# Patient Record
Sex: Male | Born: 1945
Health system: Southern US, Community
[De-identification: ages and names within clinical notes are randomized; demographics above are authoritative.]

## PROBLEM LIST (undated history)

## (undated) DIAGNOSIS — Z9889 Other specified postprocedural states: Secondary | ICD-10-CM

## (undated) DIAGNOSIS — I639 Cerebral infarction, unspecified: Secondary | ICD-10-CM

## (undated) DIAGNOSIS — I350 Nonrheumatic aortic (valve) stenosis: Secondary | ICD-10-CM

## (undated) DIAGNOSIS — E785 Hyperlipidemia, unspecified: Secondary | ICD-10-CM

## (undated) DIAGNOSIS — Z952 Presence of prosthetic heart valve: Secondary | ICD-10-CM

## (undated) DIAGNOSIS — I2510 Atherosclerotic heart disease of native coronary artery without angina pectoris: Secondary | ICD-10-CM

## (undated) DIAGNOSIS — R011 Cardiac murmur, unspecified: Secondary | ICD-10-CM

## (undated) DIAGNOSIS — Z8619 Personal history of other infectious and parasitic diseases: Secondary | ICD-10-CM

## (undated) DIAGNOSIS — T8859XA Other complications of anesthesia, initial encounter: Secondary | ICD-10-CM

## (undated) DIAGNOSIS — R112 Nausea with vomiting, unspecified: Secondary | ICD-10-CM

## (undated) DIAGNOSIS — T7840XA Allergy, unspecified, initial encounter: Secondary | ICD-10-CM

## (undated) DIAGNOSIS — M1990 Unspecified osteoarthritis, unspecified site: Secondary | ICD-10-CM

## (undated) DIAGNOSIS — E1165 Type 2 diabetes mellitus with hyperglycemia: Secondary | ICD-10-CM

## (undated) DIAGNOSIS — N529 Male erectile dysfunction, unspecified: Secondary | ICD-10-CM

## (undated) DIAGNOSIS — C801 Malignant (primary) neoplasm, unspecified: Secondary | ICD-10-CM

## (undated) DIAGNOSIS — N189 Chronic kidney disease, unspecified: Secondary | ICD-10-CM

## (undated) DIAGNOSIS — E119 Type 2 diabetes mellitus without complications: Secondary | ICD-10-CM

## (undated) DIAGNOSIS — I10 Essential (primary) hypertension: Secondary | ICD-10-CM

## (undated) DIAGNOSIS — G4730 Sleep apnea, unspecified: Secondary | ICD-10-CM

## (undated) DIAGNOSIS — E669 Obesity, unspecified: Secondary | ICD-10-CM

## (undated) DIAGNOSIS — Z8739 Personal history of other diseases of the musculoskeletal system and connective tissue: Secondary | ICD-10-CM

## (undated) HISTORY — DX: Personal history of other infectious and parasitic diseases: Z86.19

## (undated) HISTORY — DX: Malignant (primary) neoplasm, unspecified: C80.1

## (undated) HISTORY — DX: Cardiac murmur, unspecified: R01.1

## (undated) HISTORY — DX: Atherosclerotic heart disease of native coronary artery without angina pectoris: I25.10

## (undated) HISTORY — DX: Hyperlipidemia, unspecified: E78.5

## (undated) HISTORY — DX: Allergy, unspecified, initial encounter: T78.40XA

## (undated) HISTORY — DX: Type 2 diabetes mellitus with hyperglycemia: E11.65

## (undated) HISTORY — DX: Type 2 diabetes mellitus without complications: E11.9

## (undated) HISTORY — PX: TONSILLECTOMY: SHX5217

## (undated) HISTORY — DX: Obesity, unspecified: E66.9

## (undated) HISTORY — DX: Nonrheumatic aortic (valve) stenosis: I35.0

## (undated) HISTORY — DX: Hemochromatosis, unspecified: E83.119

## (undated) HISTORY — DX: Presence of prosthetic heart valve: Z95.2

## (undated) HISTORY — DX: Essential (primary) hypertension: I10

## (undated) HISTORY — DX: Personal history of other diseases of the musculoskeletal system and connective tissue: Z87.39

## (undated) HISTORY — DX: Male erectile dysfunction, unspecified: N52.9

---
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
## 1972-01-31 DIAGNOSIS — Z8619 Personal history of other infectious and parasitic diseases: Secondary | ICD-10-CM

## 1972-01-31 HISTORY — DX: Personal history of other infectious and parasitic diseases: Z86.19

## 1988-01-31 HISTORY — PX: CHOLECYSTECTOMY: SHX55

## 1988-01-31 HISTORY — PX: APPENDECTOMY: SHX54

## 1997-01-30 HISTORY — PX: HERNIA REPAIR: SHX51

## 2000-01-31 HISTORY — PX: HERNIA REPAIR: SHX51

## 2003-10-14 ENCOUNTER — Encounter: Payer: Self-pay | Admitting: Cardiology

## 2003-10-22 ENCOUNTER — Ambulatory Visit: Payer: Self-pay | Admitting: Cardiology

## 2003-10-31 HISTORY — PX: CORONARY ARTERY BYPASS GRAFT: SHX141

## 2003-11-02 ENCOUNTER — Encounter: Admission: RE | Admit: 2003-11-02 | Discharge: 2003-11-02 | Payer: Self-pay | Admitting: Dentistry

## 2003-11-02 ENCOUNTER — Ambulatory Visit: Payer: Self-pay | Admitting: Dentistry

## 2003-11-05 ENCOUNTER — Ambulatory Visit (HOSPITAL_COMMUNITY): Admission: RE | Admit: 2003-11-05 | Discharge: 2003-11-05 | Payer: Self-pay | Admitting: Dentistry

## 2003-11-10 ENCOUNTER — Ambulatory Visit (HOSPITAL_COMMUNITY): Admission: RE | Admit: 2003-11-10 | Discharge: 2003-11-10 | Payer: Self-pay | Admitting: Cardiology

## 2003-11-10 ENCOUNTER — Encounter: Payer: Self-pay | Admitting: Cardiology

## 2003-11-11 ENCOUNTER — Encounter (INDEPENDENT_AMBULATORY_CARE_PROVIDER_SITE_OTHER): Payer: Self-pay | Admitting: Specialist

## 2003-11-11 ENCOUNTER — Inpatient Hospital Stay (HOSPITAL_COMMUNITY): Admission: RE | Admit: 2003-11-11 | Discharge: 2003-11-18 | Payer: Self-pay | Admitting: Cardiothoracic Surgery

## 2003-11-14 HISTORY — PX: AORTIC VALVE REPLACEMENT: SHX41

## 2003-11-27 ENCOUNTER — Inpatient Hospital Stay (HOSPITAL_COMMUNITY): Admission: EM | Admit: 2003-11-27 | Discharge: 2003-11-28 | Payer: Self-pay | Admitting: *Deleted

## 2003-11-29 ENCOUNTER — Emergency Department (HOSPITAL_COMMUNITY): Admission: EM | Admit: 2003-11-29 | Discharge: 2003-11-29 | Payer: Self-pay | Admitting: Emergency Medicine

## 2003-12-01 ENCOUNTER — Ambulatory Visit: Payer: Self-pay

## 2003-12-02 ENCOUNTER — Ambulatory Visit (HOSPITAL_COMMUNITY): Admission: RE | Admit: 2003-12-02 | Discharge: 2003-12-02 | Payer: Self-pay | Admitting: Cardiology

## 2003-12-02 ENCOUNTER — Ambulatory Visit: Payer: Self-pay | Admitting: *Deleted

## 2003-12-10 ENCOUNTER — Ambulatory Visit: Payer: Self-pay | Admitting: Cardiology

## 2003-12-21 ENCOUNTER — Ambulatory Visit: Payer: Self-pay

## 2003-12-21 ENCOUNTER — Ambulatory Visit: Payer: Self-pay | Admitting: Internal Medicine

## 2003-12-22 ENCOUNTER — Ambulatory Visit: Payer: Self-pay | Admitting: Internal Medicine

## 2003-12-28 ENCOUNTER — Ambulatory Visit: Payer: Self-pay | Admitting: Internal Medicine

## 2003-12-31 ENCOUNTER — Encounter: Admission: RE | Admit: 2003-12-31 | Discharge: 2003-12-31 | Payer: Self-pay | Admitting: Cardiothoracic Surgery

## 2004-01-04 ENCOUNTER — Ambulatory Visit: Payer: Self-pay | Admitting: Cardiology

## 2004-01-05 ENCOUNTER — Ambulatory Visit: Payer: Self-pay | Admitting: Internal Medicine

## 2004-01-05 ENCOUNTER — Encounter: Admission: RE | Admit: 2004-01-05 | Discharge: 2004-01-05 | Payer: Self-pay | Admitting: Internal Medicine

## 2004-01-07 ENCOUNTER — Ambulatory Visit: Payer: Self-pay | Admitting: Cardiology

## 2004-01-11 ENCOUNTER — Ambulatory Visit: Payer: Self-pay | Admitting: Cardiology

## 2004-01-20 ENCOUNTER — Ambulatory Visit: Payer: Self-pay | Admitting: *Deleted

## 2004-01-31 HISTORY — PX: HIP SURGERY: SHX245

## 2004-02-04 ENCOUNTER — Ambulatory Visit: Payer: Self-pay | Admitting: Internal Medicine

## 2004-02-11 ENCOUNTER — Ambulatory Visit: Payer: Self-pay | Admitting: Internal Medicine

## 2004-02-24 ENCOUNTER — Ambulatory Visit: Payer: Self-pay | Admitting: Dentistry

## 2004-02-25 ENCOUNTER — Ambulatory Visit: Payer: Self-pay | Admitting: Cardiology

## 2004-02-29 ENCOUNTER — Ambulatory Visit: Payer: Self-pay | Admitting: Internal Medicine

## 2004-03-14 ENCOUNTER — Ambulatory Visit: Payer: Self-pay | Admitting: Cardiology

## 2004-03-17 ENCOUNTER — Ambulatory Visit: Payer: Self-pay | Admitting: Internal Medicine

## 2004-03-24 ENCOUNTER — Ambulatory Visit (HOSPITAL_COMMUNITY): Admission: RE | Admit: 2004-03-24 | Discharge: 2004-03-24 | Payer: Self-pay | Admitting: Orthopedic Surgery

## 2004-05-30 ENCOUNTER — Ambulatory Visit: Payer: Self-pay | Admitting: Cardiology

## 2004-06-01 ENCOUNTER — Ambulatory Visit (HOSPITAL_COMMUNITY): Admission: RE | Admit: 2004-06-01 | Discharge: 2004-06-01 | Payer: Self-pay | Admitting: Orthopedic Surgery

## 2004-06-09 ENCOUNTER — Ambulatory Visit: Payer: Self-pay | Admitting: Internal Medicine

## 2004-06-20 ENCOUNTER — Ambulatory Visit: Payer: Self-pay | Admitting: Cardiology

## 2004-06-29 ENCOUNTER — Ambulatory Visit: Payer: Self-pay | Admitting: Cardiology

## 2004-07-13 ENCOUNTER — Ambulatory Visit: Payer: Self-pay | Admitting: *Deleted

## 2004-07-22 ENCOUNTER — Ambulatory Visit: Payer: Self-pay | Admitting: Cardiology

## 2004-07-25 ENCOUNTER — Ambulatory Visit: Payer: Self-pay | Admitting: Internal Medicine

## 2004-08-10 ENCOUNTER — Ambulatory Visit: Payer: Self-pay | Admitting: Cardiology

## 2004-08-31 ENCOUNTER — Ambulatory Visit: Payer: Self-pay | Admitting: *Deleted

## 2004-09-07 ENCOUNTER — Ambulatory Visit: Payer: Self-pay | Admitting: Cardiology

## 2004-09-15 ENCOUNTER — Ambulatory Visit: Payer: Self-pay | Admitting: Internal Medicine

## 2004-09-15 ENCOUNTER — Ambulatory Visit: Payer: Self-pay | Admitting: Cardiology

## 2004-10-06 ENCOUNTER — Ambulatory Visit: Payer: Self-pay | Admitting: Internal Medicine

## 2004-11-03 ENCOUNTER — Ambulatory Visit: Payer: Self-pay | Admitting: Cardiology

## 2004-11-04 ENCOUNTER — Ambulatory Visit: Payer: Self-pay | Admitting: Cardiology

## 2004-12-07 ENCOUNTER — Ambulatory Visit: Payer: Self-pay | Admitting: Cardiology

## 2004-12-28 ENCOUNTER — Ambulatory Visit: Payer: Self-pay | Admitting: Cardiology

## 2005-01-05 ENCOUNTER — Ambulatory Visit: Payer: Self-pay | Admitting: Cardiology

## 2005-01-18 ENCOUNTER — Ambulatory Visit: Payer: Self-pay | Admitting: Internal Medicine

## 2005-02-01 ENCOUNTER — Ambulatory Visit: Payer: Self-pay | Admitting: Cardiology

## 2005-02-15 ENCOUNTER — Ambulatory Visit: Payer: Self-pay | Admitting: Cardiology

## 2005-03-02 ENCOUNTER — Ambulatory Visit: Payer: Self-pay | Admitting: Cardiology

## 2005-03-22 ENCOUNTER — Ambulatory Visit: Payer: Self-pay | Admitting: Cardiology

## 2005-03-29 ENCOUNTER — Ambulatory Visit: Payer: Self-pay | Admitting: Cardiology

## 2005-04-05 ENCOUNTER — Ambulatory Visit: Payer: Self-pay | Admitting: Cardiology

## 2005-04-26 ENCOUNTER — Ambulatory Visit: Payer: Self-pay | Admitting: *Deleted

## 2005-05-24 ENCOUNTER — Ambulatory Visit: Payer: Self-pay | Admitting: *Deleted

## 2005-06-21 ENCOUNTER — Ambulatory Visit: Payer: Self-pay | Admitting: Cardiology

## 2005-07-18 ENCOUNTER — Ambulatory Visit: Payer: Self-pay | Admitting: Cardiology

## 2005-08-15 ENCOUNTER — Ambulatory Visit: Payer: Self-pay | Admitting: Cardiology

## 2005-09-05 ENCOUNTER — Ambulatory Visit: Payer: Self-pay | Admitting: Cardiology

## 2005-10-03 ENCOUNTER — Ambulatory Visit: Payer: Self-pay | Admitting: Cardiovascular Disease

## 2005-10-30 ENCOUNTER — Ambulatory Visit: Payer: Self-pay | Admitting: Cardiology

## 2005-11-24 ENCOUNTER — Ambulatory Visit: Payer: Self-pay | Admitting: Internal Medicine

## 2005-12-25 ENCOUNTER — Ambulatory Visit: Payer: Self-pay | Admitting: Cardiology

## 2006-01-24 ENCOUNTER — Ambulatory Visit: Payer: Self-pay | Admitting: Cardiology

## 2006-03-02 ENCOUNTER — Ambulatory Visit: Payer: Self-pay | Admitting: Internal Medicine

## 2006-04-04 ENCOUNTER — Ambulatory Visit: Payer: Self-pay | Admitting: *Deleted

## 2006-04-04 ENCOUNTER — Ambulatory Visit: Payer: Self-pay | Admitting: Cardiology

## 2006-05-01 ENCOUNTER — Ambulatory Visit: Payer: Self-pay | Admitting: Internal Medicine

## 2006-05-01 ENCOUNTER — Ambulatory Visit: Payer: Self-pay | Admitting: Cardiology

## 2006-05-01 LAB — CONVERTED CEMR LAB
ALT: 34 units/L (ref 0–40)
AST: 32 units/L (ref 0–37)
Albumin: 4.1 g/dL (ref 3.5–5.2)
Alkaline Phosphatase: 55 units/L (ref 39–117)
Bilirubin, Direct: 0.1 mg/dL (ref 0.0–0.3)
Cholesterol: 127 mg/dL (ref 0–200)
Direct LDL: 70 mg/dL
HDL: 34.9 mg/dL — ABNORMAL LOW (ref >39.0)
Total Bilirubin: 0.8 mg/dL (ref 0.3–1.2)
Total CHOL/HDL Ratio: 3.6
Total Protein: 7.4 g/dL (ref 6.0–8.3)
Triglycerides: 263 mg/dL (ref 0–149)
VLDL: 53 mg/dL — ABNORMAL HIGH (ref 0–40)

## 2006-05-28 ENCOUNTER — Ambulatory Visit: Payer: Self-pay | Admitting: Cardiology

## 2006-06-26 ENCOUNTER — Ambulatory Visit: Payer: Self-pay | Admitting: Cardiology

## 2006-07-24 ENCOUNTER — Ambulatory Visit: Payer: Self-pay | Admitting: Cardiology

## 2006-08-14 ENCOUNTER — Ambulatory Visit: Payer: Self-pay | Admitting: Cardiovascular Disease

## 2006-09-06 ENCOUNTER — Ambulatory Visit: Payer: Self-pay | Admitting: Cardiovascular Disease

## 2006-10-03 ENCOUNTER — Ambulatory Visit: Payer: Self-pay | Admitting: Cardiology

## 2006-10-03 ENCOUNTER — Ambulatory Visit: Payer: Self-pay | Admitting: Internal Medicine

## 2006-10-15 ENCOUNTER — Ambulatory Visit: Payer: Self-pay | Admitting: Internal Medicine

## 2006-11-13 ENCOUNTER — Ambulatory Visit: Payer: Self-pay | Admitting: Cardiology

## 2006-12-10 ENCOUNTER — Ambulatory Visit: Payer: Self-pay | Admitting: Internal Medicine

## 2007-01-08 ENCOUNTER — Ambulatory Visit: Payer: Self-pay | Admitting: Cardiology

## 2007-02-05 ENCOUNTER — Ambulatory Visit: Payer: Self-pay | Admitting: Cardiology

## 2007-03-05 ENCOUNTER — Ambulatory Visit: Payer: Self-pay | Admitting: Cardiovascular Disease

## 2007-03-22 ENCOUNTER — Ambulatory Visit: Payer: Self-pay | Admitting: Cardiology

## 2007-04-19 ENCOUNTER — Ambulatory Visit: Payer: Self-pay | Admitting: Cardiovascular Disease

## 2007-05-15 ENCOUNTER — Ambulatory Visit: Payer: Self-pay | Admitting: Cardiology

## 2007-05-21 ENCOUNTER — Ambulatory Visit: Payer: Self-pay | Admitting: Cardiology

## 2007-05-21 LAB — CONVERTED CEMR LAB
Albumin: 4 g/dL (ref 3.5–5.2)
Bilirubin, Direct: 0.1 mg/dL (ref 0.0–0.3)
Calcium: 9.2 mg/dL (ref 8.4–10.5)
Creatinine, Ser: 1.2 mg/dL (ref 0.4–1.5)
GFR calc Af Amer: 79 mL/min
Glucose, Bld: 127 mg/dL — ABNORMAL HIGH (ref 70–99)
HDL: 28.7 mg/dL — ABNORMAL LOW (ref >39.0)
Sodium: 140 meq/L (ref 135–145)
Total Protein: 7.4 g/dL (ref 6.0–8.3)
VLDL: 39 mg/dL (ref 0–40)

## 2007-05-31 ENCOUNTER — Ambulatory Visit: Payer: Self-pay | Admitting: Internal Medicine

## 2007-06-03 ENCOUNTER — Ambulatory Visit: Payer: Self-pay | Admitting: Internal Medicine

## 2007-06-11 ENCOUNTER — Encounter: Admission: RE | Admit: 2007-06-11 | Discharge: 2007-06-11 | Payer: Self-pay | Admitting: Otolaryngology

## 2007-06-11 ENCOUNTER — Ambulatory Visit: Payer: Self-pay | Admitting: Cardiology

## 2007-06-17 ENCOUNTER — Ambulatory Visit: Payer: Self-pay | Admitting: Cardiology

## 2007-07-01 ENCOUNTER — Ambulatory Visit: Payer: Self-pay | Admitting: Cardiovascular Disease

## 2007-07-07 ENCOUNTER — Encounter: Payer: Self-pay | Admitting: Internal Medicine

## 2007-07-14 ENCOUNTER — Encounter: Payer: Self-pay | Admitting: Internal Medicine

## 2007-07-15 ENCOUNTER — Ambulatory Visit: Payer: Self-pay | Admitting: Internal Medicine

## 2007-07-22 ENCOUNTER — Ambulatory Visit: Payer: Self-pay | Admitting: Internal Medicine

## 2007-08-06 ENCOUNTER — Ambulatory Visit: Payer: Self-pay | Admitting: Cardiovascular Disease

## 2007-08-08 ENCOUNTER — Encounter: Payer: Self-pay | Admitting: Internal Medicine

## 2007-08-12 ENCOUNTER — Encounter: Payer: Self-pay | Admitting: Internal Medicine

## 2007-08-20 ENCOUNTER — Ambulatory Visit: Payer: Self-pay | Admitting: Cardiology

## 2007-09-06 ENCOUNTER — Ambulatory Visit: Payer: Self-pay | Admitting: Cardiology

## 2007-09-24 ENCOUNTER — Ambulatory Visit: Payer: Self-pay | Admitting: Cardiology

## 2007-10-02 ENCOUNTER — Encounter: Payer: Self-pay | Admitting: Internal Medicine

## 2007-10-08 ENCOUNTER — Ambulatory Visit: Payer: Self-pay | Admitting: Cardiology

## 2007-10-18 ENCOUNTER — Ambulatory Visit: Payer: Self-pay | Admitting: Internal Medicine

## 2007-10-29 ENCOUNTER — Ambulatory Visit: Payer: Self-pay | Admitting: Cardiology

## 2007-11-19 ENCOUNTER — Ambulatory Visit: Payer: Self-pay | Admitting: Internal Medicine

## 2007-12-17 ENCOUNTER — Ambulatory Visit: Payer: Self-pay | Admitting: Cardiovascular Disease

## 2008-01-14 ENCOUNTER — Ambulatory Visit: Payer: Self-pay | Admitting: Cardiology

## 2008-02-04 ENCOUNTER — Ambulatory Visit: Payer: Self-pay | Admitting: Internal Medicine

## 2008-02-28 ENCOUNTER — Ambulatory Visit: Payer: Self-pay | Admitting: Internal Medicine

## 2008-03-20 ENCOUNTER — Ambulatory Visit: Payer: Self-pay | Admitting: Internal Medicine

## 2008-04-17 ENCOUNTER — Ambulatory Visit: Payer: Self-pay | Admitting: Cardiology

## 2008-04-29 DIAGNOSIS — I25810 Atherosclerosis of coronary artery bypass graft(s) without angina pectoris: Secondary | ICD-10-CM

## 2008-04-29 DIAGNOSIS — I359 Nonrheumatic aortic valve disorder, unspecified: Secondary | ICD-10-CM | POA: Insufficient documentation

## 2008-04-29 DIAGNOSIS — E785 Hyperlipidemia, unspecified: Secondary | ICD-10-CM

## 2008-04-29 DIAGNOSIS — E669 Obesity, unspecified: Secondary | ICD-10-CM

## 2008-04-29 DIAGNOSIS — M109 Gout, unspecified: Secondary | ICD-10-CM | POA: Insufficient documentation

## 2008-04-29 HISTORY — DX: Obesity, unspecified: E66.9

## 2008-05-06 ENCOUNTER — Encounter: Payer: Self-pay | Admitting: Cardiology

## 2008-05-06 ENCOUNTER — Ambulatory Visit: Payer: Self-pay | Admitting: Cardiology

## 2008-05-06 ENCOUNTER — Ambulatory Visit: Payer: Self-pay | Admitting: Internal Medicine

## 2008-05-06 DIAGNOSIS — I421 Obstructive hypertrophic cardiomyopathy: Secondary | ICD-10-CM

## 2008-05-06 HISTORY — DX: Obstructive hypertrophic cardiomyopathy: I42.1

## 2008-05-27 ENCOUNTER — Ambulatory Visit: Payer: Self-pay | Admitting: Internal Medicine

## 2008-05-27 ENCOUNTER — Ambulatory Visit: Payer: Self-pay

## 2008-05-27 ENCOUNTER — Encounter: Payer: Self-pay | Admitting: Cardiology

## 2008-05-27 ENCOUNTER — Ambulatory Visit: Payer: Self-pay | Admitting: Cardiology

## 2008-06-02 LAB — CONVERTED CEMR LAB
Alkaline Phosphatase: 56 units/L (ref 39–117)
Bilirubin, Direct: 0.1 mg/dL (ref 0.0–0.3)
Direct LDL: 58 mg/dL
HDL: 30.3 mg/dL — ABNORMAL LOW (ref >39.00)
Total Bilirubin: 1 mg/dL (ref 0.3–1.2)
Total CHOL/HDL Ratio: 4
Total Protein: 6.9 g/dL (ref 6.0–8.3)
VLDL: 46.2 mg/dL — ABNORMAL HIGH (ref 0.0–40.0)

## 2008-06-24 ENCOUNTER — Ambulatory Visit: Payer: Self-pay | Admitting: Cardiology

## 2008-06-30 ENCOUNTER — Encounter: Payer: Self-pay | Admitting: *Deleted

## 2008-07-28 ENCOUNTER — Ambulatory Visit: Payer: Self-pay | Admitting: Internal Medicine

## 2008-07-28 LAB — CONVERTED CEMR LAB: POC INR: 2.6

## 2008-08-05 ENCOUNTER — Encounter: Payer: Self-pay | Admitting: *Deleted

## 2008-08-25 ENCOUNTER — Ambulatory Visit: Payer: Self-pay | Admitting: Internal Medicine

## 2008-09-22 ENCOUNTER — Ambulatory Visit: Payer: Self-pay | Admitting: Internal Medicine

## 2008-10-28 ENCOUNTER — Ambulatory Visit: Payer: Self-pay | Admitting: Cardiovascular Disease

## 2008-11-26 ENCOUNTER — Ambulatory Visit: Payer: Self-pay | Admitting: Cardiology

## 2008-11-26 LAB — CONVERTED CEMR LAB: POC INR: 2.7

## 2008-12-22 ENCOUNTER — Ambulatory Visit: Payer: Self-pay | Admitting: Cardiovascular Disease

## 2008-12-22 LAB — CONVERTED CEMR LAB: POC INR: 2.2

## 2009-01-19 ENCOUNTER — Ambulatory Visit: Payer: Self-pay | Admitting: Internal Medicine

## 2009-01-19 LAB — CONVERTED CEMR LAB: POC INR: 2.2

## 2009-01-30 DIAGNOSIS — C801 Malignant (primary) neoplasm, unspecified: Secondary | ICD-10-CM

## 2009-01-30 HISTORY — DX: Malignant (primary) neoplasm, unspecified: C80.1

## 2009-02-17 ENCOUNTER — Ambulatory Visit: Payer: Self-pay | Admitting: Cardiovascular Disease

## 2009-02-17 LAB — CONVERTED CEMR LAB: POC INR: 3

## 2009-03-09 ENCOUNTER — Telehealth (INDEPENDENT_AMBULATORY_CARE_PROVIDER_SITE_OTHER): Payer: Self-pay | Admitting: *Deleted

## 2009-03-17 ENCOUNTER — Ambulatory Visit: Payer: Self-pay | Admitting: Internal Medicine

## 2009-03-17 LAB — CONVERTED CEMR LAB: POC INR: 2.5

## 2009-04-14 ENCOUNTER — Ambulatory Visit: Payer: Self-pay

## 2009-04-14 LAB — CONVERTED CEMR LAB: POC INR: 2.7

## 2009-05-18 ENCOUNTER — Ambulatory Visit: Payer: Self-pay | Admitting: Cardiology

## 2009-05-18 ENCOUNTER — Ambulatory Visit: Payer: Self-pay | Admitting: Cardiovascular Disease

## 2009-05-18 DIAGNOSIS — N529 Male erectile dysfunction, unspecified: Secondary | ICD-10-CM

## 2009-05-18 HISTORY — DX: Male erectile dysfunction, unspecified: N52.9

## 2009-06-15 ENCOUNTER — Ambulatory Visit: Payer: Self-pay | Admitting: Cardiology

## 2009-06-15 DIAGNOSIS — I2510 Atherosclerotic heart disease of native coronary artery without angina pectoris: Secondary | ICD-10-CM

## 2009-06-15 HISTORY — DX: Atherosclerotic heart disease of native coronary artery without angina pectoris: I25.10

## 2009-06-15 LAB — CONVERTED CEMR LAB: POC INR: 2.2

## 2009-06-16 ENCOUNTER — Ambulatory Visit: Payer: Self-pay | Admitting: Internal Medicine

## 2009-06-16 ENCOUNTER — Encounter: Payer: Self-pay | Admitting: Family

## 2009-06-16 DIAGNOSIS — H903 Sensorineural hearing loss, bilateral: Secondary | ICD-10-CM

## 2009-06-16 DIAGNOSIS — I10 Essential (primary) hypertension: Secondary | ICD-10-CM

## 2009-06-16 DIAGNOSIS — F17200 Nicotine dependence, unspecified, uncomplicated: Secondary | ICD-10-CM

## 2009-06-16 DIAGNOSIS — H905 Unspecified sensorineural hearing loss: Secondary | ICD-10-CM | POA: Insufficient documentation

## 2009-06-16 HISTORY — DX: Nicotine dependence, unspecified, uncomplicated: F17.200

## 2009-06-16 HISTORY — DX: Sensorineural hearing loss, bilateral: H90.3

## 2009-06-16 HISTORY — DX: Essential (primary) hypertension: I10

## 2009-06-16 LAB — CONVERTED CEMR LAB
ALT: 46 units/L (ref 0–53)
AST: 56 units/L — ABNORMAL HIGH (ref 0–37)
Alkaline Phosphatase: 66 units/L (ref 39–117)
CO2: 26 meq/L (ref 19–32)
Creatinine, Ser: 1.19 mg/dL (ref 0.40–1.50)
Sodium: 135 meq/L (ref 135–145)
TSH: 2.251 microintl units/mL (ref 0.350–4.500)
Total Bilirubin: 0.6 mg/dL (ref 0.3–1.2)
Total Protein: 7.6 g/dL (ref 6.0–8.3)

## 2009-06-17 ENCOUNTER — Telehealth: Payer: Self-pay | Admitting: Family

## 2009-06-17 ENCOUNTER — Encounter (INDEPENDENT_AMBULATORY_CARE_PROVIDER_SITE_OTHER): Payer: Self-pay | Admitting: *Deleted

## 2009-06-17 DIAGNOSIS — C61 Malignant neoplasm of prostate: Secondary | ICD-10-CM

## 2009-06-17 HISTORY — DX: Malignant neoplasm of prostate: C61

## 2009-06-18 LAB — CONVERTED CEMR LAB
Basophils Absolute: 0.1 10*3/uL (ref 0.0–0.1)
Eosinophils Absolute: 0.2 10*3/uL (ref 0.0–0.7)
HCT: 45.9 % (ref 39.0–52.0)
Hemoglobin: 15.9 g/dL (ref 13.0–17.0)
Lymphs Abs: 1.4 10*3/uL (ref 0.7–4.0)
MCHC: 34.7 g/dL (ref 30.0–36.0)
Monocytes Absolute: 0.8 10*3/uL (ref 0.1–1.0)
Monocytes Relative: 11.4 % (ref 3.0–12.0)
Neutro Abs: 4.9 10*3/uL (ref 1.4–7.7)
Platelets: 160 10*3/uL (ref 150.0–400.0)
RDW: 13.6 % (ref 11.5–14.6)

## 2009-07-01 ENCOUNTER — Ambulatory Visit: Payer: Self-pay | Admitting: Family

## 2009-07-02 ENCOUNTER — Telehealth: Payer: Self-pay | Admitting: Family

## 2009-07-06 ENCOUNTER — Ambulatory Visit: Payer: Self-pay | Admitting: Family

## 2009-07-13 ENCOUNTER — Ambulatory Visit: Payer: Self-pay

## 2009-07-13 ENCOUNTER — Ambulatory Visit: Payer: Self-pay | Admitting: Cardiovascular Disease

## 2009-07-13 ENCOUNTER — Ambulatory Visit: Payer: Self-pay | Admitting: Cardiology

## 2009-07-13 ENCOUNTER — Ambulatory Visit: Payer: Self-pay | Admitting: Internal Medicine

## 2009-07-13 ENCOUNTER — Encounter: Payer: Self-pay | Admitting: Gastroenterology

## 2009-07-13 ENCOUNTER — Ambulatory Visit (HOSPITAL_COMMUNITY): Admission: RE | Admit: 2009-07-13 | Discharge: 2009-07-13 | Payer: Self-pay | Admitting: Cardiology

## 2009-07-13 ENCOUNTER — Encounter: Payer: Self-pay | Admitting: Cardiology

## 2009-07-13 LAB — CONVERTED CEMR LAB: POC INR: 2.4

## 2009-07-27 ENCOUNTER — Encounter: Payer: Self-pay | Admitting: Gastroenterology

## 2009-07-27 ENCOUNTER — Ambulatory Visit: Payer: Self-pay | Admitting: Gastroenterology

## 2009-07-28 ENCOUNTER — Encounter: Payer: Self-pay | Admitting: Gastroenterology

## 2009-07-28 ENCOUNTER — Encounter (INDEPENDENT_AMBULATORY_CARE_PROVIDER_SITE_OTHER): Payer: Self-pay | Admitting: *Deleted

## 2009-07-28 ENCOUNTER — Telehealth (INDEPENDENT_AMBULATORY_CARE_PROVIDER_SITE_OTHER): Payer: Self-pay | Admitting: *Deleted

## 2009-08-04 ENCOUNTER — Ambulatory Visit: Payer: Self-pay | Admitting: Family

## 2009-08-10 ENCOUNTER — Ambulatory Visit: Payer: Self-pay | Admitting: Cardiovascular Disease

## 2009-08-10 LAB — CONVERTED CEMR LAB: POC INR: 2.4

## 2009-09-07 ENCOUNTER — Ambulatory Visit: Payer: Self-pay | Admitting: Cardiology

## 2009-09-07 LAB — CONVERTED CEMR LAB: POC INR: 2.5

## 2009-09-24 ENCOUNTER — Encounter (INDEPENDENT_AMBULATORY_CARE_PROVIDER_SITE_OTHER): Payer: Self-pay

## 2009-09-28 ENCOUNTER — Ambulatory Visit: Payer: Self-pay | Admitting: Gastroenterology

## 2009-10-05 ENCOUNTER — Ambulatory Visit: Payer: Self-pay | Admitting: Gastroenterology

## 2009-10-05 ENCOUNTER — Encounter: Payer: Self-pay | Admitting: Cardiology

## 2009-10-05 ENCOUNTER — Encounter: Payer: Self-pay | Admitting: Family

## 2009-10-05 LAB — HM COLONOSCOPY

## 2009-10-06 ENCOUNTER — Telehealth: Payer: Self-pay | Admitting: Cardiovascular Disease

## 2009-10-07 ENCOUNTER — Ambulatory Visit: Payer: Self-pay | Admitting: Internal Medicine

## 2009-10-07 ENCOUNTER — Telehealth: Payer: Self-pay | Admitting: Gastroenterology

## 2009-10-07 LAB — CONVERTED CEMR LAB: POC INR: 1.1

## 2009-10-11 ENCOUNTER — Telehealth (INDEPENDENT_AMBULATORY_CARE_PROVIDER_SITE_OTHER): Payer: Self-pay | Admitting: *Deleted

## 2009-10-11 ENCOUNTER — Ambulatory Visit: Payer: Self-pay | Admitting: Internal Medicine

## 2009-10-11 ENCOUNTER — Encounter: Payer: Self-pay | Admitting: Family

## 2009-10-11 LAB — CONVERTED CEMR LAB: POC INR: 1.7

## 2009-10-12 ENCOUNTER — Ambulatory Visit: Payer: Self-pay | Admitting: Gastroenterology

## 2009-10-12 LAB — CONVERTED CEMR LAB
Basophils Absolute: 0 10*3/uL (ref 0.0–0.1)
Eosinophils Relative: 1.6 % (ref 0.0–5.0)
HCT: 36.8 % — ABNORMAL LOW (ref 39.0–52.0)
Lymphs Abs: 2.5 10*3/uL (ref 0.7–4.0)
Monocytes Relative: 6.6 % (ref 3.0–12.0)
Neutrophils Relative %: 67.7 % (ref 43.0–77.0)
Platelets: 188 10*3/uL (ref 150.0–400.0)
RDW: 13.4 % (ref 11.5–14.6)
WBC: 10.4 10*3/uL (ref 4.5–10.5)

## 2009-10-13 ENCOUNTER — Ambulatory Visit: Payer: Self-pay | Admitting: Cardiology

## 2009-10-13 ENCOUNTER — Ambulatory Visit: Payer: Self-pay | Admitting: Family

## 2009-10-13 DIAGNOSIS — Z952 Presence of prosthetic heart valve: Secondary | ICD-10-CM

## 2009-10-13 DIAGNOSIS — Z954 Presence of other heart-valve replacement: Secondary | ICD-10-CM

## 2009-10-13 HISTORY — DX: Presence of prosthetic heart valve: Z95.2

## 2009-10-13 LAB — CONVERTED CEMR LAB
Creatinine, Urine: 142.5 mg/dL
Hgb A1c MFr Bld: 6.9 % — ABNORMAL HIGH (ref <5.7)
POC INR: 1.9

## 2009-10-14 ENCOUNTER — Encounter: Payer: Self-pay | Admitting: Family

## 2009-10-18 ENCOUNTER — Telehealth: Payer: Self-pay | Admitting: Family

## 2009-10-26 ENCOUNTER — Telehealth: Payer: Self-pay | Admitting: Family

## 2009-11-01 ENCOUNTER — Ambulatory Visit (HOSPITAL_COMMUNITY): Admission: RE | Admit: 2009-11-01 | Discharge: 2009-11-01 | Payer: Self-pay | Admitting: Urology

## 2009-11-03 ENCOUNTER — Encounter: Payer: Self-pay | Admitting: Family

## 2009-11-03 ENCOUNTER — Encounter: Payer: Self-pay | Admitting: Cardiology

## 2009-11-04 ENCOUNTER — Ambulatory Visit: Payer: Self-pay | Admitting: Internal Medicine

## 2009-11-23 ENCOUNTER — Ambulatory Visit: Admission: RE | Admit: 2009-11-23 | Discharge: 2009-12-27 | Payer: Self-pay | Admitting: Radiation Oncology

## 2009-11-23 ENCOUNTER — Encounter: Payer: Self-pay | Admitting: Family

## 2009-11-25 ENCOUNTER — Encounter: Payer: Self-pay | Admitting: Cardiology

## 2009-11-25 ENCOUNTER — Encounter: Payer: Self-pay | Admitting: Family

## 2009-11-26 ENCOUNTER — Ambulatory Visit: Payer: Self-pay | Admitting: Family

## 2009-11-29 ENCOUNTER — Ambulatory Visit: Payer: Self-pay | Admitting: Cardiology

## 2009-12-02 ENCOUNTER — Telehealth: Payer: Self-pay | Admitting: Cardiology

## 2009-12-06 LAB — CONVERTED CEMR LAB
ALT: 33 units/L (ref 0–53)
AST: 30 units/L (ref 0–37)
Bilirubin, Direct: 0.1 mg/dL (ref 0.0–0.3)
Total Bilirubin: 0.9 mg/dL (ref 0.3–1.2)
Total Protein: 7.4 g/dL (ref 6.0–8.3)

## 2009-12-14 ENCOUNTER — Telehealth: Payer: Self-pay | Admitting: Cardiology

## 2009-12-17 ENCOUNTER — Ambulatory Visit: Payer: Self-pay | Admitting: Cardiology

## 2009-12-17 LAB — CONVERTED CEMR LAB: POC INR: 1.5

## 2009-12-22 ENCOUNTER — Ambulatory Visit: Payer: Self-pay | Admitting: Internal Medicine

## 2010-01-19 ENCOUNTER — Ambulatory Visit: Payer: Self-pay | Admitting: Cardiology

## 2010-02-03 ENCOUNTER — Ambulatory Visit
Admission: RE | Admit: 2010-02-03 | Discharge: 2010-03-01 | Payer: Self-pay | Source: Home / Self Care | Attending: Radiation Oncology | Admitting: Radiation Oncology

## 2010-02-04 ENCOUNTER — Encounter: Payer: Self-pay | Admitting: Cardiology

## 2010-02-14 ENCOUNTER — Telehealth: Payer: Self-pay | Admitting: Family

## 2010-02-16 ENCOUNTER — Ambulatory Visit: Admission: RE | Admit: 2010-02-16 | Discharge: 2010-02-16 | Payer: Self-pay | Source: Home / Self Care

## 2010-02-20 ENCOUNTER — Encounter: Payer: Self-pay | Admitting: Orthopedic Surgery

## 2010-02-20 ENCOUNTER — Encounter: Payer: Self-pay | Admitting: Otolaryngology

## 2010-02-27 LAB — CONVERTED CEMR LAB
ALT: 43 units/L (ref 0–53)
AST: 42 units/L — ABNORMAL HIGH (ref 0–37)
Basophils Absolute: 0 10*3/uL (ref 0.0–0.1)
Bilirubin, Direct: 0.1 mg/dL (ref 0.0–0.3)
Direct LDL: 55.9 mg/dL
Eosinophils Absolute: 0.1 10*3/uL (ref 0.0–0.7)
HDL: 38.7 mg/dL — ABNORMAL LOW (ref >39.00)
Lymphocytes Relative: 19.7 % (ref 12.0–46.0)
MCHC: 35.1 g/dL (ref 30.0–36.0)
MCV: 93.3 fL (ref 78.0–100.0)
Monocytes Absolute: 0.9 10*3/uL (ref 0.1–1.0)
Neutrophils Relative %: 71.1 % (ref 43.0–77.0)
RDW: 13.1 % (ref 11.5–14.6)
Total Bilirubin: 1 mg/dL (ref 0.3–1.2)
Total Protein: 7.8 g/dL (ref 6.0–8.3)
Triglycerides: 288 mg/dL — ABNORMAL HIGH (ref 0.0–149.0)

## 2010-03-02 ENCOUNTER — Ambulatory Visit: Admission: RE | Admit: 2010-03-02 | Payer: Medicare Other | Source: Ambulatory Visit | Admitting: Radiation Oncology

## 2010-03-02 DIAGNOSIS — C61 Malignant neoplasm of prostate: Secondary | ICD-10-CM | POA: Insufficient documentation

## 2010-03-02 DIAGNOSIS — R197 Diarrhea, unspecified: Secondary | ICD-10-CM | POA: Insufficient documentation

## 2010-03-02 DIAGNOSIS — Z510 Encounter for antineoplastic radiation therapy: Secondary | ICD-10-CM | POA: Insufficient documentation

## 2010-03-02 NOTE — Procedures (Signed)
Summary: Colonoscopy  Patient: Johanna Stafford Note: All result statuses are Final unless otherwise noted.  Tests: (1) Colonoscopy (COL)   COL Colonoscopy           DONE     [facility]     [address]     [place], [place]  [address]           COLONOSCOPY PROCEDURE REPORT     PATIENT:  Valor, Quaintance  MR#:  [id number]     BIRTHDATE:  01-08-1946, 64 yrs. old  GENDER:  male     ENDOSCOPIST:  Rachael Fee, MD     REF. BY:  Alma DownsLendell Caprice, N.P.     PROCEDURE DATE:  10/05/2009     PROCEDURE:  Diagnostic Colonoscopy     ASA CLASS:  Class II     INDICATIONS:  Routine Risk Screening     MEDICATIONS:   Fentanyl 75 mcg IV, Versed 8 mg IV     DESCRIPTION OF PROCEDURE:   After the risks benefits and     alternatives of the procedure were thoroughly explained, informed     consent was obtained.  Digital rectal exam was performed and     revealed no rectal masses.   The LB [id number] endoscope     was introduced through the anus and advanced to the cecum, which     was identified by both the appendix and ileocecal valve, without     limitations.  The quality of the prep was good, using MoviPrep.     The instrument was then slowly withdrawn as the colon was fully     examined.     <<PROCEDUREIMAGES>>     FINDINGS: There was a small amount of red blood in rectum (from     recent prostate biopsy) but no active oozing (see image4).     Internal and external hemorrhoids were found.  This was otherwise a     normal examination of the colon (see image1, image3, and image5).     Retroflexed views in the rectum revealed no abnormalities.    The     scope was then withdrawn from the patient and the procedure     completed.     COMPLICATIONS:  None           ENDOSCOPIC IMPRESSION:     1) Blood in rectum from recent prostate biopsy, but no active     oozing     2) Internal and external hemorrhoids     3) Otherwise normal examination; no polyps or cancer        RECOMMENDATIONS:     1) Continue current colorectal screening recommendations for     "routine risk" patients with a repeat colonoscopy in 10 years.     2) OK to resume full anticoagulation today from a GI perspective.     Dr. Christella Hartigan will communicate these findings with Dr. Riley Kill who has     managed his blood thinners for aortic valve.           REPEAT EXAM:  10 years           ______________________________     Rachael Fee, MD           cc: Dr. Bonnee Quin; Dr. Hillis Range           n.     eSIGNED:   Rachael Fee at 10/05/2009 01:49 PM  Brodrick, Curran, [id number]  Note: An exclamation mark (!) indicates a result that was not dispersed into the flowsheet. Document Creation Date: 10/05/2009 1:50 PM _______________________________________________________________________  (1) Order result status: Final Collection or observation date-time: 10/05/2009 13:43 Requested date-time:  Receipt date-time:  Reported date-time:  Referring Physician:   Ordering Physician: Rob Bunting [phone]) Specimen Source:  Source: Launa Grill Order Number: [id number] Lab site:   Appended Document: Colonoscopy    Clinical Lists Changes  Observations: Added new observation of COLONNXTDUE: 10/2019 (10/05/2009 15:28)

## 2010-03-02 NOTE — Assessment & Plan Note (Signed)
Summary: GLUCOMETER TEACHING / TF,CMA  Nurse Visit   Impression & Recommendations:  Problem # 1:  DM (ICD-250.00) Glucometer teaching provided by CMA, Pt instructions provided as below. His updated medication list for this problem includes:    Benicar Hct 20-12.5 Mg Tabs (Olmesartan medoxomil-hctz) .Marland Kitchen... Take 1 tablet by mouth once a day    Metformin Hcl 500 Mg Tabs (Metformin hcl) ..... One tablet by mouth daily x 1 week, then increase to one tablet by mouth twice daily  Complete Medication List: 1)  Toprol Xl 25 Mg Xr24h-tab (Metoprolol succinate) .... Take 1 tablet by mouth once a day 2)  Benicar Hct 20-12.5 Mg Tabs (Olmesartan medoxomil-hctz) .... Take 1 tablet by mouth once a day 3)  Niaspan 500 Mg Cr-tabs (Niacin (antihyperlipidemic)) .... Take 1 tablet by mouth once a day at bedtime 4)  Crestor 10 Mg Tabs (Rosuvastatin calcium) .... Take one tablet by mouth daily. 5)  Jantoven 5 Mg Tabs (Warfarin sodium) .... Take as directecd 6)  Viagra 50 Mg Tabs (Sildenafil citrate) .... Take one tablet as needed 7)  Colcrys 0.6 Mg Tabs (Colchicine) .... 2 tabs by mouth x 1 at start of gout flare, may take one tablet 1 hour later as needed 8)  Metformin Hcl 500 Mg Tabs (Metformin hcl) .... One tablet by mouth daily x 1 week, then increase to one tablet by mouth twice daily 9)  Freestyle Lite Test Strp (Glucose blood) .... Test blood sugar once a day. 10)  Freestyle Lancets Misc (Lancets) .... Check blood sugar once a day.   Patient Instructions: 1)  Check sugars at least once a day. 2)  Keep a log of your sugars in a notebook along with the date and time. Bring this log to your next appointment. 3)  Check your sugar if you develops symptoms of low blood sugar (weak, sweaty, dizzy)  4)  If you develop sugar under 80 drink a glass of OJ or have a snack and recheck your sugar in 30 minutes.   5)  Goal sugars fasting are between 80 and 110, goal sugars after meals are under 150. 6)  Call us if  you have sugars less than 80 or over 300. 7)  Follow up in July as scheduled.   Allergies: No Known Drug Allergies  Orders Added: 1)  Est. Patient Level II [98119] Prescriptions: FREESTYLE LANCETS  MISC (LANCETS) Check blood sugar once a day.  #100 x 0   Entered by:   Mervin Kung CMA   Authorized by:   [name] FNP   Signed by:   [name] FNP on 07/06/2009   Method used:   Electronically to        [facility] (retail)       [address]       [place], [place]  [address]       Ph: [phone]       Fax: [phone]   RxID:   [id number] FREESTYLE LITE TEST  STRP (GLUCOSE BLOOD) Test blood sugar once a day.  #50 x 1   Entered by:   Mervin Kung CMA   Authorized by:   [name] FNP   Signed by:   [name] FNP on 07/06/2009   Method used:   Electronically to        [facility] (retail)       [address]       [place], [place]  [address]       Ph: [phone]       Fax: [phone]   RxID:   [id number]   CC: Pt here for glucometer teaching. Instructed pt on use of Freestyle Lite glucometer. Pt demonstrates understanding.  I discussed BS goals and guidelines per Song Garris's instructions and copy was given to pt. Will send rxs to pharmacy for strips and lancets. Pt. will keep follow up in 1 month.

## 2010-03-02 NOTE — Letter (Signed)
Summary: [facility] / [facility]  [facility] on [facility]s / [facility]   Imported By: Lennie Odor 08/16/2009 14:33:29  _____________________________________________________________________  External Attachment:    Type:   Image     Comment:   External Document

## 2010-03-02 NOTE — Letter (Signed)
Summary: [facility]  [facility]   Imported By: Lanelle Bal 12/17/2009 10:37:38  _____________________________________________________________________  External Attachment:    Type:   Image     Comment:   External Document

## 2010-03-02 NOTE — Progress Notes (Signed)
Summary: lab results  Phone Note Outgoing Call   Summary of Call: Pls let patient know that his A1C is 7.5- this is high.  A diagnosis of DM is 6.5 or higher- so he does have diabetes.  I would like to have him start metformin, and come in for a nurse visit to learn how to use a meter.  He should come in for an appointment in 1 month.   Initial call taken by: [name] FNP,  July 02, 2009 9:47 PM  Follow-up for Phone Call        Left message with pt's son to have pt return my call. Mervin Kung CMA  July 05, 2009 10:15 AM   Pt informed of results per [facility] instructions.  Nurse visit set for 07/06/09 @ 11:30am.  33mf/u scheduled for 08/04/09/ @ 8:15 am.  Mervin Kung CMA  July 05, 2009 11:10 AM     New/Updated Medications: METFORMIN HCL 500 MG TABS (METFORMIN HCL) one tablet by mouth daily x 1 week, then increase to one tablet by mouth twice daily Prescriptions: METFORMIN HCL 500 MG TABS (METFORMIN HCL) one tablet by mouth daily x 1 week, then increase to one tablet by mouth twice daily  #60 x 1   Entered and Authorized by:   [name] FNP   Signed by:   [name] FNP on 07/02/2009   Method used:   Electronically to        [facility] (retail)       [address]       [place], [place]  [address]       Ph: [phone]       Fax: [phone]   RxID:   [id number]

## 2010-03-02 NOTE — Progress Notes (Signed)
Summary: Coumadin instructions after procedure  Phone Note Outgoing Call   Call placed by: Weston Brass PharmD,  December 14, 2009 4:58 PM Call placed to: Patient Summary of Call: Called and spoke with pt.  He has had very little bleeding since seed implants this AM.  Okay with MD to restart anticoagulation today.  Plan:  Coumadin 10mg  x 2 days then resume normal dose.  Restart Lovenox 150mg  once daily on 11/16.  F/u INR on 11/18.  Initial call taken by: Weston Brass PharmD,  December 14, 2009 5:02 PM    Prescriptions: ENOXAPARIN SODIUM 150 MG/ML SOLN (ENOXAPARIN SODIUM) Inject 1 syringe subcutaneously once daily as directed  #6 x 0   Entered by:   Weston Brass PharmD   Authorized by:   Ronaldo Miyamoto, MD, [facility]   Signed by:   Weston Brass PharmD on 12/14/2009   Method used:   Electronically to        [facility] (retail)       [address]       [place], [place]  [address]       Ph: [phone]       Fax: [phone]   RxID:   [id number]

## 2010-03-02 NOTE — Progress Notes (Signed)
Summary: Blood Pressure readings  Phone Note Call from Patient Call back at [phone]   Caller: Patient Call For: [name] FNP Summary of Call: patient calling to report his blood pressure readings,  9/25- 162/88 p 80  157/88 p 75 9/26 175/91 p 78 155/88 p 76 9/27 152/81 p78.  He would like to know if he should go to a whole pill. He states that it is okay to leave a detailed mesage if there is no  answer. Initial call taken by: Glendell Docker CMA,  October 26, 2009 8:40 AM  Follow-up for Phone Call        Yes, he should start full tablet of Benicar HCT.  Follow up in the office in 1 month. Follow-up by: [name] FNP,  October 26, 2009 10:51 AM  Additional Follow-up for Phone Call Additional follow up Details #1::        Left detailed message on pt's cell re: [name]'s instructions. Appt has been scheduled for 11/24/09 @ 8:30. Advised pt to call if he needs to r/s appt.  Appt reminder was mailed to pt. Nicki Guadalajara Fergerson CMA [name]  October 26, 2009 11:50 AM     New/Updated Medications: BENICAR HCT 20-12.5 MG TABS (OLMESARTAN MEDOXOMIL-HCTZ) Take 1 tablet by mouth once a day

## 2010-03-02 NOTE — Progress Notes (Signed)
Summary: rectal bleeding  Phone Note Call from Patient   Caller: Patient Summary of Call: Pt is calling because he is having BRB per rectum since colon, was clear drainage now blood independant of stool.  Feels weak.  No extender appt available.  Pt scheduled with Dr Christella Hartigan on 10/12/09  CBC to be done just before Initial call taken by: Chales Abrahams CMA [name],  October 11, 2009 12:41 PM

## 2010-03-02 NOTE — Letter (Signed)
Summary: [facility] Instructions  [facility]  [address] [place], [place] [address]   Phone: [phone]  Fax: [phone]       [name]    07/11/1958    MRN: [id number]        Procedure Day /Date:  Tuesday 10/05/2009     Arrival Time:  12:30 pm      Procedure Time: 1:30 pm     Location of Procedure:                    _ x_  [facility] (4th Floor)                        PREPARATION FOR COLONOSCOPY WITH MOVIPREP   Starting 5 days prior to your procedure Thursday 9/1 do not eat nuts, seeds, popcorn, corn, beans, peas,  salads, or any raw vegetables.  Do not take any fiber supplements (e.g. Metamucil, Citrucel, and Benefiber).  THE DAY BEFORE YOUR PROCEDURE         DATE: Monday 9/5  1.  Drink clear liquids the entire day-NO SOLID FOOD  2.  Do not drink anything colored red or purple.  Avoid juices with pulp.  No orange juice.  3.  Drink at least 64 oz. (8 glasses) of fluid/clear liquids during the day to prevent dehydration and help the prep work efficiently.  CLEAR LIQUIDS INCLUDE: Water Jello Ice Popsicles Tea (sugar ok, no milk/cream) Powdered fruit flavored drinks Coffee (sugar ok, no milk/cream) Gatorade Juice: apple, white grape, white cranberry  Lemonade Clear bullion, consomm, broth Carbonated beverages (any kind) Strained chicken noodle soup Hard Candy                             4.  In the morning, mix first dose of MoviPrep solution:    Empty 1 Pouch A and 1 Pouch B into the disposable container    Add lukewarm drinking water to the top line of the container. Mix to dissolve    Refrigerate (mixed solution should be used within 24 hrs)  5.  Begin drinking the prep at 5:00 p.m. The MoviPrep container is divided by 4 marks.   Every 15 minutes drink the solution down to the next mark (approximately 8 oz) until the full liter is complete.   6.  Follow completed prep with 16 oz of clear liquid of your choice (Nothing red  or purple).  Continue to drink clear liquids until bedtime.  7.  Before going to bed, mix second dose of MoviPrep solution:    Empty 1 Pouch A and 1 Pouch B into the disposable container    Add lukewarm drinking water to the top line of the container. Mix to dissolve    Refrigerate  THE DAY OF YOUR PROCEDURE      DATE: Tuesday 9/6  Beginning at 5:00 a.m. (5 hours before procedure):         1. Every 15 minutes, drink the solution down to the next mark (approx 8 oz) until the full liter is complete.  2. Follow completed prep with 16 oz. of clear liquid of your choice.    3. You may drink clear liquids until 11:30 am (2 HOURS BEFORE PROCEDURE).   MEDICATION INSTRUCTIONS  Unless otherwise instructed, you should take regular prescription medications with a small sip of water   as early as possible the morning  of your procedure. Diabetic patients - see separate instructions.      Stop taking Coumadin on  _ _  (5 days before procedure).Pt to take last dose 09/29/09  Additional medication instructions: Pt to take  Lovenox 150mg   on 10/03/09 and 10/04/09 per Dr Riley Kill.         OTHER INSTRUCTIONS  You will need a responsible adult at least 65 years of age to accompany you and drive you home.   This person must remain in the waiting room during your procedure.  Wear loose fitting clothing that is easily removed.  Leave jewelry and other valuables at home.  However, you may wish to bring a book to read or  an iPod/MP3 player to listen to music as you wait for your procedure to start.  Remove all body piercing jewelry and leave at home.  Total time from sign-in until discharge is approximately 2-3 hours.  You should go home directly after your procedure and rest.  You can resume normal activities the  day after your procedure.  The day of your procedure you should not:   Drive   Make legal decisions   Operate machinery   Drink alcohol   Return to work  You will  receive specific instructions about eating, activities and medications before you leave.    The above instructions have been reviewed and explained to me by   Ulis Rias RN  September 28, 2009 10:08 AM     I fully understand and can verbalize these instructions _____________________________ Date _________

## 2010-03-02 NOTE — Medication Information (Signed)
Summary: rov/sp  Anticoagulant Therapy  Managed by: Weston Brass, PharmD Referring MD: Shawnie Pons MD Supervising MD: Clifton James MD,Christopher Indication 1: Aortic Valve Replacement (ICD-V43.3) Indication 2: Aortic Valve Disorder (ICD-424.1) Lab Used: [facility] Site: [facility] INR POC 2.4 INR RANGE 2 - 3  Dietary changes: no    Health status changes: no    Bleeding/hemorrhagic complications: no    Recent/future hospitalizations: yes       Details: colonoscopy and prostate biopsy on 9/6  Any changes in medication regimen? no    Recent/future dental: no  Any missed doses?: no       Is patient compliant with meds? yes       Allergies: No Known Drug Allergies  Anticoagulation Management History:      The patient is taking warfarin and comes in today for a routine follow up visit.  Positive risk factors for bleeding include presence of serious comorbidities.  Negative risk factors for bleeding include an age less than 10 years old.  The bleeding index is 'intermediate risk'.  Positive CHADS2 values include History of HTN and History of Diabetes.  Negative CHADS2 values include Age > 44 years old.  The start date was 11/11/2003.  Anticoagulation responsible provider: Clifton James MD,Christopher.  INR POC: 2.4.  Cuvette Lot#: [id number].  Exp: 10/2010.    Anticoagulation Management Assessment/Plan:      The patient's current anticoagulation dose is Jantoven 5 mg tabs: Take as directecd.  The target INR is 2 - 3.  The next INR is due 09/07/2009.  Anticoagulation instructions were given to patient.  Results were reviewed/authorized by Weston Brass, PharmD.  He was notified by Dillard Cannon.         Prior Anticoagulation Instructions: INR 2.4  Continue same dose of Coumadin.  Will f/u with colonoscopy instructions. Recheck INR in 4 weeks.  Pt instructed to call once colonoscopy and biopsy schedule.d   Current Anticoagulation Instructions: INR 2.4  Continue same regimen of 1.5 tabs  on Monday and 1 tab all other days.  Re-check INR in 4 weeks.

## 2010-03-02 NOTE — Medication Information (Signed)
Summary: rov/ewj  Anticoagulant Therapy  Managed by: Cloyde Reams, RN, BSN Referring MD: Shawnie Pons MD Supervising MD: Eden Emms MD, Theron Arista Indication 1: Aortic Valve Replacement (ICD-V43.3) Indication 2: Aortic Valve Disorder (ICD-424.1) Lab Used: [facility] Site: [facility] INR POC 2.9 INR RANGE 2 - 3  Dietary changes: yes       Details: Eating less vitamin K  Health status changes: no    Bleeding/hemorrhagic complications: no    Recent/future hospitalizations: no    Any changes in medication regimen? no    Recent/future dental: no  Any missed doses?: no       Is patient compliant with meds? yes       Allergies (verified): No Known Drug Allergies  Anticoagulation Management History:      The patient is taking warfarin and comes in today for a routine follow up visit.  Negative risk factors for bleeding include an age less than 50 years old.  The bleeding index is 'low risk'.  Negative CHADS2 values include Age > 44 years old.  The start date was 11/11/2003.  Anticoagulation responsible provider: Eden Emms MD, Theron Arista.  INR POC: 2.9.  Cuvette Lot#: [id number].  Exp: 07/2010.    Anticoagulation Management Assessment/Plan:      The patient's current anticoagulation dose is Warfarin sodium 5 mg tabs: Use as directed by Anticoagulation Clinic.  The target INR is 2 - 3.  The next INR is due 06/15/2009.  Anticoagulation instructions were given to patient.  Results were reviewed/authorized by Cloyde Reams, RN, BSN.  He was notified by Cloyde Reams RN.         Prior Anticoagulation Instructions: INR 2.7  Continue on same dosage 1 tablet daily except 1.5 tablets on Mondays.  Recheck in 4 weeks.    Current Anticoagulation Instructions: INR 2.9  Continue on same dosage 1 tablet daily except 1.5 tablets on Mondays.  Recheck in 4 weeks.

## 2010-03-02 NOTE — Medication Information (Signed)
Summary: rov/nb  Anticoagulant Therapy  Managed by: Weston Brass, PharmD Referring MD: Shawnie Pons MD PCP: [name] FNP Supervising MD: Gala Romney MD, Reuel Boom Indication 1: Aortic Valve Replacement (ICD-V43.3) Indication 2: Aortic Valve Disorder (ICD-424.1) Lab Used: [facility] Site: [facility] INR POC 2.1 INR RANGE 2 - 3  Dietary changes: no    Health status changes: no    Bleeding/hemorrhagic complications: yes       Details: large bruise from lovenox injection  Recent/future hospitalizations: no    Any changes in medication regimen? no    Recent/future dental: no  Any missed doses?: no       Is patient compliant with meds? yes       Allergies: No Known Drug Allergies  Anticoagulation Management History:      The patient is taking warfarin and comes in today for a routine follow up visit.  Positive risk factors for bleeding include presence of serious comorbidities.  Negative risk factors for bleeding include an age less than 24 years old.  The bleeding index is 'intermediate risk'.  Positive CHADS2 values include History of HTN and History of Diabetes.  Negative CHADS2 values include Age > 3 years old.  The start date was 11/11/2003.  Anticoagulation responsible provider: Bensimhon MD, Reuel Boom.  INR POC: 2.1.  Cuvette Lot#: [id number].  Exp: 12/2010.    Anticoagulation Management Assessment/Plan:      The patient's current anticoagulation dose is Jantoven 5 mg tabs: Take as directecd.  The target INR is 2 - 3.  The next INR is due 01/19/2010.  Anticoagulation instructions were given to patient.  Results were reviewed/authorized by Weston Brass, PharmD.  He was notified by Weston Brass PharmD.         Prior Anticoagulation Instructions: INR 1.5 Continue Lovenox shots daily until Monday, Take 2 tablets today then resume  previous dose of 1 tablet everyday except 1.5 tablets on Monday Recheck INR in 1 week  Current Anticoagulation Instructions: INR  2.1  Continue same dose of 1 tablet every day except 1 1/2 tablets on Monday.  Recheck INR in 4 weeks.

## 2010-03-02 NOTE — Assessment & Plan Note (Signed)
History of Present Illness Primary GI MD: Rob Bunting MD Requesting Provider: Filbert Berthold, MD History of Present Illness:     very pleasant 65 year old man who had a colonoscoy many years ago.  Nothing was noted, this was done in [place].    He has an artificial  aortic valve placed in 2005 and for this he has been on Coumadin.  He held it for a hip surgery in 2006.   he was recently seen by Dr. Normajean Baxter, urology, who is planning to do prostate ultrasound and biopsy of prostate nodule in the setting of an elevated PSA level.  no GI symptoms (specifically no bleeding, no constipation, no diarrhea).  OVerall he has lost some weight slowly, intentionally.           Current Medications (verified): 1)  Toprol Xl 25 Mg Xr24h-Tab (Metoprolol Succinate) .... Take 1 Tablet By Mouth Once A Day 2)  Benicar Hct 20-12.5 Mg Tabs (Olmesartan Medoxomil-Hctz) .... Take 1 Tablet By Mouth Once A Day 3)  Niaspan 500 Mg Cr-Tabs (Niacin (Antihyperlipidemic)) .... Take 1 Tablet By Mouth Once A Day At Bedtime 4)  Crestor 10 Mg Tabs (Rosuvastatin Calcium) .... Take One Tablet By Mouth Daily. 5)  Jantoven 5 Mg Tabs (Warfarin Sodium) .... Take As Directecd 6)  Viagra 50 Mg Tabs (Sildenafil Citrate) .... Take One Tablet As Needed 7)  Colcrys 0.6 Mg Tabs (Colchicine) .... 2 Tabs By Mouth X 1 At Start of Gout Flare, May Take One Tablet 1 Hour Later As Needed 8)  Metformin Hcl 500 Mg Tabs (Metformin Hcl) .... One Tablet By Mouth Daily X 1 Week, Then Increase To One Tablet By Mouth Twice Daily 9)  Freestyle Lite Test  Strp (Glucose Blood) .... Test Blood Sugar Once A Day. 10)  Freestyle Lancets  Misc (Lancets) .... Check Blood Sugar Once A Day.  Allergies (verified): No Known Drug Allergies  Past History:  Past Medical History: oBESITY, MODERATE (ICD-278.00) HYPERCHOLESTEROLEMIA (ICD-272.0) AORTIC STENOSIS (ICD-424.1) CAD, ARTERY BYPASS GRAFT (ICD-414.04) GOUT, HX OF  (ICD-V12.2) Allergies heart murmur Hx of Hepatitis--1974 HTN  Past Surgical History: Aortic valve replacement [facility]. Coronary Artery bypass 10-2003 Rt. Hip surgery- 2006 Cholecystectomy-1990 Inguinal hernia (right) - 1999 Inguinal hernia (left) - 2002 pilonidal cyst- 1964 Tonsilectomy-child Appendectomy--1990s   Family History:  Notable for father died in his 78s from myocardial  infarction.  Mother in her 69s also from myocardial infarction, had TIAs.  A  brother is 10 years old, also has known coronary artery disease. 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 Married with two children Tobacco Use - Yes.  He smokes about 6 cigars a month Alcohol Use - yes-  Occasionally,  glass wine 4x a month    Review of Systems       Pertinent positive and negative review of systems were noted in the above HPI and GI specific review of systems.  All other review of systems was otherwise negative.   Vital Signs:  Patient profile:   65 year old male Height:      70 inches Weight:      229.25 pounds BMI:     33.01 Pulse rate:   64 / minute Pulse rhythm:   regular BP sitting:   124 / 76  (left arm) Cuff size:   regular  Vitals  Entered By: June McMurray CMA [name] (July 27, 2009 9:18 AM)  Physical Exam  Additional Exam:  Constitutional: generally well appearing Psychiatric: alert and oriented times 3 Eyes: extraocular movements intact Mouth: oropharynx moist, no lesions Neck: supple, no lymphadenopathy Cardiovascular: heart regular rate and rythm Lungs: CTA bilaterally Abdomen: soft, non-tender, non-distended, no obvious ascites, no peritoneal signs, normal bowel sounds Extremities: no lower extremity edema bilaterally Skin: no lesions on visible extremities    Impression &  Recommendations:  Problem # 1:  Routine risk for colon cancer, elevated risk for bleeding due to coumadin we will work with the urology office so that he can have a colonoscopy, prostate biopsy on the same day. This will allow him to have a short window off of Coumadin. He has already been told by the Coumadin clinic that he will need to be bridged with Lovenox and we will contact the Coumadin clinic as soon as the date of his procedures is set. I think it is best that he had the urology biopsy done first since sedation is not needed and it will therefore be easier for his wife and he to come over to our office outpatient facility and have a colonoscopy.  Patient Instructions: 1)  We will work with the coumadin clinic about getting you on lovenox shots prior to colonoscopy/urology prostate biopsy (date to be determined by office of Dr. Christella Hartigan' and Dahlstedt's office). 2)  You will be scheduled to have a colonoscopy. 3)  The medication list was reviewed and reconciled.  All changed / newly prescribed medications were explained.  A complete medication list was provided to the patient / caregiver.  Appended Document: problem update    Clinical Lists Changes  Problems: Added new problem of SPECIAL SCREENING FOR MALIGNANT NEOPLASMS COLON (ICD-V76.51) Orders: Added new Test order of Colonoscopy (Colon) - Signed

## 2010-03-02 NOTE — Medication Information (Signed)
Summary: rov/tm  Anticoagulant Therapy  Managed by: Weston Brass, PharmD Referring MD: Shawnie Pons MD Supervising MD: Myrtis Ser MD, Tinnie Gens Indication 1: Aortic Valve Replacement (ICD-V43.3) Indication 2: Aortic Valve Disorder (ICD-424.1) Lab Used: [facility] Site: [facility] INR POC 1.9 INR RANGE 2 - 3  Dietary changes: no    Health status changes: no    Bleeding/hemorrhagic complications: no    Recent/future hospitalizations: no    Any changes in medication regimen? no    Recent/future dental: no  Any missed doses?: no       Is patient compliant with meds? yes       Allergies: No Known Drug Allergies  Anticoagulation Management History:      The patient is taking warfarin and comes in today for a routine follow up visit.  Positive risk factors for bleeding include presence of serious comorbidities.  Negative risk factors for bleeding include an age less than 39 years old.  The bleeding index is 'intermediate risk'.  Positive CHADS2 values include History of HTN and History of Diabetes.  Negative CHADS2 values include Age > 90 years old.  The start date was 11/11/2003.  Anticoagulation responsible provider: Myrtis Ser MD, Tinnie Gens.  INR POC: 1.9.  Cuvette Lot#: [id number].  Exp: 12/2010.    Anticoagulation Management Assessment/Plan:      The patient's current anticoagulation dose is Jantoven 5 mg tabs: Take as directecd.  The target INR is 2 - 3.  The next INR is due 11/01/2009.  Anticoagulation instructions were given to patient.  Results were reviewed/authorized by Weston Brass, PharmD.  He was notified by Kennieth Francois.         Prior Anticoagulation Instructions: INR 1.7  Today resume regular dose,  5mg s everyday except 7.5mg s on Mondays. Restart Lovenox 100mg s every 12 hours take next injection tonight at 10pm. Injection given in office. Instructed pt to call Dr Christella Hartigan office and make them aware as well as I have already informed CMA.    Current Anticoagulation  Instructions: INR 1.9  Take one and one-half tablets today, then resume regular dose of one tablet every day except one and one-half tablets on Monday. Return to clinic on 11/01/09.

## 2010-03-02 NOTE — Assessment & Plan Note (Signed)
Review of gastrointestinal problems: 1. Routine risk for colon cancer: colonoscopy August, 2011 by Dr. Christella Hartigan found internal, external hemorrhoids. Blood in rectum from recent prostate biopsy ( earlier the same day), no polyps or cancers.  Recall colonoscopy at 10 year interval.    History of Present Illness Visit Type: Follow-up Visit Primary GI MD: Rob Bunting MD Requesting Provider: Filbert Berthold, MD Chief Complaint: follow-up colonoscopy BRB days after has stopped  past 2 days History of Present Illness:     very pleasant 65 year old man who is here with his son today. He underwent a colonoscopy last week.  he was fine for 4-5 days after the colonoscopy, then had bleeding on Sunday night.  He restarted coumadin on Thursday (was getting lovenox at same time) at higher doses than usual.    He had bright red blood several times on Sunday.  Had no bleeding yesterday and none today with BMs.  [place] INR was 1.7.  a CBC earlier today showed his hemoglobin is 12.9.           Current Medications (verified): 1)  Toprol Xl 25 Mg Xr24h-Tab (Metoprolol Succinate) .... Take 1 Tablet By Mouth Once A Day 2)  Benicar Hct 20-12.5 Mg Tabs (Olmesartan Medoxomil-Hctz) .... Take 1 Tablet By Mouth Once A Day 3)  Niaspan 500 Mg Cr-Tabs (Niacin (Antihyperlipidemic)) .... Take 1 Tablet By Mouth Once A Day At Bedtime 4)  Crestor 10 Mg Tabs (Rosuvastatin Calcium) .... Take One Tablet By Mouth Daily. 5)  Jantoven 5 Mg Tabs (Warfarin Sodium) .... Take As Directecd 6)  Colcrys 0.6 Mg Tabs (Colchicine) .... 2 Tabs By Mouth X 1 At Start of Gout Flare, May Take One Tablet 1 Hour Later As Needed 7)  Metformin Hcl 500 Mg Tabs (Metformin Hcl) .... One Tablet By Mouth Daily X 1 Week, Then Increase To One Tablet By Mouth Twice Daily 8)  Freestyle Lite Test  Strp (Glucose Blood) .... Test Blood Sugar Once A Day. 9)  Freestyle Lancets  Misc (Lancets) .... Check Blood Sugar Once A Day. 10)  Lovenox 100  Mg/ml Soln (Enoxaparin Sodium) .... Inject 100mgs Subcutaneously Every 12 Hours  Allergies (verified): No Known Drug Allergies  Vital Signs:  Patient profile:   64 year old male Height:      70 inches Weight:      224 pounds BMI:     32 .26 Pulse rate:   84 / minute Pulse rhythm:   regular BP sitting:   98 / 56  (left arm)  Vitals Entered By: Milford Cage NCMA (October 12, 2009 10:20 AM)  Physical Exam  Additional Exam:  Constitutional: generally well appearing Psychiatric: alert and oriented times 3 Abdomen: soft, non-tender, non-distended, normal bowel sounds    Impression & Recommendations:  Problem # 1:  Resolved rectal bleeding I suspect that he had a scab probably on one of the prostate needle biopsy sites that fell off over the weekend and since his blood is thin from Coumadin, Lovenox he had bleeding. I did not do any biopsies or removing polyps during the colonoscopy, however some minor scope, could potentially cause bleeding such as this as well. Either way his bleeding has completely stopped and he feels fine. His hemoglobin did not drop appreciably he is only barely anemic now. I told to call if he has a return of his rectal bleeding.  Patient Instructions: 1)  Call Dr. Christella Hartigan if bleeding returns.   2)  The medication list was reviewed and reconciled.  All changed / newly prescribed medications were explained.  A complete medication list was provided to the patient / caregiver.

## 2010-03-02 NOTE — Medication Information (Signed)
Summary: rov/tm  Anticoagulant Therapy  Managed by: Shelby Dubin, PharmD, BCPS, CPP Referring MD: Shawnie Pons MD Supervising MD: Myrtis Ser MD, Tinnie Gens Indication 1: Aortic Valve Replacement (ICD-V43.3) Indication 2: Aortic Valve Disorder (ICD-424.1) Lab Used: [facility] Site: [facility] INR POC 3.0 INR RANGE 2 - 3  Dietary changes: no    Health status changes: no    Bleeding/hemorrhagic complications: no    Recent/future hospitalizations: no    Any changes in medication regimen? no    Recent/future dental: no  Any missed doses?: no       Is patient compliant with meds? yes       Allergies (verified): No Known Drug Allergies  Anticoagulation Management History:      The patient is taking warfarin and comes in today for a routine follow up visit.  Negative risk factors for bleeding include an age less than 60 years old.  The bleeding index is 'low risk'.  Negative CHADS2 values include Age > 29 years old.  The start date was 11/11/2003.  Anticoagulation responsible provider: Myrtis Ser MD, Tinnie Gens.  INR POC: 3.0.  Cuvette Lot#: [id number].  Exp: 05/2010.    Anticoagulation Management Assessment/Plan:      The patient's current anticoagulation dose is Warfarin sodium 5 mg tabs: Use as directed by Anticoagulation Clinic.  The target INR is 2 - 3.  The next INR is due 03/17/2009.  Anticoagulation instructions were given to patient.  Results were reviewed/authorized by Shelby Dubin, PharmD, BCPS, CPP.         Prior Anticoagulation Instructions: INR 2.2 Continue 5mg s daily except 7.5mg s on Mondays. Recheck in 4 week.  Current Anticoagulation Instructions: INR: 3.0 Take 1/2 tablet today then resume to same dosage of 5mg  tablet daily except 7.5mg  on Mondays Recheck in 4 weeks

## 2010-03-02 NOTE — Letter (Signed)
Summary: [facility] Cath Report   [facility] Cath Report   Imported By: Roderic Ovens 10/05/2009 16:25:04  _____________________________________________________________________  External Attachment:    Type:   Image     Comment:   External Document

## 2010-03-02 NOTE — Letter (Signed)
Summary: [facility]  [facility]   Imported By: Lanelle Bal 11/22/2009 11:05:21  _____________________________________________________________________  External Attachment:    Type:   Image     Comment:   External Document

## 2010-03-02 NOTE — Letter (Signed)
Summary: Anticoagulation Modification Letter  [facility]  [address] [place], [place] [address]   Phone: [phone]  Fax: [phone]    July 28, 2009  Re:    [name] DOB:    22-Sep-1945 MRN:    [id number]    Dear Dr Riley Kill,  We have scheduled the above patient for an endoscopic procedure. Our records show that  he/she is on anticoagulation therapy. Please advise as to how long the patient may come off their therapy of coumadin prior to the scheduled procedure(s) on 10/05/09.  The patient will need Lovenox bridge.  Dr Christella Hartigan would like the patient off Lovenox 24 hours prior to the Colonoscopy.  The patient is also having a US Biopsy the same day  with Dr Retta Diones just before the Colon.   Please fax back/or route the completed form to Patty at [phone].  Thank you for your help with this matter.  Sincerely,  Chales Abrahams CMA [name]   Physician Recommendation:  Hold Plavix 7 days prior ________________  Hold Coumadin 5 days prior ____________  Other ______________________________     Appended Document: Anticoagulation Modification Letter response recieved from Dr Riley Kill pt is aware and letter scanned to EMR

## 2010-03-02 NOTE — Medication Information (Signed)
Summary: rov/sp  Anticoagulant Therapy  Managed by: Weston Brass, PharmD Referring MD: Shawnie Pons MD PCP: [name] FNP Supervising MD: Nahser Indication 1: Aortic Valve Replacement (ICD-V43.3) Indication 2: Aortic Valve Disorder (ICD-424.1) Lab Used: [facility] Site: [facility] INR POC 1.5 INR RANGE 2 - 3  Dietary changes: no    Health status changes: yes       Details: Had gold markers placed in prostate 11/15  Bleeding/hemorrhagic complications: no    Recent/future hospitalizations: no    Any changes in medication regimen? no    Recent/future dental: no  Any missed doses?: yes     Details: Held for procedure, see notes in comments  Is patient compliant with meds? yes      Comments: Had procedure on 15, restarted 15 take 2 on 11/15 & 11/16 started lovenox on 11/12. Has enough Lovenox for 11/21  Allergies: No Known Drug Allergies  Anticoagulation Management History:      The patient is taking warfarin and comes in today for a routine follow up visit.  Positive risk factors for bleeding include presence of serious comorbidities.  Negative risk factors for bleeding include an age less than 82 years old.  The bleeding index is 'intermediate risk'.  Positive CHADS2 values include History of HTN and History of Diabetes.  Negative CHADS2 values include Age > 38 years old.  The start date was 11/11/2003.  Anticoagulation responsible Shavaughn Seidl: Nahser.  INR POC: 1.5.  Cuvette Lot#: [id number].  Exp: 12/2010.    Anticoagulation Management Assessment/Plan:      The patient's current anticoagulation dose is Jantoven 5 mg tabs: Take as directecd.  The target INR is 2 - 3.  The next INR is due 12/22/2009.  Anticoagulation instructions were given to patient.  Results were reviewed/authorized by Weston Brass, PharmD.  He was notified by Hoy Register, PharmD Candidate.         Prior Anticoagulation Instructions: INR 2.1  Continue same dose of 1 tablet every day except 1 1/2  tablets on Monday.  Recheck INR in 4 weeks.   Current Anticoagulation Instructions: INR 1.5 Continue Lovenox shots daily until Monday, Take 2 tablets today then resume  previous dose of 1 tablet everyday except 1.5 tablets on Monday Recheck INR in 1 week

## 2010-03-02 NOTE — Letter (Signed)
Summary: [facility] Office Visit Note   [facility] Office Visit Note   Imported By: Roderic Ovens 12/07/2009 09:23:37  _____________________________________________________________________  External Attachment:    Type:   Image     Comment:   External Document

## 2010-03-02 NOTE — Medication Information (Signed)
Summary: rov/ez  Anticoagulant Therapy  Managed by: Cloyde Reams, RN, BSN Referring MD: Shawnie Pons MD Supervising MD: Johney Frame MD, Fayrene Fearing Indication 1: Aortic Valve Replacement (ICD-V43.3) Indication 2: Aortic Valve Disorder (ICD-424.1) Lab Used: [facility] Site: [facility] INR POC 2.5 INR RANGE 2 - 3  Dietary changes: no    Health status changes: no    Bleeding/hemorrhagic complications: no    Recent/future hospitalizations: no    Any changes in medication regimen? no    Recent/future dental: no  Any missed doses?: no       Is patient compliant with meds? yes       Allergies (verified): No Known Drug Allergies  Anticoagulation Management History:      The patient is taking warfarin and comes in today for a routine follow up visit.  Negative risk factors for bleeding include an age less than 61 years old.  The bleeding index is 'low risk'.  Negative CHADS2 values include Age > 30 years old.  The start date was 11/11/2003.  Anticoagulation responsible provider: Denney Shein MD, Fayrene Fearing.  INR POC: 2.5.  Cuvette Lot#: [id number].  Exp: 05/2010.    Anticoagulation Management Assessment/Plan:      The patient's current anticoagulation dose is Warfarin sodium 5 mg tabs: Use as directed by Anticoagulation Clinic.  The target INR is 2 - 3.  The next INR is due 04/14/2009.  Anticoagulation instructions were given to patient.  Results were reviewed/authorized by Cloyde Reams, RN, BSN.  He was notified by Cloyde Reams RN.         Prior Anticoagulation Instructions: INR: 3.0 Take 1/2 tablet today then resume to same dosage of 5mg  tablet daily except 7.5mg  on Mondays Recheck in 4 weeks  Current Anticoagulation Instructions: INR 2.5  Continue on same dosage 1 tablet daily except 1.5 tablets on Mondays.   Recheck in 4 weeks.

## 2010-03-02 NOTE — Medication Information (Signed)
Summary: rov/ewj  Anticoagulant Therapy  Managed by: Cloyde Reams, RN, BSN Referring MD: Shawnie Pons MD Supervising MD: Jens Som MD, Arlys John Indication 1: Aortic Valve Replacement (ICD-V43.3) Indication 2: Aortic Valve Disorder (ICD-424.1) Lab Used: [facility] Site: [facility] INR POC 2.7 INR RANGE 2 - 3  Dietary changes: yes       Details: Decr amt of broccoli intake.    Health status changes: no    Bleeding/hemorrhagic complications: no    Recent/future hospitalizations: no    Any changes in medication regimen? no    Recent/future dental: no  Any missed doses?: no       Is patient compliant with meds? yes       Allergies (verified): No Known Drug Allergies  Anticoagulation Management History:      The patient is taking warfarin and comes in today for a routine follow up visit.  Negative risk factors for bleeding include an age less than 73 years old.  The bleeding index is 'low risk'.  Negative CHADS2 values include Age > 80 years old.  The start date was 11/11/2003.  Anticoagulation responsible provider: Jens Som MD, Arlys John.  INR POC: 2.7.  Cuvette Lot#: [id number].  Exp: 05/2010.    Anticoagulation Management Assessment/Plan:      The patient's current anticoagulation dose is Warfarin sodium 5 mg tabs: Use as directed by Anticoagulation Clinic.  The target INR is 2 - 3.  The next INR is due 05/18/2009.  Anticoagulation instructions were given to patient.  Results were reviewed/authorized by Cloyde Reams, RN, BSN.  He was notified by Cloyde Reams RN.         Prior Anticoagulation Instructions: INR 2.5  Continue on same dosage 1 tablet daily except 1.5 tablets on Mondays.   Recheck in 4 weeks.    Current Anticoagulation Instructions: INR 2.7  Continue on same dosage 1 tablet daily except 1.5 tablets on Mondays.  Recheck in 4 weeks.

## 2010-03-02 NOTE — Assessment & Plan Note (Signed)
Summary: NEW PATIENT/LD   Vital Signs:  Patient profile:   65 year old male Height:      70 inches Weight:      235.13 pounds BMI:     33.86 Temp:     98.1 degrees F oral Pulse rate:   90 / minute Pulse rhythm:   regular Resp:     20 per minute BP sitting:   140 / 80  (right arm) Cuff size:   large  Vitals Entered By: Mervin Kung CMA (July 06, 2009 10:06 AM) CC: room 4  Pt is here to establish primary care. Has history of gout and feels he is getting over a recent flare up. Is Patient Diabetic? No   CC:  room 4  Pt is here to establish primary care. Has history of gout and feels he is getting over a recent flare up.Marland Kitchen  History of Present Illness: [name] is a 65 year old male who presents today to establish care.  He is well known the the [facility] system.  Previously followed by Dr. Willow Ora and currently being followed by Dr. Riley Kill [facility].    Prevenative- Patient does not exercise regularly.  Quit gym.  Walks playing golf.  Diet is healthy immunizations reviewed.  Eats low fat diet.    Gout- has pain in left heel started 11 days ago.  Has not taken any meds for this.  He has previously been on Allopurinol, however,  this was stopped for unclear reasons.  Notes that the pain in his foot is slowly starting to improve on its own.   Preventive Screening-Counseling & Management  Alcohol-Tobacco     Alcohol drinks/day: 0     Smoking Status: never  Caffeine-Diet-Exercise     Caffeine use/day: 50 oz daily     Does Patient Exercise: no  Allergies (verified): No Known Drug Allergies  Past History:  Family History: Last updated: 07/06/2009  Notable for father died in his 67s from myocardial  infarction.  Mother in her 41s also from myocardial infarction, had TIAs.  A  brother is 19 years old, also has known coronary artery disease.  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 Married with two children Tobacco Use - Yes.  He smokes about 6 cigars a month Alcohol Use - yes-  Occasionally,  glass wine 4x a month  Risk Factors: Alcohol Use: 0 (07-06-2009) Caffeine Use: 50 oz daily (07-06-09) Exercise: no (07-06-2009)  Risk Factors: Smoking Status: never (2009-07-06)  Past Medical History: COUMADIN THERAPY (ICD-V58.61) OBESITY, MODERATE (ICD-278.00) HYPERCHOLESTEROLEMIA (ICD-272.0) AORTIC STENOSIS (ICD-424.1) CAD, ARTERY BYPASS GRAFT (ICD-414.04) GOUT, HX OF (ICD-V12.2) Allergies heart murmur Hx of Hepatitis--1974 HTN  Past Surgical History: Aortic valve replacement [facility] [facility]. Coronary Artery bypass 10-2003 Rt. Hip surgery- 2006 Cholecystectomy-1990 Inguinal hernia (right) - 1999 Inguinal hernia (left) - 2002 pilonidal cyst- 1964 Tonsilectomy-child Appendectomy--1990s  Family History:  Notable for father died in his 74s from myocardial  infarction.  Mother in her 13s also from myocardial infarction, had TIAs.  A  brother is 46 years old, also has known coronary artery disease.  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 Married with two children Tobacco  Use - Yes.  He smokes about 6 cigars a month Alcohol Use - yes-  Occasionally,  glass wine 4x a month Smoking Status:  never Caffeine use/day:  50 oz daily Does Patient Exercise:  no  Review of Systems       Constitutional: Denies Fever ENT:  Denies nasal congestion or sore throat. Resp: Denies cough, notes occasional phlegm CV:  Denies Chest Pain or shortness of breath GI:  Denies nausea or vomitting GU: Denies dysuria Lymphatic: Denies lymphadenopathy Musculoskeletal:  L heel pain Skin:  Denies Rashes or concerning  lesions Psychiatric: Denies depression or anxiety Neuro: Denies numbness      Physical Exam  General:  Well-developed,well-nourished,in no acute distress; alert,appropriate and cooperative throughout examination Head:  Normocephalic and atraumatic without obvious abnormalities. No apparent alopecia or balding. Eyes:  PERRLA Ears:  External ear exam shows no significant lesions or deformities.  Otoscopic examination reveals clear canals, tympanic membranes are intact bilaterally without bulging, retraction, inflammation or discharge. Hearing is grossly normal bilaterally. Mouth:  Oral mucosa and oropharynx without lesions or exudates.  Teeth in good repair. Neck:  No deformities, masses, or tenderness noted. Lungs:  Normal respiratory effort, chest expands symmetrically. Lungs are clear to auscultation, no crackles or wheezes. Heart:  Normal rate and regular rhythm. S1 and S2 normal with note of mechanical valve click Abdomen:  Bowel sounds positive,abdomen soft and non-tender without masses, organomegaly or hernias noted. Rectal:  No external abnormalities noted. Normal sphincter tone. No rectal masses or tenderness. Stool is heme negative Genitalia:  Testes bilaterally descended without nodularity, tenderness or masses. No scrotal masses or lesions. No penis lesions or urethral discharge. Prostate:  Prostate gland firm and smooth, no enlargement, nodularity, tenderness, mass, asymmetry or induration. Msk:  No deformity or scoliosis noted of thoracic or lumbar spine.   Pulses:  2+ bilateral DP pulses bilaterally Extremities:  1-2+ RLE edema, 2+ edema LLE (side affected by gout) Neurologic:  No cranial nerve deficits noted. Station and gait are normal. Plantar reflexes are down-going bilaterally. DTRs are symmetrical throughout. Sensory, motor and coordinative functions appear intact with exception of patient's being hard of hearing. Patient is able to understand loud conversation. Skin:   Intact without suspicious lesions or rashes Cervical Nodes:  No lymphadenopathy noted Psych:  Cognition and judgment appear intact. Alert and cooperative with normal attention span and concentration. No apparent delusions, illusions, hallucinations   Impression & Recommendations:  Problem # 1:  Preventive Health Care (ICD-V70.0) Assessment Comment Only Patient counseled on diet and exercise.  Also counseled on need to quit  smoking cigars.  Immunizations reviewed and up to date. Patient had EKG performed last month at cardiology, no cardiac complaints today.   Orders: T-Comprehensive Metabolic Panel [id number]) T-TSH [id number]) T-PSA [id number]) Misc. Referral (Misc. Ref)  Problem # 2:  GOUT, HX OF (ICD-V12.2) Assessment: Deteriorated now with resolving acute exacerbation-Pt given rx for as needed colchicine.  Will check uric acid level.   Orders: T-Uric Acid (Blood) ([id number])  Problem # 3:  HYPERTENSION (ICD-401.9) Assessment: Comment Only BP is up a bit today, took meds,  will monitor for now as BP looked good last visit.  If it remains high, will consider increasing dose of Benicar Hct. His updated medication list for this problem includes:    Toprol Xl 25 Mg Xr24h-tab (Metoprolol succinate) .Marland Kitchen... Take 1 tablet by mouth once a day    Benicar Hct 20-12.5 Mg Tabs (Olmesartan medoxomil-hctz) .Marland Kitchen... Take 1 tablet by mouth once a  day  BP today: 140/80 Prior BP: 116/70 (05/18/2009)  Labs Reviewed: K+: 4.1 (05/21/2007) Creat: : 1.2 (05/21/2007)   Chol: 126 (05/18/2009)   HDL: 38.70 (05/18/2009)   LDL: 66 (05/21/2007)   TG: 288.0 (05/18/2009)  Problem # 4:  CORONARY ATHEROSCLEROSIS NATIVE CORONARY ARTERY (ICD-414.01) Assessment: Comment Only Denies cardiac complaints, on B blocker, s/p CABG.  Followed by cardiology- defer management to cardiology. Continue coumadin for mechanical AV- following at [facility].  His updated medication list for this problem  includes:    Toprol Xl 25 Mg Xr24h-tab (Metoprolol succinate) .Marland Kitchen... Take 1 tablet by mouth once a day    Benicar Hct 20-12.5 Mg Tabs (Olmesartan medoxomil-hctz) .Marland Kitchen... Take 1 tablet by mouth once a day  Problem # 5:  HEARING LOSS, SENSORINEURAL, BILATERAL (ICD-389.10) Assessment: Unchanged Unchanged, patient is able to understand loud conversation.  Has been evaluated by Dr[facility] Lazarus Salines at [facility].  Has had no improvement with use of hearing aids.  Complete Medication List: 1)  Toprol Xl 25 Mg Xr24h-tab (Metoprolol succinate) .... Take 1 tablet by mouth once a day 2)  Benicar Hct 20-12.5 Mg Tabs (Olmesartan medoxomil-hctz) .... Take 1 tablet by mouth once a day 3)  Niaspan 500 Mg Cr-tabs (Niacin (antihyperlipidemic)) .... Take 1 tablet by mouth once a day at bedtime 4)  Crestor 10 Mg Tabs (Rosuvastatin calcium) .... Take one tablet by mouth daily. 5)  Jantoven 5 Mg Tabs (Warfarin sodium) .... Take as directecd 6)  Viagra 50 Mg Tabs (Sildenafil citrate) .... Take one tablet as needed 7)  Colcrys 0.6 Mg Tabs (Colchicine) .... 2 tabs by mouth x 1 at start of gout flare, may take one tablet 1 hour later  Other Orders: Tobacco use cessation intermediate 3-10 minutes ([id number])  Patient Instructions: 1)  Work hard on diet, exercise and weight loss. 2)  You will be contacted about your referral to GI for colonoscopy. 3)  Stop smoking cigars- they are bad for your health! 4)  It was a pleasure to meet you. 5)  Please follow up in 3 months, sooner if problems or concerns. Prescriptions: COLCRYS 0.6 MG TABS (COLCHICINE) 2 tabs by mouth x 1 at start of gout flare, may take one tablet 1 hour later  #15 x 0   Entered and Authorized by:   [name] FNP   Signed by:   [name] FNP on 06/16/2009   Method used:   Electronically to        [facility]* (retail)       [address]       [place], [place]  [address]       Ph: [phone]       Fax:  [phone]   RxID:   [id number]   Current Allergies (reviewed today): No known allergies    Preventive Care Screening  PPD:    Date:  03/30/2009    Results:  negative   Last Flu Shot:    Date:  11/30/2008    Results:  historical   Last Tetanus Booster:    Date:  06/01/2000    Results:  Historical   Colonoscopy:    Date:  01/31/1993    Results:  normal

## 2010-03-02 NOTE — Medication Information (Signed)
Summary: ccr/ gd  Anticoagulant Therapy  Managed by: Cloyde Reams, RN, BSN Referring MD: Shawnie Pons MD Supervising MD: Johney Frame MD, Fayrene Fearing Indication 1: Aortic Valve Replacement (ICD-V43.3) Indication 2: Aortic Valve Disorder (ICD-424.1) Lab Used: [facility] Site: [facility] INR POC 2.5 INR RANGE 2 - 3  Dietary changes: no    Health status changes: no    Bleeding/hemorrhagic complications: no    Recent/future hospitalizations: no    Any changes in medication regimen? no    Recent/future dental: no  Any missed doses?: no       Is patient compliant with meds? yes       Allergies: No Known Drug Allergies  Anticoagulation Management History:      The patient is taking warfarin and comes in today for a routine follow up visit.  Positive risk factors for bleeding include presence of serious comorbidities.  Negative risk factors for bleeding include an age less than 14 years old.  The bleeding index is 'intermediate risk'.  Positive CHADS2 values include History of HTN and History of Diabetes.  Negative CHADS2 values include Age > 57 years old.  The start date was 11/11/2003.  Anticoagulation responsible provider: Allred MD, Fayrene Fearing.  INR POC: 2.5.  Cuvette Lot#: [id number].  Exp: 12/2010.    Anticoagulation Management Assessment/Plan:      The patient's current anticoagulation dose is Jantoven 5 mg tabs: Take as directecd.  The target INR is 2 - 3.  The next INR is due 11/30/2009.  Anticoagulation instructions were given to patient.  Results were reviewed/authorized by Cloyde Reams, RN, BSN.  He was notified by Cloyde Reams RN.         Prior Anticoagulation Instructions: INR 1.9  Take one and one-half tablets today, then resume regular dose of one tablet every day except one and one-half tablets on Monday. Return to clinic on 11/01/09.    Current Anticoagulation Instructions: INR 2.5  Continue on same dosage 1 tablet daily except 1.5 tablets on Mondays.  Recheck in 4  weeks.

## 2010-03-02 NOTE — Letter (Signed)
Summary: [facility]  [facility]   Imported By: Debby Freiberg 07/28/2009 14:45:56  _____________________________________________________________________  External Attachment:    Type:   Image     Comment:   External Document

## 2010-03-02 NOTE — Assessment & Plan Note (Signed)
Summary: roa/mhf--rm 5   Vital Signs:  Patient profile:   65 year old male Height:      70 inches Weight:      231.75 pounds BMI:     33.37 Temp:     98.3 degrees F oral Pulse rate:   84 / minute Pulse rhythm:   regular Resp:     16 per minute BP sitting:   112 / 82  (right arm) Cuff size:   large  Vitals Entered By: [name] CMA (July 01, 2009 11:14 AM) CC: room 5  2 week follow up. Pt states that foot/gout is better. Is Patient Diabetic? No   CC:  room 5  2 week follow up. Pt states that foot/gout is better.Marland Kitchen  History of Present Illness: [name] is a 65 year old male who presents today for follow up.    1) Gout- patient took colcrys and noted that his symptoms resolved shortly after taking.  2) Hyperglycemia- Last visit he completed non-fasting lab work which noted glucose of 198.  He denies polyuria or nocturia, but does report that both of his parents were diabetic.  He has never been told that he his sugar has been high.  3) Elevated PSA-  Patient was found to have PSA of 13 on routine screening.    4)HTN- he continues benicar HCT and Toprol XL.  Allergies (verified): No Known Drug Allergies  Physical Exam  General:  Well-developed,well-nourished,in no acute distress; alert,appropriate and cooperative throughout examination Head:  Normocephalic and atraumatic without obvious abnormalities. No apparent alopecia or balding. Lungs:  Normal respiratory effort, chest expands symmetrically. Lungs are clear to auscultation, no crackles or wheezes. Heart:  Normal rate and regular rhythm. S1 and S2 normal without gallop, murmur, click, rub or other extra sounds.   Impression & Recommendations:  Problem # 1:  HYPERGLYCEMIA (ICD-790.29) Assessment New Will check A1C- suspect this will be elevated.  Patient was educated on diabetic diet and exercise.    Problem # 2:  PSA, INCREASED (ICD-790.93) Assessment: New Patient has upcoming appointment with  Urology.  Problem # 3:  HYPERTENSION (ICD-401.9) Assessment: Improved BP is better today.  Continue current meds. If recurrent gout flare, consider discontinuation of HCTZ. His updated medication list for this problem includes:    Toprol Xl 25 Mg Xr24h-tab (Metoprolol succinate) .Marland Kitchen... Take 1 tablet by mouth once a day    Benicar Hct 20-12.5 Mg Tabs (Olmesartan medoxomil-hctz) .Marland Kitchen... Take 1 tablet by mouth once a day  BP today: 112/82 Prior BP: 140/80 (06/16/2009)  Labs Reviewed: K+: 4.0 (06/16/2009) Creat: : 1.19 (06/16/2009)   Chol: 126 (05/18/2009)   HDL: 38.70 (05/18/2009)   LDL: 66 (05/21/2007)   TG: 288.0 (05/18/2009)  Problem # 4:  GOUT, HX OF (ICD-V12.2) Assessment: Improved S/p colchicine, resolved.   Complete Medication List: 1)  Toprol Xl 25 Mg Xr24h-tab (Metoprolol succinate) .... Take 1 tablet by mouth once a day 2)  Benicar Hct 20-12.5 Mg Tabs (Olmesartan medoxomil-hctz) .... Take 1 tablet by mouth once a day 3)  Niaspan 500 Mg Cr-tabs (Niacin (antihyperlipidemic)) .... Take 1 tablet by mouth once a day at bedtime 4)  Crestor 10 Mg Tabs (Rosuvastatin calcium) .... Take one tablet by mouth daily. 5)  Jantoven 5 Mg Tabs (Warfarin sodium) .... Take as directecd 6)  Viagra 50 Mg Tabs (Sildenafil citrate) .... Take one tablet as needed 7)  Colcrys 0.6 Mg Tabs (Colchicine) .... 2 tabs by mouth x 1 at start of  gout flare, may take one tablet 1 hour later as needed  Other Orders: T-Hgb A1C ([id number])  Patient Instructions: 1)  Complete your blood work prior to leaving today. 2)  We will contact you with the results of your lab test. 3)  Please follow up in 1 month.  Current Allergies (reviewed today): No known allergies

## 2010-03-02 NOTE — Medication Information (Signed)
Summary: rov/ewj  Anticoagulant Therapy  Managed by: Weston Brass, PharmD Referring MD: Shawnie Pons MD PCP: [name] FNP Supervising MD: Shirlee Latch MD, Freida Busman Indication 1: Aortic Valve Replacement (ICD-V43.3) Indication 2: Aortic Valve Disorder (ICD-424.1) Lab Used: [facility] Site: [facility] INR POC 2.1 INR RANGE 2 - 3  Dietary changes: no    Health status changes: yes       Details: planning on medical treatment for prostate cancer rather than surgery at this time  Bleeding/hemorrhagic complications: no    Recent/future hospitalizations: no    Any changes in medication regimen? no    Recent/future dental: no  Any missed doses?: no       Is patient compliant with meds? yes       Allergies: No Known Drug Allergies  Anticoagulation Management History:      The patient is taking warfarin and comes in today for a routine follow up visit.  Positive risk factors for bleeding include presence of serious comorbidities.  Negative risk factors for bleeding include an age less than 5 years old.  The bleeding index is 'intermediate risk'.  Positive CHADS2 values include History of HTN and History of Diabetes.  Negative CHADS2 values include Age > 47 years old.  The start date was 11/11/2003.  Anticoagulation responsible [place]aui Britten: Shirlee Latch MD, [place].  INR POC: 2.1.  Cuvette Lot#: [id number].  Exp: 12/2010.    Anticoagulation Management Assessment/Plan:      The patient's current anticoagulation dose is Jantoven 5 mg tabs: Take as directecd.  The target INR is 2 - 3.  The next INR is due 12/27/2009.  Anticoagulation instructions were given to patient.  Results were reviewed/authorized by Weston Brass, PharmD.  He was notified by Weston Brass PharmD.         Prior Anticoagulation Instructions: INR 2.5  Continue on same dosage 1 tablet daily except 1.5 tablets on Mondays.  Recheck in 4 weeks.    Current Anticoagulation Instructions: INR 2.1  Continue same dose of 1 tablet  every day except 1 1/2 tablets on Monday.  Recheck INR in 4 weeks.

## 2010-03-02 NOTE — Medication Information (Signed)
Summary: rov/sp  Anticoagulant Therapy  Managed by: Weston Brass, PharmD Referring MD: Shawnie Pons MD Supervising MD: Excell Seltzer MD, Casimiro Needle Indication 1: Aortic Valve Replacement (ICD-V43.3) Indication 2: Aortic Valve Disorder (ICD-424.1) Lab Used: [facility] Site: [facility] INR POC 2.4 INR RANGE 2 - 3  Dietary changes: no    Health status changes: yes       Details: pending colonoscopy on 6/28.  Seeing GI today for further instructions.  Will send to Dr. Riley Kill for okay to hold Coumadin  Bleeding/hemorrhagic complications: no    Recent/future hospitalizations: no    Any changes in medication regimen? no    Recent/future dental: no  Any missed doses?: no       Is patient compliant with meds? yes      Comments: Spoke with pt.  Having consultation for colonoscopy on 6/28.  Also had note from [facility].  Pt needs a prostate biopsy.  Will try to schedule both at the same time to prevent having to stop Coumadin 2 times.  Spoke with Dr. Riley Kill yesterday.  Will bridge patient with Lovenox pre-op but will not post-op due to potential bleeding associated with prostate.   Allergies: No Known Drug Allergies  Anticoagulation Management History:      The patient is taking warfarin and comes in today for a routine follow up visit.  Positive risk factors for bleeding include presence of serious comorbidities.  Negative risk factors for bleeding include an age less than 25 years old.  The bleeding index is 'intermediate risk'.  Positive CHADS2 values include History of HTN and History of Diabetes.  Negative CHADS2 values include Age > 32 years old.  The start date was 11/11/2003.  Anticoagulation responsible provider: Excell Seltzer MD, Casimiro Needle.  INR POC: 2.4.  Cuvette Lot#: [id number].  Exp: 08/2010.    Anticoagulation Management Assessment/Plan:      The patient's current anticoagulation dose is Jantoven 5 mg tabs: Take as directecd.  The target INR is 2 - 3.  The next INR is due  08/10/2009.  Anticoagulation instructions were given to patient.  Results were reviewed/authorized by Weston Brass, PharmD.  He was notified by Weston Brass PharmD.         Prior Anticoagulation Instructions: INR 2.2  Continue taking 1.5 tablets on Monday and 1 tablet all other days.  Return to clinic in 4 weeks.    Current Anticoagulation Instructions: INR 2.4  Continue same dose of Coumadin.  Will f/u with colonoscopy instructions. Recheck INR in 4 weeks.  Pt instructed to call once colonoscopy and biopsy schedule.d

## 2010-03-02 NOTE — Letter (Signed)
Summary: New Patient letter  [facility]  [address] [place], [place] [address]   Phone: [phone]  Fax: [phone]       06/17/2009 MRN: [id number]  [name] [address] [place], [place]  [address]  Dear [name],  Welcome to the Gastroenterology Division at [facility].    You are scheduled to see Dr.  Rob Bunting on July 27, 2009 at 9:00am on the 3rd floor at [facility], [address]  We ask that you try to arrive at our office 15 minutes prior to your appointment time to allow for check-in.  We would like you to complete the enclosed self-administered evaluation form prior to your visit and bring it with you on the day of your appointment.  We will review it with you.  Also, please bring a complete list of all your medications or, if you prefer, bring the medication bottles and we will list them.  Please bring your insurance card so that we may make a copy of it.  If your insurance requires a referral to see a specialist, please bring your referral form from your primary care physician.  Co-payments are due at the time of your visit and may be paid by cash, check or credit card.     Your office visit will consist of a consult with your physician (includes a physical exam), any laboratory testing he/she may order, scheduling of any necessary diagnostic testing (e.g. x-ray, ultrasound, CT-scan), and scheduling of a procedure (e.g. Endoscopy, Colonoscopy) if required.  Please allow enough time on your schedule to allow for any/all of these possibilities.    If you cannot keep your appointment, please call [phone] to cancel or reschedule prior to your appointment date.  This allows [name] the opportunity to schedule an appointment for another patient in need of care.  If you do not cancel or reschedule by 5 p.m. the business day prior to your appointment date, you will be charged a $50.00 late cancellation/no-show fee.    Thank you for  choosing [facility] for your medical needs.  We appreciate the opportunity to care for you.  Please visit [name] at our website  to learn more about our practice.                     Sincerely,                                                             The Gastroenterology Division

## 2010-03-02 NOTE — Assessment & Plan Note (Signed)
Summary: f1y  Medications Added JANTOVEN 5 MG TABS (WARFARIN SODIUM) Take as directecd VIAGRA 50 MG TABS (SILDENAFIL CITRATE) take one tablet as needed      Allergies Added: NKDA  Visit Type:  1 year follow up  CC:  No cardiac complains.  History of Present Illness: [name] is doing well.  No chest pain.  Feels good.  Walks nine holes regularly without difficulty.  Has a rule that if there is a 9 before the temperature, he does not play.  I reinforced that rule.  He has no issues.  Would like to try Viagra again at the present time.  No SOB with exertion.  Class I.  Current Medications (verified): 1)  Toprol Xl 25 Mg Xr24h-Tab (Metoprolol Succinate) .... Take 1 Tablet By Mouth Once A Day 2)  Benicar Hct 20-12.5 Mg Tabs (Olmesartan Medoxomil-Hctz) .... Take 1 Tablet By Mouth Once A Day 3)  Niaspan 500 Mg Cr-Tabs (Niacin (Antihyperlipidemic)) .... Take 1 Tablet By Mouth Once A Day At Bedtime 4)  Crestor 10 Mg Tabs (Rosuvastatin Calcium) .... Take One Tablet By Mouth Daily. 5)  Jantoven 5 Mg Tabs (Warfarin Sodium) .... Take As Directecd  Allergies (verified): No Known Drug Allergies  Past History:  Past Medical History: Last updated: 27-May-2008 COUMADIN THERAPY (ICD-V58.61) OBESITY, MODERATE (ICD-278.00) HYPERCHOLESTEROLEMIA (ICD-272.0) AORTIC STENOSIS (ICD-424.1) CAD, ARTERY BYPASS GRAFT (ICD-414.04) GOUT, HX OF (ICD-V12.2)  Past Surgical History: Last updated: 05/27/2008 Aortic valve replacement [facility]. Coronary Artery bypass 10-2003 Rt. Hip surgery- 2006 Cholecystectomy-1990 Inguinal hernia (right) - 1999 Inguinal hernia (left) - 2002 pilonidal cyst- 1964 Tonsilectomy-child  Family History: Last updated: 27-May-2008  Notable for father died in his 2s from myocardial  infarction.  Mother in her 29s also from myocardial infarction, had TIAs.  A  brother is 60 years old, also has known coronary artery disease.  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 Married with two children Tobacco Use - Yes.  He smokes about 6 cigars a month Alcohol Use - yes-  Occasionally  Vital Signs:  Patient profile:   65 year old male Height:      70 inches Weight:      232.50 pounds BMI:     33.48 Pulse rate:   73 / minute Pulse rhythm:   regular Resp:     18 per minute BP sitting:   116 / 70  (left arm) Cuff size:   large  Vitals Entered By: Vikki Ports (May 18, 2009 9:04 AM) CC: No cardiac complains Comments INR today = 2/9   Physical Exam  General:  Well developed, well nourished, in no acute distress. Head:  normocephalic and atraumatic Eyes:  PERRLA/EOM intact; conjunctiva and lids normal. Neck:  No caroitd bruits.  No masses.   Chest Wall:  no deformities or breast masses noted Lungs:  Clear bilaterally to auscultation and percussion. Heart:  PMI non displaced.  Normal S1.  Crisp VS.  No murmurs.  No rub.  PMI not displaced. Pulses:  pulses normal in all 4 extremities Extremities:  No clubbing or cyanosis. Neurologic:  Alert and oriented x 3. Skin:  Has sunburn.  Discussed need to cover himself.  No definite lesions seen in exposed areas.   Cervical Nodes:  no significant adenopathy   EKG  Procedure date:  05/18/2009  Findings:      NSR.  Prob WNL.  Impression & Recommendations:  Problem # 1:  AORTIC STENOSIS (ICD-424.1) Prior AVR.  Crisp VS.  No obvious problems with warfarin.  Monitor CBC.  No history of bleeding to date.  Problem # 2:  HYPERCHOLESTEROLEMIA (ICD-272.0) Prior CABG. Need to check lipid and liver. His updated medication list for this problem includes:    Niaspan 500 Mg Cr-tabs (Niacin (antihyperlipidemic)) .Marland Kitchen... Take 1 tablet by mouth once a day at bedtime    Crestor 10 Mg Tabs (Rosuvastatin calcium) .Marland Kitchen... Take one tablet by mouth daily.  Orders: TLB-CBC Platelet - w/Differential (85025-CBCD) TLB-Lipid Panel (80061-LIPID) TLB-Hepatic/Liver  Function Pnl (80076-HEPATIC)  Problem # 3:  CAD, ARTERY BYPASS GRAFT (ICD-414.04) No symptoms at present.  Continue medical therapy.  Plays nine holes without symptoms. His updated medication list for this problem includes:    Toprol Xl 25 Mg Xr24h-tab (Metoprolol succinate) .Marland Kitchen... Take 1 tablet by mouth once a day    Jantoven 5 Mg Tabs (Warfarin sodium) .Marland Kitchen... Take as directecd  Orders: EKG w/ Interpretation (93000) TLB-CBC Platelet - w/Differential (85025-CBCD) TLB-Lipid Panel (80061-LIPID) TLB-Hepatic/Liver Function Pnl (80076-HEPATIC)  Problem # 4:  HYPERTROPHIC OBSTRUCTIVE CARDIOMYOPATHY (ICD-425.1) No murmur at present.  Not sure of diagnosis.  Should likely repeat echocardiogram to assess LV wall thickness. His updated medication list for this problem includes:    Toprol Xl 25 Mg Xr24h-tab (Metoprolol succinate) .Marland Kitchen... Take 1 tablet by mouth once a day    Benicar Hct 20-12.5 Mg Tabs (Olmesartan medoxomil-hctz) .Marland Kitchen... Take 1 tablet by mouth once a day    Jantoven 5 Mg Tabs (Warfarin sodium) .Marland Kitchen... Take as directecd  Problem # 5:  ERECTILE DYSFUNCTION, ORGANIC (ICD-607.84) Suspect that this is organic.   Long history of CAD.  He has used viagra in past.    Patient Instructions: 1)  Your physician recommends that you schedule a follow-up appointment in: 12 months Prescriptions: VIAGRA 50 MG TABS (SILDENAFIL CITRATE) take one tablet as needed  #6 x 0   Entered by:   Ollen Gross, RN, BSN   Authorized by:   Ronaldo Miyamoto, MD, [facility]   Signed by:   Ollen Gross, RN, BSN on 05/18/2009   Method used:   Electronically to        [facility]* (retail)       [address]       [place], [place]  [address]       Ph: [phone]       Fax: [phone]   RxID:   [id number]

## 2010-03-02 NOTE — Progress Notes (Signed)
Summary: Phone-switch doctors  Phone Note Call from Patient   Caller: Patient Summary of Call: Patient would like to switch from Dr, Drue Novel to Dr. Alwyn Ren is this ok. Initial call taken by: Barb Merino,  March 09, 2009 4:39 PM  Follow-up for Phone Call        ok with me [facility] E. Paz MD  March 10, 2009 8:14 AM   Additional Follow-up for Phone Call Additional follow up Details #1::        Nikki , I don't accept patients from  within the practice for 2 reasons : #1) potential impact on my relationship with my peer , whom I trust & #2) I'm having difficulty meeting needs of my present patients because I've  practiced  > 35 years. I'm always happy to see one of the othe MDs patients when their out of office. Hopp Additional Follow-up by: Marga Melnick MD,  March 10, 2009 3:22 PM    Additional Follow-up for Phone Call Additional follow up Details #2::    PATIENT IS REQUESTING TO SEE DR HOPPER IS THIS OK DR HOPPER   called patient and informed patient he stated he will look for another Dr. in the [place]...Marland KitchenMarland KitchenBarb Merino  March 11, 2009 1:12 PM  Follow-up by: Barb Merino,  March 10, 2009 11:01 AM  Additional Follow-up for Phone Call Additional follow up Details #3:: Details for Additional Follow-up Action Taken: see Hopps note, he could see another [facility]  PCP  in another location.  Jose E. Paz MD  March 10, 2009 5:15 PM

## 2010-03-02 NOTE — Miscellaneous (Signed)
  Clinical Lists Changes  Problems: Changed problem from PSA, INCREASED (ICD-790.93) to ADENOCARCINOMA, PROSTATE (ICD-185)

## 2010-03-02 NOTE — Progress Notes (Signed)
Summary: needs to go off coumadin for 5 days  Phone Note Call from Patient   Caller: Patient [phone] Reason for Call: Talk to Nurse Summary of Call: pt having gold markers placed in his prostate on nov 15th, needs to stop plavix 5 days prior, needs rx for lovanox at [facility], also wants to know when to go back on plavix and when to stop the lovanox-pls advise Initial call taken by: Glynda Jaeger,  December 02, 2009 12:38 PM  Follow-up for Phone Call        This pt does not take Plavix.  He is calling about stopping his Warfarin.  This was stopped in the past for prostate biopsy and the pt was bridged with Lovenox.  Julieta Gutting, RN, BSN  December 02, 2009 3:36 PM  Additional Follow-up for Phone Call Additional follow up Details #1::        Pt has been bridged with Lovenox in the past.  Please clarify if pt should be bridged before and after procedure or just after.   Weston Brass PharmD  December 02, 2009 5:07 PM  Patient should receive Lovenox for day or two before surgery to minimize time off of warfarin.  I assume he will start back day after.  TS Additional Follow-up by: Ronaldo Miyamoto, MD, [facility],  December 04, 2009 9:32 AM     Appended Document: needs to go off coumadin for 5 days**TS** Spoke with pt.  Gave following instructions for Lovenox bridge:  Weight- 103kg, SCr- 1.19  11/10- Take last dose of Coumadin 11/11- No Coumadin or Lovenox 11/12- 11/14- Lovenox 150mg  injection once daily 11/15- Procedure  Will f/u after procedure with instructions for restarting Coumadin and Lovenox.     Clinical Lists Changes  Medications: Added new medication of ENOXAPARIN SODIUM 150 MG/ML SOLN (ENOXAPARIN SODIUM) Inject 1 syringe subcutaneously once daily as directed - Signed Rx of ENOXAPARIN SODIUM 150 MG/ML SOLN (ENOXAPARIN SODIUM) Inject 1 syringe subcutaneously once daily as directed;  #3 x 0;  Signed;  Entered by: Weston Brass PharmD;  Authorized by: Ronaldo Miyamoto, MD, [facility];  Method used: Electronically to [facility]*, [address], [place], [place], [place]  [address], Ph: [phone], Fax: [phone]    Prescriptions: ENOXAPARIN SODIUM 150 MG/ML SOLN (ENOXAPARIN SODIUM) Inject 1 syringe subcutaneously once daily as directed  #3 x 0   Entered by:   Weston Brass PharmD   Authorized by:   Ronaldo Miyamoto, MD, [facility]   Signed by:   Weston Brass PharmD on 12/07/2009   Method used:   Electronically to        [facility]* (retail)       [address]       [place], [place]  [address]       Ph: [phone]       Fax: [phone]   RxID:   [id number]

## 2010-03-02 NOTE — Progress Notes (Signed)
Summary: Pt reporting bp readings  Phone Note Call from Patient Call back at [phone]   Caller: Patient Summary of Call: Pt called to report: 10/14/09 140/76, 10/15/09 146/80, 10/17/09 148/82, 10/18/09 151/84, same heart rate of 75 Initial call taken by: Lannette Donath,  October 18, 2009 9:46 AM  Follow-up for Phone Call        Called patient's home, per wife he will be home later this afternoon.  When patient calls back I would like to ask him to resume 1/2 tablet of benicar HCT once daily, continue to monitor his blood pressures and call us in 1 week with the readings. Follow-up by: [name] FNP,  October 18, 2009 1:23 PM  Additional Follow-up for Phone Call Additional follow up Details #1::        left message for patient to return our call.  Additional Follow-up by: [name] FNP,  October 18, 2009 4:23 PM    Additional Follow-up for Phone Call Additional follow up Details #2::    Pt notified. Nicki Guadalajara Fergerson CMA (AAMA)  October 19, 2009 8:21 AM   New/Updated Medications: BENICAR HCT 20-12.5 MG TABS (OLMESARTAN MEDOXOMIL-HCTZ) Take 1/2  tablet by mouth once a day

## 2010-03-02 NOTE — Assessment & Plan Note (Signed)
Summary: 2 month follow up/mhf-- Rm 5   Vital Signs:  Patient profile:   65 year old male Height:      70 inches Weight:      225.50 pounds BMI:     32.47 Temp:     97.7 degrees F oral Pulse rate:   90 / minute Pulse rhythm:   regular Resp:     16 per minute BP sitting:   98 / 70  (right arm) Cuff size:   large  Vitals Entered By: Mervin Kung CMA [name] (October 13, 2009 9:51 AM) CC: Rm 5  2 month f/u.  Is Patient Diabetic? Yes Comments Pt has completed Lovenox. Nicki Guadalajara Fergerson CMA (AAMA)  October 13, 2009 9:56 AM    CC:  Rm 5  2 month f/u. Marland Kitchen  History of Present Illness: [name] is a 65 year old male who presents today for follow up.    1) Prostate cancer-  Patient recently underwent prostate biopsy which unfortunately was positive for prostate cancer.  He has an upcoming bone scan scheduled by Urology after which he is to follow up with them to determine plan of treatment.   2) DM- has been taking pills.  Not recently checking sugar.    3) Rectal bleeding-  patient recently also had a colonoscopy performed by Dr. Christella Hartigan.  This was negative except for some old blood from prostate biopsy.  Following this procedure he experienced  significant rectal bleeding.  His Hgb yesterday was 12.9 (baseline about 16).  Todal he took his blood pressure medications, but is noted to be hypotensive.  He is asymptomatic from his Hypotension.    Allergies (verified): No Known Drug Allergies  Past History:  Past Medical History: Last updated: 07/27/2009 oBESITY, MODERATE (ICD-278.00) HYPERCHOLESTEROLEMIA (ICD-272.0) AORTIC STENOSIS (ICD-424.1) CAD, ARTERY BYPASS GRAFT (ICD-414.04) GOUT, HX OF (ICD-V12.2) Allergies heart murmur Hx of Hepatitis--1974 HTN  Past Surgical History: Last updated: 07/27/2009 Aortic valve replacement [facility]. Coronary Artery bypass 10-2003 Rt. Hip surgery- 2006 Cholecystectomy-1990 Inguinal hernia (right) - 1999 Inguinal hernia (left)  - 2002 pilonidal cyst- 1964 Tonsilectomy-child Appendectomy--1990s   Physical Exam  General:  Well-developed,well-nourished,in no acute distress; alert,appropriate and cooperative throughout examination Head:  Normocephalic and atraumatic without obvious abnormalities. No apparent alopecia or balding. Lungs:  Normal respiratory effort, chest expands symmetrically. Lungs are clear to auscultation, no crackles or wheezes. Heart:  Normal rate and regular rhythm. S1 and S2 normal without gallop, murmur, click, rub or other extra sounds. Extremities:  1+ left pedal edema and 1+ right pedal edema.  1+ left pedal edema and 1+ right pedal edema.    Diabetes Management Exam:    Foot Exam (with socks and/or shoes not present):       Sensory-Monofilament:          Left foot: normal          Right foot: normal       Inspection:          Left foot: normal          Right foot: normal       Nails:          Left foot: normal          Right foot: normal   Impression & Recommendations:  Problem # 1:  DM (ICD-250.00) Assessment Comment Only Will check A1C, on ace, not on aspirin due to coumadin therapy, foot exam WNL today.  Flu shot was given today.  Benicar Hct 20-12.5 Mg Tabs (Olmesartan medoxomil-hctz) .Marland Kitchen... Take 1 tablet by mouth once a day    Metformin Hcl 500 Mg Tabs (Metformin hcl) ..... One tablet by mouth daily x 1 week, then increase to one tablet by mouth twice daily  Labs Reviewed: Creat: 1.19 (06/16/2009)    Reviewed HgBA1c results: 7.2 (07/01/2009)  Orders: T-Hgb A1C ([address]) Urine Microalbumin ([address])  Problem # 2:  ADENOCARCINOMA, PROSTATE (ICD-185) Assessment: New Defer treatment to urology.  Support provided to patient.   Problem # 3:  HYPERTENSION (ICD-401.9) Assessment: Deteriorated BP is low today, perhaps due to recent acute blood loss.  No further bleeding reported since his CBC which was drawn yesterday.  Will plan to continue the Beta blocker due to  history of CAD.  Will hold Benicare HCT- pt has BP monitor at home, instructions for monitoring and call back were provided as noted in pt sign out sheet.  His updated medication list for this problem includes:    Toprol Xl 25 Mg Xr24h-tab (Metoprolol succinate) .Marland Kitchen... Take 1 tablet by mouth once a day    Benicar Hct 20-12.5 Mg Tabs (Olmesartan medoxomil-hctz) .Marland Kitchen... Take 1 tablet by mouth once a day  Complete Medication List: 1)  Toprol Xl 25 Mg Xr24h-tab (Metoprolol succinate) .... Take 1 tablet by mouth once a day 2)  Benicar Hct 20-12.5 Mg Tabs (Olmesartan medoxomil-hctz) .... Take 1 tablet by mouth once a day 3)  Niaspan 500 Mg Cr-tabs (Niacin (antihyperlipidemic)) .... Take 1 tablet by mouth once a day at bedtime 4)  Crestor 10 Mg Tabs (Rosuvastatin calcium) .... Take one tablet by mouth daily. 5)  Jantoven 5 Mg Tabs (Warfarin sodium) .... Take as directecd 6)  Colcrys 0.6 Mg Tabs (Colchicine) .... 2 tabs by mouth x 1 at start of gout flare, may take one tablet 1 hour later as needed 7)  Metformin Hcl 500 Mg Tabs (Metformin hcl) .... One tablet by mouth daily x 1 week, then increase to one tablet by mouth twice daily 8)  Freestyle Lite Test Strp (Glucose blood) .... Test blood sugar once a day. 9)  Freestyle Lancets Misc (Lancets) .... Check blood sugar once a day.  Other Orders: Admin 1st Vaccine (91478) Flu Vaccine 3yrs + [phone])  Patient Instructions: 1)  Continue Toprol, hold Benicar HCT. 2)  Check BP daily. 3)  Call if BP greater than 140/90 4)  Call if BP less than 100/80. 5)  Complete your blood work downstairs. 6)  Follow up in 3 weeks so that we can check on your blood pressure.   Current Allergies (reviewed today): No known allergies  Flu Vaccine Consent Questions     Do you have a history of severe allergic reactions to this vaccine? no    Any prior history of allergic reactions to egg and/or gelatin? no    Do you have a sensitivity to the preservative Thimersol? no     Do you have a past history of Guillan-Barre Syndrome? no    Do you currently have an acute febrile illness? no    Have you ever had a severe reaction to latex? no    Vaccine information given and explained to patient? yes    Are you currently pregnant? no    Lot Number:[id number]   Exp Date:07/30/2010   Site Given  right Deltoid IM  Mervin Kung CMA [name]  October 13, 2009 10:17 AM

## 2010-03-02 NOTE — Assessment & Plan Note (Signed)
Summary: 1 month follow up/mhf   Vital Signs:  Patient profile:   65 year old male Weight:      230 pounds BMI:     33.12 O2 Sat:      97 % on Room air Temp:     97.9 degrees F oral Pulse rate:   72 / minute Pulse rhythm:   regular Resp:     16 per minute BP sitting:   108 / 68  (right arm) Cuff size:   large  Vitals Entered By: Glendell Docker CMA (August 04, 2009 9:10 AM)  O2 Flow:  Room air  CC: Rm 4- 1 Month Follow up  Comments would like to know what type of diabetes he has, left heel pian unresolved, low blood sugar 94 high 194 avg 130's   CC:  Rm 4- 1 Month Follow up .  History of Present Illness: [name] is a 65 year old male who presents today for follow up.    1) DM2- newly diagnosed- reports that he is tolerating metformin.  Has been checking sugars after breakfast- low 90 high 190.  He denies symptomatic hypoglycemia.   Reports + eye exam in April-normal per patient.    2) Elevated PSA- pt is scheduled for biopsy in september.  This will be coordinated with his colo off of coumadin.       Allergies (verified): No Known Drug Allergies  Physical Exam  General:  Well-developed,well-nourished,in no acute distress; alert,appropriate and cooperative throughout examination Head:  Normocephalic and atraumatic without obvious abnormalities. No apparent alopecia or balding. Lungs:  Normal respiratory effort, chest expands symmetrically. Lungs are clear to auscultation, no crackles or wheezes. Heart:  Normal rate and regular rhythm. S1 and S2 normal without gallop, murmur, click, rub or other extra sounds.  Diabetes Management Exam:    Foot Exam (with socks and/or shoes not present):       Sensory-Monofilament:          Left foot: normal          Right foot: diminished       Inspection:          Left foot: normal          Right foot: normal    Eye Exam:       Eye Exam done elsewhere          Date: 05/12/2009          Results: normal   Impression &  Recommendations:  Problem # 1:  DM (ICD-250.00) Assessment Improved Clinically improving, continue metformin.  F/u in 2 months- will repeat A1C at that time. His updated medication list for this problem includes:    Benicar Hct 20-12.5 Mg Tabs (Olmesartan medoxomil-hctz) .Marland Kitchen... Take 1 tablet by mouth once a day    Metformin Hcl 500 Mg Tabs (Metformin hcl) ..... One tablet by mouth daily x 1 week, then increase to one tablet by mouth twice daily    Aspirin 81 Mg Tbec (Aspirin) ..... One tablet by mouth daily  Problem # 2:  PSA, INCREASED (ICD-790.93) Assessment: New Scheduled for prostate biopsy with Urology (colo to be done the same day so as to minimize time off of coumadin)  Complete Medication List: 1)  Toprol Xl 25 Mg Xr24h-tab (Metoprolol succinate) .... Take 1 tablet by mouth once a day 2)  Benicar Hct 20-12.5 Mg Tabs (Olmesartan medoxomil-hctz) .... Take 1 tablet by mouth once a day 3)  Niaspan 500 Mg Cr-tabs (Niacin (  antihyperlipidemic)) .... Take 1 tablet by mouth once a day at bedtime 4)  Crestor 10 Mg Tabs (Rosuvastatin calcium) .... Take one tablet by mouth daily. 5)  Jantoven 5 Mg Tabs (Warfarin sodium) .... Take as directecd 6)  Colcrys 0.6 Mg Tabs (Colchicine) .... 2 tabs by mouth x 1 at start of gout flare, may take one tablet 1 hour later as needed 7)  Metformin Hcl 500 Mg Tabs (Metformin hcl) .... One tablet by mouth daily x 1 week, then increase to one tablet by mouth twice daily 8)  Freestyle Lite Test Strp (Glucose blood) .... Test blood sugar once a day. 9)  Freestyle Lancets Misc (Lancets) .... Check blood sugar once a day. 10)  Aspirin 81 Mg Tbec (Aspirin) .... One tablet by mouth daily  Patient Instructions: 1)  Follow up in September. 2)  Good luck with your upcoming procedures. 3)  Have a nice summer!  Current Allergies (reviewed today): No known allergies

## 2010-03-02 NOTE — Medication Information (Signed)
Summary: restart coumadin and lovenox/lwb  Anticoagulant Therapy  Managed by: Bethena Midget, RN, BSN Referring MD: Shawnie Pons MD Supervising MD: Ladona Ridgel MD, Sharlot Gowda Indication 1: Aortic Valve Replacement (ICD-V43.3) Indication 2: Aortic Valve Disorder (ICD-424.1) Lab Used: [facility] Site: [facility] INR POC 1.1 INR RANGE 2 - 3  Dietary changes: no    Health status changes: no    Bleeding/hemorrhagic complications: no    Recent/future hospitalizations: yes       Details: biopsy/colonoscopy last week  Any changes in medication regimen? yes       Details: Lovenox received 9/4 and 9/5  Recent/future dental: yes     Details: two extractions yesterday  Any missed doses?: yes     Details: stopped coumadin due to biopsy, last dose 9/1 and didn't restart coumadin post procedure states instructions were not clear.   Is patient compliant with meds? yes       Allergies: No Known Drug Allergies  Anticoagulation Management History:      The patient is taking warfarin and comes in today for a routine follow up visit.  Positive risk factors for bleeding include presence of serious comorbidities.  Negative risk factors for bleeding include an age less than 11 years old.  The bleeding index is 'intermediate risk'.  Positive CHADS2 values include History of HTN and History of Diabetes.  Negative CHADS2 values include Age > 82 years old.  The start date was 11/11/2003.  Anticoagulation responsible Kody Vigil: Ladona Ridgel MD, Sharlot Gowda.  INR POC: 1.1.  Cuvette Lot#: [id number].  Exp: 10/2010.    Anticoagulation Management Assessment/Plan:      The patient's current anticoagulation dose is Jantoven 5 mg tabs: Take as directecd.  The target INR is 2 - 3.  The next INR is due 10/11/2009.  Anticoagulation instructions were given to patient.  Results were reviewed/authorized by Bethena Midget, RN, BSN.  He was notified by Bethena Midget, RN, BSN.         Prior Anticoagulation Instructions: INR 2.5  Continue same  dose of 1 tablet every day except 1 1/2 tablets on Monday.  Take last dose of Coumadin on 8/31.  Do not take any Coumadin or Lovenox on 9/1, 9/2 or 9/3.   Take Lovenox 150mg  once daily in the morning on 9/4 and 9/5.  Procedure on 9/6.  Restart Coumadin at previous dose when okay with MDs.    Current Anticoagulation Instructions: INR 1.1 Today take 10mg s (2 pills) Friday take 7.5mg s ( 1.5 pills) Saturday take 7.5mg s ( 1.5 pills) Sunday take 5mgs (1 pill)  Monday- appt Restart Lovenox 100mgs subcutaneously in Abdomen one injection every 12 hours, rotate sites.  First injection given in office. Start tonight at 10 pm with 12 hr interval.   Prescriptions: LOVENOX 100 MG/ML SOLN (ENOXAPARIN SODIUM) Inject 100mgs subcutaneously every 12 hours  #10 x 1   Entered by:   Monica Wilson PharmD   Authorized by:   Thomas David Stuckey, MD, FACC   Signed by:   Monica Wilson PharmD on 10/07/2009   Method used:   Electronically to        [facility]* (retail)       [address]       [place], [place]  [address]       Ph: [phone]       Fax: [phone]   RxID:   [id number]

## 2010-03-02 NOTE — Progress Notes (Signed)
Summary: DOD Call  Phone Note From Other Clinic   Summary of Call: DOD Call from Dr Ragnu--The pt has a mechanical aortic valve and stopped his coumadin for dental extraction.  Dr Ballard Russell called because the pt is not covered for his valve.  The pt will have dental extraction today and come into the office 10/07/09 at 10:00 to be restarted on Coumadin and Lovenox.  Dr Clifton James took call. Julieta Gutting, RN, BSN  October 06, 2009 11:16 AM  Follow-up for Phone Call        I discussed this with the oral surgeon. The patient has been off of coumadin for 6 days and off of Lovenox for 4 days. Plans are for dental extraction today. Pt will be scheduled for coumadin clinic in am and we will restart Lovenox then.  Follow-up by: Verne Carrow, MD,  October 06, 2009 5:30 PM

## 2010-03-02 NOTE — Assessment & Plan Note (Signed)
Summary: 1 month f/u / tf,cma rsch per pt/dt--Rm 4   Vital Signs:  Patient profile:   65 year old male Height:      70 inches Weight:      228 pounds BMI:     32.83 Temp:     97.6 degrees F oral Pulse rate:   78 / minute Pulse rhythm:   regular Resp:     16 per minute BP sitting:   124 / 72  (left arm) Cuff size:   large  Vitals Entered By: Mervin Kung CMA [name] (November 26, 2009 9:55 AM) CC: Rm 4   1 month f/u. Is Patient Diabetic? Yes Pain Assessment Patient in pain? no      Comments Pt states he is only taking Metformin 1 tablet daily. Pt agrees all other med doses and directions are correct. Nicki Guadalajara Fergerson CMA [name]  November 26, 2009 9:59 AM    Primary Care Provider:  [name] FNP  CC:  Rm 4   1 month f/u.Marland Kitchen  History of Present Illness: [name] is a 65 year old male who presents today for follow up.   1. Prostate Cancer- Saw Dr. Laverle Patter (surgeon)  Not felt to be good surgical candidate due to coumadin therapy/ AVR.  Saw Dr.Manning yesterday at the direction of Dr. Retta Diones- set up a hormone therapy/radiation plan to start on Monday.  Plan for radioactive seed implant in March. Denies urinary issues.  2. DM- has not been checking sugars recently. Tries to take metformin twice daily- sometimes forgets the PM dose.  Allergies (verified): No Known Drug Allergies  Past History:  Past Medical History: Prostate Cancer diagnosed 2011  oBESITY, MODERATE (ICD-278.00) HYPERCHOLESTEROLEMIA (ICD-272.0) AORTIC STENOSIS (ICD-424.1) CAD, ARTERY BYPASS GRAFT (ICD-414.04) GOUT, HX OF (ICD-V12.2) Allergies heart murmur Hx of Hepatitis--1974 HTN  Review of Systems       see HPI  Physical Exam  General:  Well-developed,well-nourished,in no acute distress; alert,appropriate and cooperative throughout examination Head:  Normocephalic and atraumatic without obvious abnormalities. No apparent alopecia or balding. Lungs:  Normal respiratory effort,  chest expands symmetrically. Lungs are clear to auscultation, no crackles or wheezes. Heart:  Normal rate and regular rhythm. S1 and S2 normal without gallop, murmur, click, rub or other extra sounds. Extremities:  No clubbing, cyanosis, edema, or deformity noted with normal full range of motion of all joints.    Diabetes Management Exam:    Foot Exam (with socks and/or shoes not present):       Sensory-Monofilament:          Left foot: normal          Right foot: normal       Inspection:          Left foot: normal          Right foot: normal       Nails:          Left foot: normal          Right foot: normal    Eye Exam:       Eye Exam done elsewhere          Date: 05/12/2009          Results: normal          Done by: Hazle Quant   Impression & Recommendations:  Problem # 1:  ADENOCARCINOMA, PROSTATE (ICD-185) Assessment New Plan to follow up with urology for treatment as scheduled.   Problem # 2:  HYPERTENSION (ICD-401.9)  Assessment: Improved BP is stable on current meds, continue same. His updated medication list for this problem includes:    Toprol Xl 25 Mg Xr24h-tab (Metoprolol succinate) .Marland Kitchen... Take 1 tablet by mouth once a day    Benicar Hct 20-12.5 Mg Tabs (Olmesartan medoxomil-hctz) .Marland Kitchen... Take 1 tablet by mouth once a day  BP today: 124/72 Prior BP: 98/70 (10/13/2009)  Labs Reviewed: K+: 4.0 (06/16/2009) Creat: : 1.19 (06/16/2009)   Chol: 126 (05/18/2009)   HDL: 38.70 (05/18/2009)   LDL: 66 (05/21/2007)   TG: 288.0 (05/18/2009)  Complete Medication List: 1)  Toprol Xl 25 Mg Xr24h-tab (Metoprolol succinate) .... Take 1 tablet by mouth once a day 2)  Benicar Hct 20-12.5 Mg Tabs (Olmesartan medoxomil-hctz) .... Take 1 tablet by mouth once a day 3)  Niaspan 500 Mg Cr-tabs (Niacin (antihyperlipidemic)) .... Take 1 tablet by mouth once a day at bedtime 4)  Crestor 10 Mg Tabs (Rosuvastatin calcium) .... Take one tablet by mouth daily. 5)  Jantoven 5 Mg Tabs (Warfarin sodium)  .... Take as directecd 6)  Colcrys 0.6 Mg Tabs (Colchicine) .... 2 tabs by mouth x 1 at start of gout flare, may take one tablet 1 hour later as needed 7)  Metformin Hcl 500 Mg Tabs (Metformin hcl) .... One tablet by mouth twice daily 8)  Freestyle Lite Test Strp (Glucose blood) .... Test blood sugar once a day. 9)  Freestyle Lancets Misc (Lancets) .... Check blood sugar once a day.  Other Orders: TLB-Hepatic/Liver Function Pnl (80076-HEPATIC)  Patient Instructions: 1)  Please schedule a follow-up appointment in 3 months.   Orders Added: 1)  TLB-Hepatic/Liver Function Pnl [80076-HEPATIC] 2)  Est. Patient Level III [[address]]    Current Allergies (reviewed today): No known allergies

## 2010-03-02 NOTE — Miscellaneous (Signed)
Summary: Lec previsit  Clinical Lists Changes  Medications: Added new medication of MOVIPREP 100 GM  SOLR (PEG-KCL-NACL-NASULF-NA ASC-C) As per prep instructions. - Signed Rx of MOVIPREP 100 GM  SOLR (PEG-KCL-NACL-NASULF-NA ASC-C) As per prep instructions.;  #1 x 0;  Signed;  Entered by: Ulis Rias RN;  Authorized by: Rachael Fee MD;  Method used: Electronically to [facility]*, [address], [place], [place], [place]  [address], Ph: [phone], Fax: [phone] Observations: Added new observation of NKA: T (09/28/2009 9:36)    Prescriptions: MOVIPREP 100 GM  SOLR (PEG-KCL-NACL-NASULF-NA ASC-C) As per prep instructions.  #1 x 0   Entered by:   Ulis Rias RN   Authorized by:   Rachael Fee MD   Signed by:   Ulis Rias RN on 09/28/2009   Method used:   Electronically to        [facility] (retail)       [address]       [place], [place]  [address]       Ph: [phone]       Fax: [phone]   RxID:   [id number]

## 2010-03-02 NOTE — Progress Notes (Signed)
Summary: lab results  Phone Note Outgoing Call   Call placed by: [name] FNP,  Jun 17, 2009 8:56 AM Call placed to: Patient Summary of Call: Called patient, reviewed findings of elevated PSA and plans for referral to Urology.  Also discussed hyperglycemia noted on random glucose.  Discussed diabetic diet/ exercise. Could we please add on A1C if possible to yesterday's labs  (790.29) and have patient follow up in 1 month.  Initial call taken by: [name] FNP,  Jun 17, 2009 8:59 AM  Follow-up for Phone Call        Lab is unable to add A1c. Advised pt. per Arth Nicastro's instructions. Also notified him that one liver test slightly elevated and we will repeat it and get A1c in 2 weeks per Nathasha Fiorillo. Pt. voices understanding.  F/u scheduled for 07/01/09 @ 11am.  Mervin Kung CMA  Jun 17, 2009 9:34 AM   New Problems: PSA, INCREASED (ICD-790.93) HYPERGLYCEMIA (ICD-790.29)   New Problems: PSA, INCREASED (ICD-790.93) HYPERGLYCEMIA (ICD-790.29)

## 2010-03-02 NOTE — Letter (Signed)
   [place] [facility] [address] [place], [place]  [address]  [place] Phone: [phone]      October 14, 2009   [facility] [address] [place], [place] [address]  RE:  LAB RESULTS  Dear  Mr. Lartigue,  The following is an interpretation of your most recent lab tests.  Please take note of any instructions provided or changes to medications that have resulted from your lab work.    DIABETIC STUDIES:  Improved - continue management Blood Glucose: 198   HgbA1C: 7.2    Your diabetes number is at goal!  Keep up the good work.   Sincerely Yours,    [name] FNP  Appended Document:  Mailed.

## 2010-03-02 NOTE — Medication Information (Signed)
Summary: rov/ewj  Anticoagulant Therapy  Managed by: Eda Keys, PharmD Referring MD: Shawnie Pons MD Supervising MD: Shirlee Latch MD, Freida Busman Indication 1: Aortic Valve Replacement (ICD-V43.3) Indication 2: Aortic Valve Disorder (ICD-424.1) Lab Used: [facility] Site: [facility] INR POC 2.2 INR RANGE 2 - 3  Dietary changes: no    Health status changes: no    Bleeding/hemorrhagic complications: no    Recent/future hospitalizations: no    Any changes in medication regimen? no    Recent/future dental: no  Any missed doses?: no       Is patient compliant with meds? yes       Allergies: No Known Drug Allergies  Anticoagulation Management History:      The patient is taking warfarin and comes in today for a routine follow up visit.  Negative risk factors for bleeding include an age less than 74 years old.  The bleeding index is 'low risk'.  Negative CHADS2 values include Age > 50 years old.  The start date was 11/11/2003.  Anticoagulation responsible provider: Shirlee Latch MD, [place].  INR POC: 2.2.  Cuvette Lot#: [id number].  Exp: 08/2010.    Anticoagulation Management Assessment/Plan:      The patient's current anticoagulation dose is Jantoven 5 mg tabs: Take as directecd.  The target INR is 2 - 3.  The next INR is due 07/13/2009.  Anticoagulation instructions were given to patient.  Results were reviewed/authorized by Eda Keys, PharmD.  He was notified by Eda Keys.         Prior Anticoagulation Instructions: INR 2.9  Continue on same dosage 1 tablet daily except 1.5 tablets on Mondays.  Recheck in 4 weeks.    Current Anticoagulation Instructions: INR 2.2  Continue taking 1.5 tablets on Monday and 1 tablet all other days.  Return to clinic in 4 weeks.

## 2010-03-02 NOTE — Miscellaneous (Signed)
Summary: urine microalbumin  Clinical Lists Changes  Orders: Added new Service order of Specimen Handling (04540) - Signed Added new Test order of T-Urine Microalbumin w/creat. ratio [phone]) - Signed

## 2010-03-02 NOTE — Medication Information (Signed)
Summary: rov/ln  Anticoagulant Therapy  Managed by: Weston Brass, PharmD Referring MD: Shawnie Pons MD Supervising MD: Daleen Squibb MD, Maisie Fus Indication 1: Aortic Valve Replacement (ICD-V43.3) Indication 2: Aortic Valve Disorder (ICD-424.1) Lab Used: [facility]  Site: [facility] INR POC 2.5 INR RANGE 2 - 3  Dietary changes: no    Health status changes: yes       Details: having procedures done in September  Bleeding/hemorrhagic complications: no    Recent/future hospitalizations: no    Any changes in medication regimen? no    Recent/future dental: no  Any missed doses?: no       Is patient compliant with meds? yes       Allergies: No Known Drug Allergies  Anticoagulation Management History:      The patient is taking warfarin and comes in today for a routine follow up visit.  Positive risk factors for bleeding include presence of serious comorbidities.  Negative risk factors for bleeding include an age less than 66 years old.  The bleeding index is 'intermediate risk'.  Positive CHADS2 values include History of HTN and History of Diabetes.  Negative CHADS2 values include Age > 32 years old.  The start date was 11/11/2003.  Anticoagulation responsible provider: Daleen Squibb MD, Maisie Fus.  INR POC: 2.5.  Cuvette Lot#: [id number].  Exp: 10/2010.    Anticoagulation Management Assessment/Plan:      The patient's current anticoagulation dose is Jantoven 5 mg tabs: Take as directecd.  The target INR is 2 - 3.  The next INR is due 10/15/2009.  Anticoagulation instructions were given to patient.  Results were reviewed/authorized by Weston Brass, PharmD.  He was notified by Weston Brass PharmD.         Prior Anticoagulation Instructions: INR 2.4  Continue same regimen of 1.5 tabs on Monday and 1 tab all other days.  Re-check INR in 4 weeks.    Current Anticoagulation Instructions: INR 2.5  Continue same dose of 1 tablet every day except 1 1/2 tablets on Monday.  Take last dose of Coumadin on 8/31.   Do not take any Coumadin or Lovenox on 9/1, 9/2 or 9/3.   Take Lovenox 150mg  once daily in the morning on 9/4 and 9/5.  Procedure on 9/6.  Restart Coumadin at previous dose when okay with MDs.   Prescriptions: LOVENOX 150 MG/ML SOLN (ENOXAPARIN SODIUM) Inject one syringe subcutaneously into abdomen daily  #2 syringes x 0   Entered by:   Weston Brass PharmD   Authorized by:   Ronaldo Miyamoto, MD, [facility]   Signed by:   Weston Brass PharmD on 09/07/2009   Method used:   Electronically to        [facility] (retail)       [address]       [place], [place]  [address]       Ph: [phone]       Fax: [phone]   RxID:   [id number]

## 2010-03-02 NOTE — Op Note (Signed)
[facility]   Imported By: Sherian Rein 10/14/2009 10:58:02  _____________________________________________________________________  External Attachment:    Type:   Image     Comment:   External Document

## 2010-03-02 NOTE — Letter (Signed)
Summary: [facility]   Imported By: Lanelle Bal 12/11/2009 09:16:59  _____________________________________________________________________  External Attachment:    Type:   Image     Comment:   External Document

## 2010-03-02 NOTE — Progress Notes (Signed)
Summary: Colon  Phone Note Outgoing Call Call back at Home Phone [phone]   Call placed by: Chales Abrahams CMA [name],  July 28, 2009 9:34 AM Summary of Call: pt scheduled for Colon and previsit. Anticoag letter sent to Dr Riley Kill.  Pt also scheduled for the US biopsy with Dr Retta Diones.  They will contact the pt.  Dr Riley Kill will contact the pt regarding coumadin and the lovenox bridge.  pt aware previsit letter mailed Initial call taken by: Chales Abrahams CMA [name],  July 28, 2009 9:36 AM

## 2010-03-02 NOTE — Medication Information (Signed)
Summary: rov/tm  Anticoagulant Therapy  Managed by: Bethena Midget, RN, BSN Referring MD: Shawnie Pons MD Supervising MD: Tenny Craw MD, Gunnar Fusi Indication 1: Aortic Valve Replacement (ICD-V43.3) Indication 2: Aortic Valve Disorder (ICD-424.1) Lab Used: [facility] Site: [facility] INR POC 1.7 INR RANGE 2 - 3  Dietary changes: no    Health status changes: no    Bleeding/hemorrhagic complications: yes       Details: S/p colonscopy, has had severe bleeding PR over the past 2 days. Pt feels fatigued.   Recent/future hospitalizations: no    Any changes in medication regimen? no    Recent/future dental: no  Any missed doses?: yes     Details: Pt did not take Lovenox last night but has taken Coumadin dose because of bleeding  Is patient compliant with meds? yes      Comments: Telephoned Patty, Dr Christella Hartigan CMA and informed her of the above: approx 10 bloody stools yesterday, some were not even stools some were only blood per pt and he also had one this AM  before coming to this appt. She states that anticoagulation would be for Cardiologist to dose and to have pt call them about bleeding. Spoke with Leotis Shames, Dr Rosalyn Charters nurse and she states we have to keep him anticoagulated due to his valve and have pt f/u with GI.   Allergies: No Known Drug Allergies  Anticoagulation Management History:      The patient is taking warfarin and comes in today for a routine follow up visit.  Positive risk factors for bleeding include presence of serious comorbidities.  Negative risk factors for bleeding include an age less than 60 years old.  The bleeding index is 'intermediate risk'.  Positive CHADS2 values include History of HTN and History of Diabetes.  Negative CHADS2 values include Age > 73 years old.  The start date was 11/11/2003.  Anticoagulation responsible Lanard Arguijo: Tenny Craw MD, Gunnar Fusi.  INR POC: 1.7.  Cuvette Lot#: [id number].  Exp: 12/2010.    Anticoagulation Management Assessment/Plan:      The patient's  current anticoagulation dose is Jantoven 5 mg tabs: Take as directecd.  The target INR is 2 - 3.  The next INR is due 10/13/2009.  Anticoagulation instructions were given to patient.  Results were reviewed/authorized by Bethena Midget, RN, BSN.  He was notified by Bethena Midget, RN, BSN.         Prior Anticoagulation Instructions: INR 1.1 Today take 10mg s (2 pills) Friday take 7.5mg s ( 1.5 pills) Saturday take 7.5mg s ( 1.5 pills) Sunday take 5mg s (1 pill)  Monday- appt Restart Lovenox 100mg s subcutaneously in Abdomen one injection every 12 hours, rotate sites.  First injection given in office. Start tonight at 10 pm with 12 hr interval.    Current Anticoagulation Instructions: INR 1.7  Today resume regular dose,  5mg s everyday except 7.5mg s on Mondays. Restart Lovenox 100mg s every 12 hours take next injection tonight at 10pm. Injection given in office. Instructed pt to call Dr Christella Hartigan office and make them aware as well as I have already informed CMA.

## 2010-03-02 NOTE — Letter (Signed)
Summary: Previsit letter  [facility]  [address] [place], [place] [address]   Phone: [phone]  Fax: [phone]       07/28/2009 MRN: [id number]  [name] [address] [place], [place]  [address]  Dear [name],  Welcome to the Gastroenterology Division at [facility].    You are scheduled to see a nurse for your pre-procedure visit on 09/28/09 at 9:30 am on the 3rd floor at [facility], [address]  We ask that you try to arrive at our office 15 minutes prior to your appointment time to allow for check-in.  Your nurse visit will consist of discussing your medical and surgical history, your immediate family medical history, and your medications.    Please bring a complete list of all your medications or, if you prefer, bring the medication bottles and we will list them.  We will need to be aware of both prescribed and over the counter drugs.  We will need to know exact dosage information as well.  If you are on blood thinners (Coumadin, Plavix, Aggrenox, Ticlid, etc.) please call our office today/prior to your appointment, as we need to consult with your physician about holding your medication.   Please be prepared to read and sign documents such as consent forms, a financial agreement, and acknowledgement forms.  If necessary, and with your consent, a friend or relative is welcome to sit-in on the nurse visit with you.  Please bring your insurance card so that we may make a copy of it.  If your insurance requires a referral to see a specialist, please bring your referral form from your primary care physician.  No co-pay is required for this nurse visit.     If you cannot keep your appointment, please call [phone] to cancel or reschedule prior to your appointment date.  This allows [name] the opportunity to schedule an appointment for another patient in need of care.    Thank you for choosing [facility] for your medical needs.   We appreciate the opportunity to care for you.  Please visit [name] at our website  to learn more about our practice.                     Sincerely.                                                                                                                   The Gastroenterology Division  Appended Document: Previsit letter letter mailed

## 2010-03-02 NOTE — Letter (Signed)
Summary: Diabetic Instructions  [facility]  [address] [place], [place] [address]   Phone: [phone]  Fax: [phone]    LEN Dant 11-08-1945 MRN: [id number]   _  _   ORAL DIABETIC MEDICATION INSTRUCTIONS  The day before your procedure:   Take your diabetic pill as you do normally  The day of your procedure:   Do not take your diabetic pill    We will check your blood sugar levels during the admission process and again in Recovery before discharging you home  ________________________________________________________________________  _  _   INSULIN (LONG ACTING) MEDICATION INSTRUCTIONS (Lantus, NPH, 70/30, Humulin, Novolin-N)   The day before your procedure:   Take  your regular evening dose    The day of your procedure:   Do not take your morning dose    _  _   INSULIN (SHORT ACTING) MEDICATION INSTRUCTIONS (Regular, Humulog, Novolog)   The day before your procedure:   Do not take your evening dose   The day of your procedure:   Do not take your morning dose   _  _   INSULIN PUMP MEDICATION INSTRUCTIONS  We will contact the physician managing your diabetic care for written dosage instructions for the day before your procedure and the day of your procedure.  Once we have received the instructions, we will contact you.

## 2010-03-02 NOTE — Progress Notes (Signed)
Summary: procedure ?'s  Phone Note Call from Patient Call back at Home Phone [phone]   Caller: Patient Call For: Dr. Christella Hartigan Reason for Call: Talk to Nurse Summary of Call: procedure ?'s Initial call taken by: Vallarie Mare,  October 07, 2009 2:02 PM  Follow-up for Phone Call        I left message on machine to call back Chales Abrahams CMA [name]  October 07, 2009 2:18 PM   pt returned call with a question about some clear drainiage he has had since his Colon 2 days ago.  No blood, fever, or abd pain.  No odor or any other symptoms.  I advised pt to watch and make sure he doesnt develop any fever or rectal bleeding and I will consult with Dr Christella Hartigan about further recommendations. Follow-up by: Chales Abrahams CMA [name],  October 07, 2009 4:50 PM  Additional Follow-up for Phone Call Additional follow up Details #1::        I agree, just observe and see if this gets better Additional Follow-up by: Rachael Fee MD,  October 07, 2009 8:08 PM    Additional Follow-up for Phone Call Additional follow up Details #2::    pt aware Follow-up by: Chales Abrahams CMA [name],  October 08, 2009 8:24 AM

## 2010-03-02 NOTE — Consult Note (Signed)
Summary: [facility]  [facility]   Imported By: Lester  07/20/2009 09:56:32  _____________________________________________________________________  External Attachment:    Type:   Image     Comment:   External Document  Appended Document: [facility] He should be bridged while holding medications for the procedures.  Do you know when these are being scheduled?  TS  Appended Document: [facility] I spoke with the pt and both prostate biopsy and colonoscopy are scheduled on 10/05/09 at this time. The pt is aware that he will need lovenox managed by the coumadin clinic during this time. The coumadin clinic is aware that the pt will need to be bridged.

## 2010-03-03 ENCOUNTER — Telehealth: Payer: Self-pay | Admitting: Family

## 2010-03-03 NOTE — Progress Notes (Signed)
Summary: REFILL--niaspan  Phone Note Refill Request Message from:  Fax from Pharmacy on February 14, 2010 1:45 PM  Refills Requested: Medication #1:  NIASPAN 500 MG CR-TABS Take 1 tablet by mouth once a day at bedtime   Dosage confirmed as above?Dosage Confirmed   Brand Name Necessary? No   Supply Requested: 1 month   Last Refilled: 01/18/2010 [facility] [address] [place] FAX [phone]   Method Requested: Electronic Next Appointment Scheduled: 1=18-12 COUMADIN Initial call taken by: Roselle Locus,  February 14, 2010 1:46 PM    Prescriptions: NIASPAN 500 MG CR-TABS (NIACIN (ANTIHYPERLIPIDEMIC)) Take 1 tablet by mouth once a day at bedtime  #30 x 0   Entered by:   Mervin Kung CMA (AAMA)   Authorized by:   [name] FNP   Signed by:   Mervin Kung CMA (AAMA) on 02/14/2010   Method used:   Electronically to        [facility]* (retail)       [address]       [place], [place]  [address]       Ph: [phone]       Fax: [phone]   RxID:   [id number]

## 2010-03-03 NOTE — Medication Information (Signed)
Summary: rov/tp  Anticoagulant Therapy  Managed by: Geoffry Paradise, PharmD Referring MD: Shawnie Pons MD PCP: [name] FNP Supervising MD: Shirlee Latch MD, Freida Busman Indication 1: Aortic Valve Replacement (ICD-V43.3) Indication 2: Aortic Valve Disorder (ICD-424.1) Lab Used: [facility] Site: [facility] INR POC 2.2 INR RANGE 2 - 3  Dietary changes: no    Health status changes: no    Bleeding/hemorrhagic complications: no    Recent/future hospitalizations: no    Any changes in medication regimen? no    Recent/future dental: no  Any missed doses?: no       Is patient compliant with meds? yes       Allergies: No Known Drug Allergies  Anticoagulation Management History:      Positive risk factors for bleeding include presence of serious comorbidities.  Negative risk factors for bleeding include an age less than 41 years old.  The bleeding index is 'intermediate risk'.  Positive CHADS2 values include History of HTN and History of Diabetes.  Negative CHADS2 values include Age > 42 years old.  The start date was 11/11/2003.  Anticoagulation responsible Keyandra Swenson: Shirlee Latch MD, [place].  INR POC: 2.2.  Cuvette Lot#: [id number].  Exp: 03/2011.    Anticoagulation Management Assessment/Plan:      The patient's current anticoagulation dose is Jantoven 5 mg tabs: Take as directecd.  The target INR is 2 - 3.  The next INR is due 02/16/2010.  Anticoagulation instructions were given to patient.  Results were reviewed/authorized by Geoffry Paradise, PharmD.         Prior Anticoagulation Instructions: INR 2.1  Continue same dose of 1 tablet every day except 1 1/2 tablets on Monday.  Recheck INR in 4 weeks.   Current Anticoagulation Instructions: INR:  2.2  Your INR is at goal today.  Please continue taking your coumadin at 1 tablet everyday except 1.5 tablets on Mondays.  Return to clinic in 4 weeks for another INR check.

## 2010-03-03 NOTE — Medication Information (Signed)
Summary: ROV  Anticoagulant Therapy  Managed by: Weston Brass, PharmD Referring MD: Shawnie Pons MD PCP: [name] FNP Supervising MD: Graciela Husbands MD, Viviann Spare Indication 1: Aortic Valve Replacement (ICD-V43.3) Indication 2: Aortic Valve Disorder (ICD-424.1) Lab Used: [facility] Site: [facility] INR POC 1.9 INR RANGE 2 - 3  Dietary changes: no    Health status changes: no    Bleeding/hemorrhagic complications: no    Recent/future hospitalizations: no    Any changes in medication regimen? no    Recent/future dental: no  Any missed doses?: no       Is patient compliant with meds? yes       Allergies: No Known Drug Allergies  Anticoagulation Management History:      The patient is taking warfarin and comes in today for a routine follow up visit.  Positive risk factors for bleeding include presence of serious comorbidities.  Negative risk factors for bleeding include an age less than 64 years old.  The bleeding index is 'intermediate risk'.  Positive CHADS2 values include History of HTN and History of Diabetes.  Negative CHADS2 values include Age > 80 years old.  The start date was 11/11/2003.  Anticoagulation responsible provider: Graciela Husbands MD, Viviann Spare.  INR POC: 1.9.  Cuvette Lot#: [id number].  Exp: 03/2011.    Anticoagulation Management Assessment/Plan:      The patient's current anticoagulation dose is Jantoven 5 mg tabs: Take as directecd.  The target INR is 2 - 3.  The next INR is due 03/16/2010.  Anticoagulation instructions were given to patient.  Results were reviewed/authorized by Weston Brass, PharmD.  He was notified by Weston Brass PharmD.         Prior Anticoagulation Instructions: INR:  2.2  Your INR is at goal today.  Please continue taking your coumadin at 1 tablet everyday except 1.5 tablets on Mondays.  Return to clinic in 4 weeks for another INR check.    Current Anticoagulation Instructions: INR 1.9  Take 1 1/2 tablets today then resume same dose of 1  tablet every day except 1 1/2 tablets on Monday.  Recheck INR in 4 weeks.

## 2010-03-09 NOTE — Progress Notes (Signed)
Summary: refill--toprol xl  Phone Note Refill Request Message from:  Fax from Pharmacy on March 03, 2010 1:15 PM  Refills Requested: Medication #1:  TOPROL XL 25 MG XR24H-TAB Take 1 tablet by mouth once a day   Dosage confirmed as above?Dosage Confirmed   Brand Name Necessary? No   Supply Requested: 1 month   Last Refilled: 02/02/2010 [facility] [address] [place] [address] fax [phone]   Method Requested: Electronic Next Appointment Scheduled: none Initial call taken by: Elba Barman,  March 03, 2010 1:16 PM    Prescriptions: TOPROL XL 25 MG XR24H-TAB (METOPROLOL SUCCINATE) Take 1 tablet by mouth once a day  #30 x 0   Entered by:   Mervin Kung CMA (AAMA)   Authorized by:   [name] FNP   Signed by:   Mervin Kung CMA (AAMA) on 03/03/2010   Method used:   Electronically to        [facility]* (retail)       [address]       [place], [place]  [address]       Ph: [phone]       Fax: [phone]   RxID:   [id number]

## 2010-03-15 DIAGNOSIS — Z954 Presence of other heart-valve replacement: Secondary | ICD-10-CM

## 2010-03-15 DIAGNOSIS — I359 Nonrheumatic aortic valve disorder, unspecified: Secondary | ICD-10-CM

## 2010-03-16 ENCOUNTER — Encounter (INDEPENDENT_AMBULATORY_CARE_PROVIDER_SITE_OTHER): Payer: Federal, State, Local not specified - PPO

## 2010-03-16 ENCOUNTER — Encounter: Payer: Self-pay | Admitting: Internal Medicine

## 2010-03-16 DIAGNOSIS — Z7901 Long term (current) use of anticoagulants: Secondary | ICD-10-CM

## 2010-03-16 DIAGNOSIS — I359 Nonrheumatic aortic valve disorder, unspecified: Secondary | ICD-10-CM

## 2010-03-16 LAB — CONVERTED CEMR LAB: POC INR: 1.9

## 2010-03-21 ENCOUNTER — Telehealth: Payer: Self-pay | Admitting: Family

## 2010-03-21 ENCOUNTER — Encounter: Payer: Self-pay | Admitting: Family

## 2010-03-21 ENCOUNTER — Inpatient Hospital Stay (HOSPITAL_BASED_OUTPATIENT_CLINIC_OR_DEPARTMENT_OTHER)
Admission: RE | Admit: 2010-03-21 | Discharge: 2010-03-21 | Disposition: A | Discharge status: Home / Self Care | Payer: Medicare Other | Source: Ambulatory Visit

## 2010-03-21 ENCOUNTER — Ambulatory Visit
Admission: RE | Admit: 2010-03-21 | Discharge: 2010-03-21 | Disposition: A | Discharge status: Home / Self Care | Payer: Federal, State, Local not specified - PPO | Source: Ambulatory Visit | Attending: Urology | Admitting: Urology

## 2010-03-21 ENCOUNTER — Ambulatory Visit (INDEPENDENT_AMBULATORY_CARE_PROVIDER_SITE_OTHER): Payer: Federal, State, Local not specified - PPO | Admitting: Family

## 2010-03-21 ENCOUNTER — Other Ambulatory Visit: Payer: Self-pay | Admitting: Urology

## 2010-03-21 DIAGNOSIS — C61 Malignant neoplasm of prostate: Secondary | ICD-10-CM

## 2010-03-21 DIAGNOSIS — E119 Type 2 diabetes mellitus without complications: Secondary | ICD-10-CM

## 2010-03-21 DIAGNOSIS — R197 Diarrhea, unspecified: Secondary | ICD-10-CM

## 2010-03-21 DIAGNOSIS — K644 Residual hemorrhoidal skin tags: Secondary | ICD-10-CM

## 2010-03-21 DIAGNOSIS — I10 Essential (primary) hypertension: Secondary | ICD-10-CM

## 2010-03-21 DIAGNOSIS — Z8719 Personal history of other diseases of the digestive system: Secondary | ICD-10-CM | POA: Insufficient documentation

## 2010-03-21 HISTORY — DX: Residual hemorrhoidal skin tags: K64.4

## 2010-03-21 LAB — CONVERTED CEMR LAB
BUN: 23 mg/dL (ref 6–23)
CO2: 26 meq/L (ref 19–32)
Calcium: 9.6 mg/dL (ref 8.4–10.5)
Eosinophils Absolute: 0.2 10*3/uL (ref 0.0–0.7)
Eosinophils Relative: 3 % (ref 0–5)
Glucose, Bld: 115 mg/dL — ABNORMAL HIGH (ref 70–99)
HCT: 36.4 % — ABNORMAL LOW (ref 39.0–52.0)
Hemoglobin: 13.3 g/dL (ref 13.0–17.0)
Hgb A1c MFr Bld: 7.7 % — ABNORMAL HIGH (ref <5.7)
Lymphocytes Relative: 13 % (ref 12–46)
Lymphs Abs: 1.1 10*3/uL (ref 0.7–4.0)
MCV: 87.7 fL (ref 78.0–100.0)
Monocytes Absolute: 0.6 10*3/uL (ref 0.1–1.0)
Monocytes Relative: 7 % (ref 3–12)
Platelets: 203 10*3/uL (ref 150–400)
RBC: 4.15 M/uL — ABNORMAL LOW (ref 4.22–5.81)
WBC: 8.1 10*3/uL (ref 4.0–10.5)

## 2010-03-21 LAB — HM DIABETES FOOT EXAM

## 2010-03-22 ENCOUNTER — Telehealth: Payer: Self-pay | Admitting: Family

## 2010-03-23 NOTE — Letter (Signed)
Summary: [facility] Office Visit   [facility] Office Visit   Imported By: Roderic Ovens 03/17/2010 10:09:55  _____________________________________________________________________  External Attachment:    Type:   Image     Comment:   External Document

## 2010-03-23 NOTE — Medication Information (Signed)
Summary: Coumadin Clinic  Anticoagulant Therapy  Managed by: Weston Brass, PharmD Referring MD: Shawnie Pons MD PCP: [name] FNP Supervising MD: Gala Romney MD, Reuel Boom Indication 1: Aortic Valve Replacement (ICD-V43.3) Indication 2: Aortic Valve Disorder (ICD-424.1) Lab Used: [facility] Site: [facility] INR POC 1.9 INR RANGE 2 - 3  Dietary changes: yes       Details: less greens   Health status changes: no    Bleeding/hemorrhagic complications: no    Recent/future hospitalizations: no    Any changes in medication regimen? yes       Details: 2 more rounds of radiation then seed implants for prostate cancer  Recent/future dental: no  Any missed doses?: no       Is patient compliant with meds? yes       Allergies: No Known Drug Allergies  Anticoagulation Management History:      The patient is taking warfarin and comes in today for a routine follow up visit.  Positive risk factors for bleeding include presence of serious comorbidities.  Negative risk factors for bleeding include an age less than 5 years old.  The bleeding index is 'intermediate risk'.  Positive CHADS2 values include History of HTN and History of Diabetes.  Negative CHADS2 values include Age > 106 years old.  The start date was 11/11/2003.  Anticoagulation responsible provider: Bensimhon MD, Reuel Boom.  INR POC: 1.9.  Cuvette Lot#: [id number].  Exp: 01/2011.    Anticoagulation Management Assessment/Plan:      The patient's current anticoagulation dose is Jantoven 5 mg tabs: Take as directecd.  The target INR is 2 - 3.  The next INR is due 03/28/2010.  Anticoagulation instructions were given to patient.  Results were reviewed/authorized by Weston Brass, PharmD.  He was notified by Weston Brass PharmD.         Prior Anticoagulation Instructions: INR 1.9  Take 1 1/2 tablets today then resume same dose of 1 tablet every day except 1 1/2 tablets on Monday.  Recheck INR in 4 weeks.   Current Anticoagulation  Instructions: INR 1.9  Take 1 1/2 tablets today then resume same dose of 1 tablet every day except 1 1/2 tablets on Monday.  Recheck INR on 2/27

## 2010-03-24 ENCOUNTER — Encounter: Payer: Self-pay | Admitting: Family

## 2010-03-24 LAB — HM DIABETES EYE EXAM: HM Diabetic Eye Exam: NORMAL

## 2010-03-28 ENCOUNTER — Encounter: Payer: Self-pay | Admitting: Cardiology

## 2010-03-28 ENCOUNTER — Encounter (INDEPENDENT_AMBULATORY_CARE_PROVIDER_SITE_OTHER): Payer: Federal, State, Local not specified - PPO

## 2010-03-28 ENCOUNTER — Ambulatory Visit (INDEPENDENT_AMBULATORY_CARE_PROVIDER_SITE_OTHER): Payer: Federal, State, Local not specified - PPO | Admitting: Cardiology

## 2010-03-28 DIAGNOSIS — Z7901 Long term (current) use of anticoagulants: Secondary | ICD-10-CM

## 2010-03-28 DIAGNOSIS — I359 Nonrheumatic aortic valve disorder, unspecified: Secondary | ICD-10-CM

## 2010-03-28 DIAGNOSIS — E7800 Pure hypercholesterolemia, unspecified: Secondary | ICD-10-CM

## 2010-03-28 DIAGNOSIS — I2510 Atherosclerotic heart disease of native coronary artery without angina pectoris: Secondary | ICD-10-CM

## 2010-03-28 LAB — CONVERTED CEMR LAB: POC INR: 1.8

## 2010-03-29 NOTE — Assessment & Plan Note (Signed)
Summary: follow up on diabetes and blood pressure/ss--rm 5   Vital Signs:  Patient profile:   65 year old male Height:      70 inches Weight:      231.25 pounds BMI:     33.30 Temp:     98.1 degrees F oral Pulse rate:   84 / minute Pulse rhythm:   regular Resp:     16 per minute BP sitting:   118 / 70  (right arm) Cuff size:   large  Vitals Entered By: Mervin Kung CMA [name] (March 21, 2010 2:30 PM) CC: Pt here for follow up of diabetes and blood pressure. Is Patient Diabetic? Yes Comments Pt currently undergoing radiation, has diarrhea as a side effect. Nicki Guadalajara Fergerson CMA [name]  March 21, 2010 2:40 PM    Primary Care Provider:  [name] FNP  CC:  Pt here for follow up of diabetes and blood pressure.Marland Kitchen  History of Present Illness: [name] is a 65 year old male who presents today for routine follow up.  1.  Prostate Cancer- has been following with Dr.  Retta Diones (Urology) and Dr. Kathrynn Running radiation oncology. Due to hx of artificial valve, was felt that he was not a candidate for resection of prostate.  He has started instead a  "hormone shot"  on 11/15, and a  second injection is scheduled on  3/6.  Jan 16th- he started 5 weeks of radiation.  Finished radiation last friday.  Since he started injection, reports that he has gained weight.  Also reports that he has had diarrhea since he started the radiation.  Has had some associated  + rectal pain which he associated to hemorrhoids.  Has seen some bright red blood, "when my hemorrhoids go." Using OTC preparation H and otc immodium with some improvement.    2. Diabetes-  not taking sugars.  + med compliance.  "not first on my list these days."  Allergies (verified): No Known Drug Allergies  Past History:  Past Medical History: Last updated: 11/26/2009 Prostate Cancer diagnosed 2011  oBESITY, MODERATE (ICD-278.00) HYPERCHOLESTEROLEMIA (ICD-272.0) AORTIC STENOSIS (ICD-424.1) CAD, ARTERY BYPASS GRAFT  (ICD-414.04) GOUT, HX OF (ICD-V12.2) Allergies heart murmur Hx of Hepatitis--1974 HTN  Past Surgical History: Last updated: 07/27/2009 Aortic valve replacement [facility]. Coronary Artery bypass 10-2003 Rt. Hip surgery- 2006 Cholecystectomy-1990 Inguinal hernia (right) - 1999 Inguinal hernia (left) - 2002 pilonidal cyst- 1964 Tonsilectomy-child Appendectomy--1990s   Review of Systems       see HPI mild bilateral LE edema- improved per patient.  Physical Exam  General:  Tired appearing white male, awake, alert and in NAD Head:  Normocephalic and atraumatic without obvious abnormalities. No apparent alopecia or balding. Neck:  No deformities, masses, or tenderness noted. Lungs:  Normal respiratory effort, chest expands symmetrically. Lungs are clear to auscultation, no crackles or wheezes. Heart:  Normal rate and regular rhythm. + click consistent with artificial valve Rectal:  external hemorrhoid(s).    Diabetes Management Exam:    Foot Exam (with socks and/or shoes not present):       Sensory-Monofilament:          Left foot: normal          Right foot: normal       Inspection:          Left foot: normal          Right foot: normal       Nails:  Left foot: normal          Right foot: normal   Impression & Recommendations:  Problem # 1:  ADENOCARCINOMA, PROSTATE (ICD-185) Assessment Comment Only Pt is following with urology and radiation oncology.  Pt will have radioactive seed implant in early March. Cardiology to manage anticoagulation perioperatively. Support provided.   Problem # 2:  DM (ICD-250.00) Will check A1C His updated medication list for this problem includes:    Benicar Hct 20-12.5 Mg Tabs (Olmesartan medoxomil-hctz) .Marland Kitchen... Take 1 tablet by mouth once a day    Metformin Hcl 500 Mg Tabs (Metformin hcl) ..... One tablet by mouth twice daily  Orders: T-Hgb A1C ([address]-98119)  Problem # 3:  DIARRHEA (ICD-787.91) Assessment: New Likely  due to recent radiation.  Will check BMET.    Problem # 4:  HEMORRHOIDS, EXTERNAL (ICD-455.3) Assessment: New Will give trial of analpram.  Also recommended sitz baths.  Suspect episodes of bleeding are hemorrhoid related.  Will check CBC.  Problem # 5:  HYPERTENSION (ICD-401.9) Assessment: Unchanged BP remains stable on current meds.  His updated medication list for this problem includes:    Toprol Xl 25 Mg Xr24h-tab (Metoprolol succinate) .Marland Kitchen... Take 1 tablet by mouth once a day    Benicar Hct 20-12.5 Mg Tabs (Olmesartan medoxomil-hctz) .Marland Kitchen... Take 1 tablet by mouth once a day  Orders: TLB-BMP (Basic Metabolic Panel-BMET) (80048-METABOL)  BP today: 118/70 Prior BP: 124/72 (11/26/2009)  Labs Reviewed: K+: 4.0 (06/16/2009) Creat: : 1.19 (06/16/2009)   Chol: 126 (05/18/2009)   HDL: 38.70 (05/18/2009)   LDL: 66 (05/21/2007)   TG: 288.0 (05/18/2009)  Complete Medication List: 1)  Toprol Xl 25 Mg Xr24h-tab (Metoprolol succinate) .... Take 1 tablet by mouth once a day 2)  Benicar Hct 20-12.5 Mg Tabs (Olmesartan medoxomil-hctz) .... Take 1 tablet by mouth once a day 3)  Niaspan 500 Mg Cr-tabs (Niacin (antihyperlipidemic)) .... Take 1 tablet by mouth once a day at bedtime 4)  Crestor 10 Mg Tabs (Rosuvastatin calcium) .... Take one tablet by mouth daily. 5)  Jantoven 5 Mg Tabs (Warfarin sodium) .... Take as directecd 6)  Colcrys 0.6 Mg Tabs (Colchicine) .... 2 tabs by mouth x 1 at start of gout flare, may take one tablet 1 hour later as needed 7)  Metformin Hcl 500 Mg Tabs (Metformin hcl) .... One tablet by mouth twice daily 8)  Freestyle Lite Test Strp (Glucose blood) .... Test blood sugar once a day. 9)  Freestyle Lancets Misc (Lancets) .... Check blood sugar once a day. 10)  Enoxaparin Sodium 150 Mg/ml Soln (Enoxaparin sodium) .... Inject 1 syringe subcutaneously once daily as directed 11)  Analpram-hc 1-2.5 % Crea (Hydrocortisone ace-pramoxine) .... Apply to hemorrhoids twice daily  for 2  weeks  Other Orders: TLB-CBC Platelet - w/Differential (85025-CBCD)  Patient Instructions: 1)  Follow up in 3 months. 2)  Complete lab work on the first floor. 3)  Good luck with your treatment! Prescriptions: ANALPRAM-HC 1-2.5 % CREA (HYDROCORTISONE ACE-PRAMOXINE) apply to hemorrhoids twice daily  for 2 weeks  #1 x 1   Entered and Authorized by:   [name] FNP   Signed by:   [name] FNP on 03/21/2010   Method used:   Electronically to        [facility] (retail)       [address]       [address]       [place], [place]  [address]  Ph: [phone]       Fax: [phone]   RxID:   [id number]    Orders Added: 1)  TLB-BMP (Basic Metabolic Panel-BMET) [80048-METABOL] 2)  TLB-CBC Platelet - w/Differential [85025-CBCD] 3)  T-Hgb A1C [[address]-23375] 4)  Est. Patient Level IV [[address]]    Current Allergies (reviewed today): No known allergies

## 2010-03-29 NOTE — Progress Notes (Signed)
Summary: lab result  Phone Note Outgoing Call   Summary of Call: Please call patient and let him know that his A1C is 7.7.  Goal is less than 7 for his diabetes.  I would like for him to add Januvia.  Check sugars at least once a day.   Call if less than 80 or greater than 250.  Initial call taken by: [name] FNP,  March 22, 2010 8:15 AM  Follow-up for Phone Call        Pt notified. Nicki Guadalajara Fergerson CMA (AAMA)  March 22, 2010 10:58 AM     New/Updated Medications: JANUVIA 100 MG TABS (SITAGLIPTIN PHOSPHATE) one tablet by mouth daily Prescriptions: JANUVIA 100 MG TABS (SITAGLIPTIN PHOSPHATE) one tablet by mouth daily  #30 x 3   Entered and Authorized by:   [name] FNP   Signed by:   [name] FNP on 03/22/2010   Method used:   Electronically to        [facility] (retail)       [address]       [place], [place]  [address]       Ph: [phone]       Fax: [phone]   RxID:   [id number]

## 2010-03-29 NOTE — Miscellaneous (Signed)
Summary: eye exam  Clinical Lists Changes  Observations: Added new observation of DMEYEEXAMNXT: 03/2011 (03/24/2010 11:10) Added new observation of DMEYEEXMRES: normal (03/24/2010 11:10) Added new observation of EYE EXAM BY: [facility] (03/24/2010 11:10) Added new observation of DIAB EYE EX: normal (03/24/2010 11:10) Added new observation of PRIMARY MD: [name] FNP (03/24/2010 11:10)        Diabetes Management Exam:    Eye Exam:       Eye Exam done elsewhere          Date: 03/24/2010          Results: normal          Done by: [facility]

## 2010-03-29 NOTE — Progress Notes (Signed)
Summary: needs appt.  Phone Note Outgoing Call   Summary of Call: Please call patient and arrange a follow up visit.  He is due for follow up of his diabetes and blood pressure. Initial call taken by: [name] FNP,  March 21, 2010 11:01 AM  Follow-up for Phone Call        Left message with pt's son to have pt return my call. Nicki Guadalajara Fergerson CMA [name]  March 21, 2010 11:35 AM   Additional Follow-up for Phone Call Additional follow up Details #1::        Pt called back and scheduled appt for today at 2:30. Additional Follow-up by: Mervin Kung CMA (AAMA),  March 21, 2010 2:30 PM

## 2010-03-31 HISTORY — PX: OTHER SURGICAL HISTORY: SHX169

## 2010-04-01 LAB — COMPREHENSIVE METABOLIC PANEL
ALT: 40 U/L (ref 0–53)
AST: 36 U/L (ref 0–37)
CO2: 28 mEq/L (ref 19–32)
Calcium: 9.6 mg/dL (ref 8.4–10.5)
Creatinine, Ser: 1.15 mg/dL (ref 0.4–1.5)
GFR calc Af Amer: >60 mL/min (ref >60)
GFR calc non Af Amer: >60 mL/min (ref >60)
Glucose, Bld: 126 mg/dL — ABNORMAL HIGH (ref 70–99)
Sodium: 138 mEq/L (ref 135–145)
Total Protein: 7.5 g/dL (ref 6.0–8.3)

## 2010-04-01 LAB — CBC
Hemoglobin: 13.3 g/dL (ref 13.0–17.0)
MCH: 31.9 pg (ref 26.0–34.0)
MCHC: 35 g/dL (ref 30.0–36.0)
RDW: 16 % — ABNORMAL HIGH (ref 11.5–15.5)

## 2010-04-01 LAB — PROTIME-INR: Prothrombin Time: 19 seconds — ABNORMAL HIGH (ref 11.6–15.2)

## 2010-04-04 ENCOUNTER — Encounter: Payer: Self-pay | Admitting: Cardiology

## 2010-04-07 NOTE — Consult Note (Signed)
Summary: [facility]: Consultation Report  [facility]: Consultation Report   Imported By: Earl Many 03/23/2010 08:37:30  _____________________________________________________________________  External Attachment:    Type:   Image     Comment:   External Document

## 2010-04-07 NOTE — Medication Information (Signed)
Summary: rov/sp  Anticoagulant Therapy  Managed by: Louann Sjogren, PharmD Referring MD: Shawnie Pons MD PCP: [name] FNP Supervising MD: Gala Romney MD, Reuel Boom Indication 1: Aortic Valve Replacement (ICD-V43.3) Indication 2: Aortic Valve Disorder (ICD-424.1) Lab Used: [facility] Site: [facility] INR POC 1.8 INR RANGE 2 - 3  Dietary changes: no           Comments: 3/9:  Minor surgery scheduled  Allergies: No Known Drug Allergies  Anticoagulation Management History:      Positive risk factors for bleeding include an age of 65 years or older and presence of serious comorbidities.  The bleeding index is 'intermediate risk'.  Positive CHADS2 values include History of HTN and History of Diabetes.  Negative CHADS2 values include Age > 65 years old.  The start date was 11/11/2003.  Anticoagulation responsible provider: Bensimhon MD, Reuel Boom.  INR POC: 1.8.  Exp: 01/2011.    Anticoagulation Management Assessment/Plan:      The patient's current anticoagulation dose is Jantoven 5 mg tabs: Take as directecd.  The target INR is 2 - 3.  The next INR is due 04/13/2010.  Anticoagulation instructions were given to patient.  Results were reviewed/authorized by Louann Sjogren, PharmD.  He was notified by Weston Brass, PharmD.         Prior Anticoagulation Instructions: INR 1.9  Take 1 1/2 tablets today then resume same dose of 1 tablet every day except 1 1/2 tablets on Monday.  Recheck INR on 2/27  Current Anticoagulation Instructions: INR 1.8 (goal 2-3)  Sunday March 4th- Take last dose of Coumadin Monday March 5th- No Coumadin or Lovenox Tuesday March 6th- Lovenox 150mg once daily AM Wednesday March 7th- Lovenox 150mg once daily AM Thursday March 8th- Lovenox 150mg once daily AM Friday March 9th- Procedure in AM.  Resume Coumadin when okay with MD.  Restart with normal dose of 1 tablet every day except 1 1/2 tablets on Monday.    Next INR check on Wednesday, March  14th. Prescriptions: ENOXAPARIN SODIUM 150 MG/ML SOLN (ENOXAPARIN SODIUM) Inject 1 syringe subcutaneously once daily as directed  #6 x 0   Entered by:   Erika Johnson RN   Authorized by:   Thomas David Stuckey, MD, FACC   Signed by:   Erika Johnson RN on 03/28/2010   Method used:   Electronically to        [facility] (retail)       [address]       [place], [place]  [address]       Ph: [phone]       Fax: [phone]   RxID:   [id number]

## 2010-04-07 NOTE — Letter (Signed)
Summary:  [facility]: Office Simulation and Treatment Plan   Imported By: Earl Many 03/23/2010 08:36:16  _____________________________________________________________________  External Attachment:    Type:   Image     Comment:   External Document

## 2010-04-08 ENCOUNTER — Ambulatory Visit (HOSPITAL_BASED_OUTPATIENT_CLINIC_OR_DEPARTMENT_OTHER)
Admission: RE | Admit: 2010-04-08 | Discharge: 2010-04-08 | Disposition: A | Discharge status: Home / Self Care | Payer: Medicare Other | Source: Ambulatory Visit | Attending: Urology | Admitting: Urology

## 2010-04-08 ENCOUNTER — Ambulatory Visit (HOSPITAL_COMMUNITY): Payer: Medicare Other

## 2010-04-08 DIAGNOSIS — E119 Type 2 diabetes mellitus without complications: Secondary | ICD-10-CM | POA: Insufficient documentation

## 2010-04-08 DIAGNOSIS — Z79899 Other long term (current) drug therapy: Secondary | ICD-10-CM | POA: Insufficient documentation

## 2010-04-08 DIAGNOSIS — Z7901 Long term (current) use of anticoagulants: Secondary | ICD-10-CM | POA: Insufficient documentation

## 2010-04-08 DIAGNOSIS — I10 Essential (primary) hypertension: Secondary | ICD-10-CM | POA: Insufficient documentation

## 2010-04-08 DIAGNOSIS — C61 Malignant neoplasm of prostate: Secondary | ICD-10-CM | POA: Insufficient documentation

## 2010-04-08 DIAGNOSIS — I2510 Atherosclerotic heart disease of native coronary artery without angina pectoris: Secondary | ICD-10-CM | POA: Insufficient documentation

## 2010-04-08 DIAGNOSIS — Z01818 Encounter for other preprocedural examination: Secondary | ICD-10-CM | POA: Insufficient documentation

## 2010-04-08 DIAGNOSIS — Z951 Presence of aortocoronary bypass graft: Secondary | ICD-10-CM | POA: Insufficient documentation

## 2010-04-08 DIAGNOSIS — Z01812 Encounter for preprocedural laboratory examination: Secondary | ICD-10-CM | POA: Insufficient documentation

## 2010-04-08 DIAGNOSIS — Z01810 Encounter for preprocedural cardiovascular examination: Secondary | ICD-10-CM | POA: Insufficient documentation

## 2010-04-12 NOTE — Letter (Signed)
Summary: Eye Exam/[facility]  Eye Exam/[facility]   Imported By: Maryln Gottron 04/04/2010 09:27:16  _____________________________________________________________________  External Attachment:    Type:   Image     Comment:   External Document

## 2010-04-13 ENCOUNTER — Encounter: Payer: Self-pay | Admitting: Cardiology

## 2010-04-13 ENCOUNTER — Encounter (INDEPENDENT_AMBULATORY_CARE_PROVIDER_SITE_OTHER): Payer: Federal, State, Local not specified - PPO

## 2010-04-13 DIAGNOSIS — Z954 Presence of other heart-valve replacement: Secondary | ICD-10-CM

## 2010-04-13 DIAGNOSIS — I359 Nonrheumatic aortic valve disorder, unspecified: Secondary | ICD-10-CM

## 2010-04-13 DIAGNOSIS — Z7901 Long term (current) use of anticoagulants: Secondary | ICD-10-CM

## 2010-04-13 LAB — CONVERTED CEMR LAB: POC INR: 1.1

## 2010-04-14 LAB — GLUCOSE, CAPILLARY: Glucose-Capillary: 132 mg/dL — ABNORMAL HIGH (ref 70–99)

## 2010-04-18 ENCOUNTER — Encounter (INDEPENDENT_AMBULATORY_CARE_PROVIDER_SITE_OTHER): Payer: Federal, State, Local not specified - PPO

## 2010-04-18 ENCOUNTER — Encounter: Payer: Self-pay | Admitting: Internal Medicine

## 2010-04-18 DIAGNOSIS — I359 Nonrheumatic aortic valve disorder, unspecified: Secondary | ICD-10-CM

## 2010-04-18 DIAGNOSIS — Z7901 Long term (current) use of anticoagulants: Secondary | ICD-10-CM

## 2010-04-18 DIAGNOSIS — Z954 Presence of other heart-valve replacement: Secondary | ICD-10-CM

## 2010-04-18 LAB — CONVERTED CEMR LAB: POC INR: 1.6

## 2010-04-19 NOTE — Medication Information (Signed)
Summary: rov/tm  Anticoagulant Therapy  Managed by: Bethena Midget, RN, BSN Referring MD: Shawnie Pons MD PCP: [name] FNP Supervising MD: Gala Romney MD, Reuel Boom Indication 1: Aortic Valve Replacement (ICD-V43.3) Indication 2: Aortic Valve Disorder (ICD-424.1) Lab Used: [facility] Site: [address] INR POC 1.1 INR RANGE 2 - 3      Any changes in medication regimen? yes       Details: Took Cipro BID after implantation last dose on Monday,.      Comments: Restarted Friday after being off for 5  days for seed implantation on Friday. Took Lovenox post procedure Sat, Sun and Mon.   Allergies: No Known Drug Allergies  Anticoagulation Management History:      The patient is taking warfarin and comes in today for a routine follow up visit.  Positive risk factors for bleeding include an age of 65 years or older and presence of serious comorbidities.  The bleeding index is 'intermediate risk'.  Positive CHADS2 values include History of HTN and History of Diabetes.  Negative CHADS2 values include Age > 20 years old.  The start date was 11/11/2003.  Anticoagulation responsible provider: Bensimhon MD, Reuel Boom.  INR POC: 1.1.  Cuvette Lot#: [id number].  Exp: 01/2011.    Anticoagulation Management Assessment/Plan:      The patient's current anticoagulation dose is Jantoven 5 mg tabs: Take as directecd.  The target INR is 2 - 3.  The next INR is due 04/18/2010.  Anticoagulation instructions were given to patient.  Results were reviewed/authorized by Bethena Midget, RN, BSN.  He was notified by Bethena Midget, RN, BSN.         Prior Anticoagulation Instructions: INR 1.8 (goal 2-3)  Sunday March 4th- Take last dose of Coumadin Monday March 5th- No Coumadin or Lovenox Tuesday March 6th- Lovenox 150mg once daily AM Wednesday March 7th- Lovenox 150mg once daily AM Thursday March 8th- Lovenox 150mg once daily AM Friday March 9th- Procedure in AM.  Resume Coumadin when okay with MD.   Restart with normal dose of 1 tablet every day except 1 1/2 tablets on Monday.    Next INR check on Wednesday, March 14th.  Current Anticoagulation Instructions: INR 1.1  Coumadin 5mg tabs  Take 2 tabs on WED and THUR  then 1.5 tab on MON and FRI 1 tab on all other days and continue Lovenox for 5 more days Prescriptions: ENOXAPARIN SODIUM 150 MG/ML SOLN (ENOXAPARIN SODIUM) Inject 1 syringe subcutaneously once daily as directed  #5 x 0   Entered by:   Lisa Curran, PharmD, BCPS, CPP   Authorized by:   Nazir Hacker C Wall, MD, FACC   Signed by:   Lisa Curran, PharmD, BCPS, CPP on 04/13/2010   Method used:   Electronically to        [facility]* (retail)       [address]       [place], [place]  [address]       Ph: [phone]       Fax: [phone]   RxID:   [id number]

## 2010-04-25 ENCOUNTER — Ambulatory Visit (INDEPENDENT_AMBULATORY_CARE_PROVIDER_SITE_OTHER): Payer: Medicare Other | Admitting: *Deleted

## 2010-04-25 DIAGNOSIS — Z954 Presence of other heart-valve replacement: Secondary | ICD-10-CM

## 2010-04-25 DIAGNOSIS — I359 Nonrheumatic aortic valve disorder, unspecified: Secondary | ICD-10-CM

## 2010-04-25 LAB — POCT INR: INR: 1.8

## 2010-04-25 NOTE — Patient Instructions (Signed)
INR 1.8 Today take 2 pills then change dose to 1 pill everyday except 1.5 pills on Mondays, Wednesdays, and Fridays. Recheck in 10 days.

## 2010-04-28 ENCOUNTER — Ambulatory Visit: Payer: Medicare Other | Attending: Radiation Oncology | Admitting: Radiation Oncology

## 2010-04-28 ENCOUNTER — Telehealth: Payer: Self-pay | Admitting: Family

## 2010-04-28 DIAGNOSIS — R351 Nocturia: Secondary | ICD-10-CM | POA: Insufficient documentation

## 2010-04-28 DIAGNOSIS — C61 Malignant neoplasm of prostate: Secondary | ICD-10-CM | POA: Insufficient documentation

## 2010-04-28 DIAGNOSIS — K6289 Other specified diseases of anus and rectum: Secondary | ICD-10-CM | POA: Insufficient documentation

## 2010-04-28 MED ORDER — METOPROLOL SUCCINATE ER 25 MG PO TB24: 25.0000 mg | ORAL_TABLET | Freq: Every day | ORAL | Status: DC

## 2010-04-28 NOTE — Telephone Encounter (Signed)
Refill- toprol xl 25mg  xr24h-tab. Take one tablet each day. Qty 30. Last fill 3.1.12

## 2010-04-28 NOTE — Medication Information (Signed)
Summary: rov [facility]  Anticoagulant Therapy  Managed by: Bethena Midget, RN, BSN Referring MD: Shawnie Pons MD PCP: [name] FNP Supervising MD: Tenny Craw MD, Gunnar Fusi Indication 1: Aortic Valve Replacement (ICD-V43.3) Indication 2: Aortic Valve Disorder (ICD-424.1) Lab Used: [facility] Site: [facility] INR POC 1.6 INR RANGE 2 - 3  Dietary changes: no    Health status changes: no    Bleeding/hemorrhagic complications: no    Recent/future hospitalizations: no    Any changes in medication regimen? no    Recent/future dental: no  Any missed doses?: no       Is patient compliant with meds? yes       Allergies: No Known Drug Allergies  Anticoagulation Management History:      The patient is taking warfarin and comes in today for a routine follow up visit.  Positive risk factors for bleeding include an age of 65 years or older and presence of serious comorbidities.  The bleeding index is 'intermediate risk'.  Positive CHADS2 values include History of HTN and History of Diabetes.  Negative CHADS2 values include Age > 70 years old.  The start date was 11/11/2003.  Anticoagulation responsible provider: Tenny Craw MD, Gunnar Fusi.  INR POC: 1.6.  Cuvette Lot#: [id number].  Exp: 03/2011.    Anticoagulation Management Assessment/Plan:      The patient's current anticoagulation dose is Jantoven 5 mg tabs: Take as directecd.  The target INR is 2 - 3.  The next INR is due 04/25/2010.  Anticoagulation instructions were given to patient.  Results were reviewed/authorized by Bethena Midget, RN, BSN.  He was notified by Bethena Midget, RN, BSN.         Prior Anticoagulation Instructions: INR 1.1  Coumadin 5mg  tabs  Take 2 tabs on WED and THUR  then 1.5 tab on MON and FRI 1 tab on all other days and continue Lovenox for 5 more days  Current Anticoagulation Instructions: INR 1.6 Today take 2 pills and tomorrow take 2 pills then resume 1 pill everyday except 1.5 pills on Mondays and Fridays. Recheck  in one week.

## 2010-04-28 NOTE — Assessment & Plan Note (Signed)
Summary: ROV   Visit Type:  Follow-up Primary Provider:  [name] FNP   History of Present Illness: Mr Funke is getting along ok.  He has no specific complaints.  Tolerating his medications without difficulty. He has T2 adenocarcinoma of the prostate with Gleasons score of 4 plus 5, and is undergoing curative radiation therapy for prostate Cancer.  Denies any chest pain at present.  No shortness of breath per se.   Currently receiving bridging lovenox for boosting seed implant scheduled for March 9.  Problems Prior to Update: 1)  Hemorrhoids, External  (ICD-455.3) 2)  Hematochezia, Hx of  (ICD-V12.79) 3)  Pure Hypercholesterolemia  (ICD-272.0) 4)  Aortic Valve Replacement, Hx of  (ICD-V43.3) 5)  Adenocarcinoma, Prostate  (ICD-185) 6)  Dm  (ICD-250.00) 7)  Tobacco User  (ICD-305.1) 8)  Hypertension  (ICD-401.9) 9)  Preventive Health Care  (ICD-V70.0) 10)  Hearing Loss, Sensorineural, Bilateral  (ICD-389.10) 11)  Preventive Health Care  (ICD-V70.0) 12)  Coronary Atherosclerosis Native Coronary Artery  (ICD-414.01) 13)  Encounter For Long-term Use of Other Medications  (ICD-V58.69) 14)  Erectile Dysfunction, Organic  (ICD-607.84) 15)  Hypertrophic Obstructive Cardiomyopathy  (ICD-425.1) 16)  Coumadin Therapy  (ICD-V58.61) 17)  Obesity, Moderate  (ICD-278.00) 18)  Hypercholesterolemia  (ICD-272.0) 19)  Aortic Stenosis  (ICD-424.1) 20)  Cad, Artery Bypass Graft  (ICD-414.04) 21)  Gout, Hx of  (ICD-V12.2)  Current Medications (verified): 1)  Toprol Xl 25 Mg Xr24h-Tab (Metoprolol Succinate) .... Take 1 Tablet By Mouth Once A Day 2)  Benicar Hct 20-12.5 Mg Tabs (Olmesartan Medoxomil-Hctz) .... Take 1 Tablet By Mouth Once A Day 3)  Niaspan 500 Mg Cr-Tabs (Niacin (Antihyperlipidemic)) .... Take 1 Tablet By Mouth Once A Day At Bedtime 4)  Crestor 10 Mg Tabs (Rosuvastatin Calcium) .... Take One Tablet By Mouth Daily. 5)  Jantoven 5 Mg Tabs (Warfarin Sodium) .... Take As  Directecd 6)  Colcrys 0.6 Mg Tabs (Colchicine) .... 2 Tabs By Mouth X 1 At Start of Gout Flare, May Take One Tablet 1 Hour Later As Needed 7)  Metformin Hcl 500 Mg Tabs (Metformin Hcl) .... One Tablet By Mouth Twice Daily 8)  Freestyle Lite Test  Strp (Glucose Blood) .... Test Blood Sugar Once A Day. 9)  Freestyle Lancets  Misc (Lancets) .... Check Blood Sugar Once A Day. 10)  Enoxaparin Sodium 150 Mg/ml Soln (Enoxaparin Sodium) .... Inject 1 Syringe Subcutaneously Once Daily As Directed 11)  Analpram-Hc 1-2.5 % Crea (Hydrocortisone Ace-Pramoxine) .... Apply To Hemorrhoids Twice Daily  For 2 Weeks 12)  Januvia 100 Mg Tabs (Sitagliptin Phosphate) .... One Tablet By Mouth Daily  Allergies (verified): No Known Drug Allergies  Vital Signs:  Patient profile:   65 year old male Height:      70 inches Weight:      233.50 pounds BMI:     33.62 Pulse rate:   69 / minute Pulse rhythm:   regular Resp:     18 per minute BP sitting:   130 / 74  (left arm) Cuff size:   large  Vitals Entered By: Vikki Ports (March 28, 2010 4:55 PM)  Physical Exam  General:  Well developed, well nourished, in no acute distress. Head:  normocephalic and atraumatic Eyes:  PERRLA/EOM intact; conjunctiva and lids normal. Lungs:  Clear bilaterally to auscultation and percussion. Heart:  PMI non displaced. Normal S1 and excellent valve sounds.  No significant murmur at this point.  No diastolic murmur. Abdomen:  Bowel sounds positive; abdomen  soft and non-tender without masses, organomegaly, or hernias noted. No hepatosplenomegaly. Pulses:  pulses normal in all 4 extremities Extremities:  No clubbing or cyanosis. Neurologic:  Alert and oriented x 3.   EKG  Procedure date:  03/28/2010  Findings:      NSR.  WNL.   Impression & Recommendations:  Problem # 1:  CORONARY ATHEROSCLEROSIS NATIVE CORONARY ARTERY (ICD-414.01) no  new symptoms.  Getting Lovenox mainly for crossover for seed implant for prostate  cancer being undertaken soon. His updated medication list for this problem includes:    Toprol Xl 25 Mg Xr24h-tab (Metoprolol succinate) .Marland Kitchen... Take 1 tablet by mouth once a day    Jantoven 5 Mg Tabs (Warfarin sodium) .Marland Kitchen... Take as directecd    Enoxaparin Sodium 150 Mg/ml Soln (Enoxaparin sodium) ..... Inject 1 syringe subcutaneously once daily as directed  Orders: EKG w/ Interpretation (93000)  Problem # 2:  AORTIC VALVE REPLACEMENT, HX OF (ICD-V43.3) crisp VS.  On lovenox bridge during seed implants.  Orders: EKG w/ Interpretation (93000)  Problem # 3:  PURE HYPERCHOLESTEROLEMIA (ICD-272.0) on current treatment.   The following medications were removed from the medication list:    Niaspan 500 Mg Cr-tabs (Niacin (antihyperlipidemic)) .Marland Kitchen... Take 1 tablet by mouth once a day at bedtime His updated medication list for this problem includes:    Atorvastatin Calcium 20 Mg Tabs (Atorvastatin calcium) .Marland Kitchen... Take one by mouth at bedtime  Problem # 4:  ADENOCARCINOMA, PROSTATE (ICD-185) See scheduled plans.  Has surgery planned on March 9.  Patient Instructions: 1)  Your physician has recommended you make the following change in your medication: STOP Niaspan and Crestor, START Atorvastatin 20mg  one by mouth at bedtime  2)  Your physician recommends that you return for a FASTING LIPID and LIVER Profile in 6 WEEKS --Nothing to eat or drink after midnight  3)  Your physician wants you to follow-up in:  6 MONTHS.  You will receive a reminder letter in the mail two months in advance. If you don't receive a letter, please call our office to schedule the follow-up appointment. Prescriptions: ATORVASTATIN CALCIUM 20 MG TABS (ATORVASTATIN CALCIUM) take one by mouth at bedtime  #30 x 11   Entered by:   Julieta Gutting, RN, BSN   Authorized by:   Ronaldo Miyamoto, MD, [facility]   Signed by:   Julieta Gutting, RN, BSN on 03/28/2010   Method used:   Electronically to        [facility]* (retail)       [address]       [place], [place]  [address]       Ph: [phone]       Fax: [phone]   RxID:   [id number]

## 2010-05-04 ENCOUNTER — Ambulatory Visit (INDEPENDENT_AMBULATORY_CARE_PROVIDER_SITE_OTHER): Payer: Medicare Other | Admitting: *Deleted

## 2010-05-04 DIAGNOSIS — Z954 Presence of other heart-valve replacement: Secondary | ICD-10-CM

## 2010-05-04 DIAGNOSIS — I359 Nonrheumatic aortic valve disorder, unspecified: Secondary | ICD-10-CM

## 2010-05-04 DIAGNOSIS — Z7901 Long term (current) use of anticoagulants: Secondary | ICD-10-CM

## 2010-05-04 LAB — POCT INR: INR: 2.3

## 2010-05-04 NOTE — Patient Instructions (Signed)
INR 2.3  Change dose to 1 tablet every day except 1 1/2 tablets on Monday and Friday. Recheck INR in 3 weeks.

## 2010-05-09 ENCOUNTER — Other Ambulatory Visit: Payer: Federal, State, Local not specified - PPO | Admitting: *Deleted

## 2010-05-10 ENCOUNTER — Encounter: Payer: Self-pay | Admitting: Family

## 2010-05-17 ENCOUNTER — Other Ambulatory Visit (INDEPENDENT_AMBULATORY_CARE_PROVIDER_SITE_OTHER): Payer: Federal, State, Local not specified - PPO | Admitting: *Deleted

## 2010-05-17 DIAGNOSIS — I10 Essential (primary) hypertension: Secondary | ICD-10-CM

## 2010-05-17 DIAGNOSIS — E7800 Pure hypercholesterolemia, unspecified: Secondary | ICD-10-CM

## 2010-05-17 DIAGNOSIS — I2510 Atherosclerotic heart disease of native coronary artery without angina pectoris: Secondary | ICD-10-CM

## 2010-05-17 LAB — HEPATIC FUNCTION PANEL
ALT: 44 U/L (ref 0–53)
AST: 40 U/L — ABNORMAL HIGH (ref 0–37)
Alkaline Phosphatase: 62 U/L (ref 39–117)
Bilirubin, Direct: 0.1 mg/dL (ref 0.0–0.3)
Total Protein: 7.5 g/dL (ref 6.0–8.3)

## 2010-05-23 ENCOUNTER — Telehealth: Payer: Self-pay | Admitting: Family

## 2010-05-23 MED ORDER — METFORMIN HCL 500 MG PO TABS: 500.0000 mg | ORAL_TABLET | Freq: Two times a day (BID) | ORAL | Status: DC

## 2010-05-23 NOTE — Telephone Encounter (Signed)
Refill- metformin hcl 500mg  tabs. Take one tablet each day for one week then take one tablet twice daily. Qty 60. Last fill 3.13.12

## 2010-05-24 ENCOUNTER — Other Ambulatory Visit: Payer: Self-pay | Admitting: *Deleted

## 2010-05-24 ENCOUNTER — Telehealth: Payer: Self-pay | Admitting: Family

## 2010-05-24 MED ORDER — FREESTYLE LANCETS MISC: Status: DC

## 2010-05-24 MED ORDER — WARFARIN SODIUM 5 MG PO TABS: 5.0000 mg | ORAL_TABLET | ORAL | Status: DC

## 2010-05-24 NOTE — Telephone Encounter (Signed)
Refill-freestyle lancets misc. Use as directed. Original qty ordered 100. Last fill 6.7.11

## 2010-05-24 NOTE — Telephone Encounter (Signed)
Refill sent to pharmacy.   

## 2010-05-25 ENCOUNTER — Encounter: Payer: Medicare Other | Admitting: *Deleted

## 2010-05-25 ENCOUNTER — Telehealth: Payer: Self-pay | Admitting: Family

## 2010-05-25 DIAGNOSIS — E119 Type 2 diabetes mellitus without complications: Secondary | ICD-10-CM

## 2010-05-25 NOTE — Telephone Encounter (Signed)
Pls call patient and make sure that he is scheduled for a 3 month follow up.  He should complete A1C prior to this visit please.

## 2010-05-26 ENCOUNTER — Ambulatory Visit (INDEPENDENT_AMBULATORY_CARE_PROVIDER_SITE_OTHER): Payer: Federal, State, Local not specified - PPO | Admitting: *Deleted

## 2010-05-26 DIAGNOSIS — Z954 Presence of other heart-valve replacement: Secondary | ICD-10-CM

## 2010-05-26 DIAGNOSIS — I359 Nonrheumatic aortic valve disorder, unspecified: Secondary | ICD-10-CM

## 2010-05-26 NOTE — Telephone Encounter (Signed)
Left message with pt's son to have pt return my call. Pt needs f/u with [name] in May.

## 2010-05-26 NOTE — Telephone Encounter (Signed)
Pt notified and will have labwork drawn today. Order printed and given to pt to take to the lab. F/u is being scheduled.

## 2010-05-27 ENCOUNTER — Encounter: Payer: Self-pay | Admitting: Family

## 2010-05-30 ENCOUNTER — Telehealth: Payer: Self-pay | Admitting: *Deleted

## 2010-05-30 NOTE — Telephone Encounter (Signed)
Pt left voice message stating he would like to get the shingles vaccine at his next appt. In may. Advised pt to call his insurance and find out how it is covered and let us know. Pt voices understanding.

## 2010-06-03 ENCOUNTER — Encounter: Payer: Self-pay | Admitting: Family

## 2010-06-13 ENCOUNTER — Encounter: Payer: Self-pay | Admitting: Family

## 2010-06-13 ENCOUNTER — Ambulatory Visit (INDEPENDENT_AMBULATORY_CARE_PROVIDER_SITE_OTHER): Payer: Federal, State, Local not specified - PPO | Admitting: Family

## 2010-06-13 VITALS — BP 100/70 | HR 66 | Temp 98.7°F | Resp 16 | Wt 234.0 lb

## 2010-06-13 DIAGNOSIS — C61 Malignant neoplasm of prostate: Secondary | ICD-10-CM

## 2010-06-13 DIAGNOSIS — E781 Pure hyperglyceridemia: Secondary | ICD-10-CM

## 2010-06-13 DIAGNOSIS — E119 Type 2 diabetes mellitus without complications: Secondary | ICD-10-CM

## 2010-06-13 DIAGNOSIS — Z23 Encounter for immunization: Secondary | ICD-10-CM

## 2010-06-13 HISTORY — DX: Pure hyperglyceridemia: E78.1

## 2010-06-13 NOTE — Assessment & Plan Note (Signed)
Diabetes is well controlled.  Will add Fish oil

## 2010-06-13 NOTE — Assessment & Plan Note (Signed)
This is being managed by Urology.  He appears to be tolerating his treatment well.

## 2010-06-13 NOTE — Assessment & Plan Note (Addendum)
Lab Results  Component Value Date   HGBA1C 6.4* 05/26/2010   Will continue metformin.

## 2010-06-13 NOTE — Progress Notes (Signed)
Subjective:    Patient ID: [name], male    DOB: 1945/03/23, 65 y.o.   MRN: [id number]  HPI  [name] is a 65 yr old male who presents today for follow up.   1) Prostate CA- had radioactive seed implant on 3/9.  He will have a f/u PSA in July.  He is having a hormone shots.  He has had some associated diarrhea- using PRN imodium.  2) DM2-  Fasting sugars -  Generally have been running about 120.  Post prandial has been in the upper 100's.    Review of Systems See HPI  Past Medical History  Diagnosis Date  . Allergy   . Hyperlipidemia   . Hypertension   . Cancer 2011    prostate  . Obesity     moderate  . Aortal stenosis   . CAD (coronary artery disease)     cabg  . History of gout   . Heart murmur   . History of hepatitis 1974    History   Social History  . Marital Status: Married    Spouse Name: N/A    Number of Children: N/A  . Years of Education: N/A   Occupational History  . Not on file.   Social History Main Topics  . Smoking status: Current Some Day Smoker    Types: Cigars  . Smokeless tobacco: Not on file   Comment: 6 cigars a month  . Alcohol Use: Yes     4 glasses wine a month  . Drug Use: Not on file  . Sexually Active: Not on file   Other Topics Concern  . Not on file   Social History Narrative  . No narrative on file    Past Surgical History  Procedure Date  . Prostate seed implant 3/12  . Coronary artery bypass graft 10/2003  . Aortic valve replacement     [facility]  . Hip surgery 2006    right hip  . Cholecystectomy 1990  . Hernia repair 1999    right, inguinal  . Hernia repair 2002    left, inguinal  . Pilonidal cyst excision 1964  . Tonsillectomy childhood  . Appendectomy 1990    Family History  Problem Relation Age of Onset  . Heart disease Mother   . Stroke Mother   . Heart disease Father   . Coronary artery disease Brother   . Asperger's syndrome Son   . Hyperlipidemia Son   . Cancer Neg Hx    negative for colon cancer    No Known Allergies  Current Outpatient Prescriptions on File Prior to Visit  Medication Sig Dispense Refill  . colchicine 0.6 MG tablet Take 2 tablets by mouth at start of gout flare, may take 1 tablet 1 hour later as needed.       Marland Kitchen glucose blood (FREESTYLE TEST STRIPS) test strip Use to check blood sugar once a day.       . Lancets (FREESTYLE) lancets Use to check blood sugar once a day as directed.  100 each  1  . metFORMIN (GLUCOPHAGE) 500 MG tablet Take 1 tablet (500 mg total) by mouth 2 (two) times daily with a meal.  60 tablet  1  . metoprolol succinate (TOPROL XL) 25 MG 24 hr tablet Take 1 tablet (25 mg total) by mouth daily.  30 tablet  2  . sitaGLIPtan (JANUVIA) 100 MG tablet Take 100 mg by mouth daily.        Marland Kitchen  DISCONTD: warfarin (COUMADIN) 5 MG tablet Take 1 tablet (5 mg total) by mouth as directed.  40 tablet  3  . DISCONTD: atorvastatin (LIPITOR) 20 MG tablet Take 20 mg by mouth at bedtime.        Marland Kitchen DISCONTD: benazepril-hydrochlorthiazide (LOTENSIN HCT) 20-12.5 MG per tablet Take 1 tablet by mouth daily.        Marland Kitchen DISCONTD: enoxaparin (LOVENOX) 150 MG/ML injection Inject 1 syringe subcutaneously once a day.       Marland Kitchen DISCONTD: hydrocortisone-pramoxine (ANALPRAM-HC) 2.5-1 % rectal cream Place 1 application rectally 2 (two) times daily. For 2 weeks.         BP 100/70  Pulse 66  Temp(Src) 98.7 F (37.1 C) (Oral)  Resp 16  Wt 234 lb (106.142 kg)       Objective:   Physical Exam  Gen: pleasant white male, hard of hearing. CV:  S1/s2, RRR, loud mechanical valve click Resp: BS CTA bilaterally, no wheezes rales/rhonchi Psych: A and O x 3.       Assessment & Plan:

## 2010-06-13 NOTE — Patient Instructions (Signed)
Please follow up in 3 months. Sooner if problems or concerns.

## 2010-06-14 NOTE — Assessment & Plan Note (Signed)
[facility]                            CARDIOLOGY OFFICE NOTE   EBUBECHUKWU, JEDLICKA                         MRN:          [id number]  DATE:10/03/2006                            DOB:          20-Dec-1945    Mr. Segreto is in for followup.  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  In general, he feels really quite well.  He has no  limitations or shortness of breath.   MEDICATIONS:  1. Coumadin.  2. Toprol XL 25 mg daily.  3. Benicar 20/12.5 daily.  4. Niaspan 500 mg nightly.  5. Crestor 10 mg daily.   His most recent lipid profile in April 2008, revealed normal liver  functions with cholesterol of 127 and LDL at target at 70, but a low HDL  at 34.9 and triglycerides of 263.   PHYSICAL EXAMINATION:  VITAL SIGNS:  Weight 233 pounds, blood  pressure142/82, pulse 80.  CARDIAC:  Valve sounds are crisp and cardiac rhythm is regular.  SKIN:  He actually has sunburn and I have encouraged him to wear a hat.  EXTREMITIES:  No edema.   EKG reveals normal sinus rhythm within normal limits.   IMPRESSION:  1. Status post aortic valve replacement and multivessel coronary      artery bypass graft surgery.  2. Mixed hyperlipidemia on lipid lowering therapy with LDL at target      with elevated triglycerides.  3. Chronic Coumadin anticoagulation.  4. History of gout off Allopurinol.     Arturo Morton. Riley Kill, MD, [facility]  Electronically Signed    TDS/MedQ  DD: 10/03/2006  DT: 10/04/2006  Job #: [id number]

## 2010-06-14 NOTE — Assessment & Plan Note (Signed)
[facility]                            CARDIOLOGY OFFICE NOTE   [name], [name]                           MRN:          [id number]  DATE:05/21/2007                            DOB:          1945-03-29    This patient is in for a follow-up visit.  In general he has been  stable.  He has not been having any chest pain or shortness of breath.  He denies anything, other than a little bit of tiredness.  He smokes  about 6 cigars a month, and he remains on Coumadin without difficulty.   His weight is 228 pounds.  Blood pressure 128/82, pulse 68.  Lung fields  were clear.  Bowel sounds were crisp.  There was no extremity edema.   Electrocardiogram demonstrates normal sinus rhythm and minor nonspecific  ST abnormality.   IMPRESSION:  1. Coronary artery disease and aortic stenosis, status post aortic      valve replacement and coronary artery bypass grafting.  2. Hypercholesterolemia.  3. Moderate obesity.   RECOMMENDATIONS:  1. A 15-minute discussion today about adult dysmetabolic syndrome.  2. Lipid and liver profile.  3. Basic metabolic profile.  4. I encouraged him to follow up with Dr. Drue Novel.     Arturo Morton. Riley Kill, MD, [facility]  Electronically Signed    TDS/MedQ  DD: 05/21/2007  DT: 05/21/2007  Job #: [id number]   cc:   Willow Ora, MD

## 2010-06-15 ENCOUNTER — Ambulatory Visit (INDEPENDENT_AMBULATORY_CARE_PROVIDER_SITE_OTHER): Payer: Medicare Other | Admitting: *Deleted

## 2010-06-15 DIAGNOSIS — I359 Nonrheumatic aortic valve disorder, unspecified: Secondary | ICD-10-CM

## 2010-06-15 DIAGNOSIS — Z954 Presence of other heart-valve replacement: Secondary | ICD-10-CM

## 2010-06-15 LAB — POCT INR: INR: 1.7

## 2010-06-16 ENCOUNTER — Telehealth: Payer: Self-pay | Admitting: *Deleted

## 2010-06-16 ENCOUNTER — Encounter: Payer: Medicare Other | Admitting: *Deleted

## 2010-06-16 NOTE — Telephone Encounter (Signed)
Message copied by Mervin Kung on Thu Jun 16, 2010  4:21 PM ------      Message from: O'SULLIVAN, [name]      Created: Mon Jun 13, 2010 12:54 PM       Could you pls call [facility] and request last office note?

## 2010-06-16 NOTE — Telephone Encounter (Signed)
Spoke to Medical Records at [phone] and requested most recent office note. They will fax note.

## 2010-06-17 NOTE — Op Note (Signed)
NAMEJANSEL, VONSTEIN                  ACCOUNT NO.:  [id number]   MEDICAL RECORD NO.:  [id number]          PATIENT TYPE:  AMB   LOCATION:  DAY                          FACILITY:  [facility]   PHYSICIAN:  Charlynne Pander, D.D.S.DATE OF BIRTH:  1945-12-26   DATE OF PROCEDURE:  11/05/2003  DATE OF DISCHARGE:                                 OPERATIVE REPORT   PREOPERATIVE DIAGNOSES:  1.  Coronary artery disease.  2.  Critical aortic stenosis.  3.  Pre-heart valve surgery dental protocol.  4.  Chronic apical periodontitis.  5.  Chronic periodontitis.  6.  Accretions.   POSTOPERATIVE DIAGNOSES:  1.  Coronary artery disease.  2.  Critical aortic stenosis.  3.  Pre-heart valve surgery dental protocol.  4.  Chronic apical periodontitis.  5.  Chronic periodontitis.  6.  Accretions.   OPERATIONS:  1.  Dental examination.  2.  Extraction of tooth #16 and 18 with alveoloplasty.  3.  Gross debridement of the remaining dentition.   SURGEON:  Charlynne Pander, D.D.S.   ASSISTANT:  Elliot Dally (Sales executive).   ANESTHESIA:  1.  Monitored anesthesia care per the anesthesia team.  2.  Local anesthesia with a total utilization of two carpules each      containing 36 mg of Xylocaine with 0.018 mg of epinephrine; two carpules      containing 54 mg of mepivacaine with no epinephrine; and one carpule      containing 9 mg of Marcaine with 0.009 mg of epinephrine.   MEDICATIONS:  Ampicillin 2.0 g IV prior to invasive dental procedures.   SPECIMENS:  There were two teeth, which were discarded.   DRAINS/CULTURES:  None.   COMPLICATIONS:  None.   FLUIDS REPLACED:  600 mL of lactated Ringer's solution.   ESTIMATED BLOOD LOSS:  Less than 50 mL.   INDICATIONS:  The patient was recently diagnosed with coronary artery  disease and critical aortic stenosis.  The patient with anticipated heart  valve surgery with Dr. Tyrone Sage in the future.  The patient was examined and  treatment planned for  extraction of tooth #16 and 18 with alveoloplasty,  along with gross debridement of the remaining dentition. This treatment plan  is formulated to decrease the risks and complications associated with dental  infection from affecting the patient's systemic health and anticipated heart  surgery.   OPERATIVE FINDINGS:  The patient was examined in operating room #6.  The  teeth were identified for extraction.  The patient was noted to be affected  by chronic apical periodontitis, chronic periodontitis, and accretions.   DESCRIPTION OF PROCEDURE:  The patient was brought to the main operating  room #6.  The patient was placed in the supine position on the operating  room table.  Monitored anesthesia care was induced per the anesthesia team.  The patient was then prepped and draped in the usual manner for a dental  medicine procedure.  The oral cavity was thoroughly examined with the  findings as noted above.  The patient was then ready for the dental medicine  procedure as  follows:   Local anesthesia was administered sequentially throughout the one hour-long  procedure.   The maxillary left quadrant was first approached.  Anesthesia was delivered  as previously described.  Tooth #16 was subluxated with a series of straight  elevators and then removed with a 53L forceps without complications.  A  minor alveoloplasty was performed utilizing a rongeur and bone file.  The  surgical site was then irrigated with copious amounts of sterile saline.  The surgical site was then closed utilizing 3-0 chromic gut suture in a  figure-of-eight suture technique x1.   At this point in time the mandibular left quadrant was approach.  Anesthesia  was delivered as previously described.  A series of straight elevators was  utilized to subluxate tooth #18.  This tooth was then removed without  complications with a 23 forceps.  The surgical site was then irrigated with  copious amounts of sterile saline.  Minor  alveoloplasty was then performed  utilizing a rongeur and bone file.  The surgical site was then again  irrigated with copious amounts of sterile saline.  The surgical site was  then closed utilizing 3-0 chromic suture material in a figure-of-eight  suture technique x1.   The remaining dentition was then approached.  A Cavitron was then utilized  to remove significant accretions associated with the remaining dentition.  A  series of hand curettes were then further utilized to remove the accretions.  The Cavitron was then again utilized to remove further accretions.  At this  point in time the entire mouth was irrigated with copious amounts of sterile  saline.  The patient was examined for complications.  Seeing none, the  dental medicine procedure was deemed to be complete.  A 4 x 4 gauze was  placed in the extraction sites to aid hemostasis.  The patient was then  handed over to the anesthesia team for final disposition.  After appropriate  amount of time, the patient was taken to the postanesthesia care unit with  stable vital signs and good oxygenation level.  All  counts were correct for the dental medicine procedure.  The patient will be  given appropriate pain medication and will be followed for evaluation for  suture removal in approximately one week.  The patient is currently cleared  for heart surgery as indicated with Dr. Tyrone Sage in the future.      RFK/MEDQ  D:  11/05/2003  T:  11/05/2003  Job:  [id number]   cc:   Arturo Morton. Riley Kill, M.D. [facility]   Sheliah Plane, MD  [address]  [place]  [place] [address]

## 2010-06-17 NOTE — Op Note (Signed)
NAMELEDARRIUS, BEAUCHAINE                  ACCOUNT NO.:  [id number]   MEDICAL RECORD NO.:  [id number]          PATIENT TYPE:  OIB   LOCATION:  2899                         FACILITY:  [facility]   PHYSICIAN:  Harvie Junior, M.D.   DATE OF BIRTH:  [birth year]   DATE OF PROCEDURE:  06/01/2004  DATE OF DISCHARGE:                                 OPERATIVE REPORT   PREOPERATIVE DIAGNOSIS:  Anterior superior labral tear.   POSTOPERATIVE DIAGNOSES:  Anterior superior labral tear.   PRINCIPAL PROCEDURE:  Right hip arthroscopy, with debridement of anterior  superior labral tear.   SURGEON:  Harvie Junior, M.D.   ASSISTANT:  Marshia Ly, P.A.   ANESTHESIA:  General.   BRIEF HISTORY:  He is a 65 year old male with a long history of having  intermittent locking and catching in the right hip.  He was ultimately  evaluated and felt to have labral tearing of the hip clinically.  He was  sent for MRI, which confirmed the diagnosis.  He was followed clinically.  Had an injection of cortisone which had helped some but not dramatically.  We talked about treatment options.  It was ultimately felt that debridement  of labral tear was the most appropriate course of action.  He was brought to  the operating room for this procedure.  The patient was on anticoagulants  preoperatively and had to come off his Coumadin and was started on Lovenox,  and this was accomplished, and he was held off of his Lovenox on the morning  of procedure.   PROCEDURE:  The patient was taken to the operating room.  After adequate  anesthesia was obtained  with general anesthesia, the patient was placed in  the left lateral decubitus position.  All bony prominences were well padded.  Attention was then placed into the traction position, which abducted the hip  and allowed for traction to be placed.  About 50 pounds of traction was  placed, and the fluoro was used to make sure the hip was distracting.  Following this, the hip was  prepped and draped in the usual sterile fashion.  An air arthrogram was performed, and additional traction was obtained with  fluid into the hip.  The camera was then put into the hip easily, and an  anterior portal was established, and the anterior superior labral tear was  easily identifiable.  This was debrided with the combination of a suction  shaver as well as an angled blader.  Once this was finished, the anterior  superior area was probed, and the remaining labrum was stable.  At this  point, the remainder of the examination of the hip showed that the fovea was  within normal limits.  There were no loose bodies.  There was no additional  superior labral tearing, although there was a little bit of superior labral  fray.  At this point, the wound was copiously irrigated and suctioned dry.  8 cc of  Marcaine with 2 cc of 80 mg/cc of Depo-Medrol was instilled into the hip for  postoperative anesthesia and pain control.  The case was done under  fluoroscopic guidance as well as arthroscopic guidance.  Marshia Ly  assisted throughout the case.      JLG/MEDQ  D:  06/01/2004  T:  06/01/2004  Job:  [id number]   cc:   Harvie Junior, M.D.  [address]  [place]  [place] [address]  Fax: [phone]

## 2010-06-17 NOTE — H&P (Signed)
[name], [name]                  ACCOUNT NO.:  [id number]   MEDICAL RECORD NO.:  [id number]          PATIENT TYPE:  INP   LOCATION:  1823                         FACILITY:  [facility]   PHYSICIAN:  Learta Codding, M.D. LHCDATE OF BIRTH:  [birth year]   DATE OF ADMISSION:  11/27/2003  DATE OF DISCHARGE:                                HISTORY & PHYSICAL   CARDIOLOGIST:  Arturo Morton. Riley Kill, M.D.   REASON FOR ADMISSION:  Syncope.   HISTORY OF PRESENT ILLNESS:  The patient is a 65 year old male with a  history of bicuspid valve and severe septal hypertrophy, status post recent  myomectomy and aortic valve replacement with the St. Jude Regent.  The  patient also underwent coronary artery bypass grafting x4 performed by Dr.  Tyrone Sage.  The patient was recently discharged on November 17, 2003.  Since  his discharge, the patient has started complaining of dizziness and weakness  when standing during micturition.  Today, while urinating, the patient  suddenly collapsed and lost consciousness.  The patient was out for a brief  period and was lying on the floor.  Regained his consciousness. There was no  antedating chest pain or shortness of breath.  His only complaint of minor  incisional chest pain and reports no dyspnea or chest pain on exertion.   On admission, his electrocardiogram demonstrates normal sinus rhythm, left  atrial enlargement, left ventricular hypertrophy with secondary  repolarization changes but no evidence of significant heart block.  A  bedside echocardiographic study was performed to make sure that the patient  did not have acute valve thrombosis or a hemodynamically significant  pericardial effusion.  The bedside echo was essentially unremarkable showing  that the patient had a very dynamic left ventricular contraction with near  cavity obliteration.   ALLERGIES:  No known drug allergies.   MEDICATIONS:  1.  Coumadin.  2.  Lisinopril 5 mg p.o. b.i.d.  3.  Lovastatin 20  mg p.o. at night.  4.  Metoprolol 12.5 mg b.i.d.  5.  Allopurinol 300 mg a day.  6.  Just give IV fluids 100 mL/hour x12 hours or something like that.  7.  Folic acid 1 mg p.o. daily.  8.  Tylox p.r.n.   PAST MEDICAL HISTORY:  1.  Cardiac catheterization October 14, 2003; 90% LAD, 90% circumflex      coronary artery.  Occluded right coronary artery.  Severe aortic      stenosis with a gradient of 72 mmHg.  Echo on September 10, 2003 showed      ejection fraction 65% with septal hypertrophy and severe aortic stenosis      secondary to bicuspid aortic valve.  2.  Coronary artery bypass grafting x4 performed by Dr. Tyrone Sage.  LIMA to      the LAD, saphenous vein graft to the OM, saphenous vein graft to the RCA      and circumflex.  3.  Status post aortic valve replacement, #23 St. Jude Regent mechanical      prosthesis including septal myomectomy.  4.  History of hypertension.  5.  History of dyslipidemia.  6.  Status post cholecystectomy and hernia surgery.   SOCIAL HISTORY:  The patient moved from [place] in mid September.  He [place]  to travel [place].  He is married with two children.  He is an Insurance claims handler.  He quit tobacco 30 years ago.  Occasionally drinks some alcohol.   FAMILY HISTORY:  Notable for father died in his 77s from myocardial  infarction.  Mother in her 2s also from myocardial infarction, had TIAs.  A  brother is 31 years old, also has known coronary artery disease.   REVIEW OF SYSTEMS:  Largely noncontributory as the patient reports no  shortness of breath, orthopnea, PND.  There is mild incisional chest pain.  No weakness or numbness apart from what occurs during micturition as  outlined above.  No nausea or vomiting.   PHYSICAL EXAMINATION:   REVIEW OF SYSTEMS:  No fever or chills.  No headaches or sore throat.  No  chest pain or shortness of breath, dyspnea on exertion recently.  No  dysuria.  No weakness or numbness.  No arthralgias or myalgias.  No  nausea  or vomiting.   PHYSICAL EXAMINATION:  VITAL SIGNS:  Blood pressure 197/69,  heart rate 90  beats per minute, respirations 18, unlabored.  GENERAL:  Well-nourished white male in no apparent distress.  NECK: .  Normal carotid upstroke.  No carotid bruits.  LUNGS:  Clear breath sounds bilaterally.  HEART:  Regular rate and rhythm. Normal S1 and a normal closing click of S2  secondary to the mechanical prosthesis.  No pathological murmurs.  There is  a soft ejection murmur.  There are no diastolic murmurs.  ABDOMEN:  Soft, nontender.  No rebound or guarding.  Good bowel sounds.  EXTREMITIES:  There is no edema and bilateral well-healed saphenous vein  graft harvest sites.   LABORATORY DATA:  A 12-lead EKG showed normal sinus rhythm, LVH and  secondary repolarization changes as outlined above.  Chest x-ray:  No  cardiomegaly.  Well placed sternal wires.  No infiltrates.  INR is 2.4 on  admission.  Other labs are currently pending.   ASSESSMENT:  1.  Syncope, status post micturition.  2.  Relative hypotension.  3.  Coronary artery disease, status post coronary artery bypass grafting x4.  4.  History of bicuspid aortic valve with aortic valve replacement, #23 St.      Jude Regent prosthesis.  5.  History of asymmetrical septal hypertrophy, status post myomectomy      during previous surgery.  6.  Coumadin  anticoagulation, slightly subtherapeutic.  7.  History of gout secondary to hydrochlorothiazide.  8.  Remote tobacco use.  9.  Normal left ventricular systolic function.   PLAN:  A bedside echo confirmed the absence of catastrophic complications  related to his recent surgery, i.e., valve thrombosis (although a formal  echocardiogram will be obtained ), pericardial effusion or significant  postoperative LV dysfunction.  It also does not appear that the patient is  bleeding and there appears to be no evidence of conduction system disease related to the recent myomectomy.  By  history, it does appear indeed that  the patient has micturition syncope, which is likely exacerbated by a  relative hypovolemia and relative hypotension as well as decreased vascular  tone( 2nd bedrest and VD therapy) after his recent hospitalization.  I have  recommended to the patient that for the next couple of weeks, he make sure  that he sits  down when going to the bathroom.  Will also rehydrate him with  some IV fluids.  Will hold off any type of diuretics for the future and will  stop his lisinopril which might be contributing to the loss of vascular  tone.  I will obtain cardiac enzymes overnight and a repeat hemoglobin in  the morning.  If all is stable, the patient can be discharged in the morning  with an elective followup to the urologist to make sure that he does not  have any significant problems as it relates to his prostate; i.e., BPH, etc.      Michelle Piper   GED/MEDQ  D:  11/27/2003  T:  11/27/2003  Job:  [id number]   cc:   Arturo Morton. Riley Kill, M.D. [facility]

## 2010-06-17 NOTE — Op Note (Signed)
[name], [name]                  ACCOUNT NO.:  [id number]   MEDICAL RECORD NO.:  [id number]          PATIENT TYPE:  INP   LOCATION:  2306                         FACILITY:  [facility]   PHYSICIAN:  Sheliah Plane, MD    DATE OF BIRTH:  1945-10-28   DATE OF PROCEDURE:  11/12/2003  DATE OF DISCHARGE:                                 OPERATIVE REPORT   PREOPERATIVE DIAGNOSES:  1.  Coronary occlusive disease with angina.  2.  Bicuspid valve with aortic stenosis.  3.  Question of septal hypertrophy.   PROCEDURE PERFORMED:  1.  Coronary artery bypass grafting x4 with left internal mammary to the      left anterior descending coronary artery, reversed saphenous vein graft      to the first obtuse marginal coronary artery, sequential reversed      saphenous vein graft to the acute marginal and distal circumflex.  2.  Aortic valve replacement with a #23 Regent aortic mechanical valve.  3.  Septal myomectomy.  4.  Endovein harvesting.   SURGEON:  Sheliah Plane, MD   FIRST ASSISTANT:  Rowe Clack, P.A.-C.   BRIEF HISTORY:  The patient is a 65 year old male who presents primarily  with anginal symptoms and mild congestive heart failure.  He was evaluated  extensively in [place], including cardiac catheterization, TEE, and echo.  This evaluation led to the firm diagnosis of coronary occlusive disease with  80% stenosis of the LAD, total occlusion of the right coronary artery, and  80% stenosis of the circumflex involving the moderate-sized first obtuse  marginal and a smaller distal circumflex supplying the PD and PL.  In  addition, there was a question of, the patient also had a calculated valve  area of 0.7 at the time of cardiac catheterization.  This diagnosis was  confused by echocardiographic findings of a bicuspid aortic valve, valve  area between 1.1 and 1.4 depending on technique, and question of prominent  septal hypertrophy.  A follow-up echocardiogram was performed at [facility].  Because of the patient's symptoms with at least moderate aortic  stenosis and significant three-vessel coronary artery disease, coronary  artery bypass grafting, aortic valve replacement, question of myomectomy was  recommended to the patient.  After discussion of valve replacement types, he  preferred a mechanical valve and was willing to take long-term Coumadin  without any obvious contradicting reasons to be on Coumadin.   DESCRIPTION OF PROCEDURE:  The patient underwent general endotracheal  anesthesia without incident.  The skin of the chest and legs was prepped  with Betadine and draped in the usual sterile manner.  A transesophageal  echo probe was placed and a careful evaluation revealed trace to 1+ mitral  regurgitation but with intact structure of the mitral leaflets and chordal  structures.  He did have evidence of septal hypertrophy, but it appeared  that the primary gradient was across the aortic valve, which was bicuspid  with the noncoronary cusp being severely calcified.  Initially vein was  harvested from the right thigh endoscopically; however, this vein was very  small  and of poor quality.  We shifted and using an open technique, a  segment of vein was harvested from each lower leg and was of excellent  quality and caliber.  Median sternotomy was performed, the left internal  mammary artery was dissected down as a pedicle graft.  The distal artery was  divided, had good free flow.  Pericardium was opened.  The patient was  systemically heparinized.  The ascending aorta was cannulated.  Superior and  inferior vena caval cannulas were placed.  The patient was then placed on  cardiopulmonary bypass, 2.4 L/min. per sq. m.  Sites of anastomosis were  selected and dissected out of the epicardium.  The distal circumflex was a  small vessel supplying around to the inferior surface of the heart.  The LAD  was a large vessel wrapping around the apex of the heart and  supplying a  significant portion of the inferior surface.  The obtuse marginal coronary  artery was larger than anticipated.  The patient's body temperature was  cooled to 30 degrees.  The aortic crossclamp was applied, 500 mL of cold  blood potassium cardioplegia was administered antegrade.  Attention was  turned first to the coronary anastomoses.  The obtuse marginal was opened  and admitted a 1.5 mm probe easily.  Using a running 7-0 Prolene, distal  anastomosis was performed with a segment of reversed saphenous vein graft.  Attention was then turned to the acute marginal coronary artery, which arose  from a small nondominant right vessel.  The vessel was opened and a side-to-  side diamond-type anastomosis was carried out with a segment of reversed  saphenous vein graft.  Distal extent of the same vein was then carried to  the inferior surface of the heart, where the distal circumflex was opened  and admitted a 1 mm probe.  Using a 7-0 Prolene, a distal anastomosis was  performed.  Attention was then turned to the left anterior descending  coronary artery, and the distal third of the vessel was opened and admitted  a 1.5 mm probe.  Using a running 8-0 Prolene, the left internal mammary  artery was anastomosed to the left anterior descending coronary artery.  The  fascia of the pedicle was tacked to the epicardium.  Attention was then  turned to the aortic valve.  A transverse aortotomy was performed.  This  gave good visualization of functionally a bicuspid valve with fusion of the  left and right coronary cusps and a significant amount of calcification  involving the noncoronary cusp.  The valve was excised  Using a small hook  retractor, the proximal portion of the septum under the right and left  coronary cusp was identified and appeared to be somewhat prominent.  Two  longitudinal cuts in this muscle were made and the intervening area of muscle was excised.  Fragments of this were  submitted to the pathologist for  examination.  The annulus itself was then sized for a 23 Regent mechanical  aortic valve.  Tycron #2 pledgeted sutures with the pledgets on the  ventricular surface were placed circumferentially around the annulus.  The  valve was then secured in place and had free movement of the leaflets.  The  left and the right coronary cusp were unimpeded.  The aorta was then closed  with a horizontal mattress 3-0 Prolene suture over felt strips.  With the  crossclamp still in place, two punch aortotomies were performed and each of  the two vein  grafts were anastomosed to the ascending aorta.  Prior to  complete closure of the last anastomosis, the heart was allowed to passively  fill and de-air.  The bulldog on the mammary artery was removed, and there  was prompt rise in myocardial septal temperature.  Aortic crossclamp was  removed and the final proximal anastomosis was completed after de-airing the  heart with a crossclamp time of 151 minutes.  The patient spontaneously  converted to a sinus rhythm as he rewarmed.  The right 16-gauge needle was  introduced in the left ventricular apex to further de-air the heart.  Air  was evacuated from the grafts and bulldogs on the grafts were removed.  With  the patient's body temperature rewarmed and in sinus rhythm, function of the  valves was examined as the heart was allowed to fill.  The patient continued  to have trace to 1+ mitral regurgitation.  It appeared somewhat decreased  and did not appear to warrant further intervention on the mitral valve.  The  right superior pulmonary vein vent was removed.  With the patient rewarmed  to 37 degrees, he was then ventilated and weaned from cardiopulmonary bypass  without difficulty.  He remained hemodynamically stable.  He was  decannulated in the usual fashion.  Protamine sulfate was administered.  With the operative field hemostatic, two atrial and two ventricular pacing   wires were applied, graft markers were applied.  A left pleural tube, two  mediastinal tubes left in place.  The sternum was closed with #6 stainless  steel wire, fascia closed with interrupted 0 Vicryl, running 3-0 Vicryl in  the subcutaneous tissue, 4-0 subcuticular stitch in the skin  edges.  Dry dressings were applied.  Sponge and needle count was reported as  correct at completion of the procedure.  Total pump time was 220 minutes.  The serial number of the aortic valve was [id number], model 23AGN-751 St. Jude  mechanical Regent aortic valve prosthesis.      [place]   EG/MEDQ  D:  11/12/2003  T:  11/12/2003  Job:  [id number]   cc:   Arturo Morton. Riley Kill, M.D. [facility]

## 2010-06-17 NOTE — Assessment & Plan Note (Signed)
[facility]                              CARDIOLOGY OFFICE NOTE   [name], [name]                         MRN:          [id number]  DATE:09/05/2005                            DOB:          [birth year]    [name] is in for a followup visit.  To briefly summarize, he is stable.  He has not been having any chest pain.  He does have problems with  developing and maintaining an erection and he wondered if he would be okay  to use some Viagra or similar agents.  He denies any ongoing chest pain.   PHYSICAL EXAMINATION:  VITAL SIGNS:  On examination today, the blood  pressure is 130/82, the pulse is 66.  LUNGS:  Lung fields are clear.  CARDIOVASCULAR:  The cardiac rhythm is regular.  Valve sound is crisp.  There is no diastolic murmur appreciated.   EKG reveals normal sinus rhythm, essentially within normal limits overall.   Overall, the patient is doing well.  We plan to see him back in followup in  about six months.  I have given him a prescription for Viagra and instructed  him in its use.  I have told him not to use nitrates.  We have chosen to use  a short-acting agent.                              Arturo Morton. Riley Kill, MD, [facility]    TDS/MedQ  DD:  09/05/2005  DT:  09/05/2005  Job #:  [id number]

## 2010-06-17 NOTE — Discharge Summary (Signed)
**Note David via Obfuscation** [name], Montgomery NO.:  [id number]   MEDICAL RECORD NO.:  [id number]          PATIENT TYPE:  INP   LOCATION:  2038                         FACILITY:  [facility]   PHYSICIAN:  Sheliah Plane, MD    DATE OF BIRTH:  August 06, 1945   DATE OF ADMISSION:  11/11/2003  DATE OF DISCHARGE:  11/18/2003                                 DISCHARGE SUMMARY   ADMISSION DIAGNOSES:  1.  Severe coronary artery disease.  2.  Aortic valve stenosis.   BRIEF HISTORY:  [name] is a 65 year old Caucasian male.  Prior to  relocating to [place], [place], he was evaluated by [facility] in [place], [place].  He was evaluated for  symptoms of chest tightness, exercise intolerance, and increasing shortness  of breath, and discomfort.   He had an echocardiogram September 10, 2003 which is interpreted as normal LV  size with concentric hypertrophy and ejection fraction estimated at 65%.  Maximum gradient across the valve was 72 mm with a mean of 40.  He also  underwent cardiac catheterization on October 14, 2003.   This revealed an aortic valve gradient of 55 mm with a valve area of 0.7.  His LAD was noted to have a 90% lesion, circumflex also with a 90%  obstruction.  His right coronary artery was totally occluded.  He was  referred to Dr. Sheliah Plane for consideration of surgical intervention.  He was evaluated in the CVTS office on October 28, 2003.  After  examination of the patient's available records, Dr. Sheliah Plane  recommended proceeding with aortic valve replacement and coronary artery  bypass grafting.  The procedure, risks, and benefits were all discussed with  Mr.  [name] and he agreed to proceed with surgery.   A discussion included a choice of prosthesis for his valve with [name]  understanding that mechanical valve replacement would require long-long  Coumadin therapy.  [name] agreed to proceed with surgery.  Prior  to  proceeding, he underwent dental evaluation and treatment.   HOSPITAL COURSE:  On November 11, 2003, [name] was electively admitted to  [facility] under the care of Dr. Sheliah Plane.  He  underwent the following surgical procedure.  Coronary artery bypass grafting  x 4.  Grafts placed at the time of the procedure:  Left internal mammary  artery graft to the left anterior descending artery, saphenous vein graft to  the obtuse marginal artery, saphenous vein graft in a sequential fashion to  the right coronary artery and distal circumflex artery. Vein was harvested  from the right thigh via an endovein harvesting technique as well as  bilateral lower legs with an open surgical technique.  The second procedure:  Aortic valve replacement with a 23 mm St. Jude mechanical prosthesis.  The  third procedure was a septal myomectomy.  The fourth procedure was a  transesophageal echocardiography.   [name] tolerated these procedures well transferring in stable condition  to the SICU.  He remained hemodynamically stable in the immediate period and  was extubated  several hours after arrival in the intensive care unit.  He  required only routine care in the intensive care unit and was ready for  transfer to unit 2000 on postoperative day two.  Coumadin therapy was  started postoperative day one for prosthesis related to his mechanical  valve.   [name] made very good progress in recovering from his surgery.  He  started working with cardiac rehabilitation services on postoperative day  one and has continued throughout this hospitalization.  His hospital course  has been uneventful.   This morning, November 18, 2003, his postoperative day seven on morning  rounds, [name] reports feeling very well.  His vital signs were stable.  His blood pressure is 111/71, afebrile, and his room air saturation is 97%.  His heart is maintained in normal sinus rhythm.  He has a  crisp valve click  on auscultation.  His lungs are clear to auscultation bilaterally.  He is  tolerating his diet without nausea.  His bowel and bladder functions are  within normal limits for him.  His incisions are healing well.  He has no  lower extremity edema.  He is at his preoperative weight. He is now  ambulating independently in the hallway.  His pain is adequately controlled.   [name] is ready for discharge home this morning, November 18, 2003.  Case  management has been following his progress throughout his hospitalization  and has arranged for home health services to include a registered nurse for  cardiac surgery protocol. Home nurse will also draw initial PT and INRs for  Coumadin follow-up.  This will be followed and dosed by the [facility].   LABORATORY DATA:  November 16, 2003, CBC showed white blood cells 13.3,  hemoglobin 8.7, hematocrit 24.3, platelets 295,000.  November 17, 2003,  chemistries included sodium 138, potassium 4.2, BUN 21, creatinine 1.4,  glucose 115.  November 18, 2003, PT 18.0, INR 1.7.   CONDITION ON DISCHARGE:  Improved.   DISCHARGE MEDICATIONS:  1.  Enteric-coated aspirin 81 mg q.d.  2.  Metoprolol 12.5 mg b.i.d.  3.  Lisinopril 5 mg b.i.d.  4.  Lovastatin 20 mg q.d.  5.  Folic acid 1 mg q.d.  6.  Coumadin 7.5 mg q.d. or as directed.  7.  Allopurinol 300 mg q.d.  8.  Tylox 1-2 p.o. q.4h. p.r.n. for moderate to severe pain.  9.  Tylenol 325 mg 1-2 p.o. q.4h. p.r.n. mild pain.   DIET:  Diet should continue to be a low fat, low salt diet.   WOUND CARE:  He may shower with mild soap and water.  If his incisions show  any signs of infection, he is to call Dr. Salina April office.   FOLLOW UP:  PT and INR blood work is to be drawn on Thursday, November 19, 2003 by the home health nurse.  Results should be called to Shelby Dubin at the [facility].  Follow up with Dr. Arturo Morton.  Stuckey on  December 02, 2003.  He will have a chest x-ray taken that day.  He  will have an appointment to see  Dr. Sheliah Plane in the CVTS office in approximately three weeks.  The  office will call for the date and time of that appointment.  He will be  asked to bring his chest x-ray from Dr. Arturo Morton. Stuckey's office for Dr.  Sheliah Plane to review.  [place]   CTK/MEDQ  D:  11/18/2003  T:  11/18/2003  Job:  [id number]   cc:   Patient's chart   Sheliah Plane, MD  [address]  [place]  [place] [address]   Arturo Morton. Riley Kill, M.D. [facility]   Sherryll Burger, M.D.  [facility]  [facility]  [address]  [place], [place] [address]

## 2010-06-17 NOTE — Assessment & Plan Note (Signed)
[facility]                            CARDIOLOGY OFFICE NOTE   David, Montgomery                         MRN:          [id number]  DATE:04/04/2006                            DOB:          1945-05-18    [name] is in for followup.  He is stable.  He has not been having  any significant chest pain.  He has had a little bit of left-arm  discomfort, but it is not necessarily reproducible by activity or  exertion.  He has not been significantly short of breath.  He has had  followup for his INR.  He also has not had followup lipid studies since  a year earlier.  He has not been able to lose any substantial weight.   On his exam, the weight is 234 pounds, blood pressure 136/82, the pulse  is 84 and regular.  The lung fields are clear to auscultation and  percussion.  The valve sound is crisp.  Blood pressure is equal  bilaterally in both arms.  The extremities reveal no edema.   The electrocardiogram demonstrates normal sinus rhythm with premature  atrial contractions, nonspecific T-wave abnormality.   IMPRESSION:  1. Status post aortic valve replacement with coronary artery bypass      graft surgery.  2. Hypercholesterolemia, on lipid-lowering therapy.  3. History of gout, now off allopurinol.   PLAN:  1. Return to clinic in six months.  2. Lipid and liver profile.  3. Continue discussion regarding weight-loss.     Arturo Morton. Riley Kill, MD, [facility]  Electronically Signed    TDS/MedQ  DD: 04/04/2006  DT: 04/04/2006  Job #: [id number]

## 2010-06-17 NOTE — Op Note (Signed)
NAMEJASSIEL, [name] NO.:  [id number]   MEDICAL RECORD NO.:  [id number]          PATIENT TYPE:  OUT   LOCATION:  NINV                         FACILITY:  [facility]   PHYSICIAN:  Zenon Mayo, MDDATE OF BIRTH:  05-13-1945   DATE OF PROCEDURE:  11/11/2003  DATE OF DISCHARGE:                                 OPERATIVE REPORT   PROCEDURE:  Intraoperative transesophageal echocardiogram.   INDICATIONS:  Evaluation of cardiac structures for bypass surgery.   DESCRIPTION OF PROCEDURE:  [name] is a 65 year old gentleman with a  history of coronary artery disease.  He was also found to have aortic  stenosis.  He was brought to the operating room by Dr. Tyrone Sage for bypass  surgery and aortic valve repair.  The patient was brought to the operating  room and placed under general anesthesia.  After endotracheal tube placement  was confirmed, a transesophageal echocardiogram probe was placed into his  esophagus without any resistance to placement.  The initial findings are as  follows:  1.  The left ventricle appears normal in function with an ejection  fraction of approximately 60%.  There is severe left ventricular  hypertrophy, but there are no wall motion abnormalities noted.  The left  ventricular outflow tract has a thickened area of septum measuring 2.4 cm in  diameter with turbulent flow noted in the outflow tract.  There does not  appear to be any systolic anterior motion of the anterior leaflet of the  mitral valve, however.  2.  Mitral valve.  The mitral valve appears  thickened with redundant tissue especially on the anterior leaflet.  However, the valve did appear to coapt well on plain imagery.  However, when  color Doppler was placed on the valve, moderate mitral regurgitation was  noted with a predominantly centralized jet of flow.  On occasion there did  seem to be an eccentric jet coming over the posterior leaflet.  No mitral  valve prolapse was  noted, however, there was some billowing of the anterior  leaflet of the mitral valve.  The regurgitation into the left atrium did not  cause any blunting of pulmonary vein flow.  In fact, pulmonary vein flow  appeared normal with no reversal of flow seen.  3.  Aortic valve.  The  aortic valve is highly calcified and functionally a bicuspid valve with what  appeared to be effusion of the left and noncoronary cusps of the aortic  valve.  The aortic valve area by plane imagery was 1.1 sq cm.  The maximum  gradient calculated was 25-30 mmHg while the mean gradient was 14 mmHg.  4.  Tricuspid valve.  The tricuspid valve was not very well seen.  Trace  tricuspid regurgitation was seen by color Doppler.  5.  Pulmonic valve.  The  pulmonic valve appears normal in structure and function.  No pulmonic  regurgitation is seen.   At the completion of bypass to evaluate repair of the aortic valve, the  heart was again imaged and revealed a mechanical valve that appeared to be  in  good position and functioned well.  There were trace amounts of AI seen.  The left ventricular outflow tract which had undergone myomectomy continued  to have some turbulence across the left ventricular outflow tract, however,  it was reduced from prebypass.  The mitral valve was unchanged from prior to  going on bypass.  The billowing of the anterior leaflet continued and the  mitral regurgitation continued to be of  moderate volume.  There, however, continued to be no effect on pulmonary  venous flow into the left atrium.  The aortic cannula was removed without  any evidence of aortic dissection.  The echocardiogram probe was removed at  the end of the procedure without any resistance and the patient was taken in  stable condition to the ICU.       WEF/MEDQ  D:  11/11/2003  T:  11/11/2003  Job:  [id number]   cc:   Anesthesia Office

## 2010-06-17 NOTE — Discharge Summary (Signed)
NAMEARLEIGH, ODOWD                  ACCOUNT NO.:  [id number]   MEDICAL RECORD NO.:  [id number]          PATIENT TYPE:  INP   LOCATION:  3740                         FACILITY:  [facility]   PHYSICIAN:  Learta Codding, M.D. LHCDATE OF BIRTH:  08-23-1945   DATE OF ADMISSION:  11/27/2003  DATE OF DISCHARGE:  11/28/2003                                 DISCHARGE SUMMARY   PROCEDURES:  None.   HOSPITAL COURSE:  Mr. Decarlo is a 65 year old with known coronary artery  disease.  He had syncope while urinating around 9 a.m.  His only symptom  associated with this was weakness.  A bedside echocardiogram was performed  in the emergency room and did not show any new changes.   Mr. Maalouf was recently hospitalized for bypass surgery and an aortic valve  replacement.  He was discharged on November 12, 2003.  He was admitted  overnight for further evaluation and treatment.   The next day, Mr. Mullett had no complaints, no shortness of breath, no  recurrent syncope, and no chest pain.  His lungs were clear, and his heart  rhythm was stable.  He is to get orthostatics checked prior to discharge,  but the working diagnosis is micturition syncope superimposed on  orthostasis.  He is considered okay for discharge.   CONDITION ON DISCHARGE:  Stable.   DISCHARGE DIAGNOSES:  1.  Micturition syncope.  2.  Status post aortocoronary bypass surgery on November 12, 2003, with left      internal mammary artery to left anterior descending, saphenous vein      graft to obtuse marginal, and saphenous vein graft to right coronary      artery and circumflex.  3.  Aortic valve replacement with a No. 23 Regent mechanical valve done at      bypass.  4.  Anticoagulation with Coumadin.  5.  Hypertension.  6.  Hyperlipidemia.  7.  Status post cholecystectomy and hernia surgery.  8.  Family history of coronary artery disease.   DISCHARGE INSTRUCTIONS:  1.  His activity level is to be as tolerated.  2.  He is to stick to a  low-fat, low-salt diet.  3.  He is to sit down to pass urine.  4.  He is to get a pro time blood test on Wednesday.  5.  He is to see the physician assistant for Dr. Riley Kill on Wednesday,      November 2 at noon.  6.  He is to get an echocardiogram on November 1.  7.  He is to follow up with Dr. Tyrone Sage as scheduled.   DISCHARGE MEDICATIONS:  1.  Metoprolol 12.5 mg b.i.d.  2.  Coumadin 5 mg on Wednesday and 7.5 mg on other days.  3.  Lovastatin 20 mg q.h.s.  4.  Allopurinol 300 mg daily.  5.  Folic acid 1 mg daily.      Rhon   RB/MEDQ  D:  11/28/2003  T:  11/28/2003  Job:  [id number]   cc:   Arturo Morton. Riley Kill, M.D. [facility]   Sheliah Plane, MD  [address]  [place]  [place] [address]

## 2010-06-17 NOTE — Assessment & Plan Note (Signed)
[facility]                              CARDIOLOGY OFFICE NOTE   David, Montgomery                         MRN:          [id number]  DATE:09/05/2005                            DOB:          Oct 03, 1945    [name] is in for a followup visit.  He is not having any major trouble.  He rarely has a minor degree of shortness of breath.  He has been unable to  loose significant weight.   PHYSICAL EXAMINATION:  VITAL SIGNS:  Weight is 230 pounds, blood pressure  130/82, pulse 66.  ABDOMEN:  Bowel sounds are crisp.   EKG reveals normal sinus rhythm, is otherwise unremarkable.   IMPRESSION:  1.  Coronary artery disease and aortic stenosis, status post aortic valve      replacement and coronary artery bypass graft surgery.  2.  Gout.  3.  Hypercholesterolemia on lipid-lowering therapy.   PLAN:  1.  Return to clinic in 6 months.  2.  Continue current medical regimen.  3.  Recommend weight loss for improvement of triglycerides.                              [name]. Riley Kill, MD, [facility]    TDS/MedQ  DD:  09/05/2005  DT:  09/05/2005  Job #:  [id number]

## 2010-06-20 ENCOUNTER — Telehealth: Payer: Self-pay | Admitting: Family

## 2010-06-20 MED ORDER — WARFARIN SODIUM 5 MG PO TABS: ORAL_TABLET | ORAL | Status: DC

## 2010-06-20 NOTE — Telephone Encounter (Signed)
Received notes from Urology.

## 2010-06-28 ENCOUNTER — Ambulatory Visit (INDEPENDENT_AMBULATORY_CARE_PROVIDER_SITE_OTHER): Payer: Medicare Other | Admitting: *Deleted

## 2010-06-28 DIAGNOSIS — Z954 Presence of other heart-valve replacement: Secondary | ICD-10-CM

## 2010-06-28 DIAGNOSIS — I359 Nonrheumatic aortic valve disorder, unspecified: Secondary | ICD-10-CM

## 2010-06-28 LAB — POCT INR: INR: 1.9

## 2010-06-29 ENCOUNTER — Encounter: Payer: Medicare Other | Admitting: *Deleted

## 2010-07-18 ENCOUNTER — Ambulatory Visit (INDEPENDENT_AMBULATORY_CARE_PROVIDER_SITE_OTHER): Payer: Medicare Other | Admitting: *Deleted

## 2010-07-18 DIAGNOSIS — Z954 Presence of other heart-valve replacement: Secondary | ICD-10-CM

## 2010-07-18 DIAGNOSIS — I359 Nonrheumatic aortic valve disorder, unspecified: Secondary | ICD-10-CM

## 2010-07-18 LAB — POCT INR: INR: 2.3

## 2010-07-20 NOTE — Telephone Encounter (Signed)
Most recent visit with Urology was 04/29/10. Note has been received and scanned to Epic.

## 2010-07-22 ENCOUNTER — Other Ambulatory Visit: Payer: Self-pay | Admitting: Family

## 2010-07-28 ENCOUNTER — Other Ambulatory Visit: Payer: Self-pay | Admitting: Family

## 2010-08-15 ENCOUNTER — Encounter: Payer: Medicare Other | Admitting: *Deleted

## 2010-08-16 ENCOUNTER — Other Ambulatory Visit: Payer: Self-pay | Admitting: Family

## 2010-08-17 ENCOUNTER — Ambulatory Visit (INDEPENDENT_AMBULATORY_CARE_PROVIDER_SITE_OTHER): Payer: Medicare Other | Admitting: *Deleted

## 2010-08-17 DIAGNOSIS — I359 Nonrheumatic aortic valve disorder, unspecified: Secondary | ICD-10-CM

## 2010-08-17 DIAGNOSIS — Z954 Presence of other heart-valve replacement: Secondary | ICD-10-CM

## 2010-09-12 ENCOUNTER — Ambulatory Visit (INDEPENDENT_AMBULATORY_CARE_PROVIDER_SITE_OTHER): Payer: Medicare Other | Admitting: *Deleted

## 2010-09-12 DIAGNOSIS — Z954 Presence of other heart-valve replacement: Secondary | ICD-10-CM

## 2010-09-12 DIAGNOSIS — I359 Nonrheumatic aortic valve disorder, unspecified: Secondary | ICD-10-CM

## 2010-09-12 LAB — POCT INR: INR: 2.2

## 2010-09-27 ENCOUNTER — Other Ambulatory Visit: Payer: Self-pay | Admitting: Family

## 2010-09-27 NOTE — Telephone Encounter (Signed)
RX sent. Please call pt for appt.

## 2010-09-27 NOTE — Telephone Encounter (Signed)
OK to send 1 month supply.  Pt needs apt.

## 2010-09-30 NOTE — Telephone Encounter (Signed)
Left message for patient to return my call.

## 2010-10-10 ENCOUNTER — Ambulatory Visit (INDEPENDENT_AMBULATORY_CARE_PROVIDER_SITE_OTHER): Payer: Medicare Other | Admitting: *Deleted

## 2010-10-10 DIAGNOSIS — I359 Nonrheumatic aortic valve disorder, unspecified: Secondary | ICD-10-CM

## 2010-10-10 DIAGNOSIS — Z954 Presence of other heart-valve replacement: Secondary | ICD-10-CM

## 2010-10-12 ENCOUNTER — Other Ambulatory Visit: Payer: Self-pay | Admitting: Family

## 2010-10-12 NOTE — Telephone Encounter (Signed)
Patient will call back for appt.

## 2010-10-25 ENCOUNTER — Ambulatory Visit (INDEPENDENT_AMBULATORY_CARE_PROVIDER_SITE_OTHER): Payer: Medicare Other | Admitting: *Deleted

## 2010-10-25 DIAGNOSIS — I359 Nonrheumatic aortic valve disorder, unspecified: Secondary | ICD-10-CM

## 2010-10-25 DIAGNOSIS — Z954 Presence of other heart-valve replacement: Secondary | ICD-10-CM

## 2010-10-31 ENCOUNTER — Other Ambulatory Visit: Payer: Self-pay | Admitting: Family

## 2010-11-18 ENCOUNTER — Other Ambulatory Visit: Payer: Self-pay | Admitting: *Deleted

## 2010-11-18 MED ORDER — WARFARIN SODIUM 5 MG PO TABS: ORAL_TABLET | ORAL | Status: DC

## 2010-11-22 ENCOUNTER — Ambulatory Visit (INDEPENDENT_AMBULATORY_CARE_PROVIDER_SITE_OTHER): Payer: Medicare Other | Admitting: *Deleted

## 2010-11-22 ENCOUNTER — Other Ambulatory Visit: Payer: Self-pay | Admitting: Family

## 2010-11-22 DIAGNOSIS — Z954 Presence of other heart-valve replacement: Secondary | ICD-10-CM

## 2010-11-22 DIAGNOSIS — I359 Nonrheumatic aortic valve disorder, unspecified: Secondary | ICD-10-CM

## 2010-11-22 NOTE — Telephone Encounter (Signed)
Refill sent to pharmacy for Januvia 100mg  1 tablet daily #7 x no refills. Pt was due for follow up in August. Can only give 1 week supply. Pt must be seen for additional refills. Please arrange appt before 1 week supply runs out.

## 2010-11-22 NOTE — Telephone Encounter (Signed)
Left message for patient to return my call.

## 2010-11-23 NOTE — Telephone Encounter (Signed)
Patient returned phone call and made appointment for 11/25/10

## 2010-11-25 ENCOUNTER — Encounter: Payer: Self-pay | Admitting: Family

## 2010-11-25 ENCOUNTER — Ambulatory Visit (INDEPENDENT_AMBULATORY_CARE_PROVIDER_SITE_OTHER): Payer: Medicare Other | Admitting: Family

## 2010-11-25 VITALS — BP 118/74 | HR 72 | Temp 97.9°F | Resp 16 | Ht 70.0 in | Wt 231.0 lb

## 2010-11-25 DIAGNOSIS — Z23 Encounter for immunization: Secondary | ICD-10-CM

## 2010-11-25 DIAGNOSIS — E785 Hyperlipidemia, unspecified: Secondary | ICD-10-CM

## 2010-11-25 DIAGNOSIS — L989 Disorder of the skin and subcutaneous tissue, unspecified: Secondary | ICD-10-CM

## 2010-11-25 DIAGNOSIS — C61 Malignant neoplasm of prostate: Secondary | ICD-10-CM

## 2010-11-25 DIAGNOSIS — I25810 Atherosclerosis of coronary artery bypass graft(s) without angina pectoris: Secondary | ICD-10-CM

## 2010-11-25 DIAGNOSIS — E119 Type 2 diabetes mellitus without complications: Secondary | ICD-10-CM

## 2010-11-25 DIAGNOSIS — I10 Essential (primary) hypertension: Secondary | ICD-10-CM

## 2010-11-25 DIAGNOSIS — E7800 Pure hypercholesterolemia, unspecified: Secondary | ICD-10-CM

## 2010-11-25 DIAGNOSIS — Z954 Presence of other heart-valve replacement: Secondary | ICD-10-CM

## 2010-11-25 MED ORDER — ATORVASTATIN CALCIUM 20 MG PO TABS: 20.0000 mg | ORAL_TABLET | Freq: Every day | ORAL | Status: DC

## 2010-11-25 MED ORDER — SITAGLIPTIN PHOSPHATE 100 MG PO TABS: 100.0000 mg | ORAL_TABLET | Freq: Every day | ORAL | Status: DC

## 2010-11-25 NOTE — Assessment & Plan Note (Signed)
BP Readings from Last 3 Encounters:  11/25/10 118/74  06/13/10 100/70  03/28/10 130/74   Blood pressure looks good. Continue Toprol.

## 2010-11-25 NOTE — Assessment & Plan Note (Signed)
Coumadin is managed at coumadin clinic.

## 2010-11-25 NOTE — Assessment & Plan Note (Signed)
This is being managed by Dr. Retta Diones and pt reports that his post Radioactive seed PSA was very good.

## 2010-11-25 NOTE — Patient Instructions (Signed)
Please return fasting early next week to complete your lab work. Follow up in 3 months. You will be contacted about your referral to Dermatology.

## 2010-11-25 NOTE — Progress Notes (Signed)
Subjective:    Patient ID: [name], male    DOB: [birth year], 65 y.o.   MRN: [id number]  HPI  [name] is a 65 yr old male who presents today for follow up.  1) DM2- He has not been checking his sugar.  This has not been a focus for him with his recent prostate cancer treatments, but he tells me that he plans to start checking his sugar again regularly.  He is concerned about the cost of the Januvia ($65) a month.    2) HTN- Reports + compliance with medications.  Denies SOB or chest pain. Does report some chronic LE edema.   3) Hyperlipidemia- Currently on atorvastatin.  He is not fasting today.  4) Adenocarcinoma of the prostate-  S/p Radiaoactive seed implant in March.  Had a good follow up PSA and is scheduled to see Dr. Neldon Labella in December.  Had bad diarrhea following seed implant and this has resolved.  Though he reports increased frequency of stools each morning and continued nocturia which he finds discouraging.  5) CAD- Sees Dr. Riley Kill.  He sees him once a year.    6) skin lesion- notes a lesion on the top of his head and on the right cheek that he wishes to have evaluated.      Review of Systems Occasional LE edema  See HPI  Past Medical History  Diagnosis Date  . Allergy   . Hyperlipidemia   . Hypertension   . Cancer 2011    prostate  . Obesity     moderate  . Aortic stenosis   . CAD (coronary artery disease)     cabg  . History of gout   . Heart murmur   . History of hepatitis 1974  . Type II or unspecified type diabetes mellitus without mention of complication, not stated as uncontrolled   . Hemorrhoids   . Sensorineural hearing loss   . Hypertrophic obstructive cardiomyopathy   . Erectile dysfunction     History   Social History  . Marital Status: Married    Spouse Name: N/A    Number of Children: N/A  . Years of Education: N/A   Occupational History  . Not on file.   Social History Main Topics  . Smoking status: Former Smoker      Types: Cigars  . Smokeless tobacco: Not on file  . Alcohol Use: Yes     4 glasses wine a month  . Drug Use: Not on file  . Sexually Active: Not on file   Other Topics Concern  . Not on file   Social History Narrative  . No narrative on file    Past Surgical History  Procedure Date  . Prostate seed implant 3/12  . Coronary artery bypass graft 10/2003  . Aortic valve replacement     St Jude Regent  . Hip surgery 2006    right hip  . Cholecystectomy 1990  . Hernia repair 1999    right, inguinal  . Hernia repair 2002    left, inguinal  . Pilonidal cyst excision 1964  . Tonsillectomy childhood  . Appendectomy 1990    Family History  Problem Relation Age of Onset  . Heart disease Mother   . Stroke Mother   . Heart disease Father   . Coronary artery disease Brother   . Asperger's syndrome Son   . Hyperlipidemia Son   . Cancer Neg Hx     negative for colon cancer  No Known Allergies  Current Outpatient Prescriptions on File Prior to Visit  Medication Sig Dispense Refill  . BENICAR HCT 20-12.5 MG per tablet TAKE ONE (1) TABLET EACH DAY  30 tablet  0  . colchicine 0.6 MG tablet Take 2 tablets by mouth at start of gout flare, may take 1 tablet 1 hour later as needed.       Marland Kitchen glucose blood (FREESTYLE TEST STRIPS) test strip Use to check blood sugar once a day.       . Lancets (FREESTYLE) lancets Use to check blood sugar once a day as directed.  100 each  1  . leuprolide (LUPRON) 30 MG injection Inject 30 mg into the muscle every 4 (four) months.        . metFORMIN (GLUCOPHAGE) 500 MG tablet TAKE ONE TABLET TWICE DAILY WITH A MEAL  60 tablet  0  . metoprolol succinate (TOPROL-XL) 25 MG 24 hr tablet TAKE ONE (1) TABLET EACH DAY  30 tablet  1  . Omega-3 Fatty Acids (FISH OIL) 1000 MG CAPS Take 2 capsules by mouth 2 (two) times daily.        Marland Kitchen warfarin (COUMADIN) 5 MG tablet Take 1 1/2 tablets on Monday and Friday and 1 tablet all other days.  40 tablet  3  . DISCONTD:  metFORMIN (GLUCOPHAGE) 500 MG tablet TAKE ONE TABLET TWICE DAILY WITH A MEAL  60 tablet  0    BP 118/74  Pulse 72  Temp(Src) 97.9 F (36.6 C) (Oral)  Resp 16  Ht 5\' 10"  (1.778 m)  Wt 231 lb (104.781 kg)  BMI 33.15 kg/m2       Objective:   Physical Exam  Constitutional: He is oriented to person, place, and time. He appears well-developed and well-nourished. No distress.  HENT:  Head: Normocephalic and atraumatic.  Eyes: Conjunctivae are normal. Right eye exhibits no discharge.  Neck: Neck supple.  Cardiovascular: Normal rate and regular rhythm.   No murmur heard. Pulmonary/Chest: Effort normal and breath sounds normal. No respiratory distress. He has no wheezes. He has no rales. He exhibits no tenderness.  Abdominal: Bowel sounds are normal.  Musculoskeletal:       1-2+ bilateral LE edema  Lymphadenopathy:    He has no cervical adenopathy.  Neurological: He is alert and oriented to person, place, and time.  Skin: Skin is warm and dry. No rash noted.       Raised dry lesion note on right side of forehead. Similar lesion noted on scalp.  Psychiatric: He has a normal mood and affect. His behavior is normal. Judgment and thought content normal.          Assessment & Plan:  Januvia 100mg  #35 lot [id number] exp [id number]

## 2010-11-25 NOTE — Assessment & Plan Note (Signed)
Check A1C.  Samples of Januvia provided today along with a coupon card.

## 2010-11-25 NOTE — Assessment & Plan Note (Signed)
Clinically stable.  Continue beta blocker, check FLP.

## 2010-11-25 NOTE — Assessment & Plan Note (Signed)
He will return on Monday for a fasting lipid panel and to complete his other lab work.

## 2010-11-28 LAB — HEMOGLOBIN A1C
Hgb A1c MFr Bld: 7.2 % — ABNORMAL HIGH (ref <5.7)
Mean Plasma Glucose: 160 mg/dL — ABNORMAL HIGH (ref <117)

## 2010-11-28 LAB — HEPATIC FUNCTION PANEL
Bilirubin, Direct: 0.1 mg/dL (ref 0.0–0.3)
Indirect Bilirubin: 0.5 mg/dL (ref 0.0–0.9)
Total Bilirubin: 0.6 mg/dL (ref 0.3–1.2)
Total Protein: 7.6 g/dL (ref 6.0–8.3)

## 2010-11-28 LAB — LIPID PANEL: LDL Cholesterol: 54 mg/dL (ref 0–99)

## 2010-11-29 ENCOUNTER — Telehealth: Payer: Self-pay | Admitting: Family

## 2010-11-29 ENCOUNTER — Other Ambulatory Visit: Payer: Self-pay | Admitting: Family

## 2010-11-29 DIAGNOSIS — R197 Diarrhea, unspecified: Secondary | ICD-10-CM

## 2010-11-29 DIAGNOSIS — E785 Hyperlipidemia, unspecified: Secondary | ICD-10-CM

## 2010-11-29 DIAGNOSIS — R945 Abnormal results of liver function studies: Secondary | ICD-10-CM

## 2010-11-29 LAB — BASIC METABOLIC PANEL WITH GFR
GFR, Est African American: 78 mL/min — ABNORMAL LOW (ref >90)
GFR, Est Non African American: 67 mL/min — ABNORMAL LOW (ref >90)
Potassium: 4.4 mEq/L (ref 3.5–5.3)
Sodium: 138 mEq/L (ref 135–145)

## 2010-11-29 LAB — MICROALBUMIN / CREATININE URINE RATIO
Creatinine, Urine: 89.6 mg/dL
Microalb Creat Ratio: 21.4 mg/g (ref 0.0–30.0)

## 2010-11-29 NOTE — Telephone Encounter (Signed)
Left message on machine to return my call. 

## 2010-11-29 NOTE — Telephone Encounter (Signed)
Pls call pt and let him know that his A1C is 7.2.  We would like it less than 7.  I would like for him to increase his metformin to 1 and 1/2 tabs twice daily please and work on diet, exercise and weight loss.  He should reduce his intake of carbohydrates and avoid anything containing high fructose corn syrup.  Also, please make sure that he is taking his fish oil BID this is important for his triglycerides.  His LFT's are up a bit.  This could be due to fatty liver.  I would like for him to complete labs as below.  Follow up in 3 months.

## 2010-11-29 NOTE — Telephone Encounter (Signed)
Tests have been added per Gertie Gowda at [place]. Pt has been notified of results.

## 2010-11-30 ENCOUNTER — Other Ambulatory Visit: Payer: Self-pay | Admitting: Family

## 2010-11-30 ENCOUNTER — Telehealth: Payer: Self-pay | Admitting: Family

## 2010-11-30 DIAGNOSIS — R7989 Other specified abnormal findings of blood chemistry: Secondary | ICD-10-CM

## 2010-11-30 LAB — CERULOPLASMIN: Ceruloplasmin: 27 mg/dL (ref 20–60)

## 2010-11-30 NOTE — Telephone Encounter (Signed)
Pls call patient and let him know that his ferritin (iron test is up a bit).   Sometimes this can cause liver funtion to elevate.  I would like for him to see Dr. Myna Hidalgo across the hall who is a hematologist for further recommendations.

## 2010-11-30 NOTE — Telephone Encounter (Signed)
Pt notified. Pt will not be able to accept an appt  between 11am to 1pm on any day.

## 2010-12-06 MED ORDER — METFORMIN HCL 500 MG PO TABS: ORAL_TABLET | ORAL | Status: DC

## 2010-12-06 NOTE — Telephone Encounter (Signed)
Addended by: Sandford Craze on: 12/06/2010 11:15 AM   Modules accepted: Orders

## 2010-12-06 NOTE — Telephone Encounter (Signed)
Addended by: Mervin Kung A on: 12/06/2010 09:01 AM   Modules accepted: Orders

## 2010-12-06 NOTE — Telephone Encounter (Addendum)
Pt called requesting refill of metformin to reflect new directions with sufficient quantity. Refill sent for 1 1/2 tablets twice a day #90 x 2 refills. Pt notified.

## 2010-12-08 ENCOUNTER — Other Ambulatory Visit: Payer: Self-pay | Admitting: Family

## 2010-12-08 NOTE — Telephone Encounter (Signed)
Refill sent to pharmacy 1 1/5 tablets twice a day #90 x 2 refills as pharmacy did not received previous authorization on 12/06/10.

## 2010-12-09 ENCOUNTER — Telehealth: Payer: Self-pay | Admitting: Hematology & Oncology

## 2010-12-09 NOTE — Telephone Encounter (Signed)
clled pt to schedule appointment no answer or machine

## 2010-12-14 ENCOUNTER — Telehealth: Payer: Self-pay | Admitting: Hematology & Oncology

## 2010-12-14 NOTE — Telephone Encounter (Signed)
Pt aware of 02-01-12 appointment °

## 2010-12-20 ENCOUNTER — Ambulatory Visit (INDEPENDENT_AMBULATORY_CARE_PROVIDER_SITE_OTHER): Payer: Medicare Other | Admitting: *Deleted

## 2010-12-20 DIAGNOSIS — I359 Nonrheumatic aortic valve disorder, unspecified: Secondary | ICD-10-CM

## 2010-12-20 DIAGNOSIS — Z954 Presence of other heart-valve replacement: Secondary | ICD-10-CM

## 2011-01-03 ENCOUNTER — Telehealth: Payer: Self-pay | Admitting: *Deleted

## 2011-01-03 NOTE — Telephone Encounter (Signed)
Pt moved 12-14 to 12-26

## 2011-01-13 ENCOUNTER — Ambulatory Visit: Payer: Medicare Other | Admitting: Hematology & Oncology

## 2011-01-13 ENCOUNTER — Ambulatory Visit: Payer: Medicare Other | Admitting: Family

## 2011-01-13 ENCOUNTER — Other Ambulatory Visit: Payer: Medicare Other | Admitting: Lab

## 2011-01-13 ENCOUNTER — Ambulatory Visit: Payer: Medicare Other

## 2011-01-17 ENCOUNTER — Encounter: Payer: Medicare Other | Admitting: *Deleted

## 2011-01-19 ENCOUNTER — Ambulatory Visit (INDEPENDENT_AMBULATORY_CARE_PROVIDER_SITE_OTHER): Payer: Medicare Other | Admitting: *Deleted

## 2011-01-19 DIAGNOSIS — Z954 Presence of other heart-valve replacement: Secondary | ICD-10-CM

## 2011-01-19 DIAGNOSIS — I359 Nonrheumatic aortic valve disorder, unspecified: Secondary | ICD-10-CM

## 2011-01-25 ENCOUNTER — Other Ambulatory Visit: Payer: Self-pay | Admitting: Hematology & Oncology

## 2011-01-25 ENCOUNTER — Ambulatory Visit (HOSPITAL_BASED_OUTPATIENT_CLINIC_OR_DEPARTMENT_OTHER): Payer: Medicare Other | Admitting: Hematology & Oncology

## 2011-01-25 ENCOUNTER — Ambulatory Visit: Payer: Medicare Other

## 2011-01-25 ENCOUNTER — Other Ambulatory Visit: Payer: Medicare Other | Admitting: Lab

## 2011-01-25 VITALS — BP 121/73 | HR 71 | Temp 97.1°F | Ht 70.0 in | Wt 234.0 lb

## 2011-01-25 DIAGNOSIS — R7989 Other specified abnormal findings of blood chemistry: Secondary | ICD-10-CM

## 2011-01-25 DIAGNOSIS — C229 Malignant neoplasm of liver, not specified as primary or secondary: Secondary | ICD-10-CM

## 2011-01-25 DIAGNOSIS — C61 Malignant neoplasm of prostate: Secondary | ICD-10-CM

## 2011-01-25 LAB — CBC WITH DIFFERENTIAL (CANCER CENTER ONLY)
BASO#: 0.1 10*3/uL (ref 0.0–0.2)
BASO%: 0.7 % (ref 0.0–2.0)
HCT: 35.6 % — ABNORMAL LOW (ref 38.7–49.9)
LYMPH%: 14.6 % (ref 14.0–48.0)
MCV: 90 fL (ref 82–98)
MONO#: 0.6 10*3/uL (ref 0.1–0.9)
NEUT%: 73.6 % (ref 40.0–80.0)
RDW: 13.1 % (ref 11.1–15.7)
WBC: 7.2 10*3/uL (ref 4.0–10.0)

## 2011-01-25 NOTE — Progress Notes (Signed)
CC:   Sandford Craze, NP Bertram Millard. Dahlstedt, M.D. Arturo Morton. Riley Kill, MD, [facility]  DIAGNOSES: 1. Elevated ferritin - possible hemochromatosis. 2. History of early stage (T2a) adenocarcinoma of the prostate.  HISTORY OF PRESENT ILLNESS:  Mr. Costabile is a really nice 65 year old gentleman.  He and his wife moved down here a few years ago from [place]  He has multiple health issues.  He has had open heart surgery for coronary artery disease and aortic valve replacement.  He has a mechanical aortic valve.  He is on chronic Coumadin.  He was found have a PSA of almost 14 back in May 2011.  He ultimately underwent radiation therapy.  Dr. Kathrynn Running of Radiation Oncology saw him. He did the external beam radiation with the TrueBeam.  He also had prostate seed implants.  He received 4500 rad to the prostate and pelvic lymph nodes.  He then had an additional 155 Gy with the seed implants with I-125.  He is androgen deprivation with Lupron.  Dr. Retta Diones is seeing him for this.  He is seen by Dr. Peggyann Juba over at [facility].  She did some lab work on him.  She had found that he had an elevated ferritin of 420. She also did some hepatitis studies.  Hepatitis B and C were all negative.  On 11/25/2010, liver function tests were all within normal limits.  He had a bilirubin of 0.6.  He had normal renal function.  His cholesterol was okay, although his triglycerides were a little on the high side.  She did run a ceruloplasmin on him.  This was okay at 22.  His recent PSA from Dr. Lenoria Chime office, I think, is 0.  He was kindly referred to the [facility] because of the elevated ferritin, mostly for Dr. Arvil Chaco concern regarding hemochromatosis.  As far as he knows, there is no history of cirrhosis in the family.  He is not aware of anybody dying of liver cancer in the family.  There is a history of diabetes in the family.  His last colonoscopy was, I  think, 3 years ago.  Everything turned out okay with that.  He feels okay overall.  He has had no problems with his diabetes.  He is trying to be aggressive with managing this.  PAST MEDICAL HISTORY:  Remarkable for: 1. Non-insulin-dependent diabetes. 2. Early stage prostate cancer. 3. Coronary artery disease, status post CABG. 4. Aortic valve replacement. 5. Hyperlipidemia. 6. Gout.  ALLERGIES:  none.  MEDICATIONS: 1. Benicar/hydrochlorothiazide 20/12.5 mg 1 p.o. daily. 2. Lupron 30 mg IM q.4 months. 3. Metformin 500 mg p.o. b.i.d. 4. Toprol-XL 25 mg p.o. daily. 5. Crestor 10 mg p.o. daily. 6. Januvia 100 mg p.o. daily. 7. Coumadin 5 mg p.o. daily.  SOCIAL HISTORY:  Negative for tobacco use.  He has no significant alcohol use.  He has no obvious occupational exposures.  FAMILY HISTORY:  Remarkable for the diabetes.  REVIEW OF SYSTEMS:  As stated in the history of present illness.  No additional findings noted on a 12-system review.  PHYSICAL EXAMINATION:  This is a well-developed, well-nourished, white gentleman in no obvious distress.  Vital signs show a temperature of 97.1, pulse 71, respiratory rate of 18, blood pressure 121/73.  Weight is 234.  Head and neck exam shows a normocephalic, atraumatic skull. There are no ocular or oral lesions.  There are no palpable cervical or supraclavicular lymph nodes.  Lungs are clear to percussion and auscultation bilaterally.  There are no rales, wheezes or rhonchi. Cardiac:  Regular rate and rhythm with normal S1 and S2.  He does have a systolic click secondary to the aortic valve.  He has 1/6 systolic ejection murmur related to the aortic valve.  His abdominal exam is soft with good bowel sounds.  There is no fluid wave.  There is no abdominal mass.  There is no palpable hepatosplenomegaly.  Back:  No tenderness over the spine, ribs, or hips.  Extremities shows no clubbing, cyanosis or edema.  Neurological exam shows no focal  neurological deficits. Skin:  No rashes, ecchymosis or petechia.  LABORATORY STUDIES:  White cell count 7.2, hemoglobin 13, hematocrit 35.6, platelet count 169.  MCV is 90.  IMPRESSION:  Mr. Rafter is a 65 year old gentleman with an elevated ferritin.  It is unlikely that he has hemochromatosis.  However, we are checking his actual iron studies.  We will see what his iron saturation is.  I also sent off the hemochromatosis genetic assay.  I suppose that he could have hemochromatosis that has just never been "uncovered."  He has had surgery in the past with some bleeding so it is possible that he may have had his own "phlebotomy" with surgical procedures.  If he does have hemochromatosis, we will be aggressive in phlebotomizing him and keeping his ferritin below 100.  If the hemochromatosis assay is negative, we will have to say that the ferritin could be in an "acute-phase reactant."  I am sending off an alpha-fetoprotein level on him to make sure that there is no underlying hepatoma which could cause ferritin elevations.  I do not see a need for a bone marrow test on him.  I do not see the need for any other additional testing.  He does have a fairly high Gleason score.  He is going to be at significant risk for prostate cancer recurrence.  He is on androgen deprivation right now.  It sounds like this may be discontinued sometime this year.  I think recent studies have shown that intermittent androgen deprivation is as effective as continual androgen deprivation without irritating the side effects of "male menopause."  Mr. Sookram currently looks good.  He has handled his prostate cancer therapy quite well.  Hopefully his outcome from the prostate cancer will be good.  He was treated quite aggressively by Dr. Kathrynn Running, so this hopefully will give him a good long-term outlook.  For now, we will be back in touch with Mr. Spellman.  Again, if his hemochromatosis genetic assay is  negative, I do not see that we have to get him back to the clinic.  I just do not see that we would be adding anything to his medical care.  If his hemochromatosis assays is positive, then we will get him back and get him on a phlebotomy program.  I spent about an hour with he and his wife.  They are both very nice people.    ______________________________ Josph Macho, M.D. PRE/MEDQ  D:  01/25/2011  T:  01/25/2011  Job:  [id number]

## 2011-02-01 LAB — COMPREHENSIVE METABOLIC PANEL
ALT: 45 U/L (ref 0–53)
Albumin: 4.5 g/dL (ref 3.5–5.2)
CO2: 28 mEq/L (ref 19–32)
Calcium: 9.1 mg/dL (ref 8.4–10.5)
Chloride: 103 mEq/L (ref 96–112)
Creatinine, Ser: 1.31 mg/dL (ref 0.50–1.35)
Potassium: 4.4 mEq/L (ref 3.5–5.3)
Sodium: 142 mEq/L (ref 135–145)
Total Protein: 7.3 g/dL (ref 6.0–8.3)

## 2011-02-01 LAB — HEMOCHROMATOSIS DNA-PCR(C282Y,H63D)

## 2011-02-01 LAB — IRON AND TIBC: Iron: 104 ug/dL (ref 42–165)

## 2011-02-06 DIAGNOSIS — C61 Malignant neoplasm of prostate: Secondary | ICD-10-CM | POA: Diagnosis not present

## 2011-02-07 DIAGNOSIS — L570 Actinic keratosis: Secondary | ICD-10-CM | POA: Diagnosis not present

## 2011-02-14 ENCOUNTER — Encounter: Payer: Self-pay | Admitting: Hematology & Oncology

## 2011-02-14 ENCOUNTER — Other Ambulatory Visit: Payer: Self-pay | Admitting: Hematology & Oncology

## 2011-02-14 ENCOUNTER — Ambulatory Visit (INDEPENDENT_AMBULATORY_CARE_PROVIDER_SITE_OTHER): Payer: Medicare Other | Admitting: Family

## 2011-02-14 ENCOUNTER — Encounter: Payer: Self-pay | Admitting: Family

## 2011-02-14 VITALS — BP 116/70 | HR 78 | Temp 97.8°F | Resp 16 | Ht 70.0 in | Wt 234.1 lb

## 2011-02-14 DIAGNOSIS — C61 Malignant neoplasm of prostate: Secondary | ICD-10-CM

## 2011-02-14 DIAGNOSIS — I10 Essential (primary) hypertension: Secondary | ICD-10-CM | POA: Diagnosis not present

## 2011-02-14 DIAGNOSIS — E119 Type 2 diabetes mellitus without complications: Secondary | ICD-10-CM

## 2011-02-14 DIAGNOSIS — L570 Actinic keratosis: Secondary | ICD-10-CM | POA: Insufficient documentation

## 2011-02-14 DIAGNOSIS — R197 Diarrhea, unspecified: Secondary | ICD-10-CM

## 2011-02-14 DIAGNOSIS — H9190 Unspecified hearing loss, unspecified ear: Secondary | ICD-10-CM | POA: Diagnosis not present

## 2011-02-14 HISTORY — DX: Actinic keratosis: L57.0

## 2011-02-14 HISTORY — DX: Hemochromatosis, unspecified: E83.119

## 2011-02-14 NOTE — Assessment & Plan Note (Signed)
Not checking sugars regularly.  Obtain A1C. Continue metformin and januvia.

## 2011-02-14 NOTE — Progress Notes (Signed)
Subjective:    Patient ID: KEYONTE COOKSTON, male    DOB: 1945/12/28, 66 y.o.   MRN: [id number]  HPI  Mr.  Rios is a 66 yr old male who presents today for follow up.  1)  Derm- he is undergoing treatment for Actinic Keratosis with dermatology.  2) Hematology- Saw Dr. Myna Hidalgo. He is undergoing evaluation for elevated ferritin.  3) Diarrhea-  Has post radiation diarrhea. He is hopefull that this will continue to improve the farther out he gets from his radiation.  Last treatment was in July.   4) Prostate CA- Continues lupron. Reports that his follow up PSA level was very low and Urology was pleased.    5) DM2-  Has not been checking sugar reg.  He is taking one tablet bid metformin.  Diet- unchanged.    6) HTN-   Pt reports that he sometimes becomes dizzy when he is laying flat and lifts his head.  Wants to cut back on his BP medication.           Review of Systems See HPI  Past Medical History  Diagnosis Date  . Allergy   . Hyperlipidemia   . Hypertension   . Cancer 2011    prostate  . Obesity     moderate  . Aortic stenosis   . CAD (coronary artery disease)     cabg  . History of gout   . Heart murmur   . History of hepatitis 1974  . Type II or unspecified type diabetes mellitus without mention of complication, not stated as uncontrolled   . Hemorrhoids   . Sensorineural hearing loss   . Hypertrophic obstructive cardiomyopathy   . Erectile dysfunction     History   Social History  . Marital Status: Married    Spouse Name: N/A    Number of Children: N/A  . Years of Education: N/A   Occupational History  . Not on file.   Social History Main Topics  . Smoking status: Former Smoker    Types: Cigars  . Smokeless tobacco: Not on file  . Alcohol Use: Yes     4 glasses wine a month  . Drug Use: Not on file  . Sexually Active: Not on file   Other Topics Concern  . Not on file   Social History Narrative  . No narrative on file    Past Surgical  History  Procedure Date  . Prostate seed implant 3/12  . Coronary artery bypass graft 10/2003  . Aortic valve replacement     [facility]  . Hip surgery 2006    right hip  . Cholecystectomy 1990  . Hernia repair 1999    right, inguinal  . Hernia repair 2002    left, inguinal  . Pilonidal cyst excision 1964  . Tonsillectomy childhood  . Appendectomy 1990    Family History  Problem Relation Age of Onset  . Heart disease Mother   . Stroke Mother   . Heart disease Father   . Coronary artery disease Brother   . Asperger's syndrome Son   . Hyperlipidemia Son   . Cancer Neg Hx     negative for colon cancer    No Known Allergies  Current Outpatient Prescriptions on File Prior to Visit  Medication Sig Dispense Refill  . glucose blood (FREESTYLE TEST STRIPS) test strip Use to check blood sugar once a day.       . Lancets (FREESTYLE) lancets Use to check  blood sugar once a day as directed.  100 each  1  . leuprolide (LUPRON) 30 MG injection Inject 30 mg into the muscle every 4 (four) months.        . metFORMIN (GLUCOPHAGE) 500 MG tablet Take 1 1/2 tablets twice a day.  90 tablet  2  . metoprolol succinate (TOPROL-XL) 25 MG 24 hr tablet TAKE ONE (1) TABLET EACH DAY  30 tablet  2  . Omega-3 Fatty Acids (FISH OIL) 1000 MG CAPS Take 2 capsules by mouth 2 (two) times daily.        . sitaGLIPtin (JANUVIA) 100 MG tablet Take 1 tablet (100 mg total) by mouth daily.  30 tablet  2  . warfarin (COUMADIN) 5 MG tablet Take 1 1/2 tablets on Monday and Friday and 1 tablet all other days.  40 tablet  3    BP 116/70  Pulse 78  Temp(Src) 97.8 F (36.6 C) (Oral)  Resp 16  Ht 5\' 10"  (1.778 m)  Wt 234 lb 1.3 oz (106.178 kg)  BMI 33.59 kg/m2       Objective:   Physical Exam  Constitutional: He is oriented to person, place, and time. He appears well-developed and well-nourished.  HENT:  Head: Normocephalic and atraumatic.  Right Ear: Tympanic membrane and ear canal normal.  Left Ear:  Tympanic membrane and ear canal normal.  Eyes: Conjunctivae are normal. Pupils are equal, round, and reactive to light.  Neck: Neck supple. No thyromegaly present.  Cardiovascular: Normal rate and regular rhythm.   No murmur heard. Pulmonary/Chest: Effort normal and breath sounds normal. No respiratory distress. He has no wheezes. He has no rales. He exhibits no tenderness.  Abdominal: Soft. Bowel sounds are normal.  Musculoskeletal: He exhibits no edema.  Neurological: He is alert and oriented to person, place, and time.  Skin: Skin is warm and dry. No erythema.  Psychiatric: He has a normal mood and affect. His behavior is normal. Judgment and thought content normal.          Assessment & Plan:

## 2011-02-14 NOTE — Patient Instructions (Signed)
Please call us in 1 week with your blood pressure readings.  Follow up in 3 months.

## 2011-02-14 NOTE — Assessment & Plan Note (Signed)
Diarrhea due to prostate radiation.  Hopefully with time it will continue to improve.  Reassurance provided.

## 2011-02-14 NOTE — Assessment & Plan Note (Signed)
BP Readings from Last 3 Encounters:  02/14/11 116/70  01/25/11 121/73  11/25/10 118/74   Will try cutting benicar HCT in half.  He will contact us in 1 week with his home BP readings.

## 2011-02-14 NOTE — Assessment & Plan Note (Signed)
This is new and is being managed by dermatology.

## 2011-02-14 NOTE — Assessment & Plan Note (Signed)
He continues lupron.  Management per Urology.

## 2011-02-15 ENCOUNTER — Encounter: Payer: Self-pay | Admitting: Family

## 2011-02-15 LAB — HEMOGLOBIN A1C
Hgb A1c MFr Bld: 7.1 % — ABNORMAL HIGH (ref <5.7)
Mean Plasma Glucose: 157 mg/dL — ABNORMAL HIGH (ref <117)

## 2011-02-16 ENCOUNTER — Ambulatory Visit (INDEPENDENT_AMBULATORY_CARE_PROVIDER_SITE_OTHER): Payer: Medicare Other | Admitting: *Deleted

## 2011-02-16 DIAGNOSIS — I359 Nonrheumatic aortic valve disorder, unspecified: Secondary | ICD-10-CM | POA: Diagnosis not present

## 2011-02-16 DIAGNOSIS — H905 Unspecified sensorineural hearing loss: Secondary | ICD-10-CM | POA: Diagnosis not present

## 2011-02-16 DIAGNOSIS — H9190 Unspecified hearing loss, unspecified ear: Secondary | ICD-10-CM | POA: Diagnosis not present

## 2011-02-16 DIAGNOSIS — Z954 Presence of other heart-valve replacement: Secondary | ICD-10-CM

## 2011-02-16 LAB — POCT INR: INR: 1.9

## 2011-02-17 ENCOUNTER — Ambulatory Visit (HOSPITAL_BASED_OUTPATIENT_CLINIC_OR_DEPARTMENT_OTHER): Payer: Medicare Other

## 2011-02-17 NOTE — Progress Notes (Signed)
9:57 AM Tolerated phlebotomy well, observed x 30 minutes post procedure. Teola Bradley, Loyed Wilmes [employer]

## 2011-02-24 ENCOUNTER — Telehealth: Payer: Self-pay | Admitting: *Deleted

## 2011-02-24 ENCOUNTER — Ambulatory Visit (HOSPITAL_BASED_OUTPATIENT_CLINIC_OR_DEPARTMENT_OTHER): Payer: Medicare Other

## 2011-02-24 NOTE — Telephone Encounter (Signed)
Pt came by office and dropped off BP readings. 116/72, 114/74, 116/72, 144/81, 130/85 and 163/78. Pt states that his highest BP reading was when he donated blood and he was upset. Per Sandford Craze, NP, ok for pt to continue to check BP and should call us if he has continued elevated readings.  Left message on machine to return my call.

## 2011-02-24 NOTE — Progress Notes (Signed)
[name] presents today for phlebotomy per MD orders. Phlebotomy procedure started at 0915 and ended at 0920 500 grams removed. Patient observed for 30 minutes after procedure without any incident. Patient tolerated procedure well. IV needle removed intact.

## 2011-02-24 NOTE — Telephone Encounter (Signed)
Notified pt. He states the last 3 readings given were all taken this morning at his phlebotomy appt and thinks that was the reason for the elevation. Pt will call if he has continued elevated BP readings.

## 2011-03-03 ENCOUNTER — Ambulatory Visit (HOSPITAL_BASED_OUTPATIENT_CLINIC_OR_DEPARTMENT_OTHER): Payer: Medicare Other

## 2011-03-03 NOTE — Patient Instructions (Signed)
Therapeutic Phlebotomy Therapeutic phlebotomy is the controlled removal of blood from your body for the purpose of treating a medical condition. It is similar to donating blood. Usually, about a pint (470 mL) of blood is removed. The average adult has 9 to 12 pints (4.3 to 5.7 L) of blood. Therapeutic phlebotomy may be used to treat the following medical conditions:  Hemochromatosis. This is a condition in which there is too much iron in the blood.   Polycythemia vera. This is a condition in which there are too many red cells in the blood.   Porphyria cutanea tarda. This is a disease usually passed from one generation to the next (inherited). It is a condition in which an important part of hemoglobin is not made properly. This results in the build up of abnormal amounts of porphyrins in the body.   Sickle cell disease. This is an inherited disease. It is a condition in which the red blood cells form an abnormal crescent shape rather than a round shape.  LET YOUR CAREGIVER KNOW ABOUT:  Allergies.   Medicines taken including herbs, eyedrops, over-the-counter medicines, and creams.   Use of steroids (by mouth or creams).   Previous problems with anesthetics or numbing medicine.   History of blood clots.   History of bleeding or blood problems.   Previous surgery.   Possibility of pregnancy, if this applies.  RISKS AND COMPLICATIONS This is a simple and safe procedure. Problems are unlikely. However, problems can occur and may include:  Nausea or lightheadedness.   Low blood pressure.   Soreness, bleeding, swelling, or bruising at the needle insertion site.   Infection.  BEFORE THE PROCEDURE  This is a procedure that can be done as an outpatient. Confirm the time that you need to arrive for your procedure. Confirm whether there is a need to fast or withhold any medications. It is helpful to wear clothing with sleeves that can be raised above the elbow. A blood sample may be done  to determine the amount of red blood cells or iron in your blood. Plan ahead of time to have someone drive you home after the procedure. PROCEDURE The entire procedure from preparation through recovery takes about 1 hour. The actual collection takes about 10 to 15 minutes.  A needle will be inserted into your vein.   Tubing and a collection bag will be attached to that needle.   Blood will flow through the needle and tubing into the collection bag.   You may be asked to open and close your hand slowly and continuously during the entire collection.   Once the specified amount of blood has been removed from your body, the collection bag and tubing will be clamped.   The needle will be removed.   Pressure will be held on the site of the needle insertion to stop the bleeding. Then a bandage will be placed over the needle insertion site.  AFTER THE PROCEDURE  Your recovery will be assessed and monitored. If there are no problems, as an outpatient, you should be able to go home shortly after the procedure.  Document Released: 06/20/2010 Document Revised: 09/28/2010 Document Reviewed: 06/20/2010 [employer] Patient Information 2012 [employer]. 

## 2011-03-07 DIAGNOSIS — L570 Actinic keratosis: Secondary | ICD-10-CM | POA: Diagnosis not present

## 2011-03-09 ENCOUNTER — Ambulatory Visit (INDEPENDENT_AMBULATORY_CARE_PROVIDER_SITE_OTHER): Payer: Medicare Other

## 2011-03-09 DIAGNOSIS — I359 Nonrheumatic aortic valve disorder, unspecified: Secondary | ICD-10-CM | POA: Diagnosis not present

## 2011-03-09 DIAGNOSIS — H905 Unspecified sensorineural hearing loss: Secondary | ICD-10-CM | POA: Diagnosis not present

## 2011-03-09 DIAGNOSIS — Z954 Presence of other heart-valve replacement: Secondary | ICD-10-CM

## 2011-03-09 LAB — POCT INR: INR: 2

## 2011-03-10 ENCOUNTER — Ambulatory Visit: Payer: Medicare Other

## 2011-03-16 ENCOUNTER — Other Ambulatory Visit: Payer: Self-pay | Admitting: Family

## 2011-03-17 DIAGNOSIS — E119 Type 2 diabetes mellitus without complications: Secondary | ICD-10-CM | POA: Diagnosis not present

## 2011-03-17 LAB — HM DIABETES EYE EXAM

## 2011-03-20 ENCOUNTER — Encounter: Payer: Self-pay | Admitting: Family

## 2011-03-28 ENCOUNTER — Other Ambulatory Visit: Payer: Self-pay | Admitting: Family

## 2011-03-31 ENCOUNTER — Ambulatory Visit: Payer: Medicare Other | Admitting: Hematology & Oncology

## 2011-03-31 ENCOUNTER — Ambulatory Visit (HOSPITAL_BASED_OUTPATIENT_CLINIC_OR_DEPARTMENT_OTHER): Payer: Medicare Other | Admitting: Hematology & Oncology

## 2011-03-31 ENCOUNTER — Other Ambulatory Visit: Payer: Medicare Other | Admitting: Lab

## 2011-03-31 ENCOUNTER — Other Ambulatory Visit (HOSPITAL_BASED_OUTPATIENT_CLINIC_OR_DEPARTMENT_OTHER): Payer: Medicare Other | Admitting: Lab

## 2011-03-31 LAB — CBC WITH DIFFERENTIAL (CANCER CENTER ONLY)
BASO#: 0 10*3/uL (ref 0.0–0.2)
BASO%: 0.2 % (ref 0.0–2.0)
HCT: 35.2 % — ABNORMAL LOW (ref 38.7–49.9)
LYMPH%: 10.7 % — ABNORMAL LOW (ref 14.0–48.0)
MCV: 91 fL (ref 82–98)
MONO#: 0.7 10*3/uL (ref 0.1–0.9)
NEUT%: 80 % (ref 40.0–80.0)
RDW: 13.4 % (ref 11.1–15.7)
WBC: 9.6 10*3/uL (ref 4.0–10.0)

## 2011-03-31 NOTE — Progress Notes (Signed)
CC:   Sandford Craze, NP Bertram Millard. Dahlstedt, M.D. Arturo Morton. Riley Kill, MD, [facility]  DIAGNOSES: 1. Hemochromatosis, homozygous with a H63D mutation. 2. History of stage I prostate cancer.  CURRENT THERAPY:  Phlebotomy to maintain ferritin less than 100.  INTERIM HISTORY:  [name] comes in for followup.  We have been phlebotomizing him.  When we saw him back in December, his ferritin was 292.  His iron saturation was 28%.  He has been phlebotomized I think 2 or 3 times.  He has tolerated this well.  He did have an alpha fetoprotein level of 2.11 when we first saw him.  He has had no complaints.  He does have some stiffness on occasion.  He has had a good appetite.  He has had no change in bowel or bladder habits.  There have not had any problems with bleeding.  He has not noticed any swelling in his legs.  He does have diabetes.  He is watching his blood sugars fairly closely.  PHYSICAL EXAMINATION:  This is a well-developed, well-nourished white gentleman in no obvious distress.  Vital signs:  Temperature 97.3, pulse 63, respiratory rate 18, blood pressure 114/75.  Weight is 229.  Head and neck:  Shows a normocephalic, atraumatic skull.  There are no ocular or oral lesions.  There are no palpable cervical or supraclavicular lymph nodes.  Lungs:  Clear bilaterally.  Cardiac:  Regular rate and rhythm with a normal S1 and S2.  He has a systolic click.  There is a 2/6 systolic ejection murmur consistent with the aortic valve repair replacement.  Abdomen:  Soft with good bowel sounds.  There is no palpable abdominal mass.  There is no fluid wave.  There is no palpable hepatosplenomegaly.  Back:  No tenderness over the spine, ribs, or hips. Extremities:  Show no clubbing, cyanosis or edema.  Neurologic:  Shows no focal neurological deficits.  LABORATORY STUDIES:  White cell count is 9.6, hemoglobin 12.1, hematocrit 35.2, platelet count 208.  MCV is 91.  IMPRESSION:  [name] is a 66 year old gentleman with hemochromatosis. He is homozygous for the minor gene, H63D.  We will see what his ferritin is.  We will, again, maintain his ferritin level less than 100.  I will plan to get him back probably in about 2 or 3 month.  We will have to see how his ferritin is.    ______________________________ Josph Macho, M.D. PRE/MEDQ  D:  03/31/2011  T:  03/31/2011  Job:  [id number]  ADDENDUM: Ferritin is 103.  Will take off 1 pint of blood.

## 2011-04-04 ENCOUNTER — Other Ambulatory Visit: Payer: Self-pay | Admitting: Cardiology

## 2011-04-04 DIAGNOSIS — L738 Other specified follicular disorders: Secondary | ICD-10-CM | POA: Diagnosis not present

## 2011-04-04 DIAGNOSIS — L570 Actinic keratosis: Secondary | ICD-10-CM | POA: Diagnosis not present

## 2011-04-05 ENCOUNTER — Ambulatory Visit: Payer: Medicare Other | Admitting: Cardiology

## 2011-04-10 ENCOUNTER — Telehealth: Payer: Self-pay | Admitting: *Deleted

## 2011-04-10 NOTE — Telephone Encounter (Signed)
Called patient and left message that his iron level is a little above 100 and needs one more phlebotomy to schedule at his convenience.  Message forwarded to schedule in the case of a call back.

## 2011-04-10 NOTE — Telephone Encounter (Signed)
Message copied by Anselm Jungling on Mon Apr 10, 2011  1:44 PM ------      Message from: Arlan Organ R      Created: Sun Apr 02, 2011  8:12 PM       Call_ iron slighty above 100 - need 1 more phlebotomy.  Set up at anytime.Marland Kitchen  pete

## 2011-04-11 ENCOUNTER — Other Ambulatory Visit: Payer: Self-pay | Admitting: Cardiology

## 2011-04-11 ENCOUNTER — Telehealth: Payer: Self-pay | Admitting: Hematology & Oncology

## 2011-04-11 NOTE — Telephone Encounter (Signed)
Pt aware of 3-15 phlebotomy

## 2011-04-12 ENCOUNTER — Ambulatory Visit (INDEPENDENT_AMBULATORY_CARE_PROVIDER_SITE_OTHER): Payer: Medicare Other | Admitting: *Deleted

## 2011-04-12 DIAGNOSIS — Z954 Presence of other heart-valve replacement: Secondary | ICD-10-CM | POA: Diagnosis not present

## 2011-04-12 DIAGNOSIS — I359 Nonrheumatic aortic valve disorder, unspecified: Secondary | ICD-10-CM | POA: Diagnosis not present

## 2011-04-14 ENCOUNTER — Ambulatory Visit (HOSPITAL_BASED_OUTPATIENT_CLINIC_OR_DEPARTMENT_OTHER): Payer: Medicare Other

## 2011-04-14 NOTE — Progress Notes (Signed)
[name] presents today for phlebotomy per MD orders. Phlebotomy procedure started at  1500and ended at 1530 Approximately  removed. Patient observed for 30 minutes after procedure without any incident. Patient tolerated procedure well. IV needle removed intact. Teola Bradley, Mordche Hedglin [employer]

## 2011-04-14 NOTE — Patient Instructions (Signed)

## 2011-04-19 ENCOUNTER — Other Ambulatory Visit: Payer: Self-pay | Admitting: Family

## 2011-05-04 ENCOUNTER — Ambulatory Visit: Payer: Medicare Other | Admitting: Cardiology

## 2011-05-08 ENCOUNTER — Ambulatory Visit: Payer: Medicare Other | Admitting: Cardiology

## 2011-05-10 ENCOUNTER — Other Ambulatory Visit: Payer: Self-pay | Admitting: Family

## 2011-05-10 NOTE — Telephone Encounter (Signed)
Current refill request for benicar hct was for 1 tablet daily. Our records indicate pt is taking 1/2 tablet daily. Left message for pt to return my call and verify directions.

## 2011-05-11 ENCOUNTER — Ambulatory Visit (INDEPENDENT_AMBULATORY_CARE_PROVIDER_SITE_OTHER): Payer: Medicare Other | Admitting: Cardiology

## 2011-05-11 ENCOUNTER — Ambulatory Visit (INDEPENDENT_AMBULATORY_CARE_PROVIDER_SITE_OTHER): Payer: Medicare Other | Admitting: Pharmacist

## 2011-05-11 VITALS — BP 122/66 | HR 76 | Ht 70.0 in | Wt 231.0 lb

## 2011-05-11 DIAGNOSIS — R42 Dizziness and giddiness: Secondary | ICD-10-CM | POA: Diagnosis not present

## 2011-05-11 DIAGNOSIS — I2510 Atherosclerotic heart disease of native coronary artery without angina pectoris: Secondary | ICD-10-CM

## 2011-05-11 DIAGNOSIS — I421 Obstructive hypertrophic cardiomyopathy: Secondary | ICD-10-CM

## 2011-05-11 DIAGNOSIS — I359 Nonrheumatic aortic valve disorder, unspecified: Secondary | ICD-10-CM | POA: Diagnosis not present

## 2011-05-11 DIAGNOSIS — Z954 Presence of other heart-valve replacement: Secondary | ICD-10-CM

## 2011-05-11 DIAGNOSIS — E7800 Pure hypercholesterolemia, unspecified: Secondary | ICD-10-CM

## 2011-05-11 DIAGNOSIS — I10 Essential (primary) hypertension: Secondary | ICD-10-CM | POA: Diagnosis not present

## 2011-05-11 NOTE — Assessment & Plan Note (Signed)
Diagnosis comes from [place], but echo shows really on mild basal septal hypertrophy in the setting of AS

## 2011-05-11 NOTE — Progress Notes (Signed)
HPI:  Patient is doing well.  Discusses a few concerns.  When he raises head up from lying, he gets dizzy.  He denies any chest pain or shortness of breath.  He otherwise is stable.  He also is wonder that when he bends up he has a hernia over his gallbladder.  We also discussed his valve choice.    Current Outpatient Prescriptions  Medication Sig Dispense Refill  . atorvastatin (LIPITOR) 20 MG tablet TAKE ONE TABLET DAILY AT BEDTIME  30 tablet  2  . JANUVIA 100 MG tablet TAKE ONE (1) TABLET EACH DAY  30 tablet  2  . leuprolide (LUPRON) 30 MG injection Inject 30 mg into the muscle every 4 (four) months.        . metFORMIN (GLUCOPHAGE) 500 MG tablet Take 1  tablet twice a day.      . metoprolol succinate (TOPROL-XL) 25 MG 24 hr tablet TAKE ONE (1) TABLET EACH DAY  30 tablet  2  . olmesartan-hydrochlorothiazide (BENICAR HCT) 20-12.5 MG per tablet Take 0.5 tablets by mouth as directed.       . Omega-3 Fatty Acids (FISH OIL) 1000 MG CAPS Take 2 capsules by mouth 2 (two) times daily.        Marland Kitchen warfarin (COUMADIN) 5 MG tablet Take 5 mg by mouth as directed.      . warfarin (JANTOVEN) 5 MG tablet Take as directed by [facility]  45 tablet  3  . DISCONTD: metFORMIN (GLUCOPHAGE) 500 MG tablet Take 1 1/2 tablets twice a day.  90 tablet  2  . FREESTYLE LITE test strip CHECK SUGAR ONCE A DAY  50 each  2  . Lancets (FREESTYLE) lancets Use to check blood sugar once a day as directed.  100 each  1  . DISCONTD: atorvastatin (LIPITOR) 10 MG tablet Take 10 mg by mouth daily.      Marland Kitchen DISCONTD: metoprolol succinate (TOPROL-XL) 25 MG 24 hr tablet         No Known Allergies  Past Medical History  Diagnosis Date  . Allergy   . Hyperlipidemia   . Hypertension   . Cancer 2011    prostate  . Obesity     moderate  . Aortic stenosis   . CAD (coronary artery disease)     cabg  . History of gout   . Heart murmur   . History of hepatitis 1974  . Type II or unspecified type diabetes mellitus without  mention of complication, not stated as uncontrolled   . Hemorrhoids   . Sensorineural hearing loss   . Hypertrophic obstructive cardiomyopathy   . Erectile dysfunction   . Hemochromatosis 02/14/2011    Past Surgical History  Procedure Date  . Prostate seed implant 3/12  . Coronary artery bypass graft 10/2003  . Aortic valve replacement     [facility]  . Hip surgery 2006    right hip  . Cholecystectomy 1990  . Hernia repair 1999    right, inguinal  . Hernia repair 2002    left, inguinal  . Pilonidal cyst excision 1964  . Tonsillectomy childhood  . Appendectomy 1990    Family History  Problem Relation Age of Onset  . Heart disease Mother   . Stroke Mother   . Heart disease Father   . Coronary artery disease Brother   . Asperger's syndrome Son   . Hyperlipidemia Son   . Cancer Neg Hx     negative for colon  cancer    History   Social History  . Marital Status: Married    Spouse Name: N/A    Number of Children: N/A  . Years of Education: N/A   Occupational History  . Not on file.   Social History Main Topics  . Smoking status: Former Smoker    Types: Cigars  . Smokeless tobacco: Not on file  . Alcohol Use: Yes     4 glasses wine a month  . Drug Use: Not on file  . Sexually Active: Not on file   Other Topics Concern  . Not on file   Social History Narrative  . No narrative on file    ROS: Please see the HPI.  All other systems reviewed and negative.  PHYSICAL EXAM:  BP 122/66  Pulse 76  Ht 5\' 10"  (1.778 m)  Wt 231 lb (104.781 kg)  BMI 33.15 kg/m2  General: Well developed, well nourished, in no acute distress. Head:  Normocephalic and atraumatic. Neck: no JVD.  No carotid bruits.  No loss of RUE or LUE pulse with head manuevers.   Lungs: Clear to auscultation and percussion. Heart: Normal S1 and crisp VS.  No DM.  Abdomen:  Normal bowel sounds; soft; non tender; no organomegaly Pulses: Pulses normal in all 4 extremities. Extremities: No  clubbing or cyanosis. No edema. Neurologic: Alert and oriented x 3.  EKG:  NSR.  LAE.  No acute changes.    ASSESSMENT AND PLAN:

## 2011-05-11 NOTE — Assessment & Plan Note (Signed)
Stable no chest pain. 

## 2011-05-11 NOTE — Telephone Encounter (Signed)
Call placed to patient at [phone], He has verified that he is taking 1/2 tablet of the benicar hct.

## 2011-05-11 NOTE — Patient Instructions (Signed)
Your physician has requested that you have a carotid duplex. This test is an ultrasound of the carotid arteries in your neck. It looks at blood flow through these arteries that supply the brain with blood. Allow one hour for this exam. There are no restrictions or special instructions.  Your physician recommends that you continue on your current medications as directed. Please refer to the Current Medication list given to you today.  Your physician wants you to follow-up in: 6 MONTHS.  You will receive a reminder letter in the mail two months in advance. If you don't receive a letter, please call our office to schedule the follow-up appointment.

## 2011-05-11 NOTE — Assessment & Plan Note (Signed)
Crisp VS.  Remains on warfarin.  No bleeding problems.

## 2011-05-11 NOTE — Assessment & Plan Note (Signed)
Well controlled 

## 2011-05-11 NOTE — Assessment & Plan Note (Signed)
LDL near target, but HDL low and triglycerides are high.

## 2011-05-11 NOTE — Assessment & Plan Note (Signed)
Says it occurs when he is laying flat and raises head only.  No loss of pulse in either arm.  Hem ONC wanted carotids checked.  Will do.

## 2011-05-12 ENCOUNTER — Ambulatory Visit: Payer: Medicare Other | Admitting: Family

## 2011-05-13 ENCOUNTER — Encounter: Payer: Self-pay | Admitting: Cardiology

## 2011-05-13 NOTE — Assessment & Plan Note (Signed)
Currently being managed by Dr. Myna Hidalgo.

## 2011-05-19 ENCOUNTER — Telehealth: Payer: Self-pay | Admitting: *Deleted

## 2011-05-19 ENCOUNTER — Encounter: Payer: Self-pay | Admitting: Family

## 2011-05-19 ENCOUNTER — Ambulatory Visit (INDEPENDENT_AMBULATORY_CARE_PROVIDER_SITE_OTHER): Payer: Medicare Other | Admitting: Family

## 2011-05-19 VITALS — BP 100/70 | HR 70 | Temp 98.0°F | Resp 16 | Ht 70.0 in | Wt 234.1 lb

## 2011-05-19 DIAGNOSIS — Z23 Encounter for immunization: Secondary | ICD-10-CM

## 2011-05-19 DIAGNOSIS — E785 Hyperlipidemia, unspecified: Secondary | ICD-10-CM

## 2011-05-19 DIAGNOSIS — I10 Essential (primary) hypertension: Secondary | ICD-10-CM

## 2011-05-19 DIAGNOSIS — C61 Malignant neoplasm of prostate: Secondary | ICD-10-CM

## 2011-05-19 DIAGNOSIS — E119 Type 2 diabetes mellitus without complications: Secondary | ICD-10-CM

## 2011-05-19 NOTE — Progress Notes (Signed)
Subjective:    Patient ID: [name], male    DOB: Sep 26, 1945, 66 y.o.   MRN: [id number]  HPI  [name] is a 66 yr old male who presents today for follow up.  HTN-  Cutting benicar hct in half.  Notes that the pill is sometimes hard to cut.   DM2-  Sugars fasting around 140- on janumet.  Prostate Cancer-  PSA is down, he follows up this summer with Urology. Continues [name] injection.  Still some AM diarrhea.    Hemochromatosis- this is managed by Dr. Myna Montgomery.  Review of Systems See HPI  Past Medical History  Diagnosis Date  . Allergy   . Hyperlipidemia   . Hypertension   . Cancer 2011    prostate  . Obesity     moderate  . Aortic stenosis   . CAD (coronary artery disease)     cabg  . History of gout   . Heart murmur   . History of hepatitis 1974  . Type II or unspecified type diabetes mellitus without mention of complication, not stated as uncontrolled   . Hemorrhoids   . Sensorineural hearing loss   . Hypertrophic obstructive cardiomyopathy   . Erectile dysfunction   . Hemochromatosis 02/14/2011    History   Social History  . Marital Status: Married    Spouse Name: N/A    Number of Children: N/A  . Years of Education: N/A   Occupational History  . Not on file.   Social History Main Topics  . Smoking status: Former Smoker    Types: Cigars  . Smokeless tobacco: Not on file  . Alcohol Use: Yes     4 glasses wine a month  . Drug Use: Not on file  . Sexually Active: Not on file   Other Topics Concern  . Not on file   Social History Narrative  . No narrative on file    Past Surgical History  Procedure Date  . Prostate seed implant 3/12  . Coronary artery bypass graft 10/2003  . Aortic valve replacement     [facility]  . Hip surgery 2006    right hip  . Cholecystectomy 1990  . Hernia repair 1999    right, inguinal  . Hernia repair 2002    left, inguinal  . Pilonidal cyst excision 1964  . Tonsillectomy childhood  . Appendectomy  1990    Family History  Problem Relation Age of Onset  . Heart disease Mother   . Stroke Mother   . Heart disease Father   . Coronary artery disease Brother   . Asperger's syndrome Son   . Hyperlipidemia Son   . Cancer Neg Hx     negative for colon cancer    No Known Allergies  Current Outpatient Prescriptions on File Prior to Visit  Medication Sig Dispense Refill  . atorvastatin (LIPITOR) 20 MG tablet TAKE ONE TABLET DAILY AT BEDTIME  30 tablet  2  . FREESTYLE LITE test strip CHECK SUGAR ONCE A DAY  50 each  2  . JANUVIA 100 MG tablet TAKE ONE (1) TABLET EACH DAY  30 tablet  2  . Lancets (FREESTYLE) lancets Use to check blood sugar once a day as directed.  100 each  1  . leuprolide ([name]) 30 MG injection Inject 30 mg into the muscle every 4 (four) months.        . metFORMIN (GLUCOPHAGE) 500 MG tablet Take 1  tablet twice a day.      Marland Kitchen  metoprolol succinate (TOPROL-XL) 25 MG 24 hr tablet TAKE ONE (1) TABLET EACH DAY  30 tablet  2  . olmesartan-hydrochlorothiazide (BENICAR HCT) 20-12.5 MG per tablet Take 0.5 tablets by mouth daily.  30 tablet  2  . Omega-3 Fatty Acids (FISH OIL) 1000 MG CAPS Take 2 capsules by mouth 2 (two) times daily.        Marland Kitchen warfarin (JANTOVEN) 5 MG tablet Take as directed by [facility]  45 tablet  3    BP 100/70  Pulse 70  Temp(Src) 98 F (36.7 C) (Oral)  Resp 16  Ht 5\' 10"  (1.778 m)  Wt 234 lb 1.9 oz (106.196 kg)  BMI 33.59 kg/m2  SpO2 98%       Objective:   Physical Exam  Constitutional: He is oriented to person, place, and time. He appears well-developed and well-nourished. No distress.  Cardiovascular: Normal rate and regular rhythm.        Mechanical click noted.   Pulmonary/Chest: Effort normal and breath sounds normal. No respiratory distress. He has no wheezes. He has no rales. He exhibits no tenderness.  Musculoskeletal: He exhibits no edema.  Neurological: He is alert and oriented to person, place, and time.  Psychiatric: He has  a normal mood and affect. His behavior is normal. Judgment and thought content normal.          Assessment & Plan:

## 2011-05-19 NOTE — Patient Instructions (Addendum)
Please complete lab work prior to leaving today. Follow up in 3 months.  Come to lab 1 week prior to this appointment for lab work- you do not need to fast.

## 2011-05-19 NOTE — Telephone Encounter (Signed)
Message copied by Kathi Simpers on Fri May 19, 2011  2:20 PM ------      Message from: O'SULLIVAN, [name]      Created: Fri May 19, 2011 10:36 AM       Pls send lab order for 3 months            BMET/A1C (250.00)

## 2011-05-19 NOTE — Telephone Encounter (Signed)
Future lab order entered for 08/18/11 and forwarded to the lab. Copy mailed to pt as reminder.

## 2011-05-20 LAB — HEPATIC FUNCTION PANEL
AST: 51 U/L — ABNORMAL HIGH (ref 0–37)
Alkaline Phosphatase: 65 U/L (ref 39–117)
Bilirubin, Direct: 0.1 mg/dL (ref 0.0–0.3)
Indirect Bilirubin: 0.3 mg/dL (ref 0.0–0.9)
Total Bilirubin: 0.4 mg/dL (ref 0.3–1.2)

## 2011-05-20 LAB — BASIC METABOLIC PANEL WITH GFR
BUN: 22 mg/dL (ref 6–23)
CO2: 25 mEq/L (ref 19–32)
Calcium: 9.1 mg/dL (ref 8.4–10.5)
Glucose, Bld: 94 mg/dL (ref 70–99)
Sodium: 139 mEq/L (ref 135–145)

## 2011-05-22 ENCOUNTER — Encounter: Payer: Self-pay | Admitting: Family

## 2011-05-22 NOTE — Assessment & Plan Note (Signed)
Continues to have some diarrhea s/p radiation, but feels that this is manageable with prn immodium.

## 2011-05-22 NOTE — Assessment & Plan Note (Signed)
Defer management to hematology.  

## 2011-05-22 NOTE — Assessment & Plan Note (Signed)
Pneumovax today. A1C at goal on januvia and metformin.

## 2011-05-22 NOTE — Assessment & Plan Note (Signed)
We discussed switching benicar hct. into its components which might be easier to cut.  He wishes to continue with 1/2 of benicar hct.

## 2011-05-26 ENCOUNTER — Encounter: Payer: Self-pay | Admitting: Family

## 2011-06-09 ENCOUNTER — Encounter (INDEPENDENT_AMBULATORY_CARE_PROVIDER_SITE_OTHER): Payer: Medicare Other

## 2011-06-09 ENCOUNTER — Ambulatory Visit (INDEPENDENT_AMBULATORY_CARE_PROVIDER_SITE_OTHER): Payer: Medicare Other | Admitting: *Deleted

## 2011-06-09 DIAGNOSIS — Z954 Presence of other heart-valve replacement: Secondary | ICD-10-CM

## 2011-06-09 DIAGNOSIS — I359 Nonrheumatic aortic valve disorder, unspecified: Secondary | ICD-10-CM

## 2011-06-09 DIAGNOSIS — I2510 Atherosclerotic heart disease of native coronary artery without angina pectoris: Secondary | ICD-10-CM

## 2011-06-09 DIAGNOSIS — R42 Dizziness and giddiness: Secondary | ICD-10-CM | POA: Diagnosis not present

## 2011-06-16 ENCOUNTER — Telehealth: Payer: Self-pay | Admitting: Cardiology

## 2011-06-16 NOTE — Telephone Encounter (Signed)
I left a detailed message on the pt's answering machine with carotid results.

## 2011-06-16 NOTE — Telephone Encounter (Signed)
New Problem:    Patient called in returning [name]'s call about his Carotid results.  Please call back and feel free to leave a message or place a message on his MyChart Msg.

## 2011-06-30 ENCOUNTER — Other Ambulatory Visit: Payer: Self-pay | Admitting: Family

## 2011-07-06 ENCOUNTER — Ambulatory Visit (INDEPENDENT_AMBULATORY_CARE_PROVIDER_SITE_OTHER): Payer: Medicare Other | Admitting: Pharmacist

## 2011-07-06 DIAGNOSIS — I359 Nonrheumatic aortic valve disorder, unspecified: Secondary | ICD-10-CM | POA: Diagnosis not present

## 2011-07-06 DIAGNOSIS — Z954 Presence of other heart-valve replacement: Secondary | ICD-10-CM

## 2011-07-06 LAB — POCT INR: INR: 2.2

## 2011-07-07 DIAGNOSIS — C61 Malignant neoplasm of prostate: Secondary | ICD-10-CM | POA: Diagnosis not present

## 2011-07-12 ENCOUNTER — Other Ambulatory Visit: Payer: Self-pay | Admitting: *Deleted

## 2011-07-12 MED ORDER — WARFARIN SODIUM 5 MG PO TABS: ORAL_TABLET | ORAL | Status: DC

## 2011-08-10 ENCOUNTER — Ambulatory Visit (INDEPENDENT_AMBULATORY_CARE_PROVIDER_SITE_OTHER): Payer: Medicare Other | Admitting: *Deleted

## 2011-08-10 DIAGNOSIS — Z954 Presence of other heart-valve replacement: Secondary | ICD-10-CM | POA: Diagnosis not present

## 2011-08-10 DIAGNOSIS — I359 Nonrheumatic aortic valve disorder, unspecified: Secondary | ICD-10-CM | POA: Diagnosis not present

## 2011-08-10 LAB — POCT INR: INR: 2.7

## 2011-08-11 DIAGNOSIS — E119 Type 2 diabetes mellitus without complications: Secondary | ICD-10-CM | POA: Diagnosis not present

## 2011-08-11 LAB — BASIC METABOLIC PANEL
BUN: 18 mg/dL (ref 6–23)
Chloride: 102 mEq/L (ref 96–112)
Potassium: 4.7 mEq/L (ref 3.5–5.3)

## 2011-08-11 NOTE — Addendum Note (Signed)
Addended by: Mervin Kung A on: 08/11/2011 03:58 PM   Modules accepted: Orders

## 2011-08-11 NOTE — Telephone Encounter (Signed)
Pt presented to the lab, future orders released. 

## 2011-08-12 LAB — HEMOGLOBIN A1C: Mean Plasma Glucose: 166 mg/dL — ABNORMAL HIGH (ref <117)

## 2011-08-16 ENCOUNTER — Ambulatory Visit (INDEPENDENT_AMBULATORY_CARE_PROVIDER_SITE_OTHER): Payer: Medicare Other | Admitting: Family

## 2011-08-16 ENCOUNTER — Encounter: Payer: Self-pay | Admitting: Family

## 2011-08-16 VITALS — BP 100/70 | HR 61 | Temp 98.1°F | Resp 16 | Ht 70.0 in | Wt 233.0 lb

## 2011-08-16 DIAGNOSIS — I10 Essential (primary) hypertension: Secondary | ICD-10-CM

## 2011-08-16 DIAGNOSIS — E119 Type 2 diabetes mellitus without complications: Secondary | ICD-10-CM | POA: Diagnosis not present

## 2011-08-16 DIAGNOSIS — C61 Malignant neoplasm of prostate: Secondary | ICD-10-CM | POA: Diagnosis not present

## 2011-08-16 MED ORDER — METFORMIN HCL 500 MG PO TABS: 1000.0000 mg | ORAL_TABLET | Freq: Two times a day (BID) | ORAL | Status: DC

## 2011-08-16 NOTE — Assessment & Plan Note (Signed)
Management per Hematology.  

## 2011-08-16 NOTE — Progress Notes (Signed)
Subjective:    Patient ID: [name], male    DOB: 1946-01-13, 66 y.o.   MRN: [id number]  HPI  Mr. Scahill is a 66 yr old male who presents today for follow up.  1) HTN- mild LE edema.  Tolerating meds.    2) DM2- Not exercising.Had eye exam in February.    4) prostate ca- Dahlstedt- continuing lupron.  Last injection June.  Denies dysuria.   5) Hemochromatosis- Reports fatigue since he underwent phlebotomy with Hematology (Dr. Myna Hidalgo).    Review of Systems See HPI  Past Medical History  Diagnosis Date  . Allergy   . Hyperlipidemia   . Hypertension   . Cancer 2011    prostate  . Obesity     moderate  . Aortic stenosis   . CAD (coronary artery disease)     cabg  . History of gout   . Heart murmur   . History of hepatitis 1974  . Type II or unspecified type diabetes mellitus without mention of complication, not stated as uncontrolled   . Hemorrhoids   . Sensorineural hearing loss   . Hypertrophic obstructive cardiomyopathy   . Erectile dysfunction   . Hemochromatosis 02/14/2011    History   Social History  . Marital Status: Married    Spouse Name: N/A    Number of Children: N/A  . Years of Education: N/A   Occupational History  . Not on file.   Social History Main Topics  . Smoking status: Former Smoker    Types: Cigars  . Smokeless tobacco: Not on file  . Alcohol Use: Yes     4 glasses wine a month  . Drug Use: Not on file  . Sexually Active: Not on file   Other Topics Concern  . Not on file   Social History Narrative  . No narrative on file    Past Surgical History  Procedure Date  . Prostate seed implant 3/12  . Coronary artery bypass graft 10/2003  . Aortic valve replacement     [facility]  . Hip surgery 2006    right hip  . Cholecystectomy 1990  . Hernia repair 1999    right, inguinal  . Hernia repair 2002    left, inguinal  . Pilonidal cyst excision 1964  . Tonsillectomy childhood  . Appendectomy 1990    Family  History  Problem Relation Age of Onset  . Heart disease Mother   . Stroke Mother   . Heart disease Father   . Coronary artery disease Brother   . Asperger's syndrome Son   . Hyperlipidemia Son   . Cancer Neg Hx     negative for colon cancer    No Known Allergies  Current Outpatient Prescriptions on File Prior to Visit  Medication Sig Dispense Refill  . atorvastatin (LIPITOR) 20 MG tablet TAKE ONE TABLET DAILY AT BEDTIME  30 tablet  2  . FREESTYLE LITE test strip CHECK SUGAR ONCE A DAY  50 each  2  . JANUVIA 100 MG tablet TAKE ONE (1) TABLET EACH DAY  30 tablet  2  . Lancets (FREESTYLE) lancets Use to check blood sugar once a day as directed.  100 each  1  . metoprolol succinate (TOPROL-XL) 25 MG 24 hr tablet TAKE ONE (1) TABLET EACH DAY  30 tablet  2  . olmesartan-hydrochlorothiazide (BENICAR HCT) 20-12.5 MG per tablet Take 0.5 tablets by mouth daily.  30 tablet  2  . Omega-3 Fatty Acids (  FISH OIL) 1000 MG CAPS Take 2 capsules by mouth 2 (two) times daily.        Marland Kitchen warfarin (JANTOVEN) 5 MG tablet Take as directed by [facility]  45 tablet  3  . DISCONTD: metFORMIN (GLUCOPHAGE) 500 MG tablet Take 1,000 mg by mouth. Take 1  tablet twice a day.      Marland Kitchen leuprolide (LUPRON) 30 MG injection Inject 30 mg into the muscle every 4 (four) months.          BP 100/70  Pulse 61  Temp 98.1 F (36.7 C) (Oral)  Resp 16  Ht 5\' 10"  (1.778 m)  Wt 233 lb (105.688 kg)  BMI 33.43 kg/m2  SpO2 97%       Objective:   Physical Exam  Constitutional: He appears well-developed and well-nourished.  Cardiovascular: Normal rate and regular rhythm.   No murmur heard.      + click due to AVR  Pulmonary/Chest: Effort normal and breath sounds normal. No respiratory distress. He has no wheezes. He has no rales. He exhibits no tenderness.  Musculoskeletal:       1+ bilateral LE edema  Psychiatric: He has a normal mood and affect. His behavior is normal. Judgment and thought content normal.           Assessment & Plan:

## 2011-08-16 NOTE — Assessment & Plan Note (Signed)
BP is stable on benicar and toprol.

## 2011-08-16 NOTE — Assessment & Plan Note (Signed)
Clinically stable. Management per Urology.

## 2011-08-16 NOTE — Patient Instructions (Addendum)
Please schedule a follow up appointment in 3 months.

## 2011-08-16 NOTE — Assessment & Plan Note (Signed)
A1C is 7.4,  Continue januvia, increase metformin from 500mg  bid to 1000mg  bid.

## 2011-09-01 ENCOUNTER — Other Ambulatory Visit: Payer: Self-pay | Admitting: *Deleted

## 2011-09-01 MED ORDER — ATORVASTATIN CALCIUM 20 MG PO TABS: 20.0000 mg | ORAL_TABLET | Freq: Every day | ORAL | Status: DC

## 2011-09-07 ENCOUNTER — Ambulatory Visit (INDEPENDENT_AMBULATORY_CARE_PROVIDER_SITE_OTHER): Payer: Medicare Other | Admitting: *Deleted

## 2011-09-07 DIAGNOSIS — Z954 Presence of other heart-valve replacement: Secondary | ICD-10-CM | POA: Diagnosis not present

## 2011-09-07 DIAGNOSIS — I359 Nonrheumatic aortic valve disorder, unspecified: Secondary | ICD-10-CM | POA: Diagnosis not present

## 2011-09-07 LAB — POCT INR: INR: 2

## 2011-09-12 ENCOUNTER — Other Ambulatory Visit: Payer: Self-pay | Admitting: Family

## 2011-10-06 ENCOUNTER — Ambulatory Visit (INDEPENDENT_AMBULATORY_CARE_PROVIDER_SITE_OTHER): Payer: Medicare Other

## 2011-10-06 DIAGNOSIS — Z954 Presence of other heart-valve replacement: Secondary | ICD-10-CM

## 2011-10-06 DIAGNOSIS — I359 Nonrheumatic aortic valve disorder, unspecified: Secondary | ICD-10-CM

## 2011-11-02 ENCOUNTER — Ambulatory Visit (INDEPENDENT_AMBULATORY_CARE_PROVIDER_SITE_OTHER): Payer: Medicare Other | Admitting: *Deleted

## 2011-11-02 DIAGNOSIS — I359 Nonrheumatic aortic valve disorder, unspecified: Secondary | ICD-10-CM | POA: Diagnosis not present

## 2011-11-02 DIAGNOSIS — Z954 Presence of other heart-valve replacement: Secondary | ICD-10-CM | POA: Diagnosis not present

## 2011-11-02 LAB — POCT INR: INR: 3

## 2011-11-08 DIAGNOSIS — E291 Testicular hypofunction: Secondary | ICD-10-CM | POA: Diagnosis not present

## 2011-11-08 DIAGNOSIS — C61 Malignant neoplasm of prostate: Secondary | ICD-10-CM | POA: Diagnosis not present

## 2011-11-09 DIAGNOSIS — C61 Malignant neoplasm of prostate: Secondary | ICD-10-CM | POA: Diagnosis not present

## 2011-11-13 ENCOUNTER — Other Ambulatory Visit: Payer: Self-pay | Admitting: Family

## 2011-11-15 ENCOUNTER — Ambulatory Visit: Payer: Medicare Other | Admitting: Family

## 2011-11-21 ENCOUNTER — Ambulatory Visit (INDEPENDENT_AMBULATORY_CARE_PROVIDER_SITE_OTHER): Payer: Medicare Other | Admitting: Family

## 2011-11-21 ENCOUNTER — Encounter: Payer: Self-pay | Admitting: Family

## 2011-11-21 VITALS — BP 122/76 | HR 69 | Temp 98.1°F | Resp 16 | Ht 70.0 in | Wt 231.0 lb

## 2011-11-21 DIAGNOSIS — E119 Type 2 diabetes mellitus without complications: Secondary | ICD-10-CM

## 2011-11-21 DIAGNOSIS — I10 Essential (primary) hypertension: Secondary | ICD-10-CM

## 2011-11-21 DIAGNOSIS — Z23 Encounter for immunization: Secondary | ICD-10-CM | POA: Diagnosis not present

## 2011-11-21 DIAGNOSIS — E785 Hyperlipidemia, unspecified: Secondary | ICD-10-CM

## 2011-11-21 LAB — BASIC METABOLIC PANEL
CO2: 27 mEq/L (ref 19–32)
Calcium: 9.5 mg/dL (ref 8.4–10.5)
Creat: 1.29 mg/dL (ref 0.50–1.35)
Glucose, Bld: 117 mg/dL — ABNORMAL HIGH (ref 70–99)

## 2011-11-21 LAB — HEMOGLOBIN A1C: Mean Plasma Glucose: 171 mg/dL — ABNORMAL HIGH (ref <117)

## 2011-11-21 LAB — HEPATIC FUNCTION PANEL
ALT: 41 U/L (ref 0–53)
Albumin: 4.7 g/dL (ref 3.5–5.2)
Indirect Bilirubin: 0.4 mg/dL (ref 0.0–0.9)
Total Protein: 7.6 g/dL (ref 6.0–8.3)

## 2011-11-21 NOTE — Assessment & Plan Note (Signed)
He has not been back to see Dr. Myna Hidalgo since March- I advised him to arrange a follow up visit.

## 2011-11-21 NOTE — Assessment & Plan Note (Signed)
BP is stable.  Continue current meds. Obtain bmet.  

## 2011-11-21 NOTE — Progress Notes (Signed)
Subjective:    Patient ID: [name], male    DOB: 03-16-1945, 66 y.o.   MRN: [id number]  HPI  [name] is a 66 yr old male who presents today for follow up.  HTN- has been taking 1/2 tab of his metoprolol and 1/2 tab of Benicar HCT. He notes improvement in his LE edema.  DM2- reports that he has not been checking his sugars regularly.  Wants flu shot today. Recently started exercising- trying to lose weight.  Chronic diarrhea- reports that this has improved. He is better able to get out in the morning.  Keeps imodium on hand for prn use.   Review of Systems    see HPI  Past Medical History  Diagnosis Date  . Allergy   . Hyperlipidemia   . Hypertension   . Cancer 2011    prostate  . Obesity     moderate  . Aortic stenosis   . CAD (coronary artery disease)     cabg  . History of gout   . Heart murmur   . History of hepatitis 1974  . Type II or unspecified type diabetes mellitus without mention of complication, not stated as uncontrolled   . Hemorrhoids   . Sensorineural hearing loss   . Hypertrophic obstructive cardiomyopathy   . Erectile dysfunction   . Hemochromatosis 02/14/2011    History   Social History  . Marital Status: Married    Spouse Name: N/A    Number of Children: N/A  . Years of Education: N/A   Occupational History  . Not on file.   Social History Main Topics  . Smoking status: Former Smoker    Types: Cigars  . Smokeless tobacco: Not on file  . Alcohol Use: Yes     4 glasses wine a month  . Drug Use: Not on file  . Sexually Active: Not on file   Other Topics Concern  . Not on file   Social History Narrative  . No narrative on file    Past Surgical History  Procedure Date  . Prostate seed implant 3/12  . Coronary artery bypass graft 10/2003  . Aortic valve replacement     [facility]  . Hip surgery 2006    right hip  . Cholecystectomy 1990  . Hernia repair 1999    right, inguinal  . Hernia repair 2002    left,  inguinal  . Pilonidal cyst excision 1964  . Tonsillectomy childhood  . Appendectomy 1990    Family History  Problem Relation Age of Onset  . Heart disease Mother   . Stroke Mother   . Heart disease Father   . Coronary artery disease Brother   . Asperger's syndrome Son   . Hyperlipidemia Son   . Cancer Neg Hx     negative for colon cancer    No Known Allergies  Current Outpatient Prescriptions on File Prior to Visit  Medication Sig Dispense Refill  . atorvastatin (LIPITOR) 20 MG tablet Take 1 tablet (20 mg total) by mouth daily.  30 tablet  9  . FREESTYLE LITE test strip CHECK SUGAR ONCE A DAY  50 each  2  . JANUVIA 100 MG tablet TAKE ONE (1) TABLET EACH DAY  30 tablet  2  . Lancets (FREESTYLE) lancets Use to check blood sugar once a day as directed.  100 each  1  . metFORMIN (GLUCOPHAGE) 500 MG tablet Take 2 tablets twice a day  120 tablet  2  .  olmesartan-hydrochlorothiazide (BENICAR HCT) 20-12.5 MG per tablet Take 0.5 tablets by mouth daily.  30 tablet  2  . Omega-3 Fatty Acids (FISH OIL) 1000 MG CAPS Take 2 capsules by mouth 2 (two) times daily.        Marland Kitchen warfarin (JANTOVEN) 5 MG tablet Take as directed by [facility]  45 tablet  3  . DISCONTD: metoprolol succinate (TOPROL-XL) 25 MG 24 hr tablet TAKE ONE (1) TABLET EACH DAY  30 tablet  1  . DISCONTD: metFORMIN (GLUCOPHAGE) 500 MG tablet Take 1,000 mg by mouth 2 (two) times daily with a meal.        BP 122/76  Pulse 69  Temp 98.1 F (36.7 C) (Oral)  Resp 16  Ht 5\' 10"  (1.778 m)  Wt 231 lb (104.781 kg)  BMI 33.15 kg/m2  SpO2 96%    Objective:   Physical Exam  Constitutional: He is oriented to person, place, and time. He appears well-developed and well-nourished. No distress.  Cardiovascular: Normal rate and regular rhythm.   No murmur heard. Pulmonary/Chest: Effort normal and breath sounds normal. No respiratory distress. He has no wheezes. He has no rales. He exhibits no tenderness.  Musculoskeletal: He  exhibits no edema.  Neurological: He is alert and oriented to person, place, and time.  Psychiatric: He has a normal mood and affect. His behavior is normal. Judgment and thought content normal.          Assessment & Plan:

## 2011-11-21 NOTE — Assessment & Plan Note (Signed)
Obtain A1C, flu shot today.  

## 2011-11-21 NOTE — Patient Instructions (Addendum)
Please complete blood work prior to leaving. Follow up in 3 months.

## 2011-11-22 ENCOUNTER — Other Ambulatory Visit: Payer: Self-pay | Admitting: *Deleted

## 2011-11-23 ENCOUNTER — Other Ambulatory Visit: Payer: Self-pay | Admitting: Medical

## 2011-11-23 NOTE — Progress Notes (Signed)
Pt stopped by yesterday after seeing Sandford Craze, NP to make an appt to have his "iron checked". Per Dr Myna Hidalgo last note in March he was due to return in 2 months. He didn't return. Sent a request to the schedulers to have his Ferritin checked this week to determine if he needs a phlebotomy or not. Will also have an appt made to see Dr Myna Hidalgo ASAP.

## 2011-11-24 ENCOUNTER — Other Ambulatory Visit (HOSPITAL_BASED_OUTPATIENT_CLINIC_OR_DEPARTMENT_OTHER): Payer: Medicare Other | Admitting: Lab

## 2011-11-24 LAB — CBC WITH DIFFERENTIAL (CANCER CENTER ONLY)
EOS%: 3.5 % (ref 0.0–7.0)
LYMPH#: 1.2 10*3/uL (ref 0.9–3.3)
MCH: 31.7 pg (ref 28.0–33.4)
MCHC: 35.7 g/dL (ref 32.0–35.9)
MONO%: 6.1 % (ref 0.0–13.0)
NEUT#: 5.9 10*3/uL (ref 1.5–6.5)
Platelets: 167 10*3/uL (ref 145–400)
RBC: 4.26 10*6/uL (ref 4.20–5.70)

## 2011-11-24 LAB — FERRITIN: Ferritin: 74 ng/mL (ref 22–322)

## 2011-11-27 ENCOUNTER — Telehealth: Payer: Self-pay | Admitting: Family

## 2011-11-27 NOTE — Telephone Encounter (Signed)
Please advise 

## 2011-11-27 NOTE — Telephone Encounter (Signed)
Patient is requesting last lab results 

## 2011-11-28 NOTE — Telephone Encounter (Signed)
Please see attached result note to labs.

## 2011-11-28 NOTE — Telephone Encounter (Signed)
Pt was notified on 11/27/11.

## 2011-11-29 ENCOUNTER — Other Ambulatory Visit: Payer: Self-pay | Admitting: Family

## 2011-11-29 NOTE — Telephone Encounter (Signed)
Rx to pharmacy/[facility] 

## 2011-11-30 ENCOUNTER — Ambulatory Visit (INDEPENDENT_AMBULATORY_CARE_PROVIDER_SITE_OTHER): Payer: Medicare Other | Admitting: *Deleted

## 2011-11-30 DIAGNOSIS — Z954 Presence of other heart-valve replacement: Secondary | ICD-10-CM

## 2011-11-30 DIAGNOSIS — I359 Nonrheumatic aortic valve disorder, unspecified: Secondary | ICD-10-CM

## 2011-12-13 ENCOUNTER — Other Ambulatory Visit (HOSPITAL_BASED_OUTPATIENT_CLINIC_OR_DEPARTMENT_OTHER): Payer: Medicare Other | Admitting: Lab

## 2011-12-13 ENCOUNTER — Ambulatory Visit (HOSPITAL_BASED_OUTPATIENT_CLINIC_OR_DEPARTMENT_OTHER): Payer: Medicare Other | Admitting: Hematology & Oncology

## 2011-12-13 DIAGNOSIS — Z954 Presence of other heart-valve replacement: Secondary | ICD-10-CM

## 2011-12-13 DIAGNOSIS — E119 Type 2 diabetes mellitus without complications: Secondary | ICD-10-CM | POA: Diagnosis not present

## 2011-12-13 LAB — CBC WITH DIFFERENTIAL (CANCER CENTER ONLY)
BASO%: 0.4 % (ref 0.0–2.0)
EOS%: 3.1 % (ref 0.0–7.0)
LYMPH%: 19.1 % (ref 14.0–48.0)
MCH: 32.3 pg (ref 28.0–33.4)
MCV: 89 fL (ref 82–98)
MONO%: 7.5 % (ref 0.0–13.0)
Platelets: 162 10*3/uL (ref 145–400)
RDW: 12.9 % (ref 11.1–15.7)

## 2011-12-13 LAB — IRON AND TIBC: Iron: 85 ug/dL (ref 42–165)

## 2011-12-13 NOTE — Progress Notes (Signed)
This office note has been dictated.

## 2011-12-14 ENCOUNTER — Telehealth: Payer: Self-pay | Admitting: Hematology & Oncology

## 2011-12-14 NOTE — Telephone Encounter (Signed)
Mailed February and May schedules to pt

## 2011-12-14 NOTE — Progress Notes (Signed)
CC:   Sandford Craze, NP Arturo Morton. Riley Kill, MD, [facility] Bertram Millard. Dahlstedt, M.D.  DIAGNOSES: 1. Hemochromatosis (H63D homozygous mutation). 2. Stage I prostate cancer.  CURRENT THERAPY:  Phlebotomy to maintain ferritin less than 100.  INTERIM HISTORY:  Mr. Hittner comes in for followup.  We last saw him back in March.  I am not sure why he had not come in since then.  I think when we last saw him in March, his ferritin was 103.  He was phlebotomized.  On October 25th, his ferritin was 74.  He is doing okay.  He has really had no specific issues.  He has had some arthritic problems.  He does have multiple medical issues.  He does have the diabetes which he is trying to watch for.  He is also on Coumadin for his prosthetic valve.  There has been no bleeding because of the Coumadin.  He has had no change in bowel or bladder habits.  He has had no leg swelling.  He has had no cough or shortness breath.  PHYSICAL EXAMINATION:  General:  This is a well-developed, well- nourished white gentleman in no obvious distress.  Vital Signs:  97.2, pulse 66, respiratory rate 18, blood pressure 152/72.  Weight is 232. Head and neck:  Normocephalic, atraumatic skull.  There are no ocular or oral lesions.  There are no palpable cervical or supraclavicular lymph nodes.  Lungs:  Clear bilaterally.  Cardiac:  Regular rate and rhythm with a normal S1 and S2.  He has a systolic click from his mechanical heart valve.  Abdomen:  Soft with good bowel sounds.  There is no palpable abdominal mass.  There is no palpable hepatosplenomegaly. Back:  No tenderness over the spine, ribs, or hips.  Extremities:  Some trace edema in his legs.  LABORATORY STUDIES:  White cell count is 10.7, hemoglobin 12.9, hematocrit 35.5, platelet count 162.  IMPRESSION:  Mr. Schilling is a 66 year old gentleman with hemochromatosis. He is homozygous for the H63D mutation.  He had his ferritin just a few weeks ago.  This was  only 74.  I would be surprised if his ferritin is higher than 100 right now.  I think the easiest thing that we can do is just have him come back every 3 months just for lab work.  I will try to see him back in 6 months.  With his family, he has a hard time coming to the office for an appointment, so I am going to try to make this as easy as possible for him.    ______________________________ Josph Macho, M.D. PRE/MEDQ  D:  12/13/2011  T:  12/14/2011  Job:  [id number]

## 2011-12-22 ENCOUNTER — Other Ambulatory Visit: Payer: Self-pay | Admitting: *Deleted

## 2011-12-22 MED ORDER — WARFARIN SODIUM 5 MG PO TABS: ORAL_TABLET | ORAL | Status: DC

## 2011-12-27 ENCOUNTER — Telehealth: Payer: Self-pay | Admitting: Hematology & Oncology

## 2011-12-27 NOTE — Telephone Encounter (Addendum)
Message copied by Cathi Roan on Wed Dec 27, 2011  1:43 PM ------      Message from: [place], [place] N      Created: Fri Dec 15, 2011 11:17 AM                   ----- Message -----         From: Josph Macho, MD         Sent: 12/14/2011   8:44 PM           To: [facility]            Call - iron is still ok.  You do not need a phlebotomy.  Cindee Lame  12-27-11  13:40    Called pt on home phone and left message regarding above MD note, advised if any questions to call the office. Massie Bougie LPN

## 2012-01-11 ENCOUNTER — Other Ambulatory Visit: Payer: Self-pay | Admitting: Family

## 2012-01-15 ENCOUNTER — Ambulatory Visit (INDEPENDENT_AMBULATORY_CARE_PROVIDER_SITE_OTHER): Payer: Medicare Other | Admitting: *Deleted

## 2012-01-15 DIAGNOSIS — Z954 Presence of other heart-valve replacement: Secondary | ICD-10-CM | POA: Diagnosis not present

## 2012-01-15 DIAGNOSIS — I359 Nonrheumatic aortic valve disorder, unspecified: Secondary | ICD-10-CM | POA: Diagnosis not present

## 2012-02-14 ENCOUNTER — Ambulatory Visit (INDEPENDENT_AMBULATORY_CARE_PROVIDER_SITE_OTHER): Payer: Medicare Other | Admitting: *Deleted

## 2012-02-14 DIAGNOSIS — Z954 Presence of other heart-valve replacement: Secondary | ICD-10-CM

## 2012-02-14 DIAGNOSIS — I359 Nonrheumatic aortic valve disorder, unspecified: Secondary | ICD-10-CM

## 2012-02-14 LAB — POCT INR: INR: 2.3

## 2012-02-21 ENCOUNTER — Ambulatory Visit (INDEPENDENT_AMBULATORY_CARE_PROVIDER_SITE_OTHER): Payer: Medicare Other | Admitting: Family

## 2012-02-21 ENCOUNTER — Encounter: Payer: Self-pay | Admitting: Family

## 2012-02-21 VITALS — BP 122/74 | HR 75 | Temp 98.5°F | Resp 16 | Ht 70.0 in | Wt 234.1 lb

## 2012-02-21 DIAGNOSIS — G4710 Hypersomnia, unspecified: Secondary | ICD-10-CM

## 2012-02-21 DIAGNOSIS — R400 Somnolence: Secondary | ICD-10-CM

## 2012-02-21 DIAGNOSIS — I10 Essential (primary) hypertension: Secondary | ICD-10-CM

## 2012-02-21 DIAGNOSIS — E119 Type 2 diabetes mellitus without complications: Secondary | ICD-10-CM | POA: Diagnosis not present

## 2012-02-21 LAB — BASIC METABOLIC PANEL
CO2: 27 mEq/L (ref 19–32)
Chloride: 102 mEq/L (ref 96–112)
Potassium: 4.3 mEq/L (ref 3.5–5.3)
Sodium: 138 mEq/L (ref 135–145)

## 2012-02-21 LAB — HEMOGLOBIN A1C: Mean Plasma Glucose: 183 mg/dL — ABNORMAL HIGH (ref <117)

## 2012-02-21 MED ORDER — METOPROLOL SUCCINATE ER 25 MG PO TB24: 12.5000 mg | ORAL_TABLET | Freq: Every day | ORAL | Status: DC

## 2012-02-21 NOTE — Patient Instructions (Addendum)
You will be contact about your referral for home sleep study.  Please let us know if you have not heard back within 1 week about your referral. Complete your blood work prior to leaving.  Please schedule a follow up appointment in 3 months.

## 2012-02-21 NOTE — Progress Notes (Signed)
Subjective:    Patient ID: [name], male    DOB: 10/28/1945, 67 y.o.   MRN: [id number]  HPI  [name] is a 67 yr old male who presents today for follow up.  1) DM2- Continues metformin and Januvia.  Reports some exercise.  Not checking sugars.    2) HTN-on toprol xl.  Denies CP or shortness of breath.   3) Fatigue- reports that he wakes up tired.  Often naps in the afternoons.    Review of Systems See HPI  Past Medical History  Diagnosis Date  . Allergy   . Hyperlipidemia   . Hypertension   . Cancer 2011    prostate  . Obesity     moderate  . Aortic stenosis   . CAD (coronary artery disease)     cabg  . History of gout   . Heart murmur   . History of hepatitis 1974  . Type II or unspecified type diabetes mellitus without mention of complication, not stated as uncontrolled   . Hemorrhoids   . Sensorineural hearing loss   . Hypertrophic obstructive cardiomyopathy   . Erectile dysfunction   . Hemochromatosis 02/14/2011    History   Social History  . Marital Status: Married    Spouse Name: N/A    Number of Children: N/A  . Years of Education: N/A   Occupational History  . Not on file.   Social History Main Topics  . Smoking status: Former Smoker    Types: Cigars  . Smokeless tobacco: Not on file  . Alcohol Use: Yes     Comment: 4 glasses wine a month  . Drug Use: Not on file  . Sexually Active: Not on file   Other Topics Concern  . Not on file   Social History Narrative  . No narrative on file    Past Surgical History  Procedure Date  . Prostate seed implant 3/12  . Coronary artery bypass graft 10/2003  . Aortic valve replacement     [facility]  . Hip surgery 2006    right hip  . Cholecystectomy 1990  . Hernia repair 1999    right, inguinal  . Hernia repair 2002    left, inguinal  . Pilonidal cyst excision 1964  . Tonsillectomy childhood  . Appendectomy 1990    Family History  Problem Relation Age of Onset  . Heart  disease Mother   . Stroke Mother   . Heart disease Father   . Coronary artery disease Brother   . Asperger's syndrome Son   . Hyperlipidemia Son   . Cancer Neg Hx     negative for colon cancer    No Known Allergies  Current Outpatient Prescriptions on File Prior to Visit  Medication Sig Dispense Refill  . atorvastatin (LIPITOR) 20 MG tablet Take 1 tablet (20 mg total) by mouth daily.  30 tablet  9  . BENICAR HCT 20-12.5 MG per tablet TAKE ONE (1) TABLET EACH DAY  30 tablet  1  . JANUVIA 100 MG tablet TAKE ONE (1) TABLET EACH DAY  30 tablet  3  . Lancets (FREESTYLE) lancets Use to check blood sugar once a day as directed.  100 each  1  . metFORMIN (GLUCOPHAGE) 500 MG tablet Take 2 tablets twice a day  120 tablet  2  . metoprolol succinate (TOPROL-XL) 25 MG 24 hr tablet Take 25 mg by mouth daily.       . Omega-3 Fatty  Acids (FISH OIL) 1000 MG CAPS Take 2 capsules by mouth 2 (two) times daily.        Marland Kitchen warfarin (JANTOVEN) 5 MG tablet Take as directed by [facility]  45 tablet  3    BP 122/74  Pulse 75  Temp 98.5 F (36.9 C) (Oral)  Resp 16  Ht 5\' 10"  (1.778 m)  Wt 234 lb 1.9 oz (106.196 kg)  BMI 33.59 kg/m2  SpO2 97%       Objective:   Physical Exam  Constitutional: He is oriented to person, place, and time. He appears well-developed and well-nourished. No distress.  Cardiovascular: Normal rate and regular rhythm.   No murmur heard. Pulmonary/Chest: Effort normal and breath sounds normal. No respiratory distress. He has no wheezes. He has no rales. He exhibits no tenderness.  Musculoskeletal:       Trace bilateral LE edema  Neurological: He is alert and oriented to person, place, and time.  Skin: Skin is warm and dry.  Psychiatric: He has a normal mood and affect. His behavior is normal. Judgment and thought content normal.          Assessment & Plan:

## 2012-02-21 NOTE — Assessment & Plan Note (Signed)
Will obtain home sleep study.  

## 2012-02-21 NOTE — Assessment & Plan Note (Signed)
BP is stable on toprol xl and benicar hct. He is taking 1/2 tab of each.

## 2012-02-21 NOTE — Assessment & Plan Note (Signed)
Clinically stable on Januvia and metformin.  Obtain A1C.

## 2012-02-22 ENCOUNTER — Telehealth: Payer: Self-pay | Admitting: Family

## 2012-02-22 MED ORDER — GLIPIZIDE ER 2.5 MG PO TB24: 2.5000 mg | ORAL_TABLET | Freq: Every day | ORAL | Status: DC

## 2012-02-22 NOTE — Telephone Encounter (Signed)
Pls call pt and let him know A1C is up.  I would like him to continue to work on diet/exercise/weight loss.  Continue januvia, add low dose glucotrol once daily.

## 2012-02-23 NOTE — Telephone Encounter (Signed)
Pt informed of results and NP's advisement. Pt verbalized understanding.

## 2012-02-25 ENCOUNTER — Encounter: Payer: Self-pay | Admitting: Family

## 2012-02-28 ENCOUNTER — Telehealth: Payer: Self-pay | Admitting: Family

## 2012-02-28 NOTE — Telephone Encounter (Signed)
Patient states that no one has contacted him about his home sleep test as of yet. He says that when he picks it up he would like to also have a tetanus/tdap shot?

## 2012-02-28 NOTE — Telephone Encounter (Signed)
Spoke with [name] at Pulmonology office and she states she will be contacting pt in the next 1-2 days to arrange home sleep test. (Process can take 1-2 weeks to set pt up.) Notified pt pulmonology will be contacting him to arrange procedure. Scheduled nurse visit for tdap for Friday at 4pm.

## 2012-02-29 ENCOUNTER — Ambulatory Visit: Payer: Medicare Other

## 2012-03-01 ENCOUNTER — Ambulatory Visit (INDEPENDENT_AMBULATORY_CARE_PROVIDER_SITE_OTHER): Payer: Medicare Other | Admitting: Family

## 2012-03-01 DIAGNOSIS — Z23 Encounter for immunization: Secondary | ICD-10-CM

## 2012-03-06 DIAGNOSIS — G4733 Obstructive sleep apnea (adult) (pediatric): Secondary | ICD-10-CM | POA: Diagnosis not present

## 2012-03-07 ENCOUNTER — Encounter: Payer: Self-pay | Admitting: Family

## 2012-03-11 ENCOUNTER — Other Ambulatory Visit: Payer: Self-pay | Admitting: Family

## 2012-03-13 ENCOUNTER — Encounter: Payer: Self-pay | Admitting: Family

## 2012-03-13 DIAGNOSIS — E291 Testicular hypofunction: Secondary | ICD-10-CM | POA: Diagnosis not present

## 2012-03-13 DIAGNOSIS — C61 Malignant neoplasm of prostate: Secondary | ICD-10-CM | POA: Diagnosis not present

## 2012-03-15 ENCOUNTER — Other Ambulatory Visit: Payer: Medicare Other | Admitting: Lab

## 2012-03-18 ENCOUNTER — Telehealth: Payer: Self-pay

## 2012-03-18 NOTE — Telephone Encounter (Signed)
Office closed, call in the am

## 2012-03-19 ENCOUNTER — Encounter: Payer: Self-pay | Admitting: Family

## 2012-03-19 ENCOUNTER — Encounter: Payer: Self-pay | Admitting: Hematology & Oncology

## 2012-03-19 NOTE — Telephone Encounter (Signed)
Returning call can be reached at [phone] says to choose an option and you can probably reach her.[name]

## 2012-03-19 NOTE — Telephone Encounter (Signed)
lmomtcb for [place].    PCCs, pls advise on the status home sleep study order placed on 02/21/12 by Sandford Craze, NP.  Thank you.

## 2012-03-20 ENCOUNTER — Ambulatory Visit: Payer: Medicare Other | Admitting: Cardiology

## 2012-03-20 ENCOUNTER — Ambulatory Visit (INDEPENDENT_AMBULATORY_CARE_PROVIDER_SITE_OTHER): Payer: Medicare Other | Admitting: *Deleted

## 2012-03-20 DIAGNOSIS — Z954 Presence of other heart-valve replacement: Secondary | ICD-10-CM

## 2012-03-20 DIAGNOSIS — I359 Nonrheumatic aortic valve disorder, unspecified: Secondary | ICD-10-CM

## 2012-03-20 NOTE — Telephone Encounter (Signed)
Pt's study was in a folder for dr Vassie Loll to read he is out of the office and we will give it to dr sood to read left message for [name] and pt is aware [name]

## 2012-03-21 ENCOUNTER — Other Ambulatory Visit (HOSPITAL_BASED_OUTPATIENT_CLINIC_OR_DEPARTMENT_OTHER): Payer: Medicare Other | Admitting: Lab

## 2012-03-21 LAB — CBC WITH DIFFERENTIAL (CANCER CENTER ONLY)
Eosinophils Absolute: 0.3 10*3/uL (ref 0.0–0.5)
HCT: 36.5 % — ABNORMAL LOW (ref 38.7–49.9)
HGB: 13 g/dL (ref 13.0–17.1)
LYMPH%: 15 % (ref 14.0–48.0)
MCV: 88 fL (ref 82–98)
MONO#: 0.6 10*3/uL (ref 0.1–0.9)
NEUT%: 73.6 % (ref 40.0–80.0)
RBC: 4.14 10*6/uL — ABNORMAL LOW (ref 4.20–5.70)
WBC: 7.8 10*3/uL (ref 4.0–10.0)

## 2012-03-21 NOTE — Telephone Encounter (Signed)
Spoke with Shawna Orleans at [facility], report has been given to Dr Craige Cotta to read, should receive result soon. Will check periodically for result.

## 2012-03-22 ENCOUNTER — Other Ambulatory Visit: Payer: Self-pay | Admitting: Hematology & Oncology

## 2012-03-25 ENCOUNTER — Ambulatory Visit (INDEPENDENT_AMBULATORY_CARE_PROVIDER_SITE_OTHER): Payer: Medicare Other | Admitting: Cardiology

## 2012-03-25 ENCOUNTER — Ambulatory Visit: Payer: Medicare Other | Admitting: Cardiology

## 2012-03-25 ENCOUNTER — Encounter: Payer: Self-pay | Admitting: Cardiology

## 2012-03-25 VITALS — BP 140/78 | HR 68 | Ht 70.5 in | Wt 236.0 lb

## 2012-03-25 DIAGNOSIS — I421 Obstructive hypertrophic cardiomyopathy: Secondary | ICD-10-CM

## 2012-03-25 DIAGNOSIS — E7800 Pure hypercholesterolemia, unspecified: Secondary | ICD-10-CM | POA: Diagnosis not present

## 2012-03-25 DIAGNOSIS — I25810 Atherosclerosis of coronary artery bypass graft(s) without angina pectoris: Secondary | ICD-10-CM

## 2012-03-25 DIAGNOSIS — I2510 Atherosclerotic heart disease of native coronary artery without angina pectoris: Secondary | ICD-10-CM

## 2012-03-25 DIAGNOSIS — Z954 Presence of other heart-valve replacement: Secondary | ICD-10-CM | POA: Diagnosis not present

## 2012-03-25 NOTE — Progress Notes (Signed)
HPI:  This nice gentleman is in for follow up.  He is doing pretty well.  He has had treatment for prostate cancer and his PSA is low.  He sees Dr. Myna Hidalgo for treatment of hemochromatosis.  He denies any current complaints.  Denies shortness of breath.  He got a fitness center membership in December and is going to the gym 2-3 days per week.  No new symptoms.    Current Outpatient Prescriptions  Medication Sig Dispense Refill  . atorvastatin (LIPITOR) 20 MG tablet Take 1 tablet (20 mg total) by mouth daily.  30 tablet  9  . JANUVIA 100 MG tablet TAKE ONE (1) TABLET EACH DAY  30 tablet  3  . Lancets (FREESTYLE) lancets Use to check blood sugar once a day as directed.  100 each  1  . metFORMIN (GLUCOPHAGE) 500 MG tablet Take 2 tablets twice a day  120 tablet  2  . metoprolol succinate (TOPROL-XL) 25 MG 24 hr tablet Take 0.5 tablets (12.5 mg total) by mouth daily.  90 tablet  3  . olmesartan-hydrochlorothiazide (BENICAR HCT) 20-12.5 MG per tablet       . Omega-3 Fatty Acids (FISH OIL) 1000 MG CAPS Take 2 capsules by mouth 2 (two) times daily.        Marland Kitchen warfarin (JANTOVEN) 5 MG tablet Take as directed by [facility]  45 tablet  3   No current facility-administered medications for this visit.    No Known Allergies  Past Medical History  Diagnosis Date  . Allergy   . Hyperlipidemia   . Hypertension   . Cancer 2011    prostate  . Obesity     moderate  . Aortic stenosis   . CAD (coronary artery disease)     cabg  . History of gout   . Heart murmur   . History of hepatitis 1974  . Type II or unspecified type diabetes mellitus without mention of complication, not stated as uncontrolled   . Hemorrhoids   . Sensorineural hearing loss   . Hypertrophic obstructive cardiomyopathy   . Erectile dysfunction   . Hemochromatosis 02/14/2011    Past Surgical History  Procedure Laterality Date  . Prostate seed implant  3/12  . Coronary artery bypass graft  10/2003  . Aortic valve  replacement      [facility]  . Hip surgery  2006    right hip  . Cholecystectomy  1990  . Hernia repair  1999    right, inguinal  . Hernia repair  2002    left, inguinal  . Pilonidal cyst excision  1964  . Tonsillectomy  childhood  . Appendectomy  1990    Family History  Problem Relation Age of Onset  . Heart disease Mother   . Stroke Mother   . Heart disease Father   . Coronary artery disease Brother   . Asperger's syndrome Son   . Hyperlipidemia Son   . Cancer Neg Hx     negative for colon cancer    History   Social History  . Marital Status: Married    Spouse Name: N/A    Number of Children: N/A  . Years of Education: N/A   Occupational History  . Not on file.   Social History Main Topics  . Smoking status: Former Smoker    Types: Cigars  . Smokeless tobacco: Not on file  . Alcohol Use: Yes     Comment: 4 glasses wine a month  .  Drug Use: Not on file  . Sexually Active: Not on file   Other Topics Concern  . Not on file   Social History Narrative  . No narrative on file    ROS: Please see the HPI.  All other systems reviewed and negative.  PHYSICAL EXAM:  BP 140/78  Pulse 68  Ht 5' 10.5" (1.791 m)  Wt 236 lb (107.049 kg)  BMI 33.37 kg/m2  SpO2 96%  General: Well developed, well nourished, in no acute distress. Head:  Normocephalic and atraumatic. Neck: no JVD Lungs: Clear to auscultation and percussion. Heart: Normal S1 and S2. Crisp VS. No systolic or diastolic murmurs.    Abdomen:  Normal bowel sounds; soft; non tender; no organomegaly Pulses: Pulses normal in all 4 extremities. Extremities: No clubbing or cyanosis. No edema. Neurologic: Alert and oriented x 3.  EKG:  NSR.  Delay in r wave progression cannot exclude ASMI, old. No acute changes.  No sig change from prior tracing  (lead position).  ASSESSMENT AND PLAN:

## 2012-03-25 NOTE — Patient Instructions (Addendum)
Your physician wants you to follow-up in: 6 MONTHS with Dr Jens Som in the [facility] office (previous pt of Dr Riley Kill).  You will receive a reminder letter in the mail two months in advance. If you don't receive a letter, please call our office to schedule the follow-up appointment.  Your physician recommends that you continue on your current medications as directed. Please refer to the Current Medication list given to you today.

## 2012-03-26 ENCOUNTER — Telehealth: Payer: Self-pay | Admitting: Family

## 2012-03-26 DIAGNOSIS — G4733 Obstructive sleep apnea (adult) (pediatric): Secondary | ICD-10-CM

## 2012-03-26 NOTE — Telephone Encounter (Signed)
Message copied by Sandford Craze on Tue Mar 26, 2012  1:24 PM ------      Message from: Coralyn Helling      Created: Thu Mar 21, 2012  6:21 PM       Reviewed home sleep study from 03/06/12.  He has moderate sleep apnea (AHI 18.4, SaO2 low 82%).  Report should be in Epic soon.  Let me know if you need further assistance with this.            Thanks.            Vineet ------

## 2012-03-26 NOTE — Assessment & Plan Note (Signed)
The patient continues to do well.  He has excellent VS, crisp VS, sp mechanical AV.

## 2012-03-26 NOTE — Assessment & Plan Note (Signed)
No impressive murmur.  Had septal myomectomy at time of CABG and AVR.  Exam still unremarkable.  Last echo is 2011.  May need to repeat next year in follow up.

## 2012-03-26 NOTE — Assessment & Plan Note (Signed)
Will eventually need labs.

## 2012-03-26 NOTE — Assessment & Plan Note (Signed)
He currently experiences no angina.  Seems to be doing well without symptoms.

## 2012-03-26 NOTE — Telephone Encounter (Signed)
Pls call pt and let him know that his sleep study showe + sleep apnea.  I will arrange for a CPAP to be delivered to his home.

## 2012-03-27 ENCOUNTER — Other Ambulatory Visit: Payer: Medicare Other | Admitting: Lab

## 2012-03-27 ENCOUNTER — Telehealth: Payer: Self-pay | Admitting: Hematology & Oncology

## 2012-03-27 DIAGNOSIS — E119 Type 2 diabetes mellitus without complications: Secondary | ICD-10-CM | POA: Diagnosis not present

## 2012-03-27 NOTE — Telephone Encounter (Signed)
Patient came in for 03/27/12 apt and decided to cx apt due to having to leave.  He resch for 03/29/12

## 2012-03-27 NOTE — Telephone Encounter (Signed)
Pt notified via mychart

## 2012-03-28 ENCOUNTER — Telehealth: Payer: Self-pay | Admitting: Pulmonary Disease

## 2012-03-28 ENCOUNTER — Encounter: Payer: Self-pay | Admitting: Family

## 2012-03-28 NOTE — Telephone Encounter (Signed)
[name] in HIM located home sleep study and this has been scanned into Epic. Contacted [name] with [facility] and lmoam of this. If she has any questions, to please return my call. [name]

## 2012-03-29 ENCOUNTER — Other Ambulatory Visit: Payer: Medicare Other | Admitting: Lab

## 2012-03-29 ENCOUNTER — Other Ambulatory Visit (HOSPITAL_BASED_OUTPATIENT_CLINIC_OR_DEPARTMENT_OTHER): Payer: Medicare Other | Admitting: Lab

## 2012-03-29 DIAGNOSIS — D649 Anemia, unspecified: Secondary | ICD-10-CM | POA: Diagnosis not present

## 2012-03-29 LAB — IRON AND TIBC
%SAT: 24 % (ref 20–55)
TIBC: 419 ug/dL (ref 215–435)
UIBC: 318 ug/dL (ref 125–400)

## 2012-03-29 LAB — FERRITIN: Ferritin: 93 ng/mL (ref 22–322)

## 2012-03-30 ENCOUNTER — Encounter: Payer: Self-pay | Admitting: Family

## 2012-04-01 ENCOUNTER — Telehealth: Payer: Self-pay | Admitting: *Deleted

## 2012-04-01 NOTE — Telephone Encounter (Signed)
Signed.

## 2012-04-01 NOTE — Telephone Encounter (Signed)
Referrall faxed to # below.

## 2012-04-01 NOTE — Telephone Encounter (Signed)
Spoke with [facility] re: CPAP referral and they states that order must have Provider's NPI # and must be initialed by Provider. Referral printed and forwarded to Provider for completion. Will fax to [phone].

## 2012-04-02 ENCOUNTER — Encounter: Payer: Self-pay | Admitting: Family

## 2012-04-02 ENCOUNTER — Encounter: Payer: Self-pay | Admitting: Hematology & Oncology

## 2012-04-02 NOTE — Telephone Encounter (Signed)
f °

## 2012-04-03 ENCOUNTER — Telehealth: Payer: Self-pay | Admitting: *Deleted

## 2012-04-03 NOTE — Telephone Encounter (Signed)
Message copied by Anselm Jungling on Wed Apr 03, 2012  3:43 PM ------      Message from: Josph Macho      Created: Sat Mar 30, 2012  8:40 AM       Please call and let him know that his ferritin is going up slowly. I think he needs 1 phlebotomy to make sure that we get the iron back down. Please set this up. Thanks. Pete ------

## 2012-04-03 NOTE — Telephone Encounter (Signed)
Called patient and left message that his ferritin is going up slowly and needs one phlebotomy to get iron levels back down.  Asked patient to call us back to schedule a phlebotomy

## 2012-04-08 ENCOUNTER — Telehealth: Payer: Self-pay | Admitting: Hematology & Oncology

## 2012-04-08 ENCOUNTER — Encounter: Payer: Self-pay | Admitting: Hematology & Oncology

## 2012-04-08 NOTE — Telephone Encounter (Signed)
Per Alvino Chapel to resch 04/05/12 missed pat.  Apt was resch for 04/11/12.  i called to give resch apt date/time.  Patient did not answer, so i left message on voice mail

## 2012-04-09 DIAGNOSIS — L570 Actinic keratosis: Secondary | ICD-10-CM | POA: Diagnosis not present

## 2012-04-10 ENCOUNTER — Encounter: Payer: Self-pay | Admitting: Hematology & Oncology

## 2012-04-11 ENCOUNTER — Ambulatory Visit (HOSPITAL_BASED_OUTPATIENT_CLINIC_OR_DEPARTMENT_OTHER): Payer: Medicare Other

## 2012-04-11 NOTE — Patient Instructions (Signed)

## 2012-04-11 NOTE — Progress Notes (Signed)
[name] presents today for phlebotomy per MD orders. Phlebotomy procedure started at 1050 and ended at 1105. 300 ml removed. Patient observed for 30 minutes after procedure without any incident. Patient tolerated procedure well. IV needle removed intact.

## 2012-04-17 ENCOUNTER — Ambulatory Visit (INDEPENDENT_AMBULATORY_CARE_PROVIDER_SITE_OTHER): Payer: Medicare Other | Admitting: *Deleted

## 2012-04-17 DIAGNOSIS — Z954 Presence of other heart-valve replacement: Secondary | ICD-10-CM

## 2012-04-17 DIAGNOSIS — I359 Nonrheumatic aortic valve disorder, unspecified: Secondary | ICD-10-CM | POA: Diagnosis not present

## 2012-05-24 ENCOUNTER — Encounter: Payer: Self-pay | Admitting: Family

## 2012-05-24 ENCOUNTER — Ambulatory Visit (INDEPENDENT_AMBULATORY_CARE_PROVIDER_SITE_OTHER): Payer: Medicare Other | Admitting: Family

## 2012-05-24 VITALS — BP 110/60 | HR 73 | Temp 98.1°F | Resp 16 | Ht 70.0 in | Wt 233.0 lb

## 2012-05-24 DIAGNOSIS — E119 Type 2 diabetes mellitus without complications: Secondary | ICD-10-CM

## 2012-05-24 DIAGNOSIS — R21 Rash and other nonspecific skin eruption: Secondary | ICD-10-CM | POA: Diagnosis not present

## 2012-05-24 DIAGNOSIS — I10 Essential (primary) hypertension: Secondary | ICD-10-CM | POA: Diagnosis not present

## 2012-05-24 DIAGNOSIS — L570 Actinic keratosis: Secondary | ICD-10-CM

## 2012-05-24 NOTE — Progress Notes (Signed)
Subjective:    Patient ID: [name], male    DOB: 08-16-1945, 67 y.o.   MRN: [id number]  HPI  DM2- reports sugars around 120's.    HTN-  Denies CP/SOB, swelling is improved.  Rash- has seen [place] [facility] for a rash on his left arm.  Requests rx to a different dermatologist.   Review of Systems See HPI  Past Medical History  Diagnosis Date  . Allergy   . Hyperlipidemia   . Hypertension   . Cancer 2011    prostate  . Obesity     moderate  . Aortic stenosis   . CAD (coronary artery disease)     cabg  . History of gout   . Heart murmur   . History of hepatitis 1974  . Type II or unspecified type diabetes mellitus without mention of complication, not stated as uncontrolled   . Hemorrhoids   . Sensorineural hearing loss   . Hypertrophic obstructive cardiomyopathy   . Erectile dysfunction   . Hemochromatosis 02/14/2011    History   Social History  . Marital Status: Married    Spouse Name: N/A    Number of Children: N/A  . Years of Education: N/A   Occupational History  . Not on file.   Social History Main Topics  . Smoking status: Former Smoker    Types: Cigars  . Smokeless tobacco: Not on file  . Alcohol Use: Yes     Comment: 4 glasses wine a month  . Drug Use: Not on file  . Sexually Active: Not on file   Other Topics Concern  . Not on file   Social History Narrative  . No narrative on file    Past Surgical History  Procedure Laterality Date  . Prostate seed implant  3/12  . Coronary artery bypass graft  10/2003  . Aortic valve replacement      [facility]  . Hip surgery  2006    right hip  . Cholecystectomy  1990  . Hernia repair  1999    right, inguinal  . Hernia repair  2002    left, inguinal  . Pilonidal cyst excision  1964  . Tonsillectomy  childhood  . Appendectomy  1990    Family History  Problem Relation Age of Onset  . Heart disease Mother   . Stroke Mother   . Heart disease Father   . Coronary artery disease  Brother   . Asperger's syndrome Son   . Hyperlipidemia Son   . Cancer Neg Hx     negative for colon cancer    No Known Allergies  Current Outpatient Prescriptions on File Prior to Visit  Medication Sig Dispense Refill  . atorvastatin (LIPITOR) 20 MG tablet Take 1 tablet (20 mg total) by mouth daily.  30 tablet  9  . glipiZIDE (GLUCOTROL XL) 2.5 MG 24 hr tablet Take 2.5 mg by mouth daily.      Marland Kitchen JANUVIA 100 MG tablet TAKE ONE (1) TABLET EACH DAY  30 tablet  3  . Lancets (FREESTYLE) lancets Use to check blood sugar once a day as directed.  100 each  1  . metFORMIN (GLUCOPHAGE) 500 MG tablet Take 2 tablets twice a day  120 tablet  2  . metoprolol succinate (TOPROL-XL) 25 MG 24 hr tablet Take 0.5 tablets (12.5 mg total) by mouth daily.  90 tablet  3  . olmesartan-hydrochlorothiazide (BENICAR HCT) 20-12.5 MG per tablet       .  Omega-3 Fatty Acids (FISH OIL) 1000 MG CAPS Take 2 capsules by mouth 2 (two) times daily.        Marland Kitchen warfarin (JANTOVEN) 5 MG tablet Take as directed by [facility]  45 tablet  3   No current facility-administered medications on file prior to visit.    BP 110/60  Pulse 73  Temp(Src) 98.1 F (36.7 C) (Oral)  Resp 16  Ht 5\' 10"  (1.778 m)  Wt 233 lb 0.6 oz (105.706 kg)  BMI 33.44 kg/m2  SpO2 97%       Objective:   Physical Exam  Constitutional: He is oriented to person, place, and time. He appears well-developed and well-nourished. No distress.  Cardiovascular: Normal rate and regular rhythm.   No murmur heard. Pulmonary/Chest: Effort normal and breath sounds normal. No respiratory distress. He has no wheezes. He has no rales. He exhibits no tenderness.  Neurological: He is alert and oriented to person, place, and time.  Skin: Skin is warm.  Psychiatric: He has a normal mood and affect. His behavior is normal. Judgment and thought content normal.          Assessment & Plan:

## 2012-05-24 NOTE — Patient Instructions (Addendum)
Please return to the lab for blood work. Follow up in 3 months.

## 2012-05-26 DIAGNOSIS — E1165 Type 2 diabetes mellitus with hyperglycemia: Secondary | ICD-10-CM | POA: Insufficient documentation

## 2012-05-26 DIAGNOSIS — R21 Rash and other nonspecific skin eruption: Secondary | ICD-10-CM | POA: Insufficient documentation

## 2012-05-26 NOTE — Assessment & Plan Note (Signed)
Clinically stable.  Continue Januvia and metformin. Obtain a1C

## 2012-05-26 NOTE — Assessment & Plan Note (Signed)
BP stable on benicar HCT and toprol.

## 2012-05-26 NOTE — Assessment & Plan Note (Signed)
Refer to a new dermatologist at pt request.

## 2012-05-27 ENCOUNTER — Other Ambulatory Visit: Payer: Self-pay | Admitting: Family

## 2012-05-27 NOTE — Telephone Encounter (Signed)
Rx request to pharmacy/SLS  

## 2012-05-29 ENCOUNTER — Ambulatory Visit (INDEPENDENT_AMBULATORY_CARE_PROVIDER_SITE_OTHER): Payer: Medicare Other | Admitting: *Deleted

## 2012-05-29 DIAGNOSIS — Z954 Presence of other heart-valve replacement: Secondary | ICD-10-CM

## 2012-05-29 DIAGNOSIS — I359 Nonrheumatic aortic valve disorder, unspecified: Secondary | ICD-10-CM | POA: Diagnosis not present

## 2012-05-31 ENCOUNTER — Telehealth: Payer: Self-pay | Admitting: *Deleted

## 2012-05-31 DIAGNOSIS — L570 Actinic keratosis: Secondary | ICD-10-CM | POA: Diagnosis not present

## 2012-05-31 DIAGNOSIS — E119 Type 2 diabetes mellitus without complications: Secondary | ICD-10-CM | POA: Diagnosis not present

## 2012-05-31 DIAGNOSIS — L259 Unspecified contact dermatitis, unspecified cause: Secondary | ICD-10-CM | POA: Diagnosis not present

## 2012-05-31 LAB — BASIC METABOLIC PANEL WITH GFR
BUN: 28 mg/dL — ABNORMAL HIGH (ref 6–23)
Calcium: 9.4 mg/dL (ref 8.4–10.5)
GFR, Est African American: 73 mL/min
Glucose, Bld: 130 mg/dL — ABNORMAL HIGH (ref 70–99)
Sodium: 139 mEq/L (ref 135–145)

## 2012-05-31 MED ORDER — ATORVASTATIN CALCIUM 20 MG PO TABS: 20.0000 mg | ORAL_TABLET | Freq: Every day | ORAL | Status: DC

## 2012-05-31 NOTE — Telephone Encounter (Signed)
Received request from pt for a refill of atorvastatin and januvia. Upon review of pt's record it appears Januvia was sent electronically to [facility] on 05/27/12. Refill sent for atorvastatin. Spoke with pharmacy re: [facility] and they verified receipt of rx. Left detailed message on home# re: completion of refills and to call if any questions.

## 2012-06-03 ENCOUNTER — Encounter: Payer: Self-pay | Admitting: Family

## 2012-06-03 ENCOUNTER — Other Ambulatory Visit: Payer: Self-pay | Admitting: *Deleted

## 2012-06-03 MED ORDER — WARFARIN SODIUM 5 MG PO TABS: ORAL_TABLET | ORAL | Status: DC

## 2012-06-11 ENCOUNTER — Telehealth: Payer: Self-pay | Admitting: Family

## 2012-06-11 NOTE — Telephone Encounter (Signed)
Refill-glipizide er 2.5mg  ter. Take one tablet each day. Qty 30 last fill 4.16.14

## 2012-06-11 NOTE — Telephone Encounter (Signed)
Refill- freestyle lite test strips. Check blood sugar once a day. Qty 50

## 2012-06-12 ENCOUNTER — Ambulatory Visit (HOSPITAL_BASED_OUTPATIENT_CLINIC_OR_DEPARTMENT_OTHER): Payer: Medicare Other | Admitting: Hematology & Oncology

## 2012-06-12 ENCOUNTER — Other Ambulatory Visit (HOSPITAL_BASED_OUTPATIENT_CLINIC_OR_DEPARTMENT_OTHER): Payer: Medicare Other | Admitting: Lab

## 2012-06-12 DIAGNOSIS — Z7901 Long term (current) use of anticoagulants: Secondary | ICD-10-CM | POA: Diagnosis not present

## 2012-06-12 DIAGNOSIS — E119 Type 2 diabetes mellitus without complications: Secondary | ICD-10-CM

## 2012-06-12 LAB — CBC WITH DIFFERENTIAL (CANCER CENTER ONLY)
BASO#: 0 10*3/uL (ref 0.0–0.2)
Eosinophils Absolute: 0.3 10*3/uL (ref 0.0–0.5)
HCT: 36.8 % — ABNORMAL LOW (ref 38.7–49.9)
LYMPH%: 21.1 % (ref 14.0–48.0)
MCH: 31.7 pg (ref 28.0–33.4)
MCV: 92 fL (ref 82–98)
MONO#: 0.6 10*3/uL (ref 0.1–0.9)
MONO%: 7.8 % (ref 0.0–13.0)
NEUT%: 67.3 % (ref 40.0–80.0)
RBC: 4.01 10*6/uL — ABNORMAL LOW (ref 4.20–5.70)
WBC: 8.2 10*3/uL (ref 4.0–10.0)

## 2012-06-12 LAB — IRON AND TIBC
%SAT: 23 % (ref 20–55)
TIBC: 395 ug/dL (ref 215–435)

## 2012-06-12 MED ORDER — FREESTYLE LANCETS MISC: Status: DC

## 2012-06-12 MED ORDER — GLIPIZIDE ER 2.5 MG PO TB24: 2.5000 mg | ORAL_TABLET | Freq: Every day | ORAL | Status: DC

## 2012-06-12 NOTE — Telephone Encounter (Signed)
Rx sent in to pharmacy. 

## 2012-06-12 NOTE — Progress Notes (Signed)
This office note has been dictated.

## 2012-06-13 ENCOUNTER — Ambulatory Visit (INDEPENDENT_AMBULATORY_CARE_PROVIDER_SITE_OTHER): Payer: Medicare Other | Admitting: Pharmacist

## 2012-06-13 ENCOUNTER — Telehealth: Payer: Self-pay | Admitting: Hematology & Oncology

## 2012-06-13 DIAGNOSIS — I359 Nonrheumatic aortic valve disorder, unspecified: Secondary | ICD-10-CM

## 2012-06-13 DIAGNOSIS — Z954 Presence of other heart-valve replacement: Secondary | ICD-10-CM | POA: Diagnosis not present

## 2012-06-13 NOTE — Progress Notes (Signed)
CC:   Sandford Craze, NP Bertram Millard. Dahlstedt, M.D.  DIAGNOSES: 1. Hemochromatosis (H63D homozygous mutation). 2. Stage I prostate cancer, status post radiation therapy and     radiation seeds.  CURRENT THERAPY:  Phlebotomy to maintain ferritin less than 100.  INTERIM HISTORY:  [name] comes in for followup.  We last saw him back in November.  He is doing okay right now.  His wife reports he had kidney surgery.  It sounds like he has some kind of blockage.  We last checked his ferritin back in February and it was 93.  Iron saturation was only 24%.  His last phlebotomy was back in early March.  Again, he is doing fairly well.  He is not having any kind of joint issues.  He does have some skin issues with his left forearm.  He sees a dermatologist for this.  There has been no change in his medications.  He is on Coumadin because of an artificial aortic valve.  He also has diabetes.  He was on glipizide which he stopped temporarily but now is back on this.  PHYSICAL EXAMINATION:  General:  This is a well-developed, well- nourished, white gentleman in no obvious distress.  Vital Signs: Temperature of 98.2, pulse 70, respiratory rate 78, blood pressure 130/67.  Weight is 234.  Head and Neck:  Normocephalic, atraumatic skull.  There are no ocular or oral lesions.  There are no palpable cervical or supraclavicular lymph nodes.  Lungs:  Clear bilaterally. Cardiac:  Regular rate and rhythm with a normal S1, S2.  He has a systolic click from his prostatic valve.  ABDOMEN:  Soft with good bowel sounds.  There is no palpable abdominal mass.  There is no palpable hepatosplenomegaly.  Back:  No tenderness over the spine, ribs, or hips. Extremities:  No clubbing, cyanosis, or edema.  Neurological:  No focal neurological deficits.  Skin:  Macular type rash on the extensor aspect of his left forearm.  LABORATORY STUDIES:  White cell count is 8.2, hemoglobin 12.7, hematocrit 36.8,  platelet count 186.  IMPRESSION:  [name] is a 67 year old gentleman with hemochromatosis. We are doing well without phlebotomies.  He has had no complications from the hemochromatosis to date.  We will probably plan for 80-month followup.  I will make sure he comes back in 3 months for lab work so that we can ensure that the ferritin is staying below 100.    ______________________________ Josph Macho, M.D. PRE/MEDQ  D:  06/12/2012  T:  06/13/2012  Job:  [id number]

## 2012-06-13 NOTE — Telephone Encounter (Signed)
Left pt message with 11-7 and 14th appointment changes

## 2012-06-14 ENCOUNTER — Encounter: Payer: Self-pay | Admitting: *Deleted

## 2012-06-15 ENCOUNTER — Encounter: Payer: Self-pay | Admitting: Hematology & Oncology

## 2012-06-15 ENCOUNTER — Encounter: Payer: Self-pay | Admitting: Cardiology

## 2012-06-16 ENCOUNTER — Encounter: Payer: Self-pay | Admitting: Cardiology

## 2012-06-16 ENCOUNTER — Encounter: Payer: Self-pay | Admitting: Hematology & Oncology

## 2012-06-26 ENCOUNTER — Encounter: Payer: Self-pay | Admitting: Family

## 2012-06-28 ENCOUNTER — Encounter: Payer: Self-pay | Admitting: Family

## 2012-07-04 ENCOUNTER — Ambulatory Visit (INDEPENDENT_AMBULATORY_CARE_PROVIDER_SITE_OTHER): Payer: Medicare Other | Admitting: *Deleted

## 2012-07-04 DIAGNOSIS — I359 Nonrheumatic aortic valve disorder, unspecified: Secondary | ICD-10-CM

## 2012-07-04 DIAGNOSIS — Z954 Presence of other heart-valve replacement: Secondary | ICD-10-CM

## 2012-07-04 LAB — POCT INR: INR: 1.9

## 2012-07-04 MED ORDER — WARFARIN SODIUM 5 MG PO TABS: ORAL_TABLET | ORAL | Status: DC

## 2012-07-06 ENCOUNTER — Other Ambulatory Visit: Payer: Self-pay | Admitting: Family

## 2012-07-08 ENCOUNTER — Encounter: Payer: Self-pay | Admitting: Family

## 2012-07-10 ENCOUNTER — Ambulatory Visit: Payer: Medicare Other | Admitting: Family

## 2012-07-12 ENCOUNTER — Telehealth: Payer: Self-pay | Admitting: *Deleted

## 2012-07-12 ENCOUNTER — Ambulatory Visit (INDEPENDENT_AMBULATORY_CARE_PROVIDER_SITE_OTHER): Payer: Medicare Other | Admitting: Family

## 2012-07-12 ENCOUNTER — Encounter: Payer: Self-pay | Admitting: Family

## 2012-07-12 VITALS — BP 110/66 | HR 75 | Temp 98.1°F | Resp 16 | Wt 231.1 lb

## 2012-07-12 DIAGNOSIS — G4733 Obstructive sleep apnea (adult) (pediatric): Secondary | ICD-10-CM | POA: Diagnosis not present

## 2012-07-12 HISTORY — DX: Obstructive sleep apnea (adult) (pediatric): G47.33

## 2012-07-12 NOTE — Patient Instructions (Addendum)
Please follow up in August (after 8/2)

## 2012-07-12 NOTE — Assessment & Plan Note (Signed)
Tolerating CPAP.  Reinforced importance of compliance.  I did not receive download report from home health. Will request.  15 minutes spent with pt today.  >50% of this time was spent counseling pt on his sleep apnea.

## 2012-07-12 NOTE — Telephone Encounter (Signed)
Spoke with [facility] and requested they fax [name] pt's recent CPAP download. Information from February is being faxed.

## 2012-07-12 NOTE — Progress Notes (Signed)
Subjective:    Patient ID: [name], male    DOB: 10-13-1945, 67 y.o.   MRN: [id number]  HPI  [name] is a 67 yr old male who presents today for follow up of his OSA.  He is now using CPAP.  Reports that it seems to "open my sinuses."   Reports that he sleeps well with his CPAP, not using it every night however.  Feels better during the day.    Review of Systems See HPI  Past Medical History  Diagnosis Date  . Allergy   . Hyperlipidemia   . Hypertension   . Cancer 2011    prostate  . Obesity     moderate  . Aortic stenosis   . CAD (coronary artery disease)     cabg  . History of gout   . Heart murmur   . History of hepatitis 1974  . Type II or unspecified type diabetes mellitus without mention of complication, not stated as uncontrolled   . Hemorrhoids   . Sensorineural hearing loss   . Hypertrophic obstructive cardiomyopathy   . Erectile dysfunction   . Hemochromatosis 02/14/2011    History   Social History  . Marital Status: Married    Spouse Name: N/A    Number of Children: N/A  . Years of Education: N/A   Occupational History  . Not on file.   Social History Main Topics  . Smoking status: Former Smoker    Types: Cigars  . Smokeless tobacco: Not on file  . Alcohol Use: Yes     Comment: 4 glasses wine a month  . Drug Use: Not on file  . Sexually Active: Not on file   Other Topics Concern  . Not on file   Social History Narrative  . No narrative on file    Past Surgical History  Procedure Laterality Date  . Prostate seed implant  3/12  . Coronary artery bypass graft  10/2003  . Aortic valve replacement      [facility]  . Hip surgery  2006    right hip  . Cholecystectomy  1990  . Hernia repair  1999    right, inguinal  . Hernia repair  2002    left, inguinal  . Pilonidal cyst excision  1964  . Tonsillectomy  childhood  . Appendectomy  1990    Family History  Problem Relation Age of Onset  . Heart disease Mother   .  Stroke Mother   . Heart disease Father   . Coronary artery disease Brother   . Asperger's syndrome Son   . Hyperlipidemia Son   . Cancer Neg Hx     negative for colon cancer    No Known Allergies  Current Outpatient Prescriptions on File Prior to Visit  Medication Sig Dispense Refill  . atorvastatin (LIPITOR) 20 MG tablet Take 1 tablet (20 mg total) by mouth daily.  30 tablet  3  . glipiZIDE (GLUCOTROL XL) 2.5 MG 24 hr tablet Take 1 tablet (2.5 mg total) by mouth daily.  30 tablet  5  . JANUVIA 100 MG tablet TAKE ONE (1) TABLET EACH DAY  30 tablet  3  . Lancets (FREESTYLE) lancets Use to check blood sugar once a day as directed.  100 each  1  . metFORMIN (GLUCOPHAGE) 500 MG tablet Take 2 tablets twice a day  120 tablet  2  . metoprolol succinate (TOPROL-XL) 25 MG 24 hr tablet Take 0.5 tablets (12.5 mg  total) by mouth daily.  90 tablet  3  . olmesartan-hydrochlorothiazide (BENICAR HCT) 20-12.5 MG per tablet Take 1 tablet by mouth daily.  30 tablet  0  . Omega-3 Fatty Acids (FISH OIL) 1000 MG CAPS Take 2 capsules by mouth 2 (two) times daily.        Marland Kitchen warfarin (JANTOVEN) 5 MG tablet Take as directed by [facility]mad[facility] Clinic  50 tablet  3   No current facility-administered medications on file prior to visit.    BP 110/66  Pulse 75  Temp(Src) 98.1 F (36.7 C) (Oral)  Resp 16  Wt 231 lb 1.3 oz (104.817 kg)  BMI 33.16 kg/m2  SpO2 96%       Objective:   Physical Exam  Gen: awake, alert, NAD Psych:  A and O x 3, calm/pleasant      Assessment & Plan:

## 2012-07-18 ENCOUNTER — Other Ambulatory Visit: Payer: Self-pay | Admitting: Family

## 2012-07-18 ENCOUNTER — Ambulatory Visit (INDEPENDENT_AMBULATORY_CARE_PROVIDER_SITE_OTHER): Payer: Medicare Other | Admitting: *Deleted

## 2012-07-18 DIAGNOSIS — I359 Nonrheumatic aortic valve disorder, unspecified: Secondary | ICD-10-CM

## 2012-07-18 DIAGNOSIS — Z954 Presence of other heart-valve replacement: Secondary | ICD-10-CM

## 2012-08-08 ENCOUNTER — Ambulatory Visit (INDEPENDENT_AMBULATORY_CARE_PROVIDER_SITE_OTHER): Payer: Medicare Other

## 2012-08-08 DIAGNOSIS — Z954 Presence of other heart-valve replacement: Secondary | ICD-10-CM | POA: Diagnosis not present

## 2012-08-08 DIAGNOSIS — I359 Nonrheumatic aortic valve disorder, unspecified: Secondary | ICD-10-CM | POA: Diagnosis not present

## 2012-08-12 ENCOUNTER — Encounter: Payer: Self-pay | Admitting: Family

## 2012-08-23 ENCOUNTER — Telehealth: Payer: Self-pay | Admitting: Family

## 2012-08-23 NOTE — Progress Notes (Signed)
  Subjective:    Patient ID: [name], male    DOB: 08-May-1945, 67 y.o.   MRN: [id number]  HPI    Review of Systems     Objective:   Physical Exam        Assessment & Plan:  Reviewed compliance report for CPAP- he has used 30/30 days with an average daily use of 5 hrs, 59 minutes a day.

## 2012-08-23 NOTE — Telephone Encounter (Signed)
Opened in error

## 2012-09-05 ENCOUNTER — Ambulatory Visit (INDEPENDENT_AMBULATORY_CARE_PROVIDER_SITE_OTHER): Payer: Medicare Other

## 2012-09-05 DIAGNOSIS — I359 Nonrheumatic aortic valve disorder, unspecified: Secondary | ICD-10-CM

## 2012-09-05 DIAGNOSIS — Z954 Presence of other heart-valve replacement: Secondary | ICD-10-CM

## 2012-09-05 LAB — POCT INR: INR: 3

## 2012-09-12 ENCOUNTER — Other Ambulatory Visit (HOSPITAL_BASED_OUTPATIENT_CLINIC_OR_DEPARTMENT_OTHER): Payer: Medicare Other | Admitting: Lab

## 2012-09-12 LAB — CBC WITH DIFFERENTIAL (CANCER CENTER ONLY)
BASO#: 0.1 10*3/uL (ref 0.0–0.2)
Eosinophils Absolute: 0.3 10*3/uL (ref 0.0–0.5)
HCT: 36.3 % — ABNORMAL LOW (ref 38.7–49.9)
LYMPH%: 19.9 % (ref 14.0–48.0)
MCH: 31.7 pg (ref 28.0–33.4)
MCV: 91 fL (ref 82–98)
MONO#: 0.7 10*3/uL (ref 0.1–0.9)
MONO%: 8.4 % (ref 0.0–13.0)
NEUT%: 67.2 % (ref 40.0–80.0)
Platelets: 172 10*3/uL (ref 145–400)
RBC: 3.98 10*6/uL — ABNORMAL LOW (ref 4.20–5.70)
WBC: 8.2 10*3/uL (ref 4.0–10.0)

## 2012-09-13 LAB — FERRITIN CHCC: Ferritin: 64 ng/ml (ref 22–316)

## 2012-09-13 LAB — IRON AND TIBC CHCC
%SAT: 22 % (ref 20–55)
UIBC: 309 ug/dL (ref 117–376)

## 2012-09-19 ENCOUNTER — Other Ambulatory Visit: Payer: Self-pay | Admitting: Family

## 2012-09-19 NOTE — Telephone Encounter (Signed)
Rx request to pharmacy/SLS  

## 2012-10-04 ENCOUNTER — Ambulatory Visit (INDEPENDENT_AMBULATORY_CARE_PROVIDER_SITE_OTHER): Payer: Medicare Other

## 2012-10-04 DIAGNOSIS — I359 Nonrheumatic aortic valve disorder, unspecified: Secondary | ICD-10-CM

## 2012-10-04 DIAGNOSIS — Z954 Presence of other heart-valve replacement: Secondary | ICD-10-CM | POA: Diagnosis not present

## 2012-10-04 LAB — POCT INR: INR: 3

## 2012-10-07 ENCOUNTER — Other Ambulatory Visit: Payer: Self-pay | Admitting: *Deleted

## 2012-10-07 MED ORDER — WARFARIN SODIUM 5 MG PO TABS: ORAL_TABLET | ORAL | Status: DC

## 2012-10-07 NOTE — Telephone Encounter (Signed)
Called and pt states he is out of town and did not take enough coumadin tablets with him. Ordered him 10 tablets at [facility] in [place] and pt also states that does not have enough Lipitor with him as well instructed to call DR Jens Som nurse regarding this and he states understanding . Pt called and notified that [employer] had received the refill request on the coumadin.

## 2012-11-01 ENCOUNTER — Ambulatory Visit (INDEPENDENT_AMBULATORY_CARE_PROVIDER_SITE_OTHER): Payer: Medicare Other | Admitting: *Deleted

## 2012-11-01 DIAGNOSIS — I359 Nonrheumatic aortic valve disorder, unspecified: Secondary | ICD-10-CM | POA: Diagnosis not present

## 2012-11-01 DIAGNOSIS — Z954 Presence of other heart-valve replacement: Secondary | ICD-10-CM

## 2012-11-01 LAB — POCT INR: INR: 2.8

## 2012-11-06 ENCOUNTER — Other Ambulatory Visit: Payer: Self-pay | Admitting: Family

## 2012-11-06 NOTE — Telephone Encounter (Signed)
Rx request to pharmacy, #[id number]; PATIENT DUE FOR FOLLOW-UP OFFICE VISIT/SLS    

## 2012-11-28 ENCOUNTER — Other Ambulatory Visit: Payer: Self-pay | Admitting: Family

## 2012-11-28 NOTE — Telephone Encounter (Signed)
Rx request to pharmacy/SLS  

## 2012-12-05 ENCOUNTER — Ambulatory Visit (INDEPENDENT_AMBULATORY_CARE_PROVIDER_SITE_OTHER): Payer: Medicare Other | Admitting: General Practice

## 2012-12-05 ENCOUNTER — Other Ambulatory Visit: Payer: Self-pay

## 2012-12-05 DIAGNOSIS — I359 Nonrheumatic aortic valve disorder, unspecified: Secondary | ICD-10-CM | POA: Diagnosis not present

## 2012-12-05 DIAGNOSIS — Z954 Presence of other heart-valve replacement: Secondary | ICD-10-CM | POA: Diagnosis not present

## 2012-12-05 LAB — POCT INR: INR: 2.6

## 2012-12-06 ENCOUNTER — Ambulatory Visit (HOSPITAL_BASED_OUTPATIENT_CLINIC_OR_DEPARTMENT_OTHER): Payer: Medicare Other

## 2012-12-06 ENCOUNTER — Other Ambulatory Visit: Payer: Medicare Other | Admitting: Lab

## 2012-12-06 ENCOUNTER — Other Ambulatory Visit: Payer: Self-pay | Admitting: *Deleted

## 2012-12-06 ENCOUNTER — Other Ambulatory Visit (HOSPITAL_BASED_OUTPATIENT_CLINIC_OR_DEPARTMENT_OTHER): Payer: Medicare Other | Admitting: Lab

## 2012-12-06 DIAGNOSIS — I25810 Atherosclerosis of coronary artery bypass graft(s) without angina pectoris: Secondary | ICD-10-CM

## 2012-12-06 DIAGNOSIS — C61 Malignant neoplasm of prostate: Secondary | ICD-10-CM

## 2012-12-06 DIAGNOSIS — R197 Diarrhea, unspecified: Secondary | ICD-10-CM

## 2012-12-06 DIAGNOSIS — Z23 Encounter for immunization: Secondary | ICD-10-CM | POA: Diagnosis not present

## 2012-12-06 DIAGNOSIS — E119 Type 2 diabetes mellitus without complications: Secondary | ICD-10-CM

## 2012-12-06 LAB — CBC WITH DIFFERENTIAL (CANCER CENTER ONLY)
BASO#: 0 10*3/uL (ref 0.0–0.2)
EOS%: 2.9 % (ref 0.0–7.0)
Eosinophils Absolute: 0.3 10*3/uL (ref 0.0–0.5)
HCT: 37.3 % — ABNORMAL LOW (ref 38.7–49.9)
HGB: 13.1 g/dL (ref 13.0–17.1)
MCH: 31.3 pg (ref 28.0–33.4)
MCHC: 35.1 g/dL (ref 32.0–35.9)
MCV: 89 fL (ref 82–98)
MONO#: 0.7 10*3/uL (ref 0.1–0.9)
MONO%: 7.5 % (ref 0.0–13.0)
NEUT%: 71.5 % (ref 40.0–80.0)
Platelets: 188 10*3/uL (ref 145–400)
RDW: 13.1 % (ref 11.1–15.7)
WBC: 9.1 10*3/uL (ref 4.0–10.0)

## 2012-12-06 MED ORDER — INFLUENZA VAC SPLIT QUAD 0.5 ML IM SUSP
0.5000 mL | Freq: Once | INTRAMUSCULAR | Status: AC
  Administered 2012-12-06: 0.5 mL via INTRAMUSCULAR
  Filled 2012-12-06: qty 0.5

## 2012-12-09 LAB — FERRITIN CHCC: Ferritin: 87 ng/ml (ref 22–316)

## 2012-12-09 LAB — IRON AND TIBC CHCC
%SAT: 26 % (ref 20–55)
Iron: 112 ug/dL (ref 42–163)
TIBC: 421 ug/dL — ABNORMAL HIGH (ref 202–409)

## 2012-12-10 ENCOUNTER — Telehealth: Payer: Self-pay | Admitting: *Deleted

## 2012-12-10 NOTE — Telephone Encounter (Addendum)
Message copied by Mirian Capuchin on Tue Dec 10, 2012  4:18 PM ------      Message from: Arlan Organ R      Created: Mon Dec 09, 2012  9:30 PM       Call - iron is still below 100 - no phlebotomy needed. Thanks!! Cindee Lame ------This message left on pt's mobile phone.

## 2012-12-12 ENCOUNTER — Encounter: Payer: Self-pay | Admitting: *Deleted

## 2012-12-12 ENCOUNTER — Telehealth: Payer: Self-pay | Admitting: Hematology & Oncology

## 2012-12-12 NOTE — Progress Notes (Signed)
Patient called asking if he needed to come in for MD appt and phlebotomy tomorrow since his labwork was all normal last week.   Dr. Myna Hidalgo said that as long as patient has lab appt every 3 months that he doesn't need to see him unless there are any problems.  Patient reminded that he needs to call our office to set up appt for labwork every 3 months.

## 2012-12-12 NOTE — Telephone Encounter (Signed)
Per RN cx 11-14

## 2012-12-13 ENCOUNTER — Ambulatory Visit: Payer: Medicare Other | Admitting: Hematology & Oncology

## 2012-12-13 ENCOUNTER — Other Ambulatory Visit: Payer: Medicare Other | Admitting: Lab

## 2012-12-18 DIAGNOSIS — E291 Testicular hypofunction: Secondary | ICD-10-CM | POA: Diagnosis not present

## 2012-12-18 DIAGNOSIS — C61 Malignant neoplasm of prostate: Secondary | ICD-10-CM | POA: Diagnosis not present

## 2012-12-20 ENCOUNTER — Other Ambulatory Visit: Payer: Self-pay | Admitting: Family

## 2012-12-20 NOTE — Telephone Encounter (Signed)
Rx request to pharmacy; **PATIENT DUE FOR FOLLOW-UP OFFICE VISIT, NEEDED FOR FUTURE REFILLS**/SLS

## 2013-01-01 ENCOUNTER — Encounter: Payer: Self-pay | Admitting: Cardiology

## 2013-01-01 ENCOUNTER — Ambulatory Visit (INDEPENDENT_AMBULATORY_CARE_PROVIDER_SITE_OTHER): Payer: Medicare Other | Admitting: Cardiology

## 2013-01-01 VITALS — BP 120/82 | HR 64 | Ht 70.0 in | Wt 231.0 lb

## 2013-01-01 DIAGNOSIS — I421 Obstructive hypertrophic cardiomyopathy: Secondary | ICD-10-CM

## 2013-01-01 DIAGNOSIS — I10 Essential (primary) hypertension: Secondary | ICD-10-CM

## 2013-01-01 DIAGNOSIS — I25810 Atherosclerosis of coronary artery bypass graft(s) without angina pectoris: Secondary | ICD-10-CM | POA: Diagnosis not present

## 2013-01-01 DIAGNOSIS — I2510 Atherosclerotic heart disease of native coronary artery without angina pectoris: Secondary | ICD-10-CM | POA: Diagnosis not present

## 2013-01-01 DIAGNOSIS — I359 Nonrheumatic aortic valve disorder, unspecified: Secondary | ICD-10-CM | POA: Diagnosis not present

## 2013-01-01 DIAGNOSIS — E7800 Pure hypercholesterolemia, unspecified: Secondary | ICD-10-CM

## 2013-01-01 NOTE — Assessment & Plan Note (Signed)
Continue statin. Check lipids and liver. 

## 2013-01-01 NOTE — Assessment & Plan Note (Signed)
History of myectomy. Repeat echocardiogram.

## 2013-01-01 NOTE — Assessment & Plan Note (Signed)
Continue statin. Not on aspirin given need for Coumadin. Schedule nuclear study for risk stratification.

## 2013-01-01 NOTE — Progress Notes (Signed)
HPI: 67 year old male previously followed by Dr. Riley Kill for followup of coronary artery disease. Patient is status post coronary artery bypassing graft (LIMA to the LAD, saphenous vein graft to the marginal, saphenous vein graft sequentially to the right coronary artery and distal circumflex) as well as aortic valve replacement (St. Jude aortic valve) in 2005. He also had a septal myectomy at that time. Carotid Dopplers in May of 2013 showed 0-39% bilateral stenosis. Echocardiogram in June of 2011 showed normal LV function, grade 1 diastolic dysfunction, mechanical aortic valve prosthesis with trivial aortic insufficiency. There was systolic anterior motion of the mitral valve. Patient has mild dyspnea on exertion but no orthopnea, PND, syncope or chest pain. Occasional mild pedal edema  Current Outpatient Prescriptions  Medication Sig Dispense Refill  . atorvastatin (LIPITOR) 20 MG tablet TAKE ONE (1) TABLET EACH DAY  30 tablet  0  . BENICAR HCT 20-12.5 MG per tablet TAKE ONE (1) TABLET EACH DAY  30 tablet  0  . glipiZIDE (GLUCOTROL XL) 2.5 MG 24 hr tablet Take 1 tablet (2.5 mg total) by mouth daily.  30 tablet  5  . glucose blood (FREESTYLE LITE) test strip USE AS DIRECTED TO TEST BLOOD SUGAR ONCE DAILY. Dx: 250.00  50 each  5  . JANUVIA 100 MG tablet TAKE ONE (1) TABLET EACH DAY  30 tablet  3  . Lancets (FREESTYLE) lancets Use to check blood sugar once a day as directed.  100 each  1  . metFORMIN (GLUCOPHAGE) 500 MG tablet TAKE TWO TABLETS BY MOUTH TWICE DAILY  120 tablet  0  . metoprolol succinate (TOPROL-XL) 25 MG 24 hr tablet Take 0.5 tablets (12.5 mg total) by mouth daily.  90 tablet  3  . Omega-3 Fatty Acids (FISH OIL) 1000 MG CAPS Take 2 capsules by mouth 2 (two) times daily.        Marland Kitchen warfarin (JANTOVEN) 5 MG tablet Take as directed by [facility] Clinic  10 tablet  0   No current facility-administered medications for this visit.     Past Medical History  Diagnosis Date  .  Allergy   . Hyperlipidemia   . Hypertension   . Cancer 2011    prostate  . Obesity     moderate  . Aortic stenosis   . CAD (coronary artery disease)     cabg  . History of gout   . Heart murmur   . History of hepatitis 1974  . Type II or unspecified type diabetes mellitus without mention of complication, not stated as uncontrolled   . Hemorrhoids   . Sensorineural hearing loss   . Hypertrophic obstructive cardiomyopathy   . Erectile dysfunction   . Hemochromatosis 02/14/2011    Past Surgical History  Procedure Laterality Date  . Prostate seed implant  3/12  . Coronary artery bypass graft  10/2003  . Aortic valve replacement      [employer] [facility]  . Hip surgery  2006    right hip  . Cholecystectomy  1990  . Hernia repair  1999    right, inguinal  . Hernia repair  2002    left, inguinal  . Pilonidal cyst excision  1964  . Tonsillectomy  childhood  . Appendectomy  1990    History   Social History  . Marital Status: Married    Spouse Name: N/A    Number of Children: N/A  . Years of Education: N/A   Occupational History  . Not  on file.   Social History Main Topics  . Smoking status: Former Smoker    Types: Cigars  . Smokeless tobacco: Not on file  . Alcohol Use: Yes     Comment: 4 glasses wine a month  . Drug Use: Not on file  . Sexual Activity: Not on file   Other Topics Concern  . Not on file   Social History Narrative  . No narrative on file    ROS: no fevers or chills, productive cough, hemoptysis, dysphasia, odynophagia, melena, hematochezia, dysuria, hematuria, rash, seizure activity, orthopnea, PND, claudication. Remaining systems are negative.  Physical Exam: Well-developed well-nourished in no acute distress.  Skin is warm and dry.  HEENT is normal.  Neck is supple.  Chest is clear to auscultation with normal expansion.  Cardiovascular exam is regular rate and rhythm. Crisp mechanical valve sounds. 2/6 systolic murmur Abdominal exam  nontender or distended. No masses palpated. Extremities show no edema. neuro grossly intact  ECG sinus rhythm at a rate of 64. Nonspecific ST changes.

## 2013-01-01 NOTE — Assessment & Plan Note (Signed)
Blood pressure controlled. Continue present medications. Check potassium and renal function. 

## 2013-01-01 NOTE — Assessment & Plan Note (Signed)
Continue SBE prophylaxis. Continue Coumadin. Check echocardiogram.

## 2013-01-01 NOTE — Patient Instructions (Signed)
Your physician wants you to follow-up in: 6 MONTHS WITH DR Jens Som You will receive a reminder letter in the mail two months in advance. If you don't receive a letter, please call our office to schedule the follow-up appointment.   Your physician has requested that you have an echocardiogram. Echocardiography is a painless test that uses sound waves to create images of your heart. It provides your doctor with information about the size and shape of your heart and how well your heart's chambers and valves are working. This procedure takes approximately one hour. There are no restrictions for this procedure.   Your physician has requested that you have en exercise stress myoview. For further information please visit [email or web address] Please follow instruction sheet, as given.   Your physician recommends that you return for lab work WITH STRESS TEST

## 2013-01-02 ENCOUNTER — Other Ambulatory Visit: Payer: Self-pay | Admitting: Family

## 2013-01-02 NOTE — Telephone Encounter (Signed)
Rx request to pharmacy; *PATIENT DUE FOR FOLLOW-UP OFFICE VISIT*/SLS    

## 2013-01-22 ENCOUNTER — Other Ambulatory Visit: Payer: Self-pay | Admitting: Family

## 2013-01-22 NOTE — Telephone Encounter (Signed)
Rx request to pharmacy, Office Visit Needed Prior to Future Refills, 30-day supply Only/SLS  

## 2013-02-01 ENCOUNTER — Encounter: Payer: Self-pay | Admitting: Family

## 2013-02-01 ENCOUNTER — Encounter: Payer: Self-pay | Admitting: Cardiology

## 2013-02-03 NOTE — Telephone Encounter (Signed)
Pt is past due for follow up. OK to send 1 month of all meds except Jantoven which is managed by cardiology/coumadin clinic.  We can discuss potential alternatives to januvia at his follow up visit. If we have samples we can leave them for him.

## 2013-02-03 NOTE — Telephone Encounter (Signed)
[name], please advise if there is a cheaper alternative for Januvia. Also shouldn't Jantoven come from pt's cardiologist? See mychart message above.

## 2013-02-05 ENCOUNTER — Ambulatory Visit (HOSPITAL_COMMUNITY): Payer: Medicare Other | Attending: Cardiology | Admitting: Cardiology

## 2013-02-05 ENCOUNTER — Other Ambulatory Visit (INDEPENDENT_AMBULATORY_CARE_PROVIDER_SITE_OTHER): Payer: Medicare Other

## 2013-02-05 ENCOUNTER — Ambulatory Visit (HOSPITAL_COMMUNITY): Payer: Medicare Other | Attending: Cardiology | Admitting: Radiology

## 2013-02-05 ENCOUNTER — Ambulatory Visit (INDEPENDENT_AMBULATORY_CARE_PROVIDER_SITE_OTHER): Payer: Medicare Other | Admitting: Pharmacist

## 2013-02-05 ENCOUNTER — Encounter: Payer: Self-pay | Admitting: Cardiovascular Disease

## 2013-02-05 VITALS — BP 154/85 | HR 65 | Ht 70.0 in | Wt 227.0 lb

## 2013-02-05 DIAGNOSIS — I359 Nonrheumatic aortic valve disorder, unspecified: Secondary | ICD-10-CM

## 2013-02-05 DIAGNOSIS — I379 Nonrheumatic pulmonary valve disorder, unspecified: Secondary | ICD-10-CM | POA: Insufficient documentation

## 2013-02-05 DIAGNOSIS — R0602 Shortness of breath: Secondary | ICD-10-CM

## 2013-02-05 DIAGNOSIS — R0609 Other forms of dyspnea: Secondary | ICD-10-CM | POA: Insufficient documentation

## 2013-02-05 DIAGNOSIS — I10 Essential (primary) hypertension: Secondary | ICD-10-CM | POA: Insufficient documentation

## 2013-02-05 DIAGNOSIS — Z954 Presence of other heart-valve replacement: Secondary | ICD-10-CM | POA: Diagnosis not present

## 2013-02-05 DIAGNOSIS — Z87891 Personal history of nicotine dependence: Secondary | ICD-10-CM | POA: Insufficient documentation

## 2013-02-05 DIAGNOSIS — R011 Cardiac murmur, unspecified: Secondary | ICD-10-CM | POA: Insufficient documentation

## 2013-02-05 DIAGNOSIS — I25810 Atherosclerosis of coronary artery bypass graft(s) without angina pectoris: Secondary | ICD-10-CM | POA: Insufficient documentation

## 2013-02-05 DIAGNOSIS — E785 Hyperlipidemia, unspecified: Secondary | ICD-10-CM | POA: Insufficient documentation

## 2013-02-05 DIAGNOSIS — R0989 Other specified symptoms and signs involving the circulatory and respiratory systems: Secondary | ICD-10-CM | POA: Insufficient documentation

## 2013-02-05 DIAGNOSIS — I2510 Atherosclerotic heart disease of native coronary artery without angina pectoris: Secondary | ICD-10-CM | POA: Diagnosis not present

## 2013-02-05 DIAGNOSIS — I079 Rheumatic tricuspid valve disease, unspecified: Secondary | ICD-10-CM | POA: Insufficient documentation

## 2013-02-05 DIAGNOSIS — E119 Type 2 diabetes mellitus without complications: Secondary | ICD-10-CM | POA: Insufficient documentation

## 2013-02-05 DIAGNOSIS — E669 Obesity, unspecified: Secondary | ICD-10-CM | POA: Diagnosis not present

## 2013-02-05 LAB — HEPATIC FUNCTION PANEL
ALK PHOS: 62 U/L (ref 39–117)
ALT: 44 U/L (ref 0–53)
AST: 39 U/L — ABNORMAL HIGH (ref 0–37)
Albumin: 4.1 g/dL (ref 3.5–5.2)
BILIRUBIN DIRECT: 0.1 mg/dL (ref 0.0–0.3)
BILIRUBIN TOTAL: 0.7 mg/dL (ref 0.3–1.2)
Total Protein: 7.8 g/dL (ref 6.0–8.3)

## 2013-02-05 LAB — BASIC METABOLIC PANEL
BUN: 27 mg/dL — AB (ref 6–23)
CO2: 25 meq/L (ref 19–32)
CREATININE: 1.1 mg/dL (ref 0.4–1.5)
Calcium: 9 mg/dL (ref 8.4–10.5)
Chloride: 103 mEq/L (ref 96–112)
GFR: 70.78 mL/min (ref >60.00)
GLUCOSE: 131 mg/dL — AB (ref 70–99)
Potassium: 4 mEq/L (ref 3.5–5.1)
Sodium: 137 mEq/L (ref 135–145)

## 2013-02-05 LAB — LIPID PANEL
CHOL/HDL RATIO: 4
Cholesterol: 123 mg/dL (ref 0–200)
HDL: 28.2 mg/dL — ABNORMAL LOW (ref >39.00)
Triglycerides: 217 mg/dL — ABNORMAL HIGH (ref 0.0–149.0)
VLDL: 43.4 mg/dL — AB (ref 0.0–40.0)

## 2013-02-05 LAB — POCT INR: INR: 1.7

## 2013-02-05 LAB — LDL CHOLESTEROL, DIRECT: Direct LDL: 57.8 mg/dL

## 2013-02-05 MED ORDER — TECHNETIUM TC 99M SESTAMIBI GENERIC - CARDIOLITE
30.0000 | Freq: Once | INTRAVENOUS | Status: AC | PRN
  Administered 2013-02-05: 30 via INTRAVENOUS

## 2013-02-05 MED ORDER — TECHNETIUM TC 99M SESTAMIBI GENERIC - CARDIOLITE
10.0000 | Freq: Once | INTRAVENOUS | Status: AC | PRN
  Administered 2013-02-05: 10 via INTRAVENOUS

## 2013-02-05 MED ORDER — ATORVASTATIN CALCIUM 20 MG PO TABS: ORAL_TABLET | ORAL | Status: DC

## 2013-02-05 MED ORDER — OLMESARTAN MEDOXOMIL-HCTZ 20-12.5 MG PO TABS: ORAL_TABLET | ORAL | Status: DC

## 2013-02-05 MED ORDER — SITAGLIPTIN PHOSPHATE 100 MG PO TABS: ORAL_TABLET | ORAL | Status: DC

## 2013-02-05 MED ORDER — METOPROLOL SUCCINATE ER 25 MG PO TB24: 12.5000 mg | ORAL_TABLET | Freq: Every day | ORAL | Status: DC

## 2013-02-05 MED ORDER — GLIPIZIDE ER 2.5 MG PO TB24: ORAL_TABLET | ORAL | Status: DC

## 2013-02-05 NOTE — Progress Notes (Signed)
[facility] [address] [place], [place] [address] [phone]    Cardiology Nuclear Med Study  [name] is a 68 y.o. male     MRN : [id number]     DOB: October 01, 1945  Procedure Date: 02/05/2013  Nuclear Med Background Indication for Stress Test:  Evaluation for Ischemia and Graft Patency History:  CAD, CABG 2005, Echo 2011 EF 55-60%, Heart Murmur, Valve Replacement 2006 Cardiac Risk Factors: History of Smoking, Hypertension, Lipids and NIDDM  Symptoms:  DOE   Nuclear Pre-Procedure Caffeine/Decaff Intake:  None NPO After: 7:00pm   Lungs:  clear O2 Sat: 95% on room air. IV 0.9% NS with Angio Cath:  20g  IV Site: R Antecubital  IV Started by:  Perrin Maltese, EMT-P  Chest Size (in):  44 Cup Size: n/a  Height: 5\' 10"  (1.778 m)  Weight:  227 lb (102.967 kg)  BMI:  Body mass index is 32.57 kg/(m^2). Tech Comments:  No Rx this am. CBG 148 mg/dl    Nuclear Med Study 1 or 2 day study: 1 day  Stress Test Type:  Stress  Reading MD: n/a  Order Authorizing Provider:  B.Crenshaw MD  Resting Radionuclide: Technetium 52m Sestamibi  Resting Radionuclide Dose: 11.0 mCi   Stress Radionuclide:  Technetium 49m Sestamibi  Stress Radionuclide Dose: 33.0 mCi           Stress Protocol Rest HR: 65 Stress HR: 137  Rest BP: 154/85 Stress BP: 213/87  Exercise Time (min): 5:16 METS: 7.0           Dose of Adenosine (mg):  n/a Dose of Lexiscan: n/a mg  Dose of Atropine (mg): n/a Dose of Dobutamine: n/a mcg/kg/min (at max HR)  Stress Test Technologist: Glade Lloyd, BS-ES  Nuclear Technologist:  Charlton Amor, CNMT     Rest Procedure:  Myocardial perfusion imaging was performed at rest 45 minutes following the intravenous administration of Technetium 41m Sestamibi. Rest ECG: NSR with non-specific ST-T wave changes  Stress Procedure:  The patient exercised on the treadmill utilizing the Bruce Protocol for 5:16 minutes. The patient stopped due to fatigue  and denied any chest pain.  Technetium 38m Sestamibi was injected at peak exercise and myocardial perfusion imaging was performed after a brief delay. Stress ECG: Significant ST abnormalities consistent with ischemia.  QPS Raw Data Images:  Patient motion noted. Stress Images:  Normal homogeneous uptake in all areas of the myocardium. Rest Images:  Normal homogeneous uptake in all areas of the myocardium. Subtraction (SDS):  No evidence of ischemia. Transient Ischemic Dilatation (Normal <1.22):  0.92 Lung/Heart Ratio (Normal <0.45):  0.30  Quantitative Gated Spect Images QGS EDV:  99 ml QGS ESV:  43 ml  Impression Exercise Capacity:  Fair exercise capacity. BP Response:  Hypertensive blood pressure response. Clinical Symptoms:  No significant symptoms noted. ECG Impression:  Significant ST abnormalities consistent with ischemia. Comparison with Prior Nuclear Study: No images to compare  Overall Impression:  Low risk stress nuclear study No perfusion defects. Baseline ECG with nonspecific ST/T wwave changes but 1 mm horizontal ST segment depression in lateral leads with stress.  LV Ejection Fraction: 56%.  LV Wall Motion:  EF calculated as normal but visually apical hypokinesis  [name]

## 2013-02-05 NOTE — Progress Notes (Signed)
Echo performed. 

## 2013-02-05 NOTE — Telephone Encounter (Signed)
30 day supply of: atorvastatin, benicar hct, metformin and glipizide sent to pharmacy. Januvia samples left at front desk for pick up. Advised pt to contact cardiology for jantoven refills.

## 2013-02-06 ENCOUNTER — Telehealth: Payer: Self-pay | Admitting: Cardiology

## 2013-02-06 NOTE — Telephone Encounter (Signed)
Left message for pt to call.

## 2013-02-06 NOTE — Telephone Encounter (Signed)
Follow Up ° °Pt returned call for results//SR  °

## 2013-02-10 ENCOUNTER — Encounter: Payer: Self-pay | Admitting: Cardiology

## 2013-02-10 NOTE — Telephone Encounter (Signed)
Spoke with pt, .aware of results.  

## 2013-02-12 ENCOUNTER — Encounter: Payer: Self-pay | Admitting: Family

## 2013-02-12 ENCOUNTER — Ambulatory Visit: Payer: Medicare Other | Admitting: Family

## 2013-02-12 ENCOUNTER — Ambulatory Visit (INDEPENDENT_AMBULATORY_CARE_PROVIDER_SITE_OTHER): Payer: Medicare Other | Admitting: Family

## 2013-02-12 VITALS — BP 104/68 | HR 76 | Temp 97.8°F | Resp 16 | Ht 70.0 in | Wt 229.1 lb

## 2013-02-12 DIAGNOSIS — I10 Essential (primary) hypertension: Secondary | ICD-10-CM

## 2013-02-12 DIAGNOSIS — Z23 Encounter for immunization: Secondary | ICD-10-CM | POA: Diagnosis not present

## 2013-02-12 DIAGNOSIS — G4733 Obstructive sleep apnea (adult) (pediatric): Secondary | ICD-10-CM

## 2013-02-12 DIAGNOSIS — E119 Type 2 diabetes mellitus without complications: Secondary | ICD-10-CM | POA: Diagnosis not present

## 2013-02-12 LAB — HEMOGLOBIN A1C
HEMOGLOBIN A1C: 7.2 % — AB (ref <5.7)
Mean Plasma Glucose: 160 mg/dL — ABNORMAL HIGH (ref <117)

## 2013-02-12 NOTE — Assessment & Plan Note (Signed)
Clinically stable. Agreeable to continue [place]. Obtain follow up A1C and urine microalbumin.

## 2013-02-12 NOTE — Assessment & Plan Note (Signed)
Reinforced importance of compliance with CPAP. Advised pt to let me know if he decides he would like to try full face mask instead of nasal pillows.

## 2013-02-12 NOTE — Progress Notes (Signed)
Pre visit review using our clinic review tool, if applicable. No additional management support is needed unless otherwise documented below in the visit note. 

## 2013-02-12 NOTE — Assessment & Plan Note (Signed)
BP stable on current meds.   

## 2013-02-12 NOTE — Patient Instructions (Signed)
Please complete your lab work prior to leaving. Follow up in 3 months.   

## 2013-02-12 NOTE — Progress Notes (Signed)
Subjective:    Patient ID: [name], male    DOB: [birth year], 68 y.o.   MRN: [id number]  HPI  Mr. Delaluz is a 68 yr old male who presents today for follow up of multiple medical problems:  1) DM2- maintained on reports sugars 130's to 140's.  He continues metformin and glipizide.  Reports last eye exam 2/13- has upcoming appointment.  2) HTN- currently maintained on olmesartan-hctz. He is taking 1/2 tab daily.  3) OSA- on CPAP- reports that he wears every night, but has trouble breathing after a few hours and removes it.  Has nasal pillows.     Review of Systems See HPI  Past Medical History  Diagnosis Date  . Allergy   . Hyperlipidemia   . Hypertension   . Cancer 2011    prostate  . Obesity     moderate  . Aortic stenosis   . CAD (coronary artery disease)     cabg  . History of gout   . Heart murmur   . History of hepatitis 1974  . Type II or unspecified type diabetes mellitus without mention of complication, not stated as uncontrolled   . Hemorrhoids   . Sensorineural hearing loss   . Hypertrophic obstructive cardiomyopathy   . Erectile dysfunction   . Hemochromatosis 02/14/2011    History   Social History  . Marital Status: Married    Spouse Name: N/A    Number of Children: N/A  . Years of Education: N/A   Occupational History  . Not on file.   Social History Main Topics  . Smoking status: Former Smoker    Types: Cigars  . Smokeless tobacco: Not on file  . Alcohol Use: Yes     Comment: 4 glasses wine a month  . Drug Use: Not on file  . Sexual Activity: Not on file   Other Topics Concern  . Not on file   Social History Narrative  . No narrative on file    Past Surgical History  Procedure Laterality Date  . Prostate seed implant  3/12  . Coronary artery bypass graft  10/2003  . Aortic valve replacement      [facility]  . Hip surgery  2006    right hip  . Cholecystectomy  1990  . Hernia repair  1999    right, inguinal  .  Hernia repair  2002    left, inguinal  . Pilonidal cyst excision  1964  . Tonsillectomy  childhood  . Appendectomy  1990    Family History  Problem Relation Age of Onset  . Heart disease Mother   . Stroke Mother   . Heart disease Father   . Coronary artery disease Brother   . Asperger's syndrome Son   . Hyperlipidemia Son   . Cancer Neg Hx     negative for colon cancer    No Known Allergies  Current Outpatient Prescriptions on File Prior to Visit  Medication Sig Dispense Refill  . atorvastatin (LIPITOR) 20 MG tablet TAKE ONE (1) TABLET EACH DAY  30 tablet  0  . glipiZIDE (GLUCOTROL XL) 2.5 MG 24 hr tablet TAKE ONE (1) TABLET EACH DAY  30 tablet  0  . glucose blood (FREESTYLE LITE) test strip USE AS DIRECTED TO TEST BLOOD SUGAR ONCE DAILY. Dx: 250.00  50 each  5  . Lancets (FREESTYLE) lancets Use to check blood sugar once a day as directed.  100 each  1  .  metFORMIN (GLUCOPHAGE) 500 MG tablet TAKE TWO TABLETS BY MOUTH TWICE DAILY  120 tablet  0  . metoprolol succinate (TOPROL-XL) 25 MG 24 hr tablet Take 0.5 tablets (12.5 mg total) by mouth daily.  15 tablet  0  . olmesartan-hydrochlorothiazide (BENICAR HCT) 20-12.5 MG per tablet TAKE ONE (1) TABLET BY MOUTH EVERY DAY  30 tablet  0  . Omega-3 Fatty Acids (FISH OIL) 1000 MG CAPS Take 2 capsules by mouth 2 (two) times daily.        . sitaGLIPtin (JANUVIA) 100 MG tablet TAKE ONE (1) TABLET EACH DAY  28 tablet  0  . warfarin (JANTOVEN) 5 MG tablet Take as directed by Coumadin Clinic  10 tablet  0   No current facility-administered medications on file prior to visit.    BP 104/68  Pulse 76  Temp(Src) 97.8 F (36.6 C) (Oral)  Resp 16  Ht 5\' 10"  (1.778 m)  Wt 229 lb 1.9 oz (103.928 kg)  BMI 32.88 kg/m2  SpO2 99%       Objective:   Physical Exam  Constitutional: He is oriented to person, place, and time. He appears well-developed and well-nourished. No distress.  HENT:  Head: Normocephalic and atraumatic.  Cardiovascular:  Normal rate and regular rhythm.   + click due to mechanical valve  Pulmonary/Chest: Effort normal and breath sounds normal. No respiratory distress. He has no wheezes. He has no rales. He exhibits no tenderness.  Neurological: He is alert and oriented to person, place, and time.  Psychiatric: He has a normal mood and affect. His behavior is normal. Judgment and thought content normal.          Assessment & Plan:

## 2013-02-12 NOTE — Addendum Note (Signed)
Addended by: Kelle Darting A on: 02/12/2013 01:21 PM   Modules accepted: Orders

## 2013-02-13 ENCOUNTER — Telehealth: Payer: Self-pay | Admitting: Family

## 2013-02-13 LAB — MICROALBUMIN / CREATININE URINE RATIO
Creatinine, Urine: 347.7 mg/dL
Microalb Creat Ratio: 10.9 mg/g (ref 0.0–30.0)
Microalb, Ur: 3.79 mg/dL — ABNORMAL HIGH (ref 0.00–1.89)

## 2013-02-13 NOTE — Telephone Encounter (Signed)
Please let pt know that A1C 7.2, above goal. Increase glucotrol to 5mg  (2 tabs) once daily. Call if sugars < 80.   Some protein in urine, likely secondary to diabetes so it is important that we get sugars in tight control. Also work on [employer], exercise, weight loss.

## 2013-02-14 MED ORDER — GLIPIZIDE 5 MG PO TABS: 5.0000 mg | ORAL_TABLET | Freq: Every day | ORAL | Status: DC

## 2013-02-14 NOTE — Addendum Note (Signed)
Addended by: Kelle Darting A on: 02/14/2013 04:06 PM   Modules accepted: Orders

## 2013-02-14 NOTE — Telephone Encounter (Signed)
Rx sent to pharmacy with new strength / directions.

## 2013-02-14 NOTE — Telephone Encounter (Signed)
Left message on home # to return my call. 

## 2013-02-14 NOTE — Telephone Encounter (Signed)
Notified pt and he voices understanding. 

## 2013-02-20 ENCOUNTER — Encounter: Payer: Self-pay | Admitting: Family

## 2013-02-25 ENCOUNTER — Other Ambulatory Visit: Payer: Self-pay | Admitting: Family

## 2013-03-05 ENCOUNTER — Other Ambulatory Visit: Payer: Self-pay | Admitting: Family

## 2013-03-06 ENCOUNTER — Other Ambulatory Visit: Payer: Self-pay

## 2013-03-06 MED ORDER — GLIPIZIDE 5 MG PO TABS: 5.0000 mg | ORAL_TABLET | Freq: Every day | ORAL | Status: DC

## 2013-03-07 ENCOUNTER — Ambulatory Visit (INDEPENDENT_AMBULATORY_CARE_PROVIDER_SITE_OTHER): Payer: Medicare Other

## 2013-03-07 DIAGNOSIS — I359 Nonrheumatic aortic valve disorder, unspecified: Secondary | ICD-10-CM | POA: Diagnosis not present

## 2013-03-07 DIAGNOSIS — Z5181 Encounter for therapeutic drug level monitoring: Secondary | ICD-10-CM | POA: Insufficient documentation

## 2013-03-07 DIAGNOSIS — Z954 Presence of other heart-valve replacement: Secondary | ICD-10-CM | POA: Diagnosis not present

## 2013-03-07 LAB — POCT INR: INR: 2.9

## 2013-03-10 ENCOUNTER — Other Ambulatory Visit: Payer: Self-pay | Admitting: *Deleted

## 2013-03-10 MED ORDER — GLIPIZIDE ER 5 MG PO TB24: 5.0000 mg | ORAL_TABLET | Freq: Every day | ORAL | Status: DC

## 2013-03-10 NOTE — Progress Notes (Signed)
Fax received from pharmacy stating that pt "old dose was 2.5 mg XL. Pt requests new Rx for 5 mg to be XL. Rx sent in on 02.05.15 was for IR". Ok per VO by PCP; Rx request to pharmacy/SLS

## 2013-03-11 ENCOUNTER — Other Ambulatory Visit: Payer: Self-pay | Admitting: *Deleted

## 2013-03-11 ENCOUNTER — Telehealth: Payer: Self-pay | Admitting: Family

## 2013-03-11 ENCOUNTER — Encounter: Payer: Self-pay | Admitting: Family

## 2013-03-11 MED ORDER — WARFARIN SODIUM 5 MG PO TABS: ORAL_TABLET | ORAL | Status: DC

## 2013-03-11 MED ORDER — ATORVASTATIN CALCIUM 20 MG PO TABS: ORAL_TABLET | ORAL | Status: DC

## 2013-03-11 NOTE — Telephone Encounter (Signed)
Refill atorvastatin

## 2013-03-11 NOTE — Telephone Encounter (Signed)
Refill sent.

## 2013-04-01 ENCOUNTER — Encounter: Payer: Self-pay | Admitting: Family

## 2013-04-01 ENCOUNTER — Other Ambulatory Visit: Payer: Self-pay | Admitting: Family

## 2013-04-01 DIAGNOSIS — E119 Type 2 diabetes mellitus without complications: Secondary | ICD-10-CM | POA: Diagnosis not present

## 2013-04-04 ENCOUNTER — Ambulatory Visit (INDEPENDENT_AMBULATORY_CARE_PROVIDER_SITE_OTHER): Payer: Medicare Other

## 2013-04-04 DIAGNOSIS — Z954 Presence of other heart-valve replacement: Secondary | ICD-10-CM | POA: Diagnosis not present

## 2013-04-04 DIAGNOSIS — Z5181 Encounter for therapeutic drug level monitoring: Secondary | ICD-10-CM | POA: Diagnosis not present

## 2013-04-04 DIAGNOSIS — I359 Nonrheumatic aortic valve disorder, unspecified: Secondary | ICD-10-CM | POA: Diagnosis not present

## 2013-04-04 LAB — POCT INR: INR: 2.2

## 2013-04-22 ENCOUNTER — Encounter: Payer: Self-pay | Admitting: Family

## 2013-04-29 ENCOUNTER — Encounter: Payer: Self-pay | Admitting: Family

## 2013-05-01 ENCOUNTER — Ambulatory Visit (INDEPENDENT_AMBULATORY_CARE_PROVIDER_SITE_OTHER): Payer: Medicare Other | Admitting: Pharmacist Clinician (PhC)/ Clinical Pharmacy Specialist

## 2013-05-01 DIAGNOSIS — I359 Nonrheumatic aortic valve disorder, unspecified: Secondary | ICD-10-CM

## 2013-05-01 DIAGNOSIS — Z954 Presence of other heart-valve replacement: Secondary | ICD-10-CM | POA: Diagnosis not present

## 2013-05-01 DIAGNOSIS — Z5181 Encounter for therapeutic drug level monitoring: Secondary | ICD-10-CM | POA: Diagnosis not present

## 2013-05-01 LAB — POCT INR: INR: 2.3

## 2013-05-13 ENCOUNTER — Encounter: Payer: Self-pay | Admitting: Family

## 2013-05-13 ENCOUNTER — Ambulatory Visit (INDEPENDENT_AMBULATORY_CARE_PROVIDER_SITE_OTHER): Payer: Medicare Other | Admitting: Family

## 2013-05-13 VITALS — BP 122/76 | HR 69 | Temp 98.6°F | Resp 16 | Ht 70.0 in | Wt 229.1 lb

## 2013-05-13 DIAGNOSIS — I25810 Atherosclerosis of coronary artery bypass graft(s) without angina pectoris: Secondary | ICD-10-CM | POA: Diagnosis not present

## 2013-05-13 DIAGNOSIS — G4733 Obstructive sleep apnea (adult) (pediatric): Secondary | ICD-10-CM

## 2013-05-13 DIAGNOSIS — E119 Type 2 diabetes mellitus without complications: Secondary | ICD-10-CM | POA: Diagnosis not present

## 2013-05-13 DIAGNOSIS — I10 Essential (primary) hypertension: Secondary | ICD-10-CM

## 2013-05-13 NOTE — Progress Notes (Signed)
Subjective:    Patient ID: [name], male    DOB: 03/03/1945, 68 y.o.   MRN: [id number]  HPI  [name] is a 68 yr old male who presents today for follow up.  1) DM2-  Currently maintained on metformin, glipizide and Januvia. Reports that his sugars are improved on glipizide. Lab Results  Component Value Date   HGBA1C 7.2* 02/12/2013   2) HTN- current blood pressure meds include  metoprolol and benicar hct.   BP Readings from Last 3 Encounters:  05/13/13 122/76  02/12/13 104/68  02/05/13 154/85   3) OSA- has not been wearing lately. Reports that 1 nostril closes up while sleeping and he falls back to sleep.    Review of Systems See HPI  Past Medical History  Diagnosis Date  . Allergy   . Hyperlipidemia   . Hypertension   . Cancer 2011    prostate  . Obesity     moderate  . Aortic stenosis   . CAD (coronary artery disease)     cabg  . History of gout   . Heart murmur   . History of hepatitis 1974  . Type II or unspecified type diabetes mellitus without mention of complication, not stated as uncontrolled   . Hemorrhoids   . Sensorineural hearing loss   . Hypertrophic obstructive cardiomyopathy   . Erectile dysfunction   . Hemochromatosis 02/14/2011    History   Social History  . Marital Status: Married    Spouse Name: N/A    Number of Children: N/A  . Years of Education: N/A   Occupational History  . Not on file.   Social History Main Topics  . Smoking status: Former Smoker    Types: Cigars  . Smokeless tobacco: Not on file  . Alcohol Use: Yes     Comment: 4 glasses wine a month  . Drug Use: Not on file  . Sexual Activity: Not on file   Other Topics Concern  . Not on file   Social History Narrative  . No narrative on file    Past Surgical History  Procedure Laterality Date  . Prostate seed implant  3/12  . Coronary artery bypass graft  10/2003  . Aortic valve replacement      [facility]  . Hip surgery  2006    right hip  .  Cholecystectomy  1990  . Hernia repair  1999    right, inguinal  . Hernia repair  2002    left, inguinal  . Pilonidal cyst excision  1964  . Tonsillectomy  childhood  . Appendectomy  1990    Family History  Problem Relation Age of Onset  . Heart disease Mother   . Stroke Mother   . Heart disease Father   . Coronary artery disease Brother   . Asperger's syndrome Son   . Hyperlipidemia Son   . Cancer Neg Hx     negative for colon cancer    No Known Allergies  Current Outpatient Prescriptions on File Prior to Visit  Medication Sig Dispense Refill  . atorvastatin (LIPITOR) 20 MG tablet TAKE ONE (1) TABLET EACH DAY  30 tablet  2  . glipiZIDE (GLUCOTROL XL) 5 MG 24 hr tablet Take 1 tablet (5 mg total) by mouth daily with breakfast.  30 tablet  2  . glucose blood (FREESTYLE LITE) test strip USE AS DIRECTED TO TEST BLOOD SUGAR ONCE DAILY. Dx: 250.00  50 each  5  .  JANUVIA 100 MG tablet TAKE ONE (1) TABLET BY MOUTH EVERY DAY  30 tablet  3  . Lancets (FREESTYLE) lancets Use to check blood sugar once a day as directed.  100 each  1  . metFORMIN (GLUCOPHAGE) 500 MG tablet TAKE TWO TABLETS BY MOUTH TWICE DAILY  120 tablet  3  . metoprolol succinate (TOPROL-XL) 25 MG 24 hr tablet TAKE 1/2 TABLET EVERY DAY  15 tablet  3  . olmesartan-hydrochlorothiazide (BENICAR HCT) 20-12.5 MG per tablet 0.5 tablets. TAKE ONE (1) TABLET BY MOUTH EVERY DAY      . Omega-3 Fatty Acids (FISH OIL) 1000 MG CAPS Take 2 capsules by mouth 2 (two) times daily.        Marland Kitchen warfarin (JANTOVEN) 5 MG tablet Take as directed by [facility]  50 tablet  3   No current facility-administered medications on file prior to visit.    BP 122/76  Pulse 69  Temp(Src) 98.6 F (37 C) (Oral)  Resp 16  Ht 5\' 10"  (1.778 m)  Wt 229 lb 1.9 oz (103.928 kg)  BMI 32.88 kg/m2  SpO2 99%       Objective:   Physical Exam  Constitutional: He is oriented to person, place, and time. He appears well-developed and well-nourished. No  distress.  HENT:  Head: Normocephalic and atraumatic.  Cardiovascular: Normal rate and regular rhythm.   No murmur heard. Pulmonary/Chest: Effort normal and breath sounds normal. No respiratory distress. He has no wheezes. He has no rales. He exhibits no tenderness.  Neurological: He is alert and oriented to person, place, and time.  Psychiatric: He has a normal mood and affect. His behavior is normal. Judgment and thought content normal.          Assessment & Plan:

## 2013-05-13 NOTE — Patient Instructions (Signed)
Follow up in 3 months

## 2013-05-13 NOTE — Progress Notes (Signed)
Pre visit review using our clinic review tool, if applicable. No additional management support is needed unless otherwise documented below in the visit note. 

## 2013-05-13 NOTE — Assessment & Plan Note (Signed)
BP is stable. Continue current meds.  °

## 2013-05-13 NOTE — Assessment & Plan Note (Signed)
Stable. Continue metformin, januvia and glipizide.

## 2013-05-13 NOTE — Assessment & Plan Note (Signed)
Recommended trial of breath right strips. Reinforced importance of compliance.

## 2013-05-16 ENCOUNTER — Telehealth: Payer: Self-pay | Admitting: *Deleted

## 2013-05-16 NOTE — Telephone Encounter (Signed)
Received fax from Carlena [facility]orn[place], [place] requesting medical clearance for upcoming procedure requiring Conscious sedation / IV.  Form forwarded to Provider for completion. Fax) [phone].

## 2013-05-16 NOTE — Telephone Encounter (Signed)
Form faxed to below #. 

## 2013-05-22 DIAGNOSIS — E119 Type 2 diabetes mellitus without complications: Secondary | ICD-10-CM | POA: Diagnosis not present

## 2013-05-22 LAB — BASIC METABOLIC PANEL
BUN: 20 mg/dL (ref 6–23)
CALCIUM: 9 mg/dL (ref 8.4–10.5)
CHLORIDE: 102 meq/L (ref 96–112)
CO2: 25 mEq/L (ref 19–32)
CREATININE: 1.12 mg/dL (ref 0.50–1.35)
Glucose, Bld: 137 mg/dL — ABNORMAL HIGH (ref 70–99)
Potassium: 4.4 mEq/L (ref 3.5–5.3)
Sodium: 136 mEq/L (ref 135–145)

## 2013-05-22 LAB — HEMOGLOBIN A1C
Hgb A1c MFr Bld: 6.9 % — ABNORMAL HIGH (ref <5.7)
Mean Plasma Glucose: 151 mg/dL — ABNORMAL HIGH (ref <117)

## 2013-05-23 ENCOUNTER — Encounter: Payer: Self-pay | Admitting: Family

## 2013-05-27 ENCOUNTER — Telehealth: Payer: Self-pay

## 2013-05-27 NOTE — Telephone Encounter (Signed)
Relevant patient education assigned to patient using Emmi. ° °

## 2013-05-29 ENCOUNTER — Telehealth: Payer: Self-pay | Admitting: Physician Assistant

## 2013-05-29 ENCOUNTER — Ambulatory Visit (INDEPENDENT_AMBULATORY_CARE_PROVIDER_SITE_OTHER): Payer: Medicare Other | Admitting: Physician Assistant

## 2013-05-29 ENCOUNTER — Encounter (HOSPITAL_BASED_OUTPATIENT_CLINIC_OR_DEPARTMENT_OTHER): Payer: Self-pay

## 2013-05-29 ENCOUNTER — Ambulatory Visit (HOSPITAL_BASED_OUTPATIENT_CLINIC_OR_DEPARTMENT_OTHER)
Admission: RE | Admit: 2013-05-29 | Discharge: 2013-05-29 | Disposition: A | Discharge status: Home / Self Care | Payer: Medicare Other | Source: Ambulatory Visit | Attending: Physician Assistant | Admitting: Physician Assistant

## 2013-05-29 ENCOUNTER — Encounter: Payer: Self-pay | Admitting: Physician Assistant

## 2013-05-29 VITALS — BP 116/68 | HR 72 | Temp 98.0°F | Resp 16 | Ht 70.0 in | Wt 227.5 lb

## 2013-05-29 DIAGNOSIS — R8290 Unspecified abnormal findings in urine: Secondary | ICD-10-CM

## 2013-05-29 DIAGNOSIS — R5383 Other fatigue: Secondary | ICD-10-CM | POA: Diagnosis not present

## 2013-05-29 DIAGNOSIS — N1330 Unspecified hydronephrosis: Secondary | ICD-10-CM | POA: Diagnosis not present

## 2013-05-29 DIAGNOSIS — R82998 Other abnormal findings in urine: Secondary | ICD-10-CM

## 2013-05-29 DIAGNOSIS — I25810 Atherosclerosis of coronary artery bypass graft(s) without angina pectoris: Secondary | ICD-10-CM | POA: Diagnosis not present

## 2013-05-29 DIAGNOSIS — R5381 Other malaise: Secondary | ICD-10-CM

## 2013-05-29 DIAGNOSIS — N201 Calculus of ureter: Secondary | ICD-10-CM | POA: Insufficient documentation

## 2013-05-29 DIAGNOSIS — R319 Hematuria, unspecified: Secondary | ICD-10-CM | POA: Diagnosis not present

## 2013-05-29 DIAGNOSIS — N200 Calculus of kidney: Secondary | ICD-10-CM

## 2013-05-29 DIAGNOSIS — R310 Gross hematuria: Secondary | ICD-10-CM | POA: Diagnosis not present

## 2013-05-29 LAB — BASIC METABOLIC PANEL
BUN: 28 mg/dL — ABNORMAL HIGH (ref 6–23)
CHLORIDE: 101 meq/L (ref 96–112)
CO2: 22 mEq/L (ref 19–32)
Calcium: 9.4 mg/dL (ref 8.4–10.5)
Creat: 1.18 mg/dL (ref 0.50–1.35)
GLUCOSE: 150 mg/dL — AB (ref 70–99)
POTASSIUM: 4 meq/L (ref 3.5–5.3)
SODIUM: 137 meq/L (ref 135–145)

## 2013-05-29 LAB — POCT URINALYSIS DIPSTICK
Glucose, UA: NEGATIVE
Nitrite, UA: NEGATIVE
Urobilinogen, UA: 0.2
pH, UA: 5

## 2013-05-29 LAB — CBC WITH DIFFERENTIAL/PLATELET
Basophils Absolute: 0.1 10*3/uL (ref 0.0–0.1)
Basophils Relative: 1 % (ref 0–1)
Eosinophils Absolute: 0.3 10*3/uL (ref 0.0–0.7)
Eosinophils Relative: 3 % (ref 0–5)
HCT: 38.9 % — ABNORMAL LOW (ref 39.0–52.0)
HEMOGLOBIN: 13.8 g/dL (ref 13.0–17.0)
LYMPHS ABS: 1.5 10*3/uL (ref 0.7–4.0)
Lymphocytes Relative: 17 % (ref 12–46)
MCH: 31.2 pg (ref 26.0–34.0)
MCHC: 35.5 g/dL (ref 30.0–36.0)
MCV: 88 fL (ref 78.0–100.0)
MONOS PCT: 6 % (ref 3–12)
Monocytes Absolute: 0.5 10*3/uL (ref 0.1–1.0)
NEUTROS ABS: 6.4 10*3/uL (ref 1.7–7.7)
NEUTROS PCT: 73 % (ref 43–77)
Platelets: 222 10*3/uL (ref 150–400)
RBC: 4.42 MIL/uL (ref 4.22–5.81)
RDW: 14.3 % (ref 11.5–15.5)
WBC: 8.8 10*3/uL (ref 4.0–10.5)

## 2013-05-29 LAB — PROTIME-INR
INR: 1.76 — ABNORMAL HIGH (ref <1.50)
Prothrombin Time: 20.2 seconds — ABNORMAL HIGH (ref 11.6–15.2)

## 2013-05-29 MED ORDER — IOHEXOL 300 MG/ML  SOLN
125.0000 mL | Freq: Once | INTRAMUSCULAR | Status: AC | PRN
  Administered 2013-05-29: 125 mL via INTRAVENOUS

## 2013-05-29 MED ORDER — OXYCODONE HCL 5 MG PO TABS: 5.0000 mg | ORAL_TABLET | Freq: Four times a day (QID) | ORAL | Status: DC | PRN

## 2013-05-29 NOTE — Assessment & Plan Note (Signed)
Painless.  Physical exam unremarkable.  Patient with hx of smoking and prostate carcinoma.  UA with blood and trace leukocytes.  Will send for culture.  Will obtain CBC, BMP, PT/INR (due to warfarin use).  Will obtain CT Abdomen to rule out stone/mass.  Patient has been scheduled with his Urologist.  Alarm signs/symptoms reviewed with patient.  Patient aware when to proceed to ER or RTC if indicated.

## 2013-05-29 NOTE — Patient Instructions (Signed)
Please obtain labs.  I will call you with your results.  You will be scheduled for a CT scan by [facility].  Please see her before you leave today.  You will need to see Dr. Diona Fanti today/tomorrow.  The front desk is scheduling this appointment for you.  If you develop lightheadedness, dizziness or shortness of breath, proceed to ER or call 911.

## 2013-05-29 NOTE — Telephone Encounter (Signed)
CT scan shows stone in his left ureter with some evidence of obstruction.  He needs to stay well-hydrated.  Follow-up with Urologist for further evaluation and treatment.  I have an RX for Oxycodone here for him to take if needed, if pain develops.  Should not interfere with coumadin.

## 2013-05-29 NOTE — Progress Notes (Signed)
Pre visit review using our clinic review tool, if applicable. No additional management support is needed unless otherwise documented below in the visit note/SLS  

## 2013-05-29 NOTE — Progress Notes (Signed)
Patient presents to clinic today c/o painless gross hematuria x 3 days.  Patient denies fever, chills, nausea, vomiting, flank pain, abdominal pain, lightheadedness or dizziness.  Patient is a former smoker.  Denies heavy lifting.  Denies urinary urgency, frequency, dysuria.  Denies clot in urine.  States blood is present throughout entire urinary stream.  Patient is followed by Urology for hx of prostate cancer that is in remission.  Patient is also on coumadin daily.  Last INR ~ 1 month ago is 2.2.   Past Medical History  Diagnosis Date  . Allergy   . Hyperlipidemia   . Hypertension   . Cancer 2011    prostate  . Obesity     moderate  . Aortic stenosis   . CAD (coronary artery disease)     cabg  . History of gout   . Heart murmur   . History of hepatitis 1974  . Type II or unspecified type diabetes mellitus without mention of complication, not stated as uncontrolled   . Hemorrhoids   . Sensorineural hearing loss   . Hypertrophic obstructive cardiomyopathy   . Erectile dysfunction   . Hemochromatosis 02/14/2011    Current Outpatient Prescriptions on File Prior to Visit  Medication Sig Dispense Refill  . atorvastatin (LIPITOR) 20 MG tablet TAKE ONE (1) TABLET EACH DAY  30 tablet  2  . glipiZIDE (GLUCOTROL XL) 5 MG 24 hr tablet Take 1 tablet (5 mg total) by mouth daily with breakfast.  30 tablet  2  . glucose blood (FREESTYLE LITE) test strip USE AS DIRECTED TO TEST BLOOD SUGAR ONCE DAILY. Dx: 250.00  50 each  5  . JANUVIA 100 MG tablet TAKE ONE (1) TABLET BY MOUTH EVERY DAY  30 tablet  3  . Lancets (FREESTYLE) lancets Use to check blood sugar once a day as directed.  100 each  1  . metFORMIN (GLUCOPHAGE) 500 MG tablet TAKE TWO TABLETS BY MOUTH TWICE DAILY  120 tablet  3  . metoprolol succinate (TOPROL-XL) 25 MG 24 hr tablet TAKE 1/2 TABLET EVERY DAY  15 tablet  3  . olmesartan-hydrochlorothiazide (BENICAR HCT) 20-12.5 MG per tablet 0.5 tablets. TAKE ONE (1) TABLET BY MOUTH EVERY DAY       . Omega-3 Fatty Acids (FISH OIL) 1000 MG CAPS Take 2 capsules by mouth 2 (two) times daily.        Marland Kitchen warfarin (JANTOVEN) 5 MG tablet Take as directed by [facility]  50 tablet  3   No current facility-administered medications on file prior to visit.    No Known Allergies  Family History  Problem Relation Age of Onset  . Heart disease Mother   . Stroke Mother   . Heart disease Father   . Coronary artery disease Brother   . Asperger's syndrome Son   . Hyperlipidemia Son   . Cancer Neg Hx     negative for colon cancer    History   Social History  . Marital Status: Married    Spouse Name: N/A    Number of Children: N/A  . Years of Education: N/A   Social History Main Topics  . Smoking status: Former Smoker    Types: Cigars  . Smokeless tobacco: None  . Alcohol Use: Yes     Comment: 4 glasses wine a month  . Drug Use: None  . Sexual Activity: None   Other Topics Concern  . None   Social History Narrative  . None   Review  of Systems - See HPI.  All other ROS are negative.  BP 116/68  Pulse 72  Temp(Src) 98 F (36.7 C) (Oral)  Resp 16  Ht 5\' 10"  (1.778 m)  Wt 227 lb 8 oz (103.193 kg)  BMI 32.64 kg/m2  SpO2 98%  Physical Exam  Vitals reviewed. Constitutional: He is oriented to person, place, and time and well-developed, well-nourished, and in no distress.  HENT:  Head: Normocephalic and atraumatic.  Eyes: Conjunctivae are normal. Pupils are equal, round, and reactive to light.  Neck: Neck supple.  Cardiovascular: Normal rate, regular rhythm, normal heart sounds and intact distal pulses.   Pulmonary/Chest: Effort normal. No respiratory distress. He has no wheezes. He has no rales. He exhibits no tenderness.  Abdominal: Soft. Bowel sounds are normal. He exhibits no distension and no mass. There is no tenderness. There is no rebound and no guarding.  Negative CVA tenderness.  Lymphadenopathy:    He has no cervical adenopathy.  Neurological: He is alert  and oriented to person, place, and time.  Skin: Skin is warm and dry. No rash noted.  Psychiatric: Affect normal.   Recent Results (from the past 2160 hour(s))  POCT INR     Status: None   Collection Time    03/07/13 12:41 PM      Result Value Ref Range   INR 2.9    POCT INR     Status: None   Collection Time    04/04/13 12:28 PM      Result Value Ref Range   INR 2.2    POCT INR     Status: None   Collection Time    05/01/13  2:08 PM      Result Value Ref Range   INR 2.3    BASIC METABOLIC PANEL     Status: Abnormal   Collection Time    05/22/13  9:00 AM      Result Value Ref Range   Sodium 136  135 - 145 mEq/L   Potassium 4.4  3.5 - 5.3 mEq/L   Chloride 102  96 - 112 mEq/L   CO2 25  19 - 32 mEq/L   Glucose, Bld 137 (*) 70 - 99 mg/dL   BUN 20  6 - 23 mg/dL   Creat 1.12  0.50 - 1.35 mg/dL   Calcium 9.0  8.4 - 10.5 mg/dL  HEMOGLOBIN A1C     Status: Abnormal   Collection Time    05/22/13  9:00 AM      Result Value Ref Range   Hemoglobin A1C 6.9 (*) <5.7 %   Comment:                                                                            According to the ADA Clinical Practice Recommendations for 2011, when     HbA1c is used as a screening test:             >=6.5%   Diagnostic of Diabetes Mellitus                (if abnormal result is confirmed)           5.7-6.4%   Increased risk of developing  Diabetes Mellitus           References:Diagnosis and Classification of Diabetes Mellitus,Diabetes     VZDG,3875,64(PPIRJ 1):S62-S69 and Standards of Medical Care in             Diabetes - 2011,Diabetes JOAC,1660,63 (Suppl 1):S11-S61.         Mean Plasma Glucose 151 (*) <117 mg/dL  POCT URINALYSIS DIPSTICK     Status: Abnormal   Collection Time    05/29/13 11:10 AM      Result Value Ref Range   Color, UA Maroon     Comment: Excessive Hematuria   Clarity, UA Dark     Glucose, UA neg     Bilirubin, UA moderate     Ketones, UA trace     Spec Grav, UA >=1.030     Blood,  UA Large +++     Comment: Excessive   pH, UA 5.0     Protein, UA 100 ++     Urobilinogen, UA 0.2     Nitrite, UA neg     Leukocytes, UA small (1+)     Comment: to Moderate [blood tainted]   Assessment/Plan: Gross hematuria Painless.  Physical exam unremarkable.  Patient with hx of smoking and prostate carcinoma.  UA with blood and trace leukocytes.  Will send for culture.  Will obtain CBC, BMP, PT/INR (due to warfarin use).  Will obtain CT Abdomen to rule out stone/mass.  Patient has been scheduled with his Urologist.  Alarm signs/symptoms reviewed with patient.  Patient aware when to proceed to ER or RTC if indicated.

## 2013-05-29 NOTE — Telephone Encounter (Signed)
LMOM with contact name and number for return call RE: results and further provider instructions [gave vital information in message]/SLS

## 2013-05-29 NOTE — Telephone Encounter (Signed)
Spoke with pt & gave him size of stone, further results & provider instructions; he states he is feeling better & urine has cleared; he will p/u Rx tomorrow in case needed for pain [if stone shifts]/SLS

## 2013-05-30 ENCOUNTER — Ambulatory Visit (INDEPENDENT_AMBULATORY_CARE_PROVIDER_SITE_OTHER): Payer: Medicare Other | Admitting: Cardiology

## 2013-05-30 ENCOUNTER — Telehealth: Payer: Self-pay | Admitting: Cardiology

## 2013-05-30 DIAGNOSIS — Z5181 Encounter for therapeutic drug level monitoring: Secondary | ICD-10-CM

## 2013-05-30 DIAGNOSIS — Z954 Presence of other heart-valve replacement: Secondary | ICD-10-CM

## 2013-05-30 DIAGNOSIS — N201 Calculus of ureter: Secondary | ICD-10-CM | POA: Diagnosis not present

## 2013-05-30 DIAGNOSIS — C61 Malignant neoplasm of prostate: Secondary | ICD-10-CM | POA: Diagnosis not present

## 2013-05-30 LAB — CULTURE, URINE COMPREHENSIVE
Colony Count: NO GROWTH
ORGANISM ID, BACTERIA: NO GROWTH

## 2013-05-30 NOTE — Telephone Encounter (Signed)
New message     Pt has blood in his urine.  [facility] when to [facility] on [address] yesterday.  They checked his INR.  Can you get those results and call him?  Therefore, he will not need to keep his coumadin appt with [name].

## 2013-05-30 NOTE — Telephone Encounter (Signed)
Done see anticoagulation note in Epic.

## 2013-06-02 ENCOUNTER — Encounter: Payer: Self-pay | Admitting: Physician Assistant

## 2013-06-03 ENCOUNTER — Other Ambulatory Visit: Payer: Self-pay | Admitting: Family

## 2013-06-13 ENCOUNTER — Ambulatory Visit (INDEPENDENT_AMBULATORY_CARE_PROVIDER_SITE_OTHER): Payer: Medicare Other | Admitting: *Deleted

## 2013-06-13 DIAGNOSIS — Z5181 Encounter for therapeutic drug level monitoring: Secondary | ICD-10-CM | POA: Diagnosis not present

## 2013-06-13 DIAGNOSIS — Z954 Presence of other heart-valve replacement: Secondary | ICD-10-CM

## 2013-06-13 DIAGNOSIS — I359 Nonrheumatic aortic valve disorder, unspecified: Secondary | ICD-10-CM | POA: Diagnosis not present

## 2013-06-13 LAB — POCT INR: INR: 3.2

## 2013-06-16 ENCOUNTER — Encounter: Payer: Self-pay | Admitting: Family

## 2013-06-27 ENCOUNTER — Ambulatory Visit (INDEPENDENT_AMBULATORY_CARE_PROVIDER_SITE_OTHER): Payer: Medicare Other | Admitting: Pharmacist

## 2013-06-27 DIAGNOSIS — Z954 Presence of other heart-valve replacement: Secondary | ICD-10-CM | POA: Diagnosis not present

## 2013-06-27 DIAGNOSIS — Z5181 Encounter for therapeutic drug level monitoring: Secondary | ICD-10-CM

## 2013-06-27 DIAGNOSIS — C61 Malignant neoplasm of prostate: Secondary | ICD-10-CM | POA: Diagnosis not present

## 2013-06-27 DIAGNOSIS — E291 Testicular hypofunction: Secondary | ICD-10-CM | POA: Diagnosis not present

## 2013-06-27 DIAGNOSIS — I359 Nonrheumatic aortic valve disorder, unspecified: Secondary | ICD-10-CM | POA: Diagnosis not present

## 2013-06-27 DIAGNOSIS — N201 Calculus of ureter: Secondary | ICD-10-CM | POA: Diagnosis not present

## 2013-06-27 LAB — POCT INR: INR: 2.8

## 2013-07-02 ENCOUNTER — Other Ambulatory Visit: Payer: Self-pay | Admitting: Family

## 2013-07-02 NOTE — Telephone Encounter (Signed)
Received refill request for BEnicar HCT from pharmacy. 2 directions are on current med list. 1/2 tablet daily and 1 tablet daily. Left message for pt to call back and verify how he is taking the medication.

## 2013-07-02 NOTE — Telephone Encounter (Signed)
Pt called back and verified that he only takes 1/2 tablet of benicar HCT daily. Refill sent to pharmacy.

## 2013-07-18 ENCOUNTER — Ambulatory Visit (INDEPENDENT_AMBULATORY_CARE_PROVIDER_SITE_OTHER): Payer: Medicare Other | Admitting: *Deleted

## 2013-07-18 DIAGNOSIS — Z954 Presence of other heart-valve replacement: Secondary | ICD-10-CM

## 2013-07-18 DIAGNOSIS — I359 Nonrheumatic aortic valve disorder, unspecified: Secondary | ICD-10-CM | POA: Diagnosis not present

## 2013-07-18 DIAGNOSIS — Z5181 Encounter for therapeutic drug level monitoring: Secondary | ICD-10-CM | POA: Diagnosis not present

## 2013-07-18 LAB — POCT INR: INR: 2.4

## 2013-07-18 MED ORDER — WARFARIN SODIUM 7.5 MG PO TABS: ORAL_TABLET | ORAL | Status: DC

## 2013-07-22 ENCOUNTER — Encounter: Payer: Self-pay | Admitting: *Deleted

## 2013-07-29 ENCOUNTER — Other Ambulatory Visit: Payer: Self-pay | Admitting: Cardiology

## 2013-07-29 ENCOUNTER — Other Ambulatory Visit: Payer: Self-pay | Admitting: Family

## 2013-08-05 ENCOUNTER — Ambulatory Visit (INDEPENDENT_AMBULATORY_CARE_PROVIDER_SITE_OTHER): Payer: Medicare Other | Admitting: Family

## 2013-08-05 ENCOUNTER — Encounter: Payer: Self-pay | Admitting: Family

## 2013-08-05 VITALS — BP 120/70 | HR 64 | Temp 98.2°F | Resp 16 | Ht 70.0 in | Wt 231.0 lb

## 2013-08-05 DIAGNOSIS — M109 Gout, unspecified: Secondary | ICD-10-CM

## 2013-08-05 DIAGNOSIS — I25810 Atherosclerosis of coronary artery bypass graft(s) without angina pectoris: Secondary | ICD-10-CM

## 2013-08-05 MED ORDER — COLCHICINE 0.6 MG PO TABS: ORAL_TABLET | ORAL | Status: DC

## 2013-08-05 NOTE — Progress Notes (Signed)
Subjective:    Patient ID: [name], male    DOB: 12/23/1945, 68 y.o.   MRN: [id number]  HPI  [name] is a 68 yr old male who presents today with chief complaint of foot pain. Reports that symptoms started on Saturday afternoon. Started in the left heel.  Sunday symptoms worsened.    Yesterday he stayed off of his foot.  He has been using ibuprofen with some improvement in his symptoms. Has history of gout.  He reports that symptoms are similar to previous gout flare. Reports + dietary indiscretion.  Review of Systems See HPI  Past Medical History  Diagnosis Date  . Allergy   . Hyperlipidemia   . Hypertension   . Cancer 2011    prostate  . Obesity     moderate  . Aortic stenosis   . CAD (coronary artery disease)     cabg  . History of gout   . Heart murmur   . History of hepatitis 1974  . Type II or unspecified type diabetes mellitus without mention of complication, not stated as uncontrolled   . Hemorrhoids   . Sensorineural hearing loss   . Hypertrophic obstructive cardiomyopathy   . Erectile dysfunction   . Hemochromatosis 02/14/2011    History   Social History  . Marital Status: Married    Spouse Name: N/A    Number of Children: N/A  . Years of Education: N/A   Occupational History  . Not on file.   Social History Main Topics  . Smoking status: Former Smoker    Types: Cigars  . Smokeless tobacco: Not on file  . Alcohol Use: Yes     Comment: 4 glasses wine a month  . Drug Use: Not on file  . Sexual Activity: Not on file   Other Topics Concern  . Not on file   Social History Narrative  . No narrative on file    Past Surgical History  Procedure Laterality Date  . Prostate seed implant  3/12  . Coronary artery bypass graft  10/2003  . Aortic valve replacement      [facility]  . Hip surgery  2006    right hip  . Cholecystectomy  1990  . Hernia repair  1999    right, inguinal  . Hernia repair  2002    left, inguinal  . Pilonidal  cyst excision  1964  . Tonsillectomy  childhood  . Appendectomy  1990    Family History  Problem Relation Age of Onset  . Heart disease Mother   . Stroke Mother   . Heart disease Father   . Coronary artery disease Brother   . Asperger's syndrome Son   . Hyperlipidemia Son   . Cancer Neg Hx     negative for colon cancer    No Known Allergies  Current Outpatient Prescriptions on File Prior to Visit  Medication Sig Dispense Refill  . atorvastatin (LIPITOR) 20 MG tablet TAKE ONE (1) TABLET EACH DAY  30 tablet  2  . GLIPIZIDE XL 5 MG 24 hr tablet TAKE ONE (1) TABLET BY MOUTH EVERY DAY WITH BREAKFAST  30 tablet  2  . glucose blood (FREESTYLE LITE) test strip USE AS DIRECTED TO TEST BLOOD SUGAR ONCE DAILY. Dx: 250.00  50 each  5  . JANUVIA 100 MG tablet TAKE ONE (1) TABLET EACH DAY  30 tablet  3  . Lancets (FREESTYLE) lancets Use to check blood sugar once a day  as directed.  100 each  1  . metFORMIN (GLUCOPHAGE) 500 MG tablet TAKE TWO TABLETS BY MOUTH TWICE DAILY  120 tablet  3  . metoprolol succinate (TOPROL-XL) 25 MG 24 hr tablet TAKE 1/2 TABLET DAILY  15 tablet  3  . olmesartan-hydrochlorothiazide (BENICAR HCT) 20-12.5 MG per tablet Take 0.5 tablets by mouth daily.  15 tablet  2  . Omega-3 Fatty Acids (FISH OIL) 1000 MG CAPS Take 2 capsules by mouth 2 (two) times daily.        Marland Kitchen oxyCODONE (ROXICODONE) 5 MG immediate release tablet Take 1 tablet (5 mg total) by mouth every 6 (six) hours as needed for severe pain.  30 tablet  0  . warfarin (COUMADIN) 7.5 MG tablet Take as directed by coumadin clinic  35 tablet  3   No current facility-administered medications on file prior to visit.    BP 120/70  Pulse 64  Temp(Src) 98.2 F (36.8 C) (Oral)  Resp 16  Ht 5\' 10"  (1.778 m)  Wt 231 lb (104.781 kg)  BMI 33.15 kg/m2  SpO2 97%       Objective:   Physical Exam  Constitutional: He is oriented to person, place, and time. He appears well-developed and well-nourished. No distress.    HENT:  Head: Normocephalic and atraumatic.  Cardiovascular: Normal rate and regular rhythm.   No murmur heard. Pulmonary/Chest: Effort normal and breath sounds normal. No respiratory distress. He has no wheezes. He has no rales. He exhibits no tenderness.  Musculoskeletal:  L foot is swollen.  Mild erythema noted left heel.  Neurological: He is alert and oriented to person, place, and time.          Assessment & Plan:

## 2013-08-05 NOTE — Progress Notes (Signed)
Pre visit review using our clinic review tool, if applicable. No additional management support is needed unless otherwise documented below in the visit note. 

## 2013-08-05 NOTE — Patient Instructions (Signed)
Start colchicine for gout. Stop ibuprofen/motrin, use tylenol as needed for pain (oxycodone if severe). Call if symptoms worsen or if not improved in 2-3 days.

## 2013-08-07 NOTE — Assessment & Plan Note (Signed)
Will rx with colchicine. Pt to call if symptoms worsen or if symptoms do not improve.

## 2013-08-13 ENCOUNTER — Ambulatory Visit (INDEPENDENT_AMBULATORY_CARE_PROVIDER_SITE_OTHER): Payer: Medicare Other | Admitting: Family

## 2013-08-13 ENCOUNTER — Encounter: Payer: Self-pay | Admitting: Family

## 2013-08-13 VITALS — BP 124/80 | HR 65 | Temp 98.0°F | Resp 16 | Ht 70.0 in | Wt 229.0 lb

## 2013-08-13 DIAGNOSIS — I10 Essential (primary) hypertension: Secondary | ICD-10-CM

## 2013-08-13 DIAGNOSIS — F17200 Nicotine dependence, unspecified, uncomplicated: Secondary | ICD-10-CM

## 2013-08-13 DIAGNOSIS — G4733 Obstructive sleep apnea (adult) (pediatric): Secondary | ICD-10-CM | POA: Diagnosis not present

## 2013-08-13 DIAGNOSIS — I25810 Atherosclerosis of coronary artery bypass graft(s) without angina pectoris: Secondary | ICD-10-CM

## 2013-08-13 DIAGNOSIS — E119 Type 2 diabetes mellitus without complications: Secondary | ICD-10-CM

## 2013-08-13 NOTE — Progress Notes (Signed)
Subjective:    Patient ID: [name], male    DOB: 03/16/1945, 68 y.o.   MRN: [id number]  HPI  [name] is a 68 yr old male who presents today for follow up of multiple medical problems:  1) DM2- Last A1C was 6.9.  Reports that in May sugars 120's.    2) HTN- maintained on olmesartan-hctz. He does report some LE edema. BP Readings from Last 3 Encounters:  08/13/13 124/80  08/05/13 120/70  05/29/13 116/68   3) OSA- Reports that he has been compliant with CPAP.  Also using the breath right strips.     Review of Systems See HPI  Past Medical History  Diagnosis Date  . Allergy   . Hyperlipidemia   . Hypertension   . Cancer 2011    prostate  . Obesity     moderate  . Aortic stenosis   . CAD (coronary artery disease)     cabg  . History of gout   . Heart murmur   . History of hepatitis 1974  . Type II or unspecified type diabetes mellitus without mention of complication, not stated as uncontrolled   . Hemorrhoids   . Sensorineural hearing loss   . Hypertrophic obstructive cardiomyopathy   . Erectile dysfunction   . Hemochromatosis 02/14/2011    History   Social History  . Marital Status: Married    Spouse Name: N/A    Number of Children: N/A  . Years of Education: N/A   Occupational History  . Not on file.   Social History Main Topics  . Smoking status: Former Smoker    Types: Cigars  . Smokeless tobacco: Not on file  . Alcohol Use: Yes     Comment: 4 glasses wine a month  . Drug Use: Not on file  . Sexual Activity: Not on file   Other Topics Concern  . Not on file   Social History Narrative  . No narrative on file    Past Surgical History  Procedure Laterality Date  . Prostate seed implant  3/12  . Coronary artery bypass graft  10/2003  . Aortic valve replacement      [facility]  . Hip surgery  2006    right hip  . Cholecystectomy  1990  . Hernia repair  1999    right, inguinal  . Hernia repair  2002    left, inguinal  .  Pilonidal cyst excision  1964  . Tonsillectomy  childhood  . Appendectomy  1990    Family History  Problem Relation Age of Onset  . Heart disease Mother   . Stroke Mother   . Heart disease Father   . Coronary artery disease Brother   . Asperger's syndrome Son   . Hyperlipidemia Son   . Cancer Neg Hx     negative for colon cancer    No Known Allergies  Current Outpatient Prescriptions on File Prior to Visit  Medication Sig Dispense Refill  . atorvastatin (LIPITOR) 20 MG tablet TAKE ONE (1) TABLET EACH DAY  30 tablet  2  . GLIPIZIDE XL 5 MG 24 hr tablet TAKE ONE (1) TABLET BY MOUTH EVERY DAY WITH BREAKFAST  30 tablet  2  . glucose blood (FREESTYLE LITE) test strip USE AS DIRECTED TO TEST BLOOD SUGAR ONCE DAILY. Dx: 250.00  50 each  5  . JANUVIA 100 MG tablet TAKE ONE (1) TABLET EACH DAY  30 tablet  3  . Lancets (FREESTYLE)  lancets Use to check blood sugar once a day as directed.  100 each  1  . metFORMIN (GLUCOPHAGE) 500 MG tablet TAKE TWO TABLETS BY MOUTH TWICE DAILY  120 tablet  3  . metoprolol succinate (TOPROL-XL) 25 MG 24 hr tablet TAKE 1/2 TABLET DAILY  15 tablet  3  . olmesartan-hydrochlorothiazide (BENICAR HCT) 20-12.5 MG per tablet Take 0.5 tablets by mouth daily.  15 tablet  2  . Omega-3 Fatty Acids (FISH OIL) 1000 MG CAPS Take 2 capsules by mouth 2 (two) times daily.        Marland Kitchen warfarin (COUMADIN) 7.5 MG tablet Take as directed by coumadin clinic  35 tablet  3   No current facility-administered medications on file prior to visit.    BP 124/80  Pulse 65  Temp(Src) 98 F (36.7 C) (Oral)  Resp 16  Ht 5\' 10"  (1.778 m)  Wt 229 lb (103.874 kg)  BMI 32.86 kg/m2  SpO2 99%       Objective:   Physical Exam  Constitutional: He is oriented to person, place, and time. He appears well-developed and well-nourished. No distress.  Cardiovascular: Normal rate and regular rhythm.   No murmur heard. Pulmonary/Chest: Effort normal and breath sounds normal. No respiratory  distress. He has no wheezes. He has no rales. He exhibits no tenderness.  Musculoskeletal:  2+ bilateral LE edema  Neurological: He is alert and oriented to person, place, and time.  Psychiatric: He has a normal mood and affect. His behavior is normal. Judgment and thought content normal.          Assessment & Plan:

## 2013-08-13 NOTE — Progress Notes (Signed)
Pre visit review using our clinic review tool, if applicable. No additional management support is needed unless otherwise documented below in the visit note. 

## 2013-08-13 NOTE — Patient Instructions (Signed)
Please return to lab on 7/27//15. Follow up for routine office visit after 10/27.

## 2013-08-14 ENCOUNTER — Telehealth: Payer: Self-pay | Admitting: Family

## 2013-08-14 NOTE — Assessment & Plan Note (Signed)
Clinically stable, obtain a1c.  

## 2013-08-14 NOTE — Telephone Encounter (Signed)
See my chart message

## 2013-08-14 NOTE — Assessment & Plan Note (Signed)
Good compliance.  Continue CPAP.

## 2013-08-14 NOTE — Assessment & Plan Note (Signed)
Reports that he has discontinued cigar use x 1 year. I commended him for this.

## 2013-08-14 NOTE — Assessment & Plan Note (Signed)
Past due for follow up- see mychart message.

## 2013-08-14 NOTE — Assessment & Plan Note (Signed)
BP stable on current meds. Continue same. Will return next week for bmet.

## 2013-08-15 ENCOUNTER — Ambulatory Visit (INDEPENDENT_AMBULATORY_CARE_PROVIDER_SITE_OTHER): Payer: Medicare Other | Admitting: Pharmacist

## 2013-08-15 DIAGNOSIS — I359 Nonrheumatic aortic valve disorder, unspecified: Secondary | ICD-10-CM | POA: Diagnosis not present

## 2013-08-15 DIAGNOSIS — Z954 Presence of other heart-valve replacement: Secondary | ICD-10-CM

## 2013-08-15 DIAGNOSIS — Z5181 Encounter for therapeutic drug level monitoring: Secondary | ICD-10-CM | POA: Diagnosis not present

## 2013-08-15 LAB — POCT INR: INR: 2.2

## 2013-08-16 ENCOUNTER — Encounter: Payer: Self-pay | Admitting: Family

## 2013-08-26 ENCOUNTER — Telehealth: Payer: Self-pay | Admitting: Hematology & Oncology

## 2013-08-26 NOTE — Telephone Encounter (Signed)
Pt came in scheduled 8-25 appointment

## 2013-08-27 ENCOUNTER — Telehealth: Payer: Self-pay | Admitting: *Deleted

## 2013-08-27 DIAGNOSIS — E7800 Pure hypercholesterolemia, unspecified: Secondary | ICD-10-CM

## 2013-08-27 DIAGNOSIS — E119 Type 2 diabetes mellitus without complications: Secondary | ICD-10-CM

## 2013-08-27 NOTE — Telephone Encounter (Signed)
Message copied by Ronny Flurry on Wed Aug 27, 2013 10:40 AM ------      Message from: O'SULLIVAN, [name]      Created: Wed Aug 13, 2013 11:05 AM       Pt will return on 7/27 for bmet and A1C (250.00) and LFT (hyperlipidemia) thanks ------

## 2013-08-27 NOTE — Telephone Encounter (Signed)
Lab orders entered

## 2013-09-01 ENCOUNTER — Other Ambulatory Visit: Payer: Self-pay | Admitting: Family

## 2013-09-11 ENCOUNTER — Other Ambulatory Visit: Payer: Self-pay | Admitting: Family

## 2013-09-12 ENCOUNTER — Encounter: Payer: Self-pay | Admitting: Gastroenterology

## 2013-09-13 ENCOUNTER — Encounter: Payer: Self-pay | Admitting: Family

## 2013-09-22 ENCOUNTER — Other Ambulatory Visit: Payer: Self-pay | Admitting: *Deleted

## 2013-09-22 ENCOUNTER — Encounter: Payer: Self-pay | Admitting: Family

## 2013-09-22 DIAGNOSIS — C61 Malignant neoplasm of prostate: Secondary | ICD-10-CM

## 2013-09-22 NOTE — Telephone Encounter (Signed)
[name] --  Please see copy of lab orders I placed in folder on your desk for pt [name])  Also looks like he may need PPD?  Can this be blood test?  Please advise.

## 2013-09-23 ENCOUNTER — Ambulatory Visit (HOSPITAL_BASED_OUTPATIENT_CLINIC_OR_DEPARTMENT_OTHER): Payer: Medicare Other | Admitting: Hematology & Oncology

## 2013-09-23 ENCOUNTER — Other Ambulatory Visit (HOSPITAL_BASED_OUTPATIENT_CLINIC_OR_DEPARTMENT_OTHER): Payer: Medicare Other | Admitting: Lab

## 2013-09-23 DIAGNOSIS — I25810 Atherosclerosis of coronary artery bypass graft(s) without angina pectoris: Secondary | ICD-10-CM

## 2013-09-23 LAB — CBC WITH DIFFERENTIAL (CANCER CENTER ONLY)
BASO#: 0 10*3/uL (ref 0.0–0.2)
BASO%: 0.5 % (ref 0.0–2.0)
EOS ABS: 0.3 10*3/uL (ref 0.0–0.5)
EOS%: 4 % (ref 0.0–7.0)
HEMATOCRIT: 36.5 % — AB (ref 38.7–49.9)
HGB: 13 g/dL (ref 13.0–17.1)
LYMPH#: 1.4 10*3/uL (ref 0.9–3.3)
LYMPH%: 17.2 % (ref 14.0–48.0)
MCH: 32.1 pg (ref 28.0–33.4)
MCHC: 35.6 g/dL (ref 32.0–35.9)
MCV: 90 fL (ref 82–98)
MONO#: 0.6 10*3/uL (ref 0.1–0.9)
MONO%: 7.2 % (ref 0.0–13.0)
NEUT%: 71.1 % (ref 40.0–80.0)
NEUTROS ABS: 5.6 10*3/uL (ref 1.5–6.5)
Platelets: 183 10*3/uL (ref 145–400)
RBC: 4.05 10*6/uL — AB (ref 4.20–5.70)
RDW: 12.9 % (ref 11.1–15.7)
WBC: 7.9 10*3/uL (ref 4.0–10.0)

## 2013-09-23 LAB — IRON AND TIBC CHCC
%SAT: 23 % (ref 20–55)
Iron: 98 ug/dL (ref 42–163)
TIBC: 418 ug/dL — ABNORMAL HIGH (ref 202–409)
UIBC: 320 ug/dL (ref 117–376)

## 2013-09-23 LAB — FERRITIN CHCC: FERRITIN: 102 ng/mL (ref 22–316)

## 2013-09-23 NOTE — Telephone Encounter (Signed)
See phone noted in [name]'s chart.

## 2013-09-23 NOTE — Progress Notes (Signed)
Hematology and Oncology Follow Up Visit  [name] [id number] July 02, 1945 68 y.o. 09/23/2013   Principle Diagnosis:  1. Hemochromatosis (H63D homozygous mutation). 2. Stage I prostate cancer, status post radiation therapy and     radiation seeds.  Current Therapy:    Phlebotomy to maintain a ferritin less than 100      Interim History:  [name] is back for followup. Last saw him back in May. At that point, he was doing well. His last ferritin was done back in November which was 87. He had a concentration of 26%.  He's had no problems with respect to the hemachromatosis.  He does have diabetes. He does have anticoagulation with Coumadin.  He's had no problems with fever. He's had no rashes. He's had no cough or shortness of breath. There's been no headache.   Medications: Current outpatient prescriptions:atorvastatin (LIPITOR) 20 MG tablet, TAKE ONE (1) TABLET BY MOUTH EVERY DAY, Disp: 30 tablet, Rfl: 2;  GLIPIZIDE XL 5 MG 24 hr tablet, TAKE ONE (1) TABLET BY MOUTH EVERY DAY WITH BREAKFAST, Disp: 30 tablet, Rfl: 2;  glucose blood (FREESTYLE LITE) test strip, USE AS DIRECTED TO TEST BLOOD SUGAR ONCE DAILY. Dx: 250.00, Disp: 50 each, Rfl: 5;  JANUVIA 100 MG tablet, TAKE ONE (1) TABLET EACH DAY, Disp: 30 tablet, Rfl: 3 Lancets (FREESTYLE) lancets, Use to check blood sugar once a day as directed., Disp: 100 each, Rfl: 1;  metFORMIN (GLUCOPHAGE) 500 MG tablet, TAKE TWO TABLETS BY MOUTH TWICE DAILY, Disp: 120 tablet, Rfl: 0;  metoprolol succinate (TOPROL-XL) 25 MG 24 hr tablet, TAKE 1/2 TABLET DAILY, Disp: 15 tablet, Rfl: 3;  olmesartan-hydrochlorothiazide (BENICAR HCT) 20-12.5 MG per tablet, Take 0.5 tablets by mouth daily., Disp: 15 tablet, Rfl: 2 Omega-3 Fatty Acids (FISH OIL) 1000 MG CAPS, Take 2 capsules by mouth 2 (two) times daily.  , Disp: , Rfl: ;  warfarin (COUMADIN) 7.5 MG tablet, Take as directed by coumadin clinic, Disp: 35 tablet, Rfl: 3  Allergies: No Known  Allergies  Past Medical History, Surgical history, Social history, and Family History were reviewed and updated.  Review of Systems: As above  Physical Exam:  height is 5\' 10"  (1.778 m) and weight is 229 lb (103.874 kg). His oral temperature is 97.8 F (36.6 C). His blood pressure is 130/66 and his pulse is 61.   Well-developed and well-nourished white woman. Head and exam shows no ocular or oral lesions. He has no palpable cervical or supraclavicular lymph nodes. Lungs are clear bilaterally. Cardiac exam regular in rhythm with no murmurs rubs or bruits. Abdomen is soft. He has good bowel sounds. There is no fluid wave. Has a well-healed laparotomy scar in the right upper quadrant. There is no palpable liver or spleen tip. Neck exam shows no tenderness over the spine ribs or hips. Extremities shows no clubbing, cyanosis or edema. He has good range of motion of his joints. Skin exam no rashes.   Lab Results  Component Value Date   WBC 7.9 09/23/2013   HGB 13.0 09/23/2013   HCT 36.5* 09/23/2013   MCV 90 09/23/2013   PLT 183 09/23/2013     Chemistry      Component Value Date/Time   NA 137 05/29/2013 1142   K 4.0 05/29/2013 1142   CL 101 05/29/2013 1142   CO2 22 05/29/2013 1142   BUN 28* 05/29/2013 1142   CREATININE 1.18 05/29/2013 1142   CREATININE 1.1 02/05/2013 1039      Component  Value Date/Time   CALCIUM 9.4 05/29/2013 1142   ALKPHOS 62 02/05/2013 1039   AST 39* 02/05/2013 1039   ALT 44 02/05/2013 1039   BILITOT 0.7 02/05/2013 1039         Impression and Plan: [name] is a 68 year old gentleman with hemochromatosis. He is doing quite well.  We will see what his ferritin is. We'll have to think that probably is close to 100. He is still a little bit anemic so it is possible that the ferritin is over 100.  I think we can probably get him back to be seen in 6 months now.   Volanda Napoleon, MD 8/25/201510:25 AM

## 2013-09-24 ENCOUNTER — Encounter: Payer: Self-pay | Admitting: Hematology & Oncology

## 2013-09-24 ENCOUNTER — Encounter: Payer: Self-pay | Admitting: *Deleted

## 2013-09-24 ENCOUNTER — Telehealth: Payer: Self-pay | Admitting: Hematology & Oncology

## 2013-09-24 NOTE — Telephone Encounter (Signed)
Left pt message to call and schedule appointment °

## 2013-09-30 ENCOUNTER — Other Ambulatory Visit: Payer: Self-pay | Admitting: Family

## 2013-09-30 ENCOUNTER — Ambulatory Visit (HOSPITAL_BASED_OUTPATIENT_CLINIC_OR_DEPARTMENT_OTHER): Payer: Medicare Other

## 2013-09-30 DIAGNOSIS — Z23 Encounter for immunization: Secondary | ICD-10-CM

## 2013-09-30 MED ORDER — INFLUENZA VAC SPLIT QUAD 0.5 ML IM SUSY
0.5000 mL | PREFILLED_SYRINGE | Freq: Once | INTRAMUSCULAR | Status: AC
  Administered 2013-09-30: 0.5 mL via INTRAMUSCULAR
  Filled 2013-09-30: qty 0.5

## 2013-09-30 NOTE — Patient Instructions (Signed)
Influenza Virus Vaccine injection (Fluarix) What is this medicine? INFLUENZA VIRUS VACCINE (in floo EN zuh VAHY ruhs vak SEEN) helps to reduce the risk of getting influenza also known as the flu. This medicine may be used for other purposes; ask your health care provider or pharmacist if you have questions. COMMON BRAND NAME(S): Fluarix, Fluzone What should I tell my health care provider before I take this medicine? They need to know if you have any of these conditions: -bleeding disorder like hemophilia -fever or infection -Guillain-Barre syndrome or other neurological problems -immune system problems -infection with the human immunodeficiency virus (HIV) or AIDS -low blood platelet counts -multiple sclerosis -an unusual or allergic reaction to influenza virus vaccine, eggs, chicken proteins, latex, gentamicin, other medicines, foods, dyes or preservatives -pregnant or trying to get pregnant -breast-feeding How should I use this medicine? This vaccine is for injection into a muscle. It is given by a health care professional. A copy of Vaccine Information Statements will be given before each vaccination. Read this sheet carefully each time. The sheet may change frequently. Talk to your pediatrician regarding the use of this medicine in children. Special care may be needed. Overdosage: If you think you have taken too much of this medicine contact a poison control center or emergency room at once. NOTE: This medicine is only for you. Do not share this medicine with others. What if I miss a dose? This does not apply. What may interact with this medicine? -chemotherapy or radiation therapy -medicines that lower your immune system like etanercept, anakinra, infliximab, and adalimumab -medicines that treat or prevent blood clots like warfarin -phenytoin -steroid medicines like prednisone or cortisone -theophylline -vaccines This list may not describe all possible interactions. Give your  health care provider a list of all the medicines, herbs, non-prescription drugs, or dietary supplements you use. Also tell them if you smoke, drink alcohol, or use illegal drugs. Some items may interact with your medicine. What should I watch for while using this medicine? Report any side effects that do not go away within 3 days to your doctor or health care professional. Call your health care provider if any unusual symptoms occur within 6 weeks of receiving this vaccine. You may still catch the flu, but the illness is not usually as bad. You cannot get the flu from the vaccine. The vaccine will not protect against colds or other illnesses that may cause fever. The vaccine is needed every year. What side effects may I notice from receiving this medicine? Side effects that you should report to your doctor or health care professional as soon as possible: -allergic reactions like skin rash, itching or hives, swelling of the face, lips, or tongue Side effects that usually do not require medical attention (report to your doctor or health care professional if they continue or are bothersome): -fever -headache -muscle aches and pains -pain, tenderness, redness, or swelling at site where injected -weak or tired This list may not describe all possible side effects. Call your doctor for medical advice about side effects. You may report side effects to FDA at 1-800-FDA-1088. Where should I keep my medicine? This vaccine is only given in a clinic, pharmacy, doctor's office, or other health care setting and will not be stored at home. NOTE: This sheet is a summary. It may not cover all possible information. If you have questions about this medicine, talk to your doctor, pharmacist, or health care provider.  2015, Elsevier/Gold Standard. (2007-08-14 09:30:40) Therapeutic Phlebotomy, Care After Refer   to this sheet in the next few weeks. These instructions provide you with information on caring for yourself  after your procedure. Your caregiver may also give you more specific instructions. Your treatment has been planned according to current medical practices, but problems sometimes occur. Call your caregiver if you have any problems or questions after your procedure. HOME CARE INSTRUCTIONS Most people can go back to their normal activities right away. Before you leave, be sure to ask if there is anything you should or should not do. In general, it would be wise to:  Keep the bandage dry. You can remove the bandage after about 5 hours.  Eat well-balanced meals for the next 24 hours.  Drink enough fluids to keep your urine clear or pale yellow.  Avoid drinking alcohol minimally until after eating.  Avoid smoking for at least 30 minutes after the procedure.  Avoid strenuous physical activity or heavy lifting or pulling for about 5 hours after the procedure.  Athletes should avoid strenuous exercise for 12 hours or more.  Change positions slowly for the remainder of the day to prevent light-headedness or fainting.  If you feel light-headed, lie down until the feeling subsides.  If you have bleeding from the needle insertion site, elevate your arm and press firmly on the site until the bleeding stops.  If bruising or bleeding appears under the skin, apply ice to the area for 15 to 20 minutes, 3 to 4 times per day. Put the ice in a plastic bag and place a towel between the bag of ice and your skin. Do this while you are awake for the first 24 hours. The ice packs can be stopped before 24 hours if the swelling goes away. If swelling persists after 24 hours, a warm, moist washcloth can be applied to the area for 15 to 20 minutes, 3 to 4 times per day. The warm, moist treatments can be stopped when the swelling goes away.  It is important to continue further therapeutic phlebotomy as directed by your caregiver. SEEK MEDICAL CARE IF:  There is bleeding or fluid leaking from the needle insertion  site.  The needle insertion site becomes swollen, red, or sore.  You feel light-headed, dizzy or nauseated, and the feeling does not go away.  You notice new bruising at the needle insertion site.  You feel more weak or tired than normal.  You develop a fever. SEEK IMMEDIATE MEDICAL CARE IF:   There is increased bleeding, pain, or swelling from the needle insertion site.  You have severe nausea or vomiting.  You have chest pain.  You have trouble breathing. MAKE SURE YOU:  Understand these instructions.  Will watch your condition.  Will get help right away if you are not doing well or get worse. Document Released: 06/20/2010 Document Revised: 06/02/2013 Document Reviewed: 06/20/2010 ExitCare Patient Information 2015 [employer]. This information is not intended to replace advice given to you by your health care provider. Make sure you discuss any questions you have with your health care provider.  

## 2013-10-03 ENCOUNTER — Ambulatory Visit (INDEPENDENT_AMBULATORY_CARE_PROVIDER_SITE_OTHER): Payer: Medicare Other

## 2013-10-03 DIAGNOSIS — Z954 Presence of other heart-valve replacement: Secondary | ICD-10-CM

## 2013-10-03 DIAGNOSIS — I359 Nonrheumatic aortic valve disorder, unspecified: Secondary | ICD-10-CM | POA: Diagnosis not present

## 2013-10-03 DIAGNOSIS — Z5181 Encounter for therapeutic drug level monitoring: Secondary | ICD-10-CM

## 2013-10-03 LAB — POCT INR: INR: 2.2

## 2013-10-04 ENCOUNTER — Other Ambulatory Visit: Payer: Self-pay | Admitting: Family

## 2013-10-26 ENCOUNTER — Encounter: Payer: Self-pay | Admitting: Family

## 2013-11-12 ENCOUNTER — Ambulatory Visit (INDEPENDENT_AMBULATORY_CARE_PROVIDER_SITE_OTHER): Payer: Medicare Other | Admitting: Pharmacist

## 2013-11-12 DIAGNOSIS — I359 Nonrheumatic aortic valve disorder, unspecified: Secondary | ICD-10-CM | POA: Diagnosis not present

## 2013-11-12 DIAGNOSIS — Z5181 Encounter for therapeutic drug level monitoring: Secondary | ICD-10-CM

## 2013-11-12 DIAGNOSIS — Z952 Presence of prosthetic heart valve: Secondary | ICD-10-CM

## 2013-11-12 DIAGNOSIS — Z954 Presence of other heart-valve replacement: Secondary | ICD-10-CM

## 2013-11-12 LAB — POCT INR: INR: 3.1

## 2013-11-28 ENCOUNTER — Other Ambulatory Visit: Payer: Self-pay | Admitting: Family

## 2013-11-28 ENCOUNTER — Encounter: Payer: Self-pay | Admitting: Family

## 2013-11-28 ENCOUNTER — Ambulatory Visit (INDEPENDENT_AMBULATORY_CARE_PROVIDER_SITE_OTHER): Payer: Medicare Other | Admitting: Family

## 2013-11-28 VITALS — BP 135/82 | HR 67 | Temp 98.2°F | Ht 70.0 in | Wt 225.0 lb

## 2013-11-28 DIAGNOSIS — I25810 Atherosclerosis of coronary artery bypass graft(s) without angina pectoris: Secondary | ICD-10-CM | POA: Diagnosis not present

## 2013-11-28 DIAGNOSIS — I2510 Atherosclerotic heart disease of native coronary artery without angina pectoris: Secondary | ICD-10-CM

## 2013-11-28 DIAGNOSIS — Z0000 Encounter for general adult medical examination without abnormal findings: Secondary | ICD-10-CM | POA: Diagnosis not present

## 2013-11-28 DIAGNOSIS — I10 Essential (primary) hypertension: Secondary | ICD-10-CM | POA: Diagnosis not present

## 2013-11-28 DIAGNOSIS — E7800 Pure hypercholesterolemia, unspecified: Secondary | ICD-10-CM

## 2013-11-28 DIAGNOSIS — Z8546 Personal history of malignant neoplasm of prostate: Secondary | ICD-10-CM | POA: Diagnosis not present

## 2013-11-28 DIAGNOSIS — E119 Type 2 diabetes mellitus without complications: Secondary | ICD-10-CM | POA: Diagnosis not present

## 2013-11-28 LAB — HEMOGLOBIN A1C: HEMOGLOBIN A1C: 6.3 % (ref 4.6–6.5)

## 2013-11-28 MED ORDER — METFORMIN HCL ER 500 MG PO TB24: ORAL_TABLET | ORAL | Status: DC

## 2013-11-28 NOTE — Assessment & Plan Note (Signed)
Clinically stable, management per cardiology.  

## 2013-11-28 NOTE — Progress Notes (Signed)
Pre visit review using our clinic review tool, if applicable. No additional management support is needed unless otherwise documented below in the visit note. 

## 2013-11-28 NOTE — Assessment & Plan Note (Signed)
Continue statin and as tolerated fish oil.

## 2013-11-28 NOTE — Progress Notes (Signed)
Patient ID: [name], male   DOB: 12-28-1945, 68 y.o.   MRN: [id number] Subjective:   Patient here for Medicare annual wellness visit and management of other chronic and acute problems. 1. Hypertension- Currently on Benicar HCT. Todays BP is 135/82.  Takes medication with high compliance.  2. CAD -  Currently on metoprolol xl.  3. Diabetes - Currently maintained with diet, Metformin and Januvia. Takes medication with high compliance.  Complains of diarrhea at least 5 days a week.   4. Prostate cancer- Follows with [facility].  Next appointment is in December.  5. Hypertriglyceridemia - Currently takes atorvastatin 20mg . Takes at night with high compliance. Reports not always compliant with fish oil as he thinks it might contribute to his diarrhea.    6. Aortic valve - Currently maintained on coumadin.    Risk factors: CAD, OSA, DM, hypertriglyceridemia   Roster of Physicians Providing Medical Care to Patient: Dr. Marin Olp - hemochromatosis Dr. Luberta Robertson - Prostate CA Dr. Stanford Breed - Cardiology   Activities of Daily Living  In your present state of health, do you have any difficulty performing the following activities? Preparing food and eating?: No  Bathing yourself: No  Getting dressed: No  Using the toilet:No  Moving around from place to place: No  In the past year have you fallen or had a near fall?:No     Home Safety: Has smoke detector and wears seat belts. No firearms. No excess sun exposure.  Diet and Exercise  Current exercise habits: Plays golf once a week, goes to gym 2-3 times per week.   Dietary issues discussed: healthy diet. Eats a lot of veggies and fruits.    Depression Screen  (Note: if answer to either of the following is "Yes", then a more complete depression screening is indicated)  Q1: Over the past two weeks, have you felt down, depressed or hopeless?no  Q2: Over the past two weeks, have you felt little interest or pleasure in doing things? no    The following portions of the patient's history were reviewed and updated as appropriate: allergies, current medications, past family history, past medical history, past social history, past surgical history and problem list.   Past Medical History  Diagnosis Date  . Allergy   . Hyperlipidemia   . Hypertension   . Cancer 2011    prostate  . Obesity     moderate  . Aortic stenosis   . CAD (coronary artery disease)     cabg  . History of gout   . Heart murmur   . History of hepatitis 1974  . Type II or unspecified type diabetes mellitus without mention of complication, not stated as uncontrolled   . Hemorrhoids   . Sensorineural hearing loss   . Hypertrophic obstructive cardiomyopathy   . Erectile dysfunction   . Hemochromatosis 02/14/2011    History   Social History  . Marital Status: Married    Spouse Name: N/A    Number of Children: N/A  . Years of Education: N/A   Occupational History  . Not on file.   Social History Main Topics  . Smoking status: Former Smoker    Types: Cigars  . Smokeless tobacco: Not on file  . Alcohol Use: Yes     Comment: 4 glasses wine a month  . Drug Use: Not on file  . Sexual Activity: Not on file   Other Topics Concern  . Not on file   Social History Narrative  . No  narrative on file    Past Surgical History  Procedure Laterality Date  . Prostate seed implant  3/12  . Coronary artery bypass graft  10/2003  . Aortic valve replacement      [facility]  . Hip surgery  2006    right hip  . Cholecystectomy  1990  . Hernia repair  1999    right, inguinal  . Hernia repair  2002    left, inguinal  . Pilonidal cyst excision  1964  . Tonsillectomy  childhood  . Appendectomy  1990    Family History  Problem Relation Age of Onset  . Heart disease Mother   . Stroke Mother   . Heart disease Father   . Coronary artery disease Brother   . Asperger's syndrome Son   . Hyperlipidemia Son   . Cancer Neg Hx     negative for  colon cancer    No Known Allergies  Current Outpatient Prescriptions on File Prior to Visit  Medication Sig Dispense Refill  . atorvastatin (LIPITOR) 20 MG tablet TAKE ONE (1) TABLET BY MOUTH EVERY DAY  30 tablet  2  . BENICAR HCT 20-12.5 MG per tablet TAKE 1/2 TABLET BY MOUTH DAILY  15 tablet  3  . GLIPIZIDE XL 5 MG 24 hr tablet TAKE ONE (1) TABLET BY MOUTH EVERY DAY WITH BREAKFAST  30 tablet  2  . glucose blood (FREESTYLE LITE) test strip USE AS DIRECTED TO TEST BLOOD SUGAR ONCE DAILY. Dx: 250.00  50 each  5  . JANUVIA 100 MG tablet TAKE ONE (1) TABLET EACH DAY  30 tablet  3  . Lancets (FREESTYLE) lancets Use to check blood sugar once a day as directed.  100 each  1  . metoprolol succinate (TOPROL-XL) 25 MG 24 hr tablet TAKE 1/2 TABLET DAILY  15 tablet  3  . Omega-3 Fatty Acids (FISH OIL) 1000 MG CAPS Take 2 capsules by mouth 2 (two) times daily.        Marland Kitchen warfarin (COUMADIN) 7.5 MG tablet Take as directed by coumadin clinic  35 tablet  3   No current facility-administered medications on file prior to visit.    BP 135/82  Pulse 67  Temp(Src) 98.2 F (36.8 C)  Ht 5\' 10"  (1.778 m)  Wt 225 lb (102.059 kg)  BMI 32.28 kg/m2  SpO2 98%     Objective:   Vision: see nursing Hearing: unable to hear forced whisper at 6 feet Body mass index: 32.28 Cognitive Impairment Assessment: cognition, memory and judgment appear normal.   Physical Exam  Constitutional: He is oriented to person, place, and time. He appears well-developed and well-nourished. No distress.  HENT:  Head: Normocephalic and atraumatic.  Right Ear: Tympanic membrane and ear canal normal.  Left Ear: Tympanic membrane and ear canal normal.  Mouth/Throat: Oropharynx is clear and moist.  Eyes: Pupils are equal, round, and reactive to light. No scleral icterus.  Neck: Normal range of motion. No thyromegaly present.  Cardiovascular: Normal rate and regular rhythm.   No murmur heard. Pulmonary/Chest: Effort normal and  breath sounds normal. No respiratory distress. He has no wheezes. He has no rales. He exhibits no tenderness.  Abdominal: Soft. Bowel sounds are normal. He exhibits no distension and no mass. There is no tenderness. There is no rebound and no guarding.  Musculoskeletal: He exhibits no edema.  Lymphadenopathy:    He has no cervical adenopathy.  Neurological: He is alert and oriented to person, place, and  time. He has normal reflexes. He exhibits normal muscle tone. Coordination normal.  Skin: Skin is warm and dry.  Psychiatric: He has a normal mood and affect. His behavior is normal. Judgment and thought content normal.          Assessment & Plan:     Assessment:   Medicare wellness utd on preventive parameters     Plan:    During the course of the visit the patient was educated and counseled about appropriate screening and preventive services including:          Colonoscopy: 2011. Will follow up in 10 years.  Bone densitometry screening: never  Diabetes screening: Will do A1C today Nutrition counseling: Talked about avoids carbohydrates. Continue with fruits and veggies.  Last eye exam : Feb 2015 Last dental exam : October 2015 Diabetic Foot Exam: completed today.  Vaccines / LABS Up to date on all vaccines.  Will do BMET and A1c today. Up to date on lipid panel and urine microalbumin.    Patient Instructions (the written plan) was given to the patient.    I have personally seen and examined patient and agree with Jettie Booze NP student's assessment and plan- [name]

## 2013-11-28 NOTE — Assessment & Plan Note (Signed)
Lab Results  Component Value Date   HGBA1C 6.9* 05/22/2013   Has been stable. Does attribute diarrhea to metformin. Will change to metformin xr to see if he is better able to tolerate.  Obtain follow up A1C.

## 2013-11-28 NOTE — Assessment & Plan Note (Signed)
BP stable on current meds. Continue same.  

## 2013-11-28 NOTE — Assessment & Plan Note (Signed)
Surveillance/management per urology.

## 2013-11-28 NOTE — Patient Instructions (Signed)
Please complete lab work prior to leaving. Stop metformin, instead start metformin xr to see if this helps with your diarrhea.  Let me know if this med is too costly. Schedule follow up with Dr. Stanford Breed. Follow up in 3 months.

## 2013-11-29 ENCOUNTER — Encounter: Payer: Self-pay | Admitting: Family

## 2013-12-05 ENCOUNTER — Telehealth: Payer: Self-pay | Admitting: Family

## 2013-12-05 MED ORDER — COLCHICINE 0.6 MG PO TABS: ORAL_TABLET | ORAL | Status: DC

## 2013-12-05 NOTE — Telephone Encounter (Signed)
Rx sent, needs to be seen in clinic if symptoms worsen or if symptoms do not improve.

## 2013-12-05 NOTE — Telephone Encounter (Signed)
Past medication history shows colchicine. Please advise.

## 2013-12-05 NOTE — Telephone Encounter (Signed)
°  Caller name: Omid, Deardorff Relation to pt: self  Call back number: [phone] Pharmacy: [facility] [phone]  Reason for call:   Pt states he has reocurring gout left heel and right knee requesting rx

## 2013-12-09 ENCOUNTER — Other Ambulatory Visit: Payer: Self-pay | Admitting: Cardiology

## 2013-12-11 ENCOUNTER — Telehealth: Payer: Self-pay | Admitting: *Deleted

## 2013-12-11 ENCOUNTER — Other Ambulatory Visit: Payer: Self-pay | Admitting: Family

## 2013-12-11 MED ORDER — ATORVASTATIN CALCIUM 20 MG PO TABS: ORAL_TABLET | ORAL | Status: DC

## 2013-12-11 MED ORDER — COLCHICINE 0.6 MG PO TABS
ORAL_TABLET | ORAL | Status: DC
Start: 2013-12-11 — End: 2014-08-25

## 2013-12-11 NOTE — Telephone Encounter (Signed)
REceived fax from [facility] for atorvastatin 20mg  daily. Pt has f/u 02/18/14. Refill sent.

## 2013-12-12 ENCOUNTER — Other Ambulatory Visit: Payer: Self-pay | Admitting: Family

## 2013-12-24 ENCOUNTER — Ambulatory Visit (INDEPENDENT_AMBULATORY_CARE_PROVIDER_SITE_OTHER): Payer: Medicare Other | Admitting: Pharmacist

## 2013-12-24 DIAGNOSIS — I359 Nonrheumatic aortic valve disorder, unspecified: Secondary | ICD-10-CM | POA: Diagnosis not present

## 2013-12-24 DIAGNOSIS — Z954 Presence of other heart-valve replacement: Secondary | ICD-10-CM

## 2013-12-24 DIAGNOSIS — Z952 Presence of prosthetic heart valve: Secondary | ICD-10-CM

## 2013-12-24 DIAGNOSIS — Z5181 Encounter for therapeutic drug level monitoring: Secondary | ICD-10-CM | POA: Diagnosis not present

## 2013-12-24 LAB — POCT INR: INR: 2.7

## 2013-12-31 DIAGNOSIS — C61 Malignant neoplasm of prostate: Secondary | ICD-10-CM | POA: Diagnosis not present

## 2014-01-07 DIAGNOSIS — C61 Malignant neoplasm of prostate: Secondary | ICD-10-CM | POA: Diagnosis not present

## 2014-01-21 ENCOUNTER — Other Ambulatory Visit: Payer: Self-pay | Admitting: Family

## 2014-02-03 ENCOUNTER — Ambulatory Visit (INDEPENDENT_AMBULATORY_CARE_PROVIDER_SITE_OTHER): Payer: Medicare Other | Admitting: *Deleted

## 2014-02-03 DIAGNOSIS — Z5181 Encounter for therapeutic drug level monitoring: Secondary | ICD-10-CM | POA: Diagnosis not present

## 2014-02-03 DIAGNOSIS — I359 Nonrheumatic aortic valve disorder, unspecified: Secondary | ICD-10-CM

## 2014-02-03 DIAGNOSIS — Z952 Presence of prosthetic heart valve: Secondary | ICD-10-CM

## 2014-02-03 DIAGNOSIS — Z954 Presence of other heart-valve replacement: Secondary | ICD-10-CM

## 2014-02-03 LAB — POCT INR: INR: 3

## 2014-02-09 ENCOUNTER — Other Ambulatory Visit: Payer: Self-pay | Admitting: Family

## 2014-02-25 ENCOUNTER — Encounter: Payer: Self-pay | Admitting: Family

## 2014-02-25 DIAGNOSIS — E785 Hyperlipidemia, unspecified: Secondary | ICD-10-CM

## 2014-02-25 DIAGNOSIS — E119 Type 2 diabetes mellitus without complications: Secondary | ICD-10-CM

## 2014-02-25 NOTE — Telephone Encounter (Signed)
Please contact pt and arrange lab visit prior to upcoming appointment.

## 2014-02-27 ENCOUNTER — Telehealth: Payer: Self-pay | Admitting: Family

## 2014-02-27 NOTE — Telephone Encounter (Signed)
Future orders were placed on 02/25/14. Attempted to reach pt and left detailed message on cell voicemail that lab orders are in the system and to call us if he wants to complete labs today or wait and complete them at f/u on 03/02/14.

## 2014-02-27 NOTE — Telephone Encounter (Signed)
Caller name: Taisei, Bonnette Relation to pt: self  Call back number: Pharmacy:  Reason for call:  Pt states he would like labs done prior to Monday Mar 02, 2014 appointment. Please leave voicemail advising

## 2014-03-02 ENCOUNTER — Ambulatory Visit (INDEPENDENT_AMBULATORY_CARE_PROVIDER_SITE_OTHER): Payer: Medicare Other | Admitting: Family

## 2014-03-02 ENCOUNTER — Encounter: Payer: Self-pay | Admitting: Family

## 2014-03-02 VITALS — BP 128/84 | HR 63 | Temp 97.9°F | Resp 16 | Ht 70.0 in | Wt 237.4 lb

## 2014-03-02 DIAGNOSIS — E119 Type 2 diabetes mellitus without complications: Secondary | ICD-10-CM | POA: Diagnosis not present

## 2014-03-02 DIAGNOSIS — I10 Essential (primary) hypertension: Secondary | ICD-10-CM

## 2014-03-02 DIAGNOSIS — K439 Ventral hernia without obstruction or gangrene: Secondary | ICD-10-CM

## 2014-03-02 DIAGNOSIS — E7800 Pure hypercholesterolemia, unspecified: Secondary | ICD-10-CM

## 2014-03-02 LAB — LIPID PANEL
Cholesterol: 133 mg/dL (ref 0–200)
HDL: 31.7 mg/dL — ABNORMAL LOW (ref >39.00)
NonHDL: 101.3
Total CHOL/HDL Ratio: 4
Triglycerides: 346 mg/dL — ABNORMAL HIGH (ref 0.0–149.0)
VLDL: 69.2 mg/dL — ABNORMAL HIGH (ref 0.0–40.0)

## 2014-03-02 LAB — BASIC METABOLIC PANEL
BUN: 27 mg/dL — AB (ref 6–23)
CO2: 29 meq/L (ref 19–32)
Calcium: 9.3 mg/dL (ref 8.4–10.5)
Chloride: 101 mEq/L (ref 96–112)
Creatinine, Ser: 1.2 mg/dL (ref 0.40–1.50)
GFR: 63.81 mL/min (ref >60.00)
GLUCOSE: 123 mg/dL — AB (ref 70–99)
POTASSIUM: 4.6 meq/L (ref 3.5–5.1)
Sodium: 136 mEq/L (ref 135–145)

## 2014-03-02 LAB — LDL CHOLESTEROL, DIRECT: Direct LDL: 63 mg/dL

## 2014-03-02 LAB — HEPATIC FUNCTION PANEL
ALT: 34 U/L (ref 0–53)
AST: 34 U/L (ref 0–37)
Albumin: 4.3 g/dL (ref 3.5–5.2)
Alkaline Phosphatase: 66 U/L (ref 39–117)
Bilirubin, Direct: 0.1 mg/dL (ref 0.0–0.3)
Total Bilirubin: 0.6 mg/dL (ref 0.2–1.2)
Total Protein: 7.7 g/dL (ref 6.0–8.3)

## 2014-03-02 LAB — HEMOGLOBIN A1C: Hgb A1c MFr Bld: 7.7 % — ABNORMAL HIGH (ref 4.6–6.5)

## 2014-03-02 NOTE — Assessment & Plan Note (Signed)
Obtain follow up lipid panel.

## 2014-03-02 NOTE — Addendum Note (Signed)
Addended by: Kelle Darting A on: 03/02/2014 10:49 AM   Modules accepted: Orders

## 2014-03-02 NOTE — Assessment & Plan Note (Signed)
Stable on current meds. Reinforced diabetic diet, obtain a1c.

## 2014-03-02 NOTE — Patient Instructions (Signed)
Please complete lab work prior to leaving.   Please schedule a follow up appointment in 3 months.  

## 2014-03-02 NOTE — Progress Notes (Addendum)
Subjective:    Patient ID: [name], male    DOB: [birth year], 69 y.o.   MRN: [id number]  HPI  Patient here for follow up.  1 Pt is currently maintained on the following medications for diabetes:glipizide, metformin and januvia.  Last A1C was:   Lab Results  Component Value Date   HGBA1C 6.3 11/28/2013  Last diabetic eye exam was: 04/01/13 Denies polyuria/polydipsia. Denies hypoglycemia Home glucose readings range: sugars around 140-143  2.  Patient is currently maintained on the following medications for blood pressure: metformin and benicar hct Patient reports good compliance with blood pressure medications. Patient denies chest pain, shortness of breath, + foot swelling Last 3 blood pressure readings in our office are as follows: BP Readings from Last 3 Encounters:  03/02/14 128/84  11/28/13 135/82  09/30/13 125/64   3.  AVR- continues coumadin- followed at coumadin clinic.      Past Medical History  Diagnosis Date  . Allergy   . Hyperlipidemia   . Hypertension   . Cancer 2011    prostate  . Obesity     moderate  . Aortic stenosis   . CAD (coronary artery disease)     cabg  . History of gout   . Heart murmur   . History of hepatitis 1974  . Type II or unspecified type diabetes mellitus without mention of complication, not stated as uncontrolled   . Hemorrhoids   . Sensorineural hearing loss   . Hypertrophic obstructive cardiomyopathy   . Erectile dysfunction   . Hemochromatosis 02/14/2011    Review of Systems     Objective:    Physical Exam  Constitutional: He is oriented to person, place, and time. He appears well-developed and well-nourished. No distress.  HENT:  Head: Normocephalic and atraumatic.  Cardiovascular: Normal rate and regular rhythm.   No murmur heard. Click from Mitral valve  Pulmonary/Chest: Effort normal and breath sounds normal. No respiratory distress. He has no wheezes. He has no rales.  Abdominal: Soft. Bowel sounds  are normal. He exhibits no distension.  + ventral hernia noted when laying flat and trying to sit upright, full reducible  Musculoskeletal: He exhibits edema.  2+  Bilateral pedal edema  Neurological: He is alert and oriented to person, place, and time.  Skin: Skin is warm and dry.  Psychiatric: He has a normal mood and affect. His behavior is normal. Thought content normal.    BP 128/84 mmHg  Pulse 63  Temp(Src) 97.9 F (36.6 C) (Oral)  Resp 16  Ht 5\' 10"  (1.778 m)  Wt 237 lb 6.4 oz (107.684 kg)  BMI 34.06 kg/m2  SpO2 97% Wt Readings from Last 3 Encounters:  03/02/14 237 lb 6.4 oz (107.684 kg)  11/28/13 225 lb (102.059 kg)  09/23/13 229 lb (103.874 kg)     Lab Results  Component Value Date   WBC 7.9 09/23/2013   HGB 13.0 09/23/2013   HCT 36.5* 09/23/2013   PLT 183 09/23/2013   GLUCOSE 150* 05/29/2013   CHOL 123 02/05/2013   TRIG 217.0* 02/05/2013   HDL 28.20* 02/05/2013   LDLDIRECT 57.8 02/05/2013   LDLCALC 54 11/25/2010   ALT 44 02/05/2013   AST 39* 02/05/2013   NA 137 05/29/2013   K 4.0 05/29/2013   CL 101 05/29/2013   CREATININE 1.18 05/29/2013   BUN 28* 05/29/2013   CO2 22 05/29/2013   TSH 2.251 06/16/2009   PSA 13.88* 06/16/2009   INR 3.0 02/03/2014   HGBA1C  6.3 11/28/2013   MICROALBUR 3.79* 02/12/2013       Assessment & Plan:   Problem List Items Addressed This Visit    None       O'SULLIVAN,Nysir Fergusson S., NP

## 2014-03-02 NOTE — Addendum Note (Signed)
Addended by: Peggyann Shoals on: 03/02/2014 11:56 AM   Modules accepted: Orders

## 2014-03-02 NOTE — Assessment & Plan Note (Signed)
BP stable on current meds, continue same.  

## 2014-03-03 DIAGNOSIS — K439 Ventral hernia without obstruction or gangrene: Secondary | ICD-10-CM

## 2014-03-03 HISTORY — DX: Ventral hernia without obstruction or gangrene: K43.9

## 2014-03-03 MED ORDER — INSULIN GLARGINE 100 UNIT/ML SOLOSTAR PEN
10.0000 [IU] | PEN_INJECTOR | Freq: Every day | SUBCUTANEOUS | Status: DC
Start: 2014-03-03 — End: 2014-03-26

## 2014-03-03 MED ORDER — PEN NEEDLES 31G X 6 MM MISC
Status: DC
Start: 2014-03-03 — End: 2015-03-25

## 2014-03-03 NOTE — Assessment & Plan Note (Signed)
Will refer for surgical consult.

## 2014-03-03 NOTE — Telephone Encounter (Signed)
I reviewed his lab work and his sugar is above goal.  At this point, I would recommend adding a once daily insulin. Pended below. Please schedule a nurse visit for teaching. Recommend that he start lantus 10 units once daily.  D/c glipizide.  Check fasting sugar every day for 1 week and send me results through mychart.

## 2014-03-03 NOTE — Telephone Encounter (Signed)
Left message on home # to return my call. 

## 2014-03-03 NOTE — Addendum Note (Signed)
Addended by: [name] on: 03/03/2014 03:57 PM   Modules accepted: Orders

## 2014-03-03 NOTE — Telephone Encounter (Signed)
Notified pt and he voices understanding. Scheduled nurse visit for tomorrow at 10:45am and pt is aware to bring insulin to appt. Pt wants to know if he will also be able to stop Januvia?

## 2014-03-04 ENCOUNTER — Ambulatory Visit: Payer: Medicare Other

## 2014-03-04 DIAGNOSIS — E119 Type 2 diabetes mellitus without complications: Secondary | ICD-10-CM

## 2014-03-04 NOTE — Progress Notes (Signed)
Patient in for patient teaching regarding self injection of Lantus Insulin using Injection pen. Given demonstration then asked patient to give return demonstration. Patient demonstrated successfully.

## 2014-03-05 ENCOUNTER — Other Ambulatory Visit: Payer: Self-pay | Admitting: Cardiology

## 2014-03-06 ENCOUNTER — Ambulatory Visit (INDEPENDENT_AMBULATORY_CARE_PROVIDER_SITE_OTHER): Payer: Medicare Other | Admitting: Pharmacist

## 2014-03-06 DIAGNOSIS — Z954 Presence of other heart-valve replacement: Secondary | ICD-10-CM | POA: Diagnosis not present

## 2014-03-06 DIAGNOSIS — Z952 Presence of prosthetic heart valve: Secondary | ICD-10-CM

## 2014-03-06 DIAGNOSIS — Z5181 Encounter for therapeutic drug level monitoring: Secondary | ICD-10-CM | POA: Diagnosis not present

## 2014-03-06 DIAGNOSIS — I359 Nonrheumatic aortic valve disorder, unspecified: Secondary | ICD-10-CM

## 2014-03-06 LAB — POCT INR: INR: 2.9

## 2014-03-11 ENCOUNTER — Encounter: Payer: Self-pay | Admitting: Family

## 2014-03-12 DIAGNOSIS — M6208 Separation of muscle (nontraumatic), other site: Secondary | ICD-10-CM | POA: Diagnosis not present

## 2014-03-17 ENCOUNTER — Other Ambulatory Visit: Payer: Self-pay | Admitting: Family

## 2014-03-24 DIAGNOSIS — E119 Type 2 diabetes mellitus without complications: Secondary | ICD-10-CM | POA: Diagnosis not present

## 2014-03-24 DIAGNOSIS — H524 Presbyopia: Secondary | ICD-10-CM | POA: Diagnosis not present

## 2014-03-26 ENCOUNTER — Other Ambulatory Visit (HOSPITAL_BASED_OUTPATIENT_CLINIC_OR_DEPARTMENT_OTHER): Payer: Medicare Other | Admitting: Lab

## 2014-03-26 ENCOUNTER — Encounter: Payer: Self-pay | Admitting: Hematology & Oncology

## 2014-03-26 ENCOUNTER — Encounter: Payer: Self-pay | Admitting: Family

## 2014-03-26 ENCOUNTER — Ambulatory Visit (HOSPITAL_BASED_OUTPATIENT_CLINIC_OR_DEPARTMENT_OTHER): Payer: Medicare Other | Admitting: Hematology & Oncology

## 2014-03-26 ENCOUNTER — Other Ambulatory Visit: Payer: Self-pay | Admitting: Family

## 2014-03-26 LAB — CBC WITH DIFFERENTIAL (CANCER CENTER ONLY)
BASO#: 0 10*3/uL (ref 0.0–0.2)
BASO%: 0.4 % (ref 0.0–2.0)
EOS%: 4.3 % (ref 0.0–7.0)
Eosinophils Absolute: 0.3 10*3/uL (ref 0.0–0.5)
HEMATOCRIT: 37.7 % — AB (ref 38.7–49.9)
HGB: 13 g/dL (ref 13.0–17.1)
LYMPH#: 1.3 10*3/uL (ref 0.9–3.3)
LYMPH%: 16.8 % (ref 14.0–48.0)
MCH: 30.7 pg (ref 28.0–33.4)
MCHC: 34.5 g/dL (ref 32.0–35.9)
MCV: 89 fL (ref 82–98)
MONO#: 0.5 10*3/uL (ref 0.1–0.9)
MONO%: 6.8 % (ref 0.0–13.0)
NEUT#: 5.7 10*3/uL (ref 1.5–6.5)
NEUT%: 71.7 % (ref 40.0–80.0)
PLATELETS: 185 10*3/uL (ref 145–400)
RBC: 4.23 10*6/uL (ref 4.20–5.70)
RDW: 13.2 % (ref 11.1–15.7)
WBC: 7.9 10*3/uL (ref 4.0–10.0)

## 2014-03-26 LAB — COMPREHENSIVE METABOLIC PANEL (CC13)
ALT: 35 U/L (ref 0–55)
ANION GAP: 10 meq/L (ref 3–11)
AST: 41 U/L — ABNORMAL HIGH (ref 5–34)
Albumin: 3.9 g/dL (ref 3.5–5.0)
Alkaline Phosphatase: 64 U/L (ref 40–150)
BILIRUBIN TOTAL: 0.6 mg/dL (ref 0.20–1.20)
BUN: 25 mg/dL (ref 7.0–26.0)
CO2: 23 meq/L (ref 22–29)
CREATININE: 1.2 mg/dL (ref 0.7–1.3)
Calcium: 9.1 mg/dL (ref 8.4–10.4)
Chloride: 103 mEq/L (ref 98–109)
EGFR: 62 mL/min/{1.73_m2} — AB (ref >90)
GLUCOSE: 238 mg/dL — AB (ref 70–140)
Potassium: 4.4 mEq/L (ref 3.5–5.1)
Sodium: 136 mEq/L (ref 136–145)
Total Protein: 7.3 g/dL (ref 6.4–8.3)

## 2014-03-26 LAB — IRON AND TIBC CHCC
%SAT: 26 % (ref 20–55)
IRON: 103 ug/dL (ref 42–163)
TIBC: 393 ug/dL (ref 202–409)
UIBC: 289 ug/dL (ref 117–376)

## 2014-03-26 LAB — FERRITIN CHCC: FERRITIN: 63 ng/mL (ref 22–316)

## 2014-03-26 MED ORDER — INSULIN GLARGINE 100 UNIT/ML SOLOSTAR PEN: 13.0000 [IU] | PEN_INJECTOR | Freq: Every day | SUBCUTANEOUS | Status: DC

## 2014-03-26 NOTE — Progress Notes (Signed)
Hematology and Oncology Follow Up Visit  [name] [id number] 02-Apr-1945 69 y.o. 03/26/2014   Principle Diagnosis:  1. Hemochromatosis (H63D homozygous mutation). 2. Stage I prostate cancer, status post radiation therapy and     radiation seeds.  Current Therapy:    Phlebotomy to maintain a ferritin less than 100      Interim History:  [name] is back for followup. We last saw him back in August. At that point time, his ferritin was 102. He did go ahead and get phlebotomized.  He just had his 69th birthday. He enjoyed this.  He has a sub-conjunctival hematoma in the right eye. It is not affecting his vision however.  His blood sugars will be his biggest issue. He is being followed by Dr. Inda Castle for this.  He's had no rashes. He's had no leg swelling. He's had no change in bowel or bladder habits.    Medications:  Current outpatient prescriptions:  .  atorvastatin (LIPITOR) 20 MG tablet, TAKE ONE (1) TABLET BY MOUTH EVERY DAY, Disp: 30 tablet, Rfl: 3 .  BENICAR HCT 20-12.5 MG per tablet, TAKE 1/2 TABLET BY MOUTH DAILY, Disp: 15 tablet, Rfl: 2 .  colchicine 0.6 MG tablet, 2 tabs by mouth at start or gout flare, then 1 tab 1 hour later.  May repeat following day if no improvement., Disp: 9 tablet, Rfl: 1 .  FREESTYLE LITE test strip, CHECK BLOOD SUGAR ONCE DAILY, Disp: 50 each, Rfl: 3 .  Insulin Glargine (LANTUS SOLOSTAR) 100 UNIT/ML Solostar Pen, Inject 10 Units into the skin daily at 10 pm., Disp: 5 pen, Rfl: 5 .  Insulin Pen Needle (PEN NEEDLES) 31G X 6 MM MISC, Use as directed, Disp: 100 each, Rfl: 3 .  JANUVIA 100 MG tablet, TAKE ONE (1) TABLET EACH DAY, Disp: 30 tablet, Rfl: 3 .  Lancets (FREESTYLE) lancets, Use to check blood sugar once a day as directed., Disp: 100 each, Rfl: 1 .  metFORMIN (GLUCOPHAGE-XR) 500 MG 24 hr tablet, TAKE 4 TABLETS BY MOUTH ONCE DAILY WITH BREAKFAST, Disp: 120 tablet, Rfl: 3 .  metoprolol succinate (TOPROL-XL) 25 MG 24 hr tablet,  TAKE 1/2 TABLET DAILY, Disp: 15 tablet, Rfl: 3 .  Omega-3 Fatty Acids (FISH OIL) 1000 MG CAPS, Take 2 capsules by mouth 2 (two) times daily.  , Disp: , Rfl:  .  warfarin (COUMADIN) 7.5 MG tablet, TAKE 1 AND 1/2 TABLETS BY MOUTH DAILY ORAS DIRECTED BY COUMADIN CLINIC, Disp: 45 tablet, Rfl: 5  Allergies: No Known Allergies  Past Medical History, Surgical history, Social history, and Family History were reviewed and updated.  Review of Systems: As above  Physical Exam:  height is 5\' 7"  (1.702 m) and weight is 237 lb (107.502 kg). His oral temperature is 98.2 F (36.8 C). His blood pressure is 141/53 and his pulse is 63. His respiration is 18.   Well-developed and well-nourished white woman. Head and exam shows no ocular or oral lesions. He has no palpable cervical or supraclavicular lymph nodes. Lungs are clear bilaterally. Cardiac exam regular rate and rhythm with a systolic click consistent with his mechanical valve. . Abdomen is soft. He has good bowel sounds. There is no fluid wave. He has a well-healed laparotomy scar in the right upper quadrant. There is no palpable liver or spleen tip. Neck exam shows no tenderness over the spine ribs or hips. Extremities shows no clubbing, cyanosis or edema. He has good range of motion of his joints. Skin exam  no rashes.   Lab Results  Component Value Date   WBC 7.9 03/26/2014   HGB 13.0 03/26/2014   HCT 37.7* 03/26/2014   MCV 89 03/26/2014   PLT 185 03/26/2014     Chemistry      Component Value Date/Time   NA 136 03/02/2014 1156   K 4.6 03/02/2014 1156   CL 101 03/02/2014 1156   CO2 29 03/02/2014 1156   BUN 27* 03/02/2014 1156   CREATININE 1.20 03/02/2014 1156   CREATININE 1.18 05/29/2013 1142      Component Value Date/Time   CALCIUM 9.3 03/02/2014 1156   ALKPHOS 66 03/02/2014 1156   AST 34 03/02/2014 1156   ALT 34 03/02/2014 1156   BILITOT 0.6 03/02/2014 1156         Impression and Plan: [name] is a 41- gentleman with  hemochromatosis. He is doing quite well.  We will see what his ferritin is. He typically gets phlebotomized once a year or so. I will think that his ferritin should be down..  I think we can probably get him back to be seen in 6 months now.   Volanda Napoleon, MD 2/25/201610:16 AM

## 2014-03-27 ENCOUNTER — Encounter: Payer: Self-pay | Admitting: *Deleted

## 2014-04-01 ENCOUNTER — Other Ambulatory Visit: Payer: Self-pay | Admitting: Family

## 2014-04-06 MED ORDER — INSULIN GLARGINE 100 UNIT/ML SOLOSTAR PEN: 16.0000 [IU] | PEN_INJECTOR | Freq: Every day | SUBCUTANEOUS | Status: DC

## 2014-04-06 NOTE — Addendum Note (Signed)
Addended by: [name] on: 04/06/2014 09:53 AM   Modules accepted: Orders

## 2014-04-14 ENCOUNTER — Encounter: Payer: Self-pay | Admitting: Family

## 2014-04-14 MED ORDER — INSULIN GLARGINE 100 UNIT/ML SOLOSTAR PEN: 19.0000 [IU] | PEN_INJECTOR | Freq: Every day | SUBCUTANEOUS | Status: DC

## 2014-04-15 ENCOUNTER — Other Ambulatory Visit: Payer: Self-pay | Admitting: Family

## 2014-04-23 ENCOUNTER — Other Ambulatory Visit: Payer: Self-pay | Admitting: Family

## 2014-04-23 ENCOUNTER — Ambulatory Visit (INDEPENDENT_AMBULATORY_CARE_PROVIDER_SITE_OTHER): Payer: Medicare Other | Admitting: Pharmacist Clinician (PhC)/ Clinical Pharmacy Specialist

## 2014-04-23 DIAGNOSIS — I359 Nonrheumatic aortic valve disorder, unspecified: Secondary | ICD-10-CM | POA: Diagnosis not present

## 2014-04-23 DIAGNOSIS — Z5181 Encounter for therapeutic drug level monitoring: Secondary | ICD-10-CM | POA: Diagnosis not present

## 2014-04-23 DIAGNOSIS — Z952 Presence of prosthetic heart valve: Secondary | ICD-10-CM

## 2014-04-23 DIAGNOSIS — Z954 Presence of other heart-valve replacement: Secondary | ICD-10-CM | POA: Diagnosis not present

## 2014-04-23 LAB — POCT INR: INR: 3.2

## 2014-04-28 ENCOUNTER — Encounter: Payer: Self-pay | Admitting: Family

## 2014-05-02 ENCOUNTER — Encounter: Payer: Self-pay | Admitting: Family

## 2014-05-11 ENCOUNTER — Encounter: Payer: Self-pay | Admitting: Family

## 2014-05-20 ENCOUNTER — Encounter: Payer: Self-pay | Admitting: Family

## 2014-05-21 ENCOUNTER — Ambulatory Visit (INDEPENDENT_AMBULATORY_CARE_PROVIDER_SITE_OTHER): Payer: Medicare Other | Admitting: *Deleted

## 2014-05-21 DIAGNOSIS — Z5181 Encounter for therapeutic drug level monitoring: Secondary | ICD-10-CM

## 2014-05-21 DIAGNOSIS — I359 Nonrheumatic aortic valve disorder, unspecified: Secondary | ICD-10-CM | POA: Diagnosis not present

## 2014-05-21 DIAGNOSIS — Z952 Presence of prosthetic heart valve: Secondary | ICD-10-CM

## 2014-05-21 DIAGNOSIS — Z954 Presence of other heart-valve replacement: Secondary | ICD-10-CM

## 2014-05-21 LAB — POCT INR: INR: 2.7

## 2014-05-26 ENCOUNTER — Encounter: Payer: Self-pay | Admitting: Family

## 2014-06-02 ENCOUNTER — Telehealth: Payer: Self-pay | Admitting: Family

## 2014-06-02 ENCOUNTER — Encounter: Payer: Self-pay | Admitting: Family

## 2014-06-02 ENCOUNTER — Telehealth: Payer: Self-pay | Admitting: Cardiology

## 2014-06-02 ENCOUNTER — Ambulatory Visit (INDEPENDENT_AMBULATORY_CARE_PROVIDER_SITE_OTHER): Payer: Medicare Other | Admitting: Family

## 2014-06-02 VITALS — BP 138/84 | HR 65 | Temp 98.0°F | Resp 16 | Ht 70.0 in | Wt 233.0 lb

## 2014-06-02 DIAGNOSIS — I059 Rheumatic mitral valve disease, unspecified: Secondary | ICD-10-CM

## 2014-06-02 DIAGNOSIS — E1165 Type 2 diabetes mellitus with hyperglycemia: Secondary | ICD-10-CM

## 2014-06-02 DIAGNOSIS — R079 Chest pain, unspecified: Secondary | ICD-10-CM

## 2014-06-02 DIAGNOSIS — E118 Type 2 diabetes mellitus with unspecified complications: Secondary | ICD-10-CM

## 2014-06-02 DIAGNOSIS — IMO0002 Reserved for concepts with insufficient information to code with codable children: Secondary | ICD-10-CM

## 2014-06-02 DIAGNOSIS — I359 Nonrheumatic aortic valve disorder, unspecified: Secondary | ICD-10-CM

## 2014-06-02 DIAGNOSIS — E119 Type 2 diabetes mellitus without complications: Secondary | ICD-10-CM

## 2014-06-02 DIAGNOSIS — E781 Pure hyperglyceridemia: Secondary | ICD-10-CM

## 2014-06-02 LAB — BASIC METABOLIC PANEL
BUN: 18 mg/dL (ref 6–23)
CALCIUM: 9.5 mg/dL (ref 8.4–10.5)
CO2: 28 meq/L (ref 19–32)
Chloride: 101 mEq/L (ref 96–112)
Creatinine, Ser: 1.16 mg/dL (ref 0.40–1.50)
GFR: 66.31 mL/min (ref >60.00)
GLUCOSE: 148 mg/dL — AB (ref 70–99)
Potassium: 4.3 mEq/L (ref 3.5–5.1)
Sodium: 135 mEq/L (ref 135–145)

## 2014-06-02 LAB — HEMOGLOBIN A1C: Hgb A1c MFr Bld: 7.8 % — ABNORMAL HIGH (ref 4.6–6.5)

## 2014-06-02 MED ORDER — ATORVASTATIN CALCIUM 20 MG PO TABS: ORAL_TABLET | ORAL | Status: DC

## 2014-06-02 MED ORDER — METOPROLOL SUCCINATE ER 25 MG PO TB24: 12.5000 mg | ORAL_TABLET | Freq: Every day | ORAL | Status: DC

## 2014-06-02 MED ORDER — METFORMIN HCL ER 500 MG PO TB24: ORAL_TABLET | ORAL | Status: DC

## 2014-06-02 MED ORDER — GLUCOSE BLOOD VI STRP: ORAL_STRIP | Status: DC

## 2014-06-02 MED ORDER — OLMESARTAN MEDOXOMIL-HCTZ 20-12.5 MG PO TABS: 0.5000 | ORAL_TABLET | Freq: Every day | ORAL | Status: DC

## 2014-06-02 NOTE — Patient Instructions (Signed)
You will be contacted about your stress test and follow up with Dr. Stanford Breed. Let me know if you have not heard within 1 week. Go to the ER if you develop recurrent chest pain. Complete lab work prior to leaving. Follow up n 3 months.

## 2014-06-02 NOTE — Progress Notes (Signed)
Pre visit review using our clinic review tool, if applicable. No additional management support is needed unless otherwise documented below in the visit note. 

## 2014-06-02 NOTE — Progress Notes (Signed)
Subjective:    Patient ID: [name], male    DOB: 1945-03-28, 69 y.o.   MRN: [id number]  HPI   Mr. Bohanon is a 69 yr old male who presents today for routine follow up.  Patient presents today for follow up of multiple medical problems.  Diabetes Type 2  Pt is currently maintained on the following medications for diabetes: lantus 19 units at bedtime. Metformin xr 2000 mg/day  Lab Results  Component Value Date   HGBA1C 7.7* 03/02/2014   HGBA1C 6.3 11/28/2013   HGBA1C 6.9* 05/22/2013    Lab Results  Component Value Date   MICROALBUR 3.79* 02/12/2013   LDLCALC 54 11/25/2010   CREATININE 1.2 03/26/2014    Last diabetic eye exam was  Lab Results  Component Value Date   HMDIABEYEEXA no diabetic retinopathy--[facility] 03/17/2011  Denies hypoglycemia Home glucose readings range 110-140  Hyperlipidemia  Patient is currently maintained on the following medication for hyperlipidemia: lipitor Last lipid panel as follows:  Lab Results  Component Value Date   CHOL 133 03/02/2014   HDL 31.70* 03/02/2014   LDLCALC 54 11/25/2010   LDLDIRECT 63.0 03/02/2014   TRIG 346.0* 03/02/2014   CHOLHDL 4 03/02/2014    Patient reports improved compliance with low fat/low cholesterol diet.   Hypertension  Patient is currently maintained on the following medications for blood pressure: toprol xl 25mg  (1/2 tab), benicar hct 20-12.5 (1/2 tab) Patient reports good compliance with blood pressure medications. Patient denies chest pain, shortness of breath.  + swelling. Last 3 blood pressure readings in our office are as follows: BP Readings from Last 3 Encounters:  06/02/14 138/84  03/26/14 141/53  03/02/14 128/84   Reports several episodes in the early Am of chest tightness, no CP with exertion.  Review of Systems  See HPI  Past Medical History  Diagnosis Date  . Allergy   . Hyperlipidemia   . Hypertension   . Cancer 2011    prostate  . Obesity     moderate    . Aortic stenosis   . CAD (coronary artery disease)     cabg  . History of gout   . Heart murmur   . History of hepatitis 1974  . Type II or unspecified type diabetes mellitus without mention of complication, not stated as uncontrolled   . Hemorrhoids   . Sensorineural hearing loss   . Hypertrophic obstructive cardiomyopathy   . Erectile dysfunction   . Hemochromatosis 02/14/2011    History   Social History  . Marital Status: Married    Spouse Name: N/A  . Number of Children: N/A  . Years of Education: N/A   Occupational History  . Not on file.   Social History Main Topics  . Smoking status: Former Smoker    Types: Cigars  . Smokeless tobacco: Never Used     Comment: Never used Tobacco  . Alcohol Use: 0.0 oz/week    0 Standard drinks or equivalent per week     Comment: 4 glasses wine a month  . Drug Use: Not on file  . Sexual Activity: Not on file   Other Topics Concern  . Not on file   Social History Narrative    Past Surgical History  Procedure Laterality Date  . Prostate seed implant  3/12  . Coronary artery bypass graft  10/2003  . Aortic valve replacement      [employer] [facility]  . Hip surgery  2006  right hip  . Cholecystectomy  1990  . Hernia repair  1999    right, inguinal  . Hernia repair  2002    left, inguinal  . Pilonidal cyst excision  1964  . Tonsillectomy  childhood  . Appendectomy  1990    Family History  Problem Relation Age of Onset  . Heart disease Mother   . Stroke Mother   . Heart disease Father   . Coronary artery disease Brother   . Asperger's syndrome Son   . Hyperlipidemia Son   . Cancer Neg Hx     negative for colon cancer    No Known Allergies  Current Outpatient Prescriptions on File Prior to Visit  Medication Sig Dispense Refill  . atorvastatin (LIPITOR) 20 MG tablet TAKE ONE (1) TABLET EACH DAY 30 tablet 2  . BENICAR HCT 20-12.5 MG per tablet TAKE 1/2 TABLET BY MOUTH DAILY 15 tablet 2  . colchicine 0.6 MG  tablet 2 tabs by mouth at start or gout flare, then 1 tab 1 hour later.  May repeat following day if no improvement. 9 tablet 1  . FREESTYLE LITE test strip CHECK BLOOD SUGAR ONCE DAILY 50 each 3  . Insulin Glargine (LANTUS SOLOSTAR) 100 UNIT/ML Solostar Pen Inject 19 Units into the skin daily at 10 pm. 5 pen 5  . Insulin Pen Needle (PEN NEEDLES) 31G X 6 MM MISC Use as directed 100 each 3  . Lancets (FREESTYLE) lancets Use to check blood sugar once a day as directed. 100 each 1  . metFORMIN (GLUCOPHAGE-XR) 500 MG 24 hr tablet TAKE 4 TABLETS BY MOUTH ONCE DAILY WITH BREAKFAST 120 tablet 3  . metoprolol succinate (TOPROL-XL) 25 MG 24 hr tablet TAKE 1/2 TABLET BY MOUTH DAILY 15 tablet 3  . Omega-3 Fatty Acids (FISH OIL) 1000 MG CAPS Take 2 capsules by mouth 2 (two) times daily.      Marland Kitchen warfarin (COUMADIN) 7.5 MG tablet TAKE 1 AND 1/2 TABLETS BY MOUTH DAILY ORAS DIRECTED BY COUMADIN CLINIC 45 tablet 5   No current facility-administered medications on file prior to visit.    BP 138/84 mmHg  Pulse 65  Temp(Src) 98 F (36.7 C) (Oral)  Resp 16  Ht 5\' 10"  (1.778 m)  Wt 233 lb (105.688 kg)  BMI 33.43 kg/m2  SpO2 99%       Objective:   Physical Exam  Constitutional: He is oriented to person, place, and time. He appears well-developed and well-nourished. No distress.  HENT:  Head: Normocephalic and atraumatic.  Cardiovascular: Normal rate and regular rhythm.   No murmur heard. Pulmonary/Chest: Effort normal and breath sounds normal. No respiratory distress. He has no wheezes. He has no rales.  Musculoskeletal:  2+ Bilateral pedal edema  Neurological: He is alert and oriented to person, place, and time.  Skin: Skin is warm and dry.  Psychiatric: He has a normal mood and affect. His behavior is normal. Thought content normal.          Assessment & Plan:

## 2014-06-02 NOTE — Assessment & Plan Note (Signed)
LDL at goal, trigs were elevated and should continue to improve as his sugars come down. Continue statin.

## 2014-06-02 NOTE — Telephone Encounter (Signed)
Patient can see dr Stanford Breed tomorrow in [place] Will forward to the schedulers to contact the patient

## 2014-06-02 NOTE — Assessment & Plan Note (Signed)
Intermittent and occurs at rest. None today. EKG is performed today and personally reviewed.  Note is made of New TWI V2 and V3.  Will arrange follow up with cardiology and also order stress test for further evaluation.  Pt is instructed to go to the ER if he develops recurrent chest pain.

## 2014-06-02 NOTE — Assessment & Plan Note (Signed)
Sugars have been good at home. Continue lantus, metformin, obtain a1c and bmet.

## 2014-06-02 NOTE — Telephone Encounter (Signed)
Dr. Stanford Breed, this pt is overdue to see you in follow up and I have placed referral for him to be rescheduled with you. He reported intermittent (rare) substernal CP at rest.  EKG appears changed compared to 2014.  I have also placed stress test order.  Please let me know if you have any other recommendations at this time.  Thank you,  [name] NP

## 2014-06-02 NOTE — Telephone Encounter (Signed)
Left message on home and cell phone for patient to call and schedule appt with Dr. Stanford Breed in [place] office.

## 2014-06-02 NOTE — Telephone Encounter (Signed)
Make sure patient has ov with me or PA Kirk Ruths

## 2014-06-03 ENCOUNTER — Encounter: Payer: Self-pay | Admitting: Cardiology

## 2014-06-03 ENCOUNTER — Ambulatory Visit (INDEPENDENT_AMBULATORY_CARE_PROVIDER_SITE_OTHER): Payer: Medicare Other | Admitting: Cardiology

## 2014-06-03 ENCOUNTER — Encounter: Payer: Self-pay | Admitting: *Deleted

## 2014-06-03 ENCOUNTER — Encounter: Payer: Self-pay | Admitting: Family

## 2014-06-03 VITALS — BP 140/72 | HR 75 | Ht 70.0 in | Wt 233.8 lb

## 2014-06-03 DIAGNOSIS — I421 Obstructive hypertrophic cardiomyopathy: Secondary | ICD-10-CM

## 2014-06-03 DIAGNOSIS — R079 Chest pain, unspecified: Secondary | ICD-10-CM | POA: Diagnosis not present

## 2014-06-03 DIAGNOSIS — I2510 Atherosclerotic heart disease of native coronary artery without angina pectoris: Secondary | ICD-10-CM | POA: Diagnosis not present

## 2014-06-03 NOTE — Assessment & Plan Note (Signed)
Blood pressure controlled. Continue present medications. 

## 2014-06-03 NOTE — Assessment & Plan Note (Signed)
Symptoms are atypical. He does have lateral T-wave inversion which is new. Plan stress nuclear study for risk stratification.

## 2014-06-03 NOTE — Assessment & Plan Note (Signed)
Continue SBE prophylaxis. 

## 2014-06-03 NOTE — Assessment & Plan Note (Signed)
Status post septal myectomy

## 2014-06-03 NOTE — Progress Notes (Signed)
HPI: 69 year old male previously followed by Dr. Lia Foyer for followup of coronary artery disease. Patient is status post coronary artery bypassing graft (LIMA to the LAD, saphenous vein graft to the marginal, saphenous vein graft sequentially to the right coronary artery and distal circumflex) as well as aortic valve replacement (St[employer] Jude aortic valve) in 2005. He also had a septal myectomy at that time. Carotid Dopplers in May of 2013 showed 0-39% bilateral stenosis. Echocardiogram January 2015 showed normal LV function, grade 2 diastolic dysfunction, mechanical aortic valve with trace aortic insufficiency and mild to moderate mitral regurgitation. Mean gradient across aortic valve 12 mmHg. Abdominal CT April 2015 showed no aneurysm.  Nuclear study January 2015 showed ejection fraction 56%. No ischemia. Since last seen,  He has no dyspnea on exertion, orthopnea, PND, pedal edema, syncope. He has been exercising since January. Each morning he feels a vague discomfort in his chest transiently for 5-10 minutes. It is not pleuritic, positional or related to food. However he exercises routinely with no chest pain.  Current Outpatient Prescriptions  Medication Sig Dispense Refill  . atorvastatin (LIPITOR) 20 MG tablet TAKE ONE (1) TABLET EACH DAY 90 tablet 1  . colchicine 0.6 MG tablet 2 tabs by mouth at start or gout flare, then 1 tab 1 hour later.  May repeat following day if no improvement. 9 tablet 1  . glucose blood (FREESTYLE LITE) test strip CHECK BLOOD SUGAR ONCE DAILY 100 each 1  . Insulin Glargine (LANTUS SOLOSTAR) 100 UNIT/ML Solostar Pen Inject 19 Units into the skin daily at 10 pm. 5 pen 5  . Insulin Pen Needle (PEN NEEDLES) 31G X 6 MM MISC Use as directed 100 each 3  . Lancets (FREESTYLE) lancets Use to check blood sugar once a day as directed. 100 each 1  . metFORMIN (GLUCOPHAGE-XR) 500 MG 24 hr tablet TAKE 4 TABLETS BY MOUTH ONCE DAILY WITH BREAKFAST 360 tablet 1  . metoprolol  succinate (TOPROL-XL) 25 MG 24 hr tablet Take 0.5 tablets (12.5 mg total) by mouth daily. 45 tablet 1  . olmesartan-hydrochlorothiazide (BENICAR HCT) 20-12.5 MG per tablet Take 0.5 tablets by mouth daily. 45 tablet 1  . Omega-3 Fatty Acids (FISH OIL) 1000 MG CAPS Take 2 capsules by mouth 2 (two) times daily.      Marland Kitchen warfarin (COUMADIN) 7.5 MG tablet TAKE 1 AND 1/2 TABLETS BY MOUTH DAILY ORAS DIRECTED BY COUMADIN CLINIC 45 tablet 5   No current facility-administered medications for this visit.     Past Medical History  Diagnosis Date  . Allergy   . Hyperlipidemia   . Hypertension   . Cancer 2011    prostate  . Obesity     moderate  . Aortic stenosis   . CAD (coronary artery disease)     cabg  . History of gout   . Heart murmur   . History of hepatitis 1974  . Type II or unspecified type diabetes mellitus without mention of complication, not stated as uncontrolled   . Hemorrhoids   . Sensorineural hearing loss   . Hypertrophic obstructive cardiomyopathy   . Erectile dysfunction   . Hemochromatosis 02/14/2011    Past Surgical History  Procedure Laterality Date  . Prostate seed implant  3/12  . Coronary artery bypass graft  10/2003  . Aortic valve replacement      [employer] [facility]  . Hip surgery  2006    right hip  . Cholecystectomy  1990  . Hernia  repair  1999    right, inguinal  . Hernia repair  2002    left, inguinal  . Pilonidal cyst excision  1964  . Tonsillectomy  childhood  . Appendectomy  1990    History   Social History  . Marital Status: Married    Spouse Name: N/A  . Number of Children: N/A  . Years of Education: N/A   Occupational History  . Not on file.   Social History Main Topics  . Smoking status: Former Smoker    Types: Cigars  . Smokeless tobacco: Never Used     Comment: Never used Tobacco  . Alcohol Use: 0.0 oz/week    0 Standard drinks or equivalent per week     Comment: 4 glasses wine a month  . Drug Use: Not on file  . Sexual  Activity: Not on file   Other Topics Concern  . Not on file   Social History Narrative    ROS: no fevers or chills, productive cough, hemoptysis, dysphasia, odynophagia, melena, hematochezia, dysuria, hematuria, rash, seizure activity, orthopnea, PND, pedal edema, claudication. Remaining systems are negative.  Physical Exam: Well-developed well-nourished in no acute distress.  Skin is warm and dry.  HEENT is normal.  Neck is supple.  Chest is clear to auscultation with normal expansion.  Cardiovascular exam is regular rate and rhythm. Crisp mechanical valve sounds Abdominal exam nontender or distended. No masses palpated. Extremities show no edema. neuro grossly intact  ECG 06/02/2014-sinus rhythm, cannot rule out septal infarct, lateral T-wave inversion.

## 2014-06-03 NOTE — Assessment & Plan Note (Signed)
Continue statin. 

## 2014-06-03 NOTE — Patient Instructions (Addendum)
Your physician recommends that you schedule a follow-up appointment in: [facility] has requested that you have en exercise stress myoview. For further information please visit [email or web address]. Please follow instruction sheet, as given.

## 2014-06-05 ENCOUNTER — Ambulatory Visit (HOSPITAL_COMMUNITY)
Admission: RE | Admit: 2014-06-05 | Discharge: 2014-06-05 | Disposition: A | Discharge status: Home / Self Care | Payer: Medicare Other | Source: Ambulatory Visit | Attending: Cardiology | Admitting: Cardiology

## 2014-06-05 ENCOUNTER — Encounter (HOSPITAL_COMMUNITY): Payer: Federal, State, Local not specified - PPO

## 2014-06-05 DIAGNOSIS — I2510 Atherosclerotic heart disease of native coronary artery without angina pectoris: Secondary | ICD-10-CM | POA: Insufficient documentation

## 2014-06-05 DIAGNOSIS — Z952 Presence of prosthetic heart valve: Secondary | ICD-10-CM | POA: Insufficient documentation

## 2014-06-05 DIAGNOSIS — Z951 Presence of aortocoronary bypass graft: Secondary | ICD-10-CM | POA: Diagnosis not present

## 2014-06-05 DIAGNOSIS — R079 Chest pain, unspecified: Secondary | ICD-10-CM | POA: Diagnosis not present

## 2014-06-05 LAB — MYOCARDIAL PERFUSION IMAGING
CHL CUP NUCLEAR SSS: 7
CHL CUP RESTING HR STRESS: 73 {beats}/min
CSEPED: 8 min
CSEPEW: 10.1 METS
LV dias vol: 86 mL
LVSYSVOL: 37 mL
MPHR: 151 {beats}/min
NUC STRESS EF: 57 %
NUC STRESS TID: 1.27
Peak HR: 157 {beats}/min
Percent HR: 103 %
RPE: 28016
SDS: 2
SRS: 6

## 2014-06-05 MED ORDER — TECHNETIUM TC 99M SESTAMIBI GENERIC - CARDIOLITE
10.9000 | Freq: Once | INTRAVENOUS | Status: AC | PRN
  Administered 2014-06-05: 10.9 via INTRAVENOUS

## 2014-06-05 MED ORDER — TECHNETIUM TC 99M SESTAMIBI GENERIC - CARDIOLITE
30.8000 | Freq: Once | INTRAVENOUS | Status: AC | PRN
  Administered 2014-06-05: 30.8 via INTRAVENOUS

## 2014-06-09 ENCOUNTER — Encounter (HOSPITAL_COMMUNITY): Payer: Self-pay | Admitting: *Deleted

## 2014-06-13 ENCOUNTER — Encounter: Payer: Self-pay | Admitting: Family

## 2014-07-03 ENCOUNTER — Ambulatory Visit (INDEPENDENT_AMBULATORY_CARE_PROVIDER_SITE_OTHER): Payer: Medicare Other | Admitting: *Deleted

## 2014-07-03 DIAGNOSIS — Z954 Presence of other heart-valve replacement: Secondary | ICD-10-CM | POA: Diagnosis not present

## 2014-07-03 DIAGNOSIS — I359 Nonrheumatic aortic valve disorder, unspecified: Secondary | ICD-10-CM

## 2014-07-03 DIAGNOSIS — Z5181 Encounter for therapeutic drug level monitoring: Secondary | ICD-10-CM

## 2014-07-03 DIAGNOSIS — Z952 Presence of prosthetic heart valve: Secondary | ICD-10-CM

## 2014-07-03 LAB — POCT INR: INR: 3.2

## 2014-07-07 ENCOUNTER — Encounter: Payer: Self-pay | Admitting: Family

## 2014-07-20 ENCOUNTER — Encounter: Payer: Self-pay | Admitting: Family

## 2014-07-21 DIAGNOSIS — L570 Actinic keratosis: Secondary | ICD-10-CM | POA: Diagnosis not present

## 2014-07-27 ENCOUNTER — Other Ambulatory Visit: Payer: Self-pay

## 2014-07-27 ENCOUNTER — Other Ambulatory Visit: Payer: Self-pay | Admitting: Family

## 2014-08-07 ENCOUNTER — Ambulatory Visit (INDEPENDENT_AMBULATORY_CARE_PROVIDER_SITE_OTHER): Payer: Medicare Other | Admitting: *Deleted

## 2014-08-07 DIAGNOSIS — Z952 Presence of prosthetic heart valve: Secondary | ICD-10-CM

## 2014-08-07 DIAGNOSIS — Z5181 Encounter for therapeutic drug level monitoring: Secondary | ICD-10-CM

## 2014-08-07 DIAGNOSIS — Z954 Presence of other heart-valve replacement: Secondary | ICD-10-CM

## 2014-08-07 DIAGNOSIS — I359 Nonrheumatic aortic valve disorder, unspecified: Secondary | ICD-10-CM

## 2014-08-07 LAB — POCT INR: INR: 3.3

## 2014-08-12 ENCOUNTER — Ambulatory Visit (INDEPENDENT_AMBULATORY_CARE_PROVIDER_SITE_OTHER): Payer: Medicare Other | Admitting: Cardiology

## 2014-08-12 ENCOUNTER — Encounter: Payer: Self-pay | Admitting: Cardiology

## 2014-08-12 ENCOUNTER — Encounter: Payer: Self-pay | Admitting: Family

## 2014-08-12 VITALS — BP 140/50 | HR 72 | Ht 70.0 in | Wt 232.1 lb

## 2014-08-12 DIAGNOSIS — I10 Essential (primary) hypertension: Secondary | ICD-10-CM | POA: Diagnosis not present

## 2014-08-12 DIAGNOSIS — I421 Obstructive hypertrophic cardiomyopathy: Secondary | ICD-10-CM

## 2014-08-12 DIAGNOSIS — I2510 Atherosclerotic heart disease of native coronary artery without angina pectoris: Secondary | ICD-10-CM | POA: Diagnosis not present

## 2014-08-12 DIAGNOSIS — E7800 Pure hypercholesterolemia, unspecified: Secondary | ICD-10-CM

## 2014-08-12 NOTE — Assessment & Plan Note (Signed)
Continue statin.nuclear study shows no ischemia.

## 2014-08-12 NOTE — Patient Instructions (Signed)
Medication Instructions:  -no change  Labwork: none  Testing/Procedures: none  Follow-Up: Your physician wants you to follow-up in: 7 month f/u with Dr. Stanford Breed.  You will receive a reminder letter in the mail two months in advance. If you don't receive a letter, please call our office to schedule the follow-up appointment.   Any Other Special Instructions Will Be Listed Below (If Applicable).

## 2014-08-12 NOTE — Assessment & Plan Note (Signed)
Status post myectomy.

## 2014-08-12 NOTE — Assessment & Plan Note (Signed)
Continue coumadin and SBE prophylaxis.

## 2014-08-12 NOTE — Assessment & Plan Note (Signed)
Continue statin. 

## 2014-08-12 NOTE — Assessment & Plan Note (Signed)
Continue present blood pressure medications. 

## 2014-08-12 NOTE — Progress Notes (Signed)
HPI: Followup of coronary artery disease. Patient is status post coronary artery bypassing graft (LIMA to the LAD, saphenous vein graft to the marginal, saphenous vein graft sequentially to the right coronary artery and distal circumflex) as well as aortic valve replacement (St. Jude aortic valve) in 2005. He also had a septal myectomy at that time. Carotid Dopplers in May of 2013 showed 0-39% bilateral stenosis. Echocardiogram January 2015 showed normal LV function, grade 2 diastolic dysfunction, mechanical aortic valve with trace aortic insufficiency and mild to moderate mitral regurgitation. Mean gradient across aortic valve 12 mmHg. Abdominal CT April 2015 showed no aneurysm. Nuclear study May 2016 showed normal perfusion and normal LV function. Since last seen, he occasionally has brief chest tightness in the morning lasting 30 seconds. Otherwise he has chronic dyspnea on exertion but no orthopnea or PND. No exertional chest pain.  Current Outpatient Prescriptions  Medication Sig Dispense Refill  . atorvastatin (LIPITOR) 20 MG tablet TAKE ONE (1) TABLET EACH DAY 90 tablet 1  . colchicine 0.6 MG tablet 2 tabs by mouth at start or gout flare, then 1 tab 1 hour later.  May repeat following day if no improvement. 9 tablet 1  . glucose blood (FREESTYLE LITE) test strip CHECK BLOOD SUGAR ONCE DAILY 100 each 1  . Insulin Glargine (LANTUS SOLOSTAR) 100 UNIT/ML Solostar Pen Inject 19 Units into the skin daily at 10 pm. (Patient taking differently: Inject 21 Units into the skin daily at 10 pm. ) 5 pen 5  . Insulin Pen Needle (PEN NEEDLES) 31G X 6 MM MISC Use as directed 100 each 3  . Lancets (FREESTYLE) lancets Use to check blood sugar once a day as directed. 100 each 1  . metFORMIN (GLUCOPHAGE) 500 MG tablet TAKE TWO (2) TABLETS BY MOUTH 2 TIMES DAILY 120 tablet 6  . metoprolol succinate (TOPROL-XL) 25 MG 24 hr tablet Take 0.5 tablets (12.5 mg total) by mouth daily. 45 tablet 1  .  olmesartan-hydrochlorothiazide (BENICAR HCT) 20-12.5 MG per tablet Take 0.5 tablets by mouth daily. 45 tablet 1  . Omega-3 Fatty Acids (FISH OIL) 1000 MG CAPS Take 1 capsule by mouth daily.     Marland Kitchen warfarin (COUMADIN) 7.5 MG tablet TAKE 1 AND 1/2 TABLETS BY MOUTH DAILY ORAS DIRECTED BY COUMADIN CLINIC 45 tablet 5   No current facility-administered medications for this visit.     Past Medical History  Diagnosis Date  . Allergy   . Hyperlipidemia   . Hypertension   . Cancer 2011    prostate  . Obesity     moderate  . Aortic stenosis   . CAD (coronary artery disease)     cabg  . History of gout   . Heart murmur   . History of hepatitis 1974  . Type II or unspecified type diabetes mellitus without mention of complication, not stated as uncontrolled   . Hemorrhoids   . Sensorineural hearing loss   . Hypertrophic obstructive cardiomyopathy   . Erectile dysfunction   . Hemochromatosis 02/14/2011    Past Surgical History  Procedure Laterality Date  . Prostate seed implant  3/12  . Coronary artery bypass graft  10/2003  . Aortic valve replacement      St Jude [employer]  . Hip surgery  2006    right hip  . Cholecystectomy  1990  . Hernia repair  1999    right, inguinal  . Hernia repair  2002    left, inguinal  .  Pilonidal cyst excision  1964  . Tonsillectomy  childhood  . Appendectomy  1990    History   Social History  . Marital Status: Married    Spouse Name: N/A  . Number of Children: N/A  . Years of Education: N/A   Occupational History  . Not on file.   Social History Main Topics  . Smoking status: Former Smoker    Types: Cigars  . Smokeless tobacco: Never Used     Comment: Never used Tobacco  . Alcohol Use: 0.0 oz/week    0 Standard drinks or equivalent per week     Comment: 4 glasses wine a month  . Drug Use: Not on file  . Sexual Activity: Not on file   Other Topics Concern  . Not on file   Social History Narrative    ROS: no fevers or chills,  productive cough, hemoptysis, dysphasia, odynophagia, melena, hematochezia, dysuria, hematuria, rash, seizure activity, orthopnea, PND, pedal edema, claudication. Remaining systems are negative.  Physical Exam: Well-developed well-nourished in no acute distress.  Skin is warm and dry.  HEENT is normal.  Neck is supple.  Chest is clear to auscultation with normal expansion.  Cardiovascular exam is regular rate and rhythm.  Abdominal exam nontender or distended. No masses palpated. Extremities show trace edema. neuro grossly intact

## 2014-08-20 ENCOUNTER — Ambulatory Visit (INDEPENDENT_AMBULATORY_CARE_PROVIDER_SITE_OTHER): Payer: Medicare Other | Admitting: Pharmacist

## 2014-08-20 DIAGNOSIS — Z954 Presence of other heart-valve replacement: Secondary | ICD-10-CM

## 2014-08-20 DIAGNOSIS — Z5181 Encounter for therapeutic drug level monitoring: Secondary | ICD-10-CM

## 2014-08-20 DIAGNOSIS — I359 Nonrheumatic aortic valve disorder, unspecified: Secondary | ICD-10-CM

## 2014-08-20 DIAGNOSIS — Z952 Presence of prosthetic heart valve: Secondary | ICD-10-CM

## 2014-08-20 LAB — POCT INR: INR: 1.9

## 2014-08-25 ENCOUNTER — Encounter: Payer: Self-pay | Admitting: Family

## 2014-08-25 MED ORDER — COLCHICINE 0.6 MG PO TABS: ORAL_TABLET | ORAL | Status: DC

## 2014-08-31 ENCOUNTER — Encounter: Payer: Self-pay | Admitting: Family

## 2014-09-01 ENCOUNTER — Ambulatory Visit (INDEPENDENT_AMBULATORY_CARE_PROVIDER_SITE_OTHER): Payer: Medicare Other | Admitting: Family

## 2014-09-01 ENCOUNTER — Encounter: Payer: Self-pay | Admitting: Family

## 2014-09-01 VITALS — BP 126/78 | HR 79 | Temp 97.8°F | Resp 16 | Ht 70.0 in | Wt 227.8 lb

## 2014-09-01 DIAGNOSIS — E118 Type 2 diabetes mellitus with unspecified complications: Secondary | ICD-10-CM

## 2014-09-01 DIAGNOSIS — E785 Hyperlipidemia, unspecified: Secondary | ICD-10-CM

## 2014-09-01 DIAGNOSIS — E1165 Type 2 diabetes mellitus with hyperglycemia: Secondary | ICD-10-CM

## 2014-09-01 DIAGNOSIS — I10 Essential (primary) hypertension: Secondary | ICD-10-CM

## 2014-09-01 DIAGNOSIS — I2510 Atherosclerotic heart disease of native coronary artery without angina pectoris: Secondary | ICD-10-CM

## 2014-09-01 DIAGNOSIS — IMO0002 Reserved for concepts with insufficient information to code with codable children: Secondary | ICD-10-CM

## 2014-09-01 DIAGNOSIS — E7800 Pure hypercholesterolemia, unspecified: Secondary | ICD-10-CM

## 2014-09-01 DIAGNOSIS — R490 Dysphonia: Secondary | ICD-10-CM | POA: Diagnosis not present

## 2014-09-01 MED ORDER — OMEPRAZOLE 40 MG PO CPDR: 40.0000 mg | DELAYED_RELEASE_CAPSULE | Freq: Every day | ORAL | Status: DC

## 2014-09-01 NOTE — Assessment & Plan Note (Signed)
Trial of PPI. Advised pt to let me know if hoarseness does not improve with PPI. He is already established with ENT and we can get him back in if no improvement.

## 2014-09-01 NOTE — Assessment & Plan Note (Signed)
Stable on current meds. Continue same. Obtain bmet.

## 2014-09-01 NOTE — Progress Notes (Signed)
Pre visit review using our clinic review tool, if applicable. No additional management support is needed unless otherwise documented below in the visit note. 

## 2014-09-01 NOTE — Progress Notes (Signed)
Subjective:    Patient ID: [name], male    DOB: July 21, 1945, 69 y.o.   MRN: [id number]  HPI  [name] is a 69 yr old male who presents today for routine follow up.  Patient presents today for follow up of multiple medical problems.  Diabetes Type 2  Pt is currently maintained on the following medications for diabetes:lantus, metformin  Lab Results  Component Value Date   HGBA1C 7.8* 06/02/2014   HGBA1C 7.7* 03/02/2014   HGBA1C 6.3 11/28/2013    Lab Results  Component Value Date   MICROALBUR 3.79* 02/12/2013   LDLCALC 54 11/25/2010   CREATININE 1.16 06/02/2014    Last diabetic eye exam was  Lab Results  Component Value Date   HMDIABEYEEXA no diabetic retinopathy--[facility] 03/17/2011   Denies polyuria/polydipsia. Denies hypoglycemia Home glucose readings range 125-153  Hyperlipidemia  Patient is currently maintained on the following medication for hyperlipidemia: lipitor 20mg  Last lipid panel as follows:  Lab Results  Component Value Date   CHOL 133 03/02/2014   HDL 31.70* 03/02/2014   LDLCALC 54 11/25/2010   LDLDIRECT 63.0 03/02/2014   TRIG 346.0* 03/02/2014   CHOLHDL 4 03/02/2014   Patient denies myalgia. Patient reports good compliance with low fat/low cholesterol diet.   Hypertension  Patient is currently maintained on the following medications for blood pressure: benicar hct, toprol xl Patient reports good compliance with blood pressure medications. Patient denies chest pain, shortness of breath or swelling.  1 month hx of voice hoarseness. Denies sinus drainage.   Review of Systems    see HPI  Past Medical History  Diagnosis Date  . Allergy   . Hyperlipidemia   . Hypertension   . Cancer 2011    prostate  . Obesity     moderate  . Aortic stenosis   . CAD (coronary artery disease)     cabg  . History of gout   . Heart murmur   . History of hepatitis 1974  . Type II or unspecified type diabetes mellitus without  mention of complication, not stated as uncontrolled   . Hemorrhoids   . Sensorineural hearing loss   . Hypertrophic obstructive cardiomyopathy   . Erectile dysfunction   . Hemochromatosis 02/14/2011    History   Social History  . Marital Status: Married    Spouse Name: N/A  . Number of Children: N/A  . Years of Education: N/A   Occupational History  . Not on file.   Social History Main Topics  . Smoking status: Former Smoker    Types: Cigars  . Smokeless tobacco: Never Used     Comment: Never used Tobacco  . Alcohol Use: 0.0 oz/week    0 Standard drinks or equivalent per week     Comment: 4 glasses wine a month  . Drug Use: Not on file  . Sexual Activity: Not on file   Other Topics Concern  . Not on file   Social History Narrative    Past Surgical History  Procedure Laterality Date  . Prostate seed implant  3/12  . Coronary artery bypass graft  10/2003  . Aortic valve replacement      St Jude [employer]  . Hip surgery  2006    right hip  . Cholecystectomy  1990  . Hernia repair  1999    right, inguinal  . Hernia repair  2002    left, inguinal  . Pilonidal cyst excision  1964  . Tonsillectomy  childhood  . Appendectomy  1990    Family History  Problem Relation Age of Onset  . Heart disease Mother   . Stroke Mother   . Heart disease Father   . Coronary artery disease Brother   . Asperger's syndrome Son   . Hyperlipidemia Son   . Cancer Neg Hx     negative for colon cancer    No Known Allergies  Current Outpatient Prescriptions on File Prior to Visit  Medication Sig Dispense Refill  . atorvastatin (LIPITOR) 20 MG tablet TAKE ONE (1) TABLET EACH DAY 90 tablet 1  . colchicine 0.6 MG tablet 2 tabs by mouth at start or gout flare, then 1 tab 1 hour later.  May repeat following day if no improvement. 9 tablet 5  . glucose blood (FREESTYLE LITE) test strip CHECK BLOOD SUGAR ONCE DAILY 100 each 1  . Insulin Glargine (LANTUS SOLOSTAR) 100 UNIT/ML Solostar Pen  Inject 19 Units into the skin daily at 10 pm. (Patient taking differently: Inject 21 Units into the skin daily at 10 pm. ) 5 pen 5  . Insulin Pen Needle (PEN NEEDLES) 31G X 6 MM MISC Use as directed 100 each 3  . Lancets (FREESTYLE) lancets Use to check blood sugar once a day as directed. 100 each 1  . metFORMIN (GLUCOPHAGE) 500 MG tablet TAKE TWO (2) TABLETS BY MOUTH 2 TIMES DAILY 120 tablet 6  . metoprolol succinate (TOPROL-XL) 25 MG 24 hr tablet Take 0.5 tablets (12.5 mg total) by mouth daily. 45 tablet 1  . olmesartan-hydrochlorothiazide (BENICAR HCT) 20-12.5 MG per tablet Take 0.5 tablets by mouth daily. 45 tablet 1  . Omega-3 Fatty Acids (FISH OIL) 1000 MG CAPS Take 1 capsule by mouth daily.     Marland Kitchen warfarin (COUMADIN) 7.5 MG tablet TAKE 1 AND 1/2 TABLETS BY MOUTH DAILY ORAS DIRECTED BY COUMADIN CLINIC 45 tablet 5   No current facility-administered medications on file prior to visit.    There were no vitals taken for this visit.    Objective:   Physical Exam  Constitutional: He is oriented to person, place, and time. He appears well-developed and well-nourished. No distress.  HENT:  Head: Normocephalic and atraumatic.  Cardiovascular: Normal rate and regular rhythm.   No murmur heard. Pulmonary/Chest: Effort normal and breath sounds normal. No respiratory distress. He has no wheezes. He has no rales.  Musculoskeletal:  1+ Bilateral LE edema  Neurological: He is alert and oriented to person, place, and time.  Skin: Skin is warm and dry.  Psychiatric: He has a normal mood and affect. His behavior is normal. Thought content normal.          Assessment & Plan:

## 2014-09-01 NOTE — Assessment & Plan Note (Addendum)
Trigs high last visit. Obtain follow up flp. Cont statin.

## 2014-09-01 NOTE — Assessment & Plan Note (Signed)
Fair control on current regimen, continue same, obtain urine micro and A1C.

## 2014-09-01 NOTE — Patient Instructions (Signed)
Please schedule lab visit at the front desk for next week. Follow up in 4 months.

## 2014-09-03 ENCOUNTER — Ambulatory Visit (INDEPENDENT_AMBULATORY_CARE_PROVIDER_SITE_OTHER): Payer: Medicare Other | Admitting: *Deleted

## 2014-09-03 DIAGNOSIS — I359 Nonrheumatic aortic valve disorder, unspecified: Secondary | ICD-10-CM

## 2014-09-03 DIAGNOSIS — Z954 Presence of other heart-valve replacement: Secondary | ICD-10-CM

## 2014-09-03 DIAGNOSIS — Z5181 Encounter for therapeutic drug level monitoring: Secondary | ICD-10-CM

## 2014-09-03 DIAGNOSIS — Z952 Presence of prosthetic heart valve: Secondary | ICD-10-CM

## 2014-09-03 LAB — POCT INR: INR: 2.5

## 2014-09-09 ENCOUNTER — Other Ambulatory Visit (INDEPENDENT_AMBULATORY_CARE_PROVIDER_SITE_OTHER): Payer: Medicare Other

## 2014-09-09 DIAGNOSIS — E785 Hyperlipidemia, unspecified: Secondary | ICD-10-CM

## 2014-09-09 DIAGNOSIS — E118 Type 2 diabetes mellitus with unspecified complications: Secondary | ICD-10-CM

## 2014-09-09 LAB — BASIC METABOLIC PANEL
BUN: 32 mg/dL — ABNORMAL HIGH (ref 6–23)
CALCIUM: 9.7 mg/dL (ref 8.4–10.5)
CHLORIDE: 102 meq/L (ref 96–112)
CO2: 27 mEq/L (ref 19–32)
Creatinine, Ser: 1.24 mg/dL (ref 0.40–1.50)
GFR: 61.35 mL/min (ref >60.00)
Glucose, Bld: 104 mg/dL — ABNORMAL HIGH (ref 70–99)
Potassium: 4.4 mEq/L (ref 3.5–5.1)
SODIUM: 139 meq/L (ref 135–145)

## 2014-09-09 LAB — LIPID PANEL
CHOLESTEROL: 135 mg/dL (ref 0–200)
HDL: 31.7 mg/dL — AB (ref >39.00)
NonHDL: 103.21
TRIGLYCERIDES: 297 mg/dL — AB (ref 0.0–149.0)
Total CHOL/HDL Ratio: 4
VLDL: 59.4 mg/dL — ABNORMAL HIGH (ref 0.0–40.0)

## 2014-09-09 LAB — MICROALBUMIN / CREATININE URINE RATIO
CREATININE, U: 173.4 mg/dL
MICROALB/CREAT RATIO: 5 mg/g (ref 0.0–30.0)
Microalb, Ur: 8.7 mg/dL — ABNORMAL HIGH (ref 0.0–1.9)

## 2014-09-09 LAB — HEMOGLOBIN A1C: HEMOGLOBIN A1C: 7.2 % — AB (ref 4.6–6.5)

## 2014-09-09 LAB — LDL CHOLESTEROL, DIRECT: Direct LDL: 61 mg/dL

## 2014-09-10 ENCOUNTER — Telehealth: Payer: Self-pay | Admitting: Family

## 2014-09-10 NOTE — Telephone Encounter (Signed)
See mychart.  

## 2014-09-19 ENCOUNTER — Encounter: Payer: Self-pay | Admitting: Family

## 2014-09-24 ENCOUNTER — Encounter: Payer: Self-pay | Admitting: Hematology & Oncology

## 2014-09-24 ENCOUNTER — Ambulatory Visit (HOSPITAL_BASED_OUTPATIENT_CLINIC_OR_DEPARTMENT_OTHER): Payer: Medicare Other | Admitting: Hematology & Oncology

## 2014-09-24 ENCOUNTER — Ambulatory Visit (INDEPENDENT_AMBULATORY_CARE_PROVIDER_SITE_OTHER): Payer: Medicare Other | Admitting: Pharmacist

## 2014-09-24 ENCOUNTER — Other Ambulatory Visit (HOSPITAL_BASED_OUTPATIENT_CLINIC_OR_DEPARTMENT_OTHER): Payer: Medicare Other

## 2014-09-24 DIAGNOSIS — C61 Malignant neoplasm of prostate: Secondary | ICD-10-CM

## 2014-09-24 DIAGNOSIS — Z952 Presence of prosthetic heart valve: Secondary | ICD-10-CM

## 2014-09-24 DIAGNOSIS — Z5181 Encounter for therapeutic drug level monitoring: Secondary | ICD-10-CM

## 2014-09-24 DIAGNOSIS — I359 Nonrheumatic aortic valve disorder, unspecified: Secondary | ICD-10-CM | POA: Diagnosis not present

## 2014-09-24 DIAGNOSIS — Z954 Presence of other heart-valve replacement: Secondary | ICD-10-CM | POA: Diagnosis not present

## 2014-09-24 LAB — COMPREHENSIVE METABOLIC PANEL (CC13)
ALBUMIN: 3.9 g/dL (ref 3.5–5.0)
ALK PHOS: 66 U/L (ref 40–150)
ALT: 45 U/L (ref 0–55)
AST: 42 U/L — AB (ref 5–34)
Anion Gap: 11 mEq/L (ref 3–11)
BUN: 23.2 mg/dL (ref 7.0–26.0)
CALCIUM: 9 mg/dL (ref 8.4–10.4)
CO2: 21 mEq/L — ABNORMAL LOW (ref 22–29)
CREATININE: 1.2 mg/dL (ref 0.7–1.3)
Chloride: 107 mEq/L (ref 98–109)
EGFR: 59 mL/min/{1.73_m2} — ABNORMAL LOW (ref >90)
GLUCOSE: 210 mg/dL — AB (ref 70–140)
Potassium: 4 mEq/L (ref 3.5–5.1)
SODIUM: 140 meq/L (ref 136–145)
Total Bilirubin: 0.61 mg/dL (ref 0.20–1.20)
Total Protein: 7.2 g/dL (ref 6.4–8.3)

## 2014-09-24 LAB — IRON AND TIBC CHCC
%SAT: 27 % (ref 20–55)
Iron: 100 ug/dL (ref 42–163)
TIBC: 375 ug/dL (ref 202–409)
UIBC: 276 ug/dL (ref 117–376)

## 2014-09-24 LAB — CBC WITH DIFFERENTIAL (CANCER CENTER ONLY)
BASO#: 0.1 10*3/uL (ref 0.0–0.2)
BASO%: 0.8 % (ref 0.0–2.0)
EOS ABS: 0.3 10*3/uL (ref 0.0–0.5)
EOS%: 4.3 % (ref 0.0–7.0)
HEMATOCRIT: 35.4 % — AB (ref 38.7–49.9)
HGB: 12.6 g/dL — ABNORMAL LOW (ref 13.0–17.1)
LYMPH#: 1.2 10*3/uL (ref 0.9–3.3)
LYMPH%: 17 % (ref 14.0–48.0)
MCH: 32.2 pg (ref 28.0–33.4)
MCHC: 35.6 g/dL (ref 32.0–35.9)
MCV: 91 fL (ref 82–98)
MONO#: 0.6 10*3/uL (ref 0.1–0.9)
MONO%: 7.5 % (ref 0.0–13.0)
NEUT#: 5.1 10*3/uL (ref 1.5–6.5)
NEUT%: 70.4 % (ref 40.0–80.0)
PLATELETS: 168 10*3/uL (ref 145–400)
RBC: 3.91 10*6/uL — ABNORMAL LOW (ref 4.20–5.70)
RDW: 12.9 % (ref 11.1–15.7)
WBC: 7.3 10*3/uL (ref 4.0–10.0)

## 2014-09-24 LAB — POCT INR: INR: 2.9

## 2014-09-24 LAB — FERRITIN CHCC: Ferritin: 82 ng/ml (ref 22–316)

## 2014-09-24 NOTE — Progress Notes (Signed)
Hematology and Oncology Follow Up Visit  [name] [id number] 05-23-1945 69 y.o. 09/24/2014   Principle Diagnosis:  1. Hemochromatosis (H63D homozygous mutation). 2. Stage I prostate cancer, status post radiation therapy and     radiation seeds.  Current Therapy:    Phlebotomy to maintain a ferritin less than 100      Interim History:  Mr.  [name] is back for followup. We last saw him back in February. At that point time, his ferritin was 63. His iron saturation was only 26%.Marland Kitchen   He did is doing well. He is exercising. He has enjoyed summer.  His blood sugars are being watched closely.  He's had no problems with cough. Her every said he lost his voice a couple weeks ago. He said it is not getting better.  He's had no problems with bowels or bladder. He's had no leg swelling.  He says that since he has been exercising, he feels better and that his legs are less swollen.  Overall, his performance status is ECOG 1.    Medications:  Current outpatient prescriptions:  .  atorvastatin (LIPITOR) 20 MG tablet, TAKE ONE (1) TABLET EACH DAY, Disp: 90 tablet, Rfl: 1 .  colchicine 0.6 MG tablet, 2 tabs by mouth at start or gout flare, then 1 tab 1 hour later.  May repeat following day if no improvement., Disp: 9 tablet, Rfl: 5 .  glucose blood (FREESTYLE LITE) test strip, CHECK BLOOD SUGAR ONCE DAILY, Disp: 100 each, Rfl: 1 .  Insulin Glargine (LANTUS SOLOSTAR) 100 UNIT/ML Solostar Pen, Inject 19 Units into the skin daily at 10 pm. (Patient taking differently: Inject 21 Units into the skin daily at 10 pm. ), Disp: 5 pen, Rfl: 5 .  Insulin Pen Needle (PEN NEEDLES) 31G X 6 MM MISC, Use as directed, Disp: 100 each, Rfl: 3 .  Lancets (FREESTYLE) lancets, Use to check blood sugar once a day as directed., Disp: 100 each, Rfl: 1 .  metFORMIN (GLUCOPHAGE) 500 MG tablet, TAKE TWO (2) TABLETS BY MOUTH 2 TIMES DAILY, Disp: 120 tablet, Rfl: 6 .  metoprolol succinate (TOPROL-XL) 25 MG 24 hr  tablet, Take 0.5 tablets (12.5 mg total) by mouth daily., Disp: 45 tablet, Rfl: 1 .  olmesartan-hydrochlorothiazide (BENICAR HCT) 20-12.5 MG per tablet, Take 0.5 tablets by mouth daily., Disp: 45 tablet, Rfl: 1 .  Omega-3 Fatty Acids (FISH OIL) 1000 MG CAPS, Take 1 capsule by mouth daily. , Disp: , Rfl:  .  omeprazole (PRILOSEC) 40 MG capsule, Take 1 capsule (40 mg total) by mouth daily., Disp: 30 capsule, Rfl: 0 .  warfarin (COUMADIN) 7.5 MG tablet, TAKE 1 AND 1/2 TABLETS BY MOUTH DAILY ORAS DIRECTED BY COUMADIN CLINIC, Disp: 45 tablet, Rfl: 5  Allergies: No Known Allergies  Past Medical History, Surgical history, Social history, and Family History were reviewed and updated.  Review of Systems: As above  Physical Exam:  height is 5\' 10"  (1.778 m) and weight is 228 lb (103.42 kg). His oral temperature is 97.3 F (36.3 C). His blood pressure is 143/73 and his pulse is 70. His respiration is 18.   Well-developed and well-nourished white woman. Head and exam shows no ocular or oral lesions. He has no palpable cervical or supraclavicular lymph nodes. Lungs are clear bilaterally. Cardiac exam regular rate and rhythm with a systolic click consistent with his mechanical valve. . Abdomen is soft. He has good bowel sounds. There is no fluid wave. He has a well-healed laparotomy scar in  the right upper quadrant. There is no palpable liver or spleen tip. Neck exam shows no tenderness over the spine ribs or hips. Extremities shows no clubbing, cyanosis or edema. He has good range of motion of his joints. Skin exam no rashes.   Lab Results  Component Value Date   WBC 7.3 09/24/2014   HGB 12.6* 09/24/2014   HCT 35.4* 09/24/2014   MCV 91 09/24/2014   PLT 168 09/24/2014     Chemistry      Component Value Date/Time   NA 139 09/09/2014 1124   NA 136 03/26/2014 0932   K 4.4 09/09/2014 1124   K 4.4 03/26/2014 0932   CL 102 09/09/2014 1124   CO2 27 09/09/2014 1124   CO2 23 03/26/2014 0932   BUN 32*  09/09/2014 1124   BUN 25.0 03/26/2014 0932   CREATININE 1.24 09/09/2014 1124   CREATININE 1.2 03/26/2014 0932   CREATININE 1.18 05/29/2013 1142      Component Value Date/Time   CALCIUM 9.7 09/09/2014 1124   CALCIUM 9.1 03/26/2014 0932   ALKPHOS 64 03/26/2014 0932   ALKPHOS 66 03/02/2014 1156   AST 41* 03/26/2014 0932   AST 34 03/02/2014 1156   ALT 35 03/26/2014 0932   ALT 34 03/02/2014 1156   BILITOT 0.60 03/26/2014 0932   BILITOT 0.6 03/02/2014 1156         Impression and Plan: [name] is a 24- gentleman with hemochromatosis. He is homozygous for the H63D mutation. He is doing quite well.  He plans to go to [place] for Labor Day. It will be his mother-in-law's 90th birthday.  We will see what his ferritin is. He typically gets phlebotomized once a year or so. I will think that his ferritin should be down..  I think we can probably get him back to be seen in 6 months now.   Volanda Napoleon, MD 8/25/201610:31 AM

## 2014-09-28 ENCOUNTER — Telehealth: Payer: Self-pay | Admitting: Nurse Practitioner

## 2014-09-28 NOTE — Telephone Encounter (Addendum)
LVM on personal machine and instructed patient to contact our office to make a phlebotomy appointment in the next month.   ----- Message from Volanda Napoleon, MD sent at 09/24/2014  2:24 PM EDT ----- Call - tell him that the iron level is 83 and is on the way up!!  Since I am not going to see him for 6 months, i want to do a phlebotomy in the next month!!!  This is being "proactive!!!"  Laurey Arrow

## 2014-10-01 ENCOUNTER — Encounter: Payer: Self-pay | Admitting: Family

## 2014-10-01 ENCOUNTER — Ambulatory Visit (HOSPITAL_BASED_OUTPATIENT_CLINIC_OR_DEPARTMENT_OTHER): Payer: Medicare Other

## 2014-10-01 VITALS — BP 130/68 | HR 69 | Temp 98.0°F | Resp 18

## 2014-10-01 DIAGNOSIS — Z23 Encounter for immunization: Secondary | ICD-10-CM

## 2014-10-01 DIAGNOSIS — C61 Malignant neoplasm of prostate: Secondary | ICD-10-CM

## 2014-10-01 MED ORDER — INFLUENZA VAC SPLIT QUAD 0.5 ML IM SUSY
0.5000 mL | PREFILLED_SYRINGE | Freq: Once | INTRAMUSCULAR | Status: AC
  Administered 2014-10-01: 0.5 mL via INTRAMUSCULAR
  Filled 2014-10-01: qty 0.5

## 2014-10-01 NOTE — Patient Instructions (Addendum)
Influenza Virus Vaccine injection What is this medicine? INFLUENZA VIRUS VACCINE (in floo EN zuh VAHY ruhs vak SEEN) helps to reduce the risk of getting influenza also known as the flu. The vaccine only helps protect you against some strains of the flu. This medicine may be used for other purposes; ask your health care provider or pharmacist if you have questions. COMMON BRAND NAME(S): Afluria, Agriflu, Fluarix, Fluarix Quadrivalent, FLUCELVAX, Flulaval, Fluvirin, Fluzone, Fluzone High-Dose, Fluzone Intradermal What should I tell my health care provider before I take this medicine? They need to know if you have any of these conditions: -bleeding disorder like hemophilia -fever or infection -Guillain-Barre syndrome or other neurological problems -immune system problems -infection with the human immunodeficiency virus (HIV) or AIDS -low blood platelet counts -multiple sclerosis -an unusual or allergic reaction to influenza virus vaccine, latex, other medicines, foods, dyes, or preservatives. Different brands of vaccines contain different allergens. Some may contain latex or eggs. Talk to your doctor about your allergies to make sure that you get the right vaccine. -pregnant or trying to get pregnant -breast-feeding How should I use this medicine? This vaccine is for injection into a muscle or under the skin. It is given by a health care professional. A copy of Vaccine Information Statements will be given before each vaccination. Read this sheet carefully each time. The sheet may change frequently. Talk to your healthcare provider to see which vaccines are right for you. Some vaccines should not be used in all age groups. Overdosage: If you think you have taken too much of this medicine contact a poison control center or emergency room at once. NOTE: This medicine is only for you. Do not share this medicine with others. What if I miss a dose? This does not apply. What may interact with this  medicine? -chemotherapy or radiation therapy -medicines that lower your immune system like etanercept, anakinra, infliximab, and adalimumab -medicines that treat or prevent blood clots like warfarin -phenytoin -steroid medicines like prednisone or cortisone -theophylline -vaccines This list may not describe all possible interactions. Give your health care provider a list of all the medicines, herbs, non-prescription drugs, or dietary supplements you use. Also tell them if you smoke, drink alcohol, or use illegal drugs. Some items may interact with your medicine. What should I watch for while using this medicine? Report any side effects that do not go away within 3 days to your doctor or health care professional. Call your health care provider if any unusual symptoms occur within 6 weeks of receiving this vaccine. You may still catch the flu, but the illness is not usually as bad. You cannot get the flu from the vaccine. The vaccine will not protect against colds or other illnesses that may cause fever. The vaccine is needed every year. What side effects may I notice from receiving this medicine? Side effects that you should report to your doctor or health care professional as soon as possible: -allergic reactions like skin rash, itching or hives, swelling of the face, lips, or tongue Side effects that usually do not require medical attention (report to your doctor or health care professional if they continue or are bothersome): -fever -headache -muscle aches and pains -pain, tenderness, redness, or swelling at the injection site -tiredness This list may not describe all possible side effects. Call your doctor for medical advice about side effects. You may report side effects to FDA at 1-800-FDA-1088. Where should I keep my medicine? The vaccine will be given by a health   care professional in a clinic, pharmacy, doctor's office, or other health care setting. You will not be given vaccine doses  to store at home. NOTE: This sheet is a summary. It may not cover all possible information. If you have questions about this medicine, talk to your doctor, pharmacist, or health care provider.  2015, Elsevier/Gold Standard. (2011-07-27 13:08:28)   Therapeutic Phlebotomy Therapeutic phlebotomy is the controlled removal of blood from your body for the purpose of treating a medical condition. It is similar to donating blood. Usually, about a pint (470 mL) of blood is removed. The average adult has 9 to 12 pints (4.3 to 5.7 L) of blood. Therapeutic phlebotomy may be used to treat the following medical conditions:  Hemochromatosis. This is a condition in which there is too much iron in the blood.  Polycythemia vera. This is a condition in which there are too many red cells in the blood.  Porphyria cutanea tarda. This is a disease usually passed from one generation to the next (inherited). It is a condition in which an important part of hemoglobin is not made properly. This results in the build up of abnormal amounts of porphyrins in the body.  Sickle cell disease. This is an inherited disease. It is a condition in which the red blood cells form an abnormal crescent shape rather than a round shape. LET YOUR CAREGIVER KNOW ABOUT:  Allergies.  Medicines taken including herbs, eyedrops, over-the-counter medicines, and creams.  Use of steroids (by mouth or creams).  Previous problems with anesthetics or numbing medicine.  History of blood clots.  History of bleeding or blood problems.  Previous surgery.  Possibility of pregnancy, if this applies. RISKS AND COMPLICATIONS This is a simple and safe procedure. Problems are unlikely. However, problems can occur and may include:  Nausea or lightheadedness.  Low blood pressure.  Soreness, bleeding, swelling, or bruising at the needle insertion site.  Infection. BEFORE THE PROCEDURE  This is a procedure that can be done as an outpatient.  Confirm the time that you need to arrive for your procedure. Confirm whether there is a need to fast or withhold any medications. It is helpful to wear clothing with sleeves that can be raised above the elbow. A blood sample may be done to determine the amount of red blood cells or iron in your blood. Plan ahead of time to have someone drive you home after the procedure. PROCEDURE The entire procedure from preparation through recovery takes about 1 hour. The actual collection takes about 10 to 15 minutes.  A needle will be inserted into your vein.  Tubing and a collection bag will be attached to that needle.  Blood will flow through the needle and tubing into the collection bag.  You may be asked to open and close your hand slowly and continuously during the entire collection.  Once the specified amount of blood has been removed from your body, the collection bag and tubing will be clamped.  The needle will be removed.  Pressure will be held on the site of the needle insertion to stop the bleeding. Then a bandage will be placed over the needle insertion site. AFTER THE PROCEDURE  Your recovery will be assessed and monitored. If there are no problems, as an outpatient, you should be able to go home shortly after the procedure.  Document Released: 06/20/2010 Document Revised: 04/10/2011 Document Reviewed: 06/20/2010 [employer] Patient Information 2015 [place], [place]. This information is not intended to replace advice given to you by  your health care provider. Make sure you discuss any questions you have with your health care provider.  

## 2014-10-01 NOTE — Progress Notes (Signed)
[name] presents today for phlebotomy per MD orders. Phlebotomy procedure started at 1115 and ended at 1130. 500 grams removed. Patient observed for 30 minutes after procedure without any incident. Patient tolerated procedure well. IV needle removed intact.

## 2014-10-02 MED ORDER — INSULIN GLARGINE 100 UNIT/ML SOLOSTAR PEN: 23.0000 [IU] | PEN_INJECTOR | Freq: Every day | SUBCUTANEOUS | Status: DC

## 2014-10-02 NOTE — Addendum Note (Signed)
Addended by: Kelle Darting A on: 10/02/2014 04:04 PM   Modules accepted: Orders

## 2014-10-08 DIAGNOSIS — C61 Malignant neoplasm of prostate: Secondary | ICD-10-CM | POA: Diagnosis not present

## 2014-10-15 ENCOUNTER — Ambulatory Visit (INDEPENDENT_AMBULATORY_CARE_PROVIDER_SITE_OTHER): Payer: Medicare Other | Admitting: *Deleted

## 2014-10-15 DIAGNOSIS — I359 Nonrheumatic aortic valve disorder, unspecified: Secondary | ICD-10-CM

## 2014-10-15 DIAGNOSIS — Z954 Presence of other heart-valve replacement: Secondary | ICD-10-CM | POA: Diagnosis not present

## 2014-10-15 DIAGNOSIS — Z952 Presence of prosthetic heart valve: Secondary | ICD-10-CM

## 2014-10-15 DIAGNOSIS — Z5181 Encounter for therapeutic drug level monitoring: Secondary | ICD-10-CM | POA: Diagnosis not present

## 2014-10-15 LAB — POCT INR: INR: 2.9

## 2014-10-16 DIAGNOSIS — E291 Testicular hypofunction: Secondary | ICD-10-CM | POA: Diagnosis not present

## 2014-10-16 DIAGNOSIS — C61 Malignant neoplasm of prostate: Secondary | ICD-10-CM | POA: Diagnosis not present

## 2014-10-20 ENCOUNTER — Telehealth: Payer: Self-pay | Admitting: *Deleted

## 2014-10-20 MED ORDER — INSULIN GLARGINE 100 UNIT/ML SOLOSTAR PEN: 23.0000 [IU] | PEN_INJECTOR | Freq: Every day | SUBCUTANEOUS | Status: DC

## 2014-10-20 NOTE — Telephone Encounter (Signed)
Pt walked into the office stating pharmacy did not get our Rx for Lantus previously.  Refills sent and advised pt.

## 2014-10-27 DIAGNOSIS — L570 Actinic keratosis: Secondary | ICD-10-CM | POA: Diagnosis not present

## 2014-10-30 ENCOUNTER — Encounter: Payer: Self-pay | Admitting: Family

## 2014-11-12 ENCOUNTER — Ambulatory Visit (INDEPENDENT_AMBULATORY_CARE_PROVIDER_SITE_OTHER): Payer: Medicare Other | Admitting: *Deleted

## 2014-11-12 DIAGNOSIS — Z5181 Encounter for therapeutic drug level monitoring: Secondary | ICD-10-CM

## 2014-11-12 DIAGNOSIS — Z954 Presence of other heart-valve replacement: Secondary | ICD-10-CM

## 2014-11-12 DIAGNOSIS — Z952 Presence of prosthetic heart valve: Secondary | ICD-10-CM

## 2014-11-12 DIAGNOSIS — I359 Nonrheumatic aortic valve disorder, unspecified: Secondary | ICD-10-CM

## 2014-11-12 LAB — POCT INR: INR: 3.1

## 2014-11-13 ENCOUNTER — Other Ambulatory Visit: Payer: Self-pay | Admitting: Family

## 2014-11-27 ENCOUNTER — Other Ambulatory Visit: Payer: Self-pay | Admitting: Family

## 2014-12-01 ENCOUNTER — Encounter: Payer: Self-pay | Admitting: Family

## 2014-12-05 ENCOUNTER — Other Ambulatory Visit: Payer: Self-pay | Admitting: Cardiology

## 2014-12-10 ENCOUNTER — Other Ambulatory Visit: Payer: Self-pay | Admitting: Medical

## 2014-12-10 ENCOUNTER — Encounter: Payer: Self-pay | Admitting: Medical

## 2014-12-10 ENCOUNTER — Ambulatory Visit (HOSPITAL_BASED_OUTPATIENT_CLINIC_OR_DEPARTMENT_OTHER)
Admission: RE | Admit: 2014-12-10 | Discharge: 2014-12-10 | Disposition: A | Discharge status: Home / Self Care | Payer: Medicare Other | Source: Ambulatory Visit | Attending: Medical | Admitting: Medical

## 2014-12-10 ENCOUNTER — Ambulatory Visit (INDEPENDENT_AMBULATORY_CARE_PROVIDER_SITE_OTHER): Payer: Medicare Other | Admitting: Medical

## 2014-12-10 VITALS — BP 137/73 | HR 80 | Temp 98.6°F | Ht 70.0 in | Wt 231.0 lb

## 2014-12-10 DIAGNOSIS — M25561 Pain in right knee: Secondary | ICD-10-CM

## 2014-12-10 DIAGNOSIS — M1711 Unilateral primary osteoarthritis, right knee: Secondary | ICD-10-CM | POA: Diagnosis not present

## 2014-12-10 DIAGNOSIS — M25461 Effusion, right knee: Secondary | ICD-10-CM | POA: Insufficient documentation

## 2014-12-10 DIAGNOSIS — I2510 Atherosclerotic heart disease of native coronary artery without angina pectoris: Secondary | ICD-10-CM

## 2014-12-10 LAB — CBC WITH DIFFERENTIAL/PLATELET
Basophils Absolute: 0 10*3/uL (ref 0.0–0.1)
Basophils Relative: 0.5 % (ref 0.0–3.0)
EOS PCT: 2.5 % (ref 0.0–5.0)
Eosinophils Absolute: 0.2 10*3/uL (ref 0.0–0.7)
HCT: 37.9 % — ABNORMAL LOW (ref 39.0–52.0)
HEMOGLOBIN: 12.7 g/dL — AB (ref 13.0–17.0)
Lymphocytes Relative: 12.5 % (ref 12.0–46.0)
Lymphs Abs: 1.1 10*3/uL (ref 0.7–4.0)
MCHC: 33.5 g/dL (ref 30.0–36.0)
MCV: 90.9 fl (ref 78.0–100.0)
MONO ABS: 0.6 10*3/uL (ref 0.1–1.0)
MONOS PCT: 6.4 % (ref 3.0–12.0)
Neutro Abs: 7.1 10*3/uL (ref 1.4–7.7)
Neutrophils Relative %: 78.1 % — ABNORMAL HIGH (ref 43.0–77.0)
Platelets: 227 10*3/uL (ref 150.0–400.0)
RBC: 4.17 Mil/uL — ABNORMAL LOW (ref 4.22–5.81)
RDW: 13.5 % (ref 11.5–15.5)
WBC: 9.1 10*3/uL (ref 4.0–10.5)

## 2014-12-10 LAB — URIC ACID: Uric Acid, Serum: 6.7 mg/dL (ref 4.0–7.8)

## 2014-12-10 NOTE — Progress Notes (Signed)
Pre visit review using our clinic review tool, if applicable. No additional management support is needed unless otherwise documented below in the visit note. 

## 2014-12-10 NOTE — Progress Notes (Signed)
Subjective:    Patient ID: [name], male    DOB: 1945/03/16, 69 y.o.   MRN: [id number]  HPI   Pt in with rt knee pain. Pain started about 2 wks ago. Came on slowly. Then he played  Golf 9 days ago and felt more pain. Then played again the following day. For one week has not played golf. Pt has tried resting.  No injury or fall.  Pt has history of gout but he does not think this is gout.   Review of Systems  Constitutional: Negative for fever, chills and fatigue.  Respiratory: Negative for cough, chest tightness and shortness of breath.   Cardiovascular: Negative for chest pain and palpitations.  Musculoskeletal:       Rt knee mild sore feeling and swelling.  Neurological: Negative for dizziness.   Past Medical History  Diagnosis Date  . Allergy   . Hyperlipidemia   . Hypertension   . Cancer [facility]) 2011    prostate  . Obesity     moderate  . Aortic stenosis   . CAD (coronary artery disease)     cabg  . History of gout   . Heart murmur   . History of hepatitis 1974  . Type II or unspecified type diabetes mellitus without mention of complication, not stated as uncontrolled   . Hemorrhoids   . Sensorineural hearing loss   . Hypertrophic obstructive cardiomyopathy   . Erectile dysfunction   . Hemochromatosis 02/14/2011    Social History   Social History  . Marital Status: Married    Spouse Name: N/A  . Number of Children: N/A  . Years of Education: N/A   Occupational History  . Not on file.   Social History Main Topics  . Smoking status: Former Smoker    Types: Cigars  . Smokeless tobacco: Never Used     Comment: Never used Tobacco  . Alcohol Use: 0.0 oz/week    0 Standard drinks or equivalent per week     Comment: 4 glasses wine a month  . Drug Use: Not on file  . Sexual Activity: Not on file   Other Topics Concern  . Not on file   Social History Narrative    Past Surgical History  Procedure Laterality Date  . Prostate seed implant  3/12   . Coronary artery bypass graft  10/2003  . Aortic valve replacement      [facility]  . Hip surgery  2006    right hip  . Cholecystectomy  1990  . Hernia repair  1999    right, inguinal  . Hernia repair  2002    left, inguinal  . Pilonidal cyst excision  1964  . Tonsillectomy  childhood  . Appendectomy  1990    Family History  Problem Relation Age of Onset  . Heart disease Mother   . Stroke Mother   . Heart disease Father   . Coronary artery disease Brother   . Asperger's syndrome Son   . Hyperlipidemia Son   . Cancer Neg Hx     negative for colon cancer    No Known Allergies  Current Outpatient Prescriptions on File Prior to Visit  Medication Sig Dispense Refill  . atorvastatin (LIPITOR) 20 MG tablet TAKE ONE (1) TABLET EACH DAY 90 tablet 1  . glucose blood (FREESTYLE LITE) test strip CHECK BLOOD SUGAR ONCE DAILY 100 each 1  . Insulin Glargine (LANTUS SOLOSTAR) 100 UNIT/ML Solostar Pen Inject 23 Units  into the skin daily at 10 pm. 5 pen 5  . Insulin Pen Needle (PEN NEEDLES) 31G X 6 MM MISC Use as directed 100 each 3  . Lancets (FREESTYLE) lancets Use as directed to check blood sugar twice a day. 100 each 1  . metFORMIN (GLUCOPHAGE) 500 MG tablet TAKE TWO (2) TABLETS BY MOUTH 2 TIMES DAILY 120 tablet 6  . metoprolol succinate (TOPROL-XL) 25 MG 24 hr tablet TAKE 1/2 TABLET BY MOUTH DAILY 45 tablet 1  . olmesartan-hydrochlorothiazide (BENICAR HCT) 20-12.5 MG per tablet Take 0.5 tablets by mouth daily. 45 tablet 1  . Omega-3 Fatty Acids (FISH OIL) 1000 MG CAPS Take 1 capsule by mouth daily.     Marland Kitchen warfarin (COUMADIN) 7.5 MG tablet TAKE 1 AND 1/2 TABLETS BY MOUTH DAILY ORAS DIRECTED BY COUMADIN CLINIC 45 tablet 3  . colchicine 0.6 MG tablet 2 tabs by mouth at start or gout flare, then 1 tab 1 hour later.  May repeat following day if no improvement. 9 tablet 5   No current facility-administered medications on file prior to visit.    BP 137/73 mmHg  Pulse 80  Temp(Src)  98.6 F (37 C) (Oral)  Ht 5\' 10"  (1.778 m)  Wt 231 lb (104.781 kg)  BMI 33.15 kg/m2  SpO2 97%       Objective:   Physical Exam  General- No acute distress. Pleasant patient. Neck- Full range of motion, no jvd Lungs- Clear, even and unlabored. Heart- regular rate and rhythm. Neurologic- CNII- XII grossly intact.  Rt knee- moderate swollen. No instability. More swelling in tibial plateau area/lateral region. Negative homans homans sign.Calf not swollen/symmetric compared to left.      Assessment & Plan:  With your knee pain and swelling will get xray, cbc and cmp.  You don't appear to be in too much pain so you could take tylenol for pain.  Will follow studies and may refer you to sports medicine or orthopedist dependning on lab results and how you are.  We should call you tomorrow.  If you at any point get pain behind the knee let us know and would get lower ext doppler

## 2014-12-10 NOTE — Patient Instructions (Addendum)
With your knee pain and swelling will get xray, cbc and cmp.  You don't appear to be in too much pain so you could take tylenol for pain.  Will follow studies and may refer you to sports medicine or orthopedist dependning on lab results and how you are.  We should call you tomorrow.  If you at any point get pain behind the knee let us know and would get lower ext doppler

## 2014-12-11 ENCOUNTER — Ambulatory Visit (INDEPENDENT_AMBULATORY_CARE_PROVIDER_SITE_OTHER): Payer: Medicare Other | Admitting: *Deleted

## 2014-12-11 DIAGNOSIS — I359 Nonrheumatic aortic valve disorder, unspecified: Secondary | ICD-10-CM

## 2014-12-11 DIAGNOSIS — Z952 Presence of prosthetic heart valve: Secondary | ICD-10-CM

## 2014-12-11 DIAGNOSIS — Z5181 Encounter for therapeutic drug level monitoring: Secondary | ICD-10-CM | POA: Diagnosis not present

## 2014-12-11 DIAGNOSIS — Z954 Presence of other heart-valve replacement: Secondary | ICD-10-CM | POA: Diagnosis not present

## 2014-12-11 LAB — POCT INR: INR: 2.4

## 2014-12-16 ENCOUNTER — Telehealth: Payer: Self-pay | Admitting: Family

## 2014-12-16 DIAGNOSIS — M25569 Pain in unspecified knee: Secondary | ICD-10-CM

## 2014-12-16 NOTE — Telephone Encounter (Signed)
Caller name:Self   Can be reached: [phone]    Reason for call: Patient saw Percell Miller last week and was told to call back if he still had problems for a referral. States that his knee is still swollen and the bumps are still there.

## 2014-12-17 ENCOUNTER — Encounter: Payer: Self-pay | Admitting: Family Medicine

## 2014-12-17 ENCOUNTER — Ambulatory Visit (INDEPENDENT_AMBULATORY_CARE_PROVIDER_SITE_OTHER): Payer: Medicare Other | Admitting: Family Medicine

## 2014-12-17 VITALS — BP 160/80 | HR 82 | Ht 70.0 in | Wt 230.0 lb

## 2014-12-17 DIAGNOSIS — M25561 Pain in right knee: Secondary | ICD-10-CM | POA: Diagnosis not present

## 2014-12-17 DIAGNOSIS — I2510 Atherosclerotic heart disease of native coronary artery without angina pectoris: Secondary | ICD-10-CM | POA: Diagnosis not present

## 2014-12-17 MED ORDER — METHYLPREDNISOLONE ACETATE 40 MG/ML IJ SUSP
40.0000 mg | Freq: Once | INTRAMUSCULAR | Status: AC
  Administered 2014-12-17: 40 mg via INTRA_ARTICULAR

## 2014-12-17 NOTE — Patient Instructions (Signed)
Your knee pain is mainly due to the severe swelling, consistent with arthritis. These are the 4 classes of medicine you can use for this: Tylenol 500mg  1-2 tabs three times a day for pain. Aleve 1-2 tabs twice a day with food Glucosamine sulfate 750mg  twice a day is a supplement that may help. Capsaicin, aspercreme, or biofreeze topically up to four times a day may also help with pain. Cortisone injections are an option - you were given this today. If cortisone injections do not help, there are different types of shots that may help but they take longer to take effect. It's important that you continue to stay active. Straight leg raises, knee extensions 3 sets of 10 once a day (add ankle weight if these become too easy). Consider physical therapy to strengthen muscles around the joint that hurts to take pressure off of the joint itself. Shoe inserts with good arch support may be helpful. Heat or ice 15 minutes at a time 3-4 times a day as needed to help with pain. Water aerobics and cycling with low resistance are the best two types of exercise for arthritis. No restrictions on activities though. Follow up with me as needed.

## 2014-12-21 DIAGNOSIS — M25561 Pain in right knee: Secondary | ICD-10-CM | POA: Insufficient documentation

## 2014-12-21 NOTE — Assessment & Plan Note (Signed)
consistent with DJD with an effusion.  Aspiration and injection performed today.  Discussed tylenol, nsaids, glucosamine, topical medications.  Shown home exercises to do daily.  Ice if needed.  F/u prn.  After informed written consent patient was lying supine on exam table.  Right knee was prepped with alcohol swab.  Utilizing superolateral approach, 3 mL of marcaine was used for local anesthesia.  Then using an 18g needle on 60cc syringe, 45 mL of clear straw-colored fluid was aspirated from right knee.  Knee was then injected with 3:1 marcaine:depomedrol.  Patient tolerated procedure well without immediate complications

## 2014-12-21 NOTE — Progress Notes (Signed)
PCP and consultation requested by: Nance Pear., NP  Subjective:   HPI: Patient is a 69 y.o. male here for right knee pain.  Patient reports he's had 2 weeks of anterior right knee pain. Associated with swelling. Pain is aching.  Currently 0/10. Played a hilly course of golf recently. Using an ACE wrap No skin changes, fever, other complaints.  Past Medical History  Diagnosis Date  . Allergy   . Hyperlipidemia   . Hypertension   . Cancer [facility]) 2011    prostate  . Obesity     moderate  . Aortic stenosis   . CAD (coronary artery disease)     cabg  . History of gout   . Heart murmur   . History of hepatitis 1974  . Type II or unspecified type diabetes mellitus without mention of complication, not stated as uncontrolled   . Hemorrhoids   . Sensorineural hearing loss   . Hypertrophic obstructive cardiomyopathy   . Erectile dysfunction   . Hemochromatosis 02/14/2011    Current Outpatient Prescriptions on File Prior to Visit  Medication Sig Dispense Refill  . atorvastatin (LIPITOR) 20 MG tablet TAKE ONE (1) TABLET EACH DAY 90 tablet 1  . colchicine 0.6 MG tablet 2 tabs by mouth at start or gout flare, then 1 tab 1 hour later.  May repeat following day if no improvement. 9 tablet 5  . glucose blood (FREESTYLE LITE) test strip CHECK BLOOD SUGAR ONCE DAILY 100 each 1  . Insulin Glargine (LANTUS SOLOSTAR) 100 UNIT/ML Solostar Pen Inject 23 Units into the skin daily at 10 pm. 5 pen 5  . Insulin Pen Needle (PEN NEEDLES) 31G X 6 MM MISC Use as directed 100 each 3  . Lancets (FREESTYLE) lancets Use as directed to check blood sugar twice a day. 100 each 1  . metFORMIN (GLUCOPHAGE) 500 MG tablet TAKE TWO (2) TABLETS BY MOUTH 2 TIMES DAILY 120 tablet 6  . metoprolol succinate (TOPROL-XL) 25 MG 24 hr tablet TAKE 1/2 TABLET BY MOUTH DAILY 45 tablet 1  . olmesartan-hydrochlorothiazide (BENICAR HCT) 20-12.5 MG per tablet Take 0.5 tablets by mouth daily. 45 tablet 1  . Omega-3 Fatty  Acids (FISH OIL) 1000 MG CAPS Take 1 capsule by mouth daily.     Marland Kitchen warfarin (COUMADIN) 7.5 MG tablet TAKE 1 AND 1/2 TABLETS BY MOUTH DAILY ORAS DIRECTED BY COUMADIN CLINIC 45 tablet 3   No current facility-administered medications on file prior to visit.    Past Surgical History  Procedure Laterality Date  . Prostate seed implant  3/12  . Coronary artery bypass graft  10/2003  . Aortic valve replacement      [facility]  . Hip surgery  2006    right hip  . Cholecystectomy  1990  . Hernia repair  1999    right, inguinal  . Hernia repair  2002    left, inguinal  . Pilonidal cyst excision  1964  . Tonsillectomy  childhood  . Appendectomy  1990    No Known Allergies  Social History   Social History  . Marital Status: Married    Spouse Name: N/A  . Number of Children: N/A  . Years of Education: N/A   Occupational History  . Not on file.   Social History Main Topics  . Smoking status: Former Smoker    Types: Cigars  . Smokeless tobacco: Never Used     Comment: Never used Tobacco  . Alcohol Use: 0.0 oz/week  0 Standard drinks or equivalent per week     Comment: 4 glasses wine a month  . Drug Use: Not on file  . Sexual Activity: Not on file   Other Topics Concern  . Not on file   Social History Narrative    Family History  Problem Relation Age of Onset  . Heart disease Mother   . Stroke Mother   . Heart disease Father   . Coronary artery disease Brother   . Asperger's syndrome Son   . Hyperlipidemia Son   . Cancer Neg Hx     negative for colon cancer    BP 160/80 mmHg  Pulse 82  Ht 5\' 10"  (1.778 m)  Wt 230 lb (104.327 kg)  BMI 33.00 kg/m2  Review of Systems: See HPI above.    Objective:  Physical Exam:  Gen: NAD  Right knee: Mod effusion.  No bruising, other deformity. No TTP. ROM 0 - 110 degrees. Negative ant/post drawers. Negative valgus/varus testing. Negative lachmanns. Negative mcmurrays, apleys, patellar apprehension. NV intact  distally.  Left knee: FROM without pain.    Assessment & Plan:  1. Right knee pain - consistent with DJD with an effusion.  Aspiration and injection performed today.  Discussed tylenol, nsaids, glucosamine, topical medications.  Shown home exercises to do daily.  Ice if needed.  F/u prn.  After informed written consent patient was lying supine on exam table.  Right knee was prepped with alcohol swab.  Utilizing superolateral approach, 3 mL of marcaine was used for local anesthesia.  Then using an 18g needle on 60cc syringe, 45 mL of clear straw-colored fluid was aspirated from right knee.  Knee was then injected with 3:1 marcaine:depomedrol.  Patient tolerated procedure well without immediate complications

## 2014-12-30 ENCOUNTER — Encounter: Payer: Self-pay | Admitting: Family

## 2015-01-01 ENCOUNTER — Ambulatory Visit (INDEPENDENT_AMBULATORY_CARE_PROVIDER_SITE_OTHER): Payer: Medicare Other | Admitting: Pharmacist

## 2015-01-01 DIAGNOSIS — I359 Nonrheumatic aortic valve disorder, unspecified: Secondary | ICD-10-CM

## 2015-01-01 DIAGNOSIS — Z954 Presence of other heart-valve replacement: Secondary | ICD-10-CM | POA: Diagnosis not present

## 2015-01-01 DIAGNOSIS — Z952 Presence of prosthetic heart valve: Secondary | ICD-10-CM

## 2015-01-01 DIAGNOSIS — Z5181 Encounter for therapeutic drug level monitoring: Secondary | ICD-10-CM

## 2015-01-01 LAB — POCT INR: INR: 2.3

## 2015-01-04 ENCOUNTER — Ambulatory Visit: Payer: Medicare Other | Admitting: Family

## 2015-01-07 ENCOUNTER — Encounter: Payer: Self-pay | Admitting: Family Medicine

## 2015-01-08 ENCOUNTER — Encounter: Payer: Self-pay | Admitting: Family

## 2015-01-08 ENCOUNTER — Ambulatory Visit (INDEPENDENT_AMBULATORY_CARE_PROVIDER_SITE_OTHER): Payer: Medicare Other | Admitting: Family

## 2015-01-08 VITALS — BP 135/65 | HR 69 | Temp 98.1°F | Resp 16 | Ht 70.0 in | Wt 227.6 lb

## 2015-01-08 DIAGNOSIS — R809 Proteinuria, unspecified: Secondary | ICD-10-CM | POA: Diagnosis not present

## 2015-01-08 DIAGNOSIS — E1165 Type 2 diabetes mellitus with hyperglycemia: Secondary | ICD-10-CM

## 2015-01-08 DIAGNOSIS — IMO0002 Reserved for concepts with insufficient information to code with codable children: Secondary | ICD-10-CM

## 2015-01-08 DIAGNOSIS — Z87448 Personal history of other diseases of urinary system: Secondary | ICD-10-CM

## 2015-01-08 DIAGNOSIS — I2510 Atherosclerotic heart disease of native coronary artery without angina pectoris: Secondary | ICD-10-CM

## 2015-01-08 DIAGNOSIS — Z794 Long term (current) use of insulin: Secondary | ICD-10-CM

## 2015-01-08 DIAGNOSIS — I10 Essential (primary) hypertension: Secondary | ICD-10-CM

## 2015-01-08 DIAGNOSIS — E785 Hyperlipidemia, unspecified: Secondary | ICD-10-CM | POA: Diagnosis not present

## 2015-01-08 DIAGNOSIS — E781 Pure hyperglyceridemia: Secondary | ICD-10-CM

## 2015-01-08 DIAGNOSIS — E1129 Type 2 diabetes mellitus with other diabetic kidney complication: Secondary | ICD-10-CM

## 2015-01-08 DIAGNOSIS — R310 Gross hematuria: Secondary | ICD-10-CM

## 2015-01-08 DIAGNOSIS — E118 Type 2 diabetes mellitus with unspecified complications: Secondary | ICD-10-CM

## 2015-01-08 LAB — URINALYSIS, ROUTINE W REFLEX MICROSCOPIC
BILIRUBIN URINE: NEGATIVE
HGB URINE DIPSTICK: NEGATIVE
Ketones, ur: NEGATIVE
LEUKOCYTES UA: NEGATIVE
NITRITE: NEGATIVE
RBC / HPF: NONE SEEN (ref 0–?)
Specific Gravity, Urine: >=1.03 — AB (ref 1.000–1.030)
TOTAL PROTEIN, URINE-UPE24: NEGATIVE
URINE GLUCOSE: NEGATIVE
Urobilinogen, UA: 0.2 (ref 0.0–1.0)
WBC, UA: NONE SEEN (ref 0–?)
pH: 5.5 (ref 5.0–8.0)

## 2015-01-08 LAB — BASIC METABOLIC PANEL
BUN: 19 mg/dL (ref 6–23)
CALCIUM: 9.4 mg/dL (ref 8.4–10.5)
CO2: 28 meq/L (ref 19–32)
CREATININE: 1.08 mg/dL (ref 0.40–1.50)
Chloride: 101 mEq/L (ref 96–112)
GFR: 71.89 mL/min (ref >60.00)
GLUCOSE: 148 mg/dL — AB (ref 70–99)
Potassium: 4.5 mEq/L (ref 3.5–5.1)
Sodium: 137 mEq/L (ref 135–145)

## 2015-01-08 LAB — LIPID PANEL
Cholesterol: 118 mg/dL (ref 0–200)
HDL: 29 mg/dL — ABNORMAL LOW (ref >39.00)
NONHDL: 88.9
Total CHOL/HDL Ratio: 4
Triglycerides: 388 mg/dL — ABNORMAL HIGH (ref 0.0–149.0)
VLDL: 77.6 mg/dL — ABNORMAL HIGH (ref 0.0–40.0)

## 2015-01-08 LAB — HEMOGLOBIN A1C: Hgb A1c MFr Bld: 7.3 % — ABNORMAL HIGH (ref 4.6–6.5)

## 2015-01-08 LAB — LDL CHOLESTEROL, DIRECT: Direct LDL: 45 mg/dL

## 2015-01-08 NOTE — Assessment & Plan Note (Signed)
Stable, obtain bmet and A1C.

## 2015-01-08 NOTE — Assessment & Plan Note (Signed)
Due to previous hx of hematuria, will obtain UA to exclude microscopic hematuria.

## 2015-01-08 NOTE — Assessment & Plan Note (Signed)
Repeat lipid panel ?

## 2015-01-08 NOTE — Patient Instructions (Signed)
Please complete lab work prior to leaving.   

## 2015-01-08 NOTE — Progress Notes (Signed)
Pre visit review using our clinic review tool, if applicable. No additional management support is needed unless otherwise documented below in the visit note. 

## 2015-01-08 NOTE — Assessment & Plan Note (Signed)
Stable on current meds.  Continue same. 

## 2015-01-08 NOTE — Progress Notes (Signed)
Subjective:    Patient ID: [name], male    DOB: [birth year], 69 y.o.   MRN: [id number]  HPI   [name] is a 69 yr old male who presents today for follow up.  1) DM2- current meds include Lantus 23 units nightly and metformin. Reports sugar 131 this AM, generally 120's Lab Results  Component Value Date   HGBA1C 7.2* 09/09/2014   HGBA1C 7.8* 06/02/2014   HGBA1C 7.7* 03/02/2014   Lab Results  Component Value Date   MICROALBUR 8.7* 09/09/2014   LDLCALC 54 11/25/2010   CREATININE 1.2 09/24/2014   2) HTN- currently maintained on toprol xl and benicar HCT BP Readings from Last 3 Encounters:  01/08/15 135/65  12/17/14 160/80  12/10/14 137/73   3) Hyperlipidemia- maintained on lipitor 20mg .   Lab Results  Component Value Date   CHOL 135 09/09/2014   HDL 31.70* 09/09/2014   LDLCALC 54 11/25/2010   LDLDIRECT 61.0 09/09/2014   TRIG 297.0* 09/09/2014   CHOLHDL 4 09/09/2014       Review of Systems    see HPI  Past Medical History  Diagnosis Date  . Allergy   . Hyperlipidemia   . Hypertension   . Cancer [facility]) 2011    prostate  . Obesity     moderate  . Aortic stenosis   . CAD (coronary artery disease)     cabg  . History of gout   . Heart murmur   . History of hepatitis 1974  . Type II or unspecified type diabetes mellitus without mention of complication, not stated as uncontrolled   . Hemorrhoids   . Sensorineural hearing loss   . Hypertrophic obstructive cardiomyopathy   . Erectile dysfunction   . Hemochromatosis 02/14/2011    Social History   Social History  . Marital Status: Married    Spouse Name: N/A  . Number of Children: N/A  . Years of Education: N/A   Occupational History  . Not on file.   Social History Main Topics  . Smoking status: Former Smoker    Types: Cigars  . Smokeless tobacco: Never Used     Comment: Never used Tobacco  . Alcohol Use: 0.0 oz/week    0 Standard drinks or equivalent per week     Comment: 4 glasses  wine a month  . Drug Use: Not on file  . Sexual Activity: Not on file   Other Topics Concern  . Not on file   Social History Narrative    Past Surgical History  Procedure Laterality Date  . Prostate seed implant  3/12  . Coronary artery bypass graft  10/2003  . Aortic valve replacement      [facility]  . Hip surgery  2006    right hip  . Cholecystectomy  1990  . Hernia repair  1999    right, inguinal  . Hernia repair  2002    left, inguinal  . Pilonidal cyst excision  1964  . Tonsillectomy  childhood  . Appendectomy  1990    Family History  Problem Relation Age of Onset  . Heart disease Mother   . Stroke Mother   . Heart disease Father   . Coronary artery disease Brother   . Asperger's syndrome Son   . Hyperlipidemia Son   . Cancer Neg Hx     negative for colon cancer    No Known Allergies  Current Outpatient Prescriptions on File Prior to Visit  Medication Sig  Dispense Refill  . atorvastatin (LIPITOR) 20 MG tablet TAKE ONE (1) TABLET EACH DAY 90 tablet 1  . colchicine 0.6 MG tablet 2 tabs by mouth at start or gout flare, then 1 tab 1 hour later.  May repeat following day if no improvement. 9 tablet 5  . glucose blood (FREESTYLE LITE) test strip CHECK BLOOD SUGAR ONCE DAILY 100 each 1  . Insulin Glargine (LANTUS SOLOSTAR) 100 UNIT/ML Solostar Pen Inject 23 Units into the skin daily at 10 pm. 5 pen 5  . Insulin Pen Needle (PEN NEEDLES) 31G X 6 MM MISC Use as directed 100 each 3  . Lancets (FREESTYLE) lancets Use as directed to check blood sugar twice a day. 100 each 1  . metFORMIN (GLUCOPHAGE) 500 MG tablet TAKE TWO (2) TABLETS BY MOUTH 2 TIMES DAILY 120 tablet 6  . metoprolol succinate (TOPROL-XL) 25 MG 24 hr tablet TAKE 1/2 TABLET BY MOUTH DAILY 45 tablet 1  . olmesartan-hydrochlorothiazide (BENICAR HCT) 20-12.5 MG per tablet Take 0.5 tablets by mouth daily. 45 tablet 1  . Omega-3 Fatty Acids (FISH OIL) 1000 MG CAPS Take 1 capsule by mouth daily.     Marland Kitchen  warfarin (COUMADIN) 7.5 MG tablet TAKE 1 AND 1/2 TABLETS BY MOUTH DAILY ORAS DIRECTED BY COUMADIN CLINIC 45 tablet 3   No current facility-administered medications on file prior to visit.    BP 135/65 mmHg  Pulse 69  Temp(Src) 98.1 F (36.7 C) (Oral)  Resp 16  Ht 5\' 10"  (1.778 m)  Wt 227 lb 9.6 oz (103.239 kg)  BMI 32.66 kg/m2  SpO2 99%    Objective:   Physical Exam  Constitutional: He is oriented to person, place, and time. He appears well-developed and well-nourished. No distress.  HENT:  Head: Normocephalic and atraumatic.  Cardiovascular: Normal rate and regular rhythm.   No murmur heard. + mechanical valve click  Pulmonary/Chest: Effort normal and breath sounds normal. No respiratory distress. He has no wheezes. He has no rales.  Musculoskeletal:  2+ pedal edema bilaterally  Neurological: He is alert and oriented to person, place, and time.  Skin: Skin is warm and dry.  Psychiatric: He has a normal mood and affect. His behavior is normal. Thought content normal.          Assessment & Plan:

## 2015-01-12 MED ORDER — INSULIN GLARGINE 100 UNIT/ML SOLOSTAR PEN: 26.0000 [IU] | PEN_INJECTOR | Freq: Every day | SUBCUTANEOUS | Status: DC

## 2015-01-12 MED ORDER — FENOFIBRATE 145 MG PO TABS: 145.0000 mg | ORAL_TABLET | Freq: Every day | ORAL | Status: DC

## 2015-01-14 ENCOUNTER — Encounter: Payer: Self-pay | Admitting: Family Medicine

## 2015-01-18 ENCOUNTER — Other Ambulatory Visit: Payer: Self-pay | Admitting: Family

## 2015-01-27 ENCOUNTER — Other Ambulatory Visit: Payer: Self-pay | Admitting: Family

## 2015-01-27 NOTE — Telephone Encounter (Signed)
Rx sent to the pharmacy by e-script.//AB/CMA 

## 2015-01-29 ENCOUNTER — Ambulatory Visit (INDEPENDENT_AMBULATORY_CARE_PROVIDER_SITE_OTHER): Payer: Medicare Other | Admitting: Pharmacist

## 2015-01-29 DIAGNOSIS — Z952 Presence of prosthetic heart valve: Secondary | ICD-10-CM

## 2015-01-29 DIAGNOSIS — Z954 Presence of other heart-valve replacement: Secondary | ICD-10-CM

## 2015-01-29 DIAGNOSIS — I359 Nonrheumatic aortic valve disorder, unspecified: Secondary | ICD-10-CM

## 2015-01-29 LAB — POCT INR: INR: 2.5

## 2015-01-30 ENCOUNTER — Encounter: Payer: Self-pay | Admitting: Family

## 2015-02-02 ENCOUNTER — Other Ambulatory Visit: Payer: Self-pay | Admitting: Family

## 2015-02-16 DIAGNOSIS — L570 Actinic keratosis: Secondary | ICD-10-CM | POA: Diagnosis not present

## 2015-03-02 ENCOUNTER — Encounter: Payer: Self-pay | Admitting: Family

## 2015-03-05 ENCOUNTER — Ambulatory Visit (INDEPENDENT_AMBULATORY_CARE_PROVIDER_SITE_OTHER): Payer: Medicare Other | Admitting: *Deleted

## 2015-03-05 DIAGNOSIS — Z954 Presence of other heart-valve replacement: Secondary | ICD-10-CM

## 2015-03-05 LAB — POCT INR: INR: 2.2

## 2015-03-15 ENCOUNTER — Encounter: Payer: Self-pay | Admitting: Family

## 2015-03-15 ENCOUNTER — Telehealth: Payer: Self-pay | Admitting: Family

## 2015-03-15 ENCOUNTER — Ambulatory Visit (INDEPENDENT_AMBULATORY_CARE_PROVIDER_SITE_OTHER): Payer: Medicare Other | Admitting: Family

## 2015-03-15 ENCOUNTER — Encounter: Payer: Self-pay | Admitting: Family Medicine

## 2015-03-15 VITALS — BP 116/66 | HR 70 | Temp 98.2°F | Resp 16 | Wt 229.0 lb

## 2015-03-15 DIAGNOSIS — M7021 Olecranon bursitis, right elbow: Secondary | ICD-10-CM | POA: Diagnosis not present

## 2015-03-15 DIAGNOSIS — M2540 Effusion, unspecified joint: Secondary | ICD-10-CM

## 2015-03-15 LAB — CBC WITH DIFFERENTIAL/PLATELET
BASOS ABS: 0 10*3/uL (ref 0.0–0.1)
Basophils Relative: 0.4 % (ref 0.0–3.0)
EOS ABS: 0.3 10*3/uL (ref 0.0–0.7)
Eosinophils Relative: 2.9 % (ref 0.0–5.0)
HCT: 38.7 % — ABNORMAL LOW (ref 39.0–52.0)
HEMOGLOBIN: 12.9 g/dL — AB (ref 13.0–17.0)
LYMPHS ABS: 1.4 10*3/uL (ref 0.7–4.0)
Lymphocytes Relative: 15.9 % (ref 12.0–46.0)
MCHC: 33.3 g/dL (ref 30.0–36.0)
MCV: 91 fl (ref 78.0–100.0)
MONO ABS: 0.6 10*3/uL (ref 0.1–1.0)
Monocytes Relative: 6.3 % (ref 3.0–12.0)
NEUTROS PCT: 74.5 % (ref 43.0–77.0)
Neutro Abs: 6.7 10*3/uL (ref 1.4–7.7)
Platelets: 196 10*3/uL (ref 150.0–400.0)
RBC: 4.26 Mil/uL (ref 4.22–5.81)
RDW: 14.1 % (ref 11.5–15.5)
WBC: 9 10*3/uL (ref 4.0–10.5)

## 2015-03-15 LAB — URIC ACID: URIC ACID, SERUM: 9.4 mg/dL — AB (ref 4.0–7.8)

## 2015-03-15 MED ORDER — COLCHICINE 0.6 MG PO TABS: ORAL_TABLET | ORAL | Status: DC

## 2015-03-15 MED ORDER — CEPHALEXIN 500 MG PO CAPS: 500.0000 mg | ORAL_CAPSULE | Freq: Three times a day (TID) | ORAL | Status: DC

## 2015-03-15 MED ORDER — ALLOPURINOL 100 MG PO TABS: 100.0000 mg | ORAL_TABLET | Freq: Every day | ORAL | Status: DC

## 2015-03-15 NOTE — Telephone Encounter (Signed)
Please advise patient that I reviewed his lab work and uric acid (gout test) is high. I would like him to take colchicine (2 tabs now, 1 tab in 1 hour) and start allopurinol 100mg  once daily.  Do not take statin medicine tonight due to a potential drug interaction with the colchicine. I still would like him to follow up with Dr. Barbaraann Barthel and our office will arrange.

## 2015-03-15 NOTE — Progress Notes (Signed)
Pre visit review using our clinic review tool, if applicable. No additional management support is needed unless otherwise documented below in the visit note. 

## 2015-03-15 NOTE — Patient Instructions (Addendum)
Please complete lab work prior to leaving. Follow up in March as scheduled You will be contacted about your referral to Dr. Barbaraann Barthel.

## 2015-03-15 NOTE — Telephone Encounter (Signed)
Notified pt. He voices understanding and is agreeable to referral.

## 2015-03-15 NOTE — Progress Notes (Signed)
Subjective:    Patient ID: [name], male    DOB: Jan 23, 1946, 71 y.o.   MRN: [id number]  HPI  Mr. Korff is a 70 yr old male who presents today with chief complaint of right wrist swelling/redness.  Symptoms have been present x 3 weeks.  Happened out of the blue.  No injury. Denies fever.  Painful "if I hit it."    Review of Systems See  HPI  Past Medical History  Diagnosis Date  . Allergy   . Hyperlipidemia   . Hypertension   . Cancer [facility]) 2011    prostate  . Obesity     moderate  . Aortic stenosis   . CAD (coronary artery disease)     cabg  . History of gout   . Heart murmur   . History of hepatitis 1974  . Type II or unspecified type diabetes mellitus without mention of complication, not stated as uncontrolled   . Hemorrhoids   . Sensorineural hearing loss   . Hypertrophic obstructive cardiomyopathy   . Erectile dysfunction   . Hemochromatosis 02/14/2011    Social History   Social History  . Marital Status: Married    Spouse Name: N/A  . Number of Children: N/A  . Years of Education: N/A   Occupational History  . Not on file.   Social History Main Topics  . Smoking status: Former Smoker    Types: Cigars  . Smokeless tobacco: Never Used     Comment: Never used Tobacco  . Alcohol Use: 0.0 oz/week    0 Standard drinks or equivalent per week     Comment: 4 glasses wine a month  . Drug Use: Not on file  . Sexual Activity: Not on file   Other Topics Concern  . Not on file   Social History Narrative    Past Surgical History  Procedure Laterality Date  . Prostate seed implant  3/12  . Coronary artery bypass graft  10/2003  . Aortic valve replacement      [facility]  . Hip surgery  2006    right hip  . Cholecystectomy  1990  . Hernia repair  1999    right, inguinal  . Hernia repair  2002    left, inguinal  . Pilonidal cyst excision  1964  . Tonsillectomy  childhood  . Appendectomy  1990    Family History  Problem Relation Age of  Onset  . Heart disease Mother   . Stroke Mother   . Heart disease Father   . Coronary artery disease Brother   . Asperger's syndrome Son   . Hyperlipidemia Son   . Cancer Neg Hx     negative for colon cancer    No Known Allergies  Current Outpatient Prescriptions on File Prior to Visit  Medication Sig Dispense Refill  . atorvastatin (LIPITOR) 20 MG tablet TAKE ONE (1) TABLET BY MOUTH EVERY DAY 90 tablet 1  . BENICAR HCT 20-12.5 MG tablet TAKE 1/2 TABLET EVERY DAY 45 tablet 1  . FREESTYLE LITE test strip CHECK BLOOD SUGAR ONCE DAILY 100 each 1  . Insulin Glargine (LANTUS SOLOSTAR) 100 UNIT/ML Solostar Pen Inject 26 Units into the skin daily at 10 pm. 5 pen 5  . Insulin Pen Needle (PEN NEEDLES) 31G X 6 MM MISC Use as directed 100 each 3  . Lancets (FREESTYLE) lancets Use as directed to check blood sugar twice a day. 100 each 1  . metFORMIN (GLUCOPHAGE) 500  MG tablet TAKE TWO (2) TABLETS BY MOUTH 2 TIMES DAILY 120 tablet 6  . metoprolol succinate (TOPROL-XL) 25 MG 24 hr tablet TAKE 1/2 TABLET BY MOUTH DAILY 45 tablet 1  . Omega-3 Fatty Acids (FISH OIL) 1000 MG CAPS Take 1 capsule by mouth daily.     Marland Kitchen warfarin (COUMADIN) 7.5 MG tablet TAKE 1 AND 1/2 TABLETS BY MOUTH DAILY ORAS DIRECTED BY COUMADIN CLINIC 45 tablet 3  . colchicine 0.6 MG tablet 2 tabs by mouth at start or gout flare, then 1 tab 1 hour later.  May repeat following day if no improvement. (Patient not taking: Reported on 03/15/2015) 9 tablet 5  . fenofibrate (TRICOR) 145 MG tablet Take 1 tablet (145 mg total) by mouth daily. (Patient not taking: Reported on 03/15/2015) 30 tablet 5   No current facility-administered medications on file prior to visit.    BP 116/66 mmHg  Pulse 70  Temp(Src) 98.2 F (36.8 C) (Oral)  Resp 16  Wt 229 lb (103.874 kg)  SpO2 98%       Objective:   Physical Exam  Constitutional: He is oriented to person, place, and time. He appears well-developed and well-nourished. No distress.    Musculoskeletal: He exhibits no edema.  R olecranon bursa is swollen, soft, mildly tender warm to touch and erythematous.  Neurological: He is alert and oriented to person, place, and time.  Psychiatric: He has a normal mood and affect. His behavior is normal. Judgment and thought content normal.          Assessment & Plan:  R olecranon bursitis- New. Rx sent for empiric keflex.  Uric acid level is elevated. Rx with colchicine, add allopurinol.  (see phone note 03/15/15). Refer to sports medicine.

## 2015-03-16 NOTE — Telephone Encounter (Signed)
Ms. [name]- just an FYI, I am starting [name] on allopurinol and wanted you to be aware since you are managing his coumadin.

## 2015-03-17 ENCOUNTER — Encounter: Payer: Self-pay | Admitting: Family Medicine

## 2015-03-17 ENCOUNTER — Ambulatory Visit (INDEPENDENT_AMBULATORY_CARE_PROVIDER_SITE_OTHER): Payer: Medicare Other | Admitting: Family Medicine

## 2015-03-17 ENCOUNTER — Ambulatory Visit: Payer: Medicare Other | Admitting: Family Medicine

## 2015-03-17 VITALS — BP 132/77 | HR 77 | Ht 70.0 in | Wt 229.0 lb

## 2015-03-17 DIAGNOSIS — M7021 Olecranon bursitis, right elbow: Secondary | ICD-10-CM | POA: Diagnosis not present

## 2015-03-17 DIAGNOSIS — M25521 Pain in right elbow: Secondary | ICD-10-CM

## 2015-03-17 MED ORDER — METHYLPREDNISOLONE ACETATE 40 MG/ML IJ SUSP
40.0000 mg | Freq: Once | INTRAMUSCULAR | Status: AC
  Administered 2015-03-17: 40 mg via INTRA_ARTICULAR

## 2015-03-17 NOTE — Patient Instructions (Signed)
You have olecranon bursitis. Ice the area 3-4 times a day for 15 minutes at a time Aleve or meloxicam for pain, swelling, and inflammation if needed. ACE wrap for compression to keep swelling down for the next week. Consider aspiration and injection of the area as well - we went ahead with this today.

## 2015-03-18 DIAGNOSIS — M7021 Olecranon bursitis, right elbow: Secondary | ICD-10-CM | POA: Insufficient documentation

## 2015-03-18 NOTE — Assessment & Plan Note (Signed)
History suggests olecranon bursitis - attempted aspiration but with minimal fluid obtained.  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  Possible he had some swelling prior without being aware and concurrent bursitis called his attention to this.  He will ice the area, compress with ace wrap, take tylenol, colchicine.  After informed written consent patient was seated on exam table.  Area overlying right olecranon bursa prepped with alcohol swab.  62mL marcaine used for local anesthesia.  Attempted aspiration with very minimal sanguinous fluid obtained from bursa.  Did not follow with injection.  Patient tolerated procedure well without immediate complications.  Compression wrap placed.

## 2015-03-18 NOTE — Progress Notes (Signed)
PCP and consultation requested by: Nance Pear., NP  Subjective:   HPI: Patient is a 70 y.o. male here for right elbow swelling, pain.  Patient reports at least 3 weeks of posterior elbow swelling with mild pain when compressing the area. Pain currently 0/10 - dull with compression. He is on antibiotics for concern of possible infection. No fevers, chills, other complaints. Has history of gout - taken colchicine, allopurinol  Past Medical History  Diagnosis Date  . Allergy   . Hyperlipidemia   . Hypertension   . Cancer [facility]) 2011    prostate  . Obesity     moderate  . Aortic stenosis   . CAD (coronary artery disease)     cabg  . History of gout   . Heart murmur   . History of hepatitis 1974  . Type II or unspecified type diabetes mellitus without mention of complication, not stated as uncontrolled   . Hemorrhoids   . Sensorineural hearing loss   . Hypertrophic obstructive cardiomyopathy   . Erectile dysfunction   . Hemochromatosis 02/14/2011    Current Outpatient Prescriptions on File Prior to Visit  Medication Sig Dispense Refill  . allopurinol (ZYLOPRIM) 100 MG tablet Take 1 tablet (100 mg total) by mouth daily. 30 tablet 3  . atorvastatin (LIPITOR) 20 MG tablet TAKE ONE (1) TABLET BY MOUTH EVERY DAY 90 tablet 1  . BENICAR HCT 20-12.5 MG tablet TAKE 1/2 TABLET EVERY DAY 45 tablet 1  . cephALEXin (KEFLEX) 500 MG capsule Take 1 capsule (500 mg total) by mouth 3 (three) times daily. 21 capsule 0  . colchicine 0.6 MG tablet 2 tabs by mouth at start or gout flare, then 1 tab 1 hour later.  May repeat following day if no improvement. 9 tablet 5  . fenofibrate (TRICOR) 145 MG tablet Take 1 tablet (145 mg total) by mouth daily. (Patient not taking: Reported on 03/15/2015) 30 tablet 5  . FREESTYLE LITE test strip CHECK BLOOD SUGAR ONCE DAILY 100 each 1  . Insulin Glargine (LANTUS SOLOSTAR) 100 UNIT/ML Solostar Pen Inject 26 Units into the skin daily at 10 pm. 5 pen 5  .  Insulin Pen Needle (PEN NEEDLES) 31G X 6 MM MISC Use as directed 100 each 3  . Lancets (FREESTYLE) lancets Use as directed to check blood sugar twice a day. 100 each 1  . metFORMIN (GLUCOPHAGE) 500 MG tablet TAKE TWO (2) TABLETS BY MOUTH 2 TIMES DAILY 120 tablet 6  . metoprolol succinate (TOPROL-XL) 25 MG 24 hr tablet TAKE 1/2 TABLET BY MOUTH DAILY 45 tablet 1  . Omega-3 Fatty Acids (FISH OIL) 1000 MG CAPS Take 1 capsule by mouth daily.     Marland Kitchen warfarin (COUMADIN) 7.5 MG tablet TAKE 1 AND 1/2 TABLETS BY MOUTH DAILY ORAS DIRECTED BY COUMADIN CLINIC 45 tablet 3   No current facility-administered medications on file prior to visit.    Past Surgical History  Procedure Laterality Date  . Prostate seed implant  3/12  . Coronary artery bypass graft  10/2003  . Aortic valve replacement      [facility]  . Hip surgery  2006    right hip  . Cholecystectomy  1990  . Hernia repair  1999    right, inguinal  . Hernia repair  2002    left, inguinal  . Pilonidal cyst excision  1964  . Tonsillectomy  childhood  . Appendectomy  1990    No Known Allergies  Social History  Social History  . Marital Status: Married    Spouse Name: N/A  . Number of Children: N/A  . Years of Education: N/A   Occupational History  . Not on file.   Social History Main Topics  . Smoking status: Former Smoker    Types: Cigars  . Smokeless tobacco: Never Used     Comment: Never used Tobacco  . Alcohol Use: 0.0 oz/week    0 Standard drinks or equivalent per week     Comment: 4 glasses wine a month  . Drug Use: Not on file  . Sexual Activity: Not on file   Other Topics Concern  . Not on file   Social History Narrative    Family History  Problem Relation Age of Onset  . Heart disease Mother   . Stroke Mother   . Heart disease Father   . Coronary artery disease Brother   . Asperger's syndrome Son   . Hyperlipidemia Son   . Cancer Neg Hx     negative for colon cancer    BP 132/77 mmHg  Pulse  77  Ht 5\' 10"  (1.778 m)  Wt 229 lb (103.874 kg)  BMI 32.86 kg/m2  Review of Systems: See HPI above.    Objective:  Physical Exam:  Gen: NAD  Right elbow: Swelling over olecranon with fluctuance.  No bruising, other deformity.  No warmth or erythema. No tenderness to palpation. FROM elbow without pain. Collateral ligaments intact. 5/5 strength flexion and extension. NVI distally.  Left elbow: FROM without pain.  MSK u/s right elbow: Combination presence of olecranon bursitis though multiloculated with all compartments relatively small and deep to this about half of the swelling accounted by probable tophus.  No neovascularity present.  Assessment & Plan:  1. Right elbow olecranon bursitis - History suggests olecranon bursitis - attempted aspiration but with minimal fluid obtained.  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  Possible he had some swelling prior without being aware and concurrent bursitis called his attention to this.  He will ice the area, compress with ace wrap, take tylenol, colchicine.  After informed written consent patient was seated on exam table.  Area overlying right olecranon bursa prepped with alcohol swab.  40mL marcaine used for local anesthesia.  Attempted aspiration with very minimal sanguinous fluid obtained from bursa.  Did not follow with injection.  Patient tolerated procedure well without immediate complications.  Compression wrap placed.

## 2015-03-23 ENCOUNTER — Encounter: Payer: Self-pay | Admitting: Family

## 2015-03-23 MED ORDER — FREESTYLE LANCETS MISC: Status: DC

## 2015-03-25 ENCOUNTER — Ambulatory Visit (HOSPITAL_BASED_OUTPATIENT_CLINIC_OR_DEPARTMENT_OTHER): Payer: Medicare Other | Admitting: Hematology & Oncology

## 2015-03-25 ENCOUNTER — Other Ambulatory Visit (HOSPITAL_BASED_OUTPATIENT_CLINIC_OR_DEPARTMENT_OTHER): Payer: Medicare Other

## 2015-03-25 ENCOUNTER — Encounter: Payer: Self-pay | Admitting: Hematology & Oncology

## 2015-03-25 ENCOUNTER — Other Ambulatory Visit: Payer: Self-pay | Admitting: Family

## 2015-03-25 ENCOUNTER — Telehealth: Payer: Self-pay | Admitting: *Deleted

## 2015-03-25 DIAGNOSIS — Z8546 Personal history of malignant neoplasm of prostate: Secondary | ICD-10-CM | POA: Diagnosis not present

## 2015-03-25 LAB — CBC WITH DIFFERENTIAL (CANCER CENTER ONLY)
BASO#: 0.1 10*3/uL (ref 0.0–0.2)
BASO%: 0.7 % (ref 0.0–2.0)
EOS ABS: 0.3 10*3/uL (ref 0.0–0.5)
EOS%: 4 % (ref 0.0–7.0)
HEMATOCRIT: 36.4 % — AB (ref 38.7–49.9)
HEMOGLOBIN: 12.7 g/dL — AB (ref 13.0–17.1)
LYMPH#: 1.3 10*3/uL (ref 0.9–3.3)
LYMPH%: 17.3 % (ref 14.0–48.0)
MCH: 30.8 pg (ref 28.0–33.4)
MCHC: 34.9 g/dL (ref 32.0–35.9)
MCV: 88 fL (ref 82–98)
MONO#: 0.5 10*3/uL (ref 0.1–0.9)
MONO%: 6.9 % (ref 0.0–13.0)
NEUT%: 71.1 % (ref 40.0–80.0)
NEUTROS ABS: 5.1 10*3/uL (ref 1.5–6.5)
Platelets: 162 10*3/uL (ref 145–400)
RBC: 4.13 10*6/uL — AB (ref 4.20–5.70)
RDW: 13.3 % (ref 11.1–15.7)
WBC: 7.2 10*3/uL (ref 4.0–10.0)

## 2015-03-25 LAB — COMPREHENSIVE METABOLIC PANEL
ALT: 37 U/L (ref 0–55)
AST: 36 U/L — ABNORMAL HIGH (ref 5–34)
Albumin: 3.8 g/dL (ref 3.5–5.0)
Alkaline Phosphatase: 64 U/L (ref 40–150)
Anion Gap: 9 mEq/L (ref 3–11)
BUN: 26 mg/dL (ref 7.0–26.0)
CHLORIDE: 106 meq/L (ref 98–109)
CO2: 22 meq/L (ref 22–29)
Calcium: 8.8 mg/dL (ref 8.4–10.4)
Creatinine: 1.2 mg/dL (ref 0.7–1.3)
EGFR: 63 mL/min/{1.73_m2} — AB (ref >90)
GLUCOSE: 211 mg/dL — AB (ref 70–140)
POTASSIUM: 4.2 meq/L (ref 3.5–5.1)
SODIUM: 137 meq/L (ref 136–145)
Total Bilirubin: 0.45 mg/dL (ref 0.20–1.20)
Total Protein: 7.2 g/dL (ref 6.4–8.3)

## 2015-03-25 LAB — IRON AND TIBC
%SAT: 18 % — AB (ref 20–55)
Iron: 68 ug/dL (ref 42–163)
TIBC: 384 ug/dL (ref 202–409)
UIBC: 316 ug/dL (ref 117–376)

## 2015-03-25 LAB — FERRITIN: Ferritin: 57 ng/ml (ref 22–316)

## 2015-03-25 NOTE — Telephone Encounter (Addendum)
Detailed message left on home phone.   ----- Message from Volanda Napoleon, MD sent at 03/25/2015  3:03 PM EST ----- Iron level is only 57!!!  This is tremendous!!!  No phlebotomy!!!  pete

## 2015-03-25 NOTE — Progress Notes (Signed)
Hematology and Oncology Follow Up Visit  [name] [id number] [birth year] 70 y.o. 03/25/2015   Principle Diagnosis:  1. Hemochromatosis (H63D homozygous mutation). 2. Stage I prostate cancer, status post radiation therapy and     radiation seeds.  Current Therapy:    Phlebotomy to maintain a ferritin less than 100      Interim History:  [name] is back for followup. We last saw him back in August.. At that point time, his ferritin was 82. His iron saturation was only 27.Marland Kitchen   He did is doing well. He is exercising. He had an enjoyable Christmas holiday.Marland Kitchen  His blood sugars are being watched closely.  He's had no problems with cough. He's had no issues with the flu or pneumonia.Marland Kitchen  He's had no problems with bowels or bladder. He's had no leg swelling.  He says that since he has been exercising, he feels better and that his legs are less swollen.  Overall, his performance status is ECOG 1.    Medications:  Current outpatient prescriptions:  .  allopurinol (ZYLOPRIM) 100 MG tablet, Take 1 tablet (100 mg total) by mouth daily., Disp: 30 tablet, Rfl: 3 .  atorvastatin (LIPITOR) 20 MG tablet, TAKE ONE (1) TABLET BY MOUTH EVERY DAY, Disp: 90 tablet, Rfl: 1 .  BENICAR HCT 20-12.5 MG tablet, TAKE 1/2 TABLET EVERY DAY, Disp: 45 tablet, Rfl: 1 .  cephALEXin (KEFLEX) 500 MG capsule, Take 1 capsule (500 mg total) by mouth 3 (three) times daily., Disp: 21 capsule, Rfl: 0 .  colchicine 0.6 MG tablet, 2 tabs by mouth at start or gout flare, then 1 tab 1 hour later.  May repeat following day if no improvement., Disp: 9 tablet, Rfl: 5 .  fenofibrate (TRICOR) 145 MG tablet, Take 1 tablet (145 mg total) by mouth daily., Disp: 30 tablet, Rfl: 5 .  FREESTYLE LITE test strip, CHECK BLOOD SUGAR ONCE DAILY, Disp: 100 each, Rfl: 1 .  Insulin Glargine (LANTUS SOLOSTAR) 100 UNIT/ML Solostar Pen, Inject 26 Units into the skin daily at 10 pm., Disp: 5 pen, Rfl: 5 .  Insulin Pen Needle (PEN NEEDLES)  31G X 6 MM MISC, Use as directed, Disp: 100 each, Rfl: 3 .  Lancets (FREESTYLE) lancets, Use as directed to check blood sugar twice a day., Disp: 100 each, Rfl: 5 .  metFORMIN (GLUCOPHAGE) 500 MG tablet, TAKE TWO (2) TABLETS BY MOUTH 2 TIMES DAILY, Disp: 120 tablet, Rfl: 6 .  metoprolol succinate (TOPROL-XL) 25 MG 24 hr tablet, TAKE 1/2 TABLET BY MOUTH DAILY, Disp: 45 tablet, Rfl: 1 .  Omega-3 Fatty Acids (FISH OIL) 1000 MG CAPS, Take 1 capsule by mouth daily. , Disp: , Rfl:  .  warfarin (COUMADIN) 7.5 MG tablet, TAKE 1 AND 1/2 TABLETS BY MOUTH DAILY ORAS DIRECTED BY COUMADIN CLINIC, Disp: 45 tablet, Rfl: 3  Allergies: No Known Allergies  Past Medical History, Surgical history, Social history, and Family History were reviewed and updated.  Review of Systems: As above  Physical Exam:  height is 5\' 10"  (1.778 m) and weight is 229 lb (103.874 kg). His oral temperature is 98 F (36.7 C). His blood pressure is 138/62 and his pulse is 71. His respiration is 16.   Well-developed and well-nourished white woman. Head and exam shows no ocular or oral lesions. He has no palpable cervical or supraclavicular lymph nodes. Lungs are clear bilaterally. Cardiac exam regular rate and rhythm with a systolic click consistent with his mechanical valve. . Abdomen is  soft. He has good bowel sounds. There is no fluid wave. He has a well-healed laparotomy scar in the right upper quadrant. There is no palpable liver or spleen tip. Neck exam shows no tenderness over the spine ribs or hips. Extremities shows no clubbing, cyanosis or edema. He has good range of motion of his joints. Skin exam no rashes.   Lab Results  Component Value Date   WBC 7.2 03/25/2015   HGB 12.7* 03/25/2015   HCT 36.4* 03/25/2015   MCV 88 03/25/2015   PLT 162 03/25/2015     Chemistry      Component Value Date/Time   NA 137 01/08/2015 1032   NA 140 09/24/2014 0939   K 4.5 01/08/2015 1032   K 4.0 09/24/2014 0939   CL 101 01/08/2015 1032     CO2 28 01/08/2015 1032   CO2 21* 09/24/2014 0939   BUN 19 01/08/2015 1032   BUN 23.2 09/24/2014 0939   CREATININE 1.08 01/08/2015 1032   CREATININE 1.2 09/24/2014 0939   CREATININE 1.18 05/29/2013 1142      Component Value Date/Time   CALCIUM 9.4 01/08/2015 1032   CALCIUM 9.0 09/24/2014 0939   ALKPHOS 66 09/24/2014 0939   ALKPHOS 66 03/02/2014 1156   AST 42* 09/24/2014 0939   AST 34 03/02/2014 1156   ALT 45 09/24/2014 0939   ALT 34 03/02/2014 1156   BILITOT 0.61 09/24/2014 0939   BILITOT 0.6 03/02/2014 1156         Impression and Plan: [name] is a 19- gentleman with hemochromatosis. He is homozygous for the H63D mutation. He is doing quite well.  He is still able to help take care of his grandson who has special needs. He really enjoys doing this.  I'm sure that his eye says are still okay. Again he was last phlebotomized back in August 2015.  I will see him back in 6 months.  Volanda Napoleon, MD 2/23/201712:55 PM

## 2015-03-30 ENCOUNTER — Encounter: Payer: Self-pay | Admitting: Family

## 2015-03-31 ENCOUNTER — Encounter: Payer: Self-pay | Admitting: Family Medicine

## 2015-04-04 ENCOUNTER — Encounter: Payer: Self-pay | Admitting: Family

## 2015-04-05 ENCOUNTER — Other Ambulatory Visit: Payer: Self-pay | Admitting: Family

## 2015-04-05 NOTE — Telephone Encounter (Signed)
Medication filled to pharmacy as requested.   

## 2015-04-09 ENCOUNTER — Ambulatory Visit (INDEPENDENT_AMBULATORY_CARE_PROVIDER_SITE_OTHER): Payer: Medicare Other | Admitting: Family

## 2015-04-09 ENCOUNTER — Encounter: Payer: Self-pay | Admitting: Family

## 2015-04-09 ENCOUNTER — Other Ambulatory Visit: Payer: Self-pay | Admitting: Family

## 2015-04-09 VITALS — BP 134/63 | HR 68 | Temp 98.2°F | Resp 16 | Ht 70.0 in | Wt 225.4 lb

## 2015-04-09 DIAGNOSIS — E118 Type 2 diabetes mellitus with unspecified complications: Secondary | ICD-10-CM | POA: Diagnosis not present

## 2015-04-09 DIAGNOSIS — E119 Type 2 diabetes mellitus without complications: Secondary | ICD-10-CM | POA: Diagnosis not present

## 2015-04-09 DIAGNOSIS — M109 Gout, unspecified: Secondary | ICD-10-CM

## 2015-04-09 DIAGNOSIS — IMO0002 Reserved for concepts with insufficient information to code with codable children: Secondary | ICD-10-CM

## 2015-04-09 DIAGNOSIS — Z794 Long term (current) use of insulin: Secondary | ICD-10-CM

## 2015-04-09 DIAGNOSIS — E781 Pure hyperglyceridemia: Secondary | ICD-10-CM

## 2015-04-09 DIAGNOSIS — I10 Essential (primary) hypertension: Secondary | ICD-10-CM | POA: Diagnosis not present

## 2015-04-09 DIAGNOSIS — E1165 Type 2 diabetes mellitus with hyperglycemia: Secondary | ICD-10-CM

## 2015-04-09 HISTORY — DX: Gout, unspecified: M10.9

## 2015-04-09 LAB — BASIC METABOLIC PANEL
BUN: 23 mg/dL (ref 6–23)
CHLORIDE: 102 meq/L (ref 96–112)
CO2: 27 mEq/L (ref 19–32)
CREATININE: 1.03 mg/dL (ref 0.40–1.50)
Calcium: 9.6 mg/dL (ref 8.4–10.5)
GFR: 75.87 mL/min (ref >60.00)
Glucose, Bld: 98 mg/dL (ref 70–99)
Potassium: 4.3 mEq/L (ref 3.5–5.1)
Sodium: 138 mEq/L (ref 135–145)

## 2015-04-09 LAB — HEMOGLOBIN A1C: HEMOGLOBIN A1C: 7.2 % — AB (ref 4.6–6.5)

## 2015-04-09 MED ORDER — INSULIN GLARGINE 100 UNIT/ML SOLOSTAR PEN: 28.0000 [IU] | PEN_INJECTOR | Freq: Every day | SUBCUTANEOUS | Status: DC

## 2015-04-09 NOTE — Assessment & Plan Note (Signed)
Stable, declines allopurinol. Uses colchicine prn.

## 2015-04-09 NOTE — Assessment & Plan Note (Signed)
Clinically stable. Obtain bmet/a1c.

## 2015-04-09 NOTE — Progress Notes (Signed)
Subjective:    Patient ID: [name], male    DOB: September 04, 1945, 70 y.o.   MRN: [id number]  HPI  [name] is a 70 yr old male who presents today for follow up of multiple medical problems.  1) DM2- maintained on metformin, lantus (26 units) sugars well controlled at home on this dose.  Lab Results  Component Value Date   HGBA1C 7.3* 01/08/2015   HGBA1C 7.2* 09/09/2014   HGBA1C 7.8* 06/02/2014   Lab Results  Component Value Date   MICROALBUR 8.7* 09/09/2014   LDLCALC 54 11/25/2010   CREATININE 1.2 03/25/2015   2) HTN- on benicar/hct BP Readings from Last 3 Encounters:  04/09/15 134/63  03/25/15 138/62  03/17/15 132/77   3) Hyperlipidemia- on never started tricor. Is taking statin.  Lab Results  Component Value Date   CHOL 118 01/08/2015   HDL 29.00* 01/08/2015   LDLCALC 54 11/25/2010   LDLDIRECT 45.0 01/08/2015   TRIG 388.0* 01/08/2015   CHOLHDL 4 01/08/2015   4) Gout-  Currently on prn colchicine. Reports that when he took allopurinol in the past he felt that it actually increased his flares.   Review of Systems  Respiratory: Negative for shortness of breath.   Cardiovascular: Positive for leg swelling. Negative for chest pain.   Past Medical History  Diagnosis Date  . Allergy   . Hyperlipidemia   . Hypertension   . Cancer [facility]) 2011    prostate  . Obesity     moderate  . Aortic stenosis   . CAD (coronary artery disease)     cabg  . History of gout   . Heart murmur   . History of hepatitis 1974  . Type II or unspecified type diabetes mellitus without mention of complication, not stated as uncontrolled   . Hemorrhoids   . Sensorineural hearing loss   . Hypertrophic obstructive cardiomyopathy   . Erectile dysfunction   . Hemochromatosis 02/14/2011    Social History   Social History  . Marital Status: Married    Spouse Name: N/A  . Number of Children: N/A  . Years of Education: N/A   Occupational History  . Not on file.   Social History  Main Topics  . Smoking status: Former Smoker    Types: Cigars  . Smokeless tobacco: Never Used     Comment: Never used Tobacco  . Alcohol Use: 0.0 oz/week    0 Standard drinks or equivalent per week     Comment: 4 glasses wine a month  . Drug Use: Not on file  . Sexual Activity: Not on file   Other Topics Concern  . Not on file   Social History Narrative    Past Surgical History  Procedure Laterality Date  . Prostate seed implant  3/12  . Coronary artery bypass graft  10/2003  . Aortic valve replacement      [facility]  . Hip surgery  2006    right hip  . Cholecystectomy  1990  . Hernia repair  1999    right, inguinal  . Hernia repair  2002    left, inguinal  . Pilonidal cyst excision  1964  . Tonsillectomy  childhood  . Appendectomy  1990    Family History  Problem Relation Age of Onset  . Heart disease Mother   . Stroke Mother   . Heart disease Father   . Coronary artery disease Brother   . Asperger's syndrome Son   .  Hyperlipidemia Son   . Cancer Neg Hx     negative for colon cancer    No Known Allergies  Current Outpatient Prescriptions on File Prior to Visit  Medication Sig Dispense Refill  . atorvastatin (LIPITOR) 20 MG tablet TAKE ONE (1) TABLET BY MOUTH EVERY DAY 90 tablet 1  . BENICAR HCT 20-12.5 MG tablet TAKE 1/2 TABLET EVERY DAY 45 tablet 1  . colchicine 0.6 MG tablet 2 tabs by mouth at start or gout flare, then 1 tab 1 hour later.  May repeat following day if no improvement. 9 tablet 5  . FREESTYLE LITE test strip CHECK BLOOD SUGAR ONCE DAILY 100 each 1  . Insulin Glargine (LANTUS SOLOSTAR) 100 UNIT/ML Solostar Pen Inject 26 Units into the skin daily at 10 pm. 5 pen 5  . Lancets (FREESTYLE) lancets Use as directed to check blood sugar twice a day. 100 each 5  . metFORMIN (GLUCOPHAGE) 500 MG tablet TAKE TWO (2) TABLETS BY MOUTH 2 TIMES DAILY 120 tablet 5  . metoprolol succinate (TOPROL-XL) 25 MG 24 hr tablet TAKE 1/2 TABLET BY MOUTH DAILY 45  tablet 1  . Omega-3 Fatty Acids (FISH OIL) 1000 MG CAPS Take 1 capsule by mouth daily.     . QC PEN NEEDLES 31G X 6 MM MISC USE AS DIRECTED 100 each 1  . warfarin (COUMADIN) 7.5 MG tablet TAKE 1 AND 1/2 TABLETS BY MOUTH DAILY ORAS DIRECTED BY COUMADIN CLINIC 45 tablet 3  . fenofibrate (TRICOR) 145 MG tablet Take 1 tablet (145 mg total) by mouth daily. (Patient not taking: Reported on 04/09/2015) 30 tablet 5   No current facility-administered medications on file prior to visit.    BP 134/63 mmHg  Pulse 68  Temp(Src) 98.2 F (36.8 C) (Oral)  Resp 16  Ht 5\' 10"  (1.778 m)  Wt 225 lb 6.4 oz (102.241 kg)  BMI 32.34 kg/m2  SpO2 98%       Objective:   Physical Exam  Constitutional: He is oriented to person, place, and time. He appears well-developed and well-nourished. No distress.  HENT:  Head: Normocephalic and atraumatic.  Cardiovascular: Normal rate and regular rhythm.   No murmur heard. Pulmonary/Chest: Effort normal and breath sounds normal. No respiratory distress. He has no wheezes. He has no rales.  Musculoskeletal: He exhibits edema.  Neurological: He is alert and oriented to person, place, and time.  Skin: Skin is warm and dry.  Psychiatric: He has a normal mood and affect. His behavior is normal. Thought content normal.          Assessment & Plan:

## 2015-04-09 NOTE — Assessment & Plan Note (Signed)
BP stable, continue current meds. 

## 2015-04-09 NOTE — Patient Instructions (Signed)
Please complete lab work prior to leaving. Begin Tricor (fenofibrate) to help lower your triglycerides.

## 2015-04-09 NOTE — Assessment & Plan Note (Signed)
Start tricor, plan FLP in 3 months.

## 2015-04-09 NOTE — Progress Notes (Signed)
Pre visit review using our clinic review tool, if applicable. No additional management support is needed unless otherwise documented below in the visit note. 

## 2015-04-20 ENCOUNTER — Encounter: Payer: Self-pay | Admitting: Family

## 2015-04-21 MED ORDER — PEN NEEDLES 32G X 4 MM MISC: 1.0000 | Freq: Every evening | Status: DC

## 2015-04-21 NOTE — Telephone Encounter (Signed)
Rx sent 

## 2015-04-21 NOTE — Telephone Encounter (Signed)
Could you please send the pen refills? thanks

## 2015-04-22 ENCOUNTER — Other Ambulatory Visit: Payer: Self-pay | Admitting: Cardiology

## 2015-04-22 NOTE — Telephone Encounter (Signed)
Not sure if he's non-compliance issue for you.

## 2015-04-23 ENCOUNTER — Ambulatory Visit (INDEPENDENT_AMBULATORY_CARE_PROVIDER_SITE_OTHER): Payer: Medicare Other | Admitting: *Deleted

## 2015-04-23 DIAGNOSIS — I359 Nonrheumatic aortic valve disorder, unspecified: Secondary | ICD-10-CM | POA: Diagnosis not present

## 2015-04-23 DIAGNOSIS — Z954 Presence of other heart-valve replacement: Secondary | ICD-10-CM

## 2015-04-23 DIAGNOSIS — Z952 Presence of prosthetic heart valve: Secondary | ICD-10-CM

## 2015-04-23 LAB — POCT INR: INR: 2.9

## 2015-04-23 MED ORDER — WARFARIN SODIUM 7.5 MG PO TABS: ORAL_TABLET | ORAL | Status: DC

## 2015-04-30 ENCOUNTER — Encounter: Payer: Self-pay | Admitting: Family

## 2015-04-30 ENCOUNTER — Encounter: Payer: Self-pay | Admitting: Family Medicine

## 2015-05-05 ENCOUNTER — Other Ambulatory Visit: Payer: Self-pay | Admitting: Family

## 2015-05-05 DIAGNOSIS — E119 Type 2 diabetes mellitus without complications: Secondary | ICD-10-CM | POA: Diagnosis not present

## 2015-05-05 DIAGNOSIS — G43809 Other migraine, not intractable, without status migrainosus: Secondary | ICD-10-CM | POA: Diagnosis not present

## 2015-05-05 DIAGNOSIS — H01004 Unspecified blepharitis left upper eyelid: Secondary | ICD-10-CM | POA: Diagnosis not present

## 2015-05-05 DIAGNOSIS — H52223 Regular astigmatism, bilateral: Secondary | ICD-10-CM | POA: Diagnosis not present

## 2015-05-05 DIAGNOSIS — H01001 Unspecified blepharitis right upper eyelid: Secondary | ICD-10-CM | POA: Diagnosis not present

## 2015-05-05 DIAGNOSIS — H524 Presbyopia: Secondary | ICD-10-CM | POA: Diagnosis not present

## 2015-05-05 DIAGNOSIS — E113291 Type 2 diabetes mellitus with mild nonproliferative diabetic retinopathy without macular edema, right eye: Secondary | ICD-10-CM | POA: Diagnosis not present

## 2015-05-05 LAB — HM DIABETES EYE EXAM

## 2015-05-11 ENCOUNTER — Encounter: Payer: Self-pay | Admitting: Family

## 2015-05-28 ENCOUNTER — Encounter: Payer: Self-pay | Admitting: Certified Registered Nurse Anesthetist

## 2015-06-01 ENCOUNTER — Telehealth: Payer: Self-pay | Admitting: *Deleted

## 2015-06-01 MED ORDER — FENOFIBRATE 145 MG PO TABS: 145.0000 mg | ORAL_TABLET | Freq: Every day | ORAL | Status: DC

## 2015-06-01 NOTE — Telephone Encounter (Signed)
Pt dropped off glucometer readings for March and April. Also states he lost fenofibrate prescription and needs refill. Refills sent. Pt notified. Please advise glucometer readings?

## 2015-06-04 ENCOUNTER — Ambulatory Visit (INDEPENDENT_AMBULATORY_CARE_PROVIDER_SITE_OTHER): Payer: Medicare Other

## 2015-06-04 DIAGNOSIS — I359 Nonrheumatic aortic valve disorder, unspecified: Secondary | ICD-10-CM

## 2015-06-04 DIAGNOSIS — Z952 Presence of prosthetic heart valve: Secondary | ICD-10-CM

## 2015-06-04 DIAGNOSIS — Z954 Presence of other heart-valve replacement: Secondary | ICD-10-CM

## 2015-06-04 LAB — POCT INR: INR: 4.7

## 2015-06-04 NOTE — Telephone Encounter (Signed)
Reviewed readings.  Sugars 104-130. See mychart.

## 2015-06-05 ENCOUNTER — Encounter: Payer: Self-pay | Admitting: Family Medicine

## 2015-06-10 ENCOUNTER — Telehealth: Payer: Self-pay | Admitting: *Deleted

## 2015-06-10 NOTE — Telephone Encounter (Signed)
Confirmed that the patient has verbally agreed to enroll in the Chronic Care Management Program and reiterated that it's designed to help optimize his health and achieve monthly goals. Patient has been made aware of the fee associated with this program.  Time Spent: 05:42 minutes  Pt did not have time to review care plan at time of call. Plan made w/ pt for me to call back on Monday 06/21/15 between 8:30-9am.

## 2015-06-18 ENCOUNTER — Ambulatory Visit (INDEPENDENT_AMBULATORY_CARE_PROVIDER_SITE_OTHER): Payer: Medicare Other | Admitting: *Deleted

## 2015-06-18 DIAGNOSIS — I359 Nonrheumatic aortic valve disorder, unspecified: Secondary | ICD-10-CM

## 2015-06-18 DIAGNOSIS — Z954 Presence of other heart-valve replacement: Secondary | ICD-10-CM

## 2015-06-18 DIAGNOSIS — Z952 Presence of prosthetic heart valve: Secondary | ICD-10-CM

## 2015-06-18 LAB — POCT INR: INR: 4.4

## 2015-06-21 NOTE — Telephone Encounter (Signed)
Can be reached: [phone]   Reason for call: pt called back. Apologized that he had answering machine off. Please call him later this afternoon. He is picking up his grandson from school now.

## 2015-06-21 NOTE — Telephone Encounter (Signed)
Time Spent: 14:27  HTN: reviewed medications w/ pt. He reports compliance w/ ordered medications and BP is stable at this time. Goal <140/90.  DM: Last A1c 7.2 on 04/09/15, goal <7. Pt reports compliance w/ medications. Endorses occasional pain at insulin injection site. Encouraged pt to rotate sites if he experiencing tenderness, which he states he is doing. Checking fasting CBG almost every morning, ranging from 109-135, reports usually 110s-120s.   Hypertriglyceridemia: Pt reports compliance w/ fenofibrate. He is exercising about 2.5 days/week for 20-25 minutes. He goes to the gym w/ a friend and does upper body workouts, some cardio on the elliptical for < 7 minutes.   Discussed w/ pt the importance of regular aerobic exercise for maintaining good control of DM, HTN, and lipids. Pt scheduled for routine 3 month follow-up appt w/ [name], NP on 07/09/15.

## 2015-06-30 ENCOUNTER — Encounter: Payer: Self-pay | Admitting: Family

## 2015-07-02 ENCOUNTER — Ambulatory Visit (INDEPENDENT_AMBULATORY_CARE_PROVIDER_SITE_OTHER): Payer: Medicare Other | Admitting: Pharmacist

## 2015-07-02 DIAGNOSIS — I359 Nonrheumatic aortic valve disorder, unspecified: Secondary | ICD-10-CM | POA: Diagnosis not present

## 2015-07-02 DIAGNOSIS — Z954 Presence of other heart-valve replacement: Secondary | ICD-10-CM

## 2015-07-02 DIAGNOSIS — Z952 Presence of prosthetic heart valve: Secondary | ICD-10-CM

## 2015-07-02 LAB — POCT INR: INR: 4.2

## 2015-07-09 ENCOUNTER — Ambulatory Visit (INDEPENDENT_AMBULATORY_CARE_PROVIDER_SITE_OTHER): Payer: Medicare Other | Admitting: Family

## 2015-07-09 ENCOUNTER — Encounter: Payer: Self-pay | Admitting: Family

## 2015-07-09 VITALS — BP 126/66 | HR 66 | Temp 98.0°F | Resp 18 | Ht 70.0 in | Wt 224.2 lb

## 2015-07-09 DIAGNOSIS — E118 Type 2 diabetes mellitus with unspecified complications: Secondary | ICD-10-CM

## 2015-07-09 DIAGNOSIS — I10 Essential (primary) hypertension: Secondary | ICD-10-CM

## 2015-07-09 DIAGNOSIS — E1129 Type 2 diabetes mellitus with other diabetic kidney complication: Secondary | ICD-10-CM | POA: Diagnosis not present

## 2015-07-09 DIAGNOSIS — Z794 Long term (current) use of insulin: Secondary | ICD-10-CM

## 2015-07-09 DIAGNOSIS — E781 Pure hyperglyceridemia: Secondary | ICD-10-CM | POA: Diagnosis not present

## 2015-07-09 DIAGNOSIS — E1165 Type 2 diabetes mellitus with hyperglycemia: Secondary | ICD-10-CM

## 2015-07-09 DIAGNOSIS — IMO0002 Reserved for concepts with insufficient information to code with codable children: Secondary | ICD-10-CM

## 2015-07-09 NOTE — Assessment & Plan Note (Addendum)
Stable on benicar HCT, continue same.

## 2015-07-09 NOTE — Progress Notes (Signed)
Subjective:    Patient ID: [name], male    DOB: 1945/09/15, 70 y.o.   MRN: [id number]  HPI  [name] is a 70 yr old male who presents today for follow up:  1) DM2- on metformin.  Lab Results  Component Value Date   HGBA1C 7.2* 04/09/2015   HGBA1C 7.3* 01/08/2015   HGBA1C 7.2* 09/09/2014   Lab Results  Component Value Date   MICROALBUR 8.7* 09/09/2014   LDLCALC 54 11/25/2010   CREATININE 1.03 04/09/2015   2) HTN- on benicar HCT BP Readings from Last 3 Encounters:  07/09/15 126/66  04/09/15 134/63  03/25/15 138/62   3) Hyperlipidemia- on atorvastatin.   Lab Results  Component Value Date   CHOL 118 01/08/2015   HDL 29.00* 01/08/2015   LDLCALC 54 11/25/2010   LDLDIRECT 45.0 01/08/2015   TRIG 388.0* 01/08/2015   CHOLHDL 4 01/08/2015    Review of Systems  Respiratory: Negative for shortness of breath.   Cardiovascular: Positive for leg swelling. Negative for chest pain.     Past Medical History  Diagnosis Date  . Allergy   . Hyperlipidemia   . Hypertension   . Cancer [facility]) 2011    prostate  . Obesity     moderate  . Aortic stenosis   . CAD (coronary artery disease)     cabg  . History of gout   . Heart murmur   . History of hepatitis 1974  . Type II or unspecified type diabetes mellitus without mention of complication, not stated as uncontrolled   . Hemorrhoids   . Sensorineural hearing loss   . Hypertrophic obstructive cardiomyopathy   . Erectile dysfunction   . Hemochromatosis 02/14/2011     Social History   Social History  . Marital Status: Married    Spouse Name: N/A  . Number of Children: N/A  . Years of Education: N/A   Occupational History  . Not on file.   Social History Main Topics  . Smoking status: Former Smoker    Types: Cigars  . Smokeless tobacco: Never Used     Comment: Never used Tobacco  . Alcohol Use: 0.0 oz/week    0 Standard drinks or equivalent per week     Comment: 4 glasses wine a month  . Drug Use:  Not on file  . Sexual Activity: Not on file   Other Topics Concern  . Not on file   Social History Narrative    Past Surgical History  Procedure Laterality Date  . Prostate seed implant  3/12  . Coronary artery bypass graft  10/2003  . Aortic valve replacement      [facility]  . Hip surgery  2006    right hip  . Cholecystectomy  1990  . Hernia repair  1999    right, inguinal  . Hernia repair  2002    left, inguinal  . Pilonidal cyst excision  1964  . Tonsillectomy  childhood  . Appendectomy  1990    Family History  Problem Relation Age of Onset  . Heart disease Mother   . Stroke Mother   . Heart disease Father   . Coronary artery disease Brother   . Asperger's syndrome Son   . Hyperlipidemia Son   . Cancer Neg Hx     negative for colon cancer    No Known Allergies  Current Outpatient Prescriptions on File Prior to Visit  Medication Sig Dispense Refill  . atorvastatin (LIPITOR) 20  MG tablet TAKE ONE (1) TABLET BY MOUTH EVERY DAY 90 tablet 1  . BENICAR HCT 20-12.5 MG tablet TAKE 1/2 TABLET EVERY DAY 45 tablet 1  . colchicine 0.6 MG tablet 2 tabs by mouth at start or gout flare, then 1 tab 1 hour later.  May repeat following day if no improvement. 9 tablet 5  . fenofibrate (TRICOR) 145 MG tablet Take 1 tablet (145 mg total) by mouth daily. 30 tablet 5  . FREESTYLE LITE test strip CHECK BLOOD SUGAR ONCE DAILY 100 each 1  . Insulin Glargine (LANTUS SOLOSTAR) 100 UNIT/ML Solostar Pen Inject 28 Units into the skin daily at 10 pm. 5 pen 5  . Insulin Pen Needle (PEN NEEDLES) 32G X 4 MM MISC Inject 1 each into the skin every evening. DX  E11.8 90 each 1  . Lancets (FREESTYLE) lancets Use as directed to check blood sugar twice a day. 100 each 5  . metFORMIN (GLUCOPHAGE) 500 MG tablet TAKE TWO (2) TABLETS BY MOUTH 2 TIMES DAILY 120 tablet 5  . metoprolol succinate (TOPROL-XL) 25 MG 24 hr tablet TAKE 1/2 TABLET EVERY DAY 45 tablet 1  . Omega-3 Fatty Acids (FISH OIL) 1000  MG CAPS Take 1 capsule by mouth daily.     Marland Kitchen warfarin (COUMADIN) 7.5 MG tablet Take as directed by coumadin clinic 40 tablet 3   No current facility-administered medications on file prior to visit.    BP 126/66 mmHg  Pulse 66  Temp(Src) 98 F (36.7 C) (Oral)  Resp 18  Ht 5\' 10"  (1.778 m)  Wt 224 lb 3.2 oz (101.696 kg)  BMI 32.17 kg/m2  SpO2 98%       Objective:   Physical Exam  Constitutional: He is oriented to person, place, and time. He appears well-developed and well-nourished. No distress.  HENT:  Head: Normocephalic and atraumatic.  Cardiovascular: Normal rate and regular rhythm.   No murmur heard. Pulmonary/Chest: Effort normal and breath sounds normal. No respiratory distress. He has no wheezes. He has no rales.  Musculoskeletal: He exhibits no edema.  2+ bilateral LE edema  Neurological: He is alert and oriented to person, place, and time.  Skin: Skin is warm and dry.  Psychiatric: He has a normal mood and affect. His behavior is normal. Thought content normal.          Assessment & Plan:

## 2015-07-09 NOTE — Progress Notes (Signed)
Pre visit review using our clinic review tool, if applicable. No additional management support is needed unless otherwise documented below in the visit note. 

## 2015-07-09 NOTE — Assessment & Plan Note (Signed)
On statin/fenofibrate- obtain follow up FLP.

## 2015-07-09 NOTE — Patient Instructions (Addendum)
Schedule lab visit at the front desk.

## 2015-07-09 NOTE — Assessment & Plan Note (Signed)
Stable on current meds, continue same.  

## 2015-07-12 ENCOUNTER — Other Ambulatory Visit: Payer: Self-pay | Admitting: Family

## 2015-07-13 ENCOUNTER — Other Ambulatory Visit: Payer: Medicare Other

## 2015-07-14 ENCOUNTER — Ambulatory Visit: Payer: Self-pay | Admitting: *Deleted

## 2015-07-14 ENCOUNTER — Other Ambulatory Visit (INDEPENDENT_AMBULATORY_CARE_PROVIDER_SITE_OTHER): Payer: Medicare Other

## 2015-07-14 DIAGNOSIS — E1165 Type 2 diabetes mellitus with hyperglycemia: Secondary | ICD-10-CM

## 2015-07-14 DIAGNOSIS — E1129 Type 2 diabetes mellitus with other diabetic kidney complication: Secondary | ICD-10-CM | POA: Diagnosis not present

## 2015-07-14 DIAGNOSIS — IMO0002 Reserved for concepts with insufficient information to code with codable children: Secondary | ICD-10-CM

## 2015-07-14 DIAGNOSIS — E781 Pure hyperglyceridemia: Secondary | ICD-10-CM

## 2015-07-14 DIAGNOSIS — E118 Type 2 diabetes mellitus with unspecified complications: Principal | ICD-10-CM

## 2015-07-14 DIAGNOSIS — Z794 Long term (current) use of insulin: Principal | ICD-10-CM

## 2015-07-14 LAB — BASIC METABOLIC PANEL
BUN: 30 mg/dL — ABNORMAL HIGH (ref 6–23)
CALCIUM: 9.4 mg/dL (ref 8.4–10.5)
CO2: 25 mEq/L (ref 19–32)
Chloride: 104 mEq/L (ref 96–112)
Creatinine, Ser: 1.29 mg/dL (ref 0.40–1.50)
GFR: 58.47 mL/min — AB (ref >60.00)
Glucose, Bld: 139 mg/dL — ABNORMAL HIGH (ref 70–99)
POTASSIUM: 4.5 meq/L (ref 3.5–5.1)
SODIUM: 138 meq/L (ref 135–145)

## 2015-07-14 LAB — HEMOGLOBIN A1C: HEMOGLOBIN A1C: 7.2 % — AB (ref 4.6–6.5)

## 2015-07-15 ENCOUNTER — Other Ambulatory Visit (INDEPENDENT_AMBULATORY_CARE_PROVIDER_SITE_OTHER): Payer: Medicare Other

## 2015-07-15 ENCOUNTER — Encounter: Payer: Self-pay | Admitting: Family

## 2015-07-15 ENCOUNTER — Ambulatory Visit (INDEPENDENT_AMBULATORY_CARE_PROVIDER_SITE_OTHER): Payer: Medicare Other | Admitting: Pharmacist

## 2015-07-15 DIAGNOSIS — Z952 Presence of prosthetic heart valve: Secondary | ICD-10-CM

## 2015-07-15 DIAGNOSIS — E781 Pure hyperglyceridemia: Secondary | ICD-10-CM

## 2015-07-15 DIAGNOSIS — Z954 Presence of other heart-valve replacement: Secondary | ICD-10-CM

## 2015-07-15 DIAGNOSIS — I359 Nonrheumatic aortic valve disorder, unspecified: Secondary | ICD-10-CM

## 2015-07-15 LAB — POCT INR: INR: 2.7

## 2015-07-15 LAB — LDL CHOLESTEROL, DIRECT: LDL DIRECT: 80 mg/dL

## 2015-07-15 LAB — LIPID PANEL
Cholesterol: 144 mg/dL (ref 0–200)
HDL: 30.9 mg/dL — AB (ref >39.00)
NONHDL: 113.45
TRIGLYCERIDES: 299 mg/dL — AB (ref 0.0–149.0)
Total CHOL/HDL Ratio: 5
VLDL: 59.8 mg/dL — AB (ref 0.0–40.0)

## 2015-07-15 NOTE — Telephone Encounter (Signed)
Please advise in [name]'s absence. Thanks!

## 2015-07-15 NOTE — Progress Notes (Signed)
Chart reviewed. Per last OV note, pt was to have FLP drawn w/ labs. However, it was not ordered and not drawn when pt came in for lab appt. Add-on request faxed.

## 2015-07-15 NOTE — Telephone Encounter (Signed)
Opened to review 

## 2015-07-18 ENCOUNTER — Telehealth: Payer: Self-pay | Admitting: Family

## 2015-07-18 NOTE — Telephone Encounter (Signed)
See mychart.  

## 2015-07-19 NOTE — Progress Notes (Signed)
07/19/2015 Unable to reach patient at time of CCM Call. Will attempt call back at another time. 07/21/2015 4:40 PM CCM call completed   Last OV: 07/09/15 Next appt: 10/15/15   Medications reviewed/reconciled: yes Medication adherence: yes Side effects/intolerances reviewed: yes, no issues reported Refills needed:no Relevant labs reviewed: yes  Care Teams:   Patient Care Team: [name], NP as PCP - General Franchot Gallo, MD as Consulting Physician (Urology) Calvert Cantor, MD as Consulting Physician (Ophthalmology) Volanda Napoleon, MD as Consulting Physician (Oncology) Lelon Perla, MD as Consulting Physician (Cardiology)  Patient Active Problem List   Diagnosis Date Noted  . Gout 04/09/2015  . Olecranon bursitis of right elbow 03/18/2015  . Right knee pain 12/21/2014  . Ventral hernia 03/03/2014  . History of prostate cancer 11/28/2013  . Gross hematuria 05/29/2013  . OSA (obstructive sleep apnea) 07/12/2012  . DM (diabetes mellitus), type 2, uncontrolled with complications ([place]) 0000000  . Rash and nonspecific skin eruption 05/26/2012  . Hemochromatosis 02/14/2011  . Actinic keratosis 02/14/2011  . Hypertriglyceridemia 06/13/2010  . HEMORRHOIDS, EXTERNAL 03/21/2010  . AORTIC VALVE REPLACEMENT, HX OF 10/13/2009  . ADENOCARCINOMA, PROSTATE 06/17/2009  . TOBACCO USER 06/16/2009  . HEARING LOSS, SENSORINEURAL, BILATERAL 06/16/2009  . Essential hypertension 06/16/2009  . CORONARY ATHEROSCLEROSIS NATIVE CORONARY ARTERY 06/15/2009  . ERECTILE DYSFUNCTION, ORGANIC 05/18/2009  . Hypertrophic obstructive cardiomyopathy ([place]) 05/06/2008  . Pure hypercholesterolemia 04/29/2008  . OBESITY, MODERATE 04/29/2008  . CAD, ARTERY BYPASS GRAFT 04/29/2008     Health Maintenance: Due for foot exam  Health Logs: Daily fasting CBGs  Goals Reviewed: yes Goals    . HEMOGLOBIN A1C < 7.0     Lantus increased to 31 units nightly 07/18/2015.    . Increase physical activity      As tolerated.       Progress towards goals: A1c still above goal at 7.2. Pt counseled on diet and exercise. States he really loves white potatoes and pasta. He is going to try to incorporate more sweet potatoes. His fasting CBGs average 119-131.   Barriers Identified: yes, pt keeps his 66 y/o grandson during the summer and is not able to exercise as much during this time. He does not like the taste of whole grain pasta, "tastes like cardboard," so usually eats white pasta.  Any patient concerns?: no  Plan: Schedule follow-up appt w/ Dr. Stanford Breed. Follow-up appt w/ [name] in 3 months (appt scheduled).   Time Spent: 26 minutes  Dorrene German, RN

## 2015-07-20 NOTE — Patient Instructions (Addendum)
Increase physical activity as your summer schedule allows. Focus on cutting white carbohydrates and replacing these items w/ whole grain or other options. Schedule your follow-up appt with Dr. Stanford Breed. Call us with any questions or concerns.

## 2015-07-24 ENCOUNTER — Encounter: Payer: Self-pay | Admitting: Family

## 2015-07-26 NOTE — Telephone Encounter (Signed)
Information copied to pt's chart and forwarded to PCP for recommendations.

## 2015-07-27 NOTE — Progress Notes (Signed)
Noted  

## 2015-08-04 ENCOUNTER — Encounter: Payer: Self-pay | Admitting: Family

## 2015-08-06 MED ORDER — OLMESARTAN MEDOXOMIL 20 MG PO TABS: 10.0000 mg | ORAL_TABLET | Freq: Every day | ORAL | Status: DC

## 2015-08-06 MED ORDER — HYDROCHLOROTHIAZIDE 25 MG PO TABS: ORAL_TABLET | ORAL | Status: DC

## 2015-08-06 NOTE — Addendum Note (Signed)
Addended by: [name] on: 08/06/2015 08:17 AM   Modules accepted: Orders, Medications

## 2015-08-10 ENCOUNTER — Ambulatory Visit (INDEPENDENT_AMBULATORY_CARE_PROVIDER_SITE_OTHER): Payer: Medicare Other | Admitting: Pharmacist

## 2015-08-10 DIAGNOSIS — Z954 Presence of other heart-valve replacement: Secondary | ICD-10-CM | POA: Diagnosis not present

## 2015-08-10 DIAGNOSIS — Z952 Presence of prosthetic heart valve: Secondary | ICD-10-CM

## 2015-08-10 DIAGNOSIS — I359 Nonrheumatic aortic valve disorder, unspecified: Secondary | ICD-10-CM | POA: Diagnosis not present

## 2015-08-10 LAB — POCT INR: INR: 3.2

## 2015-08-11 ENCOUNTER — Encounter: Payer: Self-pay | Admitting: Family

## 2015-08-24 ENCOUNTER — Encounter: Payer: Self-pay | Admitting: *Deleted

## 2015-08-28 ENCOUNTER — Other Ambulatory Visit: Payer: Self-pay | Admitting: Family

## 2015-08-30 ENCOUNTER — Encounter: Payer: Self-pay | Admitting: Family

## 2015-08-30 NOTE — Telephone Encounter (Signed)
Message routed to PCP.

## 2015-09-02 ENCOUNTER — Encounter: Payer: Self-pay | Admitting: Family

## 2015-09-07 ENCOUNTER — Ambulatory Visit (INDEPENDENT_AMBULATORY_CARE_PROVIDER_SITE_OTHER): Payer: Medicare Other

## 2015-09-07 DIAGNOSIS — Z954 Presence of other heart-valve replacement: Secondary | ICD-10-CM

## 2015-09-07 DIAGNOSIS — I359 Nonrheumatic aortic valve disorder, unspecified: Secondary | ICD-10-CM

## 2015-09-07 DIAGNOSIS — Z952 Presence of prosthetic heart valve: Secondary | ICD-10-CM

## 2015-09-07 LAB — POCT INR: INR: 2.5

## 2015-09-13 ENCOUNTER — Encounter: Payer: Self-pay | Admitting: Family

## 2015-09-14 ENCOUNTER — Other Ambulatory Visit: Payer: Self-pay | Admitting: Family

## 2015-09-23 ENCOUNTER — Encounter: Payer: Self-pay | Admitting: Hematology & Oncology

## 2015-09-23 ENCOUNTER — Ambulatory Visit (HOSPITAL_BASED_OUTPATIENT_CLINIC_OR_DEPARTMENT_OTHER): Payer: Medicare Other | Admitting: Hematology & Oncology

## 2015-09-23 ENCOUNTER — Other Ambulatory Visit (HOSPITAL_BASED_OUTPATIENT_CLINIC_OR_DEPARTMENT_OTHER): Payer: Medicare Other

## 2015-09-23 DIAGNOSIS — Z8546 Personal history of malignant neoplasm of prostate: Secondary | ICD-10-CM | POA: Diagnosis not present

## 2015-09-23 LAB — COMPREHENSIVE METABOLIC PANEL
ALK PHOS: 46 U/L (ref 40–150)
ALT: 29 U/L (ref 0–55)
ANION GAP: 7 meq/L (ref 3–11)
AST: 41 U/L — ABNORMAL HIGH (ref 5–34)
Albumin: 3.8 g/dL (ref 3.5–5.0)
BUN: 28.4 mg/dL — ABNORMAL HIGH (ref 7.0–26.0)
CO2: 25 meq/L (ref 22–29)
Calcium: 9.5 mg/dL (ref 8.4–10.4)
Chloride: 106 mEq/L (ref 98–109)
Creatinine: 1.3 mg/dL (ref 0.7–1.3)
EGFR: 55 mL/min/{1.73_m2} — AB (ref >90)
GLUCOSE: 128 mg/dL (ref 70–140)
POTASSIUM: 4.4 meq/L (ref 3.5–5.1)
SODIUM: 138 meq/L (ref 136–145)
Total Bilirubin: 0.46 mg/dL (ref 0.20–1.20)
Total Protein: 7.4 g/dL (ref 6.4–8.3)

## 2015-09-23 LAB — CBC WITH DIFFERENTIAL (CANCER CENTER ONLY)
BASO#: 0 10*3/uL (ref 0.0–0.2)
BASO%: 0.5 % (ref 0.0–2.0)
EOS%: 3.5 % (ref 0.0–7.0)
Eosinophils Absolute: 0.3 10*3/uL (ref 0.0–0.5)
HEMATOCRIT: 35.6 % — AB (ref 38.7–49.9)
HEMOGLOBIN: 12.5 g/dL — AB (ref 13.0–17.1)
LYMPH#: 1.1 10*3/uL (ref 0.9–3.3)
LYMPH%: 13.5 % — ABNORMAL LOW (ref 14.0–48.0)
MCH: 31.7 pg (ref 28.0–33.4)
MCHC: 35.1 g/dL (ref 32.0–35.9)
MCV: 90 fL (ref 82–98)
MONO#: 0.5 10*3/uL (ref 0.1–0.9)
MONO%: 6.4 % (ref 0.0–13.0)
NEUT%: 76.1 % (ref 40.0–80.0)
NEUTROS ABS: 6.1 10*3/uL (ref 1.5–6.5)
Platelets: 202 10*3/uL (ref 145–400)
RBC: 3.94 10*6/uL — AB (ref 4.20–5.70)
RDW: 12.8 % (ref 11.1–15.7)
WBC: 8 10*3/uL (ref 4.0–10.0)

## 2015-09-23 LAB — IRON AND TIBC
%SAT: 18 % — AB (ref 20–55)
IRON: 88 ug/dL (ref 42–163)
TIBC: 484 ug/dL — AB (ref 202–409)
UIBC: 396 ug/dL — AB (ref 117–376)

## 2015-09-23 LAB — FERRITIN: FERRITIN: 65 ng/mL (ref 22–316)

## 2015-09-23 NOTE — Progress Notes (Signed)
Hematology and Oncology Follow Up Visit  [name] [id number] 1945-04-03 70 y.o. 09/23/2015   Principle Diagnosis:  1. Hemochromatosis (H63D homozygous mutation). 2. Stage I prostate cancer, status post radiation therapy and     radiation seeds.  Current Therapy:    Phlebotomy to maintain a ferritin less than 100      Interim History:  [name] is back for followup. We last saw him back in February.. At that point time, his ferritin was 57. His iron saturation was only 18.   He did is doing well. He is exercising. He had an enjoyable summer.   His blood sugars are being watched closely.  He's had no problems with cough. He's had no issues with the flu or pneumonia.Marland Kitchen  He's had no problems with bowels or bladder. He does go back to see the urologist in a couple weeks.   He says that since he has been exercising, he feels better and that his legs are less swollen.  Overall, his performance status is ECOG 1.    Medications:  Current Outpatient Prescriptions:  .  atorvastatin (LIPITOR) 20 MG tablet, TAKE ONE (1) TABLET EACH DAY, Disp: 90 tablet, Rfl: 1 .  colchicine 0.6 MG tablet, 2 tabs by mouth at start or gout flare, then 1 tab 1 hour later.  May repeat following day if no improvement., Disp: 9 tablet, Rfl: 5 .  fenofibrate (TRICOR) 145 MG tablet, Take 1 tablet (145 mg total) by mouth daily., Disp: 30 tablet, Rfl: 5 .  FREESTYLE LITE test strip, CHECK BLOOD SUGAR ONCE A DAY., Disp: 100 each, Rfl: 1 .  hydrochlorothiazide (HYDRODIURIL) 25 MG tablet, 1/2 tab by mouth once daily, Disp: 30 tablet, Rfl: 2 .  Insulin Glargine (LANTUS SOLOSTAR) 100 UNIT/ML Solostar Pen, Inject 28 Units into the skin daily at 10 pm. (Patient taking differently: Inject 31 Units into the skin daily at 10 pm. ), Disp: 5 pen, Rfl: 5 .  Insulin Pen Needle (PEN NEEDLES) 32G X 4 MM MISC, Inject 1 each into the skin every evening. DX  E11.8, Disp: 90 each, Rfl: 1 .  Lancets (FREESTYLE) lancets, Use as  directed to check blood sugar twice a day., Disp: 100 each, Rfl: 5 .  metFORMIN (GLUCOPHAGE) 500 MG tablet, TAKE TWO (2) TABLETS BY MOUTH 2 TIMES DAILY, Disp: 120 tablet, Rfl: 5 .  metoprolol succinate (TOPROL-XL) 25 MG 24 hr tablet, TAKE 1/2 TABLET EVERY DAY, Disp: 45 tablet, Rfl: 1 .  olmesartan (BENICAR) 20 MG tablet, Take 0.5 tablets (10 mg total) by mouth daily., Disp: 30 tablet, Rfl: 2 .  Omega-3 Fatty Acids (FISH OIL) 1000 MG CAPS, Take 1 capsule by mouth daily. , Disp: , Rfl:  .  warfarin (COUMADIN) 7.5 MG tablet, Take as directed by coumadin clinic, Disp: 40 tablet, Rfl: 3  Allergies: No Known Allergies  Past Medical History, Surgical history, Social history, and Family History were reviewed and updated.  Review of Systems: As above  Physical Exam:  height is 5\' 10"  (1.778 m) and weight is 224 lb (101.6 kg). His oral temperature is 98 F (36.7 C). His blood pressure is 137/61 and his pulse is 66. His respiration is 16.   Well-developed and well-nourished white woman. Head and exam shows no ocular or oral lesions. He has no palpable cervical or supraclavicular lymph nodes. Lungs are clear bilaterally. Cardiac exam regular rate and rhythm with a systolic click consistent with his mechanical valve. . Abdomen is soft. He  has good bowel sounds. There is no fluid wave. He has a well-healed laparotomy scar in the right upper quadrant. There is no palpable liver or spleen tip. Neck exam shows no tenderness over the spine ribs or hips. Extremities shows no clubbing, cyanosis or edema. He has good range of motion of his joints. Skin exam no rashes.   Lab Results  Component Value Date   WBC 8.0 09/23/2015   HGB 12.5 (L) 09/23/2015   HCT 35.6 (L) 09/23/2015   MCV 90 09/23/2015   PLT 202 09/23/2015     Chemistry      Component Value Date/Time   NA 138 07/14/2015 0822   NA 137 03/25/2015 0955   K 4.5 07/14/2015 0822   K 4.2 03/25/2015 0955   CL 104 07/14/2015 0822   CO2 25 07/14/2015  0822   CO2 22 03/25/2015 0955   BUN 30 (H) 07/14/2015 0822   BUN 26.0 03/25/2015 0955   CREATININE 1.29 07/14/2015 0822   CREATININE 1.2 03/25/2015 0955      Component Value Date/Time   CALCIUM 9.4 07/14/2015 0822   CALCIUM 8.8 03/25/2015 0955   ALKPHOS 64 03/25/2015 0955   AST 36 (H) 03/25/2015 0955   ALT 37 03/25/2015 0955   BILITOT 0.45 03/25/2015 0955         Impression and Plan: [name] is a 27- gentleman with hemochromatosis. He is homozygous for the H63D mutation. He is doing quite well.  He is still able to help take care of his grandson who has special needs. He really enjoys doing this.  I'm sure that his [employer] are still okay. Again he was last phlebotomized back in August 2015.  I will see him back in 6 months.  Volanda Napoleon, MD 8/24/201711:15 AM

## 2015-09-26 ENCOUNTER — Encounter: Payer: Self-pay | Admitting: Family

## 2015-09-30 ENCOUNTER — Encounter: Payer: Self-pay | Admitting: Family

## 2015-10-05 ENCOUNTER — Telehealth: Payer: Self-pay | Admitting: Family

## 2015-10-05 NOTE — Telephone Encounter (Signed)
LM to schedule AWV with RN ° °

## 2015-10-07 ENCOUNTER — Ambulatory Visit (INDEPENDENT_AMBULATORY_CARE_PROVIDER_SITE_OTHER): Payer: Medicare Other | Admitting: *Deleted

## 2015-10-07 ENCOUNTER — Other Ambulatory Visit: Payer: Self-pay | Admitting: Family

## 2015-10-07 DIAGNOSIS — Z954 Presence of other heart-valve replacement: Secondary | ICD-10-CM

## 2015-10-07 DIAGNOSIS — I359 Nonrheumatic aortic valve disorder, unspecified: Secondary | ICD-10-CM | POA: Diagnosis not present

## 2015-10-07 DIAGNOSIS — Z952 Presence of prosthetic heart valve: Secondary | ICD-10-CM

## 2015-10-07 LAB — POCT INR: INR: 2.9

## 2015-10-14 DIAGNOSIS — L570 Actinic keratosis: Secondary | ICD-10-CM | POA: Diagnosis not present

## 2015-10-15 ENCOUNTER — Encounter: Payer: Self-pay | Admitting: Family

## 2015-10-15 ENCOUNTER — Other Ambulatory Visit: Payer: Self-pay | Admitting: Family

## 2015-10-15 ENCOUNTER — Ambulatory Visit (INDEPENDENT_AMBULATORY_CARE_PROVIDER_SITE_OTHER): Payer: Medicare Other | Admitting: Family

## 2015-10-15 VITALS — HR 63 | Temp 98.3°F | Resp 16 | Ht 70.0 in | Wt 225.8 lb

## 2015-10-15 DIAGNOSIS — I10 Essential (primary) hypertension: Secondary | ICD-10-CM

## 2015-10-15 DIAGNOSIS — E118 Type 2 diabetes mellitus with unspecified complications: Secondary | ICD-10-CM | POA: Diagnosis not present

## 2015-10-15 DIAGNOSIS — E119 Type 2 diabetes mellitus without complications: Secondary | ICD-10-CM

## 2015-10-15 DIAGNOSIS — E781 Pure hyperglyceridemia: Secondary | ICD-10-CM

## 2015-10-15 DIAGNOSIS — IMO0002 Reserved for concepts with insufficient information to code with codable children: Secondary | ICD-10-CM

## 2015-10-15 DIAGNOSIS — E1165 Type 2 diabetes mellitus with hyperglycemia: Secondary | ICD-10-CM

## 2015-10-15 DIAGNOSIS — Z23 Encounter for immunization: Secondary | ICD-10-CM | POA: Diagnosis not present

## 2015-10-15 DIAGNOSIS — Z794 Long term (current) use of insulin: Secondary | ICD-10-CM

## 2015-10-15 LAB — BASIC METABOLIC PANEL
BUN: 28 mg/dL — ABNORMAL HIGH (ref 6–23)
CALCIUM: 9.1 mg/dL (ref 8.4–10.5)
CO2: 29 mEq/L (ref 19–32)
Chloride: 102 mEq/L (ref 96–112)
Creatinine, Ser: 1.27 mg/dL (ref 0.40–1.50)
GFR: 59.49 mL/min — ABNORMAL LOW (ref >60.00)
GLUCOSE: 116 mg/dL — AB (ref 70–99)
POTASSIUM: 4.2 meq/L (ref 3.5–5.1)
SODIUM: 138 meq/L (ref 135–145)

## 2015-10-15 LAB — HEMOGLOBIN A1C: Hgb A1c MFr Bld: 7.2 % — ABNORMAL HIGH (ref 4.6–6.5)

## 2015-10-15 MED ORDER — BASAGLAR KWIKPEN 100 UNIT/ML ~~LOC~~ SOPN: 31.0000 [IU] | PEN_INJECTOR | Freq: Every day | SUBCUTANEOUS | 5 refills | Status: DC

## 2015-10-15 MED ORDER — BASAGLAR KWIKPEN 100 UNIT/ML ~~LOC~~ SOPN: 33.0000 [IU] | PEN_INJECTOR | Freq: Every day | SUBCUTANEOUS | 5 refills | Status: DC

## 2015-10-15 MED ORDER — AMOXICILLIN 500 MG PO CAPS
ORAL_CAPSULE | ORAL | 0 refills | Status: DC
Start: 2015-10-15 — End: 2017-05-23

## 2015-10-15 NOTE — Progress Notes (Signed)
Subjective:    Patient ID: [name], male    DOB: [birth year], 71 y.o.   MRN: [id number]  HPI  [name] is a 70 yr old male who presents today for follow up.  1) Dm2- currently maintained on lantus 31 units. His insurance is recommending basaglar due to cost.   Lab Results  Component Value Date   HGBA1C 7.2 (H) 07/14/2015   HGBA1C 7.2 (H) 04/09/2015   HGBA1C 7.3 (H) 01/08/2015   Lab Results  Component Value Date   MICROALBUR 8.7 (H) 09/09/2014   LDLCALC 54 11/25/2010   CREATININE 1.3 09/23/2015   2) Hyperlipidemia-  Pt is maintained on tricor and lipitor.  Lab Results  Component Value Date   CHOL 144 07/15/2015   HDL 30.90 (L) 07/15/2015   LDLCALC 54 11/25/2010   LDLDIRECT 80.0 07/15/2015   TRIG 299.0 (H) 07/15/2015   CHOLHDL 5 07/15/2015   3) HTN- maintianed on hctz, toprol xl, benicar.  BP Readings from Last 3 Encounters:  09/23/15 137/61  07/09/15 126/66  04/09/15 134/63     Review of Systems See HPI  Past Medical History:  Diagnosis Date  . Allergy   . Aortic stenosis   . CAD (coronary artery disease)    cabg  . Cancer [facility]) 2011   prostate  . Erectile dysfunction   . Heart murmur   . Hemochromatosis 02/14/2011  . Hemorrhoids   . History of gout   . History of hepatitis 1974  . Hyperlipidemia   . Hypertension   . Hypertrophic obstructive cardiomyopathy   . Obesity    moderate  . Sensorineural hearing loss   . Type II or unspecified type diabetes mellitus without mention of complication, not stated as uncontrolled      Social History   Social History  . Marital status: Married    Spouse name: N/A  . Number of children: N/A  . Years of education: N/A   Occupational History  . Not on file.   Social History Main Topics  . Smoking status: Former Smoker    Types: Cigars  . Smokeless tobacco: Never Used     Comment: Never used Tobacco  . Alcohol use 0.0 oz/week     Comment: 4 glasses wine a month  . Drug use: Unknown  .  Sexual activity: Not on file   Other Topics Concern  . Not on file   Social History Narrative  . No narrative on file    Past Surgical History:  Procedure Laterality Date  . 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 APPENDECTOMY  1990  . CHOLECYSTECTOMY  1990  . CORONARY ARTERY BYPASS GRAFT  10/2003  . HERNIA REPAIR  1999   right, inguinal  . HERNIA REPAIR  2002   left, inguinal  . HIP SURGERY  2006   right hip  . PILONIDAL CYST EXCISION  1964  . prostate seed implant  3/12  . TONSILLECTOMY  childhood    Family History  Problem Relation Age of Onset  . Heart disease Mother   . Stroke Mother   . Heart disease Father   . Asperger's syndrome Son   . Hyperlipidemia Son   . Coronary artery disease Brother   . Cancer Neg Hx     negative for colon cancer    No Known Allergies  Current Outpatient Prescriptions on File Prior to Visit  Medication Sig Dispense Refill  . atorvastatin (LIPITOR) 20 MG tablet TAKE  ONE (1) TABLET EACH DAY 90 tablet 1  . colchicine 0.6 MG tablet 2 tabs by mouth at start or gout flare, then 1 tab 1 hour later.  May repeat following day if no improvement. 9 tablet 5  . fenofibrate (TRICOR) 145 MG tablet Take 1 tablet (145 mg total) by mouth daily. 30 tablet 5  . FREESTYLE LITE test strip CHECK BLOOD SUGAR ONCE A DAY. 100 each 1  . hydrochlorothiazide (HYDRODIURIL) 25 MG tablet 1/2 tab by mouth once daily 30 tablet 2  . Insulin Pen Needle (PEN NEEDLES) 32G X 4 MM MISC Inject 1 each into the skin every evening. DX  E11.8 90 each 1  . Lancets (FREESTYLE) lancets Use as directed to check blood sugar twice a day. 100 each 5  . metFORMIN (GLUCOPHAGE) 500 MG tablet TAKE TWO (2) TABLETS BY MOUTH 2 TIMES DAILY 120 tablet 0  . metoprolol succinate (TOPROL-XL) 25 MG 24 hr tablet TAKE 1/2 TABLET EVERY DAY 45 tablet 1  . olmesartan (BENICAR) 20 MG tablet Take 0.5 tablets (10 mg total) by mouth daily. 30 tablet 2  . Omega-3 Fatty Acids (FISH OIL) 1000 MG  CAPS Take 1 capsule by mouth daily.     Marland Kitchen warfarin (COUMADIN) 7.5 MG tablet Take as directed by coumadin clinic 40 tablet 3   No current facility-administered medications on file prior to visit.     Pulse 63   Temp 98.3 F (36.8 C) (Oral)   Resp 16   Ht 5\' 10"  (1.778 m)   Wt 225 lb 12.8 oz (102.4 kg)   SpO2 98% Comment: room air  BMI 32.40 kg/m       Objective:   Physical Exam  Constitutional: He is oriented to person, place, and time. He appears well-developed and well-nourished. No distress.  HENT:  Head: Normocephalic and atraumatic.  Cardiovascular: Normal rate and regular rhythm.   No murmur heard. Pulmonary/Chest: Effort normal and breath sounds normal. No respiratory distress. He has no wheezes. He has no rales.  Musculoskeletal:  2+ bilateral pedal edema  Neurological: He is alert and oriented to person, place, and time.  Skin: Skin is warm and dry.  Psychiatric: He has a normal mood and affect. His behavior is normal. Thought content normal.          Assessment & Plan:

## 2015-10-15 NOTE — Assessment & Plan Note (Signed)
LDL is at goal. Trigs still elevated. Continue diet, exercise, tricor/lipitor.

## 2015-10-15 NOTE — Progress Notes (Signed)
Pre visit review using our clinic review tool, if applicable. No additional management support is needed unless otherwise documented below in the visit note. 

## 2015-10-15 NOTE — Assessment & Plan Note (Signed)
Stable on current medications, continue same. Obtain follow up bmet.

## 2015-10-15 NOTE — Patient Instructions (Addendum)
Please complete lab work prior to leaving. When you finish your lantus,please begin basaglar 31 units once daily in place of lantus.

## 2015-10-15 NOTE — Assessment & Plan Note (Signed)
Stable on current medications. Will change lantus to basaglar for cost purposes.  Obtain follow up A1C.

## 2015-10-21 DIAGNOSIS — C61 Malignant neoplasm of prostate: Secondary | ICD-10-CM | POA: Diagnosis not present

## 2015-10-25 ENCOUNTER — Encounter: Payer: Self-pay | Admitting: Pharmacist

## 2015-10-25 ENCOUNTER — Encounter: Payer: Self-pay | Admitting: Family

## 2015-10-26 MED ORDER — METOPROLOL SUCCINATE ER 25 MG PO TB24: 12.5000 mg | ORAL_TABLET | Freq: Every day | ORAL | 5 refills | Status: DC

## 2015-10-26 MED ORDER — FENOFIBRATE 145 MG PO TABS: 145.0000 mg | ORAL_TABLET | Freq: Every day | ORAL | 5 refills | Status: DC

## 2015-10-26 MED ORDER — OLMESARTAN MEDOXOMIL-HCTZ 20-12.5 MG PO TABS: 1.0000 | ORAL_TABLET | Freq: Every day | ORAL | 5 refills | Status: DC

## 2015-10-26 MED ORDER — ATORVASTATIN CALCIUM 20 MG PO TABS: ORAL_TABLET | ORAL | 5 refills | Status: DC

## 2015-10-26 NOTE — Telephone Encounter (Signed)
Please see mychart message. Please send requested refills except coumadin (for 1 month supply with 5 refills). Coumadin needs to go to Dr. Stanford Breed.   D/C olmesartan, d/c hctz. OK to send benicar hct 20/12.5 once daily.  We did foot exam the day he was here and I documented. Not sure why it is still showing incomplete in mychart. (see pt message)

## 2015-10-27 DIAGNOSIS — E291 Testicular hypofunction: Secondary | ICD-10-CM | POA: Diagnosis not present

## 2015-10-27 DIAGNOSIS — C61 Malignant neoplasm of prostate: Secondary | ICD-10-CM | POA: Diagnosis not present

## 2015-11-09 ENCOUNTER — Encounter: Payer: Self-pay | Admitting: Family

## 2015-11-09 ENCOUNTER — Ambulatory Visit (INDEPENDENT_AMBULATORY_CARE_PROVIDER_SITE_OTHER): Payer: Medicare Other | Admitting: *Deleted

## 2015-11-09 ENCOUNTER — Other Ambulatory Visit: Payer: Self-pay | Admitting: Family

## 2015-11-09 DIAGNOSIS — Z952 Presence of prosthetic heart valve: Secondary | ICD-10-CM

## 2015-11-09 DIAGNOSIS — I359 Nonrheumatic aortic valve disorder, unspecified: Secondary | ICD-10-CM | POA: Diagnosis not present

## 2015-11-09 LAB — POCT INR: INR: 1.9

## 2015-11-30 ENCOUNTER — Encounter: Payer: Self-pay | Admitting: Family

## 2015-12-03 ENCOUNTER — Ambulatory Visit (INDEPENDENT_AMBULATORY_CARE_PROVIDER_SITE_OTHER): Payer: Medicare Other | Admitting: Pharmacist

## 2015-12-03 DIAGNOSIS — I359 Nonrheumatic aortic valve disorder, unspecified: Secondary | ICD-10-CM | POA: Diagnosis not present

## 2015-12-03 DIAGNOSIS — Z952 Presence of prosthetic heart valve: Secondary | ICD-10-CM | POA: Diagnosis not present

## 2015-12-03 LAB — POCT INR: INR: 2.3

## 2015-12-08 ENCOUNTER — Other Ambulatory Visit: Payer: Self-pay | Admitting: Family

## 2015-12-10 ENCOUNTER — Encounter: Payer: Self-pay | Admitting: Family

## 2015-12-13 ENCOUNTER — Other Ambulatory Visit: Payer: Self-pay | Admitting: Family

## 2015-12-13 MED ORDER — FENOFIBRATE 145 MG PO TABS: 145.0000 mg | ORAL_TABLET | Freq: Every day | ORAL | 5 refills | Status: DC

## 2015-12-13 MED ORDER — OLMESARTAN MEDOXOMIL-HCTZ 20-12.5 MG PO TABS: 1.0000 | ORAL_TABLET | Freq: Every day | ORAL | 5 refills | Status: DC

## 2015-12-13 MED ORDER — METOPROLOL SUCCINATE ER 25 MG PO TB24: 12.5000 mg | ORAL_TABLET | Freq: Every day | ORAL | 6 refills | Status: DC

## 2015-12-17 ENCOUNTER — Other Ambulatory Visit: Payer: Self-pay | Admitting: Family

## 2015-12-17 ENCOUNTER — Telehealth: Payer: Self-pay | Admitting: Family

## 2015-12-17 NOTE — Telephone Encounter (Signed)
Spoke with pharmacist, Karle Starch and verified that [employer] [facility] was sent on 10/15/15 at pt's request due to cost. They have also requested warfarin refill from cardiology. Attempted to notify pt by phone and call disconnected with a fax tone. Sent mychart message to pt.

## 2015-12-17 NOTE — Telephone Encounter (Signed)
Patient is calling stating that the pharmacy told him that there are no refills on his insulin and he said they stated a few other medications but he doesn't remember which ones. Please advise.   Pharmacy: [facility], [place] - [id number]  Patient phone: [phone]

## 2015-12-20 ENCOUNTER — Telehealth: Payer: Self-pay | Admitting: Family

## 2015-12-20 ENCOUNTER — Telehealth: Payer: Self-pay | Admitting: Cardiology

## 2015-12-20 ENCOUNTER — Telehealth: Payer: Self-pay | Admitting: *Deleted

## 2015-12-20 ENCOUNTER — Other Ambulatory Visit: Payer: Self-pay | Admitting: *Deleted

## 2015-12-20 ENCOUNTER — Encounter: Payer: Self-pay | Admitting: Family

## 2015-12-20 MED ORDER — WARFARIN SODIUM 7.5 MG PO TABS: ORAL_TABLET | ORAL | 3 refills | Status: DC

## 2015-12-20 NOTE — Telephone Encounter (Signed)
Spoke with [facility] and was told that [name] is ready for pick up for pt. Thinks pt misunderstood and it was Lantus that was denied. Notified pt's wife and she voices understanding.

## 2015-12-20 NOTE — Telephone Encounter (Signed)
°  Relation to [id number] Call back number:[phone] [facility]   Reason for call: pt states that his insulin pens was denied and he is on his last pen, pt is going out of town on Wednesday morning will need his insulin prior to leaving please call in asap

## 2015-12-20 NOTE — Telephone Encounter (Signed)
Okay with me 

## 2015-12-20 NOTE — Telephone Encounter (Signed)
Ok with me [name]  

## 2015-12-20 NOTE — Telephone Encounter (Signed)
New Message  Pt voiced wanting to be released from [facility] to be accepted by [facility].  Pt voiced wanting something at the [address] location instead of going anywhere else.  Please f/u

## 2015-12-21 ENCOUNTER — Other Ambulatory Visit: Payer: Self-pay | Admitting: *Deleted

## 2015-12-21 MED ORDER — WARFARIN SODIUM 7.5 MG PO TABS: ORAL_TABLET | ORAL | 3 refills | Status: DC

## 2015-12-30 ENCOUNTER — Encounter: Payer: Self-pay | Admitting: Family

## 2015-12-31 ENCOUNTER — Other Ambulatory Visit: Payer: Self-pay | Admitting: Family

## 2015-12-31 ENCOUNTER — Telehealth: Payer: Self-pay | Admitting: *Deleted

## 2015-12-31 NOTE — Telephone Encounter (Signed)
Refill sent per [facility] refill protocol/SLS  

## 2015-12-31 NOTE — Telephone Encounter (Signed)
Called patient and left message to return call

## 2016-01-03 ENCOUNTER — Other Ambulatory Visit: Payer: Self-pay | Admitting: Family

## 2016-01-03 NOTE — Telephone Encounter (Signed)
Refill sent per [facility] refill protocol/SLS  

## 2016-01-04 ENCOUNTER — Ambulatory Visit (INDEPENDENT_AMBULATORY_CARE_PROVIDER_SITE_OTHER): Payer: Medicare Other | Admitting: Pharmacist

## 2016-01-04 DIAGNOSIS — Z952 Presence of prosthetic heart valve: Secondary | ICD-10-CM | POA: Diagnosis not present

## 2016-01-04 DIAGNOSIS — I359 Nonrheumatic aortic valve disorder, unspecified: Secondary | ICD-10-CM | POA: Diagnosis not present

## 2016-01-04 LAB — POCT INR: INR: 2.2

## 2016-01-11 ENCOUNTER — Ambulatory Visit (INDEPENDENT_AMBULATORY_CARE_PROVIDER_SITE_OTHER): Payer: Medicare Other | Admitting: Family

## 2016-01-11 ENCOUNTER — Encounter: Payer: Self-pay | Admitting: Family

## 2016-01-11 VITALS — BP 126/64 | HR 79 | Temp 97.8°F | Resp 16 | Ht 70.0 in | Wt 223.2 lb

## 2016-01-11 DIAGNOSIS — I10 Essential (primary) hypertension: Secondary | ICD-10-CM | POA: Diagnosis not present

## 2016-01-11 DIAGNOSIS — E785 Hyperlipidemia, unspecified: Secondary | ICD-10-CM | POA: Diagnosis not present

## 2016-01-11 DIAGNOSIS — IMO0002 Reserved for concepts with insufficient information to code with codable children: Secondary | ICD-10-CM

## 2016-01-11 DIAGNOSIS — E1129 Type 2 diabetes mellitus with other diabetic kidney complication: Secondary | ICD-10-CM

## 2016-01-11 DIAGNOSIS — E118 Type 2 diabetes mellitus with unspecified complications: Secondary | ICD-10-CM

## 2016-01-11 DIAGNOSIS — Z794 Long term (current) use of insulin: Secondary | ICD-10-CM

## 2016-01-11 DIAGNOSIS — E1165 Type 2 diabetes mellitus with hyperglycemia: Secondary | ICD-10-CM

## 2016-01-11 DIAGNOSIS — R809 Proteinuria, unspecified: Secondary | ICD-10-CM

## 2016-01-11 NOTE — Assessment & Plan Note (Signed)
Obtain follow up lipid panel next week when he returns fasting.

## 2016-01-11 NOTE — Progress Notes (Signed)
Pre visit review using our clinic review tool, if applicable. No additional management support is needed unless otherwise documented below in the visit note. 

## 2016-01-11 NOTE — Progress Notes (Signed)
Subjective:    Patient ID: [name], male    DOB: 06/02/1945, 70 y.o.   MRN: [id number]  HPI  [name] is a 70 yr old male who presents today for follow up.  1) DM2- last visit his lantus was changed to [employer] for insurance purposes. H eis pleased that the basaglar is free to him. Home sugars have been stable.  He is also maintained on metformin. Reports home sugars have been <130 Lab Results  Component Value Date   HGBA1C 7.2 (H) 10/15/2015   HGBA1C 7.2 (H) 07/14/2015   HGBA1C 7.2 (H) 04/09/2015   Lab Results  Component Value Date   MICROALBUR 8.7 (H) 09/09/2014   LDLCALC 54 11/25/2010   CREATININE 1.27 10/15/2015   2) HTN- maintained on benicar hct BP Readings from Last 3 Encounters:  01/11/16 126/64  09/23/15 137/61  07/09/15 126/66   3)Hyperlipidemia - maintained on tricor, lipitor. Continues to work on diet.   Lab Results  Component Value Date   CHOL 144 07/15/2015   HDL 30.90 (L) 07/15/2015   LDLCALC 54 11/25/2010   LDLDIRECT 80.0 07/15/2015   TRIG 299.0 (H) 07/15/2015   CHOLHDL 5 07/15/2015        Review of Systems See HPI  Past Medical History:  Diagnosis Date  . Allergy   . Aortic stenosis   . CAD (coronary artery disease)    cabg  . Cancer [facility]) 2011   prostate  . Erectile dysfunction   . Heart murmur   . Hemochromatosis 02/14/2011  . Hemorrhoids   . History of gout   . History of hepatitis 1974  . Hyperlipidemia   . Hypertension   . Hypertr obst cardiomyop   . Obesity    moderate  . Sensorineural hearing loss   . Type II or unspecified type diabetes mellitus without mention of complication, not stated as uncontrolled      Social History   Social History  . Marital status: Married    Spouse name: N/A  . Number of children: N/A  . Years of education: N/A   Occupational History  . Not on file.   Social History Main Topics  . Smoking status: Former Smoker    Types: Cigars  . Smokeless tobacco: Never Used   Comment: Never used Tobacco  . Alcohol use 0.0 oz/week     Comment: 4 glasses wine a month  . Drug use: Unknown  . Sexual activity: Not on file   Other Topics Concern  . Not on file   Social History Narrative  . No narrative on file    Past Surgical History:  Procedure Laterality Date  . 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 APPENDECTOMY  1990  . CHOLECYSTECTOMY  1990  . CORONARY ARTERY BYPASS GRAFT  10/2003  . HERNIA REPAIR  1999   right, inguinal  . HERNIA REPAIR  2002   left, inguinal  . HIP SURGERY  2006   right hip  . PILONIDAL CYST EXCISION  1964  . prostate seed implant  3/12  . TONSILLECTOMY  childhood    Family History  Problem Relation Age of Onset  . Heart disease Mother   . Stroke Mother   . Heart disease Father   . Asperger's syndrome Son   . Hyperlipidemia Son   . Coronary artery disease Brother   . Cancer Neg Hx     negative for colon cancer    No Known Allergies  Current Outpatient Prescriptions on File Prior to Visit  Medication Sig Dispense Refill  . amoxicillin (AMOXIL) 500 MG capsule 4 tabs by mouth prior to dental procedure. 4 capsule 0  . atorvastatin (LIPITOR) 20 MG tablet TAKE ONE (1) TABLET BY MOUTH EVERY DAY 90 tablet 0  . colchicine 0.6 MG tablet 2 tabs by mouth at start or gout flare, then 1 tab 1 hour later.  May repeat following day if no improvement. 9 tablet 5  . fenofibrate (TRICOR) 145 MG tablet Take 1 tablet (145 mg total) by mouth daily. 30 tablet 5  . glucose blood (FREESTYLE LITE) test strip Dx: E11.9 100 each 5  . Insulin Glargine (BASAGLAR KWIKPEN) 100 UNIT/ML SOPN Inject 0.33 mLs (33 Units total) into the skin at bedtime. 5 pen 5  . Lancets (FREESTYLE) lancets Use as directed to check blood sugar twice a day. 100 each 5  . metFORMIN (GLUCOPHAGE) 500 MG tablet TAKE TWO (2) TABLETS BY MOUTH 2 TIMES DAILY 120 tablet 1  . metoprolol succinate (TOPROL-XL) 25 MG 24 hr tablet Take 0.5 tablets (12.5 mg total) by mouth  daily. 30 tablet 6  . NOVOFINE PLUS 32G X 4 MM MISC USE FOR INJECTIONS INTO THE SKIN EVERY EVENING 100 each 1  . olmesartan-hydrochlorothiazide (BENICAR HCT) 20-12.5 MG tablet Take 1 tablet by mouth daily. 30 tablet 5  . Omega-3 Fatty Acids (FISH OIL) 1000 MG CAPS Take 1 capsule by mouth daily.     Marland Kitchen warfarin (COUMADIN) 7.5 MG tablet Take as directed by coumadin clinic 40 tablet 3   No current facility-administered medications on file prior to visit.     BP 126/64 (BP Location: Right Arm, Cuff Size: Normal)   Pulse 79   Temp 97.8 F (36.6 C) (Oral)   Resp 16   Ht 5\' 10"  (1.778 m)   Wt 223 lb 3.2 oz (101.2 kg)   SpO2 98%   BMI 32.03 kg/m       Objective:   Physical Exam  Constitutional: He is oriented to person, place, and time. He appears well-developed and well-nourished. No distress.  HENT:  Head: Normocephalic and atraumatic.  Cardiovascular: Normal rate and regular rhythm.   No murmur heard. "click" noted from his mechanical valve  Pulmonary/Chest: Effort normal and breath sounds normal. No respiratory distress. He has no wheezes. He has no rales.  Musculoskeletal: He exhibits no edema.  Neurological: He is alert and oriented to person, place, and time.  Skin: Skin is warm and dry.  Psychiatric: He has a normal mood and affect. His behavior is normal. Thought content normal.          Assessment & Plan:

## 2016-01-11 NOTE — Assessment & Plan Note (Signed)
Blood sugars are stable at home. Continue medications at current dose. He will return next week for A1C.

## 2016-01-11 NOTE — Assessment & Plan Note (Signed)
bp is stable on current medications. Continue same.

## 2016-01-18 NOTE — Progress Notes (Signed)
Pre visit review using our clinic review tool, if applicable. No additional management support is needed unless otherwise documented below in the visit note. 

## 2016-01-18 NOTE — Progress Notes (Signed)
Subjective:   [name] is a 70 y.o. male who presents for Medicare Annual/Subsequent preventive examination.  Review of Systems:  No ROS.  Medicare Wellness Visit.  Cardiac Risk Factors include: advanced age (>78men, >46 women);dyslipidemia;hypertension;male gender Sleep patterns: no sleep issues and falls asleep easily.  Sleeps 7-9 hrs. Feels rested most of the time. Home Safety/Smoke Alarms:  Smoke detectors in place.  Living environment; residence and Firearm Safety: Lives at home with wife and son. No firearms . Feels safe.  Seat Belt Safety/Bike Helmet: Wears seatbelt.   Counseling:   Eye Exam- Follows with [facility] annually. Wears reading glasses only.  Dental- Dr. Theda Belfast every 6 months.   Male:   CCS- Last on 10/05/09. Internal and external hemorrhoids. Otherwise normal. F/u 43yrs per report. PSA-   Lab Results  Component Value Date   PSA 13.88 (H) 06/16/2009        Objective:    Vitals: BP 132/64 (BP Location: Right Arm, Patient Position: Sitting, Cuff Size: Normal)   Pulse 66   Ht 5\' 10"  (1.778 m)   Wt 227 lb 6.4 oz (103.1 kg)   SpO2 99%   BMI 32.63 kg/m   Body mass index is 32.63 kg/m.  Tobacco History  Smoking Status  . Former Smoker  . Types: Cigars  Smokeless Tobacco  . Never Used    Comment: Never used Tobacco     Counseling given: No   Past Medical History:  Diagnosis Date  . Allergy   . Aortic stenosis   . CAD (coronary artery disease)    cabg  . Cancer [facility]) 2011   prostate  . Erectile dysfunction   . Heart murmur   . Hemochromatosis 02/14/2011  . Hemorrhoids   . History of gout   . History of hepatitis 1974  . Hyperlipidemia   . Hypertension   . Hypertr obst cardiomyop   . Obesity    moderate  . Sensorineural hearing loss   . Type II or unspecified type diabetes mellitus without mention of complication, not stated as uncontrolled    Past Surgical History:  Procedure Laterality Date  . AORTIC VALVE REPLACEMENT     [employer]  . APPENDECTOMY  1990  . CHOLECYSTECTOMY  1990  . CORONARY ARTERY BYPASS GRAFT  10/2003  . HERNIA REPAIR  1999   right, inguinal  . HERNIA REPAIR  2002   left, inguinal  . HIP SURGERY  2006   right hip  . PILONIDAL CYST EXCISION  1964  . prostate seed implant  3/12  . TONSILLECTOMY  childhood   Family History  Problem Relation Age of Onset  . Heart disease Mother   . Stroke Mother   . Heart disease Father   . Asperger's syndrome Son   . Hyperlipidemia Son   . Coronary artery disease Brother   . Cancer Neg Hx     negative for colon cancer   History  Sexual Activity  . Sexual activity: No    Outpatient Encounter Prescriptions as of 01/19/2016  Medication Sig  . amoxicillin (AMOXIL) 500 MG capsule 4 tabs by mouth prior to dental procedure.  Marland Kitchen atorvastatin (LIPITOR) 20 MG tablet TAKE ONE (1) TABLET BY MOUTH EVERY DAY  . colchicine 0.6 MG tablet 2 tabs by mouth at start or gout flare, then 1 tab 1 hour later.  May repeat following day if no improvement.  . fenofibrate (TRICOR) 145 MG tablet Take 1 tablet (145 mg total) by mouth  daily.  . glucose blood (FREESTYLE LITE) test strip Dx: E11.9  . Insulin Glargine (BASAGLAR KWIKPEN) 100 UNIT/ML SOPN Inject 0.33 mLs (33 Units total) into the skin at bedtime.  . Lancets (FREESTYLE) lancets Use as directed to check blood sugar twice a day.  . metFORMIN (GLUCOPHAGE) 500 MG tablet TAKE TWO (2) TABLETS BY MOUTH 2 TIMES DAILY  . metoprolol succinate (TOPROL-XL) 25 MG 24 hr tablet Take 0.5 tablets (12.5 mg total) by mouth daily.  Marland Kitchen NOVOFINE PLUS 32G X 4 MM MISC USE FOR INJECTIONS INTO THE SKIN EVERY EVENING  . olmesartan-hydrochlorothiazide (BENICAR HCT) 20-12.5 MG tablet Take 1 tablet by mouth daily.  . Omega-3 Fatty Acids (FISH OIL) 1000 MG CAPS Take 1 capsule by mouth daily.   Marland Kitchen warfarin (COUMADIN) 7.5 MG tablet Take as directed by coumadin clinic   No facility-administered encounter medications on file as of 01/19/2016.      Activities of Daily Living In your present state of health, do you have any difficulty performing the following activities: 01/19/2016  Hearing? Y  Vision? Y  Difficulty concentrating or making decisions? Y  Walking or climbing stairs? N  Dressing or bathing? N  Doing errands, shopping? N  Preparing Food and eating ? N  Using the Toilet? N  In the past six months, have you accidently leaked urine? N  Do you have problems with loss of bowel control? N  Managing your Medications? N  Managing your Finances? N  Housekeeping or managing your Housekeeping? N  Some recent data might be hidden    Patient Care Team: [name], NP as PCP - General Franchot Gallo, MD as Consulting Physician (Urology) Calvert Cantor, MD as Consulting Physician (Ophthalmology) Volanda Napoleon, MD as Consulting Physician (Oncology) Lelon Perla, MD as Consulting Physician (Cardiology)   Assessment:    Physical assessment deferred to PCP.  Exercise Activities and Dietary recommendations Current Exercise Habits: Structured exercise class, Type of exercise: strength training/weights;stretching, Time (Minutes): 30, Frequency (Times/Week): 3, Weekly Exercise (Minutes/Week): 90, Intensity: Mild  Diet 24 HOUR RECALL: Breakfast: Cereal, hot tea Lunch: Kabobs and salad Dinner: Chicken, [place], or beef. Drinks about 6-7 glasses of water per day.       Goals    . HEMOGLOBIN A1C < 7.0          Lantus increased to 31 units nightly 07/18/2015.    . Increase physical activity          As tolerated.      Fall Risk Fall Risk  01/19/2016 01/11/2016 09/23/2015 03/25/2015 12/10/2014  Falls in the past year? No No No No No   Depression Screen PHQ 2/9 Scores 01/11/2016 12/10/2014 11/28/2013 08/13/2013  PHQ - 2 Score 0 0 0 0  Exception Documentation - Patient refusal - -    Cognitive Function MMSE - Mini Mental State Exam 01/19/2016  Orientation to time 5  Orientation to Place 5  Registration  3  Attention/ Calculation 5  Recall 2  Language- name 2 objects 2  Language- repeat 1  Language- follow 3 step command 3  Language- read & follow direction 1  Write a sentence 1  Copy design 1  Total score 29        Immunization History  Administered Date(s) Administered  . Influenza Split 11/25/2010, 11/21/2011  . Influenza Whole 11/30/2008, 10/13/2009  . Influenza,inj,Quad PF,36+ Mos 12/06/2012, 09/30/2013, 10/01/2014, 10/15/2015  . Pneumococcal Conjugate-13 02/12/2013  . Pneumococcal Polysaccharide-23 05/19/2011  . Td 06/01/2000  . Tdap 03/01/2012  .  Zoster 06/13/2010   Screening Tests Health Maintenance  Topic Date Due  . HEMOGLOBIN A1C  04/13/2016  . OPHTHALMOLOGY EXAM  05/04/2016  . FOOT EXAM  10/14/2016  . COLONOSCOPY  10/06/2019  . TETANUS/TDAP  03/01/2022  . INFLUENZA VACCINE  Completed  . ZOSTAVAX  Completed  . Hepatitis C Screening  Completed  . PNA vac Low Risk Adult  Completed      Plan:      Bring a copy of your advance directives to your next office visit. Continue to eat heart healthy diet (full of fruits, vegetables, whole grains, lean protein, water--limit salt, fat, and sugar intake) and increase physical activity as tolerated.  During the course of the visit the patient was educated and counseled about the following appropriate screening and preventive services:   Vaccines to include Pneumoccal, Influenza, Hepatitis B, Td, Zostavax, HCV  Cardiovascular Disease  Colorectal cancer screening  Diabetes screening  Prostate Cancer Screening  Glaucoma screening  Nutrition counseling    Patient Instructions (the written plan) was given to the patient.    Shela Nevin, [place]  01/19/2016

## 2016-01-19 ENCOUNTER — Encounter: Payer: Self-pay | Admitting: *Deleted

## 2016-01-19 ENCOUNTER — Other Ambulatory Visit (INDEPENDENT_AMBULATORY_CARE_PROVIDER_SITE_OTHER): Payer: Medicare Other

## 2016-01-19 ENCOUNTER — Ambulatory Visit (INDEPENDENT_AMBULATORY_CARE_PROVIDER_SITE_OTHER): Payer: Medicare Other | Admitting: *Deleted

## 2016-01-19 VITALS — BP 132/64 | HR 66 | Ht 70.0 in | Wt 227.4 lb

## 2016-01-19 DIAGNOSIS — E785 Hyperlipidemia, unspecified: Secondary | ICD-10-CM

## 2016-01-19 DIAGNOSIS — E1129 Type 2 diabetes mellitus with other diabetic kidney complication: Secondary | ICD-10-CM | POA: Diagnosis not present

## 2016-01-19 DIAGNOSIS — Z0000 Encounter for general adult medical examination without abnormal findings: Secondary | ICD-10-CM

## 2016-01-19 DIAGNOSIS — R809 Proteinuria, unspecified: Secondary | ICD-10-CM

## 2016-01-19 LAB — LIPID PANEL
CHOLESTEROL: 129 mg/dL (ref 0–200)
HDL: 28.9 mg/dL — AB (ref >39.00)
NonHDL: 100.57
TRIGLYCERIDES: 269 mg/dL — AB (ref 0.0–149.0)
Total CHOL/HDL Ratio: 4
VLDL: 53.8 mg/dL — AB (ref 0.0–40.0)

## 2016-01-19 LAB — BASIC METABOLIC PANEL
BUN: 23 mg/dL (ref 6–23)
CHLORIDE: 104 meq/L (ref 96–112)
CO2: 27 mEq/L (ref 19–32)
Calcium: 9.3 mg/dL (ref 8.4–10.5)
Creatinine, Ser: 1.3 mg/dL (ref 0.40–1.50)
GFR: 57.87 mL/min — AB (ref >60.00)
Glucose, Bld: 130 mg/dL — ABNORMAL HIGH (ref 70–99)
POTASSIUM: 4.7 meq/L (ref 3.5–5.1)
SODIUM: 140 meq/L (ref 135–145)

## 2016-01-19 LAB — HEMOGLOBIN A1C: HEMOGLOBIN A1C: 7.3 % — AB (ref 4.6–6.5)

## 2016-01-19 LAB — LDL CHOLESTEROL, DIRECT: LDL DIRECT: 67 mg/dL

## 2016-01-19 NOTE — Progress Notes (Signed)
Noted and agree. 

## 2016-01-19 NOTE — Patient Instructions (Signed)
Bring a copy of your advance directives to your next office visit. Continue to eat heart healthy diet (full of fruits, vegetables, whole grains, lean protein, water--limit salt, fat, and sugar intake) and increase physical activity as tolerated.

## 2016-01-21 ENCOUNTER — Telehealth: Payer: Self-pay | Admitting: Family

## 2016-01-21 NOTE — Telephone Encounter (Signed)
Patient notified of results and increase of medication

## 2016-01-21 NOTE — Telephone Encounter (Signed)
Sugar is slightly above goal. Increase basaglar from 33 to 36 units once daily.  Triglycerides are mildly elevated. Please work on avoiding concentrated sweets, and limiting white carbs (rice/bread/pasta/potatoes). Instead substitute whole grain versions with reasonable portions.

## 2016-01-22 ENCOUNTER — Encounter: Payer: Self-pay | Admitting: Family

## 2016-01-30 ENCOUNTER — Encounter: Payer: Self-pay | Admitting: Family

## 2016-02-01 ENCOUNTER — Encounter: Payer: Self-pay | Admitting: Family

## 2016-02-10 ENCOUNTER — Other Ambulatory Visit: Payer: Self-pay | Admitting: Family

## 2016-02-11 ENCOUNTER — Encounter: Payer: Self-pay | Admitting: Family

## 2016-02-15 ENCOUNTER — Ambulatory Visit (INDEPENDENT_AMBULATORY_CARE_PROVIDER_SITE_OTHER): Payer: Medicare Other | Admitting: *Deleted

## 2016-02-15 DIAGNOSIS — Z952 Presence of prosthetic heart valve: Secondary | ICD-10-CM

## 2016-02-15 DIAGNOSIS — I359 Nonrheumatic aortic valve disorder, unspecified: Secondary | ICD-10-CM | POA: Diagnosis not present

## 2016-02-15 LAB — POCT INR: INR: 2.4

## 2016-03-01 ENCOUNTER — Encounter: Payer: Self-pay | Admitting: Family

## 2016-03-01 NOTE — Telephone Encounter (Signed)
This is melissas pt  

## 2016-03-02 DIAGNOSIS — H01001 Unspecified blepharitis right upper eyelid: Secondary | ICD-10-CM | POA: Diagnosis not present

## 2016-03-02 DIAGNOSIS — H524 Presbyopia: Secondary | ICD-10-CM | POA: Diagnosis not present

## 2016-03-02 DIAGNOSIS — H01004 Unspecified blepharitis left upper eyelid: Secondary | ICD-10-CM | POA: Diagnosis not present

## 2016-03-02 DIAGNOSIS — H2513 Age-related nuclear cataract, bilateral: Secondary | ICD-10-CM | POA: Diagnosis not present

## 2016-03-02 DIAGNOSIS — H52223 Regular astigmatism, bilateral: Secondary | ICD-10-CM | POA: Diagnosis not present

## 2016-03-02 DIAGNOSIS — G43809 Other migraine, not intractable, without status migrainosus: Secondary | ICD-10-CM | POA: Diagnosis not present

## 2016-03-06 ENCOUNTER — Telehealth: Payer: Self-pay | Admitting: *Deleted

## 2016-03-06 NOTE — Telephone Encounter (Signed)
Pt called & stated he took an extra 1/2 tab of Coumadin on 03/05/16 therefore advised pt to only take 1/2 tablet today-25/18 instead of 1 tablet.  Advised pt to call back with any others issues & he verbalized understanding.

## 2016-03-09 ENCOUNTER — Encounter: Payer: Self-pay | Admitting: Family

## 2016-03-29 ENCOUNTER — Encounter: Payer: Self-pay | Admitting: Family

## 2016-03-29 ENCOUNTER — Telehealth: Payer: Self-pay | Admitting: *Deleted

## 2016-03-29 MED ORDER — ATORVASTATIN CALCIUM 20 MG PO TABS: ORAL_TABLET | ORAL | 1 refills | Status: DC

## 2016-03-29 NOTE — Telephone Encounter (Signed)
Received fax from [facility] requesting atorvastatin 20mg . Refill sent.

## 2016-03-30 ENCOUNTER — Other Ambulatory Visit: Payer: Medicare Other

## 2016-03-30 ENCOUNTER — Ambulatory Visit (HOSPITAL_BASED_OUTPATIENT_CLINIC_OR_DEPARTMENT_OTHER): Payer: Medicare Other | Admitting: Hematology & Oncology

## 2016-03-30 ENCOUNTER — Other Ambulatory Visit (HOSPITAL_BASED_OUTPATIENT_CLINIC_OR_DEPARTMENT_OTHER): Payer: Medicare Other

## 2016-03-30 ENCOUNTER — Ambulatory Visit: Payer: Medicare Other | Admitting: Hematology & Oncology

## 2016-03-30 DIAGNOSIS — Z8546 Personal history of malignant neoplasm of prostate: Secondary | ICD-10-CM

## 2016-03-30 LAB — COMPREHENSIVE METABOLIC PANEL (CC13)
A/G RATIO: 1.6 (ref 1.2–2.2)
ALBUMIN: 4.8 g/dL (ref 3.5–4.8)
ALT: 29 IU/L (ref 0–44)
AST (SGOT): 40 IU/L (ref 0–40)
Alkaline Phosphatase, S: 45 IU/L (ref 39–117)
BUN / CREAT RATIO: 24 (ref 10–24)
BUN: 31 mg/dL — ABNORMAL HIGH (ref 8–27)
Bilirubin Total: 0.3 mg/dL (ref 0.0–1.2)
CALCIUM: 10.1 mg/dL (ref 8.6–10.2)
CHLORIDE: 99 mmol/L (ref 96–106)
Carbon Dioxide, Total: 25 mmol/L (ref 18–29)
Creatinine, Ser: 1.3 mg/dL — ABNORMAL HIGH (ref 0.76–1.27)
GFR, EST AFRICAN AMERICAN: 63 mL/min/{1.73_m2} (ref >59)
GFR, EST NON AFRICAN AMERICAN: 55 mL/min/{1.73_m2} — AB (ref >59)
GLOBULIN, TOTAL: 3 g/dL (ref 1.5–4.5)
Glucose: 161 mg/dL — ABNORMAL HIGH (ref 65–99)
Potassium, Ser: 4.3 mmol/L (ref 3.5–5.2)
SODIUM: 133 mmol/L — AB (ref 134–144)
Total Protein: 7.8 g/dL (ref 6.0–8.5)

## 2016-03-30 LAB — CBC WITH DIFFERENTIAL (CANCER CENTER ONLY)
BASO#: 0.1 10*3/uL (ref 0.0–0.2)
BASO%: 0.7 % (ref 0.0–2.0)
EOS%: 2.9 % (ref 0.0–7.0)
Eosinophils Absolute: 0.3 10*3/uL (ref 0.0–0.5)
HEMATOCRIT: 40 % (ref 38.7–49.9)
HGB: 14.1 g/dL (ref 13.0–17.1)
LYMPH#: 2 10*3/uL (ref 0.9–3.3)
LYMPH%: 19.8 % (ref 14.0–48.0)
MCH: 31.3 pg (ref 28.0–33.4)
MCHC: 35.3 g/dL (ref 32.0–35.9)
MCV: 89 fL (ref 82–98)
MONO#: 0.7 10*3/uL (ref 0.1–0.9)
MONO%: 7 % (ref 0.0–13.0)
NEUT#: 6.9 10*3/uL — ABNORMAL HIGH (ref 1.5–6.5)
NEUT%: 69.6 % (ref 40.0–80.0)
PLATELETS: 285 10*3/uL (ref 145–400)
RBC: 4.51 10*6/uL (ref 4.20–5.70)
RDW: 12.9 % (ref 11.1–15.7)
WBC: 10 10*3/uL (ref 4.0–10.0)

## 2016-03-30 NOTE — Progress Notes (Signed)
Hematology and Oncology Follow Up Visit  [name] [id number] 1945/05/22 71 y.o. 03/30/2016   Principle Diagnosis:  1. Hemochromatosis (H63D homozygous mutation). 2. Stage I prostate cancer, status post radiation therapy and     radiation seeds.  Current Therapy:    Phlebotomy to maintain a ferritin less than 100      Interim History:  [name] is back for followup. He is doing well. He's had no problems since we last saw him. He had no problems over the holidays.  Back in August, his ferritin was 65 with iron saturation of 18%.  We have not phlebotomized him probably for over 2 years.  He is watch his blood sugars. He is watching blood pressure. He does have some chronic edema in his legs.  He's had no issues with fever. He's had no cough. He's had no change in bowel or bladder habits. He does have a history of prostate cancer. He does not see his urologist exam yearly.  Overall, his performance status is ECOG 1.    Medications:  Current Outpatient Prescriptions:  .  amoxicillin (AMOXIL) 500 MG capsule, 4 tabs by mouth prior to dental procedure., Disp: 4 capsule, Rfl: 0 .  atorvastatin (LIPITOR) 20 MG tablet, TAKE ONE (1) TABLET BY MOUTH EVERY DAY, Disp: 90 tablet, Rfl: 1 .  colchicine 0.6 MG tablet, 2 tabs by mouth at start or gout flare, then 1 tab 1 hour later.  May repeat following day if no improvement., Disp: 9 tablet, Rfl: 5 .  fenofibrate (TRICOR) 145 MG tablet, Take 1 tablet (145 mg total) by mouth daily., Disp: 30 tablet, Rfl: 5 .  glucose blood (FREESTYLE LITE) test strip, Dx: E11.9, Disp: 100 each, Rfl: 5 .  Insulin Glargine (BASAGLAR KWIKPEN) 100 UNIT/ML SOPN, Inject 0.33 mLs (33 Units total) into the skin at bedtime., Disp: 5 pen, Rfl: 5 .  Lancets (FREESTYLE) lancets, Use as directed to check blood sugar twice a day., Disp: 100 each, Rfl: 5 .  metFORMIN (GLUCOPHAGE) 500 MG tablet, TAKE TWO TABLETS BY MOUTH TWICE DAILY, Disp: 120 tablet, Rfl: 5 .   metoprolol succinate (TOPROL-XL) 25 MG 24 hr tablet, Take 0.5 tablets (12.5 mg total) by mouth daily., Disp: 30 tablet, Rfl: 6 .  NOVOFINE PLUS 32G X 4 MM MISC, USE FOR INJECTIONS INTO THE SKIN EVERY EVENING, Disp: 100 each, Rfl: 1 .  olmesartan-hydrochlorothiazide (BENICAR HCT) 20-12.5 MG tablet, Take 1 tablet by mouth daily., Disp: 30 tablet, Rfl: 5 .  Omega-3 Fatty Acids (FISH OIL) 1000 MG CAPS, Take 1 capsule by mouth daily. , Disp: , Rfl:  .  warfarin (COUMADIN) 7.5 MG tablet, Take as directed by coumadin clinic, Disp: 40 tablet, Rfl: 3  Allergies: No Known Allergies  Past Medical History, Surgical history, Social history, and Family History were reviewed and updated.  Review of Systems: As above  Physical Exam:  weight is 227 lb 12.8 oz (103.3 kg). His oral temperature is 98.2 F (36.8 C). His blood pressure is 92/59 (abnormal) and his pulse is 78. His respiration is 16 and oxygen saturation is 98%.   Well-developed and well-nourished white woman. Head and exam shows no ocular or oral lesions. He has no palpable cervical or supraclavicular lymph nodes. Lungs are clear bilaterally. Cardiac exam regular rate and rhythm with a systolic click consistent with his mechanical valve. . Abdomen is soft. He has good bowel sounds. There is no fluid wave. He has a well-healed laparotomy scar in the right  upper quadrant. There is no palpable liver or spleen tip. Neck exam shows no tenderness over the spine ribs or hips. Extremities shows no clubbing, cyanosis or edema. He has good range of motion of his joints. Skin exam no rashes.   Lab Results  Component Value Date   WBC 10.0 03/30/2016   HGB 14.1 03/30/2016   HCT 40.0 03/30/2016   MCV 89 03/30/2016   PLT 285 03/30/2016     Chemistry      Component Value Date/Time   NA 140 01/19/2016 0915   NA 138 09/23/2015 1020   K 4.7 01/19/2016 0915   K 4.4 09/23/2015 1020   CL 104 01/19/2016 0915   CO2 27 01/19/2016 0915   CO2 25 09/23/2015 1020     BUN 23 01/19/2016 0915   BUN 28.4 (H) 09/23/2015 1020   CREATININE 1.30 01/19/2016 0915   CREATININE 1.3 09/23/2015 1020      Component Value Date/Time   CALCIUM 9.3 01/19/2016 0915   CALCIUM 9.5 09/23/2015 1020   ALKPHOS 46 09/23/2015 1020   AST 41 (H) 09/23/2015 1020   ALT 29 09/23/2015 1020   BILITOT 0.46 09/23/2015 1020         Impression and Plan: [name] is a 56- gentleman with hemochromatosis. He is homozygous for the H63D mutation. He is doing quite well.  He is still able to help take care of his grandson who has special needs. He really enjoys doing this.  I'm sure that his [employer] are still okay. Again he was last phlebotomized back in August 2015.  Because he's done well, we'll see him back in one year. If we have. 10 back sooner, we certainly can.  Volanda Napoleon, MD 3/1/20184:06 PM

## 2016-03-31 ENCOUNTER — Encounter: Payer: Self-pay | Admitting: *Deleted

## 2016-03-31 ENCOUNTER — Ambulatory Visit (INDEPENDENT_AMBULATORY_CARE_PROVIDER_SITE_OTHER): Payer: Medicare Other | Admitting: *Deleted

## 2016-03-31 DIAGNOSIS — I359 Nonrheumatic aortic valve disorder, unspecified: Secondary | ICD-10-CM

## 2016-03-31 DIAGNOSIS — Z952 Presence of prosthetic heart valve: Secondary | ICD-10-CM | POA: Diagnosis not present

## 2016-03-31 LAB — FERRITIN: Ferritin: 69 ng/ml (ref 22–316)

## 2016-03-31 LAB — IRON AND TIBC
%SAT: 17 % — AB (ref 20–55)
Iron: 92 ug/dL (ref 42–163)
TIBC: 554 ug/dL — ABNORMAL HIGH (ref 202–409)
UIBC: 461 ug/dL — ABNORMAL HIGH (ref 117–376)

## 2016-03-31 LAB — POCT INR: INR: 2

## 2016-04-13 DIAGNOSIS — D0439 Carcinoma in situ of skin of other parts of face: Secondary | ICD-10-CM | POA: Diagnosis not present

## 2016-04-13 DIAGNOSIS — Z9119 Patient's noncompliance with other medical treatment and regimen: Secondary | ICD-10-CM | POA: Diagnosis not present

## 2016-04-13 DIAGNOSIS — L570 Actinic keratosis: Secondary | ICD-10-CM | POA: Diagnosis not present

## 2016-04-28 ENCOUNTER — Encounter: Payer: Self-pay | Admitting: Family

## 2016-05-02 MED ORDER — BASAGLAR KWIKPEN 100 UNIT/ML ~~LOC~~ SOPN: 34.0000 [IU] | PEN_INJECTOR | Freq: Every day | SUBCUTANEOUS | 0 refills | Status: DC

## 2016-05-02 NOTE — Telephone Encounter (Signed)
Edward-- Please see pt's attached glucose readings and advise?

## 2016-05-02 NOTE — Telephone Encounter (Signed)
Per verbal from [place]-- based on current readings ok to decrease to 34 units a day. Continue checking sugar and let us know if sugar starts to spike. Needs f/u with PCP now. Sent [employer].

## 2016-05-05 ENCOUNTER — Ambulatory Visit (INDEPENDENT_AMBULATORY_CARE_PROVIDER_SITE_OTHER): Payer: Medicare Other

## 2016-05-05 DIAGNOSIS — Z952 Presence of prosthetic heart valve: Secondary | ICD-10-CM

## 2016-05-05 DIAGNOSIS — I359 Nonrheumatic aortic valve disorder, unspecified: Secondary | ICD-10-CM

## 2016-05-05 LAB — POCT INR: INR: 2.5

## 2016-05-26 ENCOUNTER — Ambulatory Visit (INDEPENDENT_AMBULATORY_CARE_PROVIDER_SITE_OTHER): Payer: Medicare Other | Admitting: Family

## 2016-05-26 ENCOUNTER — Encounter: Payer: Self-pay | Admitting: Family

## 2016-05-26 VITALS — BP 112/60 | HR 73 | Temp 97.9°F | Resp 18 | Ht 70.0 in | Wt 230.6 lb

## 2016-05-26 DIAGNOSIS — Z794 Long term (current) use of insulin: Secondary | ICD-10-CM

## 2016-05-26 DIAGNOSIS — I10 Essential (primary) hypertension: Secondary | ICD-10-CM | POA: Diagnosis not present

## 2016-05-26 DIAGNOSIS — E118 Type 2 diabetes mellitus with unspecified complications: Secondary | ICD-10-CM

## 2016-05-26 DIAGNOSIS — IMO0002 Reserved for concepts with insufficient information to code with codable children: Secondary | ICD-10-CM

## 2016-05-26 DIAGNOSIS — E785 Hyperlipidemia, unspecified: Secondary | ICD-10-CM | POA: Diagnosis not present

## 2016-05-26 DIAGNOSIS — E1165 Type 2 diabetes mellitus with hyperglycemia: Secondary | ICD-10-CM | POA: Diagnosis not present

## 2016-05-26 DIAGNOSIS — E119 Type 2 diabetes mellitus without complications: Secondary | ICD-10-CM

## 2016-05-26 LAB — BASIC METABOLIC PANEL
BUN: 37 mg/dL — AB (ref 6–23)
CO2: 25 meq/L (ref 19–32)
Calcium: 9.9 mg/dL (ref 8.4–10.5)
Chloride: 104 mEq/L (ref 96–112)
Creatinine, Ser: 1.37 mg/dL (ref 0.40–1.50)
GFR: 54.41 mL/min — AB (ref >60.00)
GLUCOSE: 183 mg/dL — AB (ref 70–99)
POTASSIUM: 4.4 meq/L (ref 3.5–5.1)
SODIUM: 137 meq/L (ref 135–145)

## 2016-05-26 LAB — HEMOGLOBIN A1C: Hgb A1c MFr Bld: 7.5 % — ABNORMAL HIGH (ref 4.6–6.5)

## 2016-05-26 LAB — LIPID PANEL
CHOL/HDL RATIO: 6
Cholesterol: 145 mg/dL (ref 0–200)
HDL: 25.9 mg/dL — AB (ref >39.00)
Triglycerides: 439 mg/dL — ABNORMAL HIGH (ref 0.0–149.0)

## 2016-05-26 LAB — LDL CHOLESTEROL, DIRECT: LDL DIRECT: 73 mg/dL

## 2016-05-26 MED ORDER — FENOFIBRATE 145 MG PO TABS: 145.0000 mg | ORAL_TABLET | Freq: Every day | ORAL | 1 refills | Status: DC

## 2016-05-26 MED ORDER — METOPROLOL SUCCINATE ER 25 MG PO TB24: 12.5000 mg | ORAL_TABLET | Freq: Every day | ORAL | 1 refills | Status: DC

## 2016-05-26 MED ORDER — BASAGLAR KWIKPEN 100 UNIT/ML ~~LOC~~ SOPN: 38.0000 [IU] | PEN_INJECTOR | Freq: Every day | SUBCUTANEOUS | 0 refills | Status: DC

## 2016-05-26 MED ORDER — OLMESARTAN MEDOXOMIL-HCTZ 20-12.5 MG PO TABS: 0.5000 | ORAL_TABLET | Freq: Every day | ORAL | 1 refills | Status: DC

## 2016-05-26 NOTE — Assessment & Plan Note (Signed)
Uncontrolled.  Will increase basaglar from 34 to 38 units. Lab Results  Component Value Date   HGBA1C 7.5 (H) 05/26/2016

## 2016-05-26 NOTE — Assessment & Plan Note (Signed)
Lab Results  Component Value Date   CHOL 145 05/26/2016   HDL 25.90 (L) 05/26/2016   LDLCALC 54 11/25/2010   LDLDIRECT 73.0 05/26/2016   TRIG (H) 05/26/2016    439.0 Triglyceride is over 400; calculations on Lipids are invalid.   CHOLHDL 6 05/26/2016   Follow up trigs elevated, non-fasting. See mychart messate. Continue fenofibrate/statin.

## 2016-05-26 NOTE — Progress Notes (Signed)
Pre visit review using our clinic review tool, if applicable. No additional management support is needed unless otherwise documented below in the visit note. 

## 2016-05-26 NOTE — Progress Notes (Signed)
Subjective:    Patient ID: [name], male    DOB: Apr 28, 1945, 71 y.o.   MRN: [id number]  HPI  [name] is a 71 yr old male who presents today for follow up.  1) DM2- maintained on basaglar and metformin. Reports sugar has averaged 110's.   Lab Results  Component Value Date   HGBA1C 7.3 (H) 01/19/2016   HGBA1C 7.2 (H) 10/15/2015   HGBA1C 7.2 (H) 07/14/2015   Lab Results  Component Value Date   MICROALBUR 8.7 (H) 09/09/2014   LDLCALC 54 11/25/2010   CREATININE 1.30 (H) 03/30/2016   2) Hyperlipidemia- maintained on atorvastatin.  Lab Results  Component Value Date   CHOL 129 01/19/2016   HDL 28.90 (L) 01/19/2016   LDLCALC 54 11/25/2010   LDLDIRECT 67.0 01/19/2016   TRIG 269.0 (H) 01/19/2016   CHOLHDL 4 01/19/2016   3) HTN- currently maintained on benicar hct and toprol.   BP Readings from Last 3 Encounters:  05/26/16 112/60  03/30/16 (!) 92/59  01/19/16 132/64     Review of Systems   See HPI   Past Medical History:  Diagnosis Date  . Allergy   . Aortic stenosis   . CAD (coronary artery disease)    cabg  . Cancer [facility]) 2011   prostate  . Erectile dysfunction   . Heart murmur   . Hemochromatosis 02/14/2011  . Hemorrhoids   . History of gout   . History of hepatitis 1974  . Hyperlipidemia   . Hypertension   . Hypertr obst cardiomyop   . Obesity    moderate  . Sensorineural hearing loss   . Type II or unspecified type diabetes mellitus without mention of complication, not stated as uncontrolled      Social History   Social History  . Marital status: Married    Spouse name: N/A  . Number of children: N/A  . Years of education: N/A   Occupational History  . Not on file.   Social History Main Topics  . Smoking status: Former Smoker    Types: Cigars  . Smokeless tobacco: Never Used     Comment: Never used Tobacco  . Alcohol use 0.0 oz/week     Comment: 4 glasses wine a month  . Drug use: No  . Sexual activity: No   Other Topics  Concern  . Not on file   Social History Narrative  . No narrative on file    Past Surgical History:  Procedure Laterality Date  . 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 APPENDECTOMY  1990  . CHOLECYSTECTOMY  1990  . CORONARY ARTERY BYPASS GRAFT  10/2003  . HERNIA REPAIR  1999   right, inguinal  . HERNIA REPAIR  2002   left, inguinal  . HIP SURGERY  2006   right hip  . PILONIDAL CYST EXCISION  1964  . prostate seed implant  3/12  . TONSILLECTOMY  childhood    Family History  Problem Relation Age of Onset  . Heart disease Mother   . Stroke Mother   . Heart disease Father   . Asperger's syndrome Son   . Hyperlipidemia Son   . Coronary artery disease Brother   . Cancer Neg Hx     negative for colon cancer    No Known Allergies  Current Outpatient Prescriptions on File Prior to Visit  Medication Sig Dispense Refill  . amoxicillin (AMOXIL) 500 MG capsule 4 tabs by mouth prior  to dental procedure. 4 capsule 0  . atorvastatin (LIPITOR) 20 MG tablet TAKE ONE (1) TABLET BY MOUTH EVERY DAY 90 tablet 1  . colchicine 0.6 MG tablet 2 tabs by mouth at start or gout flare, then 1 tab 1 hour later.  May repeat following day if no improvement. 9 tablet 5  . fenofibrate (TRICOR) 145 MG tablet Take 1 tablet (145 mg total) by mouth daily. 30 tablet 5  . glucose blood (FREESTYLE LITE) test strip Dx: E11.9 100 each 5  . Insulin Glargine (BASAGLAR KWIKPEN) 100 UNIT/ML SOPN Inject 0.34 mLs (34 Units total) into the skin at bedtime. 5 pen 0  . Lancets (FREESTYLE) lancets Use as directed to check blood sugar twice a day. 100 each 5  . metFORMIN (GLUCOPHAGE) 500 MG tablet TAKE TWO TABLETS BY MOUTH TWICE DAILY 120 tablet 5  . metoprolol succinate (TOPROL-XL) 25 MG 24 hr tablet Take 0.5 tablets (12.5 mg total) by mouth daily. 30 tablet 6  . NOVOFINE PLUS 32G X 4 MM MISC USE FOR INJECTIONS INTO THE SKIN EVERY EVENING 100 each 1  . olmesartan-hydrochlorothiazide (BENICAR HCT) 20-12.5 MG  tablet Take 1 tablet by mouth daily. 30 tablet 5  . Omega-3 Fatty Acids (FISH OIL) 1000 MG CAPS Take 1 capsule by mouth daily.     Marland Kitchen warfarin (COUMADIN) 7.5 MG tablet Take as directed by coumadin clinic 40 tablet 3   No current facility-administered medications on file prior to visit.     BP 112/60 (BP Location: Right Arm, Cuff Size: Large)   Pulse 73   Temp 97.9 F (36.6 C) (Oral)   Resp 18   Ht 5\' 10"  (1.778 m)   Wt 230 lb 9.6 oz (104.6 kg)   SpO2 98% Comment: room air  BMI 33.09 kg/m        Objective:   Physical Exam  Constitutional: He is oriented to person, place, and time. He appears well-developed and well-nourished. No distress.  HENT:  Head: Normocephalic and atraumatic.  Cardiovascular: Normal rate and regular rhythm.   No murmur heard. Pulmonary/Chest: Effort normal and breath sounds normal. No respiratory distress. He has no wheezes. He has no rales.  Musculoskeletal:  2-3+ bilateral LE edema  Neurological: He is alert and oriented to person, place, and time.  Skin: Skin is warm and dry.  Psychiatric: He has a normal mood and affect. His behavior is normal. Thought content normal.          Assessment & Plan:

## 2016-05-26 NOTE — Assessment & Plan Note (Signed)
Stable on current meds.  Continue same. 

## 2016-05-26 NOTE — Patient Instructions (Signed)
Please complete lab work prior to leaving.   

## 2016-05-30 ENCOUNTER — Encounter: Payer: Self-pay | Admitting: Family

## 2016-06-16 ENCOUNTER — Ambulatory Visit (INDEPENDENT_AMBULATORY_CARE_PROVIDER_SITE_OTHER): Payer: Medicare Other | Admitting: *Deleted

## 2016-06-16 ENCOUNTER — Encounter: Payer: Self-pay | Admitting: Family

## 2016-06-16 DIAGNOSIS — I359 Nonrheumatic aortic valve disorder, unspecified: Secondary | ICD-10-CM

## 2016-06-16 DIAGNOSIS — Z952 Presence of prosthetic heart valve: Secondary | ICD-10-CM

## 2016-06-16 LAB — POCT INR: INR: 2.3

## 2016-06-29 ENCOUNTER — Encounter: Payer: Self-pay | Admitting: Family

## 2016-07-10 ENCOUNTER — Other Ambulatory Visit: Payer: Self-pay

## 2016-07-10 ENCOUNTER — Other Ambulatory Visit: Payer: Self-pay | Admitting: *Deleted

## 2016-07-10 ENCOUNTER — Telehealth: Payer: Self-pay | Admitting: *Deleted

## 2016-07-10 MED ORDER — INSULIN PEN NEEDLE 32G X 4 MM MISC: 1 refills | Status: DC

## 2016-07-10 MED ORDER — INSULIN GLARGINE 100 UNIT/ML SOLOSTAR PEN: 38.0000 [IU] | PEN_INJECTOR | Freq: Every day | SUBCUTANEOUS | 5 refills | Status: DC

## 2016-07-10 NOTE — Telephone Encounter (Signed)
Faxed received from [facility] for change of medication; "patient request to have Lantus Insulin instead of Basaglar, [facility]"; Please Advise on refills/request..SLS 06/11 Last AEX - 05/26/16 Next AEX - 4-Mths.

## 2016-07-10 NOTE — Progress Notes (Signed)
Faxed refill request received from [facility] [address] for [employer]; this is new pharmacy. Refill sent per [facility] refill protocol/SLS

## 2016-07-11 ENCOUNTER — Other Ambulatory Visit: Payer: Self-pay | Admitting: Cardiology

## 2016-07-11 MED ORDER — WARFARIN SODIUM 7.5 MG PO TABS: ORAL_TABLET | ORAL | 3 refills | Status: DC

## 2016-07-11 NOTE — Telephone Encounter (Signed)
°*  STAT* If patient is at the pharmacy, call can be transferred to refill team.   1. Which medications need to be refilled? (please list name of each medication and dose if known)Warfarin-need new prescription for new pharmacy  2. Which pharmacy/location (including street and city if local pharmacy) is medication to be sent to [place]  3. Do they need a 30 day or 90 day supply? 30 and refills

## 2016-07-11 NOTE — Telephone Encounter (Signed)
[facility] done as requested [facility]

## 2016-07-18 ENCOUNTER — Encounter: Payer: Self-pay | Admitting: Family

## 2016-07-19 ENCOUNTER — Ambulatory Visit (INDEPENDENT_AMBULATORY_CARE_PROVIDER_SITE_OTHER): Payer: Medicare HMO | Admitting: Family

## 2016-07-19 ENCOUNTER — Encounter: Payer: Self-pay | Admitting: Family

## 2016-07-19 VITALS — BP 128/67 | HR 64 | Temp 98.4°F | Resp 18 | Ht 70.0 in | Wt 223.0 lb

## 2016-07-19 DIAGNOSIS — R600 Localized edema: Secondary | ICD-10-CM | POA: Diagnosis not present

## 2016-07-19 DIAGNOSIS — R69 Illness, unspecified: Secondary | ICD-10-CM | POA: Diagnosis not present

## 2016-07-19 MED ORDER — FUROSEMIDE 20 MG PO TABS: 20.0000 mg | ORAL_TABLET | Freq: Every day | ORAL | 3 refills | Status: DC | PRN

## 2016-07-19 MED ORDER — GLUCOSE BLOOD VI STRP: ORAL_STRIP | 1 refills | Status: DC

## 2016-07-19 MED ORDER — ONETOUCH DELICA LANCETS FINE MISC: 1 refills | Status: DC

## 2016-07-19 MED ORDER — ONETOUCH VERIO FLEX SYSTEM W/DEVICE KIT: 1.0000 | PACK | Freq: Two times a day (BID) | 0 refills | Status: DC

## 2016-07-19 NOTE — Progress Notes (Signed)
Subjective:    Patient ID: [name], male    DOB: 17-May-1945, 71 y.o.   MRN: [id number]  HPI  [name] is a 71 yr old male who presents today with chief complaint of foot swelling.  He reports that his leg swelling is bilateral but is worse on the left side. Denies associated pain.    Review of Systems See HPI  Past Medical History:  Diagnosis Date  . Allergy   . Aortic stenosis   . CAD (coronary artery disease)    cabg  . Cancer [facility]) 2011   prostate  . Erectile dysfunction   . Heart murmur   . Hemochromatosis 02/14/2011  . Hemorrhoids   . History of gout   . History of hepatitis 1974  . Hyperlipidemia   . Hypertension   . Hypertr obst cardiomyop   . Obesity    moderate  . Sensorineural hearing loss   . Type II or unspecified type diabetes mellitus without mention of complication, not stated as uncontrolled      Social History   Social History  . Marital status: Married    Spouse name: N/A  . Number of children: N/A  . Years of education: N/A   Occupational History  . Not on file.   Social History Main Topics  . Smoking status: Former Smoker    Types: Cigars  . Smokeless tobacco: Never Used     Comment: Never used Tobacco  . Alcohol use 0.0 oz/week     Comment: 4 glasses wine a month  . Drug use: No  . Sexual activity: No   Other Topics Concern  . Not on file   Social History Narrative  . No narrative on file    Past Surgical History:  Procedure Laterality Date  . 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 APPENDECTOMY  1990  . CHOLECYSTECTOMY  1990  . CORONARY ARTERY BYPASS GRAFT  10/2003  . HERNIA REPAIR  1999   right, inguinal  . HERNIA REPAIR  2002   left, inguinal  . HIP SURGERY  2006   right hip  . PILONIDAL CYST EXCISION  1964  . prostate seed implant  3/12  . TONSILLECTOMY  childhood    Family History  Problem Relation Age of Onset  . Heart disease Mother   . Stroke Mother   . Heart disease Father   .  Asperger's syndrome Son   . Hyperlipidemia Son   . Coronary artery disease Brother   . Cancer Neg Hx        negative for colon cancer    No Known Allergies  Current Outpatient Prescriptions on File Prior to Visit  Medication Sig Dispense Refill  . amoxicillin (AMOXIL) 500 MG capsule 4 tabs by mouth prior to dental procedure. 4 capsule 0  . atorvastatin (LIPITOR) 20 MG tablet TAKE ONE (1) TABLET BY MOUTH EVERY DAY 90 tablet 1  . Blood Glucose Monitoring Suppl (ONETOUCH VERIO FLEX SYSTEM) w/Device KIT 1 each by Does not apply route 2 (two) times daily. 1 kit 0  . colchicine 0.6 MG tablet 2 tabs by mouth at start or gout flare, then 1 tab 1 hour later.  May repeat following day if no improvement. 9 tablet 5  . fenofibrate (TRICOR) 145 MG tablet Take 1 tablet (145 mg total) by mouth daily. 90 tablet 1  . glucose blood (ONETOUCH VERIO) test strip Use as instructed to check blood sugar twice a  day.  Dx  E11.8 200 each 1  . Insulin Glargine (LANTUS SOLOSTAR) 100 UNIT/ML Solostar Pen Inject 38 Units into the skin daily at 10 pm. 5 pen 5  . Insulin Pen Needle (NOVOFINE PLUS) 32G X 4 MM MISC USE FOR INJECTIONS INTO THE SKIN EVERY EVENING 100 each 1  . metFORMIN (GLUCOPHAGE) 500 MG tablet TAKE TWO TABLETS BY MOUTH TWICE DAILY 120 tablet 5  . metoprolol succinate (TOPROL-XL) 25 MG 24 hr tablet Take 0.5 tablets (12.5 mg total) by mouth daily. 45 tablet 1  . olmesartan-hydrochlorothiazide (BENICAR HCT) 20-12.5 MG tablet Take 0.5 tablets by mouth daily. 45 tablet 1  . Omega-3 Fatty Acids (FISH OIL) 1000 MG CAPS Take 1 capsule by mouth daily.     [name] Use to check blood sugar twice a day.  Dx  E11.8 200 each 1  . warfarin (COUMADIN) 7.5 MG tablet Take as directed by coumadin clinic 40 tablet 3   No current facility-administered medications on file prior to visit.     BP 128/67 (BP Location: Right Arm, Cuff Size: Large)   Pulse 64   Temp 98.4 F (36.9 C) (Oral)   Resp  18   Ht 5' 10"  (1.778 m)   Wt 223 lb (101.2 kg)   SpO2 99%   BMI 32.00 kg/m       Objective:   Physical Exam  Constitutional: He is oriented to person, place, and time. He appears well-developed and well-nourished. No distress.  HENT:  Head: Normocephalic and atraumatic.  Cardiovascular: Normal rate and regular rhythm.   +click from prosthetic valve noted  RLE swelling is 2-3+ LLE swelling is 3-4+  Pulmonary/Chest: Effort normal and breath sounds normal. No respiratory distress. He has no wheezes. He has no rales.  Musculoskeletal: He exhibits no edema.  Neurological: He is alert and oriented to person, place, and time.  Skin: Skin is warm and dry.  Psychiatric: He has a normal mood and affect. His behavior is normal. Thought content normal.          Assessment & Plan:  Bilateral LE edema- L>R, I suspect that some of this is related to previous vein grafting for his CABG and subsequent venous insufficiency.  I doubt DVT (he is anticoagulated with coumadin due to his valve). Will add prn lasix. Follow up in 1 week. Plan to reassess edema at that time and check bmet

## 2016-07-19 NOTE — Patient Instructions (Signed)
Please start lasix once daily in the AM as needed for swelling.

## 2016-07-20 ENCOUNTER — Encounter: Payer: Self-pay | Admitting: *Deleted

## 2016-07-20 ENCOUNTER — Ambulatory Visit: Payer: Medicare Other | Admitting: Family

## 2016-07-21 DIAGNOSIS — L814 Other melanin hyperpigmentation: Secondary | ICD-10-CM | POA: Diagnosis not present

## 2016-07-21 DIAGNOSIS — L570 Actinic keratosis: Secondary | ICD-10-CM | POA: Diagnosis not present

## 2016-07-21 DIAGNOSIS — Z85828 Personal history of other malignant neoplasm of skin: Secondary | ICD-10-CM | POA: Diagnosis not present

## 2016-07-21 DIAGNOSIS — Z08 Encounter for follow-up examination after completed treatment for malignant neoplasm: Secondary | ICD-10-CM | POA: Diagnosis not present

## 2016-07-24 ENCOUNTER — Ambulatory Visit (INDEPENDENT_AMBULATORY_CARE_PROVIDER_SITE_OTHER): Payer: Medicare HMO | Admitting: *Deleted

## 2016-07-24 DIAGNOSIS — Z952 Presence of prosthetic heart valve: Secondary | ICD-10-CM

## 2016-07-24 DIAGNOSIS — I359 Nonrheumatic aortic valve disorder, unspecified: Secondary | ICD-10-CM | POA: Diagnosis not present

## 2016-07-24 LAB — POCT INR: INR: 2.7

## 2016-07-24 MED ORDER — METFORMIN HCL 500 MG PO TABS: 1000.0000 mg | ORAL_TABLET | Freq: Two times a day (BID) | ORAL | 5 refills | Status: DC

## 2016-07-24 NOTE — Telephone Encounter (Signed)
Mr. Schaum,  I have sent your Metformin request to [facility], as this was the pharmacy who contacted [name], and also, this is where your glucose testing supplies were sent. If you need to change your pharmacy please contact us and let us know which one you need as your primary; as for now, we will continue to use [facility] as the primary. Thank You for choosing [facility] !  Best Regards,  Fleet Contras, CMA AAMA

## 2016-07-25 ENCOUNTER — Encounter: Payer: Self-pay | Admitting: Family

## 2016-07-25 ENCOUNTER — Ambulatory Visit (INDEPENDENT_AMBULATORY_CARE_PROVIDER_SITE_OTHER): Payer: Medicare HMO | Admitting: Family

## 2016-07-25 VITALS — BP 114/64 | HR 65 | Temp 98.4°F | Resp 16 | Ht 70.0 in | Wt 223.4 lb

## 2016-07-25 DIAGNOSIS — R609 Edema, unspecified: Secondary | ICD-10-CM

## 2016-07-25 NOTE — Progress Notes (Signed)
Subjective:    Patient ID: [name], male    DOB: 12-Dec-1945, 71 y.o.   MRN: [id number]  HPI  [name] is a 71 yr old male who present today for follow up of his LE edema.  He was given PRN lasix.  States that he took one tablet of lasix only. Did not understand the "prn" instructions. Notes "slight" improvement in his LE edema. He denies SOB.    Review of Systems See HPI  Past Medical History:  Diagnosis Date  . Allergy   . Aortic stenosis   . CAD (coronary artery disease)    cabg  . Cancer [facility]) 2011   prostate  . Erectile dysfunction   . Heart murmur   . Hemochromatosis 02/14/2011  . Hemorrhoids   . History of gout   . History of hepatitis 1974  . Hyperlipidemia   . Hypertension   . Hypertr obst cardiomyop   . Obesity    moderate  . Sensorineural hearing loss   . Type II or unspecified type diabetes mellitus without mention of complication, not stated as uncontrolled      Social History   Social History  . Marital status: Married    Spouse name: N/A  . Number of children: N/A  . Years of education: N/A   Occupational History  . Not on file.   Social History Main Topics  . Smoking status: Former Smoker    Types: Cigars  . Smokeless tobacco: Never Used     Comment: Never used Tobacco  . Alcohol use 0.0 oz/week     Comment: 4 glasses wine a month  . Drug use: No  . Sexual activity: No   Other Topics Concern  . Not on file   Social History Narrative  . No narrative on file    Past Surgical History:  Procedure Laterality Date  . 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 APPENDECTOMY  1990  . CHOLECYSTECTOMY  1990  . CORONARY ARTERY BYPASS GRAFT  10/2003  . HERNIA REPAIR  1999   right, inguinal  . HERNIA REPAIR  2002   left, inguinal  . HIP SURGERY  2006   right hip  . PILONIDAL CYST EXCISION  1964  . prostate seed implant  3/12  . TONSILLECTOMY  childhood    Family History  Problem Relation Age of Onset  . Heart disease  Mother   . Stroke Mother   . Heart disease Father   . Asperger's syndrome Son   . Hyperlipidemia Son   . Coronary artery disease Brother   . Cancer Neg Hx        negative for colon cancer    No Known Allergies  Current Outpatient Prescriptions on File Prior to Visit  Medication Sig Dispense Refill  . amoxicillin (AMOXIL) 500 MG capsule 4 tabs by mouth prior to dental procedure. 4 capsule 0  . atorvastatin (LIPITOR) 20 MG tablet TAKE ONE (1) TABLET BY MOUTH EVERY DAY 90 tablet 1  . Blood Glucose Monitoring Suppl (ONETOUCH VERIO FLEX SYSTEM) w/Device KIT 1 each by Does not apply route 2 (two) times daily. 1 kit 0  . colchicine 0.6 MG tablet 2 tabs by mouth at start or gout flare, then 1 tab 1 hour later.  May repeat following day if no improvement. 9 tablet 5  . fenofibrate (TRICOR) 145 MG tablet Take 1 tablet (145 mg total) by mouth daily. 90 tablet 1  . furosemide (  LASIX) 20 MG tablet Take 1 tablet (20 mg total) by mouth daily as needed. For swelling 30 tablet 3  . glucose blood (ONETOUCH VERIO) test strip Use as instructed to check blood sugar twice a day.  Dx  E11.8 200 each 1  . imiquimod (ALDARA) 5 % cream Apply topically 3 (three) times a week.    . Insulin Glargine (LANTUS SOLOSTAR) 100 UNIT/ML Solostar Pen Inject 38 Units into the skin daily at 10 pm. 5 pen 5  . Insulin Pen Needle (NOVOFINE PLUS) 32G X 4 MM MISC USE FOR INJECTIONS INTO THE SKIN EVERY EVENING 100 each 1  . metFORMIN (GLUCOPHAGE) 500 MG tablet Take 2 tablets (1,000 mg total) by mouth 2 (two) times daily. 120 tablet 5  . metoprolol succinate (TOPROL-XL) 25 MG 24 hr tablet Take 0.5 tablets (12.5 mg total) by mouth daily. 45 tablet 1  . olmesartan-hydrochlorothiazide (BENICAR HCT) 20-12.5 MG tablet Take 0.5 tablets by mouth daily. 45 tablet 1  . Omega-3 Fatty Acids (FISH OIL) 1000 MG CAPS Take 1 capsule by mouth daily.     [name] Use to check blood sugar twice a day.  Dx  E11.8 200 each 1    . warfarin (COUMADIN) 7.5 MG tablet Take as directed by coumadin clinic 40 tablet 3   No current facility-administered medications on file prior to visit.     BP 114/64 (BP Location: Left Arm, Cuff Size: Large)   Pulse 65   Temp 98.4 F (36.9 C) (Oral)   Resp 16   Ht _0  (1.778 m)   Wt 223 lb 6.4 oz (101.3 kg)   SpO2 99%   BMI 32.05 kg/m       Objective:   Physical Exam  Constitutional: He is oriented to person, place, and time. He appears well-developed and well-nourished. No distress.  HENT:  Head: Normocephalic and atraumatic.  Cardiovascular: Normal rate and regular rhythm.   No murmur heard. Pulmonary/Chest: Effort normal and breath sounds normal. No respiratory distress. He has no wheezes. He has no rales.  Musculoskeletal:  2+ RLE edema 3+ LLE edema  Neurological: He is alert and oriented to person, place, and time.  Skin: Skin is warm and dry.  Psychiatric: He has a normal mood and affect. His behavior is normal. Thought content normal.          Assessment & Plan:  LE edema- only slightly improved.  Advised pt to take once daily x 3 days, then prn.  Will ask him to return in 1 week for bmet as we will need to re-assess his K+.

## 2016-07-25 NOTE — Patient Instructions (Signed)
Take the lasix once daily for the next 3 days, then once daily as needed for swelling.  Follow up in 1 week for lab visit only.

## 2016-07-31 ENCOUNTER — Encounter: Payer: Self-pay | Admitting: Family

## 2016-07-31 ENCOUNTER — Other Ambulatory Visit (INDEPENDENT_AMBULATORY_CARE_PROVIDER_SITE_OTHER): Payer: Medicare HMO

## 2016-07-31 ENCOUNTER — Telehealth: Payer: Self-pay | Admitting: Family

## 2016-07-31 DIAGNOSIS — R609 Edema, unspecified: Secondary | ICD-10-CM | POA: Diagnosis not present

## 2016-07-31 LAB — BASIC METABOLIC PANEL
BUN: 37 mg/dL — ABNORMAL HIGH (ref 6–23)
CALCIUM: 9.2 mg/dL (ref 8.4–10.5)
CO2: 24 mEq/L (ref 19–32)
CREATININE: 1.32 mg/dL (ref 0.40–1.50)
Chloride: 102 mEq/L (ref 96–112)
GFR: 56.77 mL/min — AB (ref >60.00)
Glucose, Bld: 148 mg/dL — ABNORMAL HIGH (ref 70–99)
Potassium: 4 mEq/L (ref 3.5–5.1)
SODIUM: 137 meq/L (ref 135–145)

## 2016-07-31 NOTE — Telephone Encounter (Signed)
Pt dropped off document with Blood Sugar reading for May - June - 1 page, for provider to have on chart. Document put at front office tray.

## 2016-07-31 NOTE — Telephone Encounter (Signed)
Received BP log; forwarded to provider/SLS 07/02

## 2016-08-18 ENCOUNTER — Telehealth: Payer: Self-pay | Admitting: *Deleted

## 2016-08-18 NOTE — Telephone Encounter (Signed)
Opened in Error/SLS 07/20

## 2016-08-22 ENCOUNTER — Encounter: Payer: Self-pay | Admitting: Family

## 2016-08-24 ENCOUNTER — Other Ambulatory Visit: Payer: Self-pay

## 2016-08-24 MED ORDER — METFORMIN HCL 500 MG PO TABS: 1000.0000 mg | ORAL_TABLET | Freq: Two times a day (BID) | ORAL | 5 refills | Status: DC

## 2016-08-29 ENCOUNTER — Telehealth: Payer: Self-pay | Admitting: Medical

## 2016-08-29 ENCOUNTER — Encounter: Payer: Self-pay | Admitting: Family

## 2016-08-29 NOTE — Telephone Encounter (Signed)
I reviewed pt note and recent blood sugar levels. These all look relatively good. I would not make any changes based on these numbers. The part about machine and strips. Would you call and clarify they have new machine and adequate strips. Looks like pt is also due for a visit also. Looks like a1-c done more than 3 months. So would have them schedule with [name] within next month.   I won't my chart pt back as you will be calling her. Thanks.

## 2016-08-29 NOTE — Telephone Encounter (Signed)
[name] -- Please advise in PCP's absence? 

## 2016-08-30 NOTE — Telephone Encounter (Signed)
Please check with patient make sure they have what they need

## 2016-08-30 NOTE — Telephone Encounter (Signed)
Notified pt of below. He confirmed that he now has what he needs for glucometer and test strips. Still using Onetouch Verio Flex but they had to send him another monitor as the first one he got was not working correctly.

## 2016-09-04 ENCOUNTER — Telehealth: Payer: Self-pay | Admitting: *Deleted

## 2016-09-04 ENCOUNTER — Ambulatory Visit (INDEPENDENT_AMBULATORY_CARE_PROVIDER_SITE_OTHER): Payer: Medicare HMO | Admitting: Pharmacist

## 2016-09-04 DIAGNOSIS — Z952 Presence of prosthetic heart valve: Secondary | ICD-10-CM | POA: Diagnosis not present

## 2016-09-04 DIAGNOSIS — I359 Nonrheumatic aortic valve disorder, unspecified: Secondary | ICD-10-CM | POA: Diagnosis not present

## 2016-09-04 LAB — POCT INR: INR: 2.5

## 2016-09-04 MED ORDER — METOPROLOL SUCCINATE ER 25 MG PO TB24: 12.5000 mg | ORAL_TABLET | Freq: Every day | ORAL | 1 refills | Status: DC

## 2016-09-04 NOTE — Telephone Encounter (Signed)
[facility]oprolol ER 25mg . Refills sent.

## 2016-09-18 DIAGNOSIS — R69 Illness, unspecified: Secondary | ICD-10-CM | POA: Diagnosis not present

## 2016-09-20 ENCOUNTER — Telehealth: Payer: Self-pay | Admitting: *Deleted

## 2016-09-20 MED ORDER — INSULIN PEN NEEDLE 32G X 4 MM MISC: 1 refills | Status: DC

## 2016-09-20 NOTE — Telephone Encounter (Signed)
Received fax from [facility]) stating rx from 07/10/16 was accidentally deleted and they are requesting another rx. Rx sent.

## 2016-09-26 ENCOUNTER — Ambulatory Visit (INDEPENDENT_AMBULATORY_CARE_PROVIDER_SITE_OTHER): Payer: Medicare HMO | Admitting: Family

## 2016-09-26 VITALS — BP 124/63 | HR 60 | Temp 98.3°F | Ht 70.0 in | Wt 221.0 lb

## 2016-09-26 DIAGNOSIS — IMO0002 Reserved for concepts with insufficient information to code with codable children: Secondary | ICD-10-CM

## 2016-09-26 DIAGNOSIS — E785 Hyperlipidemia, unspecified: Secondary | ICD-10-CM | POA: Diagnosis not present

## 2016-09-26 DIAGNOSIS — E118 Type 2 diabetes mellitus with unspecified complications: Secondary | ICD-10-CM | POA: Diagnosis not present

## 2016-09-26 DIAGNOSIS — Z794 Long term (current) use of insulin: Secondary | ICD-10-CM | POA: Diagnosis not present

## 2016-09-26 DIAGNOSIS — I10 Essential (primary) hypertension: Secondary | ICD-10-CM | POA: Diagnosis not present

## 2016-09-26 DIAGNOSIS — E1165 Type 2 diabetes mellitus with hyperglycemia: Secondary | ICD-10-CM | POA: Diagnosis not present

## 2016-09-26 LAB — LIPID PANEL
Cholesterol: 137 mg/dL (ref 0–200)
HDL: 25.3 mg/dL — ABNORMAL LOW (ref >39.00)
NONHDL: 111.76
TRIGLYCERIDES: 251 mg/dL — AB (ref 0.0–149.0)
Total CHOL/HDL Ratio: 5
VLDL: 50.2 mg/dL — ABNORMAL HIGH (ref 0.0–40.0)

## 2016-09-26 LAB — LDL CHOLESTEROL, DIRECT: LDL DIRECT: 71 mg/dL

## 2016-09-26 LAB — HEMOGLOBIN A1C: HEMOGLOBIN A1C: 7.3 % — AB (ref 4.6–6.5)

## 2016-09-26 MED ORDER — ATORVASTATIN CALCIUM 20 MG PO TABS: ORAL_TABLET | ORAL | 1 refills | Status: DC

## 2016-09-26 MED ORDER — INSULIN GLARGINE 100 UNIT/ML SOLOSTAR PEN: 38.0000 [IU] | PEN_INJECTOR | Freq: Every day | SUBCUTANEOUS | 1 refills | Status: DC

## 2016-09-26 MED ORDER — METFORMIN HCL 1000 MG PO TABS: 1000.0000 mg | ORAL_TABLET | Freq: Two times a day (BID) | ORAL | 1 refills | Status: DC

## 2016-09-26 MED ORDER — FUROSEMIDE 20 MG PO TABS: 20.0000 mg | ORAL_TABLET | Freq: Every day | ORAL | 1 refills | Status: DC | PRN

## 2016-09-26 MED ORDER — FENOFIBRATE 145 MG PO TABS: 145.0000 mg | ORAL_TABLET | Freq: Every day | ORAL | 1 refills | Status: DC

## 2016-09-26 MED ORDER — METOPROLOL SUCCINATE ER 25 MG PO TB24: 12.5000 mg | ORAL_TABLET | Freq: Every day | ORAL | 1 refills | Status: DC

## 2016-09-26 MED ORDER — OLMESARTAN MEDOXOMIL-HCTZ 20-12.5 MG PO TABS: 0.5000 | ORAL_TABLET | Freq: Every day | ORAL | 1 refills | Status: DC

## 2016-09-26 NOTE — Assessment & Plan Note (Signed)
Home sugars stable on current regimen- continue same, obtain follow up a1c.

## 2016-09-26 NOTE — Patient Instructions (Addendum)
Please complete lab work prior to leaving.   

## 2016-09-26 NOTE — Assessment & Plan Note (Signed)
BP stable, continue current meds. 

## 2016-09-26 NOTE — Assessment & Plan Note (Signed)
Obtain follow up lipids, continue statin/fenofibrate.

## 2016-09-26 NOTE — Addendum Note (Signed)
Addended by: Harl Bowie on: 09/26/2016 11:42 AM   Modules accepted: Orders

## 2016-09-26 NOTE — Progress Notes (Signed)
   Subjective:    Patient ID: [name], male    DOB: Dec 01, 1945, 71 y.o.   MRN: [id number]  HPI  Patient is a 71 year old male who presents today for follow-up.  1) DM2- current medications for his diabetes include metformin 1000 mg twice daily, and Lantus 38 units once daily. Brings home fasting sugars 100-120. Lab Results  Component Value Date   HGBA1C 7.5 (H) 05/26/2016   HGBA1C 7.3 (H) 01/19/2016   HGBA1C 7.2 (H) 10/15/2015   Lab Results  Component Value Date   MICROALBUR 8.7 (H) 09/09/2014   LDLCALC 54 11/25/2010   CREATININE 1.32 07/31/2016   2) Hyperlipidemia- maintained on TriCor 145 mg once daily as well as Lipitor 20 mg once daily. Lab Results  Component Value Date   CHOL 145 05/26/2016   HDL 25.90 (L) 05/26/2016   LDLCALC 54 11/25/2010   LDLDIRECT 73.0 05/26/2016   TRIG (H) 05/26/2016    439.0 Triglyceride is over 400; calculations on Lipids are invalid.   CHOLHDL 6 05/26/2016   3) HTN- current blood pressure medication includes Benicar HCT 20-12 0.5, metoprolol 25 mg one half tab once daily. He is also using Lasix 20 mg once daily as needed for swelling. BP Readings from Last 3 Encounters:  09/26/16 124/63  07/25/16 114/64  07/19/16 128/67      Review of Systems     Objective:   Physical Exam  Constitutional: He is oriented to person, place, and time. He appears well-developed and well-nourished. No distress.  HENT:  Head: Normocephalic and atraumatic.  Cardiovascular: Normal rate and regular rhythm.   No murmur heard. Pulmonary/Chest: Effort normal and breath sounds normal. No respiratory distress. He has no wheezes. He has no rales.  Musculoskeletal:  3+ bilateral pedal edema  Neurological: He is alert and oriented to person, place, and time.  Skin: Skin is warm and dry.  Psychiatric: He has a normal mood and affect. His behavior is normal. Thought content normal.          Assessment & Plan:

## 2016-09-28 ENCOUNTER — Telehealth: Payer: Self-pay | Admitting: Family

## 2016-09-28 MED ORDER — INSULIN GLARGINE 100 UNIT/ML SOLOSTAR PEN: 41.0000 [IU] | PEN_INJECTOR | Freq: Every day | SUBCUTANEOUS | 1 refills | Status: DC

## 2016-09-28 NOTE — Telephone Encounter (Signed)
Triglycerides are improving.  Sugar remains slightly above goal.  I would like him to increase lantus to 41 units once daily.

## 2016-09-29 ENCOUNTER — Encounter: Payer: Self-pay | Admitting: Family

## 2016-09-29 NOTE — Telephone Encounter (Signed)
Left message for pt to return my call.

## 2016-09-29 NOTE — Telephone Encounter (Signed)
See pt email from 09/29/16. Result sent there.

## 2016-10-03 ENCOUNTER — Other Ambulatory Visit: Payer: Self-pay | Admitting: Family Medicine

## 2016-10-03 DIAGNOSIS — R69 Illness, unspecified: Secondary | ICD-10-CM | POA: Diagnosis not present

## 2016-10-04 ENCOUNTER — Encounter: Payer: Self-pay | Admitting: Family

## 2016-10-04 NOTE — Telephone Encounter (Signed)
[facility] and confirmed that they received Novofine pen needle Rx from 09/20/16 and it is ready for pt to pick up. Notified pt. Advised him it looked like pharmacy sent refill request to Dr Sarajane Jews and that was denied as he is not pt's PCP and rx had already been sent. Pt voices understanding.

## 2016-10-06 ENCOUNTER — Encounter: Payer: Self-pay | Admitting: Family

## 2016-10-06 NOTE — Telephone Encounter (Signed)
Message copied and pasted in pt's [name]) chart.

## 2016-10-16 ENCOUNTER — Ambulatory Visit (INDEPENDENT_AMBULATORY_CARE_PROVIDER_SITE_OTHER): Payer: Medicare HMO | Admitting: Pharmacist Clinician (PhC)/ Clinical Pharmacy Specialist

## 2016-10-16 DIAGNOSIS — Z7901 Long term (current) use of anticoagulants: Secondary | ICD-10-CM | POA: Diagnosis not present

## 2016-10-16 DIAGNOSIS — I359 Nonrheumatic aortic valve disorder, unspecified: Secondary | ICD-10-CM | POA: Diagnosis not present

## 2016-10-16 DIAGNOSIS — Z952 Presence of prosthetic heart valve: Secondary | ICD-10-CM | POA: Diagnosis not present

## 2016-10-16 LAB — POCT INR: INR: 1.8

## 2016-10-18 ENCOUNTER — Encounter: Payer: Self-pay | Admitting: Family

## 2016-10-24 ENCOUNTER — Encounter: Payer: Self-pay | Admitting: Family

## 2016-11-07 ENCOUNTER — Encounter: Payer: Self-pay | Admitting: Family

## 2016-11-10 ENCOUNTER — Ambulatory Visit (INDEPENDENT_AMBULATORY_CARE_PROVIDER_SITE_OTHER): Payer: Medicare HMO | Admitting: *Deleted

## 2016-11-10 DIAGNOSIS — Z952 Presence of prosthetic heart valve: Secondary | ICD-10-CM

## 2016-11-10 DIAGNOSIS — I359 Nonrheumatic aortic valve disorder, unspecified: Secondary | ICD-10-CM | POA: Diagnosis not present

## 2016-11-10 DIAGNOSIS — Z5181 Encounter for therapeutic drug level monitoring: Secondary | ICD-10-CM

## 2016-11-10 DIAGNOSIS — C61 Malignant neoplasm of prostate: Secondary | ICD-10-CM | POA: Diagnosis not present

## 2016-11-10 LAB — POCT INR: INR: 2.2

## 2016-11-15 DIAGNOSIS — C61 Malignant neoplasm of prostate: Secondary | ICD-10-CM | POA: Diagnosis not present

## 2016-11-23 DIAGNOSIS — K649 Unspecified hemorrhoids: Secondary | ICD-10-CM | POA: Diagnosis not present

## 2016-11-23 DIAGNOSIS — E669 Obesity, unspecified: Secondary | ICD-10-CM | POA: Diagnosis not present

## 2016-11-23 DIAGNOSIS — E8770 Fluid overload, unspecified: Secondary | ICD-10-CM | POA: Diagnosis not present

## 2016-11-23 DIAGNOSIS — E119 Type 2 diabetes mellitus without complications: Secondary | ICD-10-CM | POA: Diagnosis not present

## 2016-11-23 DIAGNOSIS — L570 Actinic keratosis: Secondary | ICD-10-CM | POA: Diagnosis not present

## 2016-11-23 DIAGNOSIS — Z0000 Encounter for general adult medical examination without abnormal findings: Secondary | ICD-10-CM | POA: Diagnosis not present

## 2016-11-23 DIAGNOSIS — E785 Hyperlipidemia, unspecified: Secondary | ICD-10-CM | POA: Diagnosis not present

## 2016-11-23 DIAGNOSIS — H9112 Presbycusis, left ear: Secondary | ICD-10-CM | POA: Diagnosis not present

## 2016-11-23 DIAGNOSIS — I10 Essential (primary) hypertension: Secondary | ICD-10-CM | POA: Diagnosis not present

## 2016-11-23 DIAGNOSIS — R600 Localized edema: Secondary | ICD-10-CM | POA: Diagnosis not present

## 2016-11-29 ENCOUNTER — Encounter: Payer: Self-pay | Admitting: Family

## 2016-12-04 ENCOUNTER — Encounter: Payer: Self-pay | Admitting: Family

## 2016-12-06 ENCOUNTER — Other Ambulatory Visit: Payer: Self-pay | Admitting: Cardiology

## 2016-12-08 ENCOUNTER — Encounter: Payer: Self-pay | Admitting: Physician Assistant

## 2016-12-08 ENCOUNTER — Ambulatory Visit: Payer: Medicare HMO | Admitting: Physician Assistant

## 2016-12-08 VITALS — BP 138/72 | HR 62 | Ht 70.0 in | Wt 225.0 lb

## 2016-12-08 DIAGNOSIS — Z952 Presence of prosthetic heart valve: Secondary | ICD-10-CM | POA: Diagnosis not present

## 2016-12-08 DIAGNOSIS — Z794 Long term (current) use of insulin: Secondary | ICD-10-CM | POA: Diagnosis not present

## 2016-12-08 DIAGNOSIS — I10 Essential (primary) hypertension: Secondary | ICD-10-CM

## 2016-12-08 DIAGNOSIS — R011 Cardiac murmur, unspecified: Secondary | ICD-10-CM

## 2016-12-08 DIAGNOSIS — E119 Type 2 diabetes mellitus without complications: Secondary | ICD-10-CM | POA: Diagnosis not present

## 2016-12-08 DIAGNOSIS — E785 Hyperlipidemia, unspecified: Secondary | ICD-10-CM

## 2016-12-08 DIAGNOSIS — I25810 Atherosclerosis of coronary artery bypass graft(s) without angina pectoris: Secondary | ICD-10-CM | POA: Diagnosis not present

## 2016-12-08 MED ORDER — OMEGA-3-ACID ETHYL ESTERS 1 G PO CAPS: 1.0000 g | ORAL_CAPSULE | Freq: Two times a day (BID) | ORAL | 6 refills | Status: DC

## 2016-12-08 MED ORDER — ATORVASTATIN CALCIUM 40 MG PO TABS: ORAL_TABLET | ORAL | 6 refills | Status: DC

## 2016-12-08 NOTE — Progress Notes (Signed)
Cardiology Office Note    Date:  12/10/2016   ID:  [name], DOB [birth year], MRN [id number]  PCP:  [name], NP  Cardiologist:  Dr. Stanford Breed  Chief Complaint  Patient presents with  . Follow-up    seen for Dr. Stanford Breed.     History of Present Illness:  [name] is a 71 y.o. male with PMH of HTN, HLD, DM II, CAD s/p CABG and AVR with mechanical aortic valve. He underwent CABG with LIMA to LAD, SVG to marginal, SVG to RCA and distal left circumflex along with aortic valve replacement with [employer] mechanical valve in 2005. He also had a septal myomectomy. Carotid ultrasound in May 2013 showed mild disease bilaterally. Echocardiogram in January 2015 oral showed a normal LV function, grade 2 DD, mechanical aortic valve with trace aortic insufficiency and mild to moderate mitral regurgitation, mean gradient across aortic valve 12 mmHg. Abdominal CT in April 2015 showed no aneurysm. Myoview in May 2016 showed normal perfusion, normal LV function.  He presents today for cardiology office visit. He continued to play golf. He denies any exertional chest discomfort or shortness of breath. He has been compliant with Coumadin and follow-up closely with Coumadin clinic. Last lipid panel obtained on 09/26/2016 demonstrated total cholesterol 137, HDL 25, LDL 71, triglyceride 251. He will continue on fenofibrate. I will increase his Lipitor to 40 mg daily. I will also add Lovaza '1000mg'$  BID. He will need fasting lipid panel and LFTs in 6-8 weeks.   Past Medical History:  Diagnosis Date  . Allergy   . Aortic stenosis   . CAD (coronary artery disease)    cabg  . Cancer [facility]) 2011   prostate  . Erectile dysfunction   . Heart murmur   . Hemochromatosis 02/14/2011  . Hemorrhoids   . History of gout   . History of hepatitis 1974  . Hyperlipidemia   . Hypertension   . Hypertr obst cardiomyop   . Obesity    moderate  . Sensorineural hearing loss   . Type II or unspecified  type diabetes mellitus without mention of complication, not stated as uncontrolled     Past Surgical History:  Procedure Laterality Date  . 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 APPENDECTOMY  1990  . CHOLECYSTECTOMY  1990  . CORONARY ARTERY BYPASS GRAFT  10/2003  . HERNIA REPAIR  1999   right, inguinal  . HERNIA REPAIR  2002   left, inguinal  . HIP SURGERY  2006   right hip  . PILONIDAL CYST EXCISION  1964  . prostate seed implant  3/12  . TONSILLECTOMY  childhood    Current Medications: Outpatient Medications Prior to Visit  Medication Sig Dispense Refill  . amoxicillin (AMOXIL) 500 MG capsule 4 tabs by mouth prior to dental procedure. 4 capsule 0  . Blood Glucose Monitoring Suppl (ONETOUCH VERIO FLEX SYSTEM) w/Device KIT 1 each by Does not apply route 2 (two) times daily. 1 kit 0  . fenofibrate (TRICOR) 145 MG tablet Take 1 tablet (145 mg total) by mouth daily. 90 tablet 1  . FLUZONE HIGH-DOSE 0.5 ML injection TO BE ADMINISTERED BY PHARMACIST FOR IMMUNIZATION  0  . furosemide (LASIX) 20 MG tablet Take 1 tablet (20 mg total) by mouth daily as needed. For swelling 90 tablet 1  . glucose blood (ONETOUCH VERIO) test strip Use as instructed to check blood sugar twice a day.  Dx  E11.8 200  each 1  . imiquimod (ALDARA) 5 % cream Apply topically 3 (three) times a week.    . Insulin Glargine (LANTUS SOLOSTAR) 100 UNIT/ML Solostar Pen Inject 41 Units into the skin daily at 10 pm. 5 pen 1  . Insulin Pen Needle (NOVOFINE PLUS) 32G X 4 MM MISC USE FOR INJECTIONS INTO THE SKIN EVERY EVENING 100 each 1  . metFORMIN (GLUCOPHAGE) 1000 MG tablet Take 1 tablet (1,000 mg total) by mouth 2 (two) times daily with a meal. 180 tablet 1  . metoprolol succinate (TOPROL-XL) 25 MG 24 hr tablet Take 0.5 tablets (12.5 mg total) by mouth daily. 45 tablet 1  . olmesartan-hydrochlorothiazide (BENICAR HCT) 20-12.5 MG tablet Take 0.5 tablets by mouth daily. 45 tablet 1  . Omega-3 Fatty Acids (FISH OIL)  1000 MG CAPS Take 1 capsule by mouth daily.     [name] Use to check blood sugar twice a day.  Dx  E11.8 200 each 1  . warfarin (COUMADIN) 7.5 MG tablet Take 1/2 to 1 tablet daily as directed by coumadin clinic 40 tablet 1  . atorvastatin (LIPITOR) 20 MG tablet TAKE ONE (1) TABLET BY MOUTH EVERY DAY 90 tablet 1   No facility-administered medications prior to visit.      Allergies:   Patient has no known allergies.   Social History   Socioeconomic History  . Marital status: Married    Spouse name: None  . Number of children: None  . Years of education: None  . Highest education level: None  Social Needs  . Financial resource strain: None  . Food insecurity - worry: None  . Food insecurity - inability: None  . Transportation needs - medical: None  . Transportation needs - non-medical: None  Occupational History  . None  Tobacco Use  . Smoking status: Former Smoker    Types: Cigars  . Smokeless tobacco: Never Used  . Tobacco comment: Never used Tobacco  Substance and Sexual Activity  . Alcohol use: Yes    Alcohol/week: 0.0 oz    Comment: 4 glasses wine a month  . Drug use: No  . Sexual activity: No  Other Topics Concern  . None  Social History Narrative  . None     Family History:  The patient's family history includes Asperger's syndrome in his son; Coronary artery disease in his brother; Heart disease in his father and mother; Hyperlipidemia in his son; Stroke in his mother.   ROS:   Please see the history of present illness.    ROS All other systems reviewed and are negative.   PHYSICAL EXAM:   VS:  BP 138/72   Pulse 62   Ht '5\' 10"'$  (1.778 m)   Wt 225 lb (102.1 kg)   BMI 32.28 kg/m    GEN: Well nourished, well developed, in no acute distress  HEENT: normal  Neck: no JVD, or masses  +murmur radiating up from aortic valve up bilaterally Cardiac: RRR; no rubs, or gallops,no edema  +mechanical click and 3/6 systolic  murmur Respiratory:  clear to auscultation bilaterally, normal work of breathing GI: soft, nontender, nondistended, + BS MS: no deformity or atrophy  Skin: warm and dry, no rash Neuro:  Alert and Oriented x 3, Strength and sensation are intact Psych: euthymic mood, full affect  Wt Readings from Last 3 Encounters:  12/08/16 225 lb (102.1 kg)  09/26/16 221 lb (100.2 kg)  07/25/16 223 lb 6.4 oz (101.3 kg)  Studies/Labs Reviewed:   EKG:  EKG is ordered today.  The ekg ordered today demonstrates normal sinus rhythm T wave inversion in V3 through V5. Unchanged when compared to the previous EKG in 2016.  Recent Labs: 03/30/2016: ALT 29; HGB 14.1; Platelets 285 07/31/2016: BUN 37; Creatinine, Ser 1.32; Potassium 4.0; Sodium 137   Lipid Panel    Component Value Date/Time   CHOL 137 09/26/2016 1143   TRIG 251.0 (H) 09/26/2016 1143   HDL 25.30 (L) 09/26/2016 1143   CHOLHDL 5 09/26/2016 1143   VLDL 50.2 (H) 09/26/2016 1143   LDLCALC 54 11/25/2010 1025   LDLDIRECT 71.0 09/26/2016 1143    Additional studies/ records that were reviewed today include:   Echo 02/05/2013 LV EF: 55% -  60%  Study Conclusions  - Left ventricle: The cavity size was normal. There was focal basal hypertrophy. Systolic function was normal. The estimated ejection fraction was in the range of 55% to 60%. Possible mild hypokinesis of the apical myocardium. Features are consistent with a pseudonormal left ventricular filling pattern, with concomitant abnormal relaxation and increased filling pressure (grade 2 diastolic dysfunction). - Aortic valve: A mechanical prosthesis was present and functioning normally. Trivial regurgitation. - Mitral valve: Mild to moderate regurgitation directed centrally.   ASSESSMENT:    1. H/O mechanical aortic valve replacement   2. Heart murmur   3. Coronary artery disease involving coronary bypass graft of native heart without angina pectoris   4.  Essential hypertension   5. Hyperlipidemia, unspecified hyperlipidemia type   6. Controlled type 2 diabetes mellitus without complication, with long-term current use of insulin (HCC)      PLAN:  In order of problems listed above:  1. AVR with mechanical aortic valve: Crisp mechanical click noted on physical exam, also has a 3/6 murmur as well. Will repeat echocardiogram.  2. CAD s/p CABG: last Myoview in 2016, no obvious angina symptom  3. Hypertension: blood pressure well-controlld  4. Hyperlipidemia: cholesterol has been elevated. Will increase Lipitor 40 mg daily, add Lovaza '1000mg'$  BID  5. DM 2: Managed by primary care provider, on insulin    Medication Adjustments/Labs and Tests Ordered: Current medicines are reviewed at length with the patient today.  Concerns regarding medicines are outlined above.  Medication changes, Labs and Tests ordered today are listed in the Patient Instructions below. Patient Instructions  Medication Instructions:  INCREASE Lipitor to 40 mg Take 1 tablet once a day START Lovaza take 1 tablet twice a day STOP Fish Oil  Labwork: None   Testing/Procedures: Your physician has requested that you have an echocardiogram. Echocardiography is a painless test that uses sound waves to create images of your heart. It provides your doctor with information about the size and shape of your heart and how well your heart's chambers and valves are working. This procedure takes approximately one hour. There are no restrictions for this procedure.  [place] [address]  Follow-Up: Your physician wants you to follow-up in: 6 months with Dr Stanford Breed. You will receive a reminder letter in the mail two months in advance. If you don't receive a letter, please call our office to schedule the follow-up appointment. Any Other Special Instructions Will Be Listed Below (If Applicable).  If you need a refill on your cardiac medications before your next appointment,  please call your pharmacy.     Hilbert Corrigan, [place]  12/10/2016 11:04 AM    [place],  [place]  [address] Phone: [phone]; Fax: [phone]

## 2016-12-08 NOTE — Patient Instructions (Addendum)
Medication Instructions:  INCREASE Lipitor to 40 mg Take 1 tablet once a day START Lovaza take 1 tablet twice a day STOP Fish Oil  Labwork: None   Testing/Procedures: Your physician has requested that you have an echocardiogram. Echocardiography is a painless test that uses sound waves to create images of your heart. It provides your doctor with information about the size and shape of your heart and how well your heart's chambers and valves are working. This procedure takes approximately one hour. There are no restrictions for this procedure.  [address] [address]  Follow-Up: Your physician wants you to follow-up in: 6 months with Dr Stanford Breed. You will receive a reminder letter in the mail two months in advance. If you don't receive a letter, please call our office to schedule the follow-up appointment. Any Other Special Instructions Will Be Listed Below (If Applicable).  If you need a refill on your cardiac medications before your next appointment, please call your pharmacy.

## 2016-12-10 ENCOUNTER — Encounter: Payer: Self-pay | Admitting: Physician Assistant

## 2016-12-11 ENCOUNTER — Encounter: Payer: Self-pay | Admitting: Family

## 2016-12-17 ENCOUNTER — Encounter: Payer: Self-pay | Admitting: Family

## 2016-12-17 DIAGNOSIS — Z6832 Body mass index (BMI) 32.0-32.9, adult: Secondary | ICD-10-CM | POA: Diagnosis not present

## 2016-12-17 DIAGNOSIS — Z952 Presence of prosthetic heart valve: Secondary | ICD-10-CM | POA: Diagnosis not present

## 2016-12-17 DIAGNOSIS — H9190 Unspecified hearing loss, unspecified ear: Secondary | ICD-10-CM | POA: Diagnosis not present

## 2016-12-17 DIAGNOSIS — R001 Bradycardia, unspecified: Secondary | ICD-10-CM | POA: Diagnosis not present

## 2016-12-17 DIAGNOSIS — R42 Dizziness and giddiness: Secondary | ICD-10-CM | POA: Diagnosis not present

## 2016-12-17 DIAGNOSIS — I440 Atrioventricular block, first degree: Secondary | ICD-10-CM | POA: Diagnosis not present

## 2016-12-17 DIAGNOSIS — Z79899 Other long term (current) drug therapy: Secondary | ICD-10-CM | POA: Diagnosis not present

## 2016-12-17 DIAGNOSIS — Z794 Long term (current) use of insulin: Secondary | ICD-10-CM | POA: Diagnosis not present

## 2016-12-17 DIAGNOSIS — H8110 Benign paroxysmal vertigo, unspecified ear: Secondary | ICD-10-CM | POA: Diagnosis not present

## 2016-12-17 DIAGNOSIS — E669 Obesity, unspecified: Secondary | ICD-10-CM | POA: Diagnosis not present

## 2016-12-17 DIAGNOSIS — R112 Nausea with vomiting, unspecified: Secondary | ICD-10-CM | POA: Diagnosis not present

## 2016-12-17 DIAGNOSIS — I10 Essential (primary) hypertension: Secondary | ICD-10-CM | POA: Diagnosis not present

## 2016-12-17 DIAGNOSIS — E119 Type 2 diabetes mellitus without complications: Secondary | ICD-10-CM | POA: Diagnosis not present

## 2016-12-17 DIAGNOSIS — R1110 Vomiting, unspecified: Secondary | ICD-10-CM | POA: Diagnosis not present

## 2016-12-17 DIAGNOSIS — Z7901 Long term (current) use of anticoagulants: Secondary | ICD-10-CM | POA: Diagnosis not present

## 2016-12-18 ENCOUNTER — Encounter: Payer: Self-pay | Admitting: Family

## 2016-12-18 DIAGNOSIS — H8110 Benign paroxysmal vertigo, unspecified ear: Secondary | ICD-10-CM | POA: Diagnosis not present

## 2016-12-18 DIAGNOSIS — E119 Type 2 diabetes mellitus without complications: Secondary | ICD-10-CM | POA: Diagnosis not present

## 2016-12-18 NOTE — Telephone Encounter (Signed)
[name],   Could you please request hospitalization records and complete TCM follow up/schedule OV with me for next week?  Thanks,  [employer]

## 2016-12-19 ENCOUNTER — Ambulatory Visit (HOSPITAL_COMMUNITY): Payer: Medicare HMO | Attending: Cardiology

## 2016-12-19 ENCOUNTER — Ambulatory Visit (INDEPENDENT_AMBULATORY_CARE_PROVIDER_SITE_OTHER): Payer: Medicare HMO | Admitting: *Deleted

## 2016-12-19 ENCOUNTER — Other Ambulatory Visit: Payer: Self-pay

## 2016-12-19 DIAGNOSIS — Z952 Presence of prosthetic heart valve: Secondary | ICD-10-CM | POA: Insufficient documentation

## 2016-12-19 DIAGNOSIS — I071 Rheumatic tricuspid insufficiency: Secondary | ICD-10-CM | POA: Diagnosis not present

## 2016-12-19 DIAGNOSIS — I359 Nonrheumatic aortic valve disorder, unspecified: Secondary | ICD-10-CM | POA: Diagnosis not present

## 2016-12-19 DIAGNOSIS — R011 Cardiac murmur, unspecified: Secondary | ICD-10-CM | POA: Diagnosis not present

## 2016-12-19 DIAGNOSIS — I420 Dilated cardiomyopathy: Secondary | ICD-10-CM | POA: Insufficient documentation

## 2016-12-19 DIAGNOSIS — I5030 Unspecified diastolic (congestive) heart failure: Secondary | ICD-10-CM | POA: Diagnosis not present

## 2016-12-19 DIAGNOSIS — Z5181 Encounter for therapeutic drug level monitoring: Secondary | ICD-10-CM

## 2016-12-19 LAB — POCT INR: INR: 1.8

## 2016-12-19 NOTE — Patient Instructions (Addendum)
Today take 1 tablet then continue on same dosage of 1 tablet daily except 1/2 tablet on Tuesdays, Thursdays, Saturdays.  Recheck INR in 2 weeks.

## 2016-12-25 ENCOUNTER — Encounter: Payer: Self-pay | Admitting: Family

## 2016-12-26 ENCOUNTER — Encounter: Payer: Self-pay | Admitting: Family

## 2016-12-26 ENCOUNTER — Ambulatory Visit (INDEPENDENT_AMBULATORY_CARE_PROVIDER_SITE_OTHER): Payer: Medicare HMO | Admitting: Family

## 2016-12-26 ENCOUNTER — Telehealth: Payer: Self-pay | Admitting: *Deleted

## 2016-12-26 VITALS — BP 118/62 | HR 57 | Temp 97.8°F | Resp 16 | Ht 70.0 in | Wt 223.8 lb

## 2016-12-26 DIAGNOSIS — H8110 Benign paroxysmal vertigo, unspecified ear: Secondary | ICD-10-CM

## 2016-12-26 MED ORDER — MECLIZINE HCL 25 MG PO TABS: 25.0000 mg | ORAL_TABLET | Freq: Three times a day (TID) | ORAL | 2 refills | Status: DC | PRN

## 2016-12-26 NOTE — Progress Notes (Signed)
Subjective:    Patient ID: [name], male    DOB: Aug 17, 1945, 71 y.o.   MRN: [id number]  HPI  Pt is a 71 yr old male who presents today for ED follow up. Woke up Sunday before Thanksgiving. Reports that he laid down and room kept spinning. Was in a hotel.  Paramedics were called.  Reports that he had diaphoresis on hi forehead during this event. Went to the hospital via ambulance.  Had severe nausea/dry heaves.    He reports that he had a CT of his head which was reportedly negative. Was also given meclizine and diagnosed with vertigo.     Since he has been home he reports that he has been taking meclizine tid and symptoms are improved.  Not completely resolved.  Has refills.    Review of Systems See hpi  Past Medical History:  Diagnosis Date  . Allergy   . Aortic stenosis   . CAD (coronary artery disease)    cabg  . Cancer [facility]) 2011   prostate  . Erectile dysfunction   . Heart murmur   . Hemochromatosis 02/14/2011  . Hemorrhoids   . History of gout   . History of hepatitis 1974  . Hyperlipidemia   . Hypertension   . Hypertr obst cardiomyop   . Obesity    moderate  . Sensorineural hearing loss   . Type II or unspecified type diabetes mellitus without mention of complication, not stated as uncontrolled      Social History   Socioeconomic History  . Marital status: Married    Spouse name: Not on file  . Number of children: Not on file  . Years of education: Not on file  . Highest education level: Not on file  Social Needs  . Financial resource strain: Not on file  . Food insecurity - worry: Not on file  . Food insecurity - inability: Not on file  . Transportation needs - medical: Not on file  . Transportation needs - non-medical: Not on file  Occupational History  . Not on file  Tobacco Use  . Smoking status: Former Smoker    Types: Cigars  . Smokeless tobacco: Never Used  . Tobacco comment: Never used Tobacco  Substance and Sexual Activity  .  Alcohol use: Yes    Alcohol/week: 0.0 oz    Comment: 4 glasses wine a month  . Drug use: No  . Sexual activity: No  Other Topics Concern  . Not on file  Social History Narrative  . Not on file    Past Surgical History:  Procedure Laterality Date  . 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 APPENDECTOMY  1990  . CHOLECYSTECTOMY  1990  . CORONARY ARTERY BYPASS GRAFT  10/2003  . HERNIA REPAIR  1999   right, inguinal  . HERNIA REPAIR  2002   left, inguinal  . HIP SURGERY  2006   right hip  . PILONIDAL CYST EXCISION  1964  . prostate seed implant  3/12  . TONSILLECTOMY  childhood    Family History  Problem Relation Age of Onset  . Heart disease Mother   . Stroke Mother   . Heart disease Father   . Asperger's syndrome Son   . Hyperlipidemia Son   . Coronary artery disease Brother   . Cancer Neg Hx        negative for colon cancer    No Known Allergies  Current Outpatient Medications on  File Prior to Visit  Medication Sig Dispense Refill  . amoxicillin (AMOXIL) 500 MG capsule 4 tabs by mouth prior to dental procedure. 4 capsule 0  . atorvastatin (LIPITOR) 40 MG tablet TAKE ONE (1) TABLET BY MOUTH EVERY DAY 30 tablet 6  . Blood Glucose Monitoring Suppl (ONETOUCH VERIO FLEX SYSTEM) w/Device KIT 1 each by Does not apply route 2 (two) times daily. 1 kit 0  . fenofibrate (TRICOR) 145 MG tablet Take 1 tablet (145 mg total) by mouth daily. 90 tablet 1  . FLUZONE HIGH-DOSE 0.5 ML injection TO BE ADMINISTERED BY PHARMACIST FOR IMMUNIZATION  0  . furosemide (LASIX) 20 MG tablet Take 1 tablet (20 mg total) by mouth daily as needed. For swelling 90 tablet 1  . glucose blood (ONETOUCH VERIO) test strip Use as instructed to check blood sugar twice a day.  Dx  E11.8 200 each 1  . imiquimod (ALDARA) 5 % cream Apply topically 3 (three) times a week.    . Insulin Glargine (LANTUS SOLOSTAR) 100 UNIT/ML Solostar Pen Inject 41 Units into the skin daily at 10 pm. 5 pen 1  . Insulin Pen  Needle (NOVOFINE PLUS) 32G X 4 MM MISC USE FOR INJECTIONS INTO THE SKIN EVERY EVENING 100 each 1  . metFORMIN (GLUCOPHAGE) 1000 MG tablet Take 1 tablet (1,000 mg total) by mouth 2 (two) times daily with a meal. 180 tablet 1  . metoprolol succinate (TOPROL-XL) 25 MG 24 hr tablet Take 0.5 tablets (12.5 mg total) by mouth daily. 45 tablet 1  . olmesartan-hydrochlorothiazide (BENICAR HCT) 20-12.5 MG tablet Take 0.5 tablets by mouth daily. 45 tablet 1  . omega-3 acid ethyl esters (LOVAZA) 1 g capsule Take 1 capsule (1 g total) 2 (two) times daily by mouth. 60 capsule 6  . ONETOUCH DELICA LANCETS FINE MISC Use to check blood sugar twice a day.  Dx  E11.8 200 each 1  . warfarin (COUMADIN) 7.5 MG tablet Take 1/2 to 1 tablet daily as directed by coumadin clinic 40 tablet 1  . Omega-3 Fatty Acids (FISH OIL) 1000 MG CAPS Take 1 capsule by mouth daily.      No current facility-administered medications on file prior to visit.     BP 118/62 (BP Location: Right Arm, Patient Position: Sitting, Cuff Size: Large)   Pulse (!) 57   Temp 97.8 F (36.6 C) (Oral)   Resp 16   Ht 5' 10"  (1.778 m)   Wt 223 lb 12.8 oz (101.5 kg)   SpO2 97%   BMI 32.11 kg/m       Objective:   Physical Exam  Constitutional: He is oriented to person, place, and time. He appears well-developed and well-nourished. No distress.  HENT:  Head: Normocephalic and atraumatic.  Eyes: Pupils are equal, round, and reactive to light.  Cardiovascular: Normal rate and regular rhythm.  No murmur heard. Pulmonary/Chest: Effort normal and breath sounds normal. No respiratory distress. He has no wheezes. He has no rales.  Musculoskeletal: He exhibits no edema.  Neurological: He is alert and oriented to person, place, and time. No cranial nerve deficit. He exhibits normal muscle tone. Coordination normal.  Skin: Skin is warm and dry.  Psychiatric: He has a normal mood and affect. His behavior is normal. Thought content normal.            Assessment & Plan:  BPV- improving, refill sent for prn meclizine.  Pt is advised to let me know if symptoms worsen or fail to improve.  Would plan referral to vestibular rehab at that time.  Will request records from his ED visit.

## 2016-12-26 NOTE — Telephone Encounter (Signed)
[name], please see info re: records release. Thanks!

## 2016-12-26 NOTE — Telephone Encounter (Signed)
Records release faxed to [facility] dates of service 12/17/16 to 12/18/16. Release faxed to [phone]. Awaiting Records.

## 2016-12-26 NOTE — Patient Instructions (Signed)
Continue meclizine as needed. Let me know if your vertigo symptoms worsen or if they do not continue to improve.

## 2016-12-26 NOTE — Progress Notes (Signed)
1 

## 2016-12-27 NOTE — Telephone Encounter (Signed)
Records release was faxed on 12/26/16 and confirmation received.

## 2016-12-29 ENCOUNTER — Other Ambulatory Visit: Payer: Self-pay | Admitting: Family

## 2016-12-29 ENCOUNTER — Encounter: Payer: Self-pay | Admitting: Family

## 2016-12-29 DIAGNOSIS — R69 Illness, unspecified: Secondary | ICD-10-CM | POA: Diagnosis not present

## 2016-12-30 ENCOUNTER — Encounter: Payer: Self-pay | Admitting: Physician Assistant

## 2016-12-30 ENCOUNTER — Encounter: Payer: Self-pay | Admitting: Family

## 2017-01-01 NOTE — Telephone Encounter (Signed)
[name]-- I told pt to scan above documents to mychart. Please advise re: blood sugar readings?

## 2017-01-02 ENCOUNTER — Ambulatory Visit (INDEPENDENT_AMBULATORY_CARE_PROVIDER_SITE_OTHER): Payer: Medicare HMO | Admitting: *Deleted

## 2017-01-02 DIAGNOSIS — Z5181 Encounter for therapeutic drug level monitoring: Secondary | ICD-10-CM

## 2017-01-02 DIAGNOSIS — Z952 Presence of prosthetic heart valve: Secondary | ICD-10-CM | POA: Diagnosis not present

## 2017-01-02 DIAGNOSIS — I359 Nonrheumatic aortic valve disorder, unspecified: Secondary | ICD-10-CM

## 2017-01-02 LAB — POCT INR: INR: 2.1

## 2017-01-02 NOTE — Patient Instructions (Signed)
Continue on same dosage of 1 tablet daily except 1/2 tablet on Tuesdays, Thursdays, Saturdays.  Recheck INR in 3 weeks.

## 2017-01-09 ENCOUNTER — Ambulatory Visit: Payer: Medicare HMO | Admitting: Family

## 2017-01-17 ENCOUNTER — Other Ambulatory Visit: Payer: Self-pay | Admitting: Family

## 2017-01-17 ENCOUNTER — Encounter: Payer: Self-pay | Admitting: Family

## 2017-01-17 ENCOUNTER — Other Ambulatory Visit: Payer: Self-pay | Admitting: Cardiology

## 2017-01-17 ENCOUNTER — Ambulatory Visit (INDEPENDENT_AMBULATORY_CARE_PROVIDER_SITE_OTHER): Payer: Medicare HMO | Admitting: Family

## 2017-01-17 VITALS — BP 114/59 | HR 56 | Temp 98.0°F | Resp 16 | Ht 70.0 in | Wt 223.0 lb

## 2017-01-17 DIAGNOSIS — R42 Dizziness and giddiness: Secondary | ICD-10-CM

## 2017-01-17 DIAGNOSIS — E785 Hyperlipidemia, unspecified: Secondary | ICD-10-CM | POA: Diagnosis not present

## 2017-01-17 DIAGNOSIS — E119 Type 2 diabetes mellitus without complications: Secondary | ICD-10-CM | POA: Diagnosis not present

## 2017-01-17 NOTE — Progress Notes (Signed)
Subjective:    Patient ID: [name], male    DOB: [birth year], 71 y.o.   MRN: [id number]        Mr. Fahrner is a 71 yr old male who presents today for follow up of his dizziness. Reports that his vertigo symptoms are much improved. Just tries not to turn head real fast. He is taking meclizine bid instead of TID, plans to drop down to 1 a day.    Diabetes type 2-she has been working hard on improving his diet.  His home blood sugars have been stable less than 150.  Hyperlipidemia-patient request follow-up lipid panel today to see how his diet is helping.  He is maintained on a statin.  Review of Systems See HPI  Past Medical History:  Diagnosis Date  . Allergy   . Aortic stenosis   . CAD (coronary artery disease)    cabg  . Cancer [facility]) 2011   prostate  . Erectile dysfunction   . Heart murmur   . Hemochromatosis 02/14/2011  . Hemorrhoids   . History of gout   . History of hepatitis 1974  . Hyperlipidemia   . Hypertension   . Hypertr obst cardiomyop   . Obesity    moderate  . Sensorineural hearing loss   . Type II or unspecified type diabetes mellitus without mention of complication, not stated as uncontrolled      Social History   Socioeconomic History  . Marital status: Married    Spouse name: Not on file  . Number of children: Not on file  . Years of education: Not on file  . Highest education level: Not on file  Social Needs  . Financial resource strain: Not on file  . Food insecurity - worry: Not on file  . Food insecurity - inability: Not on file  . Transportation needs - medical: Not on file  . Transportation needs - non-medical: Not on file  Occupational History  . Not on file  Tobacco Use  . Smoking status: Former Smoker    Types: Cigars  . Smokeless tobacco: Never Used  . Tobacco comment: Never used Tobacco  Substance and Sexual Activity  . Alcohol use: Yes    Alcohol/week: 0.0 oz    Comment: 4 glasses wine a month  . Drug use: No  .  Sexual activity: No  Other Topics Concern  . Not on file  Social History Narrative  . Not on file    Past Surgical History:  Procedure Laterality Date  . 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 APPENDECTOMY  1990  . CHOLECYSTECTOMY  1990  . CORONARY ARTERY BYPASS GRAFT  10/2003  . HERNIA REPAIR  1999   right, inguinal  . HERNIA REPAIR  2002   left, inguinal  . HIP SURGERY  2006   right hip  . PILONIDAL CYST EXCISION  1964  . prostate seed implant  3/12  . TONSILLECTOMY  childhood    Family History  Problem Relation Age of Onset  . Heart disease Mother   . Stroke Mother   . Heart disease Father   . Asperger's syndrome Son   . Hyperlipidemia Son   . Coronary artery disease Brother   . Cancer Neg Hx        negative for colon cancer    No Known Allergies  Current Outpatient Medications on File Prior to Visit  Medication Sig Dispense Refill  . amoxicillin (AMOXIL) 500 MG capsule  4 tabs by mouth prior to dental procedure. 4 capsule 0  . atorvastatin (LIPITOR) 40 MG tablet TAKE ONE (1) TABLET BY MOUTH EVERY DAY 30 tablet 6  . Blood Glucose Monitoring Suppl (ONETOUCH VERIO FLEX SYSTEM) w/Device KIT 1 each by Does not apply route 2 (two) times daily. 1 kit 0  . fenofibrate (TRICOR) 145 MG tablet Take 1 tablet (145 mg total) by mouth daily. 90 tablet 1  . FLUZONE HIGH-DOSE 0.5 ML injection TO BE ADMINISTERED BY PHARMACIST FOR IMMUNIZATION  0  . furosemide (LASIX) 20 MG tablet Take 1 tablet (20 mg total) by mouth daily as needed. For swelling 90 tablet 1  . imiquimod (ALDARA) 5 % cream Apply topically 3 (three) times a week.    . Insulin Glargine (LANTUS SOLOSTAR) 100 UNIT/ML Solostar Pen Inject 41 Units into the skin daily at 10 pm. 5 pen 1  . Insulin Pen Needle (NOVOFINE PLUS) 32G X 4 MM MISC USE FOR INJECTIONS INTO THE SKIN EVERY EVENING 100 each 1  . meclizine (ANTIVERT) 25 MG tablet Take 1 tablet (25 mg total) by mouth 3 (three) times daily as needed for dizziness.  30 tablet 2  . metFORMIN (GLUCOPHAGE) 1000 MG tablet Take 1 tablet (1,000 mg total) by mouth 2 (two) times daily with a meal. 180 tablet 1  . metoprolol succinate (TOPROL-XL) 25 MG 24 hr tablet Take 0.5 tablets (12.5 mg total) by mouth daily. 45 tablet 1  . olmesartan-hydrochlorothiazide (BENICAR HCT) 20-12.5 MG tablet Take 0.5 tablets by mouth daily. 45 tablet 1  . omega-3 acid ethyl esters (LOVAZA) 1 g capsule Take 1 capsule (1 g total) 2 (two) times daily by mouth. 60 capsule 6  . Omega-3 Fatty Acids (FISH OIL) 1000 MG CAPS Take 1 capsule by mouth daily.     [name] Use to check blood sugar twice a day.  Dx  E11.8 200 each 1  . ONETOUCH VERIO test strip USE AS INSTRUCTED TO CHECK BLOOD SUGAR TWICE A DAY. DX E11.8 200 each 1  . warfarin (COUMADIN) 7.5 MG tablet Take 1/2 to 1 tablet daily as directed by coumadin clinic 40 tablet 1   No current facility-administered medications on file prior to visit.     BP (!) 114/59 (BP Location: Right Arm, Patient Position: Sitting, Cuff Size: Large)   Pulse (!) 56   Temp 98 F (36.7 C) (Oral)   Resp 16   Ht 5' 10"  (1.778 m)   Wt 223 lb (101.2 kg)   SpO2 98%   BMI 32.00 kg/m       Objective:   Physical Exam  Constitutional: He is oriented to person, place, and time.  HENT:  Head: Normocephalic and atraumatic.  Musculoskeletal: He exhibits edema.  Neurological: He is alert and oriented to person, place, and time.  Skin: Skin is warm and dry.  Psychiatric: He has a normal mood and affect. His behavior is normal. Judgment and thought content normal.          Assessment & Plan:  Vertigo -Reports improvement in symptoms.  I have advised the patient to let me know if symptoms worsen or if they fail to continue to improve.  Diabetes type 2-improved.  Obtain follow-up A1c.  Hyperlipidemia-tolerating statin.  Obtain lipid panel.

## 2017-01-17 NOTE — Patient Instructions (Signed)
Please schedule lab visit tomorrow AM fasting.

## 2017-01-18 ENCOUNTER — Other Ambulatory Visit (INDEPENDENT_AMBULATORY_CARE_PROVIDER_SITE_OTHER): Payer: Medicare HMO

## 2017-01-18 DIAGNOSIS — E785 Hyperlipidemia, unspecified: Secondary | ICD-10-CM | POA: Diagnosis not present

## 2017-01-18 DIAGNOSIS — E119 Type 2 diabetes mellitus without complications: Secondary | ICD-10-CM | POA: Diagnosis not present

## 2017-01-18 LAB — COMPREHENSIVE METABOLIC PANEL
ALBUMIN: 4.4 g/dL (ref 3.5–5.2)
ALT: 19 U/L (ref 0–53)
AST: 27 U/L (ref 0–37)
Alkaline Phosphatase: 35 U/L — ABNORMAL LOW (ref 39–117)
BUN: 34 mg/dL — AB (ref 6–23)
CALCIUM: 9.2 mg/dL (ref 8.4–10.5)
CHLORIDE: 102 meq/L (ref 96–112)
CO2: 27 mEq/L (ref 19–32)
Creatinine, Ser: 1.63 mg/dL — ABNORMAL HIGH (ref 0.40–1.50)
GFR: 44.44 mL/min — ABNORMAL LOW (ref >60.00)
Glucose, Bld: 104 mg/dL — ABNORMAL HIGH (ref 70–99)
POTASSIUM: 4.4 meq/L (ref 3.5–5.1)
SODIUM: 138 meq/L (ref 135–145)
Total Bilirubin: 0.5 mg/dL (ref 0.2–1.2)
Total Protein: 7.7 g/dL (ref 6.0–8.3)

## 2017-01-18 LAB — LIPID PANEL
CHOL/HDL RATIO: 4
Cholesterol: 111 mg/dL (ref 0–200)
HDL: 28.5 mg/dL — AB (ref >39.00)
NonHDL: 82.74
Triglycerides: 224 mg/dL — ABNORMAL HIGH (ref 0.0–149.0)
VLDL: 44.8 mg/dL — AB (ref 0.0–40.0)

## 2017-01-18 LAB — HEMOGLOBIN A1C: HEMOGLOBIN A1C: 7.1 % — AB (ref 4.6–6.5)

## 2017-01-18 LAB — LDL CHOLESTEROL, DIRECT: LDL DIRECT: 61 mg/dL

## 2017-01-20 ENCOUNTER — Encounter: Payer: Self-pay | Admitting: Family

## 2017-01-20 DIAGNOSIS — N289 Disorder of kidney and ureter, unspecified: Secondary | ICD-10-CM

## 2017-01-25 ENCOUNTER — Other Ambulatory Visit: Payer: Self-pay

## 2017-01-25 ENCOUNTER — Other Ambulatory Visit: Payer: Medicare HMO

## 2017-01-25 ENCOUNTER — Other Ambulatory Visit (INDEPENDENT_AMBULATORY_CARE_PROVIDER_SITE_OTHER): Payer: Medicare HMO

## 2017-01-25 DIAGNOSIS — N289 Disorder of kidney and ureter, unspecified: Secondary | ICD-10-CM

## 2017-01-25 LAB — BASIC METABOLIC PANEL
BUN: 35 mg/dL — AB (ref 6–23)
CHLORIDE: 103 meq/L (ref 96–112)
CO2: 25 meq/L (ref 19–32)
CREATININE: 1.5 mg/dL (ref 0.40–1.50)
Calcium: 9.1 mg/dL (ref 8.4–10.5)
GFR: 48.92 mL/min — ABNORMAL LOW (ref >60.00)
Glucose, Bld: 133 mg/dL — ABNORMAL HIGH (ref 70–99)
Potassium: 4.2 mEq/L (ref 3.5–5.1)
Sodium: 136 mEq/L (ref 135–145)

## 2017-01-25 MED ORDER — MECLIZINE HCL 25 MG PO TABS: 25.0000 mg | ORAL_TABLET | Freq: Three times a day (TID) | ORAL | 0 refills | Status: DC | PRN

## 2017-01-26 ENCOUNTER — Encounter: Payer: Self-pay | Admitting: Family

## 2017-01-26 ENCOUNTER — Telehealth: Payer: Self-pay | Admitting: *Deleted

## 2017-01-26 ENCOUNTER — Ambulatory Visit (INDEPENDENT_AMBULATORY_CARE_PROVIDER_SITE_OTHER): Payer: Medicare HMO | Admitting: *Deleted

## 2017-01-26 ENCOUNTER — Other Ambulatory Visit: Payer: Self-pay | Admitting: Family

## 2017-01-26 DIAGNOSIS — Z5181 Encounter for therapeutic drug level monitoring: Secondary | ICD-10-CM

## 2017-01-26 DIAGNOSIS — Z952 Presence of prosthetic heart valve: Secondary | ICD-10-CM

## 2017-01-26 DIAGNOSIS — I359 Nonrheumatic aortic valve disorder, unspecified: Secondary | ICD-10-CM | POA: Diagnosis not present

## 2017-01-26 LAB — POCT INR: INR: 2.2

## 2017-01-26 MED ORDER — METFORMIN HCL 1000 MG PO TABS: 500.0000 mg | ORAL_TABLET | Freq: Two times a day (BID) | ORAL | 1 refills | Status: DC

## 2017-01-26 NOTE — Patient Instructions (Signed)
Description   Continue on same dosage of 1 tablet daily except 1/2 tablet on Tuesdays, Thursdays, Saturdays.  Recheck INR in 4 weeks.

## 2017-01-26 NOTE — Telephone Encounter (Signed)
Received Medical records from [facility]; forwarded to provider/SLS 12/28

## 2017-01-28 ENCOUNTER — Encounter: Payer: Self-pay | Admitting: Family

## 2017-02-01 ENCOUNTER — Telehealth: Payer: Self-pay | Admitting: *Deleted

## 2017-02-01 NOTE — Telephone Encounter (Signed)
Received Medical Records from [facility] via mail, forwarded to provider/SLS 01/03

## 2017-02-11 ENCOUNTER — Encounter: Payer: Self-pay | Admitting: Family

## 2017-02-14 MED ORDER — BASAGLAR KWIKPEN 100 UNIT/ML ~~LOC~~ SOPN: 41.0000 [IU] | PEN_INJECTOR | Freq: Every day | SUBCUTANEOUS | 5 refills | Status: DC

## 2017-02-14 NOTE — Addendum Note (Signed)
Addended by: [name] on: 02/14/2017 07:37 AM   Modules accepted: Orders

## 2017-02-22 ENCOUNTER — Ambulatory Visit (INDEPENDENT_AMBULATORY_CARE_PROVIDER_SITE_OTHER): Payer: Medicare HMO | Admitting: Pharmacist

## 2017-02-22 DIAGNOSIS — Z952 Presence of prosthetic heart valve: Secondary | ICD-10-CM | POA: Diagnosis not present

## 2017-02-22 DIAGNOSIS — I359 Nonrheumatic aortic valve disorder, unspecified: Secondary | ICD-10-CM | POA: Diagnosis not present

## 2017-02-22 LAB — POCT INR: INR: 1.9

## 2017-02-22 NOTE — Patient Instructions (Signed)
Description   Take 1 tablet today, then continue on same dosage of 1 tablet daily except 1/2 tablet on Tuesdays, Thursdays, Saturdays.  Recheck INR in 4 weeks.

## 2017-03-01 ENCOUNTER — Encounter: Payer: Self-pay | Admitting: Family

## 2017-03-20 DIAGNOSIS — D225 Melanocytic nevi of trunk: Secondary | ICD-10-CM | POA: Diagnosis not present

## 2017-03-20 DIAGNOSIS — Z08 Encounter for follow-up examination after completed treatment for malignant neoplasm: Secondary | ICD-10-CM | POA: Diagnosis not present

## 2017-03-20 DIAGNOSIS — L814 Other melanin hyperpigmentation: Secondary | ICD-10-CM | POA: Diagnosis not present

## 2017-03-20 DIAGNOSIS — Z85828 Personal history of other malignant neoplasm of skin: Secondary | ICD-10-CM | POA: Diagnosis not present

## 2017-03-20 DIAGNOSIS — L570 Actinic keratosis: Secondary | ICD-10-CM | POA: Diagnosis not present

## 2017-03-21 ENCOUNTER — Telehealth: Payer: Self-pay | Admitting: Family

## 2017-03-21 DIAGNOSIS — H524 Presbyopia: Secondary | ICD-10-CM | POA: Diagnosis not present

## 2017-03-21 LAB — HM DIABETES EYE EXAM

## 2017-03-21 NOTE — Telephone Encounter (Signed)
Copied from [place]. Topic: Quick Communication - See Telephone Encounter >> Mar 21, 2017 11:27 AM Rosalin Hawking wrote: CRM for notification. See Telephone encounter for:  03/21/17.   Pt dropped off document for provider to see and have on file for pt (Living Will and [place]) Document put at front office tray under providers name.

## 2017-03-22 ENCOUNTER — Ambulatory Visit: Payer: Medicare HMO | Admitting: *Deleted

## 2017-03-22 DIAGNOSIS — Z952 Presence of prosthetic heart valve: Secondary | ICD-10-CM

## 2017-03-22 DIAGNOSIS — Z5181 Encounter for therapeutic drug level monitoring: Secondary | ICD-10-CM

## 2017-03-22 DIAGNOSIS — I359 Nonrheumatic aortic valve disorder, unspecified: Secondary | ICD-10-CM

## 2017-03-22 LAB — POCT INR: INR: 2.2

## 2017-03-22 NOTE — Patient Instructions (Signed)
Description   Continue on same dosage of 1 tablet daily except 1/2 tablet on Tuesdays, Thursdays, Saturdays.  Recheck INR in 4 weeks.

## 2017-03-26 NOTE — Telephone Encounter (Signed)
Advanced directive abstracted and given to front office to scan to record.

## 2017-03-29 ENCOUNTER — Inpatient Hospital Stay: Payer: Medicare HMO | Attending: Hematology & Oncology

## 2017-03-29 ENCOUNTER — Encounter: Payer: Self-pay | Admitting: Family

## 2017-03-29 ENCOUNTER — Inpatient Hospital Stay (HOSPITAL_BASED_OUTPATIENT_CLINIC_OR_DEPARTMENT_OTHER): Payer: Medicare HMO | Admitting: Hematology & Oncology

## 2017-03-29 DIAGNOSIS — Z923 Personal history of irradiation: Secondary | ICD-10-CM | POA: Insufficient documentation

## 2017-03-29 DIAGNOSIS — Z8546 Personal history of malignant neoplasm of prostate: Secondary | ICD-10-CM | POA: Insufficient documentation

## 2017-03-29 DIAGNOSIS — Z79899 Other long term (current) drug therapy: Secondary | ICD-10-CM | POA: Insufficient documentation

## 2017-03-29 DIAGNOSIS — E119 Type 2 diabetes mellitus without complications: Secondary | ICD-10-CM

## 2017-03-29 DIAGNOSIS — Z794 Long term (current) use of insulin: Secondary | ICD-10-CM | POA: Insufficient documentation

## 2017-03-29 LAB — CBC WITH DIFFERENTIAL (CANCER CENTER ONLY)
BASOS PCT: 1 %
Basophils Absolute: 0.1 10*3/uL (ref 0.0–0.1)
EOS ABS: 0.2 10*3/uL (ref 0.0–0.5)
Eosinophils Relative: 3 %
HCT: 35.9 % — ABNORMAL LOW (ref 38.7–49.9)
Hemoglobin: 12.3 g/dL — ABNORMAL LOW (ref 13.0–17.1)
Lymphocytes Relative: 21 %
Lymphs Abs: 1.7 10*3/uL (ref 0.9–3.3)
MCH: 31.1 pg (ref 28.0–33.4)
MCHC: 34.3 g/dL (ref 32.0–35.9)
MCV: 90.7 fL (ref 82.0–98.0)
MONO ABS: 0.6 10*3/uL (ref 0.1–0.9)
MONOS PCT: 7 %
NEUTROS ABS: 5.5 10*3/uL (ref 1.5–6.5)
Neutrophils Relative %: 68 %
Platelet Count: 237 10*3/uL (ref 145–400)
RBC: 3.96 MIL/uL — ABNORMAL LOW (ref 4.20–5.70)
RDW: 12.5 % (ref 11.1–15.7)
WBC Count: 8 10*3/uL (ref 4.0–10.0)

## 2017-03-29 NOTE — Progress Notes (Signed)
Hematology and Oncology Follow Up Visit  [name] [id number] July 03, 1945 72 y.o. 03/29/2017   Principle Diagnosis:  1. Hemochromatosis (H63D homozygous mutation). 2. Stage I prostate cancer, status post radiation therapy and     radiation seeds.  Current Therapy:    Phlebotomy to maintain a ferritin less than 100      Interim History:  Mr.  [name] is back for followup.  He is doing quite well.  We see him every 6 months.  He has had no problems overall.  He is trying to lose a little bit of weight.  He is diabetic.  He does a lot of the cooking.  He is doing his best to try to watch what he eats.  When we last checked his iron back in March 2018, his ferritin was 69 with an iron saturation of 17%.  He has had no fever.  He has had no problems with nausea or vomiting.  He has had no leg swelling.  He has had no tingling in the hands or feet.  Overall, his performance status is ECOG 1.    Medications:  Current Outpatient Medications:  .  amoxicillin (AMOXIL) 500 MG capsule, 4 tabs by mouth prior to dental procedure., Disp: 4 capsule, Rfl: 0 .  atorvastatin (LIPITOR) 40 MG tablet, TAKE ONE (1) TABLET BY MOUTH EVERY DAY, Disp: 30 tablet, Rfl: 6 .  Blood Glucose Monitoring Suppl (ONETOUCH VERIO FLEX SYSTEM) w/Device KIT, 1 each by Does not apply route 2 (two) times daily., Disp: 1 kit, Rfl: 0 .  fenofibrate (TRICOR) 145 MG tablet, Take 1 tablet (145 mg total) by mouth daily., Disp: 90 tablet, Rfl: 1 .  FLUZONE HIGH-DOSE 0.5 ML injection, TO BE ADMINISTERED BY PHARMACIST FOR IMMUNIZATION, Disp: , Rfl: 0 .  furosemide (LASIX) 20 MG tablet, Take 1 tablet (20 mg total) by mouth daily as needed. For swelling, Disp: 90 tablet, Rfl: 1 .  imiquimod (ALDARA) 5 % cream, Apply topically 3 (three) times a week., Disp: , Rfl:  .  Insulin Glargine (BASAGLAR KWIKPEN) 100 UNIT/ML SOPN, Inject 0.41 mLs (41 Units total) into the skin at bedtime., Disp: 5 pen, Rfl: 5 .  Insulin Pen Needle  (NOVOFINE PLUS) 32G X 4 MM MISC, USE FOR INJECTIONS INTO THE SKIN EVERY EVENING, Disp: 100 each, Rfl: 1 .  meclizine (ANTIVERT) 25 MG tablet, Take 1 tablet (25 mg total) by mouth 3 (three) times daily as needed for dizziness., Disp: 90 tablet, Rfl: 0 .  metFORMIN (GLUCOPHAGE) 1000 MG tablet, Take 0.5 tablets (500 mg total) by mouth 2 (two) times daily with a meal., Disp: 180 tablet, Rfl: 1 .  metoprolol succinate (TOPROL-XL) 25 MG 24 hr tablet, Take 0.5 tablets (12.5 mg total) by mouth daily., Disp: 45 tablet, Rfl: 1 .  olmesartan-hydrochlorothiazide (BENICAR HCT) 20-12.5 MG tablet, Take 0.5 tablets by mouth daily., Disp: 45 tablet, Rfl: 1 .  omega-3 acid ethyl esters (LOVAZA) 1 g capsule, Take 1 capsule (1 g total) 2 (two) times daily by mouth., Disp: 60 capsule, Rfl: 6 .  Omega-3 Fatty Acids (FISH OIL) 1000 MG CAPS, Take 1 capsule by mouth daily. , Disp: , Rfl:  .  ONETOUCH DELICA LANCETS FINE MISC, Use to check blood sugar twice a day.  Dx  E11.8, Disp: 200 each, Rfl: 1 .  ONETOUCH VERIO test strip, USE AS INSTRUCTED TO CHECK BLOOD SUGAR TWICE A DAY. DX E11.8, Disp: 200 each, Rfl: 1 .  warfarin (COUMADIN) 7.5 MG tablet, TAKE  1/2 TO 1 TABLET DAILY AS DIRECTED BY COUMADIN CLINIC, Disp: 40 tablet, Rfl: 0  Allergies: No Known Allergies  Past Medical History, Surgical history, Social history, and Family History were reviewed and updated.  Review of Systems: Review of Systems  Constitutional: Negative.   HENT: Negative.   Eyes: Negative.   Respiratory: Negative.   Cardiovascular: Negative.   Gastrointestinal: Negative.   Genitourinary: Negative.   Musculoskeletal: Negative.   Skin: Negative.   Neurological: Negative.   Endo/Heme/Allergies: Negative.   Psychiatric/Behavioral: Negative.      Physical Exam:  weight is 226 lb 8 oz (102.7 kg). His oral temperature is 97.9 F (36.6 C). His blood pressure is 143/62 (abnormal) and his pulse is 62.   Physical Exam  Constitutional: He is  oriented to person, place, and time.  HENT:  Head: Normocephalic and atraumatic.  Mouth/Throat: Oropharynx is clear and moist.  Eyes: EOM are normal. Pupils are equal, round, and reactive to light.  Neck: Normal range of motion.  Cardiovascular: Normal rate, regular rhythm and normal heart sounds.  Pulmonary/Chest: Effort normal and breath sounds normal.  Abdominal: Soft. Bowel sounds are normal.  Musculoskeletal: Normal range of motion. He exhibits no edema, tenderness or deformity.  Lymphadenopathy:    He has no cervical adenopathy.  Neurological: He is alert and oriented to person, place, and time.  Skin: Skin is warm and dry. No rash noted. No erythema.  Psychiatric: He has a normal mood and affect. His behavior is normal. Judgment and thought content normal.  Vitals reviewed.   Lab Results  Component Value Date   WBC 8.0 03/29/2017   HGB 14.1 03/30/2016   HCT 35.9 (L) 03/29/2017   MCV 90.7 03/29/2017   PLT 237 03/29/2017     Chemistry      Component Value Date/Time   NA 136 01/25/2017 1031   NA 133 (L) 03/30/2016 1507   NA 138 09/23/2015 1020   K 4.2 01/25/2017 1031   K 4.3 03/30/2016 1507   K 4.4 09/23/2015 1020   CL 103 01/25/2017 1031   CL 99 03/30/2016 1507   CO2 25 01/25/2017 1031   CO2 25 03/30/2016 1507   CO2 25 09/23/2015 1020   BUN 35 (H) 01/25/2017 1031   BUN 31 (H) 03/30/2016 1507   BUN 28.4 (H) 09/23/2015 1020   CREATININE 1.50 01/25/2017 1031   CREATININE 1.30 (H) 03/30/2016 1507   CREATININE 1.3 09/23/2015 1020      Component Value Date/Time   CALCIUM 9.1 01/25/2017 1031   CALCIUM 10.1 03/30/2016 1507   CALCIUM 9.5 09/23/2015 1020   ALKPHOS 35 (L) 01/18/2017 1017   ALKPHOS 45 03/30/2016 1507   ALKPHOS 46 09/23/2015 1020   AST 27 01/18/2017 1017   AST 40 03/30/2016 1507   AST 41 (H) 09/23/2015 1020   ALT 19 01/18/2017 1017   ALT 29 03/30/2016 1507   ALT 29 09/23/2015 1020   BILITOT 0.5 01/18/2017 1017   BILITOT 0.3 03/30/2016 1507    BILITOT 0.46 09/23/2015 1020         Impression and Plan: [name] is a 15- gentleman with hemochromatosis. He is homozygous for the H63D mutation. He is doing quite well.  We will see what his iron studies show.  I would have to believe that they will still be low enough that he will not need to be phlebotomized.  We will see him back in 6 more months.Volanda Napoleon, MD 2/28/20193:32 PM

## 2017-03-30 ENCOUNTER — Telehealth: Payer: Self-pay | Admitting: *Deleted

## 2017-03-30 LAB — IRON AND TIBC
Iron: 112 ug/dL (ref 42–163)
Saturation Ratios: 24 % — ABNORMAL LOW (ref 42–163)
TIBC: 465 ug/dL — ABNORMAL HIGH (ref 202–409)
UIBC: 353 ug/dL

## 2017-03-30 LAB — FERRITIN: Ferritin: 61 ng/mL (ref 22–316)

## 2017-03-30 NOTE — Telephone Encounter (Addendum)
Message left on home voice mail.   ----- Message from David Napoleon, MD sent at 03/30/2017 12:04 PM EST ----- Call - iron level is ok!!  No need for a phlebotomy!!  [name]

## 2017-04-02 ENCOUNTER — Other Ambulatory Visit: Payer: Self-pay | Admitting: Family

## 2017-04-04 ENCOUNTER — Encounter: Payer: Self-pay | Admitting: Family

## 2017-04-06 ENCOUNTER — Telehealth: Payer: Self-pay | Admitting: Cardiology

## 2017-04-06 MED ORDER — WARFARIN SODIUM 7.5 MG PO TABS: ORAL_TABLET | ORAL | 0 refills | Status: DC

## 2017-04-06 NOTE — Telephone Encounter (Signed)
New Message      *STAT* If patient is at the pharmacy, call can be transferred to refill team.   1. Which medications need to be refilled? (please list name of each medication and dose if known)   warfarin (COUMADIN) 7.5 MG tablet TAKE 1/2 TO 1 TABLET DAILY AS DIRECTED BY COUMADIN CLINIC     2. Which pharmacy/location (including street and city if local pharmacy) is medication to be sent to? [facility] [place]   3. Do they need a 30 day or 90 day supply? [place]

## 2017-04-18 ENCOUNTER — Ambulatory Visit (INDEPENDENT_AMBULATORY_CARE_PROVIDER_SITE_OTHER): Payer: Medicare HMO | Admitting: *Deleted

## 2017-04-18 DIAGNOSIS — Z952 Presence of prosthetic heart valve: Secondary | ICD-10-CM | POA: Diagnosis not present

## 2017-04-18 DIAGNOSIS — Z5181 Encounter for therapeutic drug level monitoring: Secondary | ICD-10-CM | POA: Diagnosis not present

## 2017-04-18 DIAGNOSIS — I359 Nonrheumatic aortic valve disorder, unspecified: Secondary | ICD-10-CM

## 2017-04-18 LAB — POCT INR: INR: 2.1

## 2017-04-18 NOTE — Patient Instructions (Signed)
Description   Continue on same dosage of 1 tablet daily except 1/2 tablet on Tuesdays, Thursdays, Saturdays.  Recheck INR in 5 weeks.

## 2017-04-22 ENCOUNTER — Encounter: Payer: Self-pay | Admitting: Family

## 2017-04-22 DIAGNOSIS — R011 Cardiac murmur, unspecified: Secondary | ICD-10-CM

## 2017-04-23 ENCOUNTER — Encounter: Payer: Self-pay | Admitting: Family

## 2017-04-23 NOTE — Telephone Encounter (Signed)
See previous email regarding medication requests.

## 2017-04-24 MED ORDER — ATORVASTATIN CALCIUM 40 MG PO TABS: ORAL_TABLET | ORAL | 0 refills | Status: DC

## 2017-04-24 MED ORDER — OLMESARTAN MEDOXOMIL-HCTZ 20-12.5 MG PO TABS: 0.5000 | ORAL_TABLET | Freq: Every day | ORAL | 0 refills | Status: DC

## 2017-04-24 MED ORDER — FENOFIBRATE 145 MG PO TABS: 145.0000 mg | ORAL_TABLET | Freq: Every day | ORAL | 0 refills | Status: DC

## 2017-04-24 MED ORDER — METOPROLOL SUCCINATE ER 25 MG PO TB24: 12.5000 mg | ORAL_TABLET | Freq: Every day | ORAL | 0 refills | Status: DC

## 2017-05-23 ENCOUNTER — Other Ambulatory Visit: Payer: Self-pay | Admitting: Family

## 2017-05-23 ENCOUNTER — Ambulatory Visit: Payer: Medicare HMO | Admitting: *Deleted

## 2017-05-23 ENCOUNTER — Ambulatory Visit (INDEPENDENT_AMBULATORY_CARE_PROVIDER_SITE_OTHER): Payer: Medicare HMO | Admitting: Family

## 2017-05-23 ENCOUNTER — Encounter: Payer: Self-pay | Admitting: Family

## 2017-05-23 VITALS — BP 131/57 | Temp 98.4°F | Resp 16 | Ht 70.0 in | Wt 225.6 lb

## 2017-05-23 DIAGNOSIS — I359 Nonrheumatic aortic valve disorder, unspecified: Secondary | ICD-10-CM | POA: Diagnosis not present

## 2017-05-23 DIAGNOSIS — Z5181 Encounter for therapeutic drug level monitoring: Secondary | ICD-10-CM | POA: Diagnosis not present

## 2017-05-23 DIAGNOSIS — R42 Dizziness and giddiness: Secondary | ICD-10-CM

## 2017-05-23 DIAGNOSIS — E785 Hyperlipidemia, unspecified: Secondary | ICD-10-CM | POA: Diagnosis not present

## 2017-05-23 DIAGNOSIS — E118 Type 2 diabetes mellitus with unspecified complications: Secondary | ICD-10-CM | POA: Diagnosis not present

## 2017-05-23 DIAGNOSIS — E1159 Type 2 diabetes mellitus with other circulatory complications: Secondary | ICD-10-CM | POA: Diagnosis not present

## 2017-05-23 DIAGNOSIS — Z952 Presence of prosthetic heart valve: Secondary | ICD-10-CM | POA: Diagnosis not present

## 2017-05-23 DIAGNOSIS — I10 Essential (primary) hypertension: Secondary | ICD-10-CM | POA: Diagnosis not present

## 2017-05-23 DIAGNOSIS — I152 Hypertension secondary to endocrine disorders: Secondary | ICD-10-CM

## 2017-05-23 LAB — POCT INR: INR: 2.8

## 2017-05-23 NOTE — Patient Instructions (Signed)
Please return fasting for labs.

## 2017-05-23 NOTE — Patient Instructions (Signed)
Description   Continue on same dosage of 1 tablet daily except 1/2 tablet on Tuesdays, Thursdays, Saturdays.  Recheck INR in 6 weeks.    

## 2017-05-23 NOTE — Progress Notes (Signed)
Subjective:    Patient ID: [name], male    DOB: [birth year], 72 y.o.   MRN: [id number]  HPI  [name] is a 72 yr old male who presents today for follow up.  Vertigo- resolved.    DM2- reports stable sugars at home.  He continues Engineer, agricultural. Home sugars are reportedly stable.   Lab Results  Component Value Date   HGBA1C 7.1 (H) 01/18/2017   HGBA1C 7.3 (H) 09/26/2016   HGBA1C 7.5 (H) 05/26/2016   Lab Results  Component Value Date   MICROALBUR 8.7 (H) 09/09/2014   LDLCALC 54 11/25/2010   CREATININE 1.50 01/25/2017   Hyperlipidemia-  Not using lovaza due to cost and trying omega 3.   Lab Results  Component Value Date   CHOL 111 01/18/2017   HDL 28.50 (L) 01/18/2017   LDLCALC 54 11/25/2010   LDLDIRECT 61.0 01/18/2017   TRIG 224.0 (H) 01/18/2017   CHOLHDL 4 01/18/2017   HTN-patient is maintained on Benicar HCT. BP Readings from Last 3 Encounters:  05/23/17 (!) 131/57  03/29/17 (!) 143/62  01/17/17 (!) 114/59   Still has some swelling but is not using lasix.    Review of Systems    see hpi  Past Medical History:  Diagnosis Date  . Allergy   . Aortic stenosis   . CAD (coronary artery disease)    cabg  . Cancer [facility]) 2011   prostate  . Erectile dysfunction   . Heart murmur   . Hemochromatosis 02/14/2011  . Hemorrhoids   . History of gout   . History of hepatitis 1974  . Hyperlipidemia   . Hypertension   . Hypertr obst cardiomyop   . Obesity    moderate  . Sensorineural hearing loss   . Type II or unspecified type diabetes mellitus without mention of complication, not stated as uncontrolled      Social History   Socioeconomic History  . Marital status: Married    Spouse name: Not on file  . Number of children: Not on file  . Years of education: Not on file  . Highest education level: Not on file  Occupational History  . Not on file  Social Needs  . Financial resource strain: Not on file  . Food insecurity:    Worry: Not on file   Inability: Not on file  . Transportation needs:    Medical: Not on file    Non-medical: Not on file  Tobacco Use  . Smoking status: Former Smoker    Types: Cigars  . Smokeless tobacco: Never Used  . Tobacco comment: Never used Tobacco  Substance and Sexual Activity  . Alcohol use: Yes    Alcohol/week: 0.0 oz    Comment: 4 glasses wine a month  . Drug use: No  . Sexual activity: Never  Lifestyle  . Physical activity:    Days per week: Not on file    Minutes per session: Not on file  . Stress: Not on file  Relationships  . Social connections:    Talks on phone: Not on file    Gets together: Not on file    Attends religious service: Not on file    Active member of club or organization: Not on file    Attends meetings of clubs or organizations: Not on file    Relationship status: Not on file  . Intimate partner violence:    Fear of current or ex partner: Not on file    Emotionally abused:  Not on file    Physically abused: Not on file    Forced sexual activity: Not on file  Other Topics Concern  . Not on file  Social History Narrative  . Not on file    Past Surgical History:  Procedure Laterality Date  . 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 APPENDECTOMY  1990  . CHOLECYSTECTOMY  1990  . CORONARY ARTERY BYPASS GRAFT  10/2003  . HERNIA REPAIR  1999   right, inguinal  . HERNIA REPAIR  2002   left, inguinal  . HIP SURGERY  2006   right hip  . PILONIDAL CYST EXCISION  1964  . prostate seed implant  3/12  . TONSILLECTOMY  childhood    Family History  Problem Relation Age of Onset  . Heart disease Mother   . Stroke Mother   . Heart disease Father   . Asperger's syndrome Son   . Hyperlipidemia Son   . Coronary artery disease Brother   . Cancer Neg Hx        negative for colon cancer    No Known Allergies  Current Outpatient Medications on File Prior to Visit  Medication Sig Dispense Refill  . atorvastatin (LIPITOR) 40 MG tablet TAKE ONE (1) TABLET  BY MOUTH EVERY DAY 6 tablet 0  . Blood Glucose Monitoring Suppl (ONETOUCH VERIO FLEX SYSTEM) w/Device KIT 1 each by Does not apply route 2 (two) times daily. 1 kit 0  . FLUZONE HIGH-DOSE 0.5 ML injection TO BE ADMINISTERED BY PHARMACIST FOR IMMUNIZATION  0  . furosemide (LASIX) 20 MG tablet Take 1 tablet (20 mg total) by mouth daily as needed. For swelling 90 tablet 1  . imiquimod (ALDARA) 5 % cream Apply topically 3 (three) times a week.    . Insulin Glargine (BASAGLAR KWIKPEN) 100 UNIT/ML SOPN Inject 0.41 mLs (41 Units total) into the skin at bedtime. 5 pen 5  . meclizine (ANTIVERT) 25 MG tablet Take 1 tablet (25 mg total) by mouth 3 (three) times daily as needed for dizziness. 90 tablet 0  . metFORMIN (GLUCOPHAGE) 1000 MG tablet Take 0.5 tablets (500 mg total) by mouth 2 (two) times daily with a meal. 180 tablet 1  . metoprolol succinate (TOPROL-XL) 25 MG 24 hr tablet Take 0.5 tablets (12.5 mg total) by mouth daily. 3 tablet 0  . NOVOFINE PLUS 32G X 4 MM MISC USE FOR INJECTIONS INTO THE SKIN EVERY EVENING 100 each 1  . olmesartan-hydrochlorothiazide (BENICAR HCT) 20-12.5 MG tablet Take 0.5 tablets by mouth daily. 3 tablet 0  . omega-3 acid ethyl esters (LOVAZA) 1 g capsule Take 1 capsule (1 g total) 2 (two) times daily by mouth. 60 capsule 6  . Omega-3 Fatty Acids (FISH OIL) 1000 MG CAPS Take 1 capsule by mouth daily.     [name] Use to check blood sugar twice a day.  Dx  E11.8 200 each 1  . ONETOUCH VERIO test strip USE AS INSTRUCTED TO CHECK BLOOD SUGAR TWICE A DAY. DX E11.8 200 each 1  . warfarin (COUMADIN) 7.5 MG tablet Take 1/2 to 1 tablet daily as directed by coumadin clinic 90 tablet 0   No current facility-administered medications on file prior to visit.     BP (!) 131/57 (BP Location: Right Arm, Patient Position: Sitting, Cuff Size: Large)   Temp 98.4 F (36.9 C) (David)   Resp 16   Ht _0  (1.778 m)   Wt  225 lb 9.6 oz (102.3 kg)   SpO2 96%   BMI  32.37 kg/m    Objective:   Physical Exam  Constitutional: He is oriented to person, place, and time. He appears well-developed and well-nourished. No distress.  HENT:  Head: Normocephalic and atraumatic.  Cardiovascular: Normal rate and regular rhythm.  No murmur heard. Pulmonary/Chest: Effort normal and breath sounds normal. No respiratory distress. He has no wheezes. He has no rales.  Musculoskeletal:  3+ bilateral LE edema  Neurological: He is alert and oriented to person, place, and time.  Skin: Skin is warm and dry.  Psychiatric: He has a normal mood and affect. His behavior is normal. Thought content normal.          Assessment & Plan:  HTN- bp stable on current meds. Continue same.   Vertigo-  Stable/improved.  Hyperlipidemia- he will return fasting for lipid panel.    Diabetes type 2-clinically stable.  A1c is reasonable for his age. Lab Results  Component Value Date   HGBA1C 7.2 (H) 05/25/2017

## 2017-05-25 ENCOUNTER — Other Ambulatory Visit (INDEPENDENT_AMBULATORY_CARE_PROVIDER_SITE_OTHER): Payer: Medicare HMO

## 2017-05-25 ENCOUNTER — Telehealth: Payer: Self-pay | Admitting: Family

## 2017-05-25 ENCOUNTER — Other Ambulatory Visit: Payer: Medicare HMO

## 2017-05-25 DIAGNOSIS — E118 Type 2 diabetes mellitus with unspecified complications: Secondary | ICD-10-CM | POA: Diagnosis not present

## 2017-05-25 DIAGNOSIS — E785 Hyperlipidemia, unspecified: Secondary | ICD-10-CM

## 2017-05-25 DIAGNOSIS — I10 Essential (primary) hypertension: Secondary | ICD-10-CM

## 2017-05-25 DIAGNOSIS — I152 Hypertension secondary to endocrine disorders: Secondary | ICD-10-CM

## 2017-05-25 DIAGNOSIS — N289 Disorder of kidney and ureter, unspecified: Secondary | ICD-10-CM

## 2017-05-25 DIAGNOSIS — E1159 Type 2 diabetes mellitus with other circulatory complications: Secondary | ICD-10-CM | POA: Diagnosis not present

## 2017-05-25 LAB — LIPID PANEL
CHOL/HDL RATIO: 4
Cholesterol: 123 mg/dL (ref 0–200)
HDL: 27.9 mg/dL — AB (ref >39.00)
NonHDL: 94.67
Triglycerides: 240 mg/dL — ABNORMAL HIGH (ref 0.0–149.0)
VLDL: 48 mg/dL — AB (ref 0.0–40.0)

## 2017-05-25 LAB — COMPREHENSIVE METABOLIC PANEL
ALT: 18 U/L (ref 0–53)
AST: 26 U/L (ref 0–37)
Albumin: 4.4 g/dL (ref 3.5–5.2)
Alkaline Phosphatase: 36 U/L — ABNORMAL LOW (ref 39–117)
BUN: 37 mg/dL — AB (ref 6–23)
CALCIUM: 9.3 mg/dL (ref 8.4–10.5)
CO2: 27 meq/L (ref 19–32)
CREATININE: 1.96 mg/dL — AB (ref 0.40–1.50)
Chloride: 101 mEq/L (ref 96–112)
GFR: 35.89 mL/min — ABNORMAL LOW (ref >60.00)
Glucose, Bld: 121 mg/dL — ABNORMAL HIGH (ref 70–99)
POTASSIUM: 4.5 meq/L (ref 3.5–5.1)
SODIUM: 138 meq/L (ref 135–145)
Total Bilirubin: 0.5 mg/dL (ref 0.2–1.2)
Total Protein: 7.6 g/dL (ref 6.0–8.3)

## 2017-05-25 LAB — HEMOGLOBIN A1C: Hgb A1c MFr Bld: 7.2 % — ABNORMAL HIGH (ref 4.6–6.5)

## 2017-05-25 LAB — LDL CHOLESTEROL, DIRECT: Direct LDL: 62 mg/dL

## 2017-05-25 MED ORDER — BASAGLAR KWIKPEN 100 UNIT/ML ~~LOC~~ SOPN: 46.0000 [IU] | PEN_INJECTOR | Freq: Every day | SUBCUTANEOUS | 5 refills | Status: DC

## 2017-05-25 NOTE — Telephone Encounter (Signed)
Please contact patient to schedule a lab draw in 1 week.

## 2017-05-28 ENCOUNTER — Other Ambulatory Visit: Payer: Self-pay | Admitting: Family

## 2017-05-28 NOTE — Telephone Encounter (Signed)
Called pt and LVM apologizing for the confusion and advised him to call back and schedule a follow up visit.

## 2017-05-28 NOTE — Telephone Encounter (Signed)
Yes this is the correct pt, that was a typo. Thanks.

## 2017-06-01 ENCOUNTER — Encounter: Payer: Self-pay | Admitting: Family

## 2017-06-06 ENCOUNTER — Telehealth: Payer: Self-pay | Admitting: Family

## 2017-06-06 ENCOUNTER — Other Ambulatory Visit (INDEPENDENT_AMBULATORY_CARE_PROVIDER_SITE_OTHER): Payer: Medicare HMO

## 2017-06-06 DIAGNOSIS — N289 Disorder of kidney and ureter, unspecified: Secondary | ICD-10-CM

## 2017-06-06 LAB — BASIC METABOLIC PANEL
BUN: 36 mg/dL — ABNORMAL HIGH (ref 6–23)
CALCIUM: 9.3 mg/dL (ref 8.4–10.5)
CO2: 27 mEq/L (ref 19–32)
Chloride: 101 mEq/L (ref 96–112)
Creatinine, Ser: 1.74 mg/dL — ABNORMAL HIGH (ref 0.40–1.50)
GFR: 41.17 mL/min — AB (ref >60.00)
Glucose, Bld: 160 mg/dL — ABNORMAL HIGH (ref 70–99)
POTASSIUM: 4.3 meq/L (ref 3.5–5.1)
SODIUM: 136 meq/L (ref 135–145)

## 2017-06-06 NOTE — Telephone Encounter (Signed)
Please contact pt and let him know that his kidney function has improved slightly but is still diminished.  I would like for him to complete an ultrasound of his kidneys and refer him non-urgently to a kidney doctor.

## 2017-06-06 NOTE — Telephone Encounter (Signed)
Notified pt and he is agreeable to plan. Pt was transferred to radiology to schedule u/s.

## 2017-06-07 ENCOUNTER — Other Ambulatory Visit: Payer: Self-pay | Admitting: Family

## 2017-06-08 ENCOUNTER — Ambulatory Visit (HOSPITAL_BASED_OUTPATIENT_CLINIC_OR_DEPARTMENT_OTHER)
Admission: RE | Admit: 2017-06-08 | Discharge: 2017-06-08 | Disposition: A | Discharge status: Home / Self Care | Payer: Medicare HMO | Source: Ambulatory Visit | Attending: Family | Admitting: Family

## 2017-06-08 DIAGNOSIS — N289 Disorder of kidney and ureter, unspecified: Secondary | ICD-10-CM | POA: Insufficient documentation

## 2017-06-08 DIAGNOSIS — N281 Cyst of kidney, acquired: Secondary | ICD-10-CM | POA: Diagnosis not present

## 2017-06-09 ENCOUNTER — Encounter: Payer: Self-pay | Admitting: Family

## 2017-06-12 NOTE — Progress Notes (Signed)
HPI: Followup of coronary artery disease. Patient is status post coronary artery bypassing graft (LIMA to the LAD, saphenous vein graft to the marginal, saphenous vein graft sequentially to the right coronary artery and distal circumflex) as well as aortic valve replacement (St. Jude aortic valve) in 2005. He also had a septal myectomy at that time. Carotid Dopplers in May of 2013 showed 0-39% bilateral stenosis.  Abdominal CT April 2015 showed no aneurysm. Nuclear study May 2016 showed normal perfusion and normal LV function.  Last echocardiogram November 2018 showed vigorous LV function, grade 1 diastolic dysfunction, prior aortic valve replacement with mean gradient 10 mmHg, mild left atrial enlargement and mild tricuspid regurgitation.  There is note of severe basal septal hypertrophy with chordal Sam and resting gradient of 16 mmHg increasing to 85 mmHg with Valsalva.  Findings concerning for hypertrophic cardiomyopathy.  Since last seen, patient denies dyspnea, chest pain, palpitations or syncope.  No bleeding.  Current Outpatient Medications  Medication Sig Dispense Refill  . atorvastatin (LIPITOR) 20 MG tablet TAKE 1 TABLET BY MOUTH EVERY DAY 90 tablet 1  . Blood Glucose Monitoring Suppl (ONETOUCH VERIO FLEX SYSTEM) w/Device KIT 1 each by Does not apply route 2 (two) times daily. 1 kit 0  . fenofibrate (TRICOR) 145 MG tablet TAKE 1 TABLET BY MOUTH EVERY DAY 90 tablet 0  . FLUZONE HIGH-DOSE 0.5 ML injection TO BE ADMINISTERED BY PHARMACIST FOR IMMUNIZATION  0  . furosemide (LASIX) 20 MG tablet Take 1 tablet (20 mg total) by mouth daily as needed. For swelling 90 tablet 1  . imiquimod (ALDARA) 5 % cream Apply topically 3 (three) times a week.    . Insulin Glargine (BASAGLAR KWIKPEN) 100 UNIT/ML SOPN Inject 0.46 mLs (46 Units total) into the skin at bedtime. 5 pen 5  . meclizine (ANTIVERT) 25 MG tablet TAKE 1 TABLET (25 MG TOTAL) BY MOUTH 3 (THREE) TIMES DAILY AS NEEDED FOR DIZZINESS. 90  tablet 0  . metoprolol succinate (TOPROL-XL) 25 MG 24 hr tablet Take 0.5 tablets (12.5 mg total) by mouth daily. 3 tablet 0  . NOVOFINE PLUS 32G X 4 MM MISC USE FOR INJECTIONS INTO THE SKIN EVERY EVENING 100 each 1  . olmesartan-hydrochlorothiazide (BENICAR HCT) 20-12.5 MG tablet TAKE 0.5 TABLETS BY MOUTH DAILY. 45 tablet 1  . omega-3 acid ethyl esters (LOVAZA) 1 g capsule Take 1 capsule (1 g total) 2 (two) times daily by mouth. 60 capsule 6  . ONETOUCH DELICA LANCETS FINE MISC Use to check blood sugar twice a day.  Dx  E11.8 200 each 1  . ONETOUCH VERIO test strip USE AS INSTRUCTED TO CHECK BLOOD SUGAR TWICE A DAY. DX E11.8 200 each 1  . warfarin (COUMADIN) 7.5 MG tablet Take 1/2 to 1 tablet daily as directed by coumadin clinic 90 tablet 0   No current facility-administered medications for this visit.      Past Medical History:  Diagnosis Date  . Allergy   . Aortic stenosis   . CAD (coronary artery disease)    cabg  . Cancer [facility]) 2011   prostate  . Erectile dysfunction   . Heart murmur   . Hemochromatosis 02/14/2011  . Hemorrhoids   . History of gout   . History of hepatitis 1974  . Hyperlipidemia   . Hypertension   . Hypertr obst cardiomyop   . Obesity    moderate  . Sensorineural hearing loss   . Type II or unspecified type diabetes mellitus  without mention of complication, not stated as uncontrolled     Past Surgical History:  Procedure Laterality Date  . 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 APPENDECTOMY  1990  . CHOLECYSTECTOMY  1990  . CORONARY ARTERY BYPASS GRAFT  10/2003  . HERNIA REPAIR  1999   right, inguinal  . HERNIA REPAIR  2002   left, inguinal  . HIP SURGERY  2006   right hip  . PILONIDAL CYST EXCISION  1964  . prostate seed implant  3/12  . TONSILLECTOMY  childhood    Social History   Socioeconomic History  . Marital status: Married    Spouse name: Not on file  . Number of children: Not on file  . Years of education: Not on file    . Highest education level: Not on file  Occupational History  . Not on file  Social Needs  . Financial resource strain: Not on file  . Food insecurity:    Worry: Not on file    Inability: Not on file  . Transportation needs:    Medical: Not on file    Non-medical: Not on file  Tobacco Use  . Smoking status: Former Smoker    Types: Cigars  . Smokeless tobacco: Never Used  . Tobacco comment: Never used Tobacco  Substance and Sexual Activity  . Alcohol use: Yes    Alcohol/week: 0.0 oz    Comment: 4 glasses wine a month  . Drug use: No  . Sexual activity: Never  Lifestyle  . Physical activity:    Days per week: Not on file    Minutes per session: Not on file  . Stress: Not on file  Relationships  . Social connections:    Talks on phone: Not on file    Gets together: Not on file    Attends religious service: Not on file    Active member of club or organization: Not on file    Attends meetings of clubs or organizations: Not on file    Relationship status: Not on file  . Intimate partner violence:    Fear of current or ex partner: Not on file    Emotionally abused: Not on file    Physically abused: Not on file    Forced sexual activity: Not on file  Other Topics Concern  . Not on file  Social History Narrative  . Not on file    Family History  Problem Relation Age of Onset  . Heart disease Mother   . Stroke Mother   . Heart disease Father   . Asperger's syndrome Son   . Hyperlipidemia Son   . Coronary artery disease Brother   . Cancer Neg Hx        negative for colon cancer    ROS: no fevers or chills, productive cough, hemoptysis, dysphasia, odynophagia, melena, hematochezia, dysuria, hematuria, rash, seizure activity, orthopnea, PND, pedal edema, claudication. Remaining systems are negative.  Physical Exam: Well-developed well-nourished in no acute distress.  Skin is warm and dry.  HEENT is normal.  Neck is supple.  Chest is clear to auscultation with  normal expansion.  Cardiovascular exam is regular rate and rhythm.  Crisp mechanical valve sound.  2/6 systolic murmur.  No diastolic murmur. Abdominal exam nontender or distended. No masses palpated. Extremities show no edema. neuro grossly intact  A/P  1 history of St Jude aortic valve replacement-continue SBE prophylaxis.  Continue Coumadin.  Add aspirin 81 mg daily.  2 history of hypertrophic obstructive cardiomyopathy-status post myectomy.  Recent echocardiogram showed increased gradient with Valsalva but patient is not symptomatic.  Continue beta-blocker.  3 coronary artery disease-continue statin.  4 hypertension-continue present blood pressure medications.  Blood pressure is controlled.  5 hyperlipidemia-continue statin.  Kirk Ruths, MD

## 2017-06-14 ENCOUNTER — Other Ambulatory Visit: Payer: Self-pay | Admitting: Family

## 2017-06-15 DIAGNOSIS — N183 Chronic kidney disease, stage 3 (moderate): Secondary | ICD-10-CM | POA: Diagnosis not present

## 2017-06-15 DIAGNOSIS — E1122 Type 2 diabetes mellitus with diabetic chronic kidney disease: Secondary | ICD-10-CM | POA: Diagnosis not present

## 2017-06-15 DIAGNOSIS — Z952 Presence of prosthetic heart valve: Secondary | ICD-10-CM | POA: Diagnosis not present

## 2017-06-15 DIAGNOSIS — I2510 Atherosclerotic heart disease of native coronary artery without angina pectoris: Secondary | ICD-10-CM | POA: Diagnosis not present

## 2017-06-15 DIAGNOSIS — I129 Hypertensive chronic kidney disease with stage 1 through stage 4 chronic kidney disease, or unspecified chronic kidney disease: Secondary | ICD-10-CM | POA: Diagnosis not present

## 2017-06-15 DIAGNOSIS — N2581 Secondary hyperparathyroidism of renal origin: Secondary | ICD-10-CM | POA: Diagnosis not present

## 2017-06-15 MED ORDER — BASAGLAR KWIKPEN 100 UNIT/ML ~~LOC~~ SOPN: 46.0000 [IU] | PEN_INJECTOR | Freq: Every day | SUBCUTANEOUS | 5 refills | Status: DC

## 2017-06-20 ENCOUNTER — Ambulatory Visit: Payer: Medicare HMO | Admitting: Cardiology

## 2017-06-20 ENCOUNTER — Encounter: Payer: Self-pay | Admitting: Cardiology

## 2017-06-20 VITALS — BP 119/71 | HR 58 | Ht 70.0 in | Wt 222.0 lb

## 2017-06-20 DIAGNOSIS — I2510 Atherosclerotic heart disease of native coronary artery without angina pectoris: Secondary | ICD-10-CM | POA: Diagnosis not present

## 2017-06-20 DIAGNOSIS — Z952 Presence of prosthetic heart valve: Secondary | ICD-10-CM

## 2017-06-20 DIAGNOSIS — I10 Essential (primary) hypertension: Secondary | ICD-10-CM

## 2017-06-20 MED ORDER — ASPIRIN EC 81 MG PO TBEC: 81.0000 mg | DELAYED_RELEASE_TABLET | Freq: Every day | ORAL | 3 refills | Status: DC

## 2017-06-20 NOTE — Patient Instructions (Signed)
Medication Instructions:   START ASPIRIN 81 MG ONCE DAILY  Follow-Up:  Your physician wants you to follow-up in: ONE YEAR WITH DR CRENSHAW You will receive a reminder letter in the mail two months in advance. If you don't receive a letter, please call our office to schedule the follow-up appointment.   If you need a refill on your cardiac medications before your next appointment, please call your pharmacy.    

## 2017-06-21 ENCOUNTER — Encounter: Payer: Self-pay | Admitting: Cardiology

## 2017-06-21 ENCOUNTER — Other Ambulatory Visit: Payer: Self-pay | Admitting: *Deleted

## 2017-06-21 MED ORDER — AMOXICILLIN 500 MG PO TABS: ORAL_TABLET | ORAL | 6 refills | Status: DC

## 2017-06-27 ENCOUNTER — Other Ambulatory Visit: Payer: Self-pay | Admitting: Family

## 2017-06-28 ENCOUNTER — Encounter: Payer: Self-pay | Admitting: Family

## 2017-06-28 DIAGNOSIS — R69 Illness, unspecified: Secondary | ICD-10-CM | POA: Diagnosis not present

## 2017-06-29 ENCOUNTER — Other Ambulatory Visit: Payer: Self-pay | Admitting: Physician Assistant

## 2017-06-29 DIAGNOSIS — R69 Illness, unspecified: Secondary | ICD-10-CM | POA: Diagnosis not present

## 2017-06-29 DIAGNOSIS — R011 Cardiac murmur, unspecified: Secondary | ICD-10-CM

## 2017-06-29 NOTE — Telephone Encounter (Signed)
Rx sent to pharmacy   

## 2017-06-30 DIAGNOSIS — R69 Illness, unspecified: Secondary | ICD-10-CM | POA: Diagnosis not present

## 2017-07-03 ENCOUNTER — Encounter: Payer: Self-pay | Admitting: Cardiology

## 2017-07-04 ENCOUNTER — Ambulatory Visit (INDEPENDENT_AMBULATORY_CARE_PROVIDER_SITE_OTHER): Payer: Medicare HMO | Admitting: *Deleted

## 2017-07-04 DIAGNOSIS — Z952 Presence of prosthetic heart valve: Secondary | ICD-10-CM

## 2017-07-04 DIAGNOSIS — Z5181 Encounter for therapeutic drug level monitoring: Secondary | ICD-10-CM

## 2017-07-04 DIAGNOSIS — I359 Nonrheumatic aortic valve disorder, unspecified: Secondary | ICD-10-CM

## 2017-07-04 LAB — POCT INR: INR: 3 (ref 2.0–3.0)

## 2017-07-04 NOTE — Patient Instructions (Signed)
Description   Continue on same dosage of 1 tablet daily except 1/2 tablet on Tuesdays, Thursdays, Saturdays.  Recheck INR in 6 weeks.    

## 2017-07-06 ENCOUNTER — Other Ambulatory Visit: Payer: Self-pay | Admitting: *Deleted

## 2017-07-06 MED ORDER — ATORVASTATIN CALCIUM 20 MG PO TABS: 20.0000 mg | ORAL_TABLET | Freq: Every day | ORAL | 3 refills | Status: DC

## 2017-07-13 ENCOUNTER — Other Ambulatory Visit: Payer: Self-pay | Admitting: Physician Assistant

## 2017-07-13 DIAGNOSIS — R011 Cardiac murmur, unspecified: Secondary | ICD-10-CM

## 2017-07-29 ENCOUNTER — Encounter: Payer: Self-pay | Admitting: Family

## 2017-08-10 ENCOUNTER — Other Ambulatory Visit: Payer: Self-pay | Admitting: Family

## 2017-08-10 ENCOUNTER — Encounter: Payer: Self-pay | Admitting: Family

## 2017-08-10 NOTE — Telephone Encounter (Signed)
Routed basaglar rx to NP to review.

## 2017-08-13 NOTE — Telephone Encounter (Signed)
Received Physician Orders from [place] for captioned phone due to hearing loss; forwarded to provider/SLS 07/15

## 2017-08-15 ENCOUNTER — Ambulatory Visit: Payer: Medicare HMO | Admitting: Pharmacist

## 2017-08-15 DIAGNOSIS — Z952 Presence of prosthetic heart valve: Secondary | ICD-10-CM

## 2017-08-15 DIAGNOSIS — I359 Nonrheumatic aortic valve disorder, unspecified: Secondary | ICD-10-CM | POA: Diagnosis not present

## 2017-08-15 LAB — POCT INR: INR: 2.4 (ref 2.0–3.0)

## 2017-08-15 NOTE — Patient Instructions (Signed)
Description   Continue on same dosage of 1 tablet daily except 1/2 tablet on Tuesdays, Thursdays, Saturdays.  Recheck INR in 6 weeks.    

## 2017-08-22 ENCOUNTER — Encounter: Payer: Self-pay | Admitting: Cardiology

## 2017-08-22 ENCOUNTER — Other Ambulatory Visit: Payer: Self-pay | Admitting: Cardiology

## 2017-08-24 DIAGNOSIS — E119 Type 2 diabetes mellitus without complications: Secondary | ICD-10-CM | POA: Diagnosis not present

## 2017-08-24 DIAGNOSIS — Z6830 Body mass index (BMI) 30.0-30.9, adult: Secondary | ICD-10-CM | POA: Diagnosis not present

## 2017-08-24 DIAGNOSIS — Z833 Family history of diabetes mellitus: Secondary | ICD-10-CM | POA: Diagnosis not present

## 2017-08-24 DIAGNOSIS — Z7901 Long term (current) use of anticoagulants: Secondary | ICD-10-CM | POA: Diagnosis not present

## 2017-08-24 DIAGNOSIS — R69 Illness, unspecified: Secondary | ICD-10-CM | POA: Diagnosis not present

## 2017-08-24 DIAGNOSIS — I10 Essential (primary) hypertension: Secondary | ICD-10-CM | POA: Diagnosis not present

## 2017-08-24 DIAGNOSIS — E785 Hyperlipidemia, unspecified: Secondary | ICD-10-CM | POA: Diagnosis not present

## 2017-08-24 DIAGNOSIS — E669 Obesity, unspecified: Secondary | ICD-10-CM | POA: Diagnosis not present

## 2017-08-24 DIAGNOSIS — Z8249 Family history of ischemic heart disease and other diseases of the circulatory system: Secondary | ICD-10-CM | POA: Diagnosis not present

## 2017-08-24 DIAGNOSIS — I2510 Atherosclerotic heart disease of native coronary artery without angina pectoris: Secondary | ICD-10-CM | POA: Diagnosis not present

## 2017-08-26 ENCOUNTER — Other Ambulatory Visit: Payer: Self-pay | Admitting: Family

## 2017-08-27 ENCOUNTER — Encounter: Payer: Self-pay | Admitting: Cardiology

## 2017-08-28 ENCOUNTER — Encounter: Payer: Self-pay | Admitting: Family

## 2017-08-29 ENCOUNTER — Ambulatory Visit (INDEPENDENT_AMBULATORY_CARE_PROVIDER_SITE_OTHER): Payer: Medicare HMO | Admitting: Family

## 2017-08-29 ENCOUNTER — Encounter: Payer: Self-pay | Admitting: Family

## 2017-08-29 VITALS — BP 116/62 | HR 61 | Temp 98.4°F | Resp 18 | Ht 70.0 in | Wt 221.0 lb

## 2017-08-29 DIAGNOSIS — I10 Essential (primary) hypertension: Secondary | ICD-10-CM

## 2017-08-29 DIAGNOSIS — E118 Type 2 diabetes mellitus with unspecified complications: Secondary | ICD-10-CM

## 2017-08-29 DIAGNOSIS — E785 Hyperlipidemia, unspecified: Secondary | ICD-10-CM

## 2017-08-29 LAB — LIPID PANEL
Cholesterol: 144 mg/dL (ref 0–200)
HDL: 27.2 mg/dL — AB (ref >39.00)
NonHDL: 116.62
TRIGLYCERIDES: 240 mg/dL — AB (ref 0.0–149.0)
Total CHOL/HDL Ratio: 5
VLDL: 48 mg/dL — AB (ref 0.0–40.0)

## 2017-08-29 LAB — BASIC METABOLIC PANEL
BUN: 39 mg/dL — ABNORMAL HIGH (ref 6–23)
CALCIUM: 9.5 mg/dL (ref 8.4–10.5)
CO2: 31 mEq/L (ref 19–32)
Chloride: 103 mEq/L (ref 96–112)
Creatinine, Ser: 1.89 mg/dL — ABNORMAL HIGH (ref 0.40–1.50)
GFR: 37.4 mL/min — AB (ref >60.00)
Glucose, Bld: 113 mg/dL — ABNORMAL HIGH (ref 70–99)
Potassium: 4.6 mEq/L (ref 3.5–5.1)
SODIUM: 139 meq/L (ref 135–145)

## 2017-08-29 LAB — LDL CHOLESTEROL, DIRECT: Direct LDL: 77 mg/dL

## 2017-08-29 LAB — HEMOGLOBIN A1C: Hgb A1c MFr Bld: 7.5 % — ABNORMAL HIGH (ref 4.6–6.5)

## 2017-08-29 MED ORDER — FENOFIBRATE 145 MG PO TABS: 145.0000 mg | ORAL_TABLET | Freq: Every day | ORAL | 1 refills | Status: DC

## 2017-08-29 NOTE — Patient Instructions (Signed)
Please complete lab work prior to leaving. Continue to work on healthy diet exercise and weight loss.

## 2017-08-29 NOTE — Progress Notes (Signed)
Subjective:    Patient ID: [name], male    DOB: 10-09-1945, 72 y.o.   MRN: [id number]  HPI  Patient is a 72 year old male who presents today for routine follow-up.   HTN-BP medications include Toprol-XL 12.5 mg once daily, and Benicar HCT. BP Readings from Last 3 Encounters:  08/29/17 116/62  06/20/17 119/71  05/23/17 (!) 131/57   Hyperlipidemia-he is maintained obtained on Lipitor and TriCor denies myalgia.  Lab Results  Component Value Date   CHOL 123 05/25/2017   HDL 27.90 (L) 05/25/2017   LDLCALC 54 11/25/2010   LDLDIRECT 62.0 05/25/2017   TRIG 240.0 (H) 05/25/2017   CHOLHDL 4 05/25/2017   DM2-current medications include Basaglar 46 units once daily.   Lab Results  Component Value Date   HGBA1C 7.2 (H) 05/25/2017   HGBA1C 7.1 (H) 01/18/2017   HGBA1C 7.3 (H) 09/26/2016   Lab Results  Component Value Date   MICROALBUR 8.7 (H) 09/09/2014   LDLCALC 54 11/25/2010   CREATININE 1.74 (H) 06/06/2017       Review of Systems See HPI  Past Medical History:  Diagnosis Date  . Allergy   . Aortic stenosis   . CAD (coronary artery disease)    cabg  . Cancer [facility]) 2011   prostate  . Erectile dysfunction   . Heart murmur   . Hemochromatosis 02/14/2011  . Hemorrhoids   . History of gout   . History of hepatitis 1974  . Hyperlipidemia   . Hypertension   . Hypertr obst cardiomyop   . Obesity    moderate  . Sensorineural hearing loss   . Type II or unspecified type diabetes mellitus without mention of complication, not stated as uncontrolled      Social History   Socioeconomic History  . Marital status: Married    Spouse name: Not on file  . Number of children: Not on file  . Years of education: Not on file  . Highest education level: Not on file  Occupational History  . Not on file  Social Needs  . Financial resource strain: Not on file  . Food insecurity:    Worry: Not on file    Inability: Not on file  . Transportation needs:    Medical:  Not on file    Non-medical: Not on file  Tobacco Use  . Smoking status: Former Smoker    Types: Cigars  . Smokeless tobacco: Never Used  . Tobacco comment: Never used Tobacco  Substance and Sexual Activity  . Alcohol use: Yes    Alcohol/week: 0.0 oz    Comment: 4 glasses wine a month  . Drug use: No  . Sexual activity: Never  Lifestyle  . Physical activity:    Days per week: Not on file    Minutes per session: Not on file  . Stress: Not on file  Relationships  . Social connections:    Talks on phone: Not on file    Gets together: Not on file    Attends religious service: Not on file    Active member of club or organization: Not on file    Attends meetings of clubs or organizations: Not on file    Relationship status: Not on file  . Intimate partner violence:    Fear of current or ex partner: Not on file    Emotionally abused: Not on file    Physically abused: Not on file    Forced sexual activity: Not on file  Other  Topics Concern  . Not on file  Social History Narrative  . Not on file    Past Surgical History:  Procedure Laterality Date  . 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 APPENDECTOMY  1990  . CHOLECYSTECTOMY  1990  . CORONARY ARTERY BYPASS GRAFT  10/2003  . HERNIA REPAIR  1999   right, inguinal  . HERNIA REPAIR  2002   left, inguinal  . HIP SURGERY  2006   right hip  . PILONIDAL CYST EXCISION  1964  . prostate seed implant  3/12  . TONSILLECTOMY  childhood    Family History  Problem Relation Age of Onset  . Heart disease Mother   . Stroke Mother   . Heart disease Father   . Asperger's syndrome Son   . Hyperlipidemia Son   . Coronary artery disease Brother   . Cancer Neg Hx        negative for colon cancer    No Known Allergies  Current Outpatient Medications on File Prior to Visit  Medication Sig Dispense Refill  . amoxicillin (AMOXIL) 500 MG tablet Take all 4 tablets one hour prior to procedure 4 tablet 6  . aspirin EC 81 MG  tablet Take 1 tablet (81 mg total) by mouth daily. 90 tablet 3  . atorvastatin (LIPITOR) 20 MG tablet Take 1 tablet (20 mg total) by mouth daily. 90 tablet 3  . Blood Glucose Monitoring Suppl (ONETOUCH VERIO FLEX SYSTEM) w/Device KIT 1 each by Does not apply route 2 (two) times daily. 1 kit 0  . fenofibrate (TRICOR) 145 MG tablet TAKE 1 TABLET BY MOUTH EVERY DAY 90 tablet 0  . furosemide (LASIX) 20 MG tablet Take 1 tablet (20 mg total) by mouth daily as needed. For swelling 90 tablet 1  . imiquimod (ALDARA) 5 % cream Apply topically 3 (three) times a week.    . Insulin Glargine (BASAGLAR KWIKPEN) 100 UNIT/ML SOPN Inject 0.46 mLs (46 Units total) into the skin at bedtime. (Patient taking differently: Inject 47 Units into the skin at bedtime. ) 5 pen 5  . metoprolol succinate (TOPROL-XL) 25 MG 24 hr tablet Take 0.5 tablets (12.5 mg total) by mouth daily. 3 tablet 0  . NOVOFINE PLUS 32G X 4 MM MISC USE FOR INJECTIONS INTO THE SKIN EVERY EVENING 100 each 1  . olmesartan-hydrochlorothiazide (BENICAR HCT) 20-12.5 MG tablet TAKE 0.5 TABLETS BY MOUTH DAILY. 45 tablet 1  . omega-3 acid ethyl esters (LOVAZA) 1 g capsule TAKE 1 CAPSULE (1 G TOTAL) 2 (TWO) TIMES DAILY BY MOUTH. 60 capsule 4  . ONETOUCH DELICA LANCETS FINE MISC Use to check blood sugar twice a day.  Dx  E11.8 200 each 1  . ONETOUCH VERIO test strip USE AS INSTRUCTED TO CHECK BLOOD SUGAR TWICE A DAY. DX E11.8 200 each 1  . OVER THE COUNTER MEDICATION OLIVE OIL. Take 1 tablespoon (1449m) every afternoon    . warfarin (COUMADIN) 7.5 MG tablet TAKE 1/2 TO 1 TABLET DAILY AS DIRECTED BY COUMADIN CLINIC 85 tablet 1   No current facility-administered medications on file prior to visit.     BP 116/62 (BP Location: Left Arm, Cuff Size: Large)   Pulse 61   Temp 98.4 F (36.9 C) (Oral)   Resp 18   Ht 5' 10"  (1.778 m)   Wt 221 lb (100.2 kg)   SpO2 98%   BMI 31.71 kg/m       Objective:   Physical Exam  Constitutional: He is oriented to  person, place, and time. He appears well-developed and well-nourished. No distress.  HENT:  Head: Normocephalic and atraumatic.  Cardiovascular: Normal rate and regular rhythm.  No murmur heard. Prosthetic valve click noted  Pulmonary/Chest: Effort normal and breath sounds normal. No respiratory distress. He has no wheezes. He has no rales.  Musculoskeletal: He exhibits no edema.  Neurological: He is alert and oriented to person, place, and time.  Skin: Skin is warm and dry.  Psychiatric: He has a normal mood and affect. His behavior is normal. Thought content normal.          Assessment & Plan:  Diabetes type 2-clinically stable.   Lab Results  Component Value Date   HGBA1C 7.5 (H) 08/29/2017   A1c is mildly elevated at 7.5.  Will increase his Basaglar from 46 to 50 units once daily.  Hyperlipidemia- Lab Results  Component Value Date   CHOL 144 08/29/2017   HDL 27.20 (L) 08/29/2017   LDLCALC 54 11/25/2010   LDLDIRECT 77.0 08/29/2017   TRIG 240.0 (H) 08/29/2017   CHOLHDL 5 08/29/2017   Triglycerides remain mildly elevated.  Hopefully with improvement in his glycemic control this will also improve.  Hypertension-blood pressure stable on current medications.  Continue same.

## 2017-08-30 MED ORDER — BASAGLAR KWIKPEN 100 UNIT/ML ~~LOC~~ SOPN: 50.0000 [IU] | PEN_INJECTOR | Freq: Every day | SUBCUTANEOUS | 5 refills | Status: DC

## 2017-08-30 NOTE — Telephone Encounter (Signed)
See mychart.  

## 2017-09-04 ENCOUNTER — Encounter: Payer: Self-pay | Admitting: Family

## 2017-09-05 NOTE — Telephone Encounter (Signed)
Above message copied and pasted into pt's actual medical record and forwarded to PCP.

## 2017-09-10 ENCOUNTER — Other Ambulatory Visit: Payer: Self-pay | Admitting: Family

## 2017-09-14 ENCOUNTER — Encounter: Payer: Self-pay | Admitting: Family

## 2017-09-16 DIAGNOSIS — R69 Illness, unspecified: Secondary | ICD-10-CM | POA: Diagnosis not present

## 2017-09-21 ENCOUNTER — Other Ambulatory Visit: Payer: Self-pay | Admitting: Family

## 2017-09-22 DIAGNOSIS — R69 Illness, unspecified: Secondary | ICD-10-CM | POA: Diagnosis not present

## 2017-09-25 ENCOUNTER — Ambulatory Visit (INDEPENDENT_AMBULATORY_CARE_PROVIDER_SITE_OTHER): Payer: Medicare HMO | Admitting: *Deleted

## 2017-09-25 DIAGNOSIS — Z952 Presence of prosthetic heart valve: Secondary | ICD-10-CM | POA: Diagnosis not present

## 2017-09-25 DIAGNOSIS — Z5181 Encounter for therapeutic drug level monitoring: Secondary | ICD-10-CM | POA: Diagnosis not present

## 2017-09-25 DIAGNOSIS — I359 Nonrheumatic aortic valve disorder, unspecified: Secondary | ICD-10-CM | POA: Diagnosis not present

## 2017-09-25 LAB — POCT INR: INR: 2.5 (ref 2.0–3.0)

## 2017-09-25 NOTE — Patient Instructions (Signed)
Description   Continue on same dosage of 1 tablet daily except 1/2 tablet on Tuesdays, Thursdays, Saturdays.  Recheck INR in 6 weeks.    

## 2017-09-27 ENCOUNTER — Other Ambulatory Visit: Payer: Medicare HMO

## 2017-09-27 ENCOUNTER — Ambulatory Visit: Payer: Medicare HMO | Admitting: Hematology & Oncology

## 2017-09-27 DIAGNOSIS — R69 Illness, unspecified: Secondary | ICD-10-CM | POA: Diagnosis not present

## 2017-10-02 ENCOUNTER — Encounter: Payer: Self-pay | Admitting: Family

## 2017-10-04 ENCOUNTER — Ambulatory Visit: Payer: Medicare HMO | Admitting: Family

## 2017-10-04 ENCOUNTER — Other Ambulatory Visit: Payer: Medicare HMO

## 2017-10-08 ENCOUNTER — Other Ambulatory Visit: Payer: Self-pay | Admitting: Family

## 2017-10-08 NOTE — Telephone Encounter (Signed)
[name] -- pt is requesting refill of basaglar. Rx from 08/30/17 did not transmit to pharmacy. See 10/02/17 pt email regarding dose and advise how many units pt should take daily?

## 2017-10-11 NOTE — Telephone Encounter (Signed)
Close encounter 

## 2017-10-13 ENCOUNTER — Encounter: Payer: Self-pay | Admitting: Family

## 2017-10-16 ENCOUNTER — Encounter: Payer: Self-pay | Admitting: Hematology & Oncology

## 2017-10-16 ENCOUNTER — Inpatient Hospital Stay: Payer: Medicare HMO | Attending: Hematology & Oncology

## 2017-10-16 ENCOUNTER — Inpatient Hospital Stay (HOSPITAL_BASED_OUTPATIENT_CLINIC_OR_DEPARTMENT_OTHER): Payer: Medicare HMO | Admitting: Hematology & Oncology

## 2017-10-16 ENCOUNTER — Other Ambulatory Visit: Payer: Self-pay

## 2017-10-16 DIAGNOSIS — Z794 Long term (current) use of insulin: Secondary | ICD-10-CM

## 2017-10-16 DIAGNOSIS — E119 Type 2 diabetes mellitus without complications: Secondary | ICD-10-CM | POA: Diagnosis not present

## 2017-10-16 DIAGNOSIS — Z923 Personal history of irradiation: Secondary | ICD-10-CM | POA: Diagnosis not present

## 2017-10-16 DIAGNOSIS — Z8546 Personal history of malignant neoplasm of prostate: Secondary | ICD-10-CM | POA: Insufficient documentation

## 2017-10-16 LAB — CBC WITH DIFFERENTIAL (CANCER CENTER ONLY)
BASOS PCT: 0 %
Basophils Absolute: 0 10*3/uL (ref 0.0–0.1)
Eosinophils Absolute: 0.2 10*3/uL (ref 0.0–0.5)
Eosinophils Relative: 3 %
HEMATOCRIT: 36.9 % — AB (ref 38.7–49.9)
HEMOGLOBIN: 12.6 g/dL — AB (ref 13.0–17.1)
LYMPHS PCT: 19 %
Lymphs Abs: 1.3 10*3/uL (ref 0.9–3.3)
MCH: 30.7 pg (ref 28.0–33.4)
MCHC: 34.1 g/dL (ref 32.0–35.9)
MCV: 89.8 fL (ref 82.0–98.0)
MONOS PCT: 7 %
Monocytes Absolute: 0.5 10*3/uL (ref 0.1–0.9)
NEUTROS ABS: 4.9 10*3/uL (ref 1.5–6.5)
NEUTROS PCT: 71 %
Platelet Count: 218 10*3/uL (ref 145–400)
RBC: 4.11 MIL/uL — ABNORMAL LOW (ref 4.20–5.70)
RDW: 12.9 % (ref 11.1–15.7)
WBC Count: 6.9 10*3/uL (ref 4.0–10.0)

## 2017-10-16 LAB — CMP (CANCER CENTER ONLY)
ALBUMIN: 3.9 g/dL (ref 3.5–5.0)
ALK PHOS: 49 U/L (ref 26–84)
ALT: 26 U/L (ref 10–47)
ANION GAP: 0 — AB (ref 5–15)
AST: 30 U/L (ref 11–38)
BILIRUBIN TOTAL: 0.6 mg/dL (ref 0.2–1.6)
BUN: 34 mg/dL — ABNORMAL HIGH (ref 7–22)
CO2: 29 mmol/L (ref 18–33)
CREATININE: 1.6 mg/dL — AB (ref 0.60–1.20)
Calcium: 9.2 mg/dL (ref 8.0–10.3)
Chloride: 106 mmol/L (ref 98–108)
Glucose, Bld: 200 mg/dL — ABNORMAL HIGH (ref 73–118)
Potassium: 4.5 mmol/L (ref 3.3–4.7)
Sodium: 135 mmol/L (ref 128–145)
Total Protein: 7.3 g/dL (ref 6.4–8.1)

## 2017-10-16 NOTE — Progress Notes (Signed)
Hematology and Oncology Follow Up Visit  [name] [id number] 03/04/1945 72 y.o. 10/16/2017   Principle Diagnosis:  1. Hemochromatosis (H63D homozygous mutation). 2. Stage I prostate cancer, status post radiation therapy and     radiation seeds.  Current Therapy:    Phlebotomy to maintain a ferritin less than 100      Interim History:  [name] is back for followup.  He is doing quite well.  We see him every 6 months.  So far, he is doing quite well.  Is had a good summer.  He has had no problems with nausea or vomiting.  He is diabetic.  He is trying to watch his blood sugars carefully.  As such, trying to watch his diet.  There is been no issues with nausea or vomiting.  He has had no problems with bowels or bladder.  His last iron studies showed a ferritin of 61 with an iron saturation of 24%.  Overall, his performance status is ECOG 1.    Medications:  Current Outpatient Medications:  .  amoxicillin (AMOXIL) 500 MG tablet, Take all 4 tablets one hour prior to procedure, Disp: 4 tablet, Rfl: 6 .  aspirin EC 81 MG tablet, Take 1 tablet (81 mg total) by mouth daily., Disp: 90 tablet, Rfl: 3 .  atorvastatin (LIPITOR) 20 MG tablet, Take 1 tablet (20 mg total) by mouth daily., Disp: 90 tablet, Rfl: 3 .  Blood Glucose Monitoring Suppl (ONETOUCH VERIO FLEX SYSTEM) w/Device KIT, 1 each by Does not apply route 2 (two) times daily., Disp: 1 kit, Rfl: 0 .  fenofibrate (TRICOR) 145 MG tablet, Take 1 tablet (145 mg total) by mouth daily., Disp: 90 tablet, Rfl: 1 .  furosemide (LASIX) 20 MG tablet, Take 1 tablet (20 mg total) by mouth daily as needed. For swelling, Disp: 90 tablet, Rfl: 1 .  imiquimod (ALDARA) 5 % cream, Apply topically 3 (three) times a week., Disp: , Rfl:  .  Insulin Glargine (BASAGLAR KWIKPEN) 100 UNIT/ML SOPN, Inject 0.5 mLs (50 Units total) into the skin at bedtime., Disp: 5 pen, Rfl: 5 .  Insulin Glargine (BASAGLAR KWIKPEN) 100 UNIT/ML SOPN, Inject 0.47 mLs  (47 Units total) into the skin at bedtime., Disp: 15 pen, Rfl: 1 .  metoprolol succinate (TOPROL-XL) 25 MG 24 hr tablet, Take 0.5 tablets (12.5 mg total) by mouth daily., Disp: 3 tablet, Rfl: 0 .  NOVOFINE PLUS 32G X 4 MM MISC, USE FOR INJECTIONS INTO THE SKIN EVERY EVENING, Disp: 100 each, Rfl: 1 .  olmesartan-hydrochlorothiazide (BENICAR HCT) 20-12.5 MG tablet, TAKE 0.5 TABLETS BY MOUTH DAILY., Disp: 45 tablet, Rfl: 1 .  omega-3 acid ethyl esters (LOVAZA) 1 g capsule, TAKE 1 CAPSULE (1 G TOTAL) 2 (TWO) TIMES DAILY BY MOUTH., Disp: 60 capsule, Rfl: 4 .  ONETOUCH DELICA LANCETS FINE MISC, Use to check blood sugar twice a day.  Dx  E11.8, Disp: 200 each, Rfl: 1 .  ONETOUCH VERIO test strip, USE AS INSTRUCTED TO CHECK BLOOD SUGAR TWICE A DAY. DX E11.8, Disp: 200 each, Rfl: 1 .  OVER THE COUNTER MEDICATION, OLIVE OIL. Take 1 tablespoon (1461m) every afternoon, Disp: , Rfl:  .  warfarin (COUMADIN) 7.5 MG tablet, TAKE 1/2 TO 1 TABLET DAILY AS DIRECTED BY COUMADIN CLINIC, Disp: 85 tablet, Rfl: 1  Allergies: No Known Allergies  Past Medical History, Surgical history, Social history, and Family History were reviewed and updated.  Review of Systems: Review of Systems  Constitutional: Negative.  HENT: Negative.   Eyes: Negative.   Respiratory: Negative.   Cardiovascular: Negative.   Gastrointestinal: Negative.   Genitourinary: Negative.   Musculoskeletal: Negative.   Skin: Negative.   Neurological: Negative.   Endo/Heme/Allergies: Negative.   Psychiatric/Behavioral: Negative.      Physical Exam:  vitals were not taken for this visit.   Physical Exam  Constitutional: He is oriented to person, place, and time.  HENT:  Head: Normocephalic and atraumatic.  Mouth/Throat: Oropharynx is clear and moist.  Eyes: Pupils are equal, round, and reactive to light. EOM are normal.  Neck: Normal range of motion.  Cardiovascular: Normal rate, regular rhythm and normal heart sounds.  Pulmonary/Chest:  Effort normal and breath sounds normal.  Abdominal: Soft. Bowel sounds are normal.  Musculoskeletal: Normal range of motion. He exhibits no edema, tenderness or deformity.  Lymphadenopathy:    He has no cervical adenopathy.  Neurological: He is alert and oriented to person, place, and time.  Skin: Skin is warm and dry. No rash noted. No erythema.  Psychiatric: He has a normal mood and affect. His behavior is normal. Judgment and thought content normal.  Vitals reviewed.   Lab Results  Component Value Date   WBC 6.9 10/16/2017   HGB 12.6 (L) 10/16/2017   HCT 36.9 (L) 10/16/2017   MCV 89.8 10/16/2017   PLT 218 10/16/2017     Chemistry      Component Value Date/Time   NA 139 08/29/2017 1207   NA 133 (L) 03/30/2016 1507   NA 138 09/23/2015 1020   K 4.6 08/29/2017 1207   K 4.3 03/30/2016 1507   K 4.4 09/23/2015 1020   CL 103 08/29/2017 1207   CL 99 03/30/2016 1507   CO2 31 08/29/2017 1207   CO2 25 03/30/2016 1507   CO2 25 09/23/2015 1020   BUN 39 (H) 08/29/2017 1207   BUN 31 (H) 03/30/2016 1507   BUN 28.4 (H) 09/23/2015 1020   CREATININE 1.89 (H) 08/29/2017 1207   CREATININE 1.30 (H) 03/30/2016 1507   CREATININE 1.3 09/23/2015 1020      Component Value Date/Time   CALCIUM 9.5 08/29/2017 1207   CALCIUM 10.1 03/30/2016 1507   CALCIUM 9.5 09/23/2015 1020   ALKPHOS 36 (L) 05/25/2017 1021   ALKPHOS 45 03/30/2016 1507   ALKPHOS 46 09/23/2015 1020   AST 26 05/25/2017 1021   AST 40 03/30/2016 1507   AST 41 (H) 09/23/2015 1020   ALT 18 05/25/2017 1021   ALT 29 03/30/2016 1507   ALT 29 09/23/2015 1020   BILITOT 0.5 05/25/2017 1021   BILITOT 0.3 03/30/2016 1507   BILITOT 0.46 09/23/2015 1020         Impression and Plan: [name] is a 85- gentleman with hemochromatosis. He is homozygous for the H63D mutation. He is doing quite well.  We will see what his iron studies show.  I would have to believe that they will still be low enough that he will not need to be  phlebotomized.  We will see him back in 6 more months.Volanda Napoleon, MD 9/17/20193:37 PM

## 2017-10-17 ENCOUNTER — Telehealth: Payer: Self-pay | Admitting: *Deleted

## 2017-10-17 LAB — IRON AND TIBC
IRON: 92 ug/dL (ref 42–163)
SATURATION RATIOS: 19 % — AB (ref 42–163)
TIBC: 486 ug/dL — AB (ref 202–409)
UIBC: 395 ug/dL

## 2017-10-17 LAB — FERRITIN: FERRITIN: 52 ng/mL (ref 24–336)

## 2017-10-17 NOTE — Telephone Encounter (Signed)
-----   Message from Volanda Napoleon, MD sent at 10/17/2017  2:28 PM EDT ----- Call - iron level is great!!!  No need for a phlebotomy!!  Laurey Arrow

## 2017-10-17 NOTE — Telephone Encounter (Signed)
Patient notified per order of Dr. Marin Olp that iron level is great and that there is no need for a phlebotomy at this time.  Patient appreciative of call and has no questions at this time.

## 2017-10-29 ENCOUNTER — Encounter: Payer: Self-pay | Admitting: Family

## 2017-10-29 MED ORDER — BASAGLAR KWIKPEN 100 UNIT/ML ~~LOC~~ SOPN: 45.0000 [IU] | PEN_INJECTOR | Freq: Every day | SUBCUTANEOUS | 5 refills | Status: DC

## 2017-11-06 ENCOUNTER — Ambulatory Visit: Payer: Medicare HMO | Admitting: *Deleted

## 2017-11-06 ENCOUNTER — Other Ambulatory Visit: Payer: Self-pay | Admitting: Family

## 2017-11-06 DIAGNOSIS — I359 Nonrheumatic aortic valve disorder, unspecified: Secondary | ICD-10-CM

## 2017-11-06 DIAGNOSIS — Z952 Presence of prosthetic heart valve: Secondary | ICD-10-CM

## 2017-11-06 DIAGNOSIS — Z5181 Encounter for therapeutic drug level monitoring: Secondary | ICD-10-CM

## 2017-11-06 DIAGNOSIS — I152 Hypertension secondary to endocrine disorders: Secondary | ICD-10-CM

## 2017-11-06 DIAGNOSIS — E1159 Type 2 diabetes mellitus with other circulatory complications: Secondary | ICD-10-CM

## 2017-11-06 DIAGNOSIS — I10 Essential (primary) hypertension: Principal | ICD-10-CM

## 2017-11-06 LAB — POCT INR: INR: 2.8 (ref 2.0–3.0)

## 2017-11-06 NOTE — Patient Instructions (Signed)
Description   Continue on same dosage of 1 tablet daily except 1/2 tablet on Tuesdays, Thursdays, Saturdays.  Recheck INR in 6 weeks.    

## 2017-11-07 ENCOUNTER — Other Ambulatory Visit: Payer: Self-pay | Admitting: Family

## 2017-11-12 DIAGNOSIS — C61 Malignant neoplasm of prostate: Secondary | ICD-10-CM | POA: Diagnosis not present

## 2017-11-21 DIAGNOSIS — C61 Malignant neoplasm of prostate: Secondary | ICD-10-CM | POA: Diagnosis not present

## 2017-11-23 ENCOUNTER — Other Ambulatory Visit: Payer: Self-pay | Admitting: Family

## 2017-11-26 ENCOUNTER — Other Ambulatory Visit: Payer: Self-pay | Admitting: Family

## 2017-11-29 ENCOUNTER — Encounter: Payer: Self-pay | Admitting: Family

## 2017-11-30 ENCOUNTER — Ambulatory Visit: Payer: Medicare HMO | Admitting: Family

## 2017-12-04 ENCOUNTER — Encounter: Payer: Self-pay | Admitting: Family

## 2017-12-04 ENCOUNTER — Ambulatory Visit (INDEPENDENT_AMBULATORY_CARE_PROVIDER_SITE_OTHER): Payer: Medicare HMO | Admitting: Family

## 2017-12-04 ENCOUNTER — Ambulatory Visit: Payer: Medicare HMO | Admitting: Family

## 2017-12-04 VITALS — BP 106/68 | HR 58 | Temp 98.4°F | Resp 16 | Ht 70.0 in | Wt 220.0 lb

## 2017-12-04 DIAGNOSIS — E785 Hyperlipidemia, unspecified: Secondary | ICD-10-CM | POA: Diagnosis not present

## 2017-12-04 DIAGNOSIS — I10 Essential (primary) hypertension: Secondary | ICD-10-CM | POA: Diagnosis not present

## 2017-12-04 DIAGNOSIS — L309 Dermatitis, unspecified: Secondary | ICD-10-CM

## 2017-12-04 DIAGNOSIS — E118 Type 2 diabetes mellitus with unspecified complications: Secondary | ICD-10-CM | POA: Diagnosis not present

## 2017-12-04 LAB — COMPREHENSIVE METABOLIC PANEL
ALBUMIN: 4.6 g/dL (ref 3.5–5.2)
ALK PHOS: 31 U/L — AB (ref 39–117)
ALT: 20 U/L (ref 0–53)
AST: 26 U/L (ref 0–37)
BUN: 42 mg/dL — AB (ref 6–23)
CALCIUM: 9.5 mg/dL (ref 8.4–10.5)
CHLORIDE: 100 meq/L (ref 96–112)
CO2: 28 mEq/L (ref 19–32)
Creatinine, Ser: 1.81 mg/dL — ABNORMAL HIGH (ref 0.40–1.50)
GFR: 39.29 mL/min — ABNORMAL LOW (ref >60.00)
Glucose, Bld: 101 mg/dL — ABNORMAL HIGH (ref 70–99)
Potassium: 4.4 mEq/L (ref 3.5–5.1)
Sodium: 136 mEq/L (ref 135–145)
Total Bilirubin: 0.5 mg/dL (ref 0.2–1.2)
Total Protein: 7.6 g/dL (ref 6.0–8.3)

## 2017-12-04 LAB — LIPID PANEL
CHOL/HDL RATIO: 5
CHOLESTEROL: 146 mg/dL (ref 0–200)
HDL: 31 mg/dL — ABNORMAL LOW (ref >39.00)
NonHDL: 115.35
Triglycerides: 209 mg/dL — ABNORMAL HIGH (ref 0.0–149.0)
VLDL: 41.8 mg/dL — ABNORMAL HIGH (ref 0.0–40.0)

## 2017-12-04 LAB — HEMOGLOBIN A1C: Hgb A1c MFr Bld: 7.2 % — ABNORMAL HIGH (ref 4.6–6.5)

## 2017-12-04 LAB — LDL CHOLESTEROL, DIRECT: LDL DIRECT: 80 mg/dL

## 2017-12-04 NOTE — Patient Instructions (Signed)
Please complete lab work prior to leaving.   

## 2017-12-04 NOTE — Progress Notes (Signed)
Subjective:    Patient ID: [name], male    DOB: [birth year], 72 y.o.   MRN: [id number]  HPI  Patient is a 72 yr old male who presents today for follow up  HTN- maintained on toprol xl, lasix, benicar hct. BP Readings from Last 3 Encounters:  12/04/17 106/68  10/16/17 (!) 150/67  08/29/17 116/62   DM2- on basaglar 47 units. Continues to work on his diet.  Lab Results  Component Value Date   HGBA1C 7.5 (H) 08/29/2017   HGBA1C 7.2 (H) 05/25/2017   HGBA1C 7.1 (H) 01/18/2017   Lab Results  Component Value Date   MICROALBUR 8.7 (H) 09/09/2014   LDLCALC 54 11/25/2010   CREATININE 1.60 (H) 10/16/2017   Notes that he has a mild right hand tremor which is most noticeable when he writes.  Notes that his son has a similar tremor. Denies any gait issues.    He also complains of some dry skin which continues to return behind his right ear.   Review of Systems See HPI  Past Medical History:  Diagnosis Date  . Allergy   . Aortic stenosis   . CAD (coronary artery disease)    cabg  . Cancer [facility]) 2011   prostate  . Erectile dysfunction   . Heart murmur   . Hemochromatosis 02/14/2011  . Hemorrhoids   . History of gout   . History of hepatitis 1974  . Hyperlipidemia   . Hypertension   . Hypertr obst cardiomyop   . Obesity    moderate  . Sensorineural hearing loss   . Type II or unspecified type diabetes mellitus without mention of complication, not stated as uncontrolled      Social History   Socioeconomic History  . Marital status: Married    Spouse name: Not on file  . Number of children: Not on file  . Years of education: Not on file  . Highest education level: Not on file  Occupational History  . Not on file  Social Needs  . Financial resource strain: Not on file  . Food insecurity:    Worry: Not on file    Inability: Not on file  . Transportation needs:    Medical: Not on file    Non-medical: Not on file  Tobacco Use  . Smoking status: Former  Smoker    Types: Cigars  . Smokeless tobacco: Never Used  . Tobacco comment: Never used Tobacco  Substance and Sexual Activity  . Alcohol use: Yes    Alcohol/week: 0.0 standard drinks    Comment: 4 glasses wine a month  . Drug use: No  . Sexual activity: Never  Lifestyle  . Physical activity:    Days per week: Not on file    Minutes per session: Not on file  . Stress: Not on file  Relationships  . Social connections:    Talks on phone: Not on file    Gets together: Not on file    Attends religious service: Not on file    Active member of club or organization: Not on file    Attends meetings of clubs or organizations: Not on file    Relationship status: Not on file  . Intimate partner violence:    Fear of current or ex partner: Not on file    Emotionally abused: Not on file    Physically abused: Not on file    Forced sexual activity: Not on file  Other Topics Concern  . Not  on file  Social History Narrative  . Not on file    Past Surgical History:  Procedure Laterality Date  . 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 APPENDECTOMY  1990  . CHOLECYSTECTOMY  1990  . CORONARY ARTERY BYPASS GRAFT  10/2003  . HERNIA REPAIR  1999   right, inguinal  . HERNIA REPAIR  2002   left, inguinal  . HIP SURGERY  2006   right hip  . PILONIDAL CYST EXCISION  1964  . prostate seed implant  3/12  . TONSILLECTOMY  childhood    Family History  Problem Relation Age of Onset  . Heart disease Mother   . Stroke Mother   . Heart disease Father   . Asperger's syndrome Son   . Hyperlipidemia Son   . Coronary artery disease Brother   . Cancer Neg Hx        negative for colon cancer    No Known Allergies  Current Outpatient Medications on File Prior to Visit  Medication Sig Dispense Refill  . amoxicillin (AMOXIL) 500 MG tablet Take all 4 tablets one hour prior to procedure 4 tablet 6  . aspirin EC 81 MG tablet Take 1 tablet (81 mg total) by mouth daily. 90 tablet 3  .  atorvastatin (LIPITOR) 20 MG tablet Take 1 tablet (20 mg total) by mouth daily. 90 tablet 3  . Blood Glucose Monitoring Suppl (ONETOUCH VERIO FLEX SYSTEM) w/Device KIT 1 each by Does not apply route 2 (two) times daily. 1 kit 0  . fenofibrate (TRICOR) 145 MG tablet TAKE 1 TABLET BY MOUTH EVERY DAY 90 tablet 1  . furosemide (LASIX) 20 MG tablet Take 1 tablet (20 mg total) by mouth daily as needed. For swelling 90 tablet 1  . imiquimod (ALDARA) 5 % cream Apply topically 3 (three) times a week.    . Insulin Glargine (BASAGLAR KWIKPEN) 100 UNIT/ML SOPN Inject 0.47 mLs (47 Units total) into the skin at bedtime. 15 pen 1  . Insulin Glargine (BASAGLAR KWIKPEN) 100 UNIT/ML SOPN Inject 0.45 mLs (45 Units total) into the skin at bedtime. 5 pen 5  . metoprolol succinate (TOPROL-XL) 25 MG 24 hr tablet TAKE 0.5 TABLETS (12.5 MG TOTAL) BY MOUTH DAILY. 45 tablet 1  . NOVOFINE PLUS 32G X 4 MM MISC USE FOR INJECTIONS INTO THE SKIN EVERY EVENING 100 each 1  . olmesartan-hydrochlorothiazide (BENICAR HCT) 20-12.5 MG tablet TAKE 0.5 TABLETS BY MOUTH DAILY. 45 tablet 1  . omega-3 acid ethyl esters (LOVAZA) 1 g capsule TAKE 1 CAPSULE (1 G TOTAL) 2 (TWO) TIMES DAILY BY MOUTH. 60 capsule 4  . ONETOUCH DELICA LANCETS FINE MISC Use to check blood sugar twice a day.  Dx  E11.8 200 each 1  . ONETOUCH VERIO test strip USE AS INSTRUCTED TO CHECK BLOOD SUGAR TWICE A DAY. DX E11.8 200 each 1  . OVER THE COUNTER MEDICATION OLIVE OIL. Take 1 tablespoon (1429m) every afternoon    . warfarin (COUMADIN) 7.5 MG tablet TAKE 1/2 TO 1 TABLET DAILY AS DIRECTED BY COUMADIN CLINIC 85 tablet 1   No current facility-administered medications on file prior to visit.     BP 106/68 (BP Location: Left Arm, Patient Position: Sitting, Cuff Size: Large)   Pulse (!) 58   Temp 98.4 F (36.9 C) (Oral)   Resp 16   Ht 5' 10"  (1.778 m)   Wt 220 lb (99.8 kg)   SpO2 98%   BMI 31.57 kg/m  Objective:   Physical Exam  Constitutional: He is  oriented to person, place, and time. He appears well-developed and well-nourished. No distress.  HENT:  Head: Normocephalic and atraumatic.  Cardiovascular: Normal rate and regular rhythm.  No murmur heard. Mechanical valve click noted.  Pulmonary/Chest: Effort normal and breath sounds normal. No respiratory distress. He has no wheezes. He has no rales.  Musculoskeletal:  2+ bilateral LE edema  Neurological: He is alert and oriented to person, place, and time.  Barely noticeable fine resting tremor in his right hand.   Skin: Skin is warm and dry.  Dry skin noted behind right pinna in crease near hea   Psychiatric: He has a normal mood and affect. His behavior is normal. Thought content normal.          Assessment & Plan:  Essential tremor- mild. Advised him this is most likely cause but if symptoms worsen we can always consider referral to neurology. Pt is agreeable to plan.  DM2- clinically stable. Obtain follow up A1C, cmet.   HTN- bp stable, continue current meds.    Eczema- advised pt to wash and remove dead skin daily and then apply a good moisturizer to the area. He is advised to follow up with his dermatologist if symptoms worsen or fail to improve.   Hyperlipidemia- continue statin/fibrate, lovaza, obtain follow up lipid panel.

## 2017-12-05 ENCOUNTER — Encounter: Payer: Self-pay | Admitting: Family

## 2017-12-06 ENCOUNTER — Other Ambulatory Visit: Payer: Self-pay | Admitting: Physician Assistant

## 2017-12-06 DIAGNOSIS — R011 Cardiac murmur, unspecified: Secondary | ICD-10-CM

## 2017-12-08 ENCOUNTER — Other Ambulatory Visit: Payer: Self-pay | Admitting: Family

## 2017-12-11 ENCOUNTER — Encounter: Payer: Self-pay | Admitting: Family

## 2017-12-11 MED ORDER — OLMESARTAN MEDOXOMIL-HCTZ 20-12.5 MG PO TABS: 0.5000 | ORAL_TABLET | Freq: Every day | ORAL | 1 refills | Status: DC

## 2017-12-20 ENCOUNTER — Ambulatory Visit (INDEPENDENT_AMBULATORY_CARE_PROVIDER_SITE_OTHER): Payer: Medicare HMO | Admitting: Pharmacist

## 2017-12-20 DIAGNOSIS — I359 Nonrheumatic aortic valve disorder, unspecified: Secondary | ICD-10-CM | POA: Diagnosis not present

## 2017-12-20 DIAGNOSIS — Z952 Presence of prosthetic heart valve: Secondary | ICD-10-CM

## 2017-12-20 LAB — POCT INR: INR: 2.6 (ref 2.0–3.0)

## 2017-12-20 NOTE — Patient Instructions (Signed)
Description   Continue on same dosage of 1 tablet daily except 1/2 tablet on Tuesdays, Thursdays, Saturdays.  Recheck INR in 6 weeks.

## 2017-12-30 ENCOUNTER — Encounter: Payer: Self-pay | Admitting: Family

## 2018-01-04 ENCOUNTER — Other Ambulatory Visit: Payer: Self-pay | Admitting: Family

## 2018-01-14 ENCOUNTER — Other Ambulatory Visit: Payer: Self-pay | Admitting: Family

## 2018-01-29 ENCOUNTER — Encounter: Payer: Self-pay | Admitting: Family

## 2018-01-31 ENCOUNTER — Ambulatory Visit: Payer: Medicare HMO

## 2018-01-31 DIAGNOSIS — Z952 Presence of prosthetic heart valve: Secondary | ICD-10-CM

## 2018-01-31 DIAGNOSIS — I359 Nonrheumatic aortic valve disorder, unspecified: Secondary | ICD-10-CM | POA: Diagnosis not present

## 2018-01-31 LAB — POCT INR: INR: 1.9 — AB (ref 2.0–3.0)

## 2018-01-31 NOTE — Patient Instructions (Signed)
Description   Take 1 tablet today, then resume same dosage of 1 tablet daily except 1/2 tablet on Tuesdays, Thursdays, Saturdays.  Recheck INR in 6 weeks.

## 2018-02-12 ENCOUNTER — Other Ambulatory Visit: Payer: Self-pay | Admitting: Cardiology

## 2018-02-18 ENCOUNTER — Telehealth: Payer: Self-pay | Admitting: Family

## 2018-02-18 MED ORDER — COLCHICINE 0.6 MG PO TABS: ORAL_TABLET | ORAL | 0 refills | Status: DC

## 2018-02-18 NOTE — Telephone Encounter (Signed)
Rx sent. Please notify pt and let him know that if symptoms fail to improve with colchicine, needs OV.

## 2018-02-18 NOTE — Telephone Encounter (Signed)
Patient advised rx was sent, he has appointment for follow up in 2 weeks.

## 2018-02-18 NOTE — Telephone Encounter (Signed)
Pt. Reports his right ankle hurts and "is a little red." Started Friday. "I know it's gout - I've had it before and I really don't want to come in with it hurting." Offered appointment again. Request Colchicine be sent to [facility] [address]. Please advise pt.

## 2018-02-18 NOTE — Telephone Encounter (Signed)
Pt called back in to follow up on request. Advised that Rx is awaiting approval from PCP. Pt would like to know if someone could call to let him know when Rx has been sent in to pharmacy

## 2018-02-18 NOTE — Telephone Encounter (Unsigned)
Copied from [place] [id number]. Topic: Quick Communication - Rx Refill/Question >> Feb 18, 2018  8:23 AM Scherrie Gerlach wrote: Medication: colchicine 0.6 MG tablet   Pt states he has gout in his right ankle, and would like a refill for this med.  Pt declined to make an appt, wanted me to ask the dr first for this med.  Advised pt he has not had this refilled in 2 yrs, but pt states it is hard for him to walk anyway, and prefers not to have to come in.  [id number] [place], [place] - [address] AT [address] [phone] (Phone) [phone] (Fax)

## 2018-02-20 DIAGNOSIS — Z952 Presence of prosthetic heart valve: Secondary | ICD-10-CM | POA: Diagnosis not present

## 2018-02-20 DIAGNOSIS — E1122 Type 2 diabetes mellitus with diabetic chronic kidney disease: Secondary | ICD-10-CM | POA: Diagnosis not present

## 2018-02-20 DIAGNOSIS — N183 Chronic kidney disease, stage 3 (moderate): Secondary | ICD-10-CM | POA: Diagnosis not present

## 2018-02-20 DIAGNOSIS — I129 Hypertensive chronic kidney disease with stage 1 through stage 4 chronic kidney disease, or unspecified chronic kidney disease: Secondary | ICD-10-CM | POA: Diagnosis not present

## 2018-02-27 DIAGNOSIS — G20 Parkinson's disease: Secondary | ICD-10-CM | POA: Diagnosis not present

## 2018-02-27 DIAGNOSIS — R69 Illness, unspecified: Secondary | ICD-10-CM | POA: Diagnosis not present

## 2018-02-27 DIAGNOSIS — I714 Abdominal aortic aneurysm, without rupture: Secondary | ICD-10-CM | POA: Diagnosis not present

## 2018-03-01 ENCOUNTER — Encounter: Payer: Self-pay | Admitting: Family

## 2018-03-04 ENCOUNTER — Other Ambulatory Visit: Payer: Self-pay | Admitting: Family

## 2018-03-06 ENCOUNTER — Encounter: Payer: Self-pay | Admitting: Family

## 2018-03-06 ENCOUNTER — Ambulatory Visit (INDEPENDENT_AMBULATORY_CARE_PROVIDER_SITE_OTHER): Payer: Medicare HMO | Admitting: Family

## 2018-03-06 VITALS — BP 117/57 | HR 61 | Temp 98.3°F | Resp 16 | Ht 70.0 in | Wt 226.0 lb

## 2018-03-06 DIAGNOSIS — E118 Type 2 diabetes mellitus with unspecified complications: Secondary | ICD-10-CM

## 2018-03-06 DIAGNOSIS — I10 Essential (primary) hypertension: Secondary | ICD-10-CM | POA: Diagnosis not present

## 2018-03-06 DIAGNOSIS — E1165 Type 2 diabetes mellitus with hyperglycemia: Secondary | ICD-10-CM | POA: Diagnosis not present

## 2018-03-06 DIAGNOSIS — IMO0002 Reserved for concepts with insufficient information to code with codable children: Secondary | ICD-10-CM

## 2018-03-06 NOTE — Patient Instructions (Signed)
Please complete lab work prior to leaving.   

## 2018-03-06 NOTE — Progress Notes (Signed)
Subjective:    Patient ID: [name], male    DOB: 03-26-1945, 73 y.o.   MRN: [id number]  HPI  DM2- reports sugars have been well controlled. He is maintained on basaglar 47 units.   Lab Results  Component Value Date   HGBA1C 7.2 (H) 12/04/2017   HGBA1C 7.5 (H) 08/29/2017   HGBA1C 7.2 (H) 05/25/2017   Lab Results  Component Value Date   MICROALBUR 8.7 (H) 09/09/2014   LDLCALC 54 11/25/2010   CREATININE 1.81 (H) 12/04/2017   HTN- maintained on toprol xl and benicar hct.  BP Readings from Last 3 Encounters:  03/06/18 (!) 117/57  12/04/17 106/68  10/16/17 (!) 150/67   Hyperlipidemia- maintained on tricor, fenofibrate and lipitor.  Lab Results  Component Value Date   CHOL 146 12/04/2017   HDL 31.00 (L) 12/04/2017   LDLCALC 54 11/25/2010   LDLDIRECT 80.0 12/04/2017   TRIG 209.0 (H) 12/04/2017   CHOLHDL 5 12/04/2017      Review of Systems See HPI  Past Medical History:  Diagnosis Date  . Allergy   . Aortic stenosis   . CAD (coronary artery disease)    cabg  . Cancer [facility]) 2011   prostate  . Erectile dysfunction   . Heart murmur   . Hemochromatosis 02/14/2011  . Hemorrhoids   . History of gout   . History of hepatitis 1974  . Hyperlipidemia   . Hypertension   . Hypertr obst cardiomyop   . Obesity    moderate  . Sensorineural hearing loss   . Type II or unspecified type diabetes mellitus without mention of complication, not stated as uncontrolled      Social History   Socioeconomic History  . Marital status: Married    Spouse name: Not on file  . Number of children: Not on file  . Years of education: Not on file  . Highest education level: Not on file  Occupational History  . Not on file  Social Needs  . Financial resource strain: Not on file  . Food insecurity:    Worry: Not on file    Inability: Not on file  . Transportation needs:    Medical: Not on file    Non-medical: Not on file  Tobacco Use  . Smoking status: Former Smoker   Types: Cigars  . Smokeless tobacco: Never Used  . Tobacco comment: Never used Tobacco  Substance and Sexual Activity  . Alcohol use: Yes    Alcohol/week: 0.0 standard drinks    Comment: 4 glasses wine a month  . Drug use: No  . Sexual activity: Never  Lifestyle  . Physical activity:    Days per week: Not on file    Minutes per session: Not on file  . Stress: Not on file  Relationships  . Social connections:    Talks on phone: Not on file    Gets together: Not on file    Attends religious service: Not on file    Active member of club or organization: Not on file    Attends meetings of clubs or organizations: Not on file    Relationship status: Not on file  . Intimate partner violence:    Fear of current or ex partner: Not on file    Emotionally abused: Not on file    Physically abused: Not on file    Forced sexual activity: Not on file  Other Topics Concern  . Not on file  Social History Narrative  .  Not on file    Past Surgical History:  Procedure Laterality Date  . 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 APPENDECTOMY  1990  . CHOLECYSTECTOMY  1990  . CORONARY ARTERY BYPASS GRAFT  10/2003  . HERNIA REPAIR  1999   right, inguinal  . HERNIA REPAIR  2002   left, inguinal  . HIP SURGERY  2006   right hip  . PILONIDAL CYST EXCISION  1964  . prostate seed implant  3/12  . TONSILLECTOMY  childhood    Family History  Problem Relation Age of Onset  . Heart disease Mother   . Stroke Mother   . Heart disease Father   . Asperger's syndrome Son   . Hyperlipidemia Son   . Coronary artery disease Brother   . Cancer Neg Hx        negative for colon cancer    No Known Allergies  Current Outpatient Medications on File Prior to Visit  Medication Sig Dispense Refill  . amoxicillin (AMOXIL) 500 MG tablet Take all 4 tablets one hour prior to procedure 4 tablet 6  . aspirin EC 81 MG tablet Take 1 tablet (81 mg total) by mouth daily. 90 tablet 3  . atorvastatin  (LIPITOR) 20 MG tablet Take 1 tablet (20 mg total) by mouth daily. 90 tablet 3  . Blood Glucose Monitoring Suppl (ONETOUCH VERIO FLEX SYSTEM) w/Device KIT 1 each by Does not apply route 2 (two) times daily. 1 kit 0  . colchicine 0.6 MG tablet Take 2 tabs by mouth now and then 1 tab in 1 hour for gout. 3 tablet 0  . fenofibrate (TRICOR) 145 MG tablet TAKE 1 TABLET BY MOUTH EVERY DAY 90 tablet 1  . furosemide (LASIX) 20 MG tablet Take 1 tablet (20 mg total) by mouth daily as needed. For swelling 90 tablet 1  . imiquimod (ALDARA) 5 % cream Apply topically 3 (three) times a week.    . Insulin Glargine (BASAGLAR KWIKPEN) 100 UNIT/ML SOPN INJECT 47 UNITS INTO THE SKIN AT BEDTIME. 45 mL 1  . metoprolol succinate (TOPROL-XL) 25 MG 24 hr tablet TAKE 0.5 TABLETS (12.5 MG TOTAL) BY MOUTH DAILY. 45 tablet 1  . NOVOFINE PLUS 32G X 4 MM MISC USE FOR INJECTIONS INTO THE SKIN EVERY EVENING 100 each 1  . olmesartan-hydrochlorothiazide (BENICAR HCT) 20-12.5 MG tablet Take 0.5 tablets by mouth daily. 45 tablet 1  . omega-3 acid ethyl esters (LOVAZA) 1 g capsule TAKE 1 CAPSULE BY MOUTH TWICE A DAY 180 capsule 1  . ONETOUCH DELICA LANCETS FINE MISC Use to check blood sugar twice a day.  Dx  E11.8 200 each 1  . ONETOUCH VERIO test strip USE AS INSTRUCTED TO CHECK BLOOD SUGAR TWICE A DAY. DX E11.8 100 each 4  . OVER THE COUNTER MEDICATION OLIVE OIL. Take 1 tablespoon (1427m) every afternoon    . warfarin (COUMADIN) 7.5 MG tablet TAKE 1/2 TO 1 TABLET DAILY AS DIRECTED BY COUMADIN CLINIC 85 tablet 1   No current facility-administered medications on file prior to visit.     BP (!) 117/57 (BP Location: Right Arm, Patient Position: Sitting, Cuff Size: Small)   Pulse 61   Temp 98.3 F (36.8 C) (Oral)   Resp 16   Ht _0  (1.778 m)   Wt 226 lb (102.5 kg)   SpO2 100%   BMI 32.43 kg/m       Objective:   Physical Exam Constitutional:  General: He is not in acute distress.    Appearance: He is  well-developed.  HENT:     Head: Normocephalic and atraumatic.  Cardiovascular:     Rate and Rhythm: Normal rate and regular rhythm.     Heart sounds: No murmur.  Pulmonary:     Effort: Pulmonary effort is normal. No respiratory distress.     Breath sounds: Normal breath sounds. No wheezing or rales.  Musculoskeletal:     Comments: 2-3+ bilateral LE edema  Skin:    General: Skin is warm and dry.  Neurological:     Mental Status: He is alert and oriented to person, place, and time.  Psychiatric:        Behavior: Behavior normal.        Thought Content: Thought content normal.           Assessment & Plan:  DM2- clinically stable. Obtain follow up A1C.  Reports that he had a flu shot at [facility] at the end of the summer.  Hyperlipidemia- LDL at goal, continue current meds.  HTN- bp stable on current meds. Continue same.  Obtain follow up bmet.

## 2018-03-12 ENCOUNTER — Other Ambulatory Visit (INDEPENDENT_AMBULATORY_CARE_PROVIDER_SITE_OTHER): Payer: Medicare HMO

## 2018-03-12 DIAGNOSIS — E118 Type 2 diabetes mellitus with unspecified complications: Secondary | ICD-10-CM | POA: Diagnosis not present

## 2018-03-12 LAB — BASIC METABOLIC PANEL
BUN: 44 mg/dL — ABNORMAL HIGH (ref 6–23)
CO2: 27 mEq/L (ref 19–32)
CREATININE: 1.79 mg/dL — AB (ref 0.40–1.50)
Calcium: 9.2 mg/dL (ref 8.4–10.5)
Chloride: 100 mEq/L (ref 96–112)
GFR: 37.41 mL/min — ABNORMAL LOW (ref >60.00)
Glucose, Bld: 127 mg/dL — ABNORMAL HIGH (ref 70–99)
Potassium: 4.7 mEq/L (ref 3.5–5.1)
Sodium: 136 mEq/L (ref 135–145)

## 2018-03-12 LAB — HEMOGLOBIN A1C: Hgb A1c MFr Bld: 8.2 % — ABNORMAL HIGH (ref 4.6–6.5)

## 2018-03-12 NOTE — Addendum Note (Signed)
Addended by: Caffie Pinto on: 03/12/2018 10:57 AM   Modules accepted: Orders

## 2018-03-13 ENCOUNTER — Telehealth: Payer: Self-pay | Admitting: Family

## 2018-03-13 MED ORDER — BASAGLAR KWIKPEN 100 UNIT/ML ~~LOC~~ SOPN: PEN_INJECTOR | SUBCUTANEOUS | 1 refills | Status: DC

## 2018-03-13 NOTE — Telephone Encounter (Signed)
Is this ok?

## 2018-03-13 NOTE — Telephone Encounter (Signed)
Per patient he is using 45 units at this time, he said he will increase to 45 units.

## 2018-03-13 NOTE — Telephone Encounter (Signed)
A1C has risen to 8.2. I would like him to increase basaglar from 47 units/day to 52 units/day and continue to work on diet and exercise.

## 2018-03-14 ENCOUNTER — Ambulatory Visit (INDEPENDENT_AMBULATORY_CARE_PROVIDER_SITE_OTHER): Payer: Medicare HMO

## 2018-03-14 DIAGNOSIS — I359 Nonrheumatic aortic valve disorder, unspecified: Secondary | ICD-10-CM

## 2018-03-14 DIAGNOSIS — Z952 Presence of prosthetic heart valve: Secondary | ICD-10-CM | POA: Diagnosis not present

## 2018-03-14 LAB — POCT INR: INR: 1.7 — AB (ref 2.0–3.0)

## 2018-03-14 NOTE — Telephone Encounter (Signed)
I would like him to increase by 5 units/day please. So if he is using 45 units, he should increase to 50 units.

## 2018-03-14 NOTE — Telephone Encounter (Signed)
Patient will increase rx by 5 units from 45 to 50.

## 2018-03-14 NOTE — Patient Instructions (Signed)
Description   Start taking 1 tablet daily except 1/2 tablet on Tuesdays and Saturdays.  Recheck INR in 3 weeks.

## 2018-03-15 ENCOUNTER — Encounter: Payer: Self-pay | Admitting: Family

## 2018-03-25 DIAGNOSIS — G43809 Other migraine, not intractable, without status migrainosus: Secondary | ICD-10-CM | POA: Diagnosis not present

## 2018-03-25 DIAGNOSIS — H01004 Unspecified blepharitis left upper eyelid: Secondary | ICD-10-CM | POA: Diagnosis not present

## 2018-03-25 DIAGNOSIS — E119 Type 2 diabetes mellitus without complications: Secondary | ICD-10-CM | POA: Diagnosis not present

## 2018-03-25 DIAGNOSIS — H01001 Unspecified blepharitis right upper eyelid: Secondary | ICD-10-CM | POA: Diagnosis not present

## 2018-03-25 LAB — HM DIABETES EYE EXAM

## 2018-03-26 ENCOUNTER — Encounter: Payer: Self-pay | Admitting: Family

## 2018-03-26 DIAGNOSIS — D225 Melanocytic nevi of trunk: Secondary | ICD-10-CM | POA: Diagnosis not present

## 2018-03-26 DIAGNOSIS — L814 Other melanin hyperpigmentation: Secondary | ICD-10-CM | POA: Diagnosis not present

## 2018-03-26 DIAGNOSIS — L570 Actinic keratosis: Secondary | ICD-10-CM | POA: Diagnosis not present

## 2018-03-26 DIAGNOSIS — Z08 Encounter for follow-up examination after completed treatment for malignant neoplasm: Secondary | ICD-10-CM | POA: Diagnosis not present

## 2018-03-26 DIAGNOSIS — Z85828 Personal history of other malignant neoplasm of skin: Secondary | ICD-10-CM | POA: Diagnosis not present

## 2018-03-26 DIAGNOSIS — L218 Other seborrheic dermatitis: Secondary | ICD-10-CM | POA: Diagnosis not present

## 2018-03-27 ENCOUNTER — Telehealth: Payer: Self-pay | Admitting: *Deleted

## 2018-03-27 NOTE — Telephone Encounter (Signed)
Received Diabetic Eye Exam Report from [facility]; forwarded to provider/SLS 02/26  

## 2018-04-01 ENCOUNTER — Encounter: Payer: Self-pay | Admitting: Family

## 2018-04-09 ENCOUNTER — Ambulatory Visit (INDEPENDENT_AMBULATORY_CARE_PROVIDER_SITE_OTHER): Payer: Medicare HMO | Admitting: Pharmacist

## 2018-04-09 DIAGNOSIS — Z952 Presence of prosthetic heart valve: Secondary | ICD-10-CM | POA: Diagnosis not present

## 2018-04-09 DIAGNOSIS — I359 Nonrheumatic aortic valve disorder, unspecified: Secondary | ICD-10-CM

## 2018-04-09 LAB — POCT INR: INR: 3.1 — AB (ref 2.0–3.0)

## 2018-04-09 NOTE — Patient Instructions (Signed)
Continue taking 1 tablet daily except 1/2 tablet on Tuesdays and Saturdays.  Recheck INR in 3 weeks.

## 2018-04-11 ENCOUNTER — Encounter: Payer: Self-pay | Admitting: Family

## 2018-04-16 ENCOUNTER — Telehealth: Payer: Self-pay | Admitting: Hematology & Oncology

## 2018-04-16 ENCOUNTER — Encounter: Payer: Self-pay | Admitting: Family

## 2018-04-16 ENCOUNTER — Other Ambulatory Visit: Payer: Self-pay

## 2018-04-16 ENCOUNTER — Inpatient Hospital Stay: Payer: Medicare HMO

## 2018-04-16 ENCOUNTER — Inpatient Hospital Stay: Payer: Medicare HMO | Attending: Family | Admitting: Family

## 2018-04-16 DIAGNOSIS — Z923 Personal history of irradiation: Secondary | ICD-10-CM | POA: Diagnosis not present

## 2018-04-16 DIAGNOSIS — Z8546 Personal history of malignant neoplasm of prostate: Secondary | ICD-10-CM

## 2018-04-16 LAB — CBC WITH DIFFERENTIAL (CANCER CENTER ONLY)
Abs Immature Granulocytes: 0.03 10*3/uL (ref 0.00–0.07)
Basophils Absolute: 0.1 10*3/uL (ref 0.0–0.1)
Basophils Relative: 1 %
EOS ABS: 0.1 10*3/uL (ref 0.0–0.5)
Eosinophils Relative: 2 %
HCT: 38.6 % — ABNORMAL LOW (ref 39.0–52.0)
Hemoglobin: 13.4 g/dL (ref 13.0–17.0)
Immature Granulocytes: 0 %
Lymphocytes Relative: 14 %
Lymphs Abs: 1.4 10*3/uL (ref 0.7–4.0)
MCH: 31.2 pg (ref 26.0–34.0)
MCHC: 34.7 g/dL (ref 30.0–36.0)
MCV: 89.8 fL (ref 80.0–100.0)
Monocytes Absolute: 0.6 10*3/uL (ref 0.1–1.0)
Monocytes Relative: 7 %
NRBC: 0 % (ref 0.0–0.2)
Neutro Abs: 7.2 10*3/uL (ref 1.7–7.7)
Neutrophils Relative %: 76 %
Platelet Count: 244 10*3/uL (ref 150–400)
RBC: 4.3 MIL/uL (ref 4.22–5.81)
RDW: 12.9 % (ref 11.5–15.5)
WBC Count: 9.5 10*3/uL (ref 4.0–10.5)

## 2018-04-16 LAB — CMP (CANCER CENTER ONLY)
ALT: 21 U/L (ref 0–44)
AST: 23 U/L (ref 15–41)
Albumin: 4.6 g/dL (ref 3.5–5.0)
Alkaline Phosphatase: 46 U/L (ref 38–126)
Anion gap: 8 (ref 5–15)
BILIRUBIN TOTAL: 0.4 mg/dL (ref 0.3–1.2)
BUN: 36 mg/dL — ABNORMAL HIGH (ref 8–23)
CO2: 29 mmol/L (ref 22–32)
Calcium: 9.5 mg/dL (ref 8.9–10.3)
Chloride: 99 mmol/L (ref 98–111)
Creatinine: 1.78 mg/dL — ABNORMAL HIGH (ref 0.61–1.24)
GFR, EST NON AFRICAN AMERICAN: 37 mL/min — AB (ref >60)
GFR, Est AFR Am: 43 mL/min — ABNORMAL LOW (ref >60)
Glucose, Bld: 211 mg/dL — ABNORMAL HIGH (ref 70–99)
POTASSIUM: 4.6 mmol/L (ref 3.5–5.1)
Sodium: 136 mmol/L (ref 135–145)
Total Protein: 7.6 g/dL (ref 6.5–8.1)

## 2018-04-16 NOTE — Progress Notes (Signed)
Hematology and Oncology Follow Up Visit  [name] [id number] 08-Mar-1945 73 y.o. 04/16/2018   Principle Diagnosis:  Hemochromatosis (H63D homozygous mutation) Stage I prostate cancer, status post radiation therapy and radiation seeds  Current Therapy:   Phlebotomy to maintain a ferritin less than 100    Interim History:  [name] is here today with his wife for follow-up. He is doing well but has had some pain in the right knee. He is followed closely by [name] and have some fluid removed off that knee.  He has had no other joint aches and pains.  No fever, chills, n/v, cough, rash, dizziness, SOB, chest pain, palpitations, abdominal pain or changes in bowel or bladder habits.  The puffiness in his feet and ankles is unchanged from baseline.  No numbness or tingling in her extremities.  No lymphadenopathy noted on exam.  No episodes of bleeding, no bruising or petechiae.  He has maintained a good appetite and is staying well hydrated. His weight is stable.   ECOG Performance Status: 1 - Symptomatic but completely ambulatory  Medications:  Allergies as of 04/16/2018   No Known Allergies     Medication List       Accurate as of April 16, 2018  3:09 PM. Always use your most recent med list.        amoxicillin 500 MG tablet Commonly known as:  AMOXIL Take all 4 tablets one hour prior to procedure   aspirin EC 81 MG tablet Take 1 tablet (81 mg total) by mouth daily.   atorvastatin 20 MG tablet Commonly known as:  LIPITOR Take 1 tablet (20 mg total) by mouth daily.   Basaglar KwikPen 100 UNIT/ML Sopn INJECT 52 UNITS INTO THE SKIN AT BEDTIME.   colchicine 0.6 MG tablet Take 2 tabs by mouth now and then 1 tab in 1 hour for gout.   fenofibrate 145 MG tablet Commonly known as:  TRICOR TAKE 1 TABLET BY MOUTH EVERY DAY   furosemide 20 MG tablet Commonly known as:  LASIX Take 1 tablet (20 mg total) by mouth daily as needed. For swelling   imiquimod 5 %  cream Commonly known as:  ALDARA Apply topically 3 (three) times a week.   metoprolol succinate 25 MG 24 hr tablet Commonly known as:  TOPROL-XL TAKE 0.5 TABLETS (12.5 MG TOTAL) BY MOUTH DAILY.   NovoFine Plus 32G X 4 MM Misc Generic drug:  Insulin Pen Needle USE FOR INJECTIONS INTO THE SKIN EVERY EVENING   olmesartan-hydrochlorothiazide 20-12.5 MG tablet Commonly known as:  BENICAR HCT Take 0.5 tablets by mouth daily.   omega-3 acid ethyl esters 1 g capsule Commonly known as:  LOVAZA TAKE 1 CAPSULE BY MOUTH TWICE A DAY   OneTouch Delica Lancets Fine Misc Use to check blood sugar twice a day.  Dx  E11.8   OneTouch Verio Flex System w/Device Kit 1 each by Does not apply route 2 (two) times daily.   OneTouch Verio test strip Generic drug:  glucose blood USE AS INSTRUCTED TO CHECK BLOOD SUGAR TWICE A DAY. DX E11.8   OVER THE COUNTER MEDICATION OLIVE OIL. Take 1 tablespoon (1474m) every afternoon   warfarin 7.5 MG tablet Commonly known as:  COUMADIN Take as directed by the anticoagulation clinic. If you are unsure how to take this medication, talk to your nurse or doctor. Original instructions:  TAKE 1/2 TO 1 TABLET DAILY AS DIRECTED BY COUMADIN CLINIC       Allergies: No Known  Allergies  Past Medical History, Surgical history, Social history, and Family History were reviewed and updated.  Review of Systems: All other 10 point review of systems is negative.   Physical Exam:  vitals were not taken for this visit.   Wt Readings from Last 3 Encounters:  03/06/18 226 lb (102.5 kg)  12/04/17 220 lb (99.8 kg)  10/16/17 219 lb (99.3 kg)    Ocular: Sclerae unicteric, pupils equal, round and reactive to light Ear-nose-throat: Oropharynx clear, dentition fair Lymphatic: No cervical, supraclavicular or axillary adenopathy Lungs no rales or rhonchi, good excursion bilaterally Heart regular rate and rhythm, no murmur appreciated Abd soft, nontender, positive bowel  sounds, no liver or spleen tip palpated on exam, no fluid wave  MSK no focal spinal tenderness, no joint edema Neuro: non-focal, well-oriented, appropriate affect Breasts: Deferred   Lab Results  Component Value Date   WBC 6.9 10/16/2017   HGB 12.6 (L) 10/16/2017   HCT 36.9 (L) 10/16/2017   MCV 89.8 10/16/2017   PLT 218 10/16/2017   Lab Results  Component Value Date   FERRITIN 52 10/16/2017   IRON 92 10/16/2017   TIBC 486 (H) 10/16/2017   UIBC 395 10/16/2017   IRONPCTSAT 19 (L) 10/16/2017   Lab Results  Component Value Date   RBC 4.11 (L) 10/16/2017   No results found for: KPAFRELGTCHN, LAMBDASER, KAPLAMBRATIO No results found for: IGGSERUM, IGA, IGMSERUM No results found for: Odetta Pink, SPEI   Chemistry      Component Value Date/Time   NA 136 03/12/2018 1058   NA 133 (L) 03/30/2016 1507   NA 138 09/23/2015 1020   K 4.7 03/12/2018 1058   K 4.3 03/30/2016 1507   K 4.4 09/23/2015 1020   CL 100 03/12/2018 1058   CL 99 03/30/2016 1507   CO2 27 03/12/2018 1058   CO2 25 03/30/2016 1507   CO2 25 09/23/2015 1020   BUN 44 (H) 03/12/2018 1058   BUN 31 (H) 03/30/2016 1507   BUN 28.4 (H) 09/23/2015 1020   CREATININE 1.79 (H) 03/12/2018 1058   CREATININE 1.60 (H) 10/16/2017 1520   CREATININE 1.30 (H) 03/30/2016 1507   CREATININE 1.3 09/23/2015 1020      Component Value Date/Time   CALCIUM 9.2 03/12/2018 1058   CALCIUM 10.1 03/30/2016 1507   CALCIUM 9.5 09/23/2015 1020   ALKPHOS 31 (L) 12/04/2017 1311   ALKPHOS 45 03/30/2016 1507   ALKPHOS 46 09/23/2015 1020   AST 26 12/04/2017 1311   AST 30 10/16/2017 1520   AST 41 (H) 09/23/2015 1020   ALT 20 12/04/2017 1311   ALT 26 10/16/2017 1520   ALT 29 09/23/2015 1020   BILITOT 0.5 12/04/2017 1311   BILITOT 0.6 10/16/2017 1520   BILITOT 0.46 09/23/2015 1020       Impression and Plan: [name] is a very pleasant 73 yo caucasian gentleman with hemochromatosis,  homozygous for the H63D mutation.  He continues to do well. We will see what his iron studies show and bring him back in for phlebotomy if needed.  We will plan to see him back in another 6 months.  They will contact our office with any questions or concerns. We can certainly see him sooner if need be.   David Peace, NP 3/17/20203:09 PM

## 2018-04-16 NOTE — Telephone Encounter (Signed)
Appointments scheduled letter/calendar mailed per 3/17 los

## 2018-04-17 ENCOUNTER — Ambulatory Visit: Payer: Medicare HMO | Admitting: Family Medicine

## 2018-04-17 ENCOUNTER — Encounter: Payer: Self-pay | Admitting: Family Medicine

## 2018-04-17 ENCOUNTER — Telehealth: Payer: Self-pay | Admitting: Hematology & Oncology

## 2018-04-17 ENCOUNTER — Other Ambulatory Visit: Payer: Self-pay

## 2018-04-17 VITALS — BP 163/74 | HR 56 | Ht 70.0 in | Wt 225.0 lb

## 2018-04-17 DIAGNOSIS — M25561 Pain in right knee: Secondary | ICD-10-CM

## 2018-04-17 LAB — IRON AND TIBC
Iron: 75 ug/dL (ref 42–163)
SATURATION RATIOS: 16 % — AB (ref 20–55)
TIBC: 480 ug/dL — AB (ref 202–409)
UIBC: 405 ug/dL — ABNORMAL HIGH (ref 117–376)

## 2018-04-17 LAB — FERRITIN: Ferritin: 58 ng/mL (ref 24–336)

## 2018-04-17 NOTE — Progress Notes (Signed)
PCP: [name], NP  Subjective:   HPI: Patient is a 73 y.o. male here for right knee pain.  Patient reports he's had anterior right knee pain for the past 2 weeks. No acute injury or trauma. He was walking around [place] a lot when this started. Also cramped into car with driving. Pain is achy, worse with bending. Level 6/10. Has been icing. No skin changes, numbness  Past Medical History:  Diagnosis Date  . Allergy   . Aortic stenosis   . CAD (coronary artery disease)    cabg  . Cancer [facility]) 2011   prostate  . Erectile dysfunction   . Heart murmur   . Hemochromatosis 02/14/2011  . Hemorrhoids   . History of gout   . History of hepatitis 1974  . Hyperlipidemia   . Hypertension   . Hypertr obst cardiomyop   . Obesity    moderate  . Sensorineural hearing loss   . Type II or unspecified type diabetes mellitus without mention of complication, not stated as uncontrolled     Current Outpatient Medications on File Prior to Visit  Medication Sig Dispense Refill  . amoxicillin (AMOXIL) 500 MG tablet Take all 4 tablets one hour prior to procedure 4 tablet 6  . aspirin EC 81 MG tablet Take 1 tablet (81 mg total) by mouth daily. 90 tablet 3  . atorvastatin (LIPITOR) 20 MG tablet Take 1 tablet (20 mg total) by mouth daily. 90 tablet 3  . Blood Glucose Monitoring Suppl (ONETOUCH VERIO FLEX SYSTEM) w/Device KIT 1 each by Does not apply route 2 (two) times daily. 1 kit 0  . colchicine 0.6 MG tablet Take 2 tabs by mouth now and then 1 tab in 1 hour for gout. 3 tablet 0  . fenofibrate (TRICOR) 145 MG tablet TAKE 1 TABLET BY MOUTH EVERY DAY 90 tablet 1  . furosemide (LASIX) 20 MG tablet Take 1 tablet (20 mg total) by mouth daily as needed. For swelling 90 tablet 1  . imiquimod (ALDARA) 5 % cream Apply topically 3 (three) times a week.    . Insulin Glargine (BASAGLAR KWIKPEN) 100 UNIT/ML SOPN INJECT 52 UNITS INTO THE SKIN AT BEDTIME. 45 mL 1  . metoprolol succinate (TOPROL-XL) 25 MG  24 hr tablet TAKE 0.5 TABLETS (12.5 MG TOTAL) BY MOUTH DAILY. 45 tablet 1  . NOVOFINE PLUS 32G X 4 MM MISC USE FOR INJECTIONS INTO THE SKIN EVERY EVENING 100 each 1  . olmesartan-hydrochlorothiazide (BENICAR HCT) 20-12.5 MG tablet Take 0.5 tablets by mouth daily. 45 tablet 1  . omega-3 acid ethyl esters (LOVAZA) 1 g capsule TAKE 1 CAPSULE BY MOUTH TWICE A DAY 180 capsule 1  . ONETOUCH DELICA LANCETS FINE MISC Use to check blood sugar twice a day.  Dx  E11.8 200 each 1  . ONETOUCH VERIO test strip USE AS INSTRUCTED TO CHECK BLOOD SUGAR TWICE A DAY. DX E11.8 100 each 4  . OVER THE COUNTER MEDICATION OLIVE OIL. Take 1 tablespoon (1448m) every afternoon    . warfarin (COUMADIN) 7.5 MG tablet TAKE 1/2 TO 1 TABLET DAILY AS DIRECTED BY COUMADIN CLINIC 85 tablet 1   No current facility-administered medications on file prior to visit.     Past Surgical History:  Procedure Laterality Date  . AORTIC VALVE REPLACEMENT     [employer]ile [employer] . APPENDECTOMY  1990  . CHOLECYSTECTOMY  1990  . CORONARY ARTERY BYPASS GRAFT  10/2003  . [place]  right,  inguinal  . HERNIA REPAIR  2002   left, inguinal  . HIP SURGERY  2006   right hip  . PILONIDAL CYST EXCISION  1964  . prostate seed implant  3/12  . TONSILLECTOMY  childhood    No Known Allergies  Social History   Socioeconomic History  . Marital status: Married    Spouse name: Not on file  . Number of children: Not on file  . Years of education: Not on file  . Highest education level: Not on file  Occupational History  . Not on file  Social Needs  . Financial resource strain: Not on file  . Food insecurity:    Worry: Not on file    Inability: Not on file  . Transportation needs:    Medical: Not on file    Non-medical: Not on file  Tobacco Use  . Smoking status: Former Smoker    Types: Cigars  . Smokeless tobacco: Never Used  . Tobacco comment: Never used Tobacco  Substance and Sexual Activity  . Alcohol use: Yes     Alcohol/week: 0.0 standard drinks    Comment: 4 glasses wine a month  . Drug use: No  . Sexual activity: Never  Lifestyle  . Physical activity:    Days per week: Not on file    Minutes per session: Not on file  . Stress: Not on file  Relationships  . Social connections:    Talks on phone: Not on file    Gets together: Not on file    Attends religious service: Not on file    Active member of club or organization: Not on file    Attends meetings of clubs or organizations: Not on file    Relationship status: Not on file  . Intimate partner violence:    Fear of current or ex partner: Not on file    Emotionally abused: Not on file    Physically abused: Not on file    Forced sexual activity: Not on file  Other Topics Concern  . Not on file  Social History Narrative  . Not on file    Family History  Problem Relation Age of Onset  . Heart disease Mother   . Stroke Mother   . Heart disease Father   . Asperger's syndrome Son   . Hyperlipidemia Son   . Coronary artery disease Brother   . Cancer Neg Hx        negative for colon cancer    BP (!) 163/74   Pulse (!) 56   Ht 5' 10"  (1.778 m)   Wt 225 lb (102.1 kg)   BMI 32.28 kg/m   Review of Systems: See HPI above.     Objective:  Physical Exam:  Gen: NAD, comfortable in exam room  Right knee: Mod effusion.  No other gross deformity, ecchymoses. Mild TTP suprapatellar pouch.  No other tenderness. ROM 0 - 100 degrees with 5/5 strength. Negative ant/post drawers. Negative valgus/varus testing. Negative lachmans. Negative mcmurrays, apleys, patellar apprehension. NV intact distally.  Left knee: No deformity. FROM with 5/5 strength. No tenderness to palpation. NVI distally.   Assessment & Plan:  1. Right knee pain - with anechoic effusion confirmed on ultrasound.  2/2 flare of arthritis and synovitis from increase in activity level.  Discussed tylenol, topical aspercreme, icing, compression, elevation.  Consider  aspiration and injection if not improving.

## 2018-04-17 NOTE — Patient Instructions (Signed)
Your pain is due to arthritis and synovitis from all the walking in [place]. These are the different medications you can take for this: Tylenol 500mg  1-2 tabs three times a day for pain. Aspercreme topically up to four times a day may also help with pain and inflammation. Cortisone injections are an option after draining your knee but you should get better without needing this. It's important that you continue to stay active. Compression sleeve when up and walking around. Elevate above your heart as much as possible. Ice 15 minutes at a time 3-4 times a day as needed to help with pain. Follow up with me as needed but call me if this isn't improving and you want to go ahead with aspiration and injection of this knee.

## 2018-04-17 NOTE — Telephone Encounter (Signed)
Appointments scheduled letter/calendar mailed per 3/17 los

## 2018-04-18 ENCOUNTER — Telehealth: Payer: Self-pay | Admitting: *Deleted

## 2018-04-18 NOTE — Telephone Encounter (Signed)
Patient notified per order of Dr. Marin Olp that iron level is still ok and that there is no need for phlebotomy at this time.  Pt appreciative of call and has no questions or concerns at this time.

## 2018-04-18 NOTE — Telephone Encounter (Signed)
-----   Message from Volanda Napoleon, MD sent at 04/18/2018 10:36 AM EDT ----- Call - the iron level is still ok!!! NO need for phlebootmy!!!  Laurey Arrow

## 2018-04-29 ENCOUNTER — Other Ambulatory Visit: Payer: Self-pay

## 2018-04-29 ENCOUNTER — Ambulatory Visit (INDEPENDENT_AMBULATORY_CARE_PROVIDER_SITE_OTHER): Payer: Medicare HMO | Admitting: Family Medicine

## 2018-04-29 ENCOUNTER — Encounter: Payer: Self-pay | Admitting: Family Medicine

## 2018-04-29 VITALS — BP 151/85 | HR 57 | Temp 97.9°F | Ht 69.0 in | Wt 223.4 lb

## 2018-04-29 DIAGNOSIS — M25561 Pain in right knee: Secondary | ICD-10-CM

## 2018-04-29 MED ORDER — METHYLPREDNISOLONE ACETATE 40 MG/ML IJ SUSP
40.0000 mg | Freq: Once | INTRAMUSCULAR | Status: AC
  Administered 2018-04-29: 40 mg via INTRA_ARTICULAR

## 2018-04-29 NOTE — Progress Notes (Signed)
PCP: [name], NP  Subjective:   HPI: Patient is a 73 y.o. male here for right knee pain.  3/18: Patient reports he's had anterior right knee pain for the past 2 weeks. No acute injury or trauma. He was walking around [place] a lot when this started. Also cramped into car with driving. Pain is achy, worse with bending. Level 6/10. Has been icing. No skin changes, numbness  3/30: Patient presents for follow-up for right knee pain.  He notes significant improvement from prior visit but does continue to have 3/10 pain.  His pain is worse with raising his leg or going up and down stairs.  He has been using Tylenol and compression which is somewhat helpful.  He has not been icing.  He reports continued swelling although this is also improved.  No erythema or bruising.  No overlying skin changes.  No numbness tingling.  Past Medical History:  Diagnosis Date  . Allergy   . Aortic stenosis   . CAD (coronary artery disease)    cabg  . Cancer [facility]) 2011   prostate  . Erectile dysfunction   . Heart murmur   . Hemochromatosis 02/14/2011  . Hemorrhoids   . History of gout   . History of hepatitis 1974  . Hyperlipidemia   . Hypertension   . Hypertr obst cardiomyop   . Obesity    moderate  . Sensorineural hearing loss   . Type II or unspecified type diabetes mellitus without mention of complication, not stated as uncontrolled     Current Outpatient Medications on File Prior to Visit  Medication Sig Dispense Refill  . amoxicillin (AMOXIL) 500 MG tablet Take all 4 tablets one hour prior to procedure 4 tablet 6  . aspirin EC 81 MG tablet Take 1 tablet (81 mg total) by mouth daily. 90 tablet 3  . atorvastatin (LIPITOR) 20 MG tablet Take 1 tablet (20 mg total) by mouth daily. 90 tablet 3  . Blood Glucose Monitoring Suppl (ONETOUCH VERIO FLEX SYSTEM) w/Device KIT 1 each by Does not apply route 2 (two) times daily. 1 kit 0  . colchicine 0.6 MG tablet Take 2 tabs by mouth now and then  1 tab in 1 hour for gout. 3 tablet 0  . fenofibrate (TRICOR) 145 MG tablet TAKE 1 TABLET BY MOUTH EVERY DAY 90 tablet 1  . furosemide (LASIX) 20 MG tablet Take 1 tablet (20 mg total) by mouth daily as needed. For swelling 90 tablet 1  . imiquimod (ALDARA) 5 % cream Apply topically 3 (three) times a week.    . Insulin Glargine (BASAGLAR KWIKPEN) 100 UNIT/ML SOPN INJECT 52 UNITS INTO THE SKIN AT BEDTIME. 45 mL 1  . metoprolol succinate (TOPROL-XL) 25 MG 24 hr tablet TAKE 0.5 TABLETS (12.5 MG TOTAL) BY MOUTH DAILY. 45 tablet 1  . NOVOFINE PLUS 32G X 4 MM MISC USE FOR INJECTIONS INTO THE SKIN EVERY EVENING 100 each 1  . olmesartan-hydrochlorothiazide (BENICAR HCT) 20-12.5 MG tablet Take 0.5 tablets by mouth daily. 45 tablet 1  . omega-3 acid ethyl esters (LOVAZA) 1 g capsule TAKE 1 CAPSULE BY MOUTH TWICE A DAY 180 capsule 1  . ONETOUCH DELICA LANCETS FINE MISC Use to check blood sugar twice a day.  Dx  E11.8 200 each 1  . ONETOUCH VERIO test strip USE AS INSTRUCTED TO CHECK BLOOD SUGAR TWICE A DAY. DX E11.8 100 each 4  . OVER THE COUNTER MEDICATION OLIVE OIL. Take 1 tablespoon (1477m) every afternoon    .  warfarin (COUMADIN) 7.5 MG tablet TAKE 1/2 TO 1 TABLET DAILY AS DIRECTED BY COUMADIN CLINIC 85 tablet 1   No current facility-administered medications on file prior to visit.     Past Surgical History:  Procedure Laterality Date  . 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 APPENDECTOMY  1990  . CHOLECYSTECTOMY  1990  . CORONARY ARTERY BYPASS GRAFT  10/2003  . HERNIA REPAIR  1999   right, inguinal  . HERNIA REPAIR  2002   left, inguinal  . HIP SURGERY  2006   right hip  . PILONIDAL CYST EXCISION  1964  . prostate seed implant  3/12  . TONSILLECTOMY  childhood    No Known Allergies  Social History   Socioeconomic History  . Marital status: Married    Spouse name: Not on file  . Number of children: Not on file  . Years of education: Not on file  . Highest education level:  Not on file  Occupational History  . Not on file  Social Needs  . Financial resource strain: Not on file  . Food insecurity:    Worry: Not on file    Inability: Not on file  . Transportation needs:    Medical: Not on file    Non-medical: Not on file  Tobacco Use  . Smoking status: Former Smoker    Types: Cigars  . Smokeless tobacco: Never Used  . Tobacco comment: Never used Tobacco  Substance and Sexual Activity  . Alcohol use: Yes    Alcohol/week: 0.0 standard drinks    Comment: 4 glasses wine a month  . Drug use: No  . Sexual activity: Never  Lifestyle  . Physical activity:    Days per week: Not on file    Minutes per session: Not on file  . Stress: Not on file  Relationships  . Social connections:    Talks on phone: Not on file    Gets together: Not on file    Attends religious service: Not on file    Active member of club or organization: Not on file    Attends meetings of clubs or organizations: Not on file    Relationship status: Not on file  . Intimate partner violence:    Fear of current or ex partner: Not on file    Emotionally abused: Not on file    Physically abused: Not on file    Forced sexual activity: Not on file  Other Topics Concern  . Not on file  Social History Narrative  . Not on file    Family History  Problem Relation Age of Onset  . Heart disease Mother   . Stroke Mother   . Heart disease Father   . Asperger's syndrome Son   . Hyperlipidemia Son   . Coronary artery disease Brother   . Cancer Neg Hx        negative for colon cancer    BP (!) 151/85   Pulse (!) 57   Temp 97.9 F (36.6 C) (Oral)   Ht _0  (1.753 m)   Wt 223 lb 6.4 oz (101.3 kg)   BMI 32.99 kg/m   Review of Systems: See HPI above.     Objective:  Physical Exam:  GEN: Awake, alert, no acute distress Pulmonary: Breathing unlabored  Right knee: - Inspection: No gross deformity.  Mild effusion. No erythema or bruising. Skin intact - Palpation: Mild  tenderness along the joint line medially and laterally -  ROM: full active ROM with flexion and extension in knee - Strength: 5/5 strength - Neuro/vasc: NV intact  MSK [name]: Limited ultrasound of the right knee shows persistent mild effusion  Left knee: No deformity.  No swelling No tenderness Full range of motion N/V intact  Assessment & Plan:  1.  Right knee pain-secondary to arthritis.  Overall he is improving.  Steroid injection performed today provided further relief.  Follow-up as needed  Procedure performed: knee intraarticular corticosteroid injection; US guided Consent obtained and verified. Time-out conducted. Noted no overlying erythema, induration, or other signs of local infection. The Right superior lateral joint space was identified on ultrasound and marked. The overlying skin was prepped in a sterile fashion. Topical analgesic spray: Ethyl chloride. Joint: Right knee Needle: 25GA, 1.5" Completed without difficulty. Meds: depomedrol 65m bupivicaine 3cc

## 2018-04-29 NOTE — Patient Instructions (Signed)
You were given an injection today. Continue with elevation, icing, compression if needed to help with the swelling. The injection should kick in in a few days and you should have an easier time doing stairs, straightening your leg. Call me if you have any problems (or mychart message) otherwise follow up as needed.

## 2018-04-30 ENCOUNTER — Telehealth: Payer: Self-pay | Admitting: Pharmacist

## 2018-04-30 ENCOUNTER — Encounter: Payer: Self-pay | Admitting: Family

## 2018-04-30 NOTE — Telephone Encounter (Signed)

## 2018-05-01 ENCOUNTER — Ambulatory Visit (INDEPENDENT_AMBULATORY_CARE_PROVIDER_SITE_OTHER): Payer: Medicare HMO | Admitting: Pharmacist

## 2018-05-01 ENCOUNTER — Other Ambulatory Visit: Payer: Self-pay

## 2018-05-01 DIAGNOSIS — I359 Nonrheumatic aortic valve disorder, unspecified: Secondary | ICD-10-CM | POA: Diagnosis not present

## 2018-05-01 DIAGNOSIS — Z952 Presence of prosthetic heart valve: Secondary | ICD-10-CM

## 2018-05-01 LAB — POCT INR: INR: 4.7 — AB (ref 2.0–3.0)

## 2018-05-03 ENCOUNTER — Encounter: Payer: Self-pay | Admitting: Family

## 2018-05-06 ENCOUNTER — Other Ambulatory Visit: Payer: Self-pay

## 2018-05-06 DIAGNOSIS — I152 Hypertension secondary to endocrine disorders: Secondary | ICD-10-CM

## 2018-05-06 DIAGNOSIS — I10 Essential (primary) hypertension: Principal | ICD-10-CM

## 2018-05-06 DIAGNOSIS — E1159 Type 2 diabetes mellitus with other circulatory complications: Secondary | ICD-10-CM

## 2018-05-06 MED ORDER — METOPROLOL SUCCINATE ER 25 MG PO TB24: 12.5000 mg | ORAL_TABLET | Freq: Every day | ORAL | 1 refills | Status: DC

## 2018-05-13 ENCOUNTER — Encounter: Payer: Self-pay | Admitting: Family

## 2018-05-13 MED ORDER — FUROSEMIDE 20 MG PO TABS: 20.0000 mg | ORAL_TABLET | Freq: Every day | ORAL | 1 refills | Status: DC | PRN

## 2018-05-14 ENCOUNTER — Other Ambulatory Visit: Payer: Self-pay | Admitting: Family

## 2018-05-14 DIAGNOSIS — I10 Essential (primary) hypertension: Principal | ICD-10-CM

## 2018-05-14 DIAGNOSIS — E1159 Type 2 diabetes mellitus with other circulatory complications: Secondary | ICD-10-CM

## 2018-05-14 DIAGNOSIS — I152 Hypertension secondary to endocrine disorders: Secondary | ICD-10-CM

## 2018-05-16 ENCOUNTER — Encounter: Payer: Self-pay | Admitting: Family

## 2018-05-16 DIAGNOSIS — E1159 Type 2 diabetes mellitus with other circulatory complications: Secondary | ICD-10-CM

## 2018-05-16 DIAGNOSIS — I152 Hypertension secondary to endocrine disorders: Secondary | ICD-10-CM

## 2018-05-16 DIAGNOSIS — I10 Essential (primary) hypertension: Principal | ICD-10-CM

## 2018-05-17 MED ORDER — METOPROLOL SUCCINATE ER 25 MG PO TB24: 12.5000 mg | ORAL_TABLET | Freq: Every day | ORAL | 1 refills | Status: DC

## 2018-05-20 ENCOUNTER — Telehealth: Payer: Self-pay

## 2018-05-20 NOTE — Telephone Encounter (Signed)

## 2018-05-22 ENCOUNTER — Ambulatory Visit (INDEPENDENT_AMBULATORY_CARE_PROVIDER_SITE_OTHER): Payer: Medicare HMO

## 2018-05-22 ENCOUNTER — Other Ambulatory Visit: Payer: Self-pay

## 2018-05-22 DIAGNOSIS — Z952 Presence of prosthetic heart valve: Secondary | ICD-10-CM | POA: Diagnosis not present

## 2018-05-22 DIAGNOSIS — I359 Nonrheumatic aortic valve disorder, unspecified: Secondary | ICD-10-CM | POA: Diagnosis not present

## 2018-05-22 DIAGNOSIS — Z7901 Long term (current) use of anticoagulants: Secondary | ICD-10-CM | POA: Insufficient documentation

## 2018-05-22 DIAGNOSIS — Z5181 Encounter for therapeutic drug level monitoring: Secondary | ICD-10-CM

## 2018-05-22 HISTORY — DX: Long term (current) use of anticoagulants: Z79.01

## 2018-05-22 LAB — POCT INR: INR: 2.9 (ref 2.0–3.0)

## 2018-05-22 NOTE — Patient Instructions (Signed)
Description   Called spoke with pt, advised to continue on same dosage 1 tablet daily except 1/2 tablet on Tuesdays and Saturdays.  Recheck INR in 4 weeks. [phone] [facility]

## 2018-05-23 ENCOUNTER — Encounter: Payer: Self-pay | Admitting: Family

## 2018-05-23 MED ORDER — FENOFIBRATE 145 MG PO TABS: 145.0000 mg | ORAL_TABLET | Freq: Every day | ORAL | 1 refills | Status: DC

## 2018-05-29 ENCOUNTER — Encounter: Payer: Self-pay | Admitting: Family

## 2018-05-31 ENCOUNTER — Encounter: Payer: Self-pay | Admitting: Family

## 2018-06-03 ENCOUNTER — Telehealth (INDEPENDENT_AMBULATORY_CARE_PROVIDER_SITE_OTHER): Payer: Medicare HMO | Admitting: Family

## 2018-06-03 ENCOUNTER — Other Ambulatory Visit: Payer: Self-pay

## 2018-06-03 DIAGNOSIS — Z7901 Long term (current) use of anticoagulants: Secondary | ICD-10-CM | POA: Diagnosis not present

## 2018-06-03 DIAGNOSIS — E118 Type 2 diabetes mellitus with unspecified complications: Secondary | ICD-10-CM | POA: Diagnosis not present

## 2018-06-03 DIAGNOSIS — M1A9XX0 Chronic gout, unspecified, without tophus (tophi): Secondary | ICD-10-CM

## 2018-06-03 DIAGNOSIS — I10 Essential (primary) hypertension: Secondary | ICD-10-CM | POA: Diagnosis not present

## 2018-06-03 DIAGNOSIS — E785 Hyperlipidemia, unspecified: Secondary | ICD-10-CM | POA: Diagnosis not present

## 2018-06-03 DIAGNOSIS — E1165 Type 2 diabetes mellitus with hyperglycemia: Secondary | ICD-10-CM | POA: Diagnosis not present

## 2018-06-03 DIAGNOSIS — IMO0002 Reserved for concepts with insufficient information to code with codable children: Secondary | ICD-10-CM

## 2018-06-03 MED ORDER — AMOXICILLIN 500 MG PO TABS: ORAL_TABLET | ORAL | 6 refills | Status: DC

## 2018-06-03 NOTE — Telephone Encounter (Signed)
Patient set up for virtual visit today 

## 2018-06-03 NOTE — Progress Notes (Signed)
Virtual Visit via Video Note  I connected with [name] on 06/03/18 at 10:20 AM EDT by a video enabled telemedicine application and verified that I am speaking with the correct person using two identifiers. This visit type was conducted due to national recommendations for restrictions regarding the COVID-19 Pandemic (e.g. social distancing).  This format is felt to be most appropriate for this patient at this time.   I discussed the limitations of evaluation and management by telemedicine and the availability of in person appointments. The patient expressed understanding and agreed to proceed.  Only the patient and myself were on today's video visit. The patient was at home and I was in my office at the time of today's visit.   We started the visit on video but due to technical difficulties we unfortunately had to transition to telephone.    History of Present Illness:  Patient reprots that his neck is a lot better.   Knee pain- reports that he has been Icing 1-2 times a day. Using cream.  Knee still hurts but much better. Plans to see sports med after COVID   DM2- (see pt blood sugars under media tab 5/1). Lab Results  Component Value Date   HGBA1C 8.2 (H) 03/12/2018   HGBA1C 7.2 (H) 12/04/2017   HGBA1C 7.5 (H) 08/29/2017   Lab Results  Component Value Date   MICROALBUR 8.7 (H) 09/09/2014   LDLCALC 54 11/25/2010   CREATININE 1.78 (H) 04/16/2018   HTN- reports that swelling is "the same."    BP Readings from Last 3 Encounters:  04/29/18 (!) 151/85  04/17/18 (!) 163/74  04/16/18 (!) 159/87   He denies gout flares. Has colchicine on hand for prn use.   Hyperlipidemia- maintained on lipitor 20 mg.  Lab Results  Component Value Date   CHOL 146 12/04/2017   HDL 31.00 (L) 12/04/2017   LDLCALC 54 11/25/2010   LDLDIRECT 80.0 12/04/2017   TRIG 209.0 (H) 12/04/2017   CHOLHDL 5 12/04/2017    Chronic anticoagulation- continues to follow with cardiology/coumadin clinic.    Lab Results  Component Value Date   INR 2.9 05/22/2018   INR 4.7 (A) 05/01/2018   INR 3.1 (A) 04/09/2018        Observations/Objective:  Gen: Awake, alert, no acute distress Resp: Breathing is even and non-labored Psych: calm/pleasant demeanor Neuro: Alert and Oriented x 3, + facial symmetry, speech is clear.    Assessment and Plan: 1) HTN- bp is stable on current meds. Continue same.  2) DM2- stable sugars. Continue current meds/doses. Plan A1C at next face to face visit.  3) Gout- stable. Monitor.   4) Hyperlipidemia- LDL at goal, continue statin.   5) chronic anticoagulation-most recent INR therapeutic- management per coumadin clinic.  A total of 20 minutes in all spent with patient.  Follow Up Instructions:   3 months for a face to face visit with labs.  I discussed the assessment and treatment plan with the patient. The patient was provided an opportunity to ask questions and all were answered. The patient agreed with the plan and demonstrated an understanding of the instructions.   The patient was advised to call back or seek an in-person evaluation if the symptoms worsen or if the condition fails to improve as anticipated.    Nance Pear, NP

## 2018-06-05 ENCOUNTER — Encounter: Payer: Self-pay | Admitting: Family

## 2018-06-06 ENCOUNTER — Other Ambulatory Visit: Payer: Self-pay

## 2018-06-06 DIAGNOSIS — R011 Cardiac murmur, unspecified: Secondary | ICD-10-CM

## 2018-06-06 MED ORDER — OMEGA-3-ACID ETHYL ESTERS 1 G PO CAPS: 1.0000 | ORAL_CAPSULE | Freq: Two times a day (BID) | ORAL | 1 refills | Status: DC

## 2018-06-12 ENCOUNTER — Other Ambulatory Visit: Payer: Self-pay | Admitting: Family

## 2018-06-13 ENCOUNTER — Other Ambulatory Visit: Payer: Self-pay | Admitting: Family

## 2018-06-18 ENCOUNTER — Telehealth: Payer: Self-pay

## 2018-06-18 NOTE — Telephone Encounter (Signed)

## 2018-06-19 ENCOUNTER — Other Ambulatory Visit: Payer: Self-pay

## 2018-06-19 ENCOUNTER — Ambulatory Visit (INDEPENDENT_AMBULATORY_CARE_PROVIDER_SITE_OTHER): Payer: Medicare HMO | Admitting: Pharmacist

## 2018-06-19 ENCOUNTER — Telehealth: Payer: Self-pay | Admitting: Pharmacist

## 2018-06-19 DIAGNOSIS — Z7901 Long term (current) use of anticoagulants: Secondary | ICD-10-CM

## 2018-06-19 DIAGNOSIS — I359 Nonrheumatic aortic valve disorder, unspecified: Secondary | ICD-10-CM

## 2018-06-19 DIAGNOSIS — Z952 Presence of prosthetic heart valve: Secondary | ICD-10-CM

## 2018-06-19 LAB — POCT INR: INR: 4.8 — AB (ref 2.0–3.0)

## 2018-06-19 NOTE — Telephone Encounter (Signed)
New Message ° ° ° °Pt is returning call  ° ° ° °Please call back  °

## 2018-06-19 NOTE — Telephone Encounter (Signed)
Addressed in separate anticoag encounter.

## 2018-06-21 ENCOUNTER — Encounter: Payer: Self-pay | Admitting: Family

## 2018-06-28 ENCOUNTER — Telehealth: Payer: Self-pay | Admitting: Pharmacist

## 2018-06-28 NOTE — Telephone Encounter (Signed)
1. COVID-19 Pre-Screening Questions:  . In the past 7 to 10 days have you had a cough,  shortness of breath, headache, congestion, fever (100 or greater) body aches, chills, sore throat, or sudden loss of taste or sense of smell? . Have you been around anyone with known Covid 19. . Have you been around anyone who is awaiting Covid 19 test results in the past 7 to 10 days? . Have you been around anyone who has been exposed to Covid 19, or has mentioned symptoms of Covid 19 within the past 7 to 10 days? No to all questions  2. Pt advised of visitor restrictions (no visitors allowed except if needed to conduct the visit). Also advised to arrive at appointment time and wear a mask.     

## 2018-06-30 ENCOUNTER — Encounter: Payer: Self-pay | Admitting: Family

## 2018-06-30 DIAGNOSIS — E1159 Type 2 diabetes mellitus with other circulatory complications: Secondary | ICD-10-CM

## 2018-06-30 DIAGNOSIS — I152 Hypertension secondary to endocrine disorders: Secondary | ICD-10-CM

## 2018-07-01 ENCOUNTER — Encounter: Payer: Self-pay | Admitting: Family

## 2018-07-01 ENCOUNTER — Telehealth: Payer: Self-pay

## 2018-07-01 MED ORDER — METOPROLOL SUCCINATE ER 25 MG PO TB24: 25.0000 mg | ORAL_TABLET | Freq: Every day | ORAL | 1 refills | Status: DC

## 2018-07-01 NOTE — Telephone Encounter (Signed)
lmom for prescreen  

## 2018-07-02 ENCOUNTER — Telehealth: Payer: Self-pay | Admitting: Cardiology

## 2018-07-02 NOTE — Telephone Encounter (Signed)
New Message            Patient is confirming his INR appointment for Wednesday. Just an [place]

## 2018-07-02 NOTE — Telephone Encounter (Signed)
Just FYI. Thanks!  

## 2018-07-03 ENCOUNTER — Ambulatory Visit (INDEPENDENT_AMBULATORY_CARE_PROVIDER_SITE_OTHER): Payer: Medicare HMO | Admitting: Pharmacist

## 2018-07-03 ENCOUNTER — Other Ambulatory Visit: Payer: Self-pay

## 2018-07-03 DIAGNOSIS — I359 Nonrheumatic aortic valve disorder, unspecified: Secondary | ICD-10-CM

## 2018-07-03 DIAGNOSIS — Z952 Presence of prosthetic heart valve: Secondary | ICD-10-CM

## 2018-07-03 DIAGNOSIS — Z7901 Long term (current) use of anticoagulants: Secondary | ICD-10-CM | POA: Diagnosis not present

## 2018-07-03 LAB — POCT INR: INR: 2.6 (ref 2.0–3.0)

## 2018-07-09 ENCOUNTER — Encounter (HOSPITAL_BASED_OUTPATIENT_CLINIC_OR_DEPARTMENT_OTHER): Payer: Self-pay | Admitting: Emergency Medicine

## 2018-07-09 ENCOUNTER — Other Ambulatory Visit: Payer: Self-pay

## 2018-07-09 ENCOUNTER — Emergency Department (HOSPITAL_BASED_OUTPATIENT_CLINIC_OR_DEPARTMENT_OTHER)
Admission: EM | Admit: 2018-07-09 | Discharge: 2018-07-10 | Disposition: A | Discharge status: Home / Self Care | Payer: Medicare HMO | Attending: Emergency Medicine | Admitting: Emergency Medicine

## 2018-07-09 ENCOUNTER — Encounter: Payer: Self-pay | Admitting: Family

## 2018-07-09 DIAGNOSIS — Z7982 Long term (current) use of aspirin: Secondary | ICD-10-CM | POA: Insufficient documentation

## 2018-07-09 DIAGNOSIS — M25519 Pain in unspecified shoulder: Secondary | ICD-10-CM

## 2018-07-09 DIAGNOSIS — Z87891 Personal history of nicotine dependence: Secondary | ICD-10-CM | POA: Diagnosis not present

## 2018-07-09 DIAGNOSIS — Z7901 Long term (current) use of anticoagulants: Secondary | ICD-10-CM | POA: Insufficient documentation

## 2018-07-09 DIAGNOSIS — Z952 Presence of prosthetic heart valve: Secondary | ICD-10-CM | POA: Insufficient documentation

## 2018-07-09 DIAGNOSIS — I2510 Atherosclerotic heart disease of native coronary artery without angina pectoris: Secondary | ICD-10-CM | POA: Insufficient documentation

## 2018-07-09 DIAGNOSIS — Z8546 Personal history of malignant neoplasm of prostate: Secondary | ICD-10-CM | POA: Diagnosis not present

## 2018-07-09 DIAGNOSIS — Z794 Long term (current) use of insulin: Secondary | ICD-10-CM | POA: Diagnosis not present

## 2018-07-09 DIAGNOSIS — M19019 Primary osteoarthritis, unspecified shoulder: Secondary | ICD-10-CM

## 2018-07-09 DIAGNOSIS — M6530 Trigger finger, unspecified finger: Secondary | ICD-10-CM

## 2018-07-09 DIAGNOSIS — M25512 Pain in left shoulder: Secondary | ICD-10-CM | POA: Diagnosis present

## 2018-07-09 DIAGNOSIS — Z951 Presence of aortocoronary bypass graft: Secondary | ICD-10-CM | POA: Diagnosis not present

## 2018-07-09 DIAGNOSIS — Z79899 Other long term (current) drug therapy: Secondary | ICD-10-CM | POA: Insufficient documentation

## 2018-07-09 DIAGNOSIS — E119 Type 2 diabetes mellitus without complications: Secondary | ICD-10-CM | POA: Diagnosis not present

## 2018-07-09 DIAGNOSIS — M19012 Primary osteoarthritis, left shoulder: Secondary | ICD-10-CM | POA: Diagnosis not present

## 2018-07-09 DIAGNOSIS — I10 Essential (primary) hypertension: Secondary | ICD-10-CM | POA: Diagnosis not present

## 2018-07-09 DIAGNOSIS — R52 Pain, unspecified: Secondary | ICD-10-CM

## 2018-07-09 NOTE — ED Triage Notes (Signed)
Pt c/o left shoulder pain after lifting rail [address] timber x 2 days ago.

## 2018-07-10 ENCOUNTER — Emergency Department (HOSPITAL_BASED_OUTPATIENT_CLINIC_OR_DEPARTMENT_OTHER): Payer: Medicare HMO

## 2018-07-10 ENCOUNTER — Emergency Department: Payer: Medicare HMO

## 2018-07-10 ENCOUNTER — Ambulatory Visit (INDEPENDENT_AMBULATORY_CARE_PROVIDER_SITE_OTHER): Payer: Medicare HMO | Admitting: Family Medicine

## 2018-07-10 ENCOUNTER — Ambulatory Visit: Payer: Self-pay

## 2018-07-10 VITALS — BP 118/58 | Ht 70.5 in | Wt 215.0 lb

## 2018-07-10 DIAGNOSIS — M79644 Pain in right finger(s): Secondary | ICD-10-CM | POA: Diagnosis not present

## 2018-07-10 DIAGNOSIS — M25512 Pain in left shoulder: Secondary | ICD-10-CM

## 2018-07-10 DIAGNOSIS — M25519 Pain in unspecified shoulder: Secondary | ICD-10-CM | POA: Diagnosis not present

## 2018-07-10 DIAGNOSIS — Z7982 Long term (current) use of aspirin: Secondary | ICD-10-CM | POA: Diagnosis not present

## 2018-07-10 DIAGNOSIS — I10 Essential (primary) hypertension: Secondary | ICD-10-CM | POA: Diagnosis not present

## 2018-07-10 DIAGNOSIS — Z7901 Long term (current) use of anticoagulants: Secondary | ICD-10-CM | POA: Diagnosis not present

## 2018-07-10 DIAGNOSIS — Z79899 Other long term (current) drug therapy: Secondary | ICD-10-CM | POA: Diagnosis not present

## 2018-07-10 DIAGNOSIS — E119 Type 2 diabetes mellitus without complications: Secondary | ICD-10-CM | POA: Diagnosis not present

## 2018-07-10 DIAGNOSIS — Z951 Presence of aortocoronary bypass graft: Secondary | ICD-10-CM | POA: Diagnosis not present

## 2018-07-10 DIAGNOSIS — I2510 Atherosclerotic heart disease of native coronary artery without angina pectoris: Secondary | ICD-10-CM | POA: Diagnosis not present

## 2018-07-10 DIAGNOSIS — Z952 Presence of prosthetic heart valve: Secondary | ICD-10-CM | POA: Diagnosis not present

## 2018-07-10 DIAGNOSIS — M19012 Primary osteoarthritis, left shoulder: Secondary | ICD-10-CM | POA: Diagnosis not present

## 2018-07-10 DIAGNOSIS — Z8546 Personal history of malignant neoplasm of prostate: Secondary | ICD-10-CM | POA: Diagnosis not present

## 2018-07-10 MED ORDER — OXYCODONE-ACETAMINOPHEN 5-325 MG PO TABS: 1.0000 | ORAL_TABLET | Freq: Four times a day (QID) | ORAL | 0 refills | Status: DC | PRN

## 2018-07-10 MED ORDER — OXYCODONE-ACETAMINOPHEN 5-325 MG PO TABS
1.0000 | ORAL_TABLET | Freq: Once | ORAL | Status: AC
  Administered 2018-07-10: 1 via ORAL
  Filled 2018-07-10: qty 1

## 2018-07-10 NOTE — ED Provider Notes (Signed)
[facility] EMERGENCY DEPARTMENT Provider Note   CSN: [id number] Arrival date & time: 07/09/18  2347    History   Chief Complaint Chief Complaint  Patient presents with   Shoulder Injury    HPI [name] is a 73 y.o. male.     HPI  73 year old male with a history of aortic stenosis, coronary artery disease, hypertension, hyperlipidemia, arthritis who presents with left shoulder pain.  Patient reports that he was lifting a heavy landscape log on Sunday morning when he had acute onset of pain in his left shoulder.  Since that time he has had pain with range of motion and limited range of motion.  He has taken Tylenol with some relief.  However, tonight he was unable to get relief and was unable to sleep.  Patient reports his pain is currently 7 out of 10.  He denies any numbness or tingling in the hand.  Denies any other injury.  Denies chest pain, shortness of breath, fevers.  Past Medical History:  Diagnosis Date   Allergy    Aortic stenosis    CAD (coronary artery disease)    cabg   Cancer ([place]) 2011   prostate   Erectile dysfunction    Heart murmur    Hemochromatosis 02/14/2011   Hemorrhoids    History of gout    History of hepatitis 1974   Hyperlipidemia    Hypertension    Hypertr obst cardiomyop    Obesity    moderate   Sensorineural hearing loss    Type II or unspecified type diabetes mellitus without mention of complication, not stated as uncontrolled     Patient Active Problem List   Diagnosis Date Noted   Long term (current) use of anticoagulants 05/22/2018   Hereditary hemochromatosis ([place]) 03/30/2016   Gout 04/09/2015   Ventral hernia 03/03/2014   History of prostate cancer 11/28/2013   OSA (obstructive sleep apnea) 07/12/2012   DM (diabetes mellitus), type 2, uncontrolled with complications ([place]) 18/84/1660   Hemochromatosis 02/14/2011   Actinic keratosis 02/14/2011   Hypertriglyceridemia 06/13/2010    HEMORRHOIDS, EXTERNAL 03/21/2010   AORTIC VALVE REPLACEMENT, HX OF 10/13/2009   ADENOCARCINOMA, PROSTATE 06/17/2009   TOBACCO USER 06/16/2009   HEARING LOSS, SENSORINEURAL, BILATERAL 06/16/2009   Essential hypertension 06/16/2009   CORONARY ATHEROSCLEROSIS NATIVE CORONARY ARTERY 06/15/2009   ERECTILE DYSFUNCTION, ORGANIC 05/18/2009   Hypertrophic obstructive cardiomyopathy ([place]) 05/06/2008   Hyperlipidemia 04/29/2008   OBESITY, MODERATE 04/29/2008   CAD, ARTERY BYPASS GRAFT 04/29/2008    Past Surgical History:  Procedure Laterality Date   AORTIC VALVE REPLACEMENT     [facility]   [place]   CORONARY ARTERY BYPASS GRAFT  10/2003   HERNIA REPAIR  1999   right, inguinal   HERNIA REPAIR  2002   left, inguinal   HIP SURGERY  2006   right hip   PILONIDAL CYST EXCISION  1964   prostate seed implant  3/12   TONSILLECTOMY  childhood        Home Medications    Prior to Admission medications   Medication Sig Start Date End Date Taking? Authorizing Provider  amoxicillin (AMOXIL) 500 MG tablet Take all 4 tablets one hour prior to procedure 06/03/18   [name], NP  aspirin EC 81 MG tablet Take 1 tablet (81 mg total) by mouth daily. 06/20/17   Lelon Perla, MD  atorvastatin (LIPITOR) 20 MG tablet Take 1 tablet (20 mg total)  by mouth daily. 07/06/17   Lelon Perla, MD  Blood Glucose Monitoring Suppl (ONETOUCH VERIO FLEX SYSTEM) w/Device KIT 1 each by Does not apply route 2 (two) times daily. 07/19/16   [name], NP  colchicine 0.6 MG tablet Take 2 tabs by mouth now and then 1 tab in 1 hour for gout. 02/18/18   [name], NP  fenofibrate (TRICOR) 145 MG tablet Take 1 tablet (145 mg total) by mouth daily. 05/23/18   [name], NP  furosemide (LASIX) 20 MG tablet Take 1 tablet (20 mg total) by mouth daily as needed. For swelling 05/13/18   [name], NP  imiquimod [place]) 5 %  cream Apply topically 3 (three) times a week.    [provider]  Insulin Glargine (BASAGLAR KWIKPEN) 100 UNIT/ML SOPN INJECT 52 UNITS INTO THE SKIN AT BEDTIME. 03/13/18   [name], NP  metoprolol succinate (TOPROL-XL) 25 MG 24 hr tablet Take 1 tablet (25 mg total) by mouth daily. 07/01/18   [name], NP  NOVOFINE PLUS 32G X 4 MM MISC USE FOR INJECTIONS INTO THE SKIN EVERY EVENING 06/12/18   [name], NP  olmesartan-hydrochlorothiazide (BENICAR HCT) 20-12.5 MG tablet TAKE 1/2 TABLET BY MOUTH EVERY DAY 06/14/18   [name], NP  omega-3 acid ethyl esters (LOVAZA) 1 g capsule Take 1 capsule (1 g total) by mouth 2 (two) times daily. 06/06/18   Lelon Perla, MD  [facility] DELICA LANCETS FINE MISC Use to check blood sugar twice a day.  Dx  E11.8 07/19/16   [name], NP  ONETOUCH VERIO test strip USE AS INSTRUCTED TO CHECK BLOOD SUGAR TWICE A DAY. DX E11.8 01/05/18   [name], NP  OVER THE COUNTER MEDICATION OLIVE OIL. Take 1 tablespoon (1440m) every afternoon    [provider]  oxyCODONE-acetaminophen (PERCOCET/ROXICET) 5-325 MG tablet Take 1 tablet by mouth every 6 (six) hours as needed for severe pain. 07/10/18   Eriverto Byrnes, CBarbette Hair MD  warfarin (COUMADIN) 7.5 MG tablet TAKE 1/2 TO 1 TABLET DAILY AS DIRECTED BY COUMADIN CLINIC 02/13/18   CLelon Perla MD    Family History Family History  Problem Relation Age of Onset   Heart disease Mother    Stroke Mother    Heart disease Father    Asperger's syndrome Son    Hyperlipidemia Son    Coronary artery disease Brother    Cancer Neg Hx        negative for colon cancer    Social History Social History   Tobacco Use   Smoking status: Former Smoker    Types: Cigars   Smokeless tobacco: Never Used   Tobacco comment: Never used Tobacco  Substance Use Topics   Alcohol use: Yes    Alcohol/week: 0.0 standard drinks    Comment: 4 glasses wine a month    Drug use: No     Allergies   Patient has no known allergies.   Review of Systems Review of Systems  Constitutional: Negative for fever.  Respiratory: Negative for shortness of breath.   Cardiovascular: Negative for chest pain.  Musculoskeletal:       Left shoulder pain  Neurological: Negative for weakness and numbness.  All other systems reviewed and are negative.    Physical Exam Updated Vital Signs BP (!) 161/89 (BP Location: Right Arm)    Pulse 83    Temp 98.1 F (36.7 C) (Oral)    Resp 18    Ht 1.791 m (5' 10.5")  Wt 97.5 kg    SpO2 98%    BMI 30.41 kg/m   Physical Exam Vitals signs and nursing note reviewed.  Constitutional:      General: He is not in acute distress.    Appearance: He is well-developed. He is not ill-appearing.  HENT:     Head: Normocephalic and atraumatic.     Mouth/Throat:     Mouth: Mucous membranes are moist.  Neck:     Musculoskeletal: Neck supple.  Cardiovascular:     Rate and Rhythm: Normal rate and regular rhythm.  Pulmonary:     Effort: Pulmonary effort is normal. No respiratory distress.  Musculoskeletal:        General: Tenderness present.     Comments: Tenderness to palpation over the superior deltoid, no obvious deformities of the clavicle or humeral head, limited range of motion with abduction and flexion of the shoulder secondary to pain, 2+ radial pulse, neurovascular intact distally  Skin:    General: Skin is warm and dry.  Neurological:     Mental Status: He is alert and oriented to person, place, and time.  Psychiatric:        Mood and Affect: Mood normal.      ED Treatments / Results  Labs (all labs ordered are listed, but only abnormal results are displayed) Labs Reviewed - No data to display  EKG None  Radiology Dg Shoulder Left  Result Date: 07/10/2018 CLINICAL DATA:  Shoulder pain EXAM: LEFT SHOULDER - 2+ VIEW COMPARISON:  None FINDINGS: There is no acute displaced fracture or dislocation. There is  moderate to severe osteoarthritis of the glenohumeral and acromioclavicular joints. There are few inferiorly oriented osteophytes arising from the [facility] joint. IMPRESSION: 1. No acute osseous abnormality. 2. Moderate to severe osteoarthritis of the left shoulder. Electronically Signed   By: Constance Holster M.D.   On: 07/10/2018 00:51    Procedures Procedures (including critical care time)  Medications Ordered in ED Medications  oxyCODONE-acetaminophen (PERCOCET/ROXICET) 5-325 MG per tablet 1 tablet (1 tablet Oral Given 07/10/18 0026)     Initial Impression / Assessment and Plan / ED Course  I have reviewed the triage vital signs and the nursing notes.  Pertinent labs & imaging results that were available during my care of the patient were reviewed by me and considered in my medical decision making (see chart for details).        Presents with left shoulder pain after heavy lifting.  Overall nontoxic-appearing.  He does have limited range of motion.  No obvious deformities.  X-rays show extensive osteoarthritis.  Suspect this versus rotator cuff injury.  He has follow-up with Dr. had now later today for his knees.  Would recommend follow-up for his shoulder as well.  Early range of motion exercises.  He was given a short course of Percocet.  After history, exam, and medical workup I feel the patient has been appropriately medically screened and is safe for discharge home. Pertinent diagnoses were discussed with the patient. Patient was given return precautions.   Final Clinical Impressions(s) / ED Diagnoses   Final diagnoses:  Arthritis of shoulder    ED Discharge Orders         Ordered    oxyCODONE-acetaminophen (PERCOCET/ROXICET) 5-325 MG tablet  Every 6 hours PRN     07/10/18 0059           Merryl Hacker, MD 07/10/18 0106

## 2018-07-10 NOTE — Patient Instructions (Addendum)
You strained your rotator cuff but your ultrasound is reassuring. Icing 15 minutes at a time 3-4 times a day. Topical biofreeze or aspercreme up to 4 times a day for pain and inflammation. Consider salon pas patches with this. Tylenol 500mg  1-2 tabs three times a day as needed for pain. Sling if needed for comfort but I don't want you to rely on this. Follow up with me in 2 weeks for reevaluation.  Wear the wrap on your finger as you have been. If this gets bad enough we can inject the trigger finger.

## 2018-07-10 NOTE — ED Notes (Signed)
Patient transported to X-ray 

## 2018-07-12 ENCOUNTER — Encounter: Payer: Self-pay | Admitting: Family

## 2018-07-12 DIAGNOSIS — E1159 Type 2 diabetes mellitus with other circulatory complications: Secondary | ICD-10-CM

## 2018-07-12 DIAGNOSIS — I152 Hypertension secondary to endocrine disorders: Secondary | ICD-10-CM

## 2018-07-15 ENCOUNTER — Encounter: Payer: Self-pay | Admitting: Family Medicine

## 2018-07-15 MED ORDER — METOPROLOL SUCCINATE ER 25 MG PO TB24: 25.0000 mg | ORAL_TABLET | Freq: Every day | ORAL | 1 refills | Status: DC

## 2018-07-15 NOTE — Progress Notes (Signed)
PCP: [name], NP  Subjective:   HPI: Patient is a 73 y.o. male here for left shoulder pain, right ring finger pain.  Patient reports on 6/6 he was moving a 10 inch timber slightly and felt/heard a pop in left shoulder and a lot of pain, difficulty moving this arm since that time. Pain is sharp. No swelling or bruising. No prior injuries to this shoulder. Difficulty and pain with any motions including reaching. He also has catching, difficulty flexing his right ring finger - has wrapped this up around PIP joint which helps. No numbness.  Past Medical History:  Diagnosis Date  . Allergy   . Aortic stenosis   . CAD (coronary artery disease)    cabg  . Cancer [facility]) 2011   prostate  . Erectile dysfunction   . Heart murmur   . Hemochromatosis 02/14/2011  . Hemorrhoids   . History of gout   . History of hepatitis 1974  . Hyperlipidemia   . Hypertension   . Hypertr obst cardiomyop   . Obesity    moderate  . Sensorineural hearing loss   . Type II or unspecified type diabetes mellitus without mention of complication, not stated as uncontrolled     Current Outpatient Medications on File Prior to Visit  Medication Sig Dispense Refill  . amoxicillin (AMOXIL) 500 MG tablet Take all 4 tablets one hour prior to procedure 4 tablet 6  . aspirin EC 81 MG tablet Take 1 tablet (81 mg total) by mouth daily. 90 tablet 3  . atorvastatin (LIPITOR) 20 MG tablet Take 1 tablet (20 mg total) by mouth daily. 90 tablet 3  . Blood Glucose Monitoring Suppl (ONETOUCH VERIO FLEX SYSTEM) w/Device KIT 1 each by Does not apply route 2 (two) times daily. 1 kit 0  . colchicine 0.6 MG tablet Take 2 tabs by mouth now and then 1 tab in 1 hour for gout. 3 tablet 0  . fenofibrate (TRICOR) 145 MG tablet Take 1 tablet (145 mg total) by mouth daily. 90 tablet 1  . furosemide (LASIX) 20 MG tablet Take 1 tablet (20 mg total) by mouth daily as needed. For swelling 90 tablet 1  . imiquimod (ALDARA) 5 % cream  Apply topically 3 (three) times a week.    . Insulin Glargine (BASAGLAR KWIKPEN) 100 UNIT/ML SOPN INJECT 52 UNITS INTO THE SKIN AT BEDTIME. 45 mL 1  . NOVOFINE PLUS 32G X 4 MM MISC USE FOR INJECTIONS INTO THE SKIN EVERY EVENING 100 each 3  . olmesartan-hydrochlorothiazide (BENICAR HCT) 20-12.5 MG tablet TAKE 1/2 TABLET BY MOUTH EVERY DAY 45 tablet 1  . omega-3 acid ethyl esters (LOVAZA) 1 g capsule Take 1 capsule (1 g total) by mouth 2 (two) times daily. 180 capsule 1  . ONETOUCH DELICA LANCETS FINE MISC Use to check blood sugar twice a day.  Dx  E11.8 200 each 1  . ONETOUCH VERIO test strip USE AS INSTRUCTED TO CHECK BLOOD SUGAR TWICE A DAY. DX E11.8 100 each 4  . OVER THE COUNTER MEDICATION OLIVE OIL. Take 1 tablespoon (1448m) every afternoon    . oxyCODONE-acetaminophen (PERCOCET/ROXICET) 5-325 MG tablet Take 1 tablet by mouth every 6 (six) hours as needed for severe pain. 10 tablet 0  . warfarin (COUMADIN) 7.5 MG tablet TAKE 1/2 TO 1 TABLET DAILY AS DIRECTED BY COUMADIN CLINIC 85 tablet 1   No current facility-administered medications on file prior to visit.     Past Surgical History:  Procedure Laterality Date  .  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 APPENDECTOMY  1990  . CHOLECYSTECTOMY  1990  . CORONARY ARTERY BYPASS GRAFT  10/2003  . HERNIA REPAIR  1999   right, inguinal  . HERNIA REPAIR  2002   left, inguinal  . HIP SURGERY  2006   right hip  . PILONIDAL CYST EXCISION  1964  . prostate seed implant  3/12  . TONSILLECTOMY  childhood    No Known Allergies  Social History   Socioeconomic History  . Marital status: Married    Spouse name: Not on file  . Number of children: Not on file  . Years of education: Not on file  . Highest education level: Not on file  Occupational History  . Not on file  Social Needs  . Financial resource strain: Not on file  . Food insecurity    Worry: Not on file    Inability: Not on file  . Transportation needs    Medical:  Not on file    Non-medical: Not on file  Tobacco Use  . Smoking status: Former Smoker    Types: Cigars  . Smokeless tobacco: Never Used  . Tobacco comment: Never used Tobacco  Substance and Sexual Activity  . Alcohol use: Yes    Alcohol/week: 0.0 standard drinks    Comment: 4 glasses wine a month  . Drug use: No  . Sexual activity: Never  Lifestyle  . Physical activity    Days per week: Not on file    Minutes per session: Not on file  . Stress: Not on file  Relationships  . Social Herbalist on phone: Not on file    Gets together: Not on file    Attends religious service: Not on file    Active member of club or organization: Not on file    Attends meetings of clubs or organizations: Not on file    Relationship status: Not on file  . Intimate partner violence    Fear of current or ex partner: Not on file    Emotionally abused: Not on file    Physically abused: Not on file    Forced sexual activity: Not on file  Other Topics Concern  . Not on file  Social History Narrative  . Not on file    Family History  Problem Relation Age of Onset  . Heart disease Mother   . Stroke Mother   . Heart disease Father   . Asperger's syndrome Son   . Hyperlipidemia Son   . Coronary artery disease Brother   . Cancer Neg Hx        negative for colon cancer    BP (!) 118/58   Ht 5' 10.5" (1.791 m)   Wt 215 lb (97.5 kg)   BMI 30.41 kg/m   Review of Systems: See HPI above.     Objective:  Physical Exam:  Gen: NAD, comfortable in exam room  Left shoulder: No swelling, ecchymoses.  No gross deformity. No AC, clavicle, biceps tendon tenderness. ROM limited to 30 degrees flexion and abduction, 20 ER.  Full IR. Pain attempting Wynonia Musty. Negative Yergasons. Strength 5/5 with resisted internal/external rotation.  Unable to position for empty can. NV intact distally.  Right shoulder: No swelling, ecchymoses.  No gross deformity. No TTP. FROM. Strength 5/5  with empty can and resisted internal/external rotation. NV intact distally.   Right 4th digit: No deformity, swelling, bruising. FROM with 5/5 strength  but pain on flexion. Mild TTP at A1 pulley with small nodule. NVI distally.  MSK u/s left shoulder: Biceps tendon intact but with target sign.  Subscapularis, infraspinatus, supraspinatus without obvious tears.  AC joint with mild increase in fluid.    Assessment & Plan:  1. Left shoulder pain - motion very limited though ultrasound reassuring.  Consistent with rotator cuff strain.  Icing, topical medications, tylenol, salon pas patches.  Sling if needed.  F/u in 2 weeks for reevaluation.  2. Right trigger finger - right 4th digit.  Not bothering him enough for injection - will continue wrapping at PIP joint.

## 2018-07-22 ENCOUNTER — Ambulatory Visit: Payer: Medicare HMO | Admitting: Family Medicine

## 2018-07-22 ENCOUNTER — Other Ambulatory Visit: Payer: Self-pay

## 2018-07-22 ENCOUNTER — Encounter: Payer: Self-pay | Admitting: Family Medicine

## 2018-07-22 VITALS — BP 124/64 | HR 57 | Ht 70.0 in | Wt 214.0 lb

## 2018-07-22 DIAGNOSIS — M25512 Pain in left shoulder: Secondary | ICD-10-CM

## 2018-07-22 NOTE — Patient Instructions (Signed)
You're doing great! Icing 15 minutes at a time 3-4 times a day. Topical biofreeze or aspercreme up to 4 times a day for pain and inflammation if needed. Tylenol 500mg  1-2 tabs three times a day as needed for pain. I would recommend waiting about 4 weeks before doing overhead motions, reaching, moving the landscape timbers. Follow up with me as needed.

## 2018-07-23 ENCOUNTER — Encounter: Payer: Self-pay | Admitting: Family Medicine

## 2018-07-23 NOTE — Progress Notes (Signed)
PCP: [name], NP  Subjective:   HPI: Patient is a 73 y.o. male here for left shoulder pain.  6/10: Patient reports on 6/6 he was moving a 10 inch timber slightly and felt/heard a pop in left shoulder and a lot of pain, difficulty moving this arm since that time. Pain is sharp. No swelling or bruising. No prior injuries to this shoulder. Difficulty and pain with any motions including reaching. He also has catching, difficulty flexing his right ring finger - has wrapped this up around PIP joint which helps. No numbness.  6/22: Patient reports he's doing much better. Pain level down to 1/10. Able to lift overhead better than last visit. No night pain. Not taking anything for pain for shoulder now. No skin changes.  Past Medical History:  Diagnosis Date  . Allergy   . Aortic stenosis   . CAD (coronary artery disease)    cabg  . Cancer [facility]) 2011   prostate  . Erectile dysfunction   . Heart murmur   . Hemochromatosis 02/14/2011  . Hemorrhoids   . History of gout   . History of hepatitis 1974  . Hyperlipidemia   . Hypertension   . Hypertr obst cardiomyop   . Obesity    moderate  . Sensorineural hearing loss   . Type II or unspecified type diabetes mellitus without mention of complication, not stated as uncontrolled     Current Outpatient Medications on File Prior to Visit  Medication Sig Dispense Refill  . amoxicillin (AMOXIL) 500 MG tablet Take all 4 tablets one hour prior to procedure 4 tablet 6  . aspirin EC 81 MG tablet Take 1 tablet (81 mg total) by mouth daily. 90 tablet 3  . atorvastatin (LIPITOR) 20 MG tablet Take 1 tablet (20 mg total) by mouth daily. 90 tablet 3  . Blood Glucose Monitoring Suppl (ONETOUCH VERIO FLEX SYSTEM) w/Device KIT 1 each by Does not apply route 2 (two) times daily. 1 kit 0  . colchicine 0.6 MG tablet Take 2 tabs by mouth now and then 1 tab in 1 hour for gout. 3 tablet 0  . fenofibrate (TRICOR) 145 MG tablet Take 1 tablet (145  mg total) by mouth daily. 90 tablet 1  . furosemide (LASIX) 20 MG tablet Take 1 tablet (20 mg total) by mouth daily as needed. For swelling 90 tablet 1  . imiquimod (ALDARA) 5 % cream Apply topically 3 (three) times a week.    . Insulin Glargine (BASAGLAR KWIKPEN) 100 UNIT/ML SOPN INJECT 52 UNITS INTO THE SKIN AT BEDTIME. 45 mL 1  . metoprolol succinate (TOPROL-XL) 25 MG 24 hr tablet Take 1 tablet (25 mg total) by mouth daily. 90 tablet 1  . NOVOFINE PLUS 32G X 4 MM MISC USE FOR INJECTIONS INTO THE SKIN EVERY EVENING 100 each 3  . olmesartan-hydrochlorothiazide (BENICAR HCT) 20-12.5 MG tablet TAKE 1/2 TABLET BY MOUTH EVERY DAY 45 tablet 1  . omega-3 acid ethyl esters (LOVAZA) 1 g capsule Take 1 capsule (1 g total) by mouth 2 (two) times daily. 180 capsule 1  . ONETOUCH DELICA LANCETS FINE MISC Use to check blood sugar twice a day.  Dx  E11.8 200 each 1  . ONETOUCH VERIO test strip USE AS INSTRUCTED TO CHECK BLOOD SUGAR TWICE A DAY. DX E11.8 100 each 4  . OVER THE COUNTER MEDICATION OLIVE OIL. Take 1 tablespoon (1492m) every afternoon    . oxyCODONE-acetaminophen (PERCOCET/ROXICET) 5-325 MG tablet Take 1 tablet by mouth every 6 (  six) hours as needed for severe pain. 10 tablet 0  . warfarin (COUMADIN) 7.5 MG tablet TAKE 1/2 TO 1 TABLET DAILY AS DIRECTED BY COUMADIN CLINIC 85 tablet 1   No current facility-administered medications on file prior to visit.     Past Surgical History:  Procedure Laterality Date  . 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 APPENDECTOMY  1990  . CHOLECYSTECTOMY  1990  . CORONARY ARTERY BYPASS GRAFT  10/2003  . HERNIA REPAIR  1999   right, inguinal  . HERNIA REPAIR  2002   left, inguinal  . HIP SURGERY  2006   right hip  . PILONIDAL CYST EXCISION  1964  . prostate seed implant  3/12  . TONSILLECTOMY  childhood    No Known Allergies  Social History   Socioeconomic History  . Marital status: Married    Spouse name: Not on file  . Number of  children: Not on file  . Years of education: Not on file  . Highest education level: Not on file  Occupational History  . Not on file  Social Needs  . Financial resource strain: Not on file  . Food insecurity    Worry: Not on file    Inability: Not on file  . Transportation needs    Medical: Not on file    Non-medical: Not on file  Tobacco Use  . Smoking status: Former Smoker    Types: Cigars  . Smokeless tobacco: Never Used  . Tobacco comment: Never used Tobacco  Substance and Sexual Activity  . Alcohol use: Yes    Alcohol/week: 0.0 standard drinks    Comment: 4 glasses wine a month  . Drug use: No  . Sexual activity: Never  Lifestyle  . Physical activity    Days per week: Not on file    Minutes per session: Not on file  . Stress: Not on file  Relationships  . Social Herbalist on phone: Not on file    Gets together: Not on file    Attends religious service: Not on file    Active member of club or organization: Not on file    Attends meetings of clubs or organizations: Not on file    Relationship status: Not on file  . Intimate partner violence    Fear of current or ex partner: Not on file    Emotionally abused: Not on file    Physically abused: Not on file    Forced sexual activity: Not on file  Other Topics Concern  . Not on file  Social History Narrative  . Not on file    Family History  Problem Relation Age of Onset  . Heart disease Mother   . Stroke Mother   . Heart disease Father   . Asperger's syndrome Son   . Hyperlipidemia Son   . Coronary artery disease Brother   . Cancer Neg Hx        negative for colon cancer    BP 124/64   Pulse (!) 57   Ht 5' 10"  (1.778 m)   Wt 214 lb (97.1 kg)   BMI 30.71 kg/m   Review of Systems: See HPI above.     Objective:  Physical Exam:  Gen: NAD, comfortable in exam room  Left shoulder: No swelling, ecchymoses.  No gross deformity. No TTP. ROM 60 ER, full IR, abduction and flexion to 120  degrees. Negative Hawkins, Neers. Negative Yergasons. Strength 5/5  with empty can and resisted internal/external rotation.  Mild pain empty can. NV intact distally.  Assessment & Plan:  1. Left shoulder pain - much improved compared to last visit.  2/2 rotator cuff strain.  Icing, topical medications and tylenol if needed.  Relative rest for next 4 weeks.  F/u prn.

## 2018-07-24 ENCOUNTER — Telehealth: Payer: Self-pay

## 2018-07-24 ENCOUNTER — Ambulatory Visit: Payer: Medicare HMO | Admitting: Family Medicine

## 2018-07-24 NOTE — Telephone Encounter (Signed)

## 2018-07-30 ENCOUNTER — Encounter: Payer: Self-pay | Admitting: Family

## 2018-07-31 ENCOUNTER — Encounter: Payer: Self-pay | Admitting: Family

## 2018-07-31 ENCOUNTER — Ambulatory Visit (INDEPENDENT_AMBULATORY_CARE_PROVIDER_SITE_OTHER): Payer: Medicare HMO | Admitting: Pharmacist

## 2018-07-31 ENCOUNTER — Other Ambulatory Visit: Payer: Self-pay

## 2018-07-31 DIAGNOSIS — Z7901 Long term (current) use of anticoagulants: Secondary | ICD-10-CM | POA: Diagnosis not present

## 2018-07-31 DIAGNOSIS — I359 Nonrheumatic aortic valve disorder, unspecified: Secondary | ICD-10-CM | POA: Diagnosis not present

## 2018-07-31 DIAGNOSIS — Z952 Presence of prosthetic heart valve: Secondary | ICD-10-CM | POA: Diagnosis not present

## 2018-07-31 LAB — POCT INR: INR: 4.9 — AB (ref 2.0–3.0)

## 2018-08-01 ENCOUNTER — Encounter: Payer: Self-pay | Admitting: Family

## 2018-08-01 DIAGNOSIS — R69 Illness, unspecified: Secondary | ICD-10-CM | POA: Diagnosis not present

## 2018-08-01 MED ORDER — ONETOUCH VERIO VI STRP: ORAL_STRIP | 4 refills | Status: DC

## 2018-08-05 ENCOUNTER — Other Ambulatory Visit: Payer: Self-pay | Admitting: Cardiology

## 2018-08-05 ENCOUNTER — Other Ambulatory Visit: Payer: Self-pay

## 2018-08-05 MED ORDER — BLOOD GLUCOSE MONITORING SUPPL SUPPLIES MISC: 1 refills | Status: AC

## 2018-08-05 NOTE — Telephone Encounter (Signed)
Please send supplies to his pharmacy.

## 2018-08-06 DIAGNOSIS — R69 Illness, unspecified: Secondary | ICD-10-CM | POA: Diagnosis not present

## 2018-08-15 ENCOUNTER — Telehealth: Payer: Self-pay

## 2018-08-15 NOTE — Telephone Encounter (Signed)

## 2018-08-19 ENCOUNTER — Ambulatory Visit (INDEPENDENT_AMBULATORY_CARE_PROVIDER_SITE_OTHER): Payer: Medicare HMO | Admitting: *Deleted

## 2018-08-19 ENCOUNTER — Other Ambulatory Visit: Payer: Self-pay

## 2018-08-19 DIAGNOSIS — Z952 Presence of prosthetic heart valve: Secondary | ICD-10-CM

## 2018-08-19 DIAGNOSIS — I359 Nonrheumatic aortic valve disorder, unspecified: Secondary | ICD-10-CM | POA: Diagnosis not present

## 2018-08-19 DIAGNOSIS — Z7901 Long term (current) use of anticoagulants: Secondary | ICD-10-CM | POA: Diagnosis not present

## 2018-08-19 LAB — POCT INR: INR: 2.9 (ref 2.0–3.0)

## 2018-08-19 NOTE — Patient Instructions (Signed)
Description    Continue 1 tablet daily except 1/2 tablet on Tuesdays, Thursdays, and Saturdays.  Recheck INR in 3 weeks. [phone] [facility]

## 2018-08-23 ENCOUNTER — Other Ambulatory Visit: Payer: Self-pay | Admitting: Cardiology

## 2018-08-29 ENCOUNTER — Encounter: Payer: Self-pay | Admitting: Family

## 2018-08-29 DIAGNOSIS — E785 Hyperlipidemia, unspecified: Secondary | ICD-10-CM

## 2018-08-29 DIAGNOSIS — E119 Type 2 diabetes mellitus without complications: Secondary | ICD-10-CM

## 2018-08-30 ENCOUNTER — Encounter: Payer: Self-pay | Admitting: Family

## 2018-08-31 ENCOUNTER — Other Ambulatory Visit: Payer: Self-pay | Admitting: Cardiology

## 2018-09-02 ENCOUNTER — Ambulatory Visit: Payer: Medicare HMO | Admitting: Family

## 2018-09-03 ENCOUNTER — Ambulatory Visit: Payer: Medicare HMO | Admitting: Family

## 2018-09-04 ENCOUNTER — Encounter: Payer: Self-pay | Admitting: Family

## 2018-09-06 ENCOUNTER — Encounter: Payer: Self-pay | Admitting: Family

## 2018-09-06 ENCOUNTER — Ambulatory Visit (INDEPENDENT_AMBULATORY_CARE_PROVIDER_SITE_OTHER): Payer: Medicare HMO | Admitting: Family

## 2018-09-06 ENCOUNTER — Other Ambulatory Visit: Payer: Self-pay

## 2018-09-06 VITALS — BP 109/59 | HR 58 | Temp 98.1°F | Resp 16 | Ht 70.0 in | Wt 217.2 lb

## 2018-09-06 DIAGNOSIS — E785 Hyperlipidemia, unspecified: Secondary | ICD-10-CM

## 2018-09-06 DIAGNOSIS — E118 Type 2 diabetes mellitus with unspecified complications: Secondary | ICD-10-CM | POA: Diagnosis not present

## 2018-09-06 DIAGNOSIS — I10 Essential (primary) hypertension: Secondary | ICD-10-CM | POA: Diagnosis not present

## 2018-09-06 DIAGNOSIS — E1165 Type 2 diabetes mellitus with hyperglycemia: Secondary | ICD-10-CM

## 2018-09-06 DIAGNOSIS — IMO0002 Reserved for concepts with insufficient information to code with codable children: Secondary | ICD-10-CM

## 2018-09-06 LAB — COMPREHENSIVE METABOLIC PANEL
ALT: 21 U/L (ref 0–53)
AST: 25 U/L (ref 0–37)
Albumin: 4.5 g/dL (ref 3.5–5.2)
Alkaline Phosphatase: 39 U/L (ref 39–117)
BUN: 44 mg/dL — ABNORMAL HIGH (ref 6–23)
CO2: 25 mEq/L (ref 19–32)
Calcium: 9.7 mg/dL (ref 8.4–10.5)
Chloride: 103 mEq/L (ref 96–112)
Creatinine, Ser: 1.71 mg/dL — ABNORMAL HIGH (ref 0.40–1.50)
GFR: 39.39 mL/min — ABNORMAL LOW (ref >60.00)
Glucose, Bld: 126 mg/dL — ABNORMAL HIGH (ref 70–99)
Potassium: 4.4 mEq/L (ref 3.5–5.1)
Sodium: 138 mEq/L (ref 135–145)
Total Bilirubin: 0.4 mg/dL (ref 0.2–1.2)
Total Protein: 7.5 g/dL (ref 6.0–8.3)

## 2018-09-06 LAB — LIPID PANEL
Cholesterol: 157 mg/dL (ref 0–200)
HDL: 25.2 mg/dL — ABNORMAL LOW (ref >39.00)
NonHDL: 132.15
Total CHOL/HDL Ratio: 6
Triglycerides: 331 mg/dL — ABNORMAL HIGH (ref 0.0–149.0)
VLDL: 66.2 mg/dL — ABNORMAL HIGH (ref 0.0–40.0)

## 2018-09-06 LAB — HEMOGLOBIN A1C: Hgb A1c MFr Bld: 8.3 % — ABNORMAL HIGH (ref 4.6–6.5)

## 2018-09-06 LAB — LDL CHOLESTEROL, DIRECT: Direct LDL: 83 mg/dL

## 2018-09-06 MED ORDER — OLMESARTAN MEDOXOMIL-HCTZ 20-12.5 MG PO TABS: 0.5000 | ORAL_TABLET | Freq: Every day | ORAL | 1 refills | Status: DC

## 2018-09-06 MED ORDER — BETAMETHASONE DIPROPIONATE 0.05 % EX CREA: TOPICAL_CREAM | Freq: Two times a day (BID) | CUTANEOUS | 1 refills | Status: DC | PRN

## 2018-09-06 NOTE — Patient Instructions (Signed)
Please complete lab work prior to leaving.  Keep up the good work with healthy diet, exercise.

## 2018-09-06 NOTE — Progress Notes (Signed)
Subjective:    Patient ID: [name], male    DOB: [birth year], 73 y.o.   MRN: [id number]  HPI  Patient is a 73 year old male who presents today for routine follow-up.  Diabetes type 2- has been working on a plant based diet.   Lab Results  Component Value Date   HGBA1C 8.2 (H) 03/12/2018   HGBA1C 7.2 (H) 12/04/2017   HGBA1C 7.5 (H) 08/29/2017   Lab Results  Component Value Date   MICROALBUR 8.7 (H) 09/09/2014   LDLCALC 54 11/25/2010   CREATININE 1.78 (H) 04/16/2018   Hypertension- current blood pressure medication includes Toprol-XL 25 mg.  BP Readings from Last 3 Encounters:  09/06/18 (!) 109/59  07/22/18 124/64  07/10/18 (!) 118/58   Hyperlipidemia-maintained on fenofibrate and Lipitor.   He reports that he did fall recently and fell forward with his left arm outstretched. Left arm/shoulder took all of his weight.  Had some soreness but notes that pain is improving.   Review of Systems    see HPI  Past Medical History:  Diagnosis Date  . Allergy   . Aortic stenosis   . CAD (coronary artery disease)    cabg  . Cancer [facility]) 2011   prostate  . Erectile dysfunction   . Heart murmur   . Hemochromatosis 02/14/2011  . Hemorrhoids   . History of gout   . History of hepatitis 1974  . Hyperlipidemia   . Hypertension   . Hypertr obst cardiomyop   . Obesity    moderate  . Sensorineural hearing loss   . Type II or unspecified type diabetes mellitus without mention of complication, not stated as uncontrolled      Social History   Socioeconomic History  . Marital status: Married    Spouse name: Not on file  . Number of children: Not on file  . Years of education: Not on file  . Highest education level: Not on file  Occupational History  . Not on file  Social Needs  . Financial resource strain: Not on file  . Food insecurity    Worry: Not on file    Inability: Not on file  . Transportation needs    Medical: Not on file    Non-medical: Not on file   Tobacco Use  . Smoking status: Former Smoker    Types: Cigars  . Smokeless tobacco: Never Used  . Tobacco comment: Never used Tobacco  Substance and Sexual Activity  . Alcohol use: Yes    Alcohol/week: 0.0 standard drinks    Comment: 4 glasses wine a month  . Drug use: No  . Sexual activity: Never  Lifestyle  . Physical activity    Days per week: Not on file    Minutes per session: Not on file  . Stress: Not on file  Relationships  . Social Herbalist on phone: Not on file    Gets together: Not on file    Attends religious service: Not on file    Active member of club or organization: Not on file    Attends meetings of clubs or organizations: Not on file    Relationship status: Not on file  . Intimate partner violence    Fear of current or ex partner: Not on file    Emotionally abused: Not on file    Physically abused: Not on file    Forced sexual activity: Not on file  Other Topics Concern  . Not on file  Social History Narrative  . Not on file    Past Surgical History:  Procedure Laterality Date  . 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 APPENDECTOMY  1990  . CHOLECYSTECTOMY  1990  . CORONARY ARTERY BYPASS GRAFT  10/2003  . HERNIA REPAIR  1999   right, inguinal  . HERNIA REPAIR  2002   left, inguinal  . HIP SURGERY  2006   right hip  . PILONIDAL CYST EXCISION  1964  . prostate seed implant  3/12  . TONSILLECTOMY  childhood    Family History  Problem Relation Age of Onset  . Heart disease Mother   . Stroke Mother   . Heart disease Father   . Asperger's syndrome Son   . Hyperlipidemia Son   . Coronary artery disease Brother   . Cancer Neg Hx        negative for colon cancer    No Known Allergies  Current Outpatient Medications on File Prior to Visit  Medication Sig Dispense Refill  . aspirin EC 81 MG tablet Take 1 tablet (81 mg total) by mouth daily. 90 tablet 3  . atorvastatin (LIPITOR) 20 MG tablet TAKE 1 TABLET (20 MG TOTAL)  BY MOUTH DAILY. OFFICE VISIT NEEDED 30 tablet 1  . Blood Glucose Monitoring Suppl Supplies MISC Use for monitoring glucose level 100 each 1  . colchicine 0.6 MG tablet Take 2 tabs by mouth now and then 1 tab in 1 hour for gout. 3 tablet 0  . fenofibrate (TRICOR) 145 MG tablet Take 1 tablet (145 mg total) by mouth daily. 90 tablet 1  . furosemide (LASIX) 20 MG tablet Take 1 tablet (20 mg total) by mouth daily as needed. For swelling 90 tablet 1  . glucose blood (ONETOUCH VERIO) test strip USE AS INSTRUCTED TO CHECK BLOOD SUGAR TWICE A DAY. DX E11.8 100 each 4  . imiquimod (ALDARA) 5 % cream Apply topically 3 (three) times a week.    . Insulin Glargine (BASAGLAR KWIKPEN) 100 UNIT/ML SOPN INJECT 52 UNITS INTO THE SKIN AT BEDTIME. 45 mL 1  . metoprolol succinate (TOPROL-XL) 25 MG 24 hr tablet Take 1 tablet (25 mg total) by mouth daily. 90 tablet 1  . NOVOFINE PLUS 32G X 4 MM MISC USE FOR INJECTIONS INTO THE SKIN EVERY EVENING 100 each 3  . olmesartan-hydrochlorothiazide (BENICAR HCT) 20-12.5 MG tablet TAKE 1/2 TABLET BY MOUTH EVERY DAY 45 tablet 1  . omega-3 acid ethyl esters (LOVAZA) 1 g capsule Take 1 capsule (1 g total) by mouth 2 (two) times daily. 180 capsule 1  . ONETOUCH DELICA LANCETS FINE MISC Use to check blood sugar twice a day.  Dx  E11.8 200 each 1  . OVER THE COUNTER MEDICATION OLIVE OIL. Take 1 tablespoon (1480mg ) every afternoon    . oxyCODONE-acetaminophen (PERCOCET/ROXICET) 5-325 MG tablet Take 1 tablet by mouth every 6 (six) hours as needed for severe pain. 10 tablet 0  . warfarin (COUMADIN) 7.5 MG tablet TAKE 1/2 TO 1 TABLET DAILY AS DIRECTED BY COUMADIN CLINIC 90 tablet 1   No current facility-administered medications on file prior to visit.     BP (!) 109/59   Pulse (!) 58   Temp 98.1 F (36.7 C) (Oral)   Resp 16   Ht 5\' 10"  (1.778 m)   Wt 217 lb 3.2 oz (98.5 kg)   SpO2 99%   BMI 31.16 kg/m    Objective:   Physical Exam Constitutional:  General: He is not in  acute distress.    Appearance: He is well-developed.  HENT:     Head: Normocephalic and atraumatic.  Cardiovascular:     Rate and Rhythm: Normal rate and regular rhythm.     Heart sounds: No murmur (+ click from prosthetic valve).  Pulmonary:     Effort: Pulmonary effort is normal. No respiratory distress.     Breath sounds: Normal breath sounds. No wheezing or rales.  Musculoskeletal:     Right lower leg: Edema (2+) present.     Left lower leg: Edema (2+) present.     Comments: Full ROM of left shoulder, no tenderness or swelling  Skin:    General: Skin is warm and dry.  Neurological:     Mental Status: He is alert and oriented to person, place, and time.  Psychiatric:        Behavior: Behavior normal.        Thought Content: Thought content normal.           Assessment & Plan:  DM2- home readings stable. Obtain current a1c, continue current meds.  HTN- bp stable on current meds.  Hyperlipidemia- he is hoping that his numbers will look better due to his recent dietary improvement and regular walking. Obtain follow up lipid panel.

## 2018-09-09 ENCOUNTER — Telehealth: Payer: Self-pay | Admitting: Family

## 2018-09-09 ENCOUNTER — Other Ambulatory Visit: Payer: Self-pay | Admitting: Family

## 2018-09-09 ENCOUNTER — Encounter: Payer: Self-pay | Admitting: Family

## 2018-09-09 DIAGNOSIS — E1165 Type 2 diabetes mellitus with hyperglycemia: Secondary | ICD-10-CM

## 2018-09-09 DIAGNOSIS — IMO0002 Reserved for concepts with insufficient information to code with codable children: Secondary | ICD-10-CM

## 2018-09-09 MED ORDER — BASAGLAR KWIKPEN 100 UNIT/ML ~~LOC~~ SOPN: 57.0000 [IU] | PEN_INJECTOR | Freq: Every day | SUBCUTANEOUS | 1 refills | Status: DC

## 2018-09-09 NOTE — Telephone Encounter (Signed)
See order(s).

## 2018-09-11 ENCOUNTER — Other Ambulatory Visit: Payer: Self-pay

## 2018-09-11 ENCOUNTER — Ambulatory Visit (INDEPENDENT_AMBULATORY_CARE_PROVIDER_SITE_OTHER): Payer: Medicare HMO | Admitting: Pharmacist Clinician (PhC)/ Clinical Pharmacy Specialist

## 2018-09-11 DIAGNOSIS — Z7901 Long term (current) use of anticoagulants: Secondary | ICD-10-CM | POA: Diagnosis not present

## 2018-09-11 DIAGNOSIS — Z952 Presence of prosthetic heart valve: Secondary | ICD-10-CM

## 2018-09-11 DIAGNOSIS — I359 Nonrheumatic aortic valve disorder, unspecified: Secondary | ICD-10-CM | POA: Diagnosis not present

## 2018-09-11 LAB — POCT INR: INR: 3.8 — AB (ref 2.0–3.0)

## 2018-09-12 MED ORDER — BETAMETHASONE VALERATE 0.1 % EX OINT: 1.0000 "application " | TOPICAL_OINTMENT | Freq: Two times a day (BID) | CUTANEOUS | 0 refills | Status: DC

## 2018-09-12 MED ORDER — OLMESARTAN MEDOXOMIL-HCTZ 20-12.5 MG PO TABS: 0.5000 | ORAL_TABLET | Freq: Every day | ORAL | 1 refills | Status: DC

## 2018-09-16 ENCOUNTER — Other Ambulatory Visit: Payer: Self-pay | Admitting: Family

## 2018-09-25 ENCOUNTER — Other Ambulatory Visit: Payer: Self-pay

## 2018-09-25 ENCOUNTER — Encounter: Payer: Medicare HMO | Attending: Family | Admitting: Dietician

## 2018-09-25 ENCOUNTER — Encounter: Payer: Self-pay | Admitting: Dietician

## 2018-09-25 DIAGNOSIS — E1165 Type 2 diabetes mellitus with hyperglycemia: Secondary | ICD-10-CM | POA: Diagnosis not present

## 2018-09-25 DIAGNOSIS — E118 Type 2 diabetes mellitus with unspecified complications: Secondary | ICD-10-CM | POA: Diagnosis not present

## 2018-09-25 DIAGNOSIS — IMO0002 Reserved for concepts with insufficient information to code with codable children: Secondary | ICD-10-CM

## 2018-09-25 NOTE — Patient Instructions (Addendum)
Remember:   Spread your carbohydrates throughout the day.   Aim for no more than 3-4 servings of carbohydrates (or "carb servings") per meal.  Aim for no more than 1-2 servings of carbohydrates per snack.   Try to always eat protein with carbohydrate foods.    Websites to help calculate nutrition info:   TennisProfile.co.uk  http://vang.com/

## 2018-09-25 NOTE — Progress Notes (Signed)
Diabetes Self-Management Education  Visit Type: First/Initial  Appt. Start Time: 9:20am Appt. End Time: 10:50am  09/25/2018  [name], identified by name and date of birth, is a 73 y.o. male with a diagnosis of Diabetes: Type 2.   ASSESSMENT  Patient is very personable and interested in managing his diabetes. He arrived to the appointment today with his wife. Pt has a few grown children, one of which lives with he and his wife.   Checks blood sugar once daily- fasting before any food/beverage. Averages between 100 - 135 for past 1 1/2 months. States he is frustrated with his A1c and does not understand why it is so high when his blood sugar numbers are where they are.   Pt and his wife are both knowledgeable about nutrition and aim to follow a well-balanced diet which focuses on plants. Pt does the cooking at home, and particularly likes baking his own bread. States that he recently switched all purpose flour for almond flour and noticed a significant difference in his fasting blood sugar numbers. Meals and snacks often involve a lot of carbohydrates, especially bread, rice, corn, and peas.    Diabetes Self-Management Education - 09/25/18 1740      Visit Information   Visit Type  First/Initial      Initial Visit   Diabetes Type  Type 2    Are you currently following a meal plan?  Yes    What type of meal plan do you follow?  flexitarian    Are you taking your medications as prescribed?  Yes    Date Diagnosed  2011      Health Coping   How would you rate your overall health?  Good      Psychosocial Assessment   Patient Belief/Attitude about Diabetes  Motivated to manage diabetes    Self-care barriers  None    Self-management support  Doctor's office;Family    Other persons present  Patient;Spouse/SO   wife   Patient Concerns  Nutrition/Meal planning;Glycemic Control   A1c   Special Needs  None    Preferred Learning Style  Visual;Hands on    Learning Readiness   Ready    How often do you need to have someone help you when you read instructions, pamphlets, or other written materials from your doctor or pharmacy?  1 - Never    What is the last grade level you completed in school?  masters degree      Complications   Last HgB A1C per patient/outside source  8.7 %    How often do you check your blood sugar?  1-2 times/day    Fasting Blood glucose range (mg/dL)  70-129;130-179    Have you had a dilated eye exam in the past 12 months?  Yes    Have you had a dental exam in the past 12 months?  Yes    Are you checking your feet?  Yes    How many days per week are you checking your feet?  7      Exercise   Exercise Type  ADL's;Light (walking / raking leaves)    How many days per week to you exercise?  5    How many minutes per day do you exercise?  60    Total minutes per week of exercise  300      Patient Education   Previous Diabetes Education  No    Disease state   Definition of diabetes, type 1 and 2,  and the diagnosis of diabetes;Factors that contribute to the development of diabetes;Explored patient's options for treatment of their diabetes    Nutrition management   Role of diet in the treatment of diabetes and the relationship between the three main macronutrients and blood glucose level;Food label reading, portion sizes and measuring food.;Carbohydrate counting;Effects of alcohol on blood glucose and safety factors with consumption of alcohol.;Information on hints to eating out and maintain blood glucose control.;Meal options for control of blood glucose level and chronic complications.    Physical activity and exercise   Role of exercise on diabetes management, blood pressure control and cardiac health.    Monitoring  Taught/discussed recording of test results and interpretation of SMBG.;Identified appropriate SMBG and/or A1C goals.    Chronic complications  Relationship between chronic complications and blood glucose control;Identified and  discussed with patient  current chronic complications;Lipid levels, blood glucose control and heart disease      Individualized Goals (developed by patient)   Nutrition  Follow meal plan discussed   Utilize carbohydrate counting to monitor overall carb intake (3-4 servings/meal and 0-2 servings/snack.)   Physical Activity  Exercise 5-7 days per week    Medications  take my medication as prescribed    Monitoring   test my blood glucose as discussed   fasting daily     Outcomes   Expected Outcomes  Demonstrated interest in learning. Expect positive outcomes    Future DMSE  3-4 months    Program Status  Not Completed       Individualized Plan for Diabetes Self-Management Training:   Learning Objective:  Patient will have a greater understanding of diabetes self-management. Patient education plan is to attend individual and/or group sessions per assessed needs and concerns.   Plan:  Patient Instructions  Remember:   Spread your carbohydrates throughout the day.   Aim for no more than 3-4 servings of carbohydrates (or "carb servings") per meal.  Aim for no more than 1-2 servings of carbohydrates per snack.   Try to always eat protein with carbohydrate foods.    Websites to help calculate nutrition info:   TennisProfile.co.uk  http://vang.com/    Expected Outcomes:  Demonstrated interest in learning. Expect positive outcomes  Education material provided: ADA - How to Thrive: A Guide for Your Journey with Diabetes, Snack sheet and Carbohydrate counting sheet, Meal Ideas, Types of Diabetes & Blood Glucose Control  If problems or questions, patient to contact team via:  Phone and Email  RD's Notes for Next Visit:  - review carbohydrate counting  - types of fat (relation to cholesterol levels)  - address hypo/hyperglycemia and other acute/chronic complications   Future DSME appointment: 3-4 months

## 2018-09-27 ENCOUNTER — Other Ambulatory Visit: Payer: Self-pay | Admitting: Cardiology

## 2018-09-30 ENCOUNTER — Encounter: Payer: Self-pay | Admitting: Family

## 2018-09-30 DIAGNOSIS — R69 Illness, unspecified: Secondary | ICD-10-CM | POA: Diagnosis not present

## 2018-09-30 MED ORDER — ONETOUCH VERIO VI STRP: ORAL_STRIP | 4 refills | Status: DC

## 2018-10-01 ENCOUNTER — Ambulatory Visit: Payer: Medicare HMO | Admitting: Pharmacist Clinician (PhC)/ Clinical Pharmacy Specialist

## 2018-10-01 ENCOUNTER — Other Ambulatory Visit: Payer: Self-pay

## 2018-10-01 DIAGNOSIS — Z952 Presence of prosthetic heart valve: Secondary | ICD-10-CM | POA: Diagnosis not present

## 2018-10-01 DIAGNOSIS — I359 Nonrheumatic aortic valve disorder, unspecified: Secondary | ICD-10-CM

## 2018-10-01 DIAGNOSIS — Z7901 Long term (current) use of anticoagulants: Secondary | ICD-10-CM

## 2018-10-01 LAB — POCT INR: INR: 3.2 — AB (ref 2.0–3.0)

## 2018-10-03 ENCOUNTER — Other Ambulatory Visit: Payer: Self-pay | Admitting: Family

## 2018-10-04 ENCOUNTER — Encounter: Payer: Self-pay | Admitting: Family

## 2018-10-04 ENCOUNTER — Other Ambulatory Visit: Payer: Self-pay | Admitting: Family

## 2018-10-15 ENCOUNTER — Ambulatory Visit: Payer: Medicare HMO | Admitting: Dietician

## 2018-10-17 ENCOUNTER — Inpatient Hospital Stay (HOSPITAL_BASED_OUTPATIENT_CLINIC_OR_DEPARTMENT_OTHER): Payer: Medicare HMO | Admitting: Hematology & Oncology

## 2018-10-17 ENCOUNTER — Encounter: Payer: Self-pay | Admitting: Hematology & Oncology

## 2018-10-17 ENCOUNTER — Inpatient Hospital Stay: Payer: Medicare HMO | Attending: Hematology & Oncology

## 2018-10-17 ENCOUNTER — Other Ambulatory Visit: Payer: Self-pay

## 2018-10-17 DIAGNOSIS — Z79899 Other long term (current) drug therapy: Secondary | ICD-10-CM | POA: Diagnosis not present

## 2018-10-17 DIAGNOSIS — Z794 Long term (current) use of insulin: Secondary | ICD-10-CM | POA: Insufficient documentation

## 2018-10-17 DIAGNOSIS — Z7901 Long term (current) use of anticoagulants: Secondary | ICD-10-CM | POA: Diagnosis not present

## 2018-10-17 DIAGNOSIS — C61 Malignant neoplasm of prostate: Secondary | ICD-10-CM | POA: Insufficient documentation

## 2018-10-17 DIAGNOSIS — E119 Type 2 diabetes mellitus without complications: Secondary | ICD-10-CM | POA: Insufficient documentation

## 2018-10-17 DIAGNOSIS — Z923 Personal history of irradiation: Secondary | ICD-10-CM | POA: Insufficient documentation

## 2018-10-17 DIAGNOSIS — Z7982 Long term (current) use of aspirin: Secondary | ICD-10-CM | POA: Insufficient documentation

## 2018-10-17 LAB — CBC WITH DIFFERENTIAL (CANCER CENTER ONLY)
Abs Immature Granulocytes: 0.02 10*3/uL (ref 0.00–0.07)
Basophils Absolute: 0.1 10*3/uL (ref 0.0–0.1)
Basophils Relative: 1 %
Eosinophils Absolute: 0.2 10*3/uL (ref 0.0–0.5)
Eosinophils Relative: 3 %
HCT: 37.5 % — ABNORMAL LOW (ref 39.0–52.0)
Hemoglobin: 12.6 g/dL — ABNORMAL LOW (ref 13.0–17.0)
Immature Granulocytes: 0 %
Lymphocytes Relative: 18 %
Lymphs Abs: 1.3 10*3/uL (ref 0.7–4.0)
MCH: 30.9 pg (ref 26.0–34.0)
MCHC: 33.6 g/dL (ref 30.0–36.0)
MCV: 91.9 fL (ref 80.0–100.0)
Monocytes Absolute: 0.6 10*3/uL (ref 0.1–1.0)
Monocytes Relative: 8 %
Neutro Abs: 5.2 10*3/uL (ref 1.7–7.7)
Neutrophils Relative %: 70 %
Platelet Count: 244 10*3/uL (ref 150–400)
RBC: 4.08 MIL/uL — ABNORMAL LOW (ref 4.22–5.81)
RDW: 12.8 % (ref 11.5–15.5)
WBC Count: 7.5 10*3/uL (ref 4.0–10.5)
nRBC: 0 % (ref 0.0–0.2)

## 2018-10-17 LAB — CMP (CANCER CENTER ONLY)
ALT: 21 U/L (ref 0–44)
AST: 22 U/L (ref 15–41)
Albumin: 4.1 g/dL (ref 3.5–5.0)
Alkaline Phosphatase: 46 U/L (ref 38–126)
Anion gap: 9 (ref 5–15)
BUN: 50 mg/dL — ABNORMAL HIGH (ref 8–23)
CO2: 26 mmol/L (ref 22–32)
Calcium: 9 mg/dL (ref 8.9–10.3)
Chloride: 102 mmol/L (ref 98–111)
Creatinine: 1.81 mg/dL — ABNORMAL HIGH (ref 0.61–1.24)
GFR, Est AFR Am: 42 mL/min — ABNORMAL LOW (ref >60)
GFR, Estimated: 36 mL/min — ABNORMAL LOW (ref >60)
Glucose, Bld: 233 mg/dL — ABNORMAL HIGH (ref 70–99)
Potassium: 4.2 mmol/L (ref 3.5–5.1)
Sodium: 137 mmol/L (ref 135–145)
Total Bilirubin: 0.3 mg/dL (ref 0.3–1.2)
Total Protein: 6.8 g/dL (ref 6.5–8.1)

## 2018-10-17 NOTE — Progress Notes (Signed)
Hematology and Oncology Follow Up Visit  [name]:[id number] Jul 24, 1945 73 y.o. 10/17/2018   Principle Diagnosis:  1. Hemochromatosis (H63D homozygous mutation). 2. Stage I prostate cancer, status post radiation therapy and     radiation seeds.  Current Therapy:    Phlebotomy to maintain a ferritin less than 100      Interim History:  [name] is back for followup.  He is doing quite well.  We see him every 6 months.  So far, he is doing quite well.  He has had a good summer.  He has had no problems with nausea or vomiting.  He is diabetic.  He is trying to watch his blood sugars carefully.  He is trying to modify his diet.  We do not know how his iron studies will go with this but I think that his iron is can be all that bad.   There is been no issues with nausea or vomiting.  He has had no problems with bowels or bladder.  His last iron studies showed a ferritin of 58 with an iron saturation of 16%.  Overall, his performance status is ECOG 1.    Medications:  Current Outpatient Medications:  .  aspirin EC 81 MG tablet, Take 1 tablet (81 mg total) by mouth daily., Disp: 90 tablet, Rfl: 3 .  atorvastatin (LIPITOR) 20 MG tablet, TAKE 1 TABLET (20 MG TOTAL) BY MOUTH DAILY. OFFICE VISIT NEEDED, Disp: 30 tablet, Rfl: 1 .  betamethasone dipropionate (DIPROLENE) 0.05 % cream, Apply topically 2 (two) times daily as needed. To eczema rash, Disp: 30 g, Rfl: 1 .  betamethasone valerate ointment (VALISONE) 0.1 %, Apply 1 application topically 2 (two) times daily., Disp: 30 g, Rfl: 0 .  Blood Glucose Monitoring Suppl Supplies MISC, Use for monitoring glucose level, Disp: 100 each, Rfl: 1 .  colchicine 0.6 MG tablet, Take 2 tabs by mouth now and then 1 tab in 1 hour for gout., Disp: 3 tablet, Rfl: 0 .  fenofibrate (TRICOR) 145 MG tablet, Take 1 tablet (145 mg total) by mouth daily., Disp: 90 tablet, Rfl: 1 .  furosemide (LASIX) 20 MG tablet, TAKE 1 TABLET (20 MG TOTAL) BY MOUTH  DAILY AS NEEDED. FOR SWELLING, Disp: 90 tablet, Rfl: 1 .  glucose blood (ONETOUCH VERIO) test strip, USE AS INSTRUCTED TO CHECK BLOOD SUGAR TWICE A DAY. DX E11.8, Disp: 100 each, Rfl: 4 .  imiquimod (ALDARA) 5 % cream, Apply topically 3 (three) times a week., Disp: , Rfl:  .  Insulin Glargine (BASAGLAR KWIKPEN) 100 UNIT/ML SOPN, Inject 0.57 mLs (57 Units total) into the skin daily., Disp: 45 mL, Rfl: 1 .  metoprolol succinate (TOPROL-XL) 25 MG 24 hr tablet, Take 1 tablet (25 mg total) by mouth daily., Disp: 90 tablet, Rfl: 1 .  NOVOFINE PLUS 32G X 4 MM MISC, USE FOR INJECTIONS INTO THE SKIN EVERY EVENING, Disp: 100 each, Rfl: 3 .  olmesartan-hydrochlorothiazide (BENICAR HCT) 20-12.5 MG tablet, Take 0.5 tablets by mouth daily., Disp: 45 tablet, Rfl: 1 .  omega-3 acid ethyl esters (LOVAZA) 1 g capsule, Take 1 capsule (1 g total) by mouth 2 (two) times daily., Disp: 180 capsule, Rfl: 1 .  ONETOUCH DELICA LANCETS FINE MISC, Use to check blood sugar twice a day.  Dx  E11.8, Disp: 200 each, Rfl: 1 .  OVER THE COUNTER MEDICATION, OLIVE OIL. Take 1 tablespoon (1480mg ) every afternoon, Disp: , Rfl:  .  oxyCODONE-acetaminophen (PERCOCET/ROXICET) 5-325 MG tablet, Take 1  tablet by mouth every 6 (six) hours as needed for severe pain., Disp: 10 tablet, Rfl: 0 .  warfarin (COUMADIN) 7.5 MG tablet, TAKE 1/2 TO 1 TABLET DAILY AS DIRECTED BY COUMADIN CLINIC, Disp: 90 tablet, Rfl: 1  Allergies: No Known Allergies  Past Medical History, Surgical history, Social history, and Family History were reviewed and updated.  Review of Systems: Review of Systems  Constitutional: Negative.   HENT: Negative.   Eyes: Negative.   Respiratory: Negative.   Cardiovascular: Negative.   Gastrointestinal: Negative.   Genitourinary: Negative.   Musculoskeletal: Negative.   Skin: Negative.   Neurological: Negative.   Endo/Heme/Allergies: Negative.   Psychiatric/Behavioral: Negative.      Physical Exam:  weight is 219 lb  (99.3 kg). His temporal temperature is 97.1 F (36.2 C) (abnormal). His blood pressure is 145/73 (abnormal) and his pulse is 61. His respiration is 19 and oxygen saturation is 99%.   Physical Exam Vitals signs reviewed.  HENT:     Head: Normocephalic and atraumatic.  Eyes:     Pupils: Pupils are equal, round, and reactive to light.  Neck:     Musculoskeletal: Normal range of motion.  Cardiovascular:     Rate and Rhythm: Normal rate and regular rhythm.     Heart sounds: Normal heart sounds.  Pulmonary:     Effort: Pulmonary effort is normal.     Breath sounds: Normal breath sounds.  Abdominal:     General: Bowel sounds are normal.     Palpations: Abdomen is soft.  Musculoskeletal: Normal range of motion.        General: No tenderness or deformity.  Lymphadenopathy:     Cervical: No cervical adenopathy.  Skin:    General: Skin is warm and dry.     Findings: No erythema or rash.  Neurological:     Mental Status: He is alert and oriented to person, place, and time.  Psychiatric:        Behavior: Behavior normal.        Thought Content: Thought content normal.        Judgment: Judgment normal.     Lab Results  Component Value Date   WBC 7.5 10/17/2018   HGB 12.6 (L) 10/17/2018   HCT 37.5 (L) 10/17/2018   MCV 91.9 10/17/2018   PLT 244 10/17/2018     Chemistry      Component Value Date/Time   NA 137 10/17/2018 1511   NA 133 (L) 03/30/2016 1507   NA 138 09/23/2015 1020   K 4.2 10/17/2018 1511   K 4.3 03/30/2016 1507   K 4.4 09/23/2015 1020   CL 102 10/17/2018 1511   CL 99 03/30/2016 1507   CO2 26 10/17/2018 1511   CO2 25 03/30/2016 1507   CO2 25 09/23/2015 1020   BUN 50 (H) 10/17/2018 1511   BUN 31 (H) 03/30/2016 1507   BUN 28.4 (H) 09/23/2015 1020   CREATININE 1.81 (H) 10/17/2018 1511   CREATININE 1.30 (H) 03/30/2016 1507   CREATININE 1.3 09/23/2015 1020      Component Value Date/Time   CALCIUM 9.0 10/17/2018 1511   CALCIUM 10.1 03/30/2016 1507   CALCIUM  9.5 09/23/2015 1020   ALKPHOS 46 10/17/2018 1511   ALKPHOS 45 03/30/2016 1507   ALKPHOS 46 09/23/2015 1020   AST 22 10/17/2018 1511   AST 41 (H) 09/23/2015 1020   ALT 21 10/17/2018 1511   ALT 29 09/23/2015 1020   BILITOT 0.3 10/17/2018 1511   BILITOT  0.46 09/23/2015 1020         Impression and Plan: Mr. Chico is a 79- gentleman with hemochromatosis. He is homozygous for the H63D mutation. He is doing quite well.  We will see what his iron studies show.  I would have to believe that they will still be low enough that he will not need to be phlebotomized.  We will see him back in 6 more months.Volanda Napoleon, MD 9/17/20204:06 PM

## 2018-10-18 ENCOUNTER — Encounter: Payer: Self-pay | Admitting: Family

## 2018-10-18 ENCOUNTER — Telehealth: Payer: Self-pay | Admitting: Hematology & Oncology

## 2018-10-18 ENCOUNTER — Telehealth: Payer: Self-pay | Admitting: *Deleted

## 2018-10-18 LAB — IRON AND TIBC
Iron: 81 ug/dL (ref 42–163)
Saturation Ratios: 18 % — ABNORMAL LOW (ref 20–55)
TIBC: 445 ug/dL — ABNORMAL HIGH (ref 202–409)
UIBC: 364 ug/dL (ref 117–376)

## 2018-10-18 LAB — FERRITIN: Ferritin: 57 ng/mL (ref 24–336)

## 2018-10-18 NOTE — Telephone Encounter (Signed)
Patient notified per order of Dr. Marin Olp that "the iron level is ok!  No need for a phlebotomy!"  Pt appreciative of call and has no questions or concerns at this time.

## 2018-10-18 NOTE — Telephone Encounter (Signed)
-----   Message from Volanda Napoleon, MD sent at 10/18/2018 11:35 AM EDT ----- Call - the iron level is ok!!  No need for a phlebotomy!!  Laurey Arrow

## 2018-10-18 NOTE — Telephone Encounter (Signed)
Unable to reach pt. No answer, no vm available

## 2018-10-18 NOTE — Telephone Encounter (Signed)
Confirmed March appts with patient per 9/17 LOS

## 2018-10-23 ENCOUNTER — Other Ambulatory Visit: Payer: Self-pay | Admitting: Cardiology

## 2018-10-23 DIAGNOSIS — R011 Cardiac murmur, unspecified: Secondary | ICD-10-CM

## 2018-10-23 DIAGNOSIS — R69 Illness, unspecified: Secondary | ICD-10-CM | POA: Diagnosis not present

## 2018-10-25 ENCOUNTER — Other Ambulatory Visit: Payer: Self-pay

## 2018-10-25 DIAGNOSIS — IMO0002 Reserved for concepts with insufficient information to code with codable children: Secondary | ICD-10-CM

## 2018-10-25 DIAGNOSIS — E1165 Type 2 diabetes mellitus with hyperglycemia: Secondary | ICD-10-CM

## 2018-10-25 NOTE — Telephone Encounter (Signed)
Patient wanted to be referred to endocrinology. Referral entered.

## 2018-10-27 DIAGNOSIS — R69 Illness, unspecified: Secondary | ICD-10-CM | POA: Diagnosis not present

## 2018-10-28 ENCOUNTER — Other Ambulatory Visit: Payer: Self-pay

## 2018-10-28 ENCOUNTER — Ambulatory Visit (INDEPENDENT_AMBULATORY_CARE_PROVIDER_SITE_OTHER): Payer: Medicare HMO | Admitting: Pharmacist Clinician (PhC)/ Clinical Pharmacy Specialist

## 2018-10-28 ENCOUNTER — Ambulatory Visit: Payer: Medicare HMO | Admitting: Cardiology

## 2018-10-28 DIAGNOSIS — Z952 Presence of prosthetic heart valve: Secondary | ICD-10-CM

## 2018-10-28 DIAGNOSIS — I359 Nonrheumatic aortic valve disorder, unspecified: Secondary | ICD-10-CM

## 2018-10-28 DIAGNOSIS — Z7901 Long term (current) use of anticoagulants: Secondary | ICD-10-CM

## 2018-10-28 LAB — POCT INR: INR: 2.5 (ref 2.0–3.0)

## 2018-10-30 ENCOUNTER — Encounter: Payer: Self-pay | Admitting: Family

## 2018-10-30 DIAGNOSIS — Z952 Presence of prosthetic heart valve: Secondary | ICD-10-CM | POA: Diagnosis not present

## 2018-10-30 DIAGNOSIS — I129 Hypertensive chronic kidney disease with stage 1 through stage 4 chronic kidney disease, or unspecified chronic kidney disease: Secondary | ICD-10-CM | POA: Diagnosis not present

## 2018-10-30 DIAGNOSIS — E1122 Type 2 diabetes mellitus with diabetic chronic kidney disease: Secondary | ICD-10-CM | POA: Diagnosis not present

## 2018-10-30 DIAGNOSIS — M25512 Pain in left shoulder: Secondary | ICD-10-CM

## 2018-10-30 DIAGNOSIS — N183 Chronic kidney disease, stage 3 (moderate): Secondary | ICD-10-CM | POA: Diagnosis not present

## 2018-10-31 ENCOUNTER — Encounter: Payer: Self-pay | Admitting: Family Medicine

## 2018-10-31 ENCOUNTER — Ambulatory Visit: Payer: Self-pay

## 2018-10-31 ENCOUNTER — Other Ambulatory Visit: Payer: Self-pay

## 2018-10-31 ENCOUNTER — Ambulatory Visit: Payer: Medicare HMO | Admitting: Family Medicine

## 2018-10-31 VITALS — BP 111/74 | HR 62 | Ht 69.0 in | Wt 214.0 lb

## 2018-10-31 DIAGNOSIS — M19012 Primary osteoarthritis, left shoulder: Secondary | ICD-10-CM | POA: Diagnosis not present

## 2018-10-31 DIAGNOSIS — M25512 Pain in left shoulder: Secondary | ICD-10-CM

## 2018-10-31 HISTORY — DX: Primary osteoarthritis, left shoulder: M19.012

## 2018-10-31 MED ORDER — TRIAMCINOLONE ACETONIDE 40 MG/ML IJ SUSP
40.0000 mg | Freq: Once | INTRAMUSCULAR | Status: AC
  Administered 2018-10-31: 40 mg via INTRA_ARTICULAR

## 2018-10-31 NOTE — Patient Instructions (Signed)
Nice to meet you Please use tylenol  Please use ice   Please use the sling as needed  Please send me a message in [place] with any questions or updates.  Please see me back in 4 weeks.   --Dr. Raeford Razor

## 2018-10-31 NOTE — Progress Notes (Signed)
[name] - 73 y.o. male MRN [id number]  Date of birth: May 03, 1945  SUBJECTIVE:  Including CC & ROS.  Chief Complaint  Patient presents with  . Shoulder Pain    left shoulder    [name] is a 73 y.o. male that is presenting with acute left shoulder pain.  He was moving some bricks on Sunday and had significant pain.  The pain has been intermittent since that time.  The pain seems to be worse at night.  He has had to use a heating pad to help him fall asleep.  Last night he had a put his arm in the sling which helped him fall asleep.  The pain is throbbing in nature.  It is localized to the shoulder.  He denies any fall since August.  Denies any bruising and is currently on warfarin.  No radicular symptoms.  Independent review of the left shoulder x-ray from 6/10 shows severe osteoarthritis of the left shoulder.   Review of Systems  Constitutional: Negative for fever.  HENT: Negative for congestion.   Respiratory: Negative for cough.   Cardiovascular: Negative for chest pain.  Gastrointestinal: Negative for abdominal pain.  Musculoskeletal: Positive for arthralgias and joint swelling.  Skin: Negative for color change.  Neurological: Negative for weakness.  Hematological: Negative for adenopathy.    HISTORY: Past Medical, Surgical, Social, and Family History Reviewed & Updated per EMR.   Pertinent Historical Findings include:  Past Medical History:  Diagnosis Date  . Allergy   . Aortic stenosis   . CAD (coronary artery disease)    cabg  . Cancer [facility]) 2011   prostate  . Erectile dysfunction   . Heart murmur   . Hemochromatosis 02/14/2011  . Hemorrhoids   . History of gout   . History of hepatitis 1974  . Hyperlipidemia   . Hypertension   . Hypertr obst cardiomyop   . Obesity    moderate  . Sensorineural hearing loss   . Type II or unspecified type diabetes mellitus without mention of complication, not stated as uncontrolled     Past Surgical History:   Procedure Laterality Date  . 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 APPENDECTOMY  1990  . CHOLECYSTECTOMY  1990  . CORONARY ARTERY BYPASS GRAFT  10/2003  . HERNIA REPAIR  1999   right, inguinal  . HERNIA REPAIR  2002   left, inguinal  . HIP SURGERY  2006   right hip  . PILONIDAL CYST EXCISION  1964  . prostate seed implant  3/12  . TONSILLECTOMY  childhood    No Known Allergies  Family History  Problem Relation Age of Onset  . Heart disease Mother   . Stroke Mother   . Heart disease Father   . Asperger's syndrome Son   . Hyperlipidemia Son   . Coronary artery disease Brother   . Cancer Neg Hx        negative for colon cancer     Social History   Socioeconomic History  . Marital status: Married    Spouse name: Not on file  . Number of children: Not on file  . Years of education: Not on file  . Highest education level: Not on file  Occupational History  . Not on file  Social Needs  . Financial resource strain: Not on file  . Food insecurity    Worry: Not on file    Inability: Not on file  . Transportation  needs    Medical: Not on file    Non-medical: Not on file  Tobacco Use  . Smoking status: Former Smoker    Types: Cigars  . Smokeless tobacco: Never Used  . Tobacco comment: Never used Tobacco  Substance and Sexual Activity  . Alcohol use: Yes    Alcohol/week: 0.0 standard drinks    Comment: 4 glasses wine a month  . Drug use: No  . Sexual activity: Never  Lifestyle  . Physical activity    Days per week: Not on file    Minutes per session: Not on file  . Stress: Not on file  Relationships  . Social Herbalist on phone: Not on file    Gets together: Not on file    Attends religious service: Not on file    Active member of club or organization: Not on file    Attends meetings of clubs or organizations: Not on file    Relationship status: Not on file  . Intimate partner violence    Fear of current or ex partner: Not on file     Emotionally abused: Not on file    Physically abused: Not on file    Forced sexual activity: Not on file  Other Topics Concern  . Not on file  Social History Narrative  . Not on file     PHYSICAL EXAM:  VS: BP 111/74   Pulse 62   Ht 5\' 9"  (1.753 m)   Wt 214 lb (97.1 kg)   BMI 31.60 kg/m  Physical Exam Gen: NAD, alert, cooperative with exam, well-appearing ENT: normal lips, normal nasal mucosa,  Eye: normal EOM, normal conjunctiva and lids CV:  no edema, +2 pedal pulses   Resp: no accessory muscle use, non-labored,  Skin: no rashes, no areas of induration  Neuro: normal tone, normal sensation to touch Psych:  normal insight, alert and oriented MSK:  Left shoulder: Limited active flexion abduction. Limited external rotation. Limited external rotation and abduction.  Pain with this movement. Normal strength resistance with internal and external rotation. Normal empty can testing. Neurovascularly intact  Limited ultrasound: Left shoulder:  Biceps tendon has fraying with chronic degenerative changes.  No effusion. There is an anterior fusion in the anterior capsule. Supraspinatus with mild chronic changes. Moderate effusion of the posterior glenohumeral joint  Summary: Effusion of the joint to likely represent exacerbation of underlying degenerative changes  Ultrasound and interpretation by Clearance Coots, MD   Aspiration/Injection Procedure Note [name] [birth year]  Procedure: Injection Indications: Left shoulder pain  Procedure Details Consent: Risks of procedure as well as the alternatives and risks of each were explained to the (patient/caregiver).  Consent for procedure obtained. Time Out: Verified patient identification, verified procedure, site/side was marked, verified correct patient position, special equipment/implants available, medications/allergies/relevent history reviewed, required imaging and test results available.  Performed.  The area was  cleaned with iodine and alcohol swabs.    The left glenohumeral joint was injected using 1 cc's of 40 mg Kenalog and 4 cc's of 0.25% bupivacaine with a 22 1 1/2" needle.  Ultrasound was used. Images were obtained in short views showing the injection.     A sterile dressing was applied.  Patient did tolerate procedure well.      ASSESSMENT & PLAN:   Primary osteoarthritis of left shoulder Likely an exacerbation of underlying arthritis.  Has severe changes on x-ray and an effusion today on ultrasound.   -Injection. -Counseled on supportive care. -  Discussed and counseled that if this keeps reoccurring it may warrant a shoulder replacement.

## 2018-10-31 NOTE — Assessment & Plan Note (Signed)
Likely an exacerbation of underlying arthritis.  Has severe changes on x-ray and an effusion today on ultrasound.   -Injection. -Counseled on supportive care. -Discussed and counseled that if this keeps reoccurring it may warrant a shoulder replacement.

## 2018-11-05 ENCOUNTER — Other Ambulatory Visit: Payer: Self-pay

## 2018-11-05 ENCOUNTER — Encounter: Payer: Self-pay | Admitting: Cardiology

## 2018-11-05 ENCOUNTER — Ambulatory Visit: Payer: Medicare HMO | Admitting: Cardiology

## 2018-11-05 VITALS — BP 122/64 | HR 56 | Temp 97.0°F | Ht 69.0 in | Wt 212.6 lb

## 2018-11-05 DIAGNOSIS — H9193 Unspecified hearing loss, bilateral: Secondary | ICD-10-CM

## 2018-11-05 DIAGNOSIS — E785 Hyperlipidemia, unspecified: Secondary | ICD-10-CM | POA: Diagnosis not present

## 2018-11-05 DIAGNOSIS — Z952 Presence of prosthetic heart valve: Secondary | ICD-10-CM

## 2018-11-05 DIAGNOSIS — Z7901 Long term (current) use of anticoagulants: Secondary | ICD-10-CM | POA: Diagnosis not present

## 2018-11-05 DIAGNOSIS — I25810 Atherosclerosis of coronary artery bypass graft(s) without angina pectoris: Secondary | ICD-10-CM | POA: Diagnosis not present

## 2018-11-05 DIAGNOSIS — I10 Essential (primary) hypertension: Secondary | ICD-10-CM | POA: Diagnosis not present

## 2018-11-05 DIAGNOSIS — Z951 Presence of aortocoronary bypass graft: Secondary | ICD-10-CM | POA: Insufficient documentation

## 2018-11-05 DIAGNOSIS — E118 Type 2 diabetes mellitus with unspecified complications: Secondary | ICD-10-CM | POA: Diagnosis not present

## 2018-11-05 DIAGNOSIS — I421 Obstructive hypertrophic cardiomyopathy: Secondary | ICD-10-CM | POA: Diagnosis not present

## 2018-11-05 DIAGNOSIS — E1165 Type 2 diabetes mellitus with hyperglycemia: Secondary | ICD-10-CM

## 2018-11-05 DIAGNOSIS — IMO0002 Reserved for concepts with insufficient information to code with codable children: Secondary | ICD-10-CM

## 2018-11-05 HISTORY — DX: Hyperlipidemia, unspecified: E78.5

## 2018-11-05 HISTORY — DX: Presence of aortocoronary bypass graft: Z95.1

## 2018-11-05 NOTE — Assessment & Plan Note (Signed)
Followed by PCP

## 2018-11-05 NOTE — Patient Instructions (Addendum)
Medication Instructions:  Your physician recommends that you continue on your current medications as directed. Please refer to the Current Medication list given to you today. If you need a refill on your cardiac medications before your next appointment, please call your pharmacy.   Lab work: None  If you have labs (blood work) drawn today and your tests are completely normal, you will receive your results only by: . MyChart Message (if you have MyChart) OR . A paper copy in the mail If you have any lab test that is abnormal or we need to change your treatment, we will call you to review the results.  Testing/Procedures: None   Follow-Up: At [facility], you and your health needs are our priority.  As part of our continuing mission to provide you with exceptional heart care, we have created designated Provider Care Teams.  These Care Teams include your primary Cardiologist (physician) and Advanced Practice Providers (APPs -  Physician Assistants and Nurse Practitioners) who all work together to provide you with the care you need, when you need it. You will need a follow up appointment in 12 months.  Please call our office 2 months in advance to schedule this appointment.  You may see Dr Brian Crenshaw or one of the following Advanced Practice Providers on your designated Care Team:   Luke Kilroy, PA-C Krista Kroeger, PA-C . Callie Goodrich, PA-C  Any Other Special Instructions Will Be Listed Below (If Applicable).   

## 2018-11-05 NOTE — Assessment & Plan Note (Addendum)
2005- 1. Coronary artery bypass grafting x4 with LIMA-LAD, SVG-OM1, SVG-AM and dCFX.  2. Aortic valve replacement with [employer] AVR 3. Septal myomectomy.  4. Myoview low risk 2016

## 2018-11-05 NOTE — Assessment & Plan Note (Signed)
SP septal myomectomy at surgery 2005 Echo Nov 2018- Hyperdynamic LVEF with severe basal septal hypertrophy. There is chordal SAM with resting gradient of 16 mmHg that increases to 85 mmHg with Valsalva

## 2018-11-05 NOTE — Progress Notes (Signed)
Cardiology Office Note:    Date:  11/05/2018   ID:  [name], DOB [birth year], MRN [id number]  PCP:  David Alar, NP  Cardiologist:  David Ruths, MD  Electrophysiologist:  None   Referring MD: David Alar, NP   Chief Complaint  Patient presents with  . Follow-up    History of Present Illness:    [name] is a 73 y.o. male with a hx of CAD, bicuspid aortic valve, and hypertrophic cardiomyopathy.  In 2005 he had CABG x4 and a Saint Jude AVR.  He also had septal myomectomy at that time.  His last echocardiogram in November 2018 showed vigorous LV function grade 1 diastolic dysfunction and prosthetic aortic valve with a mean gradient of 10 mmHg.  There was severe basal septal hypertrophy with S.A.M. and a resting gradient of 16 mg increasing to 85 mg with Valsalva.  The patient has been asymptomatic.  He last saw Dr. Stanford Montgomery in May 2019.    He is in the office today for routine follow-up.  Since we saw him last he is developed some issues with his left shoulder, at this point he does not need surgery although that is a possibility in the future.  His wife accompanied him today.  The patient is hard of hearing and she frequently interjected to clarify.  She says he has been walking every day for exercise. He denies any unusual dyspnea or chest pain.  No syncope or pre syncope.  In addition to his CAD and aortic stenosis he also sees Dr. Marin Montgomery for hereditary hemochromatosis and Dr. Hollie Montgomery for chronic renal insufficiency stage III.  His PCP is managing his diabetes.  His last hemoglobin A1c was 8.3, he tells me he is going to see a diabetic specialist.  Past Medical History:  Diagnosis Date  . Allergy   . Aortic stenosis   . CAD (coronary artery disease)    cabg  . Cancer [facility]) 2011   prostate  . Erectile dysfunction   . Heart murmur   . Hemochromatosis 02/14/2011  . Hemorrhoids   . History of gout   . History of hepatitis 1974  . Hyperlipidemia   .  Hypertension   . Hypertr obst cardiomyop   . Obesity    moderate  . Sensorineural hearing loss   . Type II or unspecified type diabetes mellitus without mention of complication, not stated as uncontrolled     Past Surgical History:  Procedure Laterality Date  . 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 APPENDECTOMY  1990  . CHOLECYSTECTOMY  1990  . CORONARY ARTERY BYPASS GRAFT  10/2003  . HERNIA REPAIR  1999   right, inguinal  . HERNIA REPAIR  2002   left, inguinal  . HIP SURGERY  2006   right hip  . PILONIDAL CYST EXCISION  1964  . prostate seed implant  3/12  . TONSILLECTOMY  childhood    Current Medications: Current Meds  Medication Sig  . aspirin EC 81 MG tablet Take 1 tablet (81 mg total) by mouth daily.  Marland Kitchen atorvastatin (LIPITOR) 20 MG tablet TAKE 1 TABLET (20 MG TOTAL) BY MOUTH DAILY. OFFICE VISIT NEEDED  . betamethasone dipropionate (DIPROLENE) 0.05 % cream Apply topically 2 (two) times daily as needed. To eczema rash  . betamethasone valerate ointment (VALISONE) 0.1 % Apply 1 application topically 2 (two) times daily.  . Blood Glucose Monitoring Suppl Supplies MISC Use for monitoring glucose level  . colchicine  0.6 MG tablet Take 2 tabs by mouth now and then 1 tab in 1 hour for gout.  . fenofibrate (TRICOR) 145 MG tablet Take 1 tablet (145 mg total) by mouth daily.  . furosemide (LASIX) 20 MG tablet TAKE 1 TABLET (20 MG TOTAL) BY MOUTH DAILY AS NEEDED. FOR SWELLING  . glucose blood (ONETOUCH VERIO) test strip USE AS INSTRUCTED TO CHECK BLOOD SUGAR TWICE A DAY. DX E11.8  . imiquimod (ALDARA) 5 % cream Apply topically 3 (three) times a week.  . Insulin Glargine (BASAGLAR KWIKPEN) 100 UNIT/ML SOPN Inject 0.57 mLs (57 Units total) into the skin daily.  . metoprolol succinate (TOPROL-XL) 25 MG 24 hr tablet Take 1 tablet (25 mg total) by mouth daily.  Marland Kitchen NOVOFINE PLUS 32G X 4 MM MISC USE FOR INJECTIONS INTO THE SKIN EVERY EVENING  . olmesartan-hydrochlorothiazide  (BENICAR HCT) 20-12.5 MG tablet Take 0.5 tablets by mouth daily.  Marland Kitchen omega-3 acid ethyl esters (LOVAZA) 1 g capsule TAKE 1 CAPSULE BY MOUTH TWICE A DAY  . ONETOUCH DELICA LANCETS FINE MISC Use to check blood sugar twice a day.  Dx  E11.8  . OVER THE COUNTER MEDICATION OLIVE OIL. Take 1 tablespoon (1480mg ) every afternoon  . oxyCODONE-acetaminophen (PERCOCET/ROXICET) 5-325 MG tablet Take 1 tablet by mouth every 6 (six) hours as needed for severe pain.  Marland Kitchen warfarin (COUMADIN) 7.5 MG tablet TAKE 1/2 TO 1 TABLET DAILY AS DIRECTED BY COUMADIN CLINIC     Allergies:   Patient has no known allergies.   Social History   Socioeconomic History  . Marital status: Married    Spouse name: Not on file  . Number of children: Not on file  . Years of education: Not on file  . Highest education level: Not on file  Occupational History  . Not on file  Social Needs  . Financial resource strain: Not on file  . Food insecurity    Worry: Not on file    Inability: Not on file  . Transportation needs    Medical: Not on file    Non-medical: Not on file  Tobacco Use  . Smoking status: Former Smoker    Types: Cigars  . Smokeless tobacco: Never Used  . Tobacco comment: Never used Tobacco  Substance and Sexual Activity  . Alcohol use: Yes    Alcohol/week: 0.0 standard drinks    Comment: 4 glasses wine a month  . Drug use: No  . Sexual activity: Never  Lifestyle  . Physical activity    Days per week: Not on file    Minutes per session: Not on file  . Stress: Not on file  Relationships  . Social Herbalist on phone: Not on file    Gets together: Not on file    Attends religious service: Not on file    Active member of club or organization: Not on file    Attends meetings of clubs or organizations: Not on file    Relationship status: Not on file  Other Topics Concern  . Not on file  Social History Narrative  . Not on file     Family History: The patient's family history includes  Asperger's syndrome in his son; Coronary artery disease in his brother; Heart disease in his father and mother; Hyperlipidemia in his son; Stroke in his mother. There is no history of Cancer.  ROS:   Please see the history of present illness.     All other systems reviewed and are  negative.  EKGs/Labs/Other Studies Reviewed:    The following studies were reviewed today: Echo Nov 2018  EKG:  EKG is ordered today.  The ekg ordered today demonstrates NSR, SB 56  Recent Labs: 10/17/2018: ALT 21; BUN 50; Creatinine 1.81; Hemoglobin 12.6; Platelet Count 244; Potassium 4.2; Sodium 137  Recent Lipid Panel    Component Value Date/Time   CHOL 157 09/06/2018 1210   TRIG 331.0 (H) 09/06/2018 1210   HDL 25.20 (L) 09/06/2018 1210   CHOLHDL 6 09/06/2018 1210   VLDL 66.2 (H) 09/06/2018 1210   LDLCALC 54 11/25/2010 1025   LDLDIRECT 83.0 09/06/2018 1210    Physical Exam:    VS:  BP 122/64   Pulse (!) 56   Temp (!) 97 F (36.1 C)   Ht 5\' 9"  (1.753 m)   Wt 212 lb 9.6 oz (96.4 kg)   BMI 31.40 kg/m     Wt Readings from Last 3 Encounters:  11/05/18 212 lb 9.6 oz (96.4 kg)  10/31/18 214 lb (97.1 kg)  10/17/18 219 lb (99.3 kg)     GEN:  Well nourished, well developed in no acute distress HEENT: Normal, HOH, hearing aid in place NECK: No JVD; No carotid bruits LYMPHATICS: No lymphadenopathy CARDIAC: RRR, no murmurs, rubs, gallops, positive valve sounds RESPIRATORY:  Clear to auscultation without rales, wheezing or rhonchi  ABDOMEN: Soft, non-tender, non-distended MUSCULOSKELETAL:  No edema; No deformity  SKIN: Warm and dry NEUROLOGIC:  Alert and oriented x 3 PSYCHIATRIC:  Normal affect   ASSESSMENT:    Hx of CABG 2005- 1. Coronary artery bypass grafting x4 with LIMA-LAD, SVG-OM1, SVG-AM and dCFX.  2. Aortic valve replacement with St Jude AVR 3. Septal myomectomy.  4. Myoview low risk 2016  Hypertrophic obstructive cardiomyopathy ([place]) SP septal myomectomy at surgery 2005 Echo  Nov 2018- Hyperdynamic LVEF with severe basal septal hypertrophy. There is chordal SAM with resting gradient of 16 mmHg that increases to 85 mmHg with Valsalva  History of aortic valve replacement St Jude AVR  DM (diabetes mellitus), type 2, uncontrolled with complications (HCC) Followed by PCP  Essential hypertension Controlled  HOH (hard of hearing)  PLAN:    No change in cardiac Rx.  I will ask Dr [name] if and when he wants to repeat his echo.    Medication Adjustments/Labs and Tests Ordered: Current medicines are reviewed at length with the patient today.  Concerns regarding medicines are outlined above.  Orders Placed This Encounter  Procedures  . EKG 12-Lead   No orders of the defined types were placed in this encounter.   Patient Instructions  Medication Instructions:  Your physician recommends that you continue on your current medications as directed. Please refer to the Current Medication list given to you today. If you need a refill on your cardiac medications before your next appointment, please call your pharmacy.   Lab work: None  If you have labs (blood work) drawn today and your tests are completely normal, you will receive your results only by: Marland Kitchen MyChart Message (if you have MyChart) OR . A paper copy in the mail If you have any lab test that is abnormal or we need to change your treatment, we will call you to review the results.  Testing/Procedures: None   Follow-Up: At [facility], you and your health needs are our priority.  As part of our continuing mission to provide you with exceptional heart care, we have created designated Provider Care Teams.  These Care Teams include  your primary Cardiologist (physician) and Advanced Practice Providers (APPs -  Physician Assistants and Nurse Practitioners) who all work together to provide you with the care you need, when you need it. You will need a follow up appointment in 12 months.  Please call our  office 2 months in advance to schedule this appointment.  You may see Dr [name] or one of the following Advanced Practice Providers on your designated Care Team:   Kerin Ransom, PA-C Roby Lofts, [place] . Sande Rives, PA-C  Any Other Special Instructions Will Be Listed Below (If Applicable).       Signed, Kerin Ransom, PA-C  11/05/2018 1:11 PM    [facility]

## 2018-11-05 NOTE — Assessment & Plan Note (Signed)
St Jude AVR

## 2018-11-05 NOTE — Assessment & Plan Note (Signed)
Controlled.  

## 2018-11-08 ENCOUNTER — Other Ambulatory Visit: Payer: Self-pay

## 2018-11-10 ENCOUNTER — Other Ambulatory Visit: Payer: Self-pay | Admitting: Family

## 2018-11-11 ENCOUNTER — Encounter: Payer: Self-pay | Admitting: Internal Medicine

## 2018-11-11 ENCOUNTER — Ambulatory Visit (INDEPENDENT_AMBULATORY_CARE_PROVIDER_SITE_OTHER): Payer: Medicare HMO | Admitting: Internal Medicine

## 2018-11-11 ENCOUNTER — Other Ambulatory Visit: Payer: Self-pay

## 2018-11-11 ENCOUNTER — Encounter: Payer: Self-pay | Admitting: Family

## 2018-11-11 VITALS — BP 138/64 | HR 61 | Temp 98.3°F | Ht 69.0 in | Wt 215.4 lb

## 2018-11-11 DIAGNOSIS — Z794 Long term (current) use of insulin: Secondary | ICD-10-CM

## 2018-11-11 DIAGNOSIS — N183 Chronic kidney disease, stage 3 unspecified: Secondary | ICD-10-CM | POA: Diagnosis not present

## 2018-11-11 DIAGNOSIS — E1165 Type 2 diabetes mellitus with hyperglycemia: Secondary | ICD-10-CM | POA: Diagnosis not present

## 2018-11-11 DIAGNOSIS — E1142 Type 2 diabetes mellitus with diabetic polyneuropathy: Secondary | ICD-10-CM | POA: Diagnosis not present

## 2018-11-11 DIAGNOSIS — E1122 Type 2 diabetes mellitus with diabetic chronic kidney disease: Secondary | ICD-10-CM | POA: Diagnosis not present

## 2018-11-11 DIAGNOSIS — E1159 Type 2 diabetes mellitus with other circulatory complications: Secondary | ICD-10-CM

## 2018-11-11 DIAGNOSIS — E119 Type 2 diabetes mellitus without complications: Secondary | ICD-10-CM | POA: Insufficient documentation

## 2018-11-11 HISTORY — DX: Chronic kidney disease, stage 3 unspecified: E11.22

## 2018-11-11 HISTORY — DX: Type 2 diabetes mellitus with diabetic polyneuropathy: E11.42

## 2018-11-11 MED ORDER — TRULICITY 0.75 MG/0.5ML ~~LOC~~ SOAJ: 0.7500 mg | SUBCUTANEOUS | 2 refills | Status: DC

## 2018-11-11 MED ORDER — BASAGLAR KWIKPEN 100 UNIT/ML ~~LOC~~ SOPN: 46.0000 [IU] | PEN_INJECTOR | Freq: Every day | SUBCUTANEOUS | 1 refills | Status: DC

## 2018-11-11 NOTE — Progress Notes (Signed)
Name: [name]  MRN/ DOB: [id number], [birth year]   Age/ Sex: 73 y.o., male    PCP: [name], NP   Reason for Endocrinology Evaluation: Type 2 Diabetes Mellitus     Date of Initial Endocrinology Visit: 11/11/2018     PATIENT IDENTIFIER: [name] is a 73 y.o. male with a past medical history of T2DM, OSA, HTN, CAD ( S/P CABG) . The patient presented for initial endocrinology clinic visit on 11/11/2018 for consultative assistance with his diabetes management.    HPI: [name] was    Diagnosed with DM around 2005 Prior Medications tried/Intolerance: Metformin - due to renal dysfunction  . Has been on insulin 2015 Currently checking blood sugars 1 x / day,  before breakfast  Hypoglycemia episodes : no           Hemoglobin A1c has ranged from 6.3% in 2015, peaking at 8.3% in 2020. Patient required assistance for hypoglycemia: no  Patient has required hospitalization within the last 1 year from hyper or hypoglycemia:   In terms of diet, the patient eats 3 meals a day , snacks 2x a day    HOME DIABETES REGIMEN: Basaglar 57 units daily    Statin: yes ACE-I/ARB:Yes  Prior Diabetic Education: yes   GLUCOSE LOG :  78-166 mg/dL    DIABETIC COMPLICATIONS: Microvascular complications:   CKDIII- Dr. Hollie Salk   Denies: neuropathy, retinopathy   Last eye exam: Completed 03/2018  Macrovascular complications:   CAD (S/P CABG )   Denies: PVD, CVA   PAST HISTORY: Past Medical History:  Past Medical History:  Diagnosis Date  . Allergy   . Aortic stenosis   . CAD (coronary artery disease)    cabg  . Cancer [facility]) 2011   prostate  . Erectile dysfunction   . Heart murmur   . Hemochromatosis 02/14/2011  . Hemorrhoids   . History of gout   . History of hepatitis 1974  . Hyperlipidemia   . Hypertension   . Hypertr obst cardiomyop   . Obesity    moderate  . Sensorineural hearing loss   . Type II or unspecified type diabetes mellitus  without mention of complication, not stated as uncontrolled    Past Surgical History:  Past Surgical History:  Procedure Laterality Date  . 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 APPENDECTOMY  1990  . CHOLECYSTECTOMY  1990  . CORONARY ARTERY BYPASS GRAFT  10/2003  . HERNIA REPAIR  1999   right, inguinal  . HERNIA REPAIR  2002   left, inguinal  . HIP SURGERY  2006   right hip  . PILONIDAL CYST EXCISION  1964  . prostate seed implant  3/12  . TONSILLECTOMY  childhood      Social History:  reports that he has quit smoking. His smoking use included cigars. He has never used smokeless tobacco. He reports current alcohol use. He reports that he does not use drugs. Family History:  Family History  Problem Relation Age of Onset  . Heart disease Mother   . Stroke Mother   . Heart disease Father   . Asperger's syndrome Son   . Hyperlipidemia Son   . Coronary artery disease Brother   . Cancer Neg Hx        negative for colon cancer     HOME MEDICATIONS: Allergies as of 11/11/2018   No Known Allergies     Medication List  Accurate as of November 11, 2018  9:56 AM. If you have any questions, ask your nurse or doctor.        aspirin EC 81 MG tablet Take 1 tablet (81 mg total) by mouth daily.   atorvastatin 20 MG tablet Commonly known as: LIPITOR TAKE 1 TABLET (20 MG TOTAL) BY MOUTH DAILY. OFFICE VISIT NEEDED   Basaglar KwikPen 100 UNIT/ML Sopn Inject 0.57 mLs (57 Units total) into the skin daily.   betamethasone dipropionate 0.05 % cream Apply topically 2 (two) times daily as needed. To eczema rash   betamethasone valerate ointment 0.1 % Commonly known as: VALISONE Apply 1 application topically 2 (two) times daily.   Blood Glucose Monitoring Suppl Supplies Misc Use for monitoring glucose level   colchicine 0.6 MG tablet Take 2 tabs by mouth now and then 1 tab in 1 hour for gout.   fenofibrate 145 MG tablet Commonly known as: TRICOR Take 1 tablet  (145 mg total) by mouth daily.   furosemide 20 MG tablet Commonly known as: LASIX TAKE 1 TABLET (20 MG TOTAL) BY MOUTH DAILY AS NEEDED. FOR SWELLING   imiquimod 5 % cream Commonly known as: ALDARA Apply topically 3 (three) times a week.   metoprolol succinate 25 MG 24 hr tablet Commonly known as: TOPROL-XL Take 1 tablet (25 mg total) by mouth daily.   NovoFine Plus 32G X 4 MM Misc Generic drug: Insulin Pen Needle USE FOR INJECTIONS INTO THE SKIN EVERY EVENING   olmesartan-hydrochlorothiazide 20-12.5 MG tablet Commonly known as: BENICAR HCT Take 0.5 tablets by mouth daily.   omega-3 acid ethyl esters 1 g capsule Commonly known as: LOVAZA TAKE 1 CAPSULE BY MOUTH TWICE A DAY   OneTouch Delica Lancets Fine Misc Use to check blood sugar twice a day.  Dx  E11.8   OneTouch Verio test strip Generic drug: glucose blood USE AS INSTRUCTED TO CHECK BLOOD SUGAR TWICE A DAY. DX E11.8   OVER THE COUNTER MEDICATION OLIVE OIL. Take 1 tablespoon (1480mg ) every afternoon   oxyCODONE-acetaminophen 5-325 MG tablet Commonly known as: PERCOCET/ROXICET Take 1 tablet by mouth every 6 (six) hours as needed for severe pain.   warfarin 7.5 MG tablet Commonly known as: COUMADIN Take as directed by the anticoagulation clinic. If you are unsure how to take this medication, talk to your nurse or doctor. Original instructions: TAKE 1/2 TO 1 TABLET DAILY AS DIRECTED BY COUMADIN CLINIC        ALLERGIES: No Known Allergies   REVIEW OF SYSTEMS: A comprehensive ROS was conducted with the patient and is negative except as per HPI and below:  ROS    OBJECTIVE:   VITAL SIGNS: BP 138/64 (BP Location: Right Arm, Patient Position: Sitting, Cuff Size: Normal)   Pulse 61   Temp 98.3 F (36.8 C)   Ht 5\' 9"  (1.753 m)   Wt 215 lb 6.4 oz (97.7 kg)   SpO2 97%   BMI 31.81 kg/m    PHYSICAL EXAM:  General: Pt appears well and is in NAD  Hydration: Well-hydrated with moist mucous membranes and good  skin turgor  HEENT: Head: Unremarkable with good dentition. Oropharynx clear without exudate.  Eyes: External eye exam normal without stare, lid lag or exophthalmos.  EOM intact.  PERRL.  Neck: General: Supple without adenopathy or carotid bruits. Thyroid: Thyroid size normal.  No goiter or nodules appreciated. No thyroid bruit.  Lungs: Clear with good BS bilat with no rales, rhonchi, or wheezes  Heart: RRR with  normal S1 and S2 and no gallops; no murmurs; no rub  Abdomen: Normoactive bowel sounds, soft, nontender, without masses or organomegaly palpable  Extremities:  Lower extremities - No pretibial edema. No lesions.  Skin: Normal texture and temperature to palpation. No rash noted. No Acanthosis nigricans/skin tags. No lipohypertrophy.  Neuro: MS is good with appropriate affect, pt is alert and Ox3    DM foot exam: 11/11/2018  The skin of the feet is intact without sores or ulcerations. The pedal pulses are 2+ on right and 2+ on left. The sensation is decreased at the toes  to a screening 5.07, 10 gram monofilament bilaterally   DATA REVIEWED:  Lab Results  Component Value Date   HGBA1C 8.3 (H) 09/06/2018   HGBA1C 8.2 (H) 03/12/2018   HGBA1C 7.2 (H) 12/04/2017   Lab Results  Component Value Date   MICROALBUR 8.7 (H) 09/09/2014   LDLCALC 54 11/25/2010   CREATININE 1.81 (H) 10/17/2018   Lab Results  Component Value Date   MICRALBCREAT 5.0 09/09/2014    Lab Results  Component Value Date   CHOL 157 09/06/2018   HDL 25.20 (L) 09/06/2018   LDLCALC 54 11/25/2010   LDLDIRECT 83.0 09/06/2018   TRIG 331.0 (H) 09/06/2018   CHOLHDL 6 09/06/2018        ASSESSMENT / PLAN / RECOMMENDATIONS:   1) Type 2 Diabetes Mellitus, Sub-Optimally controlled, With CKD III , Neuropathic and Macrovascular complications - Most recent A1c of 8.3 %. Goal A1c < 7.5%.    Plan: GENERAL:  Discussed pharmacokinetics of basal/bolus insulin.  We also discussed avoiding sugar-sweetened  beverages and snacks, when possible.   We discussed with his CKD , we are limited on oral glycemic agents, will try GLp-1 agonists  , he has no hx of pancreatis, we discussed GI side effects . We discussed that prandial insulin would be our last resort at this time.   MEDICATIONS: - Decrease Basaglar to 46 units daily  - Start Trulicity [id number] mg once weekly    EDUCATION / INSTRUCTIONS:  BG monitoring instructions: Patient is instructed to check his blood sugars 2 times a day, fasting and bedtime .  Call [facility] if: BG persistently < 70 or > 300. . I reviewed the Rule of 15 for the treatment of hypoglycemia in detail with the patient. Literature supplied.   2) Diabetic complications:   Eye: Does not have known diabetic retinopathy.   Neuro/ Feet: Does have known diabetic peripheral neuropathy.  Renal: Patient does have known baseline CKD. He is on an ACEI/ARB at present. He sees Dr. Hollie Salk   3) Lipids: Patient is on a statin.    4) Hypertension: He is   at goal of < 140/90 mmHg.    F/U in 6-8 weeks    Signed electronically by: Mack Guise, MD  [facility]  [facility] [address] [place], [place] [address] Phone: [phone] FAX: [phone]   CC: [name], [address] [place] [address] Phone: [phone]  Fax: [phone]    Return to Endocrinology clinic as below: Future Appointments  Date Time Provider [place]  11/28/2018 11:10 AM Rosemarie Ax, MD [facility]  12/09/2018  1:00 PM [facility]  04/10/2019  1:00 PM [facility] None  04/10/2019  1:30 PM [place], Rudell Cobb, MD [facility] None

## 2018-11-11 NOTE — Patient Instructions (Addendum)
-   Decrease Basaglar to 46 units daily  - Start Trulicity [id number] mg once weekly    - Check sugar Fasting and Bedtime       HOW TO TREAT LOW BLOOD SUGARS (Blood sugar LESS THAN 70 MG/DL)  Please follow the RULE OF 15 for the treatment of hypoglycemia treatment (when your (blood sugars are less than 70 mg/dL)    STEP 1: Take 15 grams of carbohydrates when your blood sugar is low, which includes:   3-4 GLUCOSE TABS  OR  3-4 OZ OF JUICE OR REGULAR SODA OR  ONE TUBE OF GLUCOSE GEL     STEP 2: RECHECK blood sugar in 15 MINUTES STEP 3: If your blood sugar is still low at the 15 minute recheck --> then, go back to STEP 1 and treat AGAIN with another 15 grams of carbohydrates.

## 2018-11-12 ENCOUNTER — Telehealth: Payer: Self-pay

## 2018-11-12 DIAGNOSIS — I421 Obstructive hypertrophic cardiomyopathy: Secondary | ICD-10-CM

## 2018-11-12 NOTE — Telephone Encounter (Signed)
-----   Message from Erlene Quan, [place] sent at 11/05/2018  4:36 PM EDT ----- T please schedule an echo at the patient's convenience and let Mr [facility] Dr Stanford Breed wants him to have it.   Kerin Ransom PA-C 11/05/2018 4:37 PM

## 2018-11-12 NOTE — Telephone Encounter (Signed)
Left message for patient informing him that Dr Stanford Breed would like for him to have a Echocardiogram. Advised him to contact office with any questions or concerns.   Order placed and message sent to schedulers.

## 2018-11-13 ENCOUNTER — Other Ambulatory Visit: Payer: Self-pay | Admitting: Family

## 2018-11-14 ENCOUNTER — Ambulatory Visit: Payer: Medicare HMO | Admitting: Internal Medicine

## 2018-11-14 DIAGNOSIS — R69 Illness, unspecified: Secondary | ICD-10-CM | POA: Diagnosis not present

## 2018-11-15 ENCOUNTER — Ambulatory Visit: Payer: Medicare HMO | Admitting: Internal Medicine

## 2018-11-22 ENCOUNTER — Ambulatory Visit (HOSPITAL_COMMUNITY): Payer: Medicare HMO | Attending: Cardiology

## 2018-11-22 ENCOUNTER — Other Ambulatory Visit: Payer: Self-pay

## 2018-11-22 DIAGNOSIS — C61 Malignant neoplasm of prostate: Secondary | ICD-10-CM | POA: Diagnosis not present

## 2018-11-22 DIAGNOSIS — I421 Obstructive hypertrophic cardiomyopathy: Secondary | ICD-10-CM | POA: Diagnosis not present

## 2018-11-22 MED ORDER — PERFLUTREN LIPID MICROSPHERE
1.0000 mL | INTRAVENOUS | Status: AC | PRN
  Administered 2018-11-22: 2 mL via INTRAVENOUS

## 2018-11-26 ENCOUNTER — Telehealth: Payer: Self-pay | Admitting: Cardiology

## 2018-11-26 NOTE — Telephone Encounter (Signed)
I called patient to discuss his echo results.  I told him it appeared to be basically unchanged from his last echo. He has basel septal hypertrophy and I suggested he avoid lifting heavy weights and squatting and to stay hydrated. He denies any DOE, near syncope, or syncope.  He is walking 1.5-2 miles a day.   Kerin Ransom PA-C 11/26/2018 4:49 PM

## 2018-11-27 DIAGNOSIS — C61 Malignant neoplasm of prostate: Secondary | ICD-10-CM | POA: Diagnosis not present

## 2018-11-28 ENCOUNTER — Other Ambulatory Visit: Payer: Self-pay

## 2018-11-28 ENCOUNTER — Encounter: Payer: Self-pay | Admitting: Family Medicine

## 2018-11-28 ENCOUNTER — Ambulatory Visit: Payer: Medicare HMO | Admitting: Family Medicine

## 2018-11-28 DIAGNOSIS — M19012 Primary osteoarthritis, left shoulder: Secondary | ICD-10-CM | POA: Diagnosis not present

## 2018-11-28 NOTE — Patient Instructions (Signed)
Good to see you Please try and continue the exercises  Please use ice and tylenol as needed   Please send me a message in MyChart with any questions or updates.  Please see [name] back as needed .   --Dr. Raeford Razor

## 2018-11-28 NOTE — Progress Notes (Signed)
[name] - 73 y.o. male MRN [id number]  Date of birth: 09/23/1945  SUBJECTIVE:  Including CC & ROS.  Chief Complaint  Patient presents with  . Follow-up    follow up for left shoulder    [name] is a 73 y.o. male that is following up for his left shoulder pain.  He received a glenohumeral injection on 10/1 and notes significant improvement of his pain.  He is still unable to lie on the affected side but is doing much better.  He has good range of motion.  He does not lift anything heavy.  But he feels comfortable with his arm at rest.  He does not use a sling that often.   Review of Systems  Constitutional: Negative for fever.  HENT: Negative for congestion.   Respiratory: Negative for cough.   Cardiovascular: Negative for chest pain.  Gastrointestinal: Negative for abdominal pain.  Musculoskeletal: Positive for arthralgias.  Skin: Negative for color change.  Neurological: Negative for weakness.  Hematological: Negative for adenopathy.    HISTORY: Past Medical, Surgical, Social, and Family History Reviewed & Updated per EMR.   Pertinent Historical Findings include:  Past Medical History:  Diagnosis Date  . Allergy   . Aortic stenosis   . CAD (coronary artery disease)    cabg  . Cancer [facility]) 2011   prostate  . Erectile dysfunction   . Heart murmur   . Hemochromatosis 02/14/2011  . Hemorrhoids   . History of gout   . History of hepatitis 1974  . Hyperlipidemia   . Hypertension   . Hypertr obst cardiomyop   . Obesity    moderate  . Sensorineural hearing loss   . Type II or unspecified type diabetes mellitus without mention of complication, not stated as uncontrolled     Past Surgical History:  Procedure Laterality Date  . 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 APPENDECTOMY  1990  . CHOLECYSTECTOMY  1990  . CORONARY ARTERY BYPASS GRAFT  10/2003  . HERNIA REPAIR  1999   right, inguinal  . HERNIA REPAIR  2002   left, inguinal  . HIP  SURGERY  2006   right hip  . PILONIDAL CYST EXCISION  1964  . prostate seed implant  3/12  . TONSILLECTOMY  childhood    No Known Allergies  Family History  Problem Relation Age of Onset  . Heart disease Mother   . Stroke Mother   . Heart disease Father   . Asperger's syndrome Son   . Hyperlipidemia Son   . Coronary artery disease Brother   . Cancer Neg Hx        negative for colon cancer     Social History   Socioeconomic History  . Marital status: Married    Spouse name: Not on file  . Number of children: Not on file  . Years of education: Not on file  . Highest education level: Not on file  Occupational History  . Not on file  Social Needs  . Financial resource strain: Not on file  . Food insecurity    Worry: Not on file    Inability: Not on file  . Transportation needs    Medical: Not on file    Non-medical: Not on file  Tobacco Use  . Smoking status: Former Smoker    Types: Cigars  . Smokeless tobacco: Never Used  . Tobacco comment: Never used Tobacco  Substance and Sexual Activity  .  Alcohol use: Yes    Alcohol/week: 0.0 standard drinks    Comment: 4 glasses wine a month  . Drug use: No  . Sexual activity: Never  Lifestyle  . Physical activity    Days per week: Not on file    Minutes per session: Not on file  . Stress: Not on file  Relationships  . Social Herbalist on phone: Not on file    Gets together: Not on file    Attends religious service: Not on file    Active member of club or organization: Not on file    Attends meetings of clubs or organizations: Not on file    Relationship status: Not on file  . Intimate partner violence    Fear of current or ex partner: Not on file    Emotionally abused: Not on file    Physically abused: Not on file    Forced sexual activity: Not on file  Other Topics Concern  . Not on file  Social History Narrative  . Not on file     PHYSICAL EXAM:  VS: BP 138/72   Pulse 61   Ht 5\' 9"  (1.753  m)   Wt 212 lb (96.2 kg)   BMI 31.31 kg/m  Physical Exam Gen: NAD, alert, cooperative with exam, well-appearing ENT: normal lips, normal nasal mucosa,  Eye: normal EOM, normal conjunctiva and lids CV:  no edema, +2 pedal pulses   Resp: no accessory muscle use, non-labored,   Skin: no rashes, no areas of induration  Neuro: normal tone, normal sensation to touch Psych:  normal insight, alert and oriented MSK:  Left shoulder: Normal active flexion abduction. Normal internal and external rotation. Normal strength resistance. Normal grip strength. Negative empty can testing. Neurovascular intact     ASSESSMENT & PLAN:   Primary osteoarthritis of left shoulder Doing much better since a glenohumeral injection. -Counseled on home exercise therapy and supportive care. -Can follow-up as needed.

## 2018-11-28 NOTE — Assessment & Plan Note (Signed)
Doing much better since a glenohumeral injection. -Counseled on home exercise therapy and supportive care. -Can follow-up as needed.

## 2018-11-29 ENCOUNTER — Encounter: Payer: Self-pay | Admitting: Internal Medicine

## 2018-11-29 ENCOUNTER — Encounter: Payer: Self-pay | Admitting: Family

## 2018-11-29 NOTE — Telephone Encounter (Signed)
Please review and advise how you would like to proceed

## 2018-11-30 NOTE — Telephone Encounter (Signed)
Dr. Kelton Pillar, please see pt's attached glucose readings. I will defer med adjustments to you. Thanks!

## 2018-12-01 DIAGNOSIS — R69 Illness, unspecified: Secondary | ICD-10-CM | POA: Diagnosis not present

## 2018-12-09 ENCOUNTER — Encounter: Payer: Self-pay | Admitting: Internal Medicine

## 2018-12-09 ENCOUNTER — Ambulatory Visit (INDEPENDENT_AMBULATORY_CARE_PROVIDER_SITE_OTHER): Payer: Medicare HMO | Admitting: Pharmacist

## 2018-12-09 ENCOUNTER — Other Ambulatory Visit: Payer: Self-pay

## 2018-12-09 ENCOUNTER — Encounter: Payer: Self-pay | Admitting: Family

## 2018-12-09 DIAGNOSIS — Z7901 Long term (current) use of anticoagulants: Secondary | ICD-10-CM | POA: Diagnosis not present

## 2018-12-09 DIAGNOSIS — I359 Nonrheumatic aortic valve disorder, unspecified: Secondary | ICD-10-CM

## 2018-12-09 DIAGNOSIS — Z952 Presence of prosthetic heart valve: Secondary | ICD-10-CM | POA: Diagnosis not present

## 2018-12-09 LAB — POCT INR: INR: 2.1 (ref 2.0–3.0)

## 2018-12-19 ENCOUNTER — Encounter: Payer: Self-pay | Admitting: Internal Medicine

## 2018-12-23 ENCOUNTER — Other Ambulatory Visit: Payer: Self-pay

## 2018-12-23 ENCOUNTER — Encounter: Payer: Self-pay | Admitting: Internal Medicine

## 2018-12-23 ENCOUNTER — Ambulatory Visit (INDEPENDENT_AMBULATORY_CARE_PROVIDER_SITE_OTHER): Payer: Medicare HMO | Admitting: Internal Medicine

## 2018-12-23 VITALS — BP 124/62 | HR 61 | Temp 97.8°F | Ht 69.0 in | Wt 211.6 lb

## 2018-12-23 DIAGNOSIS — E1122 Type 2 diabetes mellitus with diabetic chronic kidney disease: Secondary | ICD-10-CM

## 2018-12-23 DIAGNOSIS — N183 Chronic kidney disease, stage 3 unspecified: Secondary | ICD-10-CM | POA: Diagnosis not present

## 2018-12-23 DIAGNOSIS — E1142 Type 2 diabetes mellitus with diabetic polyneuropathy: Secondary | ICD-10-CM | POA: Diagnosis not present

## 2018-12-23 DIAGNOSIS — Z794 Long term (current) use of insulin: Secondary | ICD-10-CM

## 2018-12-23 LAB — POCT GLYCOSYLATED HEMOGLOBIN (HGB A1C): Hemoglobin A1C: 6.5 % — AB (ref 4.0–5.6)

## 2018-12-23 MED ORDER — BASAGLAR KWIKPEN 100 UNIT/ML ~~LOC~~ SOPN: 26.0000 [IU] | PEN_INJECTOR | Freq: Every day | SUBCUTANEOUS | 3 refills | Status: DC

## 2018-12-23 MED ORDER — TRULICITY 1.5 MG/0.5ML ~~LOC~~ SOAJ: 1.5000 mg | SUBCUTANEOUS | 3 refills | Status: DC

## 2018-12-23 NOTE — Progress Notes (Signed)
Name: [name]  Age/ Sex: 73 y.o., male   MRN/ DOB: [id number], [birth year]     PCP: [name], NP   Reason for Endocrinology Evaluation: Type 2 Diabetes Mellitus  Initial Endocrine Consultative Visit: 11/11/2018    PATIENT IDENTIFIER: [name] is a 73 y.o. male with a past medical history of T2DM, OSA, HTN, CAD ( S/P CABG) . The patient has followed with Endocrinology clinic since 11/11/2018 for consultative assistance with management of his diabetes.  DIABETIC HISTORY:  [name] was diagnosed with T2DM in 2005. He was on metformin which was stopped due to renal dysfunction . Has been on insulin since 2015His hemoglobin A1c has ranged from 6.3% in 2015, peaking at 8.3% in 2020.  On his initial visit to our clinic he was on basal insulin only. With an A1c of 8.3. Trulicity was added.   SUBJECTIVE:   During the last visit (11/11/2018): With an A1c of 8.3. Trulicity was added. Basal insulin was reduced.     Today (12/23/2018): [name] is here for a follow up on diabetes management.  He checks his blood sugars 2 times daily, preprandial to breakfast and bedtime. The patient has not had hypoglycemic episodes since the last clinic visit. Otherwise, the patient has not required any recent emergency interventions for hypoglycemia and has not had recent hospitalizations secondary to hyper or hypoglycemic episodes.    ROS: As per HPI and as detailed below: Review of Systems  Constitutional: Negative for chills and fever.  Respiratory: Negative for cough and wheezing.   Cardiovascular: Negative for chest pain and palpitations.  Gastrointestinal: Negative for diarrhea and nausea.      HOME DIABETES REGIMEN:  Basaglar 40 units daily  Trulicity [id number] mg once weekly     GLUCOSE LOG:  Fasting 89- 121 mg/dL  PM 127- 189 mg/dL    DIABETIC COMPLICATIONS: Microvascular complications:   CKDIII- Dr. Hollie Salk   Denies: neuropathy, retinopathy   Last  eye exam: Completed 03/2018  Macrovascular complications:   CAD (S/P CABG )   Denies: PVD, CVA   HISTORY:  Past Medical History:  Past Medical History:  Diagnosis Date  . Allergy   . Aortic stenosis   . CAD (coronary artery disease)    cabg  . Cancer [facility]) 2011   prostate  . Erectile dysfunction   . Heart murmur   . Hemochromatosis 02/14/2011  . Hemorrhoids   . History of gout   . History of hepatitis 1974  . Hyperlipidemia   . Hypertension   . Hypertr obst cardiomyop   . Obesity    moderate  . Sensorineural hearing loss   . Type II or unspecified type diabetes mellitus without mention of complication, not stated as uncontrolled    Past Surgical History:  Past Surgical History:  Procedure Laterality Date  . 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 APPENDECTOMY  1990  . CHOLECYSTECTOMY  1990  . CORONARY ARTERY BYPASS GRAFT  10/2003  . HERNIA REPAIR  1999   right, inguinal  . HERNIA REPAIR  2002   left, inguinal  . HIP SURGERY  2006   right hip  . PILONIDAL CYST EXCISION  1964  . prostate seed implant  3/12  . TONSILLECTOMY  childhood    Social History:  reports that he has quit smoking. His smoking use included cigars. He has never used smokeless tobacco. He reports current alcohol use. He reports that he does not  use drugs. Family History:  Family History  Problem Relation Age of Onset  . Heart disease Mother   . Stroke Mother   . Heart disease Father   . Asperger's syndrome Son   . Hyperlipidemia Son   . Coronary artery disease Brother   . Cancer Neg Hx        negative for colon cancer     HOME MEDICATIONS: Allergies as of 12/23/2018   No Known Allergies     Medication List       Accurate as of December 23, 2018  2:29 PM. If you have any questions, ask your nurse or doctor.        STOP taking these medications   Trulicity A999333 0000000 Sopn Generic drug: Dulaglutide Replaced by: Trulicity 1.5 0000000 Sopn Stopped by: Dorita Sciara, MD     TAKE these medications   aspirin EC 81 MG tablet Take 1 tablet (81 mg total) by mouth daily.   atorvastatin 20 MG tablet Commonly known as: LIPITOR TAKE 1 TABLET (20 MG TOTAL) BY MOUTH DAILY. OFFICE VISIT NEEDED   Basaglar KwikPen 100 UNIT/ML Sopn Inject 0.26 mLs (26 Units total) into the skin daily. What changed: how much to take Changed by: Dorita Sciara, MD   betamethasone dipropionate 0.05 % cream Apply topically 2 (two) times daily as needed. To eczema rash   betamethasone valerate ointment 0.1 % Commonly known as: VALISONE Apply 1 application topically 2 (two) times daily.   Blood Glucose Monitoring Suppl Supplies Misc Use for monitoring glucose level   colchicine 0.6 MG tablet Take 2 tabs by mouth now and then 1 tab in 1 hour for gout.   fenofibrate 145 MG tablet Commonly known as: TRICOR TAKE 1 TABLET BY MOUTH EVERY DAY   furosemide 20 MG tablet Commonly known as: LASIX TAKE 1 TABLET (20 MG TOTAL) BY MOUTH DAILY AS NEEDED. FOR SWELLING   imiquimod 5 % cream Commonly known as: ALDARA Apply topically 3 (three) times a week.   metoprolol succinate 25 MG 24 hr tablet Commonly known as: TOPROL-XL Take 1 tablet (25 mg total) by mouth daily.   NovoFine Plus 32G X 4 MM Misc Generic drug: Insulin Pen Needle USE FOR INJECTIONS INTO THE SKIN EVERY EVENING   olmesartan-hydrochlorothiazide 20-12.5 MG tablet Commonly known as: BENICAR HCT Take 0.5 tablets by mouth daily.   omega-3 acid ethyl esters 1 g capsule Commonly known as: LOVAZA TAKE 1 CAPSULE BY MOUTH TWICE A DAY   OneTouch Delica Lancets Fine Misc Use to check blood sugar twice a day.  Dx  E11.8   OneTouch Verio test strip Generic drug: glucose blood USE AS INSTRUCTED TO CHECK BLOOD SUGAR TWICE A DAY. DX E11.8   OVER THE COUNTER MEDICATION OLIVE OIL. Take 1 tablespoon (1480mg ) every afternoon   oxyCODONE-acetaminophen 5-325 MG tablet Commonly known as: PERCOCET/ROXICET  Take 1 tablet by mouth every 6 (six) hours as needed for severe pain.   Trulicity 1.5 0000000 Sopn Generic drug: Dulaglutide Inject 1.5 mg into the skin once a week. Replaces: Trulicity A999333 0000000 Sopn Started by: Dorita Sciara, MD   warfarin 7.5 MG tablet Commonly known as: COUMADIN Take as directed by the anticoagulation clinic. If you are unsure how to take this medication, talk to your nurse or doctor. Original instructions: TAKE 1/2 TO 1 TABLET DAILY AS DIRECTED BY COUMADIN CLINIC        OBJECTIVE:   Vital Signs: BP 124/62 (BP Location: Right Arm, Patient  Position: Sitting, Cuff Size: Large)   Pulse 61   Temp 97.8 F (36.6 C)   Ht 5\' 9"  (1.753 m)   Wt 211 lb 9.6 oz (96 kg)   SpO2 97%   BMI 31.25 kg/m   Wt Readings from Last 3 Encounters:  12/23/18 211 lb 9.6 oz (96 kg)  11/28/18 212 lb (96.2 kg)  11/11/18 215 lb 6.4 oz (97.7 kg)     Exam: General: Pt appears well and is in NAD  Lungs: Clear with good BS bilat with no rales, rhonchi, or wheezes  Heart: RRR with normal S1 and S2 and no gallops; no murmurs; no rub  Abdomen: Normoactive bowel sounds, soft, nontender, without masses or organomegaly palpable  Extremities: No pretibial edema.   Neuro: MS is good with appropriate affect, pt is alert and Ox3       DM foot exam: 11/11/2018  The skin of the feet is intact without sores or ulcerations. The pedal pulses are 2+ on right and 2+ on left. The sensation is decreased at the toes  to a screening 5.07, 10 gram monofilament bilaterally    DATA REVIEWED:  Lab Results  Component Value Date   HGBA1C 6.5 (A) 12/23/2018   HGBA1C 8.3 (H) 09/06/2018   HGBA1C 8.2 (H) 03/12/2018   Lab Results  Component Value Date   MICROALBUR 8.7 (H) 09/09/2014   LDLCALC 54 11/25/2010   CREATININE 1.81 (H) 10/17/2018   Lab Results  Component Value Date   MICRALBCREAT 5.0 09/09/2014     Lab Results  Component Value Date   CHOL 157 09/06/2018   HDL 25.20  (L) 09/06/2018   LDLCALC 54 11/25/2010   LDLDIRECT 83.0 09/06/2018   TRIG 331.0 (H) 09/06/2018   CHOLHDL 6 09/06/2018         ASSESSMENT / PLAN / RECOMMENDATIONS:   1) Type 2 Diabetes Mellitus, Optimally controlled, With CKD III , Neuropathic and Macrovascular complications - Most recent A1c of 6.5 %. Goal A1c < 7.5%.   - I have congratulated the patient on improved glucose control.  - He is tolerating the trulicity well but unfortunately its costing him $200 a month. He will check with his insurance on the formulary. In the meantime , I will send a prescription to mail pharmacy and see if he can get it at a better price.   MEDICATIONS:  Decrease Basaglar 26 units  Increase Trulicity to 1.5 mg weekly   EDUCATION / INSTRUCTIONS:  BG monitoring instructions: Patient is instructed to check his blood sugars 2 times a day, fasting and bedtime .  Call [facility] if: BG persistently < 70 or > 300. . I reviewed the Rule of 15 for the treatment of hypoglycemia in detail with the patient. Literature supplied.     F/U in 3 months    Signed electronically by: Mack Guise, MD  [facility]  [facility] [address] [place], [place] [address] Phone: [phone] FAX: [phone]   CC: [name], [address] [place] [address] Phone: [phone]  Fax: [phone]  Return to Endocrinology clinic as below: Future Appointments  Date Time Provider [place]  01/13/2019 11:30 AM [facility]  03/26/2019 10:30 AM Shamleffer, Melanie Crazier, MD [facility] None  04/10/2019  1:00 PM [facility] None  04/10/2019  1:30 PM Ennever, Rudell Cobb, MD [facility] None

## 2018-12-23 NOTE — Patient Instructions (Signed)
-   Your sugars have improved tremendously, keep up the good work  - Education officer, museum to 26 units daily  - Increase Trulicity to 1.5 mg weekly  - Let us know should you have vomiting or diarrhea      HOW TO TREAT LOW BLOOD SUGARS (Blood sugar LESS THAN 70 MG/DL)  Please follow the RULE OF 15 for the treatment of hypoglycemia treatment (when your (blood sugars are less than 70 mg/dL)    STEP 1: Take 15 grams of carbohydrates when your blood sugar is low, which includes:   3-4 GLUCOSE TABS  OR  3-4 OZ OF JUICE OR REGULAR SODA OR  ONE TUBE OF GLUCOSE GEL     STEP 2: RECHECK blood sugar in 15 MINUTES STEP 3: If your blood sugar is still low at the 15 minute recheck --> then, go back to STEP 1 and treat AGAIN with another 15 grams of carbohydrates.

## 2018-12-24 ENCOUNTER — Encounter: Payer: Self-pay | Admitting: Internal Medicine

## 2018-12-31 ENCOUNTER — Encounter: Payer: Self-pay | Admitting: Internal Medicine

## 2018-12-31 ENCOUNTER — Encounter: Payer: Self-pay | Admitting: Family

## 2019-01-01 ENCOUNTER — Encounter: Payer: Self-pay | Admitting: Family

## 2019-01-01 ENCOUNTER — Encounter: Payer: Self-pay | Admitting: Internal Medicine

## 2019-01-02 ENCOUNTER — Other Ambulatory Visit: Payer: Self-pay

## 2019-01-02 MED ORDER — TRULICITY 1.5 MG/0.5ML ~~LOC~~ SOAJ: 1.5000 mg | SUBCUTANEOUS | 3 refills | Status: DC

## 2019-01-05 ENCOUNTER — Other Ambulatory Visit: Payer: Self-pay | Admitting: Family

## 2019-01-05 ENCOUNTER — Other Ambulatory Visit: Payer: Self-pay | Admitting: Cardiology

## 2019-01-05 DIAGNOSIS — E1159 Type 2 diabetes mellitus with other circulatory complications: Secondary | ICD-10-CM

## 2019-01-05 DIAGNOSIS — R011 Cardiac murmur, unspecified: Secondary | ICD-10-CM

## 2019-01-05 DIAGNOSIS — I152 Hypertension secondary to endocrine disorders: Secondary | ICD-10-CM

## 2019-01-05 DIAGNOSIS — R69 Illness, unspecified: Secondary | ICD-10-CM | POA: Diagnosis not present

## 2019-01-10 ENCOUNTER — Encounter: Payer: Self-pay | Admitting: Internal Medicine

## 2019-01-12 ENCOUNTER — Encounter: Payer: Self-pay | Admitting: Internal Medicine

## 2019-01-12 ENCOUNTER — Encounter: Payer: Self-pay | Admitting: Family

## 2019-01-13 ENCOUNTER — Ambulatory Visit (INDEPENDENT_AMBULATORY_CARE_PROVIDER_SITE_OTHER): Payer: Medicare HMO | Admitting: Pharmacist

## 2019-01-13 ENCOUNTER — Other Ambulatory Visit: Payer: Self-pay

## 2019-01-13 DIAGNOSIS — Z952 Presence of prosthetic heart valve: Secondary | ICD-10-CM | POA: Diagnosis not present

## 2019-01-13 DIAGNOSIS — I359 Nonrheumatic aortic valve disorder, unspecified: Secondary | ICD-10-CM | POA: Diagnosis not present

## 2019-01-13 DIAGNOSIS — Z7901 Long term (current) use of anticoagulants: Secondary | ICD-10-CM

## 2019-01-13 LAB — POCT INR: INR: 2.5 (ref 2.0–3.0)

## 2019-01-14 ENCOUNTER — Ambulatory Visit (INDEPENDENT_AMBULATORY_CARE_PROVIDER_SITE_OTHER): Payer: Medicare HMO | Admitting: Family

## 2019-01-14 ENCOUNTER — Encounter: Payer: Self-pay | Admitting: Family

## 2019-01-14 ENCOUNTER — Other Ambulatory Visit: Payer: Self-pay

## 2019-01-14 VITALS — Temp 97.9°F | Resp 16 | Ht 67.0 in | Wt 212.0 lb

## 2019-01-14 DIAGNOSIS — IMO0002 Reserved for concepts with insufficient information to code with codable children: Secondary | ICD-10-CM

## 2019-01-14 DIAGNOSIS — E785 Hyperlipidemia, unspecified: Secondary | ICD-10-CM

## 2019-01-14 DIAGNOSIS — I10 Essential (primary) hypertension: Secondary | ICD-10-CM

## 2019-01-14 DIAGNOSIS — E1165 Type 2 diabetes mellitus with hyperglycemia: Secondary | ICD-10-CM | POA: Diagnosis not present

## 2019-01-14 DIAGNOSIS — E118 Type 2 diabetes mellitus with unspecified complications: Secondary | ICD-10-CM | POA: Diagnosis not present

## 2019-01-14 LAB — COMPREHENSIVE METABOLIC PANEL
ALT: 18 U/L (ref 0–53)
AST: 21 U/L (ref 0–37)
Albumin: 4.5 g/dL (ref 3.5–5.2)
Alkaline Phosphatase: 40 U/L (ref 39–117)
BUN: 45 mg/dL — ABNORMAL HIGH (ref 6–23)
CO2: 29 mEq/L (ref 19–32)
Calcium: 9.7 mg/dL (ref 8.4–10.5)
Chloride: 100 mEq/L (ref 96–112)
Creatinine, Ser: 1.62 mg/dL — ABNORMAL HIGH (ref 0.40–1.50)
GFR: 41.88 mL/min — ABNORMAL LOW (ref >60.00)
Glucose, Bld: 110 mg/dL — ABNORMAL HIGH (ref 70–99)
Potassium: 4.4 mEq/L (ref 3.5–5.1)
Sodium: 136 mEq/L (ref 135–145)
Total Bilirubin: 0.5 mg/dL (ref 0.2–1.2)
Total Protein: 7.6 g/dL (ref 6.0–8.3)

## 2019-01-14 LAB — LIPID PANEL
Cholesterol: 150 mg/dL (ref 0–200)
HDL: 32 mg/dL — ABNORMAL LOW (ref >39.00)
LDL Cholesterol: 80 mg/dL (ref 0–99)
NonHDL: 118.3
Total CHOL/HDL Ratio: 5
Triglycerides: 191 mg/dL — ABNORMAL HIGH (ref 0.0–149.0)
VLDL: 38.2 mg/dL (ref 0.0–40.0)

## 2019-01-14 MED ORDER — OLMESARTAN MEDOXOMIL 20 MG PO TABS: 10.0000 mg | ORAL_TABLET | Freq: Every day | ORAL | 1 refills | Status: DC

## 2019-01-14 NOTE — Patient Instructions (Signed)
Stop benicar hct. Start benicar 20mg  (1/2 tab once daily).

## 2019-01-14 NOTE — Progress Notes (Signed)
Subjective:    Patient ID: [name], male    DOB: [birth year], 73 y.o.   MRN: [id number]  HPI   Patient is a 73 year old male who presents today for follow-up.  DM2-  Lab Results  Component Value Date   HGBA1C 6.5 (A) 12/23/2018   HGBA1C 8.3 (H) 09/06/2018   HGBA1C 8.2 (H) 03/12/2018   Lab Results  Component Value Date   MICROALBUR 8.7 (H) 09/09/2014   LDLCALC 54 11/25/2010   CREATININE 1.81 (H) 10/17/2018   Hypertension-patient reports that if he gets up too fast he has some mild unsteadiness.   BP Readings from Last 3 Encounters:  01/14/19 (!) 125/59  12/23/18 124/62  11/28/18 138/72   Hyperlipidemia-continues Lipitor 20 mg.    Review of Systems   See HPI  Past Medical History:  Diagnosis Date  . Allergy   . Aortic stenosis   . CAD (coronary artery disease)    cabg  . Cancer [facility]) 2011   prostate  . Erectile dysfunction   . Heart murmur   . Hemochromatosis 02/14/2011  . Hemorrhoids   . History of gout   . History of hepatitis 1974  . Hyperlipidemia   . Hypertension   . Hypertr obst cardiomyop   . Obesity    moderate  . Sensorineural hearing loss   . Type II or unspecified type diabetes mellitus without mention of complication, not stated as uncontrolled      Social History   Socioeconomic History  . Marital status: Married    Spouse name: Not on file  . Number of children: Not on file  . Years of education: Not on file  . Highest education level: Not on file  Occupational History  . Not on file  Tobacco Use  . Smoking status: Former Smoker    Types: Cigars  . Smokeless tobacco: Never Used  . Tobacco comment: Never used Tobacco  Substance and Sexual Activity  . Alcohol use: Yes    Alcohol/week: 0.0 standard drinks    Comment: 4 glasses wine a month  . Drug use: No  . Sexual activity: Never  Other Topics Concern  . Not on file  Social History Narrative  . Not on file   Social Determinants of Health   Financial Resource  Strain:   . Difficulty of Paying Living Expenses: Not on file  Food Insecurity:   . Worried About Charity fundraiser in the Last Year: Not on file  . Ran Out of Food in the Last Year: Not on file  Transportation Needs:   . Lack of Transportation (Medical): Not on file  . Lack of Transportation (Non-Medical): Not on file  Physical Activity:   . Days of Exercise per Week: Not on file  . Minutes of Exercise per Session: Not on file  Stress:   . Feeling of Stress : Not on file  Social Connections:   . Frequency of Communication with Friends and Family: Not on file  . Frequency of Social Gatherings with Friends and Family: Not on file  . Attends Religious Services: Not on file  . Active Member of Clubs or Organizations: Not on file  . Attends Archivist Meetings: Not on file  . Marital Status: Not on file  Intimate Partner Violence:   . Fear of Current or Ex-Partner: Not on file  . Emotionally Abused: Not on file  . Physically Abused: Not on file  . Sexually Abused: Not on file  Past Surgical History:  Procedure Laterality Date  . 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 APPENDECTOMY  1990  . CHOLECYSTECTOMY  1990  . CORONARY ARTERY BYPASS GRAFT  10/2003  . HERNIA REPAIR  1999   right, inguinal  . HERNIA REPAIR  2002   left, inguinal  . HIP SURGERY  2006   right hip  . PILONIDAL CYST EXCISION  1964  . prostate seed implant  3/12  . TONSILLECTOMY  childhood    Family History  Problem Relation Age of Onset  . Heart disease Mother   . Stroke Mother   . Heart disease Father   . Asperger's syndrome Son   . Hyperlipidemia Son   . Coronary artery disease Brother   . Cancer Neg Hx        negative for colon cancer    No Known Allergies  Current Outpatient Medications on File Prior to Visit  Medication Sig Dispense Refill  . aspirin EC 81 MG tablet Take 1 tablet (81 mg total) by mouth daily. 90 tablet 3  . atorvastatin (LIPITOR) 20 MG tablet TAKE 1  TABLET (20 MG TOTAL) BY MOUTH DAILY. OFFICE VISIT NEEDED 30 tablet 1  . betamethasone dipropionate (DIPROLENE) 0.05 % cream Apply topically 2 (two) times daily as needed. To eczema rash 30 g 1  . betamethasone valerate ointment (VALISONE) 0.1 % Apply 1 application topically 2 (two) times daily. 30 g 0  . Blood Glucose Monitoring Suppl Supplies MISC Use for monitoring glucose level 100 each 1  . colchicine 0.6 MG tablet Take 2 tabs by mouth now and then 1 tab in 1 hour for gout. 3 tablet 0  . Dulaglutide (TRULICITY) 1.5 0000000 SOPN Inject 1.5 mg into the skin once a week. 12 pen 3  . fenofibrate (TRICOR) 145 MG tablet TAKE 1 TABLET BY MOUTH EVERY DAY 90 tablet 1  . furosemide (LASIX) 20 MG tablet TAKE 1 TABLET (20 MG TOTAL) BY MOUTH DAILY AS NEEDED. FOR SWELLING 90 tablet 1  . glucose blood (ONETOUCH VERIO) test strip USE AS INSTRUCTED TO CHECK BLOOD SUGAR TWICE A DAY. DX E11.8 100 each 4  . imiquimod (ALDARA) 5 % cream Apply topically 3 (three) times a week.    . Insulin Glargine (BASAGLAR KWIKPEN) 100 UNIT/ML SOPN Inject 0.26 mLs (26 Units total) into the skin daily. 24 mL 3  . metoprolol succinate (TOPROL-XL) 25 MG 24 hr tablet TAKE 1 TABLET BY MOUTH EVERY DAY 90 tablet 1  . NOVOFINE PLUS 32G X 4 MM MISC USE FOR INJECTIONS INTO THE SKIN EVERY EVENING 90 each 3  . olmesartan-hydrochlorothiazide (BENICAR HCT) 20-12.5 MG tablet Take 0.5 tablets by mouth daily. 45 tablet 1  . omega-3 acid ethyl esters (LOVAZA) 1 g capsule TAKE 1 CAPSULE BY MOUTH TWICE A DAY 90 capsule 2  . ONETOUCH DELICA LANCETS FINE MISC Use to check blood sugar twice a day.  Dx  E11.8 200 each 1  . OVER THE COUNTER MEDICATION OLIVE OIL. Take 1 tablespoon (1480mg ) every afternoon    . oxyCODONE-acetaminophen (PERCOCET/ROXICET) 5-325 MG tablet Take 1 tablet by mouth every 6 (six) hours as needed for severe pain. 10 tablet 0  . warfarin (COUMADIN) 7.5 MG tablet TAKE 1/2 TO 1 TABLET DAILY AS DIRECTED BY COUMADIN CLINIC 90 tablet 1   . [DISCONTINUED] fenofibrate (TRICOR) 145 MG tablet Take 1 tablet (145 mg total) by mouth daily. 90 tablet 1  . [DISCONTINUED] NOVOFINE PLUS 32G X  4 MM MISC USE FOR INJECTIONS INTO THE SKIN EVERY EVENING 100 each 3   No current facility-administered medications on file prior to visit.    BP (!) 125/59 (BP Location: Right Arm, Patient Position: Sitting, Cuff Size: Large)   Pulse 62   Temp 97.9 F (36.6 C) (Oral)   Resp 16   Ht 5\' 7"  (1.702 m)   Wt 212 lb (96.2 kg)   SpO2 100%   BMI 33.20 kg/m        Objective:   Physical Exam Constitutional:      General: He is not in acute distress.    Appearance: He is well-developed.  HENT:     Head: Normocephalic and atraumatic.  Cardiovascular:     Rate and Rhythm: Normal rate and regular rhythm.     Heart sounds: No murmur.  Pulmonary:     Effort: Pulmonary effort is normal. No respiratory distress.     Breath sounds: Normal breath sounds. No wheezing or rales.  Skin:    General: Skin is warm and dry.  Neurological:     Mental Status: He is alert and oriented to person, place, and time.  Psychiatric:        Behavior: Behavior normal.        Thought Content: Thought content normal.           Assessment & Plan:  HTN- Patient is noted to have some orthostatic hypotension.  I have advised him to discontinue his half tablet of Benicar HCT and instead switch to Benicar 20 mg half tab once daily.  He will continue to check his blood pressure daily and send me his readings via MyChart.  He is instructed to stand up slowly.  Diabetes type 2-Home sugars are stable.  Continue current meds regimen.  Management per endocrinology.  Hyperlipidemia-will obtain follow-up lipid panel.  This visit occurred during the SARS-CoV-2 public health emergency.  Safety protocols were in place, including screening questions prior to the visit, additional usage of staff PPE, and extensive cleaning of exam room while observing appropriate contact time as  indicated for disinfecting solutions.

## 2019-01-31 ENCOUNTER — Encounter: Payer: Self-pay | Admitting: Family

## 2019-01-31 ENCOUNTER — Encounter: Payer: Self-pay | Admitting: Internal Medicine

## 2019-02-04 DIAGNOSIS — R69 Illness, unspecified: Secondary | ICD-10-CM | POA: Diagnosis not present

## 2019-02-08 ENCOUNTER — Other Ambulatory Visit: Payer: Self-pay | Admitting: Cardiology

## 2019-02-09 ENCOUNTER — Encounter: Payer: Self-pay | Admitting: Family

## 2019-02-12 ENCOUNTER — Encounter: Payer: Self-pay | Admitting: Family Medicine

## 2019-02-13 ENCOUNTER — Other Ambulatory Visit: Payer: Self-pay | Admitting: Family

## 2019-02-21 ENCOUNTER — Other Ambulatory Visit: Payer: Self-pay | Admitting: Cardiology

## 2019-03-02 ENCOUNTER — Other Ambulatory Visit: Payer: Self-pay | Admitting: Family

## 2019-03-03 ENCOUNTER — Ambulatory Visit (INDEPENDENT_AMBULATORY_CARE_PROVIDER_SITE_OTHER): Payer: Medicare HMO | Admitting: Pharmacist Clinician (PhC)/ Clinical Pharmacy Specialist

## 2019-03-03 ENCOUNTER — Encounter: Payer: Self-pay | Admitting: Internal Medicine

## 2019-03-03 ENCOUNTER — Encounter: Payer: Self-pay | Admitting: Family

## 2019-03-03 ENCOUNTER — Other Ambulatory Visit: Payer: Self-pay

## 2019-03-03 DIAGNOSIS — I359 Nonrheumatic aortic valve disorder, unspecified: Secondary | ICD-10-CM

## 2019-03-03 DIAGNOSIS — Z7901 Long term (current) use of anticoagulants: Secondary | ICD-10-CM | POA: Diagnosis not present

## 2019-03-03 DIAGNOSIS — Z952 Presence of prosthetic heart valve: Secondary | ICD-10-CM

## 2019-03-03 LAB — POCT INR: INR: 2.1 (ref 2.0–3.0)

## 2019-03-04 ENCOUNTER — Encounter: Payer: Self-pay | Admitting: Family

## 2019-03-06 ENCOUNTER — Other Ambulatory Visit: Payer: Self-pay | Admitting: Cardiology

## 2019-03-22 DIAGNOSIS — R69 Illness, unspecified: Secondary | ICD-10-CM | POA: Diagnosis not present

## 2019-03-26 ENCOUNTER — Ambulatory Visit (INDEPENDENT_AMBULATORY_CARE_PROVIDER_SITE_OTHER): Payer: Medicare HMO | Admitting: Internal Medicine

## 2019-03-26 ENCOUNTER — Other Ambulatory Visit: Payer: Self-pay

## 2019-03-26 ENCOUNTER — Encounter: Payer: Self-pay | Admitting: Internal Medicine

## 2019-03-26 VITALS — BP 128/64 | HR 82 | Temp 98.2°F | Ht 67.0 in | Wt 216.0 lb

## 2019-03-26 DIAGNOSIS — E1142 Type 2 diabetes mellitus with diabetic polyneuropathy: Secondary | ICD-10-CM

## 2019-03-26 DIAGNOSIS — H2513 Age-related nuclear cataract, bilateral: Secondary | ICD-10-CM | POA: Diagnosis not present

## 2019-03-26 DIAGNOSIS — Z794 Long term (current) use of insulin: Secondary | ICD-10-CM

## 2019-03-26 DIAGNOSIS — E119 Type 2 diabetes mellitus without complications: Secondary | ICD-10-CM | POA: Diagnosis not present

## 2019-03-26 DIAGNOSIS — E1122 Type 2 diabetes mellitus with diabetic chronic kidney disease: Secondary | ICD-10-CM

## 2019-03-26 DIAGNOSIS — H0100A Unspecified blepharitis right eye, upper and lower eyelids: Secondary | ICD-10-CM | POA: Diagnosis not present

## 2019-03-26 DIAGNOSIS — N183 Chronic kidney disease, stage 3 unspecified: Secondary | ICD-10-CM

## 2019-03-26 DIAGNOSIS — G43809 Other migraine, not intractable, without status migrainosus: Secondary | ICD-10-CM | POA: Diagnosis not present

## 2019-03-26 LAB — POCT GLYCOSYLATED HEMOGLOBIN (HGB A1C): Hemoglobin A1C: 6.8 % — AB (ref 4.0–5.6)

## 2019-03-26 LAB — GLUCOSE, POCT (MANUAL RESULT ENTRY): POC Glucose: 156 mg/dl — AB (ref 70–99)

## 2019-03-26 MED ORDER — TRULICITY 1.5 MG/0.5ML ~~LOC~~ SOAJ: 1.5000 mg | SUBCUTANEOUS | 11 refills | Status: DC

## 2019-03-26 MED ORDER — BASAGLAR KWIKPEN 100 UNIT/ML ~~LOC~~ SOPN: 32.0000 [IU] | PEN_INJECTOR | Freq: Every day | SUBCUTANEOUS | 3 refills | Status: DC

## 2019-03-26 NOTE — Progress Notes (Signed)
Name: [name]  Age/ Sex: 74 y.o., male   MRN/ DOB: [id number], October 18, 1945     PCP: [name], NP   Reason for Endocrinology Evaluation: Type 2 Diabetes Mellitus  Initial Endocrine Consultative Visit: 11/11/2018    PATIENT IDENTIFIER: [name] is a 74 y.o. male with a past medical history of T2DM, OSA, HTN, CAD ( S/P CABG) . The patient has followed with Endocrinology clinic since 11/11/2018 for consultative assistance with management of his diabetes.  DIABETIC HISTORY:  Mr. Dittus was diagnosed with T2DM in 2005. He was on metformin which was stopped due to renal dysfunction . Has been on insulin since 2015His hemoglobin A1c has ranged from 6.3% in 2015, peaking at 8.3% in 2020.  On his initial visit to our clinic he was on basal insulin only. With an A1c of 8.3. Trulicity was added.   SUBJECTIVE:   During the last visit (12/23/2018): A1c of 6.5%.  We increased Trulicity and reduced basal insulin   Today (03/26/2019): [name] is here for a 84-month follow up on diabetes management.  He checks his blood sugars 2 times daily, preprandial to breakfast and bedtime. The patient has not had hypoglycemic episodes since the last clinic visit. Otherwise, the patient has not required any recent emergency interventions for hypoglycemia and has not had recent hospitalizations secondary to hyper or hypoglycemic episodes.    ROS: As per HPI and as detailed below: Review of Systems  Constitutional: Negative for chills and fever.  Respiratory: Negative for cough and wheezing.   Cardiovascular: Negative for chest pain and palpitations.  Gastrointestinal: Negative for diarrhea and nausea.      HOME DIABETES REGIMEN:  Basaglar 36 units daily  Trulicity 1.5 mg once weekly     GLUCOSE LOG:  BG 89- 205 mg/dL    DIABETIC COMPLICATIONS: Microvascular complications:   CKDIII- Dr. Hollie Salk   Denies: neuropathy, retinopathy   Last eye exam: Completed  03/26/2019  Macrovascular complications:   CAD (S/P CABG )   Denies: PVD, CVA   HISTORY:  Past Medical History:  Past Medical History:  Diagnosis Date  . Allergy   . Aortic stenosis   . CAD (coronary artery disease)    cabg  . Cancer [facility]) 2011   prostate  . Erectile dysfunction   . Heart murmur   . Hemochromatosis 02/14/2011  . Hemorrhoids   . History of gout   . History of hepatitis 1974  . Hyperlipidemia   . Hypertension   . Hypertr obst cardiomyop   . Obesity    moderate  . Sensorineural hearing loss   . Type II or unspecified type diabetes mellitus without mention of complication, not stated as uncontrolled    Past Surgical History:  Past Surgical History:  Procedure Laterality Date  . 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 APPENDECTOMY  1990  . CHOLECYSTECTOMY  1990  . CORONARY ARTERY BYPASS GRAFT  10/2003  . HERNIA REPAIR  1999   right, inguinal  . HERNIA REPAIR  2002   left, inguinal  . HIP SURGERY  2006   right hip  . PILONIDAL CYST EXCISION  1964  . prostate seed implant  3/12  . TONSILLECTOMY  childhood    Social History:  reports that he has quit smoking. His smoking use included cigars. He has never used smokeless tobacco. He reports current alcohol use. He reports that he does not use drugs. Family History:  Family History  Problem Relation Age of Onset  . Heart disease Mother   . Stroke Mother   . Heart disease Father   . Asperger's syndrome Son   . Hyperlipidemia Son   . Coronary artery disease Brother   . Cancer Neg Hx        negative for colon cancer     HOME MEDICATIONS: Allergies as of 03/26/2019   No Known Allergies     Medication List       Accurate as of March 26, 2019 12:04 PM. If you have any questions, ask your nurse or doctor.        aspirin EC 81 MG tablet Take 1 tablet (81 mg total) by mouth daily.   atorvastatin 20 MG tablet Commonly known as: LIPITOR TAKE 1 TABLET (20 MG TOTAL) BY MOUTH DAILY.  OFFICE VISIT NEEDED   Basaglar KwikPen 100 UNIT/ML Sopn Inject 0.32 mLs (32 Units total) into the skin daily. What changed: how much to take Changed by: Dorita Sciara, MD   betamethasone dipropionate 0.05 % cream Apply topically 2 (two) times daily as needed. To eczema rash   betamethasone valerate ointment 0.1 % Commonly known as: VALISONE Apply 1 application topically 2 (two) times daily.   Blood Glucose Monitoring Suppl Supplies Misc Use for monitoring glucose level   colchicine 0.6 MG tablet Take 2 tabs by mouth now and then 1 tab in 1 hour for gout.   fenofibrate 145 MG tablet Commonly known as: TRICOR TAKE 1 TABLET BY MOUTH EVERY DAY   furosemide 20 MG tablet Commonly known as: LASIX TAKE 1 TABLET (20 MG TOTAL) BY MOUTH DAILY AS NEEDED. FOR SWELLING   imiquimod 5 % cream Commonly known as: ALDARA Apply topically 3 (three) times a week.   metoprolol succinate 25 MG 24 hr tablet Commonly known as: TOPROL-XL TAKE 1 TABLET BY MOUTH EVERY DAY   olmesartan 20 MG tablet Commonly known as: BENICAR Take 0.5 tablets (10 mg total) by mouth daily.   olmesartan-hydrochlorothiazide 20-12.5 MG tablet Commonly known as: BENICAR HCT Take 0.5 tablets by mouth daily.   omega-3 acid ethyl esters 1 g capsule Commonly known as: LOVAZA TAKE 1 CAPSULE BY MOUTH TWICE A DAY   OneTouch Delica Lancets 99991111 Misc USE AS DIRECTED   OneTouch Verio test strip Generic drug: glucose blood USE AS INSTRUCTED TO CHECK BLOOD SUGAR TWICE A DAY. DX E11.8   OVER THE COUNTER MEDICATION OLIVE OIL. Take 1 tablespoon (1480mg ) every afternoon   oxyCODONE-acetaminophen 5-325 MG tablet Commonly known as: PERCOCET/ROXICET Take 1 tablet by mouth every 6 (six) hours as needed for severe pain.   Trulicity 1.5 0000000 Sopn Generic drug: Dulaglutide Inject 1.5 mg into the skin once a week.   warfarin 7.5 MG tablet Commonly known as: COUMADIN Take as directed by the anticoagulation clinic.  If you are unsure how to take this medication, talk to your nurse or doctor. Original instructions: TAKE 1/2 TO 1 TABLET DAILY AS DIRECTED BY COUMADIN CLINIC        OBJECTIVE:   Vital Signs: BP 128/64 (BP Location: Left Arm, Patient Position: Sitting, Cuff Size: Large)   Pulse 82   Temp 98.2 F (36.8 C)   Ht 5\' 7"  (1.702 m)   Wt 216 lb (98 kg)   SpO2 97%   BMI 33.83 kg/m   Wt Readings from Last 3 Encounters:  03/26/19 216 lb (98 kg)  01/14/19 212 lb (96.2 kg)  12/23/18 211 lb 9.6 oz (96 kg)  Exam: General: Pt appears well and is in NAD  Lungs: Clear with good BS bilat with no rales, rhonchi, or wheezes  Heart: RRR with normal S1 and S2 and no gallops; no murmurs; no rub  Extremities: No pretibial edema.   Neuro: MS is good with appropriate affect, pt is alert and Ox3       DM foot exam: 11/11/2018  The skin of the feet is intact without sores or ulcerations. The pedal pulses are 2+ on right and 2+ on left. The sensation is decreased at the toes  to a screening 5.07, 10 gram monofilament bilaterally    DATA REVIEWED:  Lab Results  Component Value Date   HGBA1C 6.8 (A) 03/26/2019   HGBA1C 6.5 (A) 12/23/2018   HGBA1C 8.3 (H) 09/06/2018   Lab Results  Component Value Date   MICROALBUR 8.7 (H) 09/09/2014   LDLCALC 80 01/14/2019   CREATININE 1.62 (H) 01/14/2019   Lab Results  Component Value Date   MICRALBCREAT 5.0 09/09/2014     Lab Results  Component Value Date   CHOL 150 01/14/2019   HDL 32.00 (L) 01/14/2019   LDLCALC 80 01/14/2019   LDLDIRECT 83.0 09/06/2018   TRIG 191.0 (H) 01/14/2019   CHOLHDL 5 01/14/2019         ASSESSMENT / PLAN / RECOMMENDATIONS:   1) Type 2 Diabetes Mellitus, Optimally controlled, With CKD III , Neuropathic and Macrovascular complications - Most recent A1c of 6.8 %. Goal A1c < 7.5%.   -He continues to do very well with glycemic control, he is tolerating Trulicity without side effects, based on his glucose log that  he brought today we are going to reduce his insulin dose as below -Patient would like to space out his follow-up visits   MEDICATIONS:  Decrease Basaglar 32 units   Trulicity 1.5 mg weekly   EDUCATION / INSTRUCTIONS:  BG monitoring instructions: Patient is instructed to check his blood sugars 2 times a day, fasting and bedtime .  Call [facility] if: BG persistently < 70 or > 300. . I reviewed the Rule of 15 for the treatment of hypoglycemia in detail with the patient. Literature supplied.     F/U in 6 months    Signed electronically by: Mack Guise, MD  [facility]  [address] [place], [place] [address] Phone: [phone] FAX: [phone]   CC: [name], [address] [place] [address] Phone: [phone]  Fax: [phone]  Return to Endocrinology clinic as below: Future Appointments  Date Time Provider [place]  04/10/2019  1:00 PM [facility] None  04/10/2019  1:30 PM Volanda Napoleon, MD [facility] None  04/14/2019 11:15 AM [facility]  09/24/2019 10:30 AM Teigen Bellin, Melanie Crazier, MD [facility] None

## 2019-03-26 NOTE — Patient Instructions (Signed)
-    Basaglar 32 units daily  - Trulicity to 1.5 mg weekly        HOW TO TREAT LOW BLOOD SUGARS (Blood sugar LESS THAN 70 MG/DL)  Please follow the RULE OF 15 for the treatment of hypoglycemia treatment (when your (blood sugars are less than 70 mg/dL)    STEP 1: Take 15 grams of carbohydrates when your blood sugar is low, which includes:   3-4 GLUCOSE TABS  OR  3-4 OZ OF JUICE OR REGULAR SODA OR  ONE TUBE OF GLUCOSE GEL     STEP 2: RECHECK blood sugar in 15 MINUTES STEP 3: If your blood sugar is still low at the 15 minute recheck --> then, go back to STEP 1 and treat AGAIN with another 15 grams of carbohydrates.

## 2019-03-27 ENCOUNTER — Encounter: Payer: Self-pay | Admitting: Family

## 2019-03-27 DIAGNOSIS — Z0100 Encounter for examination of eyes and vision without abnormal findings: Secondary | ICD-10-CM | POA: Diagnosis not present

## 2019-03-30 ENCOUNTER — Other Ambulatory Visit: Payer: Self-pay | Admitting: Cardiology

## 2019-03-31 ENCOUNTER — Encounter: Payer: Self-pay | Admitting: Family

## 2019-04-01 ENCOUNTER — Other Ambulatory Visit: Payer: Self-pay | Admitting: Cardiology

## 2019-04-01 DIAGNOSIS — L578 Other skin changes due to chronic exposure to nonionizing radiation: Secondary | ICD-10-CM | POA: Diagnosis not present

## 2019-04-01 DIAGNOSIS — L218 Other seborrheic dermatitis: Secondary | ICD-10-CM | POA: Diagnosis not present

## 2019-04-01 DIAGNOSIS — X32XXXS Exposure to sunlight, sequela: Secondary | ICD-10-CM | POA: Diagnosis not present

## 2019-04-01 DIAGNOSIS — L570 Actinic keratosis: Secondary | ICD-10-CM | POA: Diagnosis not present

## 2019-04-01 DIAGNOSIS — Z08 Encounter for follow-up examination after completed treatment for malignant neoplasm: Secondary | ICD-10-CM | POA: Diagnosis not present

## 2019-04-01 DIAGNOSIS — R011 Cardiac murmur, unspecified: Secondary | ICD-10-CM

## 2019-04-01 DIAGNOSIS — L821 Other seborrheic keratosis: Secondary | ICD-10-CM | POA: Diagnosis not present

## 2019-04-01 DIAGNOSIS — W908XXS Exposure to other nonionizing radiation, sequela: Secondary | ICD-10-CM | POA: Diagnosis not present

## 2019-04-01 DIAGNOSIS — L814 Other melanin hyperpigmentation: Secondary | ICD-10-CM | POA: Diagnosis not present

## 2019-04-01 DIAGNOSIS — Z85828 Personal history of other malignant neoplasm of skin: Secondary | ICD-10-CM | POA: Diagnosis not present

## 2019-04-10 ENCOUNTER — Inpatient Hospital Stay: Payer: Medicare HMO

## 2019-04-10 ENCOUNTER — Inpatient Hospital Stay: Payer: Medicare HMO | Attending: Hematology & Oncology | Admitting: Hematology & Oncology

## 2019-04-10 ENCOUNTER — Other Ambulatory Visit: Payer: Self-pay

## 2019-04-10 DIAGNOSIS — Z794 Long term (current) use of insulin: Secondary | ICD-10-CM | POA: Diagnosis not present

## 2019-04-10 DIAGNOSIS — Z7982 Long term (current) use of aspirin: Secondary | ICD-10-CM | POA: Insufficient documentation

## 2019-04-10 DIAGNOSIS — C61 Malignant neoplasm of prostate: Secondary | ICD-10-CM | POA: Diagnosis present

## 2019-04-10 DIAGNOSIS — E119 Type 2 diabetes mellitus without complications: Secondary | ICD-10-CM | POA: Diagnosis not present

## 2019-04-10 DIAGNOSIS — Z79899 Other long term (current) drug therapy: Secondary | ICD-10-CM | POA: Diagnosis not present

## 2019-04-10 DIAGNOSIS — Z923 Personal history of irradiation: Secondary | ICD-10-CM | POA: Diagnosis not present

## 2019-04-10 DIAGNOSIS — Z7901 Long term (current) use of anticoagulants: Secondary | ICD-10-CM | POA: Diagnosis not present

## 2019-04-10 LAB — CBC WITH DIFFERENTIAL (CANCER CENTER ONLY)
Abs Immature Granulocytes: 0.04 10*3/uL (ref 0.00–0.07)
Basophils Absolute: 0.1 10*3/uL (ref 0.0–0.1)
Basophils Relative: 1 %
Eosinophils Absolute: 0.2 10*3/uL (ref 0.0–0.5)
Eosinophils Relative: 2 %
HCT: 39.3 % (ref 39.0–52.0)
Hemoglobin: 13.4 g/dL (ref 13.0–17.0)
Immature Granulocytes: 1 %
Lymphocytes Relative: 22 %
Lymphs Abs: 1.8 10*3/uL (ref 0.7–4.0)
MCH: 30.6 pg (ref 26.0–34.0)
MCHC: 34.1 g/dL (ref 30.0–36.0)
MCV: 89.7 fL (ref 80.0–100.0)
Monocytes Absolute: 0.7 10*3/uL (ref 0.1–1.0)
Monocytes Relative: 8 %
Neutro Abs: 5.5 10*3/uL (ref 1.7–7.7)
Neutrophils Relative %: 66 %
Platelet Count: 248 10*3/uL (ref 150–400)
RBC: 4.38 MIL/uL (ref 4.22–5.81)
RDW: 12.7 % (ref 11.5–15.5)
WBC Count: 8.3 10*3/uL (ref 4.0–10.5)
nRBC: 0 % (ref 0.0–0.2)

## 2019-04-10 LAB — CMP (CANCER CENTER ONLY)
ALT: 20 U/L (ref 0–44)
AST: 25 U/L (ref 15–41)
Albumin: 4.6 g/dL (ref 3.5–5.0)
Alkaline Phosphatase: 34 U/L — ABNORMAL LOW (ref 38–126)
Anion gap: 9 (ref 5–15)
BUN: 38 mg/dL — ABNORMAL HIGH (ref 8–23)
CO2: 26 mmol/L (ref 22–32)
Calcium: 9.8 mg/dL (ref 8.9–10.3)
Chloride: 104 mmol/L (ref 98–111)
Creatinine: 1.69 mg/dL — ABNORMAL HIGH (ref 0.61–1.24)
GFR, Est AFR Am: 45 mL/min — ABNORMAL LOW (ref >60)
GFR, Estimated: 39 mL/min — ABNORMAL LOW (ref >60)
Glucose, Bld: 112 mg/dL — ABNORMAL HIGH (ref 70–99)
Potassium: 4.7 mmol/L (ref 3.5–5.1)
Sodium: 139 mmol/L (ref 135–145)
Total Bilirubin: 0.5 mg/dL (ref 0.3–1.2)
Total Protein: 7.5 g/dL (ref 6.5–8.1)

## 2019-04-10 NOTE — Progress Notes (Signed)
Hematology and Oncology Follow Up Visit  [name] [id number] [birth year] 75 y.o. 04/10/2019   Principle Diagnosis:  1. Hemochromatosis (H63D homozygous mutation). 2. Stage I prostate cancer, status post radiation therapy and     radiation seeds.  Current Therapy:    Phlebotomy to maintain a ferritin less than 100      Interim History:  [name] is back for followup.  He is doing quite well.  We see him every 6 months.  So far, he is doing quite well.  He has been managing the coronavirus pandemic pretty well.  He is already had one vaccination.  I think he gets the next 1 in a week or so.  He has had no problems with the hemochromatosis.  We saw him 6 months ago, his ferritin was I think 57 with iron saturation of 18%.  His main problem is the diabetes.  His diabetes will clearly dictate his health problems and his prognosis.  There is been no fever.  He has had no cough.  He has had no shortness of breath.  He has had no leg swelling.  Overall, his performance status is ECOG 0.     Medications:  Current Outpatient Medications:  .  aspirin EC 81 MG tablet, Take 1 tablet (81 mg total) by mouth daily., Disp: 90 tablet, Rfl: 3 .  atorvastatin (LIPITOR) 20 MG tablet, TAKE 1 TABLET (20 MG TOTAL) BY MOUTH DAILY. OFFICE VISIT NEEDED, Disp: 30 tablet, Rfl: 1 .  betamethasone dipropionate (DIPROLENE) 0.05 % cream, Apply topically 2 (two) times daily as needed. To eczema rash, Disp: 30 g, Rfl: 1 .  betamethasone valerate ointment (VALISONE) 0.1 %, Apply 1 application topically 2 (two) times daily., Disp: 30 g, Rfl: 0 .  Blood Glucose Monitoring Suppl Supplies MISC, Use for monitoring glucose level, Disp: 100 each, Rfl: 1 .  colchicine 0.6 MG tablet, Take 2 tabs by mouth now and then 1 tab in 1 hour for gout., Disp: 3 tablet, Rfl: 0 .  Dulaglutide (TRULICITY) 1.5 0000000 SOPN, Inject 1.5 mg into the skin once a week., Disp: 4 pen, Rfl: 11 .  fenofibrate (TRICOR) 145 MG tablet,  TAKE 1 TABLET BY MOUTH EVERY DAY, Disp: 90 tablet, Rfl: 1 .  furosemide (LASIX) 20 MG tablet, TAKE 1 TABLET (20 MG TOTAL) BY MOUTH DAILY AS NEEDED. FOR SWELLING, Disp: 90 tablet, Rfl: 1 .  imiquimod (ALDARA) 5 % cream, Apply topically 3 (three) times a week., Disp: , Rfl:  .  Insulin Glargine (BASAGLAR KWIKPEN) 100 UNIT/ML SOPN, Inject 0.32 mLs (32 Units total) into the skin daily., Disp: 15 mL, Rfl: 3 .  metoprolol succinate (TOPROL-XL) 25 MG 24 hr tablet, TAKE 1 TABLET BY MOUTH EVERY DAY, Disp: 90 tablet, Rfl: 1 .  olmesartan (BENICAR) 20 MG tablet, Take 0.5 tablets (10 mg total) by mouth daily., Disp: 90 tablet, Rfl: 1 .  olmesartan-hydrochlorothiazide (BENICAR HCT) 20-12.5 MG tablet, Take 0.5 tablets by mouth daily., Disp: 45 tablet, Rfl: 1 .  omega-3 acid ethyl esters (LOVAZA) 1 g capsule, TAKE 1 CAPSULE BY MOUTH TWICE A DAY, Disp: 90 capsule, Rfl: 2 .  OneTouch Delica Lancets 99991111 MISC, USE AS DIRECTED, Disp: 100 each, Rfl: 1 .  ONETOUCH VERIO test strip, USE AS INSTRUCTED TO CHECK BLOOD SUGAR TWICE A DAY. DX E11.8, Disp: 100 strip, Rfl: 4 .  OVER THE COUNTER MEDICATION, OLIVE OIL. Take 1 tablespoon (1480mg ) every afternoon, Disp: , Rfl:  .  oxyCODONE-acetaminophen (PERCOCET/ROXICET) 5-325  MG tablet, Take 1 tablet by mouth every 6 (six) hours as needed for severe pain., Disp: 10 tablet, Rfl: 0 .  warfarin (COUMADIN) 7.5 MG tablet, TAKE 1/2 TO 1 TABLET DAILY AS DIRECTED BY COUMADIN CLINIC, Disp: 90 tablet, Rfl: 1  Allergies: No Known Allergies  Past Medical History, Surgical history, Social history, and Family History were reviewed and updated.  Review of Systems: Review of Systems  Constitutional: Negative.   HENT: Negative.   Eyes: Negative.   Respiratory: Negative.   Cardiovascular: Negative.   Gastrointestinal: Negative.   Genitourinary: Negative.   Musculoskeletal: Negative.   Skin: Negative.   Neurological: Negative.   Endo/Heme/Allergies: Negative.   Psychiatric/Behavioral:  Negative.      Physical Exam:  weight is 215 lb (97.5 kg). His temporal temperature is 97.3 F (36.3 C) (abnormal). His blood pressure is 138/73 and his pulse is 64. His respiration is 118 (abnormal) and oxygen saturation is 98%.   Physical Exam Vitals reviewed.  HENT:     Head: Normocephalic and atraumatic.  Eyes:     Pupils: Pupils are equal, round, and reactive to light.  Cardiovascular:     Rate and Rhythm: Normal rate and regular rhythm.     Heart sounds: Normal heart sounds.  Pulmonary:     Effort: Pulmonary effort is normal.     Breath sounds: Normal breath sounds.  Abdominal:     General: Bowel sounds are normal.     Palpations: Abdomen is soft.  Musculoskeletal:        General: No tenderness or deformity. Normal range of motion.     Cervical back: Normal range of motion.  Lymphadenopathy:     Cervical: No cervical adenopathy.  Skin:    General: Skin is warm and dry.     Findings: No erythema or rash.  Neurological:     Mental Status: He is alert and oriented to person, place, and time.  Psychiatric:        Behavior: Behavior normal.        Thought Content: Thought content normal.        Judgment: Judgment normal.     Lab Results  Component Value Date   WBC 8.3 04/10/2019   HGB 13.4 04/10/2019   HCT 39.3 04/10/2019   MCV 89.7 04/10/2019   PLT 248 04/10/2019     Chemistry      Component Value Date/Time   NA 139 04/10/2019 1300   NA 133 (L) 03/30/2016 1507   NA 138 09/23/2015 1020   K 4.7 04/10/2019 1300   K 4.3 03/30/2016 1507   K 4.4 09/23/2015 1020   CL 104 04/10/2019 1300   CL 99 03/30/2016 1507   CO2 26 04/10/2019 1300   CO2 25 03/30/2016 1507   CO2 25 09/23/2015 1020   BUN 38 (H) 04/10/2019 1300   BUN 31 (H) 03/30/2016 1507   BUN 28.4 (H) 09/23/2015 1020   CREATININE 1.69 (H) 04/10/2019 1300   CREATININE 1.30 (H) 03/30/2016 1507   CREATININE 1.3 09/23/2015 1020      Component Value Date/Time   CALCIUM 9.8 04/10/2019 1300   CALCIUM  10.1 03/30/2016 1507   CALCIUM 9.5 09/23/2015 1020   ALKPHOS 34 (L) 04/10/2019 1300   ALKPHOS 45 03/30/2016 1507   ALKPHOS 46 09/23/2015 1020   AST 25 04/10/2019 1300   AST 41 (H) 09/23/2015 1020   ALT 20 04/10/2019 1300   ALT 29 09/23/2015 1020   BILITOT 0.5 04/10/2019 1300  BILITOT 0.46 09/23/2015 1020      Impression and Plan: [name] is a 69- gentleman with hemochromatosis. He is homozygous for the H63D mutation. He is doing quite well.  We will see what his iron studies show.  I would have to believe that they will still be low enough that he will not need to be phlebotomized.  We will see him back in 6 more months.  Again, it will be his diabetes that will dictate his overall prognosis.  Volanda Napoleon, MD 3/11/20211:41 PM

## 2019-04-11 ENCOUNTER — Other Ambulatory Visit: Payer: Self-pay | Admitting: Family

## 2019-04-11 ENCOUNTER — Telehealth: Payer: Self-pay | Admitting: *Deleted

## 2019-04-11 LAB — IRON AND TIBC
Iron: 114 ug/dL (ref 42–163)
Saturation Ratios: 23 % (ref 20–55)
TIBC: 508 ug/dL — ABNORMAL HIGH (ref 202–409)
UIBC: 393 ug/dL — ABNORMAL HIGH (ref 117–376)

## 2019-04-11 LAB — FERRITIN: Ferritin: 79 ng/mL (ref 24–336)

## 2019-04-11 NOTE — Telephone Encounter (Signed)
Notified pt of lab results. Pt verbalized understanding. 

## 2019-04-11 NOTE — Telephone Encounter (Signed)
-----   Message from Volanda Napoleon, MD sent at 04/11/2019 11:08 AM EST ----- Call - the iron level is still ok!!  The ferritin is 79.  As long as it is below 100, no need for a phlebotomy1!!  Pete

## 2019-04-14 ENCOUNTER — Other Ambulatory Visit: Payer: Self-pay

## 2019-04-14 ENCOUNTER — Ambulatory Visit (INDEPENDENT_AMBULATORY_CARE_PROVIDER_SITE_OTHER): Payer: Medicare HMO | Admitting: Pharmacist

## 2019-04-14 DIAGNOSIS — Z7901 Long term (current) use of anticoagulants: Secondary | ICD-10-CM | POA: Diagnosis not present

## 2019-04-14 DIAGNOSIS — I359 Nonrheumatic aortic valve disorder, unspecified: Secondary | ICD-10-CM

## 2019-04-14 DIAGNOSIS — Z952 Presence of prosthetic heart valve: Secondary | ICD-10-CM | POA: Diagnosis not present

## 2019-04-14 LAB — POCT INR: INR: 2.1 (ref 2.0–3.0)

## 2019-04-15 ENCOUNTER — Other Ambulatory Visit: Payer: Self-pay

## 2019-04-16 ENCOUNTER — Encounter: Payer: Self-pay | Admitting: Family

## 2019-04-16 ENCOUNTER — Telehealth: Payer: Self-pay | Admitting: Family

## 2019-04-16 ENCOUNTER — Other Ambulatory Visit: Payer: Self-pay

## 2019-04-16 ENCOUNTER — Ambulatory Visit (INDEPENDENT_AMBULATORY_CARE_PROVIDER_SITE_OTHER): Payer: Medicare HMO | Admitting: Family

## 2019-04-16 VITALS — BP 124/63 | HR 64 | Temp 98.6°F | Resp 16 | Wt 215.0 lb

## 2019-04-16 DIAGNOSIS — E1165 Type 2 diabetes mellitus with hyperglycemia: Secondary | ICD-10-CM

## 2019-04-16 DIAGNOSIS — N189 Chronic kidney disease, unspecified: Secondary | ICD-10-CM

## 2019-04-16 DIAGNOSIS — E785 Hyperlipidemia, unspecified: Secondary | ICD-10-CM

## 2019-04-16 DIAGNOSIS — M1A9XX0 Chronic gout, unspecified, without tophus (tophi): Secondary | ICD-10-CM | POA: Diagnosis not present

## 2019-04-16 DIAGNOSIS — Z794 Long term (current) use of insulin: Secondary | ICD-10-CM | POA: Diagnosis not present

## 2019-04-16 DIAGNOSIS — I10 Essential (primary) hypertension: Secondary | ICD-10-CM

## 2019-04-16 NOTE — Progress Notes (Signed)
Subjective:    Patient ID: [name], male    DOB: 21-Sep-1945, 74 y.o.   MRN: [id number]  HPI   Patient is a 74 yr old male who presents today for follow up.  DM2-  Lab Results  Component Value Date   HGBA1C 6.8 (A) 03/26/2019   HGBA1C 6.5 (A) 12/23/2018   HGBA1C 8.3 (H) 09/06/2018   Lab Results  Component Value Date   MICROALBUR 8.7 (H) 09/09/2014   LDLCALC 80 01/14/2019   CREATININE 1.69 (H) 04/10/2019   HTN- maintained on benicar.  BP Readings from Last 3 Encounters:  04/16/19 124/63  04/10/19 138/73  03/26/19 128/64   Gout- uses colchicine prn. No recent flares   Hyperlipidemia- maintained on atorvastatin.  Lab Results  Component Value Date   CHOL 150 01/14/2019   HDL 32.00 (L) 01/14/2019   LDLCALC 80 01/14/2019   LDLDIRECT 83.0 09/06/2018   TRIG 191.0 (H) 01/14/2019   CHOLHDL 5 01/14/2019      Review of Systems See HPI  Past Medical History:  Diagnosis Date  . Allergy   . Aortic stenosis   . CAD (coronary artery disease)    cabg  . Cancer [facility]) 2011   prostate  . Erectile dysfunction   . Heart murmur   . Hemochromatosis 02/14/2011  . Hemorrhoids   . History of gout   . History of hepatitis 1974  . Hyperlipidemia   . Hypertension   . Hypertr obst cardiomyop   . Obesity    moderate  . Sensorineural hearing loss   . Type II or unspecified type diabetes mellitus without mention of complication, not stated as uncontrolled      Social History   Socioeconomic History  . Marital status: Married    Spouse name: Not on file  . Number of children: Not on file  . Years of education: Not on file  . Highest education level: Not on file  Occupational History  . Not on file  Tobacco Use  . Smoking status: Former Smoker    Types: Cigars  . Smokeless tobacco: Never Used  . Tobacco comment: Never used Tobacco  Substance and Sexual Activity  . Alcohol use: Yes    Alcohol/week: 0.0 standard drinks    Comment: 4 glasses wine a month  .  Drug use: No  . Sexual activity: Never  Other Topics Concern  . Not on file  Social History Narrative  . Not on file   Social Determinants of Health   Financial Resource Strain:   . Difficulty of Paying Living Expenses:   Food Insecurity:   . Worried About Charity fundraiser in the Last Year:   . Arboriculturist in the Last Year:   Transportation Needs:   . Film/video editor (Medical):   Marland Kitchen Lack of Transportation (Non-Medical):   Physical Activity:   . Days of Exercise per Week:   . Minutes of Exercise per Session:   Stress:   . Feeling of Stress :   Social Connections:   . Frequency of Communication with Friends and Family:   . Frequency of Social Gatherings with Friends and Family:   . Attends Religious Services:   . Active Member of Clubs or Organizations:   . Attends Archivist Meetings:   Marland Kitchen Marital Status:   Intimate Partner Violence:   . Fear of Current or Ex-Partner:   . Emotionally Abused:   Marland Kitchen Physically Abused:   . Sexually Abused:  Past Surgical History:  Procedure Laterality Date  . 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 APPENDECTOMY  1990  . CHOLECYSTECTOMY  1990  . CORONARY ARTERY BYPASS GRAFT  10/2003  . HERNIA REPAIR  1999   right, inguinal  . HERNIA REPAIR  2002   left, inguinal  . HIP SURGERY  2006   right hip  . PILONIDAL CYST EXCISION  1964  . prostate seed implant  3/12  . TONSILLECTOMY  childhood    Family History  Problem Relation Age of Onset  . Heart disease Mother   . Stroke Mother   . Heart disease Father   . Asperger's syndrome Son   . Hyperlipidemia Son   . Coronary artery disease Brother   . Cancer Neg Hx        negative for colon cancer    No Known Allergies  Current Outpatient Medications on File Prior to Visit  Medication Sig Dispense Refill  . aspirin EC 81 MG tablet Take 1 tablet (81 mg total) by mouth daily. 90 tablet 3  . atorvastatin (LIPITOR) 20 MG tablet TAKE 1 TABLET (20 MG TOTAL)  BY MOUTH DAILY. OFFICE VISIT NEEDED 30 tablet 1  . betamethasone dipropionate (DIPROLENE) 0.05 % cream Apply topically 2 (two) times daily as needed. To eczema rash 30 g 1  . betamethasone valerate ointment (VALISONE) 0.1 % Apply 1 application topically 2 (two) times daily. 30 g 0  . Blood Glucose Monitoring Suppl Supplies MISC Use for monitoring glucose level 100 each 1  . colchicine 0.6 MG tablet Take 2 tabs by mouth now and then 1 tab in 1 hour for gout. 3 tablet 0  . Dulaglutide (TRULICITY) 1.5 0000000 SOPN Inject 1.5 mg into the skin once a week. 4 pen 11  . fenofibrate (TRICOR) 145 MG tablet TAKE 1 TABLET BY MOUTH EVERY DAY 90 tablet 1  . furosemide (LASIX) 20 MG tablet TAKE 1 TABLET (20 MG TOTAL) BY MOUTH DAILY AS NEEDED. FOR SWELLING 90 tablet 0  . imiquimod (ALDARA) 5 % cream Apply topically 3 (three) times a week.    . Insulin Glargine (BASAGLAR KWIKPEN) 100 UNIT/ML SOPN Inject 0.32 mLs (32 Units total) into the skin daily. 15 mL 3  . metoprolol succinate (TOPROL-XL) 25 MG 24 hr tablet TAKE 1 TABLET BY MOUTH EVERY DAY 90 tablet 1  . olmesartan (BENICAR) 20 MG tablet Take 0.5 tablets (10 mg total) by mouth daily. 90 tablet 1  . olmesartan-hydrochlorothiazide (BENICAR HCT) 20-12.5 MG tablet Take 0.5 tablets by mouth daily. 45 tablet 1  . omega-3 acid ethyl esters (LOVAZA) 1 g capsule TAKE 1 CAPSULE BY MOUTH TWICE A DAY 90 capsule 2  . OneTouch Delica Lancets 99991111 MISC USE AS DIRECTED 100 each 1  . ONETOUCH VERIO test strip USE AS INSTRUCTED TO CHECK BLOOD SUGAR TWICE A DAY. DX E11.8 100 strip 4  . OVER THE COUNTER MEDICATION OLIVE OIL. Take 1 tablespoon (1480mg ) every afternoon    . warfarin (COUMADIN) 7.5 MG tablet TAKE 1/2 TO 1 TABLET DAILY AS DIRECTED BY COUMADIN CLINIC 90 tablet 1  . [DISCONTINUED] fenofibrate (TRICOR) 145 MG tablet Take 1 tablet (145 mg total) by mouth daily. 90 tablet 1  . [DISCONTINUED] NOVOFINE PLUS 32G X 4 MM MISC USE FOR INJECTIONS INTO THE SKIN EVERY EVENING 100  each 3   No current facility-administered medications on file prior to visit.    BP 124/63 (BP Location: Right Arm, Patient Position:  Sitting, Cuff Size: Small)   Pulse 64   Temp 98.6 F (37 C) (Oral)   Resp 16   Wt 215 lb (97.5 kg)   SpO2 99%   BMI 33.67 kg/m       Objective:   Physical Exam Constitutional:      Appearance: Normal appearance.  HENT:     Head: Normocephalic and atraumatic.  Cardiovascular:     Heart sounds: S1 normal and S2 normal.     Comments: Heart click noted from mechanical valve. Pulmonary:     Breath sounds: Normal breath sounds and air entry.  Musculoskeletal:     Right lower leg: 3+ Edema present.     Left lower leg: 3+ Edema present.  Skin:    General: Skin is warm.  Neurological:     Mental Status: He is alert.           Assessment & Plan:  HTN- bp stable on current medication. Continue same.  DM2- A1C at goal- being followed by endo.  Hyperlipidemia- tolerating statin. LDL at goal. Continue same.   Gout- stable. No recent flares. Has colchicine on hand for prn use.   CKD- plan referral to nephology- Cr. Stable. See mychart message to pt.   This visit occurred during the SARS-CoV-2 public health emergency.  Safety protocols were in place, including screening questions prior to the visit, additional usage of staff PPE, and extensive cleaning of exam room while observing appropriate contact time as indicated for disinfecting solutions.

## 2019-04-16 NOTE — Telephone Encounter (Signed)
See mychart.  

## 2019-04-22 DIAGNOSIS — Z0101 Encounter for examination of eyes and vision with abnormal findings: Secondary | ICD-10-CM | POA: Diagnosis not present

## 2019-05-09 ENCOUNTER — Other Ambulatory Visit: Payer: Self-pay | Admitting: Cardiology

## 2019-05-16 ENCOUNTER — Other Ambulatory Visit: Payer: Self-pay | Admitting: Family

## 2019-05-18 ENCOUNTER — Other Ambulatory Visit: Payer: Self-pay | Admitting: Cardiology

## 2019-05-18 DIAGNOSIS — R011 Cardiac murmur, unspecified: Secondary | ICD-10-CM

## 2019-05-19 ENCOUNTER — Encounter: Payer: Self-pay | Admitting: Family

## 2019-05-28 ENCOUNTER — Other Ambulatory Visit: Payer: Self-pay

## 2019-05-28 ENCOUNTER — Ambulatory Visit (INDEPENDENT_AMBULATORY_CARE_PROVIDER_SITE_OTHER): Payer: Medicare HMO | Admitting: Pharmacist Clinician (PhC)/ Clinical Pharmacy Specialist

## 2019-05-28 DIAGNOSIS — Z7901 Long term (current) use of anticoagulants: Secondary | ICD-10-CM

## 2019-05-28 DIAGNOSIS — I359 Nonrheumatic aortic valve disorder, unspecified: Secondary | ICD-10-CM | POA: Diagnosis not present

## 2019-05-28 DIAGNOSIS — Z952 Presence of prosthetic heart valve: Secondary | ICD-10-CM

## 2019-05-28 LAB — POCT INR: INR: 2 (ref 2.0–3.0)

## 2019-06-03 DIAGNOSIS — J810 Acute pulmonary edema: Secondary | ICD-10-CM | POA: Diagnosis not present

## 2019-06-03 DIAGNOSIS — R6521 Severe sepsis with septic shock: Secondary | ICD-10-CM | POA: Diagnosis not present

## 2019-06-03 DIAGNOSIS — R69 Illness, unspecified: Secondary | ICD-10-CM | POA: Diagnosis not present

## 2019-06-07 ENCOUNTER — Other Ambulatory Visit: Payer: Self-pay | Admitting: Internal Medicine

## 2019-06-14 DIAGNOSIS — R69 Illness, unspecified: Secondary | ICD-10-CM | POA: Diagnosis not present

## 2019-06-29 ENCOUNTER — Other Ambulatory Visit: Payer: Self-pay | Admitting: Family

## 2019-06-29 DIAGNOSIS — I152 Hypertension secondary to endocrine disorders: Secondary | ICD-10-CM

## 2019-06-29 DIAGNOSIS — E1159 Type 2 diabetes mellitus with other circulatory complications: Secondary | ICD-10-CM

## 2019-07-08 ENCOUNTER — Encounter: Payer: Self-pay | Admitting: Internal Medicine

## 2019-07-08 ENCOUNTER — Other Ambulatory Visit: Payer: Self-pay | Admitting: Family

## 2019-07-09 ENCOUNTER — Other Ambulatory Visit: Payer: Self-pay

## 2019-07-09 ENCOUNTER — Ambulatory Visit (INDEPENDENT_AMBULATORY_CARE_PROVIDER_SITE_OTHER): Payer: Medicare HMO | Admitting: Pharmacist Clinician (PhC)/ Clinical Pharmacy Specialist

## 2019-07-09 DIAGNOSIS — Z7901 Long term (current) use of anticoagulants: Secondary | ICD-10-CM

## 2019-07-09 DIAGNOSIS — I359 Nonrheumatic aortic valve disorder, unspecified: Secondary | ICD-10-CM

## 2019-07-09 DIAGNOSIS — Z952 Presence of prosthetic heart valve: Secondary | ICD-10-CM | POA: Diagnosis not present

## 2019-07-09 LAB — POCT INR: INR: 2.2 (ref 2.0–3.0)

## 2019-07-31 DIAGNOSIS — R69 Illness, unspecified: Secondary | ICD-10-CM | POA: Diagnosis not present

## 2019-08-18 ENCOUNTER — Other Ambulatory Visit: Payer: Self-pay | Admitting: Cardiology

## 2019-08-19 DIAGNOSIS — R69 Illness, unspecified: Secondary | ICD-10-CM | POA: Diagnosis not present

## 2019-08-20 ENCOUNTER — Other Ambulatory Visit: Payer: Self-pay

## 2019-08-20 ENCOUNTER — Ambulatory Visit (INDEPENDENT_AMBULATORY_CARE_PROVIDER_SITE_OTHER): Payer: Medicare HMO

## 2019-08-20 DIAGNOSIS — Z952 Presence of prosthetic heart valve: Secondary | ICD-10-CM

## 2019-08-20 DIAGNOSIS — I359 Nonrheumatic aortic valve disorder, unspecified: Secondary | ICD-10-CM

## 2019-08-20 DIAGNOSIS — Z5181 Encounter for therapeutic drug level monitoring: Secondary | ICD-10-CM | POA: Diagnosis not present

## 2019-08-20 DIAGNOSIS — Z7901 Long term (current) use of anticoagulants: Secondary | ICD-10-CM

## 2019-08-20 LAB — POCT INR: INR: 2.7 (ref 2.0–3.0)

## 2019-08-20 NOTE — Patient Instructions (Signed)
Continue with 1 tablet each Monday, Wednesday and Friday, 1/2 tablet all other days.  Repeat INR in 6 weeks  

## 2019-08-28 ENCOUNTER — Telehealth: Payer: Self-pay | Admitting: Family

## 2019-08-28 DIAGNOSIS — L84 Corns and callosities: Secondary | ICD-10-CM

## 2019-08-28 NOTE — Telephone Encounter (Signed)
Pt requesting referral to podiatry for corn on foot.

## 2019-09-03 ENCOUNTER — Encounter: Payer: Self-pay | Admitting: Family

## 2019-09-15 ENCOUNTER — Encounter: Payer: Self-pay | Admitting: Family

## 2019-09-22 ENCOUNTER — Other Ambulatory Visit: Payer: Self-pay

## 2019-09-22 ENCOUNTER — Ambulatory Visit (INDEPENDENT_AMBULATORY_CARE_PROVIDER_SITE_OTHER): Payer: Medicare HMO | Admitting: Family

## 2019-09-22 ENCOUNTER — Telehealth: Payer: Self-pay | Admitting: Family

## 2019-09-22 VITALS — BP 118/66 | HR 59 | Temp 98.0°F | Resp 16 | Ht 69.5 in | Wt 215.0 lb

## 2019-09-22 DIAGNOSIS — E1169 Type 2 diabetes mellitus with other specified complication: Secondary | ICD-10-CM | POA: Diagnosis not present

## 2019-09-22 DIAGNOSIS — E118 Type 2 diabetes mellitus with unspecified complications: Secondary | ICD-10-CM | POA: Diagnosis not present

## 2019-09-22 DIAGNOSIS — N183 Chronic kidney disease, stage 3 unspecified: Secondary | ICD-10-CM

## 2019-09-22 DIAGNOSIS — E1122 Type 2 diabetes mellitus with diabetic chronic kidney disease: Secondary | ICD-10-CM | POA: Diagnosis not present

## 2019-09-22 DIAGNOSIS — I2510 Atherosclerotic heart disease of native coronary artery without angina pectoris: Secondary | ICD-10-CM | POA: Diagnosis not present

## 2019-09-22 DIAGNOSIS — Z952 Presence of prosthetic heart valve: Secondary | ICD-10-CM | POA: Diagnosis not present

## 2019-09-22 DIAGNOSIS — I10 Essential (primary) hypertension: Secondary | ICD-10-CM | POA: Diagnosis not present

## 2019-09-22 DIAGNOSIS — Z8546 Personal history of malignant neoplasm of prostate: Secondary | ICD-10-CM | POA: Diagnosis not present

## 2019-09-22 DIAGNOSIS — Z794 Long term (current) use of insulin: Secondary | ICD-10-CM | POA: Diagnosis not present

## 2019-09-22 DIAGNOSIS — E785 Hyperlipidemia, unspecified: Secondary | ICD-10-CM

## 2019-09-22 DIAGNOSIS — E1165 Type 2 diabetes mellitus with hyperglycemia: Secondary | ICD-10-CM

## 2019-09-22 DIAGNOSIS — IMO0002 Reserved for concepts with insufficient information to code with codable children: Secondary | ICD-10-CM

## 2019-09-22 MED ORDER — OLMESARTAN MEDOXOMIL 5 MG PO TABS
5.0000 mg | ORAL_TABLET | Freq: Every day | ORAL | 5 refills | Status: DC
Start: 2019-09-22 — End: 2019-12-03

## 2019-09-22 NOTE — Telephone Encounter (Signed)
See mychart.  

## 2019-09-22 NOTE — Progress Notes (Signed)
Subjective:    Patient ID: [name], male    DOB: 1945-08-20, 74 y.o.   MRN: [id number]  HPI  Patient is a 74 yr old male who presents today for follow up.  HTN- maintained on toprol xl, olmesartan, notes that he has had some mild dizziness.  Reports that he "just feels light."  Feels worse if he stands quickly.    BP Readings from Last 3 Encounters:  09/22/19 118/66  04/16/19 124/63  04/10/19 138/73   DM2- maintained on trulicity/basaglar- following with endocrinology. Lab Results  Component Value Date   HGBA1C 6.8 (A) 03/26/2019   HGBA1C 6.5 (A) 12/23/2018   HGBA1C 8.3 (H) 09/06/2018   Lab Results  Component Value Date   MICROALBUR 8.7 (H) 09/09/2014   LDLCALC 80 01/14/2019   CREATININE 1.69 (H) 04/10/2019   Hyperlipidemia-  Lab Results  Component Value Date   CHOL 150 01/14/2019   HDL 32.00 (L) 01/14/2019   LDLCALC 80 01/14/2019   LDLDIRECT 83.0 09/06/2018   TRIG 191.0 (H) 01/14/2019   CHOLHDL 5 01/14/2019   CAD/St Jude Aortic valve- continues to follow at the coumadin clinic.    Seeing Nephrology this week.  Dr. Karlton Lemon.    Prostate cancer- Continues to follow with urology for annual psa/follow up. He sees Dr. Diona Fanti.    Review of Systems See HPI Past Medical History:  Diagnosis Date   Allergy    Aortic stenosis    CAD (coronary artery disease)    cabg   Cancer ([place]) 2011   prostate   Erectile dysfunction    Heart murmur    Hemochromatosis 02/14/2011   Hemorrhoids    History of gout    History of hepatitis 1974   Hyperlipidemia    Hypertension    Hypertr obst cardiomyop    Obesity    moderate   Sensorineural hearing loss    Type II or unspecified type diabetes mellitus without mention of complication, not stated as uncontrolled      Social History   Socioeconomic History   Marital status: Married    Spouse name: Not on file   Number of children: Not on file   Years of education: Not on file   Highest  education level: Not on file  Occupational History   Not on file  Tobacco Use   Smoking status: Former Smoker    Types: Cigars   Smokeless tobacco: Never Used   Tobacco comment: Never used Tobacco  Substance and Sexual Activity   Alcohol use: Yes    Alcohol/week: 0.0 standard drinks    Comment: 4 glasses wine a month   Drug use: No   Sexual activity: Never  Other Topics Concern   Not on file  Social History Narrative   Not on file   Social Determinants of Health   Financial Resource Strain:    Difficulty of Paying Living Expenses: Not on file  Food Insecurity:    Worried About [place] in the Last Year: Not on file   Ran Out of Food in the Last Year: Not on file  Transportation Needs:    Lack of Transportation (Medical): Not on file   Lack of Transportation (Non-Medical): Not on file  Physical Activity:    Days of Exercise per Week: Not on file   Minutes of Exercise per Session: Not on file  Stress:    Feeling of Stress : Not on file  Social Connections:    Frequency of Communication  with Friends and Family: Not on file   Frequency of Social Gatherings with Friends and Family: Not on file   Attends Religious Services: Not on file   Active Member of Clubs or Organizations: Not on file   Attends Archivist Meetings: Not on file   Marital Status: Not on file  Intimate Partner Violence:    Fear of Current or Ex-Partner: Not on file   Emotionally Abused: Not on file   Physically Abused: Not on file   Sexually Abused: Not on file    Past Surgical History:  Procedure Laterality Date   AORTIC VALVE REPLACEMENT     St Jude [place] GRAFT  10/2003   [place]   right, inguinal   HERNIA REPAIR  2002   left, inguinal   HIP SURGERY  2006   right hip   PILONIDAL CYST EXCISION  1964   prostate seed implant  3/12   TONSILLECTOMY   childhood    Family History  Problem Relation Age of Onset   Heart disease Mother    Stroke Mother    Heart disease Father    Asperger's syndrome Son    Hyperlipidemia Son    Coronary artery disease Brother    Cancer Neg Hx        negative for colon cancer    No Known Allergies  Current Outpatient Medications on File Prior to Visit  Medication Sig Dispense Refill   aspirin EC 81 MG tablet Take 1 tablet (81 mg total) by mouth daily. 90 tablet 3   atorvastatin (LIPITOR) 20 MG tablet TAKE 1 TABLET (20 MG TOTAL) BY MOUTH DAILY. OFFICE VISIT NEEDED 90 tablet 1   betamethasone dipropionate (DIPROLENE) 0.05 % cream Apply topically 2 (two) times daily as needed. To eczema rash 30 g 1   Blood Glucose Monitoring Suppl Supplies MISC Use for monitoring glucose level 100 each 1   colchicine 0.6 MG tablet Take 2 tabs by mouth now and then 1 tab in 1 hour for gout. 3 tablet 0   Dulaglutide (TRULICITY) 1.5 UV/2.5DG SOPN Inject 1.5 mg into the skin once a week. 4 pen 11   fenofibrate (TRICOR) 145 MG tablet TAKE 1 TABLET BY MOUTH EVERY DAY 90 tablet 1   furosemide (LASIX) 20 MG tablet TAKE 1 TABLET (20 MG TOTAL) BY MOUTH DAILY AS NEEDED. FOR SWELLING 90 tablet 0   imiquimod (ALDARA) 5 % cream Apply topically 3 (three) times a week.     Insulin Glargine (BASAGLAR KWIKPEN) 100 UNIT/ML INJECT 0.46 MLS (46 UNITS TOTAL) INTO THE SKIN DAILY. 15 mL 1   metoprolol succinate (TOPROL-XL) 25 MG 24 hr tablet TAKE 1 TABLET BY MOUTH EVERY DAY 90 tablet 1   olmesartan (BENICAR) 20 MG tablet Take 0.5 tablets (10 mg total) by mouth daily. 90 tablet 1   omega-3 acid ethyl esters (LOVAZA) 1 g capsule TAKE 1 CAPSULE BY MOUTH TWICE A DAY 180 capsule 1   OneTouch Delica Lancets 64Q MISC USE AS DIRECTED 100 each 1   ONETOUCH VERIO test strip USE AS INSTRUCTED TO CHECK BLOOD SUGAR TWICE A DAY. DX E11.8 100 strip 4   OVER THE COUNTER MEDICATION OLIVE OIL. Take 1 tablespoon (1480mg ) every afternoon       warfarin (COUMADIN) 7.5 MG tablet TAKE 1/2 TO 1 TABLET DAILY AS DIRECTED BY COUMADIN CLINIC 90 tablet 1   [DISCONTINUED] fenofibrate (  TRICOR) 145 MG tablet Take 1 tablet (145 mg total) by mouth daily. 90 tablet 1   [DISCONTINUED] NOVOFINE PLUS 32G X 4 MM MISC USE FOR INJECTIONS INTO THE SKIN EVERY EVENING 100 each 3   No current facility-administered medications on file prior to visit.    BP 118/66 (BP Location: Right Arm, Patient Position: Sitting, Cuff Size: Large)    Pulse (!) 59    Temp 98 F (36.7 C) (Oral)    Resp 16    Ht 5' 9.5" (1.765 m)    Wt 215 lb (97.5 kg)    SpO2 100%    BMI 31.29 kg/m       Objective:   Physical Exam Constitutional:      General: He is not in acute distress.    Appearance: He is well-developed.  HENT:     Head: Normocephalic and atraumatic.  Cardiovascular:     Rate and Rhythm: Normal rate and regular rhythm.     Heart sounds: No murmur (+ prosthetic valve click noted) heard.   Pulmonary:     Effort: Pulmonary effort is normal. No respiratory distress.     Breath sounds: Normal breath sounds. No wheezing or rales.  Musculoskeletal:        General: Swelling (2+ bilateral LE Edema) present.  Skin:    General: Skin is warm and dry.  Neurological:     Mental Status: He is alert and oriented to person, place, and time.  Psychiatric:        Behavior: Behavior normal.        Thought Content: Thought content normal.           Assessment & Plan:  HTN- appears a bit overtreated on benicar 10mg . Some of his home readings were as low as Systolic of 546. He will send me his readings via mychart in 1 week.  Hyperlipidemia- maintained on fenofibrate and lipitor. Fasting today. Obtain follow up lipid panel.  Hx of Prostate CA- continues to follow annually with urology.  DM2- A1C at goal. Management per endocrinology.  CAD/AVR- continues to follow up at the coumadin clinic and plans to schedule follow up with his primary cardiologist.    CKD-  has follow up with nephrology this week.  This visit occurred during the SARS-CoV-2 public health emergency.  Safety protocols were in place, including screening questions prior to the visit, additional usage of staff PPE, and extensive cleaning of exam room while observing appropriate contact time as indicated for disinfecting solutions.

## 2019-09-22 NOTE — Addendum Note (Signed)
Addended by: Kelle Darting A on: 09/22/2019 11:19 AM   Modules accepted: Orders

## 2019-09-22 NOTE — Patient Instructions (Addendum)
Please decrease olmesartan to 5mg  once daily. Continue to check your blood pressure once daily. Send me your readings with an update on your light headedness in 1-2 weeks.

## 2019-09-23 ENCOUNTER — Ambulatory Visit: Payer: Medicare HMO | Admitting: Podiatrist

## 2019-09-23 DIAGNOSIS — E1142 Type 2 diabetes mellitus with diabetic polyneuropathy: Secondary | ICD-10-CM | POA: Diagnosis not present

## 2019-09-23 DIAGNOSIS — M216X1 Other acquired deformities of right foot: Secondary | ICD-10-CM | POA: Diagnosis not present

## 2019-09-23 DIAGNOSIS — Z794 Long term (current) use of insulin: Secondary | ICD-10-CM | POA: Diagnosis not present

## 2019-09-23 DIAGNOSIS — L84 Corns and callosities: Secondary | ICD-10-CM | POA: Diagnosis not present

## 2019-09-23 LAB — BASIC METABOLIC PANEL
BUN/Creatinine Ratio: 23 (calc) — ABNORMAL HIGH (ref 6–22)
BUN: 38 mg/dL — ABNORMAL HIGH (ref 7–25)
CO2: 28 mmol/L (ref 20–32)
Calcium: 9.9 mg/dL (ref 8.6–10.3)
Chloride: 101 mmol/L (ref 98–110)
Creat: 1.63 mg/dL — ABNORMAL HIGH (ref 0.70–1.18)
Glucose, Bld: 134 mg/dL — ABNORMAL HIGH (ref 65–99)
Potassium: 5.1 mmol/L (ref 3.5–5.3)
Sodium: 137 mmol/L (ref 135–146)

## 2019-09-23 LAB — LIPID PANEL
Cholesterol: 172 mg/dL (ref <200)
HDL: 26 mg/dL — ABNORMAL LOW (ref >=40)
LDL Cholesterol (Calc): 99 mg/dL (calc)
Non-HDL Cholesterol (Calc): 146 mg/dL (calc) — ABNORMAL HIGH (ref <130)
Total CHOL/HDL Ratio: 6.6 (calc) — ABNORMAL HIGH (ref <5.0)
Triglycerides: 355 mg/dL — ABNORMAL HIGH (ref <150)

## 2019-09-23 NOTE — Progress Notes (Signed)
Name: [name]  Age/ Sex: 74 y.o., male   MRN/ DOB: [id number], 1946-01-21     PCP: [name], NP   Reason for Endocrinology Evaluation: Type 2 Diabetes Mellitus  Initial Endocrine Consultative Visit: 11/11/2018    PATIENT IDENTIFIER: [name] is a 74 y.o. male with a past medical history of T2DM, OSA, HTN, CAD ( S/P CABG) . The patient has followed with Endocrinology clinic since 11/11/2018 for consultative assistance with management of his diabetes.  DIABETIC HISTORY:  [name] was diagnosed with T2DM in 2005. He was on metformin which was stopped due to renal dysfunction . Has been on insulin since 2015His hemoglobin A1c has ranged from 6.3% in 2015, peaking at 8.3% in 2020.  On his initial visit to our clinic he was on basal insulin only. With an A1c of 8.3. Trulicity was added.   SUBJECTIVE:   During the last visit (03/26/2019): A1c of 6.8%.  We continued Trulicity and reduced basal insulin   Today (09/24/2019): [name] is here for a 44-month follow up on diabetes management.  He checks his blood sugars 2 times daily, preprandial to breakfast and bedtime. The patient has not had hypoglycemic episodes since the last clinic visit. hypoglycemic episodes.    Pt noted with hyperglycemia during the months of June and July due to dietary indiscretions.     HOME DIABETES REGIMEN:  Basaglar 32 units daily  Trulicity 1.5 mg once weekly    GLUCOSE LOG:  Did not bring    DIABETIC COMPLICATIONS: Microvascular complications:   CKDIII- Dr. Hollie Salk   Denies: neuropathy, retinopathy   Last eye exam: Completed 03/26/2019  Macrovascular complications:   CAD (S/P CABG )   Denies: PVD, CVA   HISTORY:  Past Medical History:  Past Medical History:  Diagnosis Date  . Allergy   . Aortic stenosis   . CAD (coronary artery disease)    cabg  . Cancer [facility]) 2011   prostate  . Erectile dysfunction   . Heart murmur   . Hemochromatosis  02/14/2011  . Hemorrhoids   . History of gout   . History of hepatitis 1974  . Hyperlipidemia   . Hypertension   . Hypertr obst cardiomyop   . Obesity    moderate  . Sensorineural hearing loss   . Type II or unspecified type diabetes mellitus without mention of complication, not stated as uncontrolled    Past Surgical History:  Past Surgical History:  Procedure Laterality Date  . 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 APPENDECTOMY  1990  . CHOLECYSTECTOMY  1990  . CORONARY ARTERY BYPASS GRAFT  10/2003  . HERNIA REPAIR  1999   right, inguinal  . HERNIA REPAIR  2002   left, inguinal  . HIP SURGERY  2006   right hip  . PILONIDAL CYST EXCISION  1964  . prostate seed implant  3/12  . TONSILLECTOMY  childhood    Social History:  reports that he has quit smoking. His smoking use included cigars. He has never used smokeless tobacco. He reports current alcohol use. He reports that he does not use drugs. Family History:  Family History  Problem Relation Age of Onset  . Heart disease Mother   . Stroke Mother   . Heart disease Father   . Asperger's syndrome Son   . Hyperlipidemia Son   . Coronary artery disease Brother   . Cancer Neg Hx  negative for colon cancer     HOME MEDICATIONS: Allergies as of 09/24/2019   No Known Allergies     Medication List       Accurate as of September 24, 2019 10:34 AM. If you have any questions, ask your nurse or doctor.        aspirin EC 81 MG tablet Take 1 tablet (81 mg total) by mouth daily.   atorvastatin 20 MG tablet Commonly known as: LIPITOR TAKE 1 TABLET (20 MG TOTAL) BY MOUTH DAILY. OFFICE VISIT NEEDED   Basaglar KwikPen 100 UNIT/ML INJECT 0.46 MLS (46 UNITS TOTAL) INTO THE SKIN DAILY. What changed: See the new instructions.   betamethasone dipropionate 0.05 % cream Apply topically 2 (two) times daily as needed. To eczema rash   Blood Glucose Monitoring Suppl Supplies Misc Use for monitoring glucose level    colchicine 0.6 MG tablet Take 2 tabs by mouth now and then 1 tab in 1 hour for gout.   fenofibrate 145 MG tablet Commonly known as: TRICOR TAKE 1 TABLET BY MOUTH EVERY DAY   furosemide 20 MG tablet Commonly known as: LASIX TAKE 1 TABLET (20 MG TOTAL) BY MOUTH DAILY AS NEEDED. FOR SWELLING   imiquimod 5 % cream Commonly known as: ALDARA Apply topically 3 (three) times a week.   metoprolol succinate 25 MG 24 hr tablet Commonly known as: TOPROL-XL TAKE 1 TABLET BY MOUTH EVERY DAY   olmesartan 5 MG tablet Commonly known as: Benicar Take 1 tablet (5 mg total) by mouth daily.   omega-3 acid ethyl esters 1 g capsule Commonly known as: LOVAZA TAKE 1 CAPSULE BY MOUTH TWICE A DAY   OneTouch Delica Lancets 38S Misc USE AS DIRECTED   OneTouch Verio test strip Generic drug: glucose blood USE AS INSTRUCTED TO CHECK BLOOD SUGAR TWICE A DAY. DX E11.8   OVER THE COUNTER MEDICATION OLIVE OIL. Take 1 tablespoon (1480mg ) every afternoon   Trulicity 1.5 NK/5.3ZJ Sopn Generic drug: Dulaglutide Inject 1.5 mg into the skin once a week.   warfarin 7.5 MG tablet Commonly known as: COUMADIN Take as directed by the anticoagulation clinic. If you are unsure how to take this medication, talk to your nurse or doctor. Original instructions: TAKE 1/2 TO 1 TABLET DAILY AS DIRECTED BY COUMADIN CLINIC        OBJECTIVE:   Vital Signs: BP 116/60 (BP Location: Left Arm, Patient Position: Sitting, Cuff Size: Normal)   Pulse 66   Ht 5' 9.5" (1.765 m)   Wt 216 lb 9.6 oz (98.2 kg)   SpO2 94%   BMI 31.53 kg/m   Wt Readings from Last 3 Encounters:  09/24/19 216 lb 9.6 oz (98.2 kg)  09/22/19 215 lb (97.5 kg)  04/16/19 215 lb (97.5 kg)     Exam: General: Pt appears well and is in NAD  Lungs: Clear with good BS bilat with no rales, rhonchi, or wheezes  Heart: RRR with normal S1 and S2 and no gallops; no murmurs; no rub  Extremities: Trace pretibial edema.   Neuro: MS is good with appropriate  affect, pt is alert and Ox3       DM foot exam: Pt states he had a food exam through podiatry 09/23/2019     DATA REVIEWED:  Lab Results  Component Value Date   HGBA1C 6.8 (A) 03/26/2019   HGBA1C 6.5 (A) 12/23/2018   HGBA1C 8.3 (H) 09/06/2018   Lab Results  Component Value Date   MICROALBUR 8.7 (H) 09/09/2014  [place] 99 09/22/2019   CREATININE 1.63 (H) 09/22/2019   Lab Results  Component Value Date   MICRALBCREAT 5.0 09/09/2014     Lab Results  Component Value Date   CHOL 172 09/22/2019   HDL 26 (L) 09/22/2019   LDLCALC 99 09/22/2019   LDLDIRECT 83.0 09/06/2018   TRIG 355 (H) 09/22/2019   CHOLHDL 6.6 (H) 09/22/2019       In Office BG 199 mg/dL   ASSESSMENT / PLAN / RECOMMENDATIONS:   1) Type 2 Diabetes Mellitus, Sub-Optimally  controlled, With CKD III , Neuropathic and Macrovascular complications - Most recent A1c of 7.9 %. Goal A1c < 7.5%.  - Hyperglycemia due to dietary indiscretions , pt is motivated to improve his diet - Will increase his insulin as below     MEDICATIONS:  Increase Basaglar 34 units   Continue Trulicity 1.5 mg weekly   EDUCATION / INSTRUCTIONS:  BG monitoring instructions: Patient is instructed to check his blood sugars 2 times a day, fasting and bedtime .  Call [facility] if: BG persistently < 70 . I reviewed the Rule of 15 for the treatment of hypoglycemia in detail with the patient. Literature supplied.     F/U in 3 months    Signed electronically by: Mack Guise, MD  [facility]  [address] [place], [place] [address] Phone: [phone] FAX: [phone]   CC: [name], [address] [place] [address] Phone: [phone]  Fax: [phone]  Return to Endocrinology clinic as below: Future Appointments  Date Time Provider [place]  10/01/2019 11:30 AM [facility] [place]  11/19/2019 10:00 AM Lelon Perla, MD [facility] None  03/24/2020 10:00 AM [name], NP [facility]

## 2019-09-24 ENCOUNTER — Encounter: Payer: Self-pay | Admitting: Internal Medicine

## 2019-09-24 ENCOUNTER — Ambulatory Visit (INDEPENDENT_AMBULATORY_CARE_PROVIDER_SITE_OTHER): Payer: Medicare HMO | Admitting: Internal Medicine

## 2019-09-24 ENCOUNTER — Other Ambulatory Visit: Payer: Self-pay

## 2019-09-24 ENCOUNTER — Encounter: Payer: Self-pay | Admitting: Podiatrist

## 2019-09-24 VITALS — BP 116/60 | HR 66 | Ht 69.5 in | Wt 216.6 lb

## 2019-09-24 DIAGNOSIS — N183 Chronic kidney disease, stage 3 unspecified: Secondary | ICD-10-CM | POA: Diagnosis not present

## 2019-09-24 DIAGNOSIS — E1122 Type 2 diabetes mellitus with diabetic chronic kidney disease: Secondary | ICD-10-CM | POA: Diagnosis not present

## 2019-09-24 DIAGNOSIS — E1165 Type 2 diabetes mellitus with hyperglycemia: Secondary | ICD-10-CM

## 2019-09-24 DIAGNOSIS — E1142 Type 2 diabetes mellitus with diabetic polyneuropathy: Secondary | ICD-10-CM | POA: Diagnosis not present

## 2019-09-24 DIAGNOSIS — E118 Type 2 diabetes mellitus with unspecified complications: Secondary | ICD-10-CM

## 2019-09-24 DIAGNOSIS — E1159 Type 2 diabetes mellitus with other circulatory complications: Secondary | ICD-10-CM

## 2019-09-24 DIAGNOSIS — Z794 Long term (current) use of insulin: Secondary | ICD-10-CM | POA: Diagnosis not present

## 2019-09-24 DIAGNOSIS — IMO0002 Reserved for concepts with insufficient information to code with codable children: Secondary | ICD-10-CM

## 2019-09-24 LAB — POCT GLYCOSYLATED HEMOGLOBIN (HGB A1C): Hemoglobin A1C: 7.9 % — AB (ref 4.0–5.6)

## 2019-09-24 LAB — POCT GLUCOSE (DEVICE FOR HOME USE): POC Glucose: 199 mg/dl — AB (ref 70–99)

## 2019-09-24 MED ORDER — TRULICITY 1.5 MG/0.5ML ~~LOC~~ SOAJ
1.5000 mg | SUBCUTANEOUS | 11 refills | Status: DC
Start: 2019-09-24 — End: 2020-02-03

## 2019-09-24 MED ORDER — BASAGLAR KWIKPEN 100 UNIT/ML ~~LOC~~ SOPN
34.0000 [IU] | PEN_INJECTOR | Freq: Every day | SUBCUTANEOUS | 3 refills | Status: DC
Start: 2019-09-24 — End: 2020-02-03

## 2019-09-24 NOTE — Patient Instructions (Addendum)
-    Increase Basaglar to 34 units daily  - Continue Trulicity at 1.5 mg weekly   Please Bring your Meter on next visit      HOW TO TREAT LOW BLOOD SUGARS (Blood sugar LESS THAN 70 MG/DL)  Please follow the RULE OF 15 for the treatment of hypoglycemia treatment (when your (blood sugars are less than 70 mg/dL)    STEP 1: Take 15 grams of carbohydrates when your blood sugar is low, which includes:   3-4 GLUCOSE TABS  OR  3-4 OZ OF JUICE OR REGULAR SODA OR  ONE TUBE OF GLUCOSE GEL     STEP 2: RECHECK blood sugar in 15 MINUTES STEP 3: If your blood sugar is still low at the 15 minute recheck --> then, go back to STEP 1 and treat AGAIN with another 15 grams of carbohydrates.

## 2019-09-24 NOTE — Progress Notes (Addendum)
Chief Complaint  Patient presents with  . Callouses    Painful corn per pt - R plantar forefoot submet 5.   . Diabetes    Most recent HgbA1c per pt = 6.7-6.9.      HPI: Patient is 74 y.o. male who presents today a painful corn submetatarsal 5 of the right foot.  He relates no trauma or injury to the foot.   Patient Active Problem List   Diagnosis Date Noted  . Type 2 diabetes mellitus with hyperglycemia, with long-term current use of insulin ([place]) 11/11/2018  . Type 2 diabetes mellitus with diabetic polyneuropathy, with long-term current use of insulin ([place]) 11/11/2018  . Type 2 diabetes mellitus with stage 3 chronic kidney disease, with long-term current use of insulin ([place]) 11/11/2018  . Diabetes mellitus ([place]) 11/11/2018  . Hx of CABG 11/05/2018  . Dyslipidemia 11/05/2018  . Primary osteoarthritis of left shoulder 10/31/2018  . Long term (current) use of anticoagulants 05/22/2018  . Hereditary hemochromatosis ([place]) 03/30/2016  . Gout 04/09/2015  . Ventral hernia 03/03/2014  . History of prostate cancer 11/28/2013  . OSA (obstructive sleep apnea) 07/12/2012  . DM (diabetes mellitus), type 2, uncontrolled with complications ([place]) 54/00/8676  . Hemochromatosis 02/14/2011  . Actinic keratosis 02/14/2011  . Hypertriglyceridemia 06/13/2010  . HEMORRHOIDS, EXTERNAL 03/21/2010  . History of aortic valve replacement 10/13/2009  . ADENOCARCINOMA, PROSTATE 06/17/2009  . TOBACCO USER 06/16/2009  . HOH (hard of hearing) 06/16/2009  . Essential hypertension 06/16/2009  . CORONARY ATHEROSCLEROSIS NATIVE CORONARY ARTERY 06/15/2009  . ERECTILE DYSFUNCTION, ORGANIC 05/18/2009  . Hypertrophic obstructive cardiomyopathy ([place]) 05/06/2008  . OBESITY, MODERATE 04/29/2008    Current Outpatient Medications on File Prior to Visit  Medication Sig Dispense Refill  . aspirin EC 81 MG tablet Take 1 tablet (81 mg total) by mouth daily. 90 tablet 3  . atorvastatin (LIPITOR) 20 MG tablet TAKE 1  TABLET (20 MG TOTAL) BY MOUTH DAILY. OFFICE VISIT NEEDED 90 tablet 1  . betamethasone dipropionate (DIPROLENE) 0.05 % cream Apply topically 2 (two) times daily as needed. To eczema rash 30 g 1  . Blood Glucose Monitoring Suppl Supplies MISC Use for monitoring glucose level 100 each 1  . colchicine 0.6 MG tablet Take 2 tabs by mouth now and then 1 tab in 1 hour for gout. 3 tablet 0  . fenofibrate (TRICOR) 145 MG tablet TAKE 1 TABLET BY MOUTH EVERY DAY 90 tablet 1  . furosemide (LASIX) 20 MG tablet TAKE 1 TABLET (20 MG TOTAL) BY MOUTH DAILY AS NEEDED. FOR SWELLING 90 tablet 0  . imiquimod (ALDARA) 5 % cream Apply topically 3 (three) times a week.    . metoprolol succinate (TOPROL-XL) 25 MG 24 hr tablet TAKE 1 TABLET BY MOUTH EVERY DAY 90 tablet 1  . olmesartan (BENICAR) 5 MG tablet Take 1 tablet (5 mg total) by mouth daily. 30 tablet 5  . omega-3 acid ethyl esters (LOVAZA) 1 g capsule TAKE 1 CAPSULE BY MOUTH TWICE A DAY 180 capsule 1  . OneTouch Delica Lancets 19J MISC USE AS DIRECTED 100 each 1  . ONETOUCH VERIO test strip USE AS INSTRUCTED TO CHECK BLOOD SUGAR TWICE A DAY. DX E11.8 100 strip 4  . OVER THE COUNTER MEDICATION OLIVE OIL. Take 1 tablespoon (1480mg ) every afternoon    . warfarin (COUMADIN) 7.5 MG tablet TAKE 1/2 TO 1 TABLET DAILY AS DIRECTED BY COUMADIN CLINIC 90 tablet 1  . [DISCONTINUED] fenofibrate (TRICOR) 145 MG tablet Take 1 tablet (145  mg total) by mouth daily. 90 tablet 1  . [DISCONTINUED] NOVOFINE PLUS 32G X 4 MM MISC USE FOR INJECTIONS INTO THE SKIN EVERY EVENING 100 each 3   No current facility-administered medications on file prior to visit.    No Known Allergies  Review of Systems No fevers, chills, nausea, muscle aches, no difficulty breathing, no calf pain, no chest pain or shortness of breath.   Physical Exam  GENERAL APPEARANCE: Alert, conversant. Appropriately groomed. No acute distress.   VASCULAR: Pedal pulses palpable DP and PT bilateral.  Capillary  refill time is immediate to all digits,  Proximal to distal cooling it warm to warm.  Digital perfusion adequate.   NEUROLOGIC: sensation is intact epicritically and protectively to 5.07 monofilament at 3/5 sites bilateral- sensation to distal digits is absent bilaterally.  Light touch is intact bilateral, vibratory sensation intact bilateral, achilles tendon reflex is intact bilateral.   MUSCULOSKELETAL: acceptable muscle strength, tone and stability bilateral.    No pain, crepitus or limitation noted with foot and ankle range of motion bilateral. Hammertoe of the left 3rd digit is noted.  DERMATOLOGIC: skin is warm, supple, and dry.  No open lesions noted.  No rash, no pre ulcerative lesions. Well circumscribed hyperkeratotic lesion is present submet 5 of the right foot- prominent fifth metatarsal head is noted. .  Central core is present with a ground glass appearance.  Intact integument post debridement is noted.       Assessment     ICD-10-CM   1. Corns and callosities  L84   2. Type 2 diabetes mellitus with diabetic polyneuropathy, with long-term current use of insulin (HCC)  E11.42    Z79.4   3. Prominent metatarsal head of right foot  M21.6X1      Plan  Pared the callus with a 15 blade without complication.  Recommended using a pumice stone to keep the callus down and routine debridment when needed.  He will return as needed for follow up. Discussed the positive benefits of diabetic shoes with accommodative inserts. He will consider and call if he is interested.

## 2019-09-25 DIAGNOSIS — N1832 Chronic kidney disease, stage 3b: Secondary | ICD-10-CM | POA: Diagnosis not present

## 2019-09-25 DIAGNOSIS — Z952 Presence of prosthetic heart valve: Secondary | ICD-10-CM | POA: Diagnosis not present

## 2019-09-25 DIAGNOSIS — I2510 Atherosclerotic heart disease of native coronary artery without angina pectoris: Secondary | ICD-10-CM | POA: Diagnosis not present

## 2019-09-25 DIAGNOSIS — E1122 Type 2 diabetes mellitus with diabetic chronic kidney disease: Secondary | ICD-10-CM | POA: Diagnosis not present

## 2019-09-25 DIAGNOSIS — I129 Hypertensive chronic kidney disease with stage 1 through stage 4 chronic kidney disease, or unspecified chronic kidney disease: Secondary | ICD-10-CM | POA: Diagnosis not present

## 2019-09-25 DIAGNOSIS — E785 Hyperlipidemia, unspecified: Secondary | ICD-10-CM | POA: Diagnosis not present

## 2019-10-01 ENCOUNTER — Ambulatory Visit (INDEPENDENT_AMBULATORY_CARE_PROVIDER_SITE_OTHER): Payer: Medicare HMO

## 2019-10-01 ENCOUNTER — Other Ambulatory Visit: Payer: Self-pay

## 2019-10-01 DIAGNOSIS — I359 Nonrheumatic aortic valve disorder, unspecified: Secondary | ICD-10-CM | POA: Diagnosis not present

## 2019-10-01 DIAGNOSIS — Z952 Presence of prosthetic heart valve: Secondary | ICD-10-CM

## 2019-10-01 DIAGNOSIS — Z7901 Long term (current) use of anticoagulants: Secondary | ICD-10-CM

## 2019-10-01 DIAGNOSIS — Z5181 Encounter for therapeutic drug level monitoring: Secondary | ICD-10-CM

## 2019-10-01 LAB — POCT INR: INR: 2.1 (ref 2.0–3.0)

## 2019-10-01 NOTE — Patient Instructions (Signed)
Continue with 1 tablet each Monday, Wednesday and Friday, 1/2 tablet all other days.  Repeat INR in 6 weeks  

## 2019-10-04 ENCOUNTER — Other Ambulatory Visit: Payer: Self-pay | Admitting: Cardiology

## 2019-10-04 DIAGNOSIS — R011 Cardiac murmur, unspecified: Secondary | ICD-10-CM

## 2019-10-05 ENCOUNTER — Other Ambulatory Visit: Payer: Self-pay | Admitting: Family

## 2019-10-07 DIAGNOSIS — R69 Illness, unspecified: Secondary | ICD-10-CM | POA: Diagnosis not present

## 2019-10-29 ENCOUNTER — Other Ambulatory Visit: Payer: Self-pay | Admitting: Cardiology

## 2019-10-29 DIAGNOSIS — R011 Cardiac murmur, unspecified: Secondary | ICD-10-CM

## 2019-10-29 MED ORDER — OMEGA-3-ACID ETHYL ESTERS 1 G PO CAPS: 1.0000 | ORAL_CAPSULE | Freq: Two times a day (BID) | ORAL | 0 refills | Status: DC

## 2019-10-29 NOTE — Telephone Encounter (Signed)
°*  STAT* If patient is at the pharmacy, call can be transferred to refill team.   1. Which medications need to be refilled? (please list name of each medication and dose if known) omega-3 acid ethyl esters (LOVAZA) 1 g capsule   2. Which pharmacy/location (including street and city if local pharmacy) is medication to be sent to? [id number] - [place], [place] - [address] AT [place], [place] [address]  Phone:  [phone] Fax:  [phone]   3. Do they need a 30 day or 90 day supply? 82  Pt stated he had to move his appt to 11/3 at 9:40 to fu with Dr Stanford Breed

## 2019-11-12 ENCOUNTER — Ambulatory Visit (INDEPENDENT_AMBULATORY_CARE_PROVIDER_SITE_OTHER): Payer: Medicare HMO

## 2019-11-12 ENCOUNTER — Other Ambulatory Visit: Payer: Self-pay

## 2019-11-12 DIAGNOSIS — I359 Nonrheumatic aortic valve disorder, unspecified: Secondary | ICD-10-CM | POA: Diagnosis not present

## 2019-11-12 DIAGNOSIS — R69 Illness, unspecified: Secondary | ICD-10-CM | POA: Diagnosis not present

## 2019-11-12 DIAGNOSIS — Z952 Presence of prosthetic heart valve: Secondary | ICD-10-CM

## 2019-11-12 DIAGNOSIS — Z7901 Long term (current) use of anticoagulants: Secondary | ICD-10-CM

## 2019-11-12 DIAGNOSIS — Z5181 Encounter for therapeutic drug level monitoring: Secondary | ICD-10-CM | POA: Diagnosis not present

## 2019-11-12 LAB — POCT INR: INR: 2.3 (ref 2.0–3.0)

## 2019-11-12 NOTE — Patient Instructions (Signed)
Continue with 1 tablet each Monday, Wednesday and Friday, 1/2 tablet all other days.  Repeat INR in 6 weeks  

## 2019-11-13 ENCOUNTER — Other Ambulatory Visit: Payer: Self-pay | Admitting: Family

## 2019-11-13 ENCOUNTER — Other Ambulatory Visit: Payer: Self-pay | Admitting: Cardiology

## 2019-11-19 ENCOUNTER — Ambulatory Visit: Payer: Medicare HMO | Admitting: Cardiology

## 2019-11-19 DIAGNOSIS — Z8546 Personal history of malignant neoplasm of prostate: Secondary | ICD-10-CM | POA: Diagnosis not present

## 2019-11-23 DIAGNOSIS — R69 Illness, unspecified: Secondary | ICD-10-CM | POA: Diagnosis not present

## 2019-11-24 ENCOUNTER — Encounter: Payer: Self-pay | Admitting: Family

## 2019-11-24 DIAGNOSIS — H9190 Unspecified hearing loss, unspecified ear: Secondary | ICD-10-CM

## 2019-11-27 NOTE — Progress Notes (Signed)
HPI: Followup of coronary artery disease. Patient is status post coronary artery bypassing graft (LIMA to the LAD, saphenous vein graft to the marginal, saphenous vein graft sequentially to the right coronary artery and distal circumflex) as well as aortic valve replacement (St. Jude aortic valve) in 2005. He also had a septal myectomy at that time. Carotid Dopplers in May of 2013 showed 0-39% bilateral stenosis.  Abdominal CT April 2015 showed no aneurysm. Nuclear study May 2016 showed normal perfusion and normal LV function. Echocardiogram October 2020 showed normal LV function, moderate basal septal hypertrophy, grade 1 diastolic dysfunction, mild biatrial enlargement, previous aortic valve replacement with mean gradient 17 mmHg, mildly dilated ascending aorta at 43 mm.  Since last seen, he denies dyspnea on exertion, orthopnea, PND, pedal edema or syncope.  He occasionally has an ache in his upper chest with vigorous activities such as carrying heavy loads relieved with rest.  He has occasional dizziness with standing.  Current Outpatient Medications  Medication Sig Dispense Refill  . aspirin EC 81 MG tablet Take 1 tablet (81 mg total) by mouth daily. 90 tablet 3  . atorvastatin (LIPITOR) 20 MG tablet TAKE 1 TABLET (20 MG TOTAL) BY MOUTH DAILY. OFFICE VISIT NEEDED 90 tablet 1  . betamethasone dipropionate (DIPROLENE) 0.05 % cream Apply topically 2 (two) times daily as needed. To eczema rash 30 g 1  . Blood Glucose Monitoring Suppl Supplies MISC Use for monitoring glucose level 100 each 1  . colchicine 0.6 MG tablet Take 2 tabs by mouth now and then 1 tab in 1 hour for gout. 3 tablet 0  . Dulaglutide (TRULICITY) 1.5 WR/6.0AV SOPN Inject 0.5 mLs (1.5 mg total) into the skin once a week. 2 mL 11  . fenofibrate (TRICOR) 145 MG tablet TAKE 1 TABLET BY MOUTH EVERY DAY 90 tablet 1  . furosemide (LASIX) 20 MG tablet TAKE 1 TABLET (20 MG TOTAL) BY MOUTH DAILY AS NEEDED. FOR SWELLING 90 tablet 0  .  imiquimod (ALDARA) 5 % cream Apply topically 3 (three) times a week.    . Insulin Glargine (BASAGLAR KWIKPEN) 100 UNIT/ML Inject 0.34 mLs (34 Units total) into the skin daily. 30 mL 3  . metoprolol succinate (TOPROL-XL) 25 MG 24 hr tablet TAKE 1 TABLET BY MOUTH EVERY DAY 90 tablet 1  . olmesartan (BENICAR) 5 MG tablet Take 1 tablet (5 mg total) by mouth daily. 30 tablet 5  . omega-3 acid ethyl esters (LOVAZA) 1 g capsule Take 1 capsule (1 g total) by mouth 2 (two) times daily. 180 capsule 0  . OneTouch Delica Lancets 40J MISC USE AS DIRECTED 100 each 1  . ONETOUCH VERIO test strip USE AS INSTRUCTED TO CHECK BLOOD SUGAR TWICE A DAY. DX E11.8 100 strip 4  . OVER THE COUNTER MEDICATION OLIVE OIL. Take 1 tablespoon (1480mg ) every afternoon    . warfarin (COUMADIN) 7.5 MG tablet TAKE 1/2 TO 1 TABLET DAILY AS DIRECTED BY COUMADIN CLINIC 90 tablet 1   No current facility-administered medications for this visit.     Past Medical History:  Diagnosis Date  . Allergy   . Aortic stenosis   . CAD (coronary artery disease)    cabg  . Cancer [facility]) 2011   prostate  . Erectile dysfunction   . Heart murmur   . Hemochromatosis 02/14/2011  . Hemorrhoids   . History of gout   . History of hepatitis 1974  . Hyperlipidemia   . Hypertension   .  Hypertr obst cardiomyop   . Obesity    moderate  . Sensorineural hearing loss   . Type II or unspecified type diabetes mellitus without mention of complication, not stated as uncontrolled     Past Surgical History:  Procedure Laterality Date  . 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 APPENDECTOMY  1990  . CHOLECYSTECTOMY  1990  . CORONARY ARTERY BYPASS GRAFT  10/2003  . HERNIA REPAIR  1999   right, inguinal  . HERNIA REPAIR  2002   left, inguinal  . HIP SURGERY  2006   right hip  . PILONIDAL CYST EXCISION  1964  . prostate seed implant  3/12  . TONSILLECTOMY  childhood    Social History   Socioeconomic History  . Marital status: Married     Spouse name: Not on file  . Number of children: Not on file  . Years of education: Not on file  . Highest education level: Not on file  Occupational History  . Not on file  Tobacco Use  . Smoking status: Former Smoker    Types: Cigars  . Smokeless tobacco: Never Used  . Tobacco comment: Never used Tobacco  Substance and Sexual Activity  . Alcohol use: Yes    Alcohol/week: 0.0 standard drinks    Comment: 4 glasses wine a month  . Drug use: No  . Sexual activity: Never  Other Topics Concern  . Not on file  Social History Narrative  . Not on file   Social Determinants of Health   Financial Resource Strain:   . Difficulty of Paying Living Expenses: Not on file  Food Insecurity:   . Worried About Charity fundraiser in the Last Year: Not on file  . Ran Out of Food in the Last Year: Not on file  Transportation Needs:   . Lack of Transportation (Medical): Not on file  . Lack of Transportation (Non-Medical): Not on file  Physical Activity:   . Days of Exercise per Week: Not on file  . Minutes of Exercise per Session: Not on file  Stress:   . Feeling of Stress : Not on file  Social Connections:   . Frequency of Communication with Friends and Family: Not on file  . Frequency of Social Gatherings with Friends and Family: Not on file  . Attends Religious Services: Not on file  . Active Member of Clubs or Organizations: Not on file  . Attends Archivist Meetings: Not on file  . Marital Status: Not on file  Intimate Partner Violence:   . Fear of Current or Ex-Partner: Not on file  . Emotionally Abused: Not on file  . Physically Abused: Not on file  . Sexually Abused: Not on file    Family History  Problem Relation Age of Onset  . Heart disease Mother   . Stroke Mother   . Heart disease Father   . Asperger's syndrome Son   . Hyperlipidemia Son   . Coronary artery disease Brother   . Cancer Neg Hx        negative for colon cancer    ROS: no fevers or  chills, productive cough, hemoptysis, dysphasia, odynophagia, melena, hematochezia, dysuria, hematuria, rash, seizure activity, orthopnea, PND, pedal edema, claudication. Remaining systems are negative.  Physical Exam: Well-developed well-nourished in no acute distress.  Skin is warm and dry.  HEENT is normal.  Neck is supple.  Chest is clear to auscultation with normal expansion.  Cardiovascular exam  is regular rate and rhythm.  Crisp mechanical valve sound.  2/6 systolic murmur left sternal border.  No diastolic murmur. Abdominal exam nontender or distended. No masses palpated. Extremities show no edema. neuro grossly intact  ECG-normal sinus rhythm at a rate of 67, lateral T wave inversion.  Personally reviewed  A/P  1 status post aortic valve replacement-continue Coumadin and aspirin 81 mg daily.  Continue SBE prophylaxis.  2  history of hypertrophic cardiomyopathy-status post myectomy.  Continue beta-blocker.  3 coronary artery disease-patient denies chest pain.  Plan to continue medical therapy.  Continue statin.  4 hypertension-patient's blood pressure is controlled.  However he notes some dizziness with standing at times.  I would like for his blood pressure to run higher.  Discontinue Benicar and follow.  5 hyperlipidemia-continue statin.  I reviewed his most recent laboratories from 23rd 2021.  LDL 99.  Increase Lipitor to 40 mg daily.  Check lipids and liver in 12 weeks.  6 dilated aortic root-we will plan to repeat echocardiogram to reassess size.  I would like to avoid CTA or MRA given baseline renal insufficiency.  Kirk Ruths, MD

## 2019-12-03 ENCOUNTER — Other Ambulatory Visit: Payer: Self-pay

## 2019-12-03 ENCOUNTER — Ambulatory Visit: Payer: Medicare HMO | Admitting: Cardiology

## 2019-12-03 ENCOUNTER — Encounter: Payer: Self-pay | Admitting: Cardiology

## 2019-12-03 VITALS — BP 118/54 | HR 67 | Ht 69.5 in | Wt 220.1 lb

## 2019-12-03 DIAGNOSIS — I421 Obstructive hypertrophic cardiomyopathy: Secondary | ICD-10-CM | POA: Diagnosis not present

## 2019-12-03 DIAGNOSIS — Z951 Presence of aortocoronary bypass graft: Secondary | ICD-10-CM | POA: Diagnosis not present

## 2019-12-03 DIAGNOSIS — I10 Essential (primary) hypertension: Secondary | ICD-10-CM | POA: Diagnosis not present

## 2019-12-03 DIAGNOSIS — E785 Hyperlipidemia, unspecified: Secondary | ICD-10-CM

## 2019-12-03 DIAGNOSIS — Z952 Presence of prosthetic heart valve: Secondary | ICD-10-CM

## 2019-12-03 MED ORDER — ATORVASTATIN CALCIUM 40 MG PO TABS: 40.0000 mg | ORAL_TABLET | Freq: Every day | ORAL | 3 refills | Status: DC

## 2019-12-03 NOTE — Patient Instructions (Signed)
Medication Instructions:   STOP OLMASARTAN  INCREASE ATORVASTATIN TO 40 MG ONCE DAILY= 2 OF THE 20 MG TABLETS ONCE DAILY   *If you need a refill on your cardiac medications before your next appointment, please call your pharmacy*   Lab Work:  Your physician recommends that you return for lab work in: 12 WEEKS=FASTING  If you have labs (blood work) drawn today and your tests are completely normal, you will receive your results only by: Marland Kitchen MyChart Message (if you have MyChart) OR . A paper copy in the mail If you have any lab test that is abnormal or we need to change your treatment, we will call you to review the results.   Testing/Procedures:  Your physician has requested that you have an echocardiogram. Echocardiography is a painless test that uses sound waves to create images of your heart. It provides your doctor with information about the size and shape of your heart and how well your heart's chambers and valves are working. This procedure takes approximately one hour. There are no restrictions for this procedure.[place] [facility]-1ST FLOOR IMAGING DEPARTMENT     Follow-Up: At [facility], you and your health needs are our priority.  As part of our continuing mission to provide you with exceptional heart care, we have created designated Provider Care Teams.  These Care Teams include your primary Cardiologist (physician) and Advanced Practice Providers (APPs -  Physician Assistants and Nurse Practitioners) who all work together to provide you with the care you need, when you need it.  We recommend signing up for the patient portal called "MyChart".  Sign up information is provided on this After Visit Summary.  MyChart is used to connect with patients for Virtual Visits (Telemedicine).  Patients are able to view lab/test results, encounter notes, upcoming appointments, etc.  Non-urgent messages can be sent to your provider as well.   To learn more about what you can do with MyChart,  go to [email or web address].    Your next appointment:   6 month(s)  The format for your next appointment:   In Person  Provider:   Kirk Ruths, MD

## 2019-12-05 ENCOUNTER — Ambulatory Visit (HOSPITAL_BASED_OUTPATIENT_CLINIC_OR_DEPARTMENT_OTHER)
Admission: RE | Admit: 2019-12-05 | Discharge: 2019-12-05 | Disposition: A | Discharge status: Home / Self Care | Payer: Medicare HMO | Source: Ambulatory Visit | Attending: Cardiology | Admitting: Cardiology

## 2019-12-05 ENCOUNTER — Other Ambulatory Visit: Payer: Self-pay

## 2019-12-05 DIAGNOSIS — Z952 Presence of prosthetic heart valve: Secondary | ICD-10-CM

## 2019-12-05 LAB — ECHOCARDIOGRAM COMPLETE
AR max vel: 1.15 cm2
AV Area VTI: 1.09 cm2
AV Area mean vel: 1.32 cm2
AV Mean grad: 12.5 mmHg
AV Peak grad: 24.3 mmHg
Ao pk vel: 2.47 m/s
Area-P 1/2: 2.05 cm2
S' Lateral: 2.04 cm

## 2019-12-10 ENCOUNTER — Encounter: Payer: Self-pay | Admitting: *Deleted

## 2019-12-24 ENCOUNTER — Other Ambulatory Visit: Payer: Self-pay

## 2019-12-24 ENCOUNTER — Ambulatory Visit (INDEPENDENT_AMBULATORY_CARE_PROVIDER_SITE_OTHER): Payer: Medicare HMO

## 2019-12-24 DIAGNOSIS — I359 Nonrheumatic aortic valve disorder, unspecified: Secondary | ICD-10-CM

## 2019-12-24 DIAGNOSIS — Z952 Presence of prosthetic heart valve: Secondary | ICD-10-CM | POA: Diagnosis not present

## 2019-12-24 DIAGNOSIS — Z5181 Encounter for therapeutic drug level monitoring: Secondary | ICD-10-CM

## 2019-12-24 LAB — POCT INR: INR: 3.1 — AB (ref 2.0–3.0)

## 2019-12-24 NOTE — Patient Instructions (Signed)
Continue with 1 tablet each Monday, Wednesday and Friday, 1/2 tablet all other days.  Repeat INR in 6 weeks  

## 2019-12-25 ENCOUNTER — Encounter: Payer: Self-pay | Admitting: Internal Medicine

## 2019-12-29 ENCOUNTER — Other Ambulatory Visit: Payer: Self-pay | Admitting: Family

## 2019-12-29 ENCOUNTER — Encounter: Payer: Self-pay | Admitting: *Deleted

## 2019-12-29 DIAGNOSIS — E1159 Type 2 diabetes mellitus with other circulatory complications: Secondary | ICD-10-CM

## 2019-12-29 DIAGNOSIS — I152 Hypertension secondary to endocrine disorders: Secondary | ICD-10-CM

## 2020-01-09 DIAGNOSIS — R69 Illness, unspecified: Secondary | ICD-10-CM | POA: Diagnosis not present

## 2020-01-29 ENCOUNTER — Encounter: Payer: Self-pay | Admitting: Family

## 2020-01-29 DIAGNOSIS — G4733 Obstructive sleep apnea (adult) (pediatric): Secondary | ICD-10-CM

## 2020-01-29 NOTE — Telephone Encounter (Signed)
[name], could you do me a favor please (tomorrow is fine). Could you please print the pended rx and fax to number listed?  Thanks!

## 2020-02-03 ENCOUNTER — Other Ambulatory Visit: Payer: Self-pay

## 2020-02-03 ENCOUNTER — Ambulatory Visit (INDEPENDENT_AMBULATORY_CARE_PROVIDER_SITE_OTHER): Payer: Medicare HMO | Admitting: Internal Medicine

## 2020-02-03 VITALS — BP 150/84 | HR 70 | Ht 69.5 in | Wt 218.4 lb

## 2020-02-03 DIAGNOSIS — E1159 Type 2 diabetes mellitus with other circulatory complications: Secondary | ICD-10-CM | POA: Diagnosis not present

## 2020-02-03 DIAGNOSIS — N183 Chronic kidney disease, stage 3 unspecified: Secondary | ICD-10-CM | POA: Diagnosis not present

## 2020-02-03 DIAGNOSIS — E1122 Type 2 diabetes mellitus with diabetic chronic kidney disease: Secondary | ICD-10-CM

## 2020-02-03 DIAGNOSIS — E1142 Type 2 diabetes mellitus with diabetic polyneuropathy: Secondary | ICD-10-CM | POA: Diagnosis not present

## 2020-02-03 DIAGNOSIS — Z794 Long term (current) use of insulin: Secondary | ICD-10-CM | POA: Diagnosis not present

## 2020-02-03 LAB — GLUCOSE, POCT (MANUAL RESULT ENTRY): POC Glucose: 355 mg/dl — AB (ref 70–99)

## 2020-02-03 LAB — POCT GLYCOSYLATED HEMOGLOBIN (HGB A1C): Hemoglobin A1C: 8.8 % — AB (ref 4.0–5.6)

## 2020-02-03 MED ORDER — INSULIN PEN NEEDLE 32G X 4 MM MISC: 1.0000 | 3 refills | Status: DC

## 2020-02-03 MED ORDER — TRULICITY 3 MG/0.5ML ~~LOC~~ SOAJ: 3.0000 mg | SUBCUTANEOUS | 6 refills | Status: DC

## 2020-02-03 MED ORDER — BASAGLAR KWIKPEN 100 UNIT/ML ~~LOC~~ SOPN: 38.0000 [IU] | PEN_INJECTOR | Freq: Every day | SUBCUTANEOUS | 4 refills | Status: DC

## 2020-02-03 NOTE — Progress Notes (Signed)
Name: [name]  Age/ Sex: 75 y.o., male   MRN/ DOB: [id number], 12/26/1945     PCP: Sandford Craze, NP   Reason for Endocrinology Evaluation: Type 2 Diabetes Mellitus  Initial Endocrine Consultative Visit: 11/11/2018    PATIENT IDENTIFIER: [name] is a 75 y.o. male with a past medical history of T2DM, OSA, HTN, CAD ( S/P CABG) . The patient has followed with Endocrinology clinic since 11/11/2018 for consultative assistance with management of his diabetes.  DIABETIC HISTORY:  [name] was diagnosed with T2DM in 2005. He was on metformin which was stopped due to renal dysfunction . Has been on insulin since 2015His hemoglobin A1c has ranged from 6.3% in 2015, peaking at 8.3% in 2020.  On his initial visit to our clinic he was on basal insulin only. With an A1c of 8.3. Trulicity was added.   SUBJECTIVE:   During the last visit (8/25 /2021): A1c of 7.9 %.  We continued Trulicity and increased basal insulin   Today (02/03/2020): [name] is here for a  follow up on diabetes management.  He checks his blood sugars 2 times daily, preprandial to breakfast and bedtime. The patient has not had hypoglycemic episodes since the last clinic visit.  Denies nausea, vomiting nor diarrhea     HOME DIABETES REGIMEN:  Basaglar 34 units daily  Trulicity 1.5 mg once weekly    GLUCOSE LOG:  Did not bring        DIABETIC COMPLICATIONS: Microvascular complications:   CKDIII- Dr. Signe Colt   Denies: neuropathy, retinopathy   Last eye exam: Completed 08/2019  Macrovascular complications:   CAD (S/P CABG )   Denies: PVD, CVA   HISTORY:  Past Medical History:  Past Medical History:  Diagnosis Date  . Allergy   . Aortic stenosis   . CAD (coronary artery disease)    cabg  . Cancer [facility]) 2011   prostate  . Erectile dysfunction   . Heart murmur   . Hemochromatosis 02/14/2011  . Hemorrhoids   . History of gout   . History of hepatitis 1974  .  Hyperlipidemia   . Hypertension   . Hypertr obst cardiomyop   . Obesity    moderate  . Sensorineural hearing loss   . Type II or unspecified type diabetes mellitus without mention of complication, not stated as uncontrolled    Past Surgical History:  Past Surgical History:  Procedure Laterality Date  . AORTIC VALVE REPLACEMENT  11/14/2003   [employer]  . APPENDECTOMY  1990  . CHOLECYSTECTOMY  1990  . CORONARY ARTERY BYPASS GRAFT  10/2003  . HERNIA REPAIR  1999   right, inguinal  . HERNIA REPAIR  2002   left, inguinal  . HIP SURGERY  2006   right hip  . PILONIDAL CYST EXCISION  1964  . prostate seed implant  3/12  . TONSILLECTOMY  childhood    Social History:  reports that he has quit smoking. His smoking use included cigars. He has never used smokeless tobacco. He reports current alcohol use. He reports that he does not use drugs. Family History:  Family History  Problem Relation Age of Onset  . Heart disease Mother   . Stroke Mother   . Heart disease Father   . Asperger's syndrome Son   . Hyperlipidemia Son   . Coronary artery disease Brother   . Cancer Neg Hx        negative for colon  cancer     HOME MEDICATIONS: Allergies as of 02/03/2020   No Known Allergies     Medication List       Accurate as of February 03, 2020 12:20 PM. If you have any questions, ask your nurse or doctor.        STOP taking these medications   omega-3 acid ethyl esters 1 g capsule Commonly known as: LOVAZA Stopped by: David Sciara, MD   OVER THE COUNTER MEDICATION Stopped by: David Sciara, MD   Trulicity 1.5 0000000 Sopn Generic drug: Dulaglutide Replaced by: Trulicity 3 0000000 Sopn Stopped by: David Sciara, MD     TAKE these medications   aspirin EC 81 MG tablet Take 1 tablet (81 mg total) by mouth daily.   atorvastatin 40 MG tablet Commonly known as: LIPITOR Take 1 tablet (40 mg total) by mouth daily.   Basaglar KwikPen 100  UNIT/ML Inject 0.34 mLs (34 Units total) into the skin daily.   betamethasone dipropionate 0.05 % cream Apply topically 2 (two) times daily as needed. To eczema rash   Blood Glucose Monitoring Suppl Supplies Misc Use for monitoring glucose level   colchicine 0.6 MG tablet Take 2 tabs by mouth now and then 1 tab in 1 hour for gout.   fenofibrate 145 MG tablet Commonly known as: TRICOR TAKE 1 TABLET BY MOUTH EVERY DAY   furosemide 20 MG tablet Commonly known as: LASIX TAKE 1 TABLET (20 MG TOTAL) BY MOUTH DAILY AS NEEDED. FOR SWELLING   imiquimod 5 % cream Commonly known as: ALDARA Apply topically 3 (three) times a week.   metoprolol succinate 25 MG 24 hr tablet Commonly known as: TOPROL-XL TAKE 1 TABLET BY MOUTH EVERY DAY   OneTouch Delica Lancets 99991111 Misc USE AS DIRECTED   OneTouch Verio test strip Generic drug: glucose blood USE AS INSTRUCTED TO CHECK BLOOD SUGAR TWICE A DAY. DX [id number]   Trulicity 3 0000000 Sopn Generic drug: Dulaglutide Inject 3 mg as directed once a week. Replaces: Trulicity 1.5 0000000 Sopn Started by: David Sciara, MD   warfarin 7.5 MG tablet Commonly known as: COUMADIN Take as directed by the anticoagulation clinic. If you are unsure how to take this medication, talk to your nurse or doctor. Original instructions: TAKE 1/2 TO 1 TABLET DAILY AS DIRECTED BY COUMADIN CLINIC        OBJECTIVE:   Vital Signs: BP (!) 150/84   Pulse 70   Ht 5' 9.5" (1.765 m)   Wt 218 lb 6 oz (99.1 kg)   SpO2 98%   BMI 31.79 kg/m   Wt Readings from Last 3 Encounters:  02/03/20 218 lb 6 oz (99.1 kg)  12/03/19 220 lb 1.9 oz (99.8 kg)  09/24/19 216 lb 9.6 oz (98.2 kg)     Exam: General: Pt appears well and is in NAD  Lungs: Clear with good BS bilat with no rales, rhonchi, or wheezes  Heart: RRR with normal S1 and S2 and no gallops; no murmurs; no rub  Extremities: No pretibial edema.   Neuro: MS is good with appropriate affect, pt is alert and  Ox3       DM foot exam: Pt states he had a food exam through podiatry 09/23/2019     DATA REVIEWED:  Lab Results  Component Value Date   HGBA1C 8.8 (A) 02/03/2020   HGBA1C 7.9 (A) 09/24/2019   HGBA1C 6.8 (A) 03/26/2019   Lab Results  Component Value Date   MICROALBUR  8.7 (H) 09/09/2014   LDLCALC 99 09/22/2019   CREATININE 1.63 (H) 09/22/2019   Lab Results  Component Value Date   MICRALBCREAT 5.0 09/09/2014     Lab Results  Component Value Date   CHOL 172 09/22/2019   HDL 26 (L) 09/22/2019   LDLCALC 99 09/22/2019   LDLDIRECT 83.0 09/06/2018   TRIG 355 (H) 09/22/2019   CHOLHDL 6.6 (H) 09/22/2019       In Office BG 355  mg/dL   ASSESSMENT / PLAN / RECOMMENDATIONS:   1) Type 2 Diabetes Mellitus, Poorly controlled, With CKD III , Neuropathic and Macrovascular complications - Most recent A1c of 8.8 %. Goal A1c < 7.5%.   - Worsening glycemic control, pt admits to dietary indiscretions due to travel in the last few months.  - His in-office BG today was 355 mg/dL, this is 3 hours after eating cereal, coffee and chocolate chip cookie  - Will make the following adjustments. He understands we are limited in oral glycemic agents due to CKD.   MEDICATIONS:  Increase Basaglar 38 units  Increase Trulicity to 3 mg weekly   EDUCATION / INSTRUCTIONS:  BG monitoring instructions: Patient is instructed to check his blood sugars 2 times a day, fasting and bedtime .  Call [place] [facility] if: BG persistently < 70 . I reviewed the Rule of 15 for the treatment of hypoglycemia in detail with the patient. Literature supplied.     F/U in 4 months    Signed electronically by: Mack Guise, MD  [facility]  [address] [place], [place] [address] Phone: [phone] FAX: [phone]   CC: [name], [address] [place] [address] Phone: [phone]  Fax:  [phone]  Return to Endocrinology clinic as below: Future Appointments  Date Time Provider [place]  02/04/2020 11:45 AM [facility]  02/17/2020 11:10 AM Milus Banister, MD [facility]  03/24/2020 10:00 AM [name], NP [facility]  06/02/2020 10:40 AM [name], NP [facility]

## 2020-02-03 NOTE — Patient Instructions (Addendum)
-    Increase Basaglar to 38 units daily  - Increase  Trulicity to 3 mg weekly      HOW TO TREAT LOW BLOOD SUGARS (Blood sugar LESS THAN 70 MG/DL)  Please follow the RULE OF 15 for the treatment of hypoglycemia treatment (when your (blood sugars are less than 70 mg/dL)    STEP 1: Take 15 grams of carbohydrates when your blood sugar is low, which includes:   3-4 GLUCOSE TABS  OR  3-4 OZ OF JUICE OR REGULAR SODA OR  ONE TUBE OF GLUCOSE GEL     STEP 2: RECHECK blood sugar in 15 MINUTES STEP 3: If your blood sugar is still low at the 15 minute recheck --> then, go back to STEP 1 and treat AGAIN with another 15 grams of carbohydrates.

## 2020-02-04 ENCOUNTER — Ambulatory Visit (INDEPENDENT_AMBULATORY_CARE_PROVIDER_SITE_OTHER): Payer: Medicare HMO

## 2020-02-04 DIAGNOSIS — I359 Nonrheumatic aortic valve disorder, unspecified: Secondary | ICD-10-CM | POA: Diagnosis not present

## 2020-02-04 DIAGNOSIS — Z7901 Long term (current) use of anticoagulants: Secondary | ICD-10-CM

## 2020-02-04 DIAGNOSIS — Z5181 Encounter for therapeutic drug level monitoring: Secondary | ICD-10-CM | POA: Diagnosis not present

## 2020-02-04 DIAGNOSIS — Z952 Presence of prosthetic heart valve: Secondary | ICD-10-CM

## 2020-02-04 LAB — POCT INR: INR: 2.8 (ref 2.0–3.0)

## 2020-02-04 NOTE — Patient Instructions (Signed)
Continue with 1 tablet each Monday, Wednesday and Friday, 1/2 tablet all other days.  Repeat INR in 6 weeks  

## 2020-02-10 MED ORDER — AMBULATORY NON FORMULARY MEDICATION: 99 refills | Status: AC

## 2020-02-13 ENCOUNTER — Telehealth: Payer: Self-pay | Admitting: Family

## 2020-02-13 NOTE — Telephone Encounter (Signed)
Please let me know if patient needs a visit to discuss history of OSA

## 2020-02-13 NOTE — Telephone Encounter (Signed)
We should set up a visit please. OK to do virtually if he wants.

## 2020-02-13 NOTE — Telephone Encounter (Signed)
Caller: [name] ([place]) Call back # [phone]  They need an office notes that talks about how patient benefits from using c-pad.

## 2020-02-16 NOTE — Telephone Encounter (Signed)
Attempted to reach pt and received message that voice mailbox has not been set up. Sent [employer].

## 2020-02-17 ENCOUNTER — Ambulatory Visit: Payer: Medicare HMO | Admitting: Gastroenterology

## 2020-02-17 NOTE — Telephone Encounter (Signed)
[name] -- please see pt's mychart response and advise?

## 2020-03-03 ENCOUNTER — Telehealth: Payer: Self-pay | Admitting: Cardiology

## 2020-03-03 NOTE — Telephone Encounter (Signed)
New message:     Cecille Rubin from [employer] calling concering omega 3 cap 1 gm will be covered. They can give them 30 days supply after it will not be covered.

## 2020-03-03 NOTE — Telephone Encounter (Signed)
Spoke with Janett Billow from [employer]. Lovaza or Omega 3 is not covered by patient's insurance. Lovaza is no longer listed on patient's medication list at this time, previously discontinued. No further action.

## 2020-03-08 ENCOUNTER — Encounter: Payer: Self-pay | Admitting: *Deleted

## 2020-03-08 ENCOUNTER — Encounter: Payer: Self-pay | Admitting: Family

## 2020-03-09 ENCOUNTER — Ambulatory Visit (INDEPENDENT_AMBULATORY_CARE_PROVIDER_SITE_OTHER): Payer: Medicare HMO | Admitting: Family

## 2020-03-09 ENCOUNTER — Other Ambulatory Visit: Payer: Self-pay

## 2020-03-09 ENCOUNTER — Encounter: Payer: Self-pay | Admitting: Family

## 2020-03-09 VITALS — BP 134/68 | HR 69 | Temp 98.2°F | Resp 16 | Ht 69.5 in | Wt 216.0 lb

## 2020-03-09 DIAGNOSIS — M5416 Radiculopathy, lumbar region: Secondary | ICD-10-CM | POA: Diagnosis not present

## 2020-03-09 DIAGNOSIS — R21 Rash and other nonspecific skin eruption: Secondary | ICD-10-CM | POA: Diagnosis not present

## 2020-03-09 NOTE — Patient Instructions (Signed)
You may use tylenol as needed for pain.  Please do below exercise twice daily.   Back Exercises The following exercises strengthen the muscles that help to support the trunk and back. They also help to keep the lower back flexible. Doing these exercises can help to prevent back pain or lessen existing pain.  If you have back pain or discomfort, try doing these exercises 2-3 times each day or as told by your health care provider.  As your pain improves, do them once each day, but increase the number of times that you repeat the steps for each exercise (do more repetitions).  To prevent the recurrence of back pain, continue to do these exercises once each day or as told by your health care provider. Do exercises exactly as told by your health care provider and adjust them as directed. It is normal to feel mild stretching, pulling, tightness, or discomfort as you do these exercises, but you should stop right away if you feel sudden pain or your pain gets worse. Exercises Single knee to chest Repeat these steps 3-5 times for each leg: 1. Lie on your back on a firm bed or the floor with your legs extended. 2. Bring one knee to your chest. Your other leg should stay extended and in contact with the floor. 3. Hold your knee in place by grabbing your knee or thigh with both hands and hold. 4. Pull on your knee until you feel a gentle stretch in your lower back or buttocks. 5. Hold the stretch for 10-30 seconds. 6. Slowly release and straighten your leg. Pelvic tilt Repeat these steps 5-10 times: 1. Lie on your back on a firm bed or the floor with your legs extended. 2. Bend your knees so they are pointing toward the ceiling and your feet are flat on the floor. 3. Tighten your lower abdominal muscles to press your lower back against the floor. This motion will tilt your pelvis so your tailbone points up toward the ceiling instead of pointing to your feet or the floor. 4. With gentle tension and  even breathing, hold this position for 5-10 seconds. Cat-cow Repeat these steps until your lower back becomes more flexible: 1. Get into a hands-and-knees position on a firm surface. Keep your hands under your shoulders, and keep your knees under your hips. You may place padding under your knees for comfort. 2. Let your head hang down toward your chest. Contract your abdominal muscles and point your tailbone toward the floor so your lower back becomes rounded like the back of a cat. 3. Hold this position for 5 seconds. 4. Slowly lift your head, let your abdominal muscles relax and point your tailbone up toward the ceiling so your back forms a sagging arch like the back of a cow. 5. Hold this position for 5 seconds.   Press-ups Repeat these steps 5-10 times: 1. Lie on your abdomen (face-down) on the floor. 2. Place your palms near your head, about shoulder-width apart. 3. Keeping your back as relaxed as possible and keeping your hips on the floor, slowly straighten your arms to raise the top half of your body and lift your shoulders. Do not use your back muscles to raise your upper torso. You may adjust the placement of your hands to make yourself more comfortable. 4. Hold this position for 5 seconds while you keep your back relaxed. 5. Slowly return to lying flat on the floor.   Bridges Repeat these steps 10 times: 1. Shanda Howells  on your back on a firm surface. 2. Bend your knees so they are pointing toward the ceiling and your feet are flat on the floor. Your arms should be flat at your sides, next to your body. 3. Tighten your buttocks muscles and lift your buttocks off the floor until your waist is at almost the same height as your knees. You should feel the muscles working in your buttocks and the back of your thighs. If you do not feel these muscles, slide your feet 1-2 inches farther away from your buttocks. 4. Hold this position for 3-5 seconds. 5. Slowly lower your hips to the starting position,  and allow your buttocks muscles to relax completely. If this exercise is too easy, try doing it with your arms crossed over your chest.   Abdominal crunches Repeat these steps 5-10 times: 1. Lie on your back on a firm bed or the floor with your legs extended. 2. Bend your knees so they are pointing toward the ceiling and your feet are flat on the floor. 3. Cross your arms over your chest. 4. Tip your chin slightly toward your chest without bending your neck. 5. Tighten your abdominal muscles and slowly raise your trunk (torso) high enough to lift your shoulder blades a tiny bit off the floor. Avoid raising your torso higher than that because it can put too much stress on your low back and does not help to strengthen your abdominal muscles. 6. Slowly return to your starting position. Back lifts Repeat these steps 5-10 times: 1. Lie on your abdomen (face-down) with your arms at your sides, and rest your forehead on the floor. 2. Tighten the muscles in your legs and your buttocks. 3. Slowly lift your chest off the floor while you keep your hips pressed to the floor. Keep the back of your head in line with the curve in your back. Your eyes should be looking at the floor. 4. Hold this position for 3-5 seconds. 5. Slowly return to your starting position. Contact a health care provider if:  Your back pain or discomfort gets much worse when you do an exercise.  Your worsening back pain or discomfort does not lessen within 2 hours after you exercise. If you have any of these problems, stop doing these exercises right away. Do not do them again unless your health care provider says that you can. Get help right away if:  You develop sudden, severe back pain. If this happens, stop doing the exercises right away. Do not do them again unless your health care provider says that you can. This information is not intended to replace advice given to you by your health care provider. Make sure you discuss any  questions you have with your health care provider. Document Revised: 05/23/2018 Document Reviewed: 10/18/2017 Elsevier Patient Education  [place].

## 2020-03-09 NOTE — Telephone Encounter (Signed)
Patient here to be seen

## 2020-03-09 NOTE — Progress Notes (Signed)
Subjective:    Patient ID: [name], male    DOB: Nov 27, 1945, 76 y.o.   MRN: [id number]  HPI  Patient is a 75 yr old male who presents today with chief complaint of left hip pain. Last weekend soreness across this lower back.  Then started to radiate to the left hip and front of his left leg.  Getting out of the chair makes it worse. Started early last week.    Skin rash- right axilla.  He reports that it will come and go with topical creams.  No pruritus.  Review of Systems    see HPI  Past Medical History:  Diagnosis Date  . Allergy   . Aortic stenosis   . CAD (coronary artery disease)    cabg  . Cancer [facility]) 2011   prostate  . Erectile dysfunction   . Heart murmur   . Hemochromatosis 02/14/2011  . Hemorrhoids   . History of gout   . History of hepatitis 1974  . Hyperlipidemia   . Hypertension   . Hypertr obst cardiomyop   . Obesity    moderate  . Sensorineural hearing loss   . Type II or unspecified type diabetes mellitus without mention of complication, not stated as uncontrolled      Social History   Socioeconomic History  . Marital status: Married    Spouse name: Not on file  . Number of children: Not on file  . Years of education: Not on file  . Highest education level: Not on file  Occupational History  . Not on file  Tobacco Use  . Smoking status: Former Smoker    Types: Cigars  . Smokeless tobacco: Never Used  . Tobacco comment: Never used Tobacco  Substance and Sexual Activity  . Alcohol use: Yes    Alcohol/week: 0.0 standard drinks    Comment: 4 glasses wine a month  . Drug use: No  . Sexual activity: Never  Other Topics Concern  . Not on file  Social History Narrative  . Not on file   Social Determinants of Health   Financial Resource Strain: Not on file  Food Insecurity: Not on file  Transportation Needs: Not on file  Physical Activity: Not on file  Stress: Not on file  Social Connections: Not on file  Intimate Partner  Violence: Not on file    Past Surgical History:  Procedure Laterality Date  . AORTIC VALVE REPLACEMENT  11/14/2003   [facility]  . APPENDECTOMY  1990  . CHOLECYSTECTOMY  1990  . CORONARY ARTERY BYPASS GRAFT  10/2003  . HERNIA REPAIR  1999   right, inguinal  . HERNIA REPAIR  2002   left, inguinal  . HIP SURGERY  2006   right hip  . PILONIDAL CYST EXCISION  1964  . prostate seed implant  3/12  . TONSILLECTOMY  childhood    Family History  Problem Relation Age of Onset  . Heart disease Mother   . Stroke Mother   . Heart disease Father   . Asperger's syndrome Son   . Hyperlipidemia Son   . Coronary artery disease Brother   . Cancer Neg Hx        negative for colon cancer    No Known Allergies  Current Outpatient Medications on File Prior to Visit  Medication Sig Dispense Refill  . AMBULATORY NON FORMULARY MEDICATION cpap cushions            AirFit F20 (Size: Large)  AirFit F30 (Size: Med)  Please FAX the prescription to:  [phone] 12 each prn  . aspirin EC 81 MG tablet Take 1 tablet (81 mg total) by mouth daily. 90 tablet 3  . atorvastatin (LIPITOR) 40 MG tablet Take 1 tablet (40 mg total) by mouth daily. 90 tablet 3  . betamethasone dipropionate (DIPROLENE) 0.05 % cream Apply topically 2 (two) times daily as needed. To eczema rash 30 g 1  . Blood Glucose Monitoring Suppl Supplies MISC Use for monitoring glucose level 100 each 1  . colchicine 0.6 MG tablet Take 2 tabs by mouth now and then 1 tab in 1 hour for gout. 3 tablet 0  . Dulaglutide (TRULICITY) 3 UX/3.2TF SOPN Inject 3 mg as directed once a week. 2 mL 6  . fenofibrate (TRICOR) 145 MG tablet TAKE 1 TABLET BY MOUTH EVERY DAY 90 tablet 1  . furosemide (LASIX) 20 MG tablet TAKE 1 TABLET (20 MG TOTAL) BY MOUTH DAILY AS NEEDED. FOR SWELLING 90 tablet 0  . imiquimod (ALDARA) 5 % cream Apply topically 3 (three) times a week.    . Insulin Glargine (BASAGLAR KWIKPEN) 100 UNIT/ML Inject 38 Units into  the skin daily. 30 mL 4  . Insulin Pen Needle 32G X 4 MM MISC 1 Device by Does not apply route as directed. 100 each 3  . metoprolol succinate (TOPROL-XL) 25 MG 24 hr tablet TAKE 1 TABLET BY MOUTH EVERY DAY 90 tablet 1  . OneTouch Delica Lancets 57D MISC USE AS DIRECTED 100 each 1  . ONETOUCH VERIO test strip USE AS INSTRUCTED TO CHECK BLOOD SUGAR TWICE A DAY. DX E11.8 100 strip 4  . warfarin (COUMADIN) 7.5 MG tablet TAKE 1/2 TO 1 TABLET DAILY AS DIRECTED BY COUMADIN CLINIC 90 tablet 1   No current facility-administered medications on file prior to visit.    BP 134/68 (BP Location: Right Arm, Patient Position: Sitting, Cuff Size: Large)   Pulse 69   Temp 98.2 F (36.8 C) (Oral)   Resp 16   Ht 5' 9.5" (1.765 m)   Wt 216 lb (98 kg)   SpO2 98%   BMI 31.44 kg/m    Objective:   Physical Exam Constitutional:      General: He is not in acute distress.    Appearance: He is well-developed and well-nourished.  HENT:     Head: Normocephalic and atraumatic.  Cardiovascular:     Rate and Rhythm: Normal rate and regular rhythm.     Heart sounds: No murmur heard.   Pulmonary:     Effort: Pulmonary effort is normal. No respiratory distress.     Breath sounds: Normal breath sounds. No wheezing or rales.  Musculoskeletal:        General: No edema.  Skin:    General: Skin is warm and dry.     Comments: Round patch of erythematous rash noted in right axilla.  Neurological:     Mental Status: He is alert and oriented to person, place, and time.     Deep Tendon Reflexes:     Reflex Scores:      Patellar reflexes are 2+ on the right side and 2+ on the left side.    Comments: Bilateral lower extremity strength is 5 out of 5 Negative left-sided straight leg raise  Psychiatric:        Mood and Affect: Mood and affect normal.        Behavior: Behavior normal.        Thought  Content: Thought content normal.           Assessment & Plan:  Lumbar radiculopathy-most consistent with L2/L3  dermatome.  He is not a good candidate for NSAIDs due to Coumadin.  He is also not a good candidate for steroids due to his diabetes.  I suggested use of as needed Tylenol and back exercises twice daily as listed on his checkout sheet.  He will let me know if symptoms worsen or if symptoms fail to improve.  Skin rash right axilla-uncontrolled.  Refer to dermatology.  This visit occurred during the SARS-CoV-2 public health emergency.  Safety protocols were in place, including screening questions prior to the visit, additional usage of staff PPE, and extensive cleaning of exam room while observing appropriate contact time as indicated for disinfecting solutions.

## 2020-03-16 ENCOUNTER — Telehealth: Payer: Self-pay | Admitting: Dermatology

## 2020-03-16 DIAGNOSIS — G4733 Obstructive sleep apnea (adult) (pediatric): Secondary | ICD-10-CM | POA: Diagnosis not present

## 2020-03-16 NOTE — Telephone Encounter (Signed)
Notes documented in referral and routed it back to referring office. 

## 2020-03-16 NOTE — Telephone Encounter (Signed)
Patient's wife, Pamala Hurry, called to set up a referral appointment, but wanted it with Deirdre Pippins, PA-C who is with [facility].  Patient wants referral sent back and sent to [facility] where he has been seen before.

## 2020-03-17 ENCOUNTER — Other Ambulatory Visit: Payer: Self-pay

## 2020-03-17 ENCOUNTER — Ambulatory Visit (INDEPENDENT_AMBULATORY_CARE_PROVIDER_SITE_OTHER): Payer: Medicare HMO

## 2020-03-17 DIAGNOSIS — I359 Nonrheumatic aortic valve disorder, unspecified: Secondary | ICD-10-CM

## 2020-03-17 DIAGNOSIS — Z5181 Encounter for therapeutic drug level monitoring: Secondary | ICD-10-CM

## 2020-03-17 DIAGNOSIS — Z952 Presence of prosthetic heart valve: Secondary | ICD-10-CM

## 2020-03-17 DIAGNOSIS — Z7901 Long term (current) use of anticoagulants: Secondary | ICD-10-CM

## 2020-03-17 LAB — POCT INR: INR: 2.1 (ref 2.0–3.0)

## 2020-03-17 NOTE — Patient Instructions (Signed)
Continue with 1 tablet each Monday, Wednesday and Friday, 1/2 tablet all other days.  Repeat INR in 6 weeks

## 2020-03-24 ENCOUNTER — Other Ambulatory Visit: Payer: Self-pay | Admitting: Family

## 2020-03-24 ENCOUNTER — Ambulatory Visit: Payer: Medicare HMO | Admitting: Family

## 2020-03-25 DIAGNOSIS — H5203 Hypermetropia, bilateral: Secondary | ICD-10-CM | POA: Diagnosis not present

## 2020-03-25 DIAGNOSIS — H524 Presbyopia: Secondary | ICD-10-CM | POA: Diagnosis not present

## 2020-03-25 DIAGNOSIS — H52223 Regular astigmatism, bilateral: Secondary | ICD-10-CM | POA: Diagnosis not present

## 2020-03-25 LAB — HM DIABETES EYE EXAM

## 2020-03-29 ENCOUNTER — Other Ambulatory Visit: Payer: Self-pay

## 2020-03-29 ENCOUNTER — Emergency Department
Admission: EM | Admit: 2020-03-29 | Discharge: 2020-03-29 | Disposition: A | Discharge status: Home / Self Care | Payer: Medicare HMO | Source: Home / Self Care

## 2020-03-29 DIAGNOSIS — M79605 Pain in left leg: Secondary | ICD-10-CM

## 2020-03-29 DIAGNOSIS — T148XXA Other injury of unspecified body region, initial encounter: Secondary | ICD-10-CM

## 2020-03-29 MED ORDER — TIZANIDINE HCL 4 MG PO TABS: 4.0000 mg | ORAL_TABLET | Freq: Four times a day (QID) | ORAL | 0 refills | Status: DC | PRN

## 2020-03-29 NOTE — ED Triage Notes (Signed)
Patient presents to Urgent Care with complaints of left upper leg pain since about 10 days ago. Patient reports he had it evaluated by his PCP and she thought there was a pinched nerve. Pt states it feels like there is a knot in his upper leg, has been applying heat but it has not helped. States the pain is worse at rest, has been taking tylenol.

## 2020-03-29 NOTE — ED Provider Notes (Signed)
[facility]   [id number] 03/29/20 Arrival Time: 0903  XB:DZHGD PAIN  SUBJECTIVE: History from: patient. [name] is a 75 y.o. male complains of left leg pain that began 10 days ago.  Denies a precipitating event or specific injury.  Localizes the pain to the left upper leg.  Describes the pain as  intermittent and achy in character. Reports that it feels like there is a knot in his leg. Has tried OTC medications and heat without relief. Symptoms are made worse with rest. Reports that he has seen his PCP for this and that he was told it was a pinched nerve. Reports that the pain is the worst when he stands from sitting and feels like his L leg is going to give out due to the pain. Denies similar symptoms in the past. Denies fever, chills, erythema, ecchymosis, effusion, weakness, numbness and tingling, saddle paresthesias, loss of bowel or bladder function.      ROS: As per HPI.  All other pertinent ROS negative.     Past Medical History:  Diagnosis Date  . Allergy   . Aortic stenosis   . CAD (coronary artery disease)    cabg  . Cancer [facility]) 2011   prostate  . Erectile dysfunction   . Heart murmur   . Hemochromatosis 02/14/2011  . Hemorrhoids   . History of gout   . History of hepatitis 1974  . Hyperlipidemia   . Hypertension   . Hypertr obst cardiomyop   . Obesity    moderate  . Sensorineural hearing loss   . Type II or unspecified type diabetes mellitus without mention of complication, not stated as uncontrolled    Past Surgical History:  Procedure Laterality Date  . AORTIC VALVE REPLACEMENT  11/14/2003   [facility]  . APPENDECTOMY  1990  . CHOLECYSTECTOMY  1990  . CORONARY ARTERY BYPASS GRAFT  10/2003  . HERNIA REPAIR  1999   right, inguinal  . HERNIA REPAIR  2002   left, inguinal  . HIP SURGERY  2006   right hip  . PILONIDAL CYST EXCISION  1964  . prostate seed implant  3/12  . TONSILLECTOMY  childhood   No Known Allergies No current  facility-administered medications on file prior to encounter.   Current Outpatient Medications on File Prior to Encounter  Medication Sig Dispense Refill  . AMBULATORY NON FORMULARY MEDICATION cpap cushions            AirFit F20 (Size: Large)           AirFit F30 (Size: Med)  Please FAX the prescription to:  [phone] 12 each prn  . aspirin EC 81 MG tablet Take 1 tablet (81 mg total) by mouth daily. 90 tablet 3  . atorvastatin (LIPITOR) 40 MG tablet Take 1 tablet (40 mg total) by mouth daily. 90 tablet 3  . betamethasone dipropionate (DIPROLENE) 0.05 % cream Apply topically 2 (two) times daily as needed. To eczema rash 30 g 1  . Blood Glucose Monitoring Suppl Supplies MISC Use for monitoring glucose level 100 each 1  . colchicine 0.6 MG tablet Take 2 tabs by mouth now and then 1 tab in 1 hour for gout. 3 tablet 0  . Dulaglutide (TRULICITY) 3 JM/4.2AS SOPN Inject 3 mg as directed once a week. 2 mL 6  . fenofibrate (TRICOR) 145 MG tablet TAKE 1 TABLET BY MOUTH EVERY DAY 90 tablet 1  . furosemide (LASIX) 20 MG tablet Take 1 tablet (20 mg  total) by mouth daily as needed. For swelling 90 tablet 0  . imiquimod (ALDARA) 5 % cream Apply topically 3 (three) times a week.    . Insulin Glargine (BASAGLAR KWIKPEN) 100 UNIT/ML Inject 38 Units into the skin daily. 30 mL 4  . Insulin Pen Needle 32G X 4 MM MISC 1 Device by Does not apply route as directed. 100 each 3  . metoprolol succinate (TOPROL-XL) 25 MG 24 hr tablet TAKE 1 TABLET BY MOUTH EVERY DAY 90 tablet 1  . OneTouch Delica Lancets 65H MISC USE AS DIRECTED 100 each 1  . ONETOUCH VERIO test strip USE AS INSTRUCTED TO CHECK BLOOD SUGAR TWICE A DAY. DX E11.8 100 strip 4  . warfarin (COUMADIN) 7.5 MG tablet TAKE 1/2 TO 1 TABLET DAILY AS DIRECTED BY COUMADIN CLINIC 90 tablet 1   Social History   Socioeconomic History  . Marital status: Married    Spouse name: Not on file  . Number of children: Not on file  . Years of education: Not on file   . Highest education level: Not on file  Occupational History  . Not on file  Tobacco Use  . Smoking status: Light Tobacco Smoker    Types: Cigars  . Smokeless tobacco: Never Used  Substance and Sexual Activity  . Alcohol use: Yes    Alcohol/week: 0.0 standard drinks    Comment: 4 glasses wine a month  . Drug use: No  . Sexual activity: Never  Other Topics Concern  . Not on file  Social History Narrative  . Not on file   Social Determinants of Health   Financial Resource Strain: Not on file  Food Insecurity: Not on file  Transportation Needs: Not on file  Physical Activity: Not on file  Stress: Not on file  Social Connections: Not on file  Intimate Partner Violence: Not on file   Family History  Problem Relation Age of Onset  . Heart disease Mother   . Stroke Mother   . Heart disease Father   . Asperger's syndrome Son   . Hyperlipidemia Son   . Coronary artery disease Brother   . Cancer Neg Hx        negative for colon cancer    OBJECTIVE:  Vitals:   03/29/20 0913  BP: (!) 145/73  Pulse: 73  Resp: 15  Temp: 99 F (37.2 C)  TempSrc: Oral  SpO2: 97%    General appearance: ALERT; in no acute distress.  Head: NCAT Lungs: Normal respiratory effort CV: pulses 2+ bilaterally. Cap refill < 2 seconds Musculoskeletal:  Inspection: Skin warm, dry, clear and intact No erythema, effusion noted Palpation: Non tender to palpation, L thigh in spasm ROM: FROM active and passive to L leg Skin: warm and dry Neurologic: Ambulates without difficulty; Sensation intact about the upper/ lower extremities Psychological: alert and cooperative; normal mood and affect  DIAGNOSTIC STUDIES:  No results found.   ASSESSMENT & PLAN:  1. Muscle strain   2. Left leg pain      Meds ordered this encounter  Medications  . tiZANidine (ZANAFLEX) 4 MG tablet    Sig: Take 1 tablet (4 mg total) by mouth every 6 (six) hours as needed for muscle spasms.    Dispense:  30 tablet     Refill:  0    Order Specific Question:   Supervising Provider    Answer:   Chase Picket [id number]    Continue conservative management of rest, ice, and gentle stretches  Take tylenol as needed for pain relief. Take the tizanidine every 6 hours as needed for muscle spasms. Avoid driving or operating heavy machinery while using medication. Follow up with PCP if symptoms persist Return or go to the ER if you have any new or worsening symptoms (fever, chills, chest pain, abdominal pain, changes in bowel or bladder habits, pain radiating into lower legs)   Reviewed expectations re: course of current medical issues. Questions answered. Outlined signs and symptoms indicating need for more acute intervention. Patient verbalized understanding. After Visit Summary given.       Faustino Congress, NP 03/29/20 [id number]

## 2020-03-29 NOTE — Discharge Instructions (Addendum)
I have sent in tizanidine for you to take every 6 hours as needed for muscle pain and spasms.  Follow up with this office or with primary care if symptoms are persisting.  Follow up in the ER for high fever, trouble swallowing, trouble breathing, other concerning symptoms.

## 2020-03-30 ENCOUNTER — Ambulatory Visit: Payer: Medicare HMO | Admitting: Gastroenterology

## 2020-03-30 ENCOUNTER — Telehealth: Payer: Self-pay

## 2020-03-30 ENCOUNTER — Encounter: Payer: Self-pay | Admitting: Gastroenterology

## 2020-03-30 VITALS — BP 140/70 | HR 68 | Ht 69.5 in | Wt 218.0 lb

## 2020-03-30 DIAGNOSIS — Z1211 Encounter for screening for malignant neoplasm of colon: Secondary | ICD-10-CM

## 2020-03-30 MED ORDER — SUPREP BOWEL PREP KIT 17.5-3.13-1.6 GM/177ML PO SOLN
1.0000 | ORAL | 0 refills | Status: DC
Start: 2020-03-30 — End: 2021-01-20

## 2020-03-30 NOTE — Telephone Encounter (Signed)
[facility] Pre-operative Risk Assessment     Request for surgical clearance:     Endoscopy Procedure  What type of surgery is being performed?     Colonoscopy  When is this surgery scheduled?     06-02-2020  What type of clearance is required ?   Pharmacy  Are there any medications that need to be held prior to surgery and how long? Warfarin x 5 days?  Lovenox bridge?  Practice name and name of physician performing surgery?      [facility]  What is your office phone and fax number?      Phone- [phone]  [phone]  Anesthesia type (None, local, MAC, general) ?       MAC

## 2020-03-30 NOTE — Patient Instructions (Addendum)
If you are age 75 or older, your body mass index should be between 23-30. Your Body mass index is 31.73 kg/m. If this is out of the aforementioned range listed, please consider follow up with your Primary Care Provider.  You have been scheduled for a colonoscopy. Please follow written instructions given to you at your visit today.  Please pick up your prep supplies at the pharmacy within the next 1-3 days. If you use inhalers (even only as needed), please bring them with you on the day of your procedure.  You will be contacted by our office prior to your procedure for directions on holding your warfarin.  If you do not hear from our office 1 week prior to your scheduled procedure, please call [phone] to discuss.   Due to recent changes in healthcare laws, you may see the results of your imaging and laboratory studies on MyChart before your provider has had a chance to review them.  We understand that in some cases there may be results that are confusing or concerning to you. Not all laboratory results come back in the same time frame and the provider may be waiting for multiple results in order to interpret others.  Please give [name] 48 hours in order for your provider to thoroughly review all the results before contacting the office for clarification of your results.   Thank you for entrusting me with your care and choosing [facility].  Dr Ardis Hughs

## 2020-03-30 NOTE — Telephone Encounter (Signed)
Will route to PharmD for rec's re: holding anticoagulation. Richardson Dopp, PA-C    03/30/2020 5:21 PM

## 2020-03-30 NOTE — Progress Notes (Signed)
HPI: This is a very pleasant 75 year old man who is here with his wife today  They are here to discuss colon cancer screening.  I did a colonoscopy for him September 2011 for routine risk colon cancer screening.  He had some hemorrhoids and blood in his rectum from his recent prostate biopsy but otherwise the examination was normal.  No polyps or cancers.  He has coronary artery disease with a CABG as well as aortic valve replacement, St. Jude[employer] in 2005.  He is on Coumadin.  Echocardiogram November 2021 showed 60 to 65% left ventricular ejection fraction.  Colon cancer does not run in his family.  He has no GI issues.  Specifically no constipation, no diarrhea, no overt GI bleeding.  His weight is overall stable.  He is quite hard of hearing.  Review of systems: Pertinent positive and negative review of systems were noted in the above HPI section. All other review negative.   Past Medical History:  Diagnosis Date  . Allergy   . Aortic stenosis   . CAD (coronary artery disease)    cabg  . Cancer [facility]) 2011   prostate  . Erectile dysfunction   . Heart murmur   . Hemochromatosis 02/14/2011  . Hemorrhoids   . History of gout   . History of hepatitis 1974  . Hyperlipidemia   . Hypertension   . Hypertr obst cardiomyop   . Obesity    moderate  . Sensorineural hearing loss   . Type II or unspecified type diabetes mellitus without mention of complication, not stated as uncontrolled     Past Surgical History:  Procedure Laterality Date  . AORTIC VALVE REPLACEMENT  11/14/2003   [facility]  . APPENDECTOMY  1990  . CHOLECYSTECTOMY  1990  . CORONARY ARTERY BYPASS GRAFT  10/2003  . HERNIA REPAIR  1999   right, inguinal  . HERNIA REPAIR  2002   left, inguinal  . HIP SURGERY  2006   right hip  . PILONIDAL CYST EXCISION  1964  . prostate seed implant  3/12  . TONSILLECTOMY  childhood    Current Outpatient Medications  Medication Sig Dispense Refill  . AMBULATORY NON  FORMULARY MEDICATION cpap cushions            AirFit F20 (Size: Large)           AirFit F30 (Size: Med)  Please FAX the prescription to:  [phone] 12 each prn  . aspirin EC 81 MG tablet Take 1 tablet (81 mg total) by mouth daily. 90 tablet 3  . atorvastatin (LIPITOR) 40 MG tablet Take 1 tablet (40 mg total) by mouth daily. 90 tablet 3  . betamethasone dipropionate (DIPROLENE) 0.05 % cream Apply topically 2 (two) times daily as needed. To eczema rash 30 g 1  . Blood Glucose Monitoring Suppl Supplies MISC Use for monitoring glucose level 100 each 1  . colchicine 0.6 MG tablet Take 2 tabs by mouth now and then 1 tab in 1 hour for gout. 3 tablet 0  . Dulaglutide (TRULICITY) 3 ZC/5.8IF SOPN Inject 3 mg as directed once a week. 2 mL 6  . fenofibrate (TRICOR) 145 MG tablet TAKE 1 TABLET BY MOUTH EVERY DAY 90 tablet 1  . furosemide (LASIX) 20 MG tablet Take 1 tablet (20 mg total) by mouth daily as needed. For swelling 90 tablet 0  . imiquimod (ALDARA) 5 % cream Apply topically 3 (three) times a week.    . Insulin Glargine (  BASAGLAR KWIKPEN) 100 UNIT/ML Inject 38 Units into the skin daily. 30 mL 4  . Insulin Pen Needle 32G X 4 MM MISC 1 Device by Does not apply route as directed. 100 each 3  . metoprolol succinate (TOPROL-XL) 25 MG 24 hr tablet TAKE 1 TABLET BY MOUTH EVERY DAY 90 tablet 1  . OneTouch Delica Lancets 03J MISC USE AS DIRECTED 100 each 1  . ONETOUCH VERIO test strip USE AS INSTRUCTED TO CHECK BLOOD SUGAR TWICE A DAY. DX E11.8 100 strip 4  . tiZANidine (ZANAFLEX) 4 MG tablet Take 1 tablet (4 mg total) by mouth every 6 (six) hours as needed for muscle spasms. 30 tablet 0  . warfarin (COUMADIN) 7.5 MG tablet TAKE 1/2 TO 1 TABLET DAILY AS DIRECTED BY COUMADIN CLINIC 90 tablet 1   No current facility-administered medications for this visit.    Allergies as of 03/30/2020  . (No Known Allergies)    Family History  Problem Relation Age of Onset  . Heart disease Mother   . Stroke  Mother   . Heart disease Father   . Asperger's syndrome Son   . Hyperlipidemia Son   . Coronary artery disease Brother   . Cancer Neg Hx        negative for colon cancer    Social History   Socioeconomic History  . Marital status: Married    Spouse name: Not on file  . Number of children: Not on file  . Years of education: Not on file  . Highest education level: Not on file  Occupational History  . Not on file  Tobacco Use  . Smoking status: Light Tobacco Smoker    Types: Cigars  . Smokeless tobacco: Never Used  Substance and Sexual Activity  . Alcohol use: Yes    Alcohol/week: 0.0 standard drinks    Comment: 4 glasses wine a month  . Drug use: No  . Sexual activity: Never  Other Topics Concern  . Not on file  Social History Narrative  . Not on file   Social Determinants of Health   Financial Resource Strain: Not on file  Food Insecurity: Not on file  Transportation Needs: Not on file  Physical Activity: Not on file  Stress: Not on file  Social Connections: Not on file  Intimate Partner Violence: Not on file     Physical Exam: BP 140/70   Pulse 68   Ht 5' 9.5" (1.765 m)   Wt 218 lb (98.9 kg)   BMI 31.73 kg/m  Constitutional: generally well-appearing Psychiatric: alert and oriented x3 Eyes: extraocular movements intact Mouth: oral pharynx moist, no lesions Neck: supple no lymphadenopathy Cardiovascular: heart regular rate and rhythm Lungs: clear to auscultation bilaterally Abdomen: soft, nontender, nondistended, no obvious ascites, no peritoneal signs, normal bowel sounds Extremities: no lower extremity edema bilaterally Skin: no lesions on visible extremities   Assessment and plan: 75 y.o. male with routine risk for colon cancer, elevated risk for procedure related complications given his ongoing Coumadin use  I recommended colonoscopy at his soonest convenience.  I recommended that he stop his Coumadin for 5 days prior and he understands that we  will reach out to Dr. Stanford Breed and the anticoagulation clinic to make sure they are okay with that recommendation.  Likely he will need to start Lovenox shots as he has done in the past when holding his Coumadin.   Please see the "Patient Instructions" section for addition details about the plan.   Owens Loffler, MD  [facility] 03/30/2020, 10:52 AM  Cc: [name], NP  Total time on date of encounter was 40  minutes (this included time spent preparing to see the patient reviewing records; obtaining and/or reviewing separately obtained history; performing a medically appropriate exam and/or evaluation; counseling and educating the patient and family if present; ordering medications, tests or procedures if applicable; and documenting clinical information in the health record).

## 2020-03-31 NOTE — Telephone Encounter (Signed)
Patient with diagnosis of St Jude mechanical AVR on warfarin for anticoagulation.    Procedure: colonoscopy Date of procedure: 06/02/20  CrCl 50mL/min using adjusted body weight Platelet count 248K  Per office protocol, patient can hold warfarin for 5 days prior to procedure. Patient will NOT need bridging with Lovenox (enoxaparin) around procedure.

## 2020-03-31 NOTE — Telephone Encounter (Signed)
   Primary Cardiologist: Kirk Ruths, MD  Chart reviewed as part of pre-operative protocol coverage. Given past medical history and time since last visit, based on ACC/AHA guidelines, [name] would be at acceptable risk for the planned procedure without further cardiovascular testing.   Per pharmacy:  Patient with diagnosis of St Jude mechanical AVR on warfarin for anticoagulation.    Procedure: colonoscopy Date of procedure: 06/02/20  CrCl 79mL/min using adjusted body weight Platelet count 248K  Per office protocol, patient can hold warfarin for 5 days prior to procedure. Patient will NOT need bridging with Lovenox (enoxaparin) around procedure.  I will route this recommendation to the requesting party via Epic fax function and remove from pre-op pool.  Please call with questions.  Kathyrn Drown, NP 03/31/2020, 9:22 AM

## 2020-04-01 NOTE — Telephone Encounter (Signed)
Patient aware that he has been given clearance to hold warfarin 5 days prior to colonoscopy scheduled for 06-02-2020.  Patient advised that he will NOT have to have lovenox bridging prior to procedure.  Patient advised to take last dose of warfarin on the evening of 05-27-2020.  Patient will be given instructions by Dr Ardis Hughs on when to restart warfarin.  Patient agreed to plan and verbalized understanding.  No further questions.

## 2020-04-04 ENCOUNTER — Telehealth: Payer: Self-pay | Admitting: Family

## 2020-04-05 ENCOUNTER — Emergency Department
Admission: EM | Admit: 2020-04-05 | Discharge: 2020-04-05 | Disposition: A | Discharge status: Home / Self Care | Payer: Medicare HMO | Source: Home / Self Care | Attending: Family Medicine | Admitting: Family Medicine

## 2020-04-05 ENCOUNTER — Other Ambulatory Visit: Payer: Self-pay

## 2020-04-05 DIAGNOSIS — M79605 Pain in left leg: Secondary | ICD-10-CM | POA: Diagnosis not present

## 2020-04-05 MED ORDER — PREDNISONE 20 MG PO TABS: ORAL_TABLET | ORAL | 0 refills | Status: DC

## 2020-04-05 NOTE — ED Provider Notes (Signed)
[name]    CSN: [id number] Arrival date & time: 04/05/20  1704      History   Chief Complaint Chief Complaint  Patient presents with   Leg Pain    Left thigh    HPI [name] is a 75 y.o. male.   HPI   Patient has continued pain in his left leg.  He states that this began when he saw his primary care doctor last on 03/09/2020.  This is been going on for a month.  He states is getting worse.  He states he came back in here last week and was given muscle relaxers.  This has not helped.  He is unable to take NSAID drugs because of his Coumadin.  He is a poor candidate for steroids because of his insulin-dependent diabetes and poor diabetes control.  As I look at and examined him he does have symptoms that are consistent with a lumbar radiculopathy.  I discussed with him using steroids to treat this, with strict warning to watch the carbohydrates in his diet, monitor his diabetes closely, and call his primary care doctor if he fails to improve. He had no accident or injury The pain is down the front of the left leg It also hurts when he lifts his right leg (down the left leg) No numbness or weakness Certain movements are severely painful he states "an 11"   Past Medical History:  Diagnosis Date   Allergy    Aortic stenosis    CAD (coronary artery disease)    cabg   Cancer ([place]) 2011   prostate   Erectile dysfunction    Heart murmur    Hemochromatosis 02/14/2011   Hemorrhoids    History of gout    History of hepatitis 1974   Hyperlipidemia    Hypertension    Hypertr obst cardiomyop    Obesity    moderate   Sensorineural hearing loss    Type II or unspecified type diabetes mellitus without mention of complication, not stated as uncontrolled     Patient Active Problem List   Diagnosis Date Noted   Type 2 diabetes mellitus with hyperglycemia, with long-term current use of insulin ([place]) 11/11/2018   Type 2 diabetes mellitus with  diabetic polyneuropathy, with long-term current use of insulin ([place]) 11/11/2018   Type 2 diabetes mellitus with stage 3 chronic kidney disease, with long-term current use of insulin ([place]) 11/11/2018   Diabetes mellitus ([place]) 11/11/2018   Hx of CABG 11/05/2018   Dyslipidemia 11/05/2018   Primary osteoarthritis of left shoulder 10/31/2018   Long term (current) use of anticoagulants 05/22/2018   Hereditary hemochromatosis ([place]) 03/30/2016   Gout 04/09/2015   Ventral hernia 03/03/2014   History of prostate cancer 11/28/2013   OSA (obstructive sleep apnea) 07/12/2012   DM (diabetes mellitus), type 2, uncontrolled with complications ([place]) 50/93/2671   Hemochromatosis 02/14/2011   Actinic keratosis 02/14/2011   Hypertriglyceridemia 06/13/2010   HEMORRHOIDS, EXTERNAL 03/21/2010   History of aortic valve replacement 10/13/2009   ADENOCARCINOMA, PROSTATE 06/17/2009   TOBACCO USER 06/16/2009   HOH (hard of hearing) 06/16/2009   Essential hypertension 06/16/2009   CORONARY ATHEROSCLEROSIS NATIVE CORONARY ARTERY 06/15/2009   ERECTILE DYSFUNCTION, ORGANIC 05/18/2009   Hypertrophic obstructive cardiomyopathy ([place]) 05/06/2008   OBESITY, MODERATE 04/29/2008    Past Surgical History:  Procedure Laterality Date   AORTIC VALVE REPLACEMENT  11/14/2003   [facility]   CORONARY  ARTERY BYPASS GRAFT  10/2003   HERNIA REPAIR  1999   right, inguinal   HERNIA REPAIR  2002   left, inguinal   HIP SURGERY  2006   right hip   PILONIDAL CYST EXCISION  1964   prostate seed implant  3/12   TONSILLECTOMY  childhood       Home Medications    Prior to Admission medications   Medication Sig Start Date End Date Taking? Authorizing Provider  predniSONE (DELTASONE) 20 MG tablet Take 1 pill twice a day for 3 days, then 1 pill a day till gone 04/05/20  Yes Raylene Everts, MD  AMBULATORY NON FORMULARY MEDICATION cpap  cushions            AirFit F20 (Size: Large)           AirFit F30 (Size: Med)  Please FAX the prescription to:  [phone] 02/10/20   [name], NP  aspirin EC 81 MG tablet Take 1 tablet (81 mg total) by mouth daily. 06/20/17   Lelon Perla, MD  atorvastatin (LIPITOR) 40 MG tablet Take 1 tablet (40 mg total) by mouth daily. 12/03/19   Lelon Perla, MD  betamethasone dipropionate (DIPROLENE) 0.05 % cream Apply topically 2 (two) times daily as needed. To eczema rash 09/06/18   [name], NP  Blood Glucose Monitoring Suppl Supplies MISC Use for monitoring glucose level 08/05/18   [name], NP  colchicine 0.6 MG tablet Take 2 tabs by mouth now and then 1 tab in 1 hour for gout. 02/18/18   [name], NP  Dulaglutide (TRULICITY) 3 GQ/9.1QX SOPN Inject 3 mg as directed once a week. 02/03/20   Shamleffer, Melanie Crazier, MD  fenofibrate (TRICOR) 145 MG tablet TAKE 1 TABLET BY MOUTH EVERY DAY 11/13/19   [name], NP  furosemide (LASIX) 20 MG tablet Take 1 tablet (20 mg total) by mouth daily as needed. For swelling 03/24/20   [name], NP  imiquimod [place]) 5 % cream Apply topically 3 (three) times a week.    [provider]  Insulin Glargine (BASAGLAR KWIKPEN) 100 UNIT/ML Inject 38 Units into the skin daily. 02/03/20   Shamleffer, Melanie Crazier, MD  Insulin Pen Needle 32G X 4 MM MISC 1 Device by Does not apply route as directed. 02/03/20   Shamleffer, Melanie Crazier, MD  metoprolol succinate (TOPROL-XL) 25 MG 24 hr tablet TAKE 1 TABLET BY MOUTH EVERY DAY 12/29/19   [name], NP  Na Sulfate-K Sulfate-Mg Sulf (SUPREP BOWEL PREP KIT) 17.5-3.13-1.6 GM/177ML SOLN Take 1 kit by mouth as directed. 03/30/20   Milus Banister, MD  OneTouch Delica Lancets 45W MISC USE AS DIRECTED 02/13/19   [name], NP  [facility] VERIO test strip USE AS INSTRUCTED TO CHECK BLOOD SUGAR TWICE A DAY. DX E11.8 03/03/19   [name],  NP  tiZANidine (ZANAFLEX) 4 MG tablet Take 1 tablet (4 mg total) by mouth every 6 (six) hours as needed for muscle spasms. 03/29/20   Faustino Congress, NP  warfarin (COUMADIN) 7.5 MG tablet TAKE 1/2 TO 1 TABLET DAILY AS DIRECTED BY COUMADIN CLINIC 08/18/19   Lelon Perla, MD    Family History Family History  Problem Relation Age of Onset   Heart disease Mother    Stroke Mother    Heart disease Father    Asperger's syndrome Son    Hyperlipidemia Son    Coronary artery disease Brother    Cancer Neg Hx  negative for colon cancer    Social History Social History   Tobacco Use   Smoking status: Light Tobacco Smoker    Types: Cigars   Smokeless tobacco: Never Used  Substance Use Topics   Alcohol use: Yes    Alcohol/week: 0.0 standard drinks    Comment: 4 glasses wine a month   Drug use: No     Allergies   Patient has no known allergies.   Review of Systems Review of Systems See HPI  Physical Exam Triage Vital Signs ED Triage Vitals  Enc Vitals Group     BP 04/05/20 1722 (!) 146/74     Pulse Rate 04/05/20 1722 74     Resp 04/05/20 1722 20     Temp 04/05/20 1722 98.9 F (37.2 C)     Temp Source 04/05/20 1722 Oral     SpO2 04/05/20 1722 96 %     Weight 04/05/20 1720 214 lb (97.1 kg)     Height 04/05/20 1720 5' 9.5" (1.765 m)     Head Circumference --      Peak Flow --      Pain Score 04/05/20 1719 1     Pain Loc --      Pain Edu? --      Excl. in [place]? --    No data found.  Updated Vital Signs BP (!) 146/74 (BP Location: Left Arm)    Pulse 74    Temp 98.9 F (37.2 C) (Oral)    Resp 20    Ht 5' 9.5" (1.765 m)    Wt 97.1 kg    SpO2 96%    BMI 31.15 kg/m      Physical Exam Constitutional:      General: He is not in acute distress.    Appearance: He is well-developed and well-nourished.     Comments: Pleasant.  Mild overweight.  Hard of hearing  HENT:     Head: Normocephalic and atraumatic.     Mouth/Throat:     Mouth: Oropharynx is  clear and moist.     Comments: Mask in place Eyes:     Conjunctiva/sclera: Conjunctivae normal.     Pupils: Pupils are equal, round, and reactive to light.  Cardiovascular:     Rate and Rhythm: Normal rate.  Pulmonary:     Effort: Pulmonary effort is normal. No respiratory distress.  Abdominal:     General: There is no distension.     Palpations: Abdomen is soft.  Musculoskeletal:        General: No edema. Normal range of motion.     Cervical back: Normal range of motion.     Comments: No tenderness in the lumbar spine.  Mild limitation of lumbar mobility.  I did measure the circumference of each thigh because he thought his left thigh was bigger, and they both measure 53 cm mid thigh.  There is no palpable abnormality.  He states that his leg on the left is tender to even light touch in the lateral thigh region.  Reflexes are trace but equal at the knee and ankle.  Straight leg raise on the right causes increased pain on the left, interestingly.  Skin:    General: Skin is warm and dry.  Neurological:     Mental Status: He is alert.  Psychiatric:        Mood and Affect: Mood normal.        Behavior: Behavior normal.      UC Treatments / Results  Labs (all labs ordered are listed, but only abnormal results are displayed) Labs Reviewed - No data to display  EKG   Radiology No results found.  Procedures Procedures (including critical care time)  Medications Ordered in UC Medications - No data to display  Initial Impression / Assessment and Plan / UC Course  I have reviewed the triage vital signs and the nursing notes.  Pertinent labs & imaging results that were available during my care of the patient were reviewed by me and considered in my medical decision making (see chart for details).     I am uncertain what is causing this man's left thigh pain of 1 month.  His physical examination is confusing.  I do believe it is likely a radiculopathy.  We will going to treat  him with steroids even though he is an insulin-dependent diabetic with poor control.  I did give him strict precautions to watch his sugars.  He will follow-up with his primary care doctor.  He may need imaging if he fails to improve Final Clinical Impressions(s) / UC Diagnoses   Final diagnoses:  Left leg pain     Discharge Instructions     Keep activity within your pain limits.  Avoid activity that increases your leg pain Try ice or heat to the leg area Take prednisone as directed Be careful to watch your diet and your sugars Call your primary care doctor if not improving in a couple days Take Tylenol 500 mg, 2 pills 3 times a day with food   ED Prescriptions    Medication Sig Dispense Auth. Provider   predniSONE (DELTASONE) 20 MG tablet Take 1 pill twice a day for 3 days, then 1 pill a day till gone 12 tablet Meda Coffee Jennette Banker, MD     PDMP not reviewed this encounter.   Raylene Everts, MD 04/05/20 [place]

## 2020-04-05 NOTE — Discharge Instructions (Signed)
Keep activity within your pain limits.  Avoid activity that increases your leg pain Try ice or heat to the leg area Take prednisone as directed Be careful to watch your diet and your sugars Call your primary care doctor if not improving in a couple days Take Tylenol 500 mg, 2 pills 3 times a day with food

## 2020-04-05 NOTE — ED Triage Notes (Signed)
Pt presents to Urgent Care with c/o L thigh pain (and a "knot") x several weeks. Has seen his PCP for this and was also seen here on 03/29/20, evaluated by S. Zigmund Daniel, NP. States he has been taking Zanaflex as prescribed w/o relief.

## 2020-04-06 NOTE — Telephone Encounter (Signed)
It looks like cardiology stopped his olmesartan back in November because his blood pressure was low.  Is he still taking it?

## 2020-04-06 NOTE — Telephone Encounter (Signed)
Patient reports he is not taking olmesartan.   He also wanted to let pcp know he is taking prednisone by urgent care for his leg pain.

## 2020-04-13 ENCOUNTER — Encounter: Payer: Self-pay | Admitting: Internal Medicine

## 2020-04-13 ENCOUNTER — Encounter: Payer: Self-pay | Admitting: Family

## 2020-04-13 DIAGNOSIS — G4733 Obstructive sleep apnea (adult) (pediatric): Secondary | ICD-10-CM | POA: Diagnosis not present

## 2020-04-13 MED ORDER — COLCHICINE 0.6 MG PO TABS: ORAL_TABLET | ORAL | 1 refills | Status: DC

## 2020-04-14 ENCOUNTER — Telehealth: Payer: Self-pay

## 2020-04-14 NOTE — Telephone Encounter (Signed)
I spoke to the patient and he told me that his gout was improving and that he had completed his Prednisone.  He will keep his upcoming Coumadin appointment in 2 weeks.  He verbalized understanding

## 2020-04-16 ENCOUNTER — Encounter: Payer: Self-pay | Admitting: Family

## 2020-04-19 ENCOUNTER — Encounter: Payer: Self-pay | Admitting: Family

## 2020-04-19 NOTE — Telephone Encounter (Signed)
Patient scheduled to come in tomorrow  

## 2020-04-20 ENCOUNTER — Encounter: Payer: Self-pay | Admitting: Family

## 2020-04-20 ENCOUNTER — Ambulatory Visit (INDEPENDENT_AMBULATORY_CARE_PROVIDER_SITE_OTHER): Payer: Medicare HMO | Admitting: Family

## 2020-04-20 ENCOUNTER — Other Ambulatory Visit: Payer: Self-pay

## 2020-04-20 VITALS — BP 130/65 | HR 87 | Temp 98.3°F | Resp 16 | Wt 203.0 lb

## 2020-04-20 DIAGNOSIS — H9190 Unspecified hearing loss, unspecified ear: Secondary | ICD-10-CM

## 2020-04-20 DIAGNOSIS — M5416 Radiculopathy, lumbar region: Secondary | ICD-10-CM

## 2020-04-20 DIAGNOSIS — G4733 Obstructive sleep apnea (adult) (pediatric): Secondary | ICD-10-CM | POA: Diagnosis not present

## 2020-04-20 MED ORDER — GABAPENTIN 100 MG PO CAPS: 100.0000 mg | ORAL_CAPSULE | Freq: Three times a day (TID) | ORAL | 3 refills | Status: DC | PRN

## 2020-04-20 NOTE — Progress Notes (Signed)
Subjective:    Patient ID: [name], male    DOB: [birth year], 75 y.o.   MRN: [id number]  HPI  Patient is a 75 yr old male who presents today to discuss left leg pain. States that since last visit his pain has been severe at times and has interfered with his sleep.   He went to the ED on 03/29/20 with c/o left leg pain x 10 days. He was diagnosed with muscle strain and treated with tizanidine and tylenol.   He returned to urgent care on 04/05/20 with same complaint. It was felt that his pain was most likely radiculopathy and he was treated with steroids.  Notes that he has had significant improvement in his pain.    He reports some mild numbness in the left medial shin.    Wt Readings from Last 3 Encounters:  04/20/20 203 lb (92.1 kg)  04/05/20 214 lb (97.1 kg)  03/30/20 218 lb (98.9 kg)   He had a fall on Sunday at church.  Reports that the left knee went out from under him. Has not been sleeping well since that time.  He is using a walker since Sunday as he is afraid of falling.   Review of Systems    see HPI  Past Medical History:  Diagnosis Date  . Allergy   . Aortic stenosis   . CAD (coronary artery disease)    cabg  . Cancer [facility]) 2011   prostate  . Erectile dysfunction   . Heart murmur   . Hemochromatosis 02/14/2011  . Hemorrhoids   . History of gout   . History of hepatitis 1974  . Hyperlipidemia   . Hypertension   . Hypertr obst cardiomyop   . Obesity    moderate  . Sensorineural hearing loss   . Type II or unspecified type diabetes mellitus without mention of complication, not stated as uncontrolled      Social History   Socioeconomic History  . Marital status: Married    Spouse name: Not on file  . Number of children: Not on file  . Years of education: Not on file  . Highest education level: Not on file  Occupational History  . Not on file  Tobacco Use  . Smoking status: Light Tobacco Smoker    Types: Cigars  . Smokeless tobacco: Never  Used  Substance and Sexual Activity  . Alcohol use: Yes    Alcohol/week: 0.0 standard drinks    Comment: 4 glasses wine a month  . Drug use: No  . Sexual activity: Never  Other Topics Concern  . Not on file  Social History Narrative  . Not on file   Social Determinants of Health   Financial Resource Strain: Not on file  Food Insecurity: Not on file  Transportation Needs: Not on file  Physical Activity: Not on file  Stress: Not on file  Social Connections: Not on file  Intimate Partner Violence: Not on file    Past Surgical History:  Procedure Laterality Date  . AORTIC VALVE REPLACEMENT  11/14/2003   [facility]  . APPENDECTOMY  1990  . CHOLECYSTECTOMY  1990  . CORONARY ARTERY BYPASS GRAFT  10/2003  . HERNIA REPAIR  1999   right, inguinal  . HERNIA REPAIR  2002   left, inguinal  . HIP SURGERY  2006   right hip  . PILONIDAL CYST EXCISION  1964  . prostate seed implant  3/12  . TONSILLECTOMY  childhood  Family History  Problem Relation Age of Onset  . Heart disease Mother   . Stroke Mother   . Heart disease Father   . Asperger's syndrome Son   . Hyperlipidemia Son   . Coronary artery disease Brother   . Cancer Neg Hx        negative for colon cancer    No Known Allergies  Current Outpatient Medications on File Prior to Visit  Medication Sig Dispense Refill  . AMBULATORY NON FORMULARY MEDICATION cpap cushions            AirFit F20 (Size: Large)           AirFit F30 (Size: Med)  Please FAX the prescription to:  [phone] 12 each prn  . aspirin EC 81 MG tablet Take 1 tablet (81 mg total) by mouth daily. 90 tablet 3  . atorvastatin (LIPITOR) 40 MG tablet Take 1 tablet (40 mg total) by mouth daily. 90 tablet 3  . betamethasone dipropionate (DIPROLENE) 0.05 % cream Apply topically 2 (two) times daily as needed. To eczema rash 30 g 1  . Blood Glucose Monitoring Suppl Supplies MISC Use for monitoring glucose level 100 each 1  . colchicine 0.6 MG  tablet Take 2 tabs by mouth now and then 1 tab in 1 hour for gout. (max 3 tabs/24 hrs) 9 tablet 1  . Dulaglutide (TRULICITY) 3 HU/7.6LY SOPN Inject 3 mg as directed once a week. 2 mL 6  . fenofibrate (TRICOR) 145 MG tablet TAKE 1 TABLET BY MOUTH EVERY DAY 90 tablet 1  . furosemide (LASIX) 20 MG tablet Take 1 tablet (20 mg total) by mouth daily as needed. For swelling 90 tablet 0  . imiquimod (ALDARA) 5 % cream Apply topically 3 (three) times a week.    . Insulin Glargine (BASAGLAR KWIKPEN) 100 UNIT/ML Inject 38 Units into the skin daily. 30 mL 4  . Insulin Pen Needle 32G X 4 MM MISC 1 Device by Does not apply route as directed. 100 each 3  . metoprolol succinate (TOPROL-XL) 25 MG 24 hr tablet TAKE 1 TABLET BY MOUTH EVERY DAY 90 tablet 1  . Na Sulfate-K Sulfate-Mg Sulf (SUPREP BOWEL PREP KIT) 17.5-3.13-1.6 GM/177ML SOLN Take 1 kit by mouth as directed. 324 mL 0  . OneTouch Delica Lancets 65K MISC USE AS DIRECTED 100 each 1  . ONETOUCH VERIO test strip USE AS INSTRUCTED TO CHECK BLOOD SUGAR TWICE A DAY. DX E11.8 100 strip 12  . warfarin (COUMADIN) 7.5 MG tablet TAKE 1/2 TO 1 TABLET DAILY AS DIRECTED BY COUMADIN CLINIC 90 tablet 1   No current facility-administered medications on file prior to visit.    BP 130/65 (BP Location: Right Arm, Patient Position: Sitting, Cuff Size: Large)   Pulse 87   Temp 98.3 F (36.8 C) (Oral)   Resp 16   Wt 203 lb (92.1 kg)   SpO2 99%   BMI 29.55 kg/m    Objective:   Physical Exam Constitutional:      General: He is not in acute distress.    Appearance: He is well-developed.  HENT:     Head: Normocephalic and atraumatic.  Cardiovascular:     Rate and Rhythm: Normal rate and regular rhythm.     Heart sounds: Murmur (click noted from heart valve) heard.    Pulmonary:     Effort: Pulmonary effort is normal. No respiratory distress.     Breath sounds: Normal breath sounds. No wheezing or rales.  Musculoskeletal:  General: Swelling (2+ bilateral  LE edema) present.  Skin:    General: Skin is warm and dry.  Neurological:     Mental Status: He is alert and oriented to person, place, and time.     Deep Tendon Reflexes:     Reflex Scores:      Patellar reflexes are 2+ on the right side and 2+ on the left side.    Comments: Decreased sensation left anterior thigh and shin  Psychiatric:        Behavior: Behavior normal.        Thought Content: Thought content normal.           Assessment & Plan:  Lumbar radiculopathy-with ongoing pain and recent fall,  I would like to obtain an MRI of his lumbar spine. He will send me a copy of his [facility] [facility] card so I can confirm that his valve is MRI compatible. Plan to treat pain with gabapentin 163m tid.    This visit occurred during the SARS-CoV-2 public health emergency.  Safety protocols were in place, including screening questions prior to the visit, additional usage of staff PPE, and extensive cleaning of exam room while observing appropriate contact time as indicated for disinfecting solutions.

## 2020-04-20 NOTE — Patient Instructions (Signed)
Please send me a photo of your [employer] Jude card so I can put those details in the MRI order.  Start gabapentin 100mg  3 x daily.

## 2020-04-22 ENCOUNTER — Encounter: Payer: Self-pay | Admitting: Internal Medicine

## 2020-04-22 DIAGNOSIS — M5416 Radiculopathy, lumbar region: Secondary | ICD-10-CM

## 2020-04-22 MED ORDER — TRULICITY 3 MG/0.5ML ~~LOC~~ SOAJ: 3.0000 mg | SUBCUTANEOUS | 6 refills | Status: DC

## 2020-04-22 NOTE — Telephone Encounter (Signed)
Patient's wife called re: Patient had an issue with Trulicity (Pen broke) and requests a new RX to be sent asapfor the following:  MEDICATION:  Dulaglutide (TRULICITY) 3 RD/4.0CX SOPN  PHARMACY:   [id number] - [place], [place] - [address] AT [place] Phone:  [phone]  Fax:  [phone]       HAS THE PATIENT CONTACTED [place]? Yes   IS THIS A 90 DAY SUPPLY : If posible  IS PATIENT OUT OF MEDICATION: Yes  IF NOT; HOW MUCH IS LEFT: 0  LAST APPOINTMENT DATE: @3 /22/2022  NEXT APPOINTMENT DATE:@4 /19/2022  DO WE HAVE YOUR PERMISSION TO LEAVE A DETAILED MESSAGE?: Yes  OTHER COMMENTS:    **Let patient know to contact pharmacy at the end of the day to make sure medication is ready. **  ** Please notify patient to allow 48-72 hours to process**  **Encourage patient to contact the pharmacy for refills or they can request refills through [facility]**

## 2020-04-22 NOTE — Telephone Encounter (Signed)
Sent as requested.

## 2020-04-22 NOTE — Addendum Note (Signed)
Addended by: Jacqualin Combes on: 04/22/2020 10:40 AM   Modules accepted: Orders

## 2020-04-27 ENCOUNTER — Encounter: Payer: Self-pay | Admitting: Family

## 2020-04-28 ENCOUNTER — Other Ambulatory Visit: Payer: Self-pay

## 2020-04-28 ENCOUNTER — Ambulatory Visit (INDEPENDENT_AMBULATORY_CARE_PROVIDER_SITE_OTHER): Payer: Medicare HMO

## 2020-04-28 DIAGNOSIS — Z5181 Encounter for therapeutic drug level monitoring: Secondary | ICD-10-CM | POA: Diagnosis not present

## 2020-04-28 DIAGNOSIS — Z952 Presence of prosthetic heart valve: Secondary | ICD-10-CM

## 2020-04-28 DIAGNOSIS — I359 Nonrheumatic aortic valve disorder, unspecified: Secondary | ICD-10-CM | POA: Diagnosis not present

## 2020-04-28 DIAGNOSIS — Z7901 Long term (current) use of anticoagulants: Secondary | ICD-10-CM

## 2020-04-28 LAB — POCT INR: INR: 2.5 (ref 2.0–3.0)

## 2020-04-28 NOTE — Patient Instructions (Signed)
Continue with 1 tablet each Monday, Wednesday and Friday, 1/2 tablet all other days.  Repeat INR in 4 weeks;  Colonoscopy 5/4;  Hold Coumadin 4/29,30,5/1,5/2,5/3;

## 2020-04-28 NOTE — Telephone Encounter (Signed)
[name],   Could you please see if there is an earlier appointment for MRI at [facility]?   Thanks,  [employer]

## 2020-04-30 ENCOUNTER — Other Ambulatory Visit: Payer: Self-pay

## 2020-04-30 MED ORDER — WARFARIN SODIUM 7.5 MG PO TABS: ORAL_TABLET | ORAL | 2 refills | Status: DC

## 2020-04-30 NOTE — Telephone Encounter (Signed)
Patient would like to know if you received a fax from [facility] (Denial of procedure) - MRI

## 2020-05-03 ENCOUNTER — Telehealth: Payer: Self-pay

## 2020-05-03 NOTE — Telephone Encounter (Signed)
Called patient to let him know again, we received the paperwork from insurance requesting a peer to peer Friday evening. This was given to provider today.  As soon as we hear from insurance if MRI is approved or not we will contact him to let him know. He verbalized understanding.

## 2020-05-04 ENCOUNTER — Telehealth: Payer: Self-pay | Admitting: Family

## 2020-05-04 DIAGNOSIS — M5416 Radiculopathy, lumbar region: Secondary | ICD-10-CM

## 2020-05-04 NOTE — Telephone Encounter (Signed)
Please advise pt that his insurance is requiring him to see Physical therapy before they will consider approving MRI. I have placed the referral.

## 2020-05-04 NOTE — Telephone Encounter (Signed)
Patient advised of insurance requirements and referral to PT. He was advised they will contact him for appointment. He verbalized understanding.

## 2020-05-09 ENCOUNTER — Other Ambulatory Visit: Payer: Self-pay | Admitting: Family

## 2020-05-12 ENCOUNTER — Ambulatory Visit: Payer: Medicare HMO | Attending: Family | Admitting: Physical Therapy

## 2020-05-12 ENCOUNTER — Other Ambulatory Visit: Payer: Self-pay

## 2020-05-12 ENCOUNTER — Encounter: Payer: Self-pay | Admitting: Physical Therapy

## 2020-05-12 DIAGNOSIS — M5416 Radiculopathy, lumbar region: Secondary | ICD-10-CM | POA: Diagnosis not present

## 2020-05-12 DIAGNOSIS — M25552 Pain in left hip: Secondary | ICD-10-CM | POA: Diagnosis not present

## 2020-05-12 DIAGNOSIS — R29898 Other symptoms and signs involving the musculoskeletal system: Secondary | ICD-10-CM | POA: Diagnosis not present

## 2020-05-12 NOTE — Therapy (Signed)
[facility] [address]  [place], [place], [address] Phone: [phone]   Fax:  [phone]  Physical Therapy Evaluation  Patient Details  Name: [name] MRN: [id number] Date of Birth: 1945/07/19 Referring Provider (PT): [name], NP   Encounter Date: 05/12/2020   PT End of Session - 05/12/20 1200    Visit Number 1    Number of Visits 13    Date for PT Re-Evaluation 06/23/20    Authorization Type [employer] Medicare    PT Start Time 1020    PT Stop Time 1102    PT Time Calculation (min) 42 min    Activity Tolerance Patient tolerated treatment well    Behavior During Therapy [facility] for tasks assessed/performed           Past Medical History:  Diagnosis Date  . Allergy   . Aortic stenosis   . CAD (coronary artery disease)    cabg  . Cancer [facility]) 2011   prostate  . Erectile dysfunction   . Heart murmur   . Hemochromatosis 02/14/2011  . Hemorrhoids   . History of gout   . History of hepatitis 1974  . Hyperlipidemia   . Hypertension   . Hypertr obst cardiomyop   . Obesity    moderate  . Sensorineural hearing loss   . Type II or unspecified type diabetes mellitus without mention of complication, not stated as uncontrolled     Past Surgical History:  Procedure Laterality Date  . AORTIC VALVE REPLACEMENT  11/14/2003   [facility]  . APPENDECTOMY  1990  . CHOLECYSTECTOMY  1990  . CORONARY ARTERY BYPASS GRAFT  10/2003  . HERNIA REPAIR  1999   right, inguinal  . HERNIA REPAIR  2002   left, inguinal  . HIP SURGERY  2006   right hip  . PILONIDAL CYST EXCISION  1964  . prostate seed implant  3/12  . TONSILLECTOMY  childhood    There were no vitals filed for this visit.    Subjective Assessment - 05/12/20 1021    Subjective Patient reporting LBP since the end of February. Wife suggests that a long care ride without breaks could have triggered it. Pain is located over the L anterior  hip and knee. Also notes N/T down the anterior shin to the ankle. Notes minimal LBP. Pain worse with laying flat in supine, turning, standing up out of a chair, trouble with reciprocal navigation of stairs on the L LE. Better with prednisone and walking. 3 weeks ago his L knee buckled and had a LOB, now using a RW with ambulation for safety.    Patient is accompained by: Family member   wife   Pertinent History DMII, hearing loss, obesity, HTN, HLD, gout, prostate CA 2011, CAD, CABG 2005, R hip surgery 2006    Limitations Lifting;Standing;House hold activities    How long can you sit comfortably? unlimited    How long can you stand comfortably? reports getting tired from turning while in the kitchen; 10 min    Diagnostic tests none recent- waiting for MRI authorization    Patient Stated Goals decrease pain    Currently in Pain? Yes    Pain Score 1     Pain Location Knee    Pain Orientation Left    Pain Descriptors / Indicators Throbbing    Pain Type Acute pain              [facility]  PT Assessment - 05/12/20 1029      Assessment   Medical Diagnosis Lumbar Radiculopathy    Referring Provider (PT) [name], NP    Onset Date/Surgical Date 03/29/20    Next MD Visit 06/02/20    Prior Therapy no      Precautions   Precautions Fall   hearing loss, hx prostate CA; long term use of blood thinners     Balance Screen   Has the patient fallen in the past 6 months Yes    How many times? 1    Has the patient had a decrease in activity level because of a fear of falling?  No    Is the patient reluctant to leave their home because of a fear of falling?  No      Home Ecologist residence    Living Arrangements Spouse/significant other    Available Help at Discharge Family    Type of [place] to enter    Entrance Stairs-Number of Steps 1   additional steps in/out of garage and other rooms   Entrance [place] One level    [place] - 2 wheels      Prior Function   Level of [place] Retired    Leisure cooking      Cognition   Overall Cognitive Status Within Functional Limits for tasks assessed      Sensation   Light Touch Impaired by gross assessment   c/o decreased sensation in L shin     Coordination   Gross Motor Movements are Fluid and Coordinated Yes      Posture/Postural Control   Posture/Postural Control Postural limitations    Postural Limitations Rounded Shoulders;Forward head      ROM / Strength   AROM / PROM / Strength AROM;Strength      AROM   AROM Assessment Site Lumbar;Hip    Right/Left Hip Right;Left    Right Hip External Rotation  35    Right Hip Internal Rotation  39    Left Hip External Rotation  28    Left Hip Internal Rotation  30    Lumbar Flexion toes    Lumbar Extension moderately limited   mild LBP   Lumbar - Right Side Bend mid thigh   mild discomfort in LB and L anterior hip   Lumbar - Left Side Bend mid thigh   mild discomfort in LB and L anterior hip   Lumbar - Right Rotation mild-moderately limited    Lumbar - Left Rotation mild-moderately limited   mild L hip pain     Strength   Strength Assessment Site Hip;Knee;Ankle    Right/Left Hip Right;Left    Right Hip Flexion 4+/5    Right Hip ABduction 4+/5    Right Hip ADduction 4+/5    Left Hip Flexion 4/5    Left Hip ABduction 4+/5    Right/Left Knee Right;Left    Right Knee Flexion 4+/5    Right Knee Extension 4+/5    Left Knee Flexion 4+/5    Left Knee Extension 4/5    Right/Left Ankle Right;Left    Right Ankle Dorsiflexion 4+/5    Right Ankle Plantar Flexion 4+/5    Left Ankle Dorsiflexion 4+/5    Left Ankle Plantar Flexion 4+/5      Flexibility   Soft Tissue Assessment /Muscle Length yes  Hamstrings L severely tight, R WFL    Quadriceps L moderately tight, R WFL    Piriformis L severely tight in fig 4 and KTOS, R moderately tight       Palpation   Palpation comment no TTP in LB or buttocks, knee; TTP over L hip flexor      Ambulation/Gait   Gait Pattern Step-through pattern;Trunk flexed    Ambulation Surface Level;Indoor    Gait velocity slightly decreased                      Objective measurements completed on examination: See above findings.               PT Education - 05/12/20 1159    Education Details prognosis, POC, HEP    Person(s) Educated Patient;Spouse    Methods Explanation;Demonstration;Tactile cues;Verbal cues;Handout    Comprehension Verbalized understanding;Returned demonstration            PT Short Term Goals - 05/12/20 1205      PT SHORT TERM GOAL #1   Title Patient to be independent with initial HEP.    Time 3    Period Weeks    Status New    Target Date 06/02/20             PT Long Term Goals - 05/12/20 1206      PT LONG TERM GOAL #1   Title Patient to be independent with advanced HEP.    Time 6    Period Weeks    Status New    Target Date 06/23/20      PT LONG TERM GOAL #2   Title Patient to demonstrate B LE strength >/=4+/5.    Time 6    Period Weeks    Status New    Target Date 06/23/20      PT LONG TERM GOAL #3   Title Patient to demonstrate lumbar AROM WFL and without pain limiting.    Time 6    Period Weeks    Status New    Target Date 06/23/20      PT LONG TERM GOAL #4   Title Patient to ascend/descend 13 steps with 1 handrail as needed with reciprocal pattern and good stability.    Time 6    Period Weeks    Status New    Target Date 06/23/20      PT LONG TERM GOAL #5   Title Patient to report tolerance for supine positioning without pain limiting.    Time 6    Period Weeks    Status New    Target Date 06/23/20                  Plan - 05/12/20 1200    Clinical Impression Statement Patient is a 75 y/o M presenting to [place] with wife for assistance with subjective report d/t HOH, with c/o acute L LE pain for the  past 1.5 months. Pain has improved since initial onset, but still occurs mildly in the LB as well as the L anterior hip and anterior knee. Notes N/T in the L anterior shin to the ankle. Aggravating factors include supine positioning, turning, STS, and reciprocal stair navigation. Notes a near fall 3 weeks ago d/t the L knee buckling; now ambulating with RW for safety. Patient today presenting with rounded shoulders and forward head posture, limited L hip AROM, limited and painful lumbar AROM, L hip flexion and knee extension weakness, severe tightness  in L LE musculature, TTP over L hip flexors, and gait deviations. Patient was educated on gentle stretching and mobility HEP- patient reported understanding. Would benefit from skilled PT services 2x/week for 6 weeks to address aforementioned impairments.    Personal Factors and Comorbidities Age;Comorbidity 3+;Fitness;Past/Current Experience;Time since onset of injury/illness/exacerbation    Comorbidities DMII, hearing loss, obesity, HTN, HLD, gout, prostate CA 2011, CAD, CABG 2005, R hip surgery 2006    Examination-Activity Limitations Bend;Squat;Sleep;Bed Mobility;Stairs;Stand;Carry;Toileting;Dressing;Transfers;Hygiene/Grooming;Lift;Locomotion Level;Reach Overhead    Examination-Participation Restrictions Church;Cleaning;Shop;Community Activity;Interpersonal Relationship;Driving;Meal Prep;Laundry    Stability/Clinical Decision Making Evolving/Moderate complexity    Clinical Decision Making Moderate    Rehab Potential Good    PT Frequency 2x / week    PT Duration 6 weeks    PT Treatment/Interventions ADLs/Self Care Home Management;Cryotherapy;Electrical Stimulation;Iontophoresis 4mg /ml Dexamethasone;Moist Heat;Traction;Balance training;Therapeutic exercise;Therapeutic activities;Functional mobility training;Stair training;Gait training;Ultrasound;Neuromuscular re-education;Patient/family education;Manual techniques;Vasopneumatic Device;Taping;Energy  conservation;Dry needling;Passive range of motion    PT Next Visit Plan hip FOTO; reassess HEP; progress hip flexor and quad strength; hip mobility    Consulted and Agree with Plan of Care Patient           Patient will benefit from skilled therapeutic intervention in order to improve the following deficits and impairments:  Hypomobility,Pain,Increased fascial restricitons,Decreased strength,Decreased activity tolerance,Difficulty walking,Increased muscle spasms,Improper body mechanics,Decreased range of motion,Impaired flexibility,Postural dysfunction  Visit Diagnosis: Radiculopathy, lumbar region  Pain in left hip  Other symptoms and signs involving the musculoskeletal system     Problem List Patient Active Problem List   Diagnosis Date Noted  . Type 2 diabetes mellitus with hyperglycemia, with long-term current use of insulin ([place]) 11/11/2018  . Type 2 diabetes mellitus with diabetic polyneuropathy, with long-term current use of insulin ([place]) 11/11/2018  . Type 2 diabetes mellitus with stage 3 chronic kidney disease, with long-term current use of insulin ([place]) 11/11/2018  . Diabetes mellitus ([place]) 11/11/2018  . Hx of CABG 11/05/2018  . Dyslipidemia 11/05/2018  . Primary osteoarthritis of left shoulder 10/31/2018  . Long term (current) use of anticoagulants 05/22/2018  . Hereditary hemochromatosis ([place]) 03/30/2016  . Gout 04/09/2015  . Ventral hernia 03/03/2014  . History of prostate cancer 11/28/2013  . OSA (obstructive sleep apnea) 07/12/2012  . DM (diabetes mellitus), type 2, uncontrolled with complications ([place]) 73/71/0626  . Hemochromatosis 02/14/2011  . Actinic keratosis 02/14/2011  . Hypertriglyceridemia 06/13/2010  . HEMORRHOIDS, EXTERNAL 03/21/2010  . History of aortic valve replacement 10/13/2009  . ADENOCARCINOMA, PROSTATE 06/17/2009  . TOBACCO USER 06/16/2009  . HOH (hard of hearing) 06/16/2009  . Essential hypertension 06/16/2009  . CORONARY  ATHEROSCLEROSIS NATIVE CORONARY ARTERY 06/15/2009  . ERECTILE DYSFUNCTION, ORGANIC 05/18/2009  . Hypertrophic obstructive cardiomyopathy ([place]) 05/06/2008  . OBESITY, MODERATE 04/29/2008     Janene Harvey, PT, DPT 05/12/20 12:09 PM   [facility] [address]  [place], [place], [address] Phone: [phone]   Fax:  [phone]  Name: [name] MRN: [id number] Date of Birth: Aug 12, 1945

## 2020-05-14 ENCOUNTER — Other Ambulatory Visit: Payer: Self-pay

## 2020-05-14 ENCOUNTER — Encounter: Payer: Self-pay | Admitting: Physical Therapy

## 2020-05-14 ENCOUNTER — Ambulatory Visit: Payer: Medicare HMO | Admitting: Physical Therapy

## 2020-05-14 DIAGNOSIS — R29898 Other symptoms and signs involving the musculoskeletal system: Secondary | ICD-10-CM

## 2020-05-14 DIAGNOSIS — M5416 Radiculopathy, lumbar region: Secondary | ICD-10-CM | POA: Diagnosis not present

## 2020-05-14 DIAGNOSIS — M25552 Pain in left hip: Secondary | ICD-10-CM

## 2020-05-14 DIAGNOSIS — G4733 Obstructive sleep apnea (adult) (pediatric): Secondary | ICD-10-CM | POA: Diagnosis not present

## 2020-05-14 NOTE — Therapy (Signed)
[facility] [address]  [place], [place], [address] Phone: [phone]   Fax:  [phone]  Physical Therapy Treatment  Patient Details  Name: [name] MRN: [id number] Date of Birth: [birth year] Referring Provider (PT): [name], NP   Encounter Date: 05/14/2020   PT End of Session - 05/14/20 1013    Visit Number 2    Number of Visits 13    Date for PT Re-Evaluation 06/23/20    Authorization Type [employer]    PT Start Time [phone]    PT Stop Time 1013    PT Time Calculation (min) 39 min    Activity Tolerance Patient tolerated treatment well    Behavior During Therapy [facility] for tasks assessed/performed           Past Medical History:  Diagnosis Date  . Allergy   . Aortic stenosis   . CAD (coronary artery disease)    cabg  . Cancer [facility]) 2011   prostate  . Erectile dysfunction   . Heart murmur   . Hemochromatosis 02/14/2011  . Hemorrhoids   . History of gout   . History of hepatitis 1974  . Hyperlipidemia   . Hypertension   . Hypertr obst cardiomyop   . Obesity    moderate  . Sensorineural hearing loss   . Type II or unspecified type diabetes mellitus without mention of complication, not stated as uncontrolled     Past Surgical History:  Procedure Laterality Date  . AORTIC VALVE REPLACEMENT  11/14/2003   [facility]  . APPENDECTOMY  1990  . CHOLECYSTECTOMY  1990  . CORONARY ARTERY BYPASS GRAFT  10/2003  . HERNIA REPAIR  1999   right, inguinal  . HERNIA REPAIR  2002   left, inguinal  . HIP SURGERY  2006   right hip  . PILONIDAL CYST EXCISION  1964  . prostate seed implant  3/12  . TONSILLECTOMY  childhood    There were no vitals filed for this visit.   Subjective Assessment - 05/14/20 0937    Subjective Arrived with questions on HEP and inquiring on if he can return to [employer]. Still sleeping in recliner rather than bed d/t pain .    Pertinent History DMII,  hearing loss, obesity, HTN, HLD, gout, prostate CA 2011, CAD, CABG 2005, R hip surgery 2006    Diagnostic tests none recent- waiting for MRI authorization    Patient Stated Goals decrease pain    Currently in Pain? No/denies              [facility] PT Assessment - 05/14/20 0949      Observation/Other Assessments   Focus on Therapeutic Outcomes (FOTO)  Lumbar: 98                         [facility] Adult PT Treatment/Exercise - 05/14/20 0001      Exercises   Exercises Knee/Hip;Lumbar      Lumbar Exercises: Aerobic   Nustep L3 x 6 min (UEs/LEs)      Lumbar Exercises: Sidelying   Other Sidelying Lumbar Exercises R/L open book stretch 10x each   correction of form/asnwered patient's questions; L hand behind the head on the L d/t shoulder pain     Knee/Hip Exercises: Stretches   Hip Flexor Stretch Left;2 reps;30 seconds;Right    Hip Flexor Stretch Limitations mod thomas with strap   cueing to avoid hip flexion  Piriformis Stretch Right;Left;2 reps;30 seconds    Piriformis Stretch Limitations supine KTOS and fig 4      Knee/Hip Exercises: Supine   Bridges Strengthening;Both;1 set;10 reps    Bridges Limitations L hip instability                  PT Education - 05/14/20 1013    Education Details update to HEP; review of HEP    Person(s) Educated Patient    Methods Explanation;Demonstration;Tactile cues;Verbal cues;Handout    Comprehension Returned demonstration;Verbalized understanding            PT Short Term Goals - 05/14/20 1016      PT SHORT TERM GOAL #1   Title Patient to be independent with initial HEP.    Time 3    Period Weeks    Status On-going    Target Date 06/02/20             PT Long Term Goals - 05/14/20 1016      PT LONG TERM GOAL #1   Title Patient to be independent with advanced HEP.    Time 6    Period Weeks    Status On-going      PT LONG TERM GOAL #2   Title Patient to demonstrate B LE strength >/=4+/5.    Time 6     Period Weeks    Status On-going      PT LONG TERM GOAL #3   Title Patient to demonstrate lumbar AROM WFL and without pain limiting.    Time 6    Period Weeks    Status On-going      PT LONG TERM GOAL #4   Title Patient to ascend/descend 13 steps with 1 handrail as needed with reciprocal pattern and good stability.    Time 6    Period Weeks    Status On-going      PT LONG TERM GOAL #5   Title Patient to report tolerance for supine positioning without pain limiting.    Time 6    Period Weeks    Status On-going                 Plan - 05/14/20 1013    Clinical Impression Statement Patient arrived to session with questions on HEP and inquiring on if he can return to [employer]. Notes that he is still sleeping in recliner rather than bed d/t pain. Advised patient to return to gym workout to tolerance, avoiding activities that cause him pain.  Answered patient's questions while reviewing HEP, with patient reporting good understanding. Patient still demonstrating limited thoracolumbar rotation mobility and limited by L shoulder pain on the L, thus encouraged patient to maintain hand behind the head position for improved tolerance. Patient required explanation and correction of alignment with hip flexor stretch as well as cueing to avoid pushing into pain. Initiated bridges with patient demonstrating decent ROM but with L rather than R hip instability evident. Updated this exercise into HEP- patient reported understanding. Patient declined modalities at end of session despite c/o L hip discomfort.    Comorbidities DMII, hearing loss, obesity, HTN, HLD, gout, prostate CA 2011, CAD, CABG 2005, R hip surgery 2006    PT Treatment/Interventions ADLs/Self Care Home Management;Cryotherapy;Electrical Stimulation;Iontophoresis 4mg /ml Dexamethasone;Moist Heat;Traction;Balance training;Therapeutic exercise;Therapeutic activities;Functional mobility training;Stair training;Gait  training;Ultrasound;Neuromuscular re-education;Patient/family education;Manual techniques;Vasopneumatic Device;Taping;Energy conservation;Dry needling;Passive range of motion    PT Next Visit Plan progress hip flexor and quad strength; hip mobility    Consulted and  Agree with Plan of Care Patient           Patient will benefit from skilled therapeutic intervention in order to improve the following deficits and impairments:  Hypomobility,Pain,Increased fascial restricitons,Decreased strength,Decreased activity tolerance,Difficulty walking,Increased muscle spasms,Improper body mechanics,Decreased range of motion,Impaired flexibility,Postural dysfunction  Visit Diagnosis: Radiculopathy, lumbar region  Pain in left hip  Other symptoms and signs involving the musculoskeletal system     Problem List Patient Active Problem List   Diagnosis Date Noted  . Type 2 diabetes mellitus with hyperglycemia, with long-term current use of insulin ([place]) 11/11/2018  . Type 2 diabetes mellitus with diabetic polyneuropathy, with long-term current use of insulin ([place]) 11/11/2018  . Type 2 diabetes mellitus with stage 3 chronic kidney disease, with long-term current use of insulin ([place]) 11/11/2018  . Diabetes mellitus ([place]) 11/11/2018  . Hx of CABG 11/05/2018  . Dyslipidemia 11/05/2018  . Primary osteoarthritis of left shoulder 10/31/2018  . Long term (current) use of anticoagulants 05/22/2018  . Hereditary hemochromatosis ([place]) 03/30/2016  . Gout 04/09/2015  . Ventral hernia 03/03/2014  . History of prostate cancer 11/28/2013  . OSA (obstructive sleep apnea) 07/12/2012  . DM (diabetes mellitus), type 2, uncontrolled with complications ([place]) 52/84/1324  . Hemochromatosis 02/14/2011  . Actinic keratosis 02/14/2011  . Hypertriglyceridemia 06/13/2010  . HEMORRHOIDS, EXTERNAL 03/21/2010  . History of aortic valve replacement 10/13/2009  . ADENOCARCINOMA, PROSTATE 06/17/2009  . TOBACCO USER 06/16/2009   . HOH (hard of hearing) 06/16/2009  . Essential hypertension 06/16/2009  . CORONARY ATHEROSCLEROSIS NATIVE CORONARY ARTERY 06/15/2009  . ERECTILE DYSFUNCTION, ORGANIC 05/18/2009  . Hypertrophic obstructive cardiomyopathy ([place]) 05/06/2008  . OBESITY, MODERATE 04/29/2008     Janene Harvey, PT, DPT 05/14/20 10:17 AM   [facility] [address]  [place], [place], [address] Phone: [phone]   Fax:  [phone]  Name: [name] MRN: [id number] Date of Birth: 30-Oct-1945

## 2020-05-17 ENCOUNTER — Other Ambulatory Visit: Payer: Self-pay

## 2020-05-17 ENCOUNTER — Ambulatory Visit: Payer: Medicare HMO

## 2020-05-17 DIAGNOSIS — R29898 Other symptoms and signs involving the musculoskeletal system: Secondary | ICD-10-CM

## 2020-05-17 DIAGNOSIS — M25552 Pain in left hip: Secondary | ICD-10-CM

## 2020-05-17 DIAGNOSIS — M5416 Radiculopathy, lumbar region: Secondary | ICD-10-CM

## 2020-05-17 NOTE — Progress Notes (Signed)
Name: PANAYIOTIS RAINVILLE  Age/ Sex: 75 y.o., male   MRN/ DOB: [id number], 1945-03-19     PCP: [name], NP   Reason for Endocrinology Evaluation: Type 2 Diabetes Mellitus  Initial Endocrine Consultative Visit: 11/11/2018    PATIENT IDENTIFIER: [name] is a 75 y.o. male with a past medical history of T2DM, OSA, HTN, CAD ( S/P CABG) . The patient has followed with Endocrinology clinic since 11/11/2018 for consultative assistance with management of his diabetes.  DIABETIC HISTORY:  [name] was diagnosed with T2DM in 2005. He was on metformin which was stopped due to renal dysfunction . Has been on insulin since 2015. His hemoglobin A1c has ranged from 6.3% in 2015, peaking at 8.3% in 2020.  On his initial visit to our clinic he was on basal insulin only. With an A1c of 8.3. Trulicity was added.   SUBJECTIVE:   During the last visit (02/03/2020): A1c of 8.8 %.  We increased  Trulicity and basal insulin   Today (05/18/2020): [name] is here for a  follow up on diabetes management.  He checks his blood sugars 2 times daily, preprandial to breakfast and bedtime. The patient has not had hypoglycemic episodes since the last clinic visit.  Denies nausea, vomiting nor diarrhea  Undergoing therapy for leg pains    HOME DIABETES REGIMEN:  Basaglar 38 units daily  Trulicity 3 mg once weekly    METER DOWNLOAD SUMMARY: 4/5-4/19/2022  Average Number Tests/Day = 1.7 Overall Mean FS Glucose = 226 Standard Deviation = 89  BG Ranges: Low = 118 High = 406   Hypoglycemic Events/30 Days: BG < 50 = 0 Episodes of symptomatic severe hypoglycemia = 0       DIABETIC COMPLICATIONS: Microvascular complications:   CKDIII- Dr. Hollie Salk   Denies: neuropathy, retinopathy   Last eye exam: Completed 08/2019  Macrovascular complications:   CAD (S/P CABG )   Denies: PVD, CVA   HISTORY:  Past Medical History:  Past Medical History:  Diagnosis Date  .  Allergy   . Aortic stenosis   . CAD (coronary artery disease)    cabg  . Cancer [facility]) 2011   prostate  . Erectile dysfunction   . Heart murmur   . Hemochromatosis 02/14/2011  . Hemorrhoids   . History of gout   . History of hepatitis 1974  . Hyperlipidemia   . Hypertension   . Hypertr obst cardiomyop   . Obesity    moderate  . Sensorineural hearing loss   . Type II or unspecified type diabetes mellitus without mention of complication, not stated as uncontrolled    Past Surgical History:  Past Surgical History:  Procedure Laterality Date  . AORTIC VALVE REPLACEMENT  11/14/2003   [facility]  . APPENDECTOMY  1990  . CHOLECYSTECTOMY  1990  . CORONARY ARTERY BYPASS GRAFT  10/2003  . HERNIA REPAIR  1999   right, inguinal  . HERNIA REPAIR  2002   left, inguinal  . HIP SURGERY  2006   right hip  . PILONIDAL CYST EXCISION  1964  . prostate seed implant  3/12  . TONSILLECTOMY  childhood    Social History:  reports that he has been smoking cigars. He has never used smokeless tobacco. He reports current alcohol use. He reports that he does not use drugs. Family History:  Family History  Problem Relation Age of Onset  . Heart disease Mother   . Stroke Mother   .  Heart disease Father   . Asperger's syndrome Son   . Hyperlipidemia Son   . Coronary artery disease Brother   . Cancer Neg Hx        negative for colon cancer     HOME MEDICATIONS: Allergies as of 05/18/2020   No Known Allergies     Medication List       Accurate as of May 18, 2020 10:11 AM. If you have any questions, ask your nurse or doctor.        AMBULATORY NON FORMULARY MEDICATION cpap cushions            AirFit F20 (Size: Large)           AirFit F30 (Size: Med)  Please FAX the prescription to:  [phone]   aspirin EC 81 MG tablet Take 1 tablet (81 mg total) by mouth daily.   atorvastatin 40 MG tablet Commonly known as: LIPITOR Take 1 tablet (40 mg total) by mouth daily.    Basaglar KwikPen 100 UNIT/ML Inject 38 Units into the skin daily.   betamethasone dipropionate 0.05 % cream Apply topically 2 (two) times daily as needed. To eczema rash   Blood Glucose Monitoring Suppl Supplies Misc Use for monitoring glucose level   colchicine 0.6 MG tablet TAKE 2 TABS BY MOUTH NOW AND THEN 1 TAB IN 1 HOUR FOR GOUT. (MAX 3 TABS/24 HRS)   fenofibrate 145 MG tablet Commonly known as: TRICOR Take 1 tablet (145 mg total) by mouth daily.   furosemide 20 MG tablet Commonly known as: LASIX Take 1 tablet (20 mg total) by mouth daily as needed. For swelling   gabapentin 100 MG capsule Commonly known as: NEURONTIN Take 1 capsule (100 mg total) by mouth 3 (three) times daily as needed.   imiquimod 5 % cream Commonly known as: ALDARA Apply topically 3 (three) times a week.   metoprolol succinate 25 MG 24 hr tablet Commonly known as: TOPROL-XL TAKE 1 TABLET BY MOUTH EVERY DAY   NovoFine Plus Pen Needle 32G X 4 MM Misc Generic drug: Insulin Pen Needle USE FOR INJECTIONS INTO THE SKIN EVERY EVENING   OneTouch Delica Lancets 01I Misc USE AS DIRECTED   OneTouch Verio test strip Generic drug: glucose blood USE AS INSTRUCTED TO CHECK BLOOD SUGAR TWICE A DAY. DX E11.8   Suprep Bowel Prep Kit 17.5-3.13-1.6 GM/177ML Soln Generic drug: Na Sulfate-K Sulfate-Mg Sulf Take 1 kit by mouth as directed.   Trulicity 3 DC/3.0DT Sopn Generic drug: Dulaglutide Inject 3 mg as directed once a week.   warfarin 7.5 MG tablet Commonly known as: COUMADIN Take as directed by the anticoagulation clinic. If you are unsure how to take this medication, talk to your nurse or doctor. Original instructions: Take 1-2 tablets Daily or as prescribed by Coumadin Clinic        OBJECTIVE:   Vital Signs: BP 134/70   Pulse 71   Ht 5' 9.5" (1.765 m)   Wt 211 lb 4 oz (95.8 kg)   SpO2 98%   BMI 30.75 kg/m   Wt Readings from Last 3 Encounters:  05/18/20 211 lb 4 oz (95.8 kg)  04/20/20  203 lb (92.1 kg)  04/05/20 214 lb (97.1 kg)     Exam: General: Pt appears well and is in NAD  Lungs: Clear with good BS bilat with no rales, rhonchi, or wheezes  Heart: RRR with normal S1 and S2 and no gallops; no murmurs; no rub  Extremities: No pretibial edema.  Neuro: MS is good with appropriate affect, pt is alert and Ox3       DM foot exam: Pt states he had a food exam through podiatry 09/23/2019     DATA REVIEWED:  Lab Results  Component Value Date   HGBA1C 9.6 (A) 05/18/2020   HGBA1C 8.8 (A) 02/03/2020   HGBA1C 7.9 (A) 09/24/2019   Lab Results  Component Value Date   MICROALBUR 8.7 (H) 09/09/2014   LDLCALC 99 09/22/2019   CREATININE 1.63 (H) 09/22/2019   Lab Results  Component Value Date   MICRALBCREAT 5.0 09/09/2014     Lab Results  Component Value Date   CHOL 172 09/22/2019   HDL 26 (L) 09/22/2019   LDLCALC 99 09/22/2019   LDLDIRECT 83.0 09/06/2018   TRIG 355 (H) 09/22/2019   CHOLHDL 6.6 (H) 09/22/2019         ASSESSMENT / PLAN / RECOMMENDATIONS:   1) Type 2 Diabetes Mellitus, Poorly controlled, With CKD III , Neuropathic and Macrovascular complications - Most recent A1c of 9.6 %. Goal A1c < 7.5%.   - Worsening glycemic control despite increasing insulin and doubling up on Trulicity  - He  understands that we are limited in oral glycemic agents due to CKD III and I have recommended starting a prandial insulin given his hyperglycemia is postprandial with BG's > 250 mg/dL at bedtime  - Will continue Trulicity due to cardiovascular  benefits at this time  - Dexcom prescription sent   MEDICATIONS:  Continue  Basaglar 38 units  Continue  Trulicity 3 mg weekly   Start Novolog 10 units [facility]   EDUCATION / INSTRUCTIONS:  BG monitoring instructions: Patient is instructed to check his blood sugars 2 times a day, fasting and bedtime .  Call [place] [facility] if: BG persistently < 70 . I reviewed the Rule of 15 for the treatment of  hypoglycemia in detail with the patient. Literature supplied.     F/U in 3 months    Signed electronically by: Mack Guise, MD  [facility]  [address] [place], [place] [address] Phone: [phone] FAX: [phone]   CC: [name], [place] [address] [place] [address] Phone: [phone]  Fax: [phone]  Return to Endocrinology clinic as below: Future Appointments  Date Time Provider [place]  05/21/2020  9:30 AM June Leap, PT [facility]  05/25/2020  9:30 AM Artist Pais, PTA [facility]  05/26/2020 11:00 AM Donalee Citrin, AUD [facility] None  05/27/2020  8:45 AM Artist Pais, PTA [facility]  05/31/2020  9:30 AM Janene Harvey D, PT [facility]  06/02/2020 10:40 AM [name], NP [facility]  06/03/2020  9:30 AM June Leap, PT [facility]  06/07/2020 10:15 AM Artist Pais, PTA [facility]  06/09/2020  1:30 PM [facility]  06/10/2020  9:30 AM June Leap, PT [facility]

## 2020-05-17 NOTE — Therapy (Signed)
[facility] [address]  [place], [place], [address] Phone: [phone]   Fax:  [phone]  Physical Therapy Treatment  Patient Details  Name: [name] MRN: [id number] Date of Birth: 19-Jul-1945 Referring Provider (PT): [name], NP   Encounter Date: 05/17/2020   PT End of Session - 05/17/20 1532    Visit Number 3    Number of Visits 13    Date for PT Re-Evaluation 06/23/20    Authorization Type [employer] Medicare    PT Start Time 1448    PT Stop Time 1529    PT Time Calculation (min) 41 min    Activity Tolerance Patient tolerated treatment well    Behavior During Therapy [facility] for tasks assessed/performed           Past Medical History:  Diagnosis Date  . Allergy   . Aortic stenosis   . CAD (coronary artery disease)    cabg  . Cancer [facility]) 2011   prostate  . Erectile dysfunction   . Heart murmur   . Hemochromatosis 02/14/2011  . Hemorrhoids   . History of gout   . History of hepatitis 1974  . Hyperlipidemia   . Hypertension   . Hypertr obst cardiomyop   . Obesity    moderate  . Sensorineural hearing loss   . Type II or unspecified type diabetes mellitus without mention of complication, not stated as uncontrolled     Past Surgical History:  Procedure Laterality Date  . AORTIC VALVE REPLACEMENT  11/14/2003   [facility]  . APPENDECTOMY  1990  . CHOLECYSTECTOMY  1990  . CORONARY ARTERY BYPASS GRAFT  10/2003  . HERNIA REPAIR  1999   right, inguinal  . HERNIA REPAIR  2002   left, inguinal  . HIP SURGERY  2006   right hip  . PILONIDAL CYST EXCISION  1964  . prostate seed implant  3/12  . TONSILLECTOMY  childhood    There were no vitals filed for this visit.   Subjective Assessment - 05/17/20 1453    Subjective Pt reports that last night he was able to sleep all night which he hasn't been able to do in a long time. Notes N/T in L LE.    Pertinent History DMII, hearing  loss, obesity, HTN, HLD, gout, prostate CA 2011, CAD, CABG 2005, R hip surgery 2006    Diagnostic tests none recent- waiting for MRI authorization    Patient Stated Goals decrease pain    Currently in Pain? No/denies                             [facility] Adult PT Treatment/Exercise - 05/17/20 0001      Exercises   Exercises Knee/Hip;Lumbar      Lumbar Exercises: Aerobic   Nustep L3 x 6 min (UEs/LEs)      Lumbar Exercises: Supine   Clam 10 reps;2 seconds   R Tband   Clam Limitations R Tband    Straight Leg Raise 10 reps    Straight Leg Raises Limitations both sides    Other Supine Lumbar Exercises ball squeeze 10 reps      Lumbar Exercises: Sidelying   Other Sidelying Lumbar Exercises R/L open book stretch 10x each      Knee/Hip Exercises: Stretches   Active Hamstring Stretch Left;2 reps;30 seconds    Active Hamstring Stretch Limitations seated with strap DF  Hip Flexor Stretch Left;2 reps;30 seconds    Hip Flexor Stretch Limitations mod thomas with strap    Piriformis Stretch Right;Left;2 reps;30 seconds    Piriformis Stretch Limitations supine KTOS and figure 4      Knee/Hip Exercises: Supine   Bridges Strengthening;Both;1 set;10 reps                  PT Education - 05/17/20 1543    Education Details HEP update: Access Code: [email or web address]    Person(s) Educated Patient    Methods Explanation;Demonstration;Verbal cues;Tactile cues;Handout    Comprehension Verbalized understanding;Returned demonstration;Verbal cues required;Tactile cues required;Need further instruction            PT Short Term Goals - 05/14/20 1016      PT SHORT TERM GOAL #1   Title Patient to be independent with initial HEP.    Time 3    Period Weeks    Status On-going    Target Date 06/02/20             PT Long Term Goals - 05/14/20 1016      PT LONG TERM GOAL #1   Title Patient to be independent with advanced HEP.    Time 6    Period Weeks    Status On-going       PT LONG TERM GOAL #2   Title Patient to demonstrate B LE strength >/=4+/5.    Time 6    Period Weeks    Status On-going      PT LONG TERM GOAL #3   Title Patient to demonstrate lumbar AROM WFL and without pain limiting.    Time 6    Period Weeks    Status On-going      PT LONG TERM GOAL #4   Title Patient to ascend/descend 13 steps with 1 handrail as needed with reciprocal pattern and good stability.    Time 6    Period Weeks    Status On-going      PT LONG TERM GOAL #5   Title Patient to report tolerance for supine positioning without pain limiting.    Time 6    Period Weeks    Status On-going                 Plan - 05/17/20 1544    Clinical Impression Statement Pt reported to session doing well. Pt had questions about HEP exercises, reviewed these exercises to ensure understanding, he verbalized understanding. Pt showed continued tightness in B piriformis, L hip flexor and hamstring. He reported the hamstring stretch, alleviated some N/T into his L leg. Pt demonstrated better stability today with bridges and good ROM. Still inquired on returning to gym, advised him to return but to monitor symptoms and avoid lifting heavy weight to reduce stress on L leg and back.    Personal Factors and Comorbidities Age;Comorbidity 3+;Fitness;Past/Current Experience;Time since onset of injury/illness/exacerbation    Comorbidities DMII, hearing loss, obesity, HTN, HLD, gout, prostate CA 2011, CAD, CABG 2005, R hip surgery 2006    PT Frequency 2x / week    PT Duration 6 weeks    PT Treatment/Interventions ADLs/Self Care Home Management;Cryotherapy;Electrical Stimulation;Iontophoresis 4mg /ml Dexamethasone;Moist Heat;Traction;Balance training;Therapeutic exercise;Therapeutic activities;Functional mobility training;Stair training;Gait training;Ultrasound;Neuromuscular re-education;Patient/family education;Manual techniques;Vasopneumatic Device;Taping;Energy conservation;Dry needling;Passive  range of motion    PT Next Visit Plan progress hip flexor and quad strength; hip mobility    Consulted and Agree with Plan of Care Patient  Patient will benefit from skilled therapeutic intervention in order to improve the following deficits and impairments:  Hypomobility,Pain,Increased fascial restricitons,Decreased strength,Decreased activity tolerance,Difficulty walking,Increased muscle spasms,Improper body mechanics,Decreased range of motion,Impaired flexibility,Postural dysfunction  Visit Diagnosis: Radiculopathy, lumbar region  Pain in left hip  Other symptoms and signs involving the musculoskeletal system     Problem List Patient Active Problem List   Diagnosis Date Noted  . Type 2 diabetes mellitus with hyperglycemia, with long-term current use of insulin ([place]) 11/11/2018  . Type 2 diabetes mellitus with diabetic polyneuropathy, with long-term current use of insulin ([place]) 11/11/2018  . Type 2 diabetes mellitus with stage 3 chronic kidney disease, with long-term current use of insulin ([place]) 11/11/2018  . Diabetes mellitus ([place]) 11/11/2018  . Hx of CABG 11/05/2018  . Dyslipidemia 11/05/2018  . Primary osteoarthritis of left shoulder 10/31/2018  . Long term (current) use of anticoagulants 05/22/2018  . Hereditary hemochromatosis ([place]) 03/30/2016  . Gout 04/09/2015  . Ventral hernia 03/03/2014  . History of prostate cancer 11/28/2013  . OSA (obstructive sleep apnea) 07/12/2012  . DM (diabetes mellitus), type 2, uncontrolled with complications ([place]) 62/22/9798  . Hemochromatosis 02/14/2011  . Actinic keratosis 02/14/2011  . Hypertriglyceridemia 06/13/2010  . HEMORRHOIDS, EXTERNAL 03/21/2010  . History of aortic valve replacement 10/13/2009  . ADENOCARCINOMA, PROSTATE 06/17/2009  . TOBACCO USER 06/16/2009  . HOH (hard of hearing) 06/16/2009  . Essential hypertension 06/16/2009  . CORONARY ATHEROSCLEROSIS NATIVE CORONARY ARTERY 06/15/2009  . ERECTILE  DYSFUNCTION, ORGANIC 05/18/2009  . Hypertrophic obstructive cardiomyopathy ([place]) 05/06/2008  . OBESITY, MODERATE 04/29/2008    Artist Pais, PTA 05/17/2020, 3:52 PM  [facility] [address]  [place], [place], [address] Phone: [phone]   Fax:  [phone]  Name: [name] MRN: [id number] Date of Birth: 12-16-1945

## 2020-05-18 ENCOUNTER — Ambulatory Visit (INDEPENDENT_AMBULATORY_CARE_PROVIDER_SITE_OTHER): Payer: Medicare HMO | Admitting: Internal Medicine

## 2020-05-18 ENCOUNTER — Encounter: Payer: Self-pay | Admitting: Internal Medicine

## 2020-05-18 ENCOUNTER — Other Ambulatory Visit: Payer: Self-pay | Admitting: Internal Medicine

## 2020-05-18 VITALS — BP 134/70 | HR 71 | Ht 69.5 in | Wt 211.2 lb

## 2020-05-18 DIAGNOSIS — E1122 Type 2 diabetes mellitus with diabetic chronic kidney disease: Secondary | ICD-10-CM | POA: Diagnosis not present

## 2020-05-18 DIAGNOSIS — E1159 Type 2 diabetes mellitus with other circulatory complications: Secondary | ICD-10-CM | POA: Diagnosis not present

## 2020-05-18 DIAGNOSIS — E1142 Type 2 diabetes mellitus with diabetic polyneuropathy: Secondary | ICD-10-CM | POA: Diagnosis not present

## 2020-05-18 DIAGNOSIS — N183 Chronic kidney disease, stage 3 unspecified: Secondary | ICD-10-CM

## 2020-05-18 DIAGNOSIS — Z794 Long term (current) use of insulin: Secondary | ICD-10-CM

## 2020-05-18 LAB — POCT GLYCOSYLATED HEMOGLOBIN (HGB A1C): Hemoglobin A1C: 9.6 % — AB (ref 4.0–5.6)

## 2020-05-18 MED ORDER — INSULIN PEN NEEDLE 32G X 4 MM MISC: 1.0000 | Freq: Four times a day (QID) | 3 refills | Status: DC

## 2020-05-18 MED ORDER — INSULIN LISPRO (1 UNIT DIAL) 100 UNIT/ML (KWIKPEN): 10.0000 [IU] | PEN_INJECTOR | Freq: Three times a day (TID) | SUBCUTANEOUS | 3 refills | Status: DC

## 2020-05-18 MED ORDER — DEXCOM G6 TRANSMITTER MISC: 1.0000 | 3 refills | Status: DC

## 2020-05-18 MED ORDER — DEXCOM G6 SENSOR MISC: 1.0000 | 3 refills | Status: DC

## 2020-05-18 NOTE — Patient Instructions (Signed)
-   Continue Basaglar 38 units daily  - Continue Trulicity 3 mg weekly  - Start Humalog 10 units with each meal      HOW TO TREAT LOW BLOOD SUGARS (Blood sugar LESS THAN 70 MG/DL)  Please follow the RULE OF 15 for the treatment of hypoglycemia treatment (when your (blood sugars are less than 70 mg/dL)    STEP 1: Take 15 grams of carbohydrates when your blood sugar is low, which includes:   3-4 GLUCOSE TABS  OR  3-4 OZ OF JUICE OR REGULAR SODA OR  ONE TUBE OF GLUCOSE GEL     STEP 2: RECHECK blood sugar in 15 MINUTES STEP 3: If your blood sugar is still low at the 15 minute recheck --> then, go back to STEP 1 and treat AGAIN with another 15 grams of carbohydrates.

## 2020-05-21 ENCOUNTER — Encounter: Payer: Self-pay | Admitting: Internal Medicine

## 2020-05-21 ENCOUNTER — Encounter: Payer: Self-pay | Admitting: Physical Therapy

## 2020-05-21 ENCOUNTER — Ambulatory Visit: Payer: Medicare HMO | Admitting: Physical Therapy

## 2020-05-21 ENCOUNTER — Other Ambulatory Visit: Payer: Self-pay

## 2020-05-21 DIAGNOSIS — M5416 Radiculopathy, lumbar region: Secondary | ICD-10-CM

## 2020-05-21 DIAGNOSIS — M25552 Pain in left hip: Secondary | ICD-10-CM | POA: Diagnosis not present

## 2020-05-21 DIAGNOSIS — R29898 Other symptoms and signs involving the musculoskeletal system: Secondary | ICD-10-CM

## 2020-05-21 NOTE — Therapy (Signed)
[facility] [address]  [place], [place], [address] Phone: [phone]   Fax:  [phone]  Physical Therapy Treatment  Patient Details  Name: [name] MRN: [id number] Date of Birth: 08-Jul-1945 Referring Provider (PT): [name], NP   Encounter Date: 05/21/2020   PT End of Session - 05/21/20 1019    Visit Number 4    Number of Visits 13    Date for PT Re-Evaluation 06/23/20    Authorization Type Aetna Medicare    PT Start Time [phone]    PT Stop Time 1013    PT Time Calculation (min) 39 min    Activity Tolerance Patient tolerated treatment well    Behavior During Therapy [facility] for tasks assessed/performed           Past Medical History:  Diagnosis Date  . Allergy   . Aortic stenosis   . CAD (coronary artery disease)    cabg  . Cancer [facility]) 2011   prostate  . Erectile dysfunction   . Heart murmur   . Hemochromatosis 02/14/2011  . Hemorrhoids   . History of gout   . History of hepatitis 1974  . Hyperlipidemia   . Hypertension   . Hypertr obst cardiomyop   . Obesity    moderate  . Sensorineural hearing loss   . Type II or unspecified type diabetes mellitus without mention of complication, not stated as uncontrolled     Past Surgical History:  Procedure Laterality Date  . AORTIC VALVE REPLACEMENT  11/14/2003   [facility]  . APPENDECTOMY  1990  . CHOLECYSTECTOMY  1990  . CORONARY ARTERY BYPASS GRAFT  10/2003  . HERNIA REPAIR  1999   right, inguinal  . HERNIA REPAIR  2002   left, inguinal  . HIP SURGERY  2006   right hip  . PILONIDAL CYST EXCISION  1964  . prostate seed implant  3/12  . TONSILLECTOMY  childhood    There were no vitals filed for this visit.   Subjective Assessment - 05/21/20 0935    Subjective Feeling better from 2 weeks ago- still taking pain relievers given by PCP. Was able to sleep in his bed all night this week which he has not been able to do since  february.    Pertinent History DMII, hearing loss, obesity, HTN, HLD, gout, prostate CA 2011, CAD, CABG 2005, R hip surgery 2006    Diagnostic tests none recent- waiting for MRI authorization    Patient Stated Goals decrease pain    Currently in Pain? No/denies                             [facility] Adult PT Treatment/Exercise - 05/21/20 0001      Lumbar Exercises: Stretches   Lower Trunk Rotation Limitations x10 with LEs on orange pball   cues to contract core     Lumbar Exercises: Aerobic   Nustep L4 x 6 min (UEs/LEs)      Lumbar Exercises: Supine   Other Supine Lumbar Exercises B isometric hip flexion with orange pball 10x5"   cues to avoid valsalva     Knee/Hip Exercises: Stretches   Hip Flexor Stretch Left;2 reps;30 seconds    Hip Flexor Stretch Limitations mod thomas with strap   cues to maintain foot on floor     Knee/Hip Exercises: Standing   Hip Flexion Stengthening;Both;2 sets;20 reps;Knee bent  Hip Flexion Limitations resisted marching with yellow TB around toes   no pain; c/o L LE weakness   Forward Step Up Left;1 set;10 reps;Hand Hold: 1;Step Height: 6"    Forward Step Up Limitations L step up/back slow    Step Down Left;1 set;10 reps;Step Height: 4";Hand Hold: 1    Step Down Limitations L step up/over                  PT Education - 05/21/20 1019    Education Details update to HEP    Person(s) Educated Patient    Methods Explanation;Demonstration;Tactile cues;Verbal cues;Handout    Comprehension Verbalized understanding;Returned demonstration            PT Short Term Goals - 05/21/20 1021      PT SHORT TERM GOAL #1   Title Patient to be independent with initial HEP.    Time 3    Period Weeks    Status Achieved    Target Date 06/02/20             PT Long Term Goals - 05/14/20 1016      PT LONG TERM GOAL #1   Title Patient to be independent with advanced HEP.    Time 6    Period Weeks    Status On-going      PT LONG  TERM GOAL #2   Title Patient to demonstrate B LE strength >/=4+/5.    Time 6    Period Weeks    Status On-going      PT LONG TERM GOAL #3   Title Patient to demonstrate lumbar AROM WFL and without pain limiting.    Time 6    Period Weeks    Status On-going      PT LONG TERM GOAL #4   Title Patient to ascend/descend 13 steps with 1 handrail as needed with reciprocal pattern and good stability.    Time 6    Period Weeks    Status On-going      PT LONG TERM GOAL #5   Title Patient to report tolerance for supine positioning without pain limiting.    Time 6    Period Weeks    Status On-going                 Plan - 05/21/20 1020    Clinical Impression Statement Patient arrived to session with report of being able to sleep in his bed all night this week which he had not been able to do since February. Worked on standing LE strengthening ther-ex with hand support. Patient reported weakness with resisted hip strengthening but able to perform pain-free and with good amplitude of movement. Demonstrated slight L knee instability with step ups and limited eccentric control with step downs, however able to perform safely. Updated HEP with exercises that were well-tolerated today. Patient reported understanding. Patient continues to demonstrate improvement in hip flexor stretching tolerance. Ended session without complaints at end of session. Patient is progressing well towards goals.    Personal Factors and Comorbidities Age;Comorbidity 3+;Fitness;Past/Current Experience;Time since onset of injury/illness/exacerbation    Comorbidities DMII, hearing loss, obesity, HTN, HLD, gout, prostate CA 2011, CAD, CABG 2005, R hip surgery 2006    PT Frequency 2x / week    PT Duration 6 weeks    PT Treatment/Interventions ADLs/Self Care Home Management;Cryotherapy;Electrical Stimulation;Iontophoresis 4mg /ml Dexamethasone;Moist Heat;Traction;Balance training;Therapeutic exercise;Therapeutic  activities;Functional mobility training;Stair training;Gait training;Ultrasound;Neuromuscular re-education;Patient/family education;Manual techniques;Vasopneumatic Device;Taping;Energy conservation;Dry needling;Passive range of motion  PT Next Visit Plan progress hip flexor and quad strength; hip mobility    Consulted and Agree with Plan of Care Patient           Patient will benefit from skilled therapeutic intervention in order to improve the following deficits and impairments:  Hypomobility,Pain,Increased fascial restricitons,Decreased strength,Decreased activity tolerance,Difficulty walking,Increased muscle spasms,Improper body mechanics,Decreased range of motion,Impaired flexibility,Postural dysfunction  Visit Diagnosis: Radiculopathy, lumbar region  Pain in left hip  Other symptoms and signs involving the musculoskeletal system     Problem List Patient Active Problem List   Diagnosis Date Noted  . Type 2 diabetes mellitus with hyperglycemia, with long-term current use of insulin ([place]) 11/11/2018  . Type 2 diabetes mellitus with diabetic polyneuropathy, with long-term current use of insulin ([place]) 11/11/2018  . Type 2 diabetes mellitus with stage 3 chronic kidney disease, with long-term current use of insulin ([place]) 11/11/2018  . Diabetes mellitus ([place]) 11/11/2018  . Hx of CABG 11/05/2018  . Dyslipidemia 11/05/2018  . Primary osteoarthritis of left shoulder 10/31/2018  . Long term (current) use of anticoagulants 05/22/2018  . Hereditary hemochromatosis ([place]) 03/30/2016  . Gout 04/09/2015  . Ventral hernia 03/03/2014  . History of prostate cancer 11/28/2013  . OSA (obstructive sleep apnea) 07/12/2012  . DM (diabetes mellitus), type 2, uncontrolled with complications ([place]) 89/16/9450  . Hemochromatosis 02/14/2011  . Actinic keratosis 02/14/2011  . Hypertriglyceridemia 06/13/2010  . HEMORRHOIDS, EXTERNAL 03/21/2010  . History of aortic valve replacement 10/13/2009  .  ADENOCARCINOMA, PROSTATE 06/17/2009  . TOBACCO USER 06/16/2009  . HOH (hard of hearing) 06/16/2009  . Essential hypertension 06/16/2009  . CORONARY ATHEROSCLEROSIS NATIVE CORONARY ARTERY 06/15/2009  . ERECTILE DYSFUNCTION, ORGANIC 05/18/2009  . Hypertrophic obstructive cardiomyopathy ([place]) 05/06/2008  . OBESITY, MODERATE 04/29/2008     Janene Harvey, PT, DPT 05/21/20 10:22 AM   [facility] [address]  [place], [place], [address] Phone: [phone]   Fax:  [phone]  Name: [name] MRN: [id number] Date of Birth: [birth year]

## 2020-05-25 ENCOUNTER — Other Ambulatory Visit: Payer: Self-pay

## 2020-05-25 ENCOUNTER — Ambulatory Visit: Payer: Medicare HMO

## 2020-05-25 DIAGNOSIS — M5416 Radiculopathy, lumbar region: Secondary | ICD-10-CM

## 2020-05-25 DIAGNOSIS — R29898 Other symptoms and signs involving the musculoskeletal system: Secondary | ICD-10-CM | POA: Diagnosis not present

## 2020-05-25 DIAGNOSIS — M25552 Pain in left hip: Secondary | ICD-10-CM | POA: Diagnosis not present

## 2020-05-25 NOTE — Therapy (Signed)
[facility] [address]  [place], [place], [address] Phone: [phone]   Fax:  [phone]  Physical Therapy Treatment  Patient Details  Name: [name] MRN: [id number] Date of Birth: October 15, 1945 Referring Provider (PT): [name], NP   Encounter Date: 05/25/2020   PT End of Session - 05/25/20 1017    Visit Number 5    Number of Visits 13    Date for PT Re-Evaluation 06/23/20    Authorization Type Aetna Medicare    PT Start Time [phone]   pt late   PT Stop Time 1019    PT Time Calculation (min) 38 min    Activity Tolerance Patient tolerated treatment well    Behavior During Therapy [facility] for tasks assessed/performed           Past Medical History:  Diagnosis Date  . Allergy   . Aortic stenosis   . CAD (coronary artery disease)    cabg  . Cancer [facility]) 2011   prostate  . Erectile dysfunction   . Heart murmur   . Hemochromatosis 02/14/2011  . Hemorrhoids   . History of gout   . History of hepatitis 1974  . Hyperlipidemia   . Hypertension   . Hypertr obst cardiomyop   . Obesity    moderate  . Sensorineural hearing loss   . Type II or unspecified type diabetes mellitus without mention of complication, not stated as uncontrolled     Past Surgical History:  Procedure Laterality Date  . AORTIC VALVE REPLACEMENT  11/14/2003   [facility]  . APPENDECTOMY  1990  . CHOLECYSTECTOMY  1990  . CORONARY ARTERY BYPASS GRAFT  10/2003  . HERNIA REPAIR  1999   right, inguinal  . HERNIA REPAIR  2002   left, inguinal  . HIP SURGERY  2006   right hip  . PILONIDAL CYST EXCISION  1964  . prostate seed implant  3/12  . TONSILLECTOMY  childhood    There were no vitals filed for this visit.   Subjective Assessment - 05/25/20 0942    Subjective Knee has not been bothering him. L hip bothers him a little when he stands from sitting position    Pertinent History DMII, hearing loss, obesity, HTN,  HLD, gout, prostate CA 2011, CAD, CABG 2005, R hip surgery 2006    Diagnostic tests none recent- waiting for MRI authorization    Patient Stated Goals decrease pain    Currently in Pain? No/denies                             [facility] Adult PT Treatment/Exercise - 05/25/20 0001      Lumbar Exercises: Stretches   Active Hamstring Stretch Right;Left;30 seconds;2 reps    Active Hamstring Stretch Limitations seated hip hinge    Piriformis Stretch Right;Left;2 reps;30 seconds      Lumbar Exercises: Aerobic   Nustep L4 x 6 min (UEs/LEs)      Lumbar Exercises: Supine   Other Supine Lumbar Exercises trunk rotation with knees bent on orange pball 10 reps each side    Other Supine Lumbar Exercises B shoulder flexion with R medicine ball and knees bent on orange pball 10 reps      Lumbar Exercises: Sidelying   Other Sidelying Lumbar Exercises L open book 10x5"      Knee/Hip Exercises: Standing   Forward Step Up Hand Hold: 2;Step Height:  6";Both;1 set;10 reps    Forward Step Up Limitations step down with R for eccentric loading of L quad    Step Down Right;1 set;10 reps;Hand Hold: 1;Step Height: 4"                    PT Short Term Goals - 05/21/20 1021      PT SHORT TERM GOAL #1   Title Patient to be independent with initial HEP.    Time 3    Period Weeks    Status Achieved    Target Date 06/02/20             PT Long Term Goals - 05/14/20 1016      PT LONG TERM GOAL #1   Title Patient to be independent with advanced HEP.    Time 6    Period Weeks    Status On-going      PT LONG TERM GOAL #2   Title Patient to demonstrate B LE strength >/=4+/5.    Time 6    Period Weeks    Status On-going      PT LONG TERM GOAL #3   Title Patient to demonstrate lumbar AROM WFL and without pain limiting.    Time 6    Period Weeks    Status On-going      PT LONG TERM GOAL #4   Title Patient to ascend/descend 13 steps with 1 handrail as needed with reciprocal  pattern and good stability.    Time 6    Period Weeks    Status On-going      PT LONG TERM GOAL #5   Title Patient to report tolerance for supine positioning without pain limiting.    Time 6    Period Weeks    Status On-going                 Plan - 05/25/20 1024    Clinical Impression Statement Pt arrived late to session. Notes improvements in L knee strength and stability with the step up exercises. Also reports having no problems with his L knee since doing the exercises in PT. He demonstrated a good performance of the exercises and better breathing patterns to avoid valsalva. He does needs cues to keep his core engaged during exercises and to use his core to facilitate the movements. He reported that he walks around the house w/o his AD but reminded pt to keep using AD for safety purposes. Pt is noting good progress with therapy and remains w/o complaints durin sessions.    Personal Factors and Comorbidities Age;Comorbidity 3+;Fitness;Past/Current Experience;Time since onset of injury/illness/exacerbation    Comorbidities DMII, hearing loss, obesity, HTN, HLD, gout, prostate CA 2011, CAD, CABG 2005, R hip surgery 2006    PT Frequency 2x / week    PT Duration 6 weeks    PT Treatment/Interventions ADLs/Self Care Home Management;Cryotherapy;Electrical Stimulation;Iontophoresis 4mg /ml Dexamethasone;Moist Heat;Traction;Balance training;Therapeutic exercise;Therapeutic activities;Functional mobility training;Stair training;Gait training;Ultrasound;Neuromuscular re-education;Patient/family education;Manual techniques;Vasopneumatic Device;Taping;Energy conservation;Dry needling;Passive range of motion    PT Next Visit Plan progress hip flexor and quad strength; hip mobility    Consulted and Agree with Plan of Care Patient           Patient will benefit from skilled therapeutic intervention in order to improve the following deficits and impairments:  Hypomobility,Pain,Increased fascial  restricitons,Decreased strength,Decreased activity tolerance,Difficulty walking,Increased muscle spasms,Improper body mechanics,Decreased range of motion,Impaired flexibility,Postural dysfunction  Visit Diagnosis: Radiculopathy, lumbar region  Pain in left hip  Other symptoms  and signs involving the musculoskeletal system     Problem List Patient Active Problem List   Diagnosis Date Noted  . Type 2 diabetes mellitus with hyperglycemia, with long-term current use of insulin ([place]) 11/11/2018  . Type 2 diabetes mellitus with diabetic polyneuropathy, with long-term current use of insulin ([place]) 11/11/2018  . Type 2 diabetes mellitus with stage 3 chronic kidney disease, with long-term current use of insulin ([place]) 11/11/2018  . Diabetes mellitus ([place]) 11/11/2018  . Hx of CABG 11/05/2018  . Dyslipidemia 11/05/2018  . Primary osteoarthritis of left shoulder 10/31/2018  . Long term (current) use of anticoagulants 05/22/2018  . Hereditary hemochromatosis ([place]) 03/30/2016  . Gout 04/09/2015  . Ventral hernia 03/03/2014  . History of prostate cancer 11/28/2013  . OSA (obstructive sleep apnea) 07/12/2012  . DM (diabetes mellitus), type 2, uncontrolled with complications ([place]) 39/76/7341  . Hemochromatosis 02/14/2011  . Actinic keratosis 02/14/2011  . Hypertriglyceridemia 06/13/2010  . HEMORRHOIDS, EXTERNAL 03/21/2010  . History of aortic valve replacement 10/13/2009  . ADENOCARCINOMA, PROSTATE 06/17/2009  . TOBACCO USER 06/16/2009  . HOH (hard of hearing) 06/16/2009  . Essential hypertension 06/16/2009  . CORONARY ATHEROSCLEROSIS NATIVE CORONARY ARTERY 06/15/2009  . ERECTILE DYSFUNCTION, ORGANIC 05/18/2009  . Hypertrophic obstructive cardiomyopathy ([place]) 05/06/2008  . OBESITY, MODERATE 04/29/2008    Artist Pais, PTA 05/25/2020, 10:30 AM  [facility] [address]  [place], [place], [address] Phone: [phone]    Fax:  [phone]  Name: [name] MRN: [id number] Date of Birth: 1946-01-06

## 2020-05-26 ENCOUNTER — Ambulatory Visit: Payer: Medicare HMO | Attending: Family | Admitting: Audiologist

## 2020-05-26 DIAGNOSIS — H903 Sensorineural hearing loss, bilateral: Secondary | ICD-10-CM | POA: Insufficient documentation

## 2020-05-26 NOTE — Procedures (Signed)
Outpatient Audiology and [place], [place]  [address] [phone]  AUDIOLOGICAL  EVALUATION  NAME: David David     DOB:   02-11-1945      MRN: [id number]                                                                                     DATE: 05/26/2020     REFERENT: [name], NP STATUS: Outpatient DIAGNOSIS: Sensorineural Hearing Loss Bilateral Asymmetric     History: [name] was seen for an audiological evaluation. [name] was accompanied to the appointment by his wife.  [name] lost his hearing suddenly in the both ears around 2009. He was given a dose of prednisone by ENT Dr. Erik Obey. His hearing was partially restored in the right ear. He had a pair of Audibel hearing aids but they are no longer working. He had several hearing tests since then but has not gotten new hearing aids. Now he is struggling to communicate due to masks and is ready to pursue hearing aids again. [name] also mentioned he had started losing his hearing back when he was in the [name]. His wife says he has had hearing loss the entire time she has known him. [name] has history of diabetes type 2 which is a risk factor for hearing loss. No pain or pressure reported today. No perceived change in hearing since his last hearing test and evaluation by an ENT Physician.   Evaluation:   Otoscopy showed a clear view of the tympanic membranes, bilaterally  Tympanometry results were consistent with normal middle ear function, bilaterally    Audiometric testing was completed using conventional audiometry with insert transducer. Speech Recognition Thresholds were consistent with pure tone averages. Word Recognition was fair at an elevated level in the right ear and poor in the left ear. [name] could not tolerate above 80dB for word recognition in the right ear. No response to SRT or word testing in the left ear. Pure tone thresholds show moderate sloping to moderately  severe hearing loss in the right ear and severe to profound sensorineural hearing loss in the left ear. Test results are consistent with previous hearing test brought by the patient.   Results:  The test results were reviewed with [name] and his wife. [name] needs to start wearing hearing aids. He had good ability to understand speech in the right ear when it was loud enough, but he will need to take the time to acclimate back to having sound in that ear. His sensitivity to loud sound is due to years of not wearing hearing aids. Moishe may be a cochlear implant candidate but would need to see a CI specialist in [facility] or [place].  [name] reported understanding all that was discussed today.   Recommendations: 1. Amplification is necessary for both ears. Hearing aids can be purchased from a variety of locations. See provided list for locations in the [place] area.  2. Referral to ENT Physician necessary if pain or pressure arise. [name] has already received clearance for hearing aids and does not need to see an ENT at this time.    Alfonse Alpers  Audiologist,  Au.D., CCC-A 05/26/2020  12:31 PM  Cc: [name], NP

## 2020-05-27 ENCOUNTER — Ambulatory Visit: Payer: Medicare HMO

## 2020-05-27 ENCOUNTER — Other Ambulatory Visit: Payer: Self-pay

## 2020-05-27 DIAGNOSIS — M25552 Pain in left hip: Secondary | ICD-10-CM

## 2020-05-27 DIAGNOSIS — R29898 Other symptoms and signs involving the musculoskeletal system: Secondary | ICD-10-CM | POA: Diagnosis not present

## 2020-05-27 DIAGNOSIS — M5416 Radiculopathy, lumbar region: Secondary | ICD-10-CM

## 2020-05-27 NOTE — Therapy (Signed)
[facility] [address]  [place], [place], [address] Phone: [phone]   Fax:  [phone]  Physical Therapy Treatment  Patient Details  Name: [name] MRN: [id number] Date of Birth: [birth year] Referring Provider (PT): [name], NP   Encounter Date: 05/27/2020   PT End of Session - 05/27/20 0931    Visit Number 6    Number of Visits 13    Date for PT Re-Evaluation 06/23/20    Authorization Type Aetna Medicare    PT Start Time 0848    PT Stop Time 0928    PT Time Calculation (min) 40 min    Activity Tolerance Patient tolerated treatment well    Behavior During Therapy [facility] for tasks assessed/performed           Past Medical History:  Diagnosis Date  . Allergy   . Aortic stenosis   . CAD (coronary artery disease)    cabg  . Cancer [facility]) 2011   prostate  . Erectile dysfunction   . Heart murmur   . Hemochromatosis 02/14/2011  . Hemorrhoids   . History of gout   . History of hepatitis 1974  . Hyperlipidemia   . Hypertension   . Hypertr obst cardiomyop   . Obesity    moderate  . Sensorineural hearing loss   . Type II or unspecified type diabetes mellitus without mention of complication, not stated as uncontrolled     Past Surgical History:  Procedure Laterality Date  . AORTIC VALVE REPLACEMENT  11/14/2003   [facility]  . APPENDECTOMY  1990  . CHOLECYSTECTOMY  1990  . CORONARY ARTERY BYPASS GRAFT  10/2003  . HERNIA REPAIR  1999   right, inguinal  . HERNIA REPAIR  2002   left, inguinal  . HIP SURGERY  2006   right hip  . PILONIDAL CYST EXCISION  1964  . prostate seed implant  3/12  . TONSILLECTOMY  childhood    There were no vitals filed for this visit.   Subjective Assessment - 05/27/20 0850    Subjective Pt reports some discomfort in L hip when going to stand up.    Pertinent History DMII, hearing loss, obesity, HTN, HLD, gout, prostate CA 2011, CAD, CABG 2005, R  hip surgery 2006    Diagnostic tests none recent- waiting for MRI authorization    Patient Stated Goals decrease pain    Currently in Pain? No/denies              [facility] PT Assessment - 05/27/20 0001      Strength   Left Hip Flexion 4+/5    Left Hip ABduction 5/5    Left Hip ADduction 4+/5    Left Knee Flexion 4+/5    Left Knee Extension 4+/5                         [facility] Adult PT Treatment/Exercise - 05/27/20 0001      Exercises   Exercises Knee/Hip;Lumbar      Lumbar Exercises: Aerobic   Nustep L4 x 6 min (UEs/LEs)      Lumbar Exercises: Seated   Long Arc Quad on Chair Strengthening;Both;2 sets;10 reps;Weights    LAQ on Chair Weights (lbs) 2    Sit to Stand 20 reps    Sit to Stand Limitations w/o hand support      Knee/Hip Exercises: Standing   Hip Abduction Stengthening;Both;2 sets;10 reps;Knee straight  Abduction Limitations R Tband    Hip Extension Stengthening;Both;2 sets;10 reps;Knee straight    Extension Limitations R Tband    Functional Squat 1 set;10 reps    Functional Squat Limitations 2 HHA at elevated mat table      Knee/Hip Exercises: Seated   Hamstring Curl Strengthening;Both;1 set;10 reps    Hamstring Limitations R Tband                    PT Short Term Goals - 05/21/20 1021      PT SHORT TERM GOAL #1   Title Patient to be independent with initial HEP.    Time 3    Period Weeks    Status Achieved    Target Date 06/02/20             PT Long Term Goals - 05/27/20 0931      PT LONG TERM GOAL #1   Title Patient to be independent with advanced HEP.    Time 6    Period Weeks    Status On-going      PT LONG TERM GOAL #2   Title Patient to demonstrate B LE strength >/=4+/5.    Time 6    Period Weeks    Status Partially Met      PT LONG TERM GOAL #3   Title Patient to demonstrate lumbar AROM WFL and without pain limiting.    Time 6    Period Weeks    Status On-going      PT LONG TERM GOAL #4   Title  Patient to ascend/descend 13 steps with 1 handrail as needed with reciprocal pattern and good stability.    Time 6    Period Weeks    Status On-going      PT LONG TERM GOAL #5   Title Patient to report tolerance for supine positioning without pain limiting.    Time 6    Period Weeks    Status On-going                 Plan - 05/27/20 0626    Clinical Impression Statement Pt shows good improvements with therapy. He reports increased strength in his L knee and also has been more active at home which has led to improvements. Progressed pt to mostly WB exercises today. Instructed him on targeting the correct muscles during exercises and use of equipment at gym safely. Strength findings suggest overall 4+/5 and above in L LE muscles so LTG 2 is partially met. Still reports numbness along tib anterior muscle but reports that he is starting to get more feeling in it from the exercises.    Personal Factors and Comorbidities Age;Comorbidity 3+;Fitness;Past/Current Experience;Time since onset of injury/illness/exacerbation    Comorbidities DMII, hearing loss, obesity, HTN, HLD, gout, prostate CA 2011, CAD, CABG 2005, R hip surgery 2006    PT Frequency 2x / week    PT Duration 6 weeks    PT Treatment/Interventions ADLs/Self Care Home Management;Cryotherapy;Electrical Stimulation;Iontophoresis 76m/ml Dexamethasone;Moist Heat;Traction;Balance training;Therapeutic exercise;Therapeutic activities;Functional mobility training;Stair training;Gait training;Ultrasound;Neuromuscular re-education;Patient/family education;Manual techniques;Vasopneumatic Device;Taping;Energy conservation;Dry needling;Passive range of motion    PT Next Visit Plan progress hip flexor and quad strength; hip mobility    Consulted and Agree with Plan of Care Patient           Patient will benefit from skilled therapeutic intervention in order to improve the following deficits and impairments:  Hypomobility,Pain,Increased  fascial restricitons,Decreased strength,Decreased activity tolerance,Difficulty walking,Increased muscle spasms,Improper body mechanics,Decreased range  of motion,Impaired flexibility,Postural dysfunction  Visit Diagnosis: Radiculopathy, lumbar region  Pain in left hip  Other symptoms and signs involving the musculoskeletal system     Problem List Patient Active Problem List   Diagnosis Date Noted  . Type 2 diabetes mellitus with hyperglycemia, with long-term current use of insulin ([place]) 11/11/2018  . Type 2 diabetes mellitus with diabetic polyneuropathy, with long-term current use of insulin ([place]) 11/11/2018  . Type 2 diabetes mellitus with stage 3 chronic kidney disease, with long-term current use of insulin ([place]) 11/11/2018  . Diabetes mellitus ([place]) 11/11/2018  . Hx of CABG 11/05/2018  . Dyslipidemia 11/05/2018  . Primary osteoarthritis of left shoulder 10/31/2018  . Long term (current) use of anticoagulants 05/22/2018  . Hereditary hemochromatosis ([place]) 03/30/2016  . Gout 04/09/2015  . Ventral hernia 03/03/2014  . History of prostate cancer 11/28/2013  . OSA (obstructive sleep apnea) 07/12/2012  . DM (diabetes mellitus), type 2, uncontrolled with complications ([place]) 17/00/1749  . Hemochromatosis 02/14/2011  . Actinic keratosis 02/14/2011  . Hypertriglyceridemia 06/13/2010  . HEMORRHOIDS, EXTERNAL 03/21/2010  . History of aortic valve replacement 10/13/2009  . ADENOCARCINOMA, PROSTATE 06/17/2009  . TOBACCO USER 06/16/2009  . HOH (hard of hearing) 06/16/2009  . Essential hypertension 06/16/2009  . CORONARY ATHEROSCLEROSIS NATIVE CORONARY ARTERY 06/15/2009  . ERECTILE DYSFUNCTION, ORGANIC 05/18/2009  . Hypertrophic obstructive cardiomyopathy ([place]) 05/06/2008  . OBESITY, MODERATE 04/29/2008    Artist Pais, PTA 05/27/2020, 10:24 AM  [facility] [place], [place], [address] Phone:  [phone]   Fax:  [phone]  Name: ANDONI BUSCH MRN: [id number] Date of Birth: Jun 21, 1945

## 2020-05-28 ENCOUNTER — Encounter: Payer: Self-pay | Admitting: Internal Medicine

## 2020-05-31 ENCOUNTER — Encounter: Payer: Medicare HMO | Admitting: Physical Therapy

## 2020-06-02 ENCOUNTER — Encounter: Payer: Medicare HMO | Admitting: Gastroenterology

## 2020-06-02 ENCOUNTER — Ambulatory Visit (INDEPENDENT_AMBULATORY_CARE_PROVIDER_SITE_OTHER): Payer: Medicare HMO | Admitting: Family

## 2020-06-02 ENCOUNTER — Other Ambulatory Visit: Payer: Self-pay

## 2020-06-02 ENCOUNTER — Encounter: Payer: Self-pay | Admitting: Family

## 2020-06-02 VITALS — BP 135/67 | HR 69 | Temp 98.7°F | Resp 16 | Wt 207.0 lb

## 2020-06-02 DIAGNOSIS — E118 Type 2 diabetes mellitus with unspecified complications: Secondary | ICD-10-CM | POA: Diagnosis not present

## 2020-06-02 DIAGNOSIS — E785 Hyperlipidemia, unspecified: Secondary | ICD-10-CM

## 2020-06-02 DIAGNOSIS — E1122 Type 2 diabetes mellitus with diabetic chronic kidney disease: Secondary | ICD-10-CM

## 2020-06-02 DIAGNOSIS — H9193 Unspecified hearing loss, bilateral: Secondary | ICD-10-CM

## 2020-06-02 DIAGNOSIS — C61 Malignant neoplasm of prostate: Secondary | ICD-10-CM | POA: Diagnosis not present

## 2020-06-02 DIAGNOSIS — Z952 Presence of prosthetic heart valve: Secondary | ICD-10-CM

## 2020-06-02 DIAGNOSIS — E1165 Type 2 diabetes mellitus with hyperglycemia: Secondary | ICD-10-CM

## 2020-06-02 DIAGNOSIS — N183 Chronic kidney disease, stage 3 unspecified: Secondary | ICD-10-CM

## 2020-06-02 DIAGNOSIS — Z794 Long term (current) use of insulin: Secondary | ICD-10-CM

## 2020-06-02 DIAGNOSIS — I2510 Atherosclerotic heart disease of native coronary artery without angina pectoris: Secondary | ICD-10-CM | POA: Diagnosis not present

## 2020-06-02 DIAGNOSIS — IMO0002 Reserved for concepts with insufficient information to code with codable children: Secondary | ICD-10-CM

## 2020-06-02 DIAGNOSIS — I10 Essential (primary) hypertension: Secondary | ICD-10-CM | POA: Diagnosis not present

## 2020-06-02 LAB — COMPREHENSIVE METABOLIC PANEL
ALT: 24 U/L (ref 0–53)
AST: 31 U/L (ref 0–37)
Albumin: 4.3 g/dL (ref 3.5–5.2)
Alkaline Phosphatase: 61 U/L (ref 39–117)
BUN: 25 mg/dL — ABNORMAL HIGH (ref 6–23)
CO2: 29 mEq/L (ref 19–32)
Calcium: 9.4 mg/dL (ref 8.4–10.5)
Chloride: 98 mEq/L (ref 96–112)
Creatinine, Ser: 1.39 mg/dL (ref 0.40–1.50)
GFR: 49.7 mL/min — ABNORMAL LOW (ref >60.00)
Glucose, Bld: 334 mg/dL — ABNORMAL HIGH (ref 70–99)
Potassium: 4.3 mEq/L (ref 3.5–5.1)
Sodium: 136 mEq/L (ref 135–145)
Total Bilirubin: 0.6 mg/dL (ref 0.2–1.2)
Total Protein: 7.1 g/dL (ref 6.0–8.3)

## 2020-06-02 LAB — LIPID PANEL
Cholesterol: 165 mg/dL (ref 0–200)
HDL: 24.5 mg/dL — ABNORMAL LOW (ref >39.00)
Total CHOL/HDL Ratio: 7
Triglycerides: 484 mg/dL — ABNORMAL HIGH (ref 0.0–149.0)

## 2020-06-02 LAB — MICROALBUMIN / CREATININE URINE RATIO
Creatinine,U: 20.8 mg/dL
Microalb Creat Ratio: 3.4 mg/g (ref 0.0–30.0)
Microalb, Ur: <0.7 mg/dL (ref 0.0–1.9)

## 2020-06-02 LAB — LDL CHOLESTEROL, DIRECT: Direct LDL: 93 mg/dL

## 2020-06-02 NOTE — Assessment & Plan Note (Signed)
Maintained on coumadin which is being managed by the coumadin clinic

## 2020-06-02 NOTE — Assessment & Plan Note (Signed)
BP Readings from Last 3 Encounters:  06/02/20 135/67  05/18/20 134/70  04/20/20 130/65   Maintained on Toprol xl 25mg . BP stable. continue same.

## 2020-06-02 NOTE — Assessment & Plan Note (Signed)
Being managed by endocrinology. Plans to start using the freestyle Libre.

## 2020-06-02 NOTE — Assessment & Plan Note (Signed)
He continues to follow with Urology annually.

## 2020-06-02 NOTE — Progress Notes (Signed)
Subjective:   By signing my name below, I, [name], attest that this documentation has been prepared under the direction and in the presence of [name], NP. 06/02/2020      Patient ID: [name], male    DOB: Dec 30, 1945, 75 y.o.   MRN: [id number]  No chief complaint on file.   HPI Patient is in today for a office visit.  Lumbar radiculopathy-He is doing well on the 100 mg gabapentin twice daily to manage his lumbar radicular pain. Physical therapy has improved his movement and also seems to be helping with pain. He can now lay down in his bed without pain. He still has pain in his left posterior hip on occasion. He continuous to annually visit the urologist to for prostate cancer surveillance. He has a colonoscopy due soon. His audiologist prescribed him hearing aides to manage his hearing loss.  Diabetes-His blood glucose level has recently been elevated. He is working closely with endocrinology.  Lab Results  Component Value Date   HGBA1C 9.6 (A) 05/18/2020   Hypertension- He is taking 25 mg metoprolol succinate daily PO. BP Readings from Last 3 Encounters:  06/02/20 135/67  05/18/20 134/70  04/20/20 130/65   Hyperlipidemia- He is taking 40 mg Lipitor, 145 mg fenofibrate daily PO to manage his cholesterol levels.  Lab Results  Component Value Date   CHOL 172 09/22/2019   HDL 26 (L) 09/22/2019   LDLCALC 99 09/22/2019   LDLDIRECT 83.0 09/06/2018   TRIG 355 (H) 09/22/2019   CHOLHDL 6.6 (H) 09/22/2019     Past Medical History:  Diagnosis Date  . Allergy   . Aortic stenosis   . CAD (coronary artery disease)    cabg  . Cancer [facility]) 2011   prostate  . Erectile dysfunction   . Heart murmur   . Hemochromatosis 02/14/2011  . Hemorrhoids   . History of gout   . History of hepatitis 1974  . Hyperlipidemia   . Hypertension   . Hypertr obst cardiomyop   . Obesity    moderate  . Sensorineural hearing loss   . Type II or unspecified type diabetes  mellitus without mention of complication, not stated as uncontrolled     Past Surgical History:  Procedure Laterality Date  . AORTIC VALVE REPLACEMENT  11/14/2003   [facility]  . APPENDECTOMY  1990  . CHOLECYSTECTOMY  1990  . CORONARY ARTERY BYPASS GRAFT  10/2003  . HERNIA REPAIR  1999   right, inguinal  . HERNIA REPAIR  2002   left, inguinal  . HIP SURGERY  2006   right hip  . PILONIDAL CYST EXCISION  1964  . prostate seed implant  3/12  . TONSILLECTOMY  childhood    Family History  Problem Relation Age of Onset  . Heart disease Mother   . Stroke Mother   . Heart disease Father   . Asperger's syndrome Son   . Hyperlipidemia Son   . Coronary artery disease Brother   . Cancer Neg Hx        negative for colon cancer    Social History   Socioeconomic History  . Marital status: Married    Spouse name: Not on file  . Number of children: Not on file  . Years of education: Not on file  . Highest education level: Not on file  Occupational History  . Not on file  Tobacco Use  . Smoking status: Light Tobacco Smoker    Types: Cigars  .  Smokeless tobacco: Never Used  Substance and Sexual Activity  . Alcohol use: Yes    Alcohol/week: 0.0 standard drinks    Comment: 4 glasses wine a month  . Drug use: No  . Sexual activity: Never  Other Topics Concern  . Not on file  Social History Narrative  . Not on file   Social Determinants of Health   Financial Resource Strain: Not on file  Food Insecurity: Not on file  Transportation Needs: Not on file  Physical Activity: Not on file  Stress: Not on file  Social Connections: Not on file  Intimate Partner Violence: Not on file    Outpatient Medications Prior to Visit  Medication Sig Dispense Refill  . AMBULATORY NON FORMULARY MEDICATION cpap cushions            AirFit F20 (Size: Large)           AirFit F30 (Size: Med)  Please FAX the prescription to:  [phone] 12 each prn  . aspirin EC 81 MG tablet Take 1  tablet (81 mg total) by mouth daily. 90 tablet 3  . atorvastatin (LIPITOR) 40 MG tablet Take 1 tablet (40 mg total) by mouth daily. 90 tablet 3  . betamethasone dipropionate (DIPROLENE) 0.05 % cream Apply topically 2 (two) times daily as needed. To eczema rash 30 g 1  . Blood Glucose Monitoring Suppl Supplies MISC Use for monitoring glucose level 100 each 1  . colchicine 0.6 MG tablet TAKE 2 TABS BY MOUTH NOW AND THEN 1 TAB IN 1 HOUR FOR GOUT. (MAX 3 TABS/24 HRS) 9 tablet 1  . Continuous Blood Gluc Sensor (DEXCOM G6 SENSOR) MISC 1 Device by Does not apply route as directed. 9 each 3  . Continuous Blood Gluc Transmit (DEXCOM G6 TRANSMITTER) MISC 1 Device by Does not apply route as directed. 1 each 3  . Dulaglutide (TRULICITY) 3 MG/0.5ML SOPN Inject 3 mg as directed once a week. 2 mL 6  . fenofibrate (TRICOR) 145 MG tablet Take 1 tablet (145 mg total) by mouth daily. 90 tablet 0  . furosemide (LASIX) 20 MG tablet Take 1 tablet (20 mg total) by mouth daily as needed. For swelling 90 tablet 0  . gabapentin (NEURONTIN) 100 MG capsule Take 1 capsule (100 mg total) by mouth 3 (three) times daily as needed. 90 capsule 3  . imiquimod (ALDARA) 5 % cream Apply topically 3 (three) times a week.    . insulin aspart (NOVOLOG FLEXPEN) 100 UNIT/ML FlexPen Inject 10 Units into the skin 3 (three) times daily with meals. 30 mL 3  . Insulin Glargine (BASAGLAR KWIKPEN) 100 UNIT/ML Inject 38 Units into the skin daily. 30 mL 4  . Insulin Pen Needle 32G X 4 MM MISC 1 Device by Does not apply route in the morning, at noon, in the evening, and at bedtime. 400 each 3  . metoprolol succinate (TOPROL-XL) 25 MG 24 hr tablet TAKE 1 TABLET BY MOUTH EVERY DAY 90 tablet 1  . Na Sulfate-K Sulfate-Mg Sulf (SUPREP BOWEL PREP KIT) 17.5-3.13-1.6 GM/177ML SOLN Take 1 kit by mouth as directed. 324 mL 0  . OneTouch Delica Lancets 33G MISC USE AS DIRECTED 100 each 1  . ONETOUCH VERIO test strip USE AS INSTRUCTED TO CHECK BLOOD SUGAR TWICE A  DAY. DX E11.8 100 strip 12  . warfarin (COUMADIN) 7.5 MG tablet Take 1-2 tablets Daily or as prescribed by [facility] 90 tablet 2   No facility-administered medications prior to visit.      No Known Allergies  ROS     Objective:    Physical Exam Constitutional:      Appearance: Normal appearance.  Cardiovascular:     Rate and Rhythm: Normal rate and regular rhythm.     Pulses: Normal pulses.     Comments: Click noted from mechanical heart valve Pulmonary:     Effort: Pulmonary effort is normal. No respiratory distress.     Breath sounds: Normal breath sounds. No wheezing, rhonchi or rales.  Musculoskeletal:     Right lower leg: 2+ Edema present.     Left lower leg: 2+ Edema present.  Neurological:     Mental Status: He is alert.  Psychiatric:        Attention and Perception: Attention normal.        Mood and Affect: Mood normal.        Speech: Speech normal.        Behavior: Behavior normal.     There were no vitals taken for this visit. Wt Readings from Last 3 Encounters:  05/18/20 211 lb 4 oz (95.8 kg)  04/20/20 203 lb (92.1 kg)  04/05/20 214 lb (97.1 kg)    Diabetic Foot Exam - Simple   No data filed    Lab Results  Component Value Date   WBC 8.3 04/10/2019   HGB 13.4 04/10/2019   HCT 39.3 04/10/2019   PLT 248 04/10/2019   GLUCOSE 134 (H) 09/22/2019   CHOL 172 09/22/2019   TRIG 355 (H) 09/22/2019   HDL 26 (L) 09/22/2019   LDLDIRECT 83.0 09/06/2018   LDLCALC 99 09/22/2019   ALT 20 04/10/2019   AST 25 04/10/2019   NA 137 09/22/2019   K 5.1 09/22/2019   CL 101 09/22/2019   CREATININE 1.63 (H) 09/22/2019   BUN 38 (H) 09/22/2019   CO2 28 09/22/2019   TSH 2.251 06/16/2009   PSA 13.88 (H) 06/16/2009   INR 2.5 04/28/2020   HGBA1C 9.6 (A) 05/18/2020   MICROALBUR 8.7 (H) 09/09/2014    Lab Results  Component Value Date   TSH 2.251 06/16/2009   Lab Results  Component Value Date   WBC 8.3 04/10/2019   HGB 13.4 04/10/2019   HCT 39.3 04/10/2019    MCV 89.7 04/10/2019   PLT 248 04/10/2019   Lab Results  Component Value Date   NA 137 09/22/2019   K 5.1 09/22/2019   CHLORIDE 106 09/23/2015   CO2 28 09/22/2019   GLUCOSE 134 (H) 09/22/2019   BUN 38 (H) 09/22/2019   CREATININE 1.63 (H) 09/22/2019   BILITOT 0.5 04/10/2019   ALKPHOS 34 (L) 04/10/2019   AST 25 04/10/2019   ALT 20 04/10/2019   PROT 7.5 04/10/2019   ALBUMIN 4.6 04/10/2019   CALCIUM 9.9 09/22/2019   ANIONGAP 9 04/10/2019   EGFR 55 (L) 09/23/2015   GFR 41.88 (L) 01/14/2019   Lab Results  Component Value Date   CHOL 172 09/22/2019   Lab Results  Component Value Date   HDL 26 (L) 09/22/2019   Lab Results  Component Value Date   LDLCALC 99 09/22/2019   Lab Results  Component Value Date   TRIG 355 (H) 09/22/2019   Lab Results  Component Value Date   CHOLHDL 6.6 (H) 09/22/2019   Lab Results  Component Value Date   HGBA1C 9.6 (A) 05/18/2020       Assessment & Plan:   Problem List Items Addressed This Visit   None      No   orders of the defined types were placed in this encounter.   I, [name], personally preformed the services described in this documentation.  All medical record entries made by the scribe were at my direction and in my presence.  I have reviewed the chart and discharge instructions (if applicable) and agree that the record reflects my personal performance and is accurate and complete. 06/02/2020   I,[name],acting as a scribe for [name] S O'Sullivan, NP.,have documented all relevant documentation on the behalf of [name] S O'Sullivan, NP,as directed by  [name] S O'Sullivan, NP while in the presence of [name] S O'Sullivan, NP.   [name] 

## 2020-06-02 NOTE — Patient Instructions (Signed)
Please complete lab work prior to leaving.   

## 2020-06-02 NOTE — Assessment & Plan Note (Addendum)
Maintained on lipitor 40mg  and fenofibrate 145mg . Check follow up lipid panel.   Lab Results  Component Value Date   CHOL 172 09/22/2019   HDL 26 (L) 09/22/2019   LDLCALC 99 09/22/2019   LDLDIRECT 83.0 09/06/2018   TRIG 355 (H) 09/22/2019   CHOLHDL 6.6 (H) 09/22/2019

## 2020-06-02 NOTE — Assessment & Plan Note (Addendum)
He will set up an appointment with his cardiologist. Clinically stable.

## 2020-06-02 NOTE — Assessment & Plan Note (Signed)
He had a recent audiology evaluation and hearing aid was recommended for 1 ear. He plans to try to get this through the [place] as it would be cheaper for him.

## 2020-06-03 ENCOUNTER — Encounter: Payer: Medicare HMO | Admitting: Physical Therapy

## 2020-06-04 ENCOUNTER — Ambulatory Visit: Payer: Medicare HMO

## 2020-06-10 ENCOUNTER — Encounter: Payer: Medicare HMO | Admitting: Physical Therapy

## 2020-06-11 ENCOUNTER — Other Ambulatory Visit: Payer: Self-pay

## 2020-06-11 ENCOUNTER — Ambulatory Visit (INDEPENDENT_AMBULATORY_CARE_PROVIDER_SITE_OTHER): Payer: Medicare HMO

## 2020-06-11 DIAGNOSIS — Z5181 Encounter for therapeutic drug level monitoring: Secondary | ICD-10-CM

## 2020-06-11 DIAGNOSIS — I359 Nonrheumatic aortic valve disorder, unspecified: Secondary | ICD-10-CM | POA: Diagnosis not present

## 2020-06-11 DIAGNOSIS — Z7901 Long term (current) use of anticoagulants: Secondary | ICD-10-CM

## 2020-06-11 DIAGNOSIS — Z952 Presence of prosthetic heart valve: Secondary | ICD-10-CM

## 2020-06-11 LAB — POCT INR: INR: 1.8 — AB (ref 2.0–3.0)

## 2020-06-11 NOTE — Patient Instructions (Signed)
Take 2 tablets today only and then Continue with 1 tablet each Monday, Wednesday and Friday, 1/2 tablet all other days.  Repeat INR in 4 weeks

## 2020-06-14 ENCOUNTER — Other Ambulatory Visit: Payer: Self-pay

## 2020-06-14 ENCOUNTER — Ambulatory Visit: Payer: Medicare HMO | Attending: Family

## 2020-06-14 DIAGNOSIS — R29898 Other symptoms and signs involving the musculoskeletal system: Secondary | ICD-10-CM

## 2020-06-14 DIAGNOSIS — M5416 Radiculopathy, lumbar region: Secondary | ICD-10-CM | POA: Diagnosis not present

## 2020-06-14 DIAGNOSIS — M25552 Pain in left hip: Secondary | ICD-10-CM | POA: Diagnosis not present

## 2020-06-14 NOTE — Therapy (Signed)
[facility] [address]  [place], [place], [address] Phone: [phone]   Fax:  [phone]  Physical Therapy Treatment  Patient Details  Name: [name] MRN: [id number] Date of Birth: [birth year] Referring Provider (PT): [name], NP   Encounter Date: 06/14/2020   PT End of Session - 06/14/20 1108    Visit Number 7    Number of Visits 13    Date for PT Re-Evaluation 06/23/20    Authorization Type [employer]    PT Start Time 1017    PT Stop Time 1058    PT Time Calculation (min) 41 min    Activity Tolerance Patient tolerated treatment well    Behavior During Therapy [facility] for tasks assessed/performed           Past Medical History:  Diagnosis Date  . Allergy   . Aortic stenosis   . CAD (coronary artery disease)    cabg  . Cancer [facility]) 2011   prostate  . Erectile dysfunction   . Heart murmur   . Hemochromatosis 02/14/2011  . Hemorrhoids   . History of gout   . History of hepatitis 1974  . Hyperlipidemia   . Hypertension   . Hypertr obst cardiomyop   . Obesity    moderate  . Sensorineural hearing loss   . Type II or unspecified type diabetes mellitus without mention of complication, not stated as uncontrolled     Past Surgical History:  Procedure Laterality Date  . AORTIC VALVE REPLACEMENT  11/14/2003   [facility]  . APPENDECTOMY  1990  . CHOLECYSTECTOMY  1990  . CORONARY ARTERY BYPASS GRAFT  10/2003  . HERNIA REPAIR  1999   right, inguinal  . HERNIA REPAIR  2002   left, inguinal  . HIP SURGERY  2006   right hip  . PILONIDAL CYST EXCISION  1964  . prostate seed implant  3/12  . TONSILLECTOMY  childhood    There were no vitals filed for this visit.   Subjective Assessment - 06/14/20 1020    Subjective Pt has been walking and on vacation, doing well.    Patient is accompained by: Family member    Pertinent History DMII, hearing loss, obesity, HTN, HLD, gout,  prostate CA 2011, CAD, CABG 2005, R hip surgery 2006    Diagnostic tests none recent- waiting for MRI authorization    Patient Stated Goals decrease pain                             [facility] Adult PT Treatment/Exercise - 06/14/20 0001      Exercises   Exercises Knee/Hip;Lumbar      Lumbar Exercises: Aerobic   Recumbent Bike L4x39mn      Lumbar Exercises: Machines for Strengthening   Cybex Knee Extension 2x10 20# B LE    Cybex Knee Flexion 2x10 35# B LE      Lumbar Exercises: Standing   Functional Squats 10 reps    Functional Squats Limitations counter support lots of cueing for technique      Lumbar Exercises: Seated   Sit to Stand 20 reps    Sit to Stand Limitations blue medicine ball to chest      Lumbar Exercises: Supine   Bridge 10 reps;3 seconds    Bridge Limitations orange pball, straight leg    Straight Leg Raise 10 reps    Straight Leg Raises  Limitations cueing for TKE                    PT Short Term Goals - 05/21/20 1021      PT SHORT TERM GOAL #1   Title Patient to be independent with initial HEP.    Time 3    Period Weeks    Status Achieved    Target Date 06/02/20             PT Long Term Goals - 06/14/20 1050      PT LONG TERM GOAL #1   Title Patient to be independent with advanced HEP.    Time 6    Period Weeks    Status On-going      PT LONG TERM GOAL #2   Title Patient to demonstrate B LE strength >/=4+/5.    Time 6    Period Weeks    Status Partially Met      PT LONG TERM GOAL #3   Title Patient to demonstrate lumbar AROM WFL and without pain limiting.    Time 6    Period Weeks    Status On-going      PT LONG TERM GOAL #4   Title Patient to ascend/descend 13 steps with 1 handrail as needed with reciprocal pattern and good stability.    Time 6    Period Weeks    Status On-going      PT LONG TERM GOAL #5   Title Patient to report tolerance for supine positioning without pain limiting.    Time 6     Period Weeks    Status Achieved                 Plan - 06/14/20 1109    Clinical Impression Statement Pt notes that he went to the beach and walked around w/o use of AD. Observed him ambulating around clinic and he appeared to have good stability but does note having to hold onto certain object just to keep balance at times in stores. Pt required a lot of cueing to increase posterior WS during squats to reduce stress on knees. Pt reports continued improvements in LE strength d/t him not using the walker anymore but does note sometimes needing to hold onto certain objects for balance. He would like to continue working on balance going foward along with LE strength.    Personal Factors and Comorbidities Age;Comorbidity 3+;Fitness;Past/Current Experience;Time since onset of injury/illness/exacerbation    Comorbidities DMII, hearing loss, obesity, HTN, HLD, gout, prostate CA 2011, CAD, CABG 2005, R hip surgery 2006    PT Frequency 2x / week    PT Duration 6 weeks    PT Treatment/Interventions ADLs/Self Care Home Management;Cryotherapy;Electrical Stimulation;Iontophoresis 81m/ml Dexamethasone;Moist Heat;Traction;Balance training;Therapeutic exercise;Therapeutic activities;Functional mobility training;Stair training;Gait training;Ultrasound;Neuromuscular re-education;Patient/family education;Manual techniques;Vasopneumatic Device;Taping;Energy conservation;Dry needling;Passive range of motion    PT Next Visit Plan Balance exercises; progress hip flexor and quad strength; hip mobility    Consulted and Agree with Plan of Care Patient           Patient will benefit from skilled therapeutic intervention in order to improve the following deficits and impairments:  Hypomobility,Pain,Increased fascial restricitons,Decreased strength,Decreased activity tolerance,Difficulty walking,Increased muscle spasms,Improper body mechanics,Decreased range of motion,Impaired flexibility,Postural dysfunction  Visit  Diagnosis: Radiculopathy, lumbar region  Pain in left hip  Other symptoms and signs involving the musculoskeletal system     Problem List Patient Active Problem List   Diagnosis Date Noted  . Type 2 diabetes  mellitus with diabetic polyneuropathy, with long-term current use of insulin ([place]) 11/11/2018  . Type 2 diabetes mellitus with stage 3 chronic kidney disease, with long-term current use of insulin ([place]) 11/11/2018  . Hx of CABG 11/05/2018  . Dyslipidemia 11/05/2018  . Primary osteoarthritis of left shoulder 10/31/2018  . Long term (current) use of anticoagulants 05/22/2018  . Hereditary hemochromatosis ([place]) 03/30/2016  . Gout 04/09/2015  . Ventral hernia 03/03/2014  . History of prostate cancer 11/28/2013  . OSA (obstructive sleep apnea) 07/12/2012  . DM (diabetes mellitus), type 2, uncontrolled with complications ([place]) 00/86/7619  . Hemochromatosis 02/14/2011  . Actinic keratosis 02/14/2011  . Hypertriglyceridemia 06/13/2010  . HEMORRHOIDS, EXTERNAL 03/21/2010  . History of aortic valve replacement 10/13/2009  . ADENOCARCINOMA, PROSTATE 06/17/2009  . TOBACCO USER 06/16/2009  . HOH (hard of hearing) 06/16/2009  . Essential hypertension 06/16/2009  . CORONARY ATHEROSCLEROSIS NATIVE CORONARY ARTERY 06/15/2009  . ERECTILE DYSFUNCTION, ORGANIC 05/18/2009  . Hypertrophic obstructive cardiomyopathy ([place]) 05/06/2008  . OBESITY, MODERATE 04/29/2008    Artist Pais, PTA 06/14/2020, 12:02 PM  [facility] [address]  [place], [place], [address] Phone: [phone]   Fax:  [phone]  Name: [name] MRN: [id number] Date of Birth: 08-07-1945

## 2020-06-16 ENCOUNTER — Ambulatory Visit: Payer: Medicare HMO | Admitting: Physical Therapy

## 2020-06-16 ENCOUNTER — Encounter: Payer: Self-pay | Admitting: Physical Therapy

## 2020-06-16 ENCOUNTER — Other Ambulatory Visit: Payer: Self-pay

## 2020-06-16 DIAGNOSIS — M25552 Pain in left hip: Secondary | ICD-10-CM | POA: Diagnosis not present

## 2020-06-16 DIAGNOSIS — M5416 Radiculopathy, lumbar region: Secondary | ICD-10-CM

## 2020-06-16 DIAGNOSIS — R29898 Other symptoms and signs involving the musculoskeletal system: Secondary | ICD-10-CM

## 2020-06-16 NOTE — Therapy (Addendum)
[facility] [address]  [place], [place], [address] Phone: [phone]   Fax:  [phone]  Physical Therapy Progress Note  Patient Details  Name: [name] MRN: [id number] Date of Birth: 04/17/1945 Referring Provider (PT): [name], NP   Progress Note Reporting Period 05/12/20 to 06/16/20  See note below for Objective Data and Assessment of Progress/Goals.      Encounter Date: 06/16/2020   PT End of Session - 06/16/20 1213     Visit Number 8    Number of Visits 13    Date for PT Re-Evaluation 06/23/20    Authorization Type [employer]    PT Start Time 0927    PT Stop Time 1013    PT Time Calculation (min) 46 min    Equipment Utilized During Treatment Gait belt    Activity Tolerance Patient tolerated treatment well    Behavior During Therapy WFL for tasks assessed/performed             Past Medical History:  Diagnosis Date   Allergy    Aortic stenosis    CAD (coronary artery disease)    cabg   Cancer ([place]) 2011   prostate   Erectile dysfunction    Heart murmur    Hemochromatosis 02/14/2011   Hemorrhoids    History of gout    History of hepatitis 1974   Hyperlipidemia    Hypertension    Hypertr obst cardiomyop    Obesity    moderate   Sensorineural hearing loss    Type II or unspecified type diabetes mellitus without mention of complication, not stated as uncontrolled     Past Surgical History:  Procedure Laterality Date   AORTIC VALVE REPLACEMENT  11/14/2003   [facility]   APPENDECTOMY  1990   CHOLECYSTECTOMY  1990   CORONARY ARTERY BYPASS GRAFT  10/2003   HERNIA REPAIR  1999   right, inguinal   HERNIA REPAIR  2002   left, inguinal   HIP SURGERY  2006   right hip   PILONIDAL CYST EXCISION  1964   prostate seed implant  3/12   TONSILLECTOMY  childhood    There were no vitals filed for this visit.   Subjective Assessment - 06/16/20 0929      Subjective 99% of the N/T is gone and knee pain is gone. Sometimes hip still gives him some pain. [place] able [place] walk [place]25 miles [place] the beach.    Pertinent History DMII, hearing loss, obesity, HTN, HLD, gout, prostate CA 2011, CAD, CABG 2005, R hip surgery 2006    Diagnostic tests none recent- waiting for MRI authorization    Patient Stated Goals decrease pain    Currently in Pain? No/denies                [facility] PT Assessment - 06/16/20 0933       Assessment   Medical Diagnosis Lumbar Radiculopathy    Referring Provider (PT) [name], NP    Onset Date/Surgical Date 03/29/20      Observation/Other Assessments   Focus on Therapeutic Outcomes (FOTO)  Lumbar: 84      AROM   Lumbar Flexion ankles   discomfort in LB   Lumbar Extension moderately limited    Lumbar - Right Side Bend distal thigh    Lumbar - Left Side Bend distal thigh    Lumbar - Right Rotation mildly limited    Lumbar - Left Rotation  mildly limited      Strength   Right Hip Flexion 4+/5    Right Hip ABduction 4+/5    Right Hip ADduction 4+/5    Left Hip Flexion 4+/5    Left Hip ABduction 4+/5    Left Hip ADduction 4+/5    Right Knee Flexion 5/5    Right Knee Extension 5/5    Left Knee Flexion 4+/5    Left Knee Extension 5/5    Right Ankle Dorsiflexion 5/5    Right Ankle Plantar Flexion 4+/5    Left Ankle Dorsiflexion 5/5    Left Ankle Plantar Flexion 4+/5      Standardized Balance Assessment   Standardized Balance Assessment Dynamic Gait Index      Dynamic Gait Index   Level Surface Normal    Change in Gait Speed Normal    Gait with Horizontal Head Turns Normal    Gait with Vertical Head Turns Normal    Gait and Pivot Turn Normal    Step Over Obstacle Normal    Step Around Obstacles Mild Impairment    Steps Mild Impairment    Total Score 22                           [facility] Adult PT Treatment/Exercise - 06/16/20 0001       Ambulation/Gait   Stairs Yes    Stairs  Assistance 7: Independent    Stair Management Technique One rail Right;Alternating pattern    Number of Stairs 26    Height of Stairs 8    Gait Comments slightly limited eccentric control on L LE      Lumbar Exercises: Aerobic   Nustep L4 x 6 min (UEs/LEs)      Lumbar Exercises: Standing   Other Standing Lumbar Exercises lumbar extension at wall 5x3"                    PT Education - 06/16/20 1016     Education Details consolidation to HEP; discussion on objective progress; discussion and clinical relevance of balance testing today    Person(s) Educated Patient    Methods Explanation;Demonstration;Tactile cues;Verbal cues;Handout    Comprehension Verbalized understanding;Returned demonstration              PT Short Term Goals - 06/16/20 1213       PT SHORT TERM GOAL #1   Title Patient to be independent with initial HEP.    Time 3    Period Weeks    Status Achieved    Target Date 06/02/20               PT Long Term Goals - 06/16/20 1214       PT LONG TERM GOAL #1   Title Patient to be independent with advanced HEP.    Time 6    Period Weeks    Status Achieved      PT LONG TERM GOAL #2   Title Patient to demonstrate B LE strength >/=4+/5.    Time 6    Period Weeks    Status Achieved      PT LONG TERM GOAL #3   Title Patient to demonstrate lumbar AROM WFL and without pain limiting.    Time 6    Period Weeks    Status Achieved      PT LONG TERM GOAL #4   Title Patient to ascend/descend 13 steps with 1 handrail as needed  with reciprocal pattern and good stability.    Time 6    Period Weeks    Status Partially Met   very mild remaining quad instability on L, but good safety     PT LONG TERM GOAL #5   Title Patient to report tolerance for supine positioning without pain limiting.    Time 6    Period Weeks    Status Achieved                   Plan - 06/16/20 1213     Clinical Impression Statement Patient reports that 99% of  the N/T down the L LE is gone and knee pain is gone. Sometimes hip still gives him some pain. Notes that he was able to walk 1.25 miles on the beach while on vacation. Strength goal has been met. Lumbar AROM has improved in B sidebending and rotation and is now pain-free. Patient able to navigate stairs with alternating reciprocal pattern with good stability and no pain; still with slightly limited eccentric control on the L LE when descending. Assessed balance d/t patient's concern, with patient scoring 22/24 on DGI, indicating a decreased risk of falls. Upon discussion, patient reporting lightheadedness with transfers, thus educated patient on decreasing speed of transfers to avoid falls- patient reported understanding. Updated and consolidated HEP for max benefit and continued fitness- patient reported understanding and without complaints at end of session. Patient has met all goals at this time and is now ready for 30 day hold.    Personal Factors and Comorbidities Age;Comorbidity 3+;Fitness;Past/Current Experience;Time since onset of injury/illness/exacerbation    Comorbidities DMII, hearing loss, obesity, HTN, HLD, gout, prostate CA 2011, CAD, CABG 2005, R hip surgery 2006    PT Frequency 2x / week    PT Duration 6 weeks    PT Treatment/Interventions ADLs/Self Care Home Management;Cryotherapy;Electrical Stimulation;Iontophoresis 4mg /ml Dexamethasone;Moist Heat;Traction;Balance training;Therapeutic exercise;Therapeutic activities;Functional mobility training;Stair training;Gait training;Ultrasound;Neuromuscular re-education;Patient/family education;Manual techniques;Vasopneumatic Device;Taping;Energy conservation;Dry needling;Passive range of motion    PT Next Visit Plan 30 day hold at this time    Consulted and Agree with Plan of Care Patient             Patient will benefit from skilled therapeutic intervention in order to improve the following deficits and impairments:   Hypomobility,Pain,Increased fascial restricitons,Decreased strength,Decreased activity tolerance,Difficulty walking,Increased muscle spasms,Improper body mechanics,Decreased range of motion,Impaired flexibility,Postural dysfunction  Visit Diagnosis: Radiculopathy, lumbar region  Pain in left hip  Other symptoms and signs involving the musculoskeletal system     Problem List Patient Active Problem List   Diagnosis Date Noted   Type 2 diabetes mellitus with diabetic polyneuropathy, with long-term current use of insulin ([place]) 11/11/2018   Type 2 diabetes mellitus with stage 3 chronic kidney disease, with long-term current use of insulin ([place]) 11/11/2018   Hx of CABG 11/05/2018   Dyslipidemia 11/05/2018   Primary osteoarthritis of left shoulder 10/31/2018   Long term (current) use of anticoagulants 05/22/2018   Hereditary hemochromatosis ([place]) 03/30/2016   Gout 04/09/2015   Ventral hernia 03/03/2014   History of prostate cancer 11/28/2013   OSA (obstructive sleep apnea) 07/12/2012   DM (diabetes mellitus), type 2, uncontrolled with complications ([place]) 04/20/2246   Hemochromatosis 02/14/2011   Actinic keratosis 02/14/2011   Hypertriglyceridemia 06/13/2010   HEMORRHOIDS, EXTERNAL 03/21/2010   History of aortic valve replacement 10/13/2009   ADENOCARCINOMA, PROSTATE 06/17/2009   TOBACCO USER 06/16/2009   HOH (hard of hearing) 06/16/2009   Essential hypertension 06/16/2009   CORONARY  ATHEROSCLEROSIS NATIVE CORONARY ARTERY 06/15/2009   ERECTILE DYSFUNCTION, ORGANIC 05/18/2009   Hypertrophic obstructive cardiomyopathy ([place]) 05/06/2008   OBESITY, MODERATE 04/29/2008     Janene Harvey, PT, DPT 06/16/20 12:16 PM   [facility] [address]  [place], [place], [address] Phone: [phone]   Fax:  [phone]  Name: [name] MRN: [id number] Date of Birth: 08-16-1945   PHYSICAL THERAPY DISCHARGE  SUMMARY  Visits from Start of Care: 8  Current functional level related to goals / functional outcomes: See above clinical impression; patient did not return during hold   Remaining deficits: Mild quad instability with stairs   Education / Equipment: HEP  Plan: Patient agrees to discharge.  Patient goals were partially met. Patient is being discharged due to meeting the stated rehab goals.      Janene Harvey, PT, DPT 07/23/20 10:16 AM

## 2020-06-17 ENCOUNTER — Other Ambulatory Visit: Payer: Self-pay | Admitting: Family

## 2020-06-17 DIAGNOSIS — I152 Hypertension secondary to endocrine disorders: Secondary | ICD-10-CM

## 2020-06-17 DIAGNOSIS — E1159 Type 2 diabetes mellitus with other circulatory complications: Secondary | ICD-10-CM

## 2020-06-18 ENCOUNTER — Encounter: Payer: Self-pay | Admitting: Family

## 2020-06-18 ENCOUNTER — Telehealth: Payer: Self-pay | Admitting: Family

## 2020-06-18 MED ORDER — PREDNISONE 20 MG PO TABS: 60.0000 mg | ORAL_TABLET | Freq: Every day | ORAL | 0 refills | Status: AC

## 2020-06-18 NOTE — Telephone Encounter (Signed)
Received pt's note re: loss of hearing early this AM. I did reach out to Dr. Deeann Saint office- no provider in office today, Dr. Pollie Friar office (kept getting after hours voicemail even after 9AM).  I then reached out to [facility]. They took pt's information and said that they would reach out to the patient to schedule an office visit today. I tried pt and wife on all numbers listed at 8 am and again now at 12:25.  No answer. He has not read [employer]. Left phone message on home phone to send me a message via mychart letting me know if he has an appointment today at ENT.  I am concerned that they may not have been able to get in touch with the patient either.

## 2020-06-18 NOTE — Telephone Encounter (Signed)
Per patient's wife he was scheduled to see ENT Monday at 11:40 am.

## 2020-06-18 NOTE — Telephone Encounter (Signed)
Spoke to patient's wife and she reports they were out and just got back home and saw the messages. She will call ENT to see if they still have any appointments for today.

## 2020-06-18 NOTE — Addendum Note (Signed)
Addended by: [name] on: 06/18/2020 04:59 PM   Modules accepted: Orders

## 2020-06-18 NOTE — Telephone Encounter (Signed)
Rx sent, wife informed to keep an eye on the pt's sugars and call if >300. He has apt Monday with ENT.

## 2020-06-21 ENCOUNTER — Ambulatory Visit (INDEPENDENT_AMBULATORY_CARE_PROVIDER_SITE_OTHER): Payer: Medicare HMO

## 2020-06-21 ENCOUNTER — Encounter: Payer: Self-pay | Admitting: Family

## 2020-06-21 ENCOUNTER — Other Ambulatory Visit: Payer: Self-pay

## 2020-06-21 DIAGNOSIS — H90A21 Sensorineural hearing loss, unilateral, right ear, with restricted hearing on the contralateral side: Secondary | ICD-10-CM | POA: Insufficient documentation

## 2020-06-21 DIAGNOSIS — I359 Nonrheumatic aortic valve disorder, unspecified: Secondary | ICD-10-CM | POA: Diagnosis not present

## 2020-06-21 DIAGNOSIS — H903 Sensorineural hearing loss, bilateral: Secondary | ICD-10-CM | POA: Diagnosis not present

## 2020-06-21 DIAGNOSIS — Z5181 Encounter for therapeutic drug level monitoring: Secondary | ICD-10-CM

## 2020-06-21 DIAGNOSIS — R69 Illness, unspecified: Secondary | ICD-10-CM | POA: Diagnosis not present

## 2020-06-21 DIAGNOSIS — Z952 Presence of prosthetic heart valve: Secondary | ICD-10-CM

## 2020-06-21 DIAGNOSIS — Z7901 Long term (current) use of anticoagulants: Secondary | ICD-10-CM

## 2020-06-21 LAB — POCT INR: INR: 2 (ref 2.0–3.0)

## 2020-06-21 NOTE — Patient Instructions (Signed)
Continue with 1 tablet each Monday, Wednesday and Friday, 1/2 tablet all other days.  Repeat INR in 2 weeks

## 2020-06-23 ENCOUNTER — Encounter: Payer: Medicare HMO | Admitting: Physical Therapy

## 2020-06-23 DIAGNOSIS — H9121 Sudden idiopathic hearing loss, right ear: Secondary | ICD-10-CM | POA: Diagnosis not present

## 2020-06-29 ENCOUNTER — Encounter: Payer: Self-pay | Admitting: Internal Medicine

## 2020-06-29 DIAGNOSIS — L821 Other seborrheic keratosis: Secondary | ICD-10-CM | POA: Diagnosis not present

## 2020-06-29 DIAGNOSIS — Z85828 Personal history of other malignant neoplasm of skin: Secondary | ICD-10-CM | POA: Diagnosis not present

## 2020-06-29 DIAGNOSIS — X32XXXS Exposure to sunlight, sequela: Secondary | ICD-10-CM | POA: Diagnosis not present

## 2020-06-29 DIAGNOSIS — L814 Other melanin hyperpigmentation: Secondary | ICD-10-CM | POA: Diagnosis not present

## 2020-06-29 DIAGNOSIS — L081 Erythrasma: Secondary | ICD-10-CM | POA: Diagnosis not present

## 2020-06-29 DIAGNOSIS — H9121 Sudden idiopathic hearing loss, right ear: Secondary | ICD-10-CM | POA: Diagnosis not present

## 2020-06-29 DIAGNOSIS — L570 Actinic keratosis: Secondary | ICD-10-CM | POA: Diagnosis not present

## 2020-06-30 ENCOUNTER — Encounter: Payer: Self-pay | Admitting: Internal Medicine

## 2020-07-01 ENCOUNTER — Other Ambulatory Visit: Payer: Self-pay

## 2020-07-01 ENCOUNTER — Ambulatory Visit (INDEPENDENT_AMBULATORY_CARE_PROVIDER_SITE_OTHER): Payer: Medicare HMO

## 2020-07-01 DIAGNOSIS — Z7901 Long term (current) use of anticoagulants: Secondary | ICD-10-CM

## 2020-07-01 DIAGNOSIS — I359 Nonrheumatic aortic valve disorder, unspecified: Secondary | ICD-10-CM | POA: Diagnosis not present

## 2020-07-01 DIAGNOSIS — Z5181 Encounter for therapeutic drug level monitoring: Secondary | ICD-10-CM | POA: Diagnosis not present

## 2020-07-01 DIAGNOSIS — H9121 Sudden idiopathic hearing loss, right ear: Secondary | ICD-10-CM | POA: Diagnosis not present

## 2020-07-01 DIAGNOSIS — Z952 Presence of prosthetic heart valve: Secondary | ICD-10-CM | POA: Diagnosis not present

## 2020-07-01 LAB — POCT INR: INR: 1.8 — AB (ref 2.0–3.0)

## 2020-07-01 NOTE — Telephone Encounter (Signed)
No clue, please ask him to verify

## 2020-07-01 NOTE — Telephone Encounter (Signed)
Do you know what the patient is asking for?

## 2020-07-01 NOTE — Patient Instructions (Signed)
Take 1 tablet tonight only and then Continue with 1 tablet each Monday, Wednesday and Friday, 1/2 tablet all other days.  Repeat INR in 6 weeks. [phone]

## 2020-07-02 NOTE — Telephone Encounter (Signed)
I'm confuse, does he mean Dexcom?

## 2020-07-02 NOTE — Telephone Encounter (Signed)
I called the patient spoke to him over the phone he was confused about the NovoLog apparently he has not started taking it yet, he was under the impression that the Dexcom is to relate to the NovoLog.  But I did explain to him that the NovoLog 10 units is to be taken with each meal.   Somehow he tried to contact the companies are not sure which 1 about his Dexcom, he has not received it yet.  Is there any update on the Dexcom?  Thank you

## 2020-07-05 DIAGNOSIS — H00025 Hordeolum internum left lower eyelid: Secondary | ICD-10-CM | POA: Diagnosis not present

## 2020-07-13 DIAGNOSIS — G4733 Obstructive sleep apnea (adult) (pediatric): Secondary | ICD-10-CM | POA: Diagnosis not present

## 2020-07-28 DIAGNOSIS — N1832 Chronic kidney disease, stage 3b: Secondary | ICD-10-CM | POA: Diagnosis not present

## 2020-07-28 DIAGNOSIS — I129 Hypertensive chronic kidney disease with stage 1 through stage 4 chronic kidney disease, or unspecified chronic kidney disease: Secondary | ICD-10-CM | POA: Diagnosis not present

## 2020-07-28 DIAGNOSIS — I2510 Atherosclerotic heart disease of native coronary artery without angina pectoris: Secondary | ICD-10-CM | POA: Diagnosis not present

## 2020-07-28 DIAGNOSIS — E1122 Type 2 diabetes mellitus with diabetic chronic kidney disease: Secondary | ICD-10-CM | POA: Diagnosis not present

## 2020-07-28 DIAGNOSIS — Z952 Presence of prosthetic heart valve: Secondary | ICD-10-CM | POA: Diagnosis not present

## 2020-07-28 DIAGNOSIS — E785 Hyperlipidemia, unspecified: Secondary | ICD-10-CM | POA: Diagnosis not present

## 2020-08-03 ENCOUNTER — Telehealth: Payer: Self-pay | Admitting: Family

## 2020-08-03 DIAGNOSIS — G4733 Obstructive sleep apnea (adult) (pediatric): Secondary | ICD-10-CM

## 2020-08-03 NOTE — Telephone Encounter (Signed)
Please request download (CPAP) from [employer] [facility] and also let pt know that I am going to refer him to pulmonology for ongoing management of his cpap.

## 2020-08-03 NOTE — Telephone Encounter (Signed)
Called [employer] [facility] for report but had to dc the call after waiting for 25 minutes. Will try at another time.

## 2020-08-04 ENCOUNTER — Other Ambulatory Visit: Payer: Self-pay | Admitting: Family

## 2020-08-05 NOTE — Telephone Encounter (Signed)
Per [employer] [facility] patient has had the device since 2014 and last data register is from 08/24/2014. They said he is eligible for a new machine.

## 2020-08-06 ENCOUNTER — Other Ambulatory Visit: Payer: Self-pay

## 2020-08-06 DIAGNOSIS — G4733 Obstructive sleep apnea (adult) (pediatric): Secondary | ICD-10-CM

## 2020-08-06 NOTE — Telephone Encounter (Signed)
Order sent to [employer] [facility] for a new CPAP machine

## 2020-08-09 ENCOUNTER — Telehealth: Payer: Self-pay | Admitting: *Deleted

## 2020-08-09 NOTE — Telephone Encounter (Signed)
New, Remus Blake, Lenna Sciara; Jiles Prows, [place]; Reola Calkins Danton Sewer; Stephannie Peters Hello,   I received the order for patients pap unit. To process this order we are in need of the following items:   1. Need order updated to show settings and type of unit needed  2. Notes showing usage and benefits from CPAP.  3. Notes showing / discussing why we are replacing the old CPAP unit.   The order is on hold awaiting these needed items. Once these are received we will review and continue to process at that time.   Thank you,   Demetrius Charity

## 2020-08-10 ENCOUNTER — Telehealth: Payer: Self-pay | Admitting: Family

## 2020-08-10 NOTE — Telephone Encounter (Signed)
Please call [facility] and request a download report from his CPAP. Also, please contact pt to schedule a follow up visit for CPAP/OSA.

## 2020-08-11 NOTE — Telephone Encounter (Signed)
Appointment made for 08/01

## 2020-08-12 DIAGNOSIS — G4733 Obstructive sleep apnea (adult) (pediatric): Secondary | ICD-10-CM | POA: Diagnosis not present

## 2020-08-12 NOTE — Telephone Encounter (Signed)
New, Silverio Decamp, CMA; Darlina Guys; [place], [place]; Samples, Jeanmarie Hubert; 1 other Are you asking for download info from the old unit? If so it looks like we have attampted to contact the patient recently but have not been able to reach them to get info needed for download.  The new order has not been set up yet. We have requested updated info before we are able to set up this new request.  Message was sent on 08-09-20 @ 119pm

## 2020-08-13 ENCOUNTER — Ambulatory Visit (INDEPENDENT_AMBULATORY_CARE_PROVIDER_SITE_OTHER): Payer: Medicare HMO

## 2020-08-13 ENCOUNTER — Other Ambulatory Visit: Payer: Self-pay

## 2020-08-13 DIAGNOSIS — Z5181 Encounter for therapeutic drug level monitoring: Secondary | ICD-10-CM | POA: Diagnosis not present

## 2020-08-13 DIAGNOSIS — I359 Nonrheumatic aortic valve disorder, unspecified: Secondary | ICD-10-CM | POA: Diagnosis not present

## 2020-08-13 DIAGNOSIS — Z952 Presence of prosthetic heart valve: Secondary | ICD-10-CM | POA: Diagnosis not present

## 2020-08-13 DIAGNOSIS — Z7901 Long term (current) use of anticoagulants: Secondary | ICD-10-CM

## 2020-08-13 LAB — POCT INR: INR: 2.7 (ref 2.0–3.0)

## 2020-08-13 NOTE — Patient Instructions (Signed)
Continue with 1 tablet each Monday, Wednesday and Friday, 1/2 tablet all other days.  Repeat INR in 6 weeks. [phone]

## 2020-08-17 ENCOUNTER — Telehealth: Payer: Self-pay | Admitting: Internal Medicine

## 2020-08-17 ENCOUNTER — Encounter: Payer: Self-pay | Admitting: Internal Medicine

## 2020-08-17 ENCOUNTER — Ambulatory Visit (INDEPENDENT_AMBULATORY_CARE_PROVIDER_SITE_OTHER): Payer: Medicare HMO | Admitting: Internal Medicine

## 2020-08-17 ENCOUNTER — Other Ambulatory Visit: Payer: Self-pay

## 2020-08-17 VITALS — BP 158/62 | HR 64 | Ht 69.0 in | Wt 218.0 lb

## 2020-08-17 DIAGNOSIS — E1165 Type 2 diabetes mellitus with hyperglycemia: Secondary | ICD-10-CM

## 2020-08-17 DIAGNOSIS — E118 Type 2 diabetes mellitus with unspecified complications: Secondary | ICD-10-CM

## 2020-08-17 DIAGNOSIS — Z794 Long term (current) use of insulin: Secondary | ICD-10-CM | POA: Diagnosis not present

## 2020-08-17 DIAGNOSIS — E1122 Type 2 diabetes mellitus with diabetic chronic kidney disease: Secondary | ICD-10-CM | POA: Diagnosis not present

## 2020-08-17 DIAGNOSIS — E1142 Type 2 diabetes mellitus with diabetic polyneuropathy: Secondary | ICD-10-CM

## 2020-08-17 DIAGNOSIS — N183 Chronic kidney disease, stage 3 unspecified: Secondary | ICD-10-CM | POA: Diagnosis not present

## 2020-08-17 DIAGNOSIS — E1159 Type 2 diabetes mellitus with other circulatory complications: Secondary | ICD-10-CM

## 2020-08-17 DIAGNOSIS — IMO0002 Reserved for concepts with insufficient information to code with codable children: Secondary | ICD-10-CM

## 2020-08-17 LAB — POCT GLYCOSYLATED HEMOGLOBIN (HGB A1C): Hemoglobin A1C: 9.1 % — AB (ref 4.0–5.6)

## 2020-08-17 MED ORDER — DAPAGLIFLOZIN PROPANEDIOL 5 MG PO TABS: 5.0000 mg | ORAL_TABLET | Freq: Every day | ORAL | 1 refills | Status: DC

## 2020-08-17 MED ORDER — NOVOLOG FLEXPEN 100 UNIT/ML ~~LOC~~ SOPN: PEN_INJECTOR | SUBCUTANEOUS | 3 refills | Status: DC

## 2020-08-17 NOTE — Patient Instructions (Addendum)
-   STOP Trulicity  - Start Farxiga 5 mg , 1 tablet every morning  - Continue Basaglar 38 units daily  - Change Novolog to 10 units with Breakfast, 10 units with Lunch and 14 units with Supper   Labs in 3 weeks    HOW TO TREAT LOW BLOOD SUGARS (Blood sugar LESS THAN 70 MG/DL) Please follow the RULE OF 15 for the treatment of hypoglycemia treatment (when your (blood sugars are less than 70 mg/dL)   STEP 1: Take 15 grams of carbohydrates when your blood sugar is low, which includes:  3-4 GLUCOSE TABS  OR 3-4 OZ OF JUICE OR REGULAR SODA OR ONE TUBE OF GLUCOSE GEL    STEP 2: RECHECK blood sugar in 15 MINUTES STEP 3: If your blood sugar is still low at the 15 minute recheck --> then, go back to STEP 1 and treat AGAIN with another 15 grams of carbohydrates.

## 2020-08-17 NOTE — Telephone Encounter (Signed)
Can you please call [facility] and check on the status of his Dexcom ?   He is hard of hearing I don;t think his conversations with him were successful    Thanks  Abby Nena Jordan, MD  [facility] Endocrinology  [facility] [address] [place], [place] [address] Phone: [phone] FAX: [phone]

## 2020-08-17 NOTE — Progress Notes (Signed)
Name: [name]  Age/ Sex: 75 y.o., male   MRN/ DOB: [id number], [birth year]     PCP: [name], NP   Reason for Endocrinology Evaluation: Type 2 Diabetes Mellitus  Initial Endocrine Consultative Visit: 11/11/2018    PATIENT IDENTIFIER: [name] is a 75 y.o. male with a past medical history of T2DM, OSA, HTN, CAD ( S/P CABG) . The patient has followed with Endocrinology clinic since 11/11/2018 for consultative assistance with management of his diabetes.  DIABETIC HISTORY:  [name] was diagnosed with T2DM in 2005. He was on metformin which was stopped due to renal dysfunction . Has been on insulin since 2015. His hemoglobin A1c has ranged from 6.3% in 2015, peaking at 8.3% in 2020.  On his initial visit to our clinic he was on basal insulin only. With an A1c of 8.3. Trulicity was added.    Novolog started 04/2020 due to an A1c 9.6%   SUBJECTIVE:   During the last visit (05/18/2020): A1c of 9.6 %.  We Continued Trulicity and basal insulin and started novolog      Today (08/17/2020): [name] is here for a  follow up on diabetes management. He is accompanied by his wife today . He checks his blood sugars 2 times daily, preprandial to breakfast and bedtime. The patient has not had hypoglycemic episodes since the last clinic visit.  Denies nausea, vomiting nor diarrhea   He was seen by Nephrology and was recommended to start SGLT-2 inhibitors but this is cost prohibitive, they suggested switching Trulicity to SGLT-2 inhibitors  which is a fair recommendation  Admits to dietary indiscretions when grandson was home, snacking on pretzels, frosties, ramen etc   HOME DIABETES REGIMEN:  Basaglar 38 units daily  Trulicity 3 mg once weekly  Novolog 10 units TID QAC     METER DOWNLOAD SUMMARY: 7/5-7/19/2022  Average Number Tests/Day = 1.8 Overall Mean FS Glucose = 221 Standard Deviation = 69  BG Ranges: Low = 137 High = 347  Hypoglycemic  Events/30 Days: BG < 50 = 0 Episodes of symptomatic severe hypoglycemia = 0       DIABETIC COMPLICATIONS: Microvascular complications:  CKDIII- Dr. Hollie Salk  Denies: neuropathy, retinopathy  Last eye exam: Completed 08/2019    Macrovascular complications:  CAD (S/P CABG )  Denies: PVD, CVA    HISTORY:  Past Medical History:  Past Medical History:  Diagnosis Date   Allergy    Aortic stenosis    CAD (coronary artery disease)    cabg   Cancer ([place]) 2011   prostate   Erectile dysfunction    Heart murmur    Hemochromatosis 02/14/2011   Hemorrhoids    History of gout    History of hepatitis 1974   Hyperlipidemia    Hypertension    Hypertr obst cardiomyop    Obesity    moderate   Sensorineural hearing loss    Type II or unspecified type diabetes mellitus without mention of complication, not stated as uncontrolled    Past Surgical History:  Past Surgical History:  Procedure Laterality Date   AORTIC VALVE REPLACEMENT  11/14/2003   [facility]   APPENDECTOMY  1990   CHOLECYSTECTOMY  1990   CORONARY ARTERY BYPASS GRAFT  10/2003   [place]   right, inguinal   HERNIA REPAIR  2002   left, inguinal   HIP SURGERY  2006   right hip   PILONIDAL CYST EXCISION  1964   prostate seed implant  3/12   TONSILLECTOMY  childhood   Social History:  reports that he has been smoking cigars. He has never used smokeless tobacco. He reports current alcohol use. He reports that he does not use drugs. Family History:  Family History  Problem Relation Age of Onset   Heart disease Mother    Stroke Mother    Heart disease Father    Asperger's syndrome Son    Hyperlipidemia Son    Coronary artery disease Brother    Cancer Neg Hx        negative for colon cancer     HOME MEDICATIONS: Allergies as of 08/17/2020   No Known Allergies      Medication List        Accurate as of August 17, 2020 11:04 AM. If you have any questions, ask your nurse or doctor.           STOP taking these medications    gabapentin 100 MG capsule Commonly known as: NEURONTIN Stopped by: Dorita Sciara, MD       TAKE these medications    AMBULATORY NON FORMULARY MEDICATION cpap cushions            AirFit F20 (Size: Large)           AirFit F30 (Size: Med)   Please FAX the prescription to:  [phone]   aspirin EC 81 MG tablet Take 1 tablet (81 mg total) by mouth daily.   atorvastatin 40 MG tablet Commonly known as: LIPITOR Take 1 tablet (40 mg total) by mouth daily.   Basaglar KwikPen 100 UNIT/ML Inject 38 Units into the skin daily.   betamethasone dipropionate 0.05 % cream Apply topically 2 (two) times daily as needed. To eczema rash   Blood Glucose Monitoring Suppl Supplies Misc Use for monitoring glucose level   colchicine 0.6 MG tablet TAKE 2 TABS BY MOUTH NOW AND THEN 1 TAB IN 1 HOUR FOR GOUT. (MAX 3 TABS/24 HRS)   Dexcom G6 Sensor Misc 1 Device by Does not apply route as directed.   Dexcom G6 Transmitter Misc 1 Device by Does not apply route as directed.   fenofibrate 145 MG tablet Commonly known as: TRICOR TAKE 1 TABLET BY MOUTH EVERY DAY   furosemide 20 MG tablet Commonly known as: LASIX TAKE 1 TABLET BY MOUTH EVERY DAY AS NEEDED FOR SWELLING   imiquimod 5 % cream Commonly known as: ALDARA Apply topically 3 (three) times a week.   Insulin Pen Needle 32G X 4 MM Misc 1 Device by Does not apply route in the morning, at noon, in the evening, and at bedtime.   metoprolol succinate 25 MG 24 hr tablet Commonly known as: TOPROL-XL TAKE 1 TABLET BY MOUTH EVERY DAY   NovoLOG FlexPen 100 UNIT/ML FlexPen Generic drug: insulin aspart Inject 10 Units into the skin 3 (three) times daily with meals.   OneTouch Delica Lancets 38G Misc USE AS DIRECTED   OneTouch Verio test strip Generic drug: glucose blood USE AS INSTRUCTED TO CHECK BLOOD SUGAR TWICE A DAY. DX E11.8   Suprep Bowel Prep Kit 17.5-3.13-1.6 GM/177ML Soln Generic  drug: Na Sulfate-K Sulfate-Mg Sulf Take 1 kit by mouth as directed.   Trulicity 3 TX/6.4WO Sopn Generic drug: Dulaglutide Inject 3 mg as directed once a week.   warfarin 7.5 MG tablet Commonly known as: COUMADIN Take as directed by the anticoagulation clinic. If you are unsure how to take this medication, talk to  your nurse or doctor. Original instructions: Take 1-2 tablets Daily or as prescribed by Coumadin Clinic         OBJECTIVE:   Vital Signs: BP (!) 158/62   Pulse 64   Ht 5' 9"  (1.753 m)   Wt 218 lb (98.9 kg)   SpO2 98%   BMI 32.19 kg/m   Wt Readings from Last 3 Encounters:  08/17/20 218 lb (98.9 kg)  06/02/20 207 lb (93.9 kg)  05/18/20 211 lb 4 oz (95.8 kg)     Exam: General: Pt appears well and is in NAD  Lungs: Clear with good BS bilat with no rales, rhonchi, or wheezes  Heart: RRR with normal S1 and S2 and no gallops; no murmurs; no rub  Extremities: No pretibial edema.   Neuro: MS is good with appropriate affect, pt is alert and Ox3         DATA REVIEWED:  Lab Results  Component Value Date   HGBA1C 9.1 (A) 08/17/2020   HGBA1C 9.6 (A) 05/18/2020   HGBA1C 8.8 (A) 02/03/2020   Lab Results  Component Value Date   MICROALBUR <0.7 06/02/2020   LDLCALC 99 09/22/2019   CREATININE 1.39 06/02/2020   Lab Results  Component Value Date   MICRALBCREAT 3.4 06/02/2020     Lab Results  Component Value Date   CHOL 165 06/02/2020   HDL 24.50 (L) 06/02/2020   LDLCALC 99 09/22/2019   LDLDIRECT 93.0 06/02/2020   TRIG (H) 06/02/2020    484.0 Triglyceride is over 400; calculations on Lipids are invalid.   CHOLHDL 7 06/02/2020         ASSESSMENT / PLAN / RECOMMENDATIONS:   1) Type 2 Diabetes Mellitus, Poorly controlled, With CKD III , Neuropathic and Macrovascular complications - Most recent A1c of 9.1 %. Goal A1c < 7.5%.   -His A1c is slightly trending down, despite dietary indiscretions, but he is going to limit his carbohydrate intake -He is  tolerating multiple daily injections of insulin, he has not been able to obtain the Dexcom, we will check with the pharmacy on this -He tends to forget to take prandial insulin, we discussed ways of how he can adjust his schedule to remember this better -I agree with nephrology and switching Trulicity to SGLT2 inhibitor, and since the cost has been an issue, he was provided with patient assistance program papers today, cautioned against genital infections with this and encouraged hydration -We will also adjust prandial insulin as below -I have encouraged him to check glucose more often with each meal -We will schedule him for repeat BMP in 3 weeks, if BMP stable will increase [place]    MEDICATIONS: - STOP Trulicity  - Start Farxiga 5 mg , 1 tablet every morning  - Continue Basaglar 38 units daily  - Change Novolog to 10 units with Breakfast, 10 units with Lunch and 14 units with Supper   EDUCATION / INSTRUCTIONS: BG monitoring instructions: Patient is instructed to check his blood sugars 3 times a day, before each meal. Call [facility] if: BG persistently < 70 I reviewed the Rule of 15 for the treatment of hypoglycemia in detail with the patient. Literature supplied.   2) Diabetic complications:  Eye: Does not have known diabetic retinopathy. Neuro/ Feet: Does have known diabetic peripheral neuropathy. Renal: Patient does have known baseline CKD. He is on an ACEI/ARB at present. He sees Dr. Hollie Salk    F/U in 4 months    Signed electronically by: Elenora Gamma  Kelton Pillar, MD  [facility]  [facility] [address] [place], [place] [address] Phone: [phone] FAX: [phone]   CC: [name], NP [address] [place] [address] Phone: [phone]  Fax: [phone]  Return to Endocrinology clinic as below: Future Appointments  Date Time Provider [place]  08/30/2020 12:00 PM  [name], NP [facility]  09/24/2020 11:15 AM [facility]  09/27/2020 10:40 AM Stanford Breed, Denice Bors, MD [facility] None  12/07/2020 10:20 AM [name], NP [facility]

## 2020-08-18 NOTE — Telephone Encounter (Signed)
Sent refilled pen needles--which pt requested to the pharmacy.

## 2020-08-24 DIAGNOSIS — H903 Sensorineural hearing loss, bilateral: Secondary | ICD-10-CM | POA: Diagnosis not present

## 2020-08-24 DIAGNOSIS — R69 Illness, unspecified: Secondary | ICD-10-CM | POA: Diagnosis not present

## 2020-08-26 ENCOUNTER — Telehealth: Payer: Self-pay

## 2020-08-26 NOTE — Telephone Encounter (Signed)
[place] for patient assistants good for 08/20/20-01/29/21 . Copy has scan in pt chart , pt has been notified. [id number]

## 2020-08-30 ENCOUNTER — Other Ambulatory Visit: Payer: Self-pay

## 2020-08-30 ENCOUNTER — Ambulatory Visit (INDEPENDENT_AMBULATORY_CARE_PROVIDER_SITE_OTHER): Payer: Medicare HMO | Admitting: Family

## 2020-08-30 VITALS — BP 129/66 | HR 63 | Temp 97.7°F | Resp 16 | Wt 217.0 lb

## 2020-08-30 DIAGNOSIS — I10 Essential (primary) hypertension: Secondary | ICD-10-CM | POA: Diagnosis not present

## 2020-08-30 DIAGNOSIS — E118 Type 2 diabetes mellitus with unspecified complications: Secondary | ICD-10-CM

## 2020-08-30 DIAGNOSIS — E1165 Type 2 diabetes mellitus with hyperglycemia: Secondary | ICD-10-CM | POA: Diagnosis not present

## 2020-08-30 DIAGNOSIS — E785 Hyperlipidemia, unspecified: Secondary | ICD-10-CM

## 2020-08-30 DIAGNOSIS — IMO0002 Reserved for concepts with insufficient information to code with codable children: Secondary | ICD-10-CM

## 2020-08-30 DIAGNOSIS — H905 Unspecified sensorineural hearing loss: Secondary | ICD-10-CM | POA: Diagnosis not present

## 2020-08-30 NOTE — Assessment & Plan Note (Signed)
BP Readings from Last 3 Encounters:  08/30/20 129/66  08/17/20 (!) 158/62  06/02/20 135/67   BP stable. Continue toprol xl '25mg'$ .

## 2020-08-30 NOTE — Progress Notes (Signed)
Subjective:   By signing my name below, I, [name], attest that this documentation has been prepared under the direction and in the presence of [name] NP. 08/30/2020    Patient ID: [name], male    DOB: 1945/02/13, 75 y.o.   MRN: [id number]  Chief Complaint  Patient presents with   Hypertension    Here for follow up    Hyperlipidemia    Here for follow up    HPI Patient is in today for a office visit.   Rash- He has seen his dermatologist to manage a rash on his right under arms. He is planning to see them again because his rash continues to stay, but he notes that it is not red any more.  Hearing- He reports that his hearing has not worsened since his last screening.  Colonoscopy- He is due for a colonoscopy. He is planning to schedule a colonoscopy.  Diabetes- He has not received his dexcom from his pharmacy yet. He has stopped taking trulicity and reports no new issues.  Cholesterol- His triglycerides were elevated during his last visit.  Lab Results  Component Value Date   CHOL 165 06/02/2020   HDL 24.50 (L) 06/02/2020   LDLCALC 99 09/22/2019   LDLDIRECT 93.0 06/02/2020   TRIG (H) 06/02/2020    484.0 Triglyceride is over 400; calculations on Lipids are invalid.   CHOLHDL 7 06/02/2020   Blood pressure- His blood pressure is doing good this visit. BP Readings from Last 3 Encounters:  08/30/20 129/66  08/17/20 (!) 158/62  06/02/20 135/67    Health Maintenance Due  Topic Date Due   Zoster Vaccines- Shingrix (1 of 2) Never done   COLONOSCOPY (Pts 45-40yr Insurance coverage will need to be confirmed)  10/06/2019   FOOT EXAM  01/14/2020   COVID-19 Vaccine (4 - Booster for Pfizer series) 02/12/2020    Past Medical History:  Diagnosis Date   Allergy    Aortic stenosis    CAD (coronary artery disease)    cabg   [place] 2011   prostate   Erectile dysfunction    Heart murmur    Hemochromatosis 02/14/2011   Hemorrhoids    History  of gout    History of hepatitis 1974   Hyperlipidemia    Hypertension    Hypertr obst cardiomyop    Obesity    moderate   Sensorineural hearing loss    Type II or unspecified type diabetes mellitus without mention of complication, not stated as uncontrolled     Past Surgical History:  Procedure Laterality Date   AORTIC VALVE REPLACEMENT  11/14/2003   [facility]   APPENDECTOMY  1990   CHOLECYSTECTOMY  1990   CORONARY ARTERY BYPASS GRAFT  10/2003   HERNIA REPAIR  1999   right, inguinal   HERNIA REPAIR  2002   left, inguinal   HIP SURGERY  2006   right hip   PILONIDAL CYST EXCISION  1964   prostate seed implant  3/12   TONSILLECTOMY  childhood    Family History  Problem Relation Age of Onset   Heart disease Mother    Stroke Mother    Heart disease Father    Asperger's syndrome Son    Hyperlipidemia Son    Coronary artery disease Brother    Cancer Neg Hx        negative for colon cancer    Social History   Socioeconomic History   Marital status: Married  Spouse name: Not on file   Number of children: Not on file   Years of education: Not on file   Highest education level: Not on file  Occupational History   Not on file  Tobacco Use   Smoking status: Light Smoker    Types: Cigars   Smokeless tobacco: Never  Substance and Sexual Activity   Alcohol use: Yes    Alcohol/week: 0.0 standard drinks    Comment: 4 glasses wine a month   Drug use: No   Sexual activity: Never  Other Topics Concern   Not on file  Social History Narrative   Not on file   Social Determinants of Health   Financial Resource Strain: Not on file  Food Insecurity: Not on file  Transportation Needs: Not on file  Physical Activity: Not on file  Stress: Not on file  Social Connections: Not on file  Intimate Partner Violence: Not on file    Outpatient Medications Prior to Visit  Medication Sig Dispense Refill   AMBULATORY NON FORMULARY MEDICATION cpap cushions             AirFit F20 (Size: Large)           AirFit F30 (Size: Med)   Please FAX the prescription to:  [phone] 12 each prn   aspirin EC 81 MG tablet Take 1 tablet (81 mg total) by mouth daily. 90 tablet 3   atorvastatin (LIPITOR) 40 MG tablet Take 1 tablet (40 mg total) by mouth daily. 90 tablet 3   betamethasone dipropionate (DIPROLENE) 0.05 % cream Apply topically 2 (two) times daily as needed. To eczema rash 30 g 1   Blood Glucose Monitoring Suppl Supplies MISC Use for monitoring glucose level 100 each 1   colchicine 0.6 MG tablet TAKE 2 TABS BY MOUTH NOW AND THEN 1 TAB IN 1 HOUR FOR GOUT. (MAX 3 TABS/24 HRS) 9 tablet 1   Continuous Blood Gluc Sensor (DEXCOM G6 SENSOR) MISC 1 Device by Does not apply route as directed. 9 each 3   Continuous Blood Gluc Transmit (DEXCOM G6 TRANSMITTER) MISC 1 Device by Does not apply route as directed. 1 each 3   dapagliflozin propanediol (FARXIGA) 5 MG TABS tablet Take 1 tablet (5 mg total) by mouth daily before breakfast. 30 tablet 1   fenofibrate (TRICOR) 145 MG tablet TAKE 1 TABLET BY MOUTH EVERY DAY 90 tablet 0   furosemide (LASIX) 20 MG tablet TAKE 1 TABLET BY MOUTH EVERY DAY AS NEEDED FOR SWELLING 90 tablet 0   imiquimod (ALDARA) 5 % cream Apply topically 3 (three) times a week.     insulin aspart (NOVOLOG FLEXPEN) 100 UNIT/ML FlexPen Max daily 35 units 30 mL 3   Insulin Glargine (BASAGLAR KWIKPEN) 100 UNIT/ML Inject 38 Units into the skin daily. 30 mL 4   Insulin Pen Needle 32G X 4 MM MISC 1 Device by Does not apply route in the morning, at noon, in the evening, and at bedtime. 400 each 3   metoprolol succinate (TOPROL-XL) 25 MG 24 hr tablet TAKE 1 TABLET BY MOUTH EVERY DAY 90 tablet 1   Na Sulfate-K Sulfate-Mg Sulf (SUPREP BOWEL PREP KIT) 17.5-3.13-1.6 GM/177ML SOLN Take 1 kit by mouth as directed. 324 mL 0   OneTouch Delica Lancets 95A MISC USE AS DIRECTED 100 each 1   ONETOUCH VERIO test strip USE AS INSTRUCTED TO CHECK BLOOD SUGAR TWICE A DAY. DX E11.8  100 strip 12   warfarin (COUMADIN) 7.5 MG tablet Take 1-2  tablets Daily or as prescribed by Coumadin Clinic 90 tablet 2   No facility-administered medications prior to visit.    No Known Allergies  Review of Systems  Skin:  Positive for rash (Right under arms).      Objective:    Physical Exam Constitutional:      General: He is not in acute distress.    Appearance: Normal appearance. He is not ill-appearing.  HENT:     Head: Normocephalic and atraumatic.     Right Ear: External ear normal.     Left Ear: External ear normal.  Eyes:     Extraocular Movements: Extraocular movements intact.     Pupils: Pupils are equal, round, and reactive to light.  Cardiovascular:     Rate and Rhythm: Normal rate and regular rhythm.     Heart sounds: Normal heart sounds. No murmur heard.   No gallop.     Comments: Click sound present when listening to heart beat due to artificial valve.  Pulmonary:     Effort: Pulmonary effort is normal. No respiratory distress.     Breath sounds: Normal breath sounds. No wheezing or rales.  Musculoskeletal:     Right lower leg: 2+ Edema present.     Left lower leg: 3+ Edema present.  Skin:    General: Skin is warm and dry.  Neurological:     Mental Status: He is alert and oriented to person, place, and time.  Psychiatric:        Behavior: Behavior normal.    BP 129/66 (BP Location: Left Arm, Patient Position: Sitting, Cuff Size: Small)   Pulse 63   Temp 97.7 F (36.5 C) (Oral)   Resp 16   Wt 217 lb (98.4 kg)   SpO2 97%   BMI 32.05 kg/m  Wt Readings from Last 3 Encounters:  08/30/20 217 lb (98.4 kg)  08/17/20 218 lb (98.9 kg)  06/02/20 207 lb (93.9 kg)       Assessment & Plan:   Problem List Items Addressed This Visit       Unprioritized   Essential hypertension (Chronic)    BP Readings from Last 3 Encounters:  08/30/20 129/66  08/17/20 (!) 158/62  06/02/20 135/67  BP stable. Continue toprol xl 53m.       Dyslipidemia  (Chronic)    He will return for Fasting lipid panel as he has already eaten today.        DM (diabetes mellitus), type 2, uncontrolled with complications (HCC) (Chronic)    This is currently being managed by Endo.  Lab Results  Component Value Date   HGBA1C 9.1 (A) 08/17/2020   HGBA1C 9.6 (A) 05/18/2020   HGBA1C 8.8 (A) 02/03/2020   Lab Results  Component Value Date   MICROALBUR <0.7 06/02/2020   [id number] 09/22/2019   CREATININE 1.39 06/02/2020         Sensorineural hearing loss (SNHL)    He states that he had recent repeat hearing test at ENT and he has regained his hearing back to prior to his most recent event.         Other Visit Diagnoses     Hyperlipidemia, unspecified hyperlipidemia type    -  Primary   Relevant Orders   Lipid panel        No orders of the defined types were placed in this encounter.   I, MDebbrah AlarNP, personally preformed the services described in this documentation.  All medical record entries made by  the scribe were at my direction and in my presence.  I have reviewed the chart and discharge instructions (if applicable) and agree that the record reflects my personal performance and is accurate and complete. 08/30/2020   I,[name],acting as a Education administrator for Nance Pear, NP.,have documented all relevant documentation on the behalf of Nance Pear, NP,as directed by  Nance Pear, NP while in the presence of Nance Pear, NP.   Nance Pear, NP

## 2020-08-30 NOTE — Assessment & Plan Note (Signed)
He will return for Fasting lipid panel as he has already eaten today.

## 2020-08-30 NOTE — Assessment & Plan Note (Addendum)
This is currently being managed by Endo.  Lab Results  Component Value Date   HGBA1C 9.1 (A) 08/17/2020   HGBA1C 9.6 (A) 05/18/2020   HGBA1C 8.8 (A) 02/03/2020   Lab Results  Component Value Date   MICROALBUR <0.7 06/02/2020   [place] 99 09/22/2019   CREATININE 1.39 06/02/2020

## 2020-08-30 NOTE — Patient Instructions (Signed)
Please call your mail order to check the status of the Dexcom prescription [phone]

## 2020-08-30 NOTE — Assessment & Plan Note (Signed)
He states that he had recent repeat hearing test at ENT and he has regained his hearing back to prior to his most recent event.

## 2020-08-31 ENCOUNTER — Encounter: Payer: Self-pay | Admitting: Internal Medicine

## 2020-09-07 ENCOUNTER — Other Ambulatory Visit: Payer: Medicare HMO

## 2020-09-09 ENCOUNTER — Other Ambulatory Visit (INDEPENDENT_AMBULATORY_CARE_PROVIDER_SITE_OTHER): Payer: Medicare HMO

## 2020-09-09 ENCOUNTER — Other Ambulatory Visit: Payer: Self-pay

## 2020-09-09 DIAGNOSIS — E785 Hyperlipidemia, unspecified: Secondary | ICD-10-CM | POA: Diagnosis not present

## 2020-09-09 LAB — LIPID PANEL
Cholesterol: 164 mg/dL (ref 0–200)
HDL: 27.2 mg/dL — ABNORMAL LOW (ref >39.00)
NonHDL: 136.74
Total CHOL/HDL Ratio: 6
Triglycerides: 390 mg/dL — ABNORMAL HIGH (ref 0.0–149.0)
VLDL: 78 mg/dL — ABNORMAL HIGH (ref 0.0–40.0)

## 2020-09-09 LAB — LDL CHOLESTEROL, DIRECT: Direct LDL: 86 mg/dL

## 2020-09-12 DIAGNOSIS — G4733 Obstructive sleep apnea (adult) (pediatric): Secondary | ICD-10-CM | POA: Diagnosis not present

## 2020-09-13 ENCOUNTER — Other Ambulatory Visit: Payer: Self-pay | Admitting: Family

## 2020-09-15 ENCOUNTER — Encounter: Payer: Self-pay | Admitting: Family

## 2020-09-15 ENCOUNTER — Other Ambulatory Visit: Payer: Self-pay | Admitting: Family

## 2020-09-15 MED ORDER — ONETOUCH VERIO VI STRP: ORAL_STRIP | 12 refills | Status: AC

## 2020-09-15 MED ORDER — ONETOUCH DELICA LANCETS 33G MISC: 1 refills | Status: AC

## 2020-09-16 NOTE — Progress Notes (Signed)
HPI: Followup of coronary artery disease. Patient is status post coronary artery bypassing graft (LIMA to the LAD, saphenous vein graft to the marginal, saphenous vein graft sequentially to the right coronary artery and distal circumflex) as well as aortic valve replacement (St. Jude aortic valve) in 2005. He also had a septal myectomy at that time. Carotid Dopplers in May of 2013 showed 0-39% bilateral stenosis.  Abdominal CT April 2015 showed no aneurysm.  Nuclear study May 2016 showed normal perfusion and normal LV function. Echocardiogram November 2021 showed normal LV function, severe asymmetric hypertrophy of the basal septal segment; Status post AVR with mean gradient 12.5 mmHg and no aortic insufficiency; dilated ascending aorta at 43 mm.  Since last seen, patient denies dyspnea, chest pain, palpitations, syncope or bleeding.  Current Outpatient Medications  Medication Sig Dispense Refill   AMBULATORY NON FORMULARY MEDICATION cpap cushions            AirFit F20 (Size: Large)           AirFit F30 (Size: Med)   Please FAX the prescription to:  [phone] 12 each prn   aspirin EC 81 MG tablet Take 1 tablet (81 mg total) by mouth daily. 90 tablet 3   atorvastatin (LIPITOR) 40 MG tablet Take 1 tablet (40 mg total) by mouth daily. 90 tablet 3   betamethasone dipropionate (DIPROLENE) 0.05 % cream Apply topically 2 (two) times daily as needed. To eczema rash 30 g 1   Blood Glucose Monitoring Suppl Supplies MISC Use for monitoring glucose level 100 each 1   colchicine 0.6 MG tablet TAKE 2 TABS BY MOUTH NOW AND THEN 1 TAB IN 1 HOUR FOR GOUT. (MAX 3 TABS/24 HRS) 9 tablet 1   Continuous Blood Gluc Sensor (DEXCOM G6 SENSOR) MISC 1 Device by Does not apply route as directed. 9 each 3   Continuous Blood Gluc Transmit (DEXCOM G6 TRANSMITTER) MISC 1 Device by Does not apply route as directed. 1 each 3   dapagliflozin propanediol (FARXIGA) 5 MG TABS tablet Take 1 tablet (5 mg total) by mouth  daily before breakfast. 30 tablet 1   fenofibrate (TRICOR) 145 MG tablet TAKE 1 TABLET BY MOUTH EVERY DAY 90 tablet 0   furosemide (LASIX) 20 MG tablet TAKE 1 TABLET BY MOUTH EVERY DAY AS NEEDED FOR SWELLING 90 tablet 0   glucose blood (ONETOUCH VERIO) test strip Use as instructed 100 strip 12   imiquimod (ALDARA) 5 % cream Apply topically 3 (three) times a week.     insulin aspart (NOVOLOG FLEXPEN) 100 UNIT/ML FlexPen Max daily 35 units 30 mL 3   Insulin Glargine (BASAGLAR KWIKPEN) 100 UNIT/ML Inject 38 Units into the skin daily. 30 mL 4   Insulin Pen Needle 32G X 4 MM MISC 1 Device by Does not apply route in the morning, at noon, in the evening, and at bedtime. 400 each 3   metoprolol succinate (TOPROL-XL) 25 MG 24 hr tablet TAKE 1 TABLET BY MOUTH EVERY DAY 90 tablet 1   Na Sulfate-K Sulfate-Mg Sulf (SUPREP BOWEL PREP KIT) 17.5-3.13-1.6 GM/177ML SOLN Take 1 kit by mouth as directed. 324 mL 0   OneTouch Delica Lancets 14E MISC USE AS DIRECTED 100 each 1   warfarin (COUMADIN) 7.5 MG tablet TAKE 1-2 TABLETS DAILY OR AS PRESCRIBED BY COUMADIN CLINIC 180 tablet 0   No current facility-administered medications for this visit.     Past Medical History:  Diagnosis Date   Allergy  Aortic stenosis    CAD (coronary artery disease)    cabg   Cancer ([place]) 2011   prostate   Erectile dysfunction    Heart murmur    Hemochromatosis 02/14/2011   Hemorrhoids    History of gout    History of hepatitis 1974   Hyperlipidemia    Hypertension    Hypertr obst cardiomyop    Obesity    moderate   Sensorineural hearing loss    Type II or unspecified type diabetes mellitus without mention of complication, not stated as uncontrolled     Past Surgical History:  Procedure Laterality Date   AORTIC VALVE REPLACEMENT  11/14/2003   [facility]   APPENDECTOMY  1990   CHOLECYSTECTOMY  1990   CORONARY ARTERY BYPASS GRAFT  10/2003   HERNIA REPAIR  1999   right, inguinal   HERNIA REPAIR  2002   left,  inguinal   HIP SURGERY  2006   right hip   PILONIDAL CYST EXCISION  1964   prostate seed implant  3/12   TONSILLECTOMY  childhood    Social History   Socioeconomic History   Marital status: Married    Spouse name: Not on file   Number of children: Not on file   Years of education: Not on file   Highest education level: Not on file  Occupational History   Not on file  Tobacco Use   Smoking status: Light Smoker    Types: Cigars   Smokeless tobacco: Never  Substance and Sexual Activity   Alcohol use: Yes    Alcohol/week: 0.0 standard drinks    Comment: 4 glasses wine a month   Drug use: No   Sexual activity: Never  Other Topics Concern   Not on file  Social History Narrative   Not on file   Social Determinants of Health   Financial Resource Strain: Not on file  Food Insecurity: Not on file  Transportation Needs: Not on file  Physical Activity: Not on file  Stress: Not on file  Social Connections: Not on file  Intimate Partner Violence: Not on file    Family History  Problem Relation Age of Onset   Heart disease Mother    Stroke Mother    Heart disease Father    Asperger's syndrome Son    Hyperlipidemia Son    Coronary artery disease Brother    Cancer Neg Hx        negative for colon cancer    ROS: no fevers or chills, productive cough, hemoptysis, dysphasia, odynophagia, melena, hematochezia, dysuria, hematuria, rash, seizure activity, orthopnea, PND, pedal edema, claudication. Remaining systems are negative.  Physical Exam: Well-developed well-nourished in no acute distress.  Skin is warm and dry.  HEENT is normal.  Neck is supple.  Chest is clear to auscultation with normal expansion.  Cardiovascular exam is regular rate and rhythm.  Crisp mechanical valve sound.  No diastolic murmur noted. Abdominal exam nontender or distended. No masses palpated. Extremities show no edema. neuro grossly intact  ECG-sinus bradycardia at a rate of 58, normal axis,  lateral T wave inversion.  Personally reviewed  A/P  1 status post aortic valve replacement-we will continue Coumadin.  Discontinue aspirin.  Continue SBE prophylaxis.  2 coronary artery disease-patient without chest pain.  Continue statin.  3 hypertension-blood pressure controlled.  Continue present medications and follow.  4 hyperlipidemia-continue statin.  Recent LDL not at goal.  Increase Lipitor to 80 mg daily.  Check lipids and liver  in 12 weeks.  5 history of dilated ascending aorta-I have avoided CTA or MRA given baseline renal insufficiency.  We will plan to repeat echocardiogram November 2022.  6 history of hypertrophic cardiomyopathy-status post myectomy.  Continue beta-blocker.  Kirk Ruths, MD

## 2020-09-18 ENCOUNTER — Encounter: Payer: Self-pay | Admitting: Family

## 2020-09-18 DIAGNOSIS — E1165 Type 2 diabetes mellitus with hyperglycemia: Secondary | ICD-10-CM

## 2020-09-18 DIAGNOSIS — IMO0002 Reserved for concepts with insufficient information to code with codable children: Secondary | ICD-10-CM

## 2020-09-19 ENCOUNTER — Other Ambulatory Visit: Payer: Self-pay | Admitting: Family

## 2020-09-19 ENCOUNTER — Other Ambulatory Visit: Payer: Self-pay | Admitting: Cardiology

## 2020-09-19 DIAGNOSIS — I152 Hypertension secondary to endocrine disorders: Secondary | ICD-10-CM

## 2020-09-19 DIAGNOSIS — E1159 Type 2 diabetes mellitus with other circulatory complications: Secondary | ICD-10-CM

## 2020-09-21 MED ORDER — BASAGLAR KWIKPEN 100 UNIT/ML ~~LOC~~ SOPN: 38.0000 [IU] | PEN_INJECTOR | Freq: Every day | SUBCUTANEOUS | 4 refills | Status: DC

## 2020-09-22 DIAGNOSIS — E119 Type 2 diabetes mellitus without complications: Secondary | ICD-10-CM | POA: Diagnosis not present

## 2020-09-24 ENCOUNTER — Ambulatory Visit (INDEPENDENT_AMBULATORY_CARE_PROVIDER_SITE_OTHER): Payer: Medicare HMO

## 2020-09-24 ENCOUNTER — Other Ambulatory Visit: Payer: Self-pay

## 2020-09-24 ENCOUNTER — Encounter: Payer: Self-pay | Admitting: Internal Medicine

## 2020-09-24 DIAGNOSIS — Z5181 Encounter for therapeutic drug level monitoring: Secondary | ICD-10-CM | POA: Diagnosis not present

## 2020-09-24 DIAGNOSIS — I359 Nonrheumatic aortic valve disorder, unspecified: Secondary | ICD-10-CM

## 2020-09-24 DIAGNOSIS — Z952 Presence of prosthetic heart valve: Secondary | ICD-10-CM

## 2020-09-24 LAB — POCT INR: INR: 2.4 (ref 2.0–3.0)

## 2020-09-24 NOTE — Patient Instructions (Signed)
Description   Continue with 1 tablet each Monday, Wednesday and Friday, 1/2 tablet all other days.  Repeat INR in 6 weeks. [phone]

## 2020-09-27 ENCOUNTER — Ambulatory Visit: Payer: Medicare HMO | Admitting: Cardiology

## 2020-09-27 ENCOUNTER — Encounter: Payer: Self-pay | Admitting: Cardiology

## 2020-09-27 ENCOUNTER — Other Ambulatory Visit: Payer: Self-pay

## 2020-09-27 VITALS — BP 116/58 | HR 58 | Ht 69.5 in | Wt 219.1 lb

## 2020-09-27 DIAGNOSIS — I359 Nonrheumatic aortic valve disorder, unspecified: Secondary | ICD-10-CM | POA: Diagnosis not present

## 2020-09-27 DIAGNOSIS — Z952 Presence of prosthetic heart valve: Secondary | ICD-10-CM | POA: Diagnosis not present

## 2020-09-27 DIAGNOSIS — I10 Essential (primary) hypertension: Secondary | ICD-10-CM

## 2020-09-27 DIAGNOSIS — I2510 Atherosclerotic heart disease of native coronary artery without angina pectoris: Secondary | ICD-10-CM

## 2020-09-27 DIAGNOSIS — E785 Hyperlipidemia, unspecified: Secondary | ICD-10-CM | POA: Diagnosis not present

## 2020-09-27 MED ORDER — ATORVASTATIN CALCIUM 80 MG PO TABS: 80.0000 mg | ORAL_TABLET | Freq: Every day | ORAL | 3 refills | Status: DC

## 2020-09-27 NOTE — Addendum Note (Signed)
Addended by: Cristopher Estimable on: 09/27/2020 11:13 AM   Modules accepted: Orders

## 2020-09-27 NOTE — Patient Instructions (Signed)
Medication Instructions:   STOP ASPIRIN  INCREASE ATORVASTATIN TO 80 MG ONCE DAILY= 2 OF THE 40 MG TABLETS ONCE DAILY  *If you need a refill on your cardiac medications before your next appointment, please call your pharmacy*   Lab Work:  Your physician recommends that you return for lab work in: [place]  If you have labs (blood work) drawn today and your tests are completely normal, you will receive your results only by: [place] (if you have MyChart) OR A paper copy in the mail If you have any lab test that is abnormal or we need to change your treatment, we will call you to review the results.   Testing/Procedures:  Your physician has requested that you have an echocardiogram. Echocardiography is a painless test that uses sound waves to create images of your heart. It provides your doctor with information about the size and shape of your heart and how well your heart's chambers and valves are working. This procedure takes approximately one hour. There are no restrictions for this procedure. [facility] [facility]-1 ST FLOOR IMAGING   Follow-Up: At [facility], you and your health needs are our priority.  As part of our continuing mission to provide you with exceptional heart care, we have created designated Provider Care Teams.  These Care Teams include your primary Cardiologist (physician) and Advanced Practice Providers (APPs -  Physician Assistants and Nurse Practitioners) who all work together to provide you with the care you need, when you need it.  We recommend signing up for the patient portal called "MyChart".  Sign up information is provided on this After Visit Summary.  MyChart is used to connect with patients for Virtual Visits (Telemedicine).  Patients are able to view lab/test results, encounter notes, upcoming appointments, etc.  Non-urgent messages can be sent to your provider as well.   To learn more about what you can do with MyChart, go to  [email or web address].    Your next appointment:   6 month(s)  The format for your next appointment:   In Person  Provider:   Kirk Ruths, MD

## 2020-09-29 ENCOUNTER — Telehealth: Payer: Self-pay | Admitting: Nutrition

## 2020-09-30 ENCOUNTER — Encounter: Payer: Self-pay | Admitting: Internal Medicine

## 2020-10-05 ENCOUNTER — Encounter: Payer: Medicare HMO | Attending: Internal Medicine | Admitting: Nutrition

## 2020-10-05 ENCOUNTER — Other Ambulatory Visit: Payer: Self-pay

## 2020-10-05 DIAGNOSIS — E1165 Type 2 diabetes mellitus with hyperglycemia: Secondary | ICD-10-CM | POA: Diagnosis not present

## 2020-10-05 DIAGNOSIS — E118 Type 2 diabetes mellitus with unspecified complications: Secondary | ICD-10-CM | POA: Diagnosis not present

## 2020-10-05 DIAGNOSIS — IMO0002 Reserved for concepts with insufficient information to code with codable children: Secondary | ICD-10-CM

## 2020-10-05 NOTE — Patient Instructions (Addendum)
Change sensor every 10 days Change transmitter every 3 months Have some protein at each meal Limit carb choices to no more than 2 at breakfast, 2-3 at lunch, 2 at supper and 1 at bedtime. Limit fried foods or any high fat item like biscuits to 1-2X a week Watch amounts of butter, salad dressing and sour cream, cream cheese, and oil used in cooking, at each meal.

## 2020-10-05 NOTE — Progress Notes (Signed)
Patient is here with his wife to learn how to use the Dexcom sensor.  He wants to use the receiver.  We discussed the difference between sensor readings and blood sugar readings, and they reported good understanding of this.  He was shown how to set up his reader putting in the transmitter and code numbers, and how to insert the sensor and transmitter.   They thought they were to see the dietitian after the appointment with me. No other referral in chart. We discussed the 3 major food groups and the need for all three groups of food at each meal--but to limit the amounts of each food group at each meal.   We discussed what foods are in each major food group and how much of each group he should be eating at each meal.  He reported good understanding of this.  and had no final questions. Patient says he knows what to do, but is just not doing it. Discussed factors that keep him for limiting food intake at each meal, and what is needed to correct this.  Written instructions given for meal size, goals for blood sugar readings after meals and before, and exercise options to reduce insulin resistance. They had no final questions

## 2020-10-06 ENCOUNTER — Encounter: Payer: Self-pay | Admitting: Internal Medicine

## 2020-10-11 ENCOUNTER — Ambulatory Visit (HOSPITAL_BASED_OUTPATIENT_CLINIC_OR_DEPARTMENT_OTHER)
Admission: RE | Admit: 2020-10-11 | Discharge: 2020-10-11 | Disposition: A | Discharge status: Home / Self Care | Payer: Medicare HMO | Source: Ambulatory Visit | Attending: Cardiology | Admitting: Cardiology

## 2020-10-11 ENCOUNTER — Other Ambulatory Visit: Payer: Self-pay

## 2020-10-11 DIAGNOSIS — I359 Nonrheumatic aortic valve disorder, unspecified: Secondary | ICD-10-CM | POA: Diagnosis not present

## 2020-10-11 MED ORDER — PERFLUTREN LIPID MICROSPHERE
1.0000 mL | INTRAVENOUS | Status: AC | PRN
  Administered 2020-10-11: 2 mL via INTRAVENOUS

## 2020-10-12 LAB — ECHOCARDIOGRAM COMPLETE
AR max vel: 2.24 cm2
AV Area VTI: 2.5 cm2
AV Area mean vel: 2.18 cm2
AV Mean grad: 9.8 mmHg
AV Peak grad: 15.9 mmHg
Ao pk vel: 2 m/s
Area-P 1/2: 2.37 cm2
MV VTI: 2.43 cm2
S' Lateral: 2.56 cm
Single Plane A4C EF: 67.9 %

## 2020-10-22 DIAGNOSIS — E119 Type 2 diabetes mellitus without complications: Secondary | ICD-10-CM | POA: Diagnosis not present

## 2020-10-22 IMAGING — DX LEFT SHOULDER - 2+ VIEW
3 series · 3 of 3 positions shown · non-contrast
Comparison: None

CLINICAL DATA: Shoulder pain

EXAM:
LEFT SHOULDER - 2+ VIEW

[shoulder grashey]
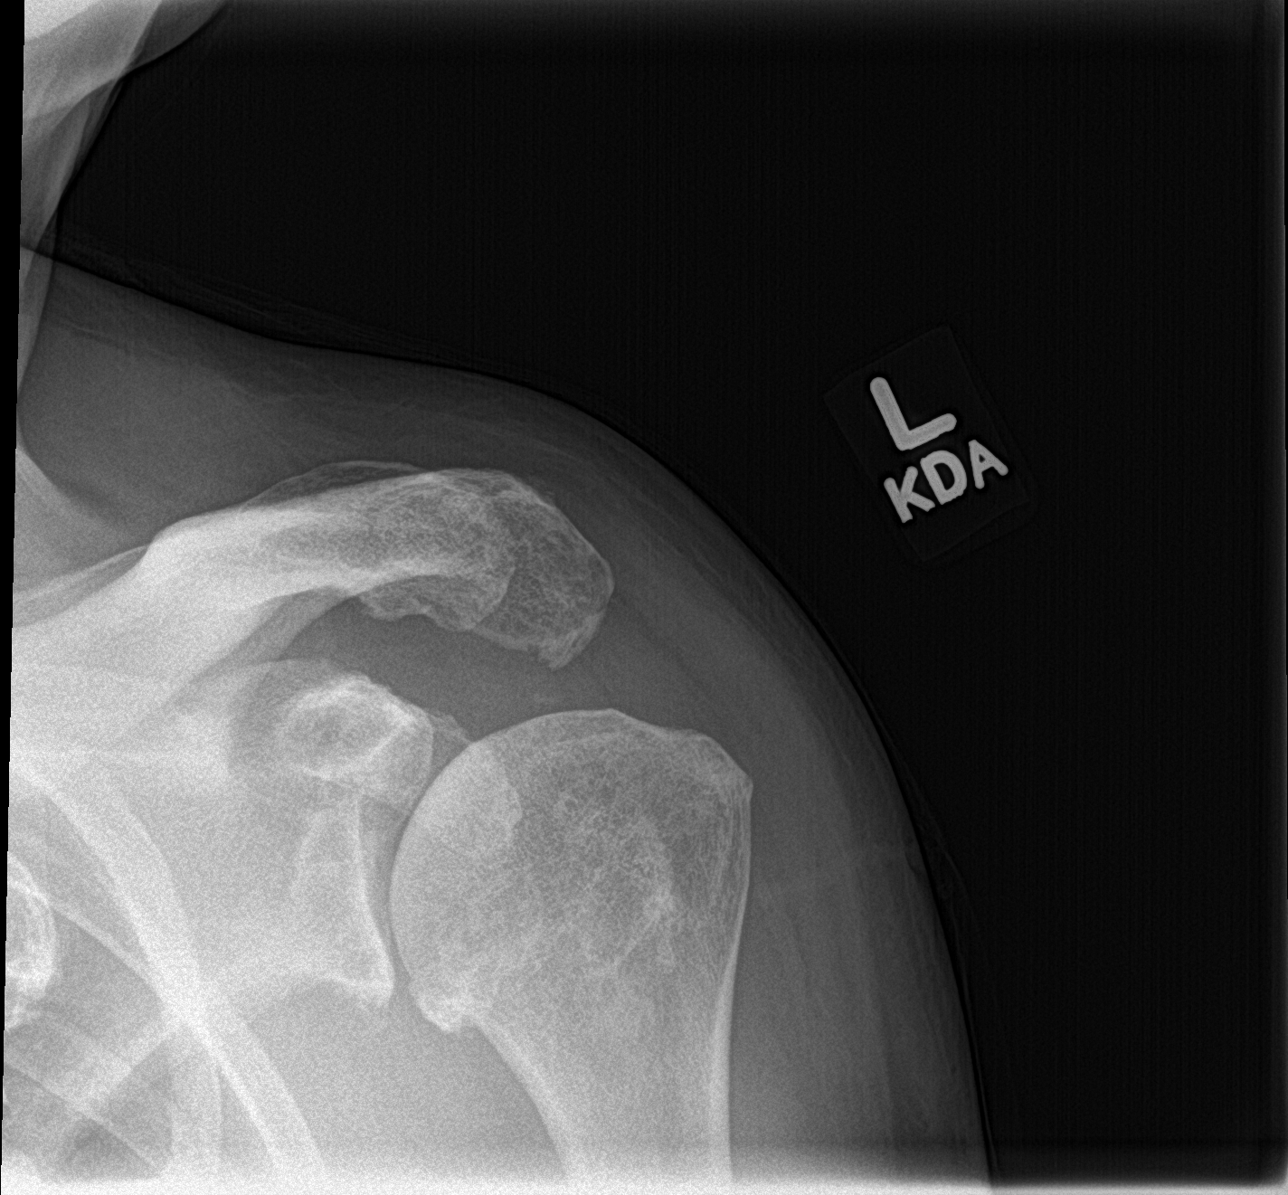

[shoulder y view]
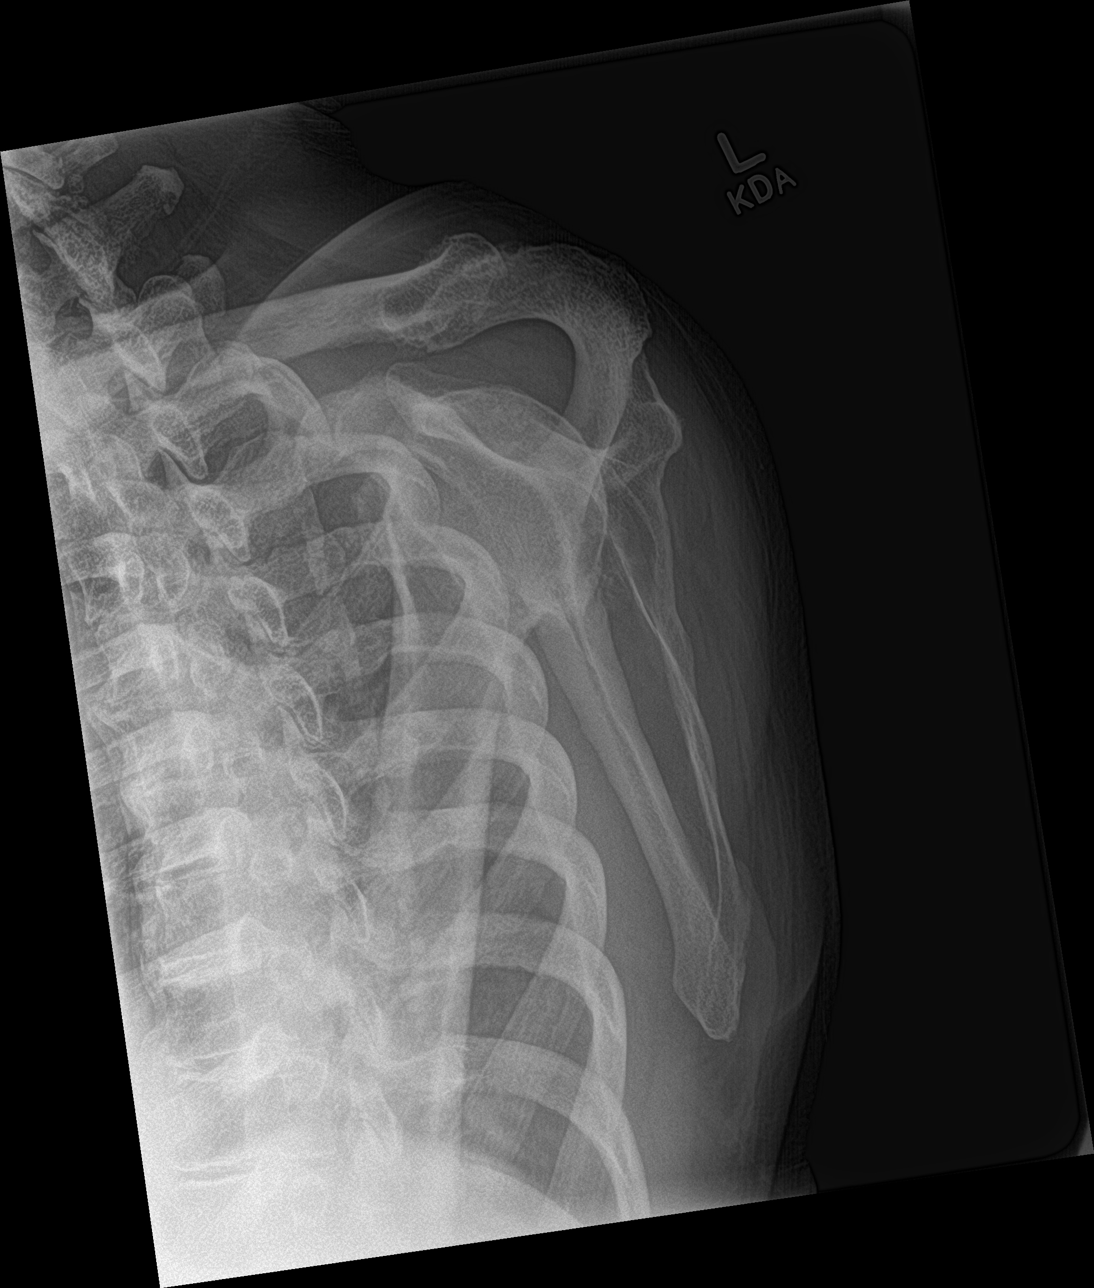

[shoulder axillary]
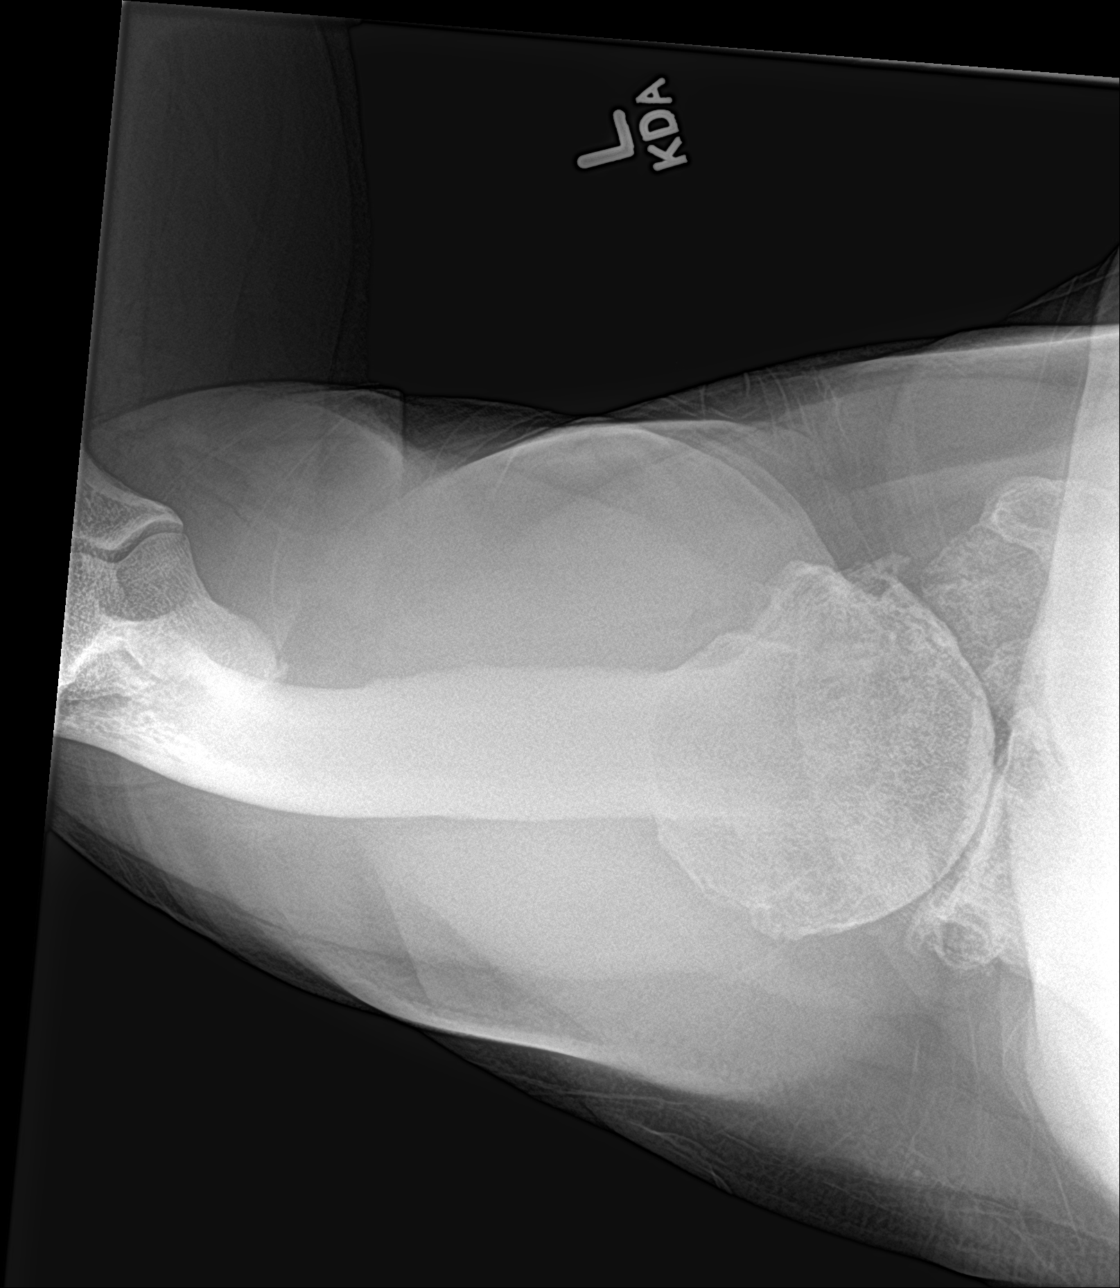

[3 of 3 positions shown; findings below may reference images not displayed]

FINDINGS: There is no acute displaced fracture or dislocation. There is
moderate to severe osteoarthritis of the glenohumeral and
acromioclavicular joints. There are few inferiorly oriented
osteophytes arising from the AC joint.
IMPRESSION: 1. No acute osseous abnormality.
2. Moderate to severe osteoarthritis of the left shoulder.

## 2020-11-03 ENCOUNTER — Ambulatory Visit: Payer: Medicare HMO

## 2020-11-03 ENCOUNTER — Other Ambulatory Visit: Payer: Self-pay

## 2020-11-03 DIAGNOSIS — Z952 Presence of prosthetic heart valve: Secondary | ICD-10-CM | POA: Diagnosis not present

## 2020-11-03 DIAGNOSIS — I359 Nonrheumatic aortic valve disorder, unspecified: Secondary | ICD-10-CM | POA: Diagnosis not present

## 2020-11-03 DIAGNOSIS — Z5181 Encounter for therapeutic drug level monitoring: Secondary | ICD-10-CM | POA: Diagnosis not present

## 2020-11-03 DIAGNOSIS — Z7901 Long term (current) use of anticoagulants: Secondary | ICD-10-CM

## 2020-11-03 LAB — POCT INR: INR: 2.7 (ref 2.0–3.0)

## 2020-11-03 NOTE — Patient Instructions (Signed)
Continue with 1 tablet each Monday, Wednesday and Friday, 1/2 tablet all other days.  Repeat INR in 7 weeks. [phone]

## 2020-11-07 ENCOUNTER — Other Ambulatory Visit: Payer: Self-pay | Admitting: Cardiology

## 2020-11-07 DIAGNOSIS — E785 Hyperlipidemia, unspecified: Secondary | ICD-10-CM

## 2020-11-21 DIAGNOSIS — E119 Type 2 diabetes mellitus without complications: Secondary | ICD-10-CM | POA: Diagnosis not present

## 2020-11-22 ENCOUNTER — Telehealth: Payer: Self-pay

## 2020-11-22 NOTE — Telephone Encounter (Signed)
LM requesting call back to discuss symptoms/schedule appt.   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 He stopped his atorvastatin and wants to know if this affected his memory. Translation No Disp. Time Eilene Ghazi Time) Disposition Final User 11/20/2020 3:00:15 PM Send to Urgent Queue Hilarie Fredrickson 11/20/2020 3:15:06 PM Call Cancelled By Lockney, RN, Caryl Pina 11/20/2020 3:17:10 PM Clinical Call Yes Lorenda Cahill, RN, Hulan Saas Disagree/Comply Comply Caller Understands Yes PreDisposition Home Care Comments User: Bradd Burner, RN Date/Time Eilene Ghazi Time): 11/20/2020 3:14:30 PM Caller states he is having no neurological deficits, eye pain, numbness, or tingling at this time. States if symptom returns and/or worsen he will call back but will follow up with provider on Monday. User: Bradd Burner, RN Date/Time Eilene Ghazi Time): 11/20/2020 3:15:35 PM Accidentally clicked call cancel, no cancellation

## 2020-11-23 NOTE — Telephone Encounter (Signed)
Patient reports he rested Saturday and increased his water intake and was feeling much better Sunday.   He agrees to go to the er if any stroke symptoms.

## 2020-12-03 DIAGNOSIS — Z8546 Personal history of malignant neoplasm of prostate: Secondary | ICD-10-CM | POA: Diagnosis not present

## 2020-12-05 DIAGNOSIS — E785 Hyperlipidemia, unspecified: Secondary | ICD-10-CM

## 2020-12-06 DIAGNOSIS — L249 Irritant contact dermatitis, unspecified cause: Secondary | ICD-10-CM | POA: Diagnosis not present

## 2020-12-06 MED ORDER — ROSUVASTATIN CALCIUM 40 MG PO TABS: 40.0000 mg | ORAL_TABLET | Freq: Every day | ORAL | 3 refills | Status: DC

## 2020-12-07 ENCOUNTER — Ambulatory Visit (INDEPENDENT_AMBULATORY_CARE_PROVIDER_SITE_OTHER): Payer: Medicare HMO | Admitting: Family

## 2020-12-07 ENCOUNTER — Other Ambulatory Visit: Payer: Self-pay

## 2020-12-07 VITALS — BP 145/71 | HR 63 | Temp 98.2°F | Resp 16 | Wt 219.0 lb

## 2020-12-07 DIAGNOSIS — G4733 Obstructive sleep apnea (adult) (pediatric): Secondary | ICD-10-CM | POA: Diagnosis not present

## 2020-12-07 DIAGNOSIS — N183 Chronic kidney disease, stage 3 unspecified: Secondary | ICD-10-CM

## 2020-12-07 DIAGNOSIS — I2510 Atherosclerotic heart disease of native coronary artery without angina pectoris: Secondary | ICD-10-CM

## 2020-12-07 DIAGNOSIS — M1A9XX0 Chronic gout, unspecified, without tophus (tophi): Secondary | ICD-10-CM

## 2020-12-07 DIAGNOSIS — E1122 Type 2 diabetes mellitus with diabetic chronic kidney disease: Secondary | ICD-10-CM

## 2020-12-07 DIAGNOSIS — Z794 Long term (current) use of insulin: Secondary | ICD-10-CM

## 2020-12-07 DIAGNOSIS — R413 Other amnesia: Secondary | ICD-10-CM | POA: Diagnosis not present

## 2020-12-07 MED ORDER — COLCHICINE 0.6 MG PO TABS
ORAL_TABLET | ORAL | 1 refills | Status: DC
Start: 2020-12-07 — End: 2021-10-19

## 2020-12-07 NOTE — Assessment & Plan Note (Signed)
Has a CPAP, does not wear it.  Will refer to sleep specialist.

## 2020-12-07 NOTE — Progress Notes (Signed)
Subjective:   By signing my name below, I, [name], attest that this documentation has been prepared under the direction and in the presence of David Alar, NP, 12/07/2020   Patient ID: [name], male    DOB: March 06, 1945, 75 y.o.   MRN: [id number]  Chief Complaint  Patient presents with   Diabetes    Here for follow up    HPI Patient is in today for an office visit. His wife is present in the room with him today.  Keratosis: He has been working with another provider on his keratosis on his arms and hands. He has been receiving treatment for the keratosis and recently they started becoming raised bumps. His dermatologist prescribed him a cream to help with this and noted that if they do not improve they will biopsy them.  Muscle pain: He notes that when his Atorvastatin was increased to 80 mg he started experiencing bad muscle pain in his legs and hip. Cardiology advised him to stop the 80 mg and begin 40 mg Crestor.  He just began crestor last night.  Blood sugar: His blood sugar has been well maintained but he has been experiencing episodes of low readings. His wife notes that they began putting juices in all of their vehicles to help prevent this.  Sleep: He has been given a C-pap device to use during sleep. He has not been using it consistently but does plan on using it again. He notes that it does help with his sleep but is not necessary. His wife notes that his snoring has improved but is not completely gone.  Gout: He notes he has not had any recent episodes of gout flare ups.  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 He did not tell his wife about this until afterwards. He denies any intense episodes of memory loss before. He mentions that he is no longer taking Asprin at this time at the direction of cardiologist. He does  continue coumadin which is being managed by the coumadin clinic.  Immunizations: He just received his flu shot and updated booster Covid-19 booster at another location.  Health Maintenance Due  Topic Date Due   Zoster Vaccines- Shingrix (1 of 2) Never done   COLONOSCOPY (Pts 45-58yr Insurance coverage will need to be confirmed)  10/06/2019   FOOT EXAM  01/14/2020    Past Medical History:  Diagnosis Date   Allergy    Aortic stenosis    CAD (coronary artery disease)    cabg   Cancer ([place] 2011   prostate   Erectile dysfunction    Heart murmur    Hemochromatosis 02/14/2011   Hemorrhoids    History of gout    History of hepatitis 1974   Hyperlipidemia    Hypertension    Hypertr obst cardiomyop    Obesity    moderate   Sensorineural hearing loss    Type II or unspecified type diabetes mellitus without mention of complication, not stated as uncontrolled     Past Surgical History:  Procedure Laterality Date   AORTIC VALVE REPLACEMENT  11/14/2003   [facility]   APPENDECTOMY  1990   CHOLECYSTECTOMY  1990   CORONARY ARTERY BYPASS GRAFT  10/2003   HERNIA REPAIR  1999   right, inguinal   HERNIA REPAIR  2002   left,  inguinal   HIP SURGERY  2006   right hip   PILONIDAL CYST EXCISION  1964   prostate seed implant  3/12   TONSILLECTOMY  childhood    Family History  Problem Relation Age of Onset   Heart disease Mother    Stroke Mother    Heart disease Father    Asperger's syndrome Son    Hyperlipidemia Son    Coronary artery disease Brother    Cancer Neg Hx        negative for colon cancer    Social History   Socioeconomic History   Marital status: Married    Spouse name: Not on file   Number of children: Not on file   Years of education: Not on file   Highest education level: Not on file  Occupational History   Not on file  Tobacco Use   Smoking status: Light Smoker    Types: Cigars   Smokeless tobacco: Never  Substance and Sexual Activity   Alcohol  use: Yes    Alcohol/week: 0.0 standard drinks    Comment: 4 glasses wine a month   Drug use: No   Sexual activity: Never  Other Topics Concern   Not on file  Social History Narrative   Not on file   Social Determinants of Health   Financial Resource Strain: Not on file  Food Insecurity: Not on file  Transportation Needs: Not on file  Physical Activity: Not on file  Stress: Not on file  Social Connections: Not on file  Intimate Partner Violence: Not on file    Outpatient Medications Prior to Visit  Medication Sig Dispense Refill   AMBULATORY NON FORMULARY MEDICATION cpap cushions            AirFit F20 (Size: Large)           AirFit F30 (Size: Med)   Please FAX the prescription to:  [phone] 12 each prn   betamethasone dipropionate (DIPROLENE) 0.05 % cream Apply topically 2 (two) times daily as needed. To eczema rash 30 g 1   Blood Glucose Monitoring Suppl Supplies MISC Use for monitoring glucose level 100 each 1   Continuous Blood Gluc Sensor (DEXCOM G6 SENSOR) MISC 1 Device by Does not apply route as directed. 9 each 3   Continuous Blood Gluc Transmit (DEXCOM G6 TRANSMITTER) MISC 1 Device by Does not apply route as directed. 1 each 3   dapagliflozin propanediol (FARXIGA) 5 MG TABS tablet Take 1 tablet (5 mg total) by mouth daily before breakfast. 30 tablet 1   fenofibrate (TRICOR) 145 MG tablet TAKE 1 TABLET BY MOUTH EVERY DAY 90 tablet 0   furosemide (LASIX) 20 MG tablet TAKE 1 TABLET BY MOUTH EVERY DAY AS NEEDED FOR SWELLING 90 tablet 0   glucose blood (ONETOUCH VERIO) test strip Use as instructed 100 strip 12   imiquimod (ALDARA) 5 % cream Apply topically 3 (three) times a week.     insulin aspart (NOVOLOG FLEXPEN) 100 UNIT/ML FlexPen Max daily 35 units 30 mL 3   Insulin Glargine (BASAGLAR KWIKPEN) 100 UNIT/ML Inject 38 Units into the skin daily. 30 mL 4   Insulin Pen Needle 32G X 4 MM MISC 1 Device by Does not apply route in the morning, at noon, in the evening, and  at bedtime. 400 each 3   metoprolol succinate (TOPROL-XL) 25 MG 24 hr tablet TAKE 1 TABLET BY MOUTH EVERY DAY 90 tablet 1   Na Sulfate-K Sulfate-Mg Sulf (SUPREP BOWEL PREP  KIT) 17.5-3.13-1.6 GM/177ML SOLN Take 1 kit by mouth as directed. 324 mL 0   OneTouch Delica Lancets 81K MISC USE AS DIRECTED 100 each 1   predniSONE (DELTASONE) 20 MG tablet Take by mouth.     rosuvastatin (CRESTOR) 40 MG tablet Take 1 tablet (40 mg total) by mouth daily. 90 tablet 3   tacrolimus (PROTOPIC) 0.1 % ointment SMARTSIG:1 Topical Daily PRN     triamcinolone ointment (KENALOG) 0.1 % Apply 1 application topically 2 (two) times daily.     warfarin (COUMADIN) 7.5 MG tablet TAKE 1-2 TABLETS DAILY OR AS PRESCRIBED BY COUMADIN CLINIC 180 tablet 0   colchicine 0.6 MG tablet TAKE 2 TABS BY MOUTH NOW AND THEN 1 TAB IN 1 HOUR FOR GOUT. (MAX 3 TABS/24 HRS) 9 tablet 1   No facility-administered medications prior to visit.    No Known Allergies  Review of Systems  HENT:  Positive for hearing loss.   Cardiovascular:  Positive for leg swelling (improved since onset but comes and goes).  Musculoskeletal:  Positive for joint pain (In legs and hips).  Skin:        (+) keratosis rash  Psychiatric/Behavioral:  Positive for memory loss (one episode recently).       Objective:    Physical Exam Constitutional:      General: He is not in acute distress.    Appearance: Normal appearance. He is not ill-appearing.  HENT:     Head: Normocephalic and atraumatic.     Right Ear: External ear normal.     Left Ear: External ear normal.  Eyes:     Extraocular Movements: Extraocular movements intact.     Pupils: Pupils are equal, round, and reactive to light.  Cardiovascular:     Rate and Rhythm: Normal rate and regular rhythm.     Heart sounds: No murmur heard.   No gallop.     Comments: Mechanical valve click noted.  Pulmonary:     Effort: Pulmonary effort is normal. No respiratory distress.     Breath sounds: Normal breath  sounds. No wheezing or rales.  Musculoskeletal:     Right lower leg: Swelling present. 2+ Edema present.     Left lower leg: Swelling present. 2+ Edema present.     Right ankle: Swelling (2+) present.     Left ankle: Swelling (2+) present.  Lymphadenopathy:     Cervical: No cervical adenopathy.  Skin:    General: Skin is warm and dry.     Findings: Lesion (Irritated lesions on left forearm) present.  Neurological:     Mental Status: He is alert and oriented to person, place, and time.  Psychiatric:        Behavior: Behavior normal.        Judgment: Judgment normal.    BP (!) 145/71 (BP Location: Right Arm, Patient Position: Sitting, Cuff Size: Large)   Pulse 63   Temp 98.2 F (36.8 C) (Oral)   Resp 16   Wt 219 lb (99.3 kg)   SpO2 98%   BMI 31.88 kg/m  Wt Readings from Last 3 Encounters:  12/07/20 219 lb (99.3 kg)  09/27/20 219 lb 1.3 oz (99.4 kg)  08/30/20 217 lb (98.4 kg)       Assessment & Plan:   Problem List Items Addressed This Visit       Unprioritized   Type 2 diabetes mellitus with stage 3 chronic kidney disease, with long-term current use of insulin ([place])    Lab Results  Component Value Date   HGBA1C 9.1 (A) 08/17/2020   HGBA1C 9.6 (A) 05/18/2020   HGBA1C 8.8 (A) 02/03/2020   Lab Results  Component Value Date   MICROALBUR <0.7 06/02/2020   [place] 99 09/22/2019   CREATININE 1.39 06/02/2020  Pt is being managed by Endocrinology.        Relevant Orders   Comp Met (CMET)   OSA (obstructive sleep apnea) - Primary    Has a CPAP, does not wear it.  Will refer to sleep specialist.        Relevant Orders   Ambulatory referral to Pulmonology   Memory loss    I am concerned about the possibility of recent TIA. Will check CT head and carotid doppler.  He had a recent 2D echo.       Relevant Orders   CT HEAD WO CONTRAST (5MM)   US Carotid Duplex Bilateral   Gout    Stable.  Monitor.       Other Visit Diagnoses     Coronary artery disease  involving native heart without angina pectoris, unspecified vessel or lesion type          Meds ordered this encounter  Medications   colchicine 0.6 MG tablet    Sig: TAKE 2 TABS BY MOUTH NOW AND THEN 1 TAB IN 1 HOUR FOR GOUT. (MAX 3 TABS/24 HRS)    Dispense:  9 tablet    Refill:  1    Order Specific Question:   Supervising Provider    Answer:   Penni Homans A [[id number]]    I, David Alar, NP, personally preformed the services described in this documentation.  All medical record entries made by the scribe were at my direction and in my presence.  I have reviewed the chart and discharge instructions (if applicable) and agree that the record reflects my personal performance and is accurate and complete. 12/07/2020  I,[name],acting as a Education administrator for Nance Pear, NP.,have documented all relevant documentation on the behalf of Nance Pear, NP,as directed by  Nance Pear, NP while in the presence of Nance Pear, NP.  Nance Pear, NP

## 2020-12-07 NOTE — Patient Instructions (Addendum)
Please complete lab work prior to leaving. You should be contacted about scheduling your CT scan and carotid ultrasound.  Go to the ER if you develop recurrent issues with memory or if you develop numbness/weakness.

## 2020-12-07 NOTE — Assessment & Plan Note (Signed)
I am concerned about the possibility of recent TIA. Will check CT head and carotid doppler.  He had a recent 2D echo.

## 2020-12-07 NOTE — Assessment & Plan Note (Signed)
Stable.  Monitor.  

## 2020-12-07 NOTE — Assessment & Plan Note (Addendum)
Lab Results  Component Value Date   HGBA1C 9.1 (A) 08/17/2020   HGBA1C 9.6 (A) 05/18/2020   HGBA1C 8.8 (A) 02/03/2020   Lab Results  Component Value Date   MICROALBUR <0.7 06/02/2020   [name] 99 09/22/2019   CREATININE 1.39 06/02/2020   Pt is being managed by Endocrinology.

## 2020-12-14 ENCOUNTER — Ambulatory Visit: Payer: Medicare HMO | Admitting: Internal Medicine

## 2020-12-18 ENCOUNTER — Encounter: Payer: Self-pay | Admitting: Internal Medicine

## 2020-12-20 DIAGNOSIS — D485 Neoplasm of uncertain behavior of skin: Secondary | ICD-10-CM | POA: Diagnosis not present

## 2020-12-20 DIAGNOSIS — C44629 Squamous cell carcinoma of skin of left upper limb, including shoulder: Secondary | ICD-10-CM | POA: Diagnosis not present

## 2020-12-21 ENCOUNTER — Encounter: Payer: Self-pay | Admitting: Internal Medicine

## 2020-12-21 ENCOUNTER — Encounter: Payer: Self-pay | Admitting: Cardiology

## 2020-12-21 ENCOUNTER — Other Ambulatory Visit: Payer: Self-pay

## 2020-12-21 ENCOUNTER — Ambulatory Visit (INDEPENDENT_AMBULATORY_CARE_PROVIDER_SITE_OTHER): Payer: Medicare HMO | Admitting: Internal Medicine

## 2020-12-21 VITALS — BP 124/72 | HR 64 | Ht 69.5 in | Wt 215.0 lb

## 2020-12-21 DIAGNOSIS — N183 Chronic kidney disease, stage 3 unspecified: Secondary | ICD-10-CM | POA: Diagnosis not present

## 2020-12-21 DIAGNOSIS — E1122 Type 2 diabetes mellitus with diabetic chronic kidney disease: Secondary | ICD-10-CM

## 2020-12-21 DIAGNOSIS — Z794 Long term (current) use of insulin: Secondary | ICD-10-CM | POA: Diagnosis not present

## 2020-12-21 LAB — BASIC METABOLIC PANEL
BUN: 30 mg/dL — ABNORMAL HIGH (ref 6–23)
CO2: 26 mEq/L (ref 19–32)
Calcium: 9.2 mg/dL (ref 8.4–10.5)
Chloride: 100 mEq/L (ref 96–112)
Creatinine, Ser: 1.45 mg/dL (ref 0.40–1.50)
GFR: 47.06 mL/min — ABNORMAL LOW (ref >60.00)
Glucose, Bld: 176 mg/dL — ABNORMAL HIGH (ref 70–99)
Potassium: 3.9 mEq/L (ref 3.5–5.1)
Sodium: 136 mEq/L (ref 135–145)

## 2020-12-21 LAB — POCT GLYCOSYLATED HEMOGLOBIN (HGB A1C): Hemoglobin A1C: 9.2 % — AB (ref 4.0–5.6)

## 2020-12-21 MED ORDER — DAPAGLIFLOZIN PROPANEDIOL 10 MG PO TABS: 10.0000 mg | ORAL_TABLET | Freq: Every day | ORAL | 3 refills | Status: DC

## 2020-12-21 NOTE — Progress Notes (Signed)
Name: [name]  Age/ Sex: 75 y.o., male   MRN/ DOB: [id number], [birth year]     PCP: [name], NP   Reason for Endocrinology Evaluation: Type 2 Diabetes Mellitus  Initial Endocrine Consultative Visit: 11/11/2018    PATIENT IDENTIFIER: [name] is a 75 y.o. male with a past medical history of T2DM, OSA, HTN, CAD ( S/P CABG) . The patient has followed with Endocrinology clinic since 11/11/2018 for consultative assistance with management of his diabetes.  DIABETIC HISTORY:  Mr. Henes was diagnosed with T2DM in 2005. He was on metformin which was stopped due to renal dysfunction . Has been on insulin since 2015. His hemoglobin A1c has ranged from 6.3% in 2015, peaking at 8.3% in 2020.  On his initial visit to our clinic he was on basal insulin only. With an A1c of 8.3. Trulicity was added.    Novolog started 04/2020 due to an A1c 0.8%  Stopped trulicity due to cost  Started Farxiga 07/2020 through pt assistance program   SUBJECTIVE:   During the last visit (08/17/2020): A1c of 9.1 %.  We stopped trulicity , started [place]  and continued basal/prandial regimen.      Today (12/21/2020): Mr. Needs is here for a  follow up on diabetes management. He is accompanied by his wife today . He checks his blood sugars multiple  times daily, through dexcom.  The patient has not had hypoglycemic episodes since the last clinic visit.  Denies nausea, vomiting nor diarrhea    Saw PCP 12/07/2020- he has a pending CT of head and carotid doppler due to memory loss   He had to go on prednisone for 1 week for skin rash     HOME DIABETES REGIMEN:  Basaglar 28  units daily  Novolog 10 units with breakfast and lunch and 14 units with supper  Farxiga 10 mg daily             DIABETIC COMPLICATIONS: Microvascular complications:  CKDIII- Dr. Hollie Salk  Denies: neuropathy, retinopathy  Last eye exam: Completed 08/2019    Macrovascular complications:  CAD (S/P  CABG )  Denies: PVD, CVA    HISTORY:  Past Medical History:  Past Medical History:  Diagnosis Date   Allergy    Aortic stenosis    CAD (coronary artery disease)    cabg   Cancer ([place]) 2011   prostate   Erectile dysfunction    Heart murmur    Hemochromatosis 02/14/2011   Hemorrhoids    History of gout    History of hepatitis 1974   Hyperlipidemia    Hypertension    Hypertr obst cardiomyop    Obesity    moderate   Sensorineural hearing loss    Type II or unspecified type diabetes mellitus without mention of complication, not stated as uncontrolled    Past Surgical History:  Past Surgical History:  Procedure Laterality Date   AORTIC VALVE REPLACEMENT  11/14/2003   [facility]   APPENDECTOMY  1990   CHOLECYSTECTOMY  1990   CORONARY ARTERY BYPASS GRAFT  10/2003   HERNIA REPAIR  1999   right, inguinal   HERNIA REPAIR  2002   left, inguinal   HIP SURGERY  2006   right hip   PILONIDAL CYST EXCISION  1964   prostate seed implant  3/12   TONSILLECTOMY  childhood   Social History:  reports that he has been smoking cigars. He has never used smokeless tobacco.  He reports current alcohol use. He reports that he does not use drugs. Family History:  Family History  Problem Relation Age of Onset   Heart disease Mother    Stroke Mother    Heart disease Father    Asperger's syndrome Son    Hyperlipidemia Son    Coronary artery disease Brother    Cancer Neg Hx        negative for colon cancer     HOME MEDICATIONS: Allergies as of 12/21/2020   No Known Allergies      Medication List        Accurate as of December 21, 2020  4:33 PM. If you have any questions, ask your nurse or doctor.          STOP taking these medications    predniSONE 20 MG tablet Commonly known as: DELTASONE Stopped by: Dorita Sciara, MD       TAKE these medications    AMBULATORY NON FORMULARY MEDICATION cpap cushions            AirFit F20 (Size: Large)            AirFit F30 (Size: Med)   Please FAX the prescription to:  [phone]   Basaglar KwikPen 100 UNIT/ML Inject 38 Units into the skin daily.   betamethasone dipropionate 0.05 % cream Apply topically 2 (two) times daily as needed. To eczema rash   Blood Glucose Monitoring Suppl Supplies Misc Use for monitoring glucose level   colchicine 0.6 MG tablet TAKE 2 TABS BY MOUTH NOW AND THEN 1 TAB IN 1 HOUR FOR GOUT. (MAX 3 TABS/24 HRS)   dapagliflozin propanediol 5 MG Tabs tablet Commonly known as: Farxiga Take 1 tablet (5 mg total) by mouth daily before breakfast.   Dexcom G6 Sensor Misc 1 Device by Does not apply route as directed.   Dexcom G6 Transmitter Misc 1 Device by Does not apply route as directed.   fenofibrate 145 MG tablet Commonly known as: TRICOR TAKE 1 TABLET BY MOUTH EVERY DAY   furosemide 20 MG tablet Commonly known as: LASIX TAKE 1 TABLET BY MOUTH EVERY DAY AS NEEDED FOR SWELLING   imiquimod 5 % cream Commonly known as: ALDARA Apply topically 3 (three) times a week.   Insulin Pen Needle 32G X 4 MM Misc 1 Device by Does not apply route in the morning, at noon, in the evening, and at bedtime.   metoprolol succinate 25 MG 24 hr tablet Commonly known as: TOPROL-XL TAKE 1 TABLET BY MOUTH EVERY DAY   NovoLOG FlexPen 100 UNIT/ML FlexPen Generic drug: insulin aspart Max daily 35 units   OneTouch Delica Lancets 42V Misc USE AS DIRECTED   OneTouch Verio test strip Generic drug: glucose blood Use as instructed   rosuvastatin 40 MG tablet Commonly known as: CRESTOR Take 1 tablet (40 mg total) by mouth daily.   Suprep Bowel Prep Kit 17.5-3.13-1.6 GM/177ML Soln Generic drug: Na Sulfate-K Sulfate-Mg Sulf Take 1 kit by mouth as directed.   tacrolimus 0.1 % ointment Commonly known as: PROTOPIC SMARTSIG:1 Topical Daily PRN   triamcinolone ointment 0.1 % Commonly known as: KENALOG Apply 1 application topically 2 (two) times daily.   warfarin 7.5 MG  tablet Commonly known as: COUMADIN Take as directed by the anticoagulation clinic. If you are unsure how to take this medication, talk to your nurse or doctor. Original instructions: TAKE 1-2 TABLETS DAILY OR AS PRESCRIBED BY COUMADIN CLINIC         OBJECTIVE:  Vital Signs: BP 124/72 (BP Location: Left Arm, Patient Position: Sitting, Cuff Size: Small)   Pulse 64   Ht 5' 9.5" (1.765 m)   Wt 215 lb (97.5 kg)   SpO2 96%   BMI 31.29 kg/m   Wt Readings from Last 3 Encounters:  12/21/20 215 lb (97.5 kg)  12/07/20 219 lb (99.3 kg)  09/27/20 219 lb 1.3 oz (99.4 kg)     Exam: General: Pt appears well and is in NAD  Lungs: Clear with good BS bilat with no rales, rhonchi, or wheezes  Heart: RRR with normal S1 and S2 and no gallops; no murmurs; no rub  Extremities: No pretibial edema.   Neuro: MS is good with appropriate affect, pt is alert and Ox3         DATA REVIEWED:  Lab Results  Component Value Date   HGBA1C 9.2 (A) 12/21/2020   HGBA1C 9.1 (A) 08/17/2020   HGBA1C 9.6 (A) 05/18/2020   Lab Results  Component Value Date   MICROALBUR <0.7 06/02/2020   LDLCALC 99 09/22/2019   CREATININE 1.45 12/21/2020   Lab Results  Component Value Date   MICRALBCREAT 3.4 06/02/2020     Lab Results  Component Value Date   CHOL 164 09/09/2020   HDL 27.20 (L) 09/09/2020   LDLCALC 99 09/22/2019   LDLDIRECT 86.0 09/09/2020   TRIG 390.0 (H) 09/09/2020   CHOLHDL 6 09/09/2020        Latest Reference Range & Units 12/21/20 11:09  Sodium 135 - 145 mEq/L 136  Potassium 3.5 - 5.1 mEq/L 3.9  Chloride 96 - 112 mEq/L 100  CO2 19 - 32 mEq/L 26  Glucose 70 - 99 mg/dL 176 (H)  BUN 6 - 23 mg/dL 30 (H)  Creatinine 0.40 - 1.50 mg/dL 1.45  Calcium 8.4 - 10.5 mg/dL 9.2  GFR >60.00 mL/min 47.06 (L)    ASSESSMENT / PLAN / RECOMMENDATIONS:   1) Type 2 Diabetes Mellitus, Poorly controlled, With CKD III , Neuropathic and Macrovascular complications - Most recent A1c of 9.2%. Goal A1c <  7.5%.   - Unfortunately his A1c has not changed but his BG's are trending down during the night but high during the day, will need to adjust prandial insulin as below  - We had stopped trulicity due to ineffectiveness and high cost, we started him on Farxiga through pt assistance program    MEDICATIONS: - Continue  Farxiga 10 mg , 1 tablet every morning  - Continue Basaglar 28 units daily  - Change Novolog to 12 units with Breakfast, 12 units with Lunch and 14 units with Supper  - Correction Scale : Humalog (BG -130/25) starting at 185   EDUCATION / INSTRUCTIONS: BG monitoring instructions: Patient is instructed to check his blood sugars 3 times a day, before each meal. Call [facility] if: BG persistently < 70 I reviewed the Rule of 15 for the treatment of hypoglycemia in detail with the patient. Literature supplied.   2) Diabetic complications:  Eye: Does not have known diabetic retinopathy. Neuro/ Feet: Does have known diabetic peripheral neuropathy. Renal: Patient does have known baseline CKD. He is on an ACEI/ARB at present. He sees Dr. Hollie Salk    F/U in 4 months    Signed electronically by: Mack Guise, MD  [facility]  [address] [place], [place] [address] Phone: [phone] FAX: [phone]   CC: [name], NP [address] [place]  [place] [address] Phone: [phone]  Fax: [phone]  Return to Endocrinology clinic as below: Future Appointments  Date Time Provider [place]  12/22/2020 11:00 AM [facility]  12/27/2020  8:30 AM [facility]  12/27/2020  9:00 AM [facility]  03/09/2021 11:00 AM [name], NP [facility]  03/16/2021 11:40 AM Lelon Perla, MD [facility] None  04/26/2021  9:10 AM Nancy Manuele, Melanie Crazier, MD [facility]

## 2020-12-21 NOTE — Patient Instructions (Addendum)
-   Continue Farxiga 10 mg , 1 tablet every morning  - Continue Basaglar 28 units daily  - Change Novolog to 12 units with Breakfast, 12 units with Lunch and 14 units with Supper  Novolog correctional insulin: ADD extra units on insulin to your meal-time Novolog dose if your blood sugars are higher than 185. Use the scale below to help guide you:   Blood sugar before meal Number of units to inject  Less than 185 0 unit  186 -  210 1 units  211 -  235 2 units  236 -  260 3 units  261 -  285 4 units  286 -  310 5 units  311 -  335 6 units  336 -  360 7 units  361 -  385 8 units  386 - 410  9 units       HOW TO TREAT LOW BLOOD SUGARS (Blood sugar LESS THAN 70 MG/DL) Please follow the RULE OF 15 for the treatment of hypoglycemia treatment (when your (blood sugars are less than 70 mg/dL)   STEP 1: Take 15 grams of carbohydrates when your blood sugar is low, which includes:  3-4 GLUCOSE TABS  OR 3-4 OZ OF JUICE OR REGULAR SODA OR ONE TUBE OF GLUCOSE GEL    STEP 2: RECHECK blood sugar in 15 MINUTES STEP 3: If your blood sugar is still low at the 15 minute recheck --> then, go back to STEP 1 and treat AGAIN with another 15 grams of carbohydrates.

## 2020-12-22 ENCOUNTER — Other Ambulatory Visit: Payer: Self-pay

## 2020-12-22 ENCOUNTER — Ambulatory Visit (INDEPENDENT_AMBULATORY_CARE_PROVIDER_SITE_OTHER): Payer: Medicare HMO

## 2020-12-22 DIAGNOSIS — Z952 Presence of prosthetic heart valve: Secondary | ICD-10-CM

## 2020-12-22 DIAGNOSIS — I359 Nonrheumatic aortic valve disorder, unspecified: Secondary | ICD-10-CM

## 2020-12-22 DIAGNOSIS — E119 Type 2 diabetes mellitus without complications: Secondary | ICD-10-CM | POA: Diagnosis not present

## 2020-12-22 DIAGNOSIS — Z5181 Encounter for therapeutic drug level monitoring: Secondary | ICD-10-CM | POA: Diagnosis not present

## 2020-12-22 DIAGNOSIS — Z7901 Long term (current) use of anticoagulants: Secondary | ICD-10-CM

## 2020-12-22 LAB — POCT INR: INR: 1.8 — AB (ref 2.0–3.0)

## 2020-12-22 NOTE — Telephone Encounter (Signed)
Not sure his pain is from statins.  Probably would discontinue all statins for 4 to 6 weeks to see if his pain improves.  If not we will resume and he can follow-up with primary care for further evaluation of leg pain.  Kirk Ruths

## 2020-12-22 NOTE — Patient Instructions (Signed)
Take 2 tablets today only and then Continue with 1 tablet each Monday, Wednesday and Friday, 1/2 tablet all other days.  Repeat INR in 4 weeks. [phone]

## 2020-12-27 ENCOUNTER — Ambulatory Visit (HOSPITAL_BASED_OUTPATIENT_CLINIC_OR_DEPARTMENT_OTHER)
Admission: RE | Admit: 2020-12-27 | Discharge: 2020-12-27 | Disposition: A | Discharge status: Home / Self Care | Payer: Medicare HMO | Source: Ambulatory Visit | Attending: Family | Admitting: Family

## 2020-12-27 ENCOUNTER — Telehealth: Payer: Self-pay | Admitting: Family

## 2020-12-27 ENCOUNTER — Encounter: Payer: Self-pay | Admitting: Internal Medicine

## 2020-12-27 DIAGNOSIS — G319 Degenerative disease of nervous system, unspecified: Secondary | ICD-10-CM | POA: Diagnosis not present

## 2020-12-27 DIAGNOSIS — Z8673 Personal history of transient ischemic attack (TIA), and cerebral infarction without residual deficits: Secondary | ICD-10-CM | POA: Diagnosis not present

## 2020-12-27 DIAGNOSIS — E785 Hyperlipidemia, unspecified: Secondary | ICD-10-CM | POA: Diagnosis not present

## 2020-12-27 DIAGNOSIS — E119 Type 2 diabetes mellitus without complications: Secondary | ICD-10-CM | POA: Diagnosis not present

## 2020-12-27 DIAGNOSIS — I10 Essential (primary) hypertension: Secondary | ICD-10-CM | POA: Diagnosis not present

## 2020-12-27 DIAGNOSIS — I639 Cerebral infarction, unspecified: Secondary | ICD-10-CM | POA: Insufficient documentation

## 2020-12-27 DIAGNOSIS — J329 Chronic sinusitis, unspecified: Secondary | ICD-10-CM | POA: Diagnosis not present

## 2020-12-27 DIAGNOSIS — R413 Other amnesia: Secondary | ICD-10-CM | POA: Insufficient documentation

## 2020-12-27 DIAGNOSIS — I6523 Occlusion and stenosis of bilateral carotid arteries: Secondary | ICD-10-CM | POA: Diagnosis not present

## 2020-12-27 DIAGNOSIS — I63412 Cerebral infarction due to embolism of left middle cerebral artery: Secondary | ICD-10-CM | POA: Insufficient documentation

## 2020-12-27 NOTE — Telephone Encounter (Signed)
CT head shows a small old stroke on the left side.  I would like for him to see neurology for consultation.

## 2020-12-27 NOTE — Telephone Encounter (Signed)
Results given to patient's wife. She was advised about referral and that they will receive a call from neurology.

## 2020-12-28 ENCOUNTER — Other Ambulatory Visit: Payer: Self-pay

## 2020-12-28 ENCOUNTER — Other Ambulatory Visit (INDEPENDENT_AMBULATORY_CARE_PROVIDER_SITE_OTHER): Payer: Medicare HMO

## 2020-12-28 ENCOUNTER — Encounter: Payer: Self-pay | Admitting: Physician Assistant

## 2020-12-28 ENCOUNTER — Ambulatory Visit: Payer: Medicare HMO | Admitting: Physician Assistant

## 2020-12-28 VITALS — BP 142/84 | HR 65 | Wt 218.0 lb

## 2020-12-28 DIAGNOSIS — R413 Other amnesia: Secondary | ICD-10-CM

## 2020-12-28 LAB — VITAMIN B12: Vitamin B-12: 221 pg/mL (ref 211–911)

## 2020-12-28 LAB — CBC
HCT: 44.6 % (ref 39.0–52.0)
Hemoglobin: 14.7 g/dL (ref 13.0–17.0)
MCHC: 33 g/dL (ref 30.0–36.0)
MCV: 89.4 fl (ref 78.0–100.0)
Platelets: 210 10*3/uL (ref 150.0–400.0)
RBC: 4.99 Mil/uL (ref 4.22–5.81)
RDW: 14.6 % (ref 11.5–15.5)
WBC: 7.5 10*3/uL (ref 4.0–10.5)

## 2020-12-28 LAB — TSH: TSH: 4.39 u[IU]/mL (ref 0.35–5.50)

## 2020-12-28 NOTE — Patient Instructions (Addendum)
It was a pleasure to see you today at our office.   Recommendations:  Neurocognitive evaluation at our office Check labs today Follow up after neurocognitive testing  RECOMMENDATIONS FOR ALL PATIENTS WITH MEMORY PROBLEMS: 1. Continue to exercise (Recommend 30 minutes of walking everyday, or 3 hours every week) 2. Increase social interactions - continue going to [place] and enjoy social gatherings with friends and family 3. Eat healthy, avoid fried foods and eat more fruits and vegetables 4. Maintain adequate blood pressure, blood sugar, and blood cholesterol level. Reducing the risk of stroke and cardiovascular disease also helps promoting better memory. 5. Avoid stressful situations. Live a simple life and avoid aggravations. Organize your time and prepare for the next day in anticipation. 6. Sleep well, avoid any interruptions of sleep and avoid any distractions in the bedroom that may interfere with adequate sleep quality 7. Avoid sugar, avoid sweets as there is a strong link between excessive sugar intake, diabetes, and cognitive impairment We discussed the Mediterranean diet, which has been shown to help patients reduce the risk of progressive memory disorders and reduces cardiovascular risk. This includes eating fish, eat fruits and green leafy vegetables, nuts like almonds and hazelnuts, walnuts, and also use olive oil. Avoid fast foods and fried foods as much as possible. Avoid sweets and sugar as sugar use has been linked to worsening of memory function.  There is always a concern of gradual progression of memory problems. If this is the case, then we may need to adjust level of care according to patient needs. Support, both to the patient and caregiver, should then be put into place.      You have been referred for a neuropsychological evaluation (i.e., evaluation of memory and thinking abilities). Please bring someone with you to this appointment if possible, as it is helpful for the  doctor to hear from both you and another adult who knows you well. Please bring eyeglasses and hearing aids if you wear them.    The evaluation will take approximately 3 hours and has two parts:   The first part is a clinical interview with the neuropsychologist (Dr. Melvyn Novas or Dr. Nicole Kindred). During the interview, the neuropsychologist will speak with you and the individual you brought to the appointment.    The second part of the evaluation is testing with the doctor's technician Hinton Dyer or Maudie Mercury). During the testing, the technician will ask you to remember different types of material, solve problems, and answer some questionnaires. Your family member will not be present for this portion of the evaluation.   Please note: We must reserve several hours of the neuropsychologist's time and the psychometrician's time for your evaluation appointment. As such, there is a No-Show fee of $100. If you are unable to attend any of your appointments, please contact our office as soon as possible to reschedule.    FALL PRECAUTIONS: Be cautious when walking. Scan the area for obstacles that may increase the risk of trips and falls. When getting up in the mornings, sit up at the edge of the bed for a few minutes before getting out of bed. Consider elevating the bed at the head end to avoid drop of blood pressure when getting up. Walk always in a well-lit room (use night lights in the walls). Avoid area rugs or power cords from appliances in the middle of the walkways. Use a walker or a cane if necessary and consider physical therapy for balance exercise. Get your eyesight checked regularly.  FINANCIAL OVERSIGHT:  Supervision, especially oversight when making financial decisions or transactions is also recommended.  HOME SAFETY: Consider the safety of the kitchen when operating appliances like stoves, microwave oven, and blender. Consider having supervision and share cooking responsibilities until no longer able to  participate in those. Accidents with firearms and other hazards in the house should be identified and addressed as well.   ABILITY TO BE LEFT ALONE: If patient is unable to contact 911 operator, consider using LifeLine, or when the need is there, arrange for someone to stay with patients. Smoking is a fire hazard, consider supervision or cessation. Risk of wandering should be assessed by caregiver and if detected at any point, supervision and safe proof recommendations should be instituted.  MEDICATION SUPERVISION: Inability to self-administer medication needs to be constantly addressed. Implement a mechanism to ensure safe administration of the medications.   DRIVING: Regarding driving, in patients with progressive memory problems, driving will be impaired. We advise to have someone else do the driving if trouble finding directions or if minor accidents are reported. Independent driving assessment is available to determine safety of driving.   If you are interested in the driving assessment, you can contact the following:  [employer] in [place]  [place] [phone] or [phone]    [place] refers to food and lifestyle choices that are based on the traditions of countries located on the [employer]. This way of eating has been shown to help prevent certain conditions and improve outcomes for people who have chronic diseases, like kidney disease and heart disease. What are tips for following this plan? Lifestyle  Cook and eat meals together with your family, when possible. Drink enough fluid to keep your urine clear or pale yellow. Be physically active every day. This includes: Aerobic exercise like running or swimming. Leisure activities like gardening, walking, or housework. Get 7-8 hours of sleep each night. If recommended  by your health care provider, drink red wine in moderation. This means 1 glass a day for nonpregnant women and 2 glasses a day for men. A glass of wine equals 5 oz (150 mL). Reading food labels  Check the serving size of packaged foods. For foods such as rice and pasta, the serving size refers to the amount of cooked product, not dry. Check the total fat in packaged foods. Avoid foods that have saturated fat or trans fats. Check the ingredients list for added sugars, such as corn syrup. Shopping  At the grocery store, buy most of your food from the areas near the walls of the store. This includes: Fresh fruits and vegetables (produce). Grains, beans, nuts, and seeds. Some of these may be available in unpackaged forms or large amounts (in bulk). Fresh seafood. Poultry and eggs. Low-fat dairy products. Buy whole ingredients instead of prepackaged foods. Buy fresh fruits and vegetables in-season from local farmers markets. Buy frozen fruits and vegetables in resealable bags. If you do not have access to quality fresh seafood, buy precooked frozen shrimp or canned fish, such as tuna, salmon, or sardines. Buy small amounts of raw or cooked vegetables, salads, or olives from the deli or salad bar at your store. Stock your pantry so you always have certain foods on hand, such as olive oil, canned tuna, canned tomatoes, rice, pasta, and beans. Cooking  Cook foods with extra-virgin olive oil instead of using butter or other vegetable oils. Have meat as a  side dish, and have vegetables or grains as your main dish. This means having meat in small portions or adding small amounts of meat to foods like pasta or stew. Use beans or vegetables instead of meat in common dishes like chili or lasagna. Experiment with different cooking methods. Try roasting or broiling vegetables instead of steaming or sauteing them. Add frozen vegetables to soups, stews, pasta, or rice. Add nuts or seeds for added healthy fat  at each meal. You can add these to yogurt, salads, or vegetable dishes. Marinate fish or vegetables using olive oil, lemon juice, garlic, and fresh herbs. Meal planning  Plan to eat 1 vegetarian meal one day each week. Try to work up to 2 vegetarian meals, if possible. Eat seafood 2 or more times a week. Have healthy snacks readily available, such as: Vegetable sticks with hummus. Greek yogurt. Fruit and nut trail mix. Eat balanced meals throughout the week. This includes: Fruit: 2-3 servings a day Vegetables: 4-5 servings a day Low-fat dairy: 2 servings a day Fish, poultry, or lean meat: 1 serving a day Beans and legumes: 2 or more servings a week Nuts and seeds: 1-2 servings a day Whole grains: 6-8 servings a day Extra-virgin olive oil: 3-4 servings a day Limit red meat and sweets to only a few servings a month What are my food choices? Mediterranean diet Recommended Grains: Whole-grain pasta. Brown rice. Bulgar wheat. Polenta. Couscous. Whole-wheat bread. [place]. Vegetables: Artichokes. Beets. Broccoli. Cabbage. Carrots. Eggplant. Green beans. Chard. Kale. Spinach. Onions. Leeks. Peas. Squash. Tomatoes. Peppers. Radishes. Fruits: Apples. Apricots. Avocado. Berries. Bananas. Cherries. Dates. Figs. Grapes. Lemons. Melon. Oranges. Peaches. Plums. Pomegranate. Meats and other protein foods: Beans. Almonds. Sunflower seeds. Pine nuts. Peanuts. [place]. Salmon. Scallops. Shrimp. [place]. Tilapia. Clams. Oysters. Eggs. Dairy: Low-fat milk. Cheese. Greek yogurt. Beverages: Water. Red wine. Herbal tea. Fats and oils: Extra virgin olive oil. Avocado oil. Grape seed oil. Sweets and desserts: [place] yogurt with honey. Baked apples. Poached pears. Trail mix. Seasoning and other foods: Basil. Cilantro. Coriander. Cumin. Mint. Parsley. Sage. Rosemary. Tarragon. Garlic. Oregano. Thyme. Pepper. Balsalmic vinegar. Tahini. Hummus. Tomato sauce. Olives. Mushrooms. Limit these Grains: Prepackaged  pasta or rice dishes. Prepackaged cereal with added sugar. Vegetables: Deep fried potatoes (french fries). Fruits: Fruit canned in syrup. Meats and other protein foods: Beef. Pork. Lamb. Poultry with skin. Hot dogs. Berniece Salines. Dairy: Ice cream. Sour cream. Whole milk. Beverages: Juice. Sugar-sweetened soft drinks. Beer. Liquor and spirits. Fats and oils: Butter. Canola oil. Vegetable oil. Beef fat (tallow). Lard. Sweets and desserts: Cookies. Cakes. Pies. Candy. Seasoning and other foods: Mayonnaise. Premade sauces and marinades. The items listed may not be a complete list. Talk with your dietitian about what dietary choices are right for you. Summary The Mediterranean diet includes both food and lifestyle choices. Eat a variety of fresh fruits and vegetables, beans, nuts, seeds, and whole grains. Limit the amount of red meat and sweets that you eat. Talk with your health care provider about whether it is safe for you to drink red wine in moderation. This means 1 glass a day for nonpregnant women and 2 glasses a day for men. A glass of wine equals 5 oz (150 mL). This information is not intended to replace advice given to you by your health care provider. Make sure you discuss any questions you have with your health care provider. Document Released: 09/09/2015 Document Revised: 10/12/2015 Document Reviewed: 09/09/2015 Elsevier Interactive Patient Education  2017 [employer].

## 2020-12-28 NOTE — Progress Notes (Signed)
Assessment/Plan:   [name] is a 75 y.o. year old RH male with risk factors including  age, hypertension, hyperlipidemia, DM2, history of CVA, OSA, St 3 CKD, CAD s/p CABG, s/p AVR on Coumadin, h/o prostate cancer, h/o bradycardia, hemochromatosis, B hearing loss seen today for evaluation of memory loss. MoCA today is 28/30 not suggestive of dementia, however, further evaluation needs to be performed, including neurocognitive testing for clarity of the diagnosis in view of a recent CT head without evidence of acute intracranial abnormality but showing small chronic infarct within the left cerebellar hemisphere, mild generalized cerebral and cerebellar atrophy.  Memory Loss without behavioral disturbance  Neurocognitive testing to further evaluate cognitive concerns and determine underlying cause of memory changes, including potential contribution from sleep, anxiety, or depression  Check B12, TSH CBC TSH, CBC Discussed safety both in and out of the home.  Discussed the importance of regular daily schedule with inclusion of crossword puzzles to maintain brain function.  Continue to monitor mood with PCP.  Stay active at least 30 minutes at least 3 times a week.  Naps should be scheduled and should be no longer than 60 minutes and should not occur after 2 PM.  Mediterranean diet is recommended  Folllow up once results above are available   Subjective:    The patient is seen in neurologic consultation at the request of [name], NP for the evaluation of memory.  The patient is accompanied by his wife who supplements the history. This is a 75 y.o. year old male who has had memory issues for about 6 months to a year, when his wife noticed that he was beginning to ask the same questions. He reports that "it takes me a little time but I will get they are and I will remember the names ".  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 He has become more irritable, "quicker than he used to ". When driving, he forgot why they were going "that way ".  He used to be excellent with directions, and for him is not quite as easy for the last 2 months.   The patient denies any seizures, but his wife reports that he has been under a lot of stress, "between some family members that have been hospitalized, and caring for a 49 year old son with autism ".  Wife also reports that there may be some depression as well, as he reports "every day is the same ".  He denies leaving objects in unusual places.  He does enjoy doing some activities outdoor, walking 3 times a week, and going to [employer].  He admits to not eating very healthy foods, but he is going to try to change his diet for healthy one.  He sleeps well, denies vivid dreams "although "my doctor told me that I need another sleep study for sleep apnea".  Denies hallucinations or paranoia.  No hygiene concerns, he is independent of bathing and dressing.  His medications are in the pillbox, he occasionally may miss a dose.  His finances are in automatic payment.  He does not cook.  He ambulates with some difficulty due to arthritic pain, he has fallen a couple of times in church, but denies head injuries.Denies headaches, double vision, dizziness, focal numbness or tingling, unilateral weakness or tremors or anosmia. No history of seizures.  Denies urine incontinence, retention, constipation or diarrhea.  Denies anosmia.  OSA, ETOH or Tobacco. Family History negative for Alzheimer's disease, Mo TIA     No Known Allergies  Current Outpatient Medications  Medication Instructions   AMBULATORY NON FORMULARY MEDICATION cpap cushions<BR><BR>          AirFit F20 (Size: Large)<BR>          AirFit F30 (Size: Med)<BR> <BR>Please FAX the prescription to:  [phone]   Basaglar KwikPen 38 Units, Subcutaneous, Daily   betamethasone  dipropionate (DIPROLENE) 0.05 % cream Topical, 2 times daily PRN, To eczema rash   Blood Glucose Monitoring Suppl Supplies MISC Use for monitoring glucose level   colchicine 0.6 MG tablet TAKE 2 TABS BY MOUTH NOW AND THEN 1 TAB IN 1 HOUR FOR GOUT. (MAX 3 TABS/24 HRS)   Continuous Blood Gluc Sensor (DEXCOM G6 SENSOR) MISC 1 Device, Does not apply, As directed   Continuous Blood Gluc Transmit (DEXCOM G6 TRANSMITTER) MISC 1 Device, Does not apply, As directed   dapagliflozin propanediol (FARXIGA) 10 mg, Oral, Daily before breakfast   fenofibrate (TRICOR) 145 MG tablet TAKE 1 TABLET BY MOUTH EVERY DAY   furosemide (LASIX) 20 MG tablet TAKE 1 TABLET BY MOUTH EVERY DAY AS NEEDED FOR SWELLING   glucose blood (ONETOUCH VERIO) test strip Use as instructed   imiquimod (ALDARA) 5 % cream Topical, 3 times weekly   insulin aspart (NOVOLOG FLEXPEN) 100 UNIT/ML FlexPen Max daily 35 units   Insulin Pen Needle 32G X 4 MM MISC 1 Device, Does not apply, 4 times daily   metoprolol succinate (TOPROL-XL) 25 MG 24 hr tablet TAKE 1 TABLET BY MOUTH EVERY DAY   Na Sulfate-K Sulfate-Mg Sulf (SUPREP BOWEL PREP KIT) 17.5-3.13-1.6 GM/177ML SOLN 1 kit, Oral, As directed   OneTouch Delica Lancets 60A MISC USE AS DIRECTED   rosuvastatin (CRESTOR) 40 mg, Oral, Daily   tacrolimus (PROTOPIC) 0.1 % ointment SMARTSIG:1 Topical Daily PRN   triamcinolone ointment (KENALOG) 0.1 % 1 application, Topical, 2 times daily   warfarin (COUMADIN) 7.5 MG tablet TAKE 1-2 TABLETS DAILY OR AS PRESCRIBED BY COUMADIN CLINIC     VITALS:  There were no vitals filed for this visit. Depression screen [facility] 2/9 03/09/2020 03/06/2018  Decreased Interest 0 0  Down, Depressed, Hopeless 0 0  PHQ - 2 Score 0 0  Altered sleeping - 0  Tired, decreased energy - 0  Change in appetite - 1  Feeling bad or failure about yourself  - 0  Trouble concentrating - 0  Moving slowly or fidgety/restless - 0  Suicidal thoughts - 0  PHQ-9 Score - 1  Some recent data  might be hidden    PHYSICAL EXAM   HEENT:  Normocephalic, atraumatic. The mucous membranes are moist. The superficial temporal arteries are without ropiness or tenderness. Cardiovascular: Regular rate and rhythm. Lungs: Clear to auscultation bilaterally. Neck: There are no carotid bruits noted bilaterally.  NEUROLOGICAL: No flowsheet data found. MMSE - Mini Mental State Exam 01/19/2016  Orientation to time 5  Orientation to Place 5  Registration 3  Attention/ Calculation 5  Recall 2  Language- name 2 objects 2  Language- repeat 1  Language- follow 3 step command 3  Language- read & follow direction 1  Write a sentence 1  Copy design 1  Total score 29    No flowsheet data found.   Orientation:  Alert and oriented to person, place and time. No aphasia or dysarthria. Fund of knowledge is  appropriate. Recent memory impaired and remote memory intact.  Attention reduced and concentration normal.  Able to name objects and repeat phrases. Delayed recall  5/5 Cranial nerves: There is good facial symmetry. Extraocular muscles are intact and visual fields are full to confrontational testing. Speech is fluent and clear. Soft palate rises symmetrically and there is no tongue deviation. Hearing is intact to conversational tone. Tone: Tone is good throughout. Sensation: Sensation is intact to light touch and pinprick throughout. Vibration is intact at the bilateral big toe.There is no extinction with double simultaneous stimulation. There is no sensory dermatomal level identified. Coordination: The patient has no difficulty with RAM's or FNF bilaterally. Normal finger to nose  Motor: Strength is 5/5 in the bilateral upper and lower extremities. There is no pronator drift. There are no fasciculations noted. DTR's: Deep tendon reflexes are 2/4 at the bilateral biceps, triceps, brachioradialis, patella and achilles.  Plantar responses are downgoing bilaterally. Gait and Station: The patient is able  to ambulate without difficulty.The patient is able to heel toe walk without any difficulty.The patient is able to ambulate in a tandem fashion. The patient is able to stand in the Romberg position.     Thank you for allowing [name] the opportunity to participate in the care of this nice patient. Please do not hesitate to contact us for any questions or concerns.   Total time spent on today's visit was 60 minutes, including both face-to-face time and nonface-to-face time.  Time included that spent on review of records (prior notes available to me/labs/imaging if pertinent), discussing treatment and goals, answering patient's questions and coordinating care.  Cc:  [name], NP  Sharene Butters 12/28/2020 7:15 AM

## 2020-12-31 ENCOUNTER — Ambulatory Visit: Payer: Medicare HMO | Admitting: Physician Assistant

## 2021-01-01 ENCOUNTER — Encounter: Payer: Self-pay | Admitting: Internal Medicine

## 2021-01-05 NOTE — Telephone Encounter (Signed)
Late entry.  Pt. Called and suggested his wife come with him for his Dexcom training.

## 2021-01-06 ENCOUNTER — Other Ambulatory Visit: Payer: Self-pay | Admitting: Family

## 2021-01-06 ENCOUNTER — Encounter: Payer: Self-pay | Admitting: Family

## 2021-01-06 DIAGNOSIS — E1159 Type 2 diabetes mellitus with other circulatory complications: Secondary | ICD-10-CM

## 2021-01-07 ENCOUNTER — Other Ambulatory Visit: Payer: Self-pay

## 2021-01-14 ENCOUNTER — Encounter: Payer: Self-pay | Admitting: Family

## 2021-01-16 ENCOUNTER — Other Ambulatory Visit: Payer: Self-pay | Admitting: Family

## 2021-01-18 ENCOUNTER — Telehealth: Payer: Self-pay

## 2021-01-18 ENCOUNTER — Encounter: Payer: Self-pay | Admitting: Family

## 2021-01-18 ENCOUNTER — Ambulatory Visit (INDEPENDENT_AMBULATORY_CARE_PROVIDER_SITE_OTHER): Payer: Medicare HMO | Admitting: Family

## 2021-01-18 VITALS — BP 135/71 | HR 60 | Temp 97.7°F | Resp 16 | Wt 218.0 lb

## 2021-01-18 DIAGNOSIS — M545 Low back pain, unspecified: Secondary | ICD-10-CM

## 2021-01-18 DIAGNOSIS — R29898 Other symptoms and signs involving the musculoskeletal system: Secondary | ICD-10-CM

## 2021-01-18 DIAGNOSIS — C4490 Unspecified malignant neoplasm of skin, unspecified: Secondary | ICD-10-CM | POA: Diagnosis not present

## 2021-01-18 HISTORY — DX: Other symptoms and signs involving the musculoskeletal system: R29.898

## 2021-01-18 NOTE — Assessment & Plan Note (Addendum)
He has significant muscle atrophy in his legs as well as weakness. He has had significant functional decline in his mobility.  I would like to order MRI of the lumbar spine and refer him to neurology for further evaluation.  I did give him an rx for a rolling walker and a raised toilet seat.  He is advised to go to the ER if worsening LE weakness or if he develops bowel/bladder incontinence.

## 2021-01-18 NOTE — Patient Instructions (Signed)
Wait to be contacted by neurology regarding consultation and MRI.

## 2021-01-18 NOTE — Telephone Encounter (Signed)
information about his mechanical heart valve. Needed for all radiological procedures.   Manufacturer: [employer]   Sebring #: [id number]  Model #: [id number]. Card states MRI compatible with 3 teslas or less.

## 2021-01-18 NOTE — Progress Notes (Addendum)
Subjective:   By signing my name below, I, [name], attest that this documentation has been prepared under the direction and in the presence of [name], NP, 01/18/2021     Patient ID: [name], male    DOB: Sep 25, 1945, 75 y.o.   MRN: [id number]  Chief Complaint  Patient presents with   Back Pain    Complains of lower back pain   Leg Pain    Complains of pain on both legs, more on the left    HPI Patient is in today for an office visit. His wife is present in the office with him today.   Skin cancer: He has some lesions on his arm that indicated skin cancer. He has an appointment this afternoon to have a MOHS procedure performed. They have not decided if chemotherapy is an option but a decision will be made today.  Back and leg pain: In September he was needing to use a wheel chair due to his leg pain. He notes that his legs simply felt heavy. He reports that for long periods of time of exertion his back pain would cause him to need rest which without the leg heaviness, he would need to use a walker but because of his legs he is needing a wheelchair. His wife notes that months ago she thinks the leg heaviness was due to leg weakness but now it is due to back pain as well. There is no concern with upper body muscle strength. He does have a history of a stroke. In order to rule out any other causes to his leg weakness he began injecting his Basaglar Kwikpen medication in his abdomen rather than his thigh. He has been working with another provider to cross any medications off of the list that could be a source of this issue and nothing has indicated being a cause. Recently he has had increased difficulty ambulating. Leg pain did not improve with discontinuation of statin medication.  Memory: He mentions that he has recently performed and passed a memory test with neurology.  Health Maintenance Due  Topic Date Due   Zoster Vaccines- Shingrix (1 of 2) Never done    COLONOSCOPY (Pts 45-70yr Insurance coverage will need to be confirmed)  10/06/2019   FOOT EXAM  01/14/2020    Past Medical History:  Diagnosis Date   Allergy    Aortic stenosis    CAD (coronary artery disease)    cabg   [place]   prostate   Erectile dysfunction    Heart murmur    Hemochromatosis 02/14/2011   Hemorrhoids    History of gout    History of hepatitis 01/31/1972   Hyperlipidemia    Hypertension    Hypertr obst cardiomyop    Mechanical heart valve present    Manufacturer: SSherlean Foot#: [id number] Model #: [id number] Card states MRI compatible with 3 teslas or less.   Obesity    moderate   Sensorineural hearing loss    Type II or unspecified type diabetes mellitus without mention of complication, not stated as uncontrolled     Past Surgical History:  Procedure Laterality Date   AORTIC VALVE REPLACEMENT  11/14/2003   [facility]   APPENDECTOMY  1990   CHOLECYSTECTOMY  1990   CORONARY ARTERY BYPASS GRAFT  10/2003   HERNIA REPAIR  1999   right, inguinal   HERNIA REPAIR  2002   left, inguinal   HIP SURGERY  2006  right hip   PILONIDAL CYST EXCISION  1964   prostate seed implant  3/12   TONSILLECTOMY  childhood    Family History  Problem Relation Age of Onset   Heart disease Mother    Stroke Mother    Heart disease Father    Asperger's syndrome Son    Hyperlipidemia Son    Coronary artery disease Brother    Cancer Neg Hx        negative for colon cancer    Social History   Socioeconomic History   Marital status: Married    Spouse name: Not on file   Number of children: Not on file   Years of education: Not on file   Highest education level: Not on file  Occupational History   Not on file  Tobacco Use   Smoking status: Light Smoker    Types: Cigars   Smokeless tobacco: Never  Substance and Sexual Activity   Alcohol use: Yes    Alcohol/week: 0.0 standard drinks    Comment: 4 glasses wine a month   Drug use: No    Sexual activity: Never  Other Topics Concern   Not on file  Social History Narrative   Right Handed    Lives in a one sotyr home. One step leads to front door.    Social Determinants of Health   Financial Resource Strain: Not on file  Food Insecurity: Not on file  Transportation Needs: Not on file  Physical Activity: Not on file  Stress: Not on file  Social Connections: Not on file  Intimate Partner Violence: Not on file    Outpatient Medications Prior to Visit  Medication Sig Dispense Refill   AMBULATORY NON FORMULARY MEDICATION cpap cushions            AirFit F20 (Size: Large)           AirFit F30 (Size: Med)   Please FAX the prescription to:  [phone] each prn   betamethasone dipropionate (DIPROLENE) 0.05 % cream Apply topically 2 (two) times daily as needed. To eczema rash 30 g 1   Blood Glucose Monitoring Suppl Supplies MISC Use for monitoring glucose level 100 each 1   colchicine 0.6 MG tablet TAKE 2 TABS BY MOUTH NOW AND THEN 1 TAB IN 1 HOUR FOR GOUT. (MAX 3 TABS/24 HRS) 9 tablet 1   Continuous Blood Gluc Sensor (DEXCOM G6 SENSOR) MISC 1 Device by Does not apply route as directed. 9 each 3   Continuous Blood Gluc Transmit (DEXCOM G6 TRANSMITTER) MISC 1 Device by Does not apply route as directed. 1 each 3   dapagliflozin propanediol (FARXIGA) 10 MG TABS tablet Take 1 tablet (10 mg total) by mouth daily before breakfast. 90 tablet 3   fenofibrate (TRICOR) 145 MG tablet TAKE 1 TABLET BY MOUTH EVERY DAY 90 tablet 0   furosemide (LASIX) 20 MG tablet TAKE 1 TABLET BY MOUTH EVERY DAY AS NEEDED FOR SWELLING 90 tablet 0   glucose blood (ONETOUCH VERIO) test strip Use as instructed 100 strip 12   imiquimod (ALDARA) 5 % cream Apply topically 3 (three) times a week.     insulin aspart (NOVOLOG FLEXPEN) 100 UNIT/ML FlexPen Max daily 35 units (Patient taking differently: Max daily 38 units 01-10-13) 30 mL 3   Insulin Glargine (BASAGLAR KWIKPEN) 100 UNIT/ML Inject 38 Units  into the skin daily. 30 mL 4   Insulin Pen Needle 32G X 4 MM MISC 1 Device by Does not apply route in  the morning, at noon, in the evening, and at bedtime. 400 each 3   metoprolol succinate (TOPROL-XL) 25 MG 24 hr tablet TAKE 1 TABLET BY MOUTH EVERY DAY 90 tablet 1   OneTouch Delica Lancets 07P MISC USE AS DIRECTED 100 each 1   rosuvastatin (CRESTOR) 40 MG tablet Take 1 tablet (40 mg total) by mouth daily. 90 tablet 3   tacrolimus (PROTOPIC) 0.1 % ointment SMARTSIG:1 Topical Daily PRN     triamcinolone ointment (KENALOG) 0.1 % Apply 1 application topically 2 (two) times daily.     warfarin (COUMADIN) 7.5 MG tablet TAKE 1-2 TABLETS DAILY OR AS PRESCRIBED BY COUMADIN CLINIC 180 tablet 0   Na Sulfate-K Sulfate-Mg Sulf (SUPREP BOWEL PREP KIT) 17.5-3.13-1.6 GM/177ML SOLN Take 1 kit by mouth as directed. (Patient not taking: Reported on 01/20/2021) 324 mL 0   No facility-administered medications prior to visit.    No Known Allergies  Review of Systems  Musculoskeletal:  Positive for back pain.       (+) leg pain bilaterally   Neurological:  Positive for tingling (mild numbness in legs), sensory change (numbness in legs) and weakness (in legs bilaterally).      Objective:    Physical Exam Constitutional:      General: He is not in acute distress.    Appearance: Normal appearance. He is not ill-appearing.  HENT:     Head: Normocephalic and atraumatic.     Right Ear: External ear normal.     Left Ear: External ear normal.  Eyes:     Extraocular Movements: Extraocular movements intact.     Pupils: Pupils are equal, round, and reactive to light.     Comments: (-) nystagmus  Cardiovascular:     Rate and Rhythm: Normal rate and regular rhythm.     Heart sounds: Normal heart sounds. No murmur heard.   No gallop.  Pulmonary:     Effort: Pulmonary effort is normal. No respiratory distress.     Breath sounds: Normal breath sounds. No wheezing or rales.  Musculoskeletal:     Comments: (+)  5/5 upper extremity strength (+) 4/5 left lower extremity strength (+) 4-5/5 right lower extremity strength (+) Cranial nerves intact  Skin:    General: Skin is warm and dry.  Neurological:     Mental Status: He is alert and oriented to person, place, and time.     Cranial Nerves: No cranial nerve deficit.     Deep Tendon Reflexes:     Reflex Scores:      Tricep reflexes are 1+ on the right side and 1+ on the left side.      Bicep reflexes are 1+ on the right side and 1+ on the left side.      Patellar reflexes are 1+ on the right side and 1+ on the left side.    Comments: (+) positive sensation to monofilament bilateral in shins   Psychiatric:        Behavior: Behavior normal.        Judgment: Judgment normal.    BP 135/71 (BP Location: Left Arm, Patient Position: Sitting, Cuff Size: Small)    Pulse 60    Temp 97.7 F (36.5 C) (Oral)    Resp 16    Wt 218 lb (98.9 kg)    SpO2 99%    BMI 31.73 kg/m  Wt Readings from Last 3 Encounters:  01/20/21 217 lb (98.4 kg)  01/18/21 218 lb (98.9 kg)  12/28/20 218 lb (98.9  kg)       Assessment & Plan:   Problem List Items Addressed This Visit       Unprioritized   Leg weakness, bilateral - Primary    He has significant muscle atrophy in his legs as well as weakness. He has had significant functional decline in his mobility.  I would like to order MRI of the lumbar spine and refer him to neurology for further evaluation.  I did give him an rx for a rolling walker and a raised toilet seat.  He is advised to go to the ER if worsening LE weakness or if he develops bowel/bladder incontinence.       Relevant Orders   Ambulatory referral to Neurology   Acute low back pain   Relevant Orders   MR Lumbar Spine Wo Contrast (Completed)   No orders of the defined types were placed in this encounter.   I, [name], NP, personally preformed the services described in this documentation.  All medical record entries made by the scribe were  at my direction and in my presence.  I have reviewed the chart and discharge instructions (if applicable) and agree that the record reflects my personal performance and is accurate and complete. 01/18/2021  I,[name],acting as a Education administrator for Nance Pear, NP.,have documented all relevant documentation on the behalf of Nance Pear, NP,as directed by  Nance Pear, NP while in the presence of Nance Pear, NP.  Nance Pear, NP

## 2021-01-19 ENCOUNTER — Other Ambulatory Visit: Payer: Self-pay

## 2021-01-19 ENCOUNTER — Ambulatory Visit: Payer: Medicare HMO

## 2021-01-19 DIAGNOSIS — Z7901 Long term (current) use of anticoagulants: Secondary | ICD-10-CM

## 2021-01-19 DIAGNOSIS — Z5181 Encounter for therapeutic drug level monitoring: Secondary | ICD-10-CM

## 2021-01-19 DIAGNOSIS — Z952 Presence of prosthetic heart valve: Secondary | ICD-10-CM | POA: Diagnosis not present

## 2021-01-19 DIAGNOSIS — I359 Nonrheumatic aortic valve disorder, unspecified: Secondary | ICD-10-CM

## 2021-01-19 DIAGNOSIS — I639 Cerebral infarction, unspecified: Secondary | ICD-10-CM

## 2021-01-19 LAB — POCT INR: INR: 1.4 — AB (ref 2.0–3.0)

## 2021-01-19 NOTE — Patient Instructions (Signed)
Take 2 tablets today only and 1 tablet Thursday and then Continue with 1 tablet each Monday, Wednesday and Friday, 1/2 tablet all other days.  Repeat INR in 2 weeks. [phone]

## 2021-01-20 ENCOUNTER — Ambulatory Visit: Payer: Medicare HMO | Admitting: Neurology

## 2021-01-20 ENCOUNTER — Other Ambulatory Visit (INDEPENDENT_AMBULATORY_CARE_PROVIDER_SITE_OTHER): Payer: Medicare HMO

## 2021-01-20 ENCOUNTER — Encounter: Payer: Self-pay | Admitting: Neurology

## 2021-01-20 VITALS — BP 140/69 | HR 65 | Ht 69.5 in | Wt 217.0 lb

## 2021-01-20 DIAGNOSIS — M21372 Foot drop, left foot: Secondary | ICD-10-CM | POA: Diagnosis not present

## 2021-01-20 DIAGNOSIS — G4733 Obstructive sleep apnea (adult) (pediatric): Secondary | ICD-10-CM | POA: Diagnosis not present

## 2021-01-20 DIAGNOSIS — R29898 Other symptoms and signs involving the musculoskeletal system: Secondary | ICD-10-CM

## 2021-01-20 LAB — SEDIMENTATION RATE: Sed Rate: 18 mm/hr (ref 0–20)

## 2021-01-20 LAB — CK: Total CK: 98 U/L (ref 7–232)

## 2021-01-20 LAB — C-REACTIVE PROTEIN: CRP: <1 mg/dL (ref 0.5–20.0)

## 2021-01-20 NOTE — Progress Notes (Signed)
[facility] Clinic Note - Initial Visit   Date: 01/20/21  [name] MRN: [id number] DOB: March 03, 1945   Dear David Alar, NP:  Thank you for your kind referral of [name] for consultation of bilateral leg weakness. Although his history is well known to you, please allow [name] to reiterate it for the purpose of our medical record. The patient was accompanied to the clinic by wife who also provides collateral information.     History of Present Illness: [name] is a 75 y.o. right-handed male with hypertension, hyperlipidemia, diabetes mellitus (HbA1c 9.2), stage 3 CKD, CAD s/p CABG, s/p AVR on coumadin, hemachromatosis, and history of prostate cancer presenting for evaluation of bilateral leg weakness. Patient is followed here by David Butters, PA-C for cognitive changes.  I am specifically asked to see the patient urgently for progressive leg weakness and atrophy.  Starting in September 2022, he began having pain involving the upper thighs, described as a pulling sensation.  During the summer, his cardiologist increased his statin therapy and because of these symptoms, it was held for about 4-6 weeks, but did not change his symptoms.  During Sept/Oct, he also also developed achy thigh pain and numbness in the lower legs.  Symptoms are all triggered by standing or walking.  He denies leg pain or heaviness at rest.  His symptoms were relatively stable, until four days ago when he acutely was unable to stand up from sitting position.  His wife had to use a rolling desk chair to be able to move him from one room to the other, because he was unable to stand or walk.  Over the past 3 days, his pain and heaviness has improved where he is able to walk with a walker.  He saw his PCP earlier this week who placed urgent referral here and has ordered MRI lumbar spine, scheduled for 12/31.  He has history of low back pain, especially last year when he was  making frequent long road trips.  His pain improved with PT.  His symptoms now are very different from his back pain he was previously experiencing.  No problems with bowel/bladder. No weakness in the arms or difficulty swallowing/talking.    Out-side paper records, electronic medical record, and images have been reviewed where available and summarized as:  CT head wo contrast 12/27/2020: No evidence of acute intracranial abnormality. Small chronic infarct within the left cerebellar hemisphere. Mild generalized cerebral and cerebellar atrophy. Paranasal sinus disease at the imaged levels, as described.  Lab Results  Component Value Date   HGBA1C 9.2 (A) 12/21/2020   Lab Results  Component Value Date   VITAMINB12 221 12/28/2020   Lab Results  Component Value Date   TSH 4.39 12/28/2020   No results found for: CKTOTAL, CKMB, CKMBINDEX, TROPONINI   Past Medical History:  Diagnosis Date   Allergy    Aortic stenosis    CAD (coronary artery disease)    cabg   Cancer ([place]) 01/30/2009   prostate   Erectile dysfunction    Heart murmur    Hemochromatosis 02/14/2011   Hemorrhoids    History of gout    History of hepatitis 01/31/1972   Hyperlipidemia    Hypertension    Hypertr obst cardiomyop    Mechanical heart valve present    Manufacturer: [employer] #: [id number]  Model #: [id number]. Card states MRI compatible with 3 teslas or less.   Obesity    moderate  Sensorineural hearing loss    Type II or unspecified type diabetes mellitus without mention of complication, not stated as uncontrolled     Past Surgical History:  Procedure Laterality Date   AORTIC VALVE REPLACEMENT  11/14/2003   [facility]   APPENDECTOMY  1990   CHOLECYSTECTOMY  1990   CORONARY ARTERY BYPASS GRAFT  10/2003   HERNIA REPAIR  1999   right, inguinal   HERNIA REPAIR  2002   left, inguinal   HIP SURGERY  2006   right hip   PILONIDAL CYST EXCISION  1964   prostate seed implant  3/12    TONSILLECTOMY  childhood     Medications:  Outpatient Encounter Medications as of 01/20/2021  Medication Sig   AMBULATORY NON FORMULARY MEDICATION cpap cushions            AirFit F20 (Size: Large)           AirFit F30 (Size: Med)   Please FAX the prescription to:  [phone]   betamethasone dipropionate (DIPROLENE) 0.05 % cream Apply topically 2 (two) times daily as needed. To eczema rash   Blood Glucose Monitoring Suppl Supplies MISC Use for monitoring glucose level   colchicine 0.6 MG tablet TAKE 2 TABS BY MOUTH NOW AND THEN 1 TAB IN 1 HOUR FOR GOUT. (MAX 3 TABS/24 HRS)   Continuous Blood Gluc Sensor (DEXCOM G6 SENSOR) MISC 1 Device by Does not apply route as directed.   Continuous Blood Gluc Transmit (DEXCOM G6 TRANSMITTER) MISC 1 Device by Does not apply route as directed.   dapagliflozin propanediol (FARXIGA) 10 MG TABS tablet Take 1 tablet (10 mg total) by mouth daily before breakfast.   fenofibrate (TRICOR) 145 MG tablet TAKE 1 TABLET BY MOUTH EVERY DAY   furosemide (LASIX) 20 MG tablet TAKE 1 TABLET BY MOUTH EVERY DAY AS NEEDED FOR SWELLING   glucose blood (ONETOUCH VERIO) test strip Use as instructed   imiquimod (ALDARA) 5 % cream Apply topically 3 (three) times a week.   insulin aspart (NOVOLOG FLEXPEN) 100 UNIT/ML FlexPen Max daily 35 units (Patient taking differently: Max daily 38 units 01-10-13)   Insulin Glargine (BASAGLAR KWIKPEN) 100 UNIT/ML Inject 38 Units into the skin daily.   Insulin Pen Needle 32G X 4 MM MISC 1 Device by Does not apply route in the morning, at noon, in the evening, and at bedtime.   metoprolol succinate (TOPROL-XL) 25 MG 24 hr tablet TAKE 1 TABLET BY MOUTH EVERY DAY   OneTouch Delica Lancets 37S MISC USE AS DIRECTED   rosuvastatin (CRESTOR) 40 MG tablet Take 1 tablet (40 mg total) by mouth daily.   tacrolimus (PROTOPIC) 0.1 % ointment SMARTSIG:1 Topical Daily PRN   triamcinolone ointment (KENALOG) 0.1 % Apply 1 application topically 2 (two)  times daily.   warfarin (COUMADIN) 7.5 MG tablet TAKE 1-2 TABLETS DAILY OR AS PRESCRIBED BY COUMADIN CLINIC   [DISCONTINUED] Na Sulfate-K Sulfate-Mg Sulf (SUPREP BOWEL PREP KIT) 17.5-3.13-1.6 GM/177ML SOLN Take 1 kit by mouth as directed. (Patient not taking: Reported on 01/20/2021)   No facility-administered encounter medications on file as of 01/20/2021.    Allergies: No Known Allergies  Family History: Family History  Problem Relation Age of Onset   Heart disease Mother    Stroke Mother    Heart disease Father    Asperger's syndrome Son    Hyperlipidemia Son    Coronary artery disease Brother    Cancer Neg Hx        negative  for colon cancer    Social History: Social History   Tobacco Use   Smoking status: Light Smoker    Types: Cigars   Smokeless tobacco: Never  Substance Use Topics   Alcohol use: Yes    Alcohol/week: 0.0 standard drinks    Comment: 4 glasses wine a month   Drug use: No   Social History   Social History Narrative   Right Handed    Lives in a one sotyr home. One step leads to front door.     Vital Signs:  BP 140/69    Pulse 65    Ht 5' 9.5" (1.765 m)    Wt 217 lb (98.4 kg)    SpO2 95%    BMI 31.59 kg/m    Neurological Exam: MENTAL STATUS including orientation to time, place, person, recent and remote memory, attention span and concentration, language, and fund of knowledge is normal.  Speech is not dysarthric.  CRANIAL NERVES: II:  No visual field defects.    III-IV-VI: Pupils equal round and reactive to light.  Normal conjugate, extra-ocular eye movements in all directions of gaze.  No nystagmus.  No ptosis.   V:  Normal facial sensation.    VII:  Normal facial symmetry and movements.   VIII:  Normal hearing and vestibular function.   IX-X:  Normal palatal movement.   XI:  Normal shoulder shrug and head rotation.   XII:  Normal tongue strength and range of motion, no deviation or fasciculation.  MOTOR:  Moderate quadriceps atrophy, no  fasciculations or abnormal movements.  No pronator drift.   Upper Extremity:  Right  Left  Deltoid  5/5   5/5   Biceps  5/5   5/5   Triceps  5/5   5/5   Infraspinatus 5/5  5/5  Medial pectoralis 5/5  5/5  Wrist extensors  5/5   5/5   Wrist flexors  5/5   5/5   Finger extensors  5/5   5/5   Finger flexors  5/5   5/5   Dorsal interossei  5/5   5/5   Abductor pollicis  5/5   5/5   Tone (Ashworth scale)  0  0   Lower Extremity:  Right  Left  Hip flexors  4/5   4/5   Hip extensors  5/5   5/5   Adductor 5-/5  5-/5  Abductor 5/5  5/5  Knee flexors  5/5   5/5   Knee extensors  5/5   5/5   Inversion 5/5  3/5  Eversion 5/5  3/5  Dorsiflexors  5-/5   3/5   Plantarflexors  5-/5   3/5   Toe extensors  4/5   2/5   Toe flexors  3/5   2/5   Tone (Ashworth scale)  0  0   MSRs:  Right        Left                  brachioradialis 2+  2+  biceps 2+  2+  triceps 2+  2+  patellar 0  0  ankle jerk 0  0  Hoffman no  no  plantar response down  down   SENSORY:  Vibration, pin prick, and temperature is reduced below the ankles bilaterally.  COORDINATION/GAIT: Normal finger-to- nose-finger.  Intact rapid alternating movements bilaterally. Gait is assisted with rigid walker and shows steppage gait on the left   IMPRESSION: Bilateral leg weakness which is worse proximally and  distally in the left foot, where there is foot drop.   Need to evaluate for spinal canal stenosis given his exertional weakness.  Although I would expect to see hyperreflexia, it is possible he may have overlapping radiculopathy/diabetic neuropathy masking his reflexes.    - MRI lumbar spine wo contrast scheduled for 12/31   - I will also screen him with myopathy with ESR, CRP, and CK.    - Fall precautions discussed, especially as he is on anticoagulation therapy and high risk for falls with his weakness and left foot drop  - If imaging does not show compressive pathology, NCS/EMG will be the next step   - Start vitamin  B12 1064mg daily  Further recommendations pending results.  Total time spent reviewing records, interview, history/exam, documentation, and coordination of care on day of encounter:  45 min  Thank you for allowing me to participate in patient's care.  If I can answer any additional questions, I would be pleased to do so.    Sincerely,    Xavier Fournier K. PPosey Pronto DO

## 2021-01-20 NOTE — Patient Instructions (Addendum)
Check labs  MRI lumbar spine has been scheduled for next week  Start vitamin B12 10108mcg daily

## 2021-01-21 DIAGNOSIS — E119 Type 2 diabetes mellitus without complications: Secondary | ICD-10-CM | POA: Diagnosis not present

## 2021-01-25 DIAGNOSIS — G4733 Obstructive sleep apnea (adult) (pediatric): Secondary | ICD-10-CM | POA: Diagnosis not present

## 2021-01-29 ENCOUNTER — Other Ambulatory Visit: Payer: Self-pay

## 2021-01-29 ENCOUNTER — Ambulatory Visit (HOSPITAL_BASED_OUTPATIENT_CLINIC_OR_DEPARTMENT_OTHER)
Admission: RE | Admit: 2021-01-29 | Discharge: 2021-01-29 | Disposition: A | Discharge status: Home / Self Care | Payer: Medicare HMO | Source: Ambulatory Visit | Attending: Family | Admitting: Family

## 2021-01-29 DIAGNOSIS — M545 Low back pain, unspecified: Secondary | ICD-10-CM | POA: Diagnosis not present

## 2021-01-29 DIAGNOSIS — M4316 Spondylolisthesis, lumbar region: Secondary | ICD-10-CM | POA: Diagnosis not present

## 2021-01-30 DIAGNOSIS — Z9181 History of falling: Secondary | ICD-10-CM

## 2021-01-30 HISTORY — DX: History of falling: Z91.81

## 2021-02-01 ENCOUNTER — Encounter: Payer: Self-pay | Admitting: Pharmacist Clinician (PhC)/ Clinical Pharmacy Specialist

## 2021-02-01 ENCOUNTER — Telehealth: Payer: Self-pay | Admitting: Family

## 2021-02-01 ENCOUNTER — Other Ambulatory Visit: Payer: Self-pay

## 2021-02-01 ENCOUNTER — Ambulatory Visit: Payer: Medicare HMO | Admitting: Pharmacist Clinician (PhC)/ Clinical Pharmacy Specialist

## 2021-02-01 ENCOUNTER — Telehealth: Payer: Self-pay | Admitting: Neurology

## 2021-02-01 DIAGNOSIS — I359 Nonrheumatic aortic valve disorder, unspecified: Secondary | ICD-10-CM | POA: Diagnosis not present

## 2021-02-01 DIAGNOSIS — Z952 Presence of prosthetic heart valve: Secondary | ICD-10-CM | POA: Diagnosis not present

## 2021-02-01 DIAGNOSIS — Z7901 Long term (current) use of anticoagulants: Secondary | ICD-10-CM

## 2021-02-01 DIAGNOSIS — M4800 Spinal stenosis, site unspecified: Secondary | ICD-10-CM

## 2021-02-01 LAB — POCT INR: INR: 3.3 — AB (ref 2.0–3.0)

## 2021-02-01 MED ORDER — ENOXAPARIN SODIUM 100 MG/ML IJ SOSY: 100.0000 mg | PREFILLED_SYRINGE | Freq: Two times a day (BID) | INTRAMUSCULAR | 1 refills | Status: DC

## 2021-02-01 NOTE — Telephone Encounter (Signed)
Results of MRI lumbar spine discussed with patient's wife which shows severe canal stenosis at L2-3 due to disc extrusion as well as disc bulge bilaterally at L4-5 and left L3-4.  Review of epic, shows that his PCP has initiated urgent referral to [facility] which is appreciated. If he develops new bowel/bladder dysfunction, worsening paresthesias, or weakness, he  was instructed to go to the ER for expedited evaluation.   MRI lumbar spine: 1. Disc bulge with central to right subarticular disc extrusion and facet hypertrophy at L2-3, resulting in severe canal and bilateral subarticular stenosis. 2. Right eccentric disc bulge with facet arthrosis at L4-5 with resultant moderate right worse than left lateral recess stenosis. 3. Left eccentric disc bulge with facet arthrosis at L3-4 with resultant mild to moderate left greater than right lateral recess stenosis, with moderate left L3 foraminal narrowing. 4. Right subarticular to foraminal disc protrusion at T12-L1 without significant stenosis or impingement.

## 2021-02-01 NOTE — Telephone Encounter (Signed)
Please advise pt that his MRI shows severe narrowing of his spinal cord due to pressure for the discs in his lower spine. This is cause for him to have leg weakness.  I am working to get him in with Neurosurgery ASAP.

## 2021-02-01 NOTE — Telephone Encounter (Signed)
Case was reviewed with Dr. Kristeen Miss (Neurosurgery) and he is going to try to work him in for an appointment tomorrow at his office. We discussed that he will need surgery and that he is on Coumadin. Will ask coumadin clinic to initiate Lovenox bridge pending upcoming surgery.   Spoke with wife. Advised her to be on lookout for call from coumadin clinic and neurosurgery office.

## 2021-02-01 NOTE — Patient Instructions (Addendum)
Jan 3 : No warfarin or enoxaparin (Lovenox)  Jan 4 : No warfarin or enoxaparin (Lovenox).  Jan 5: Inject enoxaparin100 mg in the fatty abdominal tissue at least 2 inches from the belly button twice a day about 12 hours apart, 8am and 8pm rotate sites. No warfarin.  Jan 6 : Inject enoxaparin in the fatty tissue every 12 hours, 8am and 8pm. No warfarin.  Jan 7 : Inject enoxaparin in the fatty tissue every 12 hours, 8am and 8pm. No warfarin.  Continue with twice daily injections until the day before procedure  Day before procedure: Inject enoxaparin in the fatty tissue in the morning at 8 am (No PM dose). No warfarin.     Procedure Day - No enoxaparin - No warfarin (unless okayed by surgeon)  Post procedure day 1: Resume enoxaparin inject in the fatty tissue every 12 hours and take warfarin 7.5 mg  Post procedure day 2: Inject enoxaparin in the fatty tissue every 12 hours and take warfarin 7.5 mg  Post procedure day 3: Inject enoxaparin in the fatty tissue every 12 hours and take warfarin 7.5 mg  Post procedure day 4: Inject enoxaparin in the fatty tissue every 12 hours and take warfarin 3.75 mg  Post procedure day 5: Inject enoxaparin in the fatty tissue every 12 hours and take warfarin 3.75 mg  Post procedure day 6: warfarin appt to check INR.

## 2021-02-01 NOTE — Telephone Encounter (Signed)
Per provider, she will communicate this information to patient herself.

## 2021-02-02 ENCOUNTER — Telehealth: Payer: Self-pay | Admitting: Family

## 2021-02-02 NOTE — Telephone Encounter (Signed)
Pt spouse called and would like to inform [name] that they've not heard from the neurosurgeon yet. Please advise.

## 2021-02-03 ENCOUNTER — Encounter (HOSPITAL_COMMUNITY): Payer: Self-pay | Admitting: Neurological Surgery

## 2021-02-03 ENCOUNTER — Encounter: Payer: Self-pay | Admitting: Pharmacist Clinician (PhC)/ Clinical Pharmacy Specialist

## 2021-02-03 ENCOUNTER — Other Ambulatory Visit: Payer: Self-pay

## 2021-02-03 ENCOUNTER — Encounter: Payer: Self-pay | Admitting: Family

## 2021-02-03 ENCOUNTER — Other Ambulatory Visit: Payer: Self-pay | Admitting: Neurological Surgery

## 2021-02-03 DIAGNOSIS — M5416 Radiculopathy, lumbar region: Secondary | ICD-10-CM | POA: Diagnosis not present

## 2021-02-03 DIAGNOSIS — M48062 Spinal stenosis, lumbar region with neurogenic claudication: Secondary | ICD-10-CM | POA: Diagnosis not present

## 2021-02-03 NOTE — Progress Notes (Signed)
[name] asked me to speak to his wife - [name], patient is extremely hoh.  [name] reports that [name] does not complain of chest pain or shortness of breath. Patient denies having any s/s of Covid in her household.  [name] denies any known exposure to Covid.   PCP is [name] Endocrinologist is Dr. Lind Guest. Cardiologist is Dr. Phill Mutter, this office order Coumadin to be held as of 01/31/21, Lovenox last dose 02/03/21 am.  [name] has type II diabetes, patient wears a Dexacom G6.  I instructed [name] to have [name] to take 14 units of Basaglar at hs, in am if CBG is > 220 take 1/2 of Humolog Insulin. I instructed patient to check CBG after awaking and every 2 hours until arrival  to the hospital.  I Instructed patient if CBG is less than 70 to take 4 Glucose Tablets or 1 tube of Glucose Gel or 1/2 cup of a clear juice. Recheck CBG in 15 minutes if CBG is not over 70 call, pre- op desk at [phone] for further instructions.   I asked  [name] to  have [name] to  shower with antibiotic soap, if it is available.  Dry off with a clean towel. Do not put lotion, powder, cologne or deodorant or makeup.No jewelry or piercings. Men may shave their face and neck. Woman should not shave. No nail polish, artificial or acrylic nails. Wear clean clothes, brush your teeth. Glasses, contact lens,dentures or partials may not be worn in the OR. If you need to wear them, please bring a case for glasses, do not wear contacts or bring a case, the hospital does not have contact cases, dentures or partials will have to be removed , make sure they are clean, we will provide a denture cup to put them in. You will need some one to drive you home and a responsible person over the age of 55 to stay with you for the first 24 hours after surgery.

## 2021-02-03 NOTE — Telephone Encounter (Signed)
[facility] called and stated Pt's referral is being reviewed by Dr. Carlean Jews to see if this is an urgent matter and will need to been as soon as possible. If he does not feel it is necessary, he will probably be seen February- March area.

## 2021-02-03 NOTE — Telephone Encounter (Signed)
Please call [facility] and check status of scheduling urgent appointment with Dr. Ellene Route.  I spoke to him on Tuesday and he was going to try to get the patient worked in yesterday.  Pt has not yet been contacted and this is very important.

## 2021-02-04 ENCOUNTER — Ambulatory Visit (HOSPITAL_COMMUNITY): Payer: Medicare HMO

## 2021-02-04 ENCOUNTER — Observation Stay (HOSPITAL_COMMUNITY)
Admission: RE | Admit: 2021-02-04 | Discharge: 2021-02-05 | Disposition: A | Discharge status: Home / Self Care | Payer: Medicare HMO | Attending: Neurosurgery | Admitting: Neurosurgery

## 2021-02-04 ENCOUNTER — Encounter (HOSPITAL_COMMUNITY)
Admission: RE | Disposition: A | Discharge status: Home / Self Care | Payer: Self-pay | Source: Home / Self Care | Attending: Neurological Surgery

## 2021-02-04 ENCOUNTER — Encounter: Payer: Self-pay | Admitting: Pharmacist Clinician (PhC)/ Clinical Pharmacy Specialist

## 2021-02-04 ENCOUNTER — Encounter: Payer: Self-pay | Admitting: Family

## 2021-02-04 ENCOUNTER — Encounter (HOSPITAL_COMMUNITY): Payer: Self-pay | Admitting: Neurological Surgery

## 2021-02-04 DIAGNOSIS — M48061 Spinal stenosis, lumbar region without neurogenic claudication: Secondary | ICD-10-CM | POA: Diagnosis not present

## 2021-02-04 DIAGNOSIS — M48062 Spinal stenosis, lumbar region with neurogenic claudication: Secondary | ICD-10-CM | POA: Diagnosis present

## 2021-02-04 DIAGNOSIS — Z7901 Long term (current) use of anticoagulants: Secondary | ICD-10-CM | POA: Insufficient documentation

## 2021-02-04 DIAGNOSIS — Z9889 Other specified postprocedural states: Secondary | ICD-10-CM

## 2021-02-04 DIAGNOSIS — Z952 Presence of prosthetic heart valve: Secondary | ICD-10-CM | POA: Insufficient documentation

## 2021-02-04 DIAGNOSIS — Z20822 Contact with and (suspected) exposure to covid-19: Secondary | ICD-10-CM | POA: Diagnosis not present

## 2021-02-04 DIAGNOSIS — M5126 Other intervertebral disc displacement, lumbar region: Secondary | ICD-10-CM | POA: Diagnosis not present

## 2021-02-04 DIAGNOSIS — Z794 Long term (current) use of insulin: Secondary | ICD-10-CM | POA: Diagnosis not present

## 2021-02-04 DIAGNOSIS — E119 Type 2 diabetes mellitus without complications: Secondary | ICD-10-CM | POA: Diagnosis not present

## 2021-02-04 DIAGNOSIS — I2510 Atherosclerotic heart disease of native coronary artery without angina pectoris: Secondary | ICD-10-CM | POA: Diagnosis not present

## 2021-02-04 DIAGNOSIS — Z01818 Encounter for other preprocedural examination: Secondary | ICD-10-CM

## 2021-02-04 DIAGNOSIS — I10 Essential (primary) hypertension: Secondary | ICD-10-CM | POA: Diagnosis not present

## 2021-02-04 DIAGNOSIS — Z419 Encounter for procedure for purposes other than remedying health state, unspecified: Secondary | ICD-10-CM

## 2021-02-04 DIAGNOSIS — M4800 Spinal stenosis, site unspecified: Secondary | ICD-10-CM | POA: Diagnosis present

## 2021-02-04 HISTORY — DX: Chronic kidney disease, unspecified: N18.9

## 2021-02-04 HISTORY — DX: Nausea with vomiting, unspecified: R11.2

## 2021-02-04 HISTORY — DX: Other specified postprocedural states: Z98.890

## 2021-02-04 HISTORY — DX: Other complications of anesthesia, initial encounter: T88.59XA

## 2021-02-04 HISTORY — PX: LUMBAR LAMINECTOMY/DECOMPRESSION MICRODISCECTOMY: SHX5026

## 2021-02-04 HISTORY — DX: Unspecified osteoarthritis, unspecified site: M19.90

## 2021-02-04 HISTORY — DX: Cerebral infarction, unspecified: I63.9

## 2021-02-04 HISTORY — DX: Sleep apnea, unspecified: G47.30

## 2021-02-04 HISTORY — DX: Spinal stenosis, lumbar region with neurogenic claudication: M48.062

## 2021-02-04 HISTORY — DX: Other specified postprocedural states: R11.2

## 2021-02-04 LAB — COMPREHENSIVE METABOLIC PANEL
ALT: 21 U/L (ref 0–44)
AST: 31 U/L (ref 15–41)
Albumin: 4.2 g/dL (ref 3.5–5.0)
Alkaline Phosphatase: 41 U/L (ref 38–126)
Anion gap: 10 (ref 5–15)
BUN: 30 mg/dL — ABNORMAL HIGH (ref 8–23)
CO2: 23 mmol/L (ref 22–32)
Calcium: 9.2 mg/dL (ref 8.9–10.3)
Chloride: 101 mmol/L (ref 98–111)
Creatinine, Ser: 1.15 mg/dL (ref 0.61–1.24)
GFR, Estimated: >60 mL/min (ref >60)
Glucose, Bld: 136 mg/dL — ABNORMAL HIGH (ref 70–99)
Potassium: 3.7 mmol/L (ref 3.5–5.1)
Sodium: 134 mmol/L — ABNORMAL LOW (ref 135–145)
Total Bilirubin: 0.7 mg/dL (ref 0.3–1.2)
Total Protein: 7.6 g/dL (ref 6.5–8.1)

## 2021-02-04 LAB — CBC
HCT: 46.7 % (ref 39.0–52.0)
Hemoglobin: 16 g/dL (ref 13.0–17.0)
MCH: 30 pg (ref 26.0–34.0)
MCHC: 34.3 g/dL (ref 30.0–36.0)
MCV: 87.5 fL (ref 80.0–100.0)
Platelets: 238 10*3/uL (ref 150–400)
RBC: 5.34 MIL/uL (ref 4.22–5.81)
RDW: 13.5 % (ref 11.5–15.5)
WBC: 8.5 10*3/uL (ref 4.0–10.5)
nRBC: 0 % (ref 0.0–0.2)

## 2021-02-04 LAB — SARS CORONAVIRUS 2 BY RT PCR (HOSPITAL ORDER, PERFORMED IN ~~LOC~~ HOSPITAL LAB): SARS Coronavirus 2: NEGATIVE

## 2021-02-04 LAB — PROTIME-INR
INR: 1.3 — ABNORMAL HIGH (ref 0.8–1.2)
Prothrombin Time: 16.4 seconds — ABNORMAL HIGH (ref 11.4–15.2)

## 2021-02-04 LAB — SURGICAL PCR SCREEN
MRSA, PCR: NEGATIVE
Staphylococcus aureus: POSITIVE — AB

## 2021-02-04 LAB — GLUCOSE, CAPILLARY
Glucose-Capillary: 150 mg/dL — ABNORMAL HIGH (ref 70–99)
Glucose-Capillary: 192 mg/dL — ABNORMAL HIGH (ref 70–99)

## 2021-02-04 LAB — APTT: aPTT: 27 seconds (ref 24–36)

## 2021-02-04 SURGERY — LUMBAR LAMINECTOMY/DECOMPRESSION MICRODISCECTOMY 1 LEVEL
Anesthesia: General | Site: Spine Lumbar | Laterality: Bilateral

## 2021-02-04 MED ORDER — CEFAZOLIN SODIUM-DEXTROSE 2-4 GM/100ML-% IV SOLN
2.0000 g | Freq: Three times a day (TID) | INTRAVENOUS | Status: DC
  Administered 2021-02-05: 2 g via INTRAVENOUS
  Filled 2021-02-04: qty 100

## 2021-02-04 MED ORDER — METOPROLOL TARTRATE 12.5 MG HALF TABLET: 25.0000 mg | ORAL_TABLET | Freq: Once | ORAL | Status: DC

## 2021-02-04 MED ORDER — SODIUM CHLORIDE 0.9 % IV SOLN: 250.0000 mL | INTRAVENOUS | Status: DC

## 2021-02-04 MED ORDER — ONDANSETRON HCL 4 MG/2ML IJ SOLN: 4.0000 mg | Freq: Four times a day (QID) | INTRAMUSCULAR | Status: DC | PRN

## 2021-02-04 MED ORDER — MORPHINE SULFATE (PF) 2 MG/ML IV SOLN: 2.0000 mg | INTRAVENOUS | Status: DC | PRN

## 2021-02-04 MED ORDER — MENTHOL 3 MG MT LOZG: 1.0000 | LOZENGE | OROMUCOSAL | Status: DC | PRN

## 2021-02-04 MED ORDER — SENNA 8.6 MG PO TABS
1.0000 | ORAL_TABLET | Freq: Two times a day (BID) | ORAL | Status: DC
  Administered 2021-02-04 – 2021-02-05 (×2): 8.6 mg via ORAL
  Filled 2021-02-04 (×2): qty 1

## 2021-02-04 MED ORDER — ROCURONIUM BROMIDE 10 MG/ML (PF) SYRINGE
PREFILLED_SYRINGE | INTRAVENOUS | Status: DC | PRN
  Administered 2021-02-04: 60 mg via INTRAVENOUS
  Administered 2021-02-04: 40 mg via INTRAVENOUS

## 2021-02-04 MED ORDER — FENTANYL CITRATE (PF) 250 MCG/5ML IJ SOLN
INTRAMUSCULAR | Status: AC
  Filled 2021-02-04: qty 5

## 2021-02-04 MED ORDER — FENTANYL CITRATE (PF) 250 MCG/5ML IJ SOLN
INTRAMUSCULAR | Status: DC | PRN
  Administered 2021-02-04 (×2): 50 ug via INTRAVENOUS
  Administered 2021-02-04 (×2): 25 ug via INTRAVENOUS
  Administered 2021-02-04: 100 ug via INTRAVENOUS

## 2021-02-04 MED ORDER — OXYCODONE-ACETAMINOPHEN 5-325 MG PO TABS
1.0000 | ORAL_TABLET | ORAL | Status: DC | PRN
  Administered 2021-02-05: 2 via ORAL
  Filled 2021-02-04: qty 2

## 2021-02-04 MED ORDER — PHENYLEPHRINE HCL (PRESSORS) 10 MG/ML IV SOLN
INTRAVENOUS | Status: AC
  Filled 2021-02-04: qty 1

## 2021-02-04 MED ORDER — ACETAMINOPHEN 650 MG RE SUPP: 650.0000 mg | RECTAL | Status: DC | PRN

## 2021-02-04 MED ORDER — ROSUVASTATIN CALCIUM 20 MG PO TABS
40.0000 mg | ORAL_TABLET | Freq: Every day | ORAL | Status: DC
  Administered 2021-02-05: 40 mg via ORAL
  Filled 2021-02-04: qty 2

## 2021-02-04 MED ORDER — BUPIVACAINE HCL (PF) 0.5 % IJ SOLN
INTRAMUSCULAR | Status: DC | PRN
  Administered 2021-02-04: 5 mL
  Administered 2021-02-04: 20 mL

## 2021-02-04 MED ORDER — THROMBIN 5000 UNITS EX SOLR: OROMUCOSAL | Status: DC | PRN

## 2021-02-04 MED ORDER — ONDANSETRON HCL 4 MG PO TABS: 4.0000 mg | ORAL_TABLET | Freq: Four times a day (QID) | ORAL | Status: DC | PRN

## 2021-02-04 MED ORDER — CHLORHEXIDINE GLUCONATE CLOTH 2 % EX PADS: 6.0000 | MEDICATED_PAD | Freq: Once | CUTANEOUS | Status: DC

## 2021-02-04 MED ORDER — LIDOCAINE-EPINEPHRINE 1 %-1:100000 IJ SOLN
INTRAMUSCULAR | Status: AC
  Filled 2021-02-04: qty 1

## 2021-02-04 MED ORDER — ACETAMINOPHEN 325 MG PO TABS: 650.0000 mg | ORAL_TABLET | ORAL | Status: DC | PRN

## 2021-02-04 MED ORDER — LIDOCAINE-EPINEPHRINE 1 %-1:100000 IJ SOLN
INTRAMUSCULAR | Status: DC | PRN
  Administered 2021-02-04: 5 mL

## 2021-02-04 MED ORDER — LACTATED RINGERS IV SOLN: INTRAVENOUS | Status: DC

## 2021-02-04 MED ORDER — ORAL CARE MOUTH RINSE: 15.0000 mL | Freq: Once | OROMUCOSAL | Status: AC

## 2021-02-04 MED ORDER — ENOXAPARIN SODIUM 100 MG/ML IJ SOSY
100.0000 mg | PREFILLED_SYRINGE | Freq: Two times a day (BID) | INTRAMUSCULAR | Status: DC
  Administered 2021-02-04 – 2021-02-05 (×2): 100 mg via SUBCUTANEOUS
  Filled 2021-02-04 (×2): qty 1

## 2021-02-04 MED ORDER — BISACODYL 10 MG RE SUPP: 10.0000 mg | Freq: Every day | RECTAL | Status: DC | PRN

## 2021-02-04 MED ORDER — OXYCODONE HCL 5 MG PO TABS: 5.0000 mg | ORAL_TABLET | Freq: Once | ORAL | Status: DC | PRN

## 2021-02-04 MED ORDER — METOPROLOL SUCCINATE ER 25 MG PO TB24
ORAL_TABLET | ORAL | Status: AC
  Administered 2021-02-04: 25 mg
  Filled 2021-02-04: qty 1

## 2021-02-04 MED ORDER — PHENYLEPHRINE HCL-NACL 20-0.9 MG/250ML-% IV SOLN
INTRAVENOUS | Status: DC | PRN
  Administered 2021-02-04: 30 ug/min via INTRAVENOUS

## 2021-02-04 MED ORDER — METHOCARBAMOL 1000 MG/10ML IJ SOLN
500.0000 mg | Freq: Four times a day (QID) | INTRAVENOUS | Status: DC | PRN
  Filled 2021-02-04: qty 5

## 2021-02-04 MED ORDER — PROPOFOL 10 MG/ML IV BOLUS
INTRAVENOUS | Status: AC
  Filled 2021-02-04: qty 20

## 2021-02-04 MED ORDER — ALUM & MAG HYDROXIDE-SIMETH 200-200-20 MG/5ML PO SUSP: 30.0000 mL | Freq: Four times a day (QID) | ORAL | Status: DC | PRN

## 2021-02-04 MED ORDER — SODIUM CHLORIDE 0.9% FLUSH: 3.0000 mL | Freq: Two times a day (BID) | INTRAVENOUS | Status: DC

## 2021-02-04 MED ORDER — POLYETHYLENE GLYCOL 3350 17 G PO PACK: 17.0000 g | PACK | Freq: Every day | ORAL | Status: DC | PRN

## 2021-02-04 MED ORDER — METOPROLOL SUCCINATE ER 25 MG PO TB24
25.0000 mg | ORAL_TABLET | Freq: Every day | ORAL | Status: DC
  Administered 2021-02-05: 25 mg via ORAL
  Filled 2021-02-04: qty 1

## 2021-02-04 MED ORDER — DAPAGLIFLOZIN PROPANEDIOL 10 MG PO TABS
10.0000 mg | ORAL_TABLET | Freq: Every day | ORAL | Status: DC
  Administered 2021-02-05: 10 mg via ORAL
  Filled 2021-02-04: qty 1

## 2021-02-04 MED ORDER — SODIUM CHLORIDE 0.9% FLUSH: 3.0000 mL | INTRAVENOUS | Status: DC | PRN

## 2021-02-04 MED ORDER — 0.9 % SODIUM CHLORIDE (POUR BTL) OPTIME
TOPICAL | Status: DC | PRN
  Administered 2021-02-04: 1000 mL

## 2021-02-04 MED ORDER — LIDOCAINE 2% (20 MG/ML) 5 ML SYRINGE
INTRAMUSCULAR | Status: DC | PRN
  Administered 2021-02-04: 60 mg via INTRAVENOUS

## 2021-02-04 MED ORDER — SUGAMMADEX SODIUM 200 MG/2ML IV SOLN
INTRAVENOUS | Status: DC | PRN
  Administered 2021-02-04: 200 mg via INTRAVENOUS

## 2021-02-04 MED ORDER — BUPIVACAINE HCL (PF) 0.5 % IJ SOLN
INTRAMUSCULAR | Status: AC
  Filled 2021-02-04: qty 30

## 2021-02-04 MED ORDER — PHENOL 1.4 % MT LIQD: 1.0000 | OROMUCOSAL | Status: DC | PRN

## 2021-02-04 MED ORDER — FENOFIBRATE 160 MG PO TABS
160.0000 mg | ORAL_TABLET | Freq: Every day | ORAL | Status: DC
  Administered 2021-02-05: 160 mg via ORAL
  Filled 2021-02-04: qty 1

## 2021-02-04 MED ORDER — DEXAMETHASONE SODIUM PHOSPHATE 10 MG/ML IJ SOLN
INTRAMUSCULAR | Status: DC | PRN
  Administered 2021-02-04: 10 mg via INTRAVENOUS

## 2021-02-04 MED ORDER — THROMBIN 5000 UNITS EX SOLR
CUTANEOUS | Status: AC
  Filled 2021-02-04: qty 5000

## 2021-02-04 MED ORDER — INSULIN ASPART 100 UNIT/ML IJ SOLN
0.0000 [IU] | Freq: Three times a day (TID) | INTRAMUSCULAR | Status: DC
  Administered 2021-02-05: 7 [IU] via SUBCUTANEOUS

## 2021-02-04 MED ORDER — DOCUSATE SODIUM 100 MG PO CAPS
100.0000 mg | ORAL_CAPSULE | Freq: Two times a day (BID) | ORAL | Status: DC
  Administered 2021-02-04 – 2021-02-05 (×2): 100 mg via ORAL
  Filled 2021-02-04 (×2): qty 1

## 2021-02-04 MED ORDER — FENTANYL CITRATE (PF) 100 MCG/2ML IJ SOLN: 25.0000 ug | INTRAMUSCULAR | Status: DC | PRN

## 2021-02-04 MED ORDER — ONDANSETRON HCL 4 MG/2ML IJ SOLN
INTRAMUSCULAR | Status: DC | PRN
  Administered 2021-02-04: 4 mg via INTRAVENOUS

## 2021-02-04 MED ORDER — CEFAZOLIN SODIUM-DEXTROSE 2-4 GM/100ML-% IV SOLN
2.0000 g | INTRAVENOUS | Status: AC
  Administered 2021-02-04: 2 g via INTRAVENOUS

## 2021-02-04 MED ORDER — METHOCARBAMOL 500 MG PO TABS
500.0000 mg | ORAL_TABLET | Freq: Four times a day (QID) | ORAL | Status: DC | PRN
  Administered 2021-02-05: 500 mg via ORAL
  Filled 2021-02-04: qty 1

## 2021-02-04 MED ORDER — CHLORHEXIDINE GLUCONATE 0.12 % MT SOLN
15.0000 mL | Freq: Once | OROMUCOSAL | Status: AC
  Administered 2021-02-04: 15 mL via OROMUCOSAL
  Filled 2021-02-04: qty 15

## 2021-02-04 MED ORDER — EPHEDRINE SULFATE-NACL 50-0.9 MG/10ML-% IV SOSY
PREFILLED_SYRINGE | INTRAVENOUS | Status: DC | PRN
  Administered 2021-02-04: 5 mg via INTRAVENOUS

## 2021-02-04 MED ORDER — OXYCODONE HCL 5 MG/5ML PO SOLN: 5.0000 mg | Freq: Once | ORAL | Status: DC | PRN

## 2021-02-04 MED ORDER — FLEET ENEMA 7-19 GM/118ML RE ENEM: 1.0000 | ENEMA | Freq: Once | RECTAL | Status: DC | PRN

## 2021-02-04 MED ORDER — PROPOFOL 10 MG/ML IV BOLUS
INTRAVENOUS | Status: DC | PRN
  Administered 2021-02-04: 130 mg via INTRAVENOUS

## 2021-02-04 MED ORDER — FUROSEMIDE 20 MG PO TABS
20.0000 mg | ORAL_TABLET | Freq: Every day | ORAL | Status: DC
  Administered 2021-02-05: 20 mg via ORAL
  Filled 2021-02-04: qty 1

## 2021-02-04 MED ORDER — TACROLIMUS 0.1 % EX OINT
1.0000 "application " | TOPICAL_OINTMENT | Freq: Every day | CUTANEOUS | Status: DC | PRN
  Filled 2021-02-04: qty 1

## 2021-02-04 SURGICAL SUPPLY — 42 items
ADH SKN CLS APL DERMABOND .7 (GAUZE/BANDAGES/DRESSINGS) ×1
BAG COUNTER SPONGE SURGICOUNT (BAG) ×2 IMPLANT
BAG SPNG CNTER NS LX DISP (BAG) ×1
BLADE CLIPPER SURG (BLADE) ×1 IMPLANT
BUR ACORN 6.0 (BURR) IMPLANT
BUR MATCHSTICK NEURO 3.0 LAGG (BURR) ×2 IMPLANT
CANISTER SUCT 3000ML PPV (MISCELLANEOUS) ×2 IMPLANT
DERMABOND ADVANCED (GAUZE/BANDAGES/DRESSINGS) ×1
DERMABOND ADVANCED .7 DNX12 (GAUZE/BANDAGES/DRESSINGS) ×1 IMPLANT
DEVICE DISSECT PLASMABLAD 3.0S (MISCELLANEOUS) ×1 IMPLANT
DRAPE HALF SHEET 40X57 (DRAPES) ×1 IMPLANT
DRAPE LAPAROTOMY T 102X78X121 (DRAPES) ×2 IMPLANT
DRSG OPSITE POSTOP 3X4 (GAUZE/BANDAGES/DRESSINGS) ×1 IMPLANT
DURAPREP 26ML APPLICATOR (WOUND CARE) ×2 IMPLANT
ELECT REM PT RETURN 9FT ADLT (ELECTROSURGICAL) ×2
ELECTRODE REM PT RTRN 9FT ADLT (ELECTROSURGICAL) ×1 IMPLANT
GAUZE 4X4 16PLY ~~LOC~~+RFID DBL (SPONGE) ×1 IMPLANT
GLOVE SURG LTX SZ8.5 (GLOVE) ×3 IMPLANT
GLOVE SURG UNDER POLY LF SZ8.5 (GLOVE) ×3 IMPLANT
GOWN STRL REUS W/ TWL LRG LVL3 (GOWN DISPOSABLE) IMPLANT
GOWN STRL REUS W/ TWL XL LVL3 (GOWN DISPOSABLE) IMPLANT
GOWN STRL REUS W/TWL 2XL LVL3 (GOWN DISPOSABLE) ×3 IMPLANT
GOWN STRL REUS W/TWL LRG LVL3 (GOWN DISPOSABLE)
GOWN STRL REUS W/TWL XL LVL3 (GOWN DISPOSABLE) ×4
KIT BASIN OR (CUSTOM PROCEDURE TRAY) ×2 IMPLANT
KIT TURNOVER KIT B (KITS) ×2 IMPLANT
NDL SPNL 20GX3.5 QUINCKE YW (NEEDLE) IMPLANT
NEEDLE HYPO 22GX1.5 SAFETY (NEEDLE) ×2 IMPLANT
NEEDLE SPNL 20GX3.5 QUINCKE YW (NEEDLE) ×2 IMPLANT
NS IRRIG 1000ML POUR BTL (IV SOLUTION) ×2 IMPLANT
PACK LAMINECTOMY NEURO (CUSTOM PROCEDURE TRAY) ×2 IMPLANT
PATTIES SURGICAL .5 X1 (DISPOSABLE) ×1 IMPLANT
PLASMABLADE 3.0S (MISCELLANEOUS) ×2
SPONGE SURGIFOAM ABS GEL SZ50 (HEMOSTASIS) ×2 IMPLANT
SUT VIC AB 1 CT1 18XBRD ANBCTR (SUTURE) ×1 IMPLANT
SUT VIC AB 1 CT1 8-18 (SUTURE) ×2
SUT VIC AB 2-0 CP2 18 (SUTURE) ×2 IMPLANT
SUT VIC AB 3-0 SH 8-18 (SUTURE) ×2 IMPLANT
SUT VIC AB 4-0 RB1 18 (SUTURE) ×2 IMPLANT
TOWEL GREEN STERILE (TOWEL DISPOSABLE) ×2 IMPLANT
TOWEL GREEN STERILE FF (TOWEL DISPOSABLE) ×2 IMPLANT
WATER STERILE IRR 1000ML POUR (IV SOLUTION) ×2 IMPLANT

## 2021-02-04 NOTE — Transfer of Care (Signed)
Immediate Anesthesia Transfer of Care Note  Patient: [name]  Procedure(s) Performed: Bilateral Lumbar Two-Three Laminectomy (Bilateral: Spine Lumbar)  Patient Location: PACU  Anesthesia Type:General  Level of Consciousness: awake, alert  and oriented  Airway & Oxygen Therapy: Patient Spontanous Breathing  Post-op Assessment: Report given to RN and Post -op Vital signs reviewed and stable  Post vital signs: Reviewed and stable  Last Vitals:  Vitals Value Taken Time  BP 166/90 02/04/21 2140  Temp 36.5 C 02/04/21 2140  Pulse 75 02/04/21 2150  Resp 12 02/04/21 2150  SpO2 96 % 02/04/21 2150  Vitals shown include unvalidated device data.  Last Pain: There were no vitals filed for this visit.       Complications: No notable events documented.

## 2021-02-04 NOTE — H&P (Signed)
CHIEF COMPLAINT: Difficulty and inability to walk any distances since December 18. HISTORY OF PRESENT ILLNESS: [name] is a 76 year old right-handed individual who notes that rather acutely on the 18th, he developed substantial weakness in both his lower extremities, such that standing and maintaining his balance and walking any distance became extremely difficult. He was seen by his primary care physician and an urgent MRI was ordered. This was performed on the 31st of December and the study demonstrates that [name] has a severe area of stenosis at the level of L2-L3. This is because of significant disc degeneration with a subligamentous herniation and marked hypertrophy of the facets at the level of L2-L3. I was contacted on the 2nd of December, noting that the patient was not able to ambulate very well without assistance. He was having great difficulty maintaining his balance. He had marked weakness in both tibialis anterior and quadriceps muscle. I am seeing him in the office today to discuss the findings on the MRI. Fortunately, he has not had any bowel or bladder problems. He notes that he has had some modest back pain on and off and this has not been a major issue of concern, but the walking and the weakness have been very problematic and seem to be getting worse despite the passage of time. PAST MEDICAL HISTORY: For the patient reveals that he has an artificial heart valve. He has been on Coumadin anticoagulation and when I was contacted, I advised that he stop the Coumadin and be bridged with Lovenox. This has been going on since his last dose of Coumadin was Monday evening. He has not had any Coumadin since. He also has a history of diabetes and is on insulin. He also has some hypercholesterolemia and is taking medication for that. His other medications include Metoprolol and Lasix. He is taking [place]. PHYSICAL EXAMINATION: On physical examination, I note that the  patient has significant difficulty standing into the erect position without using his arms to help him. He has marked weakness in the iliopsoas, graded at 3- out of 5. Significant weakness in the glutei graded at 3/5. Marked weakness in the quadriceps graded at 3/5. Tibialis anterior strength of 2/5 on the left, 3/5 on the right. Gastroc strength of 3 to 4/5 bilaterally. Absent reflexes in the patellae and the Achilles. Sensation appears intact to light touch in the lower extremities. Patient notes, however, that his legs in general have numbness feeling below the level of the knees. His upper extremity strength and reflexes appear to be intact. IMPRESSION: Review of the patient's MRI demonstrates that he has a high-grade stenosis at the level of L2-L3, but this is apart from moderate stenotic changes at L3-4 and L4-5 with some lateral recess stenosis. I have advised that because of the acuity of the weakness that he has experienced, he should undergo emergent surgical decompression of the L2-3 level. I discussed the surgical procedure with him and his wife, indicating that we would remove the laminar arch and undercut the inferior laminar arch, removing redundant thickened ligament and overgrown of facet material. If there are loose disc fragments, we would remove them too, but looking at the MRI, it suggest that this is a chronic disc herniation that has led to this degree of stenosis. A fusion is not being planned for this process. The surgery itself generally takes about an hour and a half to 2 hours and I would expect the patient to be in the hospital for the overnight, being  that he is going to be done later in the afternoon. Thereafter, depending on how his strength is, he may be able to go home on the following day. He would likely need some physical therapy and followup with time. After the surgery, he can restart his Coumadin the following day. We would not bridge his Lovenox but allow  him to resume the Coumadin anticoagulation. The concerns for the procedure itself include the possibility and risk of spinal fluid leakage, as the dura has to be manipulated in this situation. There is also the possibility of nerve root injury itself, but given the situation as it is, it is not likely to improve, given that he has felt some progression of this process since it started fairly acutely on the 18th of December. We will plan on scheduling the surgery for tomorrow afternoon.

## 2021-02-04 NOTE — Op Note (Signed)
Date of surgery: 02/04/2021 Preoperative diagnosis: Herniated nucleus pulposus L2-L3 with severe stenosis Postoperative diagnosis: Same Procedure: Bilateral laminotomies foraminotomies and discectomy L2-L3 Surgeon: Kristeen Miss Anesthesia: General endotracheal Indications: [name], [name] is a 75 year old male who had developed the sudden onset of severe pain and weakness in his lower extremities on December 18 he was found to have a severe degree of stenosis at the level of L2-L3 with a posterior disc protrusion.  He was advised regarding the need for surgery as he was markedly weak.  Procedure: The patient was brought to the operating room supine on the stretcher.  After the smooth induction of general endotracheal anesthesia, he was carefully turned prone.  The back was prepped with alcohol DuraPrep and draped in a sterile fashion.  The L2-L3 level was localized using a radiograph and a spinal needle placed into the interlaminar space.  Then a small vertical incision was made after infiltrating the area with 10 cc of lidocaine with 1% epinephrine mixed 50-50 with half percent Marcaine the dissection was carried down to the lumbodorsal fascia which was opened on the right side of midline and a subperiosteal dissection was performed in the L2-L3 interspace self-retaining retractor was placed in the wound and the second localizing radiograph was obtained after drilling the inferior margin lamina of L2.  This verified the space at L2-L3.  Then the dissection was continued by performing a laminotomy removing the inferior margin lamina of L2 out to the mesial facet.  Medial facetectomy was performed and the superior portion of the lamina of L3 was removed also this exposed the common dural tube and some significant epidural fat this was removed and the common dural tube was noted to be rather tightly squeezed under the ligament.  Ligament was opened and on the lateral aspect of the dura carefully dissected down  and identified several fragments of disc material in addition to some hypertrophied mesial facet material which was associated with some cystic material appearing to be consistent with a synovial cyst this was released from the dura and allowed mobilization of the dura.  As the dura was mobilized medially there were several fragments of disc that came into view just below the level of the disc space this was a large significant disc herniation and gradually by removing the disc material piecemeal was able to decompress the path of the exiting L3 nerve root on the right side a discectomy was then performed up to the level of the disc space the disc space itself could not be entered.  Once this area was decompressed the laminotomy was extended superiorly and inferiorly to decompress the path of the L2 nerve root superiorly and the L3 nerve root fully inferiorly and hemostasis was achieved here while we then performed a laminotomy and foraminotomies on the opposite side here I identified a more disc material that was herniated below the vertebral body of L3 again this was removed in a piecemeal fashion and allowed for good decompression of the central canal.  Then by checking both sides to make sure no residual disc material was left behind it was verified that there was a good decompression of the L2 nerve root superiorly the L3 nerve roots inferiorly.  Hemostasis in the laminotomy sites was obtained.  Then 20 cc of half percent Marcaine was injected into the paraspinous fascia and once all the retractors were removed another check for hemostasis was performed and the lumbodorsal fascia was closed with #1 Vicryl in interrupted fashion 2-0 Vicryl  was used in the subcutaneous tissues 3-0 Vicryl subcuticularly and Dermabond was placed on the skin along with some 4-0 subcuticular superficial closure layers.  Honeycomb dressing was placed over the skin.  The patient was then returned to recovery room in stable condition  blood loss was estimated at 100 cc.

## 2021-02-04 NOTE — Anesthesia Preprocedure Evaluation (Signed)
Anesthesia Evaluation  Patient identified by MRN, date of birth, ID band Patient awake    Reviewed: Allergy & Precautions, H&P , NPO status , Patient's Chart, lab work & pertinent test results  History of Anesthesia Complications (+) PONV and history of anesthetic complications  Airway Mallampati: II   Neck ROM: full    Dental   Pulmonary sleep apnea , Current Smoker,    breath sounds clear to auscultation       Cardiovascular hypertension, + CAD and + CABG  + Valvular Problems/Murmurs  Rhythm:regular Rate:Normal  S/p CABG and AVR (St Jude) 2005  TTE (09/2020): EF 65%, normal prosthetic AV function.   Neuro/Psych  Neuromuscular disease CVA    GI/Hepatic   Endo/Other  diabetes, Type 2  Renal/GU      Musculoskeletal  (+) Arthritis ,   Abdominal   Peds  Hematology   Anesthesia Other Findings   Reproductive/Obstetrics                             Anesthesia Physical Anesthesia Plan  ASA: 3  Anesthesia Plan: General   Post-op Pain Management:    Induction: Intravenous  PONV Risk Score and Plan: 2 and Ondansetron, Dexamethasone and Treatment may vary due to age or medical condition  Airway Management Planned: Oral ETT  Additional Equipment:   Intra-op Plan:   Post-operative Plan: Extubation in OR  Informed Consent: I have reviewed the patients History and Physical, chart, labs and discussed the procedure including the risks, benefits and alternatives for the proposed anesthesia with the patient or authorized representative who has indicated his/her understanding and acceptance.     Dental advisory given  Plan Discussed with: CRNA, Anesthesiologist and Surgeon  Anesthesia Plan Comments:         Anesthesia Quick Evaluation

## 2021-02-04 NOTE — Anesthesia Postprocedure Evaluation (Signed)
Anesthesia Post Note  Patient: [name]  Procedure(s) Performed: Bilateral Lumbar Two-Three Laminectomy (Bilateral: Spine Lumbar)     Patient location during evaluation: PACU Anesthesia Type: General Level of consciousness: awake and alert Pain management: pain level controlled Vital Signs Assessment: post-procedure vital signs reviewed and stable Respiratory status: spontaneous breathing, nonlabored ventilation and respiratory function stable Cardiovascular status: blood pressure returned to baseline and stable Postop Assessment: no apparent nausea or vomiting Anesthetic complications: no   No notable events documented.  Last Vitals:  Vitals:   02/04/21 2155 02/04/21 2210  BP: (!) 177/78   Pulse: 76   Resp: 13   Temp:  36.5 C  SpO2: 95%     Last Pain:  Vitals:   02/04/21 2210  PainSc: 0-No pain                 Yochanan Eddleman,W. EDMOND

## 2021-02-04 NOTE — Telephone Encounter (Signed)
Pt's wife stated they finally got an appointment with [facility] but as of today 02/04/21, they are out of network with their insurance. Spouse states the surgery would cost them 5,000 out of pocket and they don't know what to do. She would like to know if they need to cancel surgery and try an look at another surgeon. Please advice.

## 2021-02-04 NOTE — Anesthesia Procedure Notes (Addendum)
Procedure Name: Intubation Date/Time: 02/04/2021 7:24 PM Performed by: Griffin Dakin, CRNA Pre-anesthesia Checklist: Patient identified, Emergency Drugs available, Suction available and Patient being monitored Patient Re-evaluated:Patient Re-evaluated prior to induction Oxygen Delivery Method: Circle system utilized Preoxygenation: Pre-oxygenation with 100% oxygen Induction Type: IV induction Ventilation: Oral airway inserted - appropriate to patient size and Mask ventilation without difficulty Laryngoscope Size: Glidescope and 4 Grade View: Grade II Tube type: Oral Number of attempts: 1 Airway Equipment and Method: Rigid stylet and Video-laryngoscopy Placement Confirmation: ETT inserted through vocal cords under direct vision, positive ETCO2 and breath sounds checked- equal and bilateral Secured at: 22 cm Tube secured with: Tape Dental Injury: Teeth and Oropharynx as per pre-operative assessment  Comments: Grade IV view with DL mac, grade II view with glidescope and successful intubation

## 2021-02-05 ENCOUNTER — Other Ambulatory Visit (HOSPITAL_BASED_OUTPATIENT_CLINIC_OR_DEPARTMENT_OTHER): Payer: Medicare HMO

## 2021-02-05 ENCOUNTER — Encounter (HOSPITAL_COMMUNITY): Payer: Self-pay | Admitting: Neurological Surgery

## 2021-02-05 DIAGNOSIS — M5126 Other intervertebral disc displacement, lumbar region: Secondary | ICD-10-CM | POA: Diagnosis not present

## 2021-02-05 DIAGNOSIS — M4800 Spinal stenosis, site unspecified: Secondary | ICD-10-CM | POA: Diagnosis present

## 2021-02-05 DIAGNOSIS — M48062 Spinal stenosis, lumbar region with neurogenic claudication: Secondary | ICD-10-CM | POA: Diagnosis not present

## 2021-02-05 DIAGNOSIS — Z20822 Contact with and (suspected) exposure to covid-19: Secondary | ICD-10-CM | POA: Diagnosis not present

## 2021-02-05 DIAGNOSIS — E119 Type 2 diabetes mellitus without complications: Secondary | ICD-10-CM | POA: Diagnosis not present

## 2021-02-05 DIAGNOSIS — Z952 Presence of prosthetic heart valve: Secondary | ICD-10-CM | POA: Diagnosis not present

## 2021-02-05 DIAGNOSIS — Z7901 Long term (current) use of anticoagulants: Secondary | ICD-10-CM | POA: Diagnosis not present

## 2021-02-05 DIAGNOSIS — Z794 Long term (current) use of insulin: Secondary | ICD-10-CM | POA: Diagnosis not present

## 2021-02-05 LAB — GLUCOSE, CAPILLARY: Glucose-Capillary: 224 mg/dL — ABNORMAL HIGH (ref 70–99)

## 2021-02-05 MED ORDER — OXYCODONE-ACETAMINOPHEN 5-325 MG PO TABS: 1.0000 | ORAL_TABLET | ORAL | 0 refills | Status: DC | PRN

## 2021-02-05 MED ORDER — METHOCARBAMOL 500 MG PO TABS: 500.0000 mg | ORAL_TABLET | Freq: Four times a day (QID) | ORAL | 0 refills | Status: DC | PRN

## 2021-02-05 MED ORDER — DOCUSATE SODIUM 100 MG PO CAPS: 100.0000 mg | ORAL_CAPSULE | Freq: Two times a day (BID) | ORAL | 0 refills | Status: DC

## 2021-02-05 MED ORDER — CEPHALEXIN 500 MG PO CAPS
500.0000 mg | ORAL_CAPSULE | Freq: Once | ORAL | Status: AC
Start: 2021-02-05 — End: 2021-02-05
  Administered 2021-02-05: 500 mg via ORAL
  Filled 2021-02-05: qty 1

## 2021-02-05 NOTE — Evaluation (Signed)
Occupational Therapy Evaluation Patient Details Name: [name] MRN: [id number] DOB: [birth year] Today's Date: 02/05/2021   History of Present Illness Pt is a 76 y.o. male who presented 02/04/21 for bilateral laminotomies foraminotomies and discectomy L2-L3 secondary to herniated nucleus pulposus L2-L3 with severe stenosis causing weakness in bil lower extremities and difficulty with gait. PMH: aortic stenosis, arthritis, CAD, cancer, CKD, heart murmur, hemochromatosis, gout, hepatitis, HTN, CVA, DM2, sleep apnea   Clinical Impression   Patient admitted for the diagnosis above.  PTA he lives with his spouse, and has assist as needed.  Mild unsteadiness noted.  Currently he is close to baseline for ADL completion from a sit/stand level.  All precautions reviewed, and all questions answered.  No post acute OT is needed.        Recommendations for follow up therapy are one component of a multi-disciplinary discharge planning process, led by the attending physician.  Recommendations may be updated based on patient status, additional functional criteria and insurance authorization.   Follow Up Recommendations  No OT follow up    Assistance Recommended at Discharge Set up Supervision/Assistance  Patient can return home with the following      Functional Status Assessment  Patient has not had a recent decline in their functional status  Equipment Recommendations  None recommended by OT    Recommendations for Other Services       Precautions / Restrictions Precautions Precautions: Fall;Back Precaution Booklet Issued: Yes (comment) Precaution Comments: reviewed BLT precautions Required Braces or Orthoses:  (no brace needed) Restrictions Weight Bearing Restrictions: No      Mobility Bed Mobility                    Transfers Overall transfer level: Needs assistance Equipment used: Rolling walker (2 wheels) Transfers: Sit to/from Stand;Bed to chair/wheelchair/BSC Sit to  Stand: Supervision;Modified independent (Device/Increase time)           General transfer comment: cues for hand placement and limit twisting      Balance Overall balance assessment: Mild deficits observed, not formally tested                                         ADL either performed or assessed with clinical judgement   ADL Overall ADL's : At baseline                                             Vision Patient Visual Report: No change from baseline       Perception Perception Perception: Not tested   Praxis Praxis Praxis: Not tested    Pertinent Vitals/Pain Pain Assessment: Faces Pain Score: 3  Faces Pain Scale: Hurts a little bit Pain Location: back Pain Descriptors / Indicators: Discomfort;Operative site guarding Pain Intervention(s): Monitored during session     Hand Dominance Right   Extremity/Trunk Assessment Upper Extremity Assessment Upper Extremity Assessment: Overall WFL for tasks assessed   Lower Extremity Assessment Lower Extremity Assessment: Defer to PT evaluation   Cervical / Trunk Assessment Cervical / Trunk Assessment: Back Surgery   Communication Communication Communication: HOH   Cognition Arousal/Alertness: Awake/alert Behavior During Therapy: WFL for tasks assessed/performed Overall Cognitive Status: Within Functional Limits for tasks assessed  General Comments       Exercises     Shoulder Instructions      Home Living Family/patient expects to be discharged to:: Private residence Living Arrangements: Spouse/significant other Available Help at Discharge: Family;Available 24 hours/day Type of Home: House Home Access: Stairs to enter [employer] of Steps: 1 (short) Entrance Stairs-Rails: None Home Layout: One level     Bathroom Shower/Tub: Occupational psychologist: Standard     Home Equipment: Rollator (4  wheels);Shower seat;Grab bars - toilet;Grab bars - tub/shower          Prior Functioning/Environment Prior Level of Function : Independent/Modified Independent;Driving             Mobility Comments: Pt ambulated without an AD before onset of back issues. Recently, has had foot drop and falls due to back issues and has been using rollator. ADLs Comments: Retired        OT Problem List: Impaired balance (sitting and/or standing)      OT Treatment/Interventions:      OT Goals(Current goals can be found in the care plan section) Acute Rehab OT Goals Patient Stated Goal: Return home OT Goal Formulation: With patient Potential to Achieve Goals: Good  OT Frequency:      Co-evaluation              AM-PAC OT "6 Clicks" Daily Activity     Outcome Measure Help from another person eating meals?: None Help from another person taking care of personal grooming?: None Help from another person toileting, which includes using toliet, bedpan, or urinal?: None Help from another person bathing (including washing, rinsing, drying)?: None Help from another person to put on and taking off regular upper body clothing?: None Help from another person to put on and taking off regular lower body clothing?: A Little 6 Click Score: 23   End of Session Equipment Utilized During Treatment: Rolling walker (2 wheels)  Activity Tolerance: Patient tolerated treatment well Patient left: with call bell/phone within reach;in chair;with family/visitor present  OT Visit Diagnosis: Unsteadiness on feet (R26.81)                Time: 6808-8110 OT Time Calculation (min): 17 min Charges:  OT General Charges $OT Visit: 1 Visit OT Evaluation $OT Eval Moderate Complexity: 1 Mod  02/05/2021  RP, OTR/L  Acute Rehabilitation Services  Office:  [phone]   Metta Clines 02/05/2021, 9:16 AM

## 2021-02-05 NOTE — Discharge Summary (Signed)
Physician Discharge Summary     Providing Compassionate, Quality Care - Together   Patient ID: [name] MRN: [id number] DOB/AGE: [birth year] 76 y.o.  Admit date: 02/04/2021 Discharge date: 02/05/2021  Admission Diagnoses: Lumbar stenosis with neurogenic claudication  Discharge Diagnoses:  Principal Problem:   S/P lumbar laminectomy Active Problems:   Lumbar stenosis with neurogenic claudication   Discharged Condition: good  Hospital Course: Patient underwent a bilateral laminotomies, foraminotomies, and discectomy at L2-L3 by Dr. Ellene Route on 02/04/2021.  He was admitted to 3C09 following recovery from anesthesia in the PACU.  His postoperative course has been uncomplicated.  He has worked with both physical and occupational therapies who feel the patient is ready for discharge home.  He is ambulating independently and without difficulty.  He is tolerating a normal diet.  He is not having any bowel or bladder dysfunction.  His pain is well-controlled with oral pain medication.  He is ready for discharge home.   Consults: rehabilitation medicine  Significant Diagnostic Studies: radiology: DG Lumbar Spine 2-3 Views  Result Date: 02/04/2021 CLINICAL DATA:  Bilateral Lumbar two-three laminectomy opening procedure EXAM: LUMBAR SPINE - 2-3 VIEW COMPARISON:  None. FINDINGS: Intraoperative first image cross-table lateral radiograph of the spine with surgical hardware linear metallic density overlying the L3 spinous process with tip at the level of the L2-L3 intra-articular facet joint. Intraoperative second image cross-table lateral radiograph of the spine with surgical hardware metallic density just posterior to the inferior articular facet of the L2 vertebral body. Surgical clips overlie the abdomen. IMPRESSION: Intraoperative cross-table lateral radiograph of the lumbar spine. Electronically Signed   By: Iven Finn M.D.   On: 02/04/2021 21:49     Treatments: surgery:  Bilateral  laminotomies foraminotomies and discectomy L2-L3  Discharge Exam: Blood pressure (!) 161/77, pulse 69, temperature 98.2 F (36.8 C), temperature source Oral, resp. rate 18, height 5\' 9"  (1.753 m), weight 98.9 kg, SpO2 98 %.  Alert and oriented x 4 PERRLA CN II-XII grossly intact MAE, Strength and sensation intact Incision is covered with Honeycomb dressing and Steri Strips; Dressing is clean, dry, and intact   Disposition: Discharge disposition: 01-Home or Self Care       Discharge Instructions     Incentive spirometry RT   Complete by: As directed       Allergies as of 02/05/2021   No Known Allergies      Medication List     STOP taking these medications    enoxaparin 100 MG/ML injection Commonly known as: LOVENOX       TAKE these medications    AMBULATORY NON FORMULARY MEDICATION cpap cushions            AirFit F20 (Size: Large)           AirFit F30 (Size: Med)   Please FAX the prescription to:  [phone]   Basaglar KwikPen 100 UNIT/ML Inject 38 Units into the skin daily. What changed:  how much to take additional instructions   betamethasone dipropionate 0.05 % cream Apply topically 2 (two) times daily as needed. To eczema rash   Blood Glucose Monitoring Suppl Supplies Misc Use for monitoring glucose level   colchicine 0.6 MG tablet TAKE 2 TABS BY MOUTH NOW AND THEN 1 TAB IN 1 HOUR FOR GOUT. (MAX 3 TABS/24 HRS) What changed:  how much to take how to take this when to take this reasons to take this additional instructions   dapagliflozin propanediol 10 MG Tabs tablet Commonly known as:  Farxiga Take 1 tablet (10 mg total) by mouth daily before breakfast.   Dexcom G6 Sensor Misc 1 Device by Does not apply route as directed.   Dexcom G6 Transmitter Misc 1 Device by Does not apply route as directed.   docusate sodium 100 MG capsule Commonly known as: COLACE Take 1 capsule (100 mg total) by mouth 2 (two) times daily.    fenofibrate 145 MG tablet Commonly known as: TRICOR TAKE 1 TABLET BY MOUTH EVERY DAY   furosemide 20 MG tablet Commonly known as: LASIX TAKE 1 TABLET BY MOUTH EVERY DAY AS NEEDED FOR SWELLING What changed: See the new instructions.   Insulin Pen Needle 32G X 4 MM Misc 1 Device by Does not apply route in the morning, at noon, in the evening, and at bedtime.   methocarbamol 500 MG tablet Commonly known as: ROBAXIN Take 1 tablet (500 mg total) by mouth every 6 (six) hours as needed for muscle spasms.   metoprolol succinate 25 MG 24 hr tablet Commonly known as: TOPROL-XL TAKE 1 TABLET BY MOUTH EVERY DAY   NovoLOG FlexPen 100 UNIT/ML FlexPen Generic drug: insulin aspart Max daily 35 units What changed:  how much to take how to take this when to take this additional instructions   OneTouch Delica Lancets 26R Misc USE AS DIRECTED   OneTouch Verio test strip Generic drug: glucose blood Use as instructed   oxyCODONE-acetaminophen 5-325 MG tablet Commonly known as: PERCOCET/ROXICET Take 1-2 tablets by mouth every 4 (four) hours as needed for moderate pain or severe pain.   rosuvastatin 40 MG tablet Commonly known as: CRESTOR Take 1 tablet (40 mg total) by mouth daily. What changed: when to take this   tacrolimus 0.1 % ointment Commonly known as: PROTOPIC Apply 1 application topically daily as needed (irritation).   triamcinolone ointment 0.1 % Commonly known as: KENALOG Apply 1 application topically 2 (two) times daily.   warfarin 7.5 MG tablet Commonly known as: COUMADIN Take as directed. If you are unsure how to take this medication, talk to your nurse or doctor. Original instructions: TAKE 1-2 TABLETS DAILY OR AS PRESCRIBED BY COUMADIN CLINIC        Follow-up Information     Kristeen Miss, MD. Schedule an appointment as soon as possible for a visit in 2 week(s).   Specialty: Neurosurgery Contact information: [address] [place]  [address] [phone]                 Signed: Viona Gilmore, DNP, AGNP-C Nurse Practitioner  [facility] [place] [address], [address], [place], [place] [address] P: [phone]     F: [phone]  02/05/2021, 8:41 AM

## 2021-02-05 NOTE — Care Management (Signed)
Transition of Care Department [facility]) has reviewed patient and no TOC needs have been identified at this time. DME needs will be fulfilled by unit staff

## 2021-02-05 NOTE — Care Management Obs Status (Signed)
[facility] NOTIFICATION   Patient Details  Name: [name] MRN: [id number] Date of Birth: [birth year]   Medicare Observation Status Notification Given:  Yes    Tom-Johnson, Renea Ee, RN 02/05/2021, 9:34 AM

## 2021-02-05 NOTE — Progress Notes (Signed)
Patient's inpatient admit status changed to Outpatient. Letter done, read and given to patient at bedside with understanding verbalized.

## 2021-02-05 NOTE — Care Management CC44 (Signed)
Condition Code 44 Documentation Completed  Patient Details  Name: COSTANTINO KOHLBECK MRN: [id number] Date of Birth: 1945/05/12   Condition Code 44 given:  Yes Patient signature on Condition Code 44 notice:  Yes Documentation of 2 MD's agreement:  Yes Code 44 added to claim:  Yes    Tom-Johnson, Renea Ee, RN 02/05/2021, 9:34 AM

## 2021-02-05 NOTE — Discharge Instructions (Signed)
Wound Care Keep incision covered and dry for two days.    Do not put any creams, lotions, or ointments on incision. Leave steri-strips on back.  They will fall off by themselves.  Activity Walk each and every day, increasing distance each day. No lifting greater than 5 lbs.  Avoid excessive back motion. No driving for 2 weeks; may ride as a passenger locally.  Diet Resume your normal diet.   Return to Work Will be discussed at your follow up appointment.  Call Your Doctor If Any of These Occur Redness, drainage, or swelling at the wound.  Temperature greater than 101 degrees. Severe pain not relieved by pain medication. Incision starts to come apart.  Follow Up Appt Call today for appointment in 1-2 weeks [phone]) or for problems.

## 2021-02-05 NOTE — Evaluation (Signed)
Physical Therapy Evaluation Patient Details Name: [name] MRN: [id number] DOB: Jun 09, 1945 Today's Date: 02/05/2021  History of Present Illness  Pt is a 76 y.o. male who presented 02/04/21 for bilateral laminotomies foraminotomies and discectomy L2-L3 secondary to herniated nucleus pulposus L2-L3 with severe stenosis causing weakness in bil lower extremities and difficulty with gait. PMH: aortic stenosis, arthritis, CAD, cancer, CKD, heart murmur, hemochromatosis, gout, hepatitis, HTN, CVA, DM2, sleep apnea   Clinical Impression  Pt presents with condition above and deficits mentioned below, see PT Problem List. Prior to onset of his back issues, he was independent without an AD, living with his wife in a 1-level house with 1 short STE. Since the onset of his back issues, he has had foot drop, falls, and has been using rollator. Currently, pt displays deficits in bil lower extremity strength, more so functionally in his L anterior tibialis causing decreased foot clearance during gait. In addition, he displays deficits in bil feet sensation and balance, placing him at risk for falls. He is currently functioning with a RW at a min guard-supervision level without LOB. Educated pt on back precautions and car transfers. He would likely benefit from follow-up with Outpatient PT to maximize his return to baseline without an AD ideally. Will continue to follow acutely.     Recommendations for follow up therapy are one component of a multi-disciplinary discharge planning process, led by the attending physician.  Recommendations may be updated based on patient status, additional functional criteria and insurance authorization.  Follow Up Recommendations Outpatient PT    Assistance Recommended at Discharge Set up Supervision/Assistance  Patient can return home with the following  Help with stairs or ramp for entrance    Equipment Recommendations None recommended by PT  Recommendations for Other  Services       Functional Status Assessment Patient has had a recent decline in their functional status and demonstrates the ability to make significant improvements in function in a reasonable and predictable amount of time.     Precautions / Restrictions Precautions Precautions: Fall;Back Precaution Booklet Issued: Yes (comment) Precaution Comments: reviewed BLT precautions Required Braces or Orthoses:  (no brace needed) Restrictions Weight Bearing Restrictions: No      Mobility  Bed Mobility Overal bed mobility: Needs Assistance Bed Mobility: Rolling;Sidelying to Sit Rolling: Min guard Sidelying to sit: Min guard;HOB elevated       General bed mobility comments: Cues provided for log roll technique and to transition to sit EOB, min guard for safety and compliance of precautions.    Transfers Overall transfer level: Needs assistance Equipment used: Rolling walker (2 wheels) Transfers: Sit to/from Stand Sit to Stand: Min guard           General transfer comment: Extra time to power up to stand, min guard for safety, no LOB.    Ambulation/Gait Ambulation/Gait assistance: Min guard Gait Distance (Feet): 468 Feet Assistive device: Rolling walker (2 wheels) Gait Pattern/deviations: Step-through pattern;Decreased dorsiflexion - left;Trunk flexed Gait velocity: reduced Gait velocity interpretation: <1.8 ft/sec, indicate of risk for recurrent falls   General Gait Details: Pt with slowed gait and slightly flexed trunk, likely due to pain following surgery. Pt with decreased L foot clearance, educated him to ensure clearance with obstacles for safety purposes.  Stairs            Wheelchair Mobility    Modified Rankin (Stroke Patients Only)       Balance Overall balance assessment: Needs assistance Sitting-balance support: No upper extremity  supported;Feet supported Sitting balance-Leahy Scale: Good     Standing balance support: Reliant on assistive  device for balance Standing balance-Leahy Scale: Poor Standing balance comment: Reliant on RW                             Pertinent Vitals/Pain Pain Assessment: 0-10 Pain Score: 3  Faces Pain Scale: Hurts a little bit Pain Location: back Pain Descriptors / Indicators: Discomfort;Operative site guarding Pain Intervention(s): Limited activity within patient's tolerance;Monitored during session;Repositioned;Premedicated before session    Home Living Family/patient expects to be discharged to:: Private residence Living Arrangements: Spouse/significant other Available Help at Discharge: Family;Available 24 hours/day Type of Home: House Home Access: Stairs to enter Entrance Stairs-Rails: None Entrance Stairs-Number of Steps: 1 (short)   Home Layout: One level Home Equipment: Rollator (4 wheels);Shower seat;Grab bars - toilet;Grab bars - tub/shower      Prior Function Prior Level of Function : Independent/Modified Independent;Driving             Mobility Comments: Pt ambulated without an AD before onset of back issues. Recently, has had foot drop and falls due to back issues and has been using rollator. ADLs Comments: Retired     Journalist, newspaper   Dominant Hand: Right    Extremity/Trunk Assessment   Upper Extremity Assessment Upper Extremity Assessment: Defer to OT evaluation    Lower Extremity Assessment Lower Extremity Assessment: RLE deficits/detail;LLE deficits/detail RLE Deficits / Details: MMT scores of 4 quads, 4- anterior tibialis; slight numbness bil feet, but improved since surgery RLE Sensation: decreased light touch LLE Deficits / Details: MMT scores of 5 quads, 3+ anterior tibialis; slight numbness bil feet, but improved since surgery LLE Sensation: decreased light touch    Cervical / Trunk Assessment Cervical / Trunk Assessment: Back Surgery  Communication   Communication: HOH (hears better out of R ear)  Cognition Arousal/Alertness:  Awake/alert Behavior During Therapy: WFL for tasks assessed/performed Overall Cognitive Status: Within Functional Limits for tasks assessed                                          General Comments General comments (skin integrity, edema, etc.): Educated pt on car transfers    Exercises     Assessment/Plan    PT Assessment Patient needs continued PT services  PT Problem List Decreased strength;Decreased activity tolerance;Decreased balance;Decreased mobility;Decreased knowledge of precautions;Impaired sensation;Pain       PT Treatment Interventions DME instruction;Gait training;Functional mobility training;Stair training;Therapeutic activities;Therapeutic exercise;Balance training;Neuromuscular re-education;Patient/family education    PT Goals (Current goals can be found in the Care Plan section)  Acute Rehab PT Goals Patient Stated Goal: to get better PT Goal Formulation: With patient/family Time For Goal Achievement: 02/12/21 Potential to Achieve Goals: Good    Frequency Min 5X/week     Co-evaluation               AM-PAC PT "6 Clicks" Mobility  Outcome Measure Help needed turning from your back to your side while in a flat bed without using bedrails?: A Little Help needed moving from lying on your back to sitting on the side of a flat bed without using bedrails?: A Little Help needed moving to and from a bed to a chair (including a wheelchair)?: A Little Help needed standing up from a chair using your arms (e.g., wheelchair or bedside  chair)?: A Little Help needed to walk in hospital room?: A Little Help needed climbing 3-5 steps with a railing? : A Little 6 Click Score: 18    End of Session Equipment Utilized During Treatment: Gait belt Activity Tolerance: Patient tolerated treatment well Patient left: Other (comment) (with OT in hall) Nurse Communication: Mobility status PT Visit Diagnosis: Unsteadiness on feet (R26.81);Other abnormalities  of gait and mobility (R26.89);Muscle weakness (generalized) (M62.81);Difficulty in walking, not elsewhere classified (R26.2);History of falling (Z91.81);Pain Pain - part of body:  (back)    Time: 8208-1388 PT Time Calculation (min) (ACUTE ONLY): 22 min   Charges:   PT Evaluation $PT Eval Moderate Complexity: 1 Mod          Moishe Spice, PT, DPT Acute Rehabilitation Services  Pager: [phone] Office: [phone]   Orvan Falconer 02/05/2021, 9:42 AM

## 2021-02-05 NOTE — Progress Notes (Signed)
Patient is discharged from room 3C09 at this time. Alert and in stable condition. IV site d/c'd and instructions read to patient, wife and daughter with understanding verbalized and all questions answered. Left unit via wheelchair with all belongings at side.

## 2021-02-07 ENCOUNTER — Other Ambulatory Visit: Payer: Self-pay

## 2021-02-07 DIAGNOSIS — Z952 Presence of prosthetic heart valve: Secondary | ICD-10-CM

## 2021-02-10 ENCOUNTER — Ambulatory Visit: Payer: Medicare HMO

## 2021-02-10 ENCOUNTER — Other Ambulatory Visit: Payer: Self-pay

## 2021-02-10 DIAGNOSIS — Z7901 Long term (current) use of anticoagulants: Secondary | ICD-10-CM | POA: Diagnosis not present

## 2021-02-10 DIAGNOSIS — Z952 Presence of prosthetic heart valve: Secondary | ICD-10-CM | POA: Diagnosis not present

## 2021-02-10 LAB — POCT INR: INR: 1.8 — AB (ref 2.0–3.0)

## 2021-02-10 NOTE — Patient Instructions (Signed)
Lovenox Injection tonight only.  TAKE 1 TABLET TONIGHT ONLY and then continue 0.5 tablet Daily, except 1 tablet Monday, Wednesday and Friday.  INR 2 weeks.

## 2021-02-15 ENCOUNTER — Ambulatory Visit: Payer: Medicare HMO | Admitting: Family

## 2021-02-18 ENCOUNTER — Telehealth: Payer: Self-pay | Admitting: Family

## 2021-02-18 NOTE — Telephone Encounter (Signed)
Form taken to the back and placed in provider's folder

## 2021-02-18 NOTE — Telephone Encounter (Signed)
[name] from [place] dropped off document to be filled out by provider (Physicians orders for [facility] oxygen 2  pages) Document when ready to please send to fax at [phone]. Document put at front office tray under providers name.

## 2021-02-20 DIAGNOSIS — E119 Type 2 diabetes mellitus without complications: Secondary | ICD-10-CM | POA: Diagnosis not present

## 2021-02-22 ENCOUNTER — Telehealth: Payer: Self-pay | Admitting: *Deleted

## 2021-02-22 DIAGNOSIS — Z85828 Personal history of other malignant neoplasm of skin: Secondary | ICD-10-CM | POA: Diagnosis not present

## 2021-02-22 NOTE — Telephone Encounter (Signed)
Transition Care Management Unsuccessful Follow-up Telephone Call  Date of discharge and from where: [facility]  02/05/2021  Attempts:  1st Attempt  Reason for unsuccessful TCM follow-up call:  No answer/busy  Jacqlyn Larsen [facility], BSN RN Case Manager [phone]

## 2021-02-23 ENCOUNTER — Telehealth: Payer: Self-pay | Admitting: *Deleted

## 2021-02-23 NOTE — Telephone Encounter (Signed)
Transition Care Management Unsuccessful Follow-up Telephone Call  Date of discharge and from where:  02/05/21  [facility]  Attempts:  2nd Attempt  Reason for unsuccessful TCM follow-up call:  No answer/busy  Jacqlyn Larsen [facility], BSN RN Case Manager [phone]

## 2021-02-24 ENCOUNTER — Ambulatory Visit (INDEPENDENT_AMBULATORY_CARE_PROVIDER_SITE_OTHER): Payer: Medicare HMO

## 2021-02-24 ENCOUNTER — Other Ambulatory Visit: Payer: Self-pay

## 2021-02-24 DIAGNOSIS — Z7901 Long term (current) use of anticoagulants: Secondary | ICD-10-CM | POA: Diagnosis not present

## 2021-02-24 DIAGNOSIS — Z952 Presence of prosthetic heart valve: Secondary | ICD-10-CM

## 2021-02-24 LAB — POCT INR: INR: 1.9 — AB (ref 2.0–3.0)

## 2021-02-24 NOTE — Patient Instructions (Signed)
INCREASE to 1 tablet Daily, except 0.5 tablet Monday, Wednesday and Friday.  INR 4 weeks.

## 2021-02-25 DIAGNOSIS — G4733 Obstructive sleep apnea (adult) (pediatric): Secondary | ICD-10-CM | POA: Diagnosis not present

## 2021-02-27 ENCOUNTER — Encounter: Payer: Self-pay | Admitting: Internal Medicine

## 2021-03-01 ENCOUNTER — Ambulatory Visit (INDEPENDENT_AMBULATORY_CARE_PROVIDER_SITE_OTHER): Payer: Medicare HMO | Admitting: Family

## 2021-03-01 DIAGNOSIS — F17200 Nicotine dependence, unspecified, uncomplicated: Secondary | ICD-10-CM | POA: Diagnosis not present

## 2021-03-01 DIAGNOSIS — Z9889 Other specified postprocedural states: Secondary | ICD-10-CM

## 2021-03-01 DIAGNOSIS — E1165 Type 2 diabetes mellitus with hyperglycemia: Secondary | ICD-10-CM | POA: Diagnosis not present

## 2021-03-01 DIAGNOSIS — R69 Illness, unspecified: Secondary | ICD-10-CM | POA: Diagnosis not present

## 2021-03-01 NOTE — Assessment & Plan Note (Signed)
Doing great post-op.  Monitor.

## 2021-03-01 NOTE — Progress Notes (Signed)
Subjective:     Patient ID: [name], male    DOB: Jan 27, 1946, 76 y.o.   MRN: [id number]  Chief Complaint  Patient presents with   Follow-up    Here for follow up after back surgery    HPI Patient is in today for follow up. Since his last visit he underwent bilateral lumbar 1-2 laminectomy for lumbar stenosis with neurogenic claudication.  He is very pleased with the improvement in his pain and ability to walk following this surgery. Post-op course has been unremarkable.  He has follow up scheduled with his neurosurgeon.   DM2- working with endo. Has follow up scheduled.   Lab Results  Component Value Date   HGBA1C 9.2 (A) 12/21/2020   HGBA1C 9.1 (A) 08/17/2020   HGBA1C 9.6 (A) 05/18/2020   Lab Results  Component Value Date   MICROALBUR <0.7 06/02/2020   [place] 99 09/22/2019   CREATININE 1.15 02/04/2021   HTN- maintained on toprol xl 25mg .  BP Readings from Last 3 Encounters:  03/01/21 (!) 138/55  02/05/21 (!) 161/77  01/20/21 140/69   Hyperlipidemia- states that he ate breakfast today prior to visit.  Lab Results  Component Value Date   CHOL 164 09/09/2020   HDL 27.20 (L) 09/09/2020   LDLCALC 99 09/22/2019   LDLDIRECT 86.0 09/09/2020   TRIG 390.0 (H) 09/09/2020   CHOLHDL 6 09/09/2020      Health Maintenance Due  Topic Date Due   Zoster Vaccines- Shingrix (1 of 2) Never done   COLONOSCOPY (Pts 45-60yrs Insurance coverage will need to be confirmed)  10/06/2019    Past Medical History:  Diagnosis Date   Allergy    Aortic stenosis    Arthritis    CAD (coronary artery disease)    cabg   Cancer ([place]) 01/30/2009   prostate, skin   Chronic kidney disease    Complication of anesthesia    Erectile dysfunction    Heart murmur    Hemochromatosis 02/14/2011   Hemorrhoids    History of gout    History of hepatitis 01/31/1972   Hyperlipidemia    Hypertension    Hypertr obst cardiomyop    Mechanical heart valve present    Manufacturer: [employer] Foot #: [id number]  Model #: [id number]. Card states MRI compatible with 3 teslas or less.   Obesity    moderate   PONV (postoperative nausea and vomiting)    after CABG- slow to wake up   Sensorineural hearing loss    Sleep apnea    Stroke [facility])    Old stroke - patient was not aware.   Type II or unspecified type diabetes mellitus without mention of complication, not stated as uncontrolled     Past Surgical History:  Procedure Laterality Date   AORTIC VALVE REPLACEMENT  11/14/2003   [facility]   APPENDECTOMY  1990   CHOLECYSTECTOMY  1990   CORONARY ARTERY BYPASS GRAFT  10/2003   HERNIA REPAIR  1999   right, inguinal   HERNIA REPAIR  2002   left, inguinal   HIP SURGERY  2006   right hip   LUMBAR LAMINECTOMY/DECOMPRESSION MICRODISCECTOMY Bilateral 02/04/2021   Procedure: Bilateral Lumbar Two-Three Laminectomy;  Surgeon: Kristeen Miss, MD;  Location: [place];  Service: Neurosurgery;  Laterality: Bilateral;  3C/RM 96   PILONIDAL CYST EXCISION  1964   prostate seed implant  3/12   TONSILLECTOMY  childhood    Family History  Problem Relation Age of Onset  Heart disease Mother    Stroke Mother    Heart disease Father    Asperger's syndrome Son    Hyperlipidemia Son    Coronary artery disease Brother    Cancer Neg Hx        negative for colon cancer    Social History   Socioeconomic History   Marital status: Married    Spouse name: Not on file   Number of children: Not on file   Years of education: Not on file   Highest education level: Not on file  Occupational History   Not on file  Tobacco Use   Smoking status: Light Smoker    Types: Cigars   Smokeless tobacco: Never  Substance and Sexual Activity   Alcohol use: Not Currently    Comment: wine maybe 1 per month   Drug use: No   Sexual activity: Never  Other Topics Concern   Not on file  Social History Narrative   Right Handed    Lives in a one sotyr home. One step leads to front door.    Social  Determinants of Health   Financial Resource Strain: Not on file  Food Insecurity: Not on file  Transportation Needs: Not on file  Physical Activity: Not on file  Stress: Not on file  Social Connections: Not on file  Intimate Partner Violence: Not on file    Outpatient Medications Prior to Visit  Medication Sig Dispense Refill   AMBULATORY NON FORMULARY MEDICATION cpap cushions            AirFit F20 (Size: Large)           AirFit F30 (Size: Med)   Please FAX the prescription to:  [phone] 12 each prn   betamethasone dipropionate (DIPROLENE) 0.05 % cream Apply topically 2 (two) times daily as needed. To eczema rash 30 g 1   Blood Glucose Monitoring Suppl Supplies MISC Use for monitoring glucose level 100 each 1   colchicine 0.6 MG tablet TAKE 2 TABS BY MOUTH NOW AND THEN 1 TAB IN 1 HOUR FOR GOUT. (MAX 3 TABS/24 HRS) (Patient taking differently: Take 0.6 mg by mouth daily as needed (gout).) 9 tablet 1   Continuous Blood Gluc Sensor (DEXCOM G6 SENSOR) MISC 1 Device by Does not apply route as directed. 9 each 3   Continuous Blood Gluc Transmit (DEXCOM G6 TRANSMITTER) MISC 1 Device by Does not apply route as directed. 1 each 3   dapagliflozin propanediol (FARXIGA) 10 MG TABS tablet Take 1 tablet (10 mg total) by mouth daily before breakfast. 90 tablet 3   fenofibrate (TRICOR) 145 MG tablet TAKE 1 TABLET BY MOUTH EVERY DAY 90 tablet 0   furosemide (LASIX) 20 MG tablet TAKE 1 TABLET BY MOUTH EVERY DAY AS NEEDED FOR SWELLING (Patient taking differently: Take 20 mg by mouth daily.) 90 tablet 0   glucose blood (ONETOUCH VERIO) test strip Use as instructed 100 strip 12   insulin aspart (NOVOLOG FLEXPEN) 100 UNIT/ML FlexPen Max daily 35 units (Patient taking differently: Inject 12-14 Units into the skin See admin instructions. Max daily 35 units, 12 units in the morning, 12 units at lunch, 14 units at dinner) 30 mL 3   Insulin Glargine (BASAGLAR KWIKPEN) 100 UNIT/ML Inject 38 Units into the  skin daily. (Patient taking differently: Inject 28 Units into the skin daily. hs) 30 mL 4   Insulin Pen Needle 32G X 4 MM MISC 1 Device by Does not apply route in the morning, at  noon, in the evening, and at bedtime. 400 each 3   metoprolol succinate (TOPROL-XL) 25 MG 24 hr tablet TAKE 1 TABLET BY MOUTH EVERY DAY 90 tablet 1   OneTouch Delica Lancets 33H MISC USE AS DIRECTED 100 each 1   rosuvastatin (CRESTOR) 40 MG tablet Take 1 tablet (40 mg total) by mouth daily. (Patient taking differently: Take 40 mg by mouth every evening.) 90 tablet 3   tacrolimus (PROTOPIC) 0.1 % ointment Apply 1 application topically daily as needed (irritation).     triamcinolone ointment (KENALOG) 0.1 % Apply 1 application topically 2 (two) times daily.     warfarin (COUMADIN) 7.5 MG tablet TAKE 1-2 TABLETS DAILY OR AS PRESCRIBED BY COUMADIN CLINIC 180 tablet 0   docusate sodium (COLACE) 100 MG capsule Take 1 capsule (100 mg total) by mouth 2 (two) times daily. 14 capsule 0   methocarbamol (ROBAXIN) 500 MG tablet Take 1 tablet (500 mg total) by mouth every 6 (six) hours as needed for muscle spasms. 28 tablet 0   oxyCODONE-acetaminophen (PERCOCET/ROXICET) 5-325 MG tablet Take 1-2 tablets by mouth every 4 (four) hours as needed for moderate pain or severe pain. 30 tablet 0   No facility-administered medications prior to visit.    No Known Allergies  ROS  See HPI    Objective:    Physical Exam Constitutional:      General: He is not in acute distress.    Appearance: He is well-developed.  HENT:     Head: Normocephalic and atraumatic.  Cardiovascular:     Rate and Rhythm: Normal rate and regular rhythm.     Heart sounds: No murmur heard.    Comments: Mechanical heart valve click Pulmonary:     Effort: Pulmonary effort is normal. No respiratory distress.     Breath sounds: Normal breath sounds. No wheezing or rales.  Musculoskeletal:     Right lower leg: 2+ Edema present.     Left lower leg: 3+ Edema  present.  Skin:    General: Skin is warm and dry.     Comments: Lumbar incision is well healed.   Neurological:     Mental Status: He is alert and oriented to person, place, and time.  Psychiatric:        Behavior: Behavior normal.        Thought Content: Thought content normal.   Diabetic Foot Exam - Simple   Simple Foot Form Diabetic Foot exam was performed with the following findings: Yes 03/01/2021 11:08 AM  Visual Inspection See comments: Yes Sensation Testing See comments: Yes Pulse Check Posterior Tibialis and Dorsalis pulse intact bilaterally: Yes Comments Decreased sensation to monofilament bilaterally      BP (!) 138/55 (BP Location: Right Arm, Patient Position: Sitting, Cuff Size: Small)    Pulse 62    Temp 98.3 F (36.8 C) (Oral)    Resp 16    Wt 218 lb (98.9 kg)    SpO2 97%    BMI 32.19 kg/m  Wt Readings from Last 3 Encounters:  03/01/21 218 lb (98.9 kg)  02/04/21 218 lb (98.9 kg)  01/20/21 217 lb (98.4 kg)       Assessment & Plan:   Problem List Items Addressed This Visit       Unprioritized   Uncontrolled type 2 diabetes mellitus with hyperglycemia ([place])    Management per endocrinology.       TOBACCO USER    Last cigar 1 month ago. Advised pt on cessation.  S/P lumbar laminectomy    Doing great post-op.  Monitor.       25 minutes spent on today's visit. Time was spent counseling patient and reviewign medical records.   I have discontinued Arlyn Buerkle. Pollinger "Len"'s methocarbamol, docusate sodium, and oxyCODONE-acetaminophen. I am also having him maintain his Blood Glucose Monitoring Suppl, betamethasone dipropionate, AMBULATORY NON FORMULARY MEDICATION, Insulin Pen Needle, Dexcom G6 Transmitter, Dexcom G6 Sensor, NovoLOG FlexPen, OneTouch Verio, OneTouch Delica Lancets 47T, warfarin, Basaglar KwikPen, rosuvastatin, tacrolimus, triamcinolone ointment, colchicine, dapagliflozin propanediol, metoprolol succinate, furosemide, and fenofibrate.  No  orders of the defined types were placed in this encounter.

## 2021-03-01 NOTE — Assessment & Plan Note (Signed)
Last cigar 1 month ago. Advised pt on cessation.

## 2021-03-01 NOTE — Assessment & Plan Note (Signed)
Management per endocrinology.

## 2021-03-04 NOTE — Progress Notes (Signed)
DVV:OHYWVPXT of coronary artery disease. Patient is status post coronary artery bypassing graft (LIMA to the LAD, saphenous vein graft to the marginal, saphenous vein graft sequentially to the right coronary artery and distal circumflex) as well as aortic valve replacement (St. Jude aortic valve) in 2005. He also had a septal myectomy at that time. Abdominal CT April 2015 showed no aneurysm.  Nuclear study May 2016 showed normal perfusion and normal LV function. Echocardiogram September 2022 showed normal LV function, previous septal myectomy but no significant gradient, grade 2 diastolic dysfunction, previous AVR with mean gradient 10 mmHg and dilated ascending aorta at 43 mm.  Carotid Dopplers November 2022 showed no significant stenosis.  Since last seen, there is no dyspnea, chest pain, palpitations or syncope.  He has chronic pedal edema.  Current Outpatient Medications  Medication Sig Dispense Refill   AMBULATORY NON FORMULARY MEDICATION cpap cushions            AirFit F20 (Size: Large)           AirFit F30 (Size: Med)   Please FAX the prescription to:  [phone] 12 each prn   betamethasone dipropionate (DIPROLENE) 0.05 % cream Apply topically 2 (two) times daily as needed. To eczema rash 30 g 1   Blood Glucose Monitoring Suppl Supplies MISC Use for monitoring glucose level 100 each 1   colchicine 0.6 MG tablet TAKE 2 TABS BY MOUTH NOW AND THEN 1 TAB IN 1 HOUR FOR GOUT. (MAX 3 TABS/24 HRS) (Patient taking differently: Take 0.6 mg by mouth daily as needed (gout).) 9 tablet 1   Continuous Blood Gluc Sensor (DEXCOM G6 SENSOR) MISC 1 Device by Does not apply route as directed. 9 each 3   Continuous Blood Gluc Transmit (DEXCOM G6 TRANSMITTER) MISC 1 Device by Does not apply route as directed. 1 each 3   dapagliflozin propanediol (FARXIGA) 10 MG TABS tablet Take 1 tablet (10 mg total) by mouth daily before breakfast. 90 tablet 3   fenofibrate (TRICOR) 145 MG tablet TAKE 1 TABLET BY  MOUTH EVERY DAY 90 tablet 0   furosemide (LASIX) 20 MG tablet TAKE 1 TABLET BY MOUTH EVERY DAY AS NEEDED FOR SWELLING (Patient taking differently: Take 20 mg by mouth daily.) 90 tablet 0   glucose blood (ONETOUCH VERIO) test strip Use as instructed 100 strip 12   insulin aspart (NOVOLOG FLEXPEN) 100 UNIT/ML FlexPen Max daily 35 units (Patient taking differently: Inject 12-14 Units into the skin See admin instructions. Max daily 35 units, 12 units in the morning, 12 units at lunch, 14 units at dinner) 30 mL 3   Insulin Glargine (BASAGLAR KWIKPEN) 100 UNIT/ML Inject 38 Units into the skin daily. (Patient taking differently: Inject 28 Units into the skin daily. hs) 30 mL 4   Insulin Pen Needle 32G X 4 MM MISC 1 Device by Does not apply route in the morning, at noon, in the evening, and at bedtime. 400 each 3   metoprolol succinate (TOPROL-XL) 25 MG 24 hr tablet TAKE 1 TABLET BY MOUTH EVERY DAY 90 tablet 1   OneTouch Delica Lancets 06Y MISC USE AS DIRECTED 100 each 1   tacrolimus (PROTOPIC) 0.1 % ointment Apply 1 application topically daily as needed (irritation).     triamcinolone ointment (KENALOG) 0.1 % Apply 1 application topically 2 (two) times daily.     warfarin (COUMADIN) 7.5 MG tablet TAKE 1-2 TABLETS DAILY OR AS PRESCRIBED BY COUMADIN CLINIC 180 tablet 0   rosuvastatin (CRESTOR)  40 MG tablet Take 1 tablet (40 mg total) by mouth daily. (Patient taking differently: Take 40 mg by mouth every evening.) 90 tablet 3   No current facility-administered medications for this visit.     Past Medical History:  Diagnosis Date   Allergy    Aortic stenosis    Arthritis    CAD (coronary artery disease)    cabg   Cancer ([place]) 01/30/2009   prostate, skin   Chronic kidney disease    Complication of anesthesia    Erectile dysfunction    Heart murmur    Hemochromatosis 02/14/2011   Hemorrhoids    History of gout    History of hepatitis 01/31/1972   Hyperlipidemia    Hypertension    Hypertr obst  cardiomyop    Mechanical heart valve present    Manufacturer: Sherlean Foot #: [id number]  Model #: [id number]. Card states MRI compatible with 3 teslas or less.   Obesity    moderate   PONV (postoperative nausea and vomiting)    after CABG- slow to wake up   Sensorineural hearing loss    Sleep apnea    Stroke [facility])    Old stroke - patient was not aware.   Type II or unspecified type diabetes mellitus without mention of complication, not stated as uncontrolled     Past Surgical History:  Procedure Laterality Date   AORTIC VALVE REPLACEMENT  11/14/2003   St Jude [facility]   APPENDECTOMY  1990   CHOLECYSTECTOMY  1990   CORONARY ARTERY BYPASS GRAFT  10/2003   HERNIA REPAIR  1999   right, inguinal   HERNIA REPAIR  2002   left, inguinal   HIP SURGERY  2006   right hip   LUMBAR LAMINECTOMY/DECOMPRESSION MICRODISCECTOMY Bilateral 02/04/2021   Procedure: Bilateral Lumbar Two-Three Laminectomy;  Surgeon: Kristeen Miss, MD;  Location: [place];  Service: Neurosurgery;  Laterality: Bilateral;  3C/RM 5   PILONIDAL CYST EXCISION  1964   prostate seed implant  3/12   TONSILLECTOMY  childhood    Social History   Socioeconomic History   Marital status: Married    Spouse name: Not on file   Number of children: Not on file   Years of education: Not on file   Highest education level: Not on file  Occupational History   Not on file  Tobacco Use   Smoking status: Light Smoker    Types: Cigars   Smokeless tobacco: Never  Substance and Sexual Activity   Alcohol use: Not Currently    Comment: wine maybe 1 per month   Drug use: No   Sexual activity: Never  Other Topics Concern   Not on file  Social History Narrative   Right Handed    Lives in a one sotyr home. One step leads to front door.    Social Determinants of Health   Financial Resource Strain: Not on file  Food Insecurity: Not on file  Transportation Needs: Not on file  Physical Activity: Not on file  Stress: Not on file   Social Connections: Not on file  Intimate Partner Violence: Not on file    Family History  Problem Relation Age of Onset   Heart disease Mother    Stroke Mother    Heart disease Father    Asperger's syndrome Son    Hyperlipidemia Son    Coronary artery disease Brother    Cancer Neg Hx        negative for colon cancer  ROS: Bilateral leg weakness improving following recent back surgery but no fevers or chills, productive cough, hemoptysis, dysphasia, odynophagia, melena, hematochezia, dysuria, hematuria, rash, seizure activity, orthopnea, PND, claudication. Remaining systems are negative.  Physical Exam: Well-developed well-nourished in no acute distress.  Skin is warm and dry.  HEENT is normal.  Neck is supple.  Chest is clear to auscultation with normal expansion.  Cardiovascular exam is regular rate and rhythm.  Crisp mechanical valve sound.  2/6 systolic murmur but no diastolic murmur. Abdominal exam nontender or distended. No masses palpated. Extremities show 1+ edema. neuro grossly intact   A/P  1 status post aortic valve replacement-continue Coumadin.  Continue SBE prophylaxis.  2 coronary artery disease-patient doing well with no chest pain.  Continue statin.  No aspirin given need for Coumadin.  3 hypertension-blood pressure controlled.  Continue present medications.  4 hyperlipidemia-continue statin. Check lipids and liver..   5 history of hypertrophic cardiomyopathy-status post myectomy with no gradient noted on recent echocardiogram.  Continue beta-blocker.  6 dilated ascending aorta-Aorta unchanged on recent echocardiogram.  We will plan CTA September 2023.  Kirk Ruths, MD

## 2021-03-09 ENCOUNTER — Ambulatory Visit: Payer: Medicare HMO | Admitting: Family

## 2021-03-09 ENCOUNTER — Other Ambulatory Visit: Payer: Self-pay

## 2021-03-09 ENCOUNTER — Ambulatory Visit: Payer: Medicare HMO | Admitting: Pulmonary Disease

## 2021-03-09 ENCOUNTER — Encounter: Payer: Self-pay | Admitting: Pulmonary Disease

## 2021-03-09 VITALS — BP 122/70 | HR 66 | Ht 69.0 in | Wt 219.4 lb

## 2021-03-09 DIAGNOSIS — G4733 Obstructive sleep apnea (adult) (pediatric): Secondary | ICD-10-CM | POA: Diagnosis not present

## 2021-03-09 NOTE — Progress Notes (Signed)
[facility]  Chief Complaint  Patient presents with   Consult    Referred by PCP for history of OSA. States he has a CPAP but has not been using it.     Past Surgical History:  He  has a past surgical history that includes prostate seed implant (3/12); Coronary artery bypass graft (10/2003); Aortic valve replacement (11/14/2003); Hip surgery (2006); Cholecystectomy (1990); Hernia repair (1999); Hernia repair (2002); Pilonidal cyst excision (1964); Tonsillectomy (childhood); Appendectomy (1990); and Lumbar laminectomy/decompression microdiscectomy (Bilateral, 02/04/2021).  Past Medical History:  Allergies, Aortic stenosis s/p AVR, CAD s/p CABG, Prostate cancer, CKD, ED, Hemochromatosis, Gout, HTN, HLD, CVA, DM type 2  Constitutional:  BP 122/70    Pulse 66    Ht 5\' 9"  (1.753 m)    Wt 219 lb 6.4 oz (99.5 kg)    SpO2 98% Comment: on RA   BMI 32.40 kg/m   Brief Summary:  [name] is a 76 y.o. male with obstructive sleep apnea.      Subjective:   He is here with his wife.  He had a sleep study in 2014 that showed moderate sleep apnea.  He was started on CPAP.  He tried several different masks.  He would get very dry at night and stopped using CPAP.  He reports he was still able to get a new CPAP machine recently.  His wife says he snores and stops breathing sometimes.  He grinds his teeth at night.  He doesn't have a mouth guard.  He naps in the afternoon and falls asleep if the TV show is boring.  He goes to sleep at 10 pm.  He falls asleep in 20 minutes.  He wakes up 1 or 2 times to use the bathroom.  He gets out of bed at 730 am.  He feels okay in the morning.  He denies morning headache.  He does not use anything to help him fall sleep or stay awake.  He denies sleep walking, sleep talking, or nightmares.  There is no history of restless legs.  He denies sleep hallucinations, sleep paralysis, or cataplexy.  The Epworth score is 4  out of 24.   Physical Exam:   Appearance - well kempt   ENMT - no sinus tenderness, no oral exudate, no LAN, Mallampati 4 airway, no stridor, extensive dental work,  poor hearing  Respiratory - equal breath sounds bilaterally, no wheezing or rales  CV - s1s2 regular rate and rhythm, no murmurs  Ext - no clubbing, no edema  Skin - no rashes  Psych - normal mood and affect   Sleep Tests:  HST 03/06/12 >> AHI 18.4, SpO2 low 82%  Cardiac Tests:  Echo 10/11/20 >> EF 60 to 65%, grade 2 DD, St Jude AVR  Social History:  He  reports that he has been smoking cigars. He has never used smokeless tobacco. He reports that he does not currently use alcohol. He reports that he does not use drugs.  Family History:  His family history includes Asperger's syndrome in his son; Coronary artery disease in his brother; Heart disease in his father and mother; Hyperlipidemia in his son; Stroke in his mother.    Discussion:  He has snoring, sleep disruption, apnea, and daytime sleepiness.  He has history of coronary artery disease and hypertension.  He has prior history of obstructive sleep apnea, and I am concerned he could still have this.  Assessment/Plan:   Snoring with  excessive daytime sleepiness. - will need to arrange for a home sleep study  Obesity. - discussed how weight can impact sleep and risk for sleep disordered breathing - discussed options to assist with weight loss: combination of diet modification, cardiovascular and strength training exercises  Cardiovascular risk. - had an extensive discussion regarding the adverse health consequences related to untreated sleep disordered breathing - specifically discussed the risks for hypertension, coronary artery disease, cardiac dysrhythmias, cerebrovascular disease, and diabetes - lifestyle modification discussed  Safe driving practices. - discussed how sleep disruption can increase risk of accidents, particularly when driving - safe  driving practices were discussed  Therapies for obstructive sleep apnea. - if the sleep study shows significant sleep apnea, then various therapies for treatment were reviewed: CPAP, oral appliance, and surgical interventions   Time Spent Involved in Patient Care on Day of Examination:  36 minutes  Follow up:   Patient Instructions  Will arrange for home sleep study Will call to arrange for follow up after sleep study reviewed   Medication List:   Allergies as of 03/09/2021   No Known Allergies      Medication List        Accurate as of March 09, 2021 11:12 AM. If you have any questions, ask your nurse or doctor.          AMBULATORY NON FORMULARY MEDICATION cpap cushions            AirFit F20 (Size: Large)           AirFit F30 (Size: Med)   Please FAX the prescription to:  [phone]   Basaglar KwikPen 100 UNIT/ML Inject 38 Units into the skin daily. What changed:  how much to take additional instructions   betamethasone dipropionate 0.05 % cream Apply topically 2 (two) times daily as needed. To eczema rash   Blood Glucose Monitoring Suppl Supplies Misc Use for monitoring glucose level   colchicine 0.6 MG tablet TAKE 2 TABS BY MOUTH NOW AND THEN 1 TAB IN 1 HOUR FOR GOUT. (MAX 3 TABS/24 HRS) What changed:  how much to take how to take this when to take this reasons to take this additional instructions   dapagliflozin propanediol 10 MG Tabs tablet Commonly known as: Farxiga Take 1 tablet (10 mg total) by mouth daily before breakfast.   Dexcom G6 Sensor Misc 1 Device by Does not apply route as directed.   Dexcom G6 Transmitter Misc 1 Device by Does not apply route as directed.   fenofibrate 145 MG tablet Commonly known as: TRICOR TAKE 1 TABLET BY MOUTH EVERY DAY   furosemide 20 MG tablet Commonly known as: LASIX TAKE 1 TABLET BY MOUTH EVERY DAY AS NEEDED FOR SWELLING What changed: See the new instructions.   Insulin Pen Needle 32G X 4  MM Misc 1 Device by Does not apply route in the morning, at noon, in the evening, and at bedtime.   metoprolol succinate 25 MG 24 hr tablet Commonly known as: TOPROL-XL TAKE 1 TABLET BY MOUTH EVERY DAY   NovoLOG FlexPen 100 UNIT/ML FlexPen Generic drug: insulin aspart Max daily 35 units What changed:  how much to take how to take this when to take this additional instructions   OneTouch Delica Lancets 94W Misc USE AS DIRECTED   OneTouch Verio test strip Generic drug: glucose blood Use as instructed   rosuvastatin 40 MG tablet Commonly known as: CRESTOR Take 1 tablet (40 mg total) by mouth daily. What changed: when to take  this   tacrolimus 0.1 % ointment Commonly known as: PROTOPIC Apply 1 application topically daily as needed (irritation).   triamcinolone ointment 0.1 % Commonly known as: KENALOG Apply 1 application topically 2 (two) times daily.   warfarin 7.5 MG tablet Commonly known as: COUMADIN Take as directed by the anticoagulation clinic. If you are unsure how to take this medication, talk to your nurse or doctor. Original instructions: TAKE 1-2 TABLETS DAILY OR AS PRESCRIBED BY COUMADIN CLINIC        Signature:  Chesley Mires, MD [place] Pager - [phone] 03/09/2021, 11:12 AM

## 2021-03-09 NOTE — Patient Instructions (Signed)
Will arrange for home sleep study Will call to arrange for follow up after sleep study reviewed  

## 2021-03-16 ENCOUNTER — Other Ambulatory Visit: Payer: Self-pay

## 2021-03-16 ENCOUNTER — Encounter: Payer: Self-pay | Admitting: Cardiology

## 2021-03-16 ENCOUNTER — Ambulatory Visit: Payer: Medicare HMO | Admitting: Cardiology

## 2021-03-16 VITALS — BP 128/66 | HR 58 | Ht 69.0 in | Wt 217.0 lb

## 2021-03-16 DIAGNOSIS — I2510 Atherosclerotic heart disease of native coronary artery without angina pectoris: Secondary | ICD-10-CM | POA: Diagnosis not present

## 2021-03-16 DIAGNOSIS — Z952 Presence of prosthetic heart valve: Secondary | ICD-10-CM

## 2021-03-16 DIAGNOSIS — E785 Hyperlipidemia, unspecified: Secondary | ICD-10-CM | POA: Diagnosis not present

## 2021-03-16 DIAGNOSIS — I10 Essential (primary) hypertension: Secondary | ICD-10-CM

## 2021-03-16 NOTE — Patient Instructions (Signed)
°  Lab Work:  Your physician recommends that you return for lab work FASTING  If you have labs (blood work) drawn today and your tests are completely normal, you will receive your results only by: [place] (if you have [place]) OR A paper copy in the mail If you have any lab test that is abnormal or we need to change your treatment, we will call you to review the results.   Follow-Up: At [facility], you and your health needs are our priority.  As part of our continuing mission to provide you with exceptional heart care, we have created designated Provider Care Teams.  These Care Teams include your primary Cardiologist (physician) and Advanced Practice Providers (APPs -  Physician Assistants and Nurse Practitioners) who all work together to provide you with the care you need, when you need it.  We recommend signing up for the patient portal called "MyChart".  Sign up information is provided on this After Visit Summary.  MyChart is used to connect with patients for Virtual Visits (Telemedicine).  Patients are able to view lab/test results, encounter notes, upcoming appointments, etc.  Non-urgent messages can be sent to your provider as well.   To learn more about what you can do with MyChart, go to [email or web address].    Your next appointment:   6 month(s)  The format for your next appointment:   In Person  Provider:   Kirk Ruths, MD

## 2021-03-22 DIAGNOSIS — E119 Type 2 diabetes mellitus without complications: Secondary | ICD-10-CM | POA: Diagnosis not present

## 2021-03-24 ENCOUNTER — Ambulatory Visit (INDEPENDENT_AMBULATORY_CARE_PROVIDER_SITE_OTHER): Payer: Medicare HMO

## 2021-03-24 ENCOUNTER — Other Ambulatory Visit: Payer: Self-pay

## 2021-03-24 DIAGNOSIS — E785 Hyperlipidemia, unspecified: Secondary | ICD-10-CM | POA: Diagnosis not present

## 2021-03-24 DIAGNOSIS — Z952 Presence of prosthetic heart valve: Secondary | ICD-10-CM | POA: Diagnosis not present

## 2021-03-24 DIAGNOSIS — Z7901 Long term (current) use of anticoagulants: Secondary | ICD-10-CM

## 2021-03-24 LAB — POCT INR: INR: 2.8 (ref 2.0–3.0)

## 2021-03-24 NOTE — Patient Instructions (Signed)
Continue taking 1 tablet Daily, except 0.5 tablet Monday, Wednesday and Friday.  INR 6 weeks.

## 2021-03-25 LAB — LIPID PANEL
Chol/HDL Ratio: 5.6 ratio — ABNORMAL HIGH (ref 0.0–5.0)
Cholesterol, Total: 162 mg/dL (ref 100–199)
HDL: 29 mg/dL — ABNORMAL LOW (ref >39)
LDL Chol Calc (NIH): 77 mg/dL (ref 0–99)
Triglycerides: 349 mg/dL — ABNORMAL HIGH (ref 0–149)
VLDL Cholesterol Cal: 56 mg/dL — ABNORMAL HIGH (ref 5–40)

## 2021-03-25 LAB — HEPATIC FUNCTION PANEL
ALT: 19 IU/L (ref 0–44)
AST: 28 IU/L (ref 0–40)
Albumin: 4.2 g/dL (ref 3.7–4.7)
Alkaline Phosphatase: 56 IU/L (ref 44–121)
Bilirubin Total: 0.3 mg/dL (ref 0.0–1.2)
Bilirubin, Direct: 0.12 mg/dL (ref 0.00–0.40)
Total Protein: 7.2 g/dL (ref 6.0–8.5)

## 2021-03-28 ENCOUNTER — Ambulatory Visit: Payer: Medicare HMO | Attending: Neurological Surgery | Admitting: Physical Therapy

## 2021-03-28 DIAGNOSIS — Z8546 Personal history of malignant neoplasm of prostate: Secondary | ICD-10-CM | POA: Diagnosis not present

## 2021-03-28 DIAGNOSIS — G4733 Obstructive sleep apnea (adult) (pediatric): Secondary | ICD-10-CM | POA: Diagnosis not present

## 2021-03-28 DIAGNOSIS — D0462 Carcinoma in situ of skin of left upper limb, including shoulder: Secondary | ICD-10-CM | POA: Diagnosis not present

## 2021-03-30 DIAGNOSIS — H2513 Age-related nuclear cataract, bilateral: Secondary | ICD-10-CM | POA: Diagnosis not present

## 2021-03-30 DIAGNOSIS — H52223 Regular astigmatism, bilateral: Secondary | ICD-10-CM | POA: Diagnosis not present

## 2021-03-30 DIAGNOSIS — E119 Type 2 diabetes mellitus without complications: Secondary | ICD-10-CM | POA: Diagnosis not present

## 2021-03-30 DIAGNOSIS — H0102A Squamous blepharitis right eye, upper and lower eyelids: Secondary | ICD-10-CM | POA: Diagnosis not present

## 2021-03-30 LAB — HM DIABETES EYE EXAM

## 2021-04-01 DIAGNOSIS — Z8546 Personal history of malignant neoplasm of prostate: Secondary | ICD-10-CM | POA: Diagnosis not present

## 2021-04-05 DIAGNOSIS — Z0101 Encounter for examination of eyes and vision with abnormal findings: Secondary | ICD-10-CM | POA: Diagnosis not present

## 2021-04-15 DIAGNOSIS — E1122 Type 2 diabetes mellitus with diabetic chronic kidney disease: Secondary | ICD-10-CM | POA: Diagnosis not present

## 2021-04-15 DIAGNOSIS — Z952 Presence of prosthetic heart valve: Secondary | ICD-10-CM | POA: Diagnosis not present

## 2021-04-15 DIAGNOSIS — N1832 Chronic kidney disease, stage 3b: Secondary | ICD-10-CM | POA: Diagnosis not present

## 2021-04-15 DIAGNOSIS — I129 Hypertensive chronic kidney disease with stage 1 through stage 4 chronic kidney disease, or unspecified chronic kidney disease: Secondary | ICD-10-CM | POA: Diagnosis not present

## 2021-04-15 LAB — BASIC METABOLIC PANEL
BUN: 30 — AB (ref 4–21)
Creatinine: 1.2 (ref 0.6–1.3)
Glucose: 349

## 2021-04-15 LAB — COMPREHENSIVE METABOLIC PANEL: eGFR: 60

## 2021-04-16 ENCOUNTER — Other Ambulatory Visit: Payer: Self-pay | Admitting: Family

## 2021-04-20 ENCOUNTER — Ambulatory Visit (INDEPENDENT_AMBULATORY_CARE_PROVIDER_SITE_OTHER): Payer: Medicare HMO | Admitting: Pharmacist

## 2021-04-20 ENCOUNTER — Ambulatory Visit: Payer: Medicare HMO | Admitting: Pharmacist

## 2021-04-20 ENCOUNTER — Encounter: Payer: Self-pay | Admitting: Pharmacist

## 2021-04-20 ENCOUNTER — Other Ambulatory Visit: Payer: Self-pay

## 2021-04-20 VITALS — BP 146/82 | HR 58 | Resp 14 | Ht 68.5 in | Wt 222.0 lb

## 2021-04-20 DIAGNOSIS — Z7901 Long term (current) use of anticoagulants: Secondary | ICD-10-CM

## 2021-04-20 DIAGNOSIS — E785 Hyperlipidemia, unspecified: Secondary | ICD-10-CM | POA: Diagnosis not present

## 2021-04-20 DIAGNOSIS — Z952 Presence of prosthetic heart valve: Secondary | ICD-10-CM | POA: Diagnosis not present

## 2021-04-20 DIAGNOSIS — E781 Pure hyperglyceridemia: Secondary | ICD-10-CM | POA: Diagnosis not present

## 2021-04-20 LAB — POCT INR: INR: 2.6 (ref 2.0–3.0)

## 2021-04-20 MED ORDER — ICOSAPENT ETHYL 1 G PO CAPS: 2.0000 g | ORAL_CAPSULE | Freq: Two times a day (BID) | ORAL | 5 refills | Status: DC

## 2021-04-20 NOTE — Progress Notes (Signed)
Patient ID: [name]                 DOB: 11-22-1945                    MRN: [id number] ? ? ? ? ?HPI: ?[name] is a 76 y.o. male patient referred to lipid clinic by Dr Stanford Breed. PMH is significant for CAD, AVR, CKD, HTN, HLD, and T2DM.   Patient's last lipid panel showed hypertriglyceridemia. ? ?Patient presents today with wife. Very hard of hearing. Currently managed on rosuvastatin '40mg'$  and fenofibrate '145mg'$  daily. However triglycerides remain elevated at 349. ? ?Patient has not been following a diabetic diet. Wife reports he likes to eat and cook and will often drink sugary drinks and eat snack foods while out. Wears a Dexcom continuous glucose monitor and is on Consulting civil engineer.  Novolog is dosed by sliding scale but he admits he will occasionally forget to inject before eating. Patient admits he loves eating. ? ?Has been cautious with eating vegetables due to his Coumadin.  Reports he rarely drinks any alcohol. ? ?Was on Lovaza in the past but he reports it was very expensive. ? ?Current Medications:  ?Rosuvastatin '40mg'$  daily ?Fenofibrate '145mg'$  daily ? ?Intolerances: N/A ? ?Risk Factors:  ?CAD ?CVA ?T2DM ?HTN ? ?LDL goal: <55 ?Triglyceride goal: <150 ? ?Labs: Trigs 349, HDL 29, LDL 77, TC 162 (03/24/21 on Fenofibrate 145 and Rosuvastatin 40) ? ?Past Medical History:  ?Diagnosis Date  ? Allergy   ? Aortic stenosis   ? Arthritis   ? CAD (coronary artery disease)   ? cabg  ? Cancer ([place]) 01/30/2009  ? prostate, skin  ? Chronic kidney disease   ? Complication of anesthesia   ? Erectile dysfunction   ? Heart murmur   ? Hemochromatosis 02/14/2011  ? Hemorrhoids   ? History of gout   ? History of hepatitis 01/31/1972  ? Hyperlipidemia   ? Hypertension   ? Hypertr obst cardiomyop   ? Mechanical heart valve present   ? Manufacturer: [employer]   [place] #: [id number]  Model #: [id number]. Card states MRI compatible with 3 teslas or less.  ? Obesity   ? moderate  ? PONV (postoperative nausea and vomiting)    ? after CABG- slow to wake up  ? Sensorineural hearing loss   ? Sleep apnea   ? Stroke [facility])   ? Old stroke - patient was not aware.  ? Type II or unspecified type diabetes mellitus without mention of complication, not stated as uncontrolled   ? ? ?Current Outpatient Medications on File Prior to Visit  ?Medication Sig Dispense Refill  ? AMBULATORY NON FORMULARY MEDICATION cpap cushions ? ?          AirFit F20 (Size: Large) ?          AirFit F30 (Size: Med) ?  ?Please FAX the prescription to:  [phone] 12 each prn  ? betamethasone dipropionate (DIPROLENE) 0.05 % cream Apply topically 2 (two) times daily as needed. To eczema rash 30 g 1  ? Blood Glucose Monitoring Suppl Supplies MISC Use for monitoring glucose level 100 each 1  ? colchicine 0.6 MG tablet TAKE 2 TABS BY MOUTH NOW AND THEN 1 TAB IN 1 HOUR FOR GOUT. (MAX 3 TABS/24 HRS) (Patient taking differently: Take 0.6 mg by mouth daily as needed (gout).) 9 tablet 1  ? Continuous Blood Gluc Sensor (DEXCOM G6 SENSOR) MISC 1 Device by Does not  apply route as directed. 9 each 3  ? Continuous Blood Gluc Transmit (DEXCOM G6 TRANSMITTER) MISC 1 Device by Does not apply route as directed. 1 each 3  ? dapagliflozin propanediol (FARXIGA) 10 MG TABS tablet Take 1 tablet (10 mg total) by mouth daily before breakfast. 90 tablet 3  ? fenofibrate (TRICOR) 145 MG tablet TAKE 1 TABLET BY MOUTH EVERY DAY 90 tablet 0  ? furosemide (LASIX) 20 MG tablet TAKE 1 TABLET BY MOUTH EVERY DAY AS NEEDED FOR SWELLING 90 tablet 0  ? glucose blood (ONETOUCH VERIO) test strip Use as instructed 100 strip 12  ? insulin aspart (NOVOLOG FLEXPEN) 100 UNIT/ML FlexPen Max daily 35 units (Patient taking differently: Inject 12-14 Units into the skin See admin instructions. Max daily 35 units, 12 units in the morning, 12 units at lunch, 14 units at dinner) 30 mL 3  ? Insulin Glargine (BASAGLAR KWIKPEN) 100 UNIT/ML Inject 38 Units into the skin daily. (Patient taking differently: Inject 28 Units into  the skin daily. hs) 30 mL 4  ? Insulin Pen Needle 32G X 4 MM MISC 1 Device by Does not apply route in the morning, at noon, in the evening, and at bedtime. 400 each 3  ? metoprolol succinate (TOPROL-XL) 25 MG 24 hr tablet TAKE 1 TABLET BY MOUTH EVERY DAY 90 tablet 1  ? OneTouch Delica Lancets 79T MISC USE AS DIRECTED 100 each 1  ? rosuvastatin (CRESTOR) 40 MG tablet Take 1 tablet (40 mg total) by mouth daily. (Patient taking differently: Take 40 mg by mouth every evening.) 90 tablet 3  ? tacrolimus (PROTOPIC) 0.1 % ointment Apply 1 application topically daily as needed (irritation).    ? triamcinolone ointment (KENALOG) 0.1 % Apply 1 application topically 2 (two) times daily.    ? warfarin (COUMADIN) 7.5 MG tablet TAKE 1-2 TABLETS DAILY OR AS PRESCRIBED BY COUMADIN CLINIC 180 tablet 0  ? ?No current facility-administered medications on file prior to visit.  ? ? ?No Known Allergies ? ?Assessment/Plan: ? ?1. Hyperlipidemia - Patient triglycerides of 349 is above goal of <150 despite fenofibrate and rosuvastatin. Is not completely maxed out on fenofibrate dose but not sure if increasing dose will be very beneficial. Likely triglycerides are elevated due to poorly controlled DM.  Counseled patient on trying to avoid sugary drinks and snack foods. Will add on Vascepa at this time 2grams BID. Patient to call or message if medication is too expensive for him. Likely will also need to consider PCSK9i since LDL also above goal of <55 however this is also likely to improve it patient improves lifestyle. ? ?Continue rosuvastatin '40mg'$  daily ?Continue fenofibrate '145mg'$  daily ?Start Vascepa 2 grams BID ?Recheck lipid panel in 2-3 months ? ?Karren Cobble, PharmD, BCACP, [place], CPP ?[address], [address] ?[place], [place], [address] ?Phone: [phone], Fax: [phone]  ?  ?

## 2021-04-20 NOTE — Patient Instructions (Signed)
Description   ?Continue taking 1 tablet Daily, except 0.5 tablet Monday, Wednesday and Friday.  INR 6 weeks. ?  ? ? ?

## 2021-04-20 NOTE — Patient Instructions (Addendum)
It was nice seeing you today ? ?We would like your triglycerides to be less than 150 ? ?Continue your rosuvastatin '40mg'$  and fenofibrate '145mg'$  daily ? ?I would like to start you on a different fish oil called Vascepa.  You will take 2 capsules twice a day ? ?Please watch how much sugar you are eating ? ?We will recheck your cholesterol panel in 2-3 months ? ?Please call with any questions! ? ?Karren Cobble, PharmD, BCACP, [place], CPP ?[address], [address] ?[place], [place], [address] ?Phone: [phone], Fax: [phone]  ?

## 2021-04-21 DIAGNOSIS — E119 Type 2 diabetes mellitus without complications: Secondary | ICD-10-CM | POA: Diagnosis not present

## 2021-04-26 ENCOUNTER — Other Ambulatory Visit: Payer: Self-pay

## 2021-04-26 ENCOUNTER — Ambulatory Visit: Payer: Medicare HMO | Admitting: Internal Medicine

## 2021-04-26 ENCOUNTER — Encounter: Payer: Self-pay | Admitting: Pharmacist Clinician (PhC)/ Clinical Pharmacy Specialist

## 2021-04-26 ENCOUNTER — Encounter: Payer: Self-pay | Admitting: Internal Medicine

## 2021-04-26 VITALS — BP 120/74 | HR 62 | Ht 68.5 in | Wt 223.0 lb

## 2021-04-26 DIAGNOSIS — E1122 Type 2 diabetes mellitus with diabetic chronic kidney disease: Secondary | ICD-10-CM

## 2021-04-26 DIAGNOSIS — N183 Chronic kidney disease, stage 3 unspecified: Secondary | ICD-10-CM | POA: Diagnosis not present

## 2021-04-26 DIAGNOSIS — E1165 Type 2 diabetes mellitus with hyperglycemia: Secondary | ICD-10-CM

## 2021-04-26 DIAGNOSIS — R739 Hyperglycemia, unspecified: Secondary | ICD-10-CM | POA: Diagnosis not present

## 2021-04-26 LAB — POCT GLYCOSYLATED HEMOGLOBIN (HGB A1C): Hemoglobin A1C: 8 % — AB (ref 4.0–5.6)

## 2021-04-26 MED ORDER — NOVOLOG FLEXPEN 100 UNIT/ML ~~LOC~~ SOPN: PEN_INJECTOR | SUBCUTANEOUS | 3 refills | Status: DC

## 2021-04-26 MED ORDER — DAPAGLIFLOZIN PROPANEDIOL 10 MG PO TABS: 10.0000 mg | ORAL_TABLET | Freq: Every day | ORAL | 3 refills | Status: DC

## 2021-04-26 MED ORDER — BASAGLAR KWIKPEN 100 UNIT/ML ~~LOC~~ SOPN: 24.0000 [IU] | PEN_INJECTOR | Freq: Every day | SUBCUTANEOUS | 3 refills | Status: DC

## 2021-04-26 MED ORDER — INSULIN PEN NEEDLE 32G X 4 MM MISC: 1.0000 | Freq: Four times a day (QID) | 3 refills | Status: DC

## 2021-04-26 NOTE — Patient Instructions (Signed)
?-   Continue Farxiga 10 mg , 1 tablet every morning  ?- Decrease  Basaglar 24 units daily  ?- Change Novolog to 14 units with Breakfast, 14 units with Lunch and 14 units with Supper  ?Novolog correctional insulin: ADD extra units on insulin to your meal-time Novolog dose if your blood sugars are higher than 185. Use the scale below to help guide you:  ? ?Blood sugar before meal Number of units to inject  ?Less than 185 0 unit  ?186 -  210 1 units  ?211 -  235 2 units  ?236 -  260 3 units  ?261 -  285 4 units  ?286 -  310 5 units  ?311 -  335 6 units  ?336 -  360 7 units  ?361 -  385 8 units  ?386 - 410  9 units   ? ? ? ? ?HOW TO TREAT LOW BLOOD SUGARS (Blood sugar LESS THAN 70 MG/DL) ?Please follow the RULE OF 15 for the treatment of hypoglycemia treatment (when your (blood sugars are less than 70 mg/dL)  ? ?STEP 1: Take 15 grams of carbohydrates when your blood sugar is low, which includes:  ?3-4 GLUCOSE TABS  OR ?3-4 OZ OF JUICE OR REGULAR SODA OR ?ONE TUBE OF GLUCOSE GEL   ? ?STEP 2: RECHECK blood sugar in 15 MINUTES ?STEP 3: If your blood sugar is still low at the 15 minute recheck --> then, go back to STEP 1 and treat AGAIN with another 15 grams of carbohydrates. ? ?

## 2021-04-26 NOTE — Progress Notes (Signed)
?  ? ? ?Name: [name]  ?Age/ Sex: 76 y.o., male   ?MRN/ DOB: [id number], 02/10/1945    ? ?PCP: [name], NP   ?Reason for Endocrinology Evaluation: Type 2 Diabetes Mellitus  ?Initial Endocrine Consultative Visit: 11/11/2018  ? ? ?PATIENT IDENTIFIER: [name] is a 76 y.o. male with a past medical history of T2DM, OSA, HTN, CAD ( S/P CABG) . The patient has followed with Endocrinology clinic since 11/11/2018 for consultative assistance with management of his diabetes. ? ?DIABETIC HISTORY:  ?[name] was diagnosed with T2DM in 2005. He was on metformin which was stopped due to renal dysfunction . Has been on insulin since 2015. His hemoglobin A1c has ranged from 6.3% in 2015, peaking at 8.3% in 2020. ? ?On his initial visit to our clinic he was on basal insulin only. With an A1c of 8.3. Trulicity was added.  ? ? ?Novolog started 04/2020 due to an A1c 9.6%  ?Stopped trulicity due to cost  ?Started Farxiga 07/2020 through pt assistance program  ? ?SUBJECTIVE:  ? ?During the last visit (12/21/2020): A1c of 9.2 %.  We adjusted MDI regimen and continued [place] ? ? ?Today (04/26/2021): [name] is here for a  follow up on diabetes management. He is accompanied by his wife today . He checks his blood sugars multiple  times daily, through dexcom.  The patient has not had hypoglycemic episodes since the last clinic visit. ? ? ? ?Was evaluated by cardiology in 03/2021 S/P aortic valve replacement, on Coumadin and CAD as well as dilated ascending aorta ?Saw pulmonology 03/2021 for OSA ?He is s/p lumbar laminectomy 01/2021, recovered well  ? ? ?Denies nausea, vomiting or diarrhea  ? ? ? ?HOME DIABETES REGIMEN:  ?Basaglar 26  units daily  ?Novolog 12 units with breakfast and lunch and 14 units with supper  ?Farxiga 10 mg daily  ? ? ? ? ? ? ?CONTINUOUS GLUCOSE MONITORING RECORD INTERPRETATION   ? ?Dates of Recording: 3/15-3/28/2023 ? ?Sensor description: dexcom  ? ?Results statistics: ?  ?CGM use % of  time 93  ?Average and SD 164/43  ?Time in range      68  %  ?% Time Above 180 28  ?% Time above 250 3  ?% Time Below target <1  ? ? ? ? ?Glycemic patterns summary: Bg's optimal over night, slight hyperglycemia during the day  ? ?Hyperglycemic episodes  postprandial  ? ?Hypoglycemic episodes occurred  N/A ? ?Overnight periods:  trends down to goal  ? ? ? ?DIABETIC COMPLICATIONS: ?Microvascular complications:  ?CKDIII- Dr. Hollie Salk  ?Denies: neuropathy, retinopathy  ?Last eye exam: Completed 03/25/2020 ?   ?Macrovascular complications:  ?CAD (S/P CABG )  ?Denies: PVD, CVA ?  ? ?HISTORY:  ?Past Medical History:  ?Past Medical History:  ?Diagnosis Date  ? Allergy   ? Aortic stenosis   ? Arthritis   ? CAD (coronary artery disease)   ? cabg  ? Cancer ([place]) 01/30/2009  ? prostate, skin  ? Chronic kidney disease   ? Complication of anesthesia   ? Erectile dysfunction   ? Heart murmur   ? Hemochromatosis 02/14/2011  ? Hemorrhoids   ? History of gout   ? History of hepatitis 01/31/1972  ? Hyperlipidemia   ? Hypertension   ? Hypertr obst cardiomyop   ? Mechanical heart valve present   ? 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 #: [id number]  Model #: [id number]. Card states MRI compatible with 3  teslas or less.  ? Obesity   ? moderate  ? PONV (postoperative nausea and vomiting)   ? after CABG- slow to wake up  ? Sensorineural hearing loss   ? Sleep apnea   ? Stroke [facility])   ? Old stroke - patient was not aware.  ? Type II or unspecified type diabetes mellitus without mention of complication, not stated as uncontrolled   ? ?Past Surgical History:  ?Past Surgical History:  ?Procedure Laterality Date  ? AORTIC VALVE REPLACEMENT  11/14/2003  ? [facility]  ? APPENDECTOMY  1990  ? CHOLECYSTECTOMY  1990  ? CORONARY ARTERY BYPASS GRAFT  10/2003  ? HERNIA REPAIR  1999  ? right, inguinal  ? HERNIA REPAIR  2002  ? left, inguinal  ? HIP SURGERY  2006  ? right hip  ? LUMBAR LAMINECTOMY/DECOMPRESSION MICRODISCECTOMY Bilateral 02/04/2021  ? Procedure:  Bilateral Lumbar Two-Three Laminectomy;  Surgeon: Kristeen Miss, MD;  Location: [place];  Service: Neurosurgery;  Laterality: Bilateral;  3C/RM 20  ? [place]  ? prostate seed implant  3/12  ? TONSILLECTOMY  childhood  ? ?Social History:  reports that he has been smoking cigars. He has never used smokeless tobacco. He reports that he does not currently use alcohol. He reports that he does not use drugs. ?Family History:  ?Family History  ?Problem Relation Age of Onset  ? Heart disease Mother   ? Stroke Mother   ? Heart disease Father   ? Asperger's syndrome Son   ? Hyperlipidemia Son   ? Coronary artery disease Brother   ? Cancer Neg Hx   ?     negative for colon cancer  ? ? ? ?HOME MEDICATIONS: ?Allergies as of 04/26/2021   ?No Known Allergies ?  ? ?  ?Medication List  ?  ? ?  ? Accurate as of April 26, 2021  9:15 AM. If you have any questions, ask your nurse or doctor.  ?  ?  ? ?  ? ?AMBULATORY NON FORMULARY MEDICATION ?cpap cushions ? ?          AirFit F20 (Size: Large) ?          AirFit F30 (Size: Med) ?  ?Please FAX the prescription to:  [phone] ?  ?Basaglar KwikPen 100 UNIT/ML ?Inject 38 Units into the skin daily. ?What changed:  ?how much to take ?additional instructions ?  ?betamethasone dipropionate 0.05 % cream ?Apply topically 2 (two) times daily as needed. To eczema rash ?  ?Blood Glucose Monitoring Suppl Supplies Misc ?Use for monitoring glucose level ?  ?colchicine 0.6 MG tablet ?TAKE 2 TABS BY MOUTH NOW AND THEN 1 TAB IN 1 HOUR FOR GOUT. (MAX 3 TABS/24 HRS) ?What changed:  ?how much to take ?how to take this ?when to take this ?reasons to take this ?additional instructions ?  ?dapagliflozin propanediol 10 MG Tabs tablet ?Commonly known as: [place] ?Take 1 tablet (10 mg total) by mouth daily before breakfast. ?  ?Dexcom G6 Sensor Misc ?1 Device by Does not apply route as directed. ?  ?Dexcom G6 Transmitter Misc ?1 Device by Does not apply route as directed. ?  ?fenofibrate 145  MG tablet ?Commonly known as: TRICOR ?TAKE 1 TABLET BY MOUTH EVERY DAY ?  ?furosemide 20 MG tablet ?Commonly known as: LASIX ?TAKE 1 TABLET BY MOUTH EVERY DAY AS NEEDED FOR SWELLING ?  ?icosapent Ethyl 1 g capsule ?Commonly known as: Vascepa ?Take 2 capsules (2 g total)  by mouth 2 (two) times daily. ?  ?Insulin Pen Needle 32G X 4 MM Misc ?1 Device by Does not apply route in the morning, at noon, in the evening, and at bedtime. ?  ?metoprolol succinate 25 MG 24 hr tablet ?Commonly known as: TOPROL-XL ?TAKE 1 TABLET BY MOUTH EVERY DAY ?  ?NovoLOG FlexPen 100 UNIT/ML FlexPen ?Generic drug: insulin aspart ?Max daily 35 units ?What changed:  ?how much to take ?how to take this ?when to take this ?additional instructions ?  ?OneTouch Delica Lancets 41P Misc ?USE AS DIRECTED ?  ?OneTouch Verio test strip ?Generic drug: glucose blood ?Use as instructed ?  ?rosuvastatin 40 MG tablet ?Commonly known as: CRESTOR ?Take 1 tablet (40 mg total) by mouth daily. ?What changed: when to take this ?  ?tacrolimus 0.1 % ointment ?Commonly known as: PROTOPIC ?Apply 1 application topically daily as needed (irritation). ?  ?triamcinolone ointment 0.1 % ?Commonly known as: KENALOG ?Apply 1 application topically 2 (two) times daily. ?  ?warfarin 7.5 MG tablet ?Commonly known as: COUMADIN ?Take as directed by the anticoagulation clinic. If you are unsure how to take this medication, talk to your nurse or doctor. ?Original instructions: TAKE 1-2 TABLETS DAILY OR AS PRESCRIBED BY COUMADIN CLINIC ?  ? ?  ? ? ? ?OBJECTIVE:  ? ?Vital Signs: BP 120/74 (BP Location: Left Arm, Patient Position: Sitting, Cuff Size: Small)   Pulse 62   Ht 5' 8.5" (1.74 m)   Wt 223 lb (101.2 kg)   SpO2 95%   BMI 33.41 kg/m?   ?Wt Readings from Last 3 Encounters:  ?04/26/21 223 lb (101.2 kg)  ?04/20/21 222 lb (100.7 kg)  ?03/16/21 217 lb (98.4 kg)  ? ? ? ?Exam: ?General: Pt appears well and is in NAD  ?Lungs: Clear with good BS bilat with no rales, rhonchi, or  wheezes  ?Heart: RRR with normal S1 and S2 and no gallops; no murmurs; no rub  ?Extremities: Trace  pretibial edema.   ?Neuro: MS is good with appropriate affect, pt is alert and Ox3  ? ?  ? ?DM Foot Exam 04/26/2021

## 2021-04-27 ENCOUNTER — Encounter: Payer: Self-pay | Admitting: Internal Medicine

## 2021-04-27 ENCOUNTER — Ambulatory Visit: Payer: Medicare HMO | Attending: Neurological Surgery | Admitting: Physical Therapy

## 2021-04-27 ENCOUNTER — Other Ambulatory Visit (HOSPITAL_BASED_OUTPATIENT_CLINIC_OR_DEPARTMENT_OTHER): Payer: Self-pay | Admitting: Pharmacist Clinician (PhC)/ Clinical Pharmacy Specialist

## 2021-04-27 DIAGNOSIS — M6281 Muscle weakness (generalized): Secondary | ICD-10-CM | POA: Insufficient documentation

## 2021-04-27 DIAGNOSIS — R262 Difficulty in walking, not elsewhere classified: Secondary | ICD-10-CM | POA: Insufficient documentation

## 2021-04-27 DIAGNOSIS — M545 Low back pain, unspecified: Secondary | ICD-10-CM | POA: Insufficient documentation

## 2021-04-27 MED ORDER — WARFARIN SODIUM 7.5 MG PO TABS: ORAL_TABLET | ORAL | 1 refills | Status: DC

## 2021-04-27 NOTE — Therapy (Signed)
[place] ?[facility] ?[address] ?[place], [place], [address] ?Phone: [phone]   Fax:  [phone] ? ?Physical Therapy Evaluation ? ?Patient Details  ?Name: [name] ?MRN: [id number] ?Date of Birth: January 14, 1946 ?Referring Provider (PT): [name] ? ? ?Encounter Date: 04/27/2021 ? ? PT End of Session - 04/27/21 1206   ? ? Visit Number 1   ? Number of Visits 6   ? Date for PT Re-Evaluation 06/08/21   ? Authorization Type [employer] MCR   ? Progress Note Due on Visit 6   ? PT Start Time 1103   ? PT Stop Time 1145   ? PT Time Calculation (min) 42 min   ? Activity Tolerance Patient tolerated treatment well   ? Behavior During Therapy [facility] for tasks assessed/performed   ? ?  ?  ? ?  ? ? ?Past Medical History:  ?Diagnosis Date  ? Allergy   ? Aortic stenosis   ? Arthritis   ? CAD (coronary artery disease)   ? cabg  ? Cancer ([place]) 01/30/2009  ? prostate, skin  ? Chronic kidney disease   ? Complication of anesthesia   ? Erectile dysfunction   ? Heart murmur   ? Hemochromatosis 02/14/2011  ? Hemorrhoids   ? History of gout   ? History of hepatitis 01/31/1972  ? Hyperlipidemia   ? Hypertension   ? Hypertr obst cardiomyop   ? Mechanical heart valve present   ? 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 Card states MRI compatible with 3 teslas or less.  ? Obesity   ? moderate  ? PONV (postoperative nausea and vomiting)   ? after CABG- slow to wake up  ? Sensorineural hearing loss   ? Sleep apnea   ? Stroke [facility])   ? Old stroke - patient was not aware.  ? Type II or unspecified type diabetes mellitus without mention of complication, not stated as uncontrolled   ? ? ?Past Surgical History:  ?Procedure Laterality Date  ? AORTIC VALVE REPLACEMENT  11/14/2003  ? [facility]  ? APPENDECTOMY  1990  ? CHOLECYSTECTOMY  1990  ? CORONARY ARTERY BYPASS GRAFT  10/2003  ? HERNIA REPAIR  1999  ? right, inguinal  ? HERNIA REPAIR  2002  ? left,  inguinal  ? HIP SURGERY  2006  ? right hip  ? LUMBAR LAMINECTOMY/DECOMPRESSION MICRODISCECTOMY Bilateral 02/04/2021  ? Procedure: Bilateral Lumbar Two-Three Laminectomy;  Surgeon: Kristeen Miss, MD;  Location: [facility];  Service: Neurosurgery;  Laterality: Bilateral;  3C/RM 20  ? [place]  ? prostate seed implant  3/12  ? TONSILLECTOMY  childhood  ? ? ?There were no vitals filed for this visit. ? ? ? Subjective Assessment - 04/27/21 1112   ? ? Subjective Pt. reports he had back surgery on Feb 04, 2021, and it helped his back pain significantly.  He does not have back pain now and sensation in back in his R leg.  He reports weakness in his legs now, especially when walking on slopes.  He reports he doesn't trust his legs since his knees gave out 4 times in last year.  He also reorts some R shoulder pain.   ? How long can you walk comfortably? just gets tired   ? Patient Stated Goals get his strength back   ? Currently in Pain? No/denies   ? ?  ?  ? ?  ? ? ? ? ?  [facility] PT Assessment - 04/27/21 0001   ? ?  ? Assessment  ? Medical Diagnosis M48.062 (ICD-10-CM) - Spinal stenosis, lumbar region with neurogenic claudication   ? Referring Provider (PT) [name]   ? Onset Date/Surgical Date 02/04/21   ? Hand Dominance Right   ? Prior Therapy yes   ?  ? Precautions  ? Precautions None   ?  ? Restrictions  ? Weight Bearing Restrictions No   ?  ? Balance Screen  ? Has the patient fallen in the past 6 months Yes   ? How many times? 2   Fell in yard and fell on step.  No injury  ? Has the patient had a decrease in activity level because of a fear of falling?  Yes   ? Is the patient reluctant to leave their home because of a fear of falling?  No   ?  ? Home Environment  ? Living Environment Private residence   ? Living Arrangements Spouse/significant other   ? Type of Home House   ? Home Access Stairs to enter   ? Entrance Stairs-Number of Steps 1   ? Entrance Stairs-Rails None   ? Home Layout One level   ?  [place] - 2 wheels;Cane - single point   ?  ? Prior Function  ? Level of Independence Independent   ? Vocation Retired   ? Leisure church, walking - going to sr[employer] center   ?  ? Cognition  ? Overall Cognitive Status Within Functional Limits for tasks assessed   ?  ? Observation/Other Assessments  ? Observations enters independently, no apparent distress.   ? Focus on Therapeutic Outcomes (FOTO)  lumbar 47%, predicted outcome 60% after 11 visits.   ?  ? Sensation  ? Light Touch Appears Intact   ?  ? Posture/Postural Control  ? Posture/Postural Control Postural limitations   ? Postural Limitations Rounded Shoulders;Forward head;Decreased lumbar lordosis   ?  ? ROM / Strength  ? AROM / PROM / Strength AROM;Strength   ?  ? AROM  ? Overall AROM  Within functional limits for tasks performed   ? Overall AROM Comments decreased lumbar extension and SB bil, no pain with movements   ?  ? Strength  ? Overall Strength Deficits   ? Overall Strength Comments tested in sitting.  Noted decreased R eccentric  quad control on stairs.   ? Strength Assessment Site Hip;Knee;Ankle   ? Right/Left Hip Right;Left   ? Right Hip Flexion 4/5   ? Right Hip ABduction 4+/5   ? Right Hip ADduction 5/5   ? Left Hip Flexion 4/5   ? Left Hip ABduction 4+/5   ? Left Hip ADduction 5/5   ? Right/Left Knee Right;Left   ? Right Knee Flexion 4+/5   ? Right Knee Extension 4+/5   ? Left Knee Flexion 4+/5   ? Left Knee Extension 5/5   ? Right/Left Ankle Right;Left   ? Right Ankle Dorsiflexion 4+/5   ? Left Ankle Dorsiflexion 4+/5   ?  ? Transfers  ? Five time sit to stand comments  12.6 sec without UE assist   ?  ? Ambulation/Gait  ? Ambulation/Gait Yes   ? Ambulation/Gait Assistance 7: Independent   ? Ambulation Distance (Feet) 300 Feet   ? Assistive device None   ? Gait Pattern Within Functional Limits   ? Stairs Yes   ? Stairs Assistance 6: Modified independent (Device/Increase time)   ? Stair  Management Technique One rail Right;Alternating  pattern   decreased eccentric control descending on R  ? Number of Stairs 14   ? Height of Stairs 8   ?  ? Balance  ? Balance Assessed Yes   ?  ? Static Standing Balance  ? Static Standing - Balance Support No upper extremity supported   ? Static Standing - Level of Assistance 7: Independent   ? Static Standing Balance -  Activities  Single Leg Stance - Left Leg;Single Leg Stance - Right Leg;Tandam Stance - Right Leg;Tandam Stance - Left Leg;Romberg - Eyes Closed;Romberg - Eyes Closed , Foam;Romberg - Eyes Opened, Foam;Romberg - Eyes Opened   ? Static Standing - Comment/# of Minutes Tandem stance 5 sec R, 12 sec L, SLS 3 sec bil, EO x 30 sec, EC x 30 sec, EO foam x 30 sec, EC foam x 30 sec but increased sway   ?  ? Standardized Balance Assessment  ? Standardized Balance Assessment Dynamic Gait Index   ?  ? Dynamic Gait Index  ? Level Surface Normal   ? Change in Gait Speed Mild Impairment   ? Gait with Horizontal Head Turns Mild Impairment   ? Gait with Vertical Head Turns Normal   ? Gait and Pivot Turn Normal   ? Step Over Obstacle Moderate Impairment   ? Step Around Obstacles Normal   ? Steps Mild Impairment   ? Total Score 19   ? DGI comment: moderate risk of falls   ? ?  ?  ? ?  ? ? ? ? ? ? ? ? ? ? ? ? ? ?Objective measurements completed on examination: See above findings.  ? ? ? ? ? ? ? ? ? ? ? ? ? ? PT Education - 04/27/21 1219   ? ? Education Details discussed exercise machines at [employer] could start working on- nustep, recumbent bike especially.  Also leg curls and leg extension, will review with machines here.   ? Person(s) Educated Patient   ? Methods Explanation   ? Comprehension Verbalized understanding   ? ?  ?  ? ?  ? ? ? PT Short Term Goals - 04/27/21 1213   ? ?  ? PT SHORT TERM GOAL #1  ? Title Patient to be independent with initial HEP.   ? Time 3   ? Period Weeks   ? Status New   ? Target Date 05/18/21   ? ?  ?  ? ?  ? ? ? ? PT Long Term Goals - 04/27/21 1214   ? ?  ? PT LONG TERM GOAL #1  ?  Title Patient to be independent with advanced HEP.   ? Time 6   ? Period Weeks   ? Status New   ? Target Date 06/08/21   ?  ? PT LONG TERM GOAL #2  ? Title Pt. will be able to walk for 1 mile on uneven surfac

## 2021-05-11 ENCOUNTER — Ambulatory Visit: Payer: Medicare HMO | Attending: Neurological Surgery

## 2021-05-11 ENCOUNTER — Ambulatory Visit: Payer: Medicare HMO

## 2021-05-11 DIAGNOSIS — M6281 Muscle weakness (generalized): Secondary | ICD-10-CM | POA: Insufficient documentation

## 2021-05-11 DIAGNOSIS — R262 Difficulty in walking, not elsewhere classified: Secondary | ICD-10-CM | POA: Diagnosis not present

## 2021-05-11 DIAGNOSIS — G4733 Obstructive sleep apnea (adult) (pediatric): Secondary | ICD-10-CM

## 2021-05-11 DIAGNOSIS — M545 Low back pain, unspecified: Secondary | ICD-10-CM | POA: Diagnosis not present

## 2021-05-11 NOTE — Addendum Note (Signed)
Addended by: Rennie Natter on: 05/11/2021 11:01 AM ? ? Modules accepted: Orders ? ?

## 2021-05-11 NOTE — Patient Instructions (Signed)
Access Code: [facility] ?URL: [email or web address] ?Date: 05/11/2021 ?Prepared by: Clarene Essex ? ?Exercises ?- Sit to Stand  - 1 x daily - 7 x weekly - 2 sets - 10 reps ?- Mini Squat with Counter Support  - 1 x daily - 7 x weekly - 2 sets - 10 reps ?- Supine Bridge  - 1 x daily - 7 x weekly - 3 sets - 10 reps ?- Active Straight Leg Raise with Quad Set  - 1 x daily - 7 x weekly - 3 sets - 10 reps ?

## 2021-05-11 NOTE — Therapy (Signed)
[facility] ?[place] ?[place], [place], [address] ?Phone: [phone]   Fax:  [phone] ? ?Physical Therapy Treatment ? ?Patient Details  ?Name: [name] ?MRN: [id number] ?Date of Birth: [birth year] ?Referring Provider (PT): [name] ? ? ?Encounter Date: 05/11/2021 ? ? PT End of Session - 05/11/21 1016   ? ? Visit Number 2   ? Number of Visits 6   ? Date for PT Re-Evaluation 06/08/21   ? Authorization Type Aetna MCR   ? Progress Note Due on Visit 6   ? PT Start Time 0930   ? PT Stop Time 1011   ? PT Time Calculation (min) 41 min   ? Activity Tolerance Patient tolerated treatment well   ? Behavior During Therapy [facility] for tasks assessed/performed   ? ?  ?  ? ?  ? ? ?Past Medical History:  ?Diagnosis Date  ? Allergy   ? Aortic stenosis   ? Arthritis   ? CAD (coronary artery disease)   ? cabg  ? Cancer ([place]) 01/30/2009  ? prostate, skin  ? Chronic kidney disease   ? Complication of anesthesia   ? Erectile dysfunction   ? Heart murmur   ? Hemochromatosis 02/14/2011  ? Hemorrhoids   ? History of gout   ? History of hepatitis 01/31/1972  ? Hyperlipidemia   ? Hypertension   ? Hypertr obst cardiomyop   ? Mechanical heart valve present   ? Manufacturer: [employer]   [place] #: [id number]  Model #: [id number]. Card states MRI compatible with 3 teslas or less.  ? Obesity   ? moderate  ? PONV (postoperative nausea and vomiting)   ? after CABG- slow to wake up  ? Sensorineural hearing loss   ? Sleep apnea   ? Stroke [facility])   ? Old stroke - patient was not aware.  ? Type II or unspecified type diabetes mellitus without mention of complication, not stated as uncontrolled   ? ? ?Past Surgical History:  ?Procedure Laterality Date  ? AORTIC VALVE REPLACEMENT  11/14/2003  ? [facility]  ? APPENDECTOMY  1990  ? CHOLECYSTECTOMY  1990  ? CORONARY ARTERY BYPASS GRAFT  10/2003  ? HERNIA REPAIR  1999  ? right, inguinal  ? HERNIA REPAIR  2002  ? left, inguinal   ? HIP SURGERY  2006  ? right hip  ? LUMBAR LAMINECTOMY/DECOMPRESSION MICRODISCECTOMY Bilateral 02/04/2021  ? Procedure: Bilateral Lumbar Two-Three Laminectomy;  Surgeon: Kristeen Miss, MD;  Location: [place] [facility];  Service: Neurosurgery;  Laterality: Bilateral;  3C/RM 20  ? [place]  ? prostate seed implant  3/12  ? TONSILLECTOMY  childhood  ? ? ?There were no vitals filed for this visit. ? ? Subjective Assessment - 05/11/21 0932   ? ? Subjective Pt reports having N/T in legs and feet after his surgery, knees are still weak and don't fully trust them.   ? Patient Stated Goals get his strength back   ? Currently in Pain? No/denies   ? ?  ?  ? ?  ? ? ? ? ? ? ? ? ? ? ? ? ? ? ? ? ? ? ? ? [place] Adult PT Treatment/Exercise - 05/11/21 0001   ? ?  ? Exercises  ? Exercises Lumbar;Knee/Hip   ?  ? Lumbar Exercises: Aerobic  ? Recumbent Bike [id number]   ?  ? Lumbar Exercises: Standing  ? Functional Squats 10  reps   ? Functional Squats Limitations mini squats with UE support   ?  ? Lumbar Exercises: Seated  ? Sit to Stand 10 reps   ? Sit to Stand Limitations no UE support   ?  ? Knee/Hip Exercises: Machines for Strengthening  ? Cybex Knee Extension 20lb 10 reps   ? Cybex Knee Flexion 25lb 10 reps   ? Cybex Leg Press 20lb 10 reps   ?  ? Knee/Hip Exercises: Standing  ? Hip Abduction AROM;Both;20 reps;Knee straight   ? Abduction Limitations UE support   ?  ? Knee/Hip Exercises: Supine  ? Bridges Strengthening;Both;10 reps;2 sets   last set with arms crossed  ? Bridges Limitations with abd brace   ? Straight Leg Raises Strengthening;Both;10 reps   ? ?  ?  ? ?  ? ? ? ? ? ? ? ? ? ? PT Education - 05/11/21 1015   ? ? Education Details HEP update; edu on weight machines to use at [employer]   ? Person(s) Educated Patient   ? Methods Explanation;Demonstration;Handout   ? Comprehension Verbalized understanding;Returned demonstration   ? ?  ?  ? ?  ? ? ? PT Short Term Goals - 05/11/21 1123   ? ?  ? PT SHORT TERM GOAL #1  ?  Title Patient to be independent with initial HEP.   ? Time 3   ? Period Weeks   ? Status On-going   ? Target Date 05/18/21   ? ?  ?  ? ?  ? ? ? ? PT Long Term Goals - 05/11/21 1123   ? ?  ? PT LONG TERM GOAL #1  ? Title Patient to be independent with advanced HEP.   ? Time 6   ? Period Weeks   ? Status On-going   ? Target Date 06/08/21   ?  ? PT LONG TERM GOAL #2  ? Title Pt. will be able to walk for 1 mile on uneven surfaces without report of fatigue or instability.   ? Baseline fatigues quickly, difficulty on uneven surfaces   ? Time 6   ? Period Weeks   ? Status On-going   ? Target Date 06/08/21   ?  ? PT LONG TERM GOAL #3  ? Title Pt. to demonstrate improved function as demonstrated by FOTO > or = to 60%   ? Baseline 47%   ? Time 6   ? Period Weeks   ? Status On-going   ?  ? PT LONG TERM GOAL #4  ? Title Patient to ascend/descend 13 steps with 1 handrail as needed with reciprocal pattern and good stability.   ? Baseline poor eccentric control descending   ? Time 6   ? Period Weeks   ? Status On-going   ? Target Date 06/08/21   ?  ? PT LONG TERM GOAL #5  ? Title Pt. will demonstrate decreased risk of falls by being able to maintain tandem stance 30 sec bil.   ? Baseline R 5 sec, L 12 sec   ? Time 6   ? Period Weeks   ? Status On-going   ? Target Date 06/08/21   ? ?  ?  ? ?  ? ? ? ? ? ? ? ? Plan - 05/11/21 1017   ? ? Clinical Impression Statement Pt had no complaints with the interventions today. Provided instruction for use of gym machines and reducing weight for now to avoid excessive stress on spine.  Postural cues needed with standing hip abduction. Cues for TKE with SLR. HEP provided today with instruction given, pt plans to do HEP and go to [employer] on alternate days.   ? Personal Factors and Comorbidities Age;Comorbidity 3+   ? Comorbidities extremely HOH, T2DM on insulin, on Coumadin for heart valve replacement, recent back surgery, falls, arthritis, history prostate cancer, current tobacco user   ?  PT Frequency Other (comment)   1-2x per week  ? PT Duration 6 weeks   ? PT Treatment/Interventions ADLs/Self Care Home Management;Cryotherapy;Moist Heat;Gait training;Stair training;Functional mobility training;Therapeutic activities;Therapeutic exercise;Balance training;Neuromuscular re-education;Manual techniques;Patient/family education   ? PT Next Visit Plan review HEP if needed; progress WB exercises for LE strengthening; focus on transitioning to gym based exercise program, higher level balance activities   ? Consulted and Agree with Plan of Care Patient   ? ?  ?  ? ?  ? ? ?Patient will benefit from skilled therapeutic intervention in order to improve the following deficits and impairments:  Decreased activity tolerance, Decreased endurance, Decreased range of motion, Decreased strength, Improper body mechanics, Pain, Postural dysfunction, Impaired flexibility, Difficulty walking, Decreased balance ? ?Visit Diagnosis: ?Muscle weakness (generalized) ? ?Difficulty in walking, not elsewhere classified ? ?Low back pain without sciatica, unspecified back pain laterality, unspecified chronicity ? ? ? ? ?Problem List ?Patient Active Problem List  ? Diagnosis Date Noted  ? Spinal stenosis 02/05/2021  ? S/P lumbar laminectomy 02/04/2021  ? Lumbar stenosis with neurogenic claudication 02/04/2021  ? Leg weakness, bilateral 01/18/2021  ? Acute low back pain 01/18/2021  ? Cerebrovascular accident (CVA) ([place]) 12/27/2020  ? Memory loss 12/07/2020  ? Type 2 diabetes mellitus with diabetic polyneuropathy, with long-term current use of insulin ([place]) 11/11/2018  ? Type 2 diabetes mellitus with stage 3 chronic kidney disease, with long-term current use of insulin ([place]) 11/11/2018  ? Hx of CABG 11/05/2018  ? Dyslipidemia 11/05/2018  ? Primary osteoarthritis of left shoulder 10/31/2018  ? Long term (current) use of anticoagulants 05/22/2018  ? Hereditary hemochromatosis ([place]) 03/30/2016  ? Gout 04/09/2015  ? Ventral hernia  03/03/2014  ? History of prostate cancer 11/28/2013  ? OSA (obstructive sleep apnea) 07/12/2012  ? Uncontrolled type 2 diabetes mellitus with hyperglycemia ([place]) 05/26/2012  ? Hemochromatosis 02/14/2011  ? [facility]

## 2021-05-16 ENCOUNTER — Encounter: Payer: Self-pay | Admitting: Family

## 2021-05-18 ENCOUNTER — Ambulatory Visit: Payer: Medicare HMO

## 2021-05-21 DIAGNOSIS — E119 Type 2 diabetes mellitus without complications: Secondary | ICD-10-CM | POA: Diagnosis not present

## 2021-05-24 ENCOUNTER — Ambulatory Visit: Payer: Medicare HMO

## 2021-05-24 DIAGNOSIS — G4733 Obstructive sleep apnea (adult) (pediatric): Secondary | ICD-10-CM

## 2021-05-25 ENCOUNTER — Telehealth: Payer: Self-pay | Admitting: Pulmonary Disease

## 2021-05-25 ENCOUNTER — Ambulatory Visit: Payer: Medicare HMO | Admitting: Physical Therapy

## 2021-05-25 ENCOUNTER — Encounter: Payer: Self-pay | Admitting: Physical Therapy

## 2021-05-25 DIAGNOSIS — R262 Difficulty in walking, not elsewhere classified: Secondary | ICD-10-CM

## 2021-05-25 DIAGNOSIS — G4733 Obstructive sleep apnea (adult) (pediatric): Secondary | ICD-10-CM | POA: Diagnosis not present

## 2021-05-25 DIAGNOSIS — M6281 Muscle weakness (generalized): Secondary | ICD-10-CM | POA: Diagnosis not present

## 2021-05-25 DIAGNOSIS — M545 Low back pain, unspecified: Secondary | ICD-10-CM | POA: Diagnosis not present

## 2021-05-25 NOTE — Telephone Encounter (Signed)
Called and went over results with patient. He voiced understanding. Appt scheduled. Nothing further needed  ?

## 2021-05-25 NOTE — Therapy (Signed)
[facility] ?[facility] ?[place] ?[place], [place], [address] ?Phone: [phone]   Fax:  [phone] ? ?Physical Therapy Treatment ? ?Patient Details  ?Name: [name] ?MRN: [id number] ?Date of Birth: [birth year] ?Referring Provider (PT): [name] ? ? ?Encounter Date: 05/25/2021 ? ? PT End of Session - 05/25/21 1107   ? ? Visit Number 3   ? Number of Visits 6   ? Date for PT Re-Evaluation 06/08/21   ? Authorization Type Aetna MCR   ? Progress Note Due on Visit 6   ? PT Start Time 1017   ? PT Stop Time 1101   ? PT Time Calculation (min) 44 min   ? Activity Tolerance Patient tolerated treatment well   ? Behavior During Therapy [facility] for tasks assessed/performed   ? ?  ?  ? ?  ? ? ?Past Medical History:  ?Diagnosis Date  ? Allergy   ? Aortic stenosis   ? Arthritis   ? CAD (coronary artery disease)   ? cabg  ? Cancer ([place]) 01/30/2009  ? prostate, skin  ? Chronic kidney disease   ? Complication of anesthesia   ? Erectile dysfunction   ? Heart murmur   ? Hemochromatosis 02/14/2011  ? Hemorrhoids   ? History of gout   ? History of hepatitis 01/31/1972  ? Hyperlipidemia   ? Hypertension   ? Hypertr obst cardiomyop   ? Mechanical heart valve present   ? Manufacturer: [employer]   [place] #: [id number]  Model #: [id number]. Card states MRI compatible with 3 teslas or less.  ? Obesity   ? moderate  ? PONV (postoperative nausea and vomiting)   ? after CABG- slow to wake up  ? Sensorineural hearing loss   ? Sleep apnea   ? Stroke [facility])   ? Old stroke - patient was not aware.  ? Type II or unspecified type diabetes mellitus without mention of complication, not stated as uncontrolled   ? ? ?Past Surgical History:  ?Procedure Laterality Date  ? AORTIC VALVE REPLACEMENT  11/14/2003  ? [facility]  ? APPENDECTOMY  1990  ? CHOLECYSTECTOMY  1990  ? CORONARY ARTERY BYPASS GRAFT  10/2003  ? HERNIA REPAIR  1999  ? right, inguinal  ? HERNIA REPAIR  2002  ? left, inguinal   ? HIP SURGERY  2006  ? right hip  ? LUMBAR LAMINECTOMY/DECOMPRESSION MICRODISCECTOMY Bilateral 02/04/2021  ? Procedure: Bilateral Lumbar Two-Three Laminectomy;  Surgeon: Kristeen Miss, MD;  Location: [facility];  Service: Neurosurgery;  Laterality: Bilateral;  3C/RM 20  ? [place]  ? prostate seed implant  3/12  ? TONSILLECTOMY  childhood  ? ? ?There were no vitals filed for this visit. ? ? Subjective Assessment - 05/25/21 1107   ? ? Subjective Pt. reports good compliance with HEP, going to gym and bringing records with him logging exercises.  "I don't think about my back at all."  Still difficulty on stairs.   ? Patient Stated Goals get his strength back   ? Currently in Pain? No/denies   ? ?  ?  ? ?  ? ? ? ? ? ? ? ? ? ? ? ? ? ? ? ? ? ? ? ? [place] Adult PT Treatment/Exercise - 05/25/21 0001   ? ?  ? Exercises  ? Exercises Lumbar;Knee/Hip   ?  ? Lumbar Exercises: Aerobic  ? Other Aerobic Exercise Gait x 5  min (1200')   ?  ? Lumbar Exercises: Standing  ? Functional Squats 20 reps   ? Functional Squats Limitations taps on cushion to bias glutes   ? Other Standing Lumbar Exercises single leg RDLs x 10 with plinth in front for safety, challenging due to balance   ? Other Standing Lumbar Exercises standing hip abduction, flexion and extension without support.   ?  ? Knee/Hip Exercises: Machines for Strengthening  ? Cybex Leg Press 25#  10 res, heel raises 25# x 10   ? Other Machine rows 25# x 10, chest press 15# x 10.  Trialed lat pull down - increased R shoulder pain, also increased pain with butterfly so not added to program.   ? ?  ?  ? ?  ? ? ? ? ? ? ? ? ? ? PT Education - 05/25/21 1106   ? ? Education Details HEP update Access Code: [email or web address]   ? Person(s) Educated Patient   ? Methods Explanation;Demonstration;Verbal cues;Handout   ? Comprehension Verbalized understanding;Returned demonstration   ? ?  ?  ? ?  ? ? ? PT Short Term Goals - 05/11/21 1123   ? ?  ? PT SHORT TERM GOAL #1  ? Title Patient to be  independent with initial HEP.   ? Time 3   ? Period Weeks   ? Status On-going   ? Target Date 05/18/21   ? ?  ?  ? ?  ? ? ? ? PT Long Term Goals - 05/11/21 1123   ? ?  ? PT LONG TERM GOAL #1  ? Title Patient to be independent with advanced HEP.   ? Time 6   ? Period Weeks   ? Status On-going   ? Target Date 06/08/21   ?  ? PT LONG TERM GOAL #2  ? Title Pt. will be able to walk for 1 mile on uneven surfaces without report of fatigue or instability.   ? Baseline fatigues quickly, difficulty on uneven surfaces   ? Time 6   ? Period Weeks   ? Status On-going   ? Target Date 06/08/21   ?  ? PT LONG TERM GOAL #3  ? Title Pt. to demonstrate improved function as demonstrated by FOTO > or = to 60%   ? Baseline 47%   ? Time 6   ? Period Weeks   ? Status On-going   ?  ? PT LONG TERM GOAL #4  ? Title Patient to ascend/descend 13 steps with 1 handrail as needed with reciprocal pattern and good stability.   ? Baseline poor eccentric control descending   ? Time 6   ? Period Weeks   ? Status On-going   ? Target Date 06/08/21   ?  ? PT LONG TERM GOAL #5  ? Title Pt. will demonstrate decreased risk of falls by being able to maintain tandem stance 30 sec bil.   ? Baseline R 5 sec, L 12 sec   ? Time 6   ? Period Weeks   ? Status On-going   ? Target Date 06/08/21   ? ?  ?  ? ?  ? ? ? ? ? ? ? ? Plan - 05/25/21 1204   ? ? Clinical Impression Statement [name] is making good progress, he has significantly increased his activity level and is not limited by back pain.  Reviewed exercises today and progressed as he is finding counter exercises easy, to add band for more resistance,  also use less support, but continue to stay by counter for safety.  Also reviewed gym machines, adding heel raises on leg press and rows.  We did try some other shoulder/back exercises, but he was limited by R shoulder pain.  Also discontinued supine/mat exercises and focused on standing and gym exercises based on preference.  Discussed adding walking program  also with goal of 20 minutes.  Today he was able to walk 1/5 mile without any discomfort or DOE.  He would benefit from continued skilled therapy.   ? Personal Factors and Comorbidities Age;Comorbidity 3+   ? Comorbidities extremely HOH, T2DM on insulin, on Coumadin for heart valve replacement, recent back surgery, falls, arthritis, history prostate cancer, current tobacco user   ? PT Frequency Other (comment)   1-2x per week  ? PT Duration 6 weeks   ? PT Treatment/Interventions ADLs/Self Care Home Management;Cryotherapy;Moist Heat;Gait training;Stair training;Functional mobility training;Therapeutic activities;Therapeutic exercise;Balance training;Neuromuscular re-education;Manual techniques;Patient/family education   ? PT Next Visit Plan review HEP if needed; progress WB exercises for LE strengthening; focus on transitioning to gym based exercise program, higher level balance activities   ? Consulted and Agree with Plan of Care Patient   ? ?  ?  ? ?  ? ? ?Patient will benefit from skilled therapeutic intervention in order to improve the following deficits and impairments:  Decreased activity tolerance, Decreased endurance, Decreased range of motion, Decreased strength, Improper body mechanics, Pain, Postural dysfunction, Impaired flexibility, Difficulty walking, Decreased balance ? ?Visit Diagnosis: ?Muscle weakness (generalized) ? ?Difficulty in walking, not elsewhere classified ? ?Low back pain without sciatica, unspecified back pain laterality, unspecified chronicity ? ? ? ? ?Problem List ?Patient Active Problem List  ? Diagnosis Date Noted  ? Spinal stenosis 02/05/2021  ? S/P lumbar laminectomy 02/04/2021  ? Lumbar stenosis with neurogenic claudication 02/04/2021  ? Leg weakness, bilateral 01/18/2021  ? Acute low back pain 01/18/2021  ? Cerebrovascular accident (CVA) ([place]) 12/27/2020  ? Memory loss 12/07/2020  ? Type 2 diabetes mellitus with diabetic polyneuropathy, with long-term current use of insulin ([place])  11/11/2018  ? Type 2 diabetes mellitus with stage 3 chronic kidney disease, with long-term current use of insulin ([place]) 11/11/2018  ? Hx of CABG 11/05/2018  ? Dyslipidemia 11/05/2018  ? Primary osteoar

## 2021-05-25 NOTE — Telephone Encounter (Signed)
HST 05/24/21 >> AHI 41.2, SpO2 low 85% ? ?Please inform him that his sleep study shows severe obstructive sleep apnea.  Please arrange for ROV with me or NP to discuss treatment options. ? ? ? ?

## 2021-05-25 NOTE — Patient Instructions (Signed)
Access Code: [email or web address] ?URL: [email or web address] ?Date: 05/25/2021 ?Prepared by: Glenetta Hew ? ?Exercises ?- Forward T with Counter Support  - 1 x daily - 7 x weekly - 3 sets - 10 reps ?- Hip Abduction with Resistance Loop  - 1 x daily - 7 x weekly - 3 sets - 10 reps ?- Hip Extension with Resistance Loop  - 1 x daily - 7 x weekly - 3 sets - 10 reps ?- Standing Hip Flexion with Resistance Loop  - 1 x daily - 7 x weekly - 3 sets - 10 reps ?- Squat with Chair Touch  - 1 x daily - 7 x weekly - 3 sets - 10 reps ?- Wall Push Up  - 1 x daily - 7 x weekly - 3 sets - 10 reps ?- Heel Raises with Leg Press  - 1 x daily - 7 x weekly - 3 sets - 10 reps ?

## 2021-05-27 ENCOUNTER — Encounter: Payer: Self-pay | Admitting: Pulmonary Disease

## 2021-05-30 ENCOUNTER — Ambulatory Visit (INDEPENDENT_AMBULATORY_CARE_PROVIDER_SITE_OTHER): Payer: Medicare HMO

## 2021-05-30 DIAGNOSIS — I639 Cerebral infarction, unspecified: Secondary | ICD-10-CM | POA: Diagnosis present

## 2021-05-30 DIAGNOSIS — Z952 Presence of prosthetic heart valve: Secondary | ICD-10-CM | POA: Diagnosis not present

## 2021-05-30 DIAGNOSIS — Z5181 Encounter for therapeutic drug level monitoring: Secondary | ICD-10-CM | POA: Diagnosis not present

## 2021-05-30 DIAGNOSIS — Z7901 Long term (current) use of anticoagulants: Secondary | ICD-10-CM

## 2021-05-30 DIAGNOSIS — I63412 Cerebral infarction due to embolism of left middle cerebral artery: Secondary | ICD-10-CM | POA: Insufficient documentation

## 2021-05-30 DIAGNOSIS — Z8673 Personal history of transient ischemic attack (TIA), and cerebral infarction without residual deficits: Secondary | ICD-10-CM | POA: Diagnosis present

## 2021-05-30 HISTORY — DX: Cerebral infarction due to embolism of left middle cerebral artery: I63.412

## 2021-05-30 LAB — POCT INR: INR: 2.9 (ref 2.0–3.0)

## 2021-05-30 NOTE — Patient Instructions (Signed)
Continue taking 1 tablet Daily, except 0.5 tablet Monday, Wednesday and Friday.  INR 6 weeks. ?

## 2021-06-01 ENCOUNTER — Ambulatory Visit: Payer: Medicare HMO | Admitting: Nurse Practitioner

## 2021-06-01 ENCOUNTER — Encounter: Payer: Self-pay | Admitting: Nurse Practitioner

## 2021-06-01 VITALS — BP 118/64 | HR 66 | Temp 98.6°F | Ht 69.0 in | Wt 221.6 lb

## 2021-06-01 DIAGNOSIS — G4733 Obstructive sleep apnea (adult) (pediatric): Secondary | ICD-10-CM

## 2021-06-01 DIAGNOSIS — I10 Essential (primary) hypertension: Secondary | ICD-10-CM | POA: Diagnosis not present

## 2021-06-01 DIAGNOSIS — Z6832 Body mass index (BMI) 32.0-32.9, adult: Secondary | ICD-10-CM | POA: Diagnosis not present

## 2021-06-01 DIAGNOSIS — E6609 Other obesity due to excess calories: Secondary | ICD-10-CM | POA: Diagnosis not present

## 2021-06-01 NOTE — Progress Notes (Signed)
? ?'@Patient'$  ID: Daleen Squibb, male    DOB: 1945-03-03, 76 y.o.   MRN: [id number] ? ?Chief Complaint  ?Patient presents with  ? Follow-up  ?  Here today to go over home sleep study results and to go over treatment options.   ? ? ?Referring provider: ?[name], NP ? ?HPI: ?76 year old male, current remote cigar smoker followed for severe obstructive sleep apnea.  He is patient Dr. Juanetta Gosling and last seen in office on 03/09/2021.  Past medical history significant for allergies, AAS status post AVR, CAD status post CABG, prostate cancer, CKD, ED, hemochromatosis, gout, hypertension, HLD, CVA, DM 2. ? ?TEST/EVENTS:  ?03/06/2012 HST: AHI 18.4, SPO2 low 82% ?10/11/2020 echocardiogram: EF 60 to 65%, G2 DD.  [facility] AVR in place. ?05/24/2021 HST: AHI 41.2, SPO2 low 85% ? ?03/09/2021: OV with Dr. Halford Chessman for sleep consult.  Previously had sleep study in 2014 that showed moderate sleep apnea.  He was started on CPAP, tried several masks and felt like he Getting dried out so he stopped using it.  He was advised by his PCP to have reevaluation for his OSA.  Wife did report at visit that he snores and stops breathing at times.  Also grinds his teeth at night.  Naps pretty much every afternoon and falls asleep easily if TV shows morning.  Reports feeling okay in the morning.  Wakes 1-2 times a night to use the bathroom.  HST ordered for further evaluation. ? ?06/01/2021: Today-follow-up ?Patient presents today with wife for follow-up to discuss home sleep study results which showed severe obstructive sleep apnea.  He is agreeable to restarting CPAP therapy.  He does already have a machine at home ; however it is quite old.  Reports that it does still work but would need settings adjusted.  Is concerned slightly about getting dried out as this was a problem of his in the past.  He feels as though he does sleep relatively well at night; although thinking back, he does wake frequently to adjust himself in the bed.  Sleeps around 9  hours a night.  Denies morning headaches.  Actually reports feeling relatively okay in the morning when he wakes up.  Denies any drowsy driving, sleepwalking, cataplexy, narcolepsy. ? ?No Known Allergies ? ?Immunization History  ?Administered Date(s) Administered  ? Influenza Split 11/25/2010, 11/21/2011  ? Influenza Whole 11/30/2008, 10/13/2009  ? Influenza, High Dose Seasonal PF 09/18/2016, 10/23/2018, 11/12/2019, 11/29/2020  ? Influenza,inj,Quad PF,6+ Mos 12/06/2012, 09/30/2013, 10/01/2014, 10/15/2015  ? Influenza-Unspecified 09/18/2016, 09/30/2017  ? PFIZER(Purple Top)SARS-COV-2 Vaccination 03/26/2019, 04/15/2019, 11/12/2019  ? Pension scheme manager 54yr & up 11/29/2020  ? Pneumococcal Conjugate-13 02/12/2013  ? Pneumococcal Polysaccharide-23 05/19/2011  ? Td 06/01/2000  ? Tdap 03/01/2012  ? Zoster, Live 06/13/2010  ? ? ?Past Medical History:  ?Diagnosis Date  ? Allergy   ? Aortic stenosis   ? Arthritis   ? CAD (coronary artery disease)   ? cabg  ? [place]  ? prostate, skin  ? Chronic kidney disease   ? Complication of anesthesia   ? Erectile dysfunction   ? Heart murmur   ? Hemochromatosis 02/14/2011  ? Hemorrhoids   ? History of gout   ? History of hepatitis 01/31/1972  ? Hyperlipidemia   ? Hypertension   ? Hypertr obst cardiomyop   ? Mechanical heart valve present   ? Manufacturer: [employer]  [place] [id number] Model #: [id number] Card states MRI compatible with 3 teslas  or less.  ? Obesity   ? moderate  ? PONV (postoperative nausea and vomiting)   ? after CABG- slow to wake up  ? Sensorineural hearing loss   ? Sleep apnea   ? Stroke [facility])   ? Old stroke - patient was not aware.  ? Type II or unspecified type diabetes mellitus without mention of complication, not stated as uncontrolled   ? ? ?Tobacco History: ?Social History  ? ?Tobacco Use  ?Smoking Status Light Smoker  ? Types: Cigars  ?Smokeless Tobacco Never  ?Tobacco Comments  ? Pt states he smokes 1 cigar about once  a month. 06/01/2021  ? ?Ready to quit: Not Answered ?Counseling given: Not Answered ?Tobacco comments: Pt states he smokes 1 cigar about once a month. 06/01/2021 ? ? ?Outpatient Medications Prior to Visit  ?Medication Sig Dispense Refill  ? AMBULATORY NON FORMULARY MEDICATION cpap cushions ? ?          AirFit F20 (Size: Large) ?          AirFit F30 (Size: Med) ?  ?Please FAX the prescription to:  [phone] each prn  ? betamethasone dipropionate (DIPROLENE) 0.05 % cream Apply topically 2 (two) times daily as needed. To eczema rash 30 g 1  ? Blood Glucose Monitoring Suppl Supplies MISC Use for monitoring glucose level 100 each 1  ? colchicine 0.6 MG tablet TAKE 2 TABS BY MOUTH NOW AND THEN 1 TAB IN 1 HOUR FOR GOUT. (MAX 3 TABS/24 HRS) (Patient taking differently: Take 0.6 mg by mouth daily as needed (gout).) 9 tablet 1  ? Continuous Blood Gluc Sensor (DEXCOM G6 SENSOR) MISC 1 Device by Does not apply route as directed. 9 each 3  ? Continuous Blood Gluc Transmit (DEXCOM G6 TRANSMITTER) MISC 1 Device by Does not apply route as directed. 1 each 3  ? dapagliflozin propanediol (FARXIGA) 10 MG TABS tablet Take 1 tablet (10 mg total) by mouth daily before breakfast. 90 tablet 3  ? fenofibrate (TRICOR) 145 MG tablet TAKE 1 TABLET BY MOUTH EVERY DAY 90 tablet 0  ? furosemide (LASIX) 20 MG tablet TAKE 1 TABLET BY MOUTH EVERY DAY AS NEEDED FOR SWELLING 90 tablet 0  ? glucose blood (ONETOUCH VERIO) test strip Use as instructed 100 strip 12  ? icosapent Ethyl (VASCEPA) 1 g capsule Take 2 capsules (2 g total) by mouth 2 (two) times daily. 120 capsule 5  ? insulin aspart (NOVOLOG FLEXPEN) 100 UNIT/ML FlexPen Max daily 70 units 75 mL 3  ? Insulin Glargine (BASAGLAR KWIKPEN) 100 UNIT/ML Inject 24 Units into the skin daily. 30 mL 3  ? Insulin Pen Needle 32G X 4 MM MISC 1 Device by Does not apply route in the morning, at noon, in the evening, and at bedtime. 400 each 3  ? metoprolol succinate (TOPROL-XL) 25 MG 24 hr tablet TAKE 1  TABLET BY MOUTH EVERY DAY 90 tablet 1  ? OneTouch Delica Lancets 78L MISC USE AS DIRECTED 100 each 1  ? tacrolimus (PROTOPIC) 0.1 % ointment Apply 1 application topically daily as needed (irritation).    ? triamcinolone ointment (KENALOG) 0.1 % Apply 1 application. topically 2 (two) times daily.    ? warfarin (COUMADIN) 7.5 MG tablet Take up to 1 tablet by mouth daily as directed by coumadin clinic 90 tablet 1  ? rosuvastatin (CRESTOR) 40 MG tablet Take 1 tablet (40 mg total) by mouth daily. (Patient taking differently: Take 40 mg by mouth every evening.) 90 tablet 3  ? ?  No facility-administered medications prior to visit.  ? ? ? ?Review of Systems:  ? ?Constitutional: No weight loss or gain, night sweats, fevers, chills, fatigue, or lassitude. ?HEENT: No headaches, difficulty swallowing, tooth/dental problems, or sore throat. No sneezing, itching, ear ache, nasal congestion, or post nasal drip. + Snoring; hard of hearing ?CV:  No chest pain, orthopnea, PND, swelling in lower extremities, anasarca, dizziness, palpitations, syncope ?Resp: + Witnessed apneic events.  No shortness of breath with exertion or at rest. No excess mucus or change in color of mucus. No productive or non-productive. No hemoptysis. No wheezing.  No chest wall deformity ?Skin: No rash, lesions, ulcerations ?MSK:  No joint pain or swelling.  No decreased range of motion.  No back pain. ?Neuro: No dizziness or lightheadedness.  ?Psych: No depression or anxiety. Mood stable.  ? ? ? ?Physical Exam: ? ?BP 118/64 (BP Location: Left Arm, Patient Position: Sitting, Cuff Size: Normal)   Pulse 66   Temp 98.6 ?F (37 ?C) (Oral)   Ht '5\' 9"'$  (1.753 m)   Wt 221 lb 9.6 oz (100.5 kg)   SpO2 98%   BMI 32.72 kg/m?  ? ?GEN: Pleasant, interactive, well-appearing; obese; in no acute distress. ?HEENT:  Normocephalic and atraumatic. PERRLA. Sclera white. Nasal turbinates pink, moist and patent bilaterally. No rhinorrhea present. Oropharynx pink and moist, without  exudate or edema. No lesions, ulcerations, or postnasal drip.  ?NECK:  Supple w/ fair ROM. No JVD present. Normal carotid impulses w/o bruits. Thyroid symmetrical with no goiter or nodules palpated. No lymph

## 2021-06-01 NOTE — Assessment & Plan Note (Signed)
Healthy weight management advised.  

## 2021-06-01 NOTE — Patient Instructions (Signed)
Restart CPAP auto 5-20 cmH2O every night, minimum of 4-6 hours a night.  ?Change equipment every 30 days or as directed by DME. Wash your tubing with warm soap and water daily, hang to dry. Wash humidifier portion weekly.  ?Be aware of reduced alertness and do not drive or operate heavy machinery if experiencing this or drowsiness. ?Healthy weight management discussed.  ?Avoid or decrease alcohol consumption and medications that make you more sleepy, if possible. ?Notify if persistent daytime sleepiness occurs even with consistent use of CPAP. ? ?We discussed how untreated sleep apnea puts an individual at risk for cardiac arrhthymias, pulm HTN, DM, stroke and increases their risk for daytime accidents. We also briefly reviewed treatment options including weight loss, side sleeping position, oral appliance, CPAP therapy or referral to ENT for possible surgical options ? ?Follow up in 6-8 weeks after starting back on CPAP therapy. If you are using your old machine prior to receiving your new one, please bring SD card with you to your visit. If symptoms do not improve or worsen, please contact office for sooner follow up or seek emergency care. ?

## 2021-06-01 NOTE — Assessment & Plan Note (Signed)
Restart CPAP therapy with auto setting 5 - 20 cmH2O.  Advised that he use saline nasal gel in each nostril at bedtime to help combat dryness.  Advised that we can adjust pressures if he feels like he is still getting dried out.  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  Orders were placed today. ? ?Patient Instructions  ?Restart CPAP auto 5-20 cmH2O every night, minimum of 4-6 hours a night.  ?Change equipment every 30 days or as directed by DME. Wash your tubing with warm soap and water daily, hang to dry. Wash humidifier portion weekly.  ?Be aware of reduced alertness and do not drive or operate heavy machinery if experiencing this or drowsiness. ?Healthy weight management discussed.  ?Avoid or decrease alcohol consumption and medications that make you more sleepy, if possible. ?Notify if persistent daytime sleepiness occurs even with consistent use of CPAP. ? ?We discussed how untreated sleep apnea puts an individual at risk for cardiac arrhthymias, pulm HTN, DM, stroke and increases their risk for daytime accidents. We also briefly reviewed treatment options including weight loss, side sleeping position, oral appliance, CPAP therapy or referral to ENT for possible surgical options ? ?Follow up in 6-8 weeks after starting back on CPAP therapy. If you are using your old machine prior to receiving your new one, please bring SD card with you to your visit. If symptoms do not improve or worsen, please contact office for sooner follow up or seek emergency care. ? ?

## 2021-06-01 NOTE — Progress Notes (Signed)
Reviewed and agree with assessment/plan. ? ? ?Chesley Mires, MD ?[place] ?06/01/2021, 11:42 AM ?Pager:  [phone] ? ?

## 2021-06-01 NOTE — Assessment & Plan Note (Signed)
Well-controlled today.  Follow-up with PCP as scheduled. ?

## 2021-06-03 ENCOUNTER — Encounter: Payer: Self-pay | Admitting: Physical Therapy

## 2021-06-03 ENCOUNTER — Ambulatory Visit: Payer: Medicare HMO | Attending: Neurological Surgery | Admitting: Physical Therapy

## 2021-06-03 DIAGNOSIS — M6281 Muscle weakness (generalized): Secondary | ICD-10-CM | POA: Diagnosis not present

## 2021-06-03 DIAGNOSIS — R262 Difficulty in walking, not elsewhere classified: Secondary | ICD-10-CM | POA: Diagnosis not present

## 2021-06-03 DIAGNOSIS — M545 Low back pain, unspecified: Secondary | ICD-10-CM | POA: Diagnosis not present

## 2021-06-03 DIAGNOSIS — M5416 Radiculopathy, lumbar region: Secondary | ICD-10-CM | POA: Diagnosis not present

## 2021-06-03 NOTE — Patient Instructions (Signed)
Access Code: [email or web address] ?URL: [email or web address] ?Date: 06/03/2021 ?Prepared by: Glenetta Hew ? ?Exercises Added ?- Step Up  - 1 x daily - 7 x weekly - 2-3 sets - 10 reps ?- Lateral Step Up with Counter Support  - 1 x daily - 7 x weekly - 2-3 sets - 10 reps ?- Forward Step Down Touch with Heel  - 1 x daily - 7 x weekly - 2-3 sets - 10 reps ?- Single Leg Balance with Four Way Reach and Rotation  - 1 x daily - 7 x weekly - 2 sets - 10 reps ?- Standing Single Leg Stance with Counter Support  - 1 x daily - 7 x weekly - 3 reps - 30 sec  hold ?

## 2021-06-03 NOTE — Therapy (Signed)
[name] ?[address] ?[place], [place], [address] ?Phone: [phone]   Fax:  [phone] ? ?Physical Therapy Treatment ? ?Patient Details  ?Name: [name] ?MRN: [id number] ?Date of Birth: 05-Feb-1945 ?Referring Provider (PT): [name] ? ? ?Encounter Date: 06/03/2021 ? ? PT End of Session - 06/03/21 1121   ? ? Visit Number 4   ? Number of Visits 6   ? Date for PT Re-Evaluation 06/08/21   ? Authorization Type [employer] MCR   ? Progress Note Due on Visit 6   ? PT Start Time 1105   ? PT Stop Time 1145   ? PT Time Calculation (min) 40 min   ? Activity Tolerance Patient tolerated treatment well   ? Behavior During Therapy [facility] for tasks assessed/performed   ? ?  ?  ? ?  ? ? ?Past Medical History:  ?Diagnosis Date  ? Allergy   ? Aortic stenosis   ? Arthritis   ? CAD (coronary artery disease)   ? cabg  ? Cancer ([place]) 01/30/2009  ? prostate, skin  ? Chronic kidney disease   ? Complication of anesthesia   ? Erectile dysfunction   ? Heart murmur   ? Hemochromatosis 02/14/2011  ? Hemorrhoids   ? History of gout   ? History of hepatitis 01/31/1972  ? Hyperlipidemia   ? Hypertension   ? Hypertr obst cardiomyop   ? Mechanical heart valve present   ? Manufacturer: [employer]   [place] #: [id number]  Model #: [id number]. Card states MRI compatible with 3 teslas or less.  ? Obesity   ? moderate  ? PONV (postoperative nausea and vomiting)   ? after CABG- slow to wake up  ? Sensorineural hearing loss   ? Sleep apnea   ? Stroke [facility])   ? Old stroke - patient was not aware.  ? Type II or unspecified type diabetes mellitus without mention of complication, not stated as uncontrolled   ? ? ?Past Surgical History:  ?Procedure Laterality Date  ? AORTIC VALVE REPLACEMENT  11/14/2003  ? [facility]  ? APPENDECTOMY  1990  ? CHOLECYSTECTOMY  1990  ? CORONARY ARTERY BYPASS GRAFT  10/2003  ? HERNIA REPAIR  1999  ? right, inguinal  ? HERNIA REPAIR  2002  ? left, inguinal   ? HIP SURGERY  2006  ? right hip  ? LUMBAR LAMINECTOMY/DECOMPRESSION MICRODISCECTOMY Bilateral 02/04/2021  ? Procedure: Bilateral Lumbar Two-Three Laminectomy;  Surgeon: Kristeen Miss, MD;  Location: [facility];  Service: Neurosurgery;  Laterality: Bilateral;  3C/RM 20  ? [place]  ? prostate seed implant  3/12  ? TONSILLECTOMY  childhood  ? ? ?There were no vitals filed for this visit. ? ? Subjective Assessment - 06/03/21 1121   ? ? Subjective Pt. arrives today with exercise log, reporting good compliance.   ? Patient Stated Goals get his strength back   ? Currently in Pain? No/denies   ? ?  ?  ? ?  ? ? ? ? ? ? ? ? ? ? ? ? ? ? ? ? ? ? ? ? [facility] Adult PT Treatment/Exercise - 06/03/21 0001   ? ?  ? Ambulation/Gait  ? Ambulation/Gait Yes   ? Ambulation/Gait Assistance 7: Independent   ? Ambulation Distance (Feet) 600 Feet   ? Stairs Yes   ? Stairs Assistance 6: Modified independent (Device/Increase time)   ? Stair Management Technique One  rail Right;Alternating pattern   poor eccentric control on left  ? Number of Stairs 28   ? Height of Stairs 8   ?  ? Exercises  ? Exercises Knee/Hip   ?  ? Lumbar Exercises: Standing  ? Functional Squats 10 reps   ?  ? Knee/Hip Exercises: Standing  ? Lateral Step Up Right;Left;10 reps;Hand Hold: 1   ? Forward Step Up Right;Left;10 reps;Hand Hold: 1   ? Step Down 10 reps;Hand Hold: 1   ? Step Down Limitations dips on step   ? SLS 2 x 30 sec bil, at counter for safety   ? SLS with Vectors foward heel taps 2 x 10 bil   ? ?  ?  ? ?  ? ? ? ? ? ? ? ? ? ? PT Education - 06/03/21 1201   ? ? Education Details HEP update Access Code: [email or web address]   ? Person(s) Educated Patient   ? Methods Explanation;Demonstration;Handout   ? Comprehension Verbalized understanding;Returned demonstration   ? ?  ?  ? ?  ? ? ? PT Short Term Goals - 05/11/21 1123   ? ?  ? PT SHORT TERM GOAL #1  ? Title Patient to be independent with initial HEP.   ? Time 3   ? Period Weeks   ? Status On-going   ? Target  Date 05/18/21   ? ?  ?  ? ?  ? ? ? ? PT Long Term Goals - 06/03/21 1156   ? ?  ? PT LONG TERM GOAL #1  ? Title Patient to be independent with advanced HEP.   ? Time 6   ? Period Weeks   ? Status On-going   ? Target Date 06/08/21   ?  ? PT LONG TERM GOAL #2  ? Title Pt. will be able to walk for 1 mile on uneven surfaces without report of fatigue or instability.   ? Baseline fatigues quickly, difficulty on uneven surfaces   ? Time 6   ? Period Weeks   ? Status On-going   06/03/21- 4K steps/day  ? Target Date 06/08/21   ?  ? PT LONG TERM GOAL #3  ? Title Pt. to demonstrate improved function as demonstrated by FOTO > or = to 60%   ? Baseline 47%   ? Time 6   ? Period Weeks   ? Status On-going   ?  ? PT LONG TERM GOAL #4  ? Title Patient to ascend/descend 13 steps with 1 handrail as needed with reciprocal pattern and good stability.   ? Baseline poor eccentric control descending   ? Time 6   ? Period Weeks   ? Status On-going   ? Target Date 06/08/21   ?  ? PT LONG TERM GOAL #5  ? Title Pt. will demonstrate decreased risk of falls by being able to maintain tandem stance 30 sec bil.   ? Baseline R 5 sec, L 12 sec   ? Time 6   ? Period Weeks   ? Status On-going   ? Target Date 06/08/21   ? ?  ?  ? ?  ? ? ? ? ? ? ? ? Plan - 06/03/21 1151   ? ? Clinical Impression Statement Len reports very good compliance with HEP, has increased daily steps to ~ 4000, and going to [employer].  Today reviewed current exercises and then focused on stairs, which he reports difficulty.  Noted today decreased eccentric control in LLE.  He also demonstrates decreased balance on L side, maintaining SLS x 5-7 sec on L compared to 25 sec on R, so added SLS to HEP as well.  He would benefit from continued skilled therapy.   ? Personal Factors and Comorbidities Age;Comorbidity 3+   ? Comorbidities extremely HOH, T2DM on insulin, on Coumadin for heart valve replacement, recent back surgery, falls, arthritis, history prostate cancer, current tobacco  user   ? PT Frequency Other (comment)   1-2x per week  ? PT Duration 6 weeks   ? PT Treatment/Interventions ADLs/Self Care Home Management;Cryotherapy;Moist Heat;Gait training;Stair training;Functional mobility training;Therapeutic activities;Therapeutic exercise;Balance training;Neuromuscular re-education;Manual techniques;Patient/family education   ? PT Next Visit Plan review HEP if needed; progress WB exercises for LE strengthening; focus on transitioning to gym based exercise program, higher level balance activities   ? Consulted and Agree with Plan of Care Patient   ? ?  ?  ? ?  ? ? ?Patient will benefit from skilled therapeutic intervention in order to improve the following deficits and impairments:  Decreased activity tolerance, Decreased endurance, Decreased range of motion, Decreased strength, Improper body mechanics, Pain, Postural dysfunction, Impaired flexibility, Difficulty walking, Decreased balance ? ?Visit Diagnosis: ?Muscle weakness (generalized) ? ?Difficulty in walking, not elsewhere classified ? ?Low back pain without sciatica, unspecified back pain laterality, unspecified chronicity ? ?Radiculopathy, lumbar region ? ? ? ? ?Problem List ?Patient Active Problem List  ? Diagnosis Date Noted  ? Spinal stenosis 02/05/2021  ? S/P lumbar laminectomy 02/04/2021  ? Lumbar stenosis with neurogenic claudication 02/04/2021  ? Leg weakness, bilateral 01/18/2021  ? Acute low back pain 01/18/2021  ? Cerebrovascular accident (CVA) ([place]) 12/27/2020  ? Memory loss 12/07/2020  ? Type 2 diabetes mellitus with diabetic polyneuropathy, with long-term current use of insulin ([place]) 11/11/2018  ? Type 2 diabetes mellitus with stage 3 chronic kidney disease, with long-term current use of insulin ([place]) 11/11/2018  ? Hx of CABG 11/05/2018  ? Dyslipidemia 11/05/2018  ? Primary osteoarthritis of left shoulder 10/31/2018  ? Long term (current) use of anticoagulants 05/22/2018  ? Hereditary hemochromatosis ([place]) 03/30/2016  ?  Gout 04/09/2015  ? Ventral hernia 03/03/2014  ? History of prostate cancer 11/28/2013  ? OSA (obstructive sleep apnea) 07/12/2012  ? Uncontrolled type 2 diabetes mellitus with hyperglycemia ([place]) 04/27/

## 2021-06-06 NOTE — Telephone Encounter (Signed)
[name], please advise. Thanks ? ? ?Ms [name], ?  ?I have used my C-Pap machine three nights. Best so far is asleep with mask around 10:30 and removal of mask at 4:45. ?  ?I can only find the Humidity setting (need to re-find my User Manual). I remove the mask when it starts releasing oxygen/air at different points around the seal of the mask - the noise can wake my wife up. ?  ?Unfortunately, I only have under-the-nose masks, no over-the-nose (full-face) masks, so I can't compare. ?  ?Just want to keep you abreast of my usage and any corresponding issues. ?  ?thanks, and stay safe ?

## 2021-06-06 NOTE — Telephone Encounter (Signed)
Due to the age of his machine, I am not able to pull a download for it and I believe we are still waiting on him to get his new machine? Please verify this. If he has yet to receive a new machine, has he heard from the DME about when he should anticipate receiving one?  ? ?I can send for him to do a mask fitting at [facility] so he can see if there is another mask that fits him better. Sometimes improper mask fit will cause the leaks so this would be the first step since I can't see a download. Thanks!

## 2021-06-08 ENCOUNTER — Encounter: Payer: Self-pay | Admitting: Physical Therapy

## 2021-06-08 ENCOUNTER — Ambulatory Visit: Payer: Medicare HMO | Admitting: Physical Therapy

## 2021-06-08 DIAGNOSIS — R262 Difficulty in walking, not elsewhere classified: Secondary | ICD-10-CM

## 2021-06-08 DIAGNOSIS — M6281 Muscle weakness (generalized): Secondary | ICD-10-CM

## 2021-06-08 DIAGNOSIS — M545 Low back pain, unspecified: Secondary | ICD-10-CM

## 2021-06-08 DIAGNOSIS — M5416 Radiculopathy, lumbar region: Secondary | ICD-10-CM | POA: Diagnosis not present

## 2021-06-08 NOTE — Therapy (Addendum)
PHYSICAL THERAPY DISCHARGE SUMMARY  Visits from Start of Care: 5  Current functional level related to goals / functional outcomes: Improved strength and functional mobility, all goals met or partially met.    Remaining deficits: Weakness eccentric control R quad   Education / Equipment: HEP  Plan: Patient agrees to discharge.  Patient is being discharged due to meeting the stated rehab goals.  He was placed on 30 day hold due to progress and regular gym attendance and has not returned to therapy during stated interval.    Rennie Natter, PT, DPT 8:41 AM 07/15/2021  [facility] [place], [place], [address] Phone: [phone]   Fax:  [phone]  Physical Therapy Treatment  Patient Details  Name: [name] MRN: [id number] Date of Birth: 09-14-1945 Referring Provider (PT): [name]   Encounter Date: 06/08/2021   PT End of Session - 06/08/21 1014     Visit Number 5    Number of Visits 6    Date for PT Re-Evaluation 06/08/21    Authorization Type [employer] MCR    Progress Note Due on Visit 6    PT Start Time 1015    PT Stop Time 1040    PT Time Calculation (min) 25 min    Activity Tolerance Patient tolerated treatment well    Behavior During Therapy [facility] for tasks assessed/performed             Past Medical History:  Diagnosis Date   Allergy    Aortic stenosis    Arthritis    CAD (coronary artery disease)    cabg   Cancer ([place]) 01/30/2009   prostate, skin   Chronic kidney disease    Complication of anesthesia    Erectile dysfunction    Heart murmur    Hemochromatosis 02/14/2011   Hemorrhoids    History of gout    History of hepatitis 01/31/1972   Hyperlipidemia    Hypertension    Hypertr obst cardiomyop    Mechanical heart valve present    Manufacturer: Sherlean Foot #: [id number]  Model #: [id number]. Card states MRI compatible with 3 teslas or less.    Obesity    moderate   PONV (postoperative nausea and vomiting)    after CABG- slow to wake up   Sensorineural hearing loss    Sleep apnea    Stroke [facility])    Old stroke - patient was not aware.   Type II or unspecified type diabetes mellitus without mention of complication, not stated as uncontrolled     Past Surgical History:  Procedure Laterality Date   AORTIC VALVE REPLACEMENT  11/14/2003   [facility]   APPENDECTOMY  1990   CHOLECYSTECTOMY  1990   CORONARY ARTERY BYPASS GRAFT  10/2003   HERNIA REPAIR  1999   right, inguinal   HERNIA REPAIR  2002   left, inguinal   HIP SURGERY  2006   right hip   LUMBAR LAMINECTOMY/DECOMPRESSION MICRODISCECTOMY Bilateral 02/04/2021   Procedure: Bilateral Lumbar Two-Three Laminectomy;  Surgeon: Kristeen Miss, MD;  Location: [place];  Service: Neurosurgery;  Laterality: Bilateral;  3C/RM 20   PILONIDAL CYST EXCISION  1964   prostate seed implant  3/12   TONSILLECTOMY  childhood    There were no vitals filed for this visit.   Subjective Assessment - 06/08/21 1014     Subjective Gym MWF, HEP T, Thursday, Saturday.  Has started  walking 1 lap at track, and then does steps at bleachers.  Back has not bothered him at all "just this R knee is weak."    Patient Stated Goals get his strength back    Currently in Pain? No/denies                [facility] PT Assessment - 06/08/21 0001       Assessment   Medical Diagnosis M48.062 (ICD-10-CM) - Spinal stenosis, lumbar region with neurogenic claudication    Referring Provider (PT) [name]    Onset Date/Surgical Date 02/04/21    Hand Dominance Right      Precautions   Precautions None      Restrictions   Weight Bearing Restrictions No      Observation/Other Assessments   Focus on Therapeutic Outcomes (FOTO)  lumbar : 83%  Predicted outcome was 60%.                           [facility] PT Treatment/Exercise - 06/08/21 0001       Ambulation/Gait   Stairs Yes     Stairs Assistance 6: Modified independent (Device/Increase time)    Stair Management Technique One rail Right;Alternating pattern    Number of Stairs 28    Height of Stairs 8      Exercises   Exercises Knee/Hip      Knee/Hip Exercises: Machines for Strengthening   Cybex Knee Extension 25# x 10 bil, 5# RLE 2 x 10    Cybex Knee Flexion 5# RLE only 2 x 10      Knee/Hip Exercises: Standing   Stairs yes, see above    SLS x 30 sec bil    Other Standing Knee Exercises review of current exercise program, machines, and progression.    Other Standing Knee Exercises tandem stance x 30 sec bil                       PT Short Term Goals - 06/08/21 1015       PT SHORT TERM GOAL #1   Title Patient to be independent with initial HEP.    Time 3    Period Weeks    Status Achieved    Target Date 05/18/21               PT Long Term Goals - 06/08/21 1016       PT LONG TERM GOAL #1   Title Patient to be independent with advanced HEP.    Time 6    Period Weeks    Status Achieved   06/08/21- reviewed.  Performs HEP 3x/week and goes to gym 3x/week in addition to starting to walk track with son.   Target Date 06/08/21      PT LONG TERM GOAL #2   Title Pt. will be able to walk for 1 mile on uneven surfaces without report of fatigue or instability.    Baseline fatigues quickly, difficulty on uneven surfaces    Time 6    Period Weeks    Status On-going   06/03/21- 4K steps/day  06/08/21- progress, 1/4 mile before needs to rest.   Target Date 06/08/21      PT LONG TERM GOAL #3   Title Pt. to demonstrate improved function as demonstrated by FOTO > or = to 60%    Baseline 47%    Time 6    Period Weeks  Status Achieved   06/08/21- 83%     PT LONG TERM GOAL #4   Title Patient to ascend/descend 13 steps with 1 handrail as needed with reciprocal pattern and good stability.    Baseline poor eccentric control descending    Time 6    Period Weeks    Status Partially Met    06/08/21- eccentric weakness R quad with descent.   Target Date 06/08/21      PT LONG TERM GOAL #5   Title Pt. will demonstrate decreased risk of falls by being able to maintain tandem stance 30 sec bil.    Baseline R 5 sec, L 12 sec    Time 6    Period Weeks    Status Achieved   5/10- 30 sec bil   Target Date 06/08/21                   Plan - 06/08/21 1049     Clinical Impression Statement Len has made very good progress and met LTG #1, #3, and #5.  He reports no back pain and no limitation from back, and his FOTO has improved to 83% which is above expected outcome.  He still demonstrates weakness in R quad, especially with descending stairs, but is independent with HEP and gym exercises to address.  Based on progress and transition to gym based exercise program, placing on 30 day hold today to discharge if does not require further skilled therapy.  He will send this PT periodic updates on exercises through [facility] and will progress as needed.    Personal Factors and Comorbidities Age;Comorbidity 3+    Comorbidities extremely HOH, T2DM on insulin, on Coumadin for heart valve replacement, recent back surgery, falls, arthritis, history prostate cancer, current tobacco user    PT Frequency Other (comment)   1-2x per week   PT Duration 6 weeks    PT Treatment/Interventions ADLs/Self Care Home Management;Cryotherapy;Moist Heat;Gait training;Stair training;Functional mobility training;Therapeutic activities;Therapeutic exercise;Balance training;Neuromuscular re-education;Manual techniques;Patient/family education    PT Next Visit Plan 30 day hold    Consulted and Agree with Plan of Care Patient             Patient will benefit from skilled therapeutic intervention in order to improve the following deficits and impairments:  Decreased activity tolerance, Decreased endurance, Decreased range of motion, Decreased strength, Improper body mechanics, Pain, Postural dysfunction, Impaired  flexibility, Difficulty walking, Decreased balance  Visit Diagnosis: Muscle weakness (generalized)  Difficulty in walking, not elsewhere classified  Low back pain without sciatica, unspecified back pain laterality, unspecified chronicity     Problem List Patient Active Problem List   Diagnosis Date Noted   Spinal stenosis 02/05/2021   S/P lumbar laminectomy 02/04/2021   Lumbar stenosis with neurogenic claudication 02/04/2021   Leg weakness, bilateral 01/18/2021   Acute low back pain 01/18/2021   Cerebrovascular accident (CVA) ([place]) 12/27/2020   Memory loss 12/07/2020   Type 2 diabetes mellitus with diabetic polyneuropathy, with long-term current use of insulin ([place]) 11/11/2018   Type 2 diabetes mellitus with stage 3 chronic kidney disease, with long-term current use of insulin ([place]) 11/11/2018   Hx of CABG 11/05/2018   Dyslipidemia 11/05/2018   Primary osteoarthritis of left shoulder 10/31/2018   Long term (current) use of anticoagulants 05/22/2018   Hereditary hemochromatosis ([place]) 03/30/2016   Gout 04/09/2015   Ventral hernia 03/03/2014   History of prostate cancer 11/28/2013   OSA (obstructive sleep apnea) 07/12/2012   Uncontrolled type 2 diabetes mellitus  with hyperglycemia ([place]) 05/26/2012   Hemochromatosis 02/14/2011   Actinic keratosis 02/14/2011   Hypertriglyceridemia 06/13/2010   HEMORRHOIDS, EXTERNAL 03/21/2010   History of aortic valve replacement 10/13/2009   ADENOCARCINOMA, PROSTATE 06/17/2009   TOBACCO USER 06/16/2009   Sensorineural hearing loss (SN[facility]) 06/16/2009   Essential hypertension 06/16/2009   CORONARY ATHEROSCLEROSIS NATIVE CORONARY ARTERY 06/15/2009   ERECTILE DYSFUNCTION, ORGANIC 05/18/2009   Hypertrophic obstructive cardiomyopathy ([place]) 05/06/2008   OBESITY, MODERATE 04/29/2008    Rennie Natter, PT, DPT  06/08/2021, 10:58 AM  [facility] [address]  [place],  [place], [address] Phone: [phone]   Fax:  [phone]  Name: [name] MRN: [id number] Date of Birth: 17-Jan-1946

## 2021-06-10 ENCOUNTER — Telehealth: Payer: Self-pay | Admitting: Nurse Practitioner

## 2021-06-10 NOTE — Telephone Encounter (Signed)
Waiting to hear from patient on update on cpap machine from [employer]. Patient states he will update [name] when he hears back from company! Nothing needed at this time  ?

## 2021-06-10 NOTE — Telephone Encounter (Signed)
I'm not sure if something needs to be done with this message.  ?

## 2021-06-13 ENCOUNTER — Encounter: Payer: Self-pay | Admitting: Family

## 2021-06-14 ENCOUNTER — Encounter: Payer: Self-pay | Admitting: Internal Medicine

## 2021-06-15 NOTE — Telephone Encounter (Signed)
The order was sent to [employer] so the insurance company would have to contact them in regards to an authorization or anything to do with the order.  ?

## 2021-06-15 NOTE — Telephone Encounter (Signed)
Mychart message sent by pt: ?[name] "[name]"  P [facility] (supporting Doolittle, Karie Schwalbe, NP) 2 hours ago (8:35 AM)  ? ?LD ?Ms Belenda Cruise (if I may call you Belenda Cruise), ?  ?I called the C-Pap people yesterday afternoon for a chat. ?We go for the NEW C-Pap machine Monday morning. ?Thank you for your help in making that happen. ?  ?There has to be a Volume control somewhere on this old one - I will ask on Monday. ?     Last night: 10:30 - asleep ?                             12:30 - mask OFF ?                                2:30 - mask ON ?                                4:30 - mask OFF ?the air trying to escape the mask makes enough noise to wake my wife up. There has to be a Volume control! ?  ?thanks and stay safe, ?[name] ?  ?Ps. I will bring in my SD card (old C-Pap) on Friday - do I need an appointment? ?       and,  obviously, I will need it back same day. ? ? ? ?Joellen Jersey, please advise. ?

## 2021-06-15 NOTE — Telephone Encounter (Signed)
Happy you will be getting your new CPAP. Please start using this one nightly, so we can obtain downloads at your follow ups. You can keep your old one for travel or backup, like we previously discussed. You can bring your SD card in Friday and they can download the information and give it back to you. You do not need an appointment. I think we are scheduled for 6/14. Since you are getting your new CPAP Monday, let's push this visit out to the end of June so we can do a compliance check within 31-90 days of starting the new machine for insurance. Raquel Sarna, can you please reschedule him? Thanks.

## 2021-06-17 ENCOUNTER — Telehealth: Payer: Self-pay | Admitting: Nurse Practitioner

## 2021-06-17 NOTE — Telephone Encounter (Signed)
Barnet Pall completed download- download and SD card placed upfront for patient to pick up.

## 2021-06-17 NOTE — Telephone Encounter (Signed)
Called and spoke with patient's wife to let her know that SD card and compliance report are ready for pick up. She expressed understanding and said she would let him know. Nothing further needed at this time,.

## 2021-06-17 NOTE — Telephone Encounter (Signed)
Patient dropped off SD Card for a download and will be back at 3pm to pick up download and card.   Please advise.

## 2021-06-20 DIAGNOSIS — R0681 Apnea, not elsewhere classified: Secondary | ICD-10-CM | POA: Diagnosis not present

## 2021-06-20 DIAGNOSIS — R0683 Snoring: Secondary | ICD-10-CM | POA: Diagnosis not present

## 2021-06-20 DIAGNOSIS — I10 Essential (primary) hypertension: Secondary | ICD-10-CM | POA: Diagnosis not present

## 2021-06-20 DIAGNOSIS — E119 Type 2 diabetes mellitus without complications: Secondary | ICD-10-CM | POA: Diagnosis not present

## 2021-06-20 DIAGNOSIS — G4733 Obstructive sleep apnea (adult) (pediatric): Secondary | ICD-10-CM | POA: Diagnosis not present

## 2021-06-24 ENCOUNTER — Emergency Department (HOSPITAL_BASED_OUTPATIENT_CLINIC_OR_DEPARTMENT_OTHER): Payer: Medicare HMO

## 2021-06-24 ENCOUNTER — Inpatient Hospital Stay (HOSPITAL_BASED_OUTPATIENT_CLINIC_OR_DEPARTMENT_OTHER)
Admission: EM | Admit: 2021-06-24 | Discharge: 2021-06-26 | DRG: 065 | Disposition: A | Discharge status: Home Health | Payer: Medicare HMO | Attending: Internal Medicine | Admitting: Internal Medicine

## 2021-06-24 ENCOUNTER — Encounter (HOSPITAL_BASED_OUTPATIENT_CLINIC_OR_DEPARTMENT_OTHER): Payer: Self-pay | Admitting: Emergency Medicine

## 2021-06-24 ENCOUNTER — Other Ambulatory Visit: Payer: Self-pay

## 2021-06-24 DIAGNOSIS — R29704 NIHSS score 4: Secondary | ICD-10-CM | POA: Diagnosis not present

## 2021-06-24 DIAGNOSIS — Z952 Presence of prosthetic heart valve: Secondary | ICD-10-CM

## 2021-06-24 DIAGNOSIS — I129 Hypertensive chronic kidney disease with stage 1 through stage 4 chronic kidney disease, or unspecified chronic kidney disease: Secondary | ICD-10-CM | POA: Diagnosis present

## 2021-06-24 DIAGNOSIS — R29703 NIHSS score 3: Secondary | ICD-10-CM | POA: Diagnosis present

## 2021-06-24 DIAGNOSIS — Z7984 Long term (current) use of oral hypoglycemic drugs: Secondary | ICD-10-CM

## 2021-06-24 DIAGNOSIS — F17290 Nicotine dependence, other tobacco product, uncomplicated: Secondary | ICD-10-CM | POA: Diagnosis present

## 2021-06-24 DIAGNOSIS — E1122 Type 2 diabetes mellitus with diabetic chronic kidney disease: Secondary | ICD-10-CM | POA: Diagnosis present

## 2021-06-24 DIAGNOSIS — G370 Diffuse sclerosis of central nervous system: Secondary | ICD-10-CM | POA: Diagnosis present

## 2021-06-24 DIAGNOSIS — Z6832 Body mass index (BMI) 32.0-32.9, adult: Secondary | ICD-10-CM

## 2021-06-24 DIAGNOSIS — E785 Hyperlipidemia, unspecified: Secondary | ICD-10-CM | POA: Diagnosis present

## 2021-06-24 DIAGNOSIS — I63412 Cerebral infarction due to embolism of left middle cerebral artery: Secondary | ICD-10-CM | POA: Diagnosis not present

## 2021-06-24 DIAGNOSIS — M1990 Unspecified osteoarthritis, unspecified site: Secondary | ICD-10-CM | POA: Diagnosis present

## 2021-06-24 DIAGNOSIS — R4701 Aphasia: Secondary | ICD-10-CM | POA: Diagnosis not present

## 2021-06-24 DIAGNOSIS — E669 Obesity, unspecified: Secondary | ICD-10-CM | POA: Diagnosis present

## 2021-06-24 DIAGNOSIS — I639 Cerebral infarction, unspecified: Secondary | ICD-10-CM | POA: Diagnosis present

## 2021-06-24 DIAGNOSIS — R29810 Facial weakness: Secondary | ICD-10-CM | POA: Diagnosis not present

## 2021-06-24 DIAGNOSIS — N183 Chronic kidney disease, stage 3 unspecified: Secondary | ICD-10-CM | POA: Diagnosis present

## 2021-06-24 DIAGNOSIS — Z79899 Other long term (current) drug therapy: Secondary | ICD-10-CM

## 2021-06-24 DIAGNOSIS — Z951 Presence of aortocoronary bypass graft: Secondary | ICD-10-CM

## 2021-06-24 DIAGNOSIS — I2510 Atherosclerotic heart disease of native coronary artery without angina pectoris: Secondary | ICD-10-CM | POA: Diagnosis present

## 2021-06-24 DIAGNOSIS — G4733 Obstructive sleep apnea (adult) (pediatric): Secondary | ICD-10-CM | POA: Diagnosis present

## 2021-06-24 DIAGNOSIS — Z20822 Contact with and (suspected) exposure to covid-19: Secondary | ICD-10-CM | POA: Diagnosis present

## 2021-06-24 DIAGNOSIS — E876 Hypokalemia: Secondary | ICD-10-CM | POA: Diagnosis present

## 2021-06-24 DIAGNOSIS — Z7901 Long term (current) use of anticoagulants: Secondary | ICD-10-CM

## 2021-06-24 DIAGNOSIS — N179 Acute kidney failure, unspecified: Secondary | ICD-10-CM | POA: Diagnosis present

## 2021-06-24 DIAGNOSIS — Z8673 Personal history of transient ischemic attack (TIA), and cerebral infarction without residual deficits: Secondary | ICD-10-CM

## 2021-06-24 DIAGNOSIS — Z794 Long term (current) use of insulin: Secondary | ICD-10-CM

## 2021-06-24 LAB — COMPREHENSIVE METABOLIC PANEL
ALT: 24 U/L (ref 0–44)
AST: 32 U/L (ref 15–41)
Albumin: 4.1 g/dL (ref 3.5–5.0)
Alkaline Phosphatase: 56 U/L (ref 38–126)
Anion gap: 9 (ref 5–15)
BUN: 30 mg/dL — ABNORMAL HIGH (ref 8–23)
CO2: 25 mmol/L (ref 22–32)
Calcium: 9 mg/dL (ref 8.9–10.3)
Chloride: 103 mmol/L (ref 98–111)
Creatinine, Ser: 1.5 mg/dL — ABNORMAL HIGH (ref 0.61–1.24)
GFR, Estimated: 48 mL/min — ABNORMAL LOW (ref >60)
Glucose, Bld: 182 mg/dL — ABNORMAL HIGH (ref 70–99)
Potassium: 3.5 mmol/L (ref 3.5–5.1)
Sodium: 137 mmol/L (ref 135–145)
Total Bilirubin: 0.5 mg/dL (ref 0.3–1.2)
Total Protein: 7.9 g/dL (ref 6.5–8.1)

## 2021-06-24 LAB — RAPID URINE DRUG SCREEN, HOSP PERFORMED
Amphetamines: NOT DETECTED
Barbiturates: NOT DETECTED
Benzodiazepines: NOT DETECTED
Cocaine: NOT DETECTED
Opiates: NOT DETECTED
Tetrahydrocannabinol: NOT DETECTED

## 2021-06-24 LAB — URINALYSIS, ROUTINE W REFLEX MICROSCOPIC
Bilirubin Urine: NEGATIVE
Glucose, UA: >=500 mg/dL — AB
Hgb urine dipstick: NEGATIVE
Ketones, ur: NEGATIVE mg/dL
Leukocytes,Ua: NEGATIVE
Nitrite: NEGATIVE
Protein, ur: NEGATIVE mg/dL
Specific Gravity, Urine: 1.015 (ref 1.005–1.030)
pH: 7 (ref 5.0–8.0)

## 2021-06-24 LAB — DIFFERENTIAL
Abs Immature Granulocytes: 0.03 10*3/uL (ref 0.00–0.07)
Basophils Absolute: 0.1 10*3/uL (ref 0.0–0.1)
Basophils Relative: 1 %
Eosinophils Absolute: 0.2 10*3/uL (ref 0.0–0.5)
Eosinophils Relative: 2 %
Immature Granulocytes: 0 %
Lymphocytes Relative: 20 %
Lymphs Abs: 1.7 10*3/uL (ref 0.7–4.0)
Monocytes Absolute: 0.7 10*3/uL (ref 0.1–1.0)
Monocytes Relative: 8 %
Neutro Abs: 5.8 10*3/uL (ref 1.7–7.7)
Neutrophils Relative %: 69 %

## 2021-06-24 LAB — URINALYSIS, MICROSCOPIC (REFLEX)

## 2021-06-24 LAB — RESP PANEL BY RT-PCR (FLU A&B, COVID) ARPGX2
Influenza A by PCR: NEGATIVE
Influenza B by PCR: NEGATIVE
SARS Coronavirus 2 by RT PCR: NEGATIVE

## 2021-06-24 LAB — CBC
HCT: 44.3 % (ref 39.0–52.0)
Hemoglobin: 15.2 g/dL (ref 13.0–17.0)
MCH: 30.1 pg (ref 26.0–34.0)
MCHC: 34.3 g/dL (ref 30.0–36.0)
MCV: 87.7 fL (ref 80.0–100.0)
Platelets: 235 10*3/uL (ref 150–400)
RBC: 5.05 MIL/uL (ref 4.22–5.81)
RDW: 13.5 % (ref 11.5–15.5)
WBC: 8.4 10*3/uL (ref 4.0–10.5)
nRBC: 0 % (ref 0.0–0.2)

## 2021-06-24 LAB — PROTIME-INR
INR: 2.7 — ABNORMAL HIGH (ref 0.8–1.2)
Prothrombin Time: 28.1 seconds — ABNORMAL HIGH (ref 11.4–15.2)

## 2021-06-24 LAB — APTT: aPTT: 33 seconds (ref 24–36)

## 2021-06-24 LAB — ETHANOL: Alcohol, Ethyl (B): <10 mg/dL (ref <10)

## 2021-06-24 LAB — CBG MONITORING, ED: Glucose-Capillary: 172 mg/dL — ABNORMAL HIGH (ref 70–99)

## 2021-06-24 MED ORDER — IOHEXOL 350 MG/ML SOLN
100.0000 mL | Freq: Once | INTRAVENOUS | Status: AC | PRN
Start: 2021-06-24 — End: 2021-06-24
  Administered 2021-06-24: 100 mL via INTRAVENOUS

## 2021-06-24 MED ORDER — LABETALOL HCL 5 MG/ML IV SOLN
INTRAVENOUS | Status: AC
Start: 2021-06-24 — End: 2021-06-25
  Filled 2021-06-24: qty 4

## 2021-06-24 MED ORDER — TENECTEPLASE FOR STROKE
PACK | INTRAVENOUS | Status: AC
  Filled 2021-06-24: qty 10

## 2021-06-24 NOTE — ED Notes (Signed)
Pt. Has had previous stroke with no clinical residual.

## 2021-06-24 NOTE — ED Triage Notes (Signed)
Per pt family she woke up about 30 min ago and pt was not able to complete sentences. Was normal about 1730 this afternoon. Pt family states face does not look normal.

## 2021-06-24 NOTE — ED Notes (Signed)
Pt. Speech is off at times with words he cant think of .

## 2021-06-24 NOTE — Progress Notes (Signed)
Code stroke activated at 84. Dr Wyvonnia Dusky at bedside. Pt back from CT at 2002. Dr Adrian Blackwater on screen at 2005

## 2021-06-24 NOTE — Consult Note (Addendum)
Initial Consultation Note  [facility] Consult Services   Patient Name:   [name], [name] Date of Birth:   01/26/1946 Identification Number:   MRN - [id number] Date of Service:   06/24/2021 20:02:14  Diagnosis:       R47.01 - Aphasia  Impression:      The patient is a very pleasant 76 year old man. He has a past medical history significant for diabetes, chemical aortic valve, hypertension, gout. He is on Coumadin.    He was last seen well by his wife at approximately 5 p.m. When she next saw him, he was having a hard time getting his words out. The patient has not been experiencing any facial droop or any weakness of his extremities.    His INR is greater than 1.7 (2.7). He does not qualify for thrombolytics. There is no evidence of hemorrhage on his CT head. CT angiogram of the head and the neck has also been performed. The formal radiology read is pending. I do not appreciate any evidence of a large vessel occlusion on my preliminary review of the images.    I would recommend admission to the hospital with an MRI scan of the brain, a transthoracic echocardiogram, lipid panel, telemetry monitoring. I would also recommend allowing for permissive hypertension. I would recommend holding his Coumadin for the time being.    Further neurology recommendations can be made based on the results of his workup.    Thank you for allowing me to participate in his care.  Our recommendations are outlined below.  Recommendations:        Stroke/Telemetry Floor       Neuro Checks       Bedside Swallow Eval       DVT Prophylaxis       IV Fluids, Normal Saline       Head of Bed 30 Degrees       Euglycemia and Avoid Hyperthermia (PRN Acetaminophen)       Hold Anticoagulation for Now       Antihypertensives PRN if Blood pressure is greater than 220/120 or there is a concern for End organ damage/contraindications for permissive HTN. If blood pressure is greater than 220/120 give  labetalol PO or IV or Vasotec IV with a goal of 15% reduction in BP during the first 24 hours.   Sign Out:       Discussed with Emergency Department Provider    ------------------------------------------------------------------------------  Advanced Imaging: CTA Head and Neck Completed.  LVO:No  Patient doesn't meet criteria for emergent NIR consideration   Metrics: Last Known Well: 06/24/2021 17:00:00 [facility] Notification Time: 06/24/2021 20:02:14 Arrival Time: 06/24/2021 19:24:00 Stamp Time: 06/24/2021 20:02:14 Initial Response Time: 06/24/2021 20:04:55 Symptoms: speech abnormality. Initial patient interaction: 06/24/2021 20:08:11 NIHSS Assessment Completed: 06/24/2021 99:83:38 Patient is not a candidate for Thrombolytic. Thrombolytic Medical Decision: 06/24/2021 20:08:43 Patient was not deemed candidate for Thrombolytic because of following reasons: Coagulopathy.  CT head showed no acute hemorrhage or acute core infarct. IMPRESSION: 1. No acute intracranial process. 2. ASPECTS is 10  Primary Provider Notified of Diagnostic Impression and Management Plan on: 06/24/2021 20:29:32    ------------------------------------------------------------------------------  History of Present Illness: Patient is a 76 year old Male.  Patient was brought by EMS for symptoms of speech abnormality. The patient is a very pleasant 76 year old man with a history of mechanical aortic valve on Coumadin, diabetes, hypertension. His wife last saw him while at approximately 5 p.m.  He has been experiencing difficulty with his speech. He  knows what he wants to say but cannot SayIt.  He does have a previous history of strokes found radiographically on imaging. He does not have any residual neurological deficits. He does not generally need a cane or a walker for ambulation. He had back surgery in January.    Past Medical History:      Hypertension      Diabetes Mellitus       Stroke [place] PMH:  Mechanical aortic valve  Medications:  Anticoagulant use:  Yes Coumadin No Antiplatelet use Reviewed EMR for current medications  Allergies:  Reviewed  Social History: Drug Use: No  Family History:  There is no family history of premature cerebrovascular disease pertinent to this consultation  ROS : 14 Points Review of Systems was performed and was negative except mentioned in HPI.  Past Surgical History: There Is No Surgical History Contributory To Today's Visit    Examination: BP(192/106), Pulse(74), 1A: Level of Consciousness - Alert; keenly responsive + 0 1B: Ask Month and Age - Both Questions Right + 0 1C: Blink Eyes & Squeeze Hands - Performs Both Tasks + 0 2: Test Horizontal Extraocular Movements - Normal + 0 3: Test Visual Fields - No Visual Loss + 0 4: Test Facial Palsy (Use Grimace if Obtunded) - Normal symmetry + 0 5A: Test Left Arm Motor Drift - No Drift for 10 Seconds + 0 5B: Test Right Arm Motor Drift - No Drift for 10 Seconds + 0 6A: Test Left Leg Motor Drift - No Drift for 5 Seconds + 0 6B: Test Right Leg Motor Drift - No Drift for 5 Seconds + 0 7: Test Limb Ataxia (FNF/Heel-Shin) - No Ataxia + 0 8: Test Sensation - Normal; No sensory loss + 0 9: Test Language/Aphasia - Severe Aphasia: Fragmentary Expression, Inference Needed, Cannot Identify Materials + 2 10: Test Dysarthria - Normal + 0 11: Test Extinction/Inattention - No abnormality + 0  NIHSS Score: 2   Pre-Morbid Modified Rankin Scale: 1 Points = No significant disability despite symptoms; able to carry out all usual duties and activities   Patient/Family was informed the Neurology Consult would occur via TeleHealth consult by way of interactive audio and video telecommunications and consented to receiving care in this manner.   Patient is being evaluated for possible acute neurologic impairment and high probability of imminent or life-threatening deterioration. I spent  total of 30 minutes providing care to this patient, including time for face to face visit via telemedicine, review of medical records, imaging studies and discussion of findings with providers, the patient and/or family.   Dr Clair Gulling   [facility] [phone]   Case [id number]   ADDENDUM: CTA with no LVO noted. IMPRESSION: 1. No intracranial large vessel occlusion. Moderate to severe narrowing of the right cavernous carotid and moderate narrowing in the right supraclinoid carotid, left cavernous carotid, and distal left V4. 2.  No hemodynamically significant stenosis in the neck. 3. Diffuse osseous sclerosis, which is nonspecific but can be seen in the setting of renal osteodystrophy, Paget's disease, and diffuse metastatic disease, among other conditions.

## 2021-06-24 NOTE — ED Provider Notes (Signed)
[facility] EMERGENCY DEPARTMENT Provider Note   CSN: [id number] Arrival date & time: 06/24/21  1924     History  Chief Complaint  Patient presents with   Aphasia    [name] is a 76 y.o. male.  Patient with a history of diabetes, mechanical aortic valve on Coumadin, hypertension, gout presenting with difficulty speaking and slurring his words.  Wife at bedside reports his last seen normal was approximately 5 PM.  Patient has difficulty getting his words out and seems to be slurring his words.  He denies any headache.  He denies any chest pain or shortness of breath.  Denies any weakness to his arms or legs, numbness or tingling.  No fever or cough. Denies any head trauma.  The history is provided by the patient and a relative. The history is limited by the condition of the patient.      Home Medications Prior to Admission medications   Medication Sig Start Date End Date Taking? Authorizing Provider  AMBULATORY NON FORMULARY MEDICATION cpap cushions            AirFit F20 (Size: Large)           AirFit F30 (Size: Med)   Please FAX the prescription to:  [phone] 02/10/20   [name], NP  betamethasone dipropionate (DIPROLENE) 0.05 % cream Apply topically 2 (two) times daily as needed. To eczema rash 09/06/18   [name], NP  Blood Glucose Monitoring Suppl Supplies MISC Use for monitoring glucose level 08/05/18   [name], NP  colchicine 0.6 MG tablet TAKE 2 TABS BY MOUTH NOW AND THEN 1 TAB IN 1 HOUR FOR GOUT. (MAX 3 TABS/24 HRS) Patient taking differently: Take 0.6 mg by mouth daily as needed (gout). 12/07/20   [name], NP  Continuous Blood Gluc Sensor (DEXCOM G6 SENSOR) MISC 1 Device by Does not apply route as directed. 05/18/20   Shamleffer, Melanie Crazier, MD  Continuous Blood Gluc Transmit (DEXCOM G6 TRANSMITTER) MISC 1 Device by Does not apply route as directed. 05/18/20   Shamleffer, Melanie Crazier, MD   dapagliflozin propanediol (FARXIGA) 10 MG TABS tablet Take 1 tablet (10 mg total) by mouth daily before breakfast. 04/26/21   Shamleffer, Melanie Crazier, MD  fenofibrate (TRICOR) 145 MG tablet TAKE 1 TABLET BY MOUTH EVERY DAY 04/17/21   [name], NP  furosemide (LASIX) 20 MG tablet TAKE 1 TABLET BY MOUTH EVERY DAY AS NEEDED FOR SWELLING 04/17/21   [name], NP  glucose blood (ONETOUCH VERIO) test strip Use as instructed 09/15/20   [name], NP  icosapent Ethyl (VASCEPA) 1 g capsule Take 2 capsules (2 g total) by mouth 2 (two) times daily. 04/20/21   Lelon Perla, MD  insulin aspart (NOVOLOG FLEXPEN) 100 UNIT/ML FlexPen Max daily 70 units 04/26/21   Shamleffer, Melanie Crazier, MD  Insulin Glargine (BASAGLAR KWIKPEN) 100 UNIT/ML Inject 24 Units into the skin daily. 04/26/21   Shamleffer, Melanie Crazier, MD  Insulin Pen Needle 32G X 4 MM MISC 1 Device by Does not apply route in the morning, at noon, in the evening, and at bedtime. 04/26/21   Shamleffer, Melanie Crazier, MD  metoprolol succinate (TOPROL-XL) 25 MG 24 hr tablet TAKE 1 TABLET BY MOUTH EVERY DAY 01/07/21   [name], NP  OneTouch Delica Lancets 55D MISC USE AS DIRECTED 09/15/20   [name], NP  rosuvastatin (CRESTOR) 40 MG tablet Take 1 tablet (40 mg total) by mouth daily. Patient taking differently: Take 40  mg by mouth every evening. 12/06/20 03/06/21  Lelon Perla, MD  tacrolimus (PROTOPIC) 0.1 % ointment Apply 1 application topically daily as needed (irritation). 11/29/20   [provider]  triamcinolone ointment (KENALOG) 0.1 % Apply 1 application. topically 2 (two) times daily. 12/06/20   [provider]  warfarin (COUMADIN) 7.5 MG tablet Take up to 1 tablet by mouth daily as directed by coumadin clinic 04/27/21   Lelon Perla, MD      Allergies    Patient has no known allergies.    Review of Systems   Review of Systems  Unable to perform ROS: Acuity of  condition   Physical Exam Updated Vital Signs BP (!) 192/106 (BP Location: Right Arm)   Pulse 75   Resp 19   Ht '5\' 10"'$  (1.778 m)   Wt 97.5 kg   SpO2 97%   BMI 30.85 kg/m  Physical Exam Vitals and nursing note reviewed.  Constitutional:      General: He is not in acute distress.    Appearance: He is well-developed.  HENT:     Head: Normocephalic and atraumatic.     Mouth/Throat:     Pharynx: No oropharyngeal exudate.  Eyes:     Conjunctiva/sclera: Conjunctivae normal.     Pupils: Pupils are equal, round, and reactive to light.  Neck:     Comments: No meningismus. Cardiovascular:     Rate and Rhythm: Normal rate and regular rhythm.     Heart sounds: Normal heart sounds. No murmur heard. Pulmonary:     Effort: Pulmonary effort is normal. No respiratory distress.     Breath sounds: Normal breath sounds.  Abdominal:     Palpations: Abdomen is soft.     Tenderness: There is no abdominal tenderness. There is no guarding or rebound.  Musculoskeletal:        General: No tenderness. Normal range of motion.     Cervical back: Normal range of motion and neck supple.  Skin:    General: Skin is warm.  Neurological:     Mental Status: He is alert and oriented to person, place, and time.     Cranial Nerves: No cranial nerve deficit.     Motor: No abnormal muscle tone.     Coordination: Coordination normal.     Comments: Slurred speech, expressive aphasia with difficulty getting his words out.  No facial droop.  5/5 strength throughout, no ataxia on finger-to-nose.   Psychiatric:        Behavior: Behavior normal.    ED Results / Procedures / Treatments   Labs (all labs ordered are listed, but only abnormal results are displayed) Labs Reviewed  PROTIME-INR - Abnormal; Notable for the following components:      Result Value   Prothrombin Time 28.1 (*)    INR 2.7 (*)    All other components within normal limits  COMPREHENSIVE METABOLIC PANEL - Abnormal; Notable for the  following components:   Glucose, Bld 182 (*)    BUN 30 (*)    Creatinine, Ser 1.50 (*)    GFR, Estimated 48 (*)    All other components within normal limits  URINALYSIS, ROUTINE W REFLEX MICROSCOPIC - Abnormal; Notable for the following components:   Glucose, UA >=500 (*)    All other components within normal limits  URINALYSIS, MICROSCOPIC (REFLEX) - Abnormal; Notable for the following components:   Bacteria, UA RARE (*)    All other components within normal limits  CBG MONITORING, ED -  Abnormal; Notable for the following components:   Glucose-Capillary 172 (*)    All other components within normal limits  RESP PANEL BY RT-PCR (FLU A&B, COVID) ARPGX2  ETHANOL  APTT  CBC  DIFFERENTIAL  RAPID URINE DRUG SCREEN, HOSP PERFORMED    EKG EKG Interpretation  Date/Time:  Friday Jun 24 2021 19:34:46 EDT Ventricular Rate:  73 PR Interval:  207 QRS Duration: 156 QT Interval:  430 QTC Calculation: 474 R Axis:   57 Text Interpretation: Sinus rhythm Left atrial enlargement IVCD, consider atypical LBBB No significant change was found Confirmed by Ezequiel Essex [phone]) on 06/24/2021 7:52:51 PM  Radiology CT Angio Head W or Wo Contrast  Result Date: 06/24/2021 CLINICAL DATA:  Aphasia, facial droop, stroke suspected EXAM: CT ANGIOGRAPHY HEAD AND NECK TECHNIQUE: Multidetector CT imaging of the head and neck was performed using the standard protocol during bolus administration of intravenous contrast. Multiplanar CT image reconstructions and MIPs were obtained to evaluate the vascular anatomy. Carotid stenosis measurements (when applicable) are obtained utilizing NASCET criteria, using the distal internal carotid diameter as the denominator. RADIATION DOSE REDUCTION: This exam was performed according to the departmental dose-optimization program which includes automated exposure control, adjustment of the mA and/or kV according to patient size and/or use of iterative reconstruction technique.  CONTRAST:  112m OMNIPAQUE IOHEXOL 350 MG/ML SOLN COMPARISON:  No prior CTA, correlation is made with CT head 06/24/2021 FINDINGS: CT HEAD FINDINGS For noncontrast findings, please see same day CT head. CTA NECK FINDINGS Aortic arch: Standard branching. Imaged portion shows no evidence of aneurysm or dissection. No significant stenosis of the major arch vessel origins. Aortic atherosclerosis. Right carotid system: No evidence of dissection, occlusion, or hemodynamically significant stenosis (greater than 50%). Calcifications at the bifurcation, in the proximal right ICA, and in the distal right ICA are not hemodynamically significant. Left carotid system: No evidence of dissection, occlusion, or hemodynamically significant stenosis (greater than 50%). Calcifications at the bifurcation and in the left ICA are not hemodynamically significant. Vertebral arteries: No evidence of dissection, occlusion, or hemodynamically significant stenosis (greater than 50%). Skeleton: Diffusely sclerosis. Status post median sternotomy. Degenerative changes in the cervical spine. Other neck: No acute finding. Upper chest: No focal pulmonary opacity or pleural effusion. Review of the MIP images confirms the above findings CTA HEAD FINDINGS Anterior circulation: Both internal carotid arteries are patent to the termini, with moderate to severe narrowing in the right cavernous carotid, moderate narrowing in the right supraclinoid carotid, and moderate narrowing in the left cavernous carotid. Left A1 segment is patent. 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 Normal anterior communicating artery. Anterior cerebral arteries are patent to their distal aspects. No M1 stenosis or occlusion, although the right M1 is somewhat irregular. Normal MCA bifurcations. Distal MCA branches perfused and symmetric. Posterior circulation: Vertebral arteries patent to the vertebrobasilar junction, with mild narrowing in the proximal left V4 and moderate stenosis in the  distal left V4. Posterior inferior cerebellar arteries patent proximally. Basilar patent to its distal aspect but diminutive. Superior cerebellar arteries patent proximally. Possible hypoplastic P1 segments, with fetal or near fetal origin of the bilateral PCAs. PCAs perfused to their Venous sinuses: As permitted by contrast timing, patent. Anatomic variants: Fetal or near fetal origin of the bilateral PCAs. Review of the MIP images confirms the above findings IMPRESSION: 1. No intracranial large vessel occlusion. Moderate to severe narrowing of the right cavernous carotid and moderate narrowing in the right supraclinoid carotid, left cavernous carotid, and distal left V4. 2.  No hemodynamically significant stenosis in the neck. 3. Diffuse osseous sclerosis, which is nonspecific but can be seen in the setting of renal osteodystrophy, Paget's disease, and diffuse metastatic disease, among other conditions. Electronically Signed   By: Merilyn Baba M.D.   On: 06/24/2021 20:34   CT Angio Neck W and/or Wo Contrast  Result Date: 06/24/2021 CLINICAL DATA:  Aphasia, facial droop, stroke suspected EXAM: CT ANGIOGRAPHY HEAD AND NECK TECHNIQUE: Multidetector CT imaging of the head and neck was performed using the standard protocol during bolus administration of intravenous contrast. Multiplanar CT image reconstructions and MIPs were obtained to evaluate the vascular anatomy. Carotid stenosis measurements (when applicable) are obtained utilizing NASCET criteria, using the distal internal carotid diameter as the denominator. RADIATION DOSE REDUCTION: This exam was performed according to the departmental dose-optimization program which includes automated exposure control, adjustment of the mA and/or kV according to patient size and/or use of iterative reconstruction technique. CONTRAST:  132m OMNIPAQUE IOHEXOL 350 MG/ML SOLN COMPARISON:  No prior CTA, correlation is made with CT head 06/24/2021 FINDINGS: CT HEAD FINDINGS  For noncontrast findings, please see same day CT head. CTA NECK FINDINGS Aortic arch: Standard branching. Imaged portion shows no evidence of aneurysm or dissection. No significant stenosis of the major arch vessel origins. Aortic atherosclerosis. Right carotid system: No evidence of dissection, occlusion, or hemodynamically significant stenosis (greater than 50%). Calcifications at the bifurcation, in the proximal right ICA, and in the distal right ICA are not hemodynamically significant. Left carotid system: No evidence of dissection, occlusion, or hemodynamically significant stenosis (greater than 50%). Calcifications at the bifurcation and in the left ICA are not hemodynamically significant. Vertebral arteries: No evidence of dissection, occlusion, or hemodynamically significant stenosis (greater than 50%). Skeleton: Diffusely sclerosis. Status post median sternotomy. Degenerative changes in the cervical spine. Other neck: No acute finding. Upper chest: No focal pulmonary opacity or pleural effusion. Review of the MIP images confirms the above findings CTA HEAD FINDINGS Anterior circulation: Both internal carotid arteries are patent to the termini, with moderate to severe narrowing in the right cavernous carotid, moderate narrowing in the right supraclinoid carotid, and moderate narrowing in the left cavernous carotid. Left A1 segment is patent. 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 Normal anterior communicating artery. Anterior cerebral arteries are patent to their distal aspects. No M1 stenosis or occlusion, although the right M1 is somewhat irregular. Normal MCA bifurcations. Distal MCA branches perfused and symmetric. Posterior circulation: Vertebral arteries patent to the vertebrobasilar junction, with mild narrowing in the proximal left V4 and moderate stenosis in the distal left V4. Posterior inferior cerebellar arteries patent proximally. Basilar patent to its distal aspect but diminutive. Superior cerebellar  arteries patent proximally. Possible hypoplastic P1 segments, with fetal or near fetal origin of the bilateral PCAs. PCAs perfused to their Venous sinuses: As permitted by contrast timing, patent. Anatomic variants: Fetal or near fetal origin of the bilateral PCAs. Review of the MIP images confirms the above findings IMPRESSION: 1. No intracranial large vessel occlusion. Moderate to severe narrowing of the right cavernous carotid and moderate narrowing in the right supraclinoid carotid, left cavernous carotid, and distal left V4. 2.  No hemodynamically significant stenosis in the neck. 3. Diffuse osseous sclerosis, which is nonspecific but can be seen in the setting of renal osteodystrophy, Paget's disease, and diffuse metastatic disease, among other conditions. Electronically Signed   By: AMerilyn BabaM.D.   On: 06/24/2021 20:34   CT HEAD CODE STROKE WO CONTRAST  Result Date: 06/24/2021  CLINICAL DATA:  Code stroke.  Aphasia.  Facial droop EXAM: CT HEAD WITHOUT CONTRAST TECHNIQUE: Contiguous axial images were obtained from the base of the skull through the vertex without intravenous contrast. RADIATION DOSE REDUCTION: This exam was performed according to the departmental dose-optimization program which includes automated exposure control, adjustment of the mA and/or kV according to patient size and/or use of iterative reconstruction technique. COMPARISON:  12/27/2020 CT head FINDINGS: Brain: No evidence of acute infarction, hemorrhage, cerebral edema, mass, mass effect, or midline shift. Ventricles and sulci are normal for age. No extra-axial fluid collection. Remote infarct in the left cerebellum. Vascular: No hyperdense vessel. Skull: Negative for fracture or focal lesion. Sinuses/Orbits: Mucosal thickening in the left-greater-than-right maxillary sinus and anterior ethmoid air cells. Other: The mastoid air cells are well aerated. ASPECTS [facility] Stroke Program Early CT Score) - Ganglionic level infarction  (caudate, lentiform nuclei, internal capsule, insula, M1-M3 cortex): 7 - Supraganglionic infarction (M4-M6 cortex): 3 Total score (0-10 with 10 being normal): 10 IMPRESSION: 1. No acute intracranial process. 2. ASPECTS is 10 Code stroke imaging results were communicated on 06/24/2021 at 7:51 pm to provider Yetta Marceaux via telephone, who verbally acknowledged these results. Electronically Signed   By: Merilyn Baba M.D.   On: 06/24/2021 19:51    Procedures .Critical Care Performed by: Ezequiel Essex, MD Authorized by: Ezequiel Essex, MD   Critical care provider statement:    Critical care time (minutes):  45   Critical care time was exclusive of:  Separately billable procedures and treating other patients   Critical care was necessary to treat or prevent imminent or life-threatening deterioration of the following conditions:  CNS failure or compromise   Critical care was time spent personally by me on the following activities:  Development of treatment plan with patient or surrogate, discussions with consultants, evaluation of patient's response to treatment, examination of patient, ordering and review of laboratory studies, ordering and review of radiographic studies, ordering and performing treatments and interventions, pulse oximetry, re-evaluation of patient's condition and review of old charts   I assumed direction of critical care for this patient from another provider in my specialty: no     Care discussed with: admitting provider      Medications Ordered in ED Medications  tenecteplase (TNKASE) 50 MG injection for Stroke (has no administration in time range)    ED Course/ Medical Decision Making/ A&P                           Medical Decision Making Amount and/or Complexity of Data Reviewed Labs: ordered. Decision-making details documented in ED Course. Radiology: ordered and independent interpretation performed. Decision-making details documented in ED Course. ECG/medicine tests:  ordered and independent interpretation performed. Decision-making details documented in ED Course.  Risk Prescription drug management. Decision regarding hospitalization.   Difficulty speaking with slurred speech for the past 2.5 hours.  Code stroke activated.  On initial assessment  CT head is negative for hemorrhage.  Results reviewed and interpreted by me. Patient not tPA candidate due to Coumadin use. INR 2.7  Patient still with aphasia as well as dysarthria.  Seen by teleneurology Dr. Adrian Blackwater who agrees concerning for stroke. CTA shows no large vessel occlusion but does show some carotid stenosis.  Patient is not interventional radiology candidate.  Discussion with neurology, will admit for stroke work-up including MRI and echocardiogram  "Recommendations:         Stroke/Telemetry Floor  Neuro Checks       Bedside Swallow Eval       DVT Prophylaxis       IV Fluids, Normal Saline       Head of Bed 30 Degrees       Euglycemia and Avoid Hyperthermia (PRN Acetaminophen)       Hold Anticoagulation for Now       Antihypertensives PRN if Blood pressure is greater than 220/120 or there is a concern for End organ damage/contraindications for permissive HTN. If blood pressure is greater than 220/120 give labetalol PO or IV or Vasotec IV with a goal of 15% reduction in BP during the first 24 hours."  Blood pressure remains elevated but below threshold for treatment.  Patient discussed with Dr. Hal Hope for admission         Final Clinical Impression(s) / ED Diagnoses Final diagnoses:  Cerebrovascular accident (CVA), unspecified mechanism [facility])    Rx / [facility] Orders ED Discharge Orders     None         Ezequiel Essex, MD 06/24/21 2312

## 2021-06-25 ENCOUNTER — Inpatient Hospital Stay (HOSPITAL_COMMUNITY): Payer: Medicare HMO

## 2021-06-25 DIAGNOSIS — F17290 Nicotine dependence, other tobacco product, uncomplicated: Secondary | ICD-10-CM | POA: Diagnosis not present

## 2021-06-25 DIAGNOSIS — I10 Essential (primary) hypertension: Secondary | ICD-10-CM

## 2021-06-25 DIAGNOSIS — I129 Hypertensive chronic kidney disease with stage 1 through stage 4 chronic kidney disease, or unspecified chronic kidney disease: Secondary | ICD-10-CM | POA: Diagnosis not present

## 2021-06-25 DIAGNOSIS — E1142 Type 2 diabetes mellitus with diabetic polyneuropathy: Secondary | ICD-10-CM | POA: Diagnosis not present

## 2021-06-25 DIAGNOSIS — I63412 Cerebral infarction due to embolism of left middle cerebral artery: Secondary | ICD-10-CM | POA: Diagnosis not present

## 2021-06-25 DIAGNOSIS — G4733 Obstructive sleep apnea (adult) (pediatric): Secondary | ICD-10-CM

## 2021-06-25 DIAGNOSIS — Z794 Long term (current) use of insulin: Secondary | ICD-10-CM | POA: Diagnosis not present

## 2021-06-25 DIAGNOSIS — I639 Cerebral infarction, unspecified: Secondary | ICD-10-CM | POA: Diagnosis not present

## 2021-06-25 DIAGNOSIS — E876 Hypokalemia: Secondary | ICD-10-CM | POA: Diagnosis not present

## 2021-06-25 DIAGNOSIS — I6389 Other cerebral infarction: Secondary | ICD-10-CM | POA: Diagnosis not present

## 2021-06-25 DIAGNOSIS — E669 Obesity, unspecified: Secondary | ICD-10-CM | POA: Diagnosis not present

## 2021-06-25 DIAGNOSIS — G370 Diffuse sclerosis of central nervous system: Secondary | ICD-10-CM | POA: Diagnosis not present

## 2021-06-25 DIAGNOSIS — M1990 Unspecified osteoarthritis, unspecified site: Secondary | ICD-10-CM | POA: Diagnosis not present

## 2021-06-25 DIAGNOSIS — E1122 Type 2 diabetes mellitus with diabetic chronic kidney disease: Secondary | ICD-10-CM | POA: Diagnosis not present

## 2021-06-25 DIAGNOSIS — E785 Hyperlipidemia, unspecified: Secondary | ICD-10-CM | POA: Diagnosis not present

## 2021-06-25 DIAGNOSIS — N179 Acute kidney failure, unspecified: Secondary | ICD-10-CM | POA: Diagnosis not present

## 2021-06-25 DIAGNOSIS — R4701 Aphasia: Secondary | ICD-10-CM | POA: Diagnosis present

## 2021-06-25 DIAGNOSIS — R29704 NIHSS score 4: Secondary | ICD-10-CM | POA: Diagnosis not present

## 2021-06-25 DIAGNOSIS — Z20822 Contact with and (suspected) exposure to covid-19: Secondary | ICD-10-CM | POA: Diagnosis not present

## 2021-06-25 DIAGNOSIS — I2510 Atherosclerotic heart disease of native coronary artery without angina pectoris: Secondary | ICD-10-CM | POA: Diagnosis not present

## 2021-06-25 DIAGNOSIS — E7800 Pure hypercholesterolemia, unspecified: Secondary | ICD-10-CM

## 2021-06-25 DIAGNOSIS — Z951 Presence of aortocoronary bypass graft: Secondary | ICD-10-CM | POA: Diagnosis not present

## 2021-06-25 DIAGNOSIS — R29703 NIHSS score 3: Secondary | ICD-10-CM | POA: Diagnosis not present

## 2021-06-25 DIAGNOSIS — N183 Chronic kidney disease, stage 3 unspecified: Secondary | ICD-10-CM | POA: Diagnosis not present

## 2021-06-25 DIAGNOSIS — Z952 Presence of prosthetic heart valve: Secondary | ICD-10-CM | POA: Diagnosis not present

## 2021-06-25 DIAGNOSIS — Z7984 Long term (current) use of oral hypoglycemic drugs: Secondary | ICD-10-CM | POA: Diagnosis not present

## 2021-06-25 DIAGNOSIS — Z8673 Personal history of transient ischemic attack (TIA), and cerebral infarction without residual deficits: Secondary | ICD-10-CM | POA: Diagnosis not present

## 2021-06-25 DIAGNOSIS — Z7901 Long term (current) use of anticoagulants: Secondary | ICD-10-CM | POA: Diagnosis not present

## 2021-06-25 DIAGNOSIS — Z6832 Body mass index (BMI) 32.0-32.9, adult: Secondary | ICD-10-CM | POA: Diagnosis not present

## 2021-06-25 HISTORY — DX: Aphasia: R47.01

## 2021-06-25 LAB — LIPID PANEL
Cholesterol: 143 mg/dL (ref 0–200)
HDL: 30 mg/dL — ABNORMAL LOW (ref >40)
LDL Cholesterol: 65 mg/dL (ref 0–99)
Total CHOL/HDL Ratio: 4.8 RATIO
Triglycerides: 240 mg/dL — ABNORMAL HIGH (ref <150)
VLDL: 48 mg/dL — ABNORMAL HIGH (ref 0–40)

## 2021-06-25 LAB — GLUCOSE, CAPILLARY
Glucose-Capillary: 146 mg/dL — ABNORMAL HIGH (ref 70–99)
Glucose-Capillary: 156 mg/dL — ABNORMAL HIGH (ref 70–99)
Glucose-Capillary: 160 mg/dL — ABNORMAL HIGH (ref 70–99)
Glucose-Capillary: 165 mg/dL — ABNORMAL HIGH (ref 70–99)
Glucose-Capillary: 179 mg/dL — ABNORMAL HIGH (ref 70–99)
Glucose-Capillary: 198 mg/dL — ABNORMAL HIGH (ref 70–99)

## 2021-06-25 LAB — BASIC METABOLIC PANEL
Anion gap: 10 (ref 5–15)
BUN: 26 mg/dL — ABNORMAL HIGH (ref 8–23)
CO2: 24 mmol/L (ref 22–32)
Calcium: 9 mg/dL (ref 8.9–10.3)
Chloride: 103 mmol/L (ref 98–111)
Creatinine, Ser: 1.35 mg/dL — ABNORMAL HIGH (ref 0.61–1.24)
GFR, Estimated: 54 mL/min — ABNORMAL LOW (ref >60)
Glucose, Bld: 151 mg/dL — ABNORMAL HIGH (ref 70–99)
Potassium: 3.4 mmol/L — ABNORMAL LOW (ref 3.5–5.1)
Sodium: 137 mmol/L (ref 135–145)

## 2021-06-25 LAB — ECHOCARDIOGRAM COMPLETE
AV Mean grad: 11 mmHg
AV Peak grad: 19.5 mmHg
Ao pk vel: 2.21 m/s
Area-P 1/2: 2.48 cm2
Height: 70 in
S' Lateral: 2.6 cm
Weight: 3604.96 oz

## 2021-06-25 LAB — TROPONIN I (HIGH SENSITIVITY): Troponin I (High Sensitivity): 25 ng/L — ABNORMAL HIGH (ref <18)

## 2021-06-25 LAB — HEMOGLOBIN A1C
Hgb A1c MFr Bld: 8.3 % — ABNORMAL HIGH (ref 4.8–5.6)
Mean Plasma Glucose: 191.51 mg/dL

## 2021-06-25 MED ORDER — FENOFIBRATE 160 MG PO TABS
160.0000 mg | ORAL_TABLET | Freq: Every day | ORAL | Status: DC
  Administered 2021-06-25 – 2021-06-26 (×2): 160 mg via ORAL
  Filled 2021-06-25 (×2): qty 1

## 2021-06-25 MED ORDER — ROSUVASTATIN CALCIUM 20 MG PO TABS
40.0000 mg | ORAL_TABLET | Freq: Every evening | ORAL | Status: DC
Start: 2021-06-25 — End: 2021-06-26
  Administered 2021-06-25: 40 mg via ORAL
  Filled 2021-06-25: qty 2

## 2021-06-25 MED ORDER — STROKE: EARLY STAGES OF RECOVERY BOOK
Freq: Once | Status: AC
  Filled 2021-06-25: qty 1

## 2021-06-25 MED ORDER — ACETAMINOPHEN 160 MG/5ML PO SOLN: 650.0000 mg | ORAL | Status: DC | PRN

## 2021-06-25 MED ORDER — ACETAMINOPHEN 650 MG RE SUPP: 650.0000 mg | RECTAL | Status: DC | PRN

## 2021-06-25 MED ORDER — POTASSIUM CHLORIDE CRYS ER 20 MEQ PO TBCR
40.0000 meq | EXTENDED_RELEASE_TABLET | Freq: Once | ORAL | Status: AC
Start: 2021-06-25 — End: 2021-06-25
  Administered 2021-06-25: 40 meq via ORAL
  Filled 2021-06-25: qty 2

## 2021-06-25 MED ORDER — ACETAMINOPHEN 325 MG PO TABS: 650.0000 mg | ORAL_TABLET | ORAL | Status: DC | PRN

## 2021-06-25 MED ORDER — INSULIN ASPART 100 UNIT/ML IJ SOLN
0.0000 [IU] | INTRAMUSCULAR | Status: DC
  Administered 2021-06-25: 1 [IU] via SUBCUTANEOUS
  Administered 2021-06-25 – 2021-06-26 (×7): 2 [IU] via SUBCUTANEOUS

## 2021-06-25 MED ORDER — WARFARIN - PHARMACIST DOSING INPATIENT: Freq: Every day | Status: DC

## 2021-06-25 MED ORDER — PERFLUTREN LIPID MICROSPHERE
1.0000 mL | INTRAVENOUS | Status: AC | PRN
  Administered 2021-06-25: 2 mL via INTRAVENOUS

## 2021-06-25 MED ORDER — WARFARIN SODIUM 7.5 MG PO TABS
7.5000 mg | ORAL_TABLET | Freq: Once | ORAL | Status: AC
  Administered 2021-06-25: 7.5 mg via ORAL
  Filled 2021-06-25: qty 1

## 2021-06-25 NOTE — Evaluation (Signed)
Speech Language Pathology Evaluation Patient Details Name: [name] MRN: [id number] DOB: 07-27-1945 Today's Date: 06/25/2021 Time: 1030-1130 SLP Time Calculation (min) (ACUTE ONLY): 60 min  Problem List:  Patient Active Problem List   Diagnosis Date Noted   Expressive aphasia 06/25/2021   Acute CVA (cerebrovascular accident) ([place]) 06/24/2021   Spinal stenosis 02/05/2021   S/P lumbar laminectomy 02/04/2021   Lumbar stenosis with neurogenic claudication 02/04/2021   Leg weakness, bilateral 01/18/2021   Acute low back pain 01/18/2021   Cerebrovascular accident (CVA) ([place]) 12/27/2020   Memory loss 12/07/2020   Type 2 diabetes mellitus with diabetic polyneuropathy, with long-term current use of insulin ([place]) 11/11/2018   Type 2 diabetes mellitus with stage 3 chronic kidney disease, with long-term current use of insulin ([place]) 11/11/2018   Hx of CABG 11/05/2018   Dyslipidemia 11/05/2018   Primary osteoarthritis of left shoulder 10/31/2018   Long term (current) use of anticoagulants 05/22/2018   Hereditary hemochromatosis ([place]) 03/30/2016   Gout 04/09/2015   Ventral hernia 03/03/2014   History of prostate cancer 11/28/2013   OSA (obstructive sleep apnea) 07/12/2012   Uncontrolled type 2 diabetes mellitus with hyperglycemia ([place]) 05/26/2012   Hemochromatosis 02/14/2011   Actinic keratosis 02/14/2011   Hypertriglyceridemia 06/13/2010   HEMORRHOIDS, EXTERNAL 03/21/2010   History of aortic valve replacement 10/13/2009   ADENOCARCINOMA, PROSTATE 06/17/2009   TOBACCO USER 06/16/2009   Sensorineural hearing loss (SNHL) 06/16/2009   Essential hypertension 06/16/2009   CORONARY ATHEROSCLEROSIS NATIVE CORONARY ARTERY 06/15/2009   ERECTILE DYSFUNCTION, ORGANIC 05/18/2009   Hypertrophic obstructive cardiomyopathy ([place]) 05/06/2008   OBESITY, MODERATE 04/29/2008   Past Medical History:  Past Medical History:  Diagnosis Date   Allergy    Aortic stenosis    Arthritis    CAD  (coronary artery disease)    cabg   Cancer ([place]) 01/30/2009   prostate, skin   Chronic kidney disease    Complication of anesthesia    Erectile dysfunction    Heart murmur    Hemochromatosis 02/14/2011   Hemorrhoids    History of gout    History of hepatitis 01/31/1972   Hyperlipidemia    Hypertension    Hypertr obst cardiomyop    Mechanical heart valve present    Manufacturer: [employer] #: [id number]  Model #: [id number]. Card states MRI compatible with 3 teslas or less.   Obesity    moderate   PONV (postoperative nausea and vomiting)    after CABG- slow to wake up   Sensorineural hearing loss    Sleep apnea    Stroke [facility])    Old stroke - patient was not aware.   Type II or unspecified type diabetes mellitus without mention of complication, not stated as uncontrolled    Past Surgical History:  Past Surgical History:  Procedure Laterality Date   AORTIC VALVE REPLACEMENT  11/14/2003   [facility]   APPENDECTOMY  1990   CHOLECYSTECTOMY  1990   CORONARY ARTERY BYPASS GRAFT  10/2003   HERNIA REPAIR  1999   right, inguinal   HERNIA REPAIR  2002   left, inguinal   HIP SURGERY  2006   right hip   LUMBAR LAMINECTOMY/DECOMPRESSION MICRODISCECTOMY Bilateral 02/04/2021   Procedure: Bilateral Lumbar Two-Three Laminectomy;  Surgeon: Kristeen Miss, MD;  Location: [facility];  Service: Neurosurgery;  Laterality: Bilateral;  3C/RM 20   PILONIDAL CYST EXCISION  1964   prostate seed implant  3/12   TONSILLECTOMY  childhood   HPI:  76 yo male adm to [facility] with speech deficits starting at 1700 on 06/24/2021.  Pt has h/o mechanical aortic valve replacement, back surgery in January 2023, HTN, gout, DM, obesity, old CVA.  Pt for swallow/speech evaluation.  Per chart, pt and wife have a 47 yo son who has autism, for whom they provide care.  Pt and wife are retired.  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   MRI  showed: Small to moderate left MCA branch infarct affecting left insular and frontal cortex.  2. Small chronic cerebellar infarcts. Mild chronic microvascular  ischemia and cerebral volume loss for age.   Assessment / Plan / Recommendation Clinical Impression  Patient presents with expressive language deficits c/b dysfluent verbal output, causing frequent pausing, false starts and occasional semantic paraphasias and significant word finding deficits.  He is aware of his deficits and use of phonemic, written first few letters, cues help him to access language/words he was attempting to articulate.  Automatic speech tasks fluent.   Pt's repetition and comprehension of language is excellent.  Written language c/b letter substitutions as well as semantic paraphasias.  [facility] Aphasia [facility]atter initiated *all except writing* with him scoring 76 for Bedside Aphasia Score.  Scored 100% on receptive language tasks.  His scoring is more consistent with an Anomic aphasia given he is dysfluent, has good repetition abiities and excellent verbal comprehension.  Recommend aggressive SLP for expressive language deficits (verbal and written).   Educated wife and pt to findings/recommendations and reviewed effective compensation strategies using teach back.  Wife prefers [facility] SLP.    SLP Assessment  SLP Recommendation/Assessment: Patient needs continued Speech [facility] SLP Visit Diagnosis: Aphasia (R47.01)    Recommendations for follow up therapy are one component of a multi-disciplinary discharge planning process, led by the attending physician.  Recommendations may be updated based on patient status, additional functional criteria and insurance authorization.    Follow Up Recommendations  Outpatient SLP    Assistance Recommended at Discharge  Frequent or constant Supervision/Assistance  Functional Status Assessment Patient has had a recent decline in their functional status and demonstrates the  ability to make significant improvements in function in a reasonable and predictable amount of time.  Frequency and Duration min 1 x/week         SLP Evaluation Cognition  Overall Cognitive Status: Difficult to assess (expressive aphasia impacts cognitive questions) Orientation Level: Oriented to person;Oriented to place;Oriented to situation Year:  (uncertain, DNT) Attention: Sustained;Selective Sustained Attention: Appears intact Selective Attention: Impaired (pt is distracted easily , uncertain if baseline) Awareness: Appears intact (pt showing awareness of expressive language deficits) Behaviors: Impulsive       Comprehension  Auditory Comprehension Overall Auditory Comprehension: Appears within functional limits for tasks assessed Yes/No Questions: Within Functional Limits Commands: Within Functional Limits Conversation: Complex Visual Recognition/Discrimination Discrimination: Within Function Limits Reading Comprehension Reading Status: Not tested (able to read single words, did not test beyond this)    Expression Expression Primary Mode of Expression: Verbal Verbal Expression Overall Verbal Expression: Impaired Initiation: Impaired Automatic Speech: Singing;Social Response Level of Generative/Spontaneous Verbalization: Word;Phrase;Sentence Repetition: No impairment Naming: Impairment Other Naming Comments: pt named 13/20 items correctly, 5/20 with cue - phonemic, first few letters Verbal Errors: Semantic paraphasias;Aware of errors Pragmatics: No impairment Effective Techniques: Phonemic cues;Written cues;Articulatory cues Non-Verbal Means of Communication: Gestures Written Expression Dominant Hand: Right Written Expression: Exceptions to [facility] (written language  emulated verbal, paraphasias with self directed, able to write simple sentence verbally provided)   Oral / Motor  Oral Motor/Sensory Function Overall Oral Motor/Sensory Function: Within functional  limits Motor Speech Overall Motor Speech: Appears within functional limits for tasks assessed Respiration: Within functional limits Phonation: Normal Resonance: Within functional limits Articulation: Within functional limitis Intelligibility: Intelligible Motor Planning: Impaired Level of Impairment: Word Motor Speech Errors: Groping for words;Aware;Inconsistent           Kathleen Lime, MS [facility] SLP Acute Rehab Services Office [phone] Pager [phone]  Macario Golds 06/25/2021, 1:35 PM

## 2021-06-25 NOTE — Plan of Care (Signed)
Progressing towards goals

## 2021-06-25 NOTE — Plan of Care (Signed)
Stroke careplan initiated.

## 2021-06-25 NOTE — Progress Notes (Addendum)
  Echocardiogram 2D Echocardiogram with contrast has been performed.  [name] F 06/25/2021, 3:50 PM

## 2021-06-25 NOTE — H&P (Signed)
History and Physical    [name] VEH:[id number] DOB: May 17, 1945 DOA: 06/24/2021  PCP: [name], NP  Patient coming from: [facility] ED  Chief Complaint: Aphasia  HPI: [name] is a 76 y.o. male with medical history significant of hypertension, obesity (BMI 32.33), OSA on CPAP, insulin-dependent type 2 diabetes, aortic stenosis status post mechanical aortic valve replacement on Coumadin, CAD, hypertension, hyperlipidemia, hypertrophic cardiomyopathy status post myectomy, history of skin and prostate cancer, CVA presented to ED with acute onset expressive aphasia, LKW at 5 PM on 06/24/2021.  Not a tPA candidate due to Coumadin use and INR 2.7.  CT head without evidence of hemorrhage.  CTA head and neck without evidence of LVO.  Patient was seen by teleneurology, recommended hospital admission for stroke work-up.  Labs significant for glucose 182, BUN 30, creatinine 1.5 (previously ranging between 1.1-1.4), blood ethanol level undetectable, UDS negative, UA not suggestive of infection.  Patient unable to give any history due to expressive aphasia. No family available at this time.   Review of Systems:  Review of Systems  All other systems reviewed and are negative.  Past Medical History:  Diagnosis Date   Allergy    Aortic stenosis    Arthritis    CAD (coronary artery disease)    cabg   Cancer ([place]) 01/30/2009   prostate, skin   Chronic kidney disease    Complication of anesthesia    Erectile dysfunction    Heart murmur    Hemochromatosis 02/14/2011   Hemorrhoids    History of gout    History of hepatitis 01/31/1972   Hyperlipidemia    Hypertension    Hypertr obst cardiomyop    Mechanical heart valve present    Manufacturer: Sherlean Foot #: [id number]  Model #: [id number]. Card states MRI compatible with 3 teslas or less.   Obesity    moderate   PONV (postoperative nausea and vomiting)    after CABG- slow to wake up   Sensorineural hearing loss     Sleep apnea    Stroke [facility])    Old stroke - patient was not aware.   Type II or unspecified type diabetes mellitus without mention of complication, not stated as uncontrolled     Past Surgical History:  Procedure Laterality Date   AORTIC VALVE REPLACEMENT  11/14/2003   [facility]   APPENDECTOMY  1990   CHOLECYSTECTOMY  1990   CORONARY ARTERY BYPASS GRAFT  10/2003   HERNIA REPAIR  1999   right, inguinal   HERNIA REPAIR  2002   left, inguinal   HIP SURGERY  2006   right hip   LUMBAR LAMINECTOMY/DECOMPRESSION MICRODISCECTOMY Bilateral 02/04/2021   Procedure: Bilateral Lumbar Two-Three Laminectomy;  Surgeon: Kristeen Miss, MD;  Location: [facility];  Service: Neurosurgery;  Laterality: Bilateral;  3C/RM 20   PILONIDAL CYST EXCISION  1964   prostate seed implant  3/12   TONSILLECTOMY  childhood     reports that he has been smoking cigars. He has never used smokeless tobacco. He reports that he does not currently use alcohol. He reports that he does not use drugs.  No Known Allergies  Family History  Problem Relation Age of Onset   Heart disease Mother    Stroke Mother    Heart disease Father    Asperger's syndrome Son    Hyperlipidemia Son    Coronary artery disease Brother    Cancer Neg Hx  negative for colon cancer    Prior to Admission medications   Medication Sig Start Date End Date Taking? Authorizing Provider  AMBULATORY NON FORMULARY MEDICATION cpap cushions            AirFit F20 (Size: Large)           AirFit F30 (Size: Med)   Please FAX the prescription to:  [phone] 02/10/20   [name], NP  betamethasone dipropionate (DIPROLENE) 0.05 % cream Apply topically 2 (two) times daily as needed. To eczema rash 09/06/18   [name], NP  Blood Glucose Monitoring Suppl Supplies MISC Use for monitoring glucose level 08/05/18   [name], NP  colchicine 0.6 MG tablet TAKE 2 TABS BY MOUTH NOW AND THEN 1 TAB IN 1 HOUR FOR GOUT. (MAX 3  TABS/24 HRS) Patient taking differently: Take 0.6 mg by mouth daily as needed (gout). 12/07/20   [name], NP  Continuous Blood Gluc Sensor (DEXCOM G6 SENSOR) MISC 1 Device by Does not apply route as directed. 05/18/20   Shamleffer, Melanie Crazier, MD  Continuous Blood Gluc Transmit (DEXCOM G6 TRANSMITTER) MISC 1 Device by Does not apply route as directed. 05/18/20   Shamleffer, Melanie Crazier, MD  dapagliflozin propanediol (FARXIGA) 10 MG TABS tablet Take 1 tablet (10 mg total) by mouth daily before breakfast. 04/26/21   Shamleffer, Melanie Crazier, MD  fenofibrate (TRICOR) 145 MG tablet TAKE 1 TABLET BY MOUTH EVERY DAY 04/17/21   [name], NP  furosemide (LASIX) 20 MG tablet TAKE 1 TABLET BY MOUTH EVERY DAY AS NEEDED FOR SWELLING 04/17/21   [name], NP  glucose blood (ONETOUCH VERIO) test strip Use as instructed 09/15/20   [name], NP  icosapent Ethyl (VASCEPA) 1 g capsule Take 2 capsules (2 g total) by mouth 2 (two) times daily. 04/20/21   Lelon Perla, MD  insulin aspart (NOVOLOG FLEXPEN) 100 UNIT/ML FlexPen Max daily 70 units 04/26/21   Shamleffer, Melanie Crazier, MD  Insulin Glargine (BASAGLAR KWIKPEN) 100 UNIT/ML Inject 24 Units into the skin daily. 04/26/21   Shamleffer, Melanie Crazier, MD  Insulin Pen Needle 32G X 4 MM MISC 1 Device by Does not apply route in the morning, at noon, in the evening, and at bedtime. 04/26/21   Shamleffer, Melanie Crazier, MD  metoprolol succinate (TOPROL-XL) 25 MG 24 hr tablet TAKE 1 TABLET BY MOUTH EVERY DAY 01/07/21   [name], NP  OneTouch Delica Lancets 99M MISC USE AS DIRECTED 09/15/20   [name], NP  rosuvastatin (CRESTOR) 40 MG tablet Take 1 tablet (40 mg total) by mouth daily. Patient taking differently: Take 40 mg by mouth every evening. 12/06/20 03/06/21  Lelon Perla, MD  tacrolimus (PROTOPIC) 0.1 % ointment Apply 1 application topically daily as needed (irritation). 11/29/20    [provider]  triamcinolone ointment (KENALOG) 0.1 % Apply 1 application. topically 2 (two) times daily. 12/06/20   [provider]  warfarin (COUMADIN) 7.5 MG tablet Take up to 1 tablet by mouth daily as directed by coumadin clinic 04/27/21   Lelon Perla, MD    Physical Exam: Vitals:   06/24/21 2230 06/24/21 2300 06/24/21 2330 06/25/21 0113  BP: (!) 175/75 (!) 171/76 (!) 171/73 (!) 187/75  Pulse: (!) 56 (!) 57 (!) 56 66  Resp: '18 19 12 20  '$ Temp:   98.3 F (36.8 C) 98.4 F (36.9 C)  TempSrc:   Oral Oral  SpO2: 97% 96% 98% 98%  Weight:  Height:        Physical Exam Vitals reviewed.  Constitutional:      General: He is not in acute distress. HENT:     Head: Normocephalic and atraumatic.  Eyes:     Extraocular Movements: Extraocular movements intact.  Cardiovascular:     Rate and Rhythm: Normal rate and regular rhythm.     Pulses: Normal pulses.  Pulmonary:     Effort: Pulmonary effort is normal. No respiratory distress.     Breath sounds: Normal breath sounds. No wheezing or rales.  Abdominal:     General: Bowel sounds are normal.     Palpations: Abdomen is soft.     Tenderness: There is no abdominal tenderness. There is no guarding.  Musculoskeletal:     Cervical back: Normal range of motion.     Right lower leg: Edema present.     Left lower leg: Edema present.     Comments: 3+ pedal edema  Skin:    General: Skin is warm and dry.  Neurological:     Mental Status: He is alert.     Cranial Nerves: No cranial nerve deficit.     Comments: Expressive aphasia Moving all extremities on command, no focal weakness     Labs on Admission: I have personally reviewed following labs and imaging studies  CBC: Recent Labs  Lab 06/24/21 1940  WBC 8.4  NEUTROABS 5.8  HGB 15.2  HCT 44.3  MCV 87.7  PLT 076   Basic Metabolic Panel: Recent Labs  Lab 06/24/21 1940  NA 137  K 3.5  CL 103  CO2 25  GLUCOSE 182*  BUN 30*  CREATININE 1.50*   CALCIUM 9.0   GFR: Estimated Creatinine Clearance: 50.2 mL/min (A) (by C-G formula based on SCr of 1.5 mg/dL (H)). Liver Function Tests: Recent Labs  Lab 06/24/21 1940  AST 32  ALT 24  ALKPHOS 56  BILITOT 0.5  PROT 7.9  ALBUMIN 4.1   No results for input(s): LIPASE, AMYLASE in the last 168 hours. No results for input(s): AMMONIA in the last 168 hours. Coagulation Profile: Recent Labs  Lab 06/24/21 1940  INR 2.7*   Cardiac Enzymes: No results for input(s): CKTOTAL, CKMB, CKMBINDEX, TROPONINI in the last 168 hours. BNP (last 3 results) No results for input(s): PROBNP in the last 8760 hours. HbA1C: No results for input(s): HGBA1C in the last 72 hours. CBG: Recent Labs  Lab 06/24/21 1930  GLUCAP 172*   Lipid Profile: No results for input(s): CHOL, HDL, LDLCALC, TRIG, CHOLHDL, LDLDIRECT in the last 72 hours. Thyroid Function Tests: No results for input(s): TSH, T4TOTAL, FREET4, T3FREE, THYROIDAB in the last 72 hours. Anemia Panel: No results for input(s): VITAMINB12, FOLATE, FERRITIN, TIBC, IRON, RETICCTPCT in the last 72 hours. Urine analysis:    Component Value Date/Time   COLORURINE YELLOW 06/24/2021 2105   APPEARANCEUR CLEAR 06/24/2021 2105   LABSPEC 1.015 06/24/2021 2105   PHURINE 7.0 06/24/2021 2105   GLUCOSEU >=500 (A) 06/24/2021 2105   GLUCOSEU NEGATIVE 01/08/2015 1032   HGBUR NEGATIVE 06/24/2021 2105   BILIRUBINUR NEGATIVE 06/24/2021 2105   BILIRUBINUR moderate 05/29/2013 [place] 06/24/2021 2105   PROTEINUR NEGATIVE 06/24/2021 2105   UROBILINOGEN 0.2 01/08/2015 1032   NITRITE NEGATIVE 06/24/2021 2105   LEUKOCYTESUR NEGATIVE 06/24/2021 2105    Radiological Exams on Admission: I have personally reviewed images CT Angio Head W or Wo Contrast  Result Date: 06/24/2021 CLINICAL DATA:  Aphasia, facial droop, stroke suspected EXAM:  CT ANGIOGRAPHY HEAD AND NECK TECHNIQUE: Multidetector CT imaging of the head and neck was performed using  the standard protocol during bolus administration of intravenous contrast. Multiplanar CT image reconstructions and MIPs were obtained to evaluate the vascular anatomy. Carotid stenosis measurements (when applicable) are obtained utilizing NASCET criteria, using the distal internal carotid diameter as the denominator. RADIATION DOSE REDUCTION: This exam was performed according to the departmental dose-optimization program which includes automated exposure control, adjustment of the mA and/or kV according to patient size and/or use of iterative reconstruction technique. CONTRAST:  123m OMNIPAQUE IOHEXOL 350 MG/ML SOLN COMPARISON:  No prior CTA, correlation is made with CT head 06/24/2021 FINDINGS: CT HEAD FINDINGS For noncontrast findings, please see same day CT head. CTA NECK FINDINGS Aortic arch: Standard branching. Imaged portion shows no evidence of aneurysm or dissection. No significant stenosis of the major arch vessel origins. Aortic atherosclerosis. Right carotid system: No evidence of dissection, occlusion, or hemodynamically significant stenosis (greater than 50%). Calcifications at the bifurcation, in the proximal right ICA, and in the distal right ICA are not hemodynamically significant. Left carotid system: No evidence of dissection, occlusion, or hemodynamically significant stenosis (greater than 50%). Calcifications at the bifurcation and in the left ICA are not hemodynamically significant. Vertebral arteries: No evidence of dissection, occlusion, or hemodynamically significant stenosis (greater than 50%). Skeleton: Diffusely sclerosis. Status post median sternotomy. Degenerative changes in the cervical spine. Other neck: No acute finding. Upper chest: No focal pulmonary opacity or pleural effusion. Review of the MIP images confirms the above findings CTA HEAD FINDINGS Anterior circulation: Both internal carotid arteries are patent to the termini, with moderate to severe narrowing in the right  cavernous carotid, moderate narrowing in the right supraclinoid carotid, and moderate narrowing in the left cavernous carotid. Left A1 segment is patent. 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 Normal anterior communicating artery. Anterior cerebral arteries are patent to their distal aspects. No M1 stenosis or occlusion, although the right M1 is somewhat irregular. Normal MCA bifurcations. Distal MCA branches perfused and symmetric. Posterior circulation: Vertebral arteries patent to the vertebrobasilar junction, with mild narrowing in the proximal left V4 and moderate stenosis in the distal left V4. Posterior inferior cerebellar arteries patent proximally. Basilar patent to its distal aspect but diminutive. Superior cerebellar arteries patent proximally. Possible hypoplastic P1 segments, with fetal or near fetal origin of the bilateral PCAs. PCAs perfused to their Venous sinuses: As permitted by contrast timing, patent. Anatomic variants: Fetal or near fetal origin of the bilateral PCAs. Review of the MIP images confirms the above findings IMPRESSION: 1. No intracranial large vessel occlusion. Moderate to severe narrowing of the right cavernous carotid and moderate narrowing in the right supraclinoid carotid, left cavernous carotid, and distal left V4. 2.  No hemodynamically significant stenosis in the neck. 3. Diffuse osseous sclerosis, which is nonspecific but can be seen in the setting of renal osteodystrophy, Paget's disease, and diffuse metastatic disease, among other conditions. Electronically Signed   By: AMerilyn BabaM.D.   On: 06/24/2021 20:34   CT Angio Neck W and/or Wo Contrast  Result Date: 06/24/2021 CLINICAL DATA:  Aphasia, facial droop, stroke suspected EXAM: CT ANGIOGRAPHY HEAD AND NECK TECHNIQUE: Multidetector CT imaging of the head and neck was performed using the standard protocol during bolus administration of intravenous contrast. Multiplanar CT image reconstructions and MIPs were obtained to evaluate  the vascular anatomy. Carotid stenosis measurements (when applicable) are obtained utilizing NASCET criteria, using the distal internal carotid diameter as the denominator.  RADIATION DOSE REDUCTION: This exam was performed according to the departmental dose-optimization program which includes automated exposure control, adjustment of the mA and/or kV according to patient size and/or use of iterative reconstruction technique. CONTRAST:  145m OMNIPAQUE IOHEXOL 350 MG/ML SOLN COMPARISON:  No prior CTA, correlation is made with CT head 06/24/2021 FINDINGS: CT HEAD FINDINGS For noncontrast findings, please see same day CT head. CTA NECK FINDINGS Aortic arch: Standard branching. Imaged portion shows no evidence of aneurysm or dissection. No significant stenosis of the major arch vessel origins. Aortic atherosclerosis. Right carotid system: No evidence of dissection, occlusion, or hemodynamically significant stenosis (greater than 50%). Calcifications at the bifurcation, in the proximal right ICA, and in the distal right ICA are not hemodynamically significant. Left carotid system: No evidence of dissection, occlusion, or hemodynamically significant stenosis (greater than 50%). Calcifications at the bifurcation and in the left ICA are not hemodynamically significant. Vertebral arteries: No evidence of dissection, occlusion, or hemodynamically significant stenosis (greater than 50%). Skeleton: Diffusely sclerosis. Status post median sternotomy. Degenerative changes in the cervical spine. Other neck: No acute finding. Upper chest: No focal pulmonary opacity or pleural effusion. Review of the MIP images confirms the above findings CTA HEAD FINDINGS Anterior circulation: Both internal carotid arteries are patent to the termini, with moderate to severe narrowing in the right cavernous carotid, moderate narrowing in the right supraclinoid carotid, and moderate narrowing in the left cavernous carotid. Left A1 segment is patent.  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 Normal anterior communicating artery. Anterior cerebral arteries are patent to their distal aspects. No M1 stenosis or occlusion, although the right M1 is somewhat irregular. Normal MCA bifurcations. Distal MCA branches perfused and symmetric. Posterior circulation: Vertebral arteries patent to the vertebrobasilar junction, with mild narrowing in the proximal left V4 and moderate stenosis in the distal left V4. Posterior inferior cerebellar arteries patent proximally. Basilar patent to its distal aspect but diminutive. Superior cerebellar arteries patent proximally. Possible hypoplastic P1 segments, with fetal or near fetal origin of the bilateral PCAs. PCAs perfused to their Venous sinuses: As permitted by contrast timing, patent. Anatomic variants: Fetal or near fetal origin of the bilateral PCAs. Review of the MIP images confirms the above findings IMPRESSION: 1. No intracranial large vessel occlusion. Moderate to severe narrowing of the right cavernous carotid and moderate narrowing in the right supraclinoid carotid, left cavernous carotid, and distal left V4. 2.  No hemodynamically significant stenosis in the neck. 3. Diffuse osseous sclerosis, which is nonspecific but can be seen in the setting of renal osteodystrophy, Paget's disease, and diffuse metastatic disease, among other conditions. Electronically Signed   By: AMerilyn BabaM.D.   On: 06/24/2021 20:34   CT HEAD CODE STROKE WO CONTRAST  Result Date: 06/24/2021 CLINICAL DATA:  Code stroke.  Aphasia.  Facial droop EXAM: CT HEAD WITHOUT CONTRAST TECHNIQUE: Contiguous axial images were obtained from the base of the skull through the vertex without intravenous contrast. RADIATION DOSE REDUCTION: This exam was performed according to the departmental dose-optimization program which includes automated exposure control, adjustment of the mA and/or kV according to patient size and/or use of iterative reconstruction technique.  COMPARISON:  12/27/2020 CT head FINDINGS: Brain: No evidence of acute infarction, hemorrhage, cerebral edema, mass, mass effect, or midline shift. Ventricles and sulci are normal for age. No extra-axial fluid collection. Remote infarct in the left cerebellum. Vascular: No hyperdense vessel. Skull: Negative for fracture or focal lesion. Sinuses/Orbits: Mucosal thickening in the left-greater-than-right maxillary sinus and anterior ethmoid  air cells. Other: The mastoid air cells are well aerated. ASPECTS [facility] Stroke Program Early CT Score) - Ganglionic level infarction (caudate, lentiform nuclei, internal capsule, insula, M1-M3 cortex): 7 - Supraganglionic infarction (M4-M6 cortex): 3 Total score (0-10 with 10 being normal): 10 IMPRESSION: 1. No acute intracranial process. 2. ASPECTS is 10 Code stroke imaging results were communicated on 06/24/2021 at 7:51 pm to provider R[place] via telephone, who verbally acknowledged these results. Electronically Signed   By: Merilyn Baba M.D.   On: 06/24/2021 19:51    EKG: Independently reviewed.  Sinus rhythm, new LBBB.  Assessment and Plan  Acute onset expressive aphasia LKW at 5 pm on 06/24/2021. Not a tPA candidate due to Coumadin use and INR 2.7.  CT head without evidence of hemorrhage.  CTA head and neck without evidence of LVO.  Patient was seen by teleneurology, recommended hospital admission for stroke work-up. -Neurology consulted  -Telemetry monitoring -Allow for permissive hypertension at this time -MRI of the brain without contrast -Echocardiogram -A1c 8.0 on 04/26/2021 -Lipid panel -Neurology recommending holding anticoagulation at this time -Frequent neurochecks -Head of bed 30 degrees -Euglycemia and avoid hyperthermia -PT, OT, speech therapy. -N.p.o. until cleared by bedside swallow evaluation or formal speech evaluation   Hypertension -Allow permissive hypertension at this time  OSA -Continue nightly CPAP  Insulin-dependent type 2  diabetes A1c 8.0 on 04/26/2021. -Sliding scale insulin -Pharmacy med rec pending.   Mechanical aortic valve INR 2.7. -Neurology recommending holding Coumadin at this time  Left bundle branch block Seen on EKG appears new compared to prior tracing from August 2022.  Patient is not endorsing chest pain although history limited due to expressive aphasia. -Stat troponin -Echocardiogram  Abnormal CT finding CT head and neck showing diffuse osseous sclerosis, which is nonspecific but can be seen in the setting of renal osteodystrophy, Paget's disease, and diffuse metastatic disease, among other conditions. Serum alkaline phosphatase normal. Patient is not able to give any history at this time, please readdress later.   DVT prophylaxis: SCDs Code Status: Full Code Family Communication: No family available at this time.   Consults called: Neurology (Dr. Curly [place])  Level of care: Telemetry bed Admission status: It is my clinical opinion that admission to INPATIENT is reasonable and necessary because of the expectation that this patient will require hospital care that crosses at least 2 midnights to treat this condition based on the medical complexity of the problems presented.  Given the aforementioned information, the predictability of an adverse outcome is felt to be significant.   Shela Leff MD [facility]  If 7PM-7AM, please contact night-coverage [email or web address]  06/25/2021, 2:12 AM

## 2021-06-25 NOTE — Progress Notes (Signed)
PROGRESS NOTE        PATIENT DETAILS Name: [name] Age: 76 y.o. Sex: male Date of Birth: 03-18-1945 Admit Date: 06/24/2021 Admitting Physician Shela Leff, MD PCP:O'Sullivan, Lenna Sciara, NP  Brief Summary: Patient is a 76 y.o.  male with history of mechanical aortic valve on anticoagulation with Coumadin-presenting with expressive aphasia.  Significant events: 5/26>> admit to [facility]-presenting with expressive aphasia.  INR 2.7  Significant studies: 5/26>> CT head: No acute intracranial process. 5/26>> CTA head: No intracranial large vessel occlusion, moderate to severe narrowing of right cavernous carotid, moderate narrowing in the right supraclinoid carotid, left cavernous carotid and distal left V4. 5/26>> CTA neck: No hemodynamically significant stenosis in the neck 5/27>> LDL: 65  Significant microbiology data: 5/26>> COVID/influenza PCR: Negative  Procedures:   Consults: None   Subjective: Lying comfortably in bed-denies any chest pain or shortness of breath.  Objective: Vitals: Blood pressure (!) 161/71, pulse 62, temperature 98.1 F (36.7 C), temperature source Oral, resp. rate 18, height '5\' 10"'$  (1.778 m), weight 102.2 kg, SpO2 96 %.   Exam: Gen Exam:Alert awake-not in any distress.+ Expressive aphasia. HEENT:atraumatic, normocephalic Chest: B/L clear to auscultation anteriorly MWU:X3K4 regular.+ Metallic click Abdomen:soft non tender, non distended Extremities:no edema Neurology: Non focal Skin: no rash  Pertinent Labs/Radiology:    Latest Ref Rng & Units 06/24/2021    7:40 PM 02/04/2021    2:44 PM 12/28/2020    9:41 AM  CBC  WBC 4.0 - 10.5 K/uL 8.4   8.5   7.5    Hemoglobin 13.0 - 17.0 g/dL 15.2   16.0   14.7    Hematocrit 39.0 - 52.0 % 44.3   46.7   44.6    Platelets 150 - 400 K/uL 235   238   210.0      Lab Results  Component Value Date   NA 137 06/25/2021   K 3.4 (L) 06/25/2021   CL 103 06/25/2021   CO2 24  06/25/2021      Assessment/Plan: Expressive aphasia-likely embolic CVA: Continues to have expressive aphasia-essentially unchanged from yesterday-awaiting MRI brain.  Awaiting further recommendations from stroke MD.  History of mechanical aortic valve replacement [facility] Jude aortic valve)-2005: On Coumadin with target INR of 2-3-if MRI does indeed confirm CVA-he may need target INR to be changed to 2.5-3.5.  We will seek cardiology opinion if he indeed does have a CVA.  Awaiting echo as well.  History of CAD s/p CABG 2005: No anginal symptoms  HTN: Now some amount of permissive hypertension for now-we will gradually lower.  Hypokalemia: Replete and recheck.  AKI: Mild-improving with supportive care-likely hemodynamically mediated.  OSA: CPAP nightly  Diffuse osseous sclerosis: Discussed with spouse/patient-further work-up deferred to the outpatient setting.  Per spouse-when patient had back surgery several years ago-he was noted to have "bone issues".  BMI: Estimated body mass index is 32.33 kg/m as calculated from the following:   Height as of this encounter: '5\' 10"'$  (1.778 m).   Weight as of this encounter: 102.2 kg.   Code status:   Code Status: Full Code   DVT Prophylaxis: SCD's Start: 06/25/21 4010   Family Communication: Spouse at bedside   Disposition Plan: Status is: Inpatient Remains inpatient appropriate because: Expressive aphasia likely due to embolic CVA-has a mechanical aortic valve-work-up in progress-not yet stable for discharge.   Planned  Discharge Destination:Home   Diet: Diet Order             Diet NPO time specified  Diet effective now                     Antimicrobial agents: Anti-infectives (From admission, onward)    None        MEDICATIONS: Scheduled Meds:   stroke: early stages of recovery book   Does not apply Once   insulin aspart  0-9 Units Subcutaneous Q4H   potassium chloride  40 mEq Oral Once   Continuous  Infusions: PRN Meds:.acetaminophen **OR** acetaminophen (TYLENOL) oral liquid 160 mg/5 mL **OR** acetaminophen   I have personally reviewed following labs and imaging studies  LABORATORY DATA: CBC: Recent Labs  Lab 06/24/21 1940  WBC 8.4  NEUTROABS 5.8  HGB 15.2  HCT 44.3  MCV 87.7  PLT 673    Basic Metabolic Panel: Recent Labs  Lab 06/24/21 1940 06/25/21 0358  NA 137 137  K 3.5 3.4*  CL 103 103  CO2 25 24  GLUCOSE 182* 151*  BUN 30* 26*  CREATININE 1.50* 1.35*  CALCIUM 9.0 9.0    GFR: Estimated Creatinine Clearance: 55.8 mL/min (A) (by C-G formula based on SCr of 1.35 mg/dL (H)).  Liver Function Tests: Recent Labs  Lab 06/24/21 1940  AST 32  ALT 24  ALKPHOS 56  BILITOT 0.5  PROT 7.9  ALBUMIN 4.1   No results for input(s): LIPASE, AMYLASE in the last 168 hours. No results for input(s): AMMONIA in the last 168 hours.  Coagulation Profile: Recent Labs  Lab 06/24/21 1940  INR 2.7*    Cardiac Enzymes: No results for input(s): CKTOTAL, CKMB, CKMBINDEX, TROPONINI in the last 168 hours.  BNP (last 3 results) No results for input(s): PROBNP in the last 8760 hours.  Lipid Profile: Recent Labs    06/25/21 0358  CHOL 143  HDL 30*  LDLCALC 65  TRIG 240*  CHOLHDL 4.8    Thyroid Function Tests: No results for input(s): TSH, T4TOTAL, FREET4, T3FREE, THYROIDAB in the last 72 hours.  Anemia Panel: No results for input(s): VITAMINB12, FOLATE, FERRITIN, TIBC, IRON, RETICCTPCT in the last 72 hours.  Urine analysis:    Component Value Date/Time   COLORURINE YELLOW 06/24/2021 2105   APPEARANCEUR CLEAR 06/24/2021 2105   LABSPEC 1.015 06/24/2021 2105   PHURINE 7.0 06/24/2021 2105   GLUCOSEU >=500 (A) 06/24/2021 2105   GLUCOSEU NEGATIVE 01/08/2015 1032   HGBUR NEGATIVE 06/24/2021 2105   BILIRUBINUR NEGATIVE 06/24/2021 2105   BILIRUBINUR moderate 05/29/2013 [place] 06/24/2021 2105   PROTEINUR NEGATIVE 06/24/2021 2105    UROBILINOGEN 0.2 01/08/2015 1032   NITRITE NEGATIVE 06/24/2021 2105   LEUKOCYTESUR NEGATIVE 06/24/2021 2105    Sepsis Labs: Lactic Acid, Venous No results found for: LATICACIDVEN  MICROBIOLOGY: Recent Results (from the past 240 hour(s))  Resp Panel by RT-PCR (Flu A&B, Covid) Anterior Nasal Swab     Status: None   Collection Time: 06/24/21  9:20 PM   Specimen: Anterior Nasal Swab  Result Value Ref Range Status   SARS Coronavirus 2 by RT PCR NEGATIVE NEGATIVE Final    Comment: (NOTE) SARS-CoV-2 target nucleic acids are NOT DETECTED.  The SARS-CoV-2 RNA is generally detectable in upper respiratory specimens during the acute phase of infection. The lowest concentration of SARS-CoV-2 viral copies this assay can detect is 138 copies/mL. A negative result does not preclude SARS-Cov-2 infection and should not be  used as the sole basis for treatment or other patient management decisions. A negative result may occur with  improper specimen collection/handling, submission of specimen other than nasopharyngeal swab, presence of viral mutation(s) within the areas targeted by this assay, and inadequate number of viral copies(<138 copies/mL). A negative result must be combined with clinical observations, patient history, and epidemiological information. The expected result is Negative.  Fact Sheet for Patients:  EntrepreneurPulse.com.au  Fact Sheet for Healthcare Providers:  IncredibleEmployment.be  This test is no t yet approved or cleared by the [place] FDA and  has been authorized for detection and/or diagnosis of SARS-CoV-2 by FDA under an Emergency Use Authorization (EUA). This EUA will remain  in effect (meaning this test can be used) for the duration of the COVID-19 declaration under Section 564(b)(1) of the Act, 21 U.S.C.section 360bbb-3(b)(1), unless the authorization is terminated  or revoked sooner.       Influenza A by PCR NEGATIVE  NEGATIVE Final   Influenza B by PCR NEGATIVE NEGATIVE Final    Comment: (NOTE) The Xpert Xpress SARS-CoV-2/FLU/RSV plus assay is intended as an aid in the diagnosis of influenza from Nasopharyngeal swab specimens and should not be used as a sole basis for treatment. Nasal washings and aspirates are unacceptable for Xpert Xpress SARS-CoV-2/FLU/RSV testing.  Fact Sheet for Patients: EntrepreneurPulse.com.au  Fact Sheet for Healthcare Providers: IncredibleEmployment.be  This test is not yet approved or cleared by the [place] FDA and has been authorized for detection and/or diagnosis of SARS-CoV-2 by FDA under an Emergency Use Authorization (EUA). This EUA will remain in effect (meaning this test can be used) for the duration of the COVID-19 declaration under Section 564(b)(1) of the Act, 21 U.S.C. section 360bbb-3(b)(1), unless the authorization is terminated or revoked.  Performed at [facility], [address] [place], [place] [address]     RADIOLOGY STUDIES/RESULTS: CT Angio Head W or Wo Contrast  Result Date: 06/24/2021 CLINICAL DATA:  Aphasia, facial droop, stroke suspected EXAM: CT ANGIOGRAPHY HEAD AND NECK TECHNIQUE: Multidetector CT imaging of the head and neck was performed using the standard protocol during bolus administration of intravenous contrast. Multiplanar CT image reconstructions and MIPs were obtained to evaluate the vascular anatomy. Carotid stenosis measurements (when applicable) are obtained utilizing NASCET criteria, using the distal internal carotid diameter as the denominator. RADIATION DOSE REDUCTION: This exam was performed according to the departmental dose-optimization program which includes automated exposure control, adjustment of the mA and/or kV according to patient size and/or use of iterative reconstruction technique. CONTRAST:  126m OMNIPAQUE IOHEXOL 350 MG/ML SOLN COMPARISON:  No prior CTA,  correlation is made with CT head 06/24/2021 FINDINGS: CT HEAD FINDINGS For noncontrast findings, please see same day CT head. CTA NECK FINDINGS Aortic arch: Standard branching. Imaged portion shows no evidence of aneurysm or dissection. No significant stenosis of the major arch vessel origins. Aortic atherosclerosis. Right carotid system: No evidence of dissection, occlusion, or hemodynamically significant stenosis (greater than 50%). Calcifications at the bifurcation, in the proximal right ICA, and in the distal right ICA are not hemodynamically significant. Left carotid system: No evidence of dissection, occlusion, or hemodynamically significant stenosis (greater than 50%). Calcifications at the bifurcation and in the left ICA are not hemodynamically significant. Vertebral arteries: No evidence of dissection, occlusion, or hemodynamically significant stenosis (greater than 50%). Skeleton: Diffusely sclerosis. Status post median sternotomy. Degenerative changes in the cervical spine. Other neck: No acute finding. Upper chest: No focal pulmonary opacity or pleural  effusion. Review of the MIP images confirms the above findings CTA HEAD FINDINGS Anterior circulation: Both internal carotid arteries are patent to the termini, with moderate to severe narrowing in the right cavernous carotid, moderate narrowing in the right supraclinoid carotid, and moderate narrowing in the left cavernous carotid. Left A1 segment is patent. 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 Normal anterior communicating artery. Anterior cerebral arteries are patent to their distal aspects. No M1 stenosis or occlusion, although the right M1 is somewhat irregular. Normal MCA bifurcations. Distal MCA branches perfused and symmetric. Posterior circulation: Vertebral arteries patent to the vertebrobasilar junction, with mild narrowing in the proximal left V4 and moderate stenosis in the distal left V4. Posterior inferior cerebellar arteries patent proximally. Basilar  patent to its distal aspect but diminutive. Superior cerebellar arteries patent proximally. Possible hypoplastic P1 segments, with fetal or near fetal origin of the bilateral PCAs. PCAs perfused to their Venous sinuses: As permitted by contrast timing, patent. Anatomic variants: Fetal or near fetal origin of the bilateral PCAs. Review of the MIP images confirms the above findings IMPRESSION: 1. No intracranial large vessel occlusion. Moderate to severe narrowing of the right cavernous carotid and moderate narrowing in the right supraclinoid carotid, left cavernous carotid, and distal left V4. 2.  No hemodynamically significant stenosis in the neck. 3. Diffuse osseous sclerosis, which is nonspecific but can be seen in the setting of renal osteodystrophy, Paget's disease, and diffuse metastatic disease, among other conditions. Electronically Signed   By: Merilyn Baba M.D.   On: 06/24/2021 20:34   CT Angio Neck W and/or Wo Contrast  Result Date: 06/24/2021 CLINICAL DATA:  Aphasia, facial droop, stroke suspected EXAM: CT ANGIOGRAPHY HEAD AND NECK TECHNIQUE: Multidetector CT imaging of the head and neck was performed using the standard protocol during bolus administration of intravenous contrast. Multiplanar CT image reconstructions and MIPs were obtained to evaluate the vascular anatomy. Carotid stenosis measurements (when applicable) are obtained utilizing NASCET criteria, using the distal internal carotid diameter as the denominator. RADIATION DOSE REDUCTION: This exam was performed according to the departmental dose-optimization program which includes automated exposure control, adjustment of the mA and/or kV according to patient size and/or use of iterative reconstruction technique. CONTRAST:  116m OMNIPAQUE IOHEXOL 350 MG/ML SOLN COMPARISON:  No prior CTA, correlation is made with CT head 06/24/2021 FINDINGS: CT HEAD FINDINGS For noncontrast findings, please see same day CT head. CTA NECK FINDINGS Aortic  arch: Standard branching. Imaged portion shows no evidence of aneurysm or dissection. No significant stenosis of the major arch vessel origins. Aortic atherosclerosis. Right carotid system: No evidence of dissection, occlusion, or hemodynamically significant stenosis (greater than 50%). Calcifications at the bifurcation, in the proximal right ICA, and in the distal right ICA are not hemodynamically significant. Left carotid system: No evidence of dissection, occlusion, or hemodynamically significant stenosis (greater than 50%). Calcifications at the bifurcation and in the left ICA are not hemodynamically significant. Vertebral arteries: No evidence of dissection, occlusion, or hemodynamically significant stenosis (greater than 50%). Skeleton: Diffusely sclerosis. Status post median sternotomy. Degenerative changes in the cervical spine. Other neck: No acute finding. Upper chest: No focal pulmonary opacity or pleural effusion. Review of the MIP images confirms the above findings CTA HEAD FINDINGS Anterior circulation: Both internal carotid arteries are patent to the termini, with moderate to severe narrowing in the right cavernous carotid, moderate narrowing in the right supraclinoid carotid, and moderate narrowing in the left cavernous carotid. Left A1 segment is patent. 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  Normal anterior communicating artery. Anterior cerebral arteries are patent to their distal aspects. No M1 stenosis or occlusion, although the right M1 is somewhat irregular. Normal MCA bifurcations. Distal MCA branches perfused and symmetric. Posterior circulation: Vertebral arteries patent to the vertebrobasilar junction, with mild narrowing in the proximal left V4 and moderate stenosis in the distal left V4. Posterior inferior cerebellar arteries patent proximally. Basilar patent to its distal aspect but diminutive. Superior cerebellar arteries patent proximally. Possible hypoplastic P1 segments, with fetal or near fetal  origin of the bilateral PCAs. PCAs perfused to their Venous sinuses: As permitted by contrast timing, patent. Anatomic variants: Fetal or near fetal origin of the bilateral PCAs. Review of the MIP images confirms the above findings IMPRESSION: 1. No intracranial large vessel occlusion. Moderate to severe narrowing of the right cavernous carotid and moderate narrowing in the right supraclinoid carotid, left cavernous carotid, and distal left V4. 2.  No hemodynamically significant stenosis in the neck. 3. Diffuse osseous sclerosis, which is nonspecific but can be seen in the setting of renal osteodystrophy, Paget's disease, and diffuse metastatic disease, among other conditions. Electronically Signed   By: Merilyn Baba M.D.   On: 06/24/2021 20:34   CT HEAD CODE STROKE WO CONTRAST  Result Date: 06/24/2021 CLINICAL DATA:  Code stroke.  Aphasia.  Facial droop EXAM: CT HEAD WITHOUT CONTRAST TECHNIQUE: Contiguous axial images were obtained from the base of the skull through the vertex without intravenous contrast. RADIATION DOSE REDUCTION: This exam was performed according to the departmental dose-optimization program which includes automated exposure control, adjustment of the mA and/or kV according to patient size and/or use of iterative reconstruction technique. COMPARISON:  12/27/2020 CT head FINDINGS: Brain: No evidence of acute infarction, hemorrhage, cerebral edema, mass, mass effect, or midline shift. Ventricles and sulci are normal for age. No extra-axial fluid collection. Remote infarct in the left cerebellum. Vascular: No hyperdense vessel. Skull: Negative for fracture or focal lesion. Sinuses/Orbits: Mucosal thickening in the left-greater-than-right maxillary sinus and anterior ethmoid air cells. Other: The mastoid air cells are well aerated. ASPECTS [facility] Stroke Program Early CT Score) - Ganglionic level infarction (caudate, lentiform nuclei, internal capsule, insula, M1-M3 cortex): 7 - Supraganglionic  infarction (M4-M6 cortex): 3 Total score (0-10 with 10 being normal): 10 IMPRESSION: 1. No acute intracranial process. 2. ASPECTS is 10 Code stroke imaging results were communicated on 06/24/2021 at 7:51 pm to provider [place] via telephone, who verbally acknowledged these results. Electronically Signed   By: Merilyn Baba M.D.   On: 06/24/2021 19:51     LOS: 0 days   Oren Binet, MD  [facility]    To contact the attending provider between 7A-7P or the covering provider during after hours 7P-7A, please log into the web site [email or web address] and access using universal [place] password for that web site. If you do not have the password, please call the hospital operator.  06/25/2021, 9:15 AM

## 2021-06-25 NOTE — Progress Notes (Signed)
STROKE TEAM PROGRESS NOTE   SUBJECTIVE (INTERVAL HISTORY) His wife and RN are at the bedside.  Overall his condition is gradually improving.  Per RN, patient's speech improving, already better than early this morning.  However, patient still has word finding difficulty and hesitancy of speech, however able to read and follow some commands.  No arm or leg weakness.   OBJECTIVE Temp:  [98 F (36.7 C)-98.4 F (36.9 C)] 98 F (36.7 C) (05/27 1125) Pulse Rate:  [56-77] 65 (05/27 1125) Cardiac Rhythm: Sinus bradycardia (05/27 0800) Resp:  [11-20] 18 (05/27 1125) BP: (152-198)/(65-106) 178/76 (05/27 1125) SpO2:  [96 %-98 %] 96 % (05/27 1125) Weight:  [97.5 kg-102.2 kg] 102.2 kg (05/26 2005)  Recent Labs  Lab 06/24/21 1930 06/25/21 0356 06/25/21 0801 06/25/21 1159  GLUCAP 172* 156* 179* 198*   Recent Labs  Lab 06/24/21 1940 06/25/21 0358  NA 137 137  K 3.5 3.4*  CL 103 103  CO2 25 24  GLUCOSE 182* 151*  BUN 30* 26*  CREATININE 1.50* 1.35*  CALCIUM 9.0 9.0   Recent Labs  Lab 06/24/21 1940  AST 32  ALT 24  ALKPHOS 56  BILITOT 0.5  PROT 7.9  ALBUMIN 4.1   Recent Labs  Lab 06/24/21 1940  WBC 8.4  NEUTROABS 5.8  HGB 15.2  HCT 44.3  MCV 87.7  PLT 235   No results for input(s): CKTOTAL, CKMB, CKMBINDEX, TROPONINI in the last 168 hours. Recent Labs    06/24/21 1940  LABPROT 28.1*  INR 2.7*   Recent Labs    06/24/21 2105  COLORURINE YELLOW  LABSPEC 1.015  PHURINE 7.0  GLUCOSEU >=500*  HGBUR NEGATIVE  BILIRUBINUR NEGATIVE  KETONESUR NEGATIVE  PROTEINUR NEGATIVE  NITRITE NEGATIVE  LEUKOCYTESUR NEGATIVE       Component Value Date/Time   CHOL 143 06/25/2021 0358   CHOL 162 03/24/2021 0815   TRIG 240 (H) 06/25/2021 0358   HDL 30 (L) 06/25/2021 0358   HDL 29 (L) 03/24/2021 0815   CHOLHDL 4.8 06/25/2021 0358   VLDL 48 (H) 06/25/2021 0358   LDLCALC 65 06/25/2021 0358   LDLCALC 77 03/24/2021 0815   LDLCALC 99 09/22/2019 1119   Lab Results  Component  Value Date   HGBA1C 8.0 (A) 04/26/2021      Component Value Date/Time   LABOPIA NONE DETECTED 06/24/2021 2104   COCAINSCRNUR NONE DETECTED 06/24/2021 2104   LABBENZ NONE DETECTED 06/24/2021 2104   AMPHETMU NONE DETECTED 06/24/2021 2104   THCU NONE DETECTED 06/24/2021 2104   LABBARB NONE DETECTED 06/24/2021 2104    Recent Labs  Lab 06/24/21 1940  ETH <10    I have personally reviewed the radiological images below and agree with the radiology interpretations.  CT Angio Head W or Wo Contrast  Result Date: 06/24/2021 CLINICAL DATA:  Aphasia, facial droop, stroke suspected EXAM: CT ANGIOGRAPHY HEAD AND NECK TECHNIQUE: Multidetector CT imaging of the head and neck was performed using the standard protocol during bolus administration of intravenous contrast. Multiplanar CT image reconstructions and MIPs were obtained to evaluate the vascular anatomy. Carotid stenosis measurements (when applicable) are obtained utilizing NASCET criteria, using the distal internal carotid diameter as the denominator. RADIATION DOSE REDUCTION: This exam was performed according to the departmental dose-optimization program which includes automated exposure control, adjustment of the mA and/or kV according to patient size and/or use of iterative reconstruction technique. CONTRAST:  122m OMNIPAQUE IOHEXOL 350 MG/ML SOLN COMPARISON:  No prior CTA, correlation is made with CT  head 06/24/2021 FINDINGS: CT HEAD FINDINGS For noncontrast findings, please see same day CT head. CTA NECK FINDINGS Aortic arch: Standard branching. Imaged portion shows no evidence of aneurysm or dissection. No significant stenosis of the major arch vessel origins. Aortic atherosclerosis. Right carotid system: No evidence of dissection, occlusion, or hemodynamically significant stenosis (greater than 50%). Calcifications at the bifurcation, in the proximal right ICA, and in the distal right ICA are not hemodynamically significant. Left carotid system:  No evidence of dissection, occlusion, or hemodynamically significant stenosis (greater than 50%). Calcifications at the bifurcation and in the left ICA are not hemodynamically significant. Vertebral arteries: No evidence of dissection, occlusion, or hemodynamically significant stenosis (greater than 50%). Skeleton: Diffusely sclerosis. Status post median sternotomy. Degenerative changes in the cervical spine. Other neck: No acute finding. Upper chest: No focal pulmonary opacity or pleural effusion. Review of the MIP images confirms the above findings CTA HEAD FINDINGS Anterior circulation: Both internal carotid arteries are patent to the termini, with moderate to severe narrowing in the right cavernous carotid, moderate narrowing in the right supraclinoid carotid, and moderate narrowing in the left cavernous carotid. Left A1 segment is patent. 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 Normal anterior communicating artery. Anterior cerebral arteries are patent to their distal aspects. No M1 stenosis or occlusion, although the right M1 is somewhat irregular. Normal MCA bifurcations. Distal MCA branches perfused and symmetric. Posterior circulation: Vertebral arteries patent to the vertebrobasilar junction, with mild narrowing in the proximal left V4 and moderate stenosis in the distal left V4. Posterior inferior cerebellar arteries patent proximally. Basilar patent to its distal aspect but diminutive. Superior cerebellar arteries patent proximally. Possible hypoplastic P1 segments, with fetal or near fetal origin of the bilateral PCAs. PCAs perfused to their Venous sinuses: As permitted by contrast timing, patent. Anatomic variants: Fetal or near fetal origin of the bilateral PCAs. Review of the MIP images confirms the above findings IMPRESSION: 1. No intracranial large vessel occlusion. Moderate to severe narrowing of the right cavernous carotid and moderate narrowing in the right supraclinoid carotid, left cavernous carotid, and  distal left V4. 2.  No hemodynamically significant stenosis in the neck. 3. Diffuse osseous sclerosis, which is nonspecific but can be seen in the setting of renal osteodystrophy, Paget's disease, and diffuse metastatic disease, among other conditions. Electronically Signed   By: Merilyn Baba M.D.   On: 06/24/2021 20:34   CT Angio Neck W and/or Wo Contrast  Result Date: 06/24/2021 CLINICAL DATA:  Aphasia, facial droop, stroke suspected EXAM: CT ANGIOGRAPHY HEAD AND NECK TECHNIQUE: Multidetector CT imaging of the head and neck was performed using the standard protocol during bolus administration of intravenous contrast. Multiplanar CT image reconstructions and MIPs were obtained to evaluate the vascular anatomy. Carotid stenosis measurements (when applicable) are obtained utilizing NASCET criteria, using the distal internal carotid diameter as the denominator. RADIATION DOSE REDUCTION: This exam was performed according to the departmental dose-optimization program which includes automated exposure control, adjustment of the mA and/or kV according to patient size and/or use of iterative reconstruction technique. CONTRAST:  118m OMNIPAQUE IOHEXOL 350 MG/ML SOLN COMPARISON:  No prior CTA, correlation is made with CT head 06/24/2021 FINDINGS: CT HEAD FINDINGS For noncontrast findings, please see same day CT head. CTA NECK FINDINGS Aortic arch: Standard branching. Imaged portion shows no evidence of aneurysm or dissection. No significant stenosis of the major arch vessel origins. Aortic atherosclerosis. Right carotid system: No evidence of dissection, occlusion, or hemodynamically significant stenosis (greater than 50%). Calcifications at  the bifurcation, in the proximal right ICA, and in the distal right ICA are not hemodynamically significant. Left carotid system: No evidence of dissection, occlusion, or hemodynamically significant stenosis (greater than 50%). Calcifications at the bifurcation and in the left ICA  are not hemodynamically significant. Vertebral arteries: No evidence of dissection, occlusion, or hemodynamically significant stenosis (greater than 50%). Skeleton: Diffusely sclerosis. Status post median sternotomy. Degenerative changes in the cervical spine. Other neck: No acute finding. Upper chest: No focal pulmonary opacity or pleural effusion. Review of the MIP images confirms the above findings CTA HEAD FINDINGS Anterior circulation: Both internal carotid arteries are patent to the termini, with moderate to severe narrowing in the right cavernous carotid, moderate narrowing in the right supraclinoid carotid, and moderate narrowing in the left cavernous carotid. Left A1 segment is patent. 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 Normal anterior communicating artery. Anterior cerebral arteries are patent to their distal aspects. No M1 stenosis or occlusion, although the right M1 is somewhat irregular. Normal MCA bifurcations. Distal MCA branches perfused and symmetric. Posterior circulation: Vertebral arteries patent to the vertebrobasilar junction, with mild narrowing in the proximal left V4 and moderate stenosis in the distal left V4. Posterior inferior cerebellar arteries patent proximally. Basilar patent to its distal aspect but diminutive. Superior cerebellar arteries patent proximally. Possible hypoplastic P1 segments, with fetal or near fetal origin of the bilateral PCAs. PCAs perfused to their Venous sinuses: As permitted by contrast timing, patent. Anatomic variants: Fetal or near fetal origin of the bilateral PCAs. Review of the MIP images confirms the above findings IMPRESSION: 1. No intracranial large vessel occlusion. Moderate to severe narrowing of the right cavernous carotid and moderate narrowing in the right supraclinoid carotid, left cavernous carotid, and distal left V4. 2.  No hemodynamically significant stenosis in the neck. 3. Diffuse osseous sclerosis, which is nonspecific but can be seen in the setting  of renal osteodystrophy, Paget's disease, and diffuse metastatic disease, among other conditions. Electronically Signed   By: Merilyn Baba M.D.   On: 06/24/2021 20:34   MR BRAIN WO CONTRAST  Result Date: 06/25/2021 CLINICAL DATA:  Neuro deficit with stroke suspected EXAM: MRI HEAD WITHOUT CONTRAST TECHNIQUE: Multiplanar, multiecho pulse sequences of the brain and surrounding structures were obtained without intravenous contrast. COMPARISON:  Head CT and CTA from yesterday FINDINGS: Brain: Acute infarct affecting cortex at the upper left insula and adjacent frontal lobe. Mild extension into the subjacent white matter at the level of the frontal lobe. No infarct in the separate vascular distribution. Chronic small vessel ischemia in the cerebral white matter which is mild for age. Multiple small remote cerebellar infarcts. Cerebral volume loss which is mild for age. No hemorrhage, hydrocephalus, or masslike finding Vascular: Major flow voids are preserved Skull and upper cervical spine: Normal marrow signal Sinuses/Orbits: Small retention cysts along the floors of the maxillary sinuses. IMPRESSION: 1. Small to moderate left MCA branch infarct affecting left insular and frontal cortex. 2. Small chronic cerebellar infarcts. Mild chronic microvascular ischemia and cerebral volume loss for age. Electronically Signed   By: Jorje Guild M.D.   On: 06/25/2021 09:23   CT HEAD CODE STROKE WO CONTRAST  Result Date: 06/24/2021 CLINICAL DATA:  Code stroke.  Aphasia.  Facial droop EXAM: CT HEAD WITHOUT CONTRAST TECHNIQUE: Contiguous axial images were obtained from the base of the skull through the vertex without intravenous contrast. RADIATION DOSE REDUCTION: This exam was performed according to the departmental dose-optimization program which includes automated exposure control, adjustment of  the mA and/or kV according to patient size and/or use of iterative reconstruction technique. COMPARISON:  12/27/2020 CT head  FINDINGS: Brain: No evidence of acute infarction, hemorrhage, cerebral edema, mass, mass effect, or midline shift. Ventricles and sulci are normal for age. No extra-axial fluid collection. Remote infarct in the left cerebellum. Vascular: No hyperdense vessel. Skull: Negative for fracture or focal lesion. Sinuses/Orbits: Mucosal thickening in the left-greater-than-right maxillary sinus and anterior ethmoid air cells. Other: The mastoid air cells are well aerated. ASPECTS [facility] Stroke Program Early CT Score) - Ganglionic level infarction (caudate, lentiform nuclei, internal capsule, insula, M1-M3 cortex): 7 - Supraganglionic infarction (M4-M6 cortex): 3 Total score (0-10 with 10 being normal): 10 IMPRESSION: 1. No acute intracranial process. 2. ASPECTS is 10 Code stroke imaging results were communicated on 06/24/2021 at 7:51 pm to provider Rancour via telephone, who verbally acknowledged these results. Electronically Signed   By: Merilyn Baba M.D.   On: 06/24/2021 19:51     PHYSICAL EXAM  Temp:  [98 F (36.7 C)-98.4 F (36.9 C)] 98 F (36.7 C) (05/27 1125) Pulse Rate:  [56-77] 65 (05/27 1125) Resp:  [11-20] 18 (05/27 1125) BP: (152-198)/(65-106) 178/76 (05/27 1125) SpO2:  [96 %-98 %] 96 % (05/27 1125) Weight:  [97.5 kg-102.2 kg] 102.2 kg (05/26 2005)  General - Well nourished, well developed, in no apparent distress.  Ophthalmologic - fundi not visualized due to noncooperation.  Cardiovascular - Regular rhythm and rate.  Mental Status -  Level of arousal and orientation to time, place, and person were intact. Language nonfluent with word finding difficulty and hesitancy of speech, partial expressive aphasia but able to name and repeat and read, follows all simple commands. Fund of Knowledge was assessed and was intact.  Cranial Nerves II - XII - II - Visual field intact OU. III, IV, VI - Extraocular movements intact. V - Facial sensation intact bilaterally. VII - Facial movement intact  bilaterally. VIII - Hearing & vestibular intact bilaterally. X - Palate elevates symmetrically. XI - Chin turning & shoulder shrug intact bilaterally. XII - Tongue protrusion intact.  Motor Strength - The patient's strength was normal in all extremities and pronator drift was absent.  Bulk was normal and fasciculations were absent.   Motor Tone - Muscle tone was assessed at the neck and appendages and was normal.  Reflexes - The patient's reflexes were symmetrical in all extremities and he had no pathological reflexes.  Sensory - Light touch, temperature/pinprick were assessed and were symmetrical.    Coordination - The patient had normal movements in the hands and feet with no ataxia or dysmetria.  Tremor was absent.  Gait and Station - deferred.   ASSESSMENT/PLAN [name] is a 76 y.o. male with history of hypertension, hyperlipidemia, diabetes, obesity, OSA, mechanical valve on Coumadin, CAD status post CABG admitted for aphasia. No tPA given due to on Coumadin with therapeutic INR.    Stroke:  left MCA patchy infarct embolic pattern likely secondary to mechanical valve although on Coumadin with therapeutic INR CT no acute abnormality CT head and neck bilateral ICA siphon and bulb atherosclerosis, right more than left MRI left MCA patchy small infarcts 2D Echo pending LDL 65 HgbA1c pending Coumadin for VTE prophylaxis warfarin daily prior to admission, now on warfarin daily.  Recommend to increase INR goal to 2.5-3.5. Patient counseled to be compliant with his antithrombotic medications Ongoing aggressive stroke risk factor management Therapy recommendations: None Disposition: Pending  Mechanical heart valve On Coumadin PTA  INR was 2.7 on presentation, therapeutic Given embolic stroke with Coumadin, recommend to increase INR goal from 2-3 to 2.5-3.5.  Diabetes HgbA1c pending goal < 7.0 Controlled CBG monitoring SSI DM education and close PCP follow  up  Hypertension Stable Long term BP goal normotensive  Hyperlipidemia Home meds: Tricor 145 and Crestor 40 LDL 65, goal < 70 Now on home meds with Tricor and Crestor 40 Continue statin at discharge  Other Stroke Risk Factors Advanced age Obesity, Body mass index is 32.33 kg/m.  Obstructive sleep apnea, on CPAP at home  Other Active Problems Creatinine 1.35, CKD 3  Hospital day # 0    Rosalin Hawking, MD PhD Stroke Neurology 06/25/2021 1:58 PM    To contact Stroke Continuity provider, please refer to [email or web address] After hours, contact General Neurology

## 2021-06-25 NOTE — Progress Notes (Signed)
BSE completed, full report to follow.  Pt with functional oropharyngeal swallow based on clinical swallow eval.  Recommend regular/thin diet.  SLE pending.    Kathleen Lime, MS [facility] SLP Acute Rehab Services Office [phone] Pager [phone]

## 2021-06-25 NOTE — Evaluation (Signed)
Clinical/Bedside Swallow Evaluation Patient Details  Name: [name] MRN: [id number] Date of Birth: 28-Jun-1945  Today's Date: 06/25/2021 Time: SLP Start Time (ACUTE ONLY): 58 SLP Stop Time (ACUTE ONLY): 1025 SLP Time Calculation (min) (ACUTE ONLY): 15 min  Past Medical History:  Past Medical History:  Diagnosis Date   Allergy    Aortic stenosis    Arthritis    CAD (coronary artery disease)    cabg   Cancer ([place]) 01/30/2009   prostate, skin   Chronic kidney disease    Complication of anesthesia    Erectile dysfunction    Heart murmur    Hemochromatosis 02/14/2011   Hemorrhoids    History of gout    History of hepatitis 01/31/1972   Hyperlipidemia    Hypertension    Hypertr obst cardiomyop    Mechanical heart valve present    Manufacturer: [employer] #: [id number]  Model #: [id number]. Card states MRI compatible with 3 teslas or less.   Obesity    moderate   PONV (postoperative nausea and vomiting)    after CABG- slow to wake up   Sensorineural hearing loss    Sleep apnea    Stroke [facility])    Old stroke - patient was not aware.   Type II or unspecified type diabetes mellitus without mention of complication, not stated as uncontrolled    Past Surgical History:  Past Surgical History:  Procedure Laterality Date   AORTIC VALVE REPLACEMENT  11/14/2003   [facility]   APPENDECTOMY  1990   CHOLECYSTECTOMY  1990   CORONARY ARTERY BYPASS GRAFT  10/2003   HERNIA REPAIR  1999   right, inguinal   HERNIA REPAIR  2002   left, inguinal   HIP SURGERY  2006   right hip   LUMBAR LAMINECTOMY/DECOMPRESSION MICRODISCECTOMY Bilateral 02/04/2021   Procedure: Bilateral Lumbar Two-Three Laminectomy;  Surgeon: Kristeen Miss, MD;  Location: [place];  Service: Neurosurgery;  Laterality: Bilateral;  3C/RM 107   PILONIDAL CYST EXCISION  1964   prostate seed implant  3/12   TONSILLECTOMY  childhood   HPI:  76 yo male adm to [facility] with speech deficits starting at 1700 on  06/24/2021.  Pt has h/o mechanical aortic valve replacement, back surgery in January 2023, HTN, gout, DM, obesity, old CVA.  Pt for swallow/speech evaluation.    Assessment / Plan / Recommendation  Clinical Impression  Pt with functional oropharyngeal swallow based on clinical swallow eval.  3 ounce [facility] swallow screen completed - which he easily passed.  Laryngeal elevation appreciated and no indication of aspiration or dysphagia with all po observed.  Recommend regular/thin diet with general precautions. SLP Visit Diagnosis: Dysphagia, unspecified (R13.10)    Aspiration Risk  No limitations    Diet Recommendation Regular;Thin liquid   Liquid Administration via: Cup;Straw Medication Administration: Whole meds with liquid Supervision: Patient able to self feed Compensations: Slow rate;Small sips/bites Postural Changes: Seated upright at 90 degrees;Remain upright for at least 30 minutes after po intake    Other  Recommendations Oral Care Recommendations: Oral care BID    Recommendations for follow up therapy are one component of a multi-disciplinary discharge planning process, led by the attending physician.  Recommendations may be updated based on patient status, additional functional criteria and insurance authorization.  Follow up Recommendations Outpatient SLP      Assistance Recommended at Discharge Frequent or constant Supervision/Assistance  Functional Status Assessment Patient has had a recent decline in their functional status  and demonstrates the ability to make significant improvements in function in a reasonable and predictable amount of time.  Frequency and Duration min 1 x/week  1 week       Prognosis Prognosis for Safe Diet Advancement: Good      Swallow Study   General Date of Onset: 06/25/21 HPI: 76 yo male adm to [facility] with speech deficits starting at 1700 on 06/24/2021.  Pt has h/o mechanical aortic valve replacement, back surgery in January 2023, HTN, gout, DM,  obesity, old CVA.  Pt for swallow/speech evaluation. Type of Study: Bedside Swallow Evaluation Diet Prior to this Study: NPO Temperature Spikes Noted: No Respiratory Status: Room air History of Recent Intubation: No Behavior/Cognition: Alert;Cooperative Oral Cavity Assessment: Within Functional Limits;Dry Oral Care Completed by SLP: Yes Oral Cavity - Dentition: Adequate natural dentition Vision: Functional for self-feeding Self-Feeding Abilities: Able to feed self Patient Positioning: Upright in bed Baseline Vocal Quality: Normal Volitional Cough: Weak Volitional Swallow: Able to elicit    Oral/Motor/Sensory Function Overall Oral Motor/Sensory Function: Within functional limits   Ice Chips Ice chips: Not tested   Thin Liquid Thin Liquid: Within functional limits Presentation: Cup;Self Fed;Straw    Nectar Thick Nectar Thick Liquid: Not tested   Honey Thick Honey Thick Liquid: Not tested   Puree Puree: Within functional limits Presentation: Self Fed;Spoon   Solid     Solid: Within functional limits      Macario Golds 06/25/2021,1:08 PM   Kathleen Lime, MS [facility] SLP [place] Office [phone] Pager [phone]

## 2021-06-25 NOTE — Progress Notes (Signed)
Notified [facility] service of patient's arrival to the unit at [place]. Also notified Dr. Curly Shores with Neurology of patient's arrival.

## 2021-06-25 NOTE — Evaluation (Signed)
Physical Therapy Evaluation and Discharge Patient Details Name: [name] MRN: [id number] DOB: 1945-02-05 Today's Date: 06/25/2021  History of Present Illness  76 y.o. male presented to ED 06/24/21 with acute onset expressive aphasia. MRIbrain Small to moderate left MCA branch infarct affecting left insular  and frontal cortex; chronic cerebellar infarcts.  PMH significant of hypertension, obesity (BMI 32.33), OSA on CPAP, insulin-dependent type 2 diabetes, aortic stenosis status post mechanical aortic valve replacement on Coumadin, CAD, hypertension, hyperlipidemia, hypertrophic cardiomyopathy status post myectomy, history of skin and prostate cancer, CVA  Clinical Impression   Patient evaluated by Physical Therapy with no further acute PT needs identified. Patient with no difficulty with gait and scored 54/56 on Berg Balance Assessment--above the cut-off score indicative of falls (45).  PT is signing off. Thank you for this referral.        Recommendations for follow up therapy are one component of a multi-disciplinary discharge planning process, led by the attending physician.  Recommendations may be updated based on patient status, additional functional criteria and insurance authorization.  Follow Up Recommendations No PT follow up    Assistance Recommended at Discharge None  Patient can return home with the following       Equipment Recommendations None recommended by PT  Recommendations for Other Services       Functional Status Assessment Patient has not had a recent decline in their functional status     Precautions / Restrictions Precautions Precautions: None      Mobility  Bed Mobility Overal bed mobility: Independent                  Transfers Overall transfer level: Independent                      Ambulation/Gait Ambulation/Gait assistance: Independent Gait Distance (Feet): 200 Feet Assistive device: None Gait Pattern/deviations:  WFL(Within Functional Limits)   Gait velocity interpretation: 1.31 - 2.62 ft/sec, indicative of limited community ambulator   General Gait Details: difficult to complete DGI due to aphasia  Stairs            Wheelchair Mobility    Modified Rankin (Stroke Patients Only)       Balance                                 Standardized Balance Assessment Standardized Balance Assessment : Oceanographer Test Berg Balance Test Sit to Stand: Able to stand without using hands and stabilize independently Standing Unsupported: Able to stand safely 2 minutes Sitting with Back Unsupported but Feet Supported on Floor or Stool: Able to sit safely and securely 2 minutes Stand to Sit: Sits safely with minimal use of hands Transfers: Able to transfer safely, minor use of hands Standing Unsupported with Eyes Closed: Able to stand 10 seconds safely Standing Ubsupported with Feet Together: Able to place feet together independently and stand 1 minute safely From Standing, Reach Forward with Outstretched Arm: Can reach confidently >25 cm (10") From Standing Position, Pick up Object from Floor: Able to pick up shoe safely and easily From Standing Position, Turn to Look Behind Over each Shoulder: Looks behind one side only/other side shows less weight shift Turn 360 Degrees: Able to turn 360 degrees safely in 4 seconds or less Standing Unsupported, Alternately Place Feet on Step/Stool: Able to stand independently and safely and complete 8 steps in 20 seconds Standing Unsupported, One Foot  in [place]: Able to place foot tandem independently and hold 30 seconds Standing on One Leg: Able to lift leg independently and hold 5-10 seconds Total Score: 54         Pertinent Vitals/Pain Pain Assessment Pain Assessment: No/denies pain    Home Living Family/patient expects to be discharged to:: Private residence Living Arrangements: Spouse/significant other Available Help at Discharge:  Family;Available 24 hours/day Type of Home: House Home Access: Stairs to enter Entrance Stairs-Rails: None Entrance Stairs-Number of Steps: 1   Home Layout: One level Home Equipment: Rollator (4 wheels);Shower seat;Grab bars - toilet;Grab bars - tub/shower Additional Comments: information from 01/2021 chart; pt unable to state due to expressive aphasia    Prior Function Prior Level of Function : Independent/Modified Independent;Driving             Mobility Comments: from prior medical record       Hand Dominance   Dominant Hand: Right    Extremity/Trunk Assessment   Upper Extremity Assessment Upper Extremity Assessment: Defer to OT evaluation    Lower Extremity Assessment Lower Extremity Assessment: Overall WFL for tasks assessed    Cervical / Trunk Assessment Cervical / Trunk Assessment: Normal  Communication   Communication: Expressive difficulties  Cognition Arousal/Alertness: Awake/alert Behavior During Therapy: WFL for tasks assessed/performed Overall Cognitive Status: Difficult to assess                                          General Comments General comments (skin integrity, edema, etc.): Increased time due to pt's attempts to communicate.    Exercises     Assessment/Plan    PT Assessment Patient does not need any further PT services  PT Problem List         PT Treatment Interventions      PT Goals (Current goals can be found in the Care Plan section)  Acute Rehab PT Goals PT Goal Formulation: All assessment and education complete, DC therapy    Frequency       Co-evaluation               AM-PAC PT "6 Clicks" Mobility  Outcome Measure Help needed turning from your back to your side while in a flat bed without using bedrails?: None Help needed moving from lying on your back to sitting on the side of a flat bed without using bedrails?: None Help needed moving to and from a bed to a chair (including a wheelchair)?:  None Help needed standing up from a chair using your arms (e.g., wheelchair or bedside chair)?: None Help needed to walk in hospital room?: None Help needed climbing 3-5 steps with a railing? : None 6 Click Score: 24    End of Session Equipment Utilized During Treatment: Gait belt Activity Tolerance: Patient tolerated treatment well Patient left: in bed;with call bell/phone within reach Nurse Communication: Mobility status PT Visit Diagnosis: Other symptoms and signs involving the nervous system (R29.898)    Time: 1251-1310 PT Time Calculation (min) (ACUTE ONLY): 19 min   Charges:   PT Evaluation $PT Eval Low Complexity: [place], PT Acute Rehabilitation Services  Pager [phone] Office [phone]   Rexanne Mano 06/25/2021, 1:21 PM

## 2021-06-25 NOTE — Plan of Care (Signed)
  Problem: Education: Goal: Knowledge of General Education information will improve Description Including pain rating scale, medication(s)/side effects and non-pharmacologic comfort measures Outcome: Progressing   

## 2021-06-25 NOTE — Progress Notes (Signed)
ANTICOAGULATION CONSULT NOTE - Initial Consult  Pharmacy Consult for Warfarin Indication: Mechanical Valve  No Known Allergies  Patient Measurements: Height: '5\' 10"'$  (177.8 cm) Weight: 102.2 kg (225 lb 5 oz) IBW/kg (Calculated) : 73  Vital Signs: Temp: 98 F (36.7 C) (05/27 1125) Temp Source: Oral (05/27 1125) BP: 178/76 (05/27 1125) Pulse Rate: 65 (05/27 1125)  Labs: Recent Labs    06/24/21 1940 06/25/21 0358  HGB 15.2  --   HCT 44.3  --   PLT 235  --   APTT 33  --   LABPROT 28.1*  --   INR 2.7*  --   CREATININE 1.50* 1.35*  TROPONINIHS  --  25*    Estimated Creatinine Clearance: 55.8 mL/min (A) (by C-G formula based on SCr of 1.35 mg/dL (H)).   Medical History: Past Medical History:  Diagnosis Date   Allergy    Aortic stenosis    Arthritis    CAD (coronary artery disease)    cabg   Cancer ([place]) 01/30/2009   prostate, skin   Chronic kidney disease    Complication of anesthesia    Erectile dysfunction    Heart murmur    Hemochromatosis 02/14/2011   Hemorrhoids    History of gout    History of hepatitis 01/31/1972   Hyperlipidemia    Hypertension    Hypertr obst cardiomyop    Mechanical heart valve present    Manufacturer: [employer] #: [id number]  Model #: [id number]. Card states MRI compatible with 3 teslas or less.   Obesity    moderate   PONV (postoperative nausea and vomiting)    after CABG- slow to wake up   Sensorineural hearing loss    Sleep apnea    Stroke [facility])    Old stroke - patient was not aware.   Type II or unspecified type diabetes mellitus without mention of complication, not stated as uncontrolled     Assessment: [name] is a 76 year old male presenting with aphasia. He is on warfarin PTA for mechanical heart valve. INR on 2.7 on 5/26 and CBC stable. Will resume home warfarin regimen.   PTA warfarin regimen:  7.'5mg'$  on Sun, Tues, Thurs, Sat 3.75 mg (1/2 tab) Mon, Wed, Fri   Goal of Therapy:  Goal INR: 2.5-3.5 Monitor  platelets by anticoagulation protocol: Yes   Plan:  Start Warfarin 7.5 mg x1 dose at 1600 Daily INR and CBC Monitor for signs and symptoms of bleeding  [name], PharmD PGY2 Infectious Diseases Pharmacy Resident   Please check [email or web address] for unit-specific pharmacy phone numbers

## 2021-06-25 NOTE — Evaluation (Signed)
Occupational Therapy Evaluation Patient Details Name: [name] MRN: [id number] DOB: 11/29/1945 Today's Date: 06/25/2021   History of Present Illness 76 y.o. male presented to ED 06/24/21 with acute onset expressive aphasia. MRIbrain Small to moderate left MCA branch infarct affecting left insular  and frontal cortex; chronic cerebellar infarcts.  PMH significant of hypertension, obesity (BMI 32.33), OSA on CPAP, insulin-dependent type 2 diabetes, aortic stenosis status post mechanical aortic valve replacement on Coumadin, CAD, hypertension, hyperlipidemia, hypertrophic cardiomyopathy status post myectomy, history of skin and prostate cancer, CVA   Clinical Impression   Pt admitted for concerns listed above. PTA pt reported that he was independent with all ADL's and IADL's. At this time pt presents with communication difficulties, some cognitive concerns, R visual field deficits, and mild weakness/balance concerns. He is overall at a supervision to min guard level for safety, using no AD. Recommending OP OT in a neuro based clinic to further assess and treat his visual deficits and cognitive deficits. Pt has no further acute OT needs and OT will sign off.       Recommendations for follow up therapy are one component of a multi-disciplinary discharge planning process, led by the attending physician.  Recommendations may be updated based on patient status, additional functional criteria and insurance authorization.   Follow Up Recommendations  Outpatient OT (Neuro Rehab)    Assistance Recommended at Discharge Set up Supervision/Assistance  Patient can return home with the following A little help with bathing/dressing/bathroom;Assistance with cooking/housework;Direct supervision/assist for medications management    Functional Status Assessment  Patient has had a recent decline in their functional status and demonstrates the ability to make significant improvements in function in a reasonable  and predictable amount of time.  Equipment Recommendations  None recommended by OT    Recommendations for Other Services       Precautions / Restrictions Precautions Precautions: None Restrictions Weight Bearing Restrictions: No      Mobility Bed Mobility Overal bed mobility: Independent                  Transfers Overall transfer level: Modified independent Equipment used: None               General transfer comment: increased time to steady      Balance Overall balance assessment: Mild deficits observed, not formally tested                                         ADL either performed or assessed with clinical judgement   ADL Overall ADL's : Needs assistance/impaired Eating/Feeding: Independent;Sitting   Grooming: Supervision/safety;Standing   Upper Body Bathing: Supervision/ safety;Sitting   Lower Body Bathing: Min guard;Sitting/lateral leans;Sit to/from stand   Upper Body Dressing : Independent;Sitting   Lower Body Dressing: Min guard;Sitting/lateral leans;Sit to/from stand   Toilet Transfer: Supervision/safety;Min guard;Ambulation   Toileting- Clothing Manipulation and Hygiene: Supervision/safety;Sitting/lateral lean       Functional mobility during ADLs: Supervision/safety;Min guard General ADL Comments: Pt limited by awareness, visual deficits, and mild weakness     Vision Baseline Vision/History: 1 Wears glasses Ability to See in Adequate Light: 0 Adequate Patient Visual Report: No change from baseline Vision Assessment?: Yes;Vision impaired- to be further tested in functional context Eye Alignment: Within Functional Limits Ocular Range of Motion: Within Functional Limits Alignment/Gaze Preference: Within Defined Limits Tracking/Visual Pursuits: Decreased smoothness of horizontal tracking;Requires cues, head turns,  or add eye shifts to track Saccades: Decreased speed of saccadic movement Convergence: Within  functional limits Visual Fields: Right visual field deficit     Perception     Praxis      Pertinent Vitals/Pain Pain Assessment Pain Assessment: No/denies pain     Hand Dominance Right   Extremity/Trunk Assessment Upper Extremity Assessment Upper Extremity Assessment: Overall WFL for tasks assessed   Lower Extremity Assessment Lower Extremity Assessment: Defer to PT evaluation   Cervical / Trunk Assessment Cervical / Trunk Assessment: Normal   Communication Communication Communication: Expressive difficulties   Cognition Arousal/Alertness: Awake/alert Behavior During Therapy: WFL for tasks assessed/performed Overall Cognitive Status: Difficult to assess                                 General Comments: impaired communacation, slow to respond, poor attention, requiring verbal cuing throuhgout.     General Comments  VSS on RA    Exercises     Shoulder Instructions      Home Living Family/patient expects to be discharged to:: Private residence Living Arrangements: Spouse/significant other Available Help at Discharge: Family;Available 24 hours/day Type of Home: House Home Access: Stairs to enter [employer] of Steps: 1 Entrance Stairs-Rails: None Home Layout: One level     Bathroom Shower/Tub: Occupational psychologist: Standard     Home Equipment: Rollator (4 wheels);Shower seat;Grab bars - toilet;Grab bars - tub/shower   Additional Comments: information from wife  Lives With: Spouse    Prior Functioning/Environment Prior Level of Function : Independent/Modified Independent;Driving             Mobility Comments: from prior medical record          OT Problem List: Decreased strength;Impaired balance (sitting and/or standing);Impaired vision/perception;Decreased cognition;Decreased safety awareness      OT Treatment/Interventions:      OT Goals(Current goals can be found in the care plan section) Acute Rehab  OT Goals Patient Stated Goal: To be able to talk again OT Goal Formulation: With patient/family Time For Goal Achievement: 06/25/21 Potential to Achieve Goals: Good  OT Frequency:      Co-evaluation              AM-PAC OT "6 Clicks" Daily Activity     Outcome Measure Help from another person eating meals?: None Help from another person taking care of personal grooming?: A Little Help from another person toileting, which includes using toliet, bedpan, or urinal?: A Little Help from another person bathing (including washing, rinsing, drying)?: A Little Help from another person to put on and taking off regular upper body clothing?: None Help from another person to put on and taking off regular lower body clothing?: A Little 6 Click Score: 20   End of Session Equipment Utilized During Treatment: Gait belt Nurse Communication: Mobility status  Activity Tolerance: Patient tolerated treatment well Patient left: in bed;with call bell/phone within reach;with family/visitor present  OT Visit Diagnosis: Unsteadiness on feet (R26.81);Muscle weakness (generalized) (M62.81);Other abnormalities of gait and mobility (R26.89)                Time: 1341-1404 OT Time Calculation (min): 23 min Charges:  OT General Charges $OT Visit: 1 Visit OT Evaluation $OT Eval Moderate Complexity: 1 Mod OT Treatments $Self Care/Home Management : 8-22 mins  Ridgely Anastacio H., OTR/L Acute Rehabilitation  [place] Savarese Elane Finley Dinkel 06/25/2021, 4:02 PM

## 2021-06-25 NOTE — Consult Note (Signed)
Neurology Consultation Reason for Consult: Aphasia Requesting Physician: Shela Leff  CC: aphasia  History is obtained from: Primarily from chart review given patient's significant aphasia  HPI: [name] is a 76 y.o. male with a past medical history significant for hypertension, hyperlipidemia, diabetes, obesity (BMI 32.33), sleep apnea, mechanical heart valve on anticoagulation with warfarin, coronary artery disease s/p CABG, lumbar spine decompression (January 2023), hearing loss  Per review of the notes his wife saw him at 5 PM and he was able to communicate normally, but when she next went to talk to him at 7 PM he was unable to get his words out.  He was evaluated by [facility] neurology for code stroke and found not to be candidate for intervention as documented below.  He was transferred to [facility] for stroke work-up  He was seen by outpatient neurology on 12/28/2020 for memory impairments.  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 Able to look after own affairs without assistance, but unable to carry out all previous activities.   ROS: Unable to obtain due to altered mental status.   Past Medical History:  Diagnosis Date   Allergy    Aortic stenosis    Arthritis    CAD (coronary artery disease)    cabg   Cancer ([place]) 01/30/2009   prostate, skin   Chronic kidney disease    Complication of anesthesia    Erectile dysfunction    Heart murmur    Hemochromatosis 02/14/2011   Hemorrhoids    History of gout    History of hepatitis 01/31/1972   Hyperlipidemia    Hypertension    Hypertr obst cardiomyop    Mechanical heart valve present    Manufacturer: Sherlean Foot #:  [id number]  Model #: [id number]. Card states MRI compatible with 3 teslas or less.   Obesity    moderate   PONV (postoperative nausea and vomiting)    after CABG- slow to wake up   Sensorineural hearing loss    Sleep apnea    Stroke [facility])    Old stroke - patient was not aware.   Type II or unspecified type diabetes mellitus without mention of complication, not stated as uncontrolled    Past Surgical History:  Procedure Laterality Date   AORTIC VALVE REPLACEMENT  11/14/2003   [facility]   APPENDECTOMY  1990   CHOLECYSTECTOMY  1990   CORONARY ARTERY BYPASS GRAFT  10/2003   HERNIA REPAIR  1999   right, inguinal   HERNIA REPAIR  2002   left, inguinal   HIP SURGERY  2006   right hip   LUMBAR LAMINECTOMY/DECOMPRESSION MICRODISCECTOMY Bilateral 02/04/2021   Procedure: Bilateral Lumbar Two-Three Laminectomy;  Surgeon: Kristeen Miss, MD;  Location: [place];  Service: Neurosurgery;  Laterality: Bilateral;  3C/RM 61   PILONIDAL CYST EXCISION  1964   prostate seed implant  3/12   TONSILLECTOMY  childhood     Family History  Problem Relation Age of Onset   Heart disease Mother    Stroke Mother    Heart disease Father    Asperger's syndrome Son    Hyperlipidemia Son  Coronary artery disease Brother    Cancer Neg Hx        negative for colon cancer   Current Outpatient Medications  Medication Instructions   AMBULATORY NON FORMULARY MEDICATION cpap cushions<BR><BR>          AirFit F20 (Size: Large)<BR>          AirFit F30 (Size: Med)<BR> <BR>Please FAX the prescription to:  [phone]   Basaglar KwikPen 24 Units, Subcutaneous, Daily   betamethasone dipropionate (DIPROLENE) 0.05 % cream Topical, 2 times daily PRN, To eczema rash   Blood Glucose Monitoring Suppl Supplies MISC Use for monitoring glucose level   colchicine 0.6 MG tablet TAKE 2 TABS BY MOUTH NOW AND THEN 1 TAB IN 1 HOUR FOR GOUT. (MAX 3 TABS/24 HRS)   Continuous Blood Gluc Sensor (DEXCOM G6 SENSOR) MISC 1 Device, Does  not apply, As directed   Continuous Blood Gluc Transmit (DEXCOM G6 TRANSMITTER) MISC 1 Device, Does not apply, As directed   dapagliflozin propanediol (FARXIGA) 10 mg, Oral, Daily before breakfast   fenofibrate (TRICOR) 145 MG tablet TAKE 1 TABLET BY MOUTH EVERY DAY   furosemide (LASIX) 20 MG tablet TAKE 1 TABLET BY MOUTH EVERY DAY AS NEEDED FOR SWELLING   glucose blood (ONETOUCH VERIO) test strip Use as instructed   icosapent Ethyl (VASCEPA) 2 g, Oral, 2 times daily   insulin aspart (NOVOLOG FLEXPEN) 100 UNIT/ML FlexPen Max daily 70 units   Insulin Pen Needle 32G X 4 MM MISC 1 Device, Does not apply, 4 times daily   metoprolol succinate (TOPROL-XL) 25 MG 24 hr tablet TAKE 1 TABLET BY MOUTH EVERY DAY   OneTouch Delica Lancets 47S MISC USE AS DIRECTED   rosuvastatin (CRESTOR) 40 mg, Oral, Daily   tacrolimus (PROTOPIC) 0.1 % ointment 1 application., Topical, Daily PRN   triamcinolone ointment (KENALOG) 0.1 % 1 application., Topical, 2 times daily   warfarin (COUMADIN) 7.5 MG tablet Take up to 1 tablet by mouth daily as directed by coumadin clinic     Social History:  reports that he has been smoking cigars. He has never used smokeless tobacco. He reports that he does not currently use alcohol. He reports that he does not use drugs.   Exam: Current vital signs: BP (!) 152/65 (BP Location: Left Arm)   Pulse 65   Temp 98.2 F (36.8 C) (Oral)   Resp 15   Ht '5\' 10"'$  (1.778 m)   Wt 102.2 kg   SpO2 97%   BMI 32.33 kg/m  Vital signs in last 24 hours: Temp:  [98.2 F (36.8 C)-98.4 F (36.9 C)] 98.2 F (36.8 C) (05/27 0355) Pulse Rate:  [56-77] 65 (05/27 0355) Resp:  [11-20] 15 (05/27 0355) BP: (152-198)/(65-106) 152/65 (05/27 0355) SpO2:  [96 %-98 %] 97 % (05/27 0355) Weight:  [97.5 kg-102.2 kg] 102.2 kg (05/26 2005)   Physical Exam  Constitutional: Appears well-developed and well-nourished.  Psych: Affect appropriate to situation, occasionally frustrated by his inability to  communicate but otherwise pleasant and cooperative Eyes: No scleral injection HENT: No oropharyngeal obstruction.  MSK: no joint deformities.  Cardiovascular: Perfusing extremities well Respiratory: Effort normal, non-labored breathing GI: Soft.  No distension. There is no tenderness.  Skin: Warm dry and intact visible skin  Neuro: Mental Status: Patient is awake, alert, and has a productive greater than receptive aphasia.  He mostly answers yes/no questions appropriately although occasionally gets some wrong.  He is able to utter some brief automatic phrases but has difficulty communicating any  meaningful information.  For example he repeatedly states "I understand that I and...." He additionally asks me to get the nurse, and then tells me he cannot hear.  No neglect Cranial Nerves: II: Visual Fields are full. Pupils are equal, round, and reactive to light.   III,IV, VI: EOMI to tracking examiner V: Facial sensation grossly equal to light touch VII: Facial movement is symmetric.  VIII: hearing is intact to voice X: Uvula incompletely visualized but symmetric XI: Shoulder shrug is symmetric. XII: tongue is midline without atrophy or fasciculations.  Motor: Tone is normal. Bulk is normal. 5/5 strength was present in all four extremities.  Sensory: Equally reactive to stimuli in all 4 extremities Cerebellar: FNF and HKS are intact bilaterally Gait:  Deferred   NIHSS total 4 Score breakdown: 2 points for not answering questions correctly, 2 points for severe aphasia Performed at 6:15 AM   I have reviewed labs in epic and the results pertinent to this consultation are:  Basic Metabolic Panel: Recent Labs  Lab 06/24/21 1940 06/25/21 0358  NA 137 137  K 3.5 3.4*  CL 103 103  CO2 25 24  GLUCOSE 182* 151*  BUN 30* 26*  CREATININE 1.50* 1.35*  CALCIUM 9.0 9.0    CBC: Recent Labs  Lab 06/24/21 1940  WBC 8.4  NEUTROABS 5.8  HGB 15.2  HCT 44.3  MCV 87.7  PLT 235     Coagulation Studies: Recent Labs    06/24/21 1940  LABPROT 28.1*  INR 2.7*      I have reviewed the images obtained:  Head CT 1. No acute intracranial process. 2. ASPECTS is 10  CTA:  1. No intracranial large vessel occlusion. Moderate to severe narrowing of the right cavernous carotid and moderate narrowing in the right supraclinoid carotid, left cavernous carotid, and distal left V4. 2.  No hemodynamically significant stenosis in the neck. 3. Diffuse osseous sclerosis, which is nonspecific but can be seen in the setting of renal osteodystrophy, Paget's disease, and diffuse metastatic disease, among other conditions.  Impression: This is a 76 year old gentleman with multiple vascular risk factors and some cognitive impairment presenting with new aphasia.  He has had a slow cognitive decline since at least late 2021, but from review of the chart this appears to be a rather dramatic change.  Differential includes stroke, less likely focal seizure  Recommendations: - MRI brain, stroke work-up if this is positive for stroke, I have asked MRI to prioritize this study given we are holding his anticoagulation and he has a mechanical valve; risk/benefit will need to be weighed carefully based on size of stroke - No neurological indication for echocardiogram as patient already has an indication for anticoagulation.  I have canceled this order, but if there is a cardiac or other indication per primary team it should be reordered - If MRI is negative, would get EEG - Neurology will follow  [place] [phone] Available 7 AM to 7 PM, outside these hours please contact Neurologist on call listed on AMION

## 2021-06-26 DIAGNOSIS — E1142 Type 2 diabetes mellitus with diabetic polyneuropathy: Secondary | ICD-10-CM | POA: Diagnosis not present

## 2021-06-26 DIAGNOSIS — Z7901 Long term (current) use of anticoagulants: Secondary | ICD-10-CM

## 2021-06-26 DIAGNOSIS — Z794 Long term (current) use of insulin: Secondary | ICD-10-CM | POA: Diagnosis not present

## 2021-06-26 DIAGNOSIS — R4701 Aphasia: Secondary | ICD-10-CM

## 2021-06-26 DIAGNOSIS — I639 Cerebral infarction, unspecified: Secondary | ICD-10-CM | POA: Diagnosis not present

## 2021-06-26 DIAGNOSIS — Z952 Presence of prosthetic heart valve: Secondary | ICD-10-CM | POA: Diagnosis not present

## 2021-06-26 LAB — BASIC METABOLIC PANEL
Anion gap: 9 (ref 5–15)
BUN: 25 mg/dL — ABNORMAL HIGH (ref 8–23)
CO2: 23 mmol/L (ref 22–32)
Calcium: 9.2 mg/dL (ref 8.9–10.3)
Chloride: 106 mmol/L (ref 98–111)
Creatinine, Ser: 1.51 mg/dL — ABNORMAL HIGH (ref 0.61–1.24)
GFR, Estimated: 48 mL/min — ABNORMAL LOW (ref >60)
Glucose, Bld: 170 mg/dL — ABNORMAL HIGH (ref 70–99)
Potassium: 3.9 mmol/L (ref 3.5–5.1)
Sodium: 138 mmol/L (ref 135–145)

## 2021-06-26 LAB — CBC
HCT: 43.7 % (ref 39.0–52.0)
Hemoglobin: 15 g/dL (ref 13.0–17.0)
MCH: 30.4 pg (ref 26.0–34.0)
MCHC: 34.3 g/dL (ref 30.0–36.0)
MCV: 88.6 fL (ref 80.0–100.0)
Platelets: 227 10*3/uL (ref 150–400)
RBC: 4.93 MIL/uL (ref 4.22–5.81)
RDW: 13.5 % (ref 11.5–15.5)
WBC: 7.7 10*3/uL (ref 4.0–10.5)
nRBC: 0 % (ref 0.0–0.2)

## 2021-06-26 LAB — PROTIME-INR
INR: 1.9 — ABNORMAL HIGH (ref 0.8–1.2)
Prothrombin Time: 21.5 seconds — ABNORMAL HIGH (ref 11.4–15.2)

## 2021-06-26 LAB — GLUCOSE, CAPILLARY
Glucose-Capillary: 200 mg/dL — ABNORMAL HIGH (ref 70–99)
Glucose-Capillary: 171 mg/dL — ABNORMAL HIGH (ref 70–99)

## 2021-06-26 MED ORDER — ENOXAPARIN SODIUM 100 MG/ML IJ SOSY: 100.0000 mg | PREFILLED_SYRINGE | Freq: Two times a day (BID) | INTRAMUSCULAR | 0 refills | Status: DC

## 2021-06-26 MED ORDER — ENOXAPARIN SODIUM 100 MG/ML IJ SOSY
100.0000 mg | PREFILLED_SYRINGE | Freq: Two times a day (BID) | INTRAMUSCULAR | Status: DC
  Administered 2021-06-26: 100 mg via SUBCUTANEOUS
  Filled 2021-06-26 (×2): qty 1

## 2021-06-26 MED ORDER — WARFARIN SODIUM 7.5 MG PO TABS: 7.5000 mg | ORAL_TABLET | Freq: Once | ORAL | Status: DC

## 2021-06-26 NOTE — Discharge Summary (Signed)
PATIENT DETAILS Name: [name] Age: 76 y.o. Sex: male Date of Birth: [birth year] MRN: [id number]. Admitting Physician: Shela Leff, MD PCP:O'Sullivan, Lenna Sciara, NP  Admit Date: 06/24/2021 Discharge date: 06/26/2021  Recommendations for Outpatient Follow-up:  Follow up with PCP in 1-2 weeks Please obtain CMP/CBC in one week Please ensure follow-up at Coumadin clinic this Tuesday-goal INR 2.5-3.5 since he has had a stroke.  Stop Lovenox once at goal INR.  Admitted From:  Home  Disposition: Home health   Discharge Condition: good  CODE STATUS:   Code Status: Full Code   Diet recommendation:  Diet Order             Diet - low sodium heart healthy           Diet Carb Modified           Diet heart healthy/carb modified Room service appropriate? Yes; Fluid consistency: Thin  Diet effective now                    Brief Summary: Patient is a 76 y.o.  male with history of mechanical aortic valve on anticoagulation with Coumadin-presenting with expressive aphasia.   Significant events: 5/26>> admit to [facility]-presenting with expressive aphasia.  INR 2.7   Significant studies: 5/26>> CT head: No acute intracranial process. 5/26>> CTA head: No intracranial large vessel occlusion, moderate to severe narrowing of right cavernous carotid, moderate narrowing in the right supraclinoid carotid, left cavernous carotid and distal left V4. 5/26>> CTA neck: No hemodynamically significant stenosis in the neck 5/27>> LDL: 65 5/28>> A1c 8.3. 5/28>> EF> 75%, echo findings consistent with normal structure and function of the aortic valve prosthesis.   Significant microbiology data: 5/26>> COVID/influenza PCR: Negative   Procedures:     Consults: None   Brief Hospital Course: Expressive aphasia-due to acute embolic CVA-history of mechanical aortic valve replacement on Coumadin: Unfortunately appears to have a embolic CVA in spite of of being within therapeutic range.   Stroke work-up as above-discussed with stroke MD-Dr Xu-recommendations are to continue with Coumadin-new target INR 2.5-3.5.  Unfortunately-INR 1.9 this morning-Per neurology-okay to discharge with overlapping Lovenox until INR therapeutic.  Spoke with spouse-patient has Lovenox injections at home (only 2 days supply-I will send further refills to his pharmacy)-they are very well versed with Lovenox injections-she will call the Coumadin clinic on Tuesday for a appointment.  Stable for discharge with home health services.    History of mechanical aortic valve replacement [facility] Jude aortic valve)-2005: See above-echo with stable EF and appropriately functioning aortic valve prosthesis.     History of CAD s/p CABG 2005: No anginal symptoms   HTN: Now some amount of permissive hypertension for now-we will gradually lower.  DM-2: Continue outpatient regimen-we will need better control of his diabetic regimen-now that he has had a CVA.   Hypokalemia: Repleted.   AKI: Appears mild-repeat electrolytes in 1 week.  Suspect hemodynamically mediated.   OSA: CPAP nightly   Diffuse osseous sclerosis seen on neuroimaging: Discussed with spouse/patient-further work-up deferred to the outpatient setting.  Per spouse-when patient had back surgery several years ago-he was noted to have "bone issues".   BMI: Estimated body mass index is 32.33 kg/m as calculated from the following:   Height as of this encounter: '5\' 10"'$  (1.778 m).   Weight as of this encounter: 102.2 kg.     Discharge Diagnoses:  Principal Problem:   Acute CVA (cerebrovascular accident) [facility]) Active Problems:   Expressive aphasia  Discharge Instructions:  Activity:  As tolerated with Full fall precautions use walker/cane & assistance as needed   Discharge Instructions     Ambulatory referral to Neurology   Complete by: As directed    An appointment is requested in approximately: 8 weeks   Diet - low sodium heart healthy    Complete by: As directed    Diet Carb Modified   Complete by: As directed    Discharge instructions   Complete by: As directed    Follow with Primary MD  [name], NP in 1-2 weeks  Please follow-up with your cardiologist in the next 1-2 weeks.  Please call Coumadin clinic either on Monday or Tuesday to get a appointment for an INR check.  Continue Lovenox with overlapping Coumadin until your INR is therapeutic (between 2.5-3.5)  Please get a complete blood count and chemistry panel checked by your Primary MD at your next visit, and again as instructed by your Primary MD.  Get Medicines reviewed and adjusted: Please take all your medications with you for your next visit with your Primary MD  Laboratory/radiological data: Please request your Primary MD to go over all hospital tests and procedure/radiological results at the follow up, please ask your Primary MD to get all Hospital records sent to his/her office.  In some cases, they will be blood work, cultures and biopsy results pending at the time of your discharge. Please request that your primary care M.D. follows up on these results.  Also Note the following: If you experience worsening of your admission symptoms, develop shortness of breath, life threatening emergency, suicidal or homicidal thoughts you must seek medical attention immediately by calling 911 or calling your MD immediately  if symptoms less severe.  You must read complete instructions/literature along with all the possible adverse reactions/side effects for all the Medicines you take and that have been prescribed to you. Take any new Medicines after you have completely understood and accpet all the possible adverse reactions/side effects.   Do not drive when taking Pain medications or sleeping medications (Benzodaizepines)  Do not take more than prescribed Pain, Sleep and Anxiety Medications. It is not advisable to combine anxiety,sleep and pain medications  without talking with your primary care practitioner  Special Instructions: If you have smoked or chewed Tobacco  in the last 2 yrs please stop smoking, stop any regular Alcohol  and or any Recreational drug use.  Wear Seat belts while driving.  Please note: You were cared for by a hospitalist during your hospital stay. Once you are discharged, your primary care physician will handle any further medical issues. Please note that NO REFILLS for any discharge medications will be authorized once you are discharged, as it is imperative that you return to your primary care physician (or establish a relationship with a primary care physician if you do not have one) for your post hospital discharge needs so that they can reassess your need for medications and monitor your lab values.   Increase activity slowly   Complete by: As directed       Allergies as of 06/26/2021   No Known Allergies      Medication List     TAKE these medications    AMBULATORY NON FORMULARY MEDICATION cpap cushions            AirFit F20 (Size: Large)           AirFit F30 (Size: Med)   Please FAX the prescription to:  [phone]  Basaglar KwikPen 100 UNIT/ML Inject 24 Units into the skin daily.   betamethasone dipropionate 0.05 % cream Apply topically 2 (two) times daily as needed. To eczema rash   Blood Glucose Monitoring Suppl Supplies Misc Use for monitoring glucose level   colchicine 0.6 MG tablet TAKE 2 TABS BY MOUTH NOW AND THEN 1 TAB IN 1 HOUR FOR GOUT. (MAX 3 TABS/24 HRS) What changed:  how much to take how to take this when to take this reasons to take this additional instructions   dapagliflozin propanediol 10 MG Tabs tablet Commonly known as: Farxiga Take 1 tablet (10 mg total) by mouth daily before breakfast.   Dexcom G6 Sensor Misc 1 Device by Does not apply route as directed.   Dexcom G6 Transmitter Misc 1 Device by Does not apply route as directed.   enoxaparin 100 MG/ML  injection Commonly known as: LOVENOX Inject 1 mL (100 mg total) into the skin every 12 (twelve) hours for 3 days.   fenofibrate 145 MG tablet Commonly known as: TRICOR TAKE 1 TABLET BY MOUTH EVERY DAY   furosemide 20 MG tablet Commonly known as: LASIX TAKE 1 TABLET BY MOUTH EVERY DAY AS NEEDED FOR SWELLING   Insulin Pen Needle 32G X 4 MM Misc 1 Device by Does not apply route in the morning, at noon, in the evening, and at bedtime.   metoprolol succinate 25 MG 24 hr tablet Commonly known as: TOPROL-XL TAKE 1 TABLET BY MOUTH EVERY DAY   NovoLOG FlexPen 100 UNIT/ML FlexPen Generic drug: insulin aspart Max daily 70 units What changed: additional instructions   OneTouch Delica Lancets 62B Misc USE AS DIRECTED   OneTouch Verio test strip Generic drug: glucose blood Use as instructed   rosuvastatin 40 MG tablet Commonly known as: CRESTOR Take 1 tablet (40 mg total) by mouth daily. What changed: when to take this   tacrolimus 0.1 % ointment Commonly known as: PROTOPIC Apply 1 application topically daily as needed (irritation).   warfarin 7.5 MG tablet Commonly known as: COUMADIN Take as directed. If you are unsure how to take this medication, talk to your nurse or doctor. Original instructions: Take up to 1 tablet by mouth daily as directed by coumadin clinic What changed:  how much to take how to take this when to take this additional instructions        Follow-up Information     [name], NP. Schedule an appointment as soon as possible for a visit in 1 week(s).   Specialty: Internal Medicine Contact information: [address] [place] [address] [phone]         Lelon Perla, MD Follow up in 2 week(s).   Specialty: Cardiology Contact information: [address] [place] [address] [phone]                No Known Allergies   Other Procedures/Studies: CT Angio Head W or Wo  Contrast  Result Date: 06/24/2021 CLINICAL DATA:  Aphasia, facial droop, stroke suspected EXAM: CT ANGIOGRAPHY HEAD AND NECK TECHNIQUE: Multidetector CT imaging of the head and neck was performed using the standard protocol during bolus administration of intravenous contrast. Multiplanar CT image reconstructions and MIPs were obtained to evaluate the vascular anatomy. Carotid stenosis measurements (when applicable) are obtained utilizing NASCET criteria, using the distal internal carotid diameter as the denominator. RADIATION DOSE REDUCTION: This exam was performed according to the departmental dose-optimization program which includes automated exposure control, adjustment of the  mA and/or kV according to patient size and/or use of iterative reconstruction technique. CONTRAST:  135m OMNIPAQUE IOHEXOL 350 MG/ML SOLN COMPARISON:  No prior CTA, correlation is made with CT head 06/24/2021 FINDINGS: CT HEAD FINDINGS For noncontrast findings, please see same day CT head. CTA NECK FINDINGS Aortic arch: Standard branching. Imaged portion shows no evidence of aneurysm or dissection. No significant stenosis of the major arch vessel origins. Aortic atherosclerosis. Right carotid system: No evidence of dissection, occlusion, or hemodynamically significant stenosis (greater than 50%). Calcifications at the bifurcation, in the proximal right ICA, and in the distal right ICA are not hemodynamically significant. Left carotid system: No evidence of dissection, occlusion, or hemodynamically significant stenosis (greater than 50%). Calcifications at the bifurcation and in the left ICA are not hemodynamically significant. Vertebral arteries: No evidence of dissection, occlusion, or hemodynamically significant stenosis (greater than 50%). Skeleton: Diffusely sclerosis. Status post median sternotomy. Degenerative changes in the cervical spine. Other neck: No acute finding. Upper chest: No focal pulmonary opacity or pleural effusion.  Review of the MIP images confirms the above findings CTA HEAD FINDINGS Anterior circulation: Both internal carotid arteries are patent to the termini, with moderate to severe narrowing in the right cavernous carotid, moderate narrowing in the right supraclinoid carotid, and moderate narrowing in the left cavernous carotid. Left A1 segment is patent. 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 Normal anterior communicating artery. Anterior cerebral arteries are patent to their distal aspects. No M1 stenosis or occlusion, although the right M1 is somewhat irregular. Normal MCA bifurcations. Distal MCA branches perfused and symmetric. Posterior circulation: Vertebral arteries patent to the vertebrobasilar junction, with mild narrowing in the proximal left V4 and moderate stenosis in the distal left V4. Posterior inferior cerebellar arteries patent proximally. Basilar patent to its distal aspect but diminutive. Superior cerebellar arteries patent proximally. Possible hypoplastic P1 segments, with fetal or near fetal origin of the bilateral PCAs. PCAs perfused to their Venous sinuses: As permitted by contrast timing, patent. Anatomic variants: Fetal or near fetal origin of the bilateral PCAs. Review of the MIP images confirms the above findings IMPRESSION: 1. No intracranial large vessel occlusion. Moderate to severe narrowing of the right cavernous carotid and moderate narrowing in the right supraclinoid carotid, left cavernous carotid, and distal left V4. 2.  No hemodynamically significant stenosis in the neck. 3. Diffuse osseous sclerosis, which is nonspecific but can be seen in the setting of renal osteodystrophy, Paget's disease, and diffuse metastatic disease, among other conditions. Electronically Signed   By: AMerilyn BabaM.D.   On: 06/24/2021 20:34   CT Angio Neck W and/or Wo Contrast  Result Date: 06/24/2021 CLINICAL DATA:  Aphasia, facial droop, stroke suspected EXAM: CT ANGIOGRAPHY HEAD AND NECK TECHNIQUE: Multidetector  CT imaging of the head and neck was performed using the standard protocol during bolus administration of intravenous contrast. Multiplanar CT image reconstructions and MIPs were obtained to evaluate the vascular anatomy. Carotid stenosis measurements (when applicable) are obtained utilizing NASCET criteria, using the distal internal carotid diameter as the denominator. RADIATION DOSE REDUCTION: This exam was performed according to the departmental dose-optimization program which includes automated exposure control, adjustment of the mA and/or kV according to patient size and/or use of iterative reconstruction technique. CONTRAST:  1017mOMNIPAQUE IOHEXOL 350 MG/ML SOLN COMPARISON:  No prior CTA, correlation is made with CT head 06/24/2021 FINDINGS: CT HEAD FINDINGS For noncontrast findings, please see same day CT head. CTA NECK FINDINGS Aortic arch: Standard branching. Imaged portion shows no evidence of  aneurysm or dissection. No significant stenosis of the major arch vessel origins. Aortic atherosclerosis. Right carotid system: No evidence of dissection, occlusion, or hemodynamically significant stenosis (greater than 50%). Calcifications at the bifurcation, in the proximal right ICA, and in the distal right ICA are not hemodynamically significant. Left carotid system: No evidence of dissection, occlusion, or hemodynamically significant stenosis (greater than 50%). Calcifications at the bifurcation and in the left ICA are not hemodynamically significant. Vertebral arteries: No evidence of dissection, occlusion, or hemodynamically significant stenosis (greater than 50%). Skeleton: Diffusely sclerosis. Status post median sternotomy. Degenerative changes in the cervical spine. Other neck: No acute finding. Upper chest: No focal pulmonary opacity or pleural effusion. Review of the MIP images confirms the above findings CTA HEAD FINDINGS Anterior circulation: Both internal carotid arteries are patent to the termini,  with moderate to severe narrowing in the right cavernous carotid, moderate narrowing in the right supraclinoid carotid, and moderate narrowing in the left cavernous carotid. Left A1 segment is patent. 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 Normal anterior communicating artery. Anterior cerebral arteries are patent to their distal aspects. No M1 stenosis or occlusion, although the right M1 is somewhat irregular. Normal MCA bifurcations. Distal MCA branches perfused and symmetric. Posterior circulation: Vertebral arteries patent to the vertebrobasilar junction, with mild narrowing in the proximal left V4 and moderate stenosis in the distal left V4. Posterior inferior cerebellar arteries patent proximally. Basilar patent to its distal aspect but diminutive. Superior cerebellar arteries patent proximally. Possible hypoplastic P1 segments, with fetal or near fetal origin of the bilateral PCAs. PCAs perfused to their Venous sinuses: As permitted by contrast timing, patent. Anatomic variants: Fetal or near fetal origin of the bilateral PCAs. Review of the MIP images confirms the above findings IMPRESSION: 1. No intracranial large vessel occlusion. Moderate to severe narrowing of the right cavernous carotid and moderate narrowing in the right supraclinoid carotid, left cavernous carotid, and distal left V4. 2.  No hemodynamically significant stenosis in the neck. 3. Diffuse osseous sclerosis, which is nonspecific but can be seen in the setting of renal osteodystrophy, Paget's disease, and diffuse metastatic disease, among other conditions. Electronically Signed   By: Merilyn Baba M.D.   On: 06/24/2021 20:34   MR BRAIN WO CONTRAST  Result Date: 06/25/2021 CLINICAL DATA:  Neuro deficit with stroke suspected EXAM: MRI HEAD WITHOUT CONTRAST TECHNIQUE: Multiplanar, multiecho pulse sequences of the brain and surrounding structures were obtained without intravenous contrast. COMPARISON:  Head CT and CTA from yesterday FINDINGS: Brain:  Acute infarct affecting cortex at the upper left insula and adjacent frontal lobe. Mild extension into the subjacent white matter at the level of the frontal lobe. No infarct in the separate vascular distribution. Chronic small vessel ischemia in the cerebral white matter which is mild for age. Multiple small remote cerebellar infarcts. Cerebral volume loss which is mild for age. No hemorrhage, hydrocephalus, or masslike finding Vascular: Major flow voids are preserved Skull and upper cervical spine: Normal marrow signal Sinuses/Orbits: Small retention cysts along the floors of the maxillary sinuses. IMPRESSION: 1. Small to moderate left MCA branch infarct affecting left insular and frontal cortex. 2. Small chronic cerebellar infarcts. Mild chronic microvascular ischemia and cerebral volume loss for age. Electronically Signed   By: Jorje Guild M.D.   On: 06/25/2021 09:23   ECHOCARDIOGRAM COMPLETE  Result Date: 06/25/2021    ECHOCARDIOGRAM REPORT   Patient Name:   [name] Date of Exam: 06/25/2021 Medical Rec #:  [id number]  Height:       70.0 in Accession #:    [id number]       Weight:       225.3 lb Date of Birth:  [birth year]        BSA:          2.196 m Patient Age:    56 years         BP:           178/76 mmHg Patient Gender: M                HR:           62 bpm. Exam Location:  Inpatient Procedure: 2D Echo, Cardiac Doppler, Color Doppler and Intracardiac            Opacification Agent Indications:    Stroke  History:        Patient has prior history of Echocardiogram examinations, most                 recent 10/11/2020. CAD; Prior CABG. 62m St Jude aortic position                 and CABG 2005.                 Aortic Valve: 26 mm St. Jude mechanical valve is present in the                 aortic position. Procedure Date: 2005.  Sonographer:    RMerrie RoofRDCS Referring Phys: [id number] 1. No apical thrombus with Definity contrast. Left ventricular ejection fraction, by  estimation, is >75%. The left ventricle has hyperdynamic function. The left ventricle has no regional wall motion abnormalities. There is moderate left ventricular hypertrophy of the basal-septal segment. Left ventricular diastolic parameters are consistent with Grade I diastolic dysfunction (impaired relaxation). Elevated left ventricular end-diastolic pressure. The E/e' is 167  2. Right ventricular systolic function is normal. The right ventricular size is normal.  3. The mitral valve is grossly normal. Trivial mitral valve regurgitation.  4. The aortic valve was not well visualized. Aortic valve regurgitation is trivial. There is a 26 mm St. Jude mechanical valve present in the aortic position. Procedure Date: 2005. Echo findings are consistent with normal structure and function of the aortic valve prosthesis. Aortic valve mean gradient measures 11.0 mmHg.  5. Aortic dilatation noted. There is mild dilatation of the ascending aorta, measuring 42 mm. Comparison(s): Changes from prior study are noted. 10/11/2020: LVEF 60-65%, St. Jude aortic valve with 9.8 mmHg gradient, dilated aorta to 43 mm. FINDINGS  Left Ventricle: No apical thrombus with Definity contrast. Left ventricular ejection fraction, by estimation, is >75%. The left ventricle has hyperdynamic function. The left ventricle has no regional wall motion abnormalities. The left ventricular internal cavity size was normal in size. There is moderate left ventricular hypertrophy of the basal-septal segment. Left ventricular diastolic parameters are consistent with Grade I diastolic dysfunction (impaired relaxation). Elevated left ventricular end-diastolic pressure. The E/e' is 15. Right Ventricle: The right ventricular size is normal. No increase in right ventricular wall thickness. Right ventricular systolic function is normal. Left Atrium: Left atrial size was normal in size. Right Atrium: Right atrial size was normal in size. Pericardium: There is no  evidence of pericardial effusion. Mitral Valve: The mitral valve is grossly normal. Trivial mitral valve regurgitation. Tricuspid Valve: The tricuspid valve is grossly normal. Tricuspid valve regurgitation is trivial. Aortic Valve:  The aortic valve was not well visualized. Aortic valve regurgitation is trivial. Aortic valve mean gradient measures 11.0 mmHg. Aortic valve peak gradient measures 19.5 mmHg. There is a 26 mm St. Jude mechanical valve present in the aortic position. Procedure Date: 2005. Echo findings are consistent with normal structure and function of the aortic valve prosthesis. Pulmonic Valve: The pulmonic valve was not well visualized. Pulmonic valve regurgitation is not visualized. Aorta: Aortic dilatation noted. There is mild dilatation of the ascending aorta, measuring 42 mm. IAS/Shunts: No atrial level shunt detected by color flow Doppler.  LEFT VENTRICLE PLAX 2D LVIDd:         4.90 cm Diastology LVIDs:         2.60 cm LV e' medial:    6.53 cm/s LV PW:         1.20 cm LV E/e' medial:  18.4 LV IVS:        1.40 cm LV e' lateral:   10.90 cm/s                        LV E/e' lateral: 11.0  RIGHT VENTRICLE RV Basal diam:  2.90 cm LEFT ATRIUM             Index        RIGHT ATRIUM           Index LA diam:        5.10 cm 2.32 cm/m   RA Area:     13.20 cm LA Vol (A2C):   59.1 ml 26.92 ml/m  RA Volume:   25.60 ml  11.66 ml/m LA Vol (A4C):   51.3 ml 23.36 ml/m LA Biplane Vol: 56.6 ml 25.78 ml/m  AORTIC VALVE AV Vmax:      221.00 cm/s AV Vmean:     152.000 cm/s AV VTI:       0.462 m AV Peak Grad: 19.5 mmHg AV Mean Grad: 11.0 mmHg  AORTA Ao Root diam: 3.20 cm Ao Asc diam:  4.20 cm MITRAL VALVE                TRICUSPID VALVE MV Area (PHT): 2.48 cm     TR Peak grad:   27.5 mmHg MV Decel Time: 306 msec     TR Vmax:        262.00 cm/s MV E velocity: 120.00 cm/s MV A velocity: 155.00 cm/s MV E/A ratio:  0.77 Lyman Bishop MD Electronically signed by Lyman Bishop MD Signature Date/Time: 06/25/2021/4:09:48 PM     Final    CT HEAD CODE STROKE WO CONTRAST  Result Date: 06/24/2021 CLINICAL DATA:  Code stroke.  Aphasia.  Facial droop EXAM: CT HEAD WITHOUT CONTRAST TECHNIQUE: Contiguous axial images were obtained from the base of the skull through the vertex without intravenous contrast. RADIATION DOSE REDUCTION: This exam was performed according to the departmental dose-optimization program which includes automated exposure control, adjustment of the mA and/or kV according to patient size and/or use of iterative reconstruction technique. COMPARISON:  12/27/2020 CT head FINDINGS: Brain: No evidence of acute infarction, hemorrhage, cerebral edema, mass, mass effect, or midline shift. Ventricles and sulci are normal for age. No extra-axial fluid collection. Remote infarct in the left cerebellum. Vascular: No hyperdense vessel. Skull: Negative for fracture or focal lesion. Sinuses/Orbits: Mucosal thickening in the left-greater-than-right maxillary sinus and anterior ethmoid air cells. Other: The mastoid air cells are well aerated. ASPECTS [facility] Stroke Program Early CT Score) - Ganglionic level infarction (caudate,  lentiform nuclei, internal capsule, insula, M1-M3 cortex): 7 - Supraganglionic infarction (M4-M6 cortex): 3 Total score (0-10 with 10 being normal): 10 IMPRESSION: 1. No acute intracranial process. 2. ASPECTS is 10 Code stroke imaging results were communicated on 06/24/2021 at 7:51 pm to provider Rancour via telephone, who verbally acknowledged these results. Electronically Signed   By: Merilyn Baba M.D.   On: 06/24/2021 19:51     TODAY-DAY OF DISCHARGE:  Subjective:   [name] today has no headache,no chest abdominal pain,no new weakness tingling or numbness, feels much better wants to go home today.  Objective:   Blood pressure (!) 165/75, pulse (!) 57, temperature 98.7 F (37.1 C), temperature source Oral, resp. rate 18, height '5\' 10"'$  (1.778 m), weight 102.2 kg, SpO2 95 %. No intake or output  data in the 24 hours ending 06/26/21 0849 Filed Weights   06/24/21 1930 06/24/21 2005  Weight: 97.5 kg 102.2 kg    Exam: Awake Alert, Oriented *3, No new F.N deficits, Normal affect [place].AT,PERRAL Supple Neck,No JVD, No cervical lymphadenopathy appriciated.  Symmetrical Chest wall movement, Good air movement bilaterally, CTAB RRR,No Gallops,Rubs or new Murmurs, No Parasternal Heave +ve B.Sounds, Abd Soft, Non tender, No organomegaly appriciated, No rebound -guarding or rigidity. No Cyanosis, Clubbing or edema, No new Rash or bruise   PERTINENT RADIOLOGIC STUDIES: CT Angio Head W or Wo Contrast  Result Date: 06/24/2021 CLINICAL DATA:  Aphasia, facial droop, stroke suspected EXAM: CT ANGIOGRAPHY HEAD AND NECK TECHNIQUE: Multidetector CT imaging of the head and neck was performed using the standard protocol during bolus administration of intravenous contrast. Multiplanar CT image reconstructions and MIPs were obtained to evaluate the vascular anatomy. Carotid stenosis measurements (when applicable) are obtained utilizing NASCET criteria, using the distal internal carotid diameter as the denominator. RADIATION DOSE REDUCTION: This exam was performed according to the departmental dose-optimization program which includes automated exposure control, adjustment of the mA and/or kV according to patient size and/or use of iterative reconstruction technique. CONTRAST:  183m OMNIPAQUE IOHEXOL 350 MG/ML SOLN COMPARISON:  No prior CTA, correlation is made with CT head 06/24/2021 FINDINGS: CT HEAD FINDINGS For noncontrast findings, please see same day CT head. CTA NECK FINDINGS Aortic arch: Standard branching. Imaged portion shows no evidence of aneurysm or dissection. No significant stenosis of the major arch vessel origins. Aortic atherosclerosis. Right carotid system: No evidence of dissection, occlusion, or hemodynamically significant stenosis (greater than 50%). Calcifications at the bifurcation, in the  proximal right ICA, and in the distal right ICA are not hemodynamically significant. Left carotid system: No evidence of dissection, occlusion, or hemodynamically significant stenosis (greater than 50%). Calcifications at the bifurcation and in the left ICA are not hemodynamically significant. Vertebral arteries: No evidence of dissection, occlusion, or hemodynamically significant stenosis (greater than 50%). Skeleton: Diffusely sclerosis. Status post median sternotomy. Degenerative changes in the cervical spine. Other neck: No acute finding. Upper chest: No focal pulmonary opacity or pleural effusion. Review of the MIP images confirms the above findings CTA HEAD FINDINGS Anterior circulation: Both internal carotid arteries are patent to the termini, with moderate to severe narrowing in the right cavernous carotid, moderate narrowing in the right supraclinoid carotid, and moderate narrowing in the left cavernous carotid. Left A1 segment is patent. 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 Normal anterior communicating artery. Anterior cerebral arteries are patent to their distal aspects. No M1 stenosis or occlusion, although the right M1 is somewhat irregular. Normal MCA bifurcations. Distal MCA branches perfused and symmetric. Posterior circulation: Vertebral  arteries patent to the vertebrobasilar junction, with mild narrowing in the proximal left V4 and moderate stenosis in the distal left V4. Posterior inferior cerebellar arteries patent proximally. Basilar patent to its distal aspect but diminutive. Superior cerebellar arteries patent proximally. Possible hypoplastic P1 segments, with fetal or near fetal origin of the bilateral PCAs. PCAs perfused to their Venous sinuses: As permitted by contrast timing, patent. Anatomic variants: Fetal or near fetal origin of the bilateral PCAs. Review of the MIP images confirms the above findings IMPRESSION: 1. No intracranial large vessel occlusion. Moderate to severe narrowing of the right  cavernous carotid and moderate narrowing in the right supraclinoid carotid, left cavernous carotid, and distal left V4. 2.  No hemodynamically significant stenosis in the neck. 3. Diffuse osseous sclerosis, which is nonspecific but can be seen in the setting of renal osteodystrophy, Paget's disease, and diffuse metastatic disease, among other conditions. Electronically Signed   By: Merilyn Baba M.D.   On: 06/24/2021 20:34   CT Angio Neck W and/or Wo Contrast  Result Date: 06/24/2021 CLINICAL DATA:  Aphasia, facial droop, stroke suspected EXAM: CT ANGIOGRAPHY HEAD AND NECK TECHNIQUE: Multidetector CT imaging of the head and neck was performed using the standard protocol during bolus administration of intravenous contrast. Multiplanar CT image reconstructions and MIPs were obtained to evaluate the vascular anatomy. Carotid stenosis measurements (when applicable) are obtained utilizing NASCET criteria, using the distal internal carotid diameter as the denominator. RADIATION DOSE REDUCTION: This exam was performed according to the departmental dose-optimization program which includes automated exposure control, adjustment of the mA and/or kV according to patient size and/or use of iterative reconstruction technique. CONTRAST:  112m OMNIPAQUE IOHEXOL 350 MG/ML SOLN COMPARISON:  No prior CTA, correlation is made with CT head 06/24/2021 FINDINGS: CT HEAD FINDINGS For noncontrast findings, please see same day CT head. CTA NECK FINDINGS Aortic arch: Standard branching. Imaged portion shows no evidence of aneurysm or dissection. No significant stenosis of the major arch vessel origins. Aortic atherosclerosis. Right carotid system: No evidence of dissection, occlusion, or hemodynamically significant stenosis (greater than 50%). Calcifications at the bifurcation, in the proximal right ICA, and in the distal right ICA are not hemodynamically significant. Left carotid system: No evidence of dissection, occlusion, or  hemodynamically significant stenosis (greater than 50%). Calcifications at the bifurcation and in the left ICA are not hemodynamically significant. Vertebral arteries: No evidence of dissection, occlusion, or hemodynamically significant stenosis (greater than 50%). Skeleton: Diffusely sclerosis. Status post median sternotomy. Degenerative changes in the cervical spine. Other neck: No acute finding. Upper chest: No focal pulmonary opacity or pleural effusion. Review of the MIP images confirms the above findings CTA HEAD FINDINGS Anterior circulation: Both internal carotid arteries are patent to the termini, with moderate to severe narrowing in the right cavernous carotid, moderate narrowing in the right supraclinoid carotid, and moderate narrowing in the left cavernous carotid. Left A1 segment is patent. 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 Normal anterior communicating artery. Anterior cerebral arteries are patent to their distal aspects. No M1 stenosis or occlusion, although the right M1 is somewhat irregular. Normal MCA bifurcations. Distal MCA branches perfused and symmetric. Posterior circulation: Vertebral arteries patent to the vertebrobasilar junction, with mild narrowing in the proximal left V4 and moderate stenosis in the distal left V4. Posterior inferior cerebellar arteries patent proximally. Basilar patent to its distal aspect but diminutive. Superior cerebellar arteries patent proximally. Possible hypoplastic P1 segments, with fetal or near fetal origin of the bilateral PCAs. PCAs perfused to their  Venous sinuses: As permitted by contrast timing, patent. Anatomic variants: Fetal or near fetal origin of the bilateral PCAs. Review of the MIP images confirms the above findings IMPRESSION: 1. No intracranial large vessel occlusion. Moderate to severe narrowing of the right cavernous carotid and moderate narrowing in the right supraclinoid carotid, left cavernous carotid, and distal left V4. 2.  No hemodynamically  significant stenosis in the neck. 3. Diffuse osseous sclerosis, which is nonspecific but can be seen in the setting of renal osteodystrophy, Paget's disease, and diffuse metastatic disease, among other conditions. Electronically Signed   By: Merilyn Baba M.D.   On: 06/24/2021 20:34   MR BRAIN WO CONTRAST  Result Date: 06/25/2021 CLINICAL DATA:  Neuro deficit with stroke suspected EXAM: MRI HEAD WITHOUT CONTRAST TECHNIQUE: Multiplanar, multiecho pulse sequences of the brain and surrounding structures were obtained without intravenous contrast. COMPARISON:  Head CT and CTA from yesterday FINDINGS: Brain: Acute infarct affecting cortex at the upper left insula and adjacent frontal lobe. Mild extension into the subjacent white matter at the level of the frontal lobe. No infarct in the separate vascular distribution. Chronic small vessel ischemia in the cerebral white matter which is mild for age. Multiple small remote cerebellar infarcts. Cerebral volume loss which is mild for age. No hemorrhage, hydrocephalus, or masslike finding Vascular: Major flow voids are preserved Skull and upper cervical spine: Normal marrow signal Sinuses/Orbits: Small retention cysts along the floors of the maxillary sinuses. IMPRESSION: 1. Small to moderate left MCA branch infarct affecting left insular and frontal cortex. 2. Small chronic cerebellar infarcts. Mild chronic microvascular ischemia and cerebral volume loss for age. Electronically Signed   By: Jorje Guild M.D.   On: 06/25/2021 09:23   ECHOCARDIOGRAM COMPLETE  Result Date: 06/25/2021    ECHOCARDIOGRAM REPORT   Patient Name:   [name] Date of Exam: 06/25/2021 Medical Rec #:  [id number]        Height:       70.0 in Accession #:    [id number]       Weight:       225.3 lb Date of Birth:  03/08/1945        BSA:          2.196 m Patient Age:    74 years         BP:           178/76 mmHg Patient Gender: M                HR:           62 bpm. Exam Location:  Inpatient  Procedure: 2D Echo, Cardiac Doppler, Color Doppler and Intracardiac            Opacification Agent Indications:    Stroke  History:        Patient has prior history of Echocardiogram examinations, most                 recent 10/11/2020. CAD; Prior CABG. 61m St Jude aortic position                 and CABG 2005.                 Aortic Valve: 26 mm St. Jude mechanical valve is present in the                 aortic position. Procedure Date: 2005.  Sonographer:    RMerrie RoofRDCS Referring Phys: [id number]  XU IMPRESSIONS  1. No apical thrombus with Definity contrast. Left ventricular ejection fraction, by estimation, is >75%. The left ventricle has hyperdynamic function. The left ventricle has no regional wall motion abnormalities. There is moderate left ventricular hypertrophy of the basal-septal segment. Left ventricular diastolic parameters are consistent with Grade I diastolic dysfunction (impaired relaxation). Elevated left ventricular end-diastolic pressure. The E/e' is 18.  2. Right ventricular systolic function is normal. The right ventricular size is normal.  3. The mitral valve is grossly normal. Trivial mitral valve regurgitation.  4. The aortic valve was not well visualized. Aortic valve regurgitation is trivial. There is a 26 mm St. Jude mechanical valve present in the aortic position. Procedure Date: 2005. Echo findings are consistent with normal structure and function of the aortic valve prosthesis. Aortic valve mean gradient measures 11.0 mmHg.  5. Aortic dilatation noted. There is mild dilatation of the ascending aorta, measuring 42 mm. Comparison(s): Changes from prior study are noted. 10/11/2020: LVEF 60-65%, St. Jude aortic valve with 9.8 mmHg gradient, dilated aorta to 43 mm. FINDINGS  Left Ventricle: No apical thrombus with Definity contrast. Left ventricular ejection fraction, by estimation, is >75%. The left ventricle has hyperdynamic function. The left ventricle has no regional wall motion  abnormalities. The left ventricular internal cavity size was normal in size. There is moderate left ventricular hypertrophy of the basal-septal segment. Left ventricular diastolic parameters are consistent with Grade I diastolic dysfunction (impaired relaxation). Elevated left ventricular end-diastolic pressure. The E/e' is 15. Right Ventricle: The right ventricular size is normal. No increase in right ventricular wall thickness. Right ventricular systolic function is normal. Left Atrium: Left atrial size was normal in size. Right Atrium: Right atrial size was normal in size. Pericardium: There is no evidence of pericardial effusion. Mitral Valve: The mitral valve is grossly normal. Trivial mitral valve regurgitation. Tricuspid Valve: The tricuspid valve is grossly normal. Tricuspid valve regurgitation is trivial. Aortic Valve: The aortic valve was not well visualized. Aortic valve regurgitation is trivial. Aortic valve mean gradient measures 11.0 mmHg. Aortic valve peak gradient measures 19.5 mmHg. There is a 26 mm St. Jude mechanical valve present in the aortic position. Procedure Date: 2005. Echo findings are consistent with normal structure and function of the aortic valve prosthesis. Pulmonic Valve: The pulmonic valve was not well visualized. Pulmonic valve regurgitation is not visualized. Aorta: Aortic dilatation noted. There is mild dilatation of the ascending aorta, measuring 42 mm. IAS/Shunts: No atrial level shunt detected by color flow Doppler.  LEFT VENTRICLE PLAX 2D LVIDd:         4.90 cm Diastology LVIDs:         2.60 cm LV e' medial:    6.53 cm/s LV PW:         1.20 cm LV E/e' medial:  18.4 LV IVS:        1.40 cm LV e' lateral:   10.90 cm/s                        LV E/e' lateral: 11.0  RIGHT VENTRICLE RV Basal diam:  2.90 cm LEFT ATRIUM             Index        RIGHT ATRIUM           Index LA diam:        5.10 cm 2.32 cm/m   RA Area:     13.20 cm LA Vol (A2C):   59.1  ml 26.92 ml/m  RA Volume:    25.60 ml  11.66 ml/m LA Vol (A4C):   51.3 ml 23.36 ml/m LA Biplane Vol: 56.6 ml 25.78 ml/m  AORTIC VALVE AV Vmax:      221.00 cm/s AV Vmean:     152.000 cm/s AV VTI:       0.462 m AV Peak Grad: 19.5 mmHg AV Mean Grad: 11.0 mmHg  AORTA Ao Root diam: 3.20 cm Ao Asc diam:  4.20 cm MITRAL VALVE                TRICUSPID VALVE MV Area (PHT): 2.48 cm     TR Peak grad:   27.5 mmHg MV Decel Time: 306 msec     TR Vmax:        262.00 cm/s MV E velocity: 120.00 cm/s MV A velocity: 155.00 cm/s MV E/A ratio:  0.77 Lyman Bishop MD Electronically signed by Lyman Bishop MD Signature Date/Time: 06/25/2021/4:09:48 PM    Final    CT HEAD CODE STROKE WO CONTRAST  Result Date: 06/24/2021 CLINICAL DATA:  Code stroke.  Aphasia.  Facial droop EXAM: CT HEAD WITHOUT CONTRAST TECHNIQUE: Contiguous axial images were obtained from the base of the skull through the vertex without intravenous contrast. RADIATION DOSE REDUCTION: This exam was performed according to the departmental dose-optimization program which includes automated exposure control, adjustment of the mA and/or kV according to patient size and/or use of iterative reconstruction technique. COMPARISON:  12/27/2020 CT head FINDINGS: Brain: No evidence of acute infarction, hemorrhage, cerebral edema, mass, mass effect, or midline shift. Ventricles and sulci are normal for age. No extra-axial fluid collection. Remote infarct in the left cerebellum. Vascular: No hyperdense vessel. Skull: Negative for fracture or focal lesion. Sinuses/Orbits: Mucosal thickening in the left-greater-than-right maxillary sinus and anterior ethmoid air cells. Other: The mastoid air cells are well aerated. ASPECTS [facility] Stroke Program Early CT Score) - Ganglionic level infarction (caudate, lentiform nuclei, internal capsule, insula, M1-M3 cortex): 7 - Supraganglionic infarction (M4-M6 cortex): 3 Total score (0-10 with 10 being normal): 10 IMPRESSION: 1. No acute intracranial process. 2. ASPECTS is  10 Code stroke imaging results were communicated on 06/24/2021 at 7:51 pm to provider Rancour via telephone, who verbally acknowledged these results. Electronically Signed   By: Merilyn Baba M.D.   On: 06/24/2021 19:51     PERTINENT LAB RESULTS: CBC: Recent Labs    06/24/21 1940 06/26/21 0212  WBC 8.4 7.7  HGB 15.2 15.0  HCT 44.3 43.7  PLT 235 227   CMET CMP     Component Value Date/Time   NA 138 06/26/2021 0212   NA 133 (L) 03/30/2016 1507   NA 138 09/23/2015 1020   K 3.9 06/26/2021 0212   K 4.3 03/30/2016 1507   K 4.4 09/23/2015 1020   CL 106 06/26/2021 0212   CL 99 03/30/2016 1507   CO2 23 06/26/2021 0212   CO2 25 03/30/2016 1507   CO2 25 09/23/2015 1020   GLUCOSE 170 (H) 06/26/2021 0212   GLUCOSE 128 09/23/2015 1020   BUN 25 (H) 06/26/2021 0212   BUN 30 (A) 04/15/2021 0000   BUN 28.4 (H) 09/23/2015 1020   CREATININE 1.51 (H) 06/26/2021 0212   CREATININE 1.63 (H) 09/22/2019 1119   CREATININE 1.3 09/23/2015 1020   CALCIUM 9.2 06/26/2021 0212   CALCIUM 10.1 03/30/2016 1507   CALCIUM 9.5 09/23/2015 1020   PROT 7.9 06/24/2021 1940   PROT 7.2 03/24/2021 0815   PROT 7.4 09/23/2015  1020   ALBUMIN 4.1 06/24/2021 1940   ALBUMIN 4.2 03/24/2021 0815   ALBUMIN 4.8 03/30/2016 1507   ALBUMIN 3.8 09/23/2015 1020   AST 32 06/24/2021 1940   AST 25 04/10/2019 1300   AST 41 (H) 09/23/2015 1020   ALT 24 06/24/2021 1940   ALT 20 04/10/2019 1300   ALT 29 09/23/2015 1020   ALKPHOS 56 06/24/2021 1940   ALKPHOS 45 03/30/2016 1507   ALKPHOS 46 09/23/2015 1020   BILITOT 0.5 06/24/2021 1940   BILITOT 0.3 03/24/2021 0815   BILITOT 0.5 04/10/2019 1300   BILITOT 0.46 09/23/2015 1020   GFRNONAA 48 (L) 06/26/2021 0212   GFRNONAA 39 (L) 04/10/2019 1300   GFRNONAA 63 05/31/2012 0818   GFRAA 45 (L) 04/10/2019 1300   GFRAA 73 05/31/2012 0818    GFR Estimated Creatinine Clearance: 49.9 mL/min (A) (by C-G formula based on SCr of 1.51 mg/dL (H)). No results for input(s): LIPASE,  AMYLASE in the last 72 hours. No results for input(s): CKTOTAL, CKMB, CKMBINDEX, TROPONINI in the last 72 hours. Invalid input(s): POCBNP No results for input(s): DDIMER in the last 72 hours. Recent Labs    06/25/21 0358  HGBA1C 8.3*   Recent Labs    06/25/21 0358  CHOL 143  HDL 30*  LDLCALC 65  TRIG 240*  CHOLHDL 4.8   No results for input(s): TSH, T4TOTAL, T3FREE, THYROIDAB in the last 72 hours.  Invalid input(s): FREET3 No results for input(s): VITAMINB12, FOLATE, FERRITIN, TIBC, IRON, RETICCTPCT in the last 72 hours. Coags: Recent Labs    06/24/21 1940 06/26/21 0212  INR 2.7* 1.9*   Microbiology: Recent Results (from the past 240 hour(s))  Resp Panel by RT-PCR (Flu A&B, Covid) Anterior Nasal Swab     Status: None   Collection Time: 06/24/21  9:20 PM   Specimen: Anterior Nasal Swab  Result Value Ref Range Status   SARS Coronavirus 2 by RT PCR NEGATIVE NEGATIVE Final    Comment: (NOTE) SARS-CoV-2 target nucleic acids are NOT DETECTED.  The SARS-CoV-2 RNA is generally detectable in upper respiratory specimens during the acute phase of infection. The lowest concentration of SARS-CoV-2 viral copies this assay can detect is 138 copies/mL. A negative result does not preclude SARS-Cov-2 infection and should not be used as the sole basis for treatment or other patient management decisions. A negative result may occur with  improper specimen collection/handling, submission of specimen other than nasopharyngeal swab, presence of viral mutation(s) within the areas targeted by this assay, and inadequate number of viral copies(<138 copies/mL). A negative result must be combined with clinical observations, patient history, and epidemiological information. The expected result is Negative.  Fact Sheet for Patients:  EntrepreneurPulse.com.au  Fact Sheet for Healthcare Providers:  IncredibleEmployment.be  This test is no t yet approved or  cleared by the [place] FDA and  has been authorized for detection and/or diagnosis of SARS-CoV-2 by FDA under an Emergency Use Authorization (EUA). This EUA will remain  in effect (meaning this test can be used) for the duration of the COVID-19 declaration under Section 564(b)(1) of the Act, 21 U.S.C.section 360bbb-3(b)(1), unless the authorization is terminated  or revoked sooner.       Influenza A by PCR NEGATIVE NEGATIVE Final   Influenza B by PCR NEGATIVE NEGATIVE Final    Comment: (NOTE) The Xpert Xpress SARS-CoV-2/FLU/RSV plus assay is intended as an aid in the diagnosis of influenza from Nasopharyngeal swab specimens and should not be used as a sole basis  for treatment. Nasal washings and aspirates are unacceptable for Xpert Xpress SARS-CoV-2/FLU/RSV testing.  Fact Sheet for Patients: EntrepreneurPulse.com.au  Fact Sheet for Healthcare Providers: IncredibleEmployment.be  This test is not yet approved or cleared by the [place] FDA and has been authorized for detection and/or diagnosis of SARS-CoV-2 by FDA under an Emergency Use Authorization (EUA). This EUA will remain in effect (meaning this test can be used) for the duration of the COVID-19 declaration under Section 564(b)(1) of the Act, 21 U.S.C. section 360bbb-3(b)(1), unless the authorization is terminated or revoked.  Performed at [facility], [address] [place], [place] [address]     FURTHER DISCHARGE INSTRUCTIONS:  Get Medicines reviewed and adjusted: Please take all your medications with you for your next visit with your Primary MD  Laboratory/radiological data: Please request your Primary MD to go over all hospital tests and procedure/radiological results at the follow up, please ask your Primary MD to get all Hospital records sent to his/her office.  In some cases, they will be blood work, cultures and biopsy results pending at the time of  your discharge. Please request that your primary care M.D. goes through all the records of your hospital data and follows up on these results.  Also Note the following: If you experience worsening of your admission symptoms, develop shortness of breath, life threatening emergency, suicidal or homicidal thoughts you must seek medical attention immediately by calling 911 or calling your MD immediately  if symptoms less severe.  You must read complete instructions/literature along with all the possible adverse reactions/side effects for all the Medicines you take and that have been prescribed to you. Take any new Medicines after you have completely understood and accpet all the possible adverse reactions/side effects.   Do not drive when taking Pain medications or sleeping medications (Benzodaizepines)  Do not take more than prescribed Pain, Sleep and Anxiety Medications. It is not advisable to combine anxiety,sleep and pain medications without talking with your primary care practitioner  Special Instructions: If you have smoked or chewed Tobacco  in the last 2 yrs please stop smoking, stop any regular Alcohol  and or any Recreational drug use.  Wear Seat belts while driving.  Please note: You were cared for by a hospitalist during your hospital stay. Once you are discharged, your primary care physician will handle any further medical issues. Please note that NO REFILLS for any discharge medications will be authorized once you are discharged, as it is imperative that you return to your primary care physician (or establish a relationship with a primary care physician if you do not have one) for your post hospital discharge needs so that they can reassess your need for medications and monitor your lab values.  Total Time spent coordinating discharge including counseling, education and face to face time equals greater than 30 minutes.  SignedOren Binet 06/26/2021 8:49 AM

## 2021-06-26 NOTE — Progress Notes (Signed)
[name] to be D/C'd home per MD order. Discussed with the patient and wife and all questions fully answered.  Skin clean, dry and intact without evidence of skin break down, no evidence of skin tears noted.  IV catheter discontinued intact. Site without signs and symptoms of complications. Dressing and pressure applied.  An After Visit Summary was printed and given to the patient. Patient able to self administer lovenox. Patient escorted via [place], and D/C home via private auto.  [name]  06/26/2021 12:52 PM

## 2021-06-26 NOTE — Progress Notes (Signed)
ANTICOAGULATION CONSULT NOTE - Initial Consult  Pharmacy Consult for Lovenox bridge to Warfarin Indication: Mechanical Valve  No Known Allergies  Patient Measurements: Height: '5\' 10"'$  (177.8 cm) Weight: 102.2 kg (225 lb 5 oz) IBW/kg (Calculated) : 73  Vital Signs: Temp: 97.9 F (36.6 C) (05/28 0459) Temp Source: Axillary (05/28 0459) BP: 156/68 (05/28 0459) Pulse Rate: 60 (05/28 0459)  Labs: Recent Labs    06/24/21 1940 06/25/21 0358 06/26/21 0212  HGB 15.2  --  15.0  HCT 44.3  --  43.7  PLT 235  --  227  APTT 33  --   --   LABPROT 28.1*  --  21.5*  INR 2.7*  --  1.9*  CREATININE 1.50* 1.35* 1.51*  TROPONINIHS  --  25*  --      Estimated Creatinine Clearance: 49.9 mL/min (A) (by C-G formula based on SCr of 1.51 mg/dL (H)).   Medical History: Past Medical History:  Diagnosis Date   Allergy    Aortic stenosis    Arthritis    CAD (coronary artery disease)    cabg   Cancer ([place]) 01/30/2009   prostate, skin   Chronic kidney disease    Complication of anesthesia    Erectile dysfunction    Heart murmur    Hemochromatosis 02/14/2011   Hemorrhoids    History of gout    History of hepatitis 01/31/1972   Hyperlipidemia    Hypertension    Hypertr obst cardiomyop    Mechanical heart valve present    Manufacturer: [employer]le[employer] #: [id number]  Model #: [id number]. Card states MRI compatible with 3 teslas or less.   Obesity    moderate   PONV (postoperative nausea and vomiting)    after CABG- slow to wake up   Sensorineural hearing loss    Sleep apnea    Stroke [facility])    Old stroke - patient was not aware.   Type II or unspecified type diabetes mellitus without mention of complication, not stated as uncontrolled     Assessment: [name] is a 76 year old male presenting with aphasia. He is on warfarin PTA for mechanical heart valve. INR on 2.7 on 5/26 and CBC stable. Will resume home warfarin regimen.   5/28 INR is subtherapeutic at 1.9- spoke with Dr Sloan Leiter  and we will bridge with lovenox.  CBC stable and no signs/symptoms of bleeding noted.  PTA warfarin regimen:  7.'5mg'$  on Sun, Tues, Thurs, Sat 3.75 mg (1/2 tab) Mon, Wed, Fri   Goal of Therapy:  Goal INR: 2.5-3.5 Monitor platelets by anticoagulation protocol: Yes   Plan:  Warfarin 7.5 mg x1 dose at 1600 Start Lovenox 100 mg ('1mg'$ /kg) q12h Daily INR and CBC Monitor for signs and symptoms of bleeding  [name], PharmD PGY2 Infectious Diseases Pharmacy Resident   Please check [email or web address] for unit-specific pharmacy phone numbers

## 2021-06-26 NOTE — Progress Notes (Signed)
Discharge instructions given. Patient verbalized understanding and all questions were answered.  ?

## 2021-06-26 NOTE — Progress Notes (Signed)
STROKE TEAM PROGRESS NOTE   SUBJECTIVE (INTERVAL HISTORY) No family is at the bedside.  No acute event overnight. He still has partial expressive aphasia with word finding difficulties, but naming has improved from yesterday. His INR today 1.9, now on coumadin with lovenox bridge.   OBJECTIVE Temp:  [97.9 F (36.6 C)-98.7 F (37.1 C)] 98.7 F (37.1 C) (05/28 0802) Pulse Rate:  [57-62] 57 (05/28 0802) Cardiac Rhythm: Sinus bradycardia (05/28 0703) Resp:  [18] 18 (05/28 0802) BP: (156-165)/(63-75) 165/75 (05/28 0802) SpO2:  [95 %-97 %] 95 % (05/28 0802)  Recent Labs  Lab 06/25/21 1608 06/25/21 1953 06/25/21 2335 06/26/21 0455 06/26/21 0801  GLUCAP 165* 160* 146* 200* 171*   Recent Labs  Lab 06/24/21 1940 06/25/21 0358 06/26/21 0212  NA 137 137 138  K 3.5 3.4* 3.9  CL 103 103 106  CO2 '25 24 23  '$ GLUCOSE 182* 151* 170*  BUN 30* 26* 25*  CREATININE 1.50* 1.35* 1.51*  CALCIUM 9.0 9.0 9.2   Recent Labs  Lab 06/24/21 1940  AST 32  ALT 24  ALKPHOS 56  BILITOT 0.5  PROT 7.9  ALBUMIN 4.1   Recent Labs  Lab 06/24/21 1940 06/26/21 0212  WBC 8.4 7.7  NEUTROABS 5.8  --   HGB 15.2 15.0  HCT 44.3 43.7  MCV 87.7 88.6  PLT 235 227   No results for input(s): CKTOTAL, CKMB, CKMBINDEX, TROPONINI in the last 168 hours. Recent Labs    06/24/21 1940 06/26/21 0212  LABPROT 28.1* 21.5*  INR 2.7* 1.9*   Recent Labs    06/24/21 2105  COLORURINE YELLOW  LABSPEC 1.015  PHURINE 7.0  GLUCOSEU >=500*  HGBUR NEGATIVE  BILIRUBINUR NEGATIVE  KETONESUR NEGATIVE  PROTEINUR NEGATIVE  NITRITE NEGATIVE  LEUKOCYTESUR NEGATIVE       Component Value Date/Time   CHOL 143 06/25/2021 0358   CHOL 162 03/24/2021 0815   TRIG 240 (H) 06/25/2021 0358   HDL 30 (L) 06/25/2021 0358   HDL 29 (L) 03/24/2021 0815   CHOLHDL 4.8 06/25/2021 0358   VLDL 48 (H) 06/25/2021 0358   LDLCALC 65 06/25/2021 0358   LDLCALC 77 03/24/2021 0815   LDLCALC 99 09/22/2019 1119   Lab Results   Component Value Date   HGBA1C 8.3 (H) 06/25/2021      Component Value Date/Time   LABOPIA NONE DETECTED 06/24/2021 2104   COCAINSCRNUR NONE DETECTED 06/24/2021 2104   LABBENZ NONE DETECTED 06/24/2021 2104   AMPHETMU NONE DETECTED 06/24/2021 2104   THCU NONE DETECTED 06/24/2021 2104   LABBARB NONE DETECTED 06/24/2021 2104    Recent Labs  Lab 06/24/21 1940  ETH <10    I have personally reviewed the radiological images below and agree with the radiology interpretations.  CT Angio Head W or Wo Contrast  Result Date: 06/24/2021 CLINICAL DATA:  Aphasia, facial droop, stroke suspected EXAM: CT ANGIOGRAPHY HEAD AND NECK TECHNIQUE: Multidetector CT imaging of the head and neck was performed using the standard protocol during bolus administration of intravenous contrast. Multiplanar CT image reconstructions and MIPs were obtained to evaluate the vascular anatomy. Carotid stenosis measurements (when applicable) are obtained utilizing NASCET criteria, using the distal internal carotid diameter as the denominator. RADIATION DOSE REDUCTION: This exam was performed according to the departmental dose-optimization program which includes automated exposure control, adjustment of the mA and/or kV according to patient size and/or use of iterative reconstruction technique. CONTRAST:  174m OMNIPAQUE IOHEXOL 350 MG/ML SOLN COMPARISON:  No prior CTA, correlation is made  with CT head 06/24/2021 FINDINGS: CT HEAD FINDINGS For noncontrast findings, please see same day CT head. CTA NECK FINDINGS Aortic arch: Standard branching. Imaged portion shows no evidence of aneurysm or dissection. No significant stenosis of the major arch vessel origins. Aortic atherosclerosis. Right carotid system: No evidence of dissection, occlusion, or hemodynamically significant stenosis (greater than 50%). Calcifications at the bifurcation, in the proximal right ICA, and in the distal right ICA are not hemodynamically significant. Left  carotid system: No evidence of dissection, occlusion, or hemodynamically significant stenosis (greater than 50%). Calcifications at the bifurcation and in the left ICA are not hemodynamically significant. Vertebral arteries: No evidence of dissection, occlusion, or hemodynamically significant stenosis (greater than 50%). Skeleton: Diffusely sclerosis. Status post median sternotomy. Degenerative changes in the cervical spine. Other neck: No acute finding. Upper chest: No focal pulmonary opacity or pleural effusion. Review of the MIP images confirms the above findings CTA HEAD FINDINGS Anterior circulation: Both internal carotid arteries are patent to the termini, with moderate to severe narrowing in the right cavernous carotid, moderate narrowing in the right supraclinoid carotid, and moderate narrowing in the left cavernous carotid. Left A1 segment is patent. 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 Normal anterior communicating artery. Anterior cerebral arteries are patent to their distal aspects. No M1 stenosis or occlusion, although the right M1 is somewhat irregular. Normal MCA bifurcations. Distal MCA branches perfused and symmetric. Posterior circulation: Vertebral arteries patent to the vertebrobasilar junction, with mild narrowing in the proximal left V4 and moderate stenosis in the distal left V4. Posterior inferior cerebellar arteries patent proximally. Basilar patent to its distal aspect but diminutive. Superior cerebellar arteries patent proximally. Possible hypoplastic P1 segments, with fetal or near fetal origin of the bilateral PCAs. PCAs perfused to their Venous sinuses: As permitted by contrast timing, patent. Anatomic variants: Fetal or near fetal origin of the bilateral PCAs. Review of the MIP images confirms the above findings IMPRESSION: 1. No intracranial large vessel occlusion. Moderate to severe narrowing of the right cavernous carotid and moderate narrowing in the right supraclinoid carotid, left cavernous  carotid, and distal left V4. 2.  No hemodynamically significant stenosis in the neck. 3. Diffuse osseous sclerosis, which is nonspecific but can be seen in the setting of renal osteodystrophy, Paget's disease, and diffuse metastatic disease, among other conditions. Electronically Signed   By: Merilyn Baba M.D.   On: 06/24/2021 20:34   CT Angio Neck W and/or Wo Contrast  Result Date: 06/24/2021 CLINICAL DATA:  Aphasia, facial droop, stroke suspected EXAM: CT ANGIOGRAPHY HEAD AND NECK TECHNIQUE: Multidetector CT imaging of the head and neck was performed using the standard protocol during bolus administration of intravenous contrast. Multiplanar CT image reconstructions and MIPs were obtained to evaluate the vascular anatomy. Carotid stenosis measurements (when applicable) are obtained utilizing NASCET criteria, using the distal internal carotid diameter as the denominator. RADIATION DOSE REDUCTION: This exam was performed according to the departmental dose-optimization program which includes automated exposure control, adjustment of the mA and/or kV according to patient size and/or use of iterative reconstruction technique. CONTRAST:  166m OMNIPAQUE IOHEXOL 350 MG/ML SOLN COMPARISON:  No prior CTA, correlation is made with CT head 06/24/2021 FINDINGS: CT HEAD FINDINGS For noncontrast findings, please see same day CT head. CTA NECK FINDINGS Aortic arch: Standard branching. Imaged portion shows no evidence of aneurysm or dissection. No significant stenosis of the major arch vessel origins. Aortic atherosclerosis. Right carotid system: No evidence of dissection, occlusion, or hemodynamically significant stenosis (greater than 50%).  Calcifications at the bifurcation, in the proximal right ICA, and in the distal right ICA are not hemodynamically significant. Left carotid system: No evidence of dissection, occlusion, or hemodynamically significant stenosis (greater than 50%). Calcifications at the bifurcation and in  the left ICA are not hemodynamically significant. Vertebral arteries: No evidence of dissection, occlusion, or hemodynamically significant stenosis (greater than 50%). Skeleton: Diffusely sclerosis. Status post median sternotomy. Degenerative changes in the cervical spine. Other neck: No acute finding. Upper chest: No focal pulmonary opacity or pleural effusion. Review of the MIP images confirms the above findings CTA HEAD FINDINGS Anterior circulation: Both internal carotid arteries are patent to the termini, with moderate to severe narrowing in the right cavernous carotid, moderate narrowing in the right supraclinoid carotid, and moderate narrowing in the left cavernous carotid. Left A1 segment is patent. 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 Normal anterior communicating artery. Anterior cerebral arteries are patent to their distal aspects. No M1 stenosis or occlusion, although the right M1 is somewhat irregular. Normal MCA bifurcations. Distal MCA branches perfused and symmetric. Posterior circulation: Vertebral arteries patent to the vertebrobasilar junction, with mild narrowing in the proximal left V4 and moderate stenosis in the distal left V4. Posterior inferior cerebellar arteries patent proximally. Basilar patent to its distal aspect but diminutive. Superior cerebellar arteries patent proximally. Possible hypoplastic P1 segments, with fetal or near fetal origin of the bilateral PCAs. PCAs perfused to their Venous sinuses: As permitted by contrast timing, patent. Anatomic variants: Fetal or near fetal origin of the bilateral PCAs. Review of the MIP images confirms the above findings IMPRESSION: 1. No intracranial large vessel occlusion. Moderate to severe narrowing of the right cavernous carotid and moderate narrowing in the right supraclinoid carotid, left cavernous carotid, and distal left V4. 2.  No hemodynamically significant stenosis in the neck. 3. Diffuse osseous sclerosis, which is nonspecific but can be seen  in the setting of renal osteodystrophy, Paget's disease, and diffuse metastatic disease, among other conditions. Electronically Signed   By: Merilyn Baba M.D.   On: 06/24/2021 20:34   MR BRAIN WO CONTRAST  Result Date: 06/25/2021 CLINICAL DATA:  Neuro deficit with stroke suspected EXAM: MRI HEAD WITHOUT CONTRAST TECHNIQUE: Multiplanar, multiecho pulse sequences of the brain and surrounding structures were obtained without intravenous contrast. COMPARISON:  Head CT and CTA from yesterday FINDINGS: Brain: Acute infarct affecting cortex at the upper left insula and adjacent frontal lobe. Mild extension into the subjacent white matter at the level of the frontal lobe. No infarct in the separate vascular distribution. Chronic small vessel ischemia in the cerebral white matter which is mild for age. Multiple small remote cerebellar infarcts. Cerebral volume loss which is mild for age. No hemorrhage, hydrocephalus, or masslike finding Vascular: Major flow voids are preserved Skull and upper cervical spine: Normal marrow signal Sinuses/Orbits: Small retention cysts along the floors of the maxillary sinuses. IMPRESSION: 1. Small to moderate left MCA branch infarct affecting left insular and frontal cortex. 2. Small chronic cerebellar infarcts. Mild chronic microvascular ischemia and cerebral volume loss for age. Electronically Signed   By: Jorje Guild M.D.   On: 06/25/2021 09:23   ECHOCARDIOGRAM COMPLETE  Result Date: 06/25/2021    ECHOCARDIOGRAM REPORT   Patient Name:   NICKALAS MCCARRICK Date of Exam: 06/25/2021 Medical Rec #:  [id number]        Height:       70.0 in Accession #:    [id number]       Weight:  225.3 lb Date of Birth:  08-06-1945        BSA:          2.196 m Patient Age:    75 years         BP:           178/76 mmHg Patient Gender: M                HR:           62 bpm. Exam Location:  Inpatient Procedure: 2D Echo, Cardiac Doppler, Color Doppler and Intracardiac            Opacification Agent  Indications:    Stroke  History:        Patient has prior history of Echocardiogram examinations, most                 recent 10/11/2020. CAD; Prior CABG. 82m St Jude aortic position                 and CABG 2005.                 Aortic Valve: 26 mm St. Jude mechanical valve is present in the                 aortic position. Procedure Date: 2005.  Sonographer:    RMerrie RoofRDCS Referring Phys: [id number] 1. No apical thrombus with Definity contrast. Left ventricular ejection fraction, by estimation, is >75%. The left ventricle has hyperdynamic function. The left ventricle has no regional wall motion abnormalities. There is moderate left ventricular hypertrophy of the basal-septal segment. Left ventricular diastolic parameters are consistent with Grade I diastolic dysfunction (impaired relaxation). Elevated left ventricular end-diastolic pressure. The E/e' is 161  2. Right ventricular systolic function is normal. The right ventricular size is normal.  3. The mitral valve is grossly normal. Trivial mitral valve regurgitation.  4. The aortic valve was not well visualized. Aortic valve regurgitation is trivial. There is a 26 mm St. Jude mechanical valve present in the aortic position. Procedure Date: 2005. Echo findings are consistent with normal structure and function of the aortic valve prosthesis. Aortic valve mean gradient measures 11.0 mmHg.  5. Aortic dilatation noted. There is mild dilatation of the ascending aorta, measuring 42 mm. Comparison(s): Changes from prior study are noted. 10/11/2020: LVEF 60-65%, St. Jude aortic valve with 9.8 mmHg gradient, dilated aorta to 43 mm. FINDINGS  Left Ventricle: No apical thrombus with Definity contrast. Left ventricular ejection fraction, by estimation, is >75%. The left ventricle has hyperdynamic function. The left ventricle has no regional wall motion abnormalities. The left ventricular internal cavity size was normal in size. There is moderate left  ventricular hypertrophy of the basal-septal segment. Left ventricular diastolic parameters are consistent with Grade I diastolic dysfunction (impaired relaxation). Elevated left ventricular end-diastolic pressure. The E/e' is 15. Right Ventricle: The right ventricular size is normal. No increase in right ventricular wall thickness. Right ventricular systolic function is normal. Left Atrium: Left atrial size was normal in size. Right Atrium: Right atrial size was normal in size. Pericardium: There is no evidence of pericardial effusion. Mitral Valve: The mitral valve is grossly normal. Trivial mitral valve regurgitation. Tricuspid Valve: The tricuspid valve is grossly normal. Tricuspid valve regurgitation is trivial. Aortic Valve: The aortic valve was not well visualized. Aortic valve regurgitation is trivial. Aortic valve mean gradient measures 11.0 mmHg. Aortic valve peak gradient measures 19.5 mmHg. There is  a 26 mm St. Jude mechanical valve present in the aortic position. Procedure Date: 2005. Echo findings are consistent with normal structure and function of the aortic valve prosthesis. Pulmonic Valve: The pulmonic valve was not well visualized. Pulmonic valve regurgitation is not visualized. Aorta: Aortic dilatation noted. There is mild dilatation of the ascending aorta, measuring 42 mm. IAS/Shunts: No atrial level shunt detected by color flow Doppler.  LEFT VENTRICLE PLAX 2D LVIDd:         4.90 cm Diastology LVIDs:         2.60 cm LV e' medial:    6.53 cm/s LV PW:         1.20 cm LV E/e' medial:  18.4 LV IVS:        1.40 cm LV e' lateral:   10.90 cm/s                        LV E/e' lateral: 11.0  RIGHT VENTRICLE RV Basal diam:  2.90 cm LEFT ATRIUM             Index        RIGHT ATRIUM           Index LA diam:        5.10 cm 2.32 cm/m   RA Area:     13.20 cm LA Vol (A2C):   59.1 ml 26.92 ml/m  RA Volume:   25.60 ml  11.66 ml/m LA Vol (A4C):   51.3 ml 23.36 ml/m LA Biplane Vol: 56.6 ml 25.78 ml/m  AORTIC  VALVE AV Vmax:      221.00 cm/s AV Vmean:     152.000 cm/s AV VTI:       0.462 m AV Peak Grad: 19.5 mmHg AV Mean Grad: 11.0 mmHg  AORTA Ao Root diam: 3.20 cm Ao Asc diam:  4.20 cm MITRAL VALVE                TRICUSPID VALVE MV Area (PHT): 2.48 cm     TR Peak grad:   27.5 mmHg MV Decel Time: 306 msec     TR Vmax:        262.00 cm/s MV E velocity: 120.00 cm/s MV A velocity: 155.00 cm/s MV E/A ratio:  0.77 Lyman Bishop MD Electronically signed by Lyman Bishop MD Signature Date/Time: 06/25/2021/4:09:48 PM    Final    CT HEAD CODE STROKE WO CONTRAST  Result Date: 06/24/2021 CLINICAL DATA:  Code stroke.  Aphasia.  Facial droop EXAM: CT HEAD WITHOUT CONTRAST TECHNIQUE: Contiguous axial images were obtained from the base of the skull through the vertex without intravenous contrast. RADIATION DOSE REDUCTION: This exam was performed according to the departmental dose-optimization program which includes automated exposure control, adjustment of the mA and/or kV according to patient size and/or use of iterative reconstruction technique. COMPARISON:  12/27/2020 CT head FINDINGS: Brain: No evidence of acute infarction, hemorrhage, cerebral edema, mass, mass effect, or midline shift. Ventricles and sulci are normal for age. No extra-axial fluid collection. Remote infarct in the left cerebellum. Vascular: No hyperdense vessel. Skull: Negative for fracture or focal lesion. Sinuses/Orbits: Mucosal thickening in the left-greater-than-right maxillary sinus and anterior ethmoid air cells. Other: The mastoid air cells are well aerated. ASPECTS [facility] Stroke Program Early CT Score) - Ganglionic level infarction (caudate, lentiform nuclei, internal capsule, insula, M1-M3 cortex): 7 - Supraganglionic infarction (M4-M6 cortex): 3 Total score (0-10 with 10 being normal): 10 IMPRESSION: 1. No acute intracranial process.  2. ASPECTS is 10 Code stroke imaging results were communicated on 06/24/2021 at 7:51 pm to provider Rancour via  telephone, who verbally acknowledged these results. Electronically Signed   By: Merilyn Baba M.D.   On: 06/24/2021 19:51     PHYSICAL EXAM  Temp:  [97.9 F (36.6 C)-98.7 F (37.1 C)] 98.7 F (37.1 C) (05/28 0802) Pulse Rate:  [57-62] 57 (05/28 0802) Resp:  [18] 18 (05/28 0802) BP: (156-165)/(63-75) 165/75 (05/28 0802) SpO2:  [95 %-97 %] 95 % (05/28 0802)  General - Well nourished, well developed, in no apparent distress.  Ophthalmologic - fundi not visualized due to noncooperation.  Cardiovascular - Regular rhythm and rate.  Mental Status -  Level of arousal and orientation to time, place, and person were intact. Language nonfluent with word finding difficulty and hesitancy of speech, partial expressive aphasia, naming 4/4 with some hesitancy, but able to repeat and read, follows all simple commands. Fund of Knowledge was assessed and was intact.  Cranial Nerves II - XII - II - Visual field intact OU. III, IV, VI - Extraocular movements intact. V - Facial sensation intact bilaterally. VII - Facial movement intact bilaterally. VIII - Hearing & vestibular intact bilaterally. X - Palate elevates symmetrically. XI - Chin turning & shoulder shrug intact bilaterally. XII - Tongue protrusion intact.  Motor Strength - The patient's strength was normal in all extremities and pronator drift was absent.  Bulk was normal and fasciculations were absent.   Motor Tone - Muscle tone was assessed at the neck and appendages and was normal.  Reflexes - The patient's reflexes were symmetrical in all extremities and he had no pathological reflexes.  Sensory - Light touch, temperature/pinprick were assessed and were symmetrical.    Coordination - The patient had normal movements in the hands and feet with no ataxia or dysmetria.  Tremor was absent.  Gait and Station - deferred.   ASSESSMENT/PLAN Mr. ISMAIL GRAZIANI is a 76 y.o. male with history of hypertension, hyperlipidemia, diabetes,  obesity, OSA, mechanical valve on Coumadin, CAD status post CABG admitted for aphasia. No tPA given due to on Coumadin with therapeutic INR.    Stroke:  left MCA patchy infarct embolic pattern likely secondary to mechanical valve although on Coumadin with therapeutic INR CT no acute abnormality CT head and neck bilateral ICA siphon and bulb atherosclerosis, right more than left MRI left MCA patchy small infarcts 2D Echo EF > 75%, normal function of mechanical valve LDL 65 HgbA1c 8.3 Coumadin for VTE prophylaxis warfarin daily prior to admission, now on warfarin daily with lovenox bridge.  Recommend to increase INR goal to 2.5-3.5.  Patient counseled to be compliant with his antithrombotic medications Ongoing aggressive stroke risk factor management Therapy recommendations: None Disposition: Pending  Mechanical heart valve On Coumadin PTA INR was 2.7 on presentation, therapeutic Given embolic stroke with Coumadin, recommend to increase INR goal from 2-3 to 2.5-3.5.  Diabetes HgbA1c 8.3 goal < 7.0 uncontrolled CBG monitoring SSI DM education and close PCP follow up  Hypertension Stable Long term BP goal normotensive  Hyperlipidemia Home meds: Tricor 145 and Crestor 40 LDL 65, goal < 70 Now on home meds with Tricor and Crestor 40 Continue statin at discharge  Other Stroke Risk Factors Advanced age Obesity, Body mass index is 32.33 kg/m.  Obstructive sleep apnea, on CPAP at home  Other Active Problems Creatinine 1.35, CKD 3  Hospital day # 1  Neurology will sign off. Please call with questions. Pt will  follow up with Dr. Posey Pronto at [facility]  in about 4 weeks. Thanks for the consult.  Rosalin Hawking, MD PhD Stroke Neurology 06/26/2021 11:28 AM    To contact Stroke Continuity provider, please refer to [email or web address] After hours, contact General Neurology

## 2021-06-26 NOTE — TOC Transition Note (Addendum)
Transition of Care [facility]) - CM/SW Discharge Note   Patient Details  Name: [name] MRN: [id number] Date of Birth: August 16, 1945  [facility] of [facility] CM/SW Contact:  Carles Collet, RN Phone Number: 06/26/2021, 9:18 AM   Clinical Narrative:   Spoke to patient at bedside. He is in agreement for [facility] services. [place] able to start with RN in 24-48 hours, and therapies thereafter.  No DME needs, patient states that his daughter will pick him up later today.   Spoke w pharmacy and lovenox is $60 and is in stock. Pharmacy closes at 6pm   Final next level of care: [place] Barriers to Discharge: No Barriers Identified   Patient Goals and CMS Choice Patient states their goals for this hospitalization and ongoing recovery are:: to go home CMS Medicare.gov Compare Post Acute Care list provided to:: Patient Choice offered to / list presented to : Patient  Discharge Placement                       Discharge Plan and Services                          HH Arranged: RN, PT, OT, Speech Therapy HH Agency: [place] Date [place]: 06/26/21 Time [place]: [phone] Representative spoke with at [place]: Angie  Social Determinants of Health ([place]) Interventions     Readmission Risk Interventions     View : No data to display.

## 2021-06-28 ENCOUNTER — Telehealth: Payer: Self-pay | Admitting: Cardiology

## 2021-06-28 ENCOUNTER — Encounter: Payer: Self-pay | Admitting: Internal Medicine

## 2021-06-28 ENCOUNTER — Telehealth: Payer: Self-pay

## 2021-06-28 DIAGNOSIS — R0683 Snoring: Secondary | ICD-10-CM | POA: Diagnosis not present

## 2021-06-28 DIAGNOSIS — I10 Essential (primary) hypertension: Secondary | ICD-10-CM | POA: Diagnosis not present

## 2021-06-28 DIAGNOSIS — G4733 Obstructive sleep apnea (adult) (pediatric): Secondary | ICD-10-CM | POA: Diagnosis not present

## 2021-06-28 DIAGNOSIS — R0681 Apnea, not elsewhere classified: Secondary | ICD-10-CM | POA: Diagnosis not present

## 2021-06-28 NOTE — Telephone Encounter (Signed)
Transition Care Management Unsuccessful Follow-up Telephone Call  Date of discharge and from where:  [place] 06-26-21 Dx: acute cva   Attempts:  1st Attempt  Reason for unsuccessful TCM follow-up call:  Unable to leave message  Transition Care Management Follow-up Telephone Call Date of discharge and from where: [place] 06-26-21 Dx: acute cva How have you been since you were released from the hospital? Doing ok  Any questions or concerns? No  Items Reviewed: Did the pt receive and understand the discharge instructions provided? Yes  Medications obtained and verified? Yes  Other? No  Any new allergies since your discharge? No  Dietary orders reviewed? Yes Do you have support at home? Yes   Home Care and Equipment/Supplies: Were home health services ordered? Yes- RN- [facility] If so, what is the name of the agency? Jay set up a time to come to the patient's home? yes Were any new equipment or medical supplies ordered?  No What is the name of the medical supply agency? na Were you able to get the supplies/equipment? not applicable Do you have any questions related to the use of the equipment or supplies? No  Functional Questionnaire: (I = Independent and D = Dependent) ADLs: I  Bathing/Dressing- I  Meal Prep- I  Eating- I  Maintaining continence- I  Transferring/Ambulation- I  Managing Meds- I  Follow up appointments reviewed:  PCP Hospital f/u appt confirmed? Yes  Scheduled to see [name] NP on 07-06-21 @ [facility] f/u appt confirmed? Yes  Scheduled to see Dr Eulas Post on 07-12-21 @ 1030am.and Dr Shawn Route 07-26-21 at 1130am Are transportation arrangements needed? No  If their condition worsens, is the pt aware to call PCP or go to the Emergency Dept.? Yes Was the patient provided with contact information for the PCP's office or ED? Yes Was to pt encouraged to call back with questions or concerns? Yes

## 2021-06-28 NOTE — Telephone Encounter (Signed)
Agree with sooner follow up for INR check due to recent stroke/currently on Lovenox with higher INR goal of 2.5-3.5.

## 2021-06-28 NOTE — Telephone Encounter (Signed)
Routed to pharmacy team 

## 2021-06-28 NOTE — Telephone Encounter (Signed)
Pt c/o medication issue:  1. Name of Medication:  Coumadin  2. How are you currently taking this medication (dosage and times per day)?   3. Are you having a reaction (difficulty breathing--STAT)?   4. What is your medication issue?   Patient's wife called and rescheduled 6/12 Coumadin for 6/01. Patient had a stroke on 5/26 and is back home now, but Coumadin level was increased and patient was put on Lovenox.

## 2021-06-29 ENCOUNTER — Other Ambulatory Visit: Payer: Self-pay | Admitting: Family

## 2021-06-29 ENCOUNTER — Ambulatory Visit: Payer: Medicare HMO | Admitting: Family

## 2021-06-29 DIAGNOSIS — N183 Chronic kidney disease, stage 3 unspecified: Secondary | ICD-10-CM | POA: Diagnosis not present

## 2021-06-29 DIAGNOSIS — E1142 Type 2 diabetes mellitus with diabetic polyneuropathy: Secondary | ICD-10-CM | POA: Diagnosis not present

## 2021-06-29 DIAGNOSIS — N179 Acute kidney failure, unspecified: Secondary | ICD-10-CM | POA: Diagnosis not present

## 2021-06-29 DIAGNOSIS — E1122 Type 2 diabetes mellitus with diabetic chronic kidney disease: Secondary | ICD-10-CM | POA: Diagnosis not present

## 2021-06-29 DIAGNOSIS — A000 Cholera due to Vibrio cholerae 01, biovar cholerae: Secondary | ICD-10-CM | POA: Diagnosis not present

## 2021-06-29 DIAGNOSIS — E669 Obesity, unspecified: Secondary | ICD-10-CM | POA: Diagnosis not present

## 2021-06-29 DIAGNOSIS — G4733 Obstructive sleep apnea (adult) (pediatric): Secondary | ICD-10-CM | POA: Diagnosis not present

## 2021-06-29 DIAGNOSIS — M1030 Gout due to renal impairment, unspecified site: Secondary | ICD-10-CM | POA: Diagnosis not present

## 2021-06-29 DIAGNOSIS — Z952 Presence of prosthetic heart valve: Secondary | ICD-10-CM | POA: Diagnosis not present

## 2021-06-29 DIAGNOSIS — Z9181 History of falling: Secondary | ICD-10-CM | POA: Diagnosis not present

## 2021-06-29 DIAGNOSIS — Z794 Long term (current) use of insulin: Secondary | ICD-10-CM | POA: Diagnosis not present

## 2021-06-29 DIAGNOSIS — F17200 Nicotine dependence, unspecified, uncomplicated: Secondary | ICD-10-CM | POA: Diagnosis not present

## 2021-06-29 DIAGNOSIS — I69320 Aphasia following cerebral infarction: Secondary | ICD-10-CM | POA: Diagnosis not present

## 2021-06-29 DIAGNOSIS — Z7901 Long term (current) use of anticoagulants: Secondary | ICD-10-CM | POA: Diagnosis not present

## 2021-06-29 DIAGNOSIS — I69392 Facial weakness following cerebral infarction: Secondary | ICD-10-CM | POA: Diagnosis not present

## 2021-06-29 DIAGNOSIS — E1165 Type 2 diabetes mellitus with hyperglycemia: Secondary | ICD-10-CM | POA: Diagnosis not present

## 2021-06-29 DIAGNOSIS — Z951 Presence of aortocoronary bypass graft: Secondary | ICD-10-CM | POA: Diagnosis not present

## 2021-06-29 DIAGNOSIS — I2510 Atherosclerotic heart disease of native coronary artery without angina pectoris: Secondary | ICD-10-CM | POA: Diagnosis not present

## 2021-06-29 DIAGNOSIS — I129 Hypertensive chronic kidney disease with stage 1 through stage 4 chronic kidney disease, or unspecified chronic kidney disease: Secondary | ICD-10-CM | POA: Diagnosis not present

## 2021-06-29 DIAGNOSIS — M48062 Spinal stenosis, lumbar region with neurogenic claudication: Secondary | ICD-10-CM | POA: Diagnosis not present

## 2021-06-29 DIAGNOSIS — I152 Hypertension secondary to endocrine disorders: Secondary | ICD-10-CM

## 2021-06-29 DIAGNOSIS — Z6832 Body mass index (BMI) 32.0-32.9, adult: Secondary | ICD-10-CM | POA: Diagnosis not present

## 2021-06-30 ENCOUNTER — Ambulatory Visit (INDEPENDENT_AMBULATORY_CARE_PROVIDER_SITE_OTHER): Payer: Medicare HMO

## 2021-06-30 DIAGNOSIS — Z7901 Long term (current) use of anticoagulants: Secondary | ICD-10-CM | POA: Diagnosis not present

## 2021-06-30 DIAGNOSIS — Z952 Presence of prosthetic heart valve: Secondary | ICD-10-CM | POA: Diagnosis not present

## 2021-06-30 LAB — POCT INR: INR: 1.9 — AB (ref 2.0–3.0)

## 2021-06-30 NOTE — Patient Instructions (Addendum)
TAKE 2 TABLETS TODAY ONLY and then Increase to 1 tablet Daily, except 0.5  Wednesday.  INR 2 weeks. Continue Lovenox until 6/2 evening

## 2021-07-04 ENCOUNTER — Telehealth: Payer: Self-pay | Admitting: Family

## 2021-07-04 NOTE — Telephone Encounter (Signed)
Caller/Agency: [facility] Number: [phone] Requesting OT/PT/Skilled Nursing/Social Work/Speech Therapy: nursing/ stroke management Frequency: 2x for 2 weeks, 1x for 2 weeks.

## 2021-07-05 DIAGNOSIS — G4733 Obstructive sleep apnea (adult) (pediatric): Secondary | ICD-10-CM | POA: Diagnosis not present

## 2021-07-05 DIAGNOSIS — M1030 Gout due to renal impairment, unspecified site: Secondary | ICD-10-CM | POA: Diagnosis not present

## 2021-07-05 DIAGNOSIS — I69320 Aphasia following cerebral infarction: Secondary | ICD-10-CM | POA: Diagnosis not present

## 2021-07-05 DIAGNOSIS — I129 Hypertensive chronic kidney disease with stage 1 through stage 4 chronic kidney disease, or unspecified chronic kidney disease: Secondary | ICD-10-CM | POA: Diagnosis not present

## 2021-07-05 DIAGNOSIS — Z6832 Body mass index (BMI) 32.0-32.9, adult: Secondary | ICD-10-CM | POA: Diagnosis not present

## 2021-07-05 DIAGNOSIS — E1142 Type 2 diabetes mellitus with diabetic polyneuropathy: Secondary | ICD-10-CM | POA: Diagnosis not present

## 2021-07-05 DIAGNOSIS — Z952 Presence of prosthetic heart valve: Secondary | ICD-10-CM | POA: Diagnosis not present

## 2021-07-05 DIAGNOSIS — N179 Acute kidney failure, unspecified: Secondary | ICD-10-CM | POA: Diagnosis not present

## 2021-07-05 DIAGNOSIS — I2510 Atherosclerotic heart disease of native coronary artery without angina pectoris: Secondary | ICD-10-CM | POA: Diagnosis not present

## 2021-07-05 DIAGNOSIS — I69392 Facial weakness following cerebral infarction: Secondary | ICD-10-CM | POA: Diagnosis not present

## 2021-07-05 DIAGNOSIS — E1165 Type 2 diabetes mellitus with hyperglycemia: Secondary | ICD-10-CM | POA: Diagnosis not present

## 2021-07-05 DIAGNOSIS — Z951 Presence of aortocoronary bypass graft: Secondary | ICD-10-CM | POA: Diagnosis not present

## 2021-07-05 DIAGNOSIS — Z9181 History of falling: Secondary | ICD-10-CM | POA: Diagnosis not present

## 2021-07-05 DIAGNOSIS — F17200 Nicotine dependence, unspecified, uncomplicated: Secondary | ICD-10-CM | POA: Diagnosis not present

## 2021-07-05 DIAGNOSIS — Z7901 Long term (current) use of anticoagulants: Secondary | ICD-10-CM | POA: Diagnosis not present

## 2021-07-05 DIAGNOSIS — N183 Chronic kidney disease, stage 3 unspecified: Secondary | ICD-10-CM | POA: Diagnosis not present

## 2021-07-05 DIAGNOSIS — E669 Obesity, unspecified: Secondary | ICD-10-CM | POA: Diagnosis not present

## 2021-07-05 DIAGNOSIS — Z794 Long term (current) use of insulin: Secondary | ICD-10-CM | POA: Diagnosis not present

## 2021-07-05 DIAGNOSIS — E1122 Type 2 diabetes mellitus with diabetic chronic kidney disease: Secondary | ICD-10-CM | POA: Diagnosis not present

## 2021-07-05 DIAGNOSIS — M48062 Spinal stenosis, lumbar region with neurogenic claudication: Secondary | ICD-10-CM | POA: Diagnosis not present

## 2021-07-06 ENCOUNTER — Telehealth: Payer: Self-pay | Admitting: Family

## 2021-07-06 ENCOUNTER — Ambulatory Visit (INDEPENDENT_AMBULATORY_CARE_PROVIDER_SITE_OTHER): Payer: Medicare HMO | Admitting: Family

## 2021-07-06 VITALS — BP 144/63 | HR 65 | Temp 97.9°F | Resp 16 | Ht 69.0 in | Wt 217.8 lb

## 2021-07-06 DIAGNOSIS — M48062 Spinal stenosis, lumbar region with neurogenic claudication: Secondary | ICD-10-CM | POA: Diagnosis not present

## 2021-07-06 DIAGNOSIS — E1165 Type 2 diabetes mellitus with hyperglycemia: Secondary | ICD-10-CM | POA: Diagnosis not present

## 2021-07-06 DIAGNOSIS — M1030 Gout due to renal impairment, unspecified site: Secondary | ICD-10-CM | POA: Diagnosis not present

## 2021-07-06 DIAGNOSIS — I69320 Aphasia following cerebral infarction: Secondary | ICD-10-CM | POA: Diagnosis not present

## 2021-07-06 DIAGNOSIS — I69328 Other speech and language deficits following cerebral infarction: Secondary | ICD-10-CM | POA: Diagnosis not present

## 2021-07-06 DIAGNOSIS — N179 Acute kidney failure, unspecified: Secondary | ICD-10-CM | POA: Diagnosis not present

## 2021-07-06 DIAGNOSIS — I10 Essential (primary) hypertension: Secondary | ICD-10-CM

## 2021-07-06 DIAGNOSIS — Z952 Presence of prosthetic heart valve: Secondary | ICD-10-CM | POA: Diagnosis not present

## 2021-07-06 DIAGNOSIS — Z794 Long term (current) use of insulin: Secondary | ICD-10-CM | POA: Diagnosis not present

## 2021-07-06 DIAGNOSIS — E1122 Type 2 diabetes mellitus with diabetic chronic kidney disease: Secondary | ICD-10-CM | POA: Diagnosis not present

## 2021-07-06 DIAGNOSIS — E669 Obesity, unspecified: Secondary | ICD-10-CM | POA: Diagnosis not present

## 2021-07-06 DIAGNOSIS — Z6832 Body mass index (BMI) 32.0-32.9, adult: Secondary | ICD-10-CM | POA: Diagnosis not present

## 2021-07-06 DIAGNOSIS — I63412 Cerebral infarction due to embolism of left middle cerebral artery: Secondary | ICD-10-CM

## 2021-07-06 DIAGNOSIS — I129 Hypertensive chronic kidney disease with stage 1 through stage 4 chronic kidney disease, or unspecified chronic kidney disease: Secondary | ICD-10-CM | POA: Diagnosis not present

## 2021-07-06 DIAGNOSIS — E1142 Type 2 diabetes mellitus with diabetic polyneuropathy: Secondary | ICD-10-CM | POA: Diagnosis not present

## 2021-07-06 DIAGNOSIS — Z7901 Long term (current) use of anticoagulants: Secondary | ICD-10-CM | POA: Diagnosis not present

## 2021-07-06 DIAGNOSIS — G4733 Obstructive sleep apnea (adult) (pediatric): Secondary | ICD-10-CM | POA: Diagnosis not present

## 2021-07-06 DIAGNOSIS — I69392 Facial weakness following cerebral infarction: Secondary | ICD-10-CM | POA: Diagnosis not present

## 2021-07-06 DIAGNOSIS — E785 Hyperlipidemia, unspecified: Secondary | ICD-10-CM | POA: Diagnosis not present

## 2021-07-06 DIAGNOSIS — F17200 Nicotine dependence, unspecified, uncomplicated: Secondary | ICD-10-CM | POA: Diagnosis not present

## 2021-07-06 DIAGNOSIS — I2510 Atherosclerotic heart disease of native coronary artery without angina pectoris: Secondary | ICD-10-CM | POA: Diagnosis not present

## 2021-07-06 DIAGNOSIS — Z951 Presence of aortocoronary bypass graft: Secondary | ICD-10-CM | POA: Diagnosis not present

## 2021-07-06 DIAGNOSIS — R4701 Aphasia: Secondary | ICD-10-CM | POA: Diagnosis not present

## 2021-07-06 DIAGNOSIS — N183 Chronic kidney disease, stage 3 unspecified: Secondary | ICD-10-CM | POA: Diagnosis not present

## 2021-07-06 DIAGNOSIS — Z9181 History of falling: Secondary | ICD-10-CM | POA: Diagnosis not present

## 2021-07-06 NOTE — Assessment & Plan Note (Signed)
Slowly improving. He will be working with home health speech therapy.

## 2021-07-06 NOTE — Telephone Encounter (Signed)
Please call [facility] and request a copy of his DM eye exam.

## 2021-07-06 NOTE — Assessment & Plan Note (Signed)
Lab Results  Component Value Date   HGBA1C 8.3 (H) 06/25/2021   Will see if we can get him back in sooner with endocrinology for sooner glycemic control evaluation. He is currently scheduled for follow up in the end of September.

## 2021-07-06 NOTE — Assessment & Plan Note (Signed)
>>  ASSESSMENT AND PLAN FOR EMBOLIC STROKE INVOLVING LEFT MIDDLE CEREBRAL ARTERY  WRITTEN ON 07/06/2021  1:38 PM BY O'SULLIVAN, [name], NP  Felt to be thromboembolic in the setting of mechanical heart valve.  New INR goal per neurology is 2.5-3.5.  This is being managed by the coumadin clinic. He has follow up scheduled with Neurology.

## 2021-07-06 NOTE — Telephone Encounter (Signed)
Spoke to [place] at [facility] and verbal orders given to approved services as stated by pcp

## 2021-07-06 NOTE — Assessment & Plan Note (Signed)
BP looks good. Avoid hypotension in the setting of recent CVA. Continue toprol xl '25mg'$ . Once daily.

## 2021-07-06 NOTE — Assessment & Plan Note (Signed)
He is liking the nasal cpap much better that the full face mask.

## 2021-07-06 NOTE — Telephone Encounter (Signed)
Records request faxed 

## 2021-07-06 NOTE — Progress Notes (Signed)
Subjective:     Patient ID: [name], male    DOB: Aug 24, 1945, 76 y.o.   MRN: [id number]  Chief Complaint  Patient presents with   Follow-up    Here for Hp Follow Up     HPI Patient is in today for hospital follow up.  Pt was admitted 06/24/21 due to expressive aphasia.  His inr at that time was 2.7.  Studies performed during his hospitalization included the following:  Significant studies: 5/26>> CT head: No acute intracranial process. 5/26>> CTA head: No intracranial large vessel occlusion, moderate to severe narrowing of right cavernous carotid, moderate narrowing in the right supraclinoid carotid, left cavernous carotid and distal left V4. 5/26>> CTA neck: No hemodynamically significant stenosis in the neck 5/27 MR Brain:    IMPRESSION: 1. Small to moderate left MCA branch infarct affecting left insular and frontal cortex. 2. Small chronic cerebellar infarcts. Mild chronic microvascular ischemia and cerebral volume loss for age.   5/27>> LDL: 65 5/28>> A1c 8.3. 5/28>> EF> 75%, echo findings consistent with normal structure and function of the aortic valve prosthesis.  It was felt that his stroke was likely embolic in nature and the neurologist recommended a new INR target of 2.5 to 3.5.   HTN-  BP Readings from Last 3 Encounters:  07/06/21 (!) 144/63  06/26/21 (!) 165/75  06/01/21 118/64   DM2-  Lab Results  Component Value Date   HGBA1C 8.3 (H) 06/25/2021   HGBA1C 8.0 (A) 04/26/2021   HGBA1C 9.2 (A) 12/21/2020   Lab Results  Component Value Date   MICROALBUR <0.7 06/02/2020   LDLCALC 65 06/25/2021   CREATININE 1.51 (H) 06/26/2021   Using nasal  CPAP and reports that he is sleeping better.    Wife reports that he continues to have issues with word finding and is napping more, but overall he continues to improve following his discharge.  Health Maintenance Due  Topic Date Due   Zoster Vaccines- Shingrix (1 of 2) Never done   OPHTHALMOLOGY EXAM   03/25/2021   URINE MICROALBUMIN  06/02/2021   He has home health PT/OT/Speech and RN.    Past Medical History:  Diagnosis Date   Allergy    Aortic stenosis    Arthritis    CAD (coronary artery disease)    cabg   Cancer ([place]) 01/30/2009   prostate, skin   Chronic kidney disease    Complication of anesthesia    Erectile dysfunction    Heart murmur    Hemochromatosis 02/14/2011   Hemorrhoids    History of gout    History of hepatitis 01/31/1972   Hyperlipidemia    Hypertension    Hypertr obst cardiomyop    Mechanical heart valve present    Manufacturer: [employer]le[employer] #: [id number]  Model #: [id number]. Card states MRI compatible with 3 teslas or less.   Obesity    moderate   PONV (postoperative nausea and vomiting)    after CABG- slow to wake up   Sensorineural hearing loss    Sleep apnea    Stroke [facility])    Old stroke - patient was not aware.   Type II or unspecified type diabetes mellitus without mention of complication, not stated as uncontrolled     Past Surgical History:  Procedure Laterality Date   AORTIC VALVE REPLACEMENT  11/14/2003   [facility]   APPENDECTOMY  1990   CHOLECYSTECTOMY  1990   CORONARY ARTERY BYPASS GRAFT  10/2003  HERNIA REPAIR  1999   right, inguinal   HERNIA REPAIR  2002   left, inguinal   HIP SURGERY  2006   right hip   LUMBAR LAMINECTOMY/DECOMPRESSION MICRODISCECTOMY Bilateral 02/04/2021   Procedure: Bilateral Lumbar Two-Three Laminectomy;  Surgeon: Kristeen Miss, MD;  Location: [facility];  Service: Neurosurgery;  Laterality: Bilateral;  3C/RM 68   PILONIDAL CYST EXCISION  1964   prostate seed implant  3/12   TONSILLECTOMY  childhood    Family History  Problem Relation Age of Onset   Heart disease Mother    Stroke Mother    Heart disease Father    Asperger's syndrome Son    Hyperlipidemia Son    Coronary artery disease Brother    Cancer Neg Hx        negative for colon cancer    Social History   Socioeconomic History    Marital status: Married    Spouse name: Not on file   Number of children: Not on file   Years of education: Not on file   Highest education level: Not on file  Occupational History   Not on file  Tobacco Use   Smoking status: Light Smoker    Types: Cigars   Smokeless tobacco: Never   Tobacco comments:    Pt states he smokes 1 cigar about once a month. 06/01/2021  Substance and Sexual Activity   Alcohol use: Not Currently    Comment: wine maybe 1 per month   Drug use: No   Sexual activity: Never  Other Topics Concern   Not on file  Social History Narrative   Right Handed    Lives in a one sotyr home. One step leads to front door.    Social Determinants of Health   Financial Resource Strain: Not on file  Food Insecurity: Not on file  Transportation Needs: Not on file  Physical Activity: Not on file  Stress: Not on file  Social Connections: Not on file  Intimate Partner Violence: Not on file    Outpatient Medications Prior to Visit  Medication Sig Dispense Refill   AMBULATORY NON FORMULARY MEDICATION cpap cushions            AirFit F20 (Size: Large)           AirFit F30 (Size: Med)   Please FAX the prescription to:  [phone] 12 each prn   betamethasone dipropionate (DIPROLENE) 0.05 % cream Apply topically 2 (two) times daily as needed. To eczema rash 30 g 1   Blood Glucose Monitoring Suppl Supplies MISC Use for monitoring glucose level 100 each 1   colchicine 0.6 MG tablet TAKE 2 TABS BY MOUTH NOW AND THEN 1 TAB IN 1 HOUR FOR GOUT. (MAX 3 TABS/24 HRS) (Patient taking differently: Take 0.6 mg by mouth daily as needed (gout).) 9 tablet 1   Continuous Blood Gluc Sensor (DEXCOM G6 SENSOR) MISC 1 Device by Does not apply route as directed. 9 each 3   Continuous Blood Gluc Transmit (DEXCOM G6 TRANSMITTER) MISC 1 Device by Does not apply route as directed. 1 each 3   dapagliflozin propanediol (FARXIGA) 10 MG TABS tablet Take 1 tablet (10 mg total) by mouth daily before  breakfast. 90 tablet 3   fenofibrate (TRICOR) 145 MG tablet TAKE 1 TABLET BY MOUTH EVERY DAY 90 tablet 0   furosemide (LASIX) 20 MG tablet TAKE 1 TABLET BY MOUTH EVERY DAY AS NEEDED FOR SWELLING 90 tablet 0   glucose blood (ONETOUCH VERIO) test strip  Use as instructed 100 strip 12   insulin aspart (NOVOLOG FLEXPEN) 100 UNIT/ML FlexPen Max daily 70 units (Patient taking differently: Using sliding scale, pts spouse unsure of dosage) 75 mL 3   Insulin Glargine (BASAGLAR KWIKPEN) 100 UNIT/ML Inject 24 Units into the skin daily. 30 mL 3   Insulin Pen Needle 32G X 4 MM MISC 1 Device by Does not apply route in the morning, at noon, in the evening, and at bedtime. 400 each 3   metoprolol succinate (TOPROL-XL) 25 MG 24 hr tablet TAKE 1 TABLET BY MOUTH EVERY DAY 90 tablet 1   OneTouch Delica Lancets 19F MISC USE AS DIRECTED 100 each 1   tacrolimus (PROTOPIC) 0.1 % ointment Apply 1 application topically daily as needed (irritation).     warfarin (COUMADIN) 7.5 MG tablet Take up to 1 tablet by mouth daily as directed by coumadin clinic (Patient taking differently: Take 3.75-7.5 mg by mouth See admin instructions. Taking 7.5 mg on Sun, Tues, Thur, Saturday, on Mon, Wed ,Friday taking 1\2 tab ( 3.75 mg) daily.) 90 tablet 1   enoxaparin (LOVENOX) 100 MG/ML injection Inject 1 mL (100 mg total) into the skin every 12 (twelve) hours for 3 days. 6 mL 0   rosuvastatin (CRESTOR) 40 MG tablet Take 1 tablet (40 mg total) by mouth daily. (Patient taking differently: Take 40 mg by mouth every evening.) 90 tablet 3   No facility-administered medications prior to visit.    No Known Allergies  ROS See HPI    Objective:    Physical Exam Constitutional:      General: He is not in acute distress.    Appearance: He is well-developed.  HENT:     Head: Normocephalic and atraumatic.  Cardiovascular:     Rate and Rhythm: Normal rate and regular rhythm.     Heart sounds: No murmur heard.    Comments: + click from  mechanical aortic valve noted.  Pulmonary:     Effort: Pulmonary effort is normal. No respiratory distress.     Breath sounds: Normal breath sounds. No wheezing or rales.  Musculoskeletal:     Comments: 2+ RLE edema, 2-3+ LLE edema  Skin:    General: Skin is warm and dry.  Neurological:     Mental Status: He is alert and oriented to person, place, and time.     Cranial Nerves: Cranial nerves 2-12 are intact.     Comments: Speech is clear but slowed at times with some difficulty word finding  Psychiatric:        Behavior: Behavior normal.        Thought Content: Thought content normal.    BP (!) 144/63 (BP Location: Right Arm, Patient Position: Sitting, Cuff Size: Normal)   Pulse 65   Temp 97.9 F (36.6 C) (Oral)   Resp 16   Ht '5\' 9"'$  (1.753 m)   Wt 217 lb 12.8 oz (98.8 kg)   SpO2 95%   BMI 32.16 kg/m  Wt Readings from Last 3 Encounters:  07/06/21 217 lb 12.8 oz (98.8 kg)  06/24/21 225 lb 5 oz (102.2 kg)  06/01/21 221 lb 9.6 oz (100.5 kg)       Assessment & Plan:   Problem List Items Addressed This Visit       Unprioritized   Essential hypertension (Chronic)    BP looks good. Avoid hypotension in the setting of recent CVA. Continue toprol xl '25mg'$ . Once daily.        Dyslipidemia (Chronic)  Lab Results  Component Value Date   CHOL 143 06/25/2021   HDL 30 (L) 06/25/2021   LDLCALC 65 06/25/2021   LDLDIRECT 86.0 09/09/2020   TRIG 240 (H) 06/25/2021   CHOLHDL 4.8 06/25/2021  LDL goal <70. LDL is at goal. Continue crestor.       Uncontrolled type 2 diabetes mellitus with hyperglycemia (HCC)    Lab Results  Component Value Date   HGBA1C 8.3 (H) 06/25/2021  Will see if we can get him back in sooner with endocrinology for sooner glycemic control evaluation. He is currently scheduled for follow up in the end of September.       OSA (obstructive sleep apnea)    He is liking the nasal cpap much better that the full face mask.         Expressive aphasia     Slowly improving. He will be working with home health speech therapy.         Cerebrovascular accident (CVA) due to embolism of left middle cerebral artery ([place]) - Primary    Felt to be thromboembolic in the setting of mechanical heart valve.  New INR goal per neurology is 2.5-3.5.  This is being managed by the coumadin clinic. He has follow up scheduled with Neurology.        I am having David Brill. [place] "Len" maintain his Blood Glucose Monitoring Suppl, betamethasone dipropionate, AMBULATORY NON FORMULARY MEDICATION, Dexcom G6 Transmitter, Dexcom G6 Sensor, OneTouch Verio, OneTouch Delica Lancets 24M, rosuvastatin, tacrolimus, colchicine, furosemide, fenofibrate, Basaglar KwikPen, NovoLOG FlexPen, dapagliflozin propanediol, Insulin Pen Needle, warfarin, enoxaparin, and metoprolol succinate.  No orders of the defined types were placed in this encounter.

## 2021-07-06 NOTE — Assessment & Plan Note (Signed)
Lab Results  Component Value Date   CHOL 143 06/25/2021   HDL 30 (L) 06/25/2021   LDLCALC 65 06/25/2021   LDLDIRECT 86.0 09/09/2020   TRIG 240 (H) 06/25/2021   CHOLHDL 4.8 06/25/2021   LDL goal <70. LDL is at goal. Continue crestor.

## 2021-07-06 NOTE — Assessment & Plan Note (Addendum)
Felt to be thromboembolic in the setting of mechanical heart valve.  New INR goal per neurology is 2.5-3.5.  This is being managed by the coumadin clinic. He has follow up scheduled with Neurology.

## 2021-07-07 DIAGNOSIS — M1030 Gout due to renal impairment, unspecified site: Secondary | ICD-10-CM | POA: Diagnosis not present

## 2021-07-07 DIAGNOSIS — Z9181 History of falling: Secondary | ICD-10-CM | POA: Diagnosis not present

## 2021-07-07 DIAGNOSIS — Z952 Presence of prosthetic heart valve: Secondary | ICD-10-CM | POA: Diagnosis not present

## 2021-07-07 DIAGNOSIS — I69392 Facial weakness following cerebral infarction: Secondary | ICD-10-CM | POA: Diagnosis not present

## 2021-07-07 DIAGNOSIS — Z951 Presence of aortocoronary bypass graft: Secondary | ICD-10-CM | POA: Diagnosis not present

## 2021-07-07 DIAGNOSIS — E1142 Type 2 diabetes mellitus with diabetic polyneuropathy: Secondary | ICD-10-CM | POA: Diagnosis not present

## 2021-07-07 DIAGNOSIS — N183 Chronic kidney disease, stage 3 unspecified: Secondary | ICD-10-CM | POA: Diagnosis not present

## 2021-07-07 DIAGNOSIS — E669 Obesity, unspecified: Secondary | ICD-10-CM | POA: Diagnosis not present

## 2021-07-07 DIAGNOSIS — M48062 Spinal stenosis, lumbar region with neurogenic claudication: Secondary | ICD-10-CM | POA: Diagnosis not present

## 2021-07-07 DIAGNOSIS — Z794 Long term (current) use of insulin: Secondary | ICD-10-CM | POA: Diagnosis not present

## 2021-07-07 DIAGNOSIS — F17200 Nicotine dependence, unspecified, uncomplicated: Secondary | ICD-10-CM | POA: Diagnosis not present

## 2021-07-07 DIAGNOSIS — I129 Hypertensive chronic kidney disease with stage 1 through stage 4 chronic kidney disease, or unspecified chronic kidney disease: Secondary | ICD-10-CM | POA: Diagnosis not present

## 2021-07-07 DIAGNOSIS — G4733 Obstructive sleep apnea (adult) (pediatric): Secondary | ICD-10-CM | POA: Diagnosis not present

## 2021-07-07 DIAGNOSIS — Z7901 Long term (current) use of anticoagulants: Secondary | ICD-10-CM | POA: Diagnosis not present

## 2021-07-07 DIAGNOSIS — I69320 Aphasia following cerebral infarction: Secondary | ICD-10-CM | POA: Diagnosis not present

## 2021-07-07 DIAGNOSIS — I2510 Atherosclerotic heart disease of native coronary artery without angina pectoris: Secondary | ICD-10-CM | POA: Diagnosis not present

## 2021-07-07 DIAGNOSIS — E1165 Type 2 diabetes mellitus with hyperglycemia: Secondary | ICD-10-CM | POA: Diagnosis not present

## 2021-07-07 DIAGNOSIS — N179 Acute kidney failure, unspecified: Secondary | ICD-10-CM | POA: Diagnosis not present

## 2021-07-07 DIAGNOSIS — E1122 Type 2 diabetes mellitus with diabetic chronic kidney disease: Secondary | ICD-10-CM | POA: Diagnosis not present

## 2021-07-07 DIAGNOSIS — Z6832 Body mass index (BMI) 32.0-32.9, adult: Secondary | ICD-10-CM | POA: Diagnosis not present

## 2021-07-08 ENCOUNTER — Telehealth: Payer: Self-pay | Admitting: Family

## 2021-07-08 DIAGNOSIS — Z7901 Long term (current) use of anticoagulants: Secondary | ICD-10-CM | POA: Diagnosis not present

## 2021-07-08 DIAGNOSIS — Z951 Presence of aortocoronary bypass graft: Secondary | ICD-10-CM | POA: Diagnosis not present

## 2021-07-08 DIAGNOSIS — G4733 Obstructive sleep apnea (adult) (pediatric): Secondary | ICD-10-CM | POA: Diagnosis not present

## 2021-07-08 DIAGNOSIS — E669 Obesity, unspecified: Secondary | ICD-10-CM | POA: Diagnosis not present

## 2021-07-08 DIAGNOSIS — E1165 Type 2 diabetes mellitus with hyperglycemia: Secondary | ICD-10-CM | POA: Diagnosis not present

## 2021-07-08 DIAGNOSIS — M1030 Gout due to renal impairment, unspecified site: Secondary | ICD-10-CM | POA: Diagnosis not present

## 2021-07-08 DIAGNOSIS — F17200 Nicotine dependence, unspecified, uncomplicated: Secondary | ICD-10-CM | POA: Diagnosis not present

## 2021-07-08 DIAGNOSIS — I2510 Atherosclerotic heart disease of native coronary artery without angina pectoris: Secondary | ICD-10-CM | POA: Diagnosis not present

## 2021-07-08 DIAGNOSIS — Z794 Long term (current) use of insulin: Secondary | ICD-10-CM | POA: Diagnosis not present

## 2021-07-08 DIAGNOSIS — N183 Chronic kidney disease, stage 3 unspecified: Secondary | ICD-10-CM | POA: Diagnosis not present

## 2021-07-08 DIAGNOSIS — I129 Hypertensive chronic kidney disease with stage 1 through stage 4 chronic kidney disease, or unspecified chronic kidney disease: Secondary | ICD-10-CM | POA: Diagnosis not present

## 2021-07-08 DIAGNOSIS — Z952 Presence of prosthetic heart valve: Secondary | ICD-10-CM | POA: Diagnosis not present

## 2021-07-08 DIAGNOSIS — E1142 Type 2 diabetes mellitus with diabetic polyneuropathy: Secondary | ICD-10-CM | POA: Diagnosis not present

## 2021-07-08 DIAGNOSIS — Z6832 Body mass index (BMI) 32.0-32.9, adult: Secondary | ICD-10-CM | POA: Diagnosis not present

## 2021-07-08 DIAGNOSIS — M48062 Spinal stenosis, lumbar region with neurogenic claudication: Secondary | ICD-10-CM | POA: Diagnosis not present

## 2021-07-08 DIAGNOSIS — I69320 Aphasia following cerebral infarction: Secondary | ICD-10-CM | POA: Diagnosis not present

## 2021-07-08 DIAGNOSIS — E1122 Type 2 diabetes mellitus with diabetic chronic kidney disease: Secondary | ICD-10-CM | POA: Diagnosis not present

## 2021-07-08 DIAGNOSIS — I69392 Facial weakness following cerebral infarction: Secondary | ICD-10-CM | POA: Diagnosis not present

## 2021-07-08 DIAGNOSIS — N179 Acute kidney failure, unspecified: Secondary | ICD-10-CM | POA: Diagnosis not present

## 2021-07-08 DIAGNOSIS — Z9181 History of falling: Secondary | ICD-10-CM | POA: Diagnosis not present

## 2021-07-08 NOTE — Telephone Encounter (Signed)
[name]) called stating the pt had a [facility] OT visit on 6.9.23 and doesn't need any additional appts.

## 2021-07-09 ENCOUNTER — Other Ambulatory Visit: Payer: Self-pay | Admitting: Family

## 2021-07-11 DIAGNOSIS — N179 Acute kidney failure, unspecified: Secondary | ICD-10-CM | POA: Diagnosis not present

## 2021-07-11 DIAGNOSIS — Z9181 History of falling: Secondary | ICD-10-CM | POA: Diagnosis not present

## 2021-07-11 DIAGNOSIS — E1122 Type 2 diabetes mellitus with diabetic chronic kidney disease: Secondary | ICD-10-CM | POA: Diagnosis not present

## 2021-07-11 DIAGNOSIS — I129 Hypertensive chronic kidney disease with stage 1 through stage 4 chronic kidney disease, or unspecified chronic kidney disease: Secondary | ICD-10-CM | POA: Diagnosis not present

## 2021-07-11 DIAGNOSIS — E1142 Type 2 diabetes mellitus with diabetic polyneuropathy: Secondary | ICD-10-CM | POA: Diagnosis not present

## 2021-07-11 DIAGNOSIS — G4733 Obstructive sleep apnea (adult) (pediatric): Secondary | ICD-10-CM | POA: Diagnosis not present

## 2021-07-11 DIAGNOSIS — M1030 Gout due to renal impairment, unspecified site: Secondary | ICD-10-CM | POA: Diagnosis not present

## 2021-07-11 DIAGNOSIS — Z794 Long term (current) use of insulin: Secondary | ICD-10-CM | POA: Diagnosis not present

## 2021-07-11 DIAGNOSIS — I2510 Atherosclerotic heart disease of native coronary artery without angina pectoris: Secondary | ICD-10-CM | POA: Diagnosis not present

## 2021-07-11 DIAGNOSIS — I69320 Aphasia following cerebral infarction: Secondary | ICD-10-CM | POA: Diagnosis not present

## 2021-07-11 DIAGNOSIS — M48062 Spinal stenosis, lumbar region with neurogenic claudication: Secondary | ICD-10-CM | POA: Diagnosis not present

## 2021-07-11 DIAGNOSIS — N183 Chronic kidney disease, stage 3 unspecified: Secondary | ICD-10-CM | POA: Diagnosis not present

## 2021-07-11 DIAGNOSIS — Z7901 Long term (current) use of anticoagulants: Secondary | ICD-10-CM | POA: Diagnosis not present

## 2021-07-11 DIAGNOSIS — I69392 Facial weakness following cerebral infarction: Secondary | ICD-10-CM | POA: Diagnosis not present

## 2021-07-11 DIAGNOSIS — Z6832 Body mass index (BMI) 32.0-32.9, adult: Secondary | ICD-10-CM | POA: Diagnosis not present

## 2021-07-11 DIAGNOSIS — E1165 Type 2 diabetes mellitus with hyperglycemia: Secondary | ICD-10-CM | POA: Diagnosis not present

## 2021-07-11 DIAGNOSIS — Z951 Presence of aortocoronary bypass graft: Secondary | ICD-10-CM | POA: Diagnosis not present

## 2021-07-11 DIAGNOSIS — E669 Obesity, unspecified: Secondary | ICD-10-CM | POA: Diagnosis not present

## 2021-07-11 DIAGNOSIS — F17200 Nicotine dependence, unspecified, uncomplicated: Secondary | ICD-10-CM | POA: Diagnosis not present

## 2021-07-11 DIAGNOSIS — Z952 Presence of prosthetic heart valve: Secondary | ICD-10-CM | POA: Diagnosis not present

## 2021-07-12 ENCOUNTER — Ambulatory Visit (INDEPENDENT_AMBULATORY_CARE_PROVIDER_SITE_OTHER): Payer: Medicare HMO | Admitting: *Deleted

## 2021-07-12 ENCOUNTER — Ambulatory Visit: Payer: Medicare HMO | Admitting: Physician Assistant

## 2021-07-12 ENCOUNTER — Encounter: Payer: Self-pay | Admitting: Physician Assistant

## 2021-07-12 VITALS — BP 128/58 | HR 67 | Ht 69.0 in | Wt 214.8 lb

## 2021-07-12 DIAGNOSIS — Z8673 Personal history of transient ischemic attack (TIA), and cerebral infarction without residual deficits: Secondary | ICD-10-CM | POA: Diagnosis not present

## 2021-07-12 DIAGNOSIS — I25810 Atherosclerosis of coronary artery bypass graft(s) without angina pectoris: Secondary | ICD-10-CM

## 2021-07-12 DIAGNOSIS — Z952 Presence of prosthetic heart valve: Secondary | ICD-10-CM

## 2021-07-12 DIAGNOSIS — I10 Essential (primary) hypertension: Secondary | ICD-10-CM

## 2021-07-12 DIAGNOSIS — E119 Type 2 diabetes mellitus without complications: Secondary | ICD-10-CM | POA: Diagnosis not present

## 2021-07-12 DIAGNOSIS — E785 Hyperlipidemia, unspecified: Secondary | ICD-10-CM

## 2021-07-12 DIAGNOSIS — Z5181 Encounter for therapeutic drug level monitoring: Secondary | ICD-10-CM

## 2021-07-12 LAB — POCT INR: INR: 3.5 — AB (ref 2.0–3.0)

## 2021-07-12 MED ORDER — WARFARIN SODIUM 7.5 MG PO TABS: ORAL_TABLET | ORAL | 2 refills | Status: DC

## 2021-07-12 NOTE — Progress Notes (Signed)
Cardiology Office Note:    Date:  07/14/2021   ID:  [name], DOB March 28, 1945, MRN [id number]  PCP:  [name], NP   [facility] Providers Cardiologist:  Kirk Ruths, MD     Referring MD: [name], NP   Chief Complaint  Patient presents with   Follow-up    Seen for Dr. Stanford Breed    History of Present Illness:    [name] is a 76 y.o. male with a hx of CAD s/p CABG (LIMA-LAD, SVG-marginal, sequential SVG-RCA-distal left circumflex) and St Jude aortic valve replacement in 2005, septal myomectomy at the time of AVR, hypertension, hyperlipidemia, DM2, CKD, obstructive sleep apnea and a history of CVA.  Abdominal CT in April 2015 showed no aneurysm.  Myoview in May 2016 showed normal perfusion, normal LV function.  Echocardiogram in September 2022 showed normal LV function, previous septal myomectomy but no significant gradient, grade 2 DD, previous AVR with mean gradient 10 mmHg, dilated ascending aorta measuring at 43 mm.  Carotid Doppler in November 2022 showed no significant stenosis.  Patient was last seen by Dr. Stanford Breed in February 2023 at which time he was doing well.  Patient was recently admitted to the hospital on 06/24/2021 with expressive aphasia.  INR on arrival was 2.7.  CTA showed no intracranial large vessel occlusion, moderate to severe narrowing of the right cavernous carotid, moderate narrowing of the right supraglenoid carotid.  CTA of the neck showed no significant stenosis in the neck.  MRI showed left MCA patchy small infarcts.  Echocardiogram obtained during this admission showed EF greater than 75%, normal functioning of the aortic valve prosthesis.  Patient was seen by stroke team who felt he had an embolic CVA despite therapeutic INR.  Patient was seen by neurology service who recommended new INR target of 2.5-3.5.  On the day of discharge, his INR was 1.9, he was discharged with overlapping Lovenox bridging.  Patient presents today  for follow-up after the recent stroke.  He has no muscle weakness on either side.  He still have significant degree of trouble with word finding.  He has not been seen by speech therapist.  He denies any recent chest pain worsening shortness of breath.  Coumadin clinic is aware of increased INR goal of 2.5-3.5.  I will discuss with MD to see if you want to add a 81 mg aspirin to the anticoagulation therapy to further decrease embolic stroke risk in this case.  Otherwise, he can follow-up with Dr. Stanford Breed in 3 months.   Past Medical History:  Diagnosis Date   Allergy    Aortic stenosis    Arthritis    CAD (coronary artery disease)    cabg   Cancer ([place]) 01/30/2009   prostate, skin   Chronic kidney disease    Complication of anesthesia    Erectile dysfunction    Heart murmur    Hemochromatosis 02/14/2011   Hemorrhoids    History of gout    History of hepatitis 01/31/1972   Hyperlipidemia    Hypertension    Hypertr obst cardiomyop    Mechanical heart valve present    Manufacturer: Sherlean Foot #: [id number]  Model #: [id number]. Card states MRI compatible with 3 teslas or less.   Obesity    moderate   PONV (postoperative nausea and vomiting)    after CABG- slow to wake up   Sensorineural hearing loss    Sleep apnea    Stroke ([place])  Old stroke - patient was not aware.   Type II or unspecified type diabetes mellitus without mention of complication, not stated as uncontrolled     Past Surgical History:  Procedure Laterality Date   AORTIC VALVE REPLACEMENT  11/14/2003   St Jude [facility]   APPENDECTOMY  1990   CHOLECYSTECTOMY  1990   CORONARY ARTERY BYPASS GRAFT  10/2003   HERNIA REPAIR  1999   right, inguinal   HERNIA REPAIR  2002   left, inguinal   HIP SURGERY  2006   right hip   LUMBAR LAMINECTOMY/DECOMPRESSION MICRODISCECTOMY Bilateral 02/04/2021   Procedure: Bilateral Lumbar Two-Three Laminectomy;  Surgeon: Kristeen Miss, MD;  Location: [facility];  Service: Neurosurgery;   Laterality: Bilateral;  3C/RM 20   PILONIDAL CYST EXCISION  1964   prostate seed implant  3/12   TONSILLECTOMY  childhood    Current Medications: Current Meds  Medication Sig   AMBULATORY NON FORMULARY MEDICATION cpap cushions            AirFit F20 (Size: Large)           AirFit F30 (Size: Med)   Please FAX the prescription to:  [phone]   Blood Glucose Monitoring Suppl Supplies MISC Use for monitoring glucose level   Continuous Blood Gluc Sensor (DEXCOM G6 SENSOR) MISC 1 Device by Does not apply route as directed.   Continuous Blood Gluc Transmit (DEXCOM G6 TRANSMITTER) MISC 1 Device by Does not apply route as directed.   dapagliflozin propanediol (FARXIGA) 10 MG TABS tablet Take 1 tablet (10 mg total) by mouth daily before breakfast.   fenofibrate (TRICOR) 145 MG tablet TAKE 1 TABLET BY MOUTH EVERY DAY   furosemide (LASIX) 20 MG tablet TAKE 1 TABLET BY MOUTH EVERY DAY AS NEEDED FOR SWELLING   insulin aspart (NOVOLOG FLEXPEN) 100 UNIT/ML FlexPen Max daily 70 units (Patient taking differently: Using sliding scale, pts spouse unsure of dosage)   Insulin Glargine (BASAGLAR KWIKPEN) 100 UNIT/ML Inject 24 Units into the skin daily.   Insulin Pen Needle 32G X 4 MM MISC 1 Device by Does not apply route in the morning, at noon, in the evening, and at bedtime.   metoprolol succinate (TOPROL-XL) 25 MG 24 hr tablet TAKE 1 TABLET BY MOUTH EVERY DAY   OneTouch Delica Lancets 25E MISC USE AS DIRECTED   rosuvastatin (CRESTOR) 40 MG tablet Take 1 tablet (40 mg total) by mouth daily. (Patient taking differently: Take 40 mg by mouth every evening.)   [DISCONTINUED] warfarin (COUMADIN) 7.5 MG tablet Take up to 1 tablet by mouth daily as directed by coumadin clinic (Patient taking differently: Take 3.75-7.5 mg by mouth See admin instructions. Taking 7.5 mg on Sun, Tues, Thur, Saturday, on Mon, Wed ,Friday taking 1\2 tab ( 3.75 mg) daily.)     Allergies:   Patient has no known allergies.   Social  History   Socioeconomic History   Marital status: Married    Spouse name: Not on file   Number of children: Not on file   Years of education: Not on file   Highest education level: Not on file  Occupational History   Not on file  Tobacco Use   Smoking status: Former    Types: Cigars    Quit date: 06/2021    Years since quitting: 0.0   Smokeless tobacco: Never   Tobacco comments:    Pt states he smokes 1 cigar about once a month. 06/01/2021    Pt quit  Cigars 06/2021  Substance and Sexual Activity   Alcohol use: Not Currently    Comment: wine maybe 1 per month   Drug use: No   Sexual activity: Never  Other Topics Concern   Not on file  Social History Narrative   Right Handed    Lives in a one sotyr home. One step leads to front door.    Social Determinants of Health   Financial Resource Strain: Not on file  Food Insecurity: Not on file  Transportation Needs: Not on file  Physical Activity: Not on file  Stress: Not on file  Social Connections: Not on file     Family History: The patient's family history includes Asperger's syndrome in his son; Coronary artery disease in his brother; Heart disease in his father and mother; Hyperlipidemia in his son; Stroke in his mother. There is no history of Cancer.  ROS:   Please see the history of present illness.     All other systems reviewed and are negative.  EKGs/Labs/Other Studies Reviewed:    The following studies were reviewed today:  Echo 06/25/2021  1. No apical thrombus with Definity contrast. Left ventricular ejection  fraction, by estimation, is >75%. The left ventricle has hyperdynamic  function. The left ventricle has no regional wall motion abnormalities.  There is moderate left ventricular  hypertrophy of the basal-septal segment. Left ventricular diastolic  parameters are consistent with Grade I diastolic dysfunction (impaired  relaxation). Elevated left ventricular end-diastolic pressure. The E/e' is  76.    2. Right ventricular systolic function is normal. The right ventricular  size is normal.   3. The mitral valve is grossly normal. Trivial mitral valve  regurgitation.   4. The aortic valve was not well visualized. Aortic valve regurgitation  is trivial. There is a 26 mm St. Jude mechanical valve present in the  aortic position. Procedure Date: 2005. Echo findings are consistent with  normal structure and function of the  aortic valve prosthesis. Aortic valve mean gradient measures 11.0 mmHg.   5. Aortic dilatation noted. There is mild dilatation of the ascending  aorta, measuring 42 mm.   Comparison(s): Changes from prior study are noted. 10/11/2020: LVEF 60-65%,  St. Jude aortic valve with 9.8 mmHg gradient, dilated aorta to 43 mm.   EKG:  EKG is not ordered today.    Recent Labs: 12/28/2020: TSH 4.39 06/24/2021: ALT 24 06/26/2021: BUN 25; Creatinine, Ser 1.51; Hemoglobin 15.0; Platelets 227; Potassium 3.9; Sodium 138  Recent Lipid Panel    Component Value Date/Time   CHOL 143 06/25/2021 0358   CHOL 162 03/24/2021 0815   TRIG 240 (H) 06/25/2021 0358   HDL 30 (L) 06/25/2021 0358   HDL 29 (L) 03/24/2021 0815   CHOLHDL 4.8 06/25/2021 0358   VLDL 48 (H) 06/25/2021 0358   LDLCALC 65 06/25/2021 0358   LDLCALC 77 03/24/2021 0815   LDLCALC 99 09/22/2019 1119   LDLDIRECT 86.0 09/09/2020 0916     Risk Assessment/Calculations:           Physical Exam:    VS:  BP (!) 128/58 (BP Location: Left Arm, Patient Position: Sitting, Cuff Size: Large)   Pulse 67   Ht '5\' 9"'$  (1.753 m)   Wt 214 lb 12.8 oz (97.4 kg)   SpO2 92%   BMI 31.72 kg/m     Wt Readings from Last 3 Encounters:  07/12/21 214 lb 12.8 oz (97.4 kg)  07/06/21 217 lb 12.8 oz (98.8 kg)  06/24/21 225 lb 5 oz (102.2  kg)     GEN:  Well nourished, well developed in no acute distress HEENT: Normal NECK: No JVD; No carotid bruits LYMPHATICS: No lymphadenopathy CARDIAC: RRR, no murmurs, rubs, gallops RESPIRATORY:  Clear  to auscultation without rales, wheezing or rhonchi  ABDOMEN: Soft, non-tender, non-distended MUSCULOSKELETAL:  No edema; No deformity  SKIN: Warm and dry NEUROLOGIC:  Alert and oriented x 3 PSYCHIATRIC:  Normal affect   ASSESSMENT:    1. Coronary artery disease involving coronary bypass graft of native heart without angina pectoris   2. H/O mechanical aortic valve replacement   3. Essential hypertension   4. Hyperlipidemia LDL goal <70   5. Controlled type 2 diabetes mellitus without complication, without long-term current use of insulin ([place])   6. H/O: CVA (cerebrovascular accident)    PLAN:    In order of problems listed above:  CAD s/p CABG: Patient denies any recent chest pain  History of mechanical AVR: New INR goal 2.5-3.5 since the recent stroke.  I will discuss with Dr. Stanford Breed to see if he wished to add a low-dose aspirin to his medication list in order to decrease embolic risk  Hypertension: Blood pressure stable  Hyperlipidemia: On Crestor  DM2: Managed by primary care provider  History of CVA: Recent CVA was felt to be related to embolic event from the mechanical aortic valve.  Surprisingly, INR on arrival during the hospitalization was 2.7 which was therapeutic.  If the event was truly embolic, I I would recommend addition of 81 mg aspirin to decrease embolic risk.           Medication Adjustments/Labs and Tests Ordered: Current medicines are reviewed at length with the patient today.  Concerns regarding medicines are outlined above.  No orders of the defined types were placed in this encounter.  No orders of the defined types were placed in this encounter.   Patient Instructions  Medication Instructions:  Your physician recommends that you continue on your current medications as directed. Please refer to the Current Medication list given to you today.  *If you need a refill on your cardiac medications before your next appointment, please call your  pharmacy*  Lab Work: NONE ordered at this time of appointment   If you have labs (blood work) drawn today and your tests are completely normal, you will receive your results only by: [place] (if you have MyChart) OR A paper copy in the mail If you have any lab test that is abnormal or we need to change your treatment, we will call you to review the results.  Testing/Procedures: NONE ordered at this time of appointment   Follow-Up: At [facility], you and your health needs are our priority.  As part of our continuing mission to provide you with exceptional heart care, we have created designated Provider Care Teams.  These Care Teams include your primary Cardiologist (physician) and Advanced Practice Providers (APPs -  Physician Assistants and Nurse Practitioners) who all work together to provide you with the care you need, when you need it.  Your next appointment:   3 month(s)  The format for your next appointment:   In Person  Provider:   Kirk Ruths, MD     Other Instructions   Important Information About Sugar         Hilbert Corrigan, [place]  07/14/2021 10:31 PM    [place]

## 2021-07-12 NOTE — Patient Instructions (Signed)
Medication Instructions:  Your physician recommends that you continue on your current medications as directed. Please refer to the Current Medication list given to you today.  *If you need a refill on your cardiac medications before your next appointment, please call your pharmacy*  Lab Work: NONE ordered at this time of appointment   If you have labs (blood work) drawn today and your tests are completely normal, you will receive your results only by: [place] (if you have MyChart) OR A paper copy in the mail If you have any lab test that is abnormal or we need to change your treatment, we will call you to review the results.  Testing/Procedures: NONE ordered at this time of appointment   Follow-Up: At [facility], you and your health needs are our priority.  As part of our continuing mission to provide you with exceptional heart care, we have created designated Provider Care Teams.  These Care Teams include your primary Cardiologist (physician) and Advanced Practice Providers (APPs -  Physician Assistants and Nurse Practitioners) who all work together to provide you with the care you need, when you need it.  Your next appointment:   3 month(s)  The format for your next appointment:   In Person  Provider:   Kirk Ruths, MD     Other Instructions   Important Information About Sugar

## 2021-07-12 NOTE — Patient Instructions (Addendum)
Description   Continue to take warfarin 1 tablet daily except for 1/2 a tablet on Wednesdays.  Recheck INR in  2 weeks. Coumadin Clinic [phone] or [phone]

## 2021-07-13 ENCOUNTER — Ambulatory Visit: Payer: Medicare HMO | Admitting: Nurse Practitioner

## 2021-07-14 ENCOUNTER — Encounter: Payer: Self-pay | Admitting: Physician Assistant

## 2021-07-14 DIAGNOSIS — E669 Obesity, unspecified: Secondary | ICD-10-CM | POA: Diagnosis not present

## 2021-07-14 DIAGNOSIS — G4733 Obstructive sleep apnea (adult) (pediatric): Secondary | ICD-10-CM | POA: Diagnosis not present

## 2021-07-14 DIAGNOSIS — Z952 Presence of prosthetic heart valve: Secondary | ICD-10-CM | POA: Diagnosis not present

## 2021-07-14 DIAGNOSIS — E1122 Type 2 diabetes mellitus with diabetic chronic kidney disease: Secondary | ICD-10-CM | POA: Diagnosis not present

## 2021-07-14 DIAGNOSIS — Z9181 History of falling: Secondary | ICD-10-CM | POA: Diagnosis not present

## 2021-07-14 DIAGNOSIS — M48062 Spinal stenosis, lumbar region with neurogenic claudication: Secondary | ICD-10-CM | POA: Diagnosis not present

## 2021-07-14 DIAGNOSIS — I69320 Aphasia following cerebral infarction: Secondary | ICD-10-CM | POA: Diagnosis not present

## 2021-07-14 DIAGNOSIS — I2510 Atherosclerotic heart disease of native coronary artery without angina pectoris: Secondary | ICD-10-CM | POA: Diagnosis not present

## 2021-07-14 DIAGNOSIS — M1030 Gout due to renal impairment, unspecified site: Secondary | ICD-10-CM | POA: Diagnosis not present

## 2021-07-14 DIAGNOSIS — F17200 Nicotine dependence, unspecified, uncomplicated: Secondary | ICD-10-CM | POA: Diagnosis not present

## 2021-07-14 DIAGNOSIS — E1142 Type 2 diabetes mellitus with diabetic polyneuropathy: Secondary | ICD-10-CM | POA: Diagnosis not present

## 2021-07-14 DIAGNOSIS — Z951 Presence of aortocoronary bypass graft: Secondary | ICD-10-CM | POA: Diagnosis not present

## 2021-07-14 DIAGNOSIS — N183 Chronic kidney disease, stage 3 unspecified: Secondary | ICD-10-CM | POA: Diagnosis not present

## 2021-07-14 DIAGNOSIS — Z794 Long term (current) use of insulin: Secondary | ICD-10-CM | POA: Diagnosis not present

## 2021-07-14 DIAGNOSIS — N179 Acute kidney failure, unspecified: Secondary | ICD-10-CM | POA: Diagnosis not present

## 2021-07-14 DIAGNOSIS — E1165 Type 2 diabetes mellitus with hyperglycemia: Secondary | ICD-10-CM | POA: Diagnosis not present

## 2021-07-14 DIAGNOSIS — Z7901 Long term (current) use of anticoagulants: Secondary | ICD-10-CM | POA: Diagnosis not present

## 2021-07-14 DIAGNOSIS — Z6832 Body mass index (BMI) 32.0-32.9, adult: Secondary | ICD-10-CM | POA: Diagnosis not present

## 2021-07-14 DIAGNOSIS — I129 Hypertensive chronic kidney disease with stage 1 through stage 4 chronic kidney disease, or unspecified chronic kidney disease: Secondary | ICD-10-CM | POA: Diagnosis not present

## 2021-07-14 DIAGNOSIS — I69392 Facial weakness following cerebral infarction: Secondary | ICD-10-CM | POA: Diagnosis not present

## 2021-07-14 NOTE — Telephone Encounter (Signed)
Can we pull a download to review? I can make adjustments accordingly. He may also want to try adjusting his strap placement as well. Thanks!

## 2021-07-15 ENCOUNTER — Other Ambulatory Visit: Payer: Self-pay | Admitting: Physician Assistant

## 2021-07-15 ENCOUNTER — Telehealth: Payer: Self-pay | Admitting: Physician Assistant

## 2021-07-15 MED ORDER — ASPIRIN 81 MG PO TBEC: 81.0000 mg | DELAYED_RELEASE_TABLET | Freq: Every day | ORAL | Status: DC

## 2021-07-15 NOTE — Telephone Encounter (Signed)
I spoke with Dr. Stanford Breed and also spoke with the patient's wife, will add 81 mg aspirin on top of his Coumadin to further reduce embolic stroke risk considering he had a embolic stroke given was therapeutic INR.  Patient's wife is aware of the recommendation by cardiology service

## 2021-07-15 NOTE — Telephone Encounter (Signed)
Download showed that he is having breakthrough events with AHI of 8.4. No significant leaks. Pressure median is 7.2 and max is 13.9. I'm concerned his headaches could be coming from the breakthrough OSA he is experiencing. He could also be dropping his oxygen levels at night, which the CPAP doesn't monitor. I would like for him to undergo CPAP titration study as he may need BiPAP therapy, especially with his recent stroke. If agreeable, I will place the order today and PCCs can schedule him. Thanks!

## 2021-07-18 ENCOUNTER — Telehealth: Payer: Self-pay | Admitting: Family

## 2021-07-18 NOTE — Telephone Encounter (Signed)
Pt's wife states that the [facility] they have been seeing has not gone through with speech therapy yet. So, they would like a new referral placed to [facility] for speech therapy and nursing.

## 2021-07-19 NOTE — Telephone Encounter (Signed)
Kenney Houseman [facility]) called regarding getting a referral sent to a different agency for pt's speech therapy as [facility] is unable o provide that.

## 2021-07-20 ENCOUNTER — Other Ambulatory Visit: Payer: Self-pay

## 2021-07-20 DIAGNOSIS — Z952 Presence of prosthetic heart valve: Secondary | ICD-10-CM

## 2021-07-20 DIAGNOSIS — Z7901 Long term (current) use of anticoagulants: Secondary | ICD-10-CM

## 2021-07-20 DIAGNOSIS — I69320 Aphasia following cerebral infarction: Secondary | ICD-10-CM | POA: Diagnosis not present

## 2021-07-20 DIAGNOSIS — Z6832 Body mass index (BMI) 32.0-32.9, adult: Secondary | ICD-10-CM

## 2021-07-20 DIAGNOSIS — E119 Type 2 diabetes mellitus without complications: Secondary | ICD-10-CM | POA: Diagnosis not present

## 2021-07-20 DIAGNOSIS — E1122 Type 2 diabetes mellitus with diabetic chronic kidney disease: Secondary | ICD-10-CM | POA: Diagnosis not present

## 2021-07-20 DIAGNOSIS — M48062 Spinal stenosis, lumbar region with neurogenic claudication: Secondary | ICD-10-CM | POA: Diagnosis not present

## 2021-07-20 DIAGNOSIS — I69392 Facial weakness following cerebral infarction: Secondary | ICD-10-CM | POA: Diagnosis not present

## 2021-07-20 DIAGNOSIS — E1142 Type 2 diabetes mellitus with diabetic polyneuropathy: Secondary | ICD-10-CM | POA: Diagnosis not present

## 2021-07-20 DIAGNOSIS — M1030 Gout due to renal impairment, unspecified site: Secondary | ICD-10-CM | POA: Diagnosis not present

## 2021-07-20 DIAGNOSIS — N183 Chronic kidney disease, stage 3 unspecified: Secondary | ICD-10-CM | POA: Diagnosis not present

## 2021-07-20 DIAGNOSIS — I2510 Atherosclerotic heart disease of native coronary artery without angina pectoris: Secondary | ICD-10-CM | POA: Diagnosis not present

## 2021-07-20 DIAGNOSIS — R4701 Aphasia: Secondary | ICD-10-CM

## 2021-07-20 DIAGNOSIS — E1165 Type 2 diabetes mellitus with hyperglycemia: Secondary | ICD-10-CM | POA: Diagnosis not present

## 2021-07-20 DIAGNOSIS — F17200 Nicotine dependence, unspecified, uncomplicated: Secondary | ICD-10-CM

## 2021-07-20 DIAGNOSIS — Z794 Long term (current) use of insulin: Secondary | ICD-10-CM

## 2021-07-20 DIAGNOSIS — Z951 Presence of aortocoronary bypass graft: Secondary | ICD-10-CM

## 2021-07-20 DIAGNOSIS — N179 Acute kidney failure, unspecified: Secondary | ICD-10-CM | POA: Diagnosis not present

## 2021-07-20 DIAGNOSIS — Z9181 History of falling: Secondary | ICD-10-CM

## 2021-07-20 DIAGNOSIS — G4733 Obstructive sleep apnea (adult) (pediatric): Secondary | ICD-10-CM | POA: Diagnosis not present

## 2021-07-20 DIAGNOSIS — I63412 Cerebral infarction due to embolism of left middle cerebral artery: Secondary | ICD-10-CM

## 2021-07-20 DIAGNOSIS — I129 Hypertensive chronic kidney disease with stage 1 through stage 4 chronic kidney disease, or unspecified chronic kidney disease: Secondary | ICD-10-CM | POA: Diagnosis not present

## 2021-07-20 DIAGNOSIS — E669 Obesity, unspecified: Secondary | ICD-10-CM

## 2021-07-20 NOTE — Telephone Encounter (Signed)
Referral entered for speech therapy with [facility]

## 2021-07-21 ENCOUNTER — Encounter: Payer: Self-pay | Admitting: Family

## 2021-07-21 DIAGNOSIS — R0681 Apnea, not elsewhere classified: Secondary | ICD-10-CM | POA: Diagnosis not present

## 2021-07-21 DIAGNOSIS — Z9181 History of falling: Secondary | ICD-10-CM | POA: Diagnosis not present

## 2021-07-21 DIAGNOSIS — I2510 Atherosclerotic heart disease of native coronary artery without angina pectoris: Secondary | ICD-10-CM | POA: Diagnosis not present

## 2021-07-21 DIAGNOSIS — G4733 Obstructive sleep apnea (adult) (pediatric): Secondary | ICD-10-CM | POA: Diagnosis not present

## 2021-07-21 DIAGNOSIS — Z952 Presence of prosthetic heart valve: Secondary | ICD-10-CM | POA: Diagnosis not present

## 2021-07-21 DIAGNOSIS — I10 Essential (primary) hypertension: Secondary | ICD-10-CM | POA: Diagnosis not present

## 2021-07-21 DIAGNOSIS — E1122 Type 2 diabetes mellitus with diabetic chronic kidney disease: Secondary | ICD-10-CM | POA: Diagnosis not present

## 2021-07-21 DIAGNOSIS — Z7901 Long term (current) use of anticoagulants: Secondary | ICD-10-CM | POA: Diagnosis not present

## 2021-07-21 DIAGNOSIS — Z951 Presence of aortocoronary bypass graft: Secondary | ICD-10-CM | POA: Diagnosis not present

## 2021-07-21 DIAGNOSIS — E669 Obesity, unspecified: Secondary | ICD-10-CM | POA: Diagnosis not present

## 2021-07-21 DIAGNOSIS — R0683 Snoring: Secondary | ICD-10-CM | POA: Diagnosis not present

## 2021-07-21 DIAGNOSIS — E1165 Type 2 diabetes mellitus with hyperglycemia: Secondary | ICD-10-CM | POA: Diagnosis not present

## 2021-07-21 DIAGNOSIS — I69320 Aphasia following cerebral infarction: Secondary | ICD-10-CM | POA: Diagnosis not present

## 2021-07-21 DIAGNOSIS — Z6832 Body mass index (BMI) 32.0-32.9, adult: Secondary | ICD-10-CM | POA: Diagnosis not present

## 2021-07-21 DIAGNOSIS — Z794 Long term (current) use of insulin: Secondary | ICD-10-CM | POA: Diagnosis not present

## 2021-07-21 DIAGNOSIS — M1030 Gout due to renal impairment, unspecified site: Secondary | ICD-10-CM | POA: Diagnosis not present

## 2021-07-21 DIAGNOSIS — N179 Acute kidney failure, unspecified: Secondary | ICD-10-CM | POA: Diagnosis not present

## 2021-07-21 DIAGNOSIS — I69392 Facial weakness following cerebral infarction: Secondary | ICD-10-CM | POA: Diagnosis not present

## 2021-07-21 DIAGNOSIS — I129 Hypertensive chronic kidney disease with stage 1 through stage 4 chronic kidney disease, or unspecified chronic kidney disease: Secondary | ICD-10-CM | POA: Diagnosis not present

## 2021-07-21 DIAGNOSIS — N183 Chronic kidney disease, stage 3 unspecified: Secondary | ICD-10-CM | POA: Diagnosis not present

## 2021-07-21 DIAGNOSIS — M48062 Spinal stenosis, lumbar region with neurogenic claudication: Secondary | ICD-10-CM | POA: Diagnosis not present

## 2021-07-21 DIAGNOSIS — F17200 Nicotine dependence, unspecified, uncomplicated: Secondary | ICD-10-CM | POA: Diagnosis not present

## 2021-07-21 DIAGNOSIS — E1142 Type 2 diabetes mellitus with diabetic polyneuropathy: Secondary | ICD-10-CM | POA: Diagnosis not present

## 2021-07-25 NOTE — Telephone Encounter (Signed)
Called pt and was advised

## 2021-07-26 ENCOUNTER — Ambulatory Visit: Payer: Medicare HMO | Admitting: Physician Assistant

## 2021-07-26 ENCOUNTER — Encounter: Payer: Self-pay | Admitting: Physician Assistant

## 2021-07-26 ENCOUNTER — Telehealth: Payer: Self-pay | Admitting: Family

## 2021-07-26 ENCOUNTER — Ambulatory Visit (INDEPENDENT_AMBULATORY_CARE_PROVIDER_SITE_OTHER): Payer: Medicare HMO

## 2021-07-26 VITALS — BP 146/77 | HR 60 | Resp 18 | Ht 69.0 in | Wt 213.0 lb

## 2021-07-26 DIAGNOSIS — I2510 Atherosclerotic heart disease of native coronary artery without angina pectoris: Secondary | ICD-10-CM | POA: Diagnosis not present

## 2021-07-26 DIAGNOSIS — Z951 Presence of aortocoronary bypass graft: Secondary | ICD-10-CM | POA: Diagnosis not present

## 2021-07-26 DIAGNOSIS — N189 Chronic kidney disease, unspecified: Secondary | ICD-10-CM | POA: Diagnosis not present

## 2021-07-26 DIAGNOSIS — R413 Other amnesia: Secondary | ICD-10-CM

## 2021-07-26 DIAGNOSIS — Z952 Presence of prosthetic heart valve: Secondary | ICD-10-CM

## 2021-07-26 DIAGNOSIS — Z9181 History of falling: Secondary | ICD-10-CM | POA: Diagnosis not present

## 2021-07-26 DIAGNOSIS — I63412 Cerebral infarction due to embolism of left middle cerebral artery: Secondary | ICD-10-CM | POA: Diagnosis not present

## 2021-07-26 DIAGNOSIS — E1165 Type 2 diabetes mellitus with hyperglycemia: Secondary | ICD-10-CM | POA: Diagnosis not present

## 2021-07-26 DIAGNOSIS — Z7901 Long term (current) use of anticoagulants: Secondary | ICD-10-CM

## 2021-07-26 DIAGNOSIS — I69390 Apraxia following cerebral infarction: Secondary | ICD-10-CM | POA: Diagnosis not present

## 2021-07-26 DIAGNOSIS — I69320 Aphasia following cerebral infarction: Secondary | ICD-10-CM | POA: Diagnosis not present

## 2021-07-26 DIAGNOSIS — E1122 Type 2 diabetes mellitus with diabetic chronic kidney disease: Secondary | ICD-10-CM | POA: Diagnosis not present

## 2021-07-26 DIAGNOSIS — I69318 Other symptoms and signs involving cognitive functions following cerebral infarction: Secondary | ICD-10-CM | POA: Diagnosis not present

## 2021-07-26 DIAGNOSIS — Z794 Long term (current) use of insulin: Secondary | ICD-10-CM | POA: Diagnosis not present

## 2021-07-26 DIAGNOSIS — I129 Hypertensive chronic kidney disease with stage 1 through stage 4 chronic kidney disease, or unspecified chronic kidney disease: Secondary | ICD-10-CM | POA: Diagnosis not present

## 2021-07-26 LAB — POCT INR: INR: 5.2 — AB (ref 2.0–3.0)

## 2021-07-26 NOTE — Progress Notes (Signed)
Assessment/Plan:   Memory difficulty without behavioral disturbance  left MCA patchy infarct embolic pattern likely secondary to mechanical valve although on Coumadin with therapeutic INR   hypertension, hyperlipidemia, DM2, history of CVA, OSA, St 3 CKD, CAD s/p CABG, s/p AVR on Coumadin, h/o prostate cancer, h/o bradycardia, hemochromatosis, B hearing loss seen today for evaluation of memory loss. MoCA today is 28/30.  Since his last visit the patient had a stroke requiring hospitalization. MRI of the brain May 2023 showed small to moderate left MCA branch infarct affecting left insular and frontal cortex, Small chronic cerebellar infarcts and mild chronic microvascular ischemia and cerebral volume loss for age. CTA showed no intracranial large vessel occlusion, moderate to severe narrowing of the right cavernous carotid, moderate narrowing of the right supraglenoid carotid.  CTA of the neck showed no significant stenosis in the neck.  He is currently on Coumadin aspirin as per cardiology. Echocardiogram obtained during this admission showed EF greater than 75%, normal functioning of the aortic valve prosthesis.  Patient was seen by stroke team who felt he had an embolic CVA despite therapeutic INR.  On presentation, he had dysarthria and bilateral leg weakness, with complete resolution except for mild lingering dysarthria.. New target INR is 2.5-3.5   Recommendations:    Continue donepezil 10 mg daily Side effects were discussed Continue speech therapy, home health Continue baby aspirin and Coumadin as recommended by cardiology. Continue to use a CPAP with possible transition to in view of recent stroke Keep the appt with Neurocognitive testing  Follow up in 6  months.   Case discussed with Dr. Delice Lesch who agrees with the plan     Subjective:    [name] is a very pleasant 76 y.o. RH male  seen today in follow up for memory loss. This patient is accompanied in the office by  his who supplements the history.  Previous records as well as any outside records available were reviewed prior to todays visit.  Patient was last seen at our office on 12/28/2020 at which time his MoCA was 28/30.  Patient is not on dementia medications.    Any memory changes since prior visit: Patient denies any changes, "my memory is about the same " Patient lives with: Spouse  repeats oneself?  "Not that often " Disoriented when walking into a room?  Patient denies   Leaving objects in unusual places?  Patient denies   Ambulates  with difficulty?   Patient denies   Recent falls?  Patient denies   Any head injuries?  Patient denies   History of seizures?   Patient denies   Wandering behavior?  Patient denies   Patient drives?   Patient no longer drives since the stroke  any mood changes such irritability agitation?  Patient denies   Any history of depression?:  Patient denies   Hallucinations?  Patient denies   Paranoia?  Patient denies   Patient reports that he sleeps well without vivid dreams, REM behavior or sleepwalking   History of sleep apnea?  Endorsed, he uses CPAP. Any hygiene concerns?  Patient denies   Independent of bathing and dressing?  Endorsed  Does the patient needs help with medications?  Patient is in charge Who is in charge of the finances?  Patient is in charge  Any changes in appetite?  Patient denies   Patient have trouble swallowing? Patient denies   Does the patient cook?  Patient denies   Any kitchen accidents such as leaving  the stove on? Patient denies   Any headaches?  Patient denies   The double vision? Patient denies   Any focal numbness or tingling?  Patient denies   Chronic back pain Patient denies   Unilateral weakness?  Patient denies   Any tremors?  Patient denies   Any history of anosmia?  Patient denies   Any incontinence of urine?  Patient denies, but he does get up 2 times at night to urinate. Any bowel dysfunction?   Patient denies   MRI  of the brain May 2023 showed small to moderate left MCA branch infarct affecting left insular and frontal cortex. 2. Small chronic cerebellar infarcts. Mild chronic microvascular ischemia and cerebral volume loss for age.  CT angio of the head and neck no intracranial large vessel occlusion. Moderate to severe narrowing of the right cavernous carotid and moderate narrowing in the right supraclinoid carotid, left cavernous carotid, and distal left V4. 2.  No hemodynamically significant stenosis in the neck. 3. Diffuse osseous sclerosis, which is nonspecific but can be seen in the setting of renal osteodystrophy, Paget's disease, and diffuse metastatic disease, among other conditions.  Latest labs triglycerides 240, HDL 30 VLDL 48, A1c 8.3 2D Echo EF > 75%, normal function of mechanical valve   Past Medical History:  Diagnosis Date   Allergy    Aortic stenosis    Arthritis    CAD (coronary artery disease)    cabg   Cancer ([place]) 01/30/2009   prostate, skin   Chronic kidney disease    Complication of anesthesia    Erectile dysfunction    Heart murmur    Hemochromatosis 02/14/2011   Hemorrhoids    History of gout    History of hepatitis 01/31/1972   Hyperlipidemia    Hypertension    Hypertr obst cardiomyop    Mechanical heart valve present    Manufacturer: [employer] #: [id number]  Model #: [id number]. Card states MRI compatible with 3 teslas or less.   Obesity    moderate   PONV (postoperative nausea and vomiting)    after CABG- slow to wake up   Sensorineural hearing loss    Sleep apnea    Stroke [facility])    Old stroke - patient was not aware.   Type II or unspecified type diabetes mellitus without mention of complication, not stated as uncontrolled      Past Surgical History:  Procedure Laterality Date   AORTIC VALVE REPLACEMENT  11/14/2003   [facility]   APPENDECTOMY  1990   CHOLECYSTECTOMY  1990   CORONARY ARTERY BYPASS GRAFT  10/2003   HERNIA REPAIR  1999    right, inguinal   HERNIA REPAIR  2002   left, inguinal   HIP SURGERY  2006   right hip   LUMBAR LAMINECTOMY/DECOMPRESSION MICRODISCECTOMY Bilateral 02/04/2021   Procedure: Bilateral Lumbar Two-Three Laminectomy;  Surgeon: Kristeen Miss, MD;  Location: [place];  Service: Neurosurgery;  Laterality: Bilateral;  3C/RM 20   PILONIDAL CYST EXCISION  1964   prostate seed implant  3/12   TONSILLECTOMY  childhood     PREVIOUS MEDICATIONS:   CURRENT MEDICATIONS:  Outpatient Encounter Medications as of 07/26/2021  Medication Sig   AMBULATORY NON FORMULARY MEDICATION cpap cushions            AirFit F20 (Size: Large)           AirFit F30 (Size: Med)   Please FAX the prescription to:  [phone]  aspirin EC 81 MG tablet Take 1 tablet (81 mg total) by mouth daily. Swallow whole.   Blood Glucose Monitoring Suppl Supplies MISC Use for monitoring glucose level   Continuous Blood Gluc Sensor (DEXCOM G6 SENSOR) MISC 1 Device by Does not apply route as directed.   Continuous Blood Gluc Transmit (DEXCOM G6 TRANSMITTER) MISC 1 Device by Does not apply route as directed.   dapagliflozin propanediol (FARXIGA) 10 MG TABS tablet Take 1 tablet (10 mg total) by mouth daily before breakfast.   fenofibrate (TRICOR) 145 MG tablet TAKE 1 TABLET BY MOUTH EVERY DAY   furosemide (LASIX) 20 MG tablet TAKE 1 TABLET BY MOUTH EVERY DAY AS NEEDED FOR SWELLING   glucose blood (ONETOUCH VERIO) test strip Use as instructed   insulin aspart (NOVOLOG FLEXPEN) 100 UNIT/ML FlexPen Max daily 70 units (Patient taking differently: Using sliding scale, pts spouse unsure of dosage)   Insulin Glargine (BASAGLAR KWIKPEN) 100 UNIT/ML Inject 24 Units into the skin daily.   Insulin Pen Needle 32G X 4 MM MISC 1 Device by Does not apply route in the morning, at noon, in the evening, and at bedtime.   metoprolol succinate (TOPROL-XL) 25 MG 24 hr tablet TAKE 1 TABLET BY MOUTH EVERY DAY   OneTouch Delica Lancets 62I MISC USE AS DIRECTED    rosuvastatin (CRESTOR) 40 MG tablet Take 1 tablet (40 mg total) by mouth daily. (Patient taking differently: Take 40 mg by mouth every evening.)   tacrolimus (PROTOPIC) 0.1 % ointment Apply 1 application  topically daily as needed (irritation).   warfarin (COUMADIN) 7.5 MG tablet Take 1/2 a tablet to 1 tablet by mouth daily as directed by the coumadin clinic   betamethasone dipropionate (DIPROLENE) 0.05 % cream Apply topically 2 (two) times daily as needed. To eczema rash (Patient not taking: Reported on 07/12/2021)   colchicine 0.6 MG tablet TAKE 2 TABS BY MOUTH NOW AND THEN 1 TAB IN 1 HOUR FOR GOUT. (MAX 3 TABS/24 HRS) (Patient not taking: Reported on 07/12/2021)   enoxaparin (LOVENOX) 100 MG/ML injection Inject 1 mL (100 mg total) into the skin every 12 (twelve) hours for 3 days. (Patient not taking: Reported on 07/12/2021)   No facility-administered encounter medications on file as of 07/26/2021.     Objective:     PHYSICAL EXAMINATION:    VITALS:   Vitals:   07/26/21 1127  BP: (!) 146/77  Pulse: 60  Resp: 18  SpO2: 98%  Weight: 213 lb (96.6 kg)  Height: '5\' 9"'$  (1.753 m)    GEN:  The patient appears stated age and is in NAD. HEENT:  Normocephalic, atraumatic.   Neurological examination:  General: NAD, well-groomed, appears stated age. Orientation: The patient is alert. Oriented to person, place and date Cranial nerves: There is good facial symmetry.The speech is fluent and clear. No aphasia, very mild dysarthria. Fund of knowledge is appropriate. Recent memory impaired and remote memory is normal.  Attention and concentration are normal.  Able to name objects and repeat phrases.  Hearing is intact to conversational tone.    Sensation: Sensation is intact to light touch throughout Motor: Strength is at least antigravity x4. Tremors: none  DTR's 2/4 in UE/LE      12/28/2020    8:00 AM  Montreal Cognitive Assessment   Visuospatial/ Executive (0/5) 5  Naming (0/3) 3   Attention: Read list of digits (0/2) 2  Attention: Read list of letters (0/1) 0  Attention: Serial 7 subtraction starting at 100 (0/3)  3  Language: Repeat phrase (0/2) 1  Language : Fluency (0/1) 1  Abstraction (0/2) 2  Delayed Recall (0/5) 5  Orientation (0/6) 6  Total 28       01/19/2016    2:24 PM  MMSE - Mini Mental State Exam  Orientation to time 5  Orientation to Place 5  Registration 3  Attention/ Calculation 5  Recall 2  Language- name 2 objects 2  Language- repeat 1  Language- follow 3 step command 3  Language- read & follow direction 1  Write a sentence 1  Copy design 1  Total score 29       Movement examination: Tone: There is normal tone in the UE/LE Abnormal movements:  no tremor.  No myoclonus.  No asterixis.   Coordination:  There is no decremation with RAM's. Normal finger to nose  Gait and Station: The patient has no difficulty arising out of a deep-seated chair without the use of the hands. The patient's stride length is good.  Gait is cautious and narrow.   Thank you for allowing [name] the opportunity to participate in the care of this nice patient. Please do not hesitate to contact us for any questions or concerns.   Total time spent on today's visit was 47 minutes dedicated to this patient today, preparing to see patient, examining the patient, ordering tests and/or medications and counseling the patient, documenting clinical information in the EHR or other health record, independently interpreting results and communicating results to the patient/family, discussing treatment and goals, answering patient's questions and coordinating care.  Cc:  [name], NP  Sharene Butters 07/26/2021 11:45 AM   Cc:  [name], NP Sharene Butters, PA-C

## 2021-07-27 NOTE — Telephone Encounter (Signed)
Called but no answer, lvm for agency to call back for orders

## 2021-07-28 ENCOUNTER — Telehealth: Payer: Self-pay | Admitting: Cardiology

## 2021-07-28 DIAGNOSIS — I63512 Cerebral infarction due to unspecified occlusion or stenosis of left middle cerebral artery: Secondary | ICD-10-CM | POA: Insufficient documentation

## 2021-07-28 NOTE — Telephone Encounter (Signed)
Wife calling in to say the need a order for the patient to be able to have is INR check by the home health nurse.  [facility] fax number [phone]. Telephone [facility] [phone] Please advise

## 2021-07-28 NOTE — Telephone Encounter (Signed)
Called and spoke with Vivien Rota, RN at [facility]. Gave verbal order to check INR at next Home Health visit. Vivien Rota, RN stated she will go back to pt's home on 08/04/21 and will check INR then. Also provided Vivien Rota with [facility]in [facility] number and instructed to call with INR results while she is at pt's home to discuss dosing instructions. Verbalized understanding.

## 2021-07-29 DIAGNOSIS — I10 Essential (primary) hypertension: Secondary | ICD-10-CM | POA: Diagnosis not present

## 2021-07-29 DIAGNOSIS — G4733 Obstructive sleep apnea (adult) (pediatric): Secondary | ICD-10-CM | POA: Diagnosis not present

## 2021-07-29 DIAGNOSIS — R0681 Apnea, not elsewhere classified: Secondary | ICD-10-CM | POA: Diagnosis not present

## 2021-07-29 DIAGNOSIS — R0683 Snoring: Secondary | ICD-10-CM | POA: Diagnosis not present

## 2021-08-03 ENCOUNTER — Telehealth: Payer: Self-pay | Admitting: Family

## 2021-08-03 NOTE — Telephone Encounter (Signed)
Caller/Agency: [facility] Number: [phone] Requesting OT/PT/Skilled Nursing/Social Work/Speech Therapy: Speech Therapy Frequency: 1x for 7 weeks.

## 2021-08-03 NOTE — Telephone Encounter (Signed)
Paper orders received have been faxed back

## 2021-08-03 NOTE — Telephone Encounter (Signed)
Verbal orders given ok by pcp

## 2021-08-04 ENCOUNTER — Telehealth: Payer: Self-pay | Admitting: Cardiology

## 2021-08-04 ENCOUNTER — Ambulatory Visit (INDEPENDENT_AMBULATORY_CARE_PROVIDER_SITE_OTHER): Payer: Medicare HMO | Admitting: *Deleted

## 2021-08-04 DIAGNOSIS — Z7901 Long term (current) use of anticoagulants: Secondary | ICD-10-CM | POA: Diagnosis not present

## 2021-08-04 DIAGNOSIS — Z952 Presence of prosthetic heart valve: Secondary | ICD-10-CM

## 2021-08-04 LAB — POCT INR: INR: 3.4 — AB (ref 2.0–3.0)

## 2021-08-04 NOTE — Telephone Encounter (Signed)
Sent to [facility]in Clinic.

## 2021-08-04 NOTE — Telephone Encounter (Signed)
Please refer to Anticoagulation Encounter regarding details for results.

## 2021-08-04 NOTE — Telephone Encounter (Signed)
Calling to report pt's PT INR. Results below. Please advise 40.5 - 3.4

## 2021-08-09 ENCOUNTER — Ambulatory Visit: Payer: Medicare HMO | Admitting: Nurse Practitioner

## 2021-08-09 ENCOUNTER — Encounter: Payer: Self-pay | Admitting: Nurse Practitioner

## 2021-08-09 VITALS — BP 98/58 | HR 57 | Ht 69.0 in | Wt 216.2 lb

## 2021-08-09 DIAGNOSIS — I63512 Cerebral infarction due to unspecified occlusion or stenosis of left middle cerebral artery: Secondary | ICD-10-CM

## 2021-08-09 DIAGNOSIS — G4733 Obstructive sleep apnea (adult) (pediatric): Secondary | ICD-10-CM | POA: Diagnosis not present

## 2021-08-09 NOTE — Assessment & Plan Note (Signed)
Recovering well. Residual headaches and some speech difficulties. He has speech therapy following him. He also follows with neurology.

## 2021-08-09 NOTE — Progress Notes (Signed)
Reviewed and agree with assessment/plan.   Chesley Mires, MD [facility] Pulmonary/Critical Care 08/09/2021, 2:24 PM Pager:  [phone]

## 2021-08-09 NOTE — Patient Instructions (Addendum)
Change CPAP pressures to auto 10-20 cmH2O. Continue to use it every night, minimum of 4-6 hours a night.  Change equipment every 30 days or as directed by DME. Wash your tubing with warm soap and water daily, hang to dry. Wash humidifier portion weekly.  Be aware of reduced alertness and do not drive or operate heavy machinery if experiencing this or drowsiness. Healthy weight management discussed.  Avoid or decrease alcohol consumption and medications that make you more sleepy, if possible. Notify if persistent daytime sleepiness occurs even with consistent use of CPAP.  CPAP titration study ordered today    Follow up after CPAP titration with Dr. Halford Chessman or Katie Antonia Culbertson,NP. If symptoms do not improve or worsen, please contact office for sooner follow up or seek emergency care.

## 2021-08-09 NOTE — Assessment & Plan Note (Signed)
Excellent compliance. He is still having breakthrough events with AHI 11.4. Appears he is having more central events post stroke. He likes his current mask and feels like he doesn't have any issues with it so he does not want to change it; he did have some leaks on download. We will send him for CPAP titration; may need BiPAP. In the interim, we will adjust his pressures to 10-20cmH2O   Patient Instructions  Change CPAP pressures to auto 10-20 cmH2O. Continue to use it every night, minimum of 4-6 hours a night.  Change equipment every 30 days or as directed by DME. Wash your tubing with warm soap and water daily, hang to dry. Wash humidifier portion weekly.  Be aware of reduced alertness and do not drive or operate heavy machinery if experiencing this or drowsiness. Healthy weight management discussed.  Avoid or decrease alcohol consumption and medications that make you more sleepy, if possible. Notify if persistent daytime sleepiness occurs even with consistent use of CPAP.  CPAP titration study ordered today    Follow up after CPAP titration with Dr. Halford Chessman or Katie Shafter Jupin,NP. If symptoms do not improve or worsen, please contact office for sooner follow up or seek emergency care.

## 2021-08-09 NOTE — Progress Notes (Signed)
$'@Patient'R$  ID: [name], male    DOB: Jun 24, 1945, 76 y.o.   MRN: [id number]  Chief Complaint  Patient presents with   Follow-up    Pt f/u, using CPAP currently everything going well    Referring provider: [name], NP  HPI: 76 year old male, current remote cigar smoker followed for severe obstructive sleep apnea.  He is patient Dr. Juanetta Montgomery and last seen in office on 06/01/2021.  Since, he was hospitalized for acute CVA from 06/24/2021-06/26/2021 with residual speech difficulties and headaches. Past medical history significant for allergies, AAS status post AVR, CAD status post CABG, prostate cancer, CKD, ED, hemochromatosis, gout, hypertension, HLD, CVA, DM 2.  TEST/EVENTS:  03/06/2012 HST: AHI 18.4, SPO2 low 82% 10/11/2020 echocardiogram: EF 60 to 65%, G2 DD.  [facility] AVR in place. 05/24/2021 HST: AHI 41.2, SPO2 low 85%  03/09/2021: OV with Dr. Halford Montgomery for sleep consult.  Previously had sleep study in 2014 that showed moderate sleep apnea.  He was started on CPAP, tried several masks and felt like he Getting dried out so he stopped using it.  He was advised by his PCP to have reevaluation for his OSA.  Wife did report at visit that he snores and stops breathing at times.  Also grinds his teeth at night.  Naps pretty much every afternoon and falls asleep easily if TV shows morning.  Reports feeling okay in the morning.  Wakes 1-2 times a night to use the bathroom.  HST ordered for further evaluation.  06/01/2021: OV with David Gillum NP for follow-up to discuss home sleep study results which showed severe obstructive sleep apnea.  He is agreeable to restarting CPAP therapy.  He does already have a machine at home ; however it is quite old.  Reports that it does still work but would need settings adjusted.  Is concerned slightly about getting dried out as this was a problem of his in the past.  He feels as though he does sleep relatively well at night; although thinking back, he does wake  frequently to adjust himself in the bed.  Sleeps around 9 hours a night.  Denies morning headaches.  Actually reports feeling relatively okay in the morning when he wakes up.  Denies any drowsy driving, sleepwalking, cataplexy, narcolepsy. Restarted on CPAP therapy 5-20 and advise he use saline nasal gel for dryness. Orders sent for new machine.  08/09/2021: Today - follow up Patient presents today with wife for follow-up.  He recently suffered a stroke and has had some aphasia and headaches since.  Otherwise no other residual effects.  He reports that he has been sleeping well with his CPAP and has been using it every night without fail.  He does like his new mask very much.  Has not noticed any significant leaks.  He wakes feeling about the same as he did previously, may be slightly more rested.  He has had some morning headaches but these started after he had his stroke and also come and go throughout the day.  He has not been driving since the event.  07/09/2021-08/07/2021 CPAP 5-20 cmH2O 30/30 days; 93% >4 hr; av use 6 hr 16 min Pressure median 6.3, 95th 98 Leaks median 10.8, 95th 34.5 AHI 11.4   No Known Allergies  Immunization History  Administered Date(s) Administered   Influenza Split 11/25/2010, 11/21/2011   Influenza Whole 11/30/2008, 10/13/2009   Influenza, High Dose Seasonal PF 09/18/2016, 10/23/2018, 11/12/2019, 11/29/2020   Influenza,inj,Quad PF,6+ Mos 12/06/2012, 09/30/2013, 10/01/2014, 10/15/2015  Influenza-Unspecified 09/18/2016, 09/30/2017   PFIZER(Purple Top)SARS-COV-2 Vaccination 03/26/2019, 04/15/2019, 11/12/2019   Pfizer Covid-19 Vaccine Bivalent Booster 24yr & up 11/29/2020   Pneumococcal Conjugate-13 02/12/2013   Pneumococcal Polysaccharide-23 05/19/2011   Td 06/01/2000   Tdap 03/01/2012   Zoster, Live 06/13/2010    Past Medical History:  Diagnosis Date   Allergy    Aortic stenosis    Arthritis    CAD (coronary artery disease)    cabg   Cancer ([place]  01/30/2009   prostate, skin   Chronic kidney disease    Complication of anesthesia    Erectile dysfunction    Heart murmur    Hemochromatosis 02/14/2011   Hemorrhoids    History of gout    History of hepatitis 01/31/1972   Hyperlipidemia    Hypertension    Hypertr obst cardiomyop    Mechanical heart valve present    Manufacturer: [employer]#: [id number] Model #: [id number] Card states MRI compatible with 3 teslas or less.   Obesity    moderate   PONV (postoperative nausea and vomiting)    after CABG- slow to wake up   Sensorineural hearing loss    Sleep apnea    Stroke ([facility]    Old stroke - patient was not aware.   Type II or unspecified type diabetes mellitus without mention of complication, not stated as uncontrolled     Tobacco History: Social History   Tobacco Use  Smoking Status Former   Types: Cigars   Quit date: 06/2021   Years since quitting: 0.1  Smokeless Tobacco Never  Tobacco Comments   Pt states he smokes 1 cigar about once a month. 06/01/2021   Pt quit  Cigars 06/2021   Counseling given: Not Answered Tobacco comments: Pt states he smokes 1 cigar about once a month. 06/01/2021 Pt quit  Cigars 06/2021   Outpatient Medications Prior to Visit  Medication Sig Dispense Refill   AMBULATORY NON FORMULARY MEDICATION cpap cushions            AirFit F20 (Size: Large)           AirFit F30 (Size: Med)   Please FAX the prescription to:  [phone] each prn   aspirin EC 81 MG tablet Take 1 tablet (81 mg total) by mouth daily. Swallow whole.     betamethasone dipropionate (DIPROLENE) 0.05 % cream Apply topically 2 (two) times daily as needed. To eczema rash 30 g 1   Blood Glucose Monitoring Suppl Supplies MISC Use for monitoring glucose level 100 each 1   colchicine 0.6 MG tablet TAKE 2 TABS BY MOUTH NOW AND THEN 1 TAB IN 1 HOUR FOR GOUT. (MAX 3 TABS/24 HRS) 9 tablet 1   Continuous Blood Gluc Sensor (DEXCOM G6 SENSOR) MISC 1 Device by Does not apply  route as directed. 9 each 3   Continuous Blood Gluc Transmit (DEXCOM G6 TRANSMITTER) MISC 1 Device by Does not apply route as directed. 1 each 3   dapagliflozin propanediol (FARXIGA) 10 MG TABS tablet Take 1 tablet (10 mg total) by mouth daily before breakfast. 90 tablet 3   fenofibrate (TRICOR) 145 MG tablet TAKE 1 TABLET BY MOUTH EVERY DAY 90 tablet 1   furosemide (LASIX) 20 MG tablet TAKE 1 TABLET BY MOUTH EVERY DAY AS NEEDED FOR SWELLING 90 tablet 1   glucose blood (ONETOUCH VERIO) test strip Use as instructed 100 strip 12   insulin aspart (NOVOLOG FLEXPEN) 100 UNIT/ML FlexPen Max daily 70 units (  Patient taking differently: Using sliding scale, pts spouse unsure of dosage) 75 mL 3   Insulin Glargine (BASAGLAR KWIKPEN) 100 UNIT/ML Inject 24 Units into the skin daily. 30 mL 3   Insulin Pen Needle 32G X 4 MM MISC 1 Device by Does not apply route in the morning, at noon, in the evening, and at bedtime. 400 each 3   metoprolol succinate (TOPROL-XL) 25 MG 24 hr tablet TAKE 1 TABLET BY MOUTH EVERY DAY 90 tablet 1   OneTouch Delica Lancets 79K MISC USE AS DIRECTED 100 each 1   tacrolimus (PROTOPIC) 0.1 % ointment Apply 1 application  topically daily as needed (irritation).     warfarin (COUMADIN) 7.5 MG tablet Take 1/2 a tablet to 1 tablet by mouth daily as directed by the coumadin clinic 35 tablet 2   enoxaparin (LOVENOX) 100 MG/ML injection Inject 1 mL (100 mg total) into the skin every 12 (twelve) hours for 3 days. (Patient not taking: Reported on 07/12/2021) 6 mL 0   rosuvastatin (CRESTOR) 40 MG tablet Take 1 tablet (40 mg total) by mouth daily. (Patient taking differently: Take 40 mg by mouth every evening.) 90 tablet 3   No facility-administered medications prior to visit.     Review of Systems:   Constitutional: No weight loss or gain, night sweats, fevers, chills, fatigue, or lassitude. HEENT: No difficulty swallowing, tooth/dental problems, or sore throat. No sneezing, itching, ear ache,  nasal congestion, or post nasal drip. +hard of hearing, mild aphasia, headaches CV:  +swelling in lower extremities (stable). No chest pain, orthopnea, PND, anasarca, dizziness, palpitations, syncope Resp: No shortness of breath with exertion or at rest. No excess mucus or change in color of mucus. No productive or non-productive. No hemoptysis. No wheezing.  No chest wall deformity Skin: No rash, lesions, ulcerations MSK:  No joint pain or swelling.  No decreased range of motion.  No back pain. Neuro: No dizziness or lightheadedness.  Psych: No depression or anxiety. Mood stable.     Physical Exam:  BP (!) 98/58   Pulse (!) 57   Ht '5\' 9"'$  (1.753 m)   Wt 216 lb 3.2 oz (98.1 kg)   SpO2 97% Comment: RA  BMI 31.93 kg/m   GEN: Pleasant, interactive, well-appearing; obese; in no acute distress. HEENT:  Normocephalic and atraumatic. PERRLA. Sclera white. Nasal turbinates pink, moist and patent bilaterally. No rhinorrhea present. Oropharynx pink and moist, without exudate or edema. No lesions, ulcerations, or postnasal drip.  NECK:  Supple w/ fair ROM. No JVD present.  CV: RRR, no m/r/g, no peripheral edema. Pulses intact, +2 bilaterally. No cyanosis, pallor or clubbing. PULMONARY:  Unlabored, regular breathing. Clear bilaterally A&P w/o wheezes/rales/rhonchi. No accessory muscle use. No dullness to percussion. GI: BS present and normoactive. Soft, non-tender to palpation.  Neuro: A/Ox3. No focal deficits noted.   Skin: Warm, no lesions or rashe Psych: Normal affect and behavior. Judgement and thought content appropriate.     Lab Results:  CBC    Component Value Date/Time   WBC 7.7 06/26/2021 0212   RBC 4.93 06/26/2021 0212   HGB 15.0 06/26/2021 0212   HGB 13.4 04/10/2019 1300   HGB 14.1 03/30/2016 1507   HCT 43.7 06/26/2021 0212   HCT 40.0 03/30/2016 1507   PLT 227 06/26/2021 0212   PLT 248 04/10/2019 1300   PLT 285 03/30/2016 1507   MCV 88.6 06/26/2021 0212   MCV 89  03/30/2016 1507   MCH 30.4 06/26/2021 0212   MCHC  34.3 06/26/2021 0212   RDW 13.5 06/26/2021 0212   RDW 12.9 03/30/2016 1507   LYMPHSABS 1.7 06/24/2021 1940   LYMPHSABS 2.0 03/30/2016 1507   MONOABS 0.7 06/24/2021 1940   EOSABS 0.2 06/24/2021 1940   EOSABS 0.3 03/30/2016 1507   BASOSABS 0.1 06/24/2021 1940   BASOSABS 0.1 03/30/2016 1507    BMET    Component Value Date/Time   NA 138 06/26/2021 0212   NA 133 (L) 03/30/2016 1507   NA 138 09/23/2015 1020   K 3.9 06/26/2021 0212   K 4.3 03/30/2016 1507   K 4.4 09/23/2015 1020   CL 106 06/26/2021 0212   CL 99 03/30/2016 1507   CO2 23 06/26/2021 0212   CO2 25 03/30/2016 1507   CO2 25 09/23/2015 1020   GLUCOSE 170 (H) 06/26/2021 0212   GLUCOSE 128 09/23/2015 1020   BUN 25 (H) 06/26/2021 0212   BUN 30 (A) 04/15/2021 0000   BUN 28.4 (H) 09/23/2015 1020   CREATININE 1.51 (H) 06/26/2021 0212   CREATININE 1.63 (H) 09/22/2019 1119   CREATININE 1.3 09/23/2015 1020   CALCIUM 9.2 06/26/2021 0212   CALCIUM 10.1 03/30/2016 1507   CALCIUM 9.5 09/23/2015 1020   GFRNONAA 48 (L) 06/26/2021 0212   GFRNONAA 39 (L) 04/10/2019 1300   GFRNONAA 63 05/31/2012 0818   GFRAA 45 (L) 04/10/2019 1300   GFRAA 73 05/31/2012 0818    BNP No results found for: "BNP"   Imaging:  No results found.        No data to display          No results found for: "NITRICOXIDE"      Assessment & Plan:   OSA (obstructive sleep apnea) Excellent compliance. He is still having breakthrough events with AHI 11.4. Appears he is having more central events post stroke. He likes his current mask and feels like he doesn't have any issues with it so he does not want to change it; he did have some leaks on download. We will send him for CPAP titration; may need BiPAP. In the interim, we will adjust his pressures to 10-20cmH2O   Patient Instructions  Change CPAP pressures to auto 10-20 cmH2O. Continue to use it every night, minimum of 4-6 hours a night.   Change equipment every 30 days or as directed by DME. Wash your tubing with warm soap and water daily, hang to dry. Wash humidifier portion weekly.  Be aware of reduced alertness and do not drive or operate heavy machinery if experiencing this or drowsiness. Healthy weight management discussed.  Avoid or decrease alcohol consumption and medications that make you more sleepy, if possible. Notify if persistent daytime sleepiness occurs even with consistent use of CPAP.  CPAP titration study ordered today    Follow up after CPAP titration with Dr. Halford Montgomery or Katie Stanley Lyness,NP. If symptoms do not improve or worsen, please contact office for sooner follow up or seek emergency care.    Acute ischemic left MCA stroke ([place]) Recovering well. Residual headaches and some speech difficulties. He has speech therapy following him. He also follows with neurology.    I spent 32 minutes of dedicated to the care of this patient on the date of this encounter to include pre-visit review of records, face-to-face time with the patient discussing conditions above, post visit ordering of testing, clinical documentation with the electronic health record, making appropriate referrals as documented, and communicating necessary findings to members of the patients care team.  Clayton Bibles, NP  08/09/2021  Pt aware and understands NP's role.

## 2021-08-11 ENCOUNTER — Ambulatory Visit (INDEPENDENT_AMBULATORY_CARE_PROVIDER_SITE_OTHER): Payer: Medicare HMO | Admitting: *Deleted

## 2021-08-11 ENCOUNTER — Telehealth: Payer: Self-pay | Admitting: *Deleted

## 2021-08-11 ENCOUNTER — Telehealth: Payer: Self-pay | Admitting: Cardiology

## 2021-08-11 DIAGNOSIS — Z7901 Long term (current) use of anticoagulants: Secondary | ICD-10-CM

## 2021-08-11 DIAGNOSIS — Z952 Presence of prosthetic heart valve: Secondary | ICD-10-CM | POA: Diagnosis not present

## 2021-08-11 LAB — POCT INR: INR: 3.9 — AB (ref 2.0–3.0)

## 2021-08-11 NOTE — Telephone Encounter (Signed)
[facility] called asking if INR could be checked on 08/17/2021 instead of 08/18/2021. Informed them that would be fine and will update the coumadin clinic follow up book.

## 2021-08-11 NOTE — Patient Instructions (Signed)
Description   Spoke with Lolita Lenz Nurse and advised to hold today's dose then continue taking warfarin 1 tablet daily except for 1/2 tablet on Wednesdays. Continue consistency with leafy green intake. Recheck INR in 1 week by Home Health (has 2 more visits with them). Coumadin Clinic [phone] or [phone].

## 2021-08-11 NOTE — Telephone Encounter (Signed)
Received a call from [place] with [facility] calling to report patient's INR 3.9.Advised I will send to coumadin clinic.

## 2021-08-11 NOTE — Telephone Encounter (Signed)
Returned a call to [place], [facility] RN and gave instructions. Details are in the Anticoagulation Encounter from today.

## 2021-08-11 NOTE — Telephone Encounter (Signed)
[name] from [facility]

## 2021-08-17 ENCOUNTER — Ambulatory Visit (INDEPENDENT_AMBULATORY_CARE_PROVIDER_SITE_OTHER): Payer: Medicare HMO

## 2021-08-17 DIAGNOSIS — Z7901 Long term (current) use of anticoagulants: Secondary | ICD-10-CM | POA: Diagnosis not present

## 2021-08-17 DIAGNOSIS — Z952 Presence of prosthetic heart valve: Secondary | ICD-10-CM | POA: Diagnosis not present

## 2021-08-17 LAB — POCT INR: INR: 4.3 — AB (ref 2.0–3.0)

## 2021-08-17 NOTE — Patient Instructions (Signed)
Description   Spoke with Lolita Lenz Nurse and advised to hold today's dose then START taking warfarin 1 tablet daily except for 1/2 tablet on Mondays, Wednesdays, and Fridays. Continue consistency with leafy green intake. Recheck INR in 1 week by Home Health (has 1 more visit with them). Coumadin Clinic [phone] or [phone].

## 2021-08-19 DIAGNOSIS — E119 Type 2 diabetes mellitus without complications: Secondary | ICD-10-CM | POA: Diagnosis not present

## 2021-08-20 DIAGNOSIS — R0683 Snoring: Secondary | ICD-10-CM | POA: Diagnosis not present

## 2021-08-20 DIAGNOSIS — G4733 Obstructive sleep apnea (adult) (pediatric): Secondary | ICD-10-CM | POA: Diagnosis not present

## 2021-08-20 DIAGNOSIS — R0681 Apnea, not elsewhere classified: Secondary | ICD-10-CM | POA: Diagnosis not present

## 2021-08-20 DIAGNOSIS — I10 Essential (primary) hypertension: Secondary | ICD-10-CM | POA: Diagnosis not present

## 2021-08-25 ENCOUNTER — Ambulatory Visit (INDEPENDENT_AMBULATORY_CARE_PROVIDER_SITE_OTHER): Payer: Medicare HMO | Admitting: Cardiology

## 2021-08-25 DIAGNOSIS — Z5181 Encounter for therapeutic drug level monitoring: Secondary | ICD-10-CM

## 2021-08-25 DIAGNOSIS — Z7901 Long term (current) use of anticoagulants: Secondary | ICD-10-CM

## 2021-08-25 DIAGNOSIS — Z952 Presence of prosthetic heart valve: Secondary | ICD-10-CM

## 2021-08-25 LAB — POCT INR: INR: 3.4 — AB (ref 2.0–3.0)

## 2021-08-26 ENCOUNTER — Ambulatory Visit: Payer: Medicare HMO | Admitting: Psychology

## 2021-08-26 ENCOUNTER — Ambulatory Visit: Payer: Medicare HMO

## 2021-08-26 ENCOUNTER — Encounter: Payer: Self-pay | Admitting: Psychology

## 2021-08-26 DIAGNOSIS — E1165 Type 2 diabetes mellitus with hyperglycemia: Secondary | ICD-10-CM | POA: Insufficient documentation

## 2021-08-26 DIAGNOSIS — I63412 Cerebral infarction due to embolism of left middle cerebral artery: Secondary | ICD-10-CM

## 2021-08-26 DIAGNOSIS — I6781 Acute cerebrovascular insufficiency: Secondary | ICD-10-CM | POA: Diagnosis not present

## 2021-08-26 DIAGNOSIS — H903 Sensorineural hearing loss, bilateral: Secondary | ICD-10-CM

## 2021-08-26 DIAGNOSIS — R4189 Other symptoms and signs involving cognitive functions and awareness: Secondary | ICD-10-CM | POA: Diagnosis not present

## 2021-08-26 DIAGNOSIS — Z952 Presence of prosthetic heart valve: Secondary | ICD-10-CM | POA: Insufficient documentation

## 2021-08-26 NOTE — Progress Notes (Signed)
   Psychometrician Note   Cognitive testing was administered to [name] by Cruzita Lederer, B.S. (psychometrist) under the supervision of Dr. Christia Reading, Ph.D., licensed psychologist on 08/26/2021. [name] did not appear overtly distressed by the testing session per behavioral observation or responses across self-report questionnaires. Rest breaks were offered.    The battery of tests administered was selected by Dr. Christia Reading, Ph.D. with consideration to [name]'s current level of functioning, the nature of his symptoms, emotional and behavioral responses during interview, level of literacy, observed level of motivation/effort, and the nature of the referral question. This battery was communicated to the psychometrist. Communication between Dr. Christia Reading, Ph.D. and the psychometrist was ongoing throughout the evaluation and Dr. Christia Reading, Ph.D. was immediately accessible at all times. Dr. Christia Reading, Ph.D. provided supervision to the psychometrist on the date of this service to the extent necessary to assure the quality of all services provided.    [name] will return within approximately 1-2 weeks for an interactive feedback session with Dr. Melvyn Novas at which time his test performances, clinical impressions, and treatment recommendations will be reviewed in detail. [name] understands he can contact our office should he require our assistance before this time.  A total of 160 minutes of billable time were spent face-to-face with [name] by the psychometrist. This includes both test administration and scoring time. Billing for these services is reflected in the clinical report generated by Dr. Christia Reading, Ph.D.  This note reflects time spent with the psychometrician and does not include test scores or any clinical interpretations made by Dr. Melvyn Novas. The full report will follow in a separate note.

## 2021-08-26 NOTE — Progress Notes (Signed)
NEUROPSYCHOLOGICAL EVALUATION David. Coal Montgomery Department of Neurology  Date of Evaluation: August 26, 2021  Reason for Referral:   [name] is a 76 y.o. right-handed Caucasian male referred by  Sharene Butters, PA-C , to characterize his current cognitive functioning and assist with diagnostic clarity and treatment planning in the context of subjective cognitive decline in the context of a prior left MCA embolic infarct.   Assessment and Plan:   Clinical Impression(s): [name]'s pattern of performance is suggestive of neuropsychological functioning largely within normal limits relative to age-matched peers. He exhibited mild performance variability across executive functioning and semantic fluency. Specific to the latter, all other assessments of expressive language were strong. Performances were likewise appropriate across processing speed, attention/concentration, safety/judgment, receptive language, visuospatial abilities, and all aspects of learning and memory. [name] denied difficulties completing instrumental activities of daily living (ADLs) independently. He does not warrant consideration for a neurocognitive disorder diagnostic classification at the present time.   Given [name]'s handedness and the location of his May 2023 infarct (left MCA with frontal cortex involvement), some weaknesses in executive functioning and expressive language would be an expected finding. Current scores reflect an expected pattern of weakness and are likely directly related to this event. Relative to his wife's pre-stroke concerns surrounding memory decline, day-to-day dysfunction can likely be accounted for by significant and largely uncorrected hearing loss. When using an amplification device, current memory scores were quite strong and not suggestive of any ongoing memory impairment or early concerns for an illness such as Alzheimer's disease. Likewise, his cognitive  and behavioral profile is also not suggestive of any other form of neurodegenerative illness presently.  Recommendations: It is possible that he could see continued improvements in his cognitive abilities given that his stroke occurred only two months prior to the current evaluation. Repeat testing for comparison purposes could certainly be considered in the future. This would be far more important should he and his wife report progressive decline in functioning.   Audiologic correction in the form of 100% adherence to hearing aid intervention is strongly recommended. If he does not have hearing aids, he should be referred for an audiologic evaluation so that these devices can be obtained.   [name] is encouraged to attend to lifestyle factors for brain health (e.g., regular physical exercise, good nutrition habits, regular participation in cognitively-stimulating activities, and general stress management techniques), which are likely to have benefits for both emotional adjustment and cognition. In fact, in addition to promoting good general health, regular exercise incorporating aerobic activities (e.g., brisk walking, jogging, cycling, etc.) has been demonstrated to be a very effective treatment for depression and stress, with similar efficacy rates to both antidepressant medication and psychotherapy. Optimal control of vascular risk factors (including safe cardiovascular exercise and adherence to dietary recommendations) is encouraged. Likewise, continued compliance with his CPAP machine will also be important. Continued participation in activities which provide mental stimulation and social interaction is also recommended.  Review of Records:   [name] was seen by [facility] Sharene Butters, [place]) on 12/28/2020 for an evaluation of memory loss. At that time, memory dysfunction was reported for the past six months. His wife described him repeating the same questions, as well as trouble  recalling names and other details as quickly. She noted a time while driving where he briefly forgot why they were going a particular direction, as well as a more broad decline in navigational abilities. ADLs were described as  intact. Performance on a brief cognitive screening instrument (MOCA) was 29/30. Ultimately, [name] was referred for a comprehensive neuropsychological evaluation to characterize his cognitive abilities and to assist with diagnostic clarity and treatment planning.   He was seen in the ED on 06/24/2021 with ongoing expressive aphasia. It was found that he had experienced a left MCA embolic infarct despite being within a therapeutic range for Coumadin. He was discharged home on 06/26/2021 with outpatient services including speech therapy.  He was most recently seen by Ms. Wertman on 07/26/2021 for follow-up. He reported expected gradual recovery in expressive language deficits following his left-sided stroke. Memory concerns were said to be stable relative to his pre-stroke baseline.   Head CT on 12/27/2020 revealed a small chronic infarct within the left cerebellar hemisphere, as well as mild generalized atrophy. Brain MRI on 06/25/2021 revealed a small to moderate left MCA branch infarction involving the left insular and frontal cortices, as well as mild generalized atrophy and mild microvascular ischemic disease.   Past Medical History:  Diagnosis Date   Actinic keratosis 02/14/2011   Allergy    Aortic stenosis    Arthritis    Bilateral sensorineural hearing loss 06/16/2009   CAD (coronary artery disease)    cabg   Complication of anesthesia    Coronary atherosclerosis of native coronary artery 06/15/2009   Diabetic polyneuropathy 11/11/2018   Dyslipidemia 14/78/2956   Embolic stroke involving left middle cerebral artery  05/30/2021   Erectile dysfunction 05/18/2009   Essential hypertension 06/16/2009   Expressive aphasia 06/25/2021   External hemorrhoids 03/21/2010    Gout 04/09/2015   Heart murmur    Hemochromatosis 02/14/2011   History of aortic valve replacement 10/13/2009   History of CABG 11/05/2018   2005- 1. Coronary artery bypass grafting x4 with LIMA-LAD, SVG-OM1, SVG-AM and dCFX.  2. Aortic valve replacement with [facility] AVR 3. Septal myomectomy.  4. Myoview low risk 2016   History of hepatitis 01/31/1972   Hypertriglyceridemia 06/13/2010   Hypertrophic obstructive cardiomyopathy 05/06/2008   SP septal myomectomy at surgery 2005 Echo Nov 2018- Hyperdynamic LVEF with severe basal septal hypertrophy. There is chordal SAM with resting gradient of 16 mmHg that increases to 85 mmHg with Valsalva        Leg weakness, bilateral 01/18/2021   Long term (current) use of anticoagulants 05/22/2018   Lumbar stenosis with neurogenic claudication 02/04/2021   Malignant neoplasm of prostate 06/17/2009   Mechanical heart valve present    Manufacturer: Sherlean Foot #: [id number]  Model #: [id number]. Card states MRI compatible with 3 teslas or less.   Obesity, unspecified 04/29/2008   OSA (obstructive sleep apnea) 07/12/2012   moderate-severe; uses CPAP nightly   PONV (postoperative nausea and vomiting)    after CABG- slow to wake up   Presence of prosthetic heart valve    Primary osteoarthritis of left shoulder 10/31/2018   S/P lumbar laminectomy 02/04/2021   Stage 3 chronic kidney disease due to type 2 diabetes mellitus 11/11/2018   Tobacco use 06/16/2009   Type 2 diabetes mellitus with hyperglycemia    Ventral hernia 03/03/2014    Past Surgical History:  Procedure Laterality Date   AORTIC VALVE REPLACEMENT  11/14/2003   [facility]   APPENDECTOMY  1990   CHOLECYSTECTOMY  1990   CORONARY ARTERY BYPASS GRAFT  10/2003   [place]   right, inguinal   HERNIA REPAIR  2002   left, inguinal   HIP SURGERY  2006   right hip   LUMBAR LAMINECTOMY/DECOMPRESSION MICRODISCECTOMY Bilateral 02/04/2021   Procedure: Bilateral Lumbar Two-Three  Laminectomy;  Surgeon: Kristeen Miss, MD;  Location: [place];  Service: Neurosurgery;  Laterality: Bilateral;  3C/RM 20   PILONIDAL CYST EXCISION  1964   prostate seed implant  3/12   TONSILLECTOMY  childhood    Current Outpatient Medications:    AMBULATORY NON FORMULARY MEDICATION, cpap cushions            AirFit F20 (Size: Large)           AirFit F30 (Size: Med)   Please FAX the prescription to:  [phone], Disp: 12 each, Rfl: prn   aspirin EC 81 MG tablet, Take 1 tablet (81 mg total) by mouth daily. Swallow whole., Disp: , Rfl:    betamethasone dipropionate (DIPROLENE) 0.05 % cream, Apply topically 2 (two) times daily as needed. To eczema rash, Disp: 30 g, Rfl: 1   Blood Glucose Monitoring Suppl Supplies MISC, Use for monitoring glucose level, Disp: 100 each, Rfl: 1   colchicine 0.6 MG tablet, TAKE 2 TABS BY MOUTH NOW AND THEN 1 TAB IN 1 HOUR FOR GOUT. (MAX 3 TABS/24 HRS), Disp: 9 tablet, Rfl: 1   Continuous Blood Gluc Sensor (DEXCOM G6 SENSOR) MISC, 1 Device by Does not apply route as directed., Disp: 9 each, Rfl: 3   Continuous Blood Gluc Transmit (DEXCOM G6 TRANSMITTER) MISC, 1 Device by Does not apply route as directed., Disp: 1 each, Rfl: 3   dapagliflozin propanediol (FARXIGA) 10 MG TABS tablet, Take 1 tablet (10 mg total) by mouth daily before breakfast., Disp: 90 tablet, Rfl: 3   enoxaparin (LOVENOX) 100 MG/ML injection, Inject 1 mL (100 mg total) into the skin every 12 (twelve) hours for 3 days. (Patient not taking: Reported on 07/12/2021), Disp: 6 mL, Rfl: 0   fenofibrate (TRICOR) 145 MG tablet, TAKE 1 TABLET BY MOUTH EVERY DAY, Disp: 90 tablet, Rfl: 1   furosemide (LASIX) 20 MG tablet, TAKE 1 TABLET BY MOUTH EVERY DAY AS NEEDED FOR SWELLING, Disp: 90 tablet, Rfl: 1   glucose blood (ONETOUCH VERIO) test strip, Use as instructed, Disp: 100 strip, Rfl: 12   insulin aspart (NOVOLOG FLEXPEN) 100 UNIT/ML FlexPen, Max daily 70 units (Patient taking differently: Using sliding scale, pts  spouse unsure of dosage), Disp: 75 mL, Rfl: 3   Insulin Glargine (BASAGLAR KWIKPEN) 100 UNIT/ML, Inject 24 Units into the skin daily., Disp: 30 mL, Rfl: 3   Insulin Pen Needle 32G X 4 MM MISC, 1 Device by Does not apply route in the morning, at noon, in the evening, and at bedtime., Disp: 400 each, Rfl: 3   metoprolol succinate (TOPROL-XL) 25 MG 24 hr tablet, TAKE 1 TABLET BY MOUTH EVERY DAY, Disp: 90 tablet, Rfl: 1   OneTouch Delica Lancets 62B MISC, USE AS DIRECTED, Disp: 100 each, Rfl: 1   rosuvastatin (CRESTOR) 40 MG tablet, Take 1 tablet (40 mg total) by mouth daily. (Patient taking differently: Take 40 mg by mouth every evening.), Disp: 90 tablet, Rfl: 3   tacrolimus (PROTOPIC) 0.1 % ointment, Apply 1 application  topically daily as needed (irritation)., Disp: , Rfl:    warfarin (COUMADIN) 7.5 MG tablet, Take 1/2 a tablet to 1 tablet by mouth daily as directed by the coumadin clinic, Disp: 35 tablet, Rfl: 2  Clinical Interview:   The following information was obtained during a clinical interview with [name] and his wife prior to cognitive testing.  Cognitive Symptoms:  Decreased short-term memory: Endorsed. Prior to his MCA infarct, his wife reported concerns surrounding [name] asking repetitive questions and having trouble recalling details of past conversations. However, she acknowledged that she was unclear if this was related to hearing loss or true memory impairment. [name] and his wife did acknowledge a modest decline in memory functioning in the acute stages of his infarct. However, these have recovered over time and he did not report significant difficulties at the present time.  Decreased long-term memory: Denied. Decreased attention/concentration: Denied. Reduced processing speed: Denied. Difficulties with executive functions: Denied. Difficulties with emotion regulation: Denied. Difficulties with receptive language: Denied. Difficulties with word finding: Endorsed. In  the acute aftermath of his infarct, he did exhibit expressive aphasia. However, this has improved over time. Currently, he reported the ongoing presence of word finding difficulties but is largely able to converse with others without significant difficulty.  Decreased visuoperceptual ability: Denied.  Difficulties completing ADLs: Denied. His wife reported that he resumed driving behaviors about two weeks prior to the current evaluation and reported no safety concerns.   Additional Medical History: History of traumatic brain injury/concussion: Denied. History of stroke: Endorsed (see above).  History of seizure activity: Denied. History of known exposure to toxins: Denied. Symptoms of chronic pain: Denied. Experience of frequent headaches/migraines: Denied. Frequent instances of dizziness/vertigo: Denied.  Sensory changes: He wears glasses with benefit. He also described significant hearing loss. Other sensory changes/difficulties (e.g., taste or smell) were denied.  Balance/coordination difficulties: Denied. Other motor difficulties: Endorsed. He reported slight tremors, generally while performing fine motor tasks (e.g., holding an eating utensil).  Sleep History: Estimated hours obtained each night: 7 hours.  Difficulties falling asleep: Denied. Difficulties staying asleep: Denied. Feels rested and refreshed upon awakening: Endorsed.  History of snoring: Endorsed. History of waking up gasping for air: Endorsed. Witnessed breath cessation while asleep: Endorsed. He has a history of moderate to severe sleep apnea and reported using his CPAP machine nightly.   History of vivid dreaming: Denied. Excessive movement while asleep: Denied. Instances of acting out his dreams: Denied.  Psychiatric/Behavioral Health History: Depression: He described his current mood as "good." While he acknowledged frustrations surrounding cognitive and functional changes stemming from his MCA infarct, he  also had a positive perspective and was able to appreciate his ongoing recovery. His wife noted that he may be a bit more irritable at times. He denied to his knowledge ever being formally diagnosed with a depressive mental health condition. Current or remote suicidal ideation, intent, or plan was denied. Anxiety: Denied. Mania: Denied. Trauma History: Denied. Visual/auditory hallucinations: Denied. Delusional thoughts: Denied.  Tobacco: Denied. Alcohol: He denied current alcohol consumption as well as a history of problematic alcohol abuse or dependence.  Recreational drugs: Denied.  Family History: Problem Relation Age of Onset   Heart disease Mother    Stroke Mother    Heart disease Father    Asperger's syndrome Son    Hyperlipidemia Son    Coronary artery disease Brother    Cancer Neg Hx        negative for colon cancer   This information was confirmed by Mr. Lacock.  Academic/Vocational History: Highest level of educational attainment: 18 years. 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 In earlier academic settings, he described himself as "lazy" and was largely a high C student. No relative weaknesses were identified.  History of developmental delay: Denied. History  of grade repetition: Denied. Enrollment in special education courses: Denied. History of LD/ADHD: Denied.  Employment: Retired. 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   Evaluation Results:   Behavioral Observations: [name] was accompanied by his wife, arrived to his appointment on time, and was appropriately dressed and groomed. He appeared alert and oriented. Observed gait and station were within normal limits. Gross motor functioning appeared intact upon informal observation and no abnormal movements (e.g., tremors) were noted. His  affect was generally relaxed and positive, but did range appropriately given the subject being discussed during the clinical interview or the task at hand during testing procedures. Spontaneous speech was fluent. Mild word finding difficulties were observed. Thought processes were coherent, organized, and normal in content. Hearing loss was apparent. Insight into his cognitive difficulties appeared adequate.   Given ongoing hearing loss, an amplification device (i.e., Pocket Talker) was utilized to lessen hearing impairment as a testing confound. During testing, sustained attention was appropriate. Task engagement was adequate and he persisted when challenged. Word finding difficulties were present and did appear to mildly impact language-based testing. Overall, [name] was cooperative with the clinical interview and subsequent testing procedures.   Adequacy of Effort: The validity of neuropsychological testing is limited by the extent to which the individual being tested may be assumed to have exerted adequate effort during testing. [name] expressed his intention to perform to the best of his abilities and exhibited adequate task engagement and persistence. Scores across stand-alone and embedded performance validity measures were within expectation. As such, the results of the current evaluation are believed to be a valid representation of [name] current cognitive functioning.  Test Results: [name] was largely oriented at the time of the current evaluation. A single point was lost for him incorrectly stating his age ("64").   Intellectual abilities based upon educational and vocational attainment were estimated to be in the average to above average range. Premorbid abilities were estimated to be within the above average range based upon a single-word reading test.   Processing speed was average to above average. Basic attention was above average to well above average. More complex  attention (e.g., working memory) was average to exceptionally high. Executive functioning was variable, ranging from the well below average to above average normative ranges. He did perform in the average range across a task assessing safety and judgment.  Assessed receptive language abilities were above average. Likewise, [name] did not exhibit any difficulties comprehending task instructions and answered all questions asked of him appropriately. Assessed expressive language (e.g., verbal fluency and confrontation naming) was slightly variable but largely in the average to above average normative ranges. He did perform in the well below average range across one task assessing semantic fluency.     Assessed visuospatial/visuoconstructional abilities were average to well above average.    Learning (i.e., encoding) of novel verbal information was average to above average. Spontaneous delayed recall (i.e., retrieval) of previously learned information was also average to above average. Performance across recognition tasks was strong, suggesting evidence for information consolidation.   Results of emotional screening instruments suggested that recent symptoms of generalized anxiety were in the minimal range, while symptoms of depression were within normal limits. A screening instrument assessing recent sleep quality suggested the presence of minimal sleep dysfunction.  Tables of Scores:   Note: This summary of test scores accompanies the interpretive report and should not be considered in isolation without reference  to the appropriate sections in the text. Descriptors are based on appropriate normative data and may be adjusted based on clinical judgment. Terms such as "Within Normal Limits" and "Outside Normal Limits" are used when a more specific description of the test score cannot be determined.       Percentile - Normative Descriptor > 98 - Exceptionally High 91-97 - Well Above Average 75-90 -  Above Average 25-74 - Average 9-24 - Below Average 2-8 - Well Below Average < 2 - Exceptionally Low       Orientation:      Raw Score Percentile   NAB Orientation, Form 1 28/29 --- ---       Cognitive Screening:      Raw Score Percentile   SLUMS: 27/30 --- ---       RBANS, Form A: Standard Score/ Scaled Score Percentile   Total Score 113 81 Above Average  Immediate Memory 112 79 Above Average    List Learning 11 63 Average    Story Memory 13 84 Above Average  Visuospatial/Constructional 126 96 Well Above Average    Figure Copy 14 91 Well Above Average    Line Orientation 19/20 >75 Above Average  Language 88 21 Below Average    Picture Naming 10/10 51-75 Average    Semantic Fluency 5 5 Well Below Average  Attention 112 79 Above Average    Digit Span 12 75 Above Average    Coding 12 75 Above Average  Delayed Memory 110 75 Above Average    List Recall 6/10 51-75 Average    List Recognition 20/20 51-75 Average    Story Recall 12 75 Above Average    Story Recognition 12/12 69+ Average    Figure Recall 11 63 Average    Figure Recognition 8/8 70+ Average        Intellectual Functioning:      Standard Score Percentile   Test of Premorbid Functioning: 111 77 Above Average       Attention/Executive Function:     Trail Making Test (TMT): Raw Score (T Score) Percentile     Part A 42 secs., 1 error (43) 25 Average    Part B 103 secs.,  2 errors (47) 38 Average         Scaled Score Percentile   WAIS-IV Digit Span: 15 95 Well Above Average    Forward 15 95 Well Above Average    Backward 17 99 Exceptionally High    Sequencing 11 63 Average        Scaled Score Percentile   WAIS-IV Similarities: 12 75 Above Average       D-KEFS Color-Word Interference Test: Raw Score (Scaled Score) Percentile     Color Naming 40 secs. (7) 16 Below Average    Word Reading 32 secs. (7) 16 Below Average    Inhibition 105 secs. (5) 5 Well Below Average      Total Errors 4 errors (9) 37  Average    Inhibition/Switching 65 secs. (12) 75 Above Average      Total Errors 0 errors (13) 84 Above Average       D-KEFS Verbal Fluency Test: Raw Score (Scaled Score) Percentile     Letter Total Correct 37 (11) 63 Average    Category Total Correct 44 (14) 91 Well Above Average    Category Switching Total Correct 8 (5) 5 Well Below Average    Category Switching Accuracy 7 (6) 9 Below Average      Total Set  Loss Errors 0 (13) 84 Above Average      Total Repetition Errors 1 (12) 75 Above Average       NAB Executive Functions Module, Form 1: T Score Percentile     Judgment 49 46 Average       Language:      Raw Score Percentile   Sentence Repetition: 14/22 27 Average       Verbal Fluency Test: Raw Score  (T Score) Percentile     Phonemic Fluency (FAS) 37 (43) 25 Average    Animal Fluency 24 (59) 82 Above Average        NAB Language Module, Form 1: Standard Score/ T Score Percentile   Total Score 115 84 Above Average    Oral Production 50 50 Average    Auditory Comprehension 57 75 Above Average    Reading Comprehension 13/13 --- Within Normal Limits    Naming 31/31 (59) 82 Above Average    Writing 56 73 Average    Bill Payment 55 69 Average       Visuospatial/Visuoconstruction:      Raw Score Percentile   Clock Drawing: 10/10 --- Within Normal Limits        Scaled Score Percentile   WAIS-IV Block Design: 14 91 Well Above Average       Mood and Personality:      Raw Score Percentile   Beck Depression Inventory - II: 0 --- Within Normal Limits  PROMIS Anxiety Questionnaire: 9 --- None to Slight       Additional Questionnaires:      Raw Score Percentile   PROMIS Sleep Disturbance Questionnaire: 16 --- None to Slight   Informed Consent and Coding/Compliance:   The current evaluation represents a clinical evaluation for the purposes previously outlined by the referral source and is in no way reflective of a forensic evaluation.   [name] was provided with a verbal  description of the nature and purpose of the present neuropsychological evaluation. Also reviewed were the foreseeable risks and/or discomforts and benefits of the procedure, limits of confidentiality, and mandatory reporting requirements of this provider. The patient was given the opportunity to ask questions and receive answers about the evaluation. Oral consent to participate was provided by the patient.   This evaluation was conducted by Christia Reading, Ph.D., ABPP-CN, board certified clinical neuropsychologist. Mr. Caterino completed a clinical interview with Dr. Melvyn Novas, billed as one unit [phone], and 160 minutes of cognitive testing and scoring, billed as one unit [phone] and four additional units [phone]. Psychometrist Cruzita Lederer, B.S., assisted Dr. Melvyn Novas with test administration and scoring procedures. As a separate and discrete service, Dr. Melvyn Novas spent a total of 160 minutes in interpretation and report writing billed as one unit [phone] and two units [id number].

## 2021-08-28 DIAGNOSIS — I10 Essential (primary) hypertension: Secondary | ICD-10-CM | POA: Diagnosis not present

## 2021-08-28 DIAGNOSIS — R0681 Apnea, not elsewhere classified: Secondary | ICD-10-CM | POA: Diagnosis not present

## 2021-08-28 DIAGNOSIS — R0683 Snoring: Secondary | ICD-10-CM | POA: Diagnosis not present

## 2021-08-28 DIAGNOSIS — G4733 Obstructive sleep apnea (adult) (pediatric): Secondary | ICD-10-CM | POA: Diagnosis not present

## 2021-08-29 ENCOUNTER — Encounter: Payer: Self-pay | Admitting: Internal Medicine

## 2021-08-30 DIAGNOSIS — G4733 Obstructive sleep apnea (adult) (pediatric): Secondary | ICD-10-CM | POA: Diagnosis not present

## 2021-08-30 DIAGNOSIS — I10 Essential (primary) hypertension: Secondary | ICD-10-CM | POA: Diagnosis not present

## 2021-09-01 ENCOUNTER — Ambulatory Visit (INDEPENDENT_AMBULATORY_CARE_PROVIDER_SITE_OTHER): Payer: Medicare HMO | Admitting: Cardiovascular Disease

## 2021-09-01 DIAGNOSIS — Z952 Presence of prosthetic heart valve: Secondary | ICD-10-CM | POA: Diagnosis not present

## 2021-09-01 DIAGNOSIS — Z7901 Long term (current) use of anticoagulants: Secondary | ICD-10-CM

## 2021-09-01 DIAGNOSIS — Z5181 Encounter for therapeutic drug level monitoring: Secondary | ICD-10-CM

## 2021-09-01 LAB — POCT INR: INR: 2.9 (ref 2.0–3.0)

## 2021-09-02 ENCOUNTER — Ambulatory Visit: Payer: Medicare HMO | Admitting: Psychology

## 2021-09-02 DIAGNOSIS — I63412 Cerebral infarction due to embolism of left middle cerebral artery: Secondary | ICD-10-CM

## 2021-09-02 DIAGNOSIS — H903 Sensorineural hearing loss, bilateral: Secondary | ICD-10-CM | POA: Diagnosis not present

## 2021-09-02 NOTE — Progress Notes (Signed)
   Neuropsychology Feedback Session Tillie Rung. [facility] of Neurology  Reason for Referral:   [name] is a 76 y.o. right-handed Caucasian male referred by  Sharene Butters, PA-C , to characterize his current cognitive functioning and assist with diagnostic clarity and treatment planning in the context of subjective cognitive decline in the context of a prior left MCA embolic infarct.   Feedback:   [name] completed a comprehensive neuropsychological evaluation on 08/26/2021. Please refer to that encounter for the full report and recommendations. Briefly, results suggested neuropsychological functioning largely within normal limits relative to age-matched peers. He exhibited mild performance variability across executive functioning and semantic fluency. Specific to the latter, all other assessments of expressive language were strong. Performances were likewise appropriate across processing speed, attention/concentration, safety/judgment, receptive language, visuospatial abilities, and all aspects of learning and memory. Given [name]'s handedness and the location of his May 2023 infarct (left MCA with frontal cortex involvement), some weaknesses in executive functioning and expressive language would be an expected finding. Current scores reflect an expected pattern of weakness and are likely directly related to this event. Relative to his wife's pre-stroke concerns surrounding memory decline, day-to-day dysfunction can likely be accounted for by significant and largely uncorrected hearing loss.   [name] was accompanied by his wife during the current feedback session. Content of the current session focused on the results of his neuropsychological evaluation. [name] was given the opportunity to ask questions and his questions were answered. He was encouraged to reach out should additional questions arise. A copy of his report was provided at the conclusion  of the visit.      22 minutes were spent conducting the current feedback session with [name], billed as one unit [phone].

## 2021-09-13 ENCOUNTER — Ambulatory Visit: Payer: Medicare HMO | Admitting: Physician Assistant

## 2021-09-15 ENCOUNTER — Encounter: Payer: Self-pay | Admitting: Internal Medicine

## 2021-09-15 ENCOUNTER — Ambulatory Visit: Payer: Medicare HMO | Admitting: *Deleted

## 2021-09-15 DIAGNOSIS — Z7901 Long term (current) use of anticoagulants: Secondary | ICD-10-CM

## 2021-09-15 DIAGNOSIS — Z952 Presence of prosthetic heart valve: Secondary | ICD-10-CM | POA: Diagnosis not present

## 2021-09-15 LAB — POCT INR: INR: 3.7 — AB (ref 2.0–3.0)

## 2021-09-15 NOTE — Patient Instructions (Signed)
Description   Today take 1/2 tablet then continue taking warfarin 1 tablet daily except for 1/2 tablet on Mondays, Wednesdays, and Fridays. Continue consistency with leafy green intake. Recheck INR in 2 weeks.  [facility] [phone] or [phone].

## 2021-09-19 DIAGNOSIS — E119 Type 2 diabetes mellitus without complications: Secondary | ICD-10-CM | POA: Diagnosis not present

## 2021-09-20 ENCOUNTER — Telehealth: Payer: Self-pay | Admitting: Family

## 2021-09-20 DIAGNOSIS — I10 Essential (primary) hypertension: Secondary | ICD-10-CM | POA: Diagnosis not present

## 2021-09-20 DIAGNOSIS — G4733 Obstructive sleep apnea (adult) (pediatric): Secondary | ICD-10-CM | POA: Diagnosis not present

## 2021-09-20 DIAGNOSIS — R0683 Snoring: Secondary | ICD-10-CM | POA: Diagnosis not present

## 2021-09-20 DIAGNOSIS — R0681 Apnea, not elsewhere classified: Secondary | ICD-10-CM | POA: Diagnosis not present

## 2021-09-20 NOTE — Telephone Encounter (Signed)
Speech therapist just wanted to document pt has been discharged from [facility], and has met all his goals.

## 2021-09-24 ENCOUNTER — Other Ambulatory Visit: Payer: Self-pay | Admitting: Cardiology

## 2021-09-24 ENCOUNTER — Other Ambulatory Visit: Payer: Self-pay | Admitting: Internal Medicine

## 2021-09-24 DIAGNOSIS — E118 Type 2 diabetes mellitus with unspecified complications: Secondary | ICD-10-CM

## 2021-09-28 ENCOUNTER — Ambulatory Visit: Payer: Medicare HMO | Attending: Cardiology

## 2021-09-28 DIAGNOSIS — Z952 Presence of prosthetic heart valve: Secondary | ICD-10-CM | POA: Diagnosis not present

## 2021-09-28 DIAGNOSIS — Z7901 Long term (current) use of anticoagulants: Secondary | ICD-10-CM

## 2021-09-28 LAB — POCT INR: INR: 3.6 — AB (ref 2.0–3.0)

## 2021-09-28 NOTE — Patient Instructions (Signed)
Description   Eat greens today and continue taking warfarin 1 tablet daily except for 1/2 tablet on Mondays, Wednesdays, and Fridays. Continue consistency with leafy green intake. Recheck INR in 3 weeks.  [facility] [phone] or [phone].

## 2021-09-30 DIAGNOSIS — G4733 Obstructive sleep apnea (adult) (pediatric): Secondary | ICD-10-CM | POA: Diagnosis not present

## 2021-09-30 DIAGNOSIS — I10 Essential (primary) hypertension: Secondary | ICD-10-CM | POA: Diagnosis not present

## 2021-10-04 ENCOUNTER — Ambulatory Visit (HOSPITAL_BASED_OUTPATIENT_CLINIC_OR_DEPARTMENT_OTHER): Payer: Medicare HMO | Attending: Nurse Practitioner | Admitting: Pulmonary Disease

## 2021-10-04 ENCOUNTER — Telehealth: Payer: Self-pay | Admitting: Family

## 2021-10-04 ENCOUNTER — Ambulatory Visit (INDEPENDENT_AMBULATORY_CARE_PROVIDER_SITE_OTHER): Payer: Medicare HMO | Admitting: Family

## 2021-10-04 VITALS — BP 129/53 | HR 57 | Temp 98.1°F | Resp 16 | Wt 220.0 lb

## 2021-10-04 DIAGNOSIS — R4701 Aphasia: Secondary | ICD-10-CM

## 2021-10-04 DIAGNOSIS — E785 Hyperlipidemia, unspecified: Secondary | ICD-10-CM | POA: Diagnosis not present

## 2021-10-04 DIAGNOSIS — I10 Essential (primary) hypertension: Secondary | ICD-10-CM

## 2021-10-04 DIAGNOSIS — N183 Chronic kidney disease, stage 3 unspecified: Secondary | ICD-10-CM

## 2021-10-04 DIAGNOSIS — E1165 Type 2 diabetes mellitus with hyperglycemia: Secondary | ICD-10-CM | POA: Diagnosis not present

## 2021-10-04 DIAGNOSIS — M1A9XX0 Chronic gout, unspecified, without tophus (tophi): Secondary | ICD-10-CM

## 2021-10-04 DIAGNOSIS — E1122 Type 2 diabetes mellitus with diabetic chronic kidney disease: Secondary | ICD-10-CM

## 2021-10-04 DIAGNOSIS — I63412 Cerebral infarction due to embolism of left middle cerebral artery: Secondary | ICD-10-CM | POA: Diagnosis not present

## 2021-10-04 DIAGNOSIS — G4733 Obstructive sleep apnea (adult) (pediatric): Secondary | ICD-10-CM | POA: Diagnosis not present

## 2021-10-04 DIAGNOSIS — F17200 Nicotine dependence, unspecified, uncomplicated: Secondary | ICD-10-CM

## 2021-10-04 DIAGNOSIS — R69 Illness, unspecified: Secondary | ICD-10-CM | POA: Diagnosis not present

## 2021-10-04 LAB — BASIC METABOLIC PANEL
BUN: 32 mg/dL — ABNORMAL HIGH (ref 6–23)
CO2: 27 mEq/L (ref 19–32)
Calcium: 9.6 mg/dL (ref 8.4–10.5)
Chloride: 102 mEq/L (ref 96–112)
Creatinine, Ser: 1.38 mg/dL (ref 0.40–1.50)
GFR: 49.66 mL/min — ABNORMAL LOW (ref >60.00)
Glucose, Bld: 131 mg/dL — ABNORMAL HIGH (ref 70–99)
Potassium: 4.2 mEq/L (ref 3.5–5.1)
Sodium: 139 mEq/L (ref 135–145)

## 2021-10-04 LAB — MICROALBUMIN / CREATININE URINE RATIO
Creatinine,U: 32.1 mg/dL
Microalb Creat Ratio: 2.2 mg/g (ref 0.0–30.0)
Microalb, Ur: <0.7 mg/dL (ref 0.0–1.9)

## 2021-10-04 NOTE — Assessment & Plan Note (Signed)
Improved today.

## 2021-10-04 NOTE — Assessment & Plan Note (Signed)
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 He is maintained on coumadin per coumadin clinic as well as aspirin 81mg  and statin for secondary prevention.

## 2021-10-04 NOTE — Telephone Encounter (Signed)
Please call [facility] and request last DM eye.

## 2021-10-04 NOTE — Telephone Encounter (Signed)
Records release request faxed

## 2021-10-04 NOTE — Assessment & Plan Note (Signed)
Lab Results  Component Value Date   CHOL 143 06/25/2021   HDL 30 (L) 06/25/2021   LDLCALC 65 06/25/2021   LDLDIRECT 86.0 09/09/2020   TRIG 240 (H) 06/25/2021   CHOLHDL 4.8 06/25/2021   LDL at Goal on crestor '40mg'$ . Continue same.

## 2021-10-04 NOTE — Assessment & Plan Note (Signed)
Lab Results  Component Value Date   HGBA1C 8.3 (H) 06/25/2021   Pt is followed by endocrinology and has an appointment later this month.

## 2021-10-04 NOTE — Assessment & Plan Note (Addendum)
  Check follow up Cr/urine microalbumin.

## 2021-10-04 NOTE — Assessment & Plan Note (Signed)
States that he has completely quit smoking.  I commended him on this.

## 2021-10-04 NOTE — Assessment & Plan Note (Signed)
His word finding is almost back to baseline. He is maintained on coumadin per coumadin clinic as well as aspirin '81mg'$  and statin for secondary prevention.

## 2021-10-04 NOTE — Assessment & Plan Note (Signed)
No recent flare ups.   

## 2021-10-04 NOTE — Assessment & Plan Note (Signed)
BP Readings from Last 3 Encounters:  10/04/21 (!) 129/53  08/09/21 (!) 98/58  07/26/21 (!) 146/77   At goal, continue toprol xl.

## 2021-10-04 NOTE — Progress Notes (Signed)
Subjective:   By signing my name below, I, [name], attest that this documentation has been prepared under the direction and in the presence of Karie Chimera, NP 10/04/2021   Patient ID: [name], male    DOB: 1945/11/22, 76 y.o.   MRN: [id number]  Chief Complaint  Patient presents with   Hypertension    Here for follow up   Diabetes    Here for follow up    HPI Patient is in today for an office visit. He is accompanied by his partner.   Physical Therapy/Stroke: Since having his stroke, he has not been having physical therapy. However, he was doing speech therapy for about six weeks. His speech is improving but every now and then he has to stop his speech because he cannot find the words to say.  Cholesterol: Overall, his cholesterol levels are normal. However, his triglyceride levels are slightly elevating. He is currently taking 40 Mg of Crestor.  Lab Results  Component Value Date   CHOL 143 06/25/2021   HDL 30 (L) 06/25/2021   LDLCALC 65 06/25/2021   LDLDIRECT 86.0 09/09/2020   TRIG 240 (H) 06/25/2021   CHOLHDL 4.8 06/25/2021   Blood Pressure: As of today's visit, his blood pressure is normal BP Readings from Last 3 Encounters:  10/04/21 (!) 129/53  08/09/21 (!) 98/58  07/26/21 (!) 146/77   Pulse Readings from Last 3 Encounters:  10/04/21 (!) 57  08/09/21 (!) 57  07/26/21 60   Gout: His gout symptoms are controlled. He denies of any recent flare-ups.  Cpap: He regularly wearing his Cpap. He is scheduled to undergo a sleep study on 10/04/2021. Vision: He states that his last eye exam was around 03/2021 - 03/2021. He has another exam scheduled on 10/2021. Smoking: He reports that he is no longer smoking.  Endocrinology: He is regularly following up with Dr. Kelton Pillar and is scheduled to see them on 10/28/2021.  Health Maintenance Due  Topic Date Due   Zoster Vaccines- Shingrix (1 of 2) Never done   COVID-19 Vaccine (5 - Pfizer risk series)  01/24/2021   OPHTHALMOLOGY EXAM  03/25/2021   URINE MICROALBUMIN  06/02/2021    Past Medical History:  Diagnosis Date   Actinic keratosis 02/14/2011   Allergy    Aortic stenosis    Arthritis    Bilateral sensorineural hearing loss 06/16/2009   CAD (coronary artery disease)    cabg   Complication of anesthesia    Coronary atherosclerosis of native coronary artery 06/15/2009   Diabetic polyneuropathy 11/11/2018   Dyslipidemia 38/18/2993   Embolic stroke involving left middle cerebral artery  05/30/2021   Erectile dysfunction 05/18/2009   Essential hypertension 06/16/2009   Expressive aphasia 06/25/2021   External hemorrhoids 03/21/2010   Gout 04/09/2015   Heart murmur    Hemochromatosis 02/14/2011   History of aortic valve replacement 10/13/2009   History of CABG 11/05/2018   2005- 1. Coronary artery bypass grafting x4 with LIMA-LAD, SVG-OM1, SVG-AM and dCFX.  2. Aortic valve replacement with St Jude AVR 3. Septal myomectomy.  4. Myoview low risk 2016   History of hepatitis 01/31/1972   Hypertriglyceridemia 06/13/2010   Hypertrophic obstructive cardiomyopathy 05/06/2008   SP septal myomectomy at surgery 2005 Echo Nov 2018- Hyperdynamic LVEF with severe basal septal hypertrophy. There is chordal SAM with resting gradient of 16 mmHg that increases to 85 mmHg with Valsalva        Leg weakness, bilateral 01/18/2021   Long term (  current) use of anticoagulants 05/22/2018   Lumbar stenosis with neurogenic claudication 02/04/2021   Malignant neoplasm of prostate 06/17/2009   Mechanical heart valve present    Manufacturer: [employer]lean Foot #: [id number]  Model #: [id number]. Card states MRI compatible with 3 teslas or less.   Obesity, unspecified 04/29/2008   OSA (obstructive sleep apnea) 07/12/2012   moderate-severe; uses CPAP nightly   PONV (postoperative nausea and vomiting)    after CABG- slow to wake up   Presence of prosthetic heart valve    Primary osteoarthritis of left  shoulder 10/31/2018   S/P lumbar laminectomy 02/04/2021   Stage 3 chronic kidney disease due to type 2 diabetes mellitus 11/11/2018   Tobacco use 06/16/2009   Type 2 diabetes mellitus with hyperglycemia    Ventral hernia 03/03/2014    Past Surgical History:  Procedure Laterality Date   AORTIC VALVE REPLACEMENT  11/14/2003   St [facility]   APPENDECTOMY  1990   CHOLECYSTECTOMY  1990   CORONARY ARTERY BYPASS GRAFT  10/2003   Max   right, inguinal   HERNIA REPAIR  2002   left, inguinal   HIP SURGERY  2006   right hip   LUMBAR LAMINECTOMY/DECOMPRESSION MICRODISCECTOMY Bilateral 02/04/2021   Procedure: Bilateral Lumbar Two-Three Laminectomy;  Surgeon: Kristeen Miss, MD;  Location: [facility];  Service: Neurosurgery;  Laterality: Bilateral;  3C/RM 63   PILONIDAL CYST EXCISION  1964   prostate seed implant  3/12   TONSILLECTOMY  childhood    Family History  Problem Relation Age of Onset   Heart disease Mother    Stroke Mother    Heart disease Father    Asperger's syndrome Son    Hyperlipidemia Son    Coronary artery disease Brother    Cancer Neg Hx        negative for colon cancer    Social History   Socioeconomic History   Marital status: Married    Spouse name: Not on file   Number of children: Not on file   Years of education: 18   Highest education level: Master's degree (e.g., MA, MS, MEng, MEd, MSW, MBA)  Occupational History   Occupation: Retired  Tobacco Use   Smoking status: Former    Types: Cigars    Quit date: 06/2021    Years since quitting: 0.2   Smokeless tobacco: Never   Tobacco comments:    Pt states he smokes 1 cigar about once a month. 06/01/2021    Pt quit  Cigars 06/2021  Substance and Sexual Activity   Alcohol use: Not Currently    Comment: wine maybe 1 per month   Drug use: No   Sexual activity: Never  Other Topics Concern   Not on file  Social History Narrative   Right Handed    Lives in a one sotyr home. One step leads to  front door.    Social Determinants of Health   Financial Resource Strain: Not on file  Food Insecurity: Not on file  Transportation Needs: Not on file  Physical Activity: Not on file  Stress: Not on file  Social Connections: Not on file  Intimate Partner Violence: Not on file    Outpatient Medications Prior to Visit  Medication Sig Dispense Refill   AMBULATORY NON FORMULARY MEDICATION cpap cushions            AirFit F20 (Size: Large)           AirFit F30 (Size:  Med)   Please FAX the prescription to:  [phone] 12 each prn   aspirin EC 81 MG tablet Take 1 tablet (81 mg total) by mouth daily. Swallow whole.     betamethasone dipropionate (DIPROLENE) 0.05 % cream Apply topically 2 (two) times daily as needed. To eczema rash 30 g 1   Blood Glucose Monitoring Suppl Supplies MISC Use for monitoring glucose level 100 each 1   colchicine 0.6 MG tablet TAKE 2 TABS BY MOUTH NOW AND THEN 1 TAB IN 1 HOUR FOR GOUT. (MAX 3 TABS/24 HRS) 9 tablet 1   Continuous Blood Gluc Sensor (DEXCOM G6 SENSOR) MISC 1 Device by Does not apply route as directed. 9 each 3   Continuous Blood Gluc Transmit (DEXCOM G6 TRANSMITTER) MISC 1 Device by Does not apply route as directed. 1 each 3   dapagliflozin propanediol (FARXIGA) 10 MG TABS tablet Take 1 tablet (10 mg total) by mouth daily before breakfast. 90 tablet 3   fenofibrate (TRICOR) 145 MG tablet TAKE 1 TABLET BY MOUTH EVERY DAY 90 tablet 1   furosemide (LASIX) 20 MG tablet TAKE 1 TABLET BY MOUTH EVERY DAY AS NEEDED FOR SWELLING 90 tablet 1   glucose blood (ONETOUCH VERIO) test strip Use as instructed 100 strip 12   insulin aspart (NOVOLOG FLEXPEN) 100 UNIT/ML FlexPen Max daily 70 units (Patient taking differently: Using sliding scale, pts spouse unsure of dosage) 75 mL 3   Insulin Glargine (BASAGLAR KWIKPEN) 100 UNIT/ML INJECT 38 UNITS INTO THE SKIN DAILY. 30 mL 0   Insulin Pen Needle 32G X 4 MM MISC 1 Device by Does not apply route in the morning, at  noon, in the evening, and at bedtime. 400 each 3   metoprolol succinate (TOPROL-XL) 25 MG 24 hr tablet TAKE 1 TABLET BY MOUTH EVERY DAY 90 tablet 1   OneTouch Delica Lancets 40J MISC USE AS DIRECTED 100 each 1   tacrolimus (PROTOPIC) 0.1 % ointment Apply 1 application  topically daily as needed (irritation).     warfarin (COUMADIN) 7.5 MG tablet TAKE 1/2 A TABLET TO 1 TABLET BY MOUTH DAILY AS DIRECTED BY THE COUMADIN CLINIC 105 tablet 1   enoxaparin (LOVENOX) 100 MG/ML injection Inject 1 mL (100 mg total) into the skin every 12 (twelve) hours for 3 days. (Patient not taking: Reported on 07/12/2021) 6 mL 0   rosuvastatin (CRESTOR) 40 MG tablet Take 1 tablet (40 mg total) by mouth daily. (Patient taking differently: Take 40 mg by mouth every evening.) 90 tablet 3   No facility-administered medications prior to visit.    No Known Allergies  ROS See HPI    Objective:    Physical Exam Constitutional:      General: He is not in acute distress.    Appearance: Normal appearance. He is not ill-appearing.  HENT:     Head: Normocephalic and atraumatic.     Right Ear: External ear normal.     Left Ear: External ear normal.  Eyes:     Extraocular Movements: Extraocular movements intact.     Pupils: Pupils are equal, round, and reactive to light.  Cardiovascular:     Rate and Rhythm: Normal rate and regular rhythm.     Heart sounds: Normal heart sounds. No murmur heard.    No gallop.     Comments: Mechanical valve click Pulmonary:     Effort: Pulmonary effort is normal. No respiratory distress.     Breath sounds: Normal breath sounds. No wheezing or rales.  Musculoskeletal:     Right lower leg: 2+ Pitting Edema present.     Left lower leg: 2+ Pitting Edema present.  Skin:    General: Skin is warm and dry.  Neurological:     Mental Status: He is alert and oriented to person, place, and time.  Psychiatric:        Mood and Affect: Mood normal.        Behavior: Behavior normal.         Judgment: Judgment normal.     BP (!) 129/53 (BP Location: Right Arm, Patient Position: Sitting, Cuff Size: Large)   Pulse (!) 57   Temp 98.1 F (36.7 C) (Oral)   Resp 16   Wt 220 lb (99.8 kg)   SpO2 98%   BMI 32.49 kg/m  Wt Readings from Last 3 Encounters:  10/04/21 220 lb (99.8 kg)  08/09/21 216 lb 3.2 oz (98.1 kg)  07/26/21 213 lb (96.6 kg)   Diabetic Foot Exam - Simple   Simple Foot Form Diabetic Foot exam was performed with the following findings: Yes 10/04/2021 10:17 AM  Visual Inspection No deformities, no ulcerations, no other skin breakdown bilaterally: Yes Sensation Testing Intact to touch and monofilament testing bilaterally: Yes Pulse Check Posterior Tibialis and Dorsalis pulse intact bilaterally: Yes Comments        Assessment & Plan:   Problem List Items Addressed This Visit       Unprioritized   Essential hypertension (Chronic)    BP Readings from Last 3 Encounters:  10/04/21 (!) 129/53  08/09/21 (!) 98/58  07/26/21 (!) 146/77  At goal, continue toprol xl.        Dyslipidemia (Chronic)    Lab Results  Component Value Date   CHOL 143 06/25/2021   HDL 30 (L) 06/25/2021   LDLCALC 65 06/25/2021   LDLDIRECT 86.0 09/09/2020   TRIG 240 (H) 06/25/2021   CHOLHDL 4.8 06/25/2021  LDL at Goal on crestor '40mg'$ . Continue same.      Type 2 diabetes mellitus with hyperglycemia - Primary    Lab Results  Component Value Date   HGBA1C 8.3 (H) 06/25/2021  Pt is followed by endocrinology and has an appointment later this month.       Relevant Orders   Urine Microalbumin w/creat. ratio   Basic metabolic panel   Tobacco use    States that he has completely quit smoking.  I commended him on this.       Stage 3 chronic kidney disease due to type 2 diabetes mellitus     Check follow up Cr/urine microalbumin.       OSA (obstructive sleep apnea)    Has sleep study tonight at [facility].  Reports good compliance.      Gout    No recent flare ups.        Expressive aphasia    Improved today.       Embolic stroke involving left middle cerebral artery     His word finding is almost back to baseline. He is maintained on coumadin per coumadin clinic as well as aspirin '81mg'$  and statin for secondary prevention.       No orders of the defined types were placed in this encounter.   I, Nance Pear, NP, personally preformed the services described in this documentation.  All medical record entries made by the scribe were at my direction and in my presence.  I have reviewed the chart and discharge instructions (if applicable) and agree  that the record reflects my personal performance and is accurate and complete. 10/04/2021   I,Amber Collins,acting as a scribe for Nance Pear, NP.,have documented all relevant documentation on the behalf of Nance Pear, NP,as directed by  Nance Pear, NP while in the presence of Nance Pear, NP.    Nance Pear, NP

## 2021-10-04 NOTE — Assessment & Plan Note (Signed)
Has sleep study tonight at [facility].  Reports good compliance.

## 2021-10-05 ENCOUNTER — Telehealth: Payer: Self-pay | Admitting: Pulmonary Disease

## 2021-10-05 DIAGNOSIS — G4733 Obstructive sleep apnea (adult) (pediatric): Secondary | ICD-10-CM

## 2021-10-05 NOTE — Procedures (Signed)
     Patient Name: [name], [name] Date: 10/04/2021 Gender: Male D.O.B: February 19, 1945 Age (years): 21 Referring Provider: Clayton Bibles NP Height (inches): 50 Interpreting Physician: Chesley Mires MD, ABSM Weight (lbs): 218 RPSGT: Laren Everts BMI: 32 MRN: [id number] Neck Size: 16.50  CLINICAL INFORMATION The patient is referred for a CPAP titration to treat sleep apnea.  Date of HST 05/24/21:  AHI 41.2, SpO2 low 85%.  SLEEP STUDY TECHNIQUE As per the AASM Manual for the Scoring of Sleep and Associated Events v2.3 (April 2016) with a hypopnea requiring 4% desaturations.  The channels recorded and monitored were frontal, central and occipital EEG, electrooculogram (EOG), submentalis EMG (chin), nasal and oral airflow, thoracic and abdominal wall motion, anterior tibialis EMG, snore microphone, electrocardiogram, and pulse oximetry. Continuous positive airway pressure (CPAP) was initiated at the beginning of the study and titrated to treat sleep-disordered breathing.  MEDICATIONS Medications self-administered by patient taken the night of the study : BASAGLAR, WARFARIN, ROSUVASTATIN  TECHNICIAN COMMENTS Comments added by technician: None Comments added by scorer: N/A  RESPIRATORY PARAMETERS Optimal PAP Pressure (cm): 12 AHI at Optimal Pressure (/hr): 2 Overall Minimal O2 (%): 89.0 Supine % at Optimal Pressure (%): 0 Minimal O2 at Optimal Pressure (%): 91.0   SLEEP ARCHITECTURE The study was initiated at 10:43:42 PM and ended at 4:53:39 AM.  Sleep onset time was 6.9 minutes and the sleep efficiency was 57.0%%. The total sleep time was 211 minutes.  The patient spent 17.5%% of the night in stage N1 sleep, 66.6%% in stage N2 sleep, 0.0%% in stage N3 and 15.9% in REM.Stage REM latency was 180.0 minutes  Wake after sleep onset was 152.1. Alpha intrusion was absent. Supine sleep was 9.00%.  CARDIAC DATA The 2 lead EKG demonstrated sinus rhythm. The mean heart rate  was 50.5 beats per minute. Other EKG findings include: PVCs.  LEG MOVEMENT DATA The total Periodic Limb Movements of Sleep (PLMS) were 0. The PLMS index was 0.0. A PLMS index of <15 is considered normal in adults.  IMPRESSIONS - He did well with CPAP pressure was 12 cm of water. - He did not require the use of supplemental oxygen during this study.  DIAGNOSIS - Obstructive Sleep Apnea (G47.33)  RECOMMENDATIONS - Trial of CPAP therapy on 12 cm H2O with a Large size Resmed Full Face AirFit F30i mask and heated humidification. - Avoid alcohol, sedatives and other CNS depressants that may worsen sleep apnea and disrupt normal sleep architecture. - Sleep hygiene should be reviewed to assess factors that may improve sleep quality. - Weight management and regular exercise should be initiated or continued.  [Electronically signed] 10/05/2021 02:19 PM  Chesley Mires MD, ABSM Diplomate, American Board of Sleep Medicine NPI: [id number]  [place] PH: [phone]   FX: [phone] [place]

## 2021-10-05 NOTE — Telephone Encounter (Signed)
Called and spoke with patients wife and she states the patient has decided that he will try one of the full face masks that he has at home currently since he has a few to choose from. Nothing further needed

## 2021-10-05 NOTE — Telephone Encounter (Signed)
Called and spoke to patients wife (ok per dpr). She voiced understanding of plan to change CPAP pressure. She states that during the study, the patients mask was changed to a full face mask instead of the nasal pillows he has been using. She states the tech told the patient that he was not doing well with the nasal pillows he normally uses and she wants to know if  Dr. Halford Chessman recommends that he continue to use the nasal pillows or if he should get a mask like the one he used during the sleep study.

## 2021-10-05 NOTE — Telephone Encounter (Signed)
CPAP 10/04/21 >> CPAP 12 cm H2O >> AHI 2, +R, +S   Please let him know he did well during his sleep study with CPAP at 12 cm H2O.  Please send an order to have [employer] change his CPAP to 12 cm H2O.  He needs a follow up in 6 months.

## 2021-10-05 NOTE — Telephone Encounter (Signed)
Can send order to get him set up with a large size Resmed Airfit F30i full face mask.  He can try this at home.  If he finds this isn't comfortable, then he can switch back to his previous mask.

## 2021-10-07 ENCOUNTER — Ambulatory Visit: Payer: Medicare HMO | Admitting: Family

## 2021-10-08 DIAGNOSIS — I447 Left bundle-branch block, unspecified: Secondary | ICD-10-CM | POA: Diagnosis not present

## 2021-10-08 DIAGNOSIS — I4891 Unspecified atrial fibrillation: Secondary | ICD-10-CM | POA: Diagnosis not present

## 2021-10-08 DIAGNOSIS — I214 Non-ST elevation (NSTEMI) myocardial infarction: Secondary | ICD-10-CM | POA: Diagnosis not present

## 2021-10-08 DIAGNOSIS — Z87891 Personal history of nicotine dependence: Secondary | ICD-10-CM | POA: Diagnosis not present

## 2021-10-09 DIAGNOSIS — N183 Chronic kidney disease, stage 3 unspecified: Secondary | ICD-10-CM | POA: Diagnosis not present

## 2021-10-09 DIAGNOSIS — R079 Chest pain, unspecified: Secondary | ICD-10-CM | POA: Diagnosis not present

## 2021-10-09 DIAGNOSIS — I129 Hypertensive chronic kidney disease with stage 1 through stage 4 chronic kidney disease, or unspecified chronic kidney disease: Secondary | ICD-10-CM | POA: Diagnosis not present

## 2021-10-09 DIAGNOSIS — I493 Ventricular premature depolarization: Secondary | ICD-10-CM | POA: Diagnosis not present

## 2021-10-09 DIAGNOSIS — I440 Atrioventricular block, first degree: Secondary | ICD-10-CM | POA: Diagnosis not present

## 2021-10-09 DIAGNOSIS — R791 Abnormal coagulation profile: Secondary | ICD-10-CM | POA: Diagnosis not present

## 2021-10-09 DIAGNOSIS — I25118 Atherosclerotic heart disease of native coronary artery with other forms of angina pectoris: Secondary | ICD-10-CM | POA: Diagnosis not present

## 2021-10-09 DIAGNOSIS — I25810 Atherosclerosis of coronary artery bypass graft(s) without angina pectoris: Secondary | ICD-10-CM | POA: Diagnosis not present

## 2021-10-09 DIAGNOSIS — R918 Other nonspecific abnormal finding of lung field: Secondary | ICD-10-CM | POA: Diagnosis not present

## 2021-10-09 DIAGNOSIS — R9431 Abnormal electrocardiogram [ECG] [EKG]: Secondary | ICD-10-CM | POA: Diagnosis not present

## 2021-10-09 DIAGNOSIS — I2510 Atherosclerotic heart disease of native coronary artery without angina pectoris: Secondary | ICD-10-CM | POA: Diagnosis not present

## 2021-10-09 DIAGNOSIS — E1165 Type 2 diabetes mellitus with hyperglycemia: Secondary | ICD-10-CM | POA: Diagnosis not present

## 2021-10-09 DIAGNOSIS — I447 Left bundle-branch block, unspecified: Secondary | ICD-10-CM | POA: Diagnosis not present

## 2021-10-09 DIAGNOSIS — R001 Bradycardia, unspecified: Secondary | ICD-10-CM | POA: Diagnosis not present

## 2021-10-09 DIAGNOSIS — I480 Paroxysmal atrial fibrillation: Secondary | ICD-10-CM

## 2021-10-09 DIAGNOSIS — I517 Cardiomegaly: Secondary | ICD-10-CM | POA: Diagnosis not present

## 2021-10-09 DIAGNOSIS — M109 Gout, unspecified: Secondary | ICD-10-CM | POA: Diagnosis not present

## 2021-10-09 DIAGNOSIS — I213 ST elevation (STEMI) myocardial infarction of unspecified site: Secondary | ICD-10-CM | POA: Diagnosis not present

## 2021-10-09 DIAGNOSIS — G4733 Obstructive sleep apnea (adult) (pediatric): Secondary | ICD-10-CM | POA: Diagnosis not present

## 2021-10-09 DIAGNOSIS — E1142 Type 2 diabetes mellitus with diabetic polyneuropathy: Secondary | ICD-10-CM | POA: Diagnosis not present

## 2021-10-09 DIAGNOSIS — E785 Hyperlipidemia, unspecified: Secondary | ICD-10-CM | POA: Diagnosis not present

## 2021-10-09 DIAGNOSIS — Z951 Presence of aortocoronary bypass graft: Secondary | ICD-10-CM | POA: Diagnosis not present

## 2021-10-09 DIAGNOSIS — I421 Obstructive hypertrophic cardiomyopathy: Secondary | ICD-10-CM | POA: Diagnosis not present

## 2021-10-09 DIAGNOSIS — T45515A Adverse effect of anticoagulants, initial encounter: Secondary | ICD-10-CM | POA: Diagnosis not present

## 2021-10-09 DIAGNOSIS — I771 Stricture of artery: Secondary | ICD-10-CM | POA: Diagnosis not present

## 2021-10-09 DIAGNOSIS — R7989 Other specified abnormal findings of blood chemistry: Secondary | ICD-10-CM | POA: Insufficient documentation

## 2021-10-09 DIAGNOSIS — Z87891 Personal history of nicotine dependence: Secondary | ICD-10-CM | POA: Diagnosis not present

## 2021-10-09 DIAGNOSIS — E1122 Type 2 diabetes mellitus with diabetic chronic kidney disease: Secondary | ICD-10-CM | POA: Diagnosis not present

## 2021-10-09 DIAGNOSIS — I214 Non-ST elevation (NSTEMI) myocardial infarction: Secondary | ICD-10-CM

## 2021-10-09 DIAGNOSIS — I491 Atrial premature depolarization: Secondary | ICD-10-CM | POA: Diagnosis not present

## 2021-10-09 DIAGNOSIS — I492 Junctional premature depolarization: Secondary | ICD-10-CM | POA: Diagnosis not present

## 2021-10-09 DIAGNOSIS — Z7984 Long term (current) use of oral hypoglycemic drugs: Secondary | ICD-10-CM | POA: Diagnosis not present

## 2021-10-09 DIAGNOSIS — R778 Other specified abnormalities of plasma proteins: Secondary | ICD-10-CM | POA: Insufficient documentation

## 2021-10-09 DIAGNOSIS — Z952 Presence of prosthetic heart valve: Secondary | ICD-10-CM | POA: Diagnosis not present

## 2021-10-09 DIAGNOSIS — I10 Essential (primary) hypertension: Secondary | ICD-10-CM | POA: Diagnosis not present

## 2021-10-09 DIAGNOSIS — R4701 Aphasia: Secondary | ICD-10-CM | POA: Diagnosis not present

## 2021-10-09 HISTORY — DX: Paroxysmal atrial fibrillation: I48.0

## 2021-10-09 HISTORY — DX: Non-ST elevation (NSTEMI) myocardial infarction: I21.4

## 2021-10-11 HISTORY — PX: CORONARY ANGIOGRAPHY: CATH118303

## 2021-10-12 DIAGNOSIS — I421 Obstructive hypertrophic cardiomyopathy: Secondary | ICD-10-CM | POA: Diagnosis not present

## 2021-10-12 DIAGNOSIS — I480 Paroxysmal atrial fibrillation: Secondary | ICD-10-CM | POA: Diagnosis not present

## 2021-10-12 DIAGNOSIS — I10 Essential (primary) hypertension: Secondary | ICD-10-CM | POA: Diagnosis not present

## 2021-10-12 DIAGNOSIS — E1165 Type 2 diabetes mellitus with hyperglycemia: Secondary | ICD-10-CM | POA: Diagnosis not present

## 2021-10-12 DIAGNOSIS — I214 Non-ST elevation (NSTEMI) myocardial infarction: Secondary | ICD-10-CM | POA: Diagnosis not present

## 2021-10-12 DIAGNOSIS — I2510 Atherosclerotic heart disease of native coronary artery without angina pectoris: Secondary | ICD-10-CM | POA: Diagnosis not present

## 2021-10-12 DIAGNOSIS — Z952 Presence of prosthetic heart valve: Secondary | ICD-10-CM | POA: Diagnosis not present

## 2021-10-14 ENCOUNTER — Ambulatory Visit: Payer: Medicare HMO | Attending: Internal Medicine | Admitting: *Deleted

## 2021-10-14 DIAGNOSIS — Z7901 Long term (current) use of anticoagulants: Secondary | ICD-10-CM

## 2021-10-14 DIAGNOSIS — Z952 Presence of prosthetic heart valve: Secondary | ICD-10-CM

## 2021-10-14 LAB — POCT INR: INR: 1.3 — AB (ref 2.0–3.0)

## 2021-10-14 NOTE — Patient Instructions (Signed)
Description   Continue Lovenox injections twice a day. Today and tomorrow take 1.5 tablets then continue taking warfarin 1 tablet daily except for 1/2 tablet on Mondays, Wednesdays, and Fridays. Continue consistency with leafy green intake. Recheck INR on Wednesday.   Coumadin Clinic [phone] or [phone].

## 2021-10-16 ENCOUNTER — Encounter: Payer: Self-pay | Admitting: Pharmacist Clinician (PhC)/ Clinical Pharmacy Specialist

## 2021-10-17 ENCOUNTER — Encounter: Payer: Self-pay | Admitting: Cardiology

## 2021-10-17 MED ORDER — ENOXAPARIN SODIUM 100 MG/ML IJ SOSY: 100.0000 mg | PREFILLED_SYRINGE | Freq: Two times a day (BID) | INTRAMUSCULAR | 0 refills | Status: DC

## 2021-10-17 NOTE — Telephone Encounter (Signed)
error 

## 2021-10-18 NOTE — Progress Notes (Unsigned)
Cardiology Clinic Note   Patient Name: [name] Date of Encounter: 10/20/2021  Primary Care Provider:  [name], NP Primary Cardiologist:  Kirk Ruths, MD  Patient Profile    [name] 76 year old male presents the clinic today for follow-up evaluation of his coronary artery disease and essential hypertension.  Past Medical History    Past Medical History:  Diagnosis Date   Actinic keratosis 02/14/2011   Allergy    Aortic stenosis    Arthritis    Bilateral sensorineural hearing loss 06/16/2009   CAD (coronary artery disease)    cabg   Complication of anesthesia    Coronary atherosclerosis of native coronary artery 06/15/2009   Diabetic polyneuropathy 11/11/2018   Dyslipidemia 18/56/3149   Embolic stroke involving left middle cerebral artery  05/30/2021   Erectile dysfunction 05/18/2009   Essential hypertension 06/16/2009   Expressive aphasia 06/25/2021   External hemorrhoids 03/21/2010   Gout 04/09/2015   Heart murmur    Hemochromatosis 02/14/2011   History of aortic valve replacement 10/13/2009   History of CABG 11/05/2018   2005- 1. Coronary artery bypass grafting x4 with LIMA-LAD, SVG-OM1, SVG-AM and dCFX.  2. Aortic valve replacement with St Jude AVR 3. Septal myomectomy.  4. Myoview low risk 2016   History of hepatitis 01/31/1972   Hypertriglyceridemia 06/13/2010   Hypertrophic obstructive cardiomyopathy 05/06/2008   SP septal myomectomy at surgery 2005 Echo Nov 2018- Hyperdynamic LVEF with severe basal septal hypertrophy. There is chordal SAM with resting gradient of 16 mmHg that increases to 85 mmHg with Valsalva        Leg weakness, bilateral 01/18/2021   Long term (current) use of anticoagulants 05/22/2018   Lumbar stenosis with neurogenic claudication 02/04/2021   Malignant neoplasm of prostate 06/17/2009   Mechanical heart valve present    Manufacturer: Sherlean Foot #: [id number]  Model #: [id number]. Card states MRI  compatible with 3 teslas or less.   Obesity, unspecified 04/29/2008   OSA (obstructive sleep apnea) 07/12/2012   moderate-severe; uses CPAP nightly   PONV (postoperative nausea and vomiting)    after CABG- slow to wake up   Presence of prosthetic heart valve    Primary osteoarthritis of left shoulder 10/31/2018   S/P lumbar laminectomy 02/04/2021   Stage 3 chronic kidney disease due to type 2 diabetes mellitus 11/11/2018   Tobacco use 06/16/2009   Type 2 diabetes mellitus with hyperglycemia    Ventral hernia 03/03/2014   Past Surgical History:  Procedure Laterality Date   AORTIC VALVE REPLACEMENT  11/14/2003   [facility]   APPENDECTOMY  [place]   CORONARY ANGIOGRAPHY  10/11/2021   CORONARY ARTERY BYPASS GRAFT  10/2003   [place]   right, inguinal   HERNIA REPAIR  2002   left, inguinal   HIP SURGERY  2006   right hip   LUMBAR LAMINECTOMY/DECOMPRESSION MICRODISCECTOMY Bilateral 02/04/2021   Procedure: Bilateral Lumbar Two-Three Laminectomy;  Surgeon: Kristeen Miss, MD;  Location: [facility];  Service: Neurosurgery;  Laterality: Bilateral;  3C/RM 32   PILONIDAL CYST EXCISION  1964   prostate seed implant  03/2010   TONSILLECTOMY  childhood    Allergies  No Known Allergies  History of Present Illness    [name] has a PMH of HTN, CAD, hypertrophic obstructive cardiomyopathy, embolic CVA 1/28, OSA, diabetic neuropathy, CKD stage III, type 2 diabetes, bilateral sensorineural hearing loss, HLD, obesity, tobacco use, ED,  ventral hernia, bilateral lower extremity weakness, lumbar laminectomy, expressive aphasia, and aortic valve replacement 2005.  He is status post CABG (LIMA-LAD, SVG-marginal, sequential SVG-RCA-distal left circumflex 2005 with septal myomectomy.  His abdominal CT 4/15 showed no aneurysm.  Nuclear stress test 5/16 showed normal perfusion and normal LV function.  Echocardiogram 9/22 showed normal LV function, previous septal  myomectomy with no significant gradient, G2 DD, previous AVR with mean gradient of 10 mmHg and dilated ascending aorta measuring 43 mm.  He followed up with Dr. Stanford Breed 2/23.  At that time he was doing well.  He had been admitted to the hospital 06/24/2021 with expressive aphasia.  His INR on arrival was 2.7.  CTA showed no intracranial large vessel occlusion, moderate-severe narrowing of the right cavernous carotid, moderate narrowing of the right supraclinoid carotid.  CTA of the neck showed no significant stenosis.  MRI showed left MCA patchy small infarcts.  His echocardiogram during admission showed an EF greater than 75%, normal functioning aortic valve prosthesis.  Stroke team felt his CVA was embolic despite his therapeutic INR.  Aspirin was added to his medication regimen.  His INR target was changed to 2.5-3.5.  At discharge his INR was 1.9.  He was discharged with overlapping Lovenox bridging.  He was seen in follow-up by Almyra Deforest PA-C on 07/12/2021.  During that time he was noted to noted to have right or left-sided weakness.  He continued with pressured speech.  He was working with speech therapy.  He denied recent chest pain, worsening shortness of breath.  Follow-up was planned for 3 months.  He presents to the clinic today for follow-up evaluation states he was admitted to the hospital in the [place] with NSTEMI on 10/11/2021.  His aspirin was discontinued he was started on Plavix and Imdur.  His cardiac troponins peaked around 10,000.  Cardiac catheterization showed chronic total occlusion of mid LAD, chronic total occlusion of mid RCA, obstructive narrowing of the first diagonal artery, mid OM1 and ostial mid OM 2.  His LIMA-LAD bypass graft was 80% stenosed at the very distal LAD.  His left circumflex/RCA vein graft-OM was occluded.  Case was discussed with Dr. Stanford Breed.  His Coumadin was resumed.  He was taken off of aspirin and placed on Plavix.  He was also started on Imdur.  He has  had no further chest discomfort since being discharged from the hospital.  He is tolerating his Imdur well.  He is beginning to resume his physical activity.  His left radial cath site is healing well.  He does have some left wrist ecchymosis and right lower abdomen ecchymosis from Lovenox injection.  I will continue his current medication regimen, keep follow-up with Dr. Stanford Breed in 1 month, have him increase his physical activity as tolerated.  Today the denies chest pain, shortness of breath, lower extremity edema, fatigue, palpitations, melena, hematuria, hemoptysis, diaphoresis, weakness, presyncope, syncope, orthopnea, and PND.    Home Medications    Prior to Admission medications   Medication Sig Start Date End Date Taking? Authorizing Provider  AMBULATORY NON FORMULARY MEDICATION cpap cushions            AirFit F20 (Size: Large)           AirFit F30 (Size: Med)   Please FAX the prescription to:  [phone] 02/10/20   [name], NP  aspirin EC 81 MG tablet Take 1 tablet (81 mg total) by mouth daily. Swallow whole. 07/15/21   Almyra Deforest, PA  betamethasone  dipropionate (DIPROLENE) 0.05 % cream Apply topically 2 (two) times daily as needed. To eczema rash 09/06/18   [name], NP  Blood Glucose Monitoring Suppl Supplies MISC Use for monitoring glucose level 08/05/18   [name], NP  colchicine 0.6 MG tablet TAKE 2 TABS BY MOUTH NOW AND THEN 1 TAB IN 1 HOUR FOR GOUT. (MAX 3 TABS/24 HRS) 12/07/20   [name], NP  Continuous Blood Gluc Sensor (DEXCOM G6 SENSOR) MISC 1 Device by Does not apply route as directed. 05/18/20   Shamleffer, Melanie Crazier, MD  Continuous Blood Gluc Transmit (DEXCOM G6 TRANSMITTER) MISC 1 Device by Does not apply route as directed. 05/18/20   Shamleffer, Melanie Crazier, MD  dapagliflozin propanediol (FARXIGA) 10 MG TABS tablet Take 1 tablet (10 mg total) by mouth daily before breakfast. 04/26/21   Shamleffer, Melanie Crazier, MD   enoxaparin (LOVENOX) 100 MG/ML injection Inject 1 mL (100 mg total) into the skin every 12 (twelve) hours. 10/17/21   Lelon Perla, MD  fenofibrate (TRICOR) 145 MG tablet TAKE 1 TABLET BY MOUTH EVERY DAY 07/11/21   [name], NP  furosemide (LASIX) 20 MG tablet TAKE 1 TABLET BY MOUTH EVERY DAY AS NEEDED FOR SWELLING 07/11/21   [name], NP  glucose blood (ONETOUCH VERIO) test strip Use as instructed 09/15/20   [name], NP  insulin aspart (NOVOLOG FLEXPEN) 100 UNIT/ML FlexPen Max daily 70 units Patient taking differently: Using sliding scale, pts spouse unsure of dosage 04/26/21   Shamleffer, Melanie Crazier, MD  Insulin Glargine (BASAGLAR KWIKPEN) 100 UNIT/ML INJECT 38 UNITS INTO THE SKIN DAILY. 09/26/21   Shamleffer, Melanie Crazier, MD  Insulin Pen Needle 32G X 4 MM MISC 1 Device by Does not apply route in the morning, at noon, in the evening, and at bedtime. 04/26/21   Shamleffer, Melanie Crazier, MD  metoprolol succinate (TOPROL-XL) 25 MG 24 hr tablet TAKE 1 TABLET BY MOUTH EVERY DAY 06/29/21   [name], NP  OneTouch Delica Lancets 03J MISC USE AS DIRECTED 09/15/20   [name], NP  rosuvastatin (CRESTOR) 40 MG tablet Take 1 tablet (40 mg total) by mouth daily. Patient taking differently: Take 40 mg by mouth every evening. 12/06/20 07/26/21  Lelon Perla, MD  tacrolimus (PROTOPIC) 0.1 % ointment Apply 1 application  topically daily as needed (irritation). 11/29/20   [provider]  warfarin (COUMADIN) 7.5 MG tablet TAKE 1/2 A TABLET TO 1 TABLET BY MOUTH DAILY AS DIRECTED BY THE COUMADIN CLINIC 09/26/21   Lelon Perla, MD    Family History    Family History  Problem Relation Age of Onset   Heart disease Mother    Stroke Mother    Heart disease Father    Asperger's syndrome Son    Hyperlipidemia Son    Coronary artery disease Brother    Cancer Neg Hx        negative for colon cancer   He indicated that his mother is  deceased. He indicated that his father is deceased. He indicated that the status of his brother is unknown. He indicated that his daughter is alive. He indicated that his son is alive. He indicated that the status of his neg hx is unknown.  Social History    Social History   Socioeconomic History   Marital status: Married    Spouse name: Not on file   Number of children: Not on file   Years of education: 18   Highest education level: Master's degree (  e.g., MA, MS, MEng, MEd, MSW, MBA)  Occupational History   Occupation: Retired  Tobacco Use   Smoking status: Former    Types: Cigars    Quit date: 06/2021    Years since quitting: 0.3   Smokeless tobacco: Never   Tobacco comments:    Pt states he smokes 1 cigar about once a month. 06/01/2021    Pt quit  Cigars 06/2021  Substance and Sexual Activity   Alcohol use: Not Currently    Comment: wine maybe 1 per month   Drug use: No   Sexual activity: Never  Other Topics Concern   Not on file  Social History Narrative   Right Handed    Lives in a one sotyr home. One step leads to front door.    Social Determinants of Health   Financial Resource Strain: Not on file  Food Insecurity: Not on file  Transportation Needs: Not on file  Physical Activity: Not on file  Stress: Not on file  Social Connections: Not on file  Intimate Partner Violence: Not on file     Review of Systems    General:  No chills, fever, night sweats or weight changes.  Cardiovascular:  No chest pain, dyspnea on exertion, edema, orthopnea, palpitations, paroxysmal nocturnal dyspnea. Dermatological: No rash, lesions/masses Respiratory: No cough, dyspnea Urologic: No hematuria, dysuria Abdominal:   No nausea, vomiting, diarrhea, bright red blood per rectum, melena, or hematemesis Neurologic:  No visual changes, wkns, changes in mental status. All other systems reviewed and are otherwise negative except as noted above.  Physical Exam    VS:  BP 124/60    Pulse 73   Ht '5\' 10"'$  (1.778 m)   Wt 218 lb (98.9 kg)   SpO2 97%   BMI 31.28 kg/m  , BMI Body mass index is 31.28 kg/m. GEN: Well nourished, well developed, in no acute distress. HEENT: normal. Neck: Supple, no JVD, carotid bruits, or masses. Cardiac: RRR, no murmurs, rubs, or gallops. No clubbing, cyanosis, edema.  Radials/DP/PT 2+ and equal bilaterally.  Respiratory:  Respirations regular and unlabored, clear to auscultation bilaterally. GI: Soft, nontender, nondistended, BS + x 4. MS: no deformity or atrophy. Skin: warm and dry, no rash. Neuro:  Strength and sensation are intact. Psych: Normal affect.  Accessory Clinical Findings    Recent Labs: 12/28/2020: TSH 4.39 06/24/2021: ALT 24 06/26/2021: Hemoglobin 15.0; Platelets 227 10/04/2021: BUN 32; Creatinine, Ser 1.38; Potassium 4.2; Sodium 139   Recent Lipid Panel    Component Value Date/Time   CHOL 143 06/25/2021 0358   CHOL 162 03/24/2021 0815   TRIG 240 (H) 06/25/2021 0358   HDL 30 (L) 06/25/2021 0358   HDL 29 (L) 03/24/2021 0815   CHOLHDL 4.8 06/25/2021 0358   VLDL 48 (H) 06/25/2021 0358   LDLCALC 65 06/25/2021 0358   LDLCALC 77 03/24/2021 0815   LDLCALC 99 09/22/2019 1119   LDLDIRECT 86.0 09/09/2020 0916         ECG personally reviewed by me today-none today.  Echocardiogram 06/25/2021   1. No apical thrombus with Definity contrast. Left ventricular ejection  fraction, by estimation, is >75%. The left ventricle has hyperdynamic  function. The left ventricle has no regional wall motion abnormalities.  There is moderate left ventricular  hypertrophy of the basal-septal segment. Left ventricular diastolic  parameters are consistent with Grade I diastolic dysfunction (impaired  relaxation). Elevated left ventricular end-diastolic pressure. The E/e' is  14.   2. Right ventricular systolic function is normal.  The right ventricular  size is normal.   3. The mitral valve is grossly normal. Trivial mitral valve   regurgitation.   4. The aortic valve was not well visualized. Aortic valve regurgitation  is trivial. There is a 26 mm St. Jude mechanical valve present in the  aortic position. Procedure Date: 2005. Echo findings are consistent with  normal structure and function of the  aortic valve prosthesis. Aortic valve mean gradient measures 11.0 mmHg.   5. Aortic dilatation noted. There is mild dilatation of the ascending  aorta, measuring 42 mm.   Comparison(s): Changes from prior study are noted. 10/11/2020: LVEF 60-65%,  St. Jude aortic valve with 9.8 mmHg gradient, dilated aorta to 43 mm.   Assessment & Plan   1.  Coronary artery disease-no chest pain today.  Status post CABG. underwent cardiac catheterization on 10/11/2021 in [place].  Was noted to have LIMA-LAD 80% narrowing at the distal LAD.  Medical management was decided on.  His last 1 was discontinued, started Plavix, and Imdur was started. Continue Plavix, metoprolol, rosuvastatin, fenofibrate, Imdur Heart healthy low-sodium diet Increase physical activity as tolerated  Mechanical AVR-post CVA his INR goal is moved to 2.5-3.5.  Aspirin was added to his medication regimen.  INR 10/14/2021 was 1.3.  Clinical pharmacist is providing Lovenox bridging.  Repeat INR 10/19/2021 therapeutic.  Blood Continue Plavix, Coumadin Follows with INR clinic  History of CVA-no musculoskeletal deficits.  Speech continues to be somewhat pressured.  Continues to work with speech therapy. Continue Coumadin, aspirin Continue speech therapy  Essential hypertension-BP today 124/60 Continue metoprolol Heart healthy low-sodium diet-salty 6 given Increase physical activity as tolerated  Hyperlipidemia-LDL 65 06/25/2021 Continue aspirin, rosuvastatin Heart healthy low-sodium high-fiber diet Increase physical activity as tolerated  Disposition: Follow-up with Dr. Stanford Breed as scheduled.  Jossie Ng. Roe Koffman NP-C     10/20/2021, 8:27 AM [address] Office [phone] Fax [phone]  Notice: This dictation was prepared with Dragon dictation along with smaller phrase technology. Any transcriptional errors that result from this process are unintentional and may not be corrected upon review.  I spent 15 minutes examining this patient, reviewing medications, and using patient centered shared decision making involving her cardiac care.  Prior to her visit I spent greater than 20 minutes reviewing her past medical history,  medications, and prior cardiac tests.

## 2021-10-19 ENCOUNTER — Ambulatory Visit: Payer: Medicare HMO | Attending: Cardiovascular Disease | Admitting: *Deleted

## 2021-10-19 ENCOUNTER — Ambulatory Visit (INDEPENDENT_AMBULATORY_CARE_PROVIDER_SITE_OTHER): Payer: Medicare HMO | Admitting: Internal Medicine

## 2021-10-19 ENCOUNTER — Telehealth: Payer: Self-pay | Admitting: Cardiology

## 2021-10-19 VITALS — BP 144/68 | HR 64 | Temp 98.3°F | Resp 18 | Ht 69.0 in | Wt 218.5 lb

## 2021-10-19 DIAGNOSIS — M25432 Effusion, left wrist: Secondary | ICD-10-CM | POA: Diagnosis not present

## 2021-10-19 DIAGNOSIS — Z952 Presence of prosthetic heart valve: Secondary | ICD-10-CM

## 2021-10-19 DIAGNOSIS — M25532 Pain in left wrist: Secondary | ICD-10-CM

## 2021-10-19 DIAGNOSIS — Z7901 Long term (current) use of anticoagulants: Secondary | ICD-10-CM

## 2021-10-19 DIAGNOSIS — E119 Type 2 diabetes mellitus without complications: Secondary | ICD-10-CM | POA: Diagnosis not present

## 2021-10-19 LAB — POCT INR: INR: 2.7 (ref 2.0–3.0)

## 2021-10-19 MED ORDER — PREDNISONE 10 MG PO TABS: ORAL_TABLET | ORAL | 0 refills | Status: DC

## 2021-10-19 MED ORDER — CEPHALEXIN 500 MG PO CAPS: 500.0000 mg | ORAL_CAPSULE | Freq: Four times a day (QID) | ORAL | 0 refills | Status: DC

## 2021-10-19 MED ORDER — COLCHICINE 0.6 MG PO TABS: 0.6000 mg | ORAL_TABLET | Freq: Two times a day (BID) | ORAL | 1 refills | Status: DC | PRN

## 2021-10-19 NOTE — Patient Instructions (Signed)
Take colchicine twice daily until the wrist is better  Take prednisone 20 mg once daily x5 days.  If you have blood sugars consistently more than 200, stop prednisone.  I am also prescribing an antibiotic called cephalexin.  Please discuss all of the above with your heart doctor tomorrow.  If you have severe pain, hand swelling, hand numbness tonight: Go to the ER

## 2021-10-19 NOTE — Telephone Encounter (Signed)
Dr. Larose Kells calling to speak with DOD (Dr. Gwenlyn Found) regarding left radial cath site. Pt is complaining of pain on the ulnar side of the left arm with no evidence of hematoma, oozing or bruit over cath site. Per Dr. Gwenlyn Found most likely unrelated. Dr. Gwenlyn Found discussed this with Dr. Larose Kells personally.

## 2021-10-19 NOTE — Patient Instructions (Signed)
Description   Continue taking warfarin 1 tablet daily except for 1/2 tablet on Mondays, Wednesdays, and Fridays. Continue consistency with leafy green intake. Resume normal leafy intake. Recheck INR in 2 weeks. Coumadin Clinic [phone] or [phone].

## 2021-10-19 NOTE — Progress Notes (Unsigned)
Subjective:    Patient ID: [name], male    DOB: [birth year], 76 y.o.   MRN: [id number]  DOS:  10/19/2021 Type of visit - description: acute   Admitted to hospital in [place] and  discharge  10/09/2021, Dx: Non-STEMI. Had a cardiac catheterization 10-11-21 The case was discussed with primary cardiologist in [place] and they agree on medical management until he is seen here. New medicines: Amlodipine, Plavix, enoxaparin, gabapentin, isosorbide. Aspirin stopped  He is here today because earlier this morning he started to develop L  wrist pain, gradually, worse with certain movements like turning his hand. The pain is located at the ulnar side of the wrist. No injury. Denies any finger weakness. Questionable numbness distally from the wrist. No fever chills History of gout, previous episodes have been typically in the lower extremity.   Review of Systems See above   Past Medical History:  Diagnosis Date   Actinic keratosis 02/14/2011   Allergy    Aortic stenosis    Arthritis    Bilateral sensorineural hearing loss 06/16/2009   CAD (coronary artery disease)    cabg   Complication of anesthesia    Coronary atherosclerosis of native coronary artery 06/15/2009   Diabetic polyneuropathy 11/11/2018   Dyslipidemia 69/79/4801   Embolic stroke involving left middle cerebral artery  05/30/2021   Erectile dysfunction 05/18/2009   Essential hypertension 06/16/2009   Expressive aphasia 06/25/2021   External hemorrhoids 03/21/2010   Gout 04/09/2015   Heart murmur    Hemochromatosis 02/14/2011   History of aortic valve replacement 10/13/2009   History of CABG 11/05/2018   2005- 1. Coronary artery bypass grafting x4 with LIMA-LAD, SVG-OM1, SVG-AM and dCFX.  2. Aortic valve replacement with St Jude AVR 3. Septal myomectomy.  4. Myoview low risk 2016   History of hepatitis 01/31/1972   Hypertriglyceridemia 06/13/2010   Hypertrophic obstructive cardiomyopathy  05/06/2008   SP septal myomectomy at surgery 2005 Echo Nov 2018- Hyperdynamic LVEF with severe basal septal hypertrophy. There is chordal SAM with resting gradient of 16 mmHg that increases to 85 mmHg with Valsalva        Leg weakness, bilateral 01/18/2021   Long term (current) use of anticoagulants 05/22/2018   Lumbar stenosis with neurogenic claudication 02/04/2021   Malignant neoplasm of prostate 06/17/2009   Mechanical heart valve present    Manufacturer: Sherlean Foot #: [id number]  Model #: [id number]. Card states MRI compatible with 3 teslas or less.   Obesity, unspecified 04/29/2008   OSA (obstructive sleep apnea) 07/12/2012   moderate-severe; uses CPAP nightly   PONV (postoperative nausea and vomiting)    after CABG- slow to wake up   Presence of prosthetic heart valve    Primary osteoarthritis of left shoulder 10/31/2018   S/P lumbar laminectomy 02/04/2021   Stage 3 chronic kidney disease due to type 2 diabetes mellitus 11/11/2018   Tobacco use 06/16/2009   Type 2 diabetes mellitus with hyperglycemia    Ventral hernia 03/03/2014    Past Surgical History:  Procedure Laterality Date   AORTIC VALVE REPLACEMENT  11/14/2003   St Jude [place]   CORONARY ANGIOGRAPHY  10/11/2021   CORONARY ARTERY BYPASS GRAFT  10/2003   Home   right, inguinal   HERNIA REPAIR  2002   left, inguinal   HIP SURGERY  2006   right hip   LUMBAR LAMINECTOMY/DECOMPRESSION MICRODISCECTOMY Bilateral 02/04/2021  Procedure: Bilateral Lumbar Two-Three Laminectomy;  Surgeon: Kristeen Miss, MD;  Location: [facility];  Service: Neurosurgery;  Laterality: Bilateral;  3C/RM 20   PILONIDAL CYST EXCISION  1964   prostate seed implant  03/2010   TONSILLECTOMY  childhood    Current Outpatient Medications  Medication Instructions   AMBULATORY NON FORMULARY MEDICATION cpap cushions<BR><BR>          AirFit F20 (Size: Large)<BR>          AirFit F30 (Size:  Med)<BR> <BR>Please FAX the prescription to:  [phone]   amLODipine (NORVASC) 5 MG tablet 1 tablet, Oral, Daily   aspirin EC 81 mg, Oral, Daily, Swallow whole.   betamethasone dipropionate (DIPROLENE) 0.05 % cream Topical, 2 times daily PRN, To eczema rash   Blood Glucose Monitoring Suppl Supplies MISC Use for monitoring glucose level   clopidogrel (PLAVIX) 75 mg, Oral, Daily   colchicine 0.6 MG tablet TAKE 2 TABS BY MOUTH NOW AND THEN 1 TAB IN 1 HOUR FOR GOUT. (MAX 3 TABS/24 HRS)   Continuous Blood Gluc Sensor (DEXCOM G6 SENSOR) MISC 1 Device, Does not apply, As directed   Continuous Blood Gluc Transmit (DEXCOM G6 TRANSMITTER) MISC 1 Device, Does not apply, As directed   dapagliflozin propanediol (FARXIGA) 10 mg, Oral, Daily before breakfast   enoxaparin (LOVENOX) 100 mg, Subcutaneous, Every 12 hours   fenofibrate (TRICOR) 145 MG tablet TAKE 1 TABLET BY MOUTH EVERY DAY   furosemide (LASIX) 20 MG tablet TAKE 1 TABLET BY MOUTH EVERY DAY AS NEEDED FOR SWELLING   gabapentin (NEURONTIN) 100 mg, Oral, Every 12 hours   glucose blood (ONETOUCH VERIO) test strip Use as instructed   insulin aspart (NOVOLOG FLEXPEN) 100 UNIT/ML FlexPen Max daily 70 units   Insulin Glargine (BASAGLAR KWIKPEN) 100 UNIT/ML INJECT 38 UNITS INTO THE SKIN DAILY.   Insulin Pen Needle 32G X 4 MM MISC 1 Device, Does not apply, 4 times daily   isosorbide mononitrate (IMDUR) 30 MG 24 hr tablet 1 tablet, Oral, Daily   metoprolol succinate (TOPROL-XL) 25 MG 24 hr tablet TAKE 1 TABLET BY MOUTH EVERY DAY   OneTouch Delica Lancets 84Z MISC USE AS DIRECTED   rosuvastatin (CRESTOR) 40 mg, Oral, Daily   tacrolimus (PROTOPIC) 0.1 % ointment 1 application , Topical, Daily PRN   warfarin (COUMADIN) 7.5 MG tablet TAKE 1/2 A TABLET TO 1 TABLET BY MOUTH DAILY AS DIRECTED BY THE COUMADIN CLINIC       Objective:   Physical Exam BP (!) 144/68   Pulse 64   Temp 98.3 F (36.8 C) (Oral)   Resp 18   Ht '5\' 9"'$  (1.753 m)   Wt 218 lb 8  oz (99.1 kg)   SpO2 95%   BMI 32.27 kg/m   General:   Well developed, NAD, BMI noted. HEENT:  Normocephalic . Face symmetric, atraumatic Upper extremities: Right arm: Normal Left arm: At the palmar aspect of the forearm there is a bruise, see picture, it is slightly warm but not TTP. He has normal radial pulse proximally and distally from the puncture wound from the recent cath.  No mass that I can tell. The wrist itself is very painful w/ passive or active motion. Puffiness noted at the ulnar side of the joint. Palm and fingers with minimal swelling, no discoloration. Motor exam of the fingers is normal. Skin: Not pale. Not jaundice Neurologic:  alert & oriented X3.  Speech normal, gait appropriate for age and unassisted Psych--  Cognition and judgment appear intact.  Cooperative with normal attention span and concentration.  Behavior appropriate. No anxious or depressed appearing.           Assessment    76 year old gentleman, PMH includes DM, OSA, CAD history of CABG, gout, DJD, chronically anticoagulated, CKD, prostate cancer presents with the following issues:  Left wrist pain and swelling: In the context of a recent STEMI, cardiac catheterization via left radial artery, history of gout. On clinical grounds doubt this is complication from the radial catheterization, I am more suspicious of gout or perhaps even a early infection as the forearm(is  slightly warm) . Situation d/w  DOD of cardiology who agreed  Plan: Colchicine twice daily until better, low-dose of prednisone, watch for increased CBGs (he has CGM); if blood sugars more than 200-- stop prednisone. Empiric Keflex. See cardiology tomorrow ER if he gets worse, see AVS See PCP for hospital f/u   Time spent  35 reviewing the chart and coordinating his care

## 2021-10-20 ENCOUNTER — Ambulatory Visit: Payer: Medicare HMO | Attending: General Practice | Admitting: General Practice

## 2021-10-20 ENCOUNTER — Encounter: Payer: Self-pay | Admitting: General Practice

## 2021-10-20 VITALS — BP 124/60 | HR 73 | Ht 70.0 in | Wt 218.0 lb

## 2021-10-20 DIAGNOSIS — Z8673 Personal history of transient ischemic attack (TIA), and cerebral infarction without residual deficits: Secondary | ICD-10-CM

## 2021-10-20 DIAGNOSIS — I10 Essential (primary) hypertension: Secondary | ICD-10-CM

## 2021-10-20 DIAGNOSIS — I25810 Atherosclerosis of coronary artery bypass graft(s) without angina pectoris: Secondary | ICD-10-CM

## 2021-10-20 DIAGNOSIS — Z952 Presence of prosthetic heart valve: Secondary | ICD-10-CM | POA: Diagnosis not present

## 2021-10-20 DIAGNOSIS — E785 Hyperlipidemia, unspecified: Secondary | ICD-10-CM

## 2021-10-20 NOTE — Patient Instructions (Addendum)
Medication Instructions:  The current medical regimen is effective;  continue present plan and medications as directed. Please refer to the Current Medication list given to you today.  *If you need a refill on your cardiac medications before your next appointment, please call your pharmacy*   Lab Work: NONE If you have labs (blood work) drawn today and your tests are completely normal, you will receive your results only by:  [place] (if you have MyChart) OR A paper copy in the mail If you have any lab test that is abnormal or we need to change your treatment, we will call you to review the results.   Testing/Procedures: NONE   Follow-Up: At [facility], you and your health needs are our priority.  As part of our continuing mission to provide you with exceptional heart care, we have created designated Provider Care Teams.  These Care Teams include your primary Cardiologist (physician) and Advanced Practice Providers (APPs -  Physician Assistants and Nurse Practitioners) who all work together to provide you with the care you need, when you need it.  Your next appointment:   KEEP SCHEDULED APPT   The format for your next appointment:   In Person  Provider:   Kirk Ruths, MD     Other Instructions WE WILL CALL YOU TO DISCUSS FURTHER  START TO INCREASE PHYSICAL ACTIVITY-AS TOLERATED  Important Information About Sugar

## 2021-10-21 DIAGNOSIS — G4733 Obstructive sleep apnea (adult) (pediatric): Secondary | ICD-10-CM | POA: Diagnosis not present

## 2021-10-21 DIAGNOSIS — R0681 Apnea, not elsewhere classified: Secondary | ICD-10-CM | POA: Diagnosis not present

## 2021-10-21 DIAGNOSIS — R0683 Snoring: Secondary | ICD-10-CM | POA: Diagnosis not present

## 2021-10-21 DIAGNOSIS — I10 Essential (primary) hypertension: Secondary | ICD-10-CM | POA: Diagnosis not present

## 2021-10-22 ENCOUNTER — Encounter: Payer: Self-pay | Admitting: Pharmacist Clinician (PhC)/ Clinical Pharmacy Specialist

## 2021-10-24 MED ORDER — CLOPIDOGREL BISULFATE 75 MG PO TABS: 75.0000 mg | ORAL_TABLET | Freq: Every day | ORAL | 1 refills | Status: DC

## 2021-10-24 MED ORDER — ISOSORBIDE MONONITRATE ER 30 MG PO TB24: 30.0000 mg | ORAL_TABLET | Freq: Every day | ORAL | 1 refills | Status: DC

## 2021-10-24 MED ORDER — AMLODIPINE BESYLATE 5 MG PO TABS: 5.0000 mg | ORAL_TABLET | Freq: Every day | ORAL | 1 refills | Status: DC

## 2021-10-25 ENCOUNTER — Telehealth: Payer: Self-pay | Admitting: *Deleted

## 2021-10-25 NOTE — Telephone Encounter (Signed)
Received a message from 9/23/203 at 959pm from the pt that was forwarded to me today that states:   [name],   Please tell Candace my wrist pain was diagnosed as GOUT at my 3:20 Doctor appointment.   Treatment was:           Prednisone '10mg'$  (2x) for 5 days           Colchicine 0.'6mg'$  (2x) as needed          Cephalexin '500mg'$  (4x)   len  Called the pt and advised that the prednisone and colchicine can cause the INR to increase. The prednisone dose is smaller but in somes cases it can affect the INR. Pt states he finished using the colchicine and will finish the prednisone today along with the cephalexin. He was advised that we could check his INR today to ensure INR does not increase too much. He stated since he was done was there really a need? He was advised on risks-bleeding & bruising and he prefers to keep next week appt since he is done with the meds. Also, advised pt to eat some leafy veggies today or tomorrow and he stated he would do so. Confirmed next week appt and pt will notify us if anything changes.

## 2021-10-28 ENCOUNTER — Ambulatory Visit: Payer: Medicare HMO | Admitting: Internal Medicine

## 2021-10-28 ENCOUNTER — Encounter: Payer: Self-pay | Admitting: Internal Medicine

## 2021-10-28 VITALS — BP 120/70 | HR 67 | Ht 70.0 in | Wt 213.0 lb

## 2021-10-28 DIAGNOSIS — E1122 Type 2 diabetes mellitus with diabetic chronic kidney disease: Secondary | ICD-10-CM

## 2021-10-28 DIAGNOSIS — Z794 Long term (current) use of insulin: Secondary | ICD-10-CM

## 2021-10-28 DIAGNOSIS — E1142 Type 2 diabetes mellitus with diabetic polyneuropathy: Secondary | ICD-10-CM

## 2021-10-28 DIAGNOSIS — E1165 Type 2 diabetes mellitus with hyperglycemia: Secondary | ICD-10-CM | POA: Diagnosis not present

## 2021-10-28 DIAGNOSIS — E1159 Type 2 diabetes mellitus with other circulatory complications: Secondary | ICD-10-CM

## 2021-10-28 DIAGNOSIS — E118 Type 2 diabetes mellitus with unspecified complications: Secondary | ICD-10-CM | POA: Diagnosis not present

## 2021-10-28 DIAGNOSIS — N1831 Chronic kidney disease, stage 3a: Secondary | ICD-10-CM

## 2021-10-28 LAB — POCT GLYCOSYLATED HEMOGLOBIN (HGB A1C): Hemoglobin A1C: 7.7 % — AB (ref 4.0–5.6)

## 2021-10-28 MED ORDER — NOVOLOG FLEXPEN 100 UNIT/ML ~~LOC~~ SOPN: PEN_INJECTOR | SUBCUTANEOUS | 3 refills | Status: DC

## 2021-10-28 MED ORDER — INSULIN PEN NEEDLE 32G X 4 MM MISC: 1.0000 | Freq: Four times a day (QID) | 3 refills | Status: DC

## 2021-10-28 MED ORDER — BASAGLAR KWIKPEN 100 UNIT/ML ~~LOC~~ SOPN: 22.0000 [IU] | PEN_INJECTOR | Freq: Every day | SUBCUTANEOUS | 3 refills | Status: DC

## 2021-10-28 NOTE — Patient Instructions (Addendum)
-   Continue Farxiga 10 mg , 1 tablet every morning  - Increase Basaglar 22 units daily  - Continue  Novolog to 14 units with Breakfast, 14 units with Lunch and 14 units with Supper   Novolog correctional insulin: ADD extra units on insulin to your meal-time Novolog dose if your blood sugars are higher than 185. Use the scale below to help guide you:   Blood sugar before meal Number of units to inject  Less than 185 0 unit  186 -  210 1 units  211 -  235 2 units  236 -  260 3 units  261 -  285 4 units  286 -  310 5 units  311 -  335 6 units  336 -  360 7 units  361 -  385 8 units  386 - 410  9 units       HOW TO TREAT LOW BLOOD SUGARS (Blood sugar LESS THAN 70 MG/DL) Please follow the RULE OF 15 for the treatment of hypoglycemia treatment (when your (blood sugars are less than 70 mg/dL)   STEP 1: Take 15 grams of carbohydrates when your blood sugar is low, which includes:  3-4 GLUCOSE TABS  OR 3-4 OZ OF JUICE OR REGULAR SODA OR ONE TUBE OF GLUCOSE GEL    STEP 2: RECHECK blood sugar in 15 MINUTES STEP 3: If your blood sugar is still low at the 15 minute recheck --> then, go back to STEP 1 and treat AGAIN with another 15 grams of carbohydrates.

## 2021-10-28 NOTE — Progress Notes (Unsigned)
Name: [name]  Age/ Sex: 76 y.o., male   MRN/ DOB: [id number], 1945-07-25     PCP: [name], NP   Reason for Endocrinology Evaluation: Type 2 Diabetes Mellitus  Initial Endocrine Consultative Visit: 11/11/2018    PATIENT IDENTIFIER: [name] is a 76 y.o. male with a past medical history of T2DM, OSA, HTN, CAD ( S/P CABG) . The patient has followed with Endocrinology clinic since 11/11/2018 for consultative assistance with management of his diabetes.  DIABETIC HISTORY:  [name] was diagnosed with T2DM in 2005. He was on metformin which was stopped due to renal dysfunction . Has been on insulin since 2015. His hemoglobin A1c has ranged from 6.3% in 2015, peaking at 8.3% in 2020.  On his initial visit to our clinic he was on basal insulin only. With an A1c of 8.3. Trulicity was added.    Novolog started 04/2020 due to an A1c 4.1%  Stopped trulicity due to cost  Started Farxiga 07/2020 through pt assistance program   SUBJECTIVE:   During the last visit (04/26/2021): A1c of 8.0 %.    Today (10/28/2021): [name] is here for a  follow up on diabetes management. He is accompanied by his wife today . He checks his blood sugars multiple  times daily, through dexcom.  The patient has not had hypoglycemic episodes since the last clinic visit.   Patient presented to the ED in May 2023 with aphasia, due to acute embolic CVA  was evaluated by cardiology in 03/2021 S/P aortic valve replacement, on Coumadin and CAD as well as dilated ascending aorta He continues to follow-up with pulmonary for OSA Had an ED visit 10/08/2021 for MI    Denies chest pain or  SOB    HOME DIABETES REGIMEN:  Basaglar 20  units daily  Novolog 14 units with breakfast and lunch and 14 units with supper  Farxiga 10 mg daily        CONTINUOUS GLUCOSE MONITORING RECORD INTERPRETATION    Dates of Recording: 9/16-9/29/2023  Sensor description: dexcom   Results  statistics:   CGM use % of time 93  Average and SD 177/45  Time in range   56 %  % Time Above 180 36  % Time above 250 7  % Time Below target <1      Glycemic patterns summary: Bg's elevated overnight and during the day  Hyperglycemic episodes  postprandial   Hypoglycemic episodes occurred  N/A  Overnight periods: Remains high    DIABETIC COMPLICATIONS: Microvascular complications:  CKDIII- Dr. Hollie Salk  Denies: neuropathy, retinopathy  Last eye exam: Completed 03/25/2020    Macrovascular complications:  CAD (S/P CABG )  Denies: PVD, CVA    HISTORY:  Past Medical History:  Past Medical History:  Diagnosis Date   Actinic keratosis 02/14/2011   Allergy    Aortic stenosis    Arthritis    Bilateral sensorineural hearing loss 06/16/2009   CAD (coronary artery disease)    cabg   Complication of anesthesia    Coronary atherosclerosis of native coronary artery 06/15/2009   Diabetic polyneuropathy 11/11/2018   Dyslipidemia 74/08/1446   Embolic stroke involving left middle cerebral artery  05/30/2021   Erectile dysfunction 05/18/2009   Essential hypertension 06/16/2009   Expressive aphasia 06/25/2021   External hemorrhoids 03/21/2010   Gout 04/09/2015   Heart murmur    Hemochromatosis 02/14/2011   History of aortic valve replacement 10/13/2009   History of CABG  11/05/2018   2005- 1. Coronary artery bypass grafting x4 with LIMA-LAD, SVG-OM1, SVG-AM and dCFX.  2. Aortic valve replacement with St Jude AVR 3. Septal myomectomy.  4. Myoview low risk 2016   History of hepatitis 01/31/1972   Hypertriglyceridemia 06/13/2010   Hypertrophic obstructive cardiomyopathy 05/06/2008   SP septal myomectomy at surgery 2005 Echo Nov 2018- Hyperdynamic LVEF with severe basal septal hypertrophy. There is chordal SAM with resting gradient of 16 mmHg that increases to 85 mmHg with Valsalva        Leg weakness, bilateral 01/18/2021   Long term (current) use of anticoagulants 05/22/2018    Lumbar stenosis with neurogenic claudication 02/04/2021   Malignant neoplasm of prostate 06/17/2009   Mechanical heart valve present    Manufacturer: Sherlean Foot #: [id number]  Model #: [id number]. Card states MRI compatible with 3 teslas or less.   Obesity, unspecified 04/29/2008   OSA (obstructive sleep apnea) 07/12/2012   moderate-severe; uses CPAP nightly   PONV (postoperative nausea and vomiting)    after CABG- slow to wake up   Presence of prosthetic heart valve    Primary osteoarthritis of left shoulder 10/31/2018   S/P lumbar laminectomy 02/04/2021   Stage 3 chronic kidney disease due to type 2 diabetes mellitus 11/11/2018   Tobacco use 06/16/2009   Type 2 diabetes mellitus with hyperglycemia    Ventral hernia 03/03/2014   Past Surgical History:  Past Surgical History:  Procedure Laterality Date   AORTIC VALVE REPLACEMENT  11/14/2003   St Jude [place]   CORONARY ANGIOGRAPHY  10/11/2021   CORONARY ARTERY BYPASS GRAFT  10/2003   [place]   right, inguinal   HERNIA REPAIR  2002   left, inguinal   HIP SURGERY  2006   right hip   LUMBAR LAMINECTOMY/DECOMPRESSION MICRODISCECTOMY Bilateral 02/04/2021   Procedure: Bilateral Lumbar Two-Three Laminectomy;  Surgeon: Kristeen Miss, MD;  Location: [facility];  Service: Neurosurgery;  Laterality: Bilateral;  3C/RM 20   PILONIDAL CYST EXCISION  1964   prostate seed implant  03/2010   TONSILLECTOMY  childhood   Social History:  reports that he quit smoking about 3 months ago. His smoking use included cigars. He has never used smokeless tobacco. He reports that he does not currently use alcohol. He reports that he does not use drugs. Family History:  Family History  Problem Relation Age of Onset   Heart disease Mother    Stroke Mother    Heart disease Father    Asperger's syndrome Son    Hyperlipidemia Son    Coronary artery disease Brother    Cancer Neg Hx        negative  for colon cancer     HOME MEDICATIONS: Allergies as of 10/28/2021   No Known Allergies      Medication List        Accurate as of October 28, 2021  7:04 AM. If you have any questions, ask your nurse or doctor.          AMBULATORY NON FORMULARY MEDICATION cpap cushions            AirFit F20 (Size: Large)           AirFit F30 (Size: Med)   Please FAX the prescription to:  [phone]   amLODipine 5 MG tablet Commonly known as: NORVASC Take 1 tablet (5 mg total) by mouth daily.   Basaglar KwikPen 100  UNIT/ML INJECT 38 UNITS INTO THE SKIN DAILY. What changed: See the new instructions.   betamethasone dipropionate 0.05 % cream Apply topically 2 (two) times daily as needed. To eczema rash   Blood Glucose Monitoring Suppl Supplies Misc Use for monitoring glucose level   cephALEXin 500 MG capsule Commonly known as: KEFLEX Take 1 capsule (500 mg total) by mouth 4 (four) times daily.   clopidogrel 75 MG tablet Commonly known as: PLAVIX Take 1 tablet (75 mg total) by mouth daily.   colchicine 0.6 MG tablet Take 1 tablet (0.6 mg total) by mouth 2 (two) times daily as needed.   dapagliflozin propanediol 10 MG Tabs tablet Commonly known as: Farxiga Take 1 tablet (10 mg total) by mouth daily before breakfast.   Dexcom G6 Sensor Misc 1 Device by Does not apply route as directed.   Dexcom G6 Transmitter Misc 1 Device by Does not apply route as directed.   fenofibrate 145 MG tablet Commonly known as: TRICOR TAKE 1 TABLET BY MOUTH EVERY DAY   furosemide 20 MG tablet Commonly known as: LASIX TAKE 1 TABLET BY MOUTH EVERY DAY AS NEEDED FOR SWELLING What changed: See the new instructions.   gabapentin 100 MG capsule Commonly known as: NEURONTIN Take 100 mg by mouth every 12 (twelve) hours.   Insulin Pen Needle 32G X 4 MM Misc 1 Device by Does not apply route in the morning, at noon, in the evening, and at bedtime.   isosorbide mononitrate 30 MG 24 hr  tablet Commonly known as: IMDUR Take 1 tablet (30 mg total) by mouth daily.   metoprolol succinate 25 MG 24 hr tablet Commonly known as: TOPROL-XL TAKE 1 TABLET BY MOUTH EVERY DAY   NovoLOG FlexPen 100 UNIT/ML FlexPen Generic drug: insulin aspart Max daily 70 units What changed: additional instructions   OneTouch Delica Lancets 93X Misc USE AS DIRECTED   OneTouch Verio test strip Generic drug: glucose blood Use as instructed   predniSONE 10 MG tablet Commonly known as: DELTASONE 2 tabs a day x 5 days   rosuvastatin 40 MG tablet Commonly known as: CRESTOR Take 1 tablet (40 mg total) by mouth daily. What changed: when to take this   tacrolimus 0.1 % ointment Commonly known as: PROTOPIC Apply 1 application  topically daily as needed (irritation).   warfarin 7.5 MG tablet Commonly known as: COUMADIN Take as directed by the anticoagulation clinic. If you are unsure how to take this medication, talk to your nurse or doctor. Original instructions: TAKE 1/2 A TABLET TO 1 TABLET BY MOUTH DAILY AS DIRECTED BY THE COUMADIN CLINIC         OBJECTIVE:   Vital Signs: BP 120/70 (BP Location: Left Arm, Patient Position: Sitting, Cuff Size: Large)   Pulse 67   Ht '5\' 10"'$  (1.778 m)   Wt 213 lb (96.6 kg)   SpO2 95%   BMI 30.56 kg/m   Wt Readings from Last 3 Encounters:  10/20/21 218 lb (98.9 kg)  10/19/21 218 lb 8 oz (99.1 kg)  10/04/21 218 lb (98.9 kg)     Exam: General: Pt appears well and is in NAD  Lungs: Clear with good BS bilat with no rales, rhonchi, or wheezes  Heart: RRR with normal S1 and S2 and no gallops; no murmurs; no rub  Extremities: Trace  pretibial edema.   Neuro: MS is good with appropriate affect, pt is alert and Ox3      DM Foot Exam 04/26/2021  The skin of  the feet is intact without sores or ulcerations. The pedal pulses are 1+ on right and 1+ on left. The sensation is intact to a screening 5.07, 10 gram monofilament bilaterally    DATA  REVIEWED:  Lab Results  Component Value Date   HGBA1C 8.3 (H) 06/25/2021   HGBA1C 8.0 (A) 04/26/2021   HGBA1C 9.2 (A) 12/21/2020     Latest Reference Range & Units 06/26/21 02:12  Sodium 135 - 145 mmol/L 138  Potassium 3.5 - 5.1 mmol/L 3.9  Chloride 98 - 111 mmol/L 106  CO2 22 - 32 mmol/L 23  Glucose 70 - 99 mg/dL 170 (H)  BUN 8 - 23 mg/dL 25 (H)  Creatinine 0.61 - 1.24 mg/dL 1.51 (H)  Calcium 8.9 - 10.3 mg/dL 9.2  Anion gap 5 - 15  9  GFR, Estimated >60 mL/min 48 (L)      ASSESSMENT / PLAN / RECOMMENDATIONS:   1) Type 2 Diabetes Mellitus, Poorly controlled, With CKD III , Neuropathic and Macrovascular complications - Most recent A1c of 7.7 %. Goal A1c < 7.5%.   -Pt continues with improved glycemic control - I have reviewed his CGM download and it shows persistent hyperglycemia overnight, will increase basal rate as below  - NO changes to his prandial nor Correction scale for now  - GLP-1 agonists have been cost prohibitive and did not improve his glycemic control    MEDICATIONS: - Continue  Farxiga 10 mg , 1 tablet every morning  - Increase  Basaglar 22 units daily  - Continue Novolog 14 units 3 times daily before every meal - Correction Scale : Humalog (BG -130/25)     EDUCATION / INSTRUCTIONS: BG monitoring instructions: Patient is instructed to check his blood sugars 3 times a day, before each meal. Call [facility] if: BG persistently < 70 I reviewed the Rule of 15 for the treatment of hypoglycemia in detail with the patient. Literature supplied.   2) Diabetic complications:  Eye: Does not have known diabetic retinopathy. Neuro/ Feet: Does have known diabetic peripheral neuropathy. Renal: Patient does have known baseline CKD. He is on an ACEI/ARB at present. He sees Dr. Hollie Salk    F/U in 6 months    Signed electronically by: Mack Guise, MD  [facility]  [facility] [address] [place], [place] [address] Phone: [phone] FAX: [phone]   CC: [name], NP [address] [place] [address] Phone: [phone]  Fax: [phone]  Return to Endocrinology clinic as below: Future Appointments  Date Time Provider [place]  10/28/2021 10:50 AM Ronda Rajkumar, Melanie Crazier, MD [facility] None  11/04/2021 11:45 AM [facility] None  11/18/2021 10:20 AM Lelon Perla, MD [facility] None  01/04/2022 11:20 AM [name], NP [facility]  03/07/2022 11:00 AM Rondel Jumbo, PA-C [facility] None

## 2021-10-30 DIAGNOSIS — E119 Type 2 diabetes mellitus without complications: Secondary | ICD-10-CM | POA: Insufficient documentation

## 2021-10-30 DIAGNOSIS — E1142 Type 2 diabetes mellitus with diabetic polyneuropathy: Secondary | ICD-10-CM | POA: Insufficient documentation

## 2021-10-30 DIAGNOSIS — E1122 Type 2 diabetes mellitus with diabetic chronic kidney disease: Secondary | ICD-10-CM | POA: Insufficient documentation

## 2021-10-31 ENCOUNTER — Encounter: Payer: Self-pay | Admitting: Internal Medicine

## 2021-10-31 DIAGNOSIS — H0102A Squamous blepharitis right eye, upper and lower eyelids: Secondary | ICD-10-CM | POA: Diagnosis not present

## 2021-10-31 DIAGNOSIS — H2513 Age-related nuclear cataract, bilateral: Secondary | ICD-10-CM | POA: Diagnosis not present

## 2021-10-31 DIAGNOSIS — H35033 Hypertensive retinopathy, bilateral: Secondary | ICD-10-CM | POA: Diagnosis not present

## 2021-10-31 DIAGNOSIS — E119 Type 2 diabetes mellitus without complications: Secondary | ICD-10-CM | POA: Diagnosis not present

## 2021-10-31 DIAGNOSIS — G43809 Other migraine, not intractable, without status migrainosus: Secondary | ICD-10-CM | POA: Diagnosis not present

## 2021-11-04 ENCOUNTER — Ambulatory Visit: Payer: Medicare HMO | Attending: Cardiovascular Disease

## 2021-11-04 DIAGNOSIS — Z5181 Encounter for therapeutic drug level monitoring: Secondary | ICD-10-CM

## 2021-11-04 DIAGNOSIS — Z952 Presence of prosthetic heart valve: Secondary | ICD-10-CM | POA: Diagnosis not present

## 2021-11-04 LAB — POCT INR: INR: 3.6 — AB (ref 2.0–3.0)

## 2021-11-04 NOTE — Patient Instructions (Signed)
Continue taking warfarin 1 tablet daily except for 1/2 tablet on Mondays, Wednesdays, and Fridays. Continue consistency with leafy green intake. Resume normal leafy intake. Recheck INR in 4 weeks. Coumadin Clinic [phone] or [phone].  

## 2021-11-04 NOTE — Progress Notes (Signed)
HPI: Followup of coronary artery disease. Patient is status post coronary artery bypassing graft (LIMA to the LAD, saphenous vein graft to the marginal, saphenous vein graft sequentially to the right coronary artery and distal circumflex) as well as aortic valve replacement (St. Jude aortic valve) in 2005. He also had a septal myectomy at that time. Abdominal CT April 2015 showed no aneurysm. Carotid Dopplers November 2022 showed no significant stenosis.  Had CVA May 2023. Patient had non-ST elevation myocardial infarction September 2023 in [place].  Patient presented with atrial fibrillation at that time.  Cardiac catheterization revealed severe three-vessel coronary artery disease; patent LIMA to the LAD with 80% stenosis in the very distal LAD; occluded saphenous vein graft to the left circumflex/right coronary artery and occluded saphenous vein graft to the OM; treated medically.  Echocardiogram showed normal LV function, grade 1 diastolic dysfunction, mild mitral regurgitation, mild tricuspid regurgitation, status post aortic valve replacement with mean gradient across the aortic valve of 8 mmHg and dilated aorta at 4.4 cm.  Since last seen, patient denies recurrent chest pain, dyspnea, palpitations or syncope.  No pedal edema.  Current Outpatient Medications  Medication Sig Dispense Refill   AMBULATORY NON FORMULARY MEDICATION cpap cushions            AirFit F20 (Size: Large)           AirFit F30 (Size: Med)   Please FAX the prescription to:  [phone] each prn   amLODipine (NORVASC) 5 MG tablet Take 1 tablet (5 mg total) by mouth daily. 30 tablet 1   betamethasone dipropionate (DIPROLENE) 0.05 % cream Apply topically 2 (two) times daily as needed. To eczema rash 30 g 1   Blood Glucose Monitoring Suppl Supplies MISC Use for monitoring glucose level 100 each 1   cephALEXin (KEFLEX) 500 MG capsule Take 1 capsule (500 mg total) by mouth 4 (four) times daily. 28 capsule 0    clopidogrel (PLAVIX) 75 MG tablet Take 1 tablet (75 mg total) by mouth daily. 30 tablet 1   colchicine 0.6 MG tablet TAKE 1 TABLET (0.6 MG TOTAL) BY MOUTH 2 (TWO) TIMES DAILY AS NEEDED. 180 tablet 0   Continuous Blood Gluc Sensor (DEXCOM G6 SENSOR) MISC 1 Device by Does not apply route as directed. 9 each 3   Continuous Blood Gluc Transmit (DEXCOM G6 TRANSMITTER) MISC 1 Device by Does not apply route as directed. 1 each 3   dapagliflozin propanediol (FARXIGA) 10 MG TABS tablet Take 1 tablet (10 mg total) by mouth daily before breakfast. 90 tablet 3   fenofibrate (TRICOR) 145 MG tablet TAKE 1 TABLET BY MOUTH EVERY DAY 90 tablet 1   furosemide (LASIX) 20 MG tablet TAKE 1 TABLET BY MOUTH EVERY DAY AS NEEDED FOR SWELLING 90 tablet 1   glucose blood (ONETOUCH VERIO) test strip Use as instructed 100 strip 12   insulin aspart (NOVOLOG FLEXPEN) 100 UNIT/ML FlexPen Max daily 70 units 75 mL 3   Insulin Glargine (BASAGLAR KWIKPEN) 100 UNIT/ML Inject 22 Units into the skin daily. 30 mL 3   Insulin Pen Needle 32G X 4 MM MISC 1 Device by Does not apply route in the morning, at noon, in the evening, and at bedtime. 400 each 3   isosorbide mononitrate (IMDUR) 30 MG 24 hr tablet Take 1 tablet (30 mg total) by mouth daily. 30 tablet 1   metoprolol succinate (TOPROL-XL) 25 MG 24 hr tablet TAKE 1 TABLET BY MOUTH EVERY DAY 90 tablet  1   OneTouch Delica Lancets 85I MISC USE AS DIRECTED 100 each 1   rosuvastatin (CRESTOR) 40 MG tablet Take 1 tablet (40 mg total) by mouth daily. (Patient taking differently: Take 40 mg by mouth every evening.) 90 tablet 3   tacrolimus (PROTOPIC) 0.1 % ointment Apply 1 application  topically daily as needed (irritation).     warfarin (COUMADIN) 7.5 MG tablet TAKE 1/2 A TABLET TO 1 TABLET BY MOUTH DAILY AS DIRECTED BY THE COUMADIN CLINIC 105 tablet 1   erythromycin ophthalmic ointment 1 Application at bedtime.     predniSONE (DELTASONE) 10 MG tablet 2 tabs a day x 5 days 10 tablet 0   No  current facility-administered medications for this visit.     Past Medical History:  Diagnosis Date   Actinic keratosis 02/14/2011   Allergy    Aortic stenosis    Arthritis    Bilateral sensorineural hearing loss 06/16/2009   CAD (coronary artery disease)    cabg   Complication of anesthesia    Coronary atherosclerosis of native coronary artery 06/15/2009   Diabetic polyneuropathy 11/11/2018   Dyslipidemia 62/70/3500   Embolic stroke involving left middle cerebral artery  05/30/2021   Erectile dysfunction 05/18/2009   Essential hypertension 06/16/2009   Expressive aphasia 06/25/2021   External hemorrhoids 03/21/2010   Gout 04/09/2015   Heart murmur    Hemochromatosis 02/14/2011   History of aortic valve replacement 10/13/2009   History of CABG 11/05/2018   2005- 1. Coronary artery bypass grafting x4 with LIMA-LAD, SVG-OM1, SVG-AM and dCFX.  2. Aortic valve replacement with St Jude AVR 3. Septal myomectomy.  4. Myoview low risk 2016   History of hepatitis 01/31/1972   Hypertriglyceridemia 06/13/2010   Hypertrophic obstructive cardiomyopathy 05/06/2008   SP septal myomectomy at surgery 2005 Echo Nov 2018- Hyperdynamic LVEF with severe basal septal hypertrophy. There is chordal SAM with resting gradient of 16 mmHg that increases to 85 mmHg with Valsalva        Leg weakness, bilateral 01/18/2021   Long term (current) use of anticoagulants 05/22/2018   Lumbar stenosis with neurogenic claudication 02/04/2021   Malignant neoplasm of prostate 06/17/2009   Mechanical heart valve present    Manufacturer: Sherlean Foot #: [id number]  Model #: [id number]. Card states MRI compatible with 3 teslas or less.   Obesity, unspecified 04/29/2008   OSA (obstructive sleep apnea) 07/12/2012   moderate-severe; uses CPAP nightly   PONV (postoperative nausea and vomiting)    after CABG- slow to wake up   Presence of prosthetic heart valve    Primary osteoarthritis of left shoulder 10/31/2018    S/P lumbar laminectomy 02/04/2021   Stage 3 chronic kidney disease due to type 2 diabetes mellitus 11/11/2018   Tobacco use 06/16/2009   Type 2 diabetes mellitus with hyperglycemia    Ventral hernia 03/03/2014    Past Surgical History:  Procedure Laterality Date   AORTIC VALVE REPLACEMENT  11/14/2003   [facility]   APPENDECTOMY  [place]   CORONARY ANGIOGRAPHY  10/11/2021   CORONARY ARTERY BYPASS GRAFT  10/2003   [place]   right, inguinal   HERNIA REPAIR  2002   left, inguinal   HIP SURGERY  2006   right hip   LUMBAR LAMINECTOMY/DECOMPRESSION MICRODISCECTOMY Bilateral 02/04/2021   Procedure: Bilateral Lumbar Two-Three Laminectomy;  Surgeon: Kristeen Miss, MD;  Location: [facility];  Service: Neurosurgery;  Laterality: Bilateral;  3C/RM 20  PILONIDAL CYST EXCISION  1964   prostate seed implant  03/2010   TONSILLECTOMY  childhood    Social History   Socioeconomic History   Marital status: Married    Spouse name: Not on file   Number of children: Not on file   Years of education: 18   Highest education level: Master's degree (e.g., MA, MS, MEng, MEd, MSW, MBA)  Occupational History   Occupation: Retired  Tobacco Use   Smoking status: Former    Types: Cigars    Quit date: 06/2021    Years since quitting: 0.3   Smokeless tobacco: Never   Tobacco comments:    Pt states he smokes 1 cigar about once a month. 06/01/2021    Pt quit  Cigars 06/2021  Substance and Sexual Activity   Alcohol use: Not Currently    Comment: wine maybe 1 per month   Drug use: No   Sexual activity: Never  Other Topics Concern   Not on file  Social History Narrative   Right Handed    Lives in a one sotyr home. One step leads to front door.    Social Determinants of Health   Financial Resource Strain: Not on file  Food Insecurity: Not on file  Transportation Needs: Not on file  Physical Activity: Not on file  Stress: Not on file  Social Connections: Not on  file  Intimate Partner Violence: Not on file    Family History  Problem Relation Age of Onset   Heart disease Mother    Stroke Mother    Heart disease Father    Asperger's syndrome Son    Hyperlipidemia Son    Coronary artery disease Brother    Cancer Neg Hx        negative for colon cancer    ROS: no fevers or chills, productive cough, hemoptysis, dysphasia, odynophagia, melena, hematochezia, dysuria, hematuria, rash, seizure activity, orthopnea, PND, pedal edema, claudication. Remaining systems are negative.  Physical Exam: Well-developed well-nourished in no acute distress.  Skin is warm and dry.  HEENT is normal.  Neck is supple.  Chest is clear to auscultation with normal expansion.  Cardiovascular exam is regular rate and rhythm.  Crisp mechanical valve sound. Abdominal exam nontender or distended. No masses palpated. Extremities show no edema. neuro grossly intact   A/P  1 status post AVR-continue Coumadin.  Continue SBE prophylaxis.  I personally reviewed the patient's recent echocardiogram from [place].  2 coronary artery disease-patient denies chest pain.  Continue statin.  I personally reviewed his recent catheterization films from [place] with Dr. Claiborne Billings.  We feel the best option at this point is medical therapy.  3 hypertension-blood pressure controlled.  Continue present medical regimen.  4 hyperlipidemia-continue statin.  5 history of hypertrophic cardiomyopathy-status post myectomy.  No gradient noted on follow-up echocardiogram.  Continue beta-blocker.  6 dilated ascending aorta-we will arrange CTA to better assess.  7 paroxysmal atrial fibrillation-patient presented with paroxysmal atrial fibrillation in [place].  He is in sinus rhythm.  We will continue beta-blocker and Coumadin.  If he has more frequent episodes in the future we will consider addition of antiarrhythmic.  This was likely the cause of his non-ST elevation myocardial  infarction due to demand ischemia.  Kirk Ruths, MD

## 2021-11-08 ENCOUNTER — Other Ambulatory Visit: Payer: Self-pay | Admitting: Family

## 2021-11-08 DIAGNOSIS — E1159 Type 2 diabetes mellitus with other circulatory complications: Secondary | ICD-10-CM

## 2021-11-10 ENCOUNTER — Other Ambulatory Visit: Payer: Self-pay | Admitting: Internal Medicine

## 2021-11-15 ENCOUNTER — Encounter: Payer: Self-pay | Admitting: Cardiology

## 2021-11-18 ENCOUNTER — Ambulatory Visit: Payer: Medicare HMO | Attending: Cardiology | Admitting: Cardiology

## 2021-11-18 ENCOUNTER — Encounter: Payer: Self-pay | Admitting: Cardiology

## 2021-11-18 VITALS — BP 116/62 | HR 70 | Wt 218.0 lb

## 2021-11-18 DIAGNOSIS — Z952 Presence of prosthetic heart valve: Secondary | ICD-10-CM

## 2021-11-18 DIAGNOSIS — I480 Paroxysmal atrial fibrillation: Secondary | ICD-10-CM | POA: Diagnosis not present

## 2021-11-18 DIAGNOSIS — E785 Hyperlipidemia, unspecified: Secondary | ICD-10-CM | POA: Diagnosis not present

## 2021-11-18 DIAGNOSIS — E119 Type 2 diabetes mellitus without complications: Secondary | ICD-10-CM | POA: Diagnosis not present

## 2021-11-18 DIAGNOSIS — I25810 Atherosclerosis of coronary artery bypass graft(s) without angina pectoris: Secondary | ICD-10-CM

## 2021-11-18 DIAGNOSIS — I7121 Aneurysm of the ascending aorta, without rupture: Secondary | ICD-10-CM

## 2021-11-18 DIAGNOSIS — I10 Essential (primary) hypertension: Secondary | ICD-10-CM

## 2021-11-18 NOTE — Patient Instructions (Addendum)
CTA OF THE CHEST TO SIZE AORTA AT [place]: At [facility], you and your health needs are our priority.  As part of our continuing mission to provide you with exceptional heart care, we have created designated Provider Care Teams.  These Care Teams include your primary Cardiologist (physician) and Advanced Practice Providers (APPs -  Physician Assistants and Nurse Practitioners) who all work together to provide you with the care you need, when you need it.  We recommend signing up for the patient portal called "MyChart".  Sign up information is provided on this After Visit Summary.  MyChart is used to connect with patients for Virtual Visits (Telemedicine).  Patients are able to view lab/test results, encounter notes, upcoming appointments, etc.  Non-urgent messages can be sent to your provider as well.   To learn more about what you can do with MyChart, go to [email or web address].    Your next appointment:   3 month(s)  The format for your next appointment:   In Person  Provider:   Kirk Ruths, MD

## 2021-11-20 DIAGNOSIS — G4733 Obstructive sleep apnea (adult) (pediatric): Secondary | ICD-10-CM | POA: Diagnosis not present

## 2021-11-20 DIAGNOSIS — I10 Essential (primary) hypertension: Secondary | ICD-10-CM | POA: Diagnosis not present

## 2021-11-20 DIAGNOSIS — R0681 Apnea, not elsewhere classified: Secondary | ICD-10-CM | POA: Diagnosis not present

## 2021-11-20 DIAGNOSIS — R0683 Snoring: Secondary | ICD-10-CM | POA: Diagnosis not present

## 2021-11-21 ENCOUNTER — Encounter: Payer: Self-pay | Admitting: Internal Medicine

## 2021-11-23 ENCOUNTER — Ambulatory Visit
Admission: RE | Admit: 2021-11-23 | Discharge: 2021-11-23 | Disposition: A | Discharge status: Home / Self Care | Payer: Medicare HMO | Source: Ambulatory Visit | Attending: Cardiology | Admitting: Cardiology

## 2021-11-23 DIAGNOSIS — I2510 Atherosclerotic heart disease of native coronary artery without angina pectoris: Secondary | ICD-10-CM | POA: Diagnosis not present

## 2021-11-23 DIAGNOSIS — I7121 Aneurysm of the ascending aorta, without rupture: Secondary | ICD-10-CM

## 2021-11-23 DIAGNOSIS — I712 Thoracic aortic aneurysm, without rupture, unspecified: Secondary | ICD-10-CM | POA: Diagnosis not present

## 2021-11-23 DIAGNOSIS — I700 Atherosclerosis of aorta: Secondary | ICD-10-CM | POA: Diagnosis not present

## 2021-11-23 DIAGNOSIS — R918 Other nonspecific abnormal finding of lung field: Secondary | ICD-10-CM | POA: Diagnosis not present

## 2021-11-23 MED ORDER — IOPAMIDOL (ISOVUE-370) INJECTION 76%
75.0000 mL | Freq: Once | INTRAVENOUS | Status: AC | PRN
  Administered 2021-11-23: 75 mL via INTRAVENOUS

## 2021-11-24 ENCOUNTER — Encounter: Payer: Self-pay | Admitting: Cardiology

## 2021-11-28 DIAGNOSIS — I10 Essential (primary) hypertension: Secondary | ICD-10-CM | POA: Diagnosis not present

## 2021-11-28 DIAGNOSIS — G4733 Obstructive sleep apnea (adult) (pediatric): Secondary | ICD-10-CM | POA: Diagnosis not present

## 2021-11-29 ENCOUNTER — Telehealth: Payer: Self-pay | Admitting: *Deleted

## 2021-11-29 NOTE — Chronic Care Management (AMB) (Signed)
  Care Coordination  Outreach Note  11/29/2021 Name: [name] MRN: [id number] DOB: 09/04/1945   Care Coordination Outreach Attempts: An unsuccessful telephone outreach was attempted today to offer the patient information about available care coordination services as a benefit of their health plan.   Follow Up Plan:  Additional outreach attempts will be made to offer the patient care coordination information and services.   Encounter Outcome:  No Answer  Julian Hy, [place] Direct Dial: [phone]

## 2021-11-30 ENCOUNTER — Other Ambulatory Visit: Payer: Self-pay | Admitting: Cardiology

## 2021-11-30 DIAGNOSIS — E785 Hyperlipidemia, unspecified: Secondary | ICD-10-CM

## 2021-12-02 ENCOUNTER — Encounter: Payer: Self-pay | Admitting: Pharmacist Clinician (PhC)/ Clinical Pharmacy Specialist

## 2021-12-02 ENCOUNTER — Ambulatory Visit: Payer: Medicare HMO

## 2021-12-02 ENCOUNTER — Telehealth (INDEPENDENT_AMBULATORY_CARE_PROVIDER_SITE_OTHER): Payer: Medicare HMO | Admitting: Family Medicine

## 2021-12-02 ENCOUNTER — Encounter: Payer: Self-pay | Admitting: Family Medicine

## 2021-12-02 ENCOUNTER — Other Ambulatory Visit: Payer: Self-pay | Admitting: General Practice

## 2021-12-02 DIAGNOSIS — U071 COVID-19: Secondary | ICD-10-CM | POA: Diagnosis not present

## 2021-12-02 MED ORDER — MOLNUPIRAVIR EUA 200MG CAPSULE: 4.0000 | ORAL_CAPSULE | Freq: Two times a day (BID) | ORAL | 0 refills | Status: AC

## 2021-12-02 NOTE — Progress Notes (Signed)
Chief Complaint  Patient presents with   Covid Positive    [name] Diver here for URI complaints. Due to COVID-19 pandemic, we are interacting via web portal for an electronic face-to-face visit. I verified patient's ID using 2 identifiers. Patient agreed to proceed with visit via this method. Patient is at home, I am at office. Patient, his wife and I are present for visit.   Duration: 1 day  Associated symptoms: rhinorrhea and fatigue though his CPAP machine just broke Denies: sinus headache, sinus congestion, sinus pain, ear pain, ear drainage, sore throat, wheezing, shortness of breath, myalgia, and cough, N/V/D, loss of taste/smell Treatment to date: none Sick contacts: Yes; spouse Tested + for covid today.   Past Medical History:  Diagnosis Date   Actinic keratosis 02/14/2011   Allergy    Aortic stenosis    Arthritis    Bilateral sensorineural hearing loss 06/16/2009   CAD (coronary artery disease)    cabg   Complication of anesthesia    Coronary atherosclerosis of native coronary artery 06/15/2009   Diabetic polyneuropathy 11/11/2018   Dyslipidemia 30/09/2328   Embolic stroke involving left middle cerebral artery  05/30/2021   Erectile dysfunction 05/18/2009   Essential hypertension 06/16/2009   Expressive aphasia 06/25/2021   External hemorrhoids 03/21/2010   Gout 04/09/2015   Heart murmur    Hemochromatosis 02/14/2011   History of aortic valve replacement 10/13/2009   History of CABG 11/05/2018   2005- 1. Coronary artery bypass grafting x4 with LIMA-LAD, SVG-OM1, SVG-AM and dCFX.  2. Aortic valve replacement with St Jude AVR 3. Septal myomectomy.  4. Myoview low risk 2016   History of hepatitis 01/31/1972   Hypertriglyceridemia 06/13/2010   Hypertrophic obstructive cardiomyopathy 05/06/2008   SP septal myomectomy at surgery 2005 Echo Nov 2018- Hyperdynamic LVEF with severe basal septal hypertrophy. There is chordal SAM with resting gradient of 16 mmHg that  increases to 85 mmHg with Valsalva        Leg weakness, bilateral 01/18/2021   Long term (current) use of anticoagulants 05/22/2018   Lumbar stenosis with neurogenic claudication 02/04/2021   Malignant neoplasm of prostate 06/17/2009   Mechanical heart valve present    Manufacturer: Sherlean Foot #: [id number]  Model #: [id number]. Card states MRI compatible with 3 teslas or less.   Obesity, unspecified 04/29/2008   OSA (obstructive sleep apnea) 07/12/2012   moderate-severe; uses CPAP nightly   PONV (postoperative nausea and vomiting)    after CABG- slow to wake up   Presence of prosthetic heart valve    Primary osteoarthritis of left shoulder 10/31/2018   S/P lumbar laminectomy 02/04/2021   Stage 3 chronic kidney disease due to type 2 diabetes mellitus 11/11/2018   Tobacco use 06/16/2009   Type 2 diabetes mellitus with hyperglycemia    Ventral hernia 03/03/2014    Objective No conversational dyspnea Age appropriate judgment and insight Nml affect and mood  COVID-19 - Plan: molnupiravir EUA (LAGEVRIO) 200 mg CAPS capsule  Given meds, will send antiviral, avoid Paxlovid. Continue to push fluids, practice good hand hygiene, cover mouth when coughing. Discussed CDC quarantining guidelines.   F/u prn. If starting to experience irreplaceable fluid loss, shaking, or shortness of breath, seek immediate care. Pt voiced understanding and agreement to the plan.  [place], DO 12/02/21 11:46 AM

## 2021-12-12 ENCOUNTER — Ambulatory Visit: Payer: Medicare HMO | Attending: Cardiology

## 2021-12-12 DIAGNOSIS — Z7901 Long term (current) use of anticoagulants: Secondary | ICD-10-CM | POA: Diagnosis not present

## 2021-12-12 DIAGNOSIS — Z952 Presence of prosthetic heart valve: Secondary | ICD-10-CM | POA: Diagnosis not present

## 2021-12-12 LAB — POCT INR: INR: 4.3 — AB (ref 2.0–3.0)

## 2021-12-12 NOTE — Patient Instructions (Signed)
HOLD TODAY AND TUESDAY then Continue taking warfarin 1 tablet daily except for 1/2 tablet on Mondays, Wednesdays, and Fridays. Continue consistency with leafy green intake. Resume normal leafy intake. Recheck INR in 2 weeks. Coumadin Clinic [phone] or [phone].

## 2021-12-19 DIAGNOSIS — E119 Type 2 diabetes mellitus without complications: Secondary | ICD-10-CM | POA: Diagnosis not present

## 2021-12-21 DIAGNOSIS — R0683 Snoring: Secondary | ICD-10-CM | POA: Diagnosis not present

## 2021-12-21 DIAGNOSIS — I10 Essential (primary) hypertension: Secondary | ICD-10-CM | POA: Diagnosis not present

## 2021-12-21 DIAGNOSIS — G4733 Obstructive sleep apnea (adult) (pediatric): Secondary | ICD-10-CM | POA: Diagnosis not present

## 2021-12-21 DIAGNOSIS — R0681 Apnea, not elsewhere classified: Secondary | ICD-10-CM | POA: Diagnosis not present

## 2021-12-26 ENCOUNTER — Ambulatory Visit: Payer: Medicare HMO | Attending: Cardiology

## 2021-12-26 DIAGNOSIS — Z952 Presence of prosthetic heart valve: Secondary | ICD-10-CM | POA: Diagnosis not present

## 2021-12-26 DIAGNOSIS — Z7901 Long term (current) use of anticoagulants: Secondary | ICD-10-CM

## 2021-12-26 LAB — POCT INR: INR: 2.9 (ref 2.0–3.0)

## 2021-12-26 NOTE — Patient Instructions (Signed)
Continue taking warfarin 1 tablet daily except for 1/2 tablet on Mondays, Wednesdays, and Fridays. Continue consistency with leafy green intake. Resume normal leafy intake. Recheck INR in 4 weeks. Coumadin Clinic [phone] or [phone].

## 2021-12-28 DIAGNOSIS — E1122 Type 2 diabetes mellitus with diabetic chronic kidney disease: Secondary | ICD-10-CM | POA: Diagnosis not present

## 2021-12-28 DIAGNOSIS — N1832 Chronic kidney disease, stage 3b: Secondary | ICD-10-CM | POA: Diagnosis not present

## 2021-12-28 DIAGNOSIS — Z952 Presence of prosthetic heart valve: Secondary | ICD-10-CM | POA: Diagnosis not present

## 2021-12-28 DIAGNOSIS — I639 Cerebral infarction, unspecified: Secondary | ICD-10-CM | POA: Diagnosis not present

## 2021-12-28 DIAGNOSIS — I2510 Atherosclerotic heart disease of native coronary artery without angina pectoris: Secondary | ICD-10-CM | POA: Diagnosis not present

## 2021-12-28 DIAGNOSIS — I129 Hypertensive chronic kidney disease with stage 1 through stage 4 chronic kidney disease, or unspecified chronic kidney disease: Secondary | ICD-10-CM | POA: Diagnosis not present

## 2021-12-29 ENCOUNTER — Encounter: Payer: Self-pay | Admitting: Internal Medicine

## 2021-12-29 DIAGNOSIS — I10 Essential (primary) hypertension: Secondary | ICD-10-CM | POA: Diagnosis not present

## 2021-12-29 DIAGNOSIS — G4733 Obstructive sleep apnea (adult) (pediatric): Secondary | ICD-10-CM | POA: Diagnosis not present

## 2022-01-04 ENCOUNTER — Ambulatory Visit: Payer: Medicare HMO | Admitting: Family

## 2022-01-06 ENCOUNTER — Encounter: Payer: Self-pay | Admitting: Family

## 2022-01-06 ENCOUNTER — Ambulatory Visit (INDEPENDENT_AMBULATORY_CARE_PROVIDER_SITE_OTHER): Payer: Medicare HMO | Admitting: Family

## 2022-01-06 VITALS — BP 124/62 | HR 61 | Temp 97.8°F | Resp 18 | Ht 70.0 in | Wt 219.0 lb

## 2022-01-06 DIAGNOSIS — E1159 Type 2 diabetes mellitus with other circulatory complications: Secondary | ICD-10-CM | POA: Diagnosis not present

## 2022-01-06 DIAGNOSIS — F17200 Nicotine dependence, unspecified, uncomplicated: Secondary | ICD-10-CM

## 2022-01-06 DIAGNOSIS — I69311 Memory deficit following cerebral infarction: Secondary | ICD-10-CM | POA: Diagnosis not present

## 2022-01-06 DIAGNOSIS — C61 Malignant neoplasm of prostate: Secondary | ICD-10-CM

## 2022-01-06 DIAGNOSIS — E785 Hyperlipidemia, unspecified: Secondary | ICD-10-CM

## 2022-01-06 DIAGNOSIS — H903 Sensorineural hearing loss, bilateral: Secondary | ICD-10-CM | POA: Diagnosis not present

## 2022-01-06 DIAGNOSIS — Z952 Presence of prosthetic heart valve: Secondary | ICD-10-CM

## 2022-01-06 DIAGNOSIS — Z794 Long term (current) use of insulin: Secondary | ICD-10-CM | POA: Diagnosis not present

## 2022-01-06 DIAGNOSIS — I421 Obstructive hypertrophic cardiomyopathy: Secondary | ICD-10-CM | POA: Diagnosis not present

## 2022-01-06 DIAGNOSIS — Z6832 Body mass index (BMI) 32.0-32.9, adult: Secondary | ICD-10-CM

## 2022-01-06 DIAGNOSIS — G4733 Obstructive sleep apnea (adult) (pediatric): Secondary | ICD-10-CM

## 2022-01-06 DIAGNOSIS — E781 Pure hyperglyceridemia: Secondary | ICD-10-CM | POA: Diagnosis not present

## 2022-01-06 DIAGNOSIS — I10 Essential (primary) hypertension: Secondary | ICD-10-CM

## 2022-01-06 DIAGNOSIS — I69328 Other speech and language deficits following cerebral infarction: Secondary | ICD-10-CM

## 2022-01-06 DIAGNOSIS — I63412 Cerebral infarction due to embolism of left middle cerebral artery: Secondary | ICD-10-CM

## 2022-01-06 DIAGNOSIS — M1A9XX0 Chronic gout, unspecified, without tophus (tophi): Secondary | ICD-10-CM

## 2022-01-06 DIAGNOSIS — I7121 Aneurysm of the ascending aorta, without rupture: Secondary | ICD-10-CM | POA: Diagnosis not present

## 2022-01-06 DIAGNOSIS — E6609 Other obesity due to excess calories: Secondary | ICD-10-CM

## 2022-01-06 HISTORY — DX: Aneurysm of the ascending aorta, without rupture: I71.21

## 2022-01-06 LAB — LIPID PANEL
Cholesterol: 147 mg/dL (ref 0–200)
HDL: 33.6 mg/dL — ABNORMAL LOW (ref >39.00)
NonHDL: 113.26
Total CHOL/HDL Ratio: 4
Triglycerides: 370 mg/dL — ABNORMAL HIGH (ref 0.0–149.0)
VLDL: 74 mg/dL — ABNORMAL HIGH (ref 0.0–40.0)

## 2022-01-06 LAB — COMPREHENSIVE METABOLIC PANEL
ALT: 16 U/L (ref 0–53)
AST: 22 U/L (ref 0–37)
Albumin: 4.5 g/dL (ref 3.5–5.2)
Alkaline Phosphatase: 49 U/L (ref 39–117)
BUN: 32 mg/dL — ABNORMAL HIGH (ref 6–23)
CO2: 29 mEq/L (ref 19–32)
Calcium: 9.6 mg/dL (ref 8.4–10.5)
Chloride: 100 mEq/L (ref 96–112)
Creatinine, Ser: 1.37 mg/dL (ref 0.40–1.50)
GFR: 50.01 mL/min — ABNORMAL LOW (ref >60.00)
Glucose, Bld: 148 mg/dL — ABNORMAL HIGH (ref 70–99)
Potassium: 4.1 mEq/L (ref 3.5–5.1)
Sodium: 137 mEq/L (ref 135–145)
Total Bilirubin: 0.5 mg/dL (ref 0.2–1.2)
Total Protein: 7.7 g/dL (ref 6.0–8.3)

## 2022-01-06 LAB — LDL CHOLESTEROL, DIRECT: Direct LDL: 71 mg/dL

## 2022-01-06 LAB — FERRITIN: Ferritin: 45.2 ng/mL (ref 22.0–322.0)

## 2022-01-06 NOTE — Assessment & Plan Note (Signed)
Continues coumadin monitoring at Coumadin clinic.

## 2022-01-06 NOTE — Assessment & Plan Note (Signed)
Wt Readings from Last 3 Encounters:  01/06/22 219 lb (99.3 kg)  11/18/21 218 lb (98.9 kg)  10/28/21 213 lb (96.6 kg)   Weight is up a bit.  Discussed diet/exercise.

## 2022-01-06 NOTE — Assessment & Plan Note (Signed)
>>  ASSESSMENT AND PLAN FOR EMBOLIC STROKE INVOLVING LEFT MIDDLE CEREBRAL ARTERY  WRITTEN ON 01/06/2022 11:29 AM BY O'SULLIVAN, [name], NP  Seems to have improved word finding today.  Continues coumadin 2.5-3.5 inr goal at coumadin clinic.

## 2022-01-06 NOTE — Assessment & Plan Note (Signed)
Lab Results  Component Value Date   WBC 7.7 06/26/2021   HGB 15.0 06/26/2021   HCT 43.7 06/26/2021   MCV 88.6 06/26/2021   PLT 227 06/26/2021   Last blood count normal. Check ferritin.

## 2022-01-06 NOTE — Assessment & Plan Note (Signed)
Unchanged

## 2022-01-06 NOTE — Assessment & Plan Note (Signed)
Noted on CT 4.3 cm.  Will be due for follow up CT 10/24.

## 2022-01-06 NOTE — Assessment & Plan Note (Signed)
Lab Results  Component Value Date   HGBA1C 7.7 (A) 10/28/2021   HGBA1C 8.3 (H) 06/25/2021   HGBA1C 8.0 (A) 04/26/2021   Lab Results  Component Value Date   MICROALBUR <0.7 10/04/2021   LDLCALC 65 06/25/2021   CREATININE 1.38 10/04/2021  Overall stable. He is maintained on farxiga, novology, farxiga.

## 2022-01-06 NOTE — Assessment & Plan Note (Signed)
Seems to have improved word finding today.  Continues coumadin 2.5-3.5 inr goal at coumadin clinic.

## 2022-01-06 NOTE — Assessment & Plan Note (Signed)
Lab Results  Component Value Date   CHOL 143 06/25/2021   HDL 30 (L) 06/25/2021   LDLCALC 65 06/25/2021   LDLDIRECT 86.0 09/09/2020   TRIG 240 (H) 06/25/2021   CHOLHDL 4.8 06/25/2021   Repeat trigs today.

## 2022-01-06 NOTE — Progress Notes (Signed)
  Care Coordination   Note   01/06/2022 Name: ZEDRIC DEROY MRN: [id number] DOB: 1946-01-06  GURJOT BRISCO is a 76 y.o. year old male who sees [name], NP for primary care. I reached out to Daleen Squibb by phone today to offer care coordination services.  Mr. Yellin was given information about Care Coordination services today including:   The Care Coordination services include support from the care team which includes your Nurse Coordinator, Clinical Social Worker, or Pharmacist.  The Care Coordination team is here to help remove barriers to the health concerns and goals most important to you. Care Coordination services are voluntary, and the patient may decline or stop services at any time by request to their care team member.   Care Coordination Consent Status: Patient agreed to services and verbal consent obtained.   Follow up plan:  Telephone appointment with care coordination team member scheduled for:  02/22/2021  Encounter Outcome:  Pt. Scheduled  Julian Hy, [place] Direct Dial: [phone]

## 2022-01-06 NOTE — Progress Notes (Signed)
Subjective:   By signing my name below, I, [name], attest that this documentation has been prepared under the direction and in the presence of David Chimera, NP 01/06/2022    Patient ID: [name], male    DOB: 1945/12/03, 76 y.o.   MRN: [id number]  Chief Complaint  Patient presents with   Follow-up    HPI Patient is in today for an office visit. He is accompanied by his wife.   Diabetes/ Heart Arrhythmia: He discovered an article that connects diabetes to heart arrhythmias. He is currently taking 10 mg of Farxiga.  Blood Sugars: He states when he is not consuming white bread, his blood sugar is usually below 200 mg/dL. He is currently taking 70 units of Novolog and 22 units of Basaglar.  He is continuing to follow up with [name]. He adjusts the time of taking the medication PRN. Lab Results  Component Value Date   HGBA1C 7.7 (A) 10/28/2021   Cholesterol: His cholesterol levels are normal. He is currently taking 145 mg of Tricor and 40 mg of Crestor  Lab Results  Component Value Date   CHOL 143 06/25/2021   HDL 30 (L) 06/25/2021   LDLCALC 65 06/25/2021   LDLDIRECT 86.0 09/09/2020   TRIG 240 (H) 06/25/2021   CHOLHDL 4.8 06/25/2021   CVA: He reports of a CVA on 06/24/2021 and a non-ST elevated myocardial infarction on 10/08/2021. His CT on 11/23/2021 showed a 4.3 cm ascending aortic aneurysm. His speech is improving. At times, he cannot think of a word. He also struggles with memory. His wife states that he is improving well with speech and reading. When he was walking the other day and it became windy, he had some chest pain. He denies of any unusual SOB at this time.  Blood Pressure: As of today's visit, his blood pressure is normal. He is currently taking 25 mg of Metoprolol Succinate and 5 mg of Amlodipine. He takes 20 mg of Furosemide daily.  BP Readings from Last 3 Encounters:  01/06/22 124/62  11/18/21 116/62  10/28/21 120/70   Gout: He  reports that his symptoms are controlled.   COVID: He reports that he had Covid last month in November. His GFR was around 51%.   Allergies: He takes OTC Mucinex PRN.   Iron: He reports that he is seen by [name] about once a year. However, in the last year he has not been seen by him.   PSA: He is regularly following up with his urologist for his PSA.   Weight: His weight is slightly increasing. Wt Readings from Last 3 Encounters:  01/06/22 219 lb (99.3 kg)  11/18/21 218 lb (98.9 kg)  10/28/21 213 lb (96.6 kg)   Sleep Study: He underwent a sleep study around the Fall of 2023. He was advised to use the full Cpap mask but is instead using the alternative.   Smoking: He is no longer smoking.   Hernia: He reports of a recent hernia on his right groin area. The area is not causing him pain.    Immunizations: He is not UTD on the Shingles or Influenza vaccine.   Health Maintenance Due  Topic Date Due   Zoster Vaccines- Shingrix (1 of 2) Never done   Medicare Annual Wellness (AWV)  01/18/2017   COVID-19 Vaccine (5 - 2023-24 season) 09/30/2021    Past Medical History:  Diagnosis Date   Actinic keratosis 02/14/2011   Allergy    Aneurysm of ascending  aorta without rupture ([place]) 01/06/2022   Aortic stenosis    Arthritis    Bilateral sensorineural hearing loss 06/16/2009   CAD (coronary artery disease)    cabg   Complication of anesthesia    Coronary atherosclerosis of native coronary artery 06/15/2009   Diabetic polyneuropathy 11/11/2018   Dyslipidemia 86/76/7209   Embolic stroke involving left middle cerebral artery  05/30/2021   Erectile dysfunction 05/18/2009   Essential hypertension 06/16/2009   Expressive aphasia 06/25/2021   External hemorrhoids 03/21/2010   Gout 04/09/2015   Heart murmur    Hemochromatosis 02/14/2011   History of aortic valve replacement 10/13/2009   History of CABG 11/05/2018   2005- 1. Coronary artery bypass grafting x4 with LIMA-LAD, SVG-OM1,  SVG-AM and dCFX.  2. Aortic valve replacement with [facility] AVR 3. Septal myomectomy.  4. Myoview low risk 2016   History of hepatitis 01/31/1972   Hypertriglyceridemia 06/13/2010   Hypertrophic obstructive cardiomyopathy 05/06/2008   SP septal myomectomy at surgery 2005 Echo Nov 2018- Hyperdynamic LVEF with severe basal septal hypertrophy. There is chordal SAM with resting gradient of 16 mmHg that increases to 85 mmHg with Valsalva        Leg weakness, bilateral 01/18/2021   Long term (current) use of anticoagulants 05/22/2018   Lumbar stenosis with neurogenic claudication 02/04/2021   Malignant neoplasm of prostate 06/17/2009   Mechanical heart valve present    Manufacturer: Sherlean Foot #: [id number]  Model #: [id number]. Card states MRI compatible with 3 teslas or less.   Obesity, unspecified 04/29/2008   OSA (obstructive sleep apnea) 07/12/2012   moderate-severe; uses CPAP nightly   PONV (postoperative nausea and vomiting)    after CABG- slow to wake up   Presence of prosthetic heart valve    Primary osteoarthritis of left shoulder 10/31/2018   S/P lumbar laminectomy 02/04/2021   Stage 3 chronic kidney disease due to type 2 diabetes mellitus 11/11/2018   Tobacco use 06/16/2009   Type 2 diabetes mellitus with hyperglycemia    Ventral hernia 03/03/2014    Past Surgical History:  Procedure Laterality Date   AORTIC VALVE REPLACEMENT  11/14/2003   [facility]   APPENDECTOMY  [place]   CORONARY ANGIOGRAPHY  10/11/2021   CORONARY ARTERY BYPASS GRAFT  10/2003   [facility]   right, inguinal   HERNIA REPAIR  2002   left, inguinal   HIP SURGERY  2006   right hip   LUMBAR LAMINECTOMY/DECOMPRESSION MICRODISCECTOMY Bilateral 02/04/2021   Procedure: Bilateral Lumbar Two-Three Laminectomy;  Surgeon: Kristeen Miss, MD;  Location: [facility];  Service: Neurosurgery;  Laterality: Bilateral;  3C/RM 50   PILONIDAL CYST EXCISION  1964   prostate seed implant   03/2010   TONSILLECTOMY  childhood    Family History  Problem Relation Age of Onset   Heart disease Mother    Stroke Mother    Heart disease Father    Asperger's syndrome Son    Hyperlipidemia Son    Coronary artery disease Brother    Cancer Neg Hx        negative for colon cancer    Social History   Socioeconomic History   Marital status: Married    Spouse name: Not on file   Number of children: Not on file   Years of education: 18   Highest education level: Master's degree (e.g., MA, MS, MEng, MEd, MSW, MBA)  Occupational History   Occupation: Retired  Tobacco Use   Smoking status: Former    Types: Cigars    Quit date: 06/2021    Years since quitting: 0.5   Smokeless tobacco: Never   Tobacco comments:    Pt states he smokes 1 cigar about once a month. 06/01/2021    Pt quit  Cigars 06/2021  Substance and Sexual Activity   Alcohol use: Not Currently    Comment: wine maybe 1 per month   Drug use: No   Sexual activity: Never  Other Topics Concern   Not on file  Social History Narrative   Right Handed    Lives in a one sotyr home. One step leads to front door.    Social Determinants of Health   Financial Resource Strain: Not on file  Food Insecurity: Not on file  Transportation Needs: Not on file  Physical Activity: Not on file  Stress: Not on file  Social Connections: Not on file  Intimate Partner Violence: Not on file    Outpatient Medications Prior to Visit  Medication Sig Dispense Refill   AMBULATORY NON FORMULARY MEDICATION cpap cushions            AirFit F20 (Size: Large)           AirFit F30 (Size: Med)   Please FAX the prescription to:  [phone] each prn   amLODipine (NORVASC) 5 MG tablet TAKE 1 TABLET (5 MG TOTAL) BY MOUTH DAILY. 90 tablet 1   betamethasone dipropionate (DIPROLENE) 0.05 % cream Apply topically 2 (two) times daily as needed. To eczema rash 30 g 1   Blood Glucose Monitoring Suppl Supplies MISC Use for monitoring glucose  level 100 each 1   clopidogrel (PLAVIX) 75 MG tablet TAKE 1 TABLET BY MOUTH EVERY DAY 90 tablet 1   colchicine 0.6 MG tablet TAKE 1 TABLET (0.6 MG TOTAL) BY MOUTH 2 (TWO) TIMES DAILY AS NEEDED. 180 tablet 0   Continuous Blood Gluc Sensor (DEXCOM G6 SENSOR) MISC 1 Device by Does not apply route as directed. 9 each 3   Continuous Blood Gluc Transmit (DEXCOM G6 TRANSMITTER) MISC 1 Device by Does not apply route as directed. 1 each 3   dapagliflozin propanediol (FARXIGA) 10 MG TABS tablet Take 1 tablet (10 mg total) by mouth daily before breakfast. 90 tablet 3   fenofibrate (TRICOR) 145 MG tablet TAKE 1 TABLET BY MOUTH EVERY DAY 90 tablet 1   furosemide (LASIX) 20 MG tablet TAKE 1 TABLET BY MOUTH EVERY DAY AS NEEDED FOR SWELLING 90 tablet 1   glucose blood (ONETOUCH VERIO) test strip Use as instructed 100 strip 12   insulin aspart (NOVOLOG FLEXPEN) 100 UNIT/ML FlexPen Max daily 70 units 75 mL 3   Insulin Glargine (BASAGLAR KWIKPEN) 100 UNIT/ML Inject 22 Units into the skin daily. 30 mL 3   Insulin Pen Needle 32G X 4 MM MISC 1 Device by Does not apply route in the morning, at noon, in the evening, and at bedtime. 400 each 3   isosorbide mononitrate (IMDUR) 30 MG 24 hr tablet TAKE 1 TABLET BY MOUTH EVERY DAY 90 tablet 1   metoprolol succinate (TOPROL-XL) 25 MG 24 hr tablet TAKE 1 TABLET BY MOUTH EVERY DAY 90 tablet 1   OneTouch Delica Lancets 78M MISC USE AS DIRECTED 100 each 1   rosuvastatin (CRESTOR) 40 MG tablet TAKE 1 TABLET BY MOUTH EVERY DAY 90 tablet 3   tacrolimus (PROTOPIC) 0.1 % ointment Apply 1 application  topically daily as needed (irritation).  warfarin (COUMADIN) 7.5 MG tablet TAKE 1/2 A TABLET TO 1 TABLET BY MOUTH DAILY AS DIRECTED BY THE COUMADIN CLINIC 105 tablet 1   No facility-administered medications prior to visit.    No Known Allergies  Review of Systems  Respiratory:  Negative for shortness of breath.   Gastrointestinal:        (+) Hernia (Right Groin) (non painful)        Objective:    Physical Exam Constitutional:      General: He is not in acute distress.    Appearance: Normal appearance. He is not ill-appearing.  HENT:     Head: Normocephalic and atraumatic.     Right Ear: External ear normal.     Left Ear: External ear normal.  Eyes:     Extraocular Movements: Extraocular movements intact.     Pupils: Pupils are equal, round, and reactive to light.  Cardiovascular:     Rate and Rhythm: Normal rate and regular rhythm.     Heart sounds: Normal heart sounds. No murmur heard.    No gallop.     Comments: Mechanical valve click  Pulmonary:     Effort: Pulmonary effort is normal. No respiratory distress.     Breath sounds: Normal breath sounds. No wheezing or rales.  Musculoskeletal:     Right lower leg: 2+ Edema present.     Left lower leg: 2+ Edema present.  Skin:    General: Skin is warm and dry.  Neurological:     Mental Status: He is alert and oriented to person, place, and time.  Psychiatric:        Mood and Affect: Mood normal.        Behavior: Behavior normal.        Judgment: Judgment normal.     BP 124/62   Pulse 61   Temp 97.8 F (36.6 C) (Oral)   Resp 18   Ht _0  (1.778 m)   Wt 219 lb (99.3 kg)   SpO2 97%   BMI 31.42 kg/m  Wt Readings from Last 3 Encounters:  01/06/22 219 lb (99.3 kg)  11/18/21 218 lb (98.9 kg)  10/28/21 213 lb (96.6 kg)       Assessment & Plan:   Problem List Items Addressed This Visit       Unprioritized   History of aortic valve replacement (Chronic)    Continues coumadin monitoring at [facility]madin clinic.       Hemochromatosis (Chronic)    Lab Results  Component Value Date   WBC 7.7 06/26/2021   HGB 15.0 06/26/2021   HCT 43.7 06/26/2021   MCV 88.6 06/26/2021   PLT 227 06/26/2021  Last blood count normal. Check ferritin.      Relevant Orders   Ferritin   Essential hypertension (Chronic)    BP Readings from Last 3 Encounters:  01/06/22 124/62  11/18/21 116/62  10/28/21  120/70  BP at goal.        Relevant Orders   Comp Met (CMET)   Dyslipidemia (Chronic)    Lab Results  Component Value Date   CHOL 143 06/25/2021   HDL 30 (L) 06/25/2021   LDLCALC 65 06/25/2021   LDLDIRECT 86.0 09/09/2020   TRIG 240 (H) 06/25/2021   CHOLHDL 4.8 06/25/2021  LDL at goal on crestor and tricor. Continue same.       RESOLVED: Tobacco use    Pt has quit completely.       OSA (obstructive sleep apnea)  Had recent sleep study. Did not tolerate full face mask, he is using nasal mask only. Management per sleep specialist.       Obesity, unspecified    Wt Readings from Last 3 Encounters:  01/06/22 219 lb (99.3 kg)  11/18/21 218 lb (98.9 kg)  10/28/21 213 lb (96.6 kg)  Weight is up a bit.  Discussed diet/exercise.       Malignant neoplasm of prostate [facility])    Follows with urology for PSA surveillance.      Hypertrophic obstructive cardiomyopathy    Clinically euvolemic. He uses daily furosemide 3m.       Hypertriglyceridemia    Lab Results  Component Value Date   CHOL 143 06/25/2021   HDL 30 (L) 06/25/2021   LDLCALC 65 06/25/2021   LDLDIRECT 86.0 09/09/2020   TRIG 240 (H) 06/25/2021   CHOLHDL 4.8 06/25/2021  Repeat trigs today.       Relevant Orders   Lipid panel   Gout    Stable, has allopurinol on hand for prn use.       Embolic stroke involving left middle cerebral artery     Seems to have improved word finding today.  Continues coumadin 2.5-3.5 inr goal at coumadin clinic.       Diabetes mellitus ([place] - [facility]    Lab Results  Component Value Date   HGBA1C 7.7 (A) 10/28/2021   HGBA1C 8.3 (H) 06/25/2021   HGBA1C 8.0 (A) 04/26/2021   Lab Results  Component Value Date   MICROALBUR <0.7 10/04/2021   LDLCALC 65 06/25/2021   CREATININE 1.38 10/04/2021  Overall stable. He is maintained on farxiga, novology, farxiga.        Relevant Orders   Comp Met (CMET)   Bilateral sensorineural hearing loss    Unchanged.       Aneurysm of  ascending aorta without rupture (HCC)    Noted on CT 4.3 cm.  Will be due for follow up CT 10/24.      No orders of the defined types were placed in this encounter.   I, MNance Pear NP, personally preformed the services described in this documentation.  All medical record entries made by the scribe were at my direction and in my presence.  I have reviewed the chart and discharge instructions (if applicable) and agree that the record reflects my personal performance and is accurate and complete. 01/06/2022   I,Amber Collins,acting as a scribe for MNance Pear NP.,have documented all relevant documentation on the behalf of MNance Pear NP,as directed by  MNance Pear NP while in the presence of MNance Pear NP.    MNance Pear NP

## 2022-01-06 NOTE — Assessment & Plan Note (Signed)
Clinically euvolemic. He uses daily furosemide '20mg'$ .

## 2022-01-06 NOTE — Assessment & Plan Note (Signed)
Lab Results  Component Value Date   CHOL 143 06/25/2021   HDL 30 (L) 06/25/2021   LDLCALC 65 06/25/2021   LDLDIRECT 86.0 09/09/2020   TRIG 240 (H) 06/25/2021   CHOLHDL 4.8 06/25/2021   LDL at goal on crestor and tricor. Continue same.

## 2022-01-06 NOTE — Assessment & Plan Note (Signed)
BP Readings from Last 3 Encounters:  01/06/22 124/62  11/18/21 116/62  10/28/21 120/70   BP at goal.

## 2022-01-06 NOTE — Assessment & Plan Note (Signed)
Pt has quit completely.

## 2022-01-06 NOTE — Assessment & Plan Note (Signed)
Stable, has allopurinol on hand for prn use.

## 2022-01-06 NOTE — Assessment & Plan Note (Signed)
Follows with urology for PSA surveillance.

## 2022-01-06 NOTE — Assessment & Plan Note (Signed)
Had recent sleep study. Did not tolerate full face mask, he is using nasal mask only. Management per sleep specialist.

## 2022-01-18 DIAGNOSIS — E119 Type 2 diabetes mellitus without complications: Secondary | ICD-10-CM | POA: Diagnosis not present

## 2022-01-20 DIAGNOSIS — I10 Essential (primary) hypertension: Secondary | ICD-10-CM | POA: Diagnosis not present

## 2022-01-20 DIAGNOSIS — R0681 Apnea, not elsewhere classified: Secondary | ICD-10-CM | POA: Diagnosis not present

## 2022-01-20 DIAGNOSIS — R0683 Snoring: Secondary | ICD-10-CM | POA: Diagnosis not present

## 2022-01-20 DIAGNOSIS — G4733 Obstructive sleep apnea (adult) (pediatric): Secondary | ICD-10-CM | POA: Diagnosis not present

## 2022-01-24 ENCOUNTER — Encounter (HOSPITAL_COMMUNITY)
Admission: EM | Disposition: A | Discharge status: Home / Self Care | Payer: Self-pay | Source: Home / Self Care | Attending: Neurology

## 2022-01-24 ENCOUNTER — Inpatient Hospital Stay (HOSPITAL_COMMUNITY): Payer: Medicare HMO

## 2022-01-24 ENCOUNTER — Emergency Department (HOSPITAL_COMMUNITY): Payer: Medicare HMO | Admitting: Anesthesiology

## 2022-01-24 ENCOUNTER — Encounter (HOSPITAL_COMMUNITY): Payer: Self-pay | Admitting: Student in an Organized Health Care Education/Training Program

## 2022-01-24 ENCOUNTER — Emergency Department (HOSPITAL_COMMUNITY): Payer: Medicare HMO

## 2022-01-24 ENCOUNTER — Inpatient Hospital Stay (HOSPITAL_COMMUNITY)
Admission: EM | Admit: 2022-01-24 | Discharge: 2022-01-27 | DRG: 024 | Disposition: A | Discharge status: Home / Self Care | Payer: Medicare HMO | Attending: Neurology | Admitting: Neurology

## 2022-01-24 DIAGNOSIS — I639 Cerebral infarction, unspecified: Principal | ICD-10-CM | POA: Diagnosis present

## 2022-01-24 DIAGNOSIS — I6602 Occlusion and stenosis of left middle cerebral artery: Secondary | ICD-10-CM

## 2022-01-24 DIAGNOSIS — J9601 Acute respiratory failure with hypoxia: Secondary | ICD-10-CM

## 2022-01-24 DIAGNOSIS — R339 Retention of urine, unspecified: Secondary | ICD-10-CM | POA: Insufficient documentation

## 2022-01-24 DIAGNOSIS — I6502 Occlusion and stenosis of left vertebral artery: Secondary | ICD-10-CM | POA: Diagnosis not present

## 2022-01-24 DIAGNOSIS — I2510 Atherosclerotic heart disease of native coronary artery without angina pectoris: Secondary | ICD-10-CM | POA: Diagnosis present

## 2022-01-24 DIAGNOSIS — R29720 NIHSS score 20: Secondary | ICD-10-CM | POA: Diagnosis present

## 2022-01-24 DIAGNOSIS — E785 Hyperlipidemia, unspecified: Secondary | ICD-10-CM | POA: Diagnosis not present

## 2022-01-24 DIAGNOSIS — R338 Other retention of urine: Secondary | ICD-10-CM | POA: Diagnosis not present

## 2022-01-24 DIAGNOSIS — E1165 Type 2 diabetes mellitus with hyperglycemia: Secondary | ICD-10-CM | POA: Diagnosis not present

## 2022-01-24 DIAGNOSIS — Z79899 Other long term (current) drug therapy: Secondary | ICD-10-CM

## 2022-01-24 DIAGNOSIS — I6523 Occlusion and stenosis of bilateral carotid arteries: Secondary | ICD-10-CM | POA: Diagnosis not present

## 2022-01-24 DIAGNOSIS — R791 Abnormal coagulation profile: Secondary | ICD-10-CM | POA: Diagnosis present

## 2022-01-24 DIAGNOSIS — I129 Hypertensive chronic kidney disease with stage 1 through stage 4 chronic kidney disease, or unspecified chronic kidney disease: Secondary | ICD-10-CM | POA: Diagnosis present

## 2022-01-24 DIAGNOSIS — E1122 Type 2 diabetes mellitus with diabetic chronic kidney disease: Secondary | ICD-10-CM | POA: Diagnosis not present

## 2022-01-24 DIAGNOSIS — Z794 Long term (current) use of insulin: Secondary | ICD-10-CM

## 2022-01-24 DIAGNOSIS — Z951 Presence of aortocoronary bypass graft: Secondary | ICD-10-CM | POA: Diagnosis not present

## 2022-01-24 DIAGNOSIS — G8191 Hemiplegia, unspecified affecting right dominant side: Secondary | ICD-10-CM | POA: Diagnosis present

## 2022-01-24 DIAGNOSIS — Z1152 Encounter for screening for COVID-19: Secondary | ICD-10-CM

## 2022-01-24 DIAGNOSIS — Z7901 Long term (current) use of anticoagulants: Secondary | ICD-10-CM | POA: Diagnosis not present

## 2022-01-24 DIAGNOSIS — I63412 Cerebral infarction due to embolism of left middle cerebral artery: Principal | ICD-10-CM | POA: Diagnosis present

## 2022-01-24 DIAGNOSIS — E119 Type 2 diabetes mellitus without complications: Secondary | ICD-10-CM | POA: Diagnosis not present

## 2022-01-24 DIAGNOSIS — R29810 Facial weakness: Secondary | ICD-10-CM | POA: Diagnosis present

## 2022-01-24 DIAGNOSIS — Z952 Presence of prosthetic heart valve: Secondary | ICD-10-CM

## 2022-01-24 DIAGNOSIS — R4701 Aphasia: Secondary | ICD-10-CM | POA: Diagnosis not present

## 2022-01-24 DIAGNOSIS — I700 Atherosclerosis of aorta: Secondary | ICD-10-CM | POA: Diagnosis present

## 2022-01-24 DIAGNOSIS — E669 Obesity, unspecified: Secondary | ICD-10-CM | POA: Diagnosis present

## 2022-01-24 DIAGNOSIS — Z6833 Body mass index (BMI) 33.0-33.9, adult: Secondary | ICD-10-CM

## 2022-01-24 DIAGNOSIS — R531 Weakness: Secondary | ICD-10-CM | POA: Diagnosis not present

## 2022-01-24 DIAGNOSIS — I959 Hypotension, unspecified: Secondary | ICD-10-CM | POA: Diagnosis not present

## 2022-01-24 DIAGNOSIS — I63512 Cerebral infarction due to unspecified occlusion or stenosis of left middle cerebral artery: Secondary | ICD-10-CM | POA: Diagnosis not present

## 2022-01-24 DIAGNOSIS — G4733 Obstructive sleep apnea (adult) (pediatric): Secondary | ICD-10-CM | POA: Diagnosis not present

## 2022-01-24 DIAGNOSIS — R4702 Dysphasia: Secondary | ICD-10-CM | POA: Diagnosis not present

## 2022-01-24 DIAGNOSIS — Z9889 Other specified postprocedural states: Secondary | ICD-10-CM | POA: Diagnosis not present

## 2022-01-24 DIAGNOSIS — Z7902 Long term (current) use of antithrombotics/antiplatelets: Secondary | ICD-10-CM | POA: Diagnosis not present

## 2022-01-24 DIAGNOSIS — Z743 Need for continuous supervision: Secondary | ICD-10-CM | POA: Diagnosis not present

## 2022-01-24 DIAGNOSIS — N189 Chronic kidney disease, unspecified: Secondary | ICD-10-CM | POA: Diagnosis not present

## 2022-01-24 HISTORY — PX: RADIOLOGY WITH ANESTHESIA: SHX6223

## 2022-01-24 HISTORY — DX: Occlusion and stenosis of left middle cerebral artery: I66.02

## 2022-01-24 HISTORY — PX: IR PERCUTANEOUS ART THROMBECTOMY/INFUSION INTRACRANIAL INC DIAG ANGIO: IMG6087

## 2022-01-24 LAB — GLUCOSE, CAPILLARY: Glucose-Capillary: 298 mg/dL — ABNORMAL HIGH (ref 70–99)

## 2022-01-24 LAB — DIFFERENTIAL
Abs Immature Granulocytes: 0.02 10*3/uL (ref 0.00–0.07)
Basophils Absolute: 0.1 10*3/uL (ref 0.0–0.1)
Basophils Relative: 1 %
Eosinophils Absolute: 0.2 10*3/uL (ref 0.0–0.5)
Eosinophils Relative: 3 %
Immature Granulocytes: 0 %
Lymphocytes Relative: 18 %
Lymphs Abs: 1.4 10*3/uL (ref 0.7–4.0)
Monocytes Absolute: 0.7 10*3/uL (ref 0.1–1.0)
Monocytes Relative: 9 %
Neutro Abs: 5.4 10*3/uL (ref 1.7–7.7)
Neutrophils Relative %: 69 %

## 2022-01-24 LAB — RESPIRATORY PANEL BY PCR

## 2022-01-24 LAB — CBC
HCT: 42.4 % (ref 39.0–52.0)
Hemoglobin: 14 g/dL (ref 13.0–17.0)
MCH: 29.7 pg (ref 26.0–34.0)
MCHC: 33 g/dL (ref 30.0–36.0)
MCV: 89.8 fL (ref 80.0–100.0)
Platelets: 205 10*3/uL (ref 150–400)
RBC: 4.72 MIL/uL (ref 4.22–5.81)
RDW: 13.7 % (ref 11.5–15.5)
WBC: 7.7 10*3/uL (ref 4.0–10.5)
nRBC: 0 % (ref 0.0–0.2)

## 2022-01-24 LAB — MRSA NEXT GEN BY PCR, NASAL: MRSA by PCR Next Gen: NOT DETECTED

## 2022-01-24 LAB — ECHOCARDIOGRAM COMPLETE
AR max vel: 2.71 cm2
AV Area VTI: 2.78 cm2
AV Area mean vel: 2.47 cm2
AV Mean grad: 8.7 mmHg
AV Peak grad: 13.5 mmHg
Ao pk vel: 1.84 m/s
Area-P 1/2: 2.2 cm2
Calc EF: 66.9 %
S' Lateral: 2.9 cm
Single Plane A2C EF: 65.7 %
Single Plane A4C EF: 67.4 %
Weight: 3590.85 oz

## 2022-01-24 LAB — I-STAT CHEM 8, ED
BUN: 31 mg/dL — ABNORMAL HIGH (ref 8–23)
Calcium, Ion: 1.1 mmol/L — ABNORMAL LOW (ref 1.15–1.40)
Chloride: 106 mmol/L (ref 98–111)
Creatinine, Ser: 1.2 mg/dL (ref 0.61–1.24)
Glucose, Bld: 168 mg/dL — ABNORMAL HIGH (ref 70–99)
HCT: 42 % (ref 39.0–52.0)
Hemoglobin: 14.3 g/dL (ref 13.0–17.0)
Potassium: 3.6 mmol/L (ref 3.5–5.1)
Sodium: 140 mmol/L (ref 135–145)
TCO2: 21 mmol/L — ABNORMAL LOW (ref 22–32)

## 2022-01-24 LAB — COMPREHENSIVE METABOLIC PANEL
ALT: 21 U/L (ref 0–44)
AST: 28 U/L (ref 15–41)
Albumin: 3.9 g/dL (ref 3.5–5.0)
Alkaline Phosphatase: 42 U/L (ref 38–126)
Anion gap: 8 (ref 5–15)
BUN: 32 mg/dL — ABNORMAL HIGH (ref 8–23)
CO2: 24 mmol/L (ref 22–32)
Calcium: 9 mg/dL (ref 8.9–10.3)
Chloride: 105 mmol/L (ref 98–111)
Creatinine, Ser: 1.35 mg/dL — ABNORMAL HIGH (ref 0.61–1.24)
GFR, Estimated: 54 mL/min — ABNORMAL LOW (ref >60)
Glucose, Bld: 169 mg/dL — ABNORMAL HIGH (ref 70–99)
Potassium: 3.5 mmol/L (ref 3.5–5.1)
Sodium: 137 mmol/L (ref 135–145)
Total Bilirubin: 0.4 mg/dL (ref 0.3–1.2)
Total Protein: 7.1 g/dL (ref 6.5–8.1)

## 2022-01-24 LAB — PROTIME-INR
INR: 2.2 — ABNORMAL HIGH (ref 0.8–1.2)
Prothrombin Time: 24.1 seconds — ABNORMAL HIGH (ref 11.4–15.2)

## 2022-01-24 LAB — RESP PANEL BY RT-PCR (RSV, FLU A&B, COVID)  RVPGX2
Influenza A by PCR: NEGATIVE
Influenza B by PCR: NEGATIVE
Resp Syncytial Virus by PCR: NEGATIVE
SARS Coronavirus 2 by RT PCR: NEGATIVE

## 2022-01-24 LAB — CBG MONITORING, ED
Glucose-Capillary: 159 mg/dL — ABNORMAL HIGH (ref 70–99)
Glucose-Capillary: 173 mg/dL — ABNORMAL HIGH (ref 70–99)

## 2022-01-24 LAB — APTT: aPTT: 30 seconds (ref 24–36)

## 2022-01-24 LAB — ETHANOL: Alcohol, Ethyl (B): <10 mg/dL (ref <10)

## 2022-01-24 LAB — HEPARIN LEVEL (UNFRACTIONATED): Heparin Unfractionated: <0.1 IU/mL — ABNORMAL LOW (ref 0.30–0.70)

## 2022-01-24 SURGERY — IR WITH ANESTHESIA
Anesthesia: General

## 2022-01-24 MED ORDER — ACETAMINOPHEN 650 MG RE SUPP: 650.0000 mg | RECTAL | Status: DC | PRN

## 2022-01-24 MED ORDER — SUCCINYLCHOLINE CHLORIDE 200 MG/10ML IV SOSY
PREFILLED_SYRINGE | INTRAVENOUS | Status: DC | PRN
  Administered 2022-01-24: 120 mg via INTRAVENOUS

## 2022-01-24 MED ORDER — INSULIN ASPART 100 UNIT/ML IJ SOLN: 0.0000 [IU] | INTRAMUSCULAR | Status: DC

## 2022-01-24 MED ORDER — ROCURONIUM BROMIDE 10 MG/ML (PF) SYRINGE
PREFILLED_SYRINGE | INTRAVENOUS | Status: DC | PRN
  Administered 2022-01-24: 50 mg via INTRAVENOUS

## 2022-01-24 MED ORDER — HEPARIN (PORCINE) 25000 UT/250ML-% IV SOLN
1400.0000 [IU]/h | INTRAVENOUS | Status: DC
  Administered 2022-01-24: 1150 [IU]/h via INTRAVENOUS
  Administered 2022-01-25: 1400 [IU]/h via INTRAVENOUS
  Filled 2022-01-24 (×2): qty 250

## 2022-01-24 MED ORDER — PROPOFOL 10 MG/ML IV BOLUS
INTRAVENOUS | Status: DC | PRN
  Administered 2022-01-24: 100 mg via INTRAVENOUS

## 2022-01-24 MED ORDER — PHENYLEPHRINE HCL-NACL 20-0.9 MG/250ML-% IV SOLN
INTRAVENOUS | Status: DC | PRN
  Administered 2022-01-24: 25 ug/min via INTRAVENOUS

## 2022-01-24 MED ORDER — PANTOPRAZOLE SODIUM 40 MG IV SOLR: 40.0000 mg | Freq: Every day | INTRAVENOUS | Status: DC

## 2022-01-24 MED ORDER — CLEVIDIPINE BUTYRATE 0.5 MG/ML IV EMUL
0.0000 mg/h | INTRAVENOUS | Status: DC
  Administered 2022-01-24: 8 mg/h via INTRAVENOUS
  Administered 2022-01-24: 2 mg/h via INTRAVENOUS
  Administered 2022-01-24: 3 mg/h via INTRAVENOUS
  Administered 2022-01-25: 2 mg/h via INTRAVENOUS
  Filled 2022-01-24 (×4): qty 50

## 2022-01-24 MED ORDER — LIDOCAINE 2% (20 MG/ML) 5 ML SYRINGE
INTRAMUSCULAR | Status: DC | PRN
  Administered 2022-01-24: 60 mg via INTRAVENOUS

## 2022-01-24 MED ORDER — PERFLUTREN LIPID MICROSPHERE
1.0000 mL | INTRAVENOUS | Status: AC | PRN
  Administered 2022-01-24: 5 mL via INTRAVENOUS

## 2022-01-24 MED ORDER — CEFAZOLIN SODIUM-DEXTROSE 2-4 GM/100ML-% IV SOLN
INTRAVENOUS | Status: AC
  Filled 2022-01-24: qty 100

## 2022-01-24 MED ORDER — ACETAMINOPHEN 160 MG/5ML PO SOLN: 650.0000 mg | ORAL | Status: DC | PRN

## 2022-01-24 MED ORDER — SODIUM CHLORIDE 0.9 % IV SOLN: INTRAVENOUS | Status: DC

## 2022-01-24 MED ORDER — IOHEXOL 350 MG/ML SOLN
75.0000 mL | Freq: Once | INTRAVENOUS | Status: AC | PRN
  Administered 2022-01-24: 75 mL via INTRAVENOUS

## 2022-01-24 MED ORDER — SODIUM CHLORIDE (PF) 0.9 % IJ SOLN
INTRAVENOUS | Status: AC | PRN
  Administered 2022-01-24: 25 ug via INTRA_ARTERIAL

## 2022-01-24 MED ORDER — NITROGLYCERIN 1 MG/10 ML FOR IR/CATH LAB
INTRA_ARTERIAL | Status: AC
  Filled 2022-01-24: qty 10

## 2022-01-24 MED ORDER — IOHEXOL 300 MG/ML  SOLN
100.0000 mL | Freq: Once | INTRAMUSCULAR | Status: AC | PRN
  Administered 2022-01-24: 75 mL via INTRA_ARTERIAL

## 2022-01-24 MED ORDER — SODIUM CHLORIDE 0.9 % IV SOLN: INTRAVENOUS | Status: DC | PRN

## 2022-01-24 MED ORDER — ACETAMINOPHEN 325 MG PO TABS
650.0000 mg | ORAL_TABLET | ORAL | Status: DC | PRN
  Administered 2022-01-25 – 2022-01-27 (×2): 650 mg via ORAL
  Filled 2022-01-24 (×2): qty 2

## 2022-01-24 MED ORDER — PROPOFOL 500 MG/50ML IV EMUL
INTRAVENOUS | Status: DC | PRN
  Administered 2022-01-24: 50 ug/kg/min via INTRAVENOUS

## 2022-01-24 MED ORDER — CEFAZOLIN SODIUM-DEXTROSE 2-3 GM-%(50ML) IV SOLR
INTRAVENOUS | Status: DC | PRN
  Administered 2022-01-24: 2 g via INTRAVENOUS

## 2022-01-24 MED ORDER — ONDANSETRON HCL 4 MG/2ML IJ SOLN
INTRAMUSCULAR | Status: DC | PRN
  Administered 2022-01-24: 4 mg via INTRAVENOUS

## 2022-01-24 MED ORDER — CHLORHEXIDINE GLUCONATE CLOTH 2 % EX PADS
6.0000 | MEDICATED_PAD | Freq: Every day | CUTANEOUS | Status: DC
  Administered 2022-01-26 – 2022-01-27 (×2): 6 via TOPICAL

## 2022-01-24 MED ORDER — FENTANYL CITRATE (PF) 250 MCG/5ML IJ SOLN
INTRAMUSCULAR | Status: DC | PRN
  Administered 2022-01-24: 100 ug via INTRAVENOUS

## 2022-01-24 MED ORDER — ACETAMINOPHEN 325 MG PO TABS: 650.0000 mg | ORAL_TABLET | ORAL | Status: DC | PRN

## 2022-01-24 MED ORDER — ORAL CARE MOUTH RINSE: 15.0000 mL | OROMUCOSAL | Status: DC | PRN

## 2022-01-24 MED ORDER — EPHEDRINE SULFATE-NACL 50-0.9 MG/10ML-% IV SOSY
PREFILLED_SYRINGE | INTRAVENOUS | Status: DC | PRN
  Administered 2022-01-24: 10 mg via INTRAVENOUS
  Administered 2022-01-24 (×2): 5 mg via INTRAVENOUS

## 2022-01-24 MED ORDER — STROKE: EARLY STAGES OF RECOVERY BOOK
Freq: Once | Status: AC
  Filled 2022-01-24: qty 1

## 2022-01-24 MED ORDER — PROPOFOL 1000 MG/100ML IV EMUL
5.0000 ug/kg/min | INTRAVENOUS | Status: DC
  Administered 2022-01-24: 60 ug/kg/min via INTRAVENOUS
  Administered 2022-01-24: 50 ug/kg/min via INTRAVENOUS

## 2022-01-24 MED ORDER — INSULIN ASPART 100 UNIT/ML IJ SOLN
0.0000 [IU] | Freq: Three times a day (TID) | INTRAMUSCULAR | Status: DC
  Administered 2022-01-24: 8 [IU] via SUBCUTANEOUS
  Administered 2022-01-25: 5 [IU] via SUBCUTANEOUS
  Administered 2022-01-25: 3 [IU] via SUBCUTANEOUS
  Administered 2022-01-25: 2 [IU] via SUBCUTANEOUS
  Administered 2022-01-25: 5 [IU] via SUBCUTANEOUS
  Administered 2022-01-26: 8 [IU] via SUBCUTANEOUS
  Administered 2022-01-26 (×2): 3 [IU] via SUBCUTANEOUS
  Administered 2022-01-26 – 2022-01-27 (×2): 8 [IU] via SUBCUTANEOUS
  Administered 2022-01-27 (×2): 5 [IU] via SUBCUTANEOUS

## 2022-01-24 MED ORDER — SENNOSIDES-DOCUSATE SODIUM 8.6-50 MG PO TABS: 1.0000 | ORAL_TABLET | Freq: Every evening | ORAL | Status: DC | PRN

## 2022-01-24 NOTE — Progress Notes (Signed)
Referring Physician(s): Arora,Ashish  Supervising Physician: Luanne Bras  Patient Status:  [facility] - In-pt  Chief Complaint:  Status post revascularization of occluded left middle cerebral artery M1 segment with 1 pass    Subjective:  Pt lying flat in bed. He denies HA, N/V or pain. He states he wants to sit upright in bed.   Allergies: Patient has no allergy information on record.  Medications: Prior to Admission medications   Not on File     Vital Signs: BP (!) 149/59 Comment: cleviprex started  Pulse (!) 55   Temp (!) 97.2 F (36.2 C)   Resp 13   Wt 224 lb 6.9 oz (101.8 kg)   SpO2 95%   Physical Exam Vitals reviewed.  Constitutional:      General: He is not in acute distress.    Appearance: Normal appearance. He is ill-appearing.  HENT:     Mouth/Throat:     Mouth: Mucous membranes are dry.  Eyes:     Extraocular Movements: Extraocular movements intact.     Pupils: Pupils are equal, round, and reactive to light.  Cardiovascular:     Rate and Rhythm: Normal rate. Rhythm irregular.  Pulmonary:     Effort: Pulmonary effort is normal. No respiratory distress.  Musculoskeletal:     Right lower leg: Edema present.     Left lower leg: Edema present.  Skin:    General: Skin is warm and dry.     Comments: R CFA access site is soft with no active bleeding and no appreciable pseudoaneurysm. Dressing is C/D/I     Neurological:     Mental Status: He is alert and oriented to person, place, and time.     Comments: Alert, aware and oriented X 3 Speech and comprehension is intact.  PERRL bilaterally No facial droop noted Tongue midline Can spontaneously move all 4 extremities.  Negative pronator drift.  Distal pulse palpable on operative RLE.       Psychiatric:        Mood and Affect: Mood normal.        Behavior: Behavior normal.        Thought Content: Thought content normal.        Judgment: Judgment normal.     Imaging: CT ANGIO HEAD  NECK W WO CM (CODE STROKE)  Result Date: 01/24/2022 CLINICAL DATA:  Acute right-sided weakness EXAM: CT ANGIOGRAPHY HEAD AND NECK TECHNIQUE: Multidetector CT imaging of the head and neck was performed using the standard protocol during bolus administration of intravenous contrast. Multiplanar CT image reconstructions and MIPs were obtained to evaluate the vascular anatomy. Carotid stenosis measurements (when applicable) are obtained utilizing NASCET criteria, using the distal internal carotid diameter as the denominator. RADIATION DOSE REDUCTION: This exam was performed according to the departmental dose-optimization program which includes automated exposure control, adjustment of the mA and/or kV according to patient size and/or use of iterative reconstruction technique. CONTRAST:  27m OMNIPAQUE IOHEXOL 350 MG/ML SOLN COMPARISON:  Head CT immediately prior FINDINGS: CTA NECK FINDINGS Aortic arch: Aortic atherosclerosis. Branching pattern is normal without origin stenosis. Right carotid system: Common carotid artery shows minimal scattered plaque but is widely patent to the bifurcation. Calcified plaque at the carotid bifurcation and proximal ICA. Minimal diameter is 3.6 mm. Compared to a more distal cervical ICA diameter of 4.8 mm, this indicates a 25% stenosis. Widely patent beyond that to the skull base. Left carotid system: Common carotid artery shows scattered plaque but is widely patent  to the bifurcation. Calcified plaque at the bifurcation and ICA bulb but without stenosis compared to the more distal cervical ICA diameter. Vertebral arteries: Right vertebral artery origin is widely patent. 30% stenosis of the left vertebral artery origin. Both vessels widely patent beyond that through the cervical region to the foramen magnum. Skeleton: Ordinary cervical spondylosis and facet arthritis. Other neck: No mass or lymphadenopathy. Upper chest: Mild apical scarring. Review of the MIP images confirms the above  findings CTA HEAD FINDINGS Anterior circulation: Right internal carotid artery shows atherosclerotic calcification throughout the carotid siphon region with stenosis estimated at 50%. There is abrupt acute in near-total occlusion of the left M1 segment 8 mm from the origin. Small amount of flow seen in the more distal MCA branches, presumably due to leptomeningeal collaterals. Left anterior cerebral artery is widely patent. Left PCA takes fetal origin from the anterior circulation. Right internal carotid artery is patent through the skull base and siphon region. There is extensive calcified plaque throughout the siphon region with serial stenoses greater than 70%. The right middle cerebral artery is patent. Right A1 segment is hypoplastic. Right PCA takes fetal origin from the anterior circulation. Posterior circulation: Both vertebral arteries are patent through the foramen magnum to the basilar. Patent posteroinferior cerebellar arteries, anterior inferior cerebellar arteries and superior cerebellar arteries. Both posterior cerebral arteries take fetal circulation origin as noted above. Venous sinuses: Patent and normal. Anatomic variants: None other significant. Review of the MIP images confirms the above findings IMPRESSION: 1. Acute and near-total occlusion of the left M1 segment 8 mm from the origin. Small amount of flow in the more distal MCA branches, presumably due to leptomeningeal collaterals. 2. Atherosclerotic disease at both carotid bifurcations. 25% stenosis of the proximal ICA on the right. No measurable stenosis on the left. 3. Atherosclerotic disease in both carotid siphon regions with serial stenoses estimated at 70% on the right and 50% on the left. 4. 30% stenosis of the left vertebral artery origin. 5. Both posterior cerebral arteries take fetal origin from the anterior circulation. 6. Aortic atherosclerosis. 7. Findings discussed with Dr. Rory Percy and communicated by Amion at 0854 hours. Aortic  Atherosclerosis (ICD10-I70.0). Electronically Signed   By: Nelson Chimes M.D.   On: 01/24/2022 09:14   CT HEAD CODE STROKE WO CONTRAST  Result Date: 01/24/2022 CLINICAL DATA:  Code stroke. Neuro deficit, acute, stroke suspected. Gait disturbance. Right-sided weakness. EXAM: CT HEAD WITHOUT CONTRAST TECHNIQUE: Contiguous axial images were obtained from the base of the skull through the vertex without intravenous contrast. RADIATION DOSE REDUCTION: This exam was performed according to the departmental dose-optimization program which includes automated exposure control, adjustment of the mA and/or kV according to patient size and/or use of iterative reconstruction technique. COMPARISON:  06/25/2021 FINDINGS: Brain: Generalized volume loss. Old small vessel cerebellar infarctions. Old infarction in the left frontal operculum with volume loss and gliosis. No sign of acute infarction based on today's scan. No hemorrhage, hydrocephalus or extra-axial collection. Vascular: There is atherosclerotic calcification of the major vessels at the base of the brain. Skull: Normal Sinuses/Orbits: Mild inflammatory changes of the left maxillary sinus. Orbits negative. Other: None ASPECTS ([place] Stroke Program Early CT Score) - Ganglionic level infarction (caudate, lentiform nuclei, internal capsule, insula, M1-M3 cortex): 7, allowing for the old infarction. - Supraganglionic infarction (M4-M6 cortex): 3, allowing for the old infarction Total score (0-10 with 10 being normal): 10, allowing for the old infarction. IMPRESSION: 1. No acute CT finding. Old small-vessel cerebellar infarctions. Old  infarction in the left frontal operculum. 2. Aspects is 10, allowing for the old infarction. These results were communicated to Dr. Rory Percy at 8:51 am on 01/24/2022 by text page via the [facility] messaging system. Electronically Signed   By: Nelson Chimes M.D.   On: 01/24/2022 08:53    Labs:  CBC: Recent Labs    01/24/22 0835 01/24/22 0837   WBC 7.7  --   HGB 14.0 14.3  HCT 42.4 42.0  PLT 205  --     COAGS: Recent Labs    01/24/22 0835  INR 2.2*  APTT 30    BMP: Recent Labs    01/24/22 0835 01/24/22 0837  NA 137 140  K 3.5 3.6  CL 105 106  CO2 24  --   GLUCOSE 169* 168*  BUN 32* 31*  CALCIUM 9.0  --   CREATININE 1.35* 1.20  GFRNONAA 54*  --     LIVER FUNCTION TESTS: Recent Labs    01/24/22 0835  BILITOT 0.4  AST 28  ALT 21  ALKPHOS 42  PROT 7.1  ALBUMIN 3.9    Assessment and Plan:  Pt extubated PTA.  Noted to be resting in bed.  He is A&O, follows commands.  Pt to remain flat until 4 pm today.  Monitor R CFA site for bleeding, hematoma.  Plan per neuro team. IR to follow.  Please call with questions or concerns.   Electronically Signed: Tyson Alias, NP 01/24/2022, 3:58 PM   I spent a total of 15 Minutes at the the patient's bedside AND on the patient's hospital floor or unit, greater than 50% of which was counseling/coordinating care for s/p mechanical thrombectomy for occluded left middle cerebral artery M1 segment.

## 2022-01-24 NOTE — Progress Notes (Signed)
  Echocardiogram 2D Echocardiogram has been performed.  [name] 01/24/2022, 3:52 PM

## 2022-01-24 NOTE — ED Notes (Signed)
Patient transported to interventional radiology 

## 2022-01-24 NOTE — Code Documentation (Signed)
Stroke Response Nurse Documentation Code Documentation  [name] is a 76 y.o. male arriving to [facility]  via [facility] on 01/24/2022 with past medical hx of DM, CABG, mechanical valve. On warfarin daily. Code stroke was activated by EMS.   Patient from Home where he was LKW at [facility] and now complaining of Difficulty speaking and right side weakness.   Stroke team at the bedside on patient arrival. Labs drawn and patient cleared for CT by EDP. Patient to CT with team. NIHSS 20, see documentation for details and code stroke times. Patient with disoriented, not following commands, left gaze preference , right hemianopia, right facial droop, right arm weakness, right leg weakness, Global aphasia , and dysarthria  on exam. The following imaging was completed:  CT Head and CTA. Patient is not a candidate for IV Thrombolytic due to taking Coumadin. Patient is a candidate for IR.  He was taken to IR at 0853 .   Bedside handoff with IR staff  Marcellina Millin  Stroke Response RN

## 2022-01-24 NOTE — Procedures (Signed)
Extubation Procedure Note  Patient Details:   Name: [name] DOB: 1945-09-05 MRN: [id number]   Airway Documentation:    Vent end date: 01/24/22 Vent end time: 1423    Evaluation  O2 sats: stable throughout Complications: No apparent complications Patient did tolerate procedure well. Bilateral Breath Sounds: Clear, Diminished   Yes  Pt extubated to 3L Vivian per MD order. Pt had positive cuff leak prior to extubation. Pt stable throughout with no complications. VS WNL. No stridor noted.   Ermalinda Barrios M 01/24/2022, 2:24 PM

## 2022-01-24 NOTE — H&P (Signed)
Neurology H&P  CC: Aphasia and right-sided weakness  History is obtained from: EMS, family and chart  HPI: [name] is a 76 y.o. male with history of diabetes, CABG, mechanical heart valve on Coumadin and stroke who was last known well at 715 this morning.  He got up and was doing his ADLs, and at 740 waved to his wife from the bedroom and was noted to have aphasia and right-sided weakness.  EMS was called, and patient was brought to the hospital.  He was found to have left gaze deviation, right hemiparesis and global aphasia.  CT head showed no acute bleed, and CTA showed left M1 occlusion.  Consent was obtained from patient's wife, and patient was taken to IR for mechanical thrombectomy.   LKW: 0715 hrs TNK given?: No, on Coumadin with INR of 2.2 IR Thrombectomy?  Yes Modified Rankin Scale: 1-No significant post stroke disability and can perform usual duties with stroke symptoms Neuro IR contacted at 8:42 AM  NIHSS:  1a Level of Conscious.: 0 1b LOC Questions: 2 1c LOC Commands: 2 2 Best Gaze: 2 3 Visual: 2 4 Facial Palsy: 1 5a Motor Arm - left: 0 5b Motor Arm - Right: 4 6a Motor Leg - Left: 0 6b Motor Leg - Right: 2 7 Limb Ataxia: 0 8 Sensory: 0 9 Best Language: 3 10 Dysarthria: 2 11 Extinct and Inattention.: 0 TOTAL: 20  ROS: Unable to obtain due to altered mental status.   Past medical history:Ascending arctic aneurysm, embolic left MCA stroke with no residual deficits, lumbar stenosis, diabetes, dyslipidemia, coronary artery disease with history of CABG, hyperlipidemia, obesity, mechanical heart valve   History reviewed. No pertinent family history.   Social History:  has no history on file for tobacco use, alcohol use, and drug use.   Prior to Admission medications   Not on File     Exam: Current vital signs: BP (!) 152/71   Pulse 71   Resp 14   Wt 101.8 kg   SpO2 99%  Physical Exam  Constitutional: Appears well-developed and well-nourished.   Psych: Affect appropriate to situation Eyes: No scleral injection HENT: No OP obstrucion Head: Normocephalic.  GI: Soft.  No distension. There is no tenderness.  Skin: WDI  Neuro: Mental Status: Patient is alert but does not respond to name or follow commands expressive and receptive aphasia noted Cranial Nerves: II: Pupils are equal, round, and reactive to light.   III,IV, VI: Left gaze deviation VII: Right-sided facial droop Motor: Normal antigravity strength noted in left upper and left lower extremities, no movement of right upper extremity, some movement of right lower extremity and able to break gravity Sensory: Sensation appears intact to noxious stimuli bilaterally   I have reviewed labs in epic and the pertinent results are: CBC    Component Value Date/Time   WBC 7.7 01/24/2022 0835   RBC 4.72 01/24/2022 0835   HGB 14.3 01/24/2022 0837   HCT 42.0 01/24/2022 0837   PLT 205 01/24/2022 0835   MCV 89.8 01/24/2022 0835   MCH 29.7 01/24/2022 0835   MCHC 33.0 01/24/2022 0835   RDW 13.7 01/24/2022 0835   LYMPHSABS 1.4 01/24/2022 0835   MONOABS 0.7 01/24/2022 0835   EOSABS 0.2 01/24/2022 0835   BASOSABS 0.1 01/24/2022 0835       Latest Ref Rng & Units 01/24/2022    8:37 AM 01/24/2022    8:35 AM  BMP  Glucose 70 - 99 mg/dL 921  194  BUN 8 - 23 mg/dL 31  32   Creatinine 2.69 - 1.24 mg/dL 4.85  4.62   Sodium 703 - 145 mmol/L 140  137   Potassium 3.5 - 5.1 mmol/L 3.6  3.5   Chloride 98 - 111 mmol/L 106  105   CO2 22 - 32 mmol/L  24   Calcium 8.9 - 10.3 mg/dL  9.0    INR 2.2  I have reviewed the images obtained: CT head: No acute hemorrhage, dense left MCA sign  CTA head and neck: Acute occlusion of left M1  MRI brain pending  Impression: Acute ischemic stroke due to left M1 occlusion, patient taken for emergent mechanical thrombectomy  Recommendations: - Admit to ICU for post mechanical thrombectomy monitoring -Frequent neurochecks and NIHSS per  orders -Keep systolic blood pressure between 120 and 140 for first 24 hours, use Cleviprex if necessary - MRI brain wo contrast - TTE - Check A1c and LDL + add statin per guidelines - patient fully anticoagulated with Coumadin, will be placed on heparin drip after procedure as he is currently subtherapeutic - STAT head CT for any change in neuro exam - Tele - PT/OT/SLP - Stroke education - Amb referral to neurology upon discharge     This patient is critically ill and at significant risk of neurological worsening, death and care requires constant monitoring of vital signs, hemodynamics,respiratory and cardiac monitoring, neurological assessment, discussion with family, other specialists and medical decision making of high complexity. I spent 45 minutes of neurocritical care time  in the care of  this patient. This was time spent independent of any time provided by nurse practitioner or PA.  Assessment and plan discussed with with attending physician and they are in agreement.   Cortney E Ernestina Columbia , MSN, AGACNP-BC [facility] See Amion for schedule and pager information 01/24/2022 9:38 AM    Attending addendum Patient seen and examined as an acute code stroke, brought in by [facility]. Family reports waking up normally this morning, going about his ADLs, sitting on a computer but then at around 730 or 7:40 AM, waved to his wife to get some help.  Last known well was 7:15 AM by son. On arrival, forced left gaze, no blink to threat from the right, right facial weakness, flaccid right upper and lower extremity with an NIH stroke scale of around 20 as documented above. Noncontrast head CT personally reviewed-hyperdense left MCA. Personally reviewed CT angio head and neck with left M1 occlusion. No tPA/TNK due to being on Coumadin with an INR that was eventually 2.2. Taken for emergent thrombectomy. COVID test pending-will remain intubated and be admitted to the ICU for post  thrombectomy care. Blood pressure goal will be 120-140. Pharmacy consultation for heparinization given the presence of mechanical valve Stroke workup as above Full code DVT prophylaxis: Will be with heparin drip-currently therapeutic with an INR 2.2 on Coumadin, which is sufficient for DVT prophylaxis but might not be sufficient for prevention of thromboembolism from the mechanical valve-I would imagine goal INR 2.5-3.5 for him. Plan was discussed with EDP as well as interventionalist. Stroke team will continue to follow  -- Milon Dikes, MD Neurologist [facility] Pager: [phone]  CRITICAL CARE ATTESTATION Performed by: Milon Dikes, MD Total critical care time: 49 minutes Critical care time was exclusive of separately billable procedures and treating other patients and/or supervising APPs/Residents/Students Critical care was necessary to treat or prevent imminent or life-threatening deterioration. This patient is critically ill and at  significant risk for neurological worsening and/or death and care requires constant monitoring. Critical care was time spent personally by me on the following activities: development of treatment plan with patient and/or surrogate as well as nursing, discussions with consultants, evaluation of patient's response to treatment, examination of patient, obtaining history from patient or surrogate, ordering and performing treatments and interventions, ordering and review of laboratory studies, ordering and review of radiographic studies, pulse oximetry, re-evaluation of patient's condition, participation in multidisciplinary rounds and medical decision making of high complexity in the care of this patient.

## 2022-01-24 NOTE — Anesthesia Preprocedure Evaluation (Addendum)
Anesthesia Evaluation  Patient identified by MRN, date of birth, ID band Patient awake    Reviewed: Allergy & Precautions, NPO status , Patient's Chart, lab work & pertinent test resultsPreop documentation limited or incomplete due to emergent nature of procedure.  Airway Mallampati: IV  TM Distance: >3 FB Neck ROM: Limited    Dental  (+) Missing   Pulmonary neg pulmonary ROS   breath sounds clear to auscultation + decreased breath sounds      Cardiovascular + CAD   Rhythm:Regular Rate:Bradycardia     Neuro/Psych Code Stroke- right hemiplegia and aphasia Normal neuro status at 0715 this am CVA    GI/Hepatic   Endo/Other  diabetes, Type 2, Insulin Dependent    Renal/GU      Musculoskeletal   Abdominal   Peds  Hematology Coumadin therapy   Anesthesia Other Findings   Reproductive/Obstetrics                             Anesthesia Physical Anesthesia Plan  ASA: 4 and emergent  Anesthesia Plan: General   Post-op Pain Management: Minimal or no pain anticipated   Induction: Intravenous, Rapid sequence and Cricoid pressure planned  PONV Risk Score and Plan: 2 and Treatment may vary due to age or medical condition and Ondansetron  Airway Management Planned: Oral ETT  Additional Equipment: Arterial line  Intra-op Plan:   Post-operative Plan: Possible Post-op intubation/ventilation  Informed Consent: I have reviewed the patients History and Physical, chart, labs and discussed the procedure including the risks, benefits and alternatives for the proposed anesthesia with the patient or authorized representative who has indicated his/her understanding and acceptance.       Plan Discussed with: Anesthesiologist and CRNA  Anesthesia Plan Comments:         Anesthesia Quick Evaluation

## 2022-01-24 NOTE — Procedures (Signed)
INR. Status post left common carotid arteriogram.  Right CFA approach.   Findings. 1. Occluded left middle cerebral artery might M1 segment. Status post revascularization of occluded left middle cerebral artery M1 segment with 1 pass with 070 aspiration catheter achieving aTICI 3  revascularization. Post CT brain no evidence of intracranial hemorrhage. 8 French Angio-Seal closure device deployed for hemostasis at the right groin puncture site.  Distal pulses dopplerable unchanged. Patient left intubated pending results of COVID-19. Fatima Sanger MD

## 2022-01-24 NOTE — Anesthesia Procedure Notes (Signed)
Arterial Line Insertion Start/End12/26/2023 9:06 AM, 01/24/2022 9:09 AM Performed by: Mal Amabile, MD, anesthesiologist  Patient location: OOR procedure area. Preanesthetic checklist: patient identified, IV checked, site marked, risks and benefits discussed, surgical consent, monitors and equipment checked, pre-op evaluation, timeout performed and anesthesia consent Left, radial was placed Catheter size: 20 G Hand hygiene performed  and maximum sterile barriers used  Allen's test indicative of satisfactory collateral circulation Attempts: 1 Procedure performed without using ultrasound guided technique. Following insertion, dressing applied and Biopatch. Patient tolerated the procedure well with no immediate complications.

## 2022-01-24 NOTE — ED Triage Notes (Signed)
Patient BIB [facility] from home for evaluation of sudden onset of right sided weakness and expressive aphasia starting at 0740 this morning, last known well 0715 today. Patient was seen ambulating without complaints by family when he suddenly fell onto a pile of laundry and symptoms began. CBG 160, 16g saline lock in left AC.

## 2022-01-24 NOTE — Anesthesia Procedure Notes (Signed)
Procedure Name: Intubation Date/Time: 01/24/2022 9:07 AM  Performed by: Marena Chancy, CRNAPre-anesthesia Checklist: Patient identified, Emergency Drugs available, Suction available and Patient being monitored Patient Re-evaluated:Patient Re-evaluated prior to induction Oxygen Delivery Method: Circle System Utilized Preoxygenation: Pre-oxygenation with 100% oxygen Induction Type: IV induction, Rapid sequence and Cricoid Pressure applied Laryngoscope Size: Miller and 2 Grade View: Grade II Tube type: Oral Tube size: 7.5 mm Number of attempts: 1 Airway Equipment and Method: Stylet and Oral airway Placement Confirmation: ETT inserted through vocal cords under direct vision, positive ETCO2 and breath sounds checked- equal and bilateral Tube secured with: Tape Dental Injury: Teeth and Oropharynx as per pre-operative assessment

## 2022-01-24 NOTE — Consult Note (Signed)
NAME:  [name], MRN:  [id number], DOB:  26-Dec-1945, LOS: 0 ADMISSION DATE:  01/24/2022, CONSULTATION DATE:  01/24/22 REFERRING MD:  Wilford Corner CHIEF COMPLAINT:  Vent management post CVA   History of Present Illness:  [name] is a 76 y.o. male who has a PMH as below. He presented to [facility] ED 12/26 with aphasia, right sided weakness, left gaze deviation. He was found to have left M1 occlusion for which he was taken to neuro IR where he had complete revascularization of occlusion with 1 pass. Post procedure CT head negative for ICH. He remained ventilated post procedure given unknown COVID 19 status; hence, PCCM consulted for vent management.  After arrival to ICU, he was awake on 30 Propofol with great vent mechanics. Propofol lowered and pt placed on PSV 5/5. Pulling good volumes, following commands. RSBI ~30-35.  Pertinent  Medical History:  has Acute ischemic stroke [facility]) on their problem list.  Significant Hospital Events: Including procedures, antibiotic start and stop dates in addition to other pertinent events   12/26 admit.  Interim History / Subjective:  Vitals stable. Following commands. RSBI on PSV 5/5 is ~30-35.  Objective:  Blood pressure (!) 102/55, pulse (!) 49, temperature (!) 97 F (36.1 C), resp. rate 13, weight 101.8 kg, SpO2 100 %.    Vent Mode: PRVC FiO2 (%):  [100 %] 100 % Set Rate:  [12 bmp] 12 bmp Vt Set:  [600 mL] 600 mL PEEP:  [5 cmH20] 5 cmH20 Plateau Pressure:  [19 cmH20] 19 cmH20   Intake/Output Summary (Last 24 hours) at 01/24/2022 1028 Last data filed at 01/24/2022 1017 Gross per 24 hour  Intake 650 ml  Output 2975 ml  Net -2325 ml   Filed Weights   01/24/22 0800  Weight: 101.8 kg    Examination: General: Adult male, resting in bed, in NAD. Neuro: Waking up despite some sedation, following commands. Moving both hands, reaching for tube. HEENT: [place]/AT. Sclerae anicteric. ETT in place. Cardiovascular: Slightly brady, regular, no  M/R/G.  Lungs: Respirations even and unlabored.  CTA bilaterally, No W/R/R. Abdomen: BS x 4, soft, NT/ND.  Musculoskeletal: No gross deformities, no edema.  Skin: Intact, warm, no rashes.  Labs/imaging personally reviewed:  CT / CTA head 12/26 > Left M1 occlusion. Old infarction. MRI brain 12/26 >  Echo 12/26 >   Assessment & Plan:   L M1 occlusion - s/p IR revascularization. - Post procedure management per IR. - Stroke workup / management per neuro. - F/u on CT head, MRI brain, echo. - Goal SBP 120 - 140, can use Cleviprex PRN (currently not needed as is hypotensive).  Post operative ventilator management - in setting of above. Weaning well post op. - Extubate.  Hx OSA on CPAP. - Resume nocturnal CPAP once extubated.   Doing well post op, Will go ahead with extubation. Nothing further to add.  PCCM will sign off.  Please call us back if we can be of any further assistance.  Best practice (evaluated daily):  Per primary team.  Labs   CBC: Recent Labs  Lab 01/24/22 0835 01/24/22 0837  WBC 7.7  --   NEUTROABS 5.4  --   HGB 14.0 14.3  HCT 42.4 42.0  MCV 89.8  --   PLT 205  --     Basic Metabolic Panel: Recent Labs  Lab 01/24/22 0835 01/24/22 0837  NA 137 140  K 3.5 3.6  CL 105 106  CO2 24  --   GLUCOSE 169*  168*  BUN 32* 31*  CREATININE 1.35* 1.20  CALCIUM 9.0  --    GFR: CrCl cannot be calculated (Unknown ideal weight.). Recent Labs  Lab 01/24/22 0835  WBC 7.7    Liver Function Tests: Recent Labs  Lab 01/24/22 0835  AST 28  ALT 21  ALKPHOS 42  BILITOT 0.4  PROT 7.1  ALBUMIN 3.9   No results for input(s): "LIPASE", "AMYLASE" in the last 168 hours. No results for input(s): "AMMONIA" in the last 168 hours.  ABG    Component Value Date/Time   TCO2 21 (L) 01/24/2022 0837     Coagulation Profile: Recent Labs  Lab 01/24/22 0835  INR 2.2*    Cardiac Enzymes: No results for input(s): "CKTOTAL", "CKMB", "CKMBINDEX", "TROPONINI" in  the last 168 hours.  HbA1C: No results found for: "HGBA1C"  CBG: Recent Labs  Lab 01/24/22 0835  GLUCAP 159*    Review of Systems:   Unable to obtain as pt is encephalopathic.  Past Medical History:  He,  has no past medical history on file.   Surgical History:  History reviewed. No pertinent surgical history.   Social History:      Family History:  His family history is not on file.   Allergies Not on File   Home Medications  Prior to Admission medications   Not on File     Critical care time: 30 min.    Rutherford Guys, PA - [facility] For pager details, please see AMION or use Epic chat  After 1900, please call [facility] for cross coverage needs 01/24/2022, 2:11 PM

## 2022-01-24 NOTE — Progress Notes (Signed)
Transported pt from PACU to 4N32 on vent. Pt stable throughout with no complications, VS WNL

## 2022-01-24 NOTE — ED Provider Notes (Signed)
[facility] EMERGENCY DEPARTMENT Provider Note  CSN: [id number] Arrival date & time: 01/24/22 [id number]  Chief Complaint(s) Code Stroke  HPI [name] is a 76 y.o. male with history of diabetes, coronary artery disease, CABG, aortic valve replacement on warfarin, chronic kidney disease presenting to the emergency department with weakness.  At around 7:15 AM patient was noted to be suddenly weak on the right side.  He also was not speaking normally.  EMS was called by family.  History significantly limited due to patient nonverbal, acuity of condition  Past Medical History History reviewed. No pertinent past medical history. Patient Active Problem List   Diagnosis Date Noted   Acute ischemic stroke ([place]) 01/24/2022   Home Medication(s) Prior to Admission medications   Not on File                                                                                                                                    Past Surgical History History reviewed. No pertinent surgical history. Family History History reviewed. No pertinent family history.  Social History   Allergies Patient has no allergy information on record.  Review of Systems Review of Systems  Unable to perform ROS: Patient nonverbal    Physical Exam Vital Signs  I have reviewed the triage vital signs BP (!) 152/71   Pulse 71   Resp 14   Wt 101.8 kg   SpO2 99%  Physical Exam Vitals and nursing note reviewed.  Constitutional:      General: He is in acute distress.     Appearance: Normal appearance. He is diaphoretic.  HENT:     Mouth/Throat:     Mouth: Mucous membranes are moist.  Eyes:     Conjunctiva/sclera: Conjunctivae normal.  Cardiovascular:     Rate and Rhythm: Normal rate and regular rhythm.  Pulmonary:     Effort: Pulmonary effort is normal. No respiratory distress.     Breath sounds: Normal breath sounds.  Abdominal:     General: Abdomen is flat.     Palpations: Abdomen is  soft.     Tenderness: There is no abdominal tenderness.  Musculoskeletal:     Right lower leg: Edema present.     Left lower leg: Edema present.  Skin:    General: Skin is warm.     Capillary Refill: Capillary refill takes less than 2 seconds.  Neurological:     Mental Status: He is alert.     Comments: Unable to assess orientation as patient is aphasic.  Cranial nerves II through XII notable for slight right-sided facial droop.  Strength 3 out of 5 in the right upper and lower extremities.  5 out of 5 in the left upper and lower extremities.  Patient severely aphasic, possibly receptively aphasic as well  Psychiatric:        Mood and Affect: Mood normal.        Behavior:  Behavior normal.     ED Results and Treatments Labs (all labs ordered are listed, but only abnormal results are displayed) Labs Reviewed  PROTIME-INR - Abnormal; Notable for the following components:      Result Value   Prothrombin Time 24.1 (*)    INR 2.2 (*)    All other components within normal limits  COMPREHENSIVE METABOLIC PANEL - Abnormal; Notable for the following components:   Glucose, Bld 169 (*)    BUN 32 (*)    Creatinine, Ser 1.35 (*)    GFR, Estimated 54 (*)    All other components within normal limits  I-STAT CHEM 8, ED - Abnormal; Notable for the following components:   BUN 31 (*)    Glucose, Bld 168 (*)    Calcium, Ion 1.10 (*)    TCO2 21 (*)    All other components within normal limits  CBG MONITORING, ED - Abnormal; Notable for the following components:   Glucose-Capillary 159 (*)    All other components within normal limits  RESPIRATORY PANEL BY PCR  APTT  CBC  DIFFERENTIAL  ETHANOL  HEMOGLOBIN A1C                                                                                                                          Radiology CT ANGIO HEAD NECK W WO CM (CODE STROKE)  Result Date: 01/24/2022 CLINICAL DATA:  Acute right-sided weakness EXAM: CT ANGIOGRAPHY HEAD AND NECK  TECHNIQUE: Multidetector CT imaging of the head and neck was performed using the standard protocol during bolus administration of intravenous contrast. Multiplanar CT image reconstructions and MIPs were obtained to evaluate the vascular anatomy. Carotid stenosis measurements (when applicable) are obtained utilizing NASCET criteria, using the distal internal carotid diameter as the denominator. RADIATION DOSE REDUCTION: This exam was performed according to the departmental dose-optimization program which includes automated exposure control, adjustment of the mA and/or kV according to patient size and/or use of iterative reconstruction technique. CONTRAST:  87mL OMNIPAQUE IOHEXOL 350 MG/ML SOLN COMPARISON:  Head CT immediately prior FINDINGS: CTA NECK FINDINGS Aortic arch: Aortic atherosclerosis. Branching pattern is normal without origin stenosis. Right carotid system: Common carotid artery shows minimal scattered plaque but is widely patent to the bifurcation. Calcified plaque at the carotid bifurcation and proximal ICA. Minimal diameter is 3.6 mm. Compared to a more distal cervical ICA diameter of 4.8 mm, this indicates a 25% stenosis. Widely patent beyond that to the skull base. Left carotid system: Common carotid artery shows scattered plaque but is widely patent to the bifurcation. Calcified plaque at the bifurcation and ICA bulb but without stenosis compared to the more distal cervical ICA diameter. Vertebral arteries: Right vertebral artery origin is widely patent. 30% stenosis of the left vertebral artery origin. Both vessels widely patent beyond that through the cervical region to the foramen magnum. Skeleton: Ordinary cervical spondylosis and facet arthritis. Other neck: No mass or lymphadenopathy. Upper chest: Mild apical scarring. Review of the  MIP images confirms the above findings CTA HEAD FINDINGS Anterior circulation: Right internal carotid artery shows atherosclerotic calcification throughout the  carotid siphon region with stenosis estimated at 50%. There is abrupt acute in near-total occlusion of the left M1 segment 8 mm from the origin. Small amount of flow seen in the more distal MCA branches, presumably due to leptomeningeal collaterals. Left anterior cerebral artery is widely patent. Left PCA takes fetal origin from the anterior circulation. Right internal carotid artery is patent through the skull base and siphon region. There is extensive calcified plaque throughout the siphon region with serial stenoses greater than 70%. The right middle cerebral artery is patent. Right A1 segment is hypoplastic. Right PCA takes fetal origin from the anterior circulation. Posterior circulation: Both vertebral arteries are patent through the foramen magnum to the basilar. Patent posteroinferior cerebellar arteries, anterior inferior cerebellar arteries and superior cerebellar arteries. Both posterior cerebral arteries take fetal circulation origin as noted above. Venous sinuses: Patent and normal. Anatomic variants: None other significant. Review of the MIP images confirms the above findings IMPRESSION: 1. Acute and near-total occlusion of the left M1 segment 8 mm from the origin. Small amount of flow in the more distal MCA branches, presumably due to leptomeningeal collaterals. 2. Atherosclerotic disease at both carotid bifurcations. 25% stenosis of the proximal ICA on the right. No measurable stenosis on the left. 3. Atherosclerotic disease in both carotid siphon regions with serial stenoses estimated at 70% on the right and 50% on the left. 4. 30% stenosis of the left vertebral artery origin. 5. Both posterior cerebral arteries take fetal origin from the anterior circulation. 6. Aortic atherosclerosis. 7. Findings discussed with Dr. Rory Percy and communicated by Amion at 0854 hours. Aortic Atherosclerosis (ICD10-I70.0). Electronically Signed   By: Nelson Chimes M.D.   On: 01/24/2022 09:14   CT HEAD CODE STROKE WO  CONTRAST  Result Date: 01/24/2022 CLINICAL DATA:  Code stroke. Neuro deficit, acute, stroke suspected. Gait disturbance. Right-sided weakness. EXAM: CT HEAD WITHOUT CONTRAST TECHNIQUE: Contiguous axial images were obtained from the base of the skull through the vertex without intravenous contrast. RADIATION DOSE REDUCTION: This exam was performed according to the departmental dose-optimization program which includes automated exposure control, adjustment of the mA and/or kV according to patient size and/or use of iterative reconstruction technique. COMPARISON:  06/25/2021 FINDINGS: Brain: Generalized volume loss. Old small vessel cerebellar infarctions. Old infarction in the left frontal operculum with volume loss and gliosis. No sign of acute infarction based on today's scan. No hemorrhage, hydrocephalus or extra-axial collection. Vascular: There is atherosclerotic calcification of the major vessels at the base of the brain. Skull: Normal Sinuses/Orbits: Mild inflammatory changes of the left maxillary sinus. Orbits negative. Other: None ASPECTS ([place] Stroke Program Early CT Score) - Ganglionic level infarction (caudate, lentiform nuclei, internal capsule, insula, M1-M3 cortex): 7, allowing for the old infarction. - Supraganglionic infarction (M4-M6 cortex): 3, allowing for the old infarction Total score (0-10 with 10 being normal): 10, allowing for the old infarction. IMPRESSION: 1. No acute CT finding. Old small-vessel cerebellar infarctions. Old infarction in the left frontal operculum. 2. Aspects is 10, allowing for the old infarction. These results were communicated to Dr. Rory Percy at 8:51 am on 01/24/2022 by text page via the [facility] messaging system. Electronically Signed   By: Nelson Chimes M.D.   On: 01/24/2022 08:53    Pertinent labs & imaging results that were available during my care of the patient were reviewed by me and considered in my  medical decision making (see MDM for details).  Medications  Ordered in ED Medications   stroke: early stages of recovery book (has no administration in time range)  0.9 %  sodium chloride infusion (has no administration in time range)  acetaminophen (TYLENOL) tablet 650 mg (has no administration in time range)    Or  acetaminophen (TYLENOL) 160 MG/5ML solution 650 mg (has no administration in time range)    Or  acetaminophen (TYLENOL) suppository 650 mg (has no administration in time range)  senna-docusate (Senokot-S) tablet 1 tablet (has no administration in time range)  nitroGLYCERIN 100 mcg/mL intra-arterial injection (has no administration in time range)  ceFAZolin (ANCEF) 2-4 GM/100ML-% IVPB (has no administration in time range)  nitroGLYCERIN 25 mcg in sodium chloride (PF) 0.9 % 59.71 mL (25 mcg/mL) syringe (25 mcg Intra-arterial Given 01/24/22 0934)  iohexol (OMNIPAQUE) 350 MG/ML injection 75 mL (75 mLs Intravenous Contrast Given 01/24/22 0857)  iohexol (OMNIPAQUE) 300 MG/ML solution 100 mL (75 mLs Intra-arterial Contrast Given 01/24/22 0946)                                                                                                                                     Procedures .Critical Care  Performed by: Lonell Grandchild, MD Authorized by: Lonell Grandchild, MD   Critical care provider statement:    Critical care time (minutes):  30   Critical care was necessary to treat or prevent imminent or life-threatening deterioration of the following conditions:  CNS failure or compromise   Critical care was time spent personally by me on the following activities:  Development of treatment plan with patient or surrogate, discussions with consultants, evaluation of patient's response to treatment, examination of patient, ordering and review of laboratory studies, ordering and review of radiographic studies, ordering and performing treatments and interventions, pulse oximetry, re-evaluation of patient's condition and review of old  charts   (including critical care time)  Medical Decision Making / ED Course   MDM:  76 year old male presenting to the emergency department as a code stroke after developing weakness.  On exam, patient with aphasia and significant right-sided weakness.  Patient taken immediately to CT scanner, which revealed left MCA stroke with M1 thrombus.  Neurology spoke with the family and they agree with proceeding with thrombectomy.  Patient not tPA candidate given that he is on warfarin.  Patient will be admitted to neurology service.  Exam otherwise reassuring notable only for trace bilateral lower extremity edema.  No evidence of intracranial bleeding on head CT.  Doubt aortic dissection without sign of dissection on CT angiography.      Additional history obtained: -Additional history obtained from family and ems -External records from outside source obtained and reviewed including: Chart review including previous notes, labs, imaging, consultation notes including past PMD visit under alternative MRN   Lab Tests: -I ordered, reviewed, and interpreted labs.   The pertinent results  include:   Labs Reviewed  PROTIME-INR - Abnormal; Notable for the following components:      Result Value   Prothrombin Time 24.1 (*)    INR 2.2 (*)    All other components within normal limits  COMPREHENSIVE METABOLIC PANEL - Abnormal; Notable for the following components:   Glucose, Bld 169 (*)    BUN 32 (*)    Creatinine, Ser 1.35 (*)    GFR, Estimated 54 (*)    All other components within normal limits  I-STAT CHEM 8, ED - Abnormal; Notable for the following components:   BUN 31 (*)    Glucose, Bld 168 (*)    Calcium, Ion 1.10 (*)    TCO2 21 (*)    All other components within normal limits  CBG MONITORING, ED - Abnormal; Notable for the following components:   Glucose-Capillary 159 (*)    All other components within normal limits  RESPIRATORY PANEL BY PCR  APTT  CBC  DIFFERENTIAL  ETHANOL   HEMOGLOBIN A1C    Notable for elevated INR, elevated creatinine, mild hyperglycemia  EKG   EKG Interpretation  Date/Time:  Tuesday January 24 2022 08:55:04 EST Ventricular Rate:  60 PR Interval:  198 QRS Duration: 100 QT Interval:  426 QTC Calculation: 426 R Axis:   98 Text Interpretation: Sinus rhythm with Premature atrial complexes with Abberant conduction Possible Left atrial enlargement Rightward axis Anterior infarct , age undetermined ST & T wave abnormality, consider inferolateral ischemia Abnormal ECG No previous ECGs available Confirmed by Garnette Gunner [phone]) on 01/24/2022 9:52:32 AM         Imaging Studies ordered: I ordered imaging studies including CT and CTA head On my interpretation imaging demonstrates acute LVO I independently visualized and interpreted imaging. I agree with the radiologist interpretation   Medicines ordered and prescription drug management: Meds ordered this encounter  Medications   iohexol (OMNIPAQUE) 350 MG/ML injection 75 mL    stroke: early stages of recovery book   0.9 %  sodium chloride infusion   OR Linked Order Group    acetaminophen (TYLENOL) tablet 650 mg    acetaminophen (TYLENOL) 160 MG/5ML solution 650 mg    acetaminophen (TYLENOL) suppository 650 mg   senna-docusate (Senokot-S) tablet 1 tablet   nitroGLYCERIN 100 mcg/mL intra-arterial injection    Canipe, Heather J: cabinet override   ceFAZolin (ANCEF) 2-4 GM/100ML-% IVPB    Margaretmary Dys E: cabinet override   DISCONTD: nitroGLYCERIN 100 mcg/mL intra-arterial injection    Margaretmary Dys E: cabinet override   nitroGLYCERIN 25 mcg in sodium chloride (PF) 0.9 % 59.71 mL (25 mcg/mL) syringe   iohexol (OMNIPAQUE) 300 MG/ML solution 100 mL    -I have reviewed the patients home medicines and have made adjustments as needed   Consultations Obtained: I requested consultation with the neurologist,  and discussed lab and imaging findings as well as pertinent plan - they  recommend: IR thrombectomy   Cardiac Monitoring: The patient was maintained on a cardiac monitor.  I personally viewed and interpreted the cardiac monitored which showed an underlying rhythm of: NSR  Social Determinants of Health:  Diagnosis or treatment significantly limited by social determinants of health: obesity   Reevaluation: After the interventions noted above, I reevaluated the patient and found that they have stayed the same  Co morbidities that complicate the patient evaluation History reviewed. No pertinent past medical history.    Dispostion: Disposition decision including need for hospitalization was considered, and patient admitted to the  hospital.    Final Clinical Impression(s) / ED Diagnoses Final diagnoses:  Acute ischemic stroke [facility])     This chart was dictated using voice recognition software.  Despite best efforts to proofread,  errors can occur which can change the documentation meaning.    Cristie Hem, MD 01/24/22 [phone]

## 2022-01-24 NOTE — Transfer of Care (Signed)
Immediate Anesthesia Transfer of Care Note  Patient: [name]  Procedure(s) Performed: IR WITH ANESTHESIA  Patient Location: PACU  Anesthesia Type:General  Level of Consciousness: sedated and unresponsive  Airway & Oxygen Therapy: Patient remains intubated per anesthesia plan and Patient placed on Ventilator (see vital sign flow sheet for setting)  Post-op Assessment: Report given to RN and Post -op Vital signs reviewed and stable  Post vital signs: Reviewed and stable  Last Vitals:  Vitals Value Taken Time  BP 102/55 01/24/22 1015  Temp    Pulse 51 01/24/22 1017  Resp 0 01/24/22 1017  SpO2 100 % 01/24/22 1017  Vitals shown include unvalidated device data.  Last Pain: There were no vitals filed for this visit.       Complications: No notable events documented.

## 2022-01-24 NOTE — Progress Notes (Addendum)
ANTICOAGULATION CONSULT NOTE - Initial Consult  Pharmacy Consult:  Heparin Indication: History of mechanical valve  Not on File  Patient Measurements: Weight: 101.8 kg (224 lb 6.9 oz) Heparin Dosing Weight: 93 kg  Vital Signs: Temp: 97.2 F (36.2 C) (12/26 1200) BP: 111/61 (12/26 1300) Pulse Rate: 46 (12/26 1300)  Labs: Recent Labs    01/24/22 0835 01/24/22 0837  HGB 14.0 14.3  HCT 42.4 42.0  PLT 205  --   APTT 30  --   LABPROT 24.1*  --   INR 2.2*  --   CREATININE 1.35* 1.20    CrCl cannot be calculated (Unknown ideal weight.).   Medical History: History reviewed. No pertinent past medical history.   Assessment: 52 YOM admitted as a code stroke with near total occlusion of M1 segment, now s/p revascularization by IR.  Patient has a history of a [employer] mechanical AVR on Coumadin PTA.  He also had a CVA in May 2023 and was in Afib at that time.  INR sub-therapeutic on admit at 2.2.  Pharmacy consulted to bridge with IV heparin.  Will be conservative with initial dosing.  Baseline labs reviewed.  Goal of Therapy:  Heparin level 0.3-0.5 units/ml Monitor platelets by anticoagulation protocol: Yes   Plan:  Heparin infusion at 1150 units/hr - no bolus post IR and with INR of 2.2 Check 8 hr heparin level Daily heparin level and CBC  Kawanna Christley D. Laney Potash, PharmD, BCPS, BCCCP 01/24/2022, 1:29 PM

## 2022-01-24 NOTE — Anesthesia Postprocedure Evaluation (Signed)
Anesthesia Post Note  Patient: [name]  Procedure(s) Performed: IR WITH ANESTHESIA     Patient location during evaluation: PACU Anesthesia Type: General Level of consciousness: patient remains intubated per anesthesia plan Pain management: pain level controlled Vital Signs Assessment: post-procedure vital signs reviewed and stable Respiratory status: spontaneous breathing, nonlabored ventilation and respiratory function stable Cardiovascular status: blood pressure returned to baseline and stable Postop Assessment: no apparent nausea or vomiting Anesthetic complications: no   No notable events documented.  Last Vitals:  Vitals:   01/24/22 1145 01/24/22 1200  BP: (!) 119/59 (!) 120/58  Pulse: (!) 47 (!) 46  Resp: 14 14  Temp:    SpO2: 100% 100%    Last Pain: There were no vitals filed for this visit.               Nishika Parkhurst A.

## 2022-01-25 ENCOUNTER — Encounter (HOSPITAL_COMMUNITY): Payer: Self-pay | Admitting: Radiology

## 2022-01-25 ENCOUNTER — Inpatient Hospital Stay (HOSPITAL_COMMUNITY): Payer: Medicare HMO

## 2022-01-25 ENCOUNTER — Ambulatory Visit: Payer: Medicare HMO

## 2022-01-25 DIAGNOSIS — I639 Cerebral infarction, unspecified: Secondary | ICD-10-CM | POA: Diagnosis not present

## 2022-01-25 DIAGNOSIS — Z952 Presence of prosthetic heart valve: Secondary | ICD-10-CM

## 2022-01-25 DIAGNOSIS — Z7901 Long term (current) use of anticoagulants: Secondary | ICD-10-CM

## 2022-01-25 LAB — LIPID PANEL
Cholesterol: 119 mg/dL (ref 0–200)
HDL: 25 mg/dL — ABNORMAL LOW (ref >40)
LDL Cholesterol: UNDETERMINED mg/dL (ref 0–99)
Total CHOL/HDL Ratio: 4.8 RATIO
Triglycerides: 604 mg/dL — ABNORMAL HIGH (ref <150)
VLDL: UNDETERMINED mg/dL (ref 0–40)

## 2022-01-25 LAB — CBC WITH DIFFERENTIAL/PLATELET
Abs Immature Granulocytes: 0.05 10*3/uL (ref 0.00–0.07)
Basophils Absolute: 0.1 10*3/uL (ref 0.0–0.1)
Basophils Relative: 1 %
Eosinophils Absolute: 0.1 10*3/uL (ref 0.0–0.5)
Eosinophils Relative: 1 %
HCT: 37.3 % — ABNORMAL LOW (ref 39.0–52.0)
Hemoglobin: 13.2 g/dL (ref 13.0–17.0)
Immature Granulocytes: 1 %
Lymphocytes Relative: 9 %
Lymphs Abs: 1 10*3/uL (ref 0.7–4.0)
MCH: 30.8 pg (ref 26.0–34.0)
MCHC: 35.4 g/dL (ref 30.0–36.0)
MCV: 86.9 fL (ref 80.0–100.0)
Monocytes Absolute: 0.8 10*3/uL (ref 0.1–1.0)
Monocytes Relative: 8 %
Neutro Abs: 8.5 10*3/uL — ABNORMAL HIGH (ref 1.7–7.7)
Neutrophils Relative %: 80 %
Platelets: 202 10*3/uL (ref 150–400)
RBC: 4.29 MIL/uL (ref 4.22–5.81)
RDW: 13.8 % (ref 11.5–15.5)
WBC: 10.5 10*3/uL (ref 4.0–10.5)
nRBC: 0 % (ref 0.0–0.2)

## 2022-01-25 LAB — BASIC METABOLIC PANEL
Anion gap: 12 (ref 5–15)
BUN: 26 mg/dL — ABNORMAL HIGH (ref 8–23)
CO2: 21 mmol/L — ABNORMAL LOW (ref 22–32)
Calcium: 8.7 mg/dL — ABNORMAL LOW (ref 8.9–10.3)
Chloride: 103 mmol/L (ref 98–111)
Creatinine, Ser: 1.32 mg/dL — ABNORMAL HIGH (ref 0.61–1.24)
GFR, Estimated: 56 mL/min — ABNORMAL LOW (ref >60)
Glucose, Bld: 167 mg/dL — ABNORMAL HIGH (ref 70–99)
Potassium: 3.6 mmol/L (ref 3.5–5.1)
Sodium: 136 mmol/L (ref 135–145)

## 2022-01-25 LAB — GLUCOSE, CAPILLARY
Glucose-Capillary: 148 mg/dL — ABNORMAL HIGH (ref 70–99)
Glucose-Capillary: 172 mg/dL — ABNORMAL HIGH (ref 70–99)
Glucose-Capillary: 232 mg/dL — ABNORMAL HIGH (ref 70–99)
Glucose-Capillary: 226 mg/dL — ABNORMAL HIGH (ref 70–99)

## 2022-01-25 LAB — HEMOGLOBIN A1C
Hgb A1c MFr Bld: 8.2 % — ABNORMAL HIGH (ref 4.8–5.6)
Mean Plasma Glucose: 189 mg/dL

## 2022-01-25 LAB — PROTIME-INR
INR: 2.3 — ABNORMAL HIGH (ref 0.8–1.2)
Prothrombin Time: 24.8 seconds — ABNORMAL HIGH (ref 11.4–15.2)

## 2022-01-25 LAB — HEPARIN LEVEL (UNFRACTIONATED): Heparin Unfractionated: 0.25 IU/mL — ABNORMAL LOW (ref 0.30–0.70)

## 2022-01-25 MED ORDER — PANTOPRAZOLE SODIUM 40 MG PO TBEC
40.0000 mg | DELAYED_RELEASE_TABLET | Freq: Every day | ORAL | Status: DC
  Administered 2022-01-25 – 2022-01-26 (×2): 40 mg via ORAL
  Filled 2022-01-25 (×2): qty 1

## 2022-01-25 MED ORDER — CLOPIDOGREL BISULFATE 75 MG PO TABS
75.0000 mg | ORAL_TABLET | Freq: Every day | ORAL | Status: DC
  Administered 2022-01-25 – 2022-01-27 (×3): 75 mg via ORAL
  Filled 2022-01-25 (×3): qty 1

## 2022-01-25 MED ORDER — WARFARIN SODIUM 7.5 MG PO TABS
7.5000 mg | ORAL_TABLET | ORAL | Status: AC
  Administered 2022-01-25: 7.5 mg via ORAL
  Filled 2022-01-25: qty 1

## 2022-01-25 MED ORDER — ENOXAPARIN SODIUM 100 MG/ML IJ SOSY
100.0000 mg | PREFILLED_SYRINGE | Freq: Two times a day (BID) | INTRAMUSCULAR | Status: DC
  Administered 2022-01-26 – 2022-01-27 (×3): 100 mg via SUBCUTANEOUS
  Filled 2022-01-25 (×5): qty 1

## 2022-01-25 MED ORDER — FENOFIBRATE 160 MG PO TABS
160.0000 mg | ORAL_TABLET | Freq: Every day | ORAL | Status: DC
  Administered 2022-01-25 – 2022-01-27 (×3): 160 mg via ORAL
  Filled 2022-01-25 (×3): qty 1

## 2022-01-25 MED ORDER — ISOSORBIDE MONONITRATE ER 30 MG PO TB24
30.0000 mg | ORAL_TABLET | Freq: Every day | ORAL | Status: DC
  Administered 2022-01-25 – 2022-01-27 (×3): 30 mg via ORAL
  Filled 2022-01-25 (×3): qty 1

## 2022-01-25 MED ORDER — ENOXAPARIN SODIUM 100 MG/ML IJ SOSY
100.0000 mg | PREFILLED_SYRINGE | Freq: Once | INTRAMUSCULAR | Status: AC
  Administered 2022-01-25: 100 mg via SUBCUTANEOUS
  Filled 2022-01-25: qty 1

## 2022-01-25 MED ORDER — TAMSULOSIN HCL 0.4 MG PO CAPS
0.4000 mg | ORAL_CAPSULE | Freq: Every day | ORAL | Status: AC
  Administered 2022-01-25 – 2022-01-27 (×3): 0.4 mg via ORAL
  Filled 2022-01-25 (×3): qty 1

## 2022-01-25 MED ORDER — FUROSEMIDE 20 MG PO TABS
20.0000 mg | ORAL_TABLET | Freq: Every day | ORAL | Status: DC
  Administered 2022-01-25 – 2022-01-27 (×3): 20 mg via ORAL
  Filled 2022-01-25 (×3): qty 1

## 2022-01-25 MED ORDER — ROSUVASTATIN CALCIUM 20 MG PO TABS
40.0000 mg | ORAL_TABLET | Freq: Every day | ORAL | Status: DC
  Administered 2022-01-25 – 2022-01-27 (×3): 40 mg via ORAL
  Filled 2022-01-25 (×3): qty 2

## 2022-01-25 MED ORDER — METOPROLOL SUCCINATE ER 25 MG PO TB24
25.0000 mg | ORAL_TABLET | Freq: Every day | ORAL | Status: DC
  Administered 2022-01-25 – 2022-01-27 (×3): 25 mg via ORAL
  Filled 2022-01-25 (×3): qty 1

## 2022-01-25 MED ORDER — COLCHICINE 0.6 MG PO TABS: 0.6000 mg | ORAL_TABLET | Freq: Two times a day (BID) | ORAL | Status: DC | PRN

## 2022-01-25 MED ORDER — WARFARIN - PHARMACIST DOSING INPATIENT: Freq: Every day | Status: DC

## 2022-01-25 MED ORDER — AMLODIPINE BESYLATE 5 MG PO TABS
5.0000 mg | ORAL_TABLET | Freq: Every day | ORAL | Status: DC
  Administered 2022-01-25 – 2022-01-27 (×3): 5 mg via ORAL
  Filled 2022-01-25 (×3): qty 1

## 2022-01-25 NOTE — Progress Notes (Addendum)
STROKE TEAM PROGRESS NOTE   ATTENDING NOTE: I reviewed above note and agree with the assessment and plan. Pt was seen and examined.   76 year old male with history of diabetes, stroke, CAD status post CABG, aortic mechanical valve on Coumadin admitted for right-sided weakness, aphasia, left gaze.  INR 2.2.  CT old left frontal infarct, no acute infarct.  CTA head and neck left M1 occlusion, bilateral siphon stenosis right 70%, left 50%, bilateral fetal PCAs.  Status post IR with TICI3.  MRI mild patchy infarct at left BG and caudate area, punctate acute infarcts in the right MCA/ACA territory.  MRI showed left M1 patent.  EF 50 to 55%, LDL pending, A1c 8.2, TG 604.  Creatinine 1.2  On exam, patient awake alert, orientated x 3, hard of hearing.  No aphasia, follows all simple commands, able to name and repeat.  Only has right mild nasolabial fold flattening, otherwise neurologically intact.  Etiology for patient stroke likely cardioembolic from mechanical aortic valve on Coumadin with subtherapeutic INR.  His INR goal 2.5-3.5.  Currently on heparin IV, will transition back to Coumadin with INR goal 2.5-3.5 and bridged with Lovenox.  Continue home medication including Plavix, Tricor and Crestor 40.  PT/OT no recommendation.  For detailed assessment and plan, please refer to above/below as I have made changes wherever appropriate.   Marvel Plan, MD PhD Stroke Neurology 01/25/2022 5:53 PM  This patient is critically ill due to left MCA stroke with left M1 occlusion, status post mechanical thrombectomy, mechanical valve with subtherapeutic INR and at significant risk of neurological worsening, death form recurrent stroke, hemorrhagic transformation, bleeding from anticoagulation. This patient's care requires constant monitoring of vital signs, hemodynamics, respiratory and cardiac monitoring, review of multiple databases, neurological assessment, discussion with family, other specialists and medical  decision making of high complexity. I spent 35 minutes of neurocritical care time in the care of this patient. I had long discussion with patient and wife at bedside, updated pt current condition, treatment plan and potential prognosis, and answered all the questions.  They expressed understanding and appreciation.      INTERVAL HISTORY Patient in chair, wife at bedside. NAD. Plan for today is MRI and restart home BP medications. 12/28 consult pharmacy for coumadin dose adjustment and monitoring with Lovenox bridge. INR was 2.2 on arrival and needs to be 2.5-3.5 for mechanical heart valve.  Vitals:   01/25/22 0630 01/25/22 0645 01/25/22 0700 01/25/22 0715  BP: (!) 124/58 128/88 136/66 (!) 145/64  Pulse: 62 71 69 73  Resp: 18 17 (!) 21 20  Temp:      TempSrc:      SpO2: 92% 93% 92% 93%  Weight:       CBC:  Recent Labs  Lab 01/24/22 0835 01/24/22 0837 01/25/22 0458  WBC 7.7  --  10.5  NEUTROABS 5.4  --  8.5*  HGB 14.0 14.3 13.2  HCT 42.4 42.0 37.3*  MCV 89.8  --  86.9  PLT 205  --  202   Basic Metabolic Panel:  Recent Labs  Lab 01/24/22 0835 01/24/22 0837 01/25/22 0458  NA 137 140 136  K 3.5 3.6 3.6  CL 105 106 103  CO2 24  --  21*  GLUCOSE 169* 168* 167*  BUN 32* 31* 26*  CREATININE 1.35* 1.20 1.32*  CALCIUM 9.0  --  8.7*   Lipid Panel:  Recent Labs  Lab 01/25/22 0458  CHOL 119  TRIG 604*  HDL 25*  CHOLHDL 4.8  VLDL UNABLE TO CALCULATE IF TRIGLYCERIDE OVER 400 mg/dL  LDLCALC UNABLE TO CALCULATE IF TRIGLYCERIDE OVER 400 mg/dL   ONGE9B:  Recent Labs  Lab 01/24/22 0835  HGBA1C 8.2*   Alcohol Level  Recent Labs  Lab 01/24/22 0835  ETH <10    IMAGING past 24 hours ECHOCARDIOGRAM COMPLETE  Result Date: 01/24/2022    ECHOCARDIOGRAM REPORT   Patient Name:   [name] Date of Exam: 01/24/2022 Medical Rec #:  [id number]      Height: Accession #:    [id number]     Weight:       224.4 lb Date of Birth:  December 25, 1945      BSA:          2.181 m Patient  Age:    76 years       BP:           111/61 mmHg Patient Gender: M              HR:           72 bpm. Exam Location:  Inpatient Procedure: 2D Echo, Cardiac Doppler, Color Doppler and Intracardiac            Opacification Agent Indications:   Stroke I63.9  History:       Patient has no prior history of Echocardiogram examinations.                Stroke.  Sonographer:   Eulah Pont RDCS Referring      [id number] ASHISH ARORA Phys:  Sonographer Comments: Image acquisition challenging due to respiratory motion. IMPRESSIONS  1. Left ventricular ejection fraction, by estimation, is 50 to 55%. The left ventricle has low normal function. The left ventricle has no regional wall motion abnormalities. There is mild asymmetric left ventricular hypertrophy of the basal and septal segments. Left ventricular diastolic parameters were normal.  2. Right ventricular systolic function is normal. The right ventricular size is normal.  3. Left atrial size was moderately dilated.  4. The mitral valve is abnormal. Trivial mitral valve regurgitation. No evidence of mitral stenosis.  5. Mean gradient across AV 8 peak 11 mmHg AVA 2.5 cm 2 no stenosis Mechanical AVR not well seen and no records of valve type . The aortic valve has been repaired/replaced. There is moderate calcification of the aortic valve. There is moderate thickening  of the aortic valve. Aortic valve regurgitation is not visualized. Aortic valve sclerosis/calcification is present, without any evidence of aortic stenosis.  6. Aortic dilatation noted. There is severe dilatation of the ascending aorta, measuring 46 mm.  7. The inferior vena cava is normal in size with greater than 50% respiratory variability, suggesting right atrial pressure of 3 mmHg. FINDINGS  Left Ventricle: Septal hypokinesis small area of apical dyskinesis /aneurysm no mural thrombus. Left ventricular ejection fraction, by estimation, is 50 to 55%. The left ventricle has low normal function. The left  ventricle has no regional wall motion abnormalities. Definity contrast agent was given IV to delineate the left ventricular endocardial borders. The left ventricular internal cavity size was normal in size. There is mild asymmetric left ventricular hypertrophy of the basal and septal segments. Left ventricular diastolic parameters were normal. Right Ventricle: The right ventricular size is normal. No increase in right ventricular wall thickness. Right ventricular systolic function is normal. Left Atrium: Left atrial size was moderately dilated. Right Atrium: Right atrial size was normal in size. Pericardium: There is no evidence of pericardial effusion. Mitral Valve: The  mitral valve is abnormal. There is moderate thickening of the mitral valve leaflet(s). There is moderate calcification of the mitral valve leaflet(s). Trivial mitral valve regurgitation. No evidence of mitral valve stenosis. Tricuspid Valve: The tricuspid valve is normal in structure. Tricuspid valve regurgitation is mild . No evidence of tricuspid stenosis. Aortic Valve: Mean gradient across AV 8 peak 11 mmHg AVA 2.5 cm 2 no stenosis Mechanical AVR not well seen and no records of valve type. The aortic valve has been repaired/replaced. There is moderate calcification of the aortic valve. There is moderate thickening of the aortic valve. Aortic valve regurgitation is not visualized. Aortic valve sclerosis/calcification is present, without any evidence of aortic stenosis. Aortic valve mean gradient measures 8.7 mmHg. Aortic valve peak gradient measures 13.5  mmHg. Aortic valve area, by VTI measures 2.78 cm. Pulmonic Valve: The pulmonic valve was normal in structure. Pulmonic valve regurgitation is not visualized. No evidence of pulmonic stenosis. Aorta: Aortic dilatation noted. There is severe dilatation of the ascending aorta, measuring 46 mm. Venous: The inferior vena cava is normal in size with greater than 50% respiratory variability, suggesting  right atrial pressure of 3 mmHg. IAS/Shunts: No atrial level shunt detected by color flow Doppler.  LEFT VENTRICLE PLAX 2D LVIDd:         4.10 cm     Diastology LVIDs:         2.90 cm     LV e' medial:    5.26 cm/s LV PW:         1.10 cm     LV E/e' medial:  27.9 LV IVS:        1.10 cm     LV e' lateral:   10.70 cm/s LVOT diam:     2.10 cm     LV E/e' lateral: 13.7 LV SV:         137 LV SV Index:   63 LVOT Area:     3.46 cm  LV Volumes (MOD) LV vol d, MOD A2C: 53.1 ml LV vol d, MOD A4C: 88.3 ml LV vol s, MOD A2C: 18.2 ml LV vol s, MOD A4C: 28.8 ml LV SV MOD A2C:     34.9 ml LV SV MOD A4C:     88.3 ml LV SV MOD BP:      48.0 ml RIGHT VENTRICLE RV S prime:     10.50 cm/s TAPSE (M-mode): 2.2 cm LEFT ATRIUM             Index        RIGHT ATRIUM           Index LA diam:        4.80 cm 2.20 cm/m   RA Area:     23.30 cm LA Vol (A2C):   73.8 ml 33.84 ml/m  RA Volume:   64.80 ml  29.72 ml/m LA Vol (A4C):   79.7 ml 36.55 ml/m LA Biplane Vol: 75.4 ml 34.58 ml/m  AORTIC VALVE AV Area (Vmax):    2.71 cm AV Area (Vmean):   2.47 cm AV Area (VTI):     2.78 cm AV Vmax:           184.00 cm/s AV Vmean:          140.000 cm/s AV VTI:            0.494 m AV Peak Grad:      13.5 mmHg AV Mean Grad:      8.7 mmHg LVOT Vmax:  144.00 cm/s LVOT Vmean:        99.800 cm/s LVOT VTI:          0.396 m LVOT/AV VTI ratio: 0.80  AORTA Ao Root diam: 4.10 cm Ao Asc diam:  4.60 cm MITRAL VALVE                TRICUSPID VALVE MV Area (PHT): 2.20 cm     TR Peak grad:   32.3 mmHg MV Decel Time: 345 msec     TR Vmax:        284.00 cm/s MV E velocity: 147.00 cm/s MV A velocity: 116.00 cm/s  SHUNTS MV E/A ratio:  1.27         Systemic VTI:  0.40 m                             Systemic Diam: 2.10 cm Charlton Haws MD Electronically signed by Charlton Haws MD Signature Date/Time: 01/24/2022/6:14:46 PM    Final    CT ANGIO HEAD NECK W WO CM (CODE STROKE)  Result Date: 01/24/2022 CLINICAL DATA:  Acute right-sided weakness EXAM: CT ANGIOGRAPHY HEAD  AND NECK TECHNIQUE: Multidetector CT imaging of the head and neck was performed using the standard protocol during bolus administration of intravenous contrast. Multiplanar CT image reconstructions and MIPs were obtained to evaluate the vascular anatomy. Carotid stenosis measurements (when applicable) are obtained utilizing NASCET criteria, using the distal internal carotid diameter as the denominator. RADIATION DOSE REDUCTION: This exam was performed according to the departmental dose-optimization program which includes automated exposure control, adjustment of the mA and/or kV according to patient size and/or use of iterative reconstruction technique. CONTRAST:  75mL OMNIPAQUE IOHEXOL 350 MG/ML SOLN COMPARISON:  Head CT immediately prior FINDINGS: CTA NECK FINDINGS Aortic arch: Aortic atherosclerosis. Branching pattern is normal without origin stenosis. Right carotid system: Common carotid artery shows minimal scattered plaque but is widely patent to the bifurcation. Calcified plaque at the carotid bifurcation and proximal ICA. Minimal diameter is 3.6 mm. Compared to a more distal cervical ICA diameter of 4.8 mm, this indicates a 25% stenosis. Widely patent beyond that to the skull base. Left carotid system: Common carotid artery shows scattered plaque but is widely patent to the bifurcation. Calcified plaque at the bifurcation and ICA bulb but without stenosis compared to the more distal cervical ICA diameter. Vertebral arteries: Right vertebral artery origin is widely patent. 30% stenosis of the left vertebral artery origin. Both vessels widely patent beyond that through the cervical region to the foramen magnum. Skeleton: Ordinary cervical spondylosis and facet arthritis. Other neck: No mass or lymphadenopathy. Upper chest: Mild apical scarring. Review of the MIP images confirms the above findings CTA HEAD FINDINGS Anterior circulation: Right internal carotid artery shows atherosclerotic calcification  throughout the carotid siphon region with stenosis estimated at 50%. There is abrupt acute in near-total occlusion of the left M1 segment 8 mm from the origin. Small amount of flow seen in the more distal MCA branches, presumably due to leptomeningeal collaterals. Left anterior cerebral artery is widely patent. Left PCA takes fetal origin from the anterior circulation. Right internal carotid artery is patent through the skull base and siphon region. There is extensive calcified plaque throughout the siphon region with serial stenoses greater than 70%. The right middle cerebral artery is patent. Right A1 segment is hypoplastic. Right PCA takes fetal origin from the anterior circulation. Posterior circulation: Both vertebral arteries are patent through the  foramen magnum to the basilar. Patent posteroinferior cerebellar arteries, anterior inferior cerebellar arteries and superior cerebellar arteries. Both posterior cerebral arteries take fetal circulation origin as noted above. Venous sinuses: Patent and normal. Anatomic variants: None other significant. Review of the MIP images confirms the above findings IMPRESSION: 1. Acute and near-total occlusion of the left M1 segment 8 mm from the origin. Small amount of flow in the more distal MCA branches, presumably due to leptomeningeal collaterals. 2. Atherosclerotic disease at both carotid bifurcations. 25% stenosis of the proximal ICA on the right. No measurable stenosis on the left. 3. Atherosclerotic disease in both carotid siphon regions with serial stenoses estimated at 70% on the right and 50% on the left. 4. 30% stenosis of the left vertebral artery origin. 5. Both posterior cerebral arteries take fetal origin from the anterior circulation. 6. Aortic atherosclerosis. 7. Findings discussed with Dr. Wilford Corner and communicated by Amion at 0854 hours. Aortic Atherosclerosis (ICD10-I70.0). Electronically Signed   By: Paulina Fusi M.D.   On: 01/24/2022 09:14   CT HEAD CODE  STROKE WO CONTRAST  Result Date: 01/24/2022 CLINICAL DATA:  Code stroke. Neuro deficit, acute, stroke suspected. Gait disturbance. Right-sided weakness. EXAM: CT HEAD WITHOUT CONTRAST TECHNIQUE: Contiguous axial images were obtained from the base of the skull through the vertex without intravenous contrast. RADIATION DOSE REDUCTION: This exam was performed according to the departmental dose-optimization program which includes automated exposure control, adjustment of the mA and/or kV according to patient size and/or use of iterative reconstruction technique. COMPARISON:  06/25/2021 FINDINGS: Brain: Generalized volume loss. Old small vessel cerebellar infarctions. Old infarction in the left frontal operculum with volume loss and gliosis. No sign of acute infarction based on today's scan. No hemorrhage, hydrocephalus or extra-axial collection. Vascular: There is atherosclerotic calcification of the major vessels at the base of the brain. Skull: Normal Sinuses/Orbits: Mild inflammatory changes of the left maxillary sinus. Orbits negative. Other: None ASPECTS ([place]berta Stroke Program Early CT Score) - Ganglionic level infarction (caudate, lentiform nuclei, internal capsule, insula, M1-M3 cortex): 7, allowing for the old infarction. - Supraganglionic infarction (M4-M6 cortex): 3, allowing for the old infarction Total score (0-10 with 10 being normal): 10, allowing for the old infarction. IMPRESSION: 1. No acute CT finding. Old small-vessel cerebellar infarctions. Old infarction in the left frontal operculum. 2. Aspects is 10, allowing for the old infarction. These results were communicated to Dr. Wilford Corner at 8:51 am on 01/24/2022 by text page via the [facility] messaging system. Electronically Signed   By: Paulina Fusi M.D.   On: 01/24/2022 08:53    PHYSICAL EXAM Constitutional: Appears well-developed and well-nourished.  Psych: Affect appropriate to situation Eyes: No scleral injection HENT: No OP obstruction; hard of  hearing at baseline Head: Normocephalic.  GI: Soft.  No distension. There is no tenderness.  Skin: WDI   Neuro: Mental Status: Patient is awake, alert. Able to follow simple commands. Har Cranial Nerves: II: Pupils are equal, round, and reactive to light.   III,IV, VI: EOEMI, tongue protrudes midline. VII: slight Right-sided facial droop Motor: Normal antigravity in all 4 extremities. Sensory: Sensation intact to LT in all 4 extremities  ASSESSMENT/PLAN [name] is a 76 y.o. male with history of diabetes, CABG, mechanical heart valve on Coumadin and stroke who was last known well at 715 this morning.  He got up and was doing his ADLs, and at 740 waved to his wife from the bedroom and was noted to have aphasia and right-sided weakness.  EMS was  called, and patient was brought to the hospital.  He was found to have left gaze deviation, right hemiparesis and global aphasia.  CT head showed no acute bleed, and CTA showed left M1 occlusion.  Consent was obtained from patient's wife, and patient was taken to IR for mechanical thrombectomy.  Initial NIHSS:20  Stroke:   left MCA occlusion s/p TICI 3 with revascularization Etiology:  likely thrombotic d/t mechanical heart valve and Coumadin at non-therapeutic level. Code Stroke CT head 1. No acute CT finding. Old small-vessel cerebellar infarctions. Old infarction in the left frontal operculum. 2. Aspects is 10, allowing for the old infarction.   CTA head & neck 1. Acute and near-total occlusion of the left M1 segment 8 mm from the origin. Small amount of flow in the more distal MCA branches,presumably due to leptomeningeal collaterals. 2. Atherosclerotic disease at both carotid bifurcations. 25% stenosis of the proximal ICA on the right. No measurable stenosis on the left. 3. Atherosclerotic disease in both carotid siphon regions with serial stenoses estimated at 70% on the right and 50% on the left. 4. 30% stenosis of the left vertebral  artery origin. 5. Both posterior cerebral arteries take fetal origin from the anterior circulation.6. Aortic atherosclerosis. MRI  pending MRA  pending 2D echo EF 50-55% LDL UNABLE TO CALCULATE IF TRIGLYCERIDE OVER 400 mg/dL [id number] 8.2 VTE prophylaxis - Heparin    Diet   Diet heart healthy/carb modified Room service appropriate? Yes with Assist; Fluid consistency: Thin   warfarin daily prior to admission, now on heparin IV. 12/28 will start Coumadin with Lovenox bridge per pharmacy recommendations. Therapy recommendations:  pending Disposition:  pending  Hypertension Home meds:  metoprolol XL 25 mg daily, Norvasc 5mg  daily Stable Permissive hypertension (OK if < 220/120) but gradually normalize in 5-7 days Long-term BP goal normotensive  Hyperlipidemia Home meds:  crestor 40mg  daily, resumed in hospital LDL UNABLE TO CALCULATE IF TRIGLYCERIDE OVER 400 mg/dL, goal < 70 Continue statin at discharge  Diabetes type II UnControlled Home meds:  insulin glargine 22u at bedtime  HgbA1c 8.2, goal < 7.0 CBGs Recent Labs    01/24/22 1012 01/24/22 2138 01/25/22 0805  GLUCAP 173* 298* 148*    SSI  Other Stroke Risk Factors Advanced Age >/= 65  Obesity, There is no height or weight on file to calculate BMI., BMI >/= 30 associated with increased stroke risk, recommend weight loss, diet and exercise as appropriate    Hospital day # 1  Valentina Lucks, MSN, NP-C [facility] See AMION or use Epic Chat   To contact Stroke Continuity provider, please refer to [email or web address]. After hours, contact General Neurology

## 2022-01-25 NOTE — Evaluation (Signed)
Physical Therapy Evaluation Patient Details Name: [name] MRN: [id number] DOB: 1945-12-24 Today's Date: 01/25/2022  History of Present Illness  76 yo male presents to [facility] on 12/26 with aphasia, R weakness, L gaze. CT head showed no acute bleed, and CTA showed left M1 occlusion. S/p L common carotid arteriogram, revascularization of occluded L MCA M1 segment achieving aTICI 3 revascularization on 12/26. PMH includes DM, CABG, mechanical heart valve on coumadin.  Clinical Impression   Pt presents with generalized weakness but no focal R weakness on eval, impaired balance, and decreased activity tolerance vs baseline. Pt to benefit from acute PT to address deficits. Pt ambulated good hallway distance with use of RW and increased time, requires light physical assist for transitional movements at this time but anticipate pt will progress well with continued recovery. Anticipate no follow up PT needs at this time, but PT to progress mobility as tolerated, and will continue to follow acutely.         Recommendations for follow up therapy are one component of a multi-disciplinary discharge planning process, led by the attending physician.  Recommendations may be updated based on patient status, additional functional criteria and insurance authorization.  Follow Up Recommendations No PT follow up      Assistance Recommended at Discharge Intermittent Supervision/Assistance  Patient can return home with the following  A little help with walking and/or transfers;A little help with bathing/dressing/bathroom    Equipment Recommendations None recommended by PT  Recommendations for Other Services       Functional Status Assessment Patient has had a recent decline in their functional status and demonstrates the ability to make significant improvements in function in a reasonable and predictable amount of time.     Precautions / Restrictions Precautions Precautions: Fall Restrictions Weight  Bearing Restrictions: No      Mobility  Bed Mobility Overal bed mobility: Needs Assistance Bed Mobility: Supine to Sit     Supine to sit: Supervision, HOB elevated     General bed mobility comments: increased time, PT assisting with lines/leads    Transfers Overall transfer level: Needs assistance Equipment used: 1 person hand held assist Transfers: Sit to/from Stand Sit to Stand: Min assist           General transfer comment: light rise and steady assist from low surface, STS x2 from EOB once with PT HHA and once with RW.    Ambulation/Gait Ambulation/Gait assistance: Min guard Gait Distance (Feet): 250 Feet Assistive device: Rolling walker (2 wheels) Gait Pattern/deviations: Step-through pattern, Decreased stride length, Trunk flexed Gait velocity: decr     General Gait Details: cues for upright posture, slowed and guarded gait improving with further distance  Stairs            Wheelchair Mobility    Modified Rankin (Stroke Patients Only) Modified Rankin (Stroke Patients Only) Pre-Morbid Rankin Score: No symptoms Modified Rankin: Moderately severe disability     Balance Overall balance assessment: Mild deficits observed, not formally tested                                           Pertinent Vitals/Pain Pain Assessment Pain Assessment: Faces Faces Pain Scale: Hurts little more Pain Location: scrotum during scooting Pain Descriptors / Indicators: Sore, Discomfort, Guarding Pain Intervention(s): Limited activity within patient's tolerance, Monitored during session    Home Living Family/patient expects to be discharged to::  Private residence Living Arrangements: Spouse/significant other Available Help at Discharge: Family Type of Home: House Home Access: Stairs to enter   Secretary/administrator of Steps: 1   Home Layout: One level Home Equipment: Shower seat      Prior Function Prior Level of Function :  Independent/Modified Independent                     Hand Dominance   Dominant Hand: Right    Extremity/Trunk Assessment   Upper Extremity Assessment Upper Extremity Assessment: Defer to OT evaluation    Lower Extremity Assessment Lower Extremity Assessment: Generalized weakness (at least 4/5 bilat via MMT, but functional deficits R knee extension (history of buckling, antalgic gait))    Cervical / Trunk Assessment Cervical / Trunk Assessment: Normal  Communication   Communication: HOH  Cognition Arousal/Alertness: Awake/alert Behavior During Therapy: WFL for tasks assessed/performed Overall Cognitive Status: Within Functional Limits for tasks assessed                                          General Comments      Exercises     Assessment/Plan    PT Assessment Patient needs continued PT services  PT Problem List Decreased strength;Decreased mobility;Decreased activity tolerance;Decreased balance;Pain       PT Treatment Interventions DME instruction;Therapeutic activities;Gait training;Therapeutic exercise;Patient/family education;Balance training;Stair training;Functional mobility training;Neuromuscular re-education    PT Goals (Current goals can be found in the Care Plan section)  Acute Rehab PT Goals Patient Stated Goal: home PT Goal Formulation: With patient Time For Goal Achievement: 02/08/22 Potential to Achieve Goals: Good    Frequency Min 4X/week     Co-evaluation               AM-PAC PT "6 Clicks" Mobility  Outcome Measure Help needed turning from your back to your side while in a flat bed without using bedrails?: A Little Help needed moving from lying on your back to sitting on the side of a flat bed without using bedrails?: A Little Help needed moving to and from a bed to a chair (including a wheelchair)?: A Little Help needed standing up from a chair using your arms (e.g., wheelchair or bedside chair)?: A  Little Help needed to walk in hospital room?: A Little Help needed climbing 3-5 steps with a railing? : A Little 6 Click Score: 18    End of Session   Activity Tolerance: Patient tolerated treatment well Patient left: in chair;with call bell/phone within reach;with chair alarm set;with family/visitor present Nurse Communication: Mobility status PT Visit Diagnosis: Other abnormalities of gait and mobility (R26.89)    Time: 4098-1191 PT Time Calculation (min) (ACUTE ONLY): 26 min   Charges:   PT Evaluation $PT Eval Low Complexity: 1 Low PT Treatments $Gait Training: 8-22 mins       [name], PT DPT Acute Rehabilitation Services Pager [phone]  Office [phone]   Tyrone Apple E Christain [place] 01/25/2022, 12:29 PM

## 2022-01-25 NOTE — Progress Notes (Addendum)
ANTICOAGULATION CONSULT NOTE  Pharmacy Consult:  Heparin / Coumadin Indication: History of mechanical valve  No Known Allergies  Patient Measurements: Weight: 101.8 kg (224 lb 6.9 oz) Heparin Dosing Weight: 93 kg  Vital Signs: Temp: 98.5 F (36.9 C) (12/27 0800) Temp Source: Axillary (12/27 0800) BP: 135/60 (12/27 0900) Pulse Rate: 81 (12/27 0915)  Labs: Recent Labs    01/24/22 0835 01/24/22 0837 01/24/22 2200 01/25/22 0458 01/25/22 0838  HGB 14.0 14.3  --  13.2  --   HCT 42.4 42.0  --  37.3*  --   PLT 205  --   --  202  --   APTT 30  --   --   --   --   LABPROT 24.1*  --   --   --  24.8*  INR 2.2*  --   --   --  2.3*  HEPARINUNFRC  --   --  <0.10*  --  0.25*  CREATININE 1.35* 1.20  --  1.32*  --      CrCl cannot be calculated (Unknown ideal weight.).   Assessment: 15 YOM admitted as a code stroke with near total occlusion of M1 segment, now s/p revascularization by IR.  Patient has a history of a St. Jude mechanical AVR on Coumadin PTA.  He also had a CVA in May 2023 and was in Afib at that time.  INR sub-therapeutic on admit at 2.2.  Pharmacy consulted to bridge with IV heparin.   Heparin level trending up but remains sub-therapeutic at 0.25 units/mL on 1300 units/hr.  INR up to 2.3.  No issue with heparin infusion nor bleeding.  Goal of Therapy:  Heparin level 0.3-0.5 units/ml Monitor platelets by anticoagulation protocol: Yes   Plan:  Increase heparin infusion to 1400 units/hr - no bolus post IR and with INR of 2.3 Check 8 hr heparin level Daily heparin level and CBC Restart Coumadin 12/28 per Neuro  Tremeka Helbling D. Laney Potash, PharmD, BCPS, BCCCP 01/25/2022, 10:21 AM  ============================  Addendum: Elevated TG could interfere with assay and cause heparin level to be falsely low Repeat TG with next lab draw and add aPTT to guide heparin dosing  Alfredo Collymore D. Laney Potash, PharmD, BCPS, BCCCP 01/25/2022, 1:34 PM

## 2022-01-25 NOTE — Progress Notes (Signed)
Patient states he cannot tolerate our CPAP mask.  His wife will possible bring home machine/mask 12/28.  RT will continue to monitor.

## 2022-01-25 NOTE — Progress Notes (Signed)
ANTICOAGULATION CONSULT NOTE - Follow Up Consult  Pharmacy Consult for heparin Indication:  AVR/Afib in setting of CVA now s/p IR thrombectomy  Labs: Recent Labs    01/24/22 0835 01/24/22 0837 01/24/22 2200  HGB 14.0 14.3  --   HCT 42.4 42.0  --   PLT 205  --   --   APTT 30  --   --   LABPROT 24.1*  --   --   INR 2.2*  --   --   HEPARINUNFRC  --   --  <0.10*  CREATININE 1.35* 1.20  --     Assessment: 76yo male subtherapeutic on heparin with initial dosing for low INR; no infusion issues or signs of bleeding per RN.  Goal of Therapy:  Heparin level 0.3-0.5 units/ml   Plan:  Will increase heparin infusion cautiously to 1300 units/hr and check level in 8 hours.    Vernard Gambles, PharmD, BCPS  01/25/2022,12:01 AM

## 2022-01-25 NOTE — Progress Notes (Signed)
Spoke with MRI about getting patient down before shift change. Multiple STAT MRIs popped up before him. Patient's MRI is routine. Will pass this information along to day RN.

## 2022-01-25 NOTE — Evaluation (Signed)
Occupational Therapy Evaluation Patient Details Name: [name] MRN: [id number] DOB: 1945-04-26 Today's Date: 01/25/2022   History of Present Illness 76 yo male presents to [facility] on 12/26 with aphasia, R weakness, L gaze. CT head showed no acute bleed, and CTA showed left M1 occlusion. S/p L common carotid arteriogram, revascularization of occluded L MCA M1 segment achieving aTICI 3 revascularization on 12/26. PMH includes DM, CABG, mechanical heart valve on coumadin, prior CVA.   Clinical Impression   PTA patient reports independent and driving. Admitted for above and presents with problem list below.  Patient oriented and following commands, limited by [facility] but noted some decreased recall and problem solving during ADL tasks.  He requires up to min guard for ADLs, transfers and mobility using IV pole in room.  Pt does demonstrate L thumb/hand edema, painful during opposition testing; R UE WFL.  Based on performance today, believe he will best benefit from continued OT services acutely to optimize independence and safety but anticipate no further needs after dc home. Will follow.      Recommendations for follow up therapy are one component of a multi-disciplinary discharge planning process, led by the attending physician.  Recommendations may be updated based on patient status, additional functional criteria and insurance authorization.   Follow Up Recommendations  No OT follow up     Assistance Recommended at Discharge Intermittent Supervision/Assistance  Patient can return home with the following A little help with walking and/or transfers;A little help with bathing/dressing/bathroom;Help with stairs or ramp for entrance;Assist for transportation;Assistance with cooking/housework    Functional Status Assessment  Patient has had a recent decline in their functional status and demonstrates the ability to make significant improvements in function in a reasonable and predictable amount of  time.  Equipment Recommendations  None recommended by OT    Recommendations for Other Services       Precautions / Restrictions Precautions Precautions: Fall Restrictions Weight Bearing Restrictions: No      Mobility Bed Mobility               General bed mobility comments: OOB in bathroom with RN upon entry    Transfers Overall transfer level: Needs assistance Equipment used: 1 person hand held assist Transfers: Sit to/from Stand Sit to Stand: Min guard           General transfer comment: for safety/balance      Balance Overall balance assessment: Mild deficits observed, not formally tested                                         ADL either performed or assessed with clinical judgement   ADL Overall ADL's : Needs assistance/impaired     Grooming: Min guard;Standing           Upper Body Dressing : Set up;Sitting   Lower Body Dressing: Min guard;Sit to/from stand   Toilet Transfer: Min guard;Ambulation   Toileting- Clothing Manipulation and Hygiene: Min guard;Sit to/from stand       Functional mobility during ADLs: Min guard General ADL Comments: using IV pole for mobility     Vision Baseline Vision/History: 1 Wears glasses Ability to See in Adequate Light: 0 Adequate Patient Visual Report: No change from baseline Vision Assessment?: No apparent visual deficits Additional Comments: reports baseline     Perception     Praxis      Pertinent Vitals/Pain Pain  Assessment Pain Assessment: Faces Faces Pain Scale: Hurts a little bit Pain Location: with urination Pain Descriptors / Indicators: Burning, Discomfort Pain Intervention(s): Limited activity within patient's tolerance, Monitored during session, Repositioned     Hand Dominance Right   Extremity/Trunk Assessment Upper Extremity Assessment Upper Extremity Assessment: LUE deficits/detail;Generalized weakness LUE Deficits / Details: L hand edema, pt  demonstrating difficulty with opposition and FMC LUE Sensation: decreased light touch LUE Coordination: decreased fine motor   Lower Extremity Assessment Lower Extremity Assessment: Defer to PT evaluation   Cervical / Trunk Assessment Cervical / Trunk Assessment: Normal   Communication Communication Communication: HOH   Cognition Arousal/Alertness: Awake/alert Behavior During Therapy: WFL for tasks assessed/performed Overall Cognitive Status: No family/caregiver present to determine baseline cognitive functioning Area of Impairment: Memory, Problem solving                     Memory: Decreased short-term memory       Problem Solving: Slow processing, Requires verbal cues General Comments: pt with decreased recall and problem solving, no family present but pt reports baseline since prior CVA     General Comments  VSS    Exercises     Shoulder Instructions      Home Living Family/patient expects to be discharged to:: Private residence Living Arrangements: Spouse/significant other Available Help at Discharge: Family Type of Home: House Home Access: Stairs to enter Secretary/administrator of Steps: 1   Home Layout: One level     Bathroom Shower/Tub: Tub/shower unit;Walk-in shower   Bathroom Toilet: Standard     Home Equipment: Shower seat          Prior Functioning/Environment Prior Level of Function : Independent/Modified Independent;Driving                        OT Problem List: Decreased strength;Decreased activity tolerance;Impaired balance (sitting and/or standing);Decreased cognition;Decreased safety awareness;Decreased knowledge of use of DME or AE;Decreased knowledge of precautions      OT Treatment/Interventions: Self-care/ADL training;DME and/or AE instruction;Therapeutic activities;Cognitive remediation/compensation;Patient/family education;Balance training    OT Goals(Current goals can be found in the care plan section) Acute  Rehab OT Goals Patient Stated Goal: home OT Goal Formulation: With patient Time For Goal Achievement: 02/08/22 Potential to Achieve Goals: Good  OT Frequency: Min 2X/week    Co-evaluation              AM-PAC OT "6 Clicks" Daily Activity     Outcome Measure Help from another person eating meals?: None Help from another person taking care of personal grooming?: A Little Help from another person toileting, which includes using toliet, bedpan, or urinal?: A Little Help from another person bathing (including washing, rinsing, drying)?: A Little Help from another person to put on and taking off regular upper body clothing?: A Little Help from another person to put on and taking off regular lower body clothing?: A Little 6 Click Score: 19   End of Session Nurse Communication: Mobility status  Activity Tolerance: Patient tolerated treatment well Patient left: in chair;with call bell/phone within reach;with chair alarm set  OT Visit Diagnosis: Other abnormalities of gait and mobility (R26.89);Muscle weakness (generalized) (M62.81)                Time: 9562-1308 OT Time Calculation (min): 22 min Charges:  OT General Charges $OT Visit: 1 Visit OT Evaluation $OT Eval Moderate Complexity: 1 Mod  Barry Brunner, OT Acute Rehabilitation [employer] [phone]   [place]  S Kobyn Kray 01/25/2022, 1:32 PM

## 2022-01-25 NOTE — Progress Notes (Signed)
ANTICOAGULATION CONSULT NOTE  Pharmacy Consult:  Heparin>>Lovenox / Coumadin Indication: History of mechanical valve  No Known Allergies  Patient Measurements: Weight: 101.8 kg (224 lb 6.9 oz) Heparin Dosing Weight: 93 kg  Vital Signs: Temp: 99 F (37.2 C) (12/27 1638) Temp Source: Oral (12/27 1638) BP: 156/75 (12/27 1638) Pulse Rate: 67 (12/27 1638)  Labs: Recent Labs    01/24/22 0835 01/24/22 0837 01/24/22 2200 01/25/22 0458 01/25/22 0838  HGB 14.0 14.3  --  13.2  --   HCT 42.4 42.0  --  37.3*  --   PLT 205  --   --  202  --   APTT 30  --   --   --   --   LABPROT 24.1*  --   --   --  24.8*  INR 2.2*  --   --   --  2.3*  HEPARINUNFRC  --   --  <0.10*  --  0.25*  CREATININE 1.35* 1.20  --  1.32*  --      CrCl cannot be calculated (Unknown ideal weight.).   Assessment: 69 YOM admitted as a code stroke with near total occlusion of M1 segment, now s/p revascularization by IR.  Patient has a history of a St. Jude mechanical AVR on Coumadin PTA.  He also had a CVA in May 2023 and was in Afib at that time.  INR sub-therapeutic on admit at 2.2.  Pharmacy consulted to bridge with IV heparin.   Order to change heparin to Lovenox bridge with coumadin tonight until INR is in goal. INR 2.3 today.   Goal of Therapy:  INR 2.5-3.5 Anti-Xa: 0.6-1 Monitor platelets by anticoagulation protocol: Yes   Plan:  Dc heparin Lovenox 100mg  SQ BID Coumadin 7.5mg  PO x1 Daily INR  , PharmD, [place], AAHIVP, CPP Infectious Disease Pharmacist 01/25/2022 6:19 PM

## 2022-01-25 NOTE — Progress Notes (Signed)
Referring Physician(s): Amie Portland, MD/Code stroke  Supervising Physician: Luanne Bras  Patient Status:  [facility] - In-pt  Chief Complaint: Left MCA occlusion s/p revascularization 12/26 with Dr. Estanislado Pandy  Subjective:  Patient sitting up in bed eating, states he's having an omelette and is not done yet and does not want anyone to touch his tray. He asks if we are taking him down for his MRI, reviewed that we are NIR team and performed his stroke intervention. Patient seems surprised to hear he had a procedure done but is aware he's here because he had a stroke. He states he had a stroke in May as well and "hasn't been right since then." He reports tremors which seem worse today but have been present for "a long time." He takes warfarin at home due to having a mechanical heart valve, doesn't think he missed any doses prior to having stroke. After discussing all this patient asks if we are PT because he wants to get out of bed and have a bowel movement. Again reviewed that we are NIR team who performed intervention to which he is again surprised about. He is asking what caused the stroke and when he can go home.  Allergies: Patient has no known allergies.  Medications: Prior to Admission medications   Medication Sig Start Date End Date Taking? Authorizing Provider  amLODipine (NORVASC) 5 MG tablet Take 5 mg by mouth daily. 12/02/21  Yes [provider]  clopidogrel (PLAVIX) 75 MG tablet Take 75 mg by mouth daily. 12/02/21  Yes [provider]  colchicine 0.6 MG tablet Take 0.6 mg by mouth 2 (two) times daily as needed (for gout). 10/19/21  Yes [provider]  fenofibrate (TRICOR) 145 MG tablet Take 145 mg by mouth daily. 01/10/22  Yes [provider]  furosemide (LASIX) 20 MG tablet Take 20 mg by mouth daily. 01/10/22  Yes [provider]  Insulin Glargine (BASAGLAR KWIKPEN) 100 UNIT/ML Inject 22 Units into the skin at bedtime. 09/26/21  Yes  [provider]  isosorbide mononitrate (IMDUR) 30 MG 24 hr tablet Take 30 mg by mouth daily. 12/02/21  Yes [provider]  metoprolol succinate (TOPROL-XL) 25 MG 24 hr tablet Take 25 mg by mouth daily. 12/28/21  Yes [provider]  NOVOLOG FLEXPEN 100 UNIT/ML FlexPen Inject 14 Units into the skin 3 (three) times daily with meals. 01/02/22  Yes [provider]  rosuvastatin (CRESTOR) 40 MG tablet Take 40 mg by mouth daily. 11/30/21  Yes [provider]  warfarin (COUMADIN) 7.5 MG tablet Take 3.75-7.5 mg by mouth daily. Take 3.75 mg  on Monday wed and Fridays and then take 7.5 rest of days per patient 12/31/21  Yes [provider]     Vital Signs: BP 135/60   Pulse 81   Temp 98.5 F (36.9 C) (Axillary)   Resp (!) 25   Wt 224 lb 6.9 oz (101.8 kg)   SpO2 94%   Physical Exam Vitals and nursing note reviewed.  Cardiovascular:     Rate and Rhythm: Normal rate.     Comments: (+) Right CFA puncture site mildly tender to palpation, non-pulsatile, soft.  Pulmonary:     Effort: Pulmonary effort is normal.  Abdominal:     Palpations: Abdomen is soft.  Skin:    General: Skin is warm and dry.  Neurological:     Mental Status: He is alert.   Alert, awake, and oriented x 3 PERRL bilaterally EOMs without nystagmus  or subjective diplopia. Visual fields grossly in tact No obvious facial asymmetry. Tongue midline Spontaneously moving all 4 extremities Negative pronator drift. Fine motor and coordination in tact Gait not assessed Distal pulses palpable on RLE  Imaging: ECHOCARDIOGRAM COMPLETE  Result Date: 01/24/2022    ECHOCARDIOGRAM REPORT   Patient Name:   AMIR FICK Date of Exam: 01/24/2022 Medical Rec #:  [id number]      Height: Accession #:    [id number]     Weight:       224.4 lb Date of Birth:  18-Sep-1945      BSA:          2.181 m Patient Age:    76 years       BP:           111/61 mmHg Patient Gender: M              HR:            72 bpm. Exam Location:  Inpatient Procedure: 2D Echo, Cardiac Doppler, Color Doppler and Intracardiac            Opacification Agent Indications:   Stroke I63.9  History:       Patient has no prior history of Echocardiogram examinations.                Stroke.  Sonographer:   Bernadene Person RDCS Referring      [id number] ASHISH ARORA Phys:  Sonographer Comments: Image acquisition challenging due to respiratory motion. IMPRESSIONS  1. Left ventricular ejection fraction, by estimation, is 50 to 55%. The left ventricle has low normal function. The left ventricle has no regional wall motion abnormalities. There is mild asymmetric left ventricular hypertrophy of the basal and septal segments. Left ventricular diastolic parameters were normal.  2. Right ventricular systolic function is normal. The right ventricular size is normal.  3. Left atrial size was moderately dilated.  4. The mitral valve is abnormal. Trivial mitral valve regurgitation. No evidence of mitral stenosis.  5. Mean gradient across AV 8 peak 11 mmHg AVA 2.5 cm 2 no stenosis Mechanical AVR not well seen and no records of valve type . The aortic valve has been repaired/replaced. There is moderate calcification of the aortic valve. There is moderate thickening  of the aortic valve. Aortic valve regurgitation is not visualized. Aortic valve sclerosis/calcification is present, without any evidence of aortic stenosis.  6. Aortic dilatation noted. There is severe dilatation of the ascending aorta, measuring 46 mm.  7. The inferior vena cava is normal in size with greater than 50% respiratory variability, suggesting right atrial pressure of 3 mmHg. FINDINGS  Left Ventricle: Septal hypokinesis small area of apical dyskinesis /aneurysm no mural thrombus. Left ventricular ejection fraction, by estimation, is 50 to 55%. The left ventricle has low normal function. The left ventricle has no regional wall motion abnormalities. Definity contrast agent was given IV to  delineate the left ventricular endocardial borders. The left ventricular internal cavity size was normal in size. There is mild asymmetric left ventricular hypertrophy of the basal and septal segments. Left ventricular diastolic parameters were normal. Right Ventricle: The right ventricular size is normal. No increase in right ventricular wall thickness. Right ventricular systolic function is normal. Left Atrium: Left atrial size was moderately dilated. Right Atrium: Right atrial size was normal in size. Pericardium: There is no evidence of pericardial effusion. Mitral Valve: The mitral valve is abnormal. There is moderate thickening of the mitral valve leaflet(s). There is  moderate calcification of the mitral valve leaflet(s). Trivial mitral valve regurgitation. No evidence of mitral valve stenosis. Tricuspid Valve: The tricuspid valve is normal in structure. Tricuspid valve regurgitation is mild . No evidence of tricuspid stenosis. Aortic Valve: Mean gradient across AV 8 peak 11 mmHg AVA 2.5 cm 2 no stenosis Mechanical AVR not well seen and no records of valve type. The aortic valve has been repaired/replaced. There is moderate calcification of the aortic valve. There is moderate thickening of the aortic valve. Aortic valve regurgitation is not visualized. Aortic valve sclerosis/calcification is present, without any evidence of aortic stenosis. Aortic valve mean gradient measures 8.7 mmHg. Aortic valve peak gradient measures 13.5  mmHg. Aortic valve area, by VTI measures 2.78 cm. Pulmonic Valve: The pulmonic valve was normal in structure. Pulmonic valve regurgitation is not visualized. No evidence of pulmonic stenosis. Aorta: Aortic dilatation noted. There is severe dilatation of the ascending aorta, measuring 46 mm. Venous: The inferior vena cava is normal in size with greater than 50% respiratory variability, suggesting right atrial pressure of 3 mmHg. IAS/Shunts: No atrial level shunt detected by color flow  Doppler.  LEFT VENTRICLE PLAX 2D LVIDd:         4.10 cm     Diastology LVIDs:         2.90 cm     LV e' medial:    5.26 cm/s LV PW:         1.10 cm     LV E/e' medial:  27.9 LV IVS:        1.10 cm     LV e' lateral:   10.70 cm/s LVOT diam:     2.10 cm     LV E/e' lateral: 13.7 LV SV:         137 LV SV Index:   63 LVOT Area:     3.46 cm  LV Volumes (MOD) LV vol d, MOD A2C: 53.1 ml LV vol d, MOD A4C: 88.3 ml LV vol s, MOD A2C: 18.2 ml LV vol s, MOD A4C: 28.8 ml LV SV MOD A2C:     34.9 ml LV SV MOD A4C:     88.3 ml LV SV MOD BP:      48.0 ml RIGHT VENTRICLE RV S prime:     10.50 cm/s TAPSE (M-mode): 2.2 cm LEFT ATRIUM             Index        RIGHT ATRIUM           Index LA diam:        4.80 cm 2.20 cm/m   RA Area:     23.30 cm LA Vol (A2C):   73.8 ml 33.84 ml/m  RA Volume:   64.80 ml  29.72 ml/m LA Vol (A4C):   79.7 ml 36.55 ml/m LA Biplane Vol: 75.4 ml 34.58 ml/m  AORTIC VALVE AV Area (Vmax):    2.71 cm AV Area (Vmean):   2.47 cm AV Area (VTI):     2.78 cm AV Vmax:           184.00 cm/s AV Vmean:          140.000 cm/s AV VTI:            0.494 m AV Peak Grad:      13.5 mmHg AV Mean Grad:      8.7 mmHg LVOT Vmax:         144.00 cm/s LVOT Vmean:  99.800 cm/s LVOT VTI:          0.396 m LVOT/AV VTI ratio: 0.80  AORTA Ao Root diam: 4.10 cm Ao Asc diam:  4.60 cm MITRAL VALVE                TRICUSPID VALVE MV Area (PHT): 2.20 cm     TR Peak grad:   32.3 mmHg MV Decel Time: 345 msec     TR Vmax:        284.00 cm/s MV E velocity: 147.00 cm/s MV A velocity: 116.00 cm/s  SHUNTS MV E/A ratio:  1.27         Systemic VTI:  0.40 m                             Systemic Diam: 2.10 cm [name] MD Electronically signed by [name] MD Signature Date/Time: 01/24/2022/6:14:46 PM    Final    CT ANGIO HEAD NECK W WO CM (CODE STROKE)  Result Date: 01/24/2022 CLINICAL DATA:  Acute right-sided weakness EXAM: CT ANGIOGRAPHY HEAD AND NECK TECHNIQUE: Multidetector CT imaging of the head and neck was performed using the  standard protocol during bolus administration of intravenous contrast. Multiplanar CT image reconstructions and MIPs were obtained to evaluate the vascular anatomy. Carotid stenosis measurements (when applicable) are obtained utilizing NASCET criteria, using the distal internal carotid diameter as the denominator. RADIATION DOSE REDUCTION: This exam was performed according to the departmental dose-optimization program which includes automated exposure control, adjustment of the mA and/or kV according to patient size and/or use of iterative reconstruction technique. CONTRAST:  30m OMNIPAQUE IOHEXOL 350 MG/ML SOLN COMPARISON:  Head CT immediately prior FINDINGS: CTA NECK FINDINGS Aortic arch: Aortic atherosclerosis. Branching pattern is normal without origin stenosis. Right carotid system: Common carotid artery shows minimal scattered plaque but is widely patent to the bifurcation. Calcified plaque at the carotid bifurcation and proximal ICA. Minimal diameter is 3.6 mm. Compared to a more distal cervical ICA diameter of 4.8 mm, this indicates a 25% stenosis. Widely patent beyond that to the skull base. Left carotid system: Common carotid artery shows scattered plaque but is widely patent to the bifurcation. Calcified plaque at the bifurcation and ICA bulb but without stenosis compared to the more distal cervical ICA diameter. Vertebral arteries: Right vertebral artery origin is widely patent. 30% stenosis of the left vertebral artery origin. Both vessels widely patent beyond that through the cervical region to the foramen magnum. Skeleton: Ordinary cervical spondylosis and facet arthritis. Other neck: No mass or lymphadenopathy. Upper chest: Mild apical scarring. Review of the MIP images confirms the above findings CTA HEAD FINDINGS Anterior circulation: Right internal carotid artery shows atherosclerotic calcification throughout the carotid siphon region with stenosis estimated at 50%. There is abrupt acute in  near-total occlusion of the left M1 segment 8 mm from the origin. Small amount of flow seen in the more distal MCA branches, presumably due to leptomeningeal collaterals. Left anterior cerebral artery is widely patent. Left PCA takes fetal origin from the anterior circulation. Right internal carotid artery is patent through the skull base and siphon region. There is extensive calcified plaque throughout the siphon region with serial stenoses greater than 70%. The right middle cerebral artery is patent. Right A1 segment is hypoplastic. Right PCA takes fetal origin from the anterior circulation. Posterior circulation: Both vertebral arteries are patent through the foramen magnum to the basilar. Patent posteroinferior cerebellar arteries, anterior inferior  cerebellar arteries and superior cerebellar arteries. Both posterior cerebral arteries take fetal circulation origin as noted above. Venous sinuses: Patent and normal. Anatomic variants: None other significant. Review of the MIP images confirms the above findings IMPRESSION: 1. Acute and near-total occlusion of the left M1 segment 8 mm from the origin. Small amount of flow in the more distal MCA branches, presumably due to leptomeningeal collaterals. 2. Atherosclerotic disease at both carotid bifurcations. 25% stenosis of the proximal ICA on the right. No measurable stenosis on the left. 3. Atherosclerotic disease in both carotid siphon regions with serial stenoses estimated at 70% on the right and 50% on the left. 4. 30% stenosis of the left vertebral artery origin. 5. Both posterior cerebral arteries take fetal origin from the anterior circulation. 6. Aortic atherosclerosis. 7. Findings discussed with Dr. Rory Percy and communicated by Amion at 0854 hours. Aortic Atherosclerosis (ICD10-I70.0). Electronically Signed   By: Nelson Chimes M.D.   On: 01/24/2022 09:14   CT HEAD CODE STROKE WO CONTRAST  Result Date: 01/24/2022 CLINICAL DATA:  Code stroke. Neuro deficit,  acute, stroke suspected. Gait disturbance. Right-sided weakness. EXAM: CT HEAD WITHOUT CONTRAST TECHNIQUE: Contiguous axial images were obtained from the base of the skull through the vertex without intravenous contrast. RADIATION DOSE REDUCTION: This exam was performed according to the departmental dose-optimization program which includes automated exposure control, adjustment of the mA and/or kV according to patient size and/or use of iterative reconstruction technique. COMPARISON:  06/25/2021 FINDINGS: Brain: Generalized volume loss. Old small vessel cerebellar infarctions. Old infarction in the left frontal operculum with volume loss and gliosis. No sign of acute infarction based on today's scan. No hemorrhage, hydrocephalus or extra-axial collection. Vascular: There is atherosclerotic calcification of the major vessels at the base of the brain. Skull: Normal Sinuses/Orbits: Mild inflammatory changes of the left maxillary sinus. Orbits negative. Other: None ASPECTS ([place] Stroke Program Early CT Score) - Ganglionic level infarction (caudate, lentiform nuclei, internal capsule, insula, M1-M3 cortex): 7, allowing for the old infarction. - Supraganglionic infarction (M4-M6 cortex): 3, allowing for the old infarction Total score (0-10 with 10 being normal): 10, allowing for the old infarction. IMPRESSION: 1. No acute CT finding. Old small-vessel cerebellar infarctions. Old infarction in the left frontal operculum. 2. Aspects is 10, allowing for the old infarction. These results were communicated to Dr. Rory Percy at 8:51 am on 01/24/2022 by text page via the [facility] messaging system. Electronically Signed   By: Nelson Chimes M.D.   On: 01/24/2022 08:53    Labs:  CBC: Recent Labs    01/24/22 0835 01/24/22 0837 01/25/22 0458  WBC 7.7  --  10.5  HGB 14.0 14.3 13.2  HCT 42.4 42.0 37.3*  PLT 205  --  202    COAGS: Recent Labs    01/24/22 0835 01/25/22 0838  INR 2.2* 2.3*  APTT 30  --     BMP: Recent  Labs    01/24/22 0835 01/24/22 0837 01/25/22 0458  NA 137 140 136  K 3.5 3.6 3.6  CL 105 106 103  CO2 24  --  21*  GLUCOSE 169* 168* 167*  BUN 32* 31* 26*  CALCIUM 9.0  --  8.7*  CREATININE 1.35* 1.20 1.32*  GFRNONAA 54*  --  56*    LIVER FUNCTION TESTS: Recent Labs    01/24/22 0835  BILITOT 0.4  AST 28  ALT 21  ALKPHOS 42  PROT 7.1  ALBUMIN 3.9    Assessment and Plan:  76 y/o M  admitted 01/24/22 as a code stroke, found to have occluded left MCA M1 segment. He underwent successful endovascular revascularization in NIR on the same date. He did not require stent placement.  Patient seen today for follow up, he is A&Ox3 but has trouble remembering who we are despite several reminders. He is able to follow commands appropriately and has no significant residual weakness on exam today. He is planned for follow up MRI this morning. Right CFA puncture site unremarkable.   Currently on heparin gtt with plans to restart warfarin likely 12/28 per neurology.   No further NIR needs at this time, patient does not require regular follow up with Dr. Estanislado Pandy. Further plans per neurology/primary team.  NIR remains available as needed, please call with questions or concerns.  Electronically Signed: Joaquim Nam, PA-C 01/25/2022, 11:08 AM   I spent a total of 15 Minutes at the the patient's bedside AND on the patient's hospital floor or unit, greater than 50% of which was counseling/coordinating care for left MCA thrombectomy.

## 2022-01-26 DIAGNOSIS — R339 Retention of urine, unspecified: Secondary | ICD-10-CM | POA: Insufficient documentation

## 2022-01-26 DIAGNOSIS — R338 Other retention of urine: Secondary | ICD-10-CM

## 2022-01-26 DIAGNOSIS — I639 Cerebral infarction, unspecified: Secondary | ICD-10-CM | POA: Diagnosis not present

## 2022-01-26 DIAGNOSIS — Z952 Presence of prosthetic heart valve: Secondary | ICD-10-CM | POA: Diagnosis not present

## 2022-01-26 HISTORY — DX: Retention of urine, unspecified: R33.9

## 2022-01-26 LAB — CBC
HCT: 38.8 % — ABNORMAL LOW (ref 39.0–52.0)
Hemoglobin: 13.5 g/dL (ref 13.0–17.0)
MCH: 30.1 pg (ref 26.0–34.0)
MCHC: 34.8 g/dL (ref 30.0–36.0)
MCV: 86.4 fL (ref 80.0–100.0)
Platelets: 183 10*3/uL (ref 150–400)
RBC: 4.49 MIL/uL (ref 4.22–5.81)
RDW: 13.7 % (ref 11.5–15.5)
WBC: 8.9 10*3/uL (ref 4.0–10.5)
nRBC: 0 % (ref 0.0–0.2)

## 2022-01-26 LAB — BASIC METABOLIC PANEL
Anion gap: 10 (ref 5–15)
BUN: 22 mg/dL (ref 8–23)
CO2: 23 mmol/L (ref 22–32)
Calcium: 8.7 mg/dL — ABNORMAL LOW (ref 8.9–10.3)
Chloride: 103 mmol/L (ref 98–111)
Creatinine, Ser: 1.21 mg/dL (ref 0.61–1.24)
GFR, Estimated: >60 mL/min (ref >60)
Glucose, Bld: 162 mg/dL — ABNORMAL HIGH (ref 70–99)
Potassium: 3.4 mmol/L — ABNORMAL LOW (ref 3.5–5.1)
Sodium: 136 mmol/L (ref 135–145)

## 2022-01-26 LAB — LDL CHOLESTEROL, DIRECT: Direct LDL: 53 mg/dL (ref 0–99)

## 2022-01-26 LAB — PROTIME-INR
INR: 1.7 — ABNORMAL HIGH (ref 0.8–1.2)
Prothrombin Time: 19.4 seconds — ABNORMAL HIGH (ref 11.4–15.2)

## 2022-01-26 LAB — GLUCOSE, CAPILLARY
Glucose-Capillary: 257 mg/dL — ABNORMAL HIGH (ref 70–99)
Glucose-Capillary: 180 mg/dL — ABNORMAL HIGH (ref 70–99)
Glucose-Capillary: 262 mg/dL — ABNORMAL HIGH (ref 70–99)
Glucose-Capillary: 199 mg/dL — ABNORMAL HIGH (ref 70–99)

## 2022-01-26 MED ORDER — INSULIN GLARGINE-YFGN 100 UNIT/ML ~~LOC~~ SOLN
22.0000 [IU] | Freq: Every day | SUBCUTANEOUS | Status: DC
  Administered 2022-01-26 – 2022-01-27 (×2): 22 [IU] via SUBCUTANEOUS
  Filled 2022-01-26 (×2): qty 0.22

## 2022-01-26 MED ORDER — POTASSIUM CHLORIDE CRYS ER 20 MEQ PO TBCR
40.0000 meq | EXTENDED_RELEASE_TABLET | Freq: Once | ORAL | Status: AC
  Administered 2022-01-26: 40 meq via ORAL
  Filled 2022-01-26: qty 2

## 2022-01-26 MED ORDER — WARFARIN SODIUM 7.5 MG PO TABS
7.5000 mg | ORAL_TABLET | Freq: Once | ORAL | Status: AC
  Administered 2022-01-26: 7.5 mg via ORAL
  Filled 2022-01-26: qty 1

## 2022-01-26 NOTE — Progress Notes (Signed)
ANTICOAGULATION CONSULT NOTE  Pharmacy Consult:  Lovenox / Coumadin Indication: History of mechanical AVR, CVA, Afib  No Known Allergies  Patient Measurements: Height: 5\' 9"  (175.3 cm) Weight: 101.8 kg (224 lb 6.9 oz) IBW/kg (Calculated) : 70.7 Heparin Dosing Weight: 93 kg  Vital Signs: Temp: 98.7 F (37.1 C) (12/28 0400) Temp Source: Oral (12/28 0400) BP: 149/75 (12/28 0840) Pulse Rate: 62 (12/28 0400)  Labs: Recent Labs    01/24/22 0835 01/24/22 0837 01/24/22 2200 01/25/22 0458 01/25/22 0838 01/26/22 0305  HGB 14.0 14.3  --  13.2  --  13.5  HCT 42.4 42.0  --  37.3*  --  38.8*  PLT 205  --   --  202  --  183  APTT 30  --   --   --   --   --   LABPROT 24.1*  --   --   --  24.8* 19.4*  INR 2.2*  --   --   --  2.3* 1.7*  HEPARINUNFRC  --   --  <0.10*  --  0.25*  --   CREATININE 1.35* 1.20  --  1.32*  --  1.21     Estimated Creatinine Clearance: 61 mL/min (by C-G formula based on SCr of 1.21 mg/dL).   Assessment: 76 YOM admitted as a code stroke with near total occlusion of M1 segment, now s/p revascularization by IR.  Patient has a history of a [employer] mechanical AVR on Coumadin PTA.  He also had a CVA in May 2023 and was in Afib at that time.  INR sub-therapeutic on admit at 2.2.  Pharmacy consulted to bridge with IV heparin.  Now transitioned to Lovenox and Coumadin since 12/27.  Renal function improving.  INR decreased to 1.7.  CBC stable; no bleeding reported.  Home Coumadin regimen: 7.5mg  daily except 3.75mg  on MWF   Goal of Therapy:  INR 2.5-3.5 Anti-Xa: 0.6-1 units/mL Monitor platelets by anticoagulation protocol: Yes   Plan:  Continue Lovenox 100mg  SQ Q12H Repeat Coumadin 7.5mg  PO today Daily PT / INR, CBC Q72H while on Lovenox   Tanis Burnley D. , PharmD, BCPS, BCCCP 01/26/2022, 9:57 AM

## 2022-01-26 NOTE — Inpatient Diabetes Management (Addendum)
Inpatient Diabetes Program Recommendations  AACE/ADA: New Consensus Statement on Inpatient Glycemic Control (2015)  Target Ranges:  Prepandial:   less than 140 mg/dL      Peak postprandial:   less than 180 mg/dL (1-2 hours)      Critically ill patients:  140 - 180 mg/dL   Lab Results  Component Value Date   GLUCAP 180 (H) 01/26/2022   HGBA1C 8.2 (H) 01/24/2022    Review of Glycemic Control  Latest Reference Range & Units 01/25/22 08:05 01/25/22 11:59 01/25/22 16:36 01/25/22 21:23 01/26/22 07:43 01/26/22 12:32  Glucose-Capillary 70 - 99 mg/dL 329 (H) 518 (H) 841 (H) 226 (H) 257 (H) 180 (H)  (H): Data is abnormally high  Diabetes history: DM2 Outpatient Diabetes medications: basaglar 22 units QD, Novolog 14 units TID Current orders for Inpatient glycemic control: Semglee 22 units QD (added today) & Novolog 0-15 units TID and HS  Inpatient Diabetes Program Recommendations:    Noted Semglee 22 units was added today.  Please consider 50% of home dose:  Semglee 11 units QD and titrate as needed.   Will continue to follow while inpatient.  Thank you, Dulce Sellar, MSN, CDCES Diabetes Coordinator Inpatient Diabetes Program [phone] (team pager from 8a-5p)

## 2022-01-26 NOTE — Progress Notes (Signed)
Physical Therapy Treatment Patient Details Name: [name] MRN: [id number] DOB: 11-30-1945 Today's Date: 01/26/2022   History of Present Illness 76 yo male presents to [facility] on 12/26 with aphasia, R weakness, L gaze. CT head showed no acute bleed, and CTA showed left M1 occlusion. S/p L common carotid arteriogram, revascularization of occluded L MCA M1 segment achieving aTICI 3 revascularization on 12/26. PMH includes DM, CABG, mechanical heart valve on coumadin, prior CVA.    PT Comments    Pt getting closer to baseline functioning, will need no follow up.  Emphasis on progression of gait stability, speed with balance challenge and safe negotiation of stairs.    Recommendations for follow up therapy are one component of a multi-disciplinary discharge planning process, led by the attending physician.  Recommendations may be updated based on patient status, additional functional criteria and insurance authorization.  Follow Up Recommendations  No PT follow up     Assistance Recommended at Discharge Set up Supervision/Assistance  Patient can return home with the following Assistance with cooking/housework;Assist for transportation   Equipment Recommendations  None recommended by PT    Recommendations for Other Services       Precautions / Restrictions Precautions Precautions: Fall (lower fall risk)     Mobility  Bed Mobility Overal bed mobility: Needs Assistance             General bed mobility comments: OOB on arrival    Transfers Overall transfer level: Needs assistance   Transfers: Sit to/from Stand Sit to Stand: Supervision           General transfer comment: for safety/balance    Ambulation/Gait Ambulation/Gait assistance: Supervision Gait Distance (Feet): 400 Feet Assistive device: None Gait Pattern/deviations: Step-through pattern, Decreased stride length   Gait velocity interpretation: >2.62 ft/sec, indicative of community ambulatory    General Gait Details: Pt wanted RW initially, but taken away by therapist.  Pt was generally steady though guarded.  Over whole session, pt asked to triple speed and was challenged in balance by abrupt directional/speed changes, turning and backing up, scanning without any overt deviation.   Stairs Stairs: Yes Stairs assistance: Supervision Stair Management: One rail Right, Forwards, Alternating pattern, Step to pattern Number of Stairs: 12 General stair comments: safe with rail   Wheelchair Mobility    Modified Rankin (Stroke Patients Only) Modified Rankin (Stroke Patients Only) Modified Rankin: Slight disability     Balance Overall balance assessment: Needs assistance   Sitting balance-Leahy Scale: Good       Standing balance-Leahy Scale: Fair                   Standardized Balance Assessment Standardized Balance Assessment : Berg Balance Test Berg Balance Test Sit to Stand: Able to stand  independently using hands Standing Unsupported: Able to stand safely 2 minutes Sitting with Back Unsupported but Feet Supported on Floor or Stool: Able to sit safely and securely 2 minutes Stand to Sit: Controls descent by using hands Transfers: Able to transfer safely, minor use of hands Standing Unsupported with Eyes Closed: Able to stand 10 seconds safely Standing Ubsupported with Feet Together: Able to place feet together independently and stand for 1 minute with supervision From Standing, Reach Forward with Outstretched Arm: Can reach confidently >25 cm (10") From Standing Position, Turn to Look Behind Over each Shoulder: Looks behind from both sides and weight shifts well Turn 360 Degrees: Able to turn 360 degrees safely but slowly Standing Unsupported, One Foot in Front: Able  to take small step independently and hold 30 seconds Standing on One Leg: Able to lift leg independently and hold 5-10 seconds        Cognition Arousal/Alertness: Awake/alert Behavior During  Therapy: WFL for tasks assessed/performed Overall Cognitive Status: Within Functional Limits for tasks assessed                                 General Comments: close to baseline, [facility]        Exercises      General Comments        Pertinent Vitals/Pain Pain Assessment Pain Assessment: Faces Faces Pain Scale: No hurt Pain Intervention(s): Monitored during session    Home Living     Available Help at Discharge: Family Type of Home: House                  Prior Function            PT Goals (current goals can now be found in the care plan section) Acute Rehab PT Goals PT Goal Formulation: With patient Time For Goal Achievement: 02/08/22 Potential to Achieve Goals: Good Progress towards PT goals: Progressing toward goals    Frequency    Min 4X/week      PT Plan Current plan remains appropriate    Co-evaluation              AM-PAC PT "6 Clicks" Mobility   Outcome Measure  Help needed turning from your back to your side while in a flat bed without using bedrails?: None Help needed moving from lying on your back to sitting on the side of a flat bed without using bedrails?: None Help needed moving to and from a bed to a chair (including a wheelchair)?: A Little Help needed standing up from a chair using your arms (e.g., wheelchair or bedside chair)?: A Little Help needed to walk in hospital room?: A Little Help needed climbing 3-5 steps with a railing? : A Little 6 Click Score: 20    End of Session   Activity Tolerance: Patient tolerated treatment well Patient left: in bed;with call bell/phone within reach;with family/visitor present Nurse Communication: Mobility status PT Visit Diagnosis: Other abnormalities of gait and mobility (R26.89);Other symptoms and signs involving the nervous system (R29.898)     Time: 1036-1100 PT Time Calculation (min) (ACUTE ONLY): 24 min  Charges:  $Gait Training: 8-22 mins $Therapeutic Activity:  8-22 mins                     01/26/2022  [name]., PT Acute Rehabilitation Services [phone]  (office)   [name] [name] 01/26/2022, 1:24 PM

## 2022-01-26 NOTE — Progress Notes (Signed)
Transition of Care [facility]) Screening Note   Patient Details  Name: [name] Date of Birth: 12/01/1945   Transition of Care [facility]) CM/SW Contact:    Mearl Latin, LCSW Phone Number: 01/26/2022, 10:23 AM    Transition of Care Department [facility]) has reviewed patient and no TOC needs have been identified at this time. We will continue to monitor patient advancement through interdisciplinary progression rounds. If new patient transition needs arise, please place a TOC consult.

## 2022-01-26 NOTE — Progress Notes (Addendum)
STROKE TEAM PROGRESS NOTE   INTERVAL HISTORY Patient in chair, daughter at bedside. NAD. Foley catheter inserted 12/27 for retention, flomax started. 2L output after catheter inserted. Plan to keep foley until tomorrow early AM and reevaluate urinary output/retention before discharge planning.  K: 3.4, replace and check in AM INR: 1.7 on coumadin/lovenox bridge. Goal is 2.5-3.5 Continue home medication including Plavix, Tricor and Crestor 40.  PT/OT no recommendation.  Vitals:   01/26/22 0400 01/26/22 0800 01/26/22 0840 01/26/22 1145  BP: (!) 147/70  (!) 149/75 135/64  Pulse: 62   65  Resp: 18  18 17   Temp: 98.7 F (37.1 C) 98.3 F (36.8 C)  98.7 F (37.1 C)  TempSrc: Oral Oral  Oral  SpO2: 93%  95% 95%  Weight:      Height:       CBC:  Recent Labs  Lab 01/24/22 0835 01/24/22 0837 01/25/22 0458 01/26/22 0305  WBC 7.7  --  10.5 8.9  NEUTROABS 5.4  --  8.5*  --   HGB 14.0   < > 13.2 13.5  HCT 42.4   < > 37.3* 38.8*  MCV 89.8  --  86.9 86.4  PLT 205  --  202 183   < > = values in this interval not displayed.    Basic Metabolic Panel:  Recent Labs  Lab 01/25/22 0458 01/26/22 0305  NA 136 136  K 3.6 3.4*  CL 103 103  CO2 21* 23  GLUCOSE 167* 162*  BUN 26* 22  CREATININE 1.32* 1.21  CALCIUM 8.7* 8.7*    Lipid Panel:  Recent Labs  Lab 01/25/22 0458  CHOL 119  TRIG 604*  HDL 25*  CHOLHDL 4.8  VLDL UNABLE TO CALCULATE IF TRIGLYCERIDE OVER 400 mg/dL  LDLCALC UNABLE TO CALCULATE IF TRIGLYCERIDE OVER 400 mg/dL    01/27/22:  Recent Labs  Lab 01/24/22 0835  HGBA1C 8.2*    Alcohol Level  Recent Labs  Lab 01/24/22 0835  ETH <10    IMAGING past 24 hours No results found.  PHYSICAL EXAM Constitutional: Appears well-developed and well-nourished.  Psych: Affect appropriate to situation Eyes: No scleral injection HENT: No OP obstruction; hard of hearing at baseline Head: Normocephalic.  GI: Soft.  No distension. There is no tenderness.  Skin: WDI    Neuro: Mental Status: Patient is awake, alert. Able to follow simple commands. Cranial Nerves: II: Pupils are equal, round, and reactive to light.   III,IV, VI: EOEMI, tongue protrudes midline. VII: slight Right-sided facial droop Motor: Normal antigravity in all 4 extremities. Sensory: Sensation intact to LT in all 4 extremities  ASSESSMENT/PLAN [name] is a 76 y.o. male with history of diabetes, CABG, mechanical heart valve on Coumadin and stroke who was last known well at 715 this morning.  He got up and was doing his ADLs, and at 740 waved to his wife from the bedroom and was noted to have aphasia and right-sided weakness.  EMS was called, and patient was brought to the hospital.  He was found to have left gaze deviation, right hemiparesis and global aphasia.  CT head showed no acute bleed, and CTA showed left M1 occlusion.  Consent was obtained from patient's wife, and patient was taken to IR for mechanical thrombectomy.  Initial NIHSS:20  Stroke:   left MCA occlusion s/p TICI 3 with revascularization Etiology:  likely thrombotic d/t mechanical heart valve and Coumadin at non-therapeutic level. Code Stroke CT head: No acute CT finding. Old small-vessel cerebellar  infarctions. Old infarction in the left frontal operculum. Aspects is 10, allowing for the old infarction.  CTA head & neck: Acute and near-total occlusion of the left M1 segment 8 mm from the origin. Small amount of flow in the more distal MCA branches,presumably due to leptomeningeal collaterals. Atherosclerotic disease at both carotid bifurcations. 25% stenosis of the proximal ICA on the right. No measurable stenosis on the left. Atherosclerotic disease in both carotid siphon regions with serial stenoses estimated at 70% on the right and 50% on the left. 30% stenosis of the left vertebral artery origin.Both posterior cerebral arteries take fetal origin from the anterior circulation.6. Aortic atherosclerosis.  MRI: Mild  restricted diffusion in the white matter tracts of the left basal ganglia and caudate, compatible with acute infarct Additional punctate acute infarct in the right perirolandic posterior frontal lobe. Small area of acute infarct versus artifact within the more anterior right frontal lobe white matter.  MRA: Left M1 MCA is now patent after thrombectomy. No emergent large vessel  occlusion.  2D echo EF 50-55% LDL 53 HgbA1c 8.2 VTE prophylaxis - Lovenox/Coumadin Off  heparin IV, started Coumadin with Lovenox bridge per pharmacy recommendations. Therapy recommendations:  home, no follow-up Disposition:  pending  Hypertension Home meds:  metoprolol XL 25 mg daily, Norvasc 5mg  daily Stable SBP goal <180 post-thrombectomy Long-term BP goal normotensive  Hyperlipidemia Home meds:  crestor 40mg  daily, resumed in hospital LDL 53, goal < 70 TG 604 Continue statin at discharge  Diabetes type II UnControlled Home meds:  insulin glargine 22u at bedtime, resumed.  HgbA1c 8.2, goal < 7.0 CBGs SSI ACHS Diabetes Educator Consult  Other Stroke Risk Factors Advanced Age >/= 45  Obesity, Body mass index is 33.14 kg/m., BMI >/= 30 associated with increased stroke risk, recommend weight loss, diet and exercise as appropriate   Other active problems: Urinary retention Foley inserted 12/27, flomax added 2L drained post-catheter insertion  Hospital day # 2  Pt seen by Neuro NP/APP and later by MD. Note/plan to be edited by MD as needed.    76, DNP, AGACNP-BC [facility] Please use AMION for pager and EPIC for messaging  ATTENDING NOTE: I reviewed above note and agree with the assessment and plan. Pt was seen and examined.   Daughter at the bedside. Pt neuro intact doing well. However, urinary retention, now on foley catheter. Started flomax last night. Hopefully tomorrow he is able to empty bladder once pull out foley. INR today 1.7, he is on coumadin with lovenox  bridge and statin. Transfer out of ICU.  For detailed assessment and plan, please refer to above/below as I have made changes wherever appropriate.   1/28, MD PhD Stroke Neurology 01/26/2022 5:39 PM    To contact Stroke Continuity provider, please refer to [facility] Plan. After hours, contact General Neurology

## 2022-01-26 NOTE — Evaluation (Signed)
Speech Language Pathology Evaluation Patient Details Name: [name] MRN: [id number] DOB: February 03, 1945 Today's Date: 01/26/2022 Time: 5093-2671 SLP Time Calculation (min) (ACUTE ONLY): 19 min  Problem List:  Patient Active Problem List   Diagnosis Date Noted   Acute ischemic stroke (HCC) 01/24/2022   Middle cerebral artery embolism, left 01/24/2022   Acute respiratory failure with hypoxia (HCC) 01/24/2022   Past Medical History: History reviewed. No pertinent past medical history. Past Surgical History:  Past Surgical History:  Procedure Laterality Date   RADIOLOGY WITH ANESTHESIA N/A 01/24/2022   Procedure: IR WITH ANESTHESIA;  Surgeon: Radiologist, Medication, MD;  Location: [facility] OR;  Service: Radiology;  Laterality: N/A;   HPI:  76yo male admitted 01/24/22 with aphasia and right sided weakness. PMH: DM, CAD CABG, Ascending arctic aneurysm, L MCA CVA no residual deficits, lumbar stenosis, HLD, obesity, mechanical heart valve.   Assessment / Plan / Recommendation Clinical Impression  Pt seen at bedside for evaluation of cognitive linguistic function. Pt's speech is fully intelligible. Receptive and Expressive Language are intact. Cognition is [facility]. No further ST intervention needed at this time. Please reconsult if needs arise.    SLP Assessment  SLP Recommendation/Assessment: Patient does not need any further Speech Language Pathology Services  SLP Visit Diagnosis: Aphasia (R47.01)    Recommendations for follow up therapy are one component of a multi-disciplinary discharge planning process, led by the attending physician.  Recommendations may be updated based on patient status, additional functional criteria and insurance authorization.    Follow Up Recommendations  No SLP follow up    Assistance Recommended at Discharge  Set up Supervision/Assistance  Functional Status Assessment Patient has not had a recent decline in their functional status     SLP  Evaluation Cognition  Overall Cognitive Status: Within Functional Limits for tasks assessed Orientation Level: Oriented X4       Comprehension  Auditory Comprehension Overall Auditory Comprehension: Appears within functional limits for tasks assessed Reading Comprehension Reading Status: Within funtional limits    Expression Expression Primary Mode of Expression: Verbal Verbal Expression Overall Verbal Expression: Appears within functional limits for tasks assessed Written Expression Dominant Hand: Right Written Expression: Unable to assess (comment) (pt has tremors in his right hand that make it difficult to write)   Oral / Motor  Oral Motor/Sensory Function Overall Oral Motor/Sensory Function: Within functional limits Motor Speech Overall Motor Speech: Appears within functional limits for tasks assessed Intelligibility: Intelligible           [name], [facility], CCC-SLP Speech Language Pathologist Office: [phone]  Leigh [place] 01/26/2022, 12:14 PM

## 2022-01-26 NOTE — Discharge Summary (Addendum)
Stroke Discharge Summary  Patient ID: [name]   MRN: [id number]      DOB: [birth year]  Date of Admission: 01/24/2022 Date of Discharge: 01/27/2022  Attending Physician:  Stroke, Md, MD, Stroke MD Patient's PCP:  [name], NP  DISCHARGE DIAGNOSIS: Acute ischemic stroke   Principal Problem:   Stroke:   left MCA occlusion s/p TICI 3 with revascularization Etiology:  likely thrombotic d/t mechanical heart valve and Coumadin at non-therapeutic level.  Active Problems:   Mechanical heart valve on coumadin     HTN   HLD   DM   Urinary retention   obesity   Allergies as of 01/27/2022   No Known Allergies      Medication List     TAKE these medications    amLODipine 5 MG tablet Commonly known as: NORVASC Take 5 mg by mouth daily.   Basaglar KwikPen 100 UNIT/ML Inject 22 Units into the skin at bedtime.   clopidogrel 75 MG tablet Commonly known as: PLAVIX Take 75 mg by mouth daily.   colchicine 0.6 MG tablet Take 0.6 mg by mouth 2 (two) times daily as needed (for gout).   enoxaparin 100 MG/ML injection Commonly known as: LOVENOX Inject 1 mL (100 mg total) into the skin 2 (two) times daily.   fenofibrate 145 MG tablet Commonly known as: TRICOR Take 145 mg by mouth daily.   furosemide 20 MG tablet Commonly known as: LASIX Take 20 mg by mouth daily.   isosorbide mononitrate 30 MG 24 hr tablet Commonly known as: IMDUR Take 30 mg by mouth daily.   metoprolol succinate 25 MG 24 hr tablet Commonly known as: TOPROL-XL Take 25 mg by mouth daily.   NovoLOG FlexPen 100 UNIT/ML FlexPen Generic drug: insulin aspart Inject 14 Units into the skin 3 (three) times daily with meals.   rosuvastatin 40 MG tablet Commonly known as: CRESTOR Take 40 mg by mouth daily.   tamsulosin 0.4 MG Caps capsule Commonly known as: FLOMAX Take 1 capsule (0.4 mg total) by mouth daily. Start taking on: January 28, 2022   warfarin 7.5 MG tablet Commonly known  as: COUMADIN Take 3.75-7.5 mg by mouth daily. Take 3.75 mg  on Monday wed and Fridays and then take 7.5 rest of days per patient        LABORATORY STUDIES CBC    Component Value Date/Time   WBC 8.9 01/26/2022 0305   RBC 4.49 01/26/2022 0305   HGB 13.5 01/26/2022 0305   HCT 38.8 (L) 01/26/2022 0305   PLT 183 01/26/2022 0305   MCV 86.4 01/26/2022 0305   MCH 30.1 01/26/2022 0305   MCHC 34.8 01/26/2022 0305   RDW 13.7 01/26/2022 0305   LYMPHSABS 1.0 01/25/2022 0458   MONOABS 0.8 01/25/2022 0458   EOSABS 0.1 01/25/2022 0458   BASOSABS 0.1 01/25/2022 0458   CMP    Component Value Date/Time   NA 133 (L) 01/27/2022 0315   K 3.8 01/27/2022 0315   CL 101 01/27/2022 0315   CO2 23 01/27/2022 0315   GLUCOSE 175 (H) 01/27/2022 0315   BUN 26 (H) 01/27/2022 0315   CREATININE 1.32 (H) 01/27/2022 0315   CALCIUM 9.0 01/27/2022 0315   PROT 7.1 01/24/2022 0835   ALBUMIN 3.9 01/24/2022 0835   AST 28 01/24/2022 0835   ALT 21 01/24/2022 0835   ALKPHOS 42 01/24/2022 0835   BILITOT 0.4 01/24/2022 0835   GFRNONAA 56 (L) 01/27/2022 0315   COAGS Lab Results  Component Value Date   INR 1.8 (H) 01/27/2022   INR 1.7 (H) 01/26/2022   INR 2.3 (H) 01/25/2022   Lipid Panel    Component Value Date/Time   CHOL 119 01/25/2022 0458   TRIG 604 (H) 01/25/2022 0458   HDL 25 (L) 01/25/2022 0458   CHOLHDL 4.8 01/25/2022 0458   VLDL UNABLE TO CALCULATE IF TRIGLYCERIDE OVER 400 mg/dL 01/25/2022 0458   LDLCALC UNABLE TO CALCULATE IF TRIGLYCERIDE OVER 400 mg/dL 01/25/2022 0458   HgbA1C  Lab Results  Component Value Date   HGBA1C 8.2 (H) 01/24/2022   Urinalysis No results found for: "COLORURINE", "APPEARANCEUR", "LABSPEC", "PHURINE", "GLUCOSEU", "HGBUR", "BILIRUBINUR", "KETONESUR", "PROTEINUR", "UROBILINOGEN", "NITRITE", "LEUKOCYTESUR" Urine Drug Screen No results found for: "LABOPIA", "COCAINSCRNUR", "LABBENZ", "AMPHETMU", "THCU", "LABBARB"  Alcohol Level    Component Value Date/Time   ETH <10  01/24/2022 0835     SIGNIFICANT DIAGNOSTIC STUDIES MR BRAIN WO CONTRAST  Result Date: 01/25/2022 CLINICAL DATA:  Stroke, follow up EXAM: MRI HEAD WITHOUT CONTRAST MRA HEAD WITHOUT CONTRAST TECHNIQUE: Multiplanar, multi-echo pulse sequences of the brain and surrounding structures were acquired without intravenous contrast. Angiographic images of the Circle of Willis were acquired using MRA technique without intravenous contrast. COMPARISON:  None Available. CTA head/neck 01/24/2022. FINDINGS: MRI HEAD FINDINGS Brain: Mild restricted diffusion in the white matter tracts of the left basal ganglia and caudate, compatible with acute infarct. Additional punctate acute infarct in the right perirolandic posterior frontal lobe. Small area of acute infarct versus artifact within the more anterior right frontal lobe white matter. No acute hemorrhage, mass lesion, midline shift or hydrocephalus. Small remote bilateral cerebellar lacunar infarcts. Additional remote infarct in the left frontal lobe. Vascular: Major arterial flow voids are maintained at the skull base. Skull and upper cervical spine: Normal marrow signal. Sinuses/Orbits: Mild paranasal sinus mucosal thickening. No acute orbital findings. MRA HEAD FINDINGS Anterior circulation: Hypoplastic right A1 ACA, likely congenital. Otherwise, bilateral intracranial ICAs, MCAs, and ACAs are patent without proximal high-grade stenosis. Left M1 MCA is now patent. Posterior circulation: Bilateral visualized intradural vertebral arteries, basilar artery and bilateral posterior cerebral arteries are patent without proximal high-grade stenosis. Prominent bilateral posterior communicating arteries. IMPRESSION: MRI head: 1. Mild restricted diffusion in the white matter tracts of the left basal ganglia and caudate, compatible with acute infarct 2. Additional punctate acute infarct in the right perirolandic posterior frontal lobe. 3. Small area of acute infarct versus artifact  within the more anterior right frontal lobe white matter. MRA: Left M1 MCA is now patent after thrombectomy. No emergent large vessel occlusion. Electronically Signed   By: Margaretha Sheffield M.D.   On: 01/25/2022 11:46   MR ANGIO HEAD WO CONTRAST  Result Date: 01/25/2022 CLINICAL DATA:  Stroke, follow up EXAM: MRI HEAD WITHOUT CONTRAST MRA HEAD WITHOUT CONTRAST TECHNIQUE: Multiplanar, multi-echo pulse sequences of the brain and surrounding structures were acquired without intravenous contrast. Angiographic images of the Circle of Willis were acquired using MRA technique without intravenous contrast. COMPARISON:  None Available. CTA head/neck 01/24/2022. FINDINGS: MRI HEAD FINDINGS Brain: Mild restricted diffusion in the white matter tracts of the left basal ganglia and caudate, compatible with acute infarct. Additional punctate acute infarct in the right perirolandic posterior frontal lobe. Small area of acute infarct versus artifact within the more anterior right frontal lobe white matter. No acute hemorrhage, mass lesion, midline shift or hydrocephalus. Small remote bilateral cerebellar lacunar infarcts. Additional remote infarct in the left frontal lobe. Vascular: Major arterial flow voids are maintained at  the skull base. Skull and upper cervical spine: Normal marrow signal. Sinuses/Orbits: Mild paranasal sinus mucosal thickening. No acute orbital findings. MRA HEAD FINDINGS Anterior circulation: Hypoplastic right A1 ACA, likely congenital. Otherwise, bilateral intracranial ICAs, MCAs, and ACAs are patent without proximal high-grade stenosis. Left M1 MCA is now patent. Posterior circulation: Bilateral visualized intradural vertebral arteries, basilar artery and bilateral posterior cerebral arteries are patent without proximal high-grade stenosis. Prominent bilateral posterior communicating arteries. IMPRESSION: MRI head: 1. Mild restricted diffusion in the white matter tracts of the left basal ganglia and  caudate, compatible with acute infarct 2. Additional punctate acute infarct in the right perirolandic posterior frontal lobe. 3. Small area of acute infarct versus artifact within the more anterior right frontal lobe white matter. MRA: Left M1 MCA is now patent after thrombectomy. No emergent large vessel occlusion. Electronically Signed   By: Margaretha Sheffield M.D.   On: 01/25/2022 11:46   ECHOCARDIOGRAM COMPLETE  Result Date: 01/24/2022    ECHOCARDIOGRAM REPORT   Patient Name:   VAHE PIENTA Date of Exam: 01/24/2022 Medical Rec #:  [id number]      Height: Accession #:    [id number]     Weight:       224.4 lb Date of Birth:  01-Aug-1945      BSA:          2.181 m Patient Age:    27 years       BP:           111/61 mmHg Patient Gender: M              HR:           72 bpm. Exam Location:  Inpatient Procedure: 2D Echo, Cardiac Doppler, Color Doppler and Intracardiac            Opacification Agent Indications:   Stroke I63.9  History:       Patient has no prior history of Echocardiogram examinations.                Stroke.  Sonographer:   Bernadene Person RDCS Referring      [id number] ASHISH ARORA Phys:  Sonographer Comments: Image acquisition challenging due to respiratory motion. IMPRESSIONS  1. Left ventricular ejection fraction, by estimation, is 50 to 55%. The left ventricle has low normal function. The left ventricle has no regional wall motion abnormalities. There is mild asymmetric left ventricular hypertrophy of the basal and septal segments. Left ventricular diastolic parameters were normal.  2. Right ventricular systolic function is normal. The right ventricular size is normal.  3. Left atrial size was moderately dilated.  4. The mitral valve is abnormal. Trivial mitral valve regurgitation. No evidence of mitral stenosis.  5. Mean gradient across AV 8 peak 11 mmHg AVA 2.5 cm 2 no stenosis Mechanical AVR not well seen and no records of valve type . The aortic valve has been repaired/replaced. There is  moderate calcification of the aortic valve. There is moderate thickening  of the aortic valve. Aortic valve regurgitation is not visualized. Aortic valve sclerosis/calcification is present, without any evidence of aortic stenosis.  6. Aortic dilatation noted. There is severe dilatation of the ascending aorta, measuring 46 mm.  7. The inferior vena cava is normal in size with greater than 50% respiratory variability, suggesting right atrial pressure of 3 mmHg. FINDINGS  Left Ventricle: Septal hypokinesis small area of apical dyskinesis /aneurysm no mural thrombus. Left ventricular ejection fraction, by estimation, is 50 to 55%. The left  ventricle has low normal function. The left ventricle has no regional wall motion abnormalities. Definity contrast agent was given IV to delineate the left ventricular endocardial borders. The left ventricular internal cavity size was normal in size. There is mild asymmetric left ventricular hypertrophy of the basal and septal segments. Left ventricular diastolic parameters were normal. Right Ventricle: The right ventricular size is normal. No increase in right ventricular wall thickness. Right ventricular systolic function is normal. Left Atrium: Left atrial size was moderately dilated. Right Atrium: Right atrial size was normal in size. Pericardium: There is no evidence of pericardial effusion. Mitral Valve: The mitral valve is abnormal. There is moderate thickening of the mitral valve leaflet(s). There is moderate calcification of the mitral valve leaflet(s). Trivial mitral valve regurgitation. No evidence of mitral valve stenosis. Tricuspid Valve: The tricuspid valve is normal in structure. Tricuspid valve regurgitation is mild . No evidence of tricuspid stenosis. Aortic Valve: Mean gradient across AV 8 peak 11 mmHg AVA 2.5 cm 2 no stenosis Mechanical AVR not well seen and no records of valve type. The aortic valve has been repaired/replaced. There is moderate calcification of the  aortic valve. There is moderate thickening of the aortic valve. Aortic valve regurgitation is not visualized. Aortic valve sclerosis/calcification is present, without any evidence of aortic stenosis. Aortic valve mean gradient measures 8.7 mmHg. Aortic valve peak gradient measures 13.5  mmHg. Aortic valve area, by VTI measures 2.78 cm. Pulmonic Valve: The pulmonic valve was normal in structure. Pulmonic valve regurgitation is not visualized. No evidence of pulmonic stenosis. Aorta: Aortic dilatation noted. There is severe dilatation of the ascending aorta, measuring 46 mm. Venous: The inferior vena cava is normal in size with greater than 50% respiratory variability, suggesting right atrial pressure of 3 mmHg. IAS/Shunts: No atrial level shunt detected by color flow Doppler.  LEFT VENTRICLE PLAX 2D LVIDd:         4.10 cm     Diastology LVIDs:         2.90 cm     LV e' medial:    5.26 cm/s LV PW:         1.10 cm     LV E/e' medial:  27.9 LV IVS:        1.10 cm     LV e' lateral:   10.70 cm/s LVOT diam:     2.10 cm     LV E/e' lateral: 13.7 LV SV:         137 LV SV Index:   63 LVOT Area:     3.46 cm  LV Volumes (MOD) LV vol d, MOD A2C: 53.1 ml LV vol d, MOD A4C: 88.3 ml LV vol s, MOD A2C: 18.2 ml LV vol s, MOD A4C: 28.8 ml LV SV MOD A2C:     34.9 ml LV SV MOD A4C:     88.3 ml LV SV MOD BP:      48.0 ml RIGHT VENTRICLE RV S prime:     10.50 cm/s TAPSE (M-mode): 2.2 cm LEFT ATRIUM             Index        RIGHT ATRIUM           Index LA diam:        4.80 cm 2.20 cm/m   RA Area:     23.30 cm LA Vol (A2C):   73.8 ml 33.84 ml/m  RA Volume:   64.80 ml  29.72 ml/m LA Vol (A4C):  79.7 ml 36.55 ml/m LA Biplane Vol: 75.4 ml 34.58 ml/m  AORTIC VALVE AV Area (Vmax):    2.71 cm AV Area (Vmean):   2.47 cm AV Area (VTI):     2.78 cm AV Vmax:           184.00 cm/s AV Vmean:          140.000 cm/s AV VTI:            0.494 m AV Peak Grad:      13.5 mmHg AV Mean Grad:      8.7 mmHg LVOT Vmax:         144.00 cm/s LVOT Vmean:         99.800 cm/s LVOT VTI:          0.396 m LVOT/AV VTI ratio: 0.80  AORTA Ao Root diam: 4.10 cm Ao Asc diam:  4.60 cm MITRAL VALVE                TRICUSPID VALVE MV Area (PHT): 2.20 cm     TR Peak grad:   32.3 mmHg MV Decel Time: 345 msec     TR Vmax:        284.00 cm/s MV E velocity: 147.00 cm/s MV A velocity: 116.00 cm/s  SHUNTS MV E/A ratio:  1.27         Systemic VTI:  0.40 m                             Systemic Diam: 2.10 cm [name] MD Electronically signed by [name] MD Signature Date/Time: 01/24/2022/6:14:46 PM    Final    CT ANGIO HEAD NECK W WO CM (CODE STROKE)  Result Date: 01/24/2022 CLINICAL DATA:  Acute right-sided weakness EXAM: CT ANGIOGRAPHY HEAD AND NECK TECHNIQUE: Multidetector CT imaging of the head and neck was performed using the standard protocol during bolus administration of intravenous contrast. Multiplanar CT image reconstructions and MIPs were obtained to evaluate the vascular anatomy. Carotid stenosis measurements (when applicable) are obtained utilizing NASCET criteria, using the distal internal carotid diameter as the denominator. RADIATION DOSE REDUCTION: This exam was performed according to the departmental dose-optimization program which includes automated exposure control, adjustment of the mA and/or kV according to patient size and/or use of iterative reconstruction technique. CONTRAST:  75m OMNIPAQUE IOHEXOL 350 MG/ML SOLN COMPARISON:  Head CT immediately prior FINDINGS: CTA NECK FINDINGS Aortic arch: Aortic atherosclerosis. Branching pattern is normal without origin stenosis. Right carotid system: Common carotid artery shows minimal scattered plaque but is widely patent to the bifurcation. Calcified plaque at the carotid bifurcation and proximal ICA. Minimal diameter is 3.6 mm. Compared to a more distal cervical ICA diameter of 4.8 mm, this indicates a 25% stenosis. Widely patent beyond that to the skull base. Left carotid system: Common carotid artery shows  scattered plaque but is widely patent to the bifurcation. Calcified plaque at the bifurcation and ICA bulb but without stenosis compared to the more distal cervical ICA diameter. Vertebral arteries: Right vertebral artery origin is widely patent. 30% stenosis of the left vertebral artery origin. Both vessels widely patent beyond that through the cervical region to the foramen magnum. Skeleton: Ordinary cervical spondylosis and facet arthritis. Other neck: No mass or lymphadenopathy. Upper chest: Mild apical scarring. Review of the MIP images confirms the above findings CTA HEAD FINDINGS Anterior circulation: Right internal carotid artery shows atherosclerotic calcification throughout the carotid siphon region with stenosis  estimated at 50%. There is abrupt acute in near-total occlusion of the left M1 segment 8 mm from the origin. Small amount of flow seen in the more distal MCA branches, presumably due to leptomeningeal collaterals. Left anterior cerebral artery is widely patent. Left PCA takes fetal origin from the anterior circulation. Right internal carotid artery is patent through the skull base and siphon region. There is extensive calcified plaque throughout the siphon region with serial stenoses greater than 70%. The right middle cerebral artery is patent. Right A1 segment is hypoplastic. Right PCA takes fetal origin from the anterior circulation. Posterior circulation: Both vertebral arteries are patent through the foramen magnum to the basilar. Patent posteroinferior cerebellar arteries, anterior inferior cerebellar arteries and superior cerebellar arteries. Both posterior cerebral arteries take fetal circulation origin as noted above. Venous sinuses: Patent and normal. Anatomic variants: None other significant. Review of the MIP images confirms the above findings IMPRESSION: 1. Acute and near-total occlusion of the left M1 segment 8 mm from the origin. Small amount of flow in the more distal MCA branches,  presumably due to leptomeningeal collaterals. 2. Atherosclerotic disease at both carotid bifurcations. 25% stenosis of the proximal ICA on the right. No measurable stenosis on the left. 3. Atherosclerotic disease in both carotid siphon regions with serial stenoses estimated at 70% on the right and 50% on the left. 4. 30% stenosis of the left vertebral artery origin. 5. Both posterior cerebral arteries take fetal origin from the anterior circulation. 6. Aortic atherosclerosis. 7. Findings discussed with Dr. Rory Percy and communicated by Amion at 0854 hours. Aortic Atherosclerosis (ICD10-I70.0). Electronically Signed   By: Nelson Chimes M.D.   On: 01/24/2022 09:14   CT HEAD CODE STROKE WO CONTRAST  Result Date: 01/24/2022 CLINICAL DATA:  Code stroke. Neuro deficit, acute, stroke suspected. Gait disturbance. Right-sided weakness. EXAM: CT HEAD WITHOUT CONTRAST TECHNIQUE: Contiguous axial images were obtained from the base of the skull through the vertex without intravenous contrast. RADIATION DOSE REDUCTION: This exam was performed according to the departmental dose-optimization program which includes automated exposure control, adjustment of the mA and/or kV according to patient size and/or use of iterative reconstruction technique. COMPARISON:  06/25/2021 FINDINGS: Brain: Generalized volume loss. Old small vessel cerebellar infarctions. Old infarction in the left frontal operculum with volume loss and gliosis. No sign of acute infarction based on today's scan. No hemorrhage, hydrocephalus or extra-axial collection. Vascular: There is atherosclerotic calcification of the major vessels at the base of the brain. Skull: Normal Sinuses/Orbits: Mild inflammatory changes of the left maxillary sinus. Orbits negative. Other: None ASPECTS ([place] Stroke Program Early CT Score) - Ganglionic level infarction (caudate, lentiform nuclei, internal capsule, insula, M1-M3 cortex): 7, allowing for the old infarction. -  Supraganglionic infarction (M4-M6 cortex): 3, allowing for the old infarction Total score (0-10 with 10 being normal): 10, allowing for the old infarction. IMPRESSION: 1. No acute CT finding. Old small-vessel cerebellar infarctions. Old infarction in the left frontal operculum. 2. Aspects is 10, allowing for the old infarction. These results were communicated to Dr. Rory Percy at 8:51 am on 01/24/2022 by text page via the [facility] messaging system. Electronically Signed   By: Nelson Chimes M.D.   On: 01/24/2022 08:53      HISTORY OF PRESENT ILLNESS Kodie Kishi is a 76 y.o. male with history of diabetes, CABG, mechanical heart valve on Coumadin and stroke who was last known well at 715 this morning.  He got up and was doing his ADLs, and at 740 waved  to his wife from the bedroom and was noted to have aphasia and right-sided weakness.  EMS was called, and patient was brought to the hospital.  He was found to have left gaze deviation, right hemiparesis and global aphasia.  CT head showed no acute bleed, and CTA showed left M1 occlusion.  Consent was obtained from patient's wife, and patient was taken to IR for mechanical thrombectomy.  Initial NIHSS:20    HOSPITAL COURSE Stroke:   left MCA occlusion s/p TICI 3 with revascularization Etiology:  likely thrombotic d/t mechanical heart valve and Coumadin at non-therapeutic level. Code Stroke CT head: No acute CT finding. Old small-vessel cerebellar infarctions. Old infarction in the left frontal operculum. Aspects is 10, allowing for the old infarction.   CTA head & neck: Acute and near-total occlusion of the left M1 segment 8 mm from the origin. Small amount of flow in the more distal MCA branches,presumably due to leptomeningeal collaterals. Atherosclerotic disease at both carotid bifurcations. 25% stenosis of the proximal ICA on the right. No measurable stenosis on the left. Atherosclerotic disease in both carotid siphon regions with serial stenoses estimated at 70%  on the right and 50% on the left. 30% stenosis of the left vertebral artery origin.Both posterior cerebral arteries take fetal origin from the anterior circulation.6. Aortic atherosclerosis.   MRI: Mild restricted diffusion in the white matter tracts of the left basal ganglia and caudate, compatible with acute infarct Additional punctate acute infarct in the right perirolandic posterior frontal lobe. Small area of acute infarct versus artifact within the more anterior right frontal lobe white matter.   MRA: Left M1 MCA is now patent after thrombectomy. No emergent large vessel  occlusion. 2D echo EF 50-55% LDL 53 HgbA1c 8.2 VTE prophylaxis - Lovenox/Coumadin Off  heparin IV, started Coumadin with Lovenox bridge per pharmacy recommendations. INR goal 2.5-3.5 Therapy recommendations:  home, no follow-up Disposition:  home today  Hypertension Home meds:  metoprolol XL 25 mg daily, Norvasc '5mg'$  daily Stable Long-term BP goal normotensive   Hyperlipidemia Home meds:  crestor '40mg'$  daily, resumed in hospital LDL 53, goal < 70 TG 604 Continue statin at discharge   Diabetes type II UnControlled Home meds:  insulin glargine 22u at bedtime, resumed.  HgbA1c 8.2, goal < 7.0 CBGs SSI ACHS Diabetes Educator Consult  Other Stroke Risk Factors Advanced Age >/= 50  Obesity, Body mass index is 33.14 kg/m., BMI >/= 30 associated with increased stroke risk, recommend weight loss, diet and exercise as appropriate    Other active problems: Urinary retention Curbside consult with Dr. Lorelee Cover reinserted before discharge Follow-up with Urology for voiding trial     DISCHARGE EXAM Blood pressure 130/73, pulse 71, temperature 98.6 F (37 C), temperature source Oral, resp. rate 18, height '5\' 9"'$  (1.753 m), weight 101.8 kg, SpO2 93 %.  Patient ambulating in room. Wife and daughter at bedside.  PHYSICAL EXAM Constitutional: Appears well-developed and well-nourished.  Psych: Affect  appropriate to situation Eyes: No scleral injection HENT: No OP obstruction; hard of hearing at baseline Head: Normocephalic.  GI: Soft.  No distension. There is no tenderness.  Skin: WDI   Neuro: Mental Status: Patient is awake, alert. Able to follow simple commands. Cranial Nerves: II: Pupils are equal, round, and reactive to light.   III,IV, VI: EOEMI, tongue protrudes midline. VII: slight Right-sided facial droop Motor: Normal antigravity in all 4 extremities. Sensory: Sensation intact to LT in all 4 extremities  Discharge Diet  Diet   Diet heart healthy/carb modified Room service appropriate? Yes with Assist; Fluid consistency: Thin   liquids  DISCHARGE PLAN Disposition:  home Visit coumadin clinic on Tuesday 1/2 for lab work Resume home coumadin after inpatient coumadin/lovenox bridge. Continue taking lovenox until coumadin clinic instructs you on further recommendations based on your lab work.  INR goal 2.5-3.5  Ongoing stroke risk factor control by Primary Care Physician at time of discharge Follow-up PCP [name], NP in 2 weeks. Follow-up in [facility] in 8 weeks, office to schedule an appointment.  Follow-up with Dr. Louis Meckel (urology) for voiding trial next week. Office will call you to schedule.   45 minutes were spent preparing discharge.   Pt seen by Neuro NP/APP and later by MD. Note/plan to be edited by MD as needed.    Otelia Santee, DNP, AGACNP-BC [facility] Please use AMION for pager and EPIC for messaging  ATTENDING NOTE: I reviewed above note and agree with the assessment and plan. Pt was seen and examined.   Wife and daughter are at the bedside. Pt sitting at the edge of bed, no complains. Foley catheter removed this morning but pt still has significant post void residue. Curbside consulted urology, recommend re-insert the foley catheter and follow up at clinic for voiding trial.  Continue home plavix and coumadin, now with lovenox bridge. INR goal 2.5-3.5. continue statin. Follow up at [place] In 4 weeks.   For detailed assessment and plan, please refer to above/below as I have made changes wherever appropriate.   Rosalin Hawking, MD PhD Stroke Neurology 01/27/2022 11:04 PM

## 2022-01-27 ENCOUNTER — Other Ambulatory Visit: Payer: Self-pay | Admitting: Neurology

## 2022-01-27 DIAGNOSIS — Z952 Presence of prosthetic heart valve: Secondary | ICD-10-CM | POA: Diagnosis not present

## 2022-01-27 DIAGNOSIS — Z7901 Long term (current) use of anticoagulants: Secondary | ICD-10-CM | POA: Diagnosis not present

## 2022-01-27 DIAGNOSIS — I63412 Cerebral infarction due to embolism of left middle cerebral artery: Secondary | ICD-10-CM

## 2022-01-27 DIAGNOSIS — I639 Cerebral infarction, unspecified: Secondary | ICD-10-CM | POA: Diagnosis not present

## 2022-01-27 LAB — BASIC METABOLIC PANEL
Anion gap: 9 (ref 5–15)
BUN: 26 mg/dL — ABNORMAL HIGH (ref 8–23)
CO2: 23 mmol/L (ref 22–32)
Calcium: 9 mg/dL (ref 8.9–10.3)
Chloride: 101 mmol/L (ref 98–111)
Creatinine, Ser: 1.32 mg/dL — ABNORMAL HIGH (ref 0.61–1.24)
GFR, Estimated: 56 mL/min — ABNORMAL LOW (ref >60)
Glucose, Bld: 175 mg/dL — ABNORMAL HIGH (ref 70–99)
Potassium: 3.8 mmol/L (ref 3.5–5.1)
Sodium: 133 mmol/L — ABNORMAL LOW (ref 135–145)

## 2022-01-27 LAB — GLUCOSE, CAPILLARY
Glucose-Capillary: 256 mg/dL — ABNORMAL HIGH (ref 70–99)
Glucose-Capillary: 228 mg/dL — ABNORMAL HIGH (ref 70–99)
Glucose-Capillary: 205 mg/dL — ABNORMAL HIGH (ref 70–99)

## 2022-01-27 LAB — PROTIME-INR
INR: 1.8 — ABNORMAL HIGH (ref 0.8–1.2)
Prothrombin Time: 20.4 seconds — ABNORMAL HIGH (ref 11.4–15.2)

## 2022-01-27 MED ORDER — ENOXAPARIN SODIUM 100 MG/ML IJ SOSY: 100.0000 mg | PREFILLED_SYRINGE | Freq: Two times a day (BID) | INTRAMUSCULAR | 0 refills | Status: DC

## 2022-01-27 MED ORDER — WARFARIN SODIUM 7.5 MG PO TABS
7.5000 mg | ORAL_TABLET | Freq: Once | ORAL | Status: AC
  Administered 2022-01-27: 7.5 mg via ORAL
  Filled 2022-01-27: qty 1

## 2022-01-27 MED ORDER — TAMSULOSIN HCL 0.4 MG PO CAPS: 0.4000 mg | ORAL_CAPSULE | Freq: Every day | ORAL | Status: DC

## 2022-01-27 MED ORDER — TAMSULOSIN HCL 0.4 MG PO CAPS: 0.4000 mg | ORAL_CAPSULE | Freq: Every day | ORAL | 0 refills | Status: DC

## 2022-01-27 NOTE — Progress Notes (Signed)
Patient called RN to room after urinating.  Post void residual 185 mL.  Patient states "I still feel like I can go more" but wanted to finish eating breakfast before attempting to urinate again.  Patient informed to let RN know after he urinates again for another post-void residual bladder scan.

## 2022-01-27 NOTE — Care Management Important Message (Signed)
Important Message  Patient Details  Name: [name] MRN: [id number] Date of Birth: [birth year]   Medicare Important Message Given:  Yes     Hannah Beat 01/27/2022, 10:56 AM

## 2022-01-27 NOTE — Progress Notes (Signed)
Physical Therapy Treatment Patient Details Name: [name] MRN: [id number] DOB: May 26, 1945 Today's Date: 01/27/2022   History of Present Illness 76 yo male presents to [facility] [place] on 12/26 with aphasia, R weakness, L gaze. CT head showed no acute bleed, and CTA showed left M1 occlusion. S/p L common carotid arteriogram, revascularization of occluded L MCA M1 segment achieving aTICI 3 revascularization on 12/26. PMH includes DM, CABG, mechanical heart valve on coumadin, prior CVA.    PT Comments    Pt has met all goals at S level.  Emphasis today on ambulation/mobility at age appropriate levels and speeds.    Recommendations for follow up therapy are one component of a multi-disciplinary discharge planning process, led by the attending physician.  Recommendations may be updated based on patient status, additional functional criteria and insurance authorization.  Follow Up Recommendations  No PT follow up     Assistance Recommended at Discharge Set up Supervision/Assistance  Patient can return home with the following Assistance with cooking/housework;Assist for transportation   Equipment Recommendations  None recommended by PT    Recommendations for Other Services       Precautions / Restrictions Precautions Precautions: Fall     Mobility  Bed Mobility               General bed mobility comments: OOB on arrival    Transfers Overall transfer level: Needs assistance   Transfers: Sit to/from Stand Sit to Stand: Supervision                Ambulation/Gait Ambulation/Gait assistance: Supervision Gait Distance (Feet): 350 Feet Assistive device: None Gait Pattern/deviations: Step-through pattern   Gait velocity interpretation: >2.62 ft/sec, indicative of community ambulatory   General Gait Details: steady gait, able to attain age appropriate speeds, but limited by ?hernia pain to attaining max speed.  Pt able to change speed and direction, scann, step over objects  and back up without any overt deviation   Stairs             Wheelchair Mobility    Modified Rankin (Stroke Patients Only) Modified Rankin (Stroke Patients Only) Modified Rankin: Slight disability     Balance Overall balance assessment: Needs assistance   Sitting balance-Leahy Scale: Good       Standing balance-Leahy Scale: Good                              Cognition Arousal/Alertness: Awake/alert Behavior During Therapy: WFL for tasks assessed/performed Overall Cognitive Status: Within Functional Limits for tasks assessed                                          Exercises      General Comments General comments (skin integrity, edema, etc.): reinforced to pt/wife learning about pt's unique risk factors and reviewed BE FAST.      Pertinent Vitals/Pain Pain Assessment Pain Assessment: Faces Faces Pain Scale: No hurt Pain Intervention(s): Monitored during session    Home Living                          Prior Function            PT Goals (current goals can now be found in the care plan section) Acute Rehab PT Goals Patient Stated Goal: home PT Goal Formulation: With patient  Time For Goal Achievement: 02/08/22 Potential to Achieve Goals: Good Progress towards PT goals: Progressing toward goals    Frequency    Min 4X/week      PT Plan Current plan remains appropriate    Co-evaluation              AM-PAC PT "6 Clicks" Mobility   Outcome Measure  Help needed turning from your back to your side while in a flat bed without using bedrails?: None Help needed moving from lying on your back to sitting on the side of a flat bed without using bedrails?: None Help needed moving to and from a bed to a chair (including a wheelchair)?: A Little Help needed standing up from a chair using your arms (e.g., wheelchair or bedside chair)?: A Little Help needed to walk in hospital room?: A Little Help needed  climbing 3-5 steps with a railing? : A Little 6 Click Score: 20    End of Session   Activity Tolerance: Patient tolerated treatment well Patient left: in bed;with call bell/phone within reach;with family/visitor present Nurse Communication: Mobility status PT Visit Diagnosis: Other abnormalities of gait and mobility (R26.89);Other symptoms and signs involving the nervous system (T02.409)     Time: 7353-2992 PT Time Calculation (min) (ACUTE ONLY): 20 min  Charges:  $Gait Training: 8-22 mins                     01/27/2022  Ginger Carne., PT Acute Rehabilitation Services [phone]  (office)   Tessie Fass Boruch Manuele 01/27/2022, 5:51 PM

## 2022-01-27 NOTE — Progress Notes (Signed)
Initial bladder scan s/p foley removal was 221 mL @ 1037 on 01/27/22.  Patient "starting" to feel urge to urinate. Encouraged by MD and RN to drink more fluids.  Patient will attempt to urinate after drinking more fluids.  Patient informed that RN will perform s/p void residual bladder scan after he urinates.  If patient unable to urinate within next hour, RN will bladder scan patient again and notify MD.

## 2022-01-27 NOTE — Progress Notes (Signed)
Placed foley cath per MD order.  Patient to go home with indwelling catheter in place and follow-up with Urology.  Per provider, [facility] will call patient next week with an appointment.  RN reviewed catheter care with patient and patient's spouse.  RN changed bag to leg bag for easy transport home.  RN included an extra large bag for patient to change leg bag before bed tonight.  RN reviewed AVS, including stroke care, stroke s/s, when to call MD, follow-up appointments, and medications and pharmacy pick-up location, with patient and patient's spouse using teach-back method.  All questions/ concerns answered.  Patient taken to private vehicle via wheelchair.

## 2022-01-27 NOTE — Progress Notes (Signed)
ANTICOAGULATION CONSULT NOTE  Pharmacy Consult:  Lovenox / Coumadin Indication: History of mechanical AVR, CVA, Afib  No Known Allergies  Patient Measurements: Height: '5\' 9"'$  (175.3 cm) Weight: 101.8 kg (224 lb 6.9 oz) IBW/kg (Calculated) : 70.7 Heparin Dosing Weight: 93 kg  Vital Signs: Temp: 98.6 F (37 C) (12/29 1604) Temp Source: Oral (12/29 1604) BP: 130/73 (12/29 1604) Pulse Rate: 71 (12/29 1604)  Labs: Recent Labs    01/24/22 2200 01/25/22 0458 01/25/22 0838 01/26/22 0305 01/27/22 0315  HGB  --  13.2  --  13.5  --   HCT  --  37.3*  --  38.8*  --   PLT  --  202  --  183  --   LABPROT  --   --  24.8* 19.4* 20.4*  INR  --   --  2.3* 1.7* 1.8*  HEPARINUNFRC <0.10*  --  0.25*  --   --   CREATININE  --  1.32*  --  1.21 1.32*     Estimated Creatinine Clearance: 56 mL/min (A) (by C-G formula based on SCr of 1.32 mg/dL (H)).   Assessment: 34 YOM admitted as a code stroke with near total occlusion of M1 segment, now s/p revascularization by IR.  Patient has a history of a [employer] mechanical AVR on Coumadin PTA.  He also had a CVA in May 2023 and was in Afib at that time.  INR sub-therapeutic on admit at 2.2.  Pharmacy consulted to bridge with IV heparin.  Now transitioned to Lovenox and Coumadin since 12/27.  Renal function improving.  INR increased slightly to 1.8 after 2 doses of warfarin 7.'5mg'$  daily.  CBC not checked; no bleeding reported.  Home Coumadin regimen: 7.'5mg'$  daily except 3.'75mg'$  on MWF   Goal of Therapy:  INR 2.5-3.5 Anti-Xa: 0.6-1 units/mL Monitor platelets by anticoagulation protocol: Yes   Plan:  Continue Lovenox '100mg'$  SQ Q12H Repeat Coumadin 7.'5mg'$  PO today Daily PT / INR, CBC Q72H while on Lovenox   If discharging home, would resume warfarin home regimen of 7.'5mg'$  Tu/Th/Sa/Su and 3.'75mg'$  on MWF. Continue enoxaparin bridging until pt can have INR checked in clinic on Tuesday. Would provide at least 5 days worth of enoxaparin for bridging.   Luisa Hart, PharmD, BCPS Clinical Pharmacist 01/27/2022 4:41 PM   Please refer to [facility] for pharmacy phone number

## 2022-01-28 DIAGNOSIS — I10 Essential (primary) hypertension: Secondary | ICD-10-CM | POA: Diagnosis not present

## 2022-01-28 DIAGNOSIS — G4733 Obstructive sleep apnea (adult) (pediatric): Secondary | ICD-10-CM | POA: Diagnosis not present

## 2022-01-31 ENCOUNTER — Telehealth: Payer: Self-pay | Admitting: *Deleted

## 2022-01-31 ENCOUNTER — Telehealth: Payer: Self-pay

## 2022-01-31 ENCOUNTER — Encounter: Payer: Self-pay | Admitting: Hematology & Oncology

## 2022-01-31 ENCOUNTER — Encounter: Payer: Self-pay | Admitting: Family

## 2022-01-31 NOTE — Patient Outreach (Signed)
  Care Coordination TOC Note Transition Care Management Follow-up Telephone Call Date of discharge and from where: [facility] 01/24/22-01/27/22 How have you been since you were released from the hospital? "I am doing fine except that I still have a foley catheter" Any questions or concerns? No  Items Reviewed: Did the pt receive and understand the discharge instructions provided? Yes  Medications obtained and verified? Yes  Other? No  Any new allergies since your discharge? No  Dietary orders reviewed? No Do you have support at home? Yes   Home Care and Equipment/Supplies: Were home health services ordered? no If so, what is the name of the agency? N/A  Has the agency set up a time to come to the patient's home? no Were any new equipment or medical supplies ordered?  No What is the name of the medical supply agency? N/a Were you able to get the supplies/equipment? not applicable Do you have any questions related to the use of the equipment or supplies? No  Functional Questionnaire: (I = Independent and D = Dependent) ADLs: I  Bathing/Dressing- I  Meal Prep- I  Eating- I  Maintaining continence- I  Transferring/Ambulation- I  Managing Meds- I  Follow up appointments reviewed:  PCP Hospital f/u appt confirmed? No   Specialist Hospital f/u appt confirmed? No   Are transportation arrangements needed? No  If their condition worsens, is the pt aware to call PCP or go to the Emergency Dept.? Yes Was the patient provided with contact information for the PCP's office or ED? Yes Was to pt encouraged to call back with questions or concerns? Yes  SDOH assessments and interventions completed:   Yes SDOH Interventions Today    Flowsheet Row Most Recent Value  SDOH Interventions   Housing Interventions Intervention Not Indicated  Transportation Interventions Intervention Not Indicated       Care Coordination Interventions:  PCP follow up appointment requested   Encounter  Outcome:  Pt. Visit Completed

## 2022-01-31 NOTE — Progress Notes (Signed)
  Care Coordination  Note  01/31/2022 Name: [name] MRN: [id number] DOB: 1945/07/15  [name] is a 77 y.o. year old primary care patient of [name], NP. I reached out to Daleen Squibb by phone today to assist with scheduling a follow up appointment. [name] verbally consented to my assistance.       Follow up plan: Hospital Follow Up appointment scheduled with Inda Castle, Lenna Sciara, NP) on (02/06/22) at (10:40 AM).  [place]  Direct Dial: [phone]

## 2022-02-01 ENCOUNTER — Ambulatory Visit: Payer: Medicare HMO | Attending: Cardiovascular Disease | Admitting: *Deleted

## 2022-02-01 DIAGNOSIS — Z7901 Long term (current) use of anticoagulants: Secondary | ICD-10-CM

## 2022-02-01 DIAGNOSIS — Z952 Presence of prosthetic heart valve: Secondary | ICD-10-CM

## 2022-02-01 LAB — POCT INR: INR: 2.4 (ref 2.0–3.0)

## 2022-02-01 NOTE — Patient Instructions (Addendum)
Description   Continue Lovenox dose today. Today take 1 tablet of warfarin then start taking warfarin 1 tablet daily except for 1/2 tablet on Wednesdays and Fridays. Continue consistency with leafy green intake. Recheck INR in 1 week. Coumadin Clinic [phone] or [phone].

## 2022-02-03 ENCOUNTER — Encounter (HOSPITAL_COMMUNITY): Payer: Self-pay

## 2022-02-03 DIAGNOSIS — R338 Other retention of urine: Secondary | ICD-10-CM | POA: Diagnosis not present

## 2022-02-03 HISTORY — PX: IR CT HEAD LTD: IMG2386

## 2022-02-06 ENCOUNTER — Ambulatory Visit (INDEPENDENT_AMBULATORY_CARE_PROVIDER_SITE_OTHER): Payer: Medicare HMO | Admitting: Family

## 2022-02-06 VITALS — BP 100/59 | HR 69 | Temp 98.3°F | Resp 16 | Wt 215.0 lb

## 2022-02-06 DIAGNOSIS — Z952 Presence of prosthetic heart valve: Secondary | ICD-10-CM

## 2022-02-06 DIAGNOSIS — I639 Cerebral infarction, unspecified: Secondary | ICD-10-CM

## 2022-02-06 DIAGNOSIS — E1159 Type 2 diabetes mellitus with other circulatory complications: Secondary | ICD-10-CM

## 2022-02-06 DIAGNOSIS — I10 Essential (primary) hypertension: Secondary | ICD-10-CM | POA: Diagnosis not present

## 2022-02-06 DIAGNOSIS — Z8673 Personal history of transient ischemic attack (TIA), and cerebral infarction without residual deficits: Secondary | ICD-10-CM

## 2022-02-06 DIAGNOSIS — E785 Hyperlipidemia, unspecified: Secondary | ICD-10-CM

## 2022-02-06 DIAGNOSIS — E1142 Type 2 diabetes mellitus with diabetic polyneuropathy: Secondary | ICD-10-CM

## 2022-02-06 DIAGNOSIS — E781 Pure hyperglyceridemia: Secondary | ICD-10-CM

## 2022-02-06 DIAGNOSIS — Z794 Long term (current) use of insulin: Secondary | ICD-10-CM | POA: Diagnosis not present

## 2022-02-06 NOTE — Progress Notes (Signed)
Subjective:   By signing my name below, I, [name], attest that this documentation has been prepared under the direction and in the presence of Sandford Craze, NP. 02/06/2022   Patient ID: [name], male    DOB: [birth year], 77 y.o.   MRN: [id number]  Chief Complaint  Patient presents with   Follow-up    Here for hospital follow up after stroke     HPI Patient is in today for a hospital follow up visit.   Hospital follow up: Patient reports passing out on 01/24/2022. He woke up and had numbness in his right side and could not speak. His wife reports waking up shortly after and he was taken to the hospital through ambulance. Work up revealed a Left MCA occlusion and he underwent TICI 3 with revascularization. His procedure was completed successfully and he regained speech and movement and feeling on his right side. He feels like he has 100% function back on his right side. His wife notes that he continues having tremors on his right hand since this stroke which are not new but seem more prominent.   It was felt that his stroke was the result of his subtherapeutic admission INR (2.2) in the setting of mechanical heart valve. He continues to follow closely with the coumadin clinic following discharge. He has a follow up appointment next week.  Lab Results  Component Value Date   INR 2.4 02/01/2022   INR 1.8 (H) 01/27/2022   INR 1.7 (H) 01/26/2022   Urinary retention: He had a foley catheter placed during his hospitalization due to urinary retention. Upon removal of the foley he reports developing a scab on his penis which is improving. He is urinating ok at this time. He feels like he voids most of his urine while urinating. He continues taking Flomax regularly. He has follow up scheduled   Blood sugar: He continues following up with his endocrinlogist regularly. His A1c increased during his last check. His wife reports this may be due to their recent travels.  Lab Results   Component Value Date   HGBA1C 8.2 (H) 01/24/2022   Cholesterol: His last cholesterol levels were at goal while taking 40 mg Crestor and 145 mg fenofibrate.  Lab Results  Component Value Date   CHOL 119 01/25/2022   HDL 25 (L) 01/25/2022   LDLCALC UNABLE TO CALCULATE IF TRIGLYCERIDE OVER 400 mg/dL 08/65/7846   LDLDIRECT 53 01/26/2022   TRIG 604 (H) 01/25/2022   CHOLHDL 4.8 01/25/2022   Blood pressure: His blood pressure is doing well while taking 5 mg amlodipine, 20 mg lasix, 25 mg metoprolol succinate.  BP Readings from Last 3 Encounters:  02/06/22 (!) 100/59  01/27/22 130/73  01/06/22 124/62   Pulse Readings from Last 3 Encounters:  02/06/22 69  01/27/22 71  01/06/22 61   Gout: No recent gout flare ups.    Past Medical History:  Diagnosis Date   Actinic keratosis 02/14/2011   Allergy    Aneurysm of ascending aorta without rupture (HCC) 01/06/2022   Aortic stenosis    Arthritis    Bilateral sensorineural hearing loss 06/16/2009   CAD (coronary artery disease)    cabg   Complication of anesthesia    Coronary atherosclerosis of native coronary artery 06/15/2009   Diabetic polyneuropathy 11/11/2018   Dyslipidemia 11/05/2018   Embolic stroke involving left middle cerebral artery  05/30/2021   Erectile dysfunction 05/18/2009   Essential hypertension 06/16/2009   Expressive aphasia 06/25/2021   External  hemorrhoids 03/21/2010   Gout 04/09/2015   Heart murmur    Hemochromatosis 02/14/2011   History of aortic valve replacement 10/13/2009   History of CABG 11/05/2018   2005- 1. Coronary artery bypass grafting x4 with LIMA-LAD, SVG-OM1, SVG-AM and dCFX.  2. Aortic valve replacement with [employer] AVR 3. Septal myomectomy.  4. Myoview low risk 2016   History of hepatitis 01/31/1972   Hypertriglyceridemia 06/13/2010   Hypertrophic obstructive cardiomyopathy 05/06/2008   SP septal myomectomy at surgery 2005 Echo Nov 2018- Hyperdynamic LVEF with severe basal septal  hypertrophy. There is chordal SAM with resting gradient of 16 mmHg that increases to 85 mmHg with Valsalva        Leg weakness, bilateral 01/18/2021   Long term (current) use of anticoagulants 05/22/2018   Lumbar stenosis with neurogenic claudication 02/04/2021   Malignant neoplasm of prostate 06/17/2009   Mechanical heart valve present    Manufacturer: [employer] #: [id number]  Model #: [id number]. Card states MRI compatible with 3 teslas or less.   Obesity, unspecified 04/29/2008   OSA (obstructive sleep apnea) 07/12/2012   moderate-severe; uses CPAP nightly   PONV (postoperative nausea and vomiting)    after CABG- slow to wake up   Presence of prosthetic heart valve    Primary osteoarthritis of left shoulder 10/31/2018   S/P lumbar laminectomy 02/04/2021   Stage 3 chronic kidney disease due to type 2 diabetes mellitus 11/11/2018   Tobacco use 06/16/2009   Type 2 diabetes mellitus with hyperglycemia    Ventral hernia 03/03/2014    Past Surgical History:  Procedure Laterality Date   AORTIC VALVE REPLACEMENT  11/14/2003   [facility]   APPENDECTOMY  1990   CHOLECYSTECTOMY  1990   CORONARY ANGIOGRAPHY  10/11/2021   CORONARY ARTERY BYPASS GRAFT  10/2003   HERNIA REPAIR  1999   right, inguinal   HERNIA REPAIR  2002   left, inguinal   HIP SURGERY  2006   right hip   IR CT HEAD LTD  02/03/2022   IR PERCUTANEOUS ART THROMBECTOMY/INFUSION INTRACRANIAL INC DIAG ANGIO  01/24/2022   LUMBAR LAMINECTOMY/DECOMPRESSION MICRODISCECTOMY Bilateral 02/04/2021   Procedure: Bilateral Lumbar Two-Three Laminectomy;  Surgeon: [name], MD;  Location: [facility] OR;  Service: Neurosurgery;  Laterality: Bilateral;  3C/RM 20   PILONIDAL CYST EXCISION  1964   prostate seed implant  03/2010   RADIOLOGY WITH ANESTHESIA N/A 01/24/2022   Procedure: IR WITH ANESTHESIA;  Surgeon: Radiologist, Medication, MD;  Location: [facility] OR;  Service: Radiology;  Laterality: N/A;   TONSILLECTOMY  childhood    Family  History  Problem Relation Age of Onset   Heart disease Mother    Stroke Mother    Heart disease Father    Asperger's syndrome Son    Hyperlipidemia Son    Coronary artery disease Brother    Cancer Neg Hx        negative for colon cancer    Social History   Socioeconomic History   Marital status: Married    Spouse name: Not on file   Number of children: Not on file   Years of education: 18   Highest education level: Master's degree (e.g., MA, MS, MEng, MEd, MSW, MBA)  Occupational History   Occupation: Retired  Tobacco Use   Smoking status: Not on file   Smokeless tobacco: Never   Tobacco comments:    Pt states he smokes 1 cigar about once a month. 06/01/2021    Pt  quit  Cigars 06/2021  Substance and Sexual Activity   Alcohol use: Not Currently    Comment: wine maybe 1 per month   Drug use: No   Sexual activity: Never  Other Topics Concern   Not on file  Social History Narrative   ** Merged History Encounter **       Right Handed  Lives in a one sotyr home. One step leads to front door.    Social Determinants of Health   Financial Resource Strain: Not on file  Food Insecurity: Not on file  Transportation Needs: No Transportation Needs (01/31/2022)   PRAPARE - Administrator, Civil Service (Medical): No    Lack of Transportation (Non-Medical): No  Physical Activity: Not on file  Stress: Not on file  Social Connections: Not on file  Intimate Partner Violence: Not on file    Outpatient Medications Prior to Visit  Medication Sig Dispense Refill   AMBULATORY NON FORMULARY MEDICATION cpap cushions            AirFit F20 (Size: Large)           AirFit F30 (Size: Med)   Please FAX the prescription to:  [phone] 12 each prn   amLODipine (NORVASC) 5 MG tablet TAKE 1 TABLET (5 MG TOTAL) BY MOUTH DAILY. 90 tablet 1   betamethasone dipropionate (DIPROLENE) 0.05 % cream Apply topically 2 (two) times daily as needed. To eczema rash 30 g 1   Blood Glucose  Monitoring Suppl Supplies MISC Use for monitoring glucose level 100 each 1   clopidogrel (PLAVIX) 75 MG tablet TAKE 1 TABLET BY MOUTH EVERY DAY 90 tablet 1   colchicine 0.6 MG tablet TAKE 1 TABLET (0.6 MG TOTAL) BY MOUTH 2 (TWO) TIMES DAILY AS NEEDED. 180 tablet 0   Continuous Blood Gluc Sensor (DEXCOM G6 SENSOR) MISC 1 Device by Does not apply route as directed. 9 each 3   dapagliflozin propanediol (FARXIGA) 10 MG TABS tablet Take 1 tablet (10 mg total) by mouth daily before breakfast. 90 tablet 3   fenofibrate (TRICOR) 145 MG tablet TAKE 1 TABLET BY MOUTH EVERY DAY 90 tablet 1   furosemide (LASIX) 20 MG tablet TAKE 1 TABLET BY MOUTH EVERY DAY AS NEEDED FOR SWELLING 90 tablet 1   glucose blood (ONETOUCH VERIO) test strip Use as instructed 100 strip 12   insulin aspart (NOVOLOG FLEXPEN) 100 UNIT/ML FlexPen Max daily 70 units 75 mL 3   Insulin Glargine (BASAGLAR KWIKPEN) 100 UNIT/ML Inject 22 Units into the skin daily. 30 mL 3   Insulin Pen Needle 32G X 4 MM MISC 1 Device by Does not apply route in the morning, at noon, in the evening, and at bedtime. 400 each 3   isosorbide mononitrate (IMDUR) 30 MG 24 hr tablet Take 30 mg by mouth daily.     metoprolol succinate (TOPROL-XL) 25 MG 24 hr tablet TAKE 1 TABLET BY MOUTH EVERY DAY 90 tablet 1   NOVOLOG FLEXPEN 100 UNIT/ML FlexPen Inject 14 Units into the skin 3 (three) times daily with meals.     OneTouch Delica Lancets 33G MISC USE AS DIRECTED 100 each 1   rosuvastatin (CRESTOR) 40 MG tablet TAKE 1 TABLET BY MOUTH EVERY DAY 90 tablet 3   tacrolimus (PROTOPIC) 0.1 % ointment Apply 1 application  topically daily as needed (irritation).     tamsulosin (FLOMAX) 0.4 MG CAPS capsule Take 1 capsule (0.4 mg total) by mouth daily. 30 capsule 0   warfarin (  COUMADIN) 7.5 MG tablet TAKE 1/2 A TABLET TO 1 TABLET BY MOUTH DAILY AS DIRECTED BY THE COUMADIN CLINIC 105 tablet 1   amLODipine (NORVASC) 5 MG tablet Take 5 mg by mouth daily.     clopidogrel (PLAVIX) 75  MG tablet Take 75 mg by mouth daily.     colchicine 0.6 MG tablet Take 0.6 mg by mouth 2 (two) times daily as needed (for gout).     Continuous Blood Gluc Transmit (DEXCOM G6 TRANSMITTER) MISC 1 Device by Does not apply route as directed. 1 each 3   enoxaparin (LOVENOX) 100 MG/ML injection Inject 1 mL (100 mg total) into the skin 2 (two) times daily. 12 mL 0   fenofibrate (TRICOR) 145 MG tablet Take 145 mg by mouth daily.     furosemide (LASIX) 20 MG tablet Take 20 mg by mouth daily.     Insulin Glargine (BASAGLAR KWIKPEN) 100 UNIT/ML Inject 22 Units into the skin at bedtime.     isosorbide mononitrate (IMDUR) 30 MG 24 hr tablet TAKE 1 TABLET BY MOUTH EVERY DAY 90 tablet 1   metoprolol succinate (TOPROL-XL) 25 MG 24 hr tablet Take 25 mg by mouth daily.     rosuvastatin (CRESTOR) 40 MG tablet Take 40 mg by mouth daily.     warfarin (COUMADIN) 7.5 MG tablet Take 3.75-7.5 mg by mouth daily. Take 3.75 mg  on Monday wed and Fridays and then take 7.5 rest of days per patient     No facility-administered medications prior to visit.    No Known Allergies  ROS See HPI    Objective:    Physical Exam Constitutional:      General: He is not in acute distress.    Appearance: Normal appearance. He is not ill-appearing.  HENT:     Head: Normocephalic and atraumatic.     Right Ear: External ear normal.     Left Ear: External ear normal.  Eyes:     Extraocular Movements: Extraocular movements intact.     Pupils: Pupils are equal, round, and reactive to light.  Cardiovascular:     Rate and Rhythm: Normal rate and regular rhythm.     Heart sounds: Normal heart sounds. No murmur heard.    No gallop.     Comments: Mechanical heart valve click noted Pulmonary:     Effort: Pulmonary effort is normal. No respiratory distress.     Breath sounds: Normal breath sounds. No wheezing or rales.  Musculoskeletal:     Right lower leg: 2+ Edema present.     Left lower leg: 2+ Edema present.     Comments:  5/5 strength in both upper and lower extremities  Skin:    General: Skin is warm and dry.  Neurological:     Mental Status: He is alert and oriented to person, place, and time.     Comments: All cranial nerves are intact  Psychiatric:        Judgment: Judgment normal.     BP (!) 100/59 (BP Location: Right Arm, Patient Position: Sitting, Cuff Size: Large)   Pulse 69   Temp 98.3 F (36.8 C) (Oral)   Resp 16   Wt 215 lb (97.5 kg)   SpO2 100%   BMI 31.75 kg/m  Wt Readings from Last 3 Encounters:  02/06/22 215 lb (97.5 kg)  01/24/22 224 lb 6.9 oz (101.8 kg)  01/06/22 219 lb (99.3 kg)       Assessment & Plan:  Acute ischemic stroke (HCC)  Assessment & Plan: No residual weakness from most recent stroke.     Type 2 diabetes mellitus with other circulatory complication, with long-term current use of insulin [facility]) Assessment & Plan: Lab Results  Component Value Date   HGBA1C 8.2 (H) 01/24/2022   HGBA1C 7.7 (A) 10/28/2021   HGBA1C 8.3 (H) 06/25/2021   Lab Results  Component Value Date   MICROALBUR <0.7 10/04/2021   LDLCALC UNABLE TO CALCULATE IF TRIGLYCERIDE OVER 400 mg/dL 13/08/6576   CREATININE 1.32 (H) 01/27/2022   DM is being managed by endocrinology.     Dyslipidemia Assessment & Plan: Lab Results  Component Value Date   CHOL 119 01/25/2022   HDL 25 (L) 01/25/2022   LDLCALC UNABLE TO CALCULATE IF TRIGLYCERIDE OVER 400 mg/dL 46/96/2952   LDLDIRECT 53 01/26/2022   TRIG 604 (H) 01/25/2022   CHOLHDL 4.8 01/25/2022  Maintained on tricor and crestor 40mg .    Essential hypertension Assessment & Plan: BP Readings from Last 3 Encounters:  02/06/22 (!) 100/59  01/27/22 130/73  01/06/22 124/62   BP stable on metoprolol and amlodipine, continue same.    Hypertriglyceridemia Assessment & Plan: He is on max dose of fenofibrate and crestor.  Continue to work on diet and plan repeat FLP next visit.    Presence of prosthetic heart valve Assessment &  Plan: Goal INR 2.5 to 3.5. Management per coumadin clinic. He was sent home on a lovenox bridge but this has been discontinued by the coumadin clinic.    Type 2 diabetes mellitus with diabetic polyneuropathy, with long-term current use of insulin [facility]) Assessment & Plan: Lab Results  Component Value Date   HGBA1C 8.2 (H) 01/24/2022   HGBA1C 7.7 (A) 10/28/2021   HGBA1C 8.3 (H) 06/25/2021   Lab Results  Component Value Date   MICROALBUR <0.7 10/04/2021   LDLCALC UNABLE TO CALCULATE IF TRIGLYCERIDE OVER 400 mg/dL 84/13/2440   CREATININE 1.32 (H) 01/27/2022   This is being managed by endocrinology.      I, [name], NP, personally preformed the services described in this documentation.  All medical record entries made by the scribe were at my direction and in my presence.  I have reviewed the chart and discharge instructions (if applicable) and agree that the record reflects my personal performance and is accurate and complete. 02/06/2022   I,[name],acting as a Neurosurgeon for [name], NP.,have documented all relevant documentation on the behalf of [name], NP,as directed by  [name], NP while in the presence of [name], NP.   [name], NP

## 2022-02-06 NOTE — Assessment & Plan Note (Signed)
No residual weakness from most recent stroke.

## 2022-02-06 NOTE — Progress Notes (Unsigned)
Subjective:     Patient ID: [name], male    DOB: January 05, 1946, 77 y.o.   MRN: [id number]  Chief Complaint  Patient presents with   Follow-up    Here for hospital follow up after stroke     HPI Patient is in today for ***  Health Maintenance Due  Topic Date Due   Zoster Vaccines- Shingrix (1 of 2) Never done   Medicare Annual Wellness (AWV)  01/18/2017   COVID-19 Vaccine (5 - 2023-24 season) 09/30/2021    Past Medical History:  Diagnosis Date   Actinic keratosis 02/14/2011   Allergy    Aneurysm of ascending aorta without rupture ([place]) 01/06/2022   Aortic stenosis    Arthritis    Bilateral sensorineural hearing loss 06/16/2009   CAD (coronary artery disease)    cabg   Complication of anesthesia    Coronary atherosclerosis of native coronary artery 06/15/2009   Diabetic polyneuropathy 11/11/2018   Dyslipidemia 78/29/5621   Embolic stroke involving left middle cerebral artery  05/30/2021   Erectile dysfunction 05/18/2009   Essential hypertension 06/16/2009   Expressive aphasia 06/25/2021   External hemorrhoids 03/21/2010   Gout 04/09/2015   Heart murmur    Hemochromatosis 02/14/2011   History of aortic valve replacement 10/13/2009   History of CABG 11/05/2018   2005- 1. Coronary artery bypass grafting x4 with LIMA-LAD, SVG-OM1, SVG-AM and dCFX.  2. Aortic valve replacement with St Jude AVR 3. Septal myomectomy.  4. Myoview low risk 2016   History of hepatitis 01/31/1972   Hypertriglyceridemia 06/13/2010   Hypertrophic obstructive cardiomyopathy 05/06/2008   SP septal myomectomy at surgery 2005 Echo Nov 2018- Hyperdynamic LVEF with severe basal septal hypertrophy. There is chordal SAM with resting gradient of 16 mmHg that increases to 85 mmHg with Valsalva        Leg weakness, bilateral 01/18/2021   Long term (current) use of anticoagulants 05/22/2018   Lumbar stenosis with neurogenic claudication 02/04/2021   Malignant neoplasm of prostate 06/17/2009    Mechanical heart valve present    Manufacturer: Sherlean Foot #: [id number]  Model #: [id number]. Card states MRI compatible with 3 teslas or less.   Obesity, unspecified 04/29/2008   OSA (obstructive sleep apnea) 07/12/2012   moderate-severe; uses CPAP nightly   PONV (postoperative nausea and vomiting)    after CABG- slow to wake up   Presence of prosthetic heart valve    Primary osteoarthritis of left shoulder 10/31/2018   S/P lumbar laminectomy 02/04/2021   Stage 3 chronic kidney disease due to type 2 diabetes mellitus 11/11/2018   Tobacco use 06/16/2009   Type 2 diabetes mellitus with hyperglycemia    Ventral hernia 03/03/2014    Past Surgical History:  Procedure Laterality Date   AORTIC VALVE REPLACEMENT  11/14/2003   [facility]   APPENDECTOMY  [place]   CORONARY ANGIOGRAPHY  10/11/2021   CORONARY ARTERY BYPASS GRAFT  10/2003   [place]   right, inguinal   HERNIA REPAIR  2002   left, inguinal   HIP SURGERY  2006   right hip   IR CT HEAD LTD  02/03/2022   IR PERCUTANEOUS ART THROMBECTOMY/INFUSION INTRACRANIAL INC DIAG ANGIO  01/24/2022   LUMBAR LAMINECTOMY/DECOMPRESSION MICRODISCECTOMY Bilateral 02/04/2021   Procedure: Bilateral Lumbar Two-Three Laminectomy;  Surgeon: Kristeen Miss, MD;  Location: [facility];  Service: Neurosurgery;  Laterality: Bilateral;  3C/RM [place]  prostate seed implant  03/2010   RADIOLOGY WITH ANESTHESIA N/A 01/24/2022   Procedure: IR WITH ANESTHESIA;  Surgeon: Radiologist, Medication, MD;  Location: [facility];  Service: Radiology;  Laterality: N/A;   TONSILLECTOMY  childhood    Family History  Problem Relation Age of Onset   Heart disease Mother    Stroke Mother    Heart disease Father    Asperger's syndrome Son    Hyperlipidemia Son    Coronary artery disease Brother    Cancer Neg Hx        negative for colon cancer    Social History   Socioeconomic History   Marital status:  Married    Spouse name: Not on file   Number of children: Not on file   Years of education: 18   Highest education level: Master's degree (e.g., MA, MS, MEng, MEd, MSW, MBA)  Occupational History   Occupation: Retired  Tobacco Use   Smoking status: Not on file   Smokeless tobacco: Never   Tobacco comments:    Pt states he smokes 1 cigar about once a month. 06/01/2021    Pt quit  Cigars 06/2021  Substance and Sexual Activity   Alcohol use: Not Currently    Comment: wine maybe 1 per month   Drug use: No   Sexual activity: Never  Other Topics Concern   Not on file  Social History Narrative   ** Merged History Encounter **       Right Handed  Lives in a one sotyr home. One step leads to front door.    Social Determinants of Health   Financial Resource Strain: Not on file  Food Insecurity: Not on file  Transportation Needs: No Transportation Needs (01/31/2022)   PRAPARE - Hydrologist (Medical): No    Lack of Transportation (Non-Medical): No  Physical Activity: Not on file  Stress: Not on file  Social Connections: Not on file  Intimate Partner Violence: Not on file    Outpatient Medications Prior to Visit  Medication Sig Dispense Refill   AMBULATORY NON FORMULARY MEDICATION cpap cushions            AirFit F20 (Size: Large)           AirFit F30 (Size: Med)   Please FAX the prescription to:  [phone] 12 each prn   amLODipine (NORVASC) 5 MG tablet TAKE 1 TABLET (5 MG TOTAL) BY MOUTH DAILY. 90 tablet 1   betamethasone dipropionate (DIPROLENE) 0.05 % cream Apply topically 2 (two) times daily as needed. To eczema rash 30 g 1   Blood Glucose Monitoring Suppl Supplies MISC Use for monitoring glucose level 100 each 1   clopidogrel (PLAVIX) 75 MG tablet TAKE 1 TABLET BY MOUTH EVERY DAY 90 tablet 1   colchicine 0.6 MG tablet TAKE 1 TABLET (0.6 MG TOTAL) BY MOUTH 2 (TWO) TIMES DAILY AS NEEDED. 180 tablet 0   Continuous Blood Gluc Sensor (DEXCOM G6  SENSOR) MISC 1 Device by Does not apply route as directed. 9 each 3   dapagliflozin propanediol (FARXIGA) 10 MG TABS tablet Take 1 tablet (10 mg total) by mouth daily before breakfast. 90 tablet 3   fenofibrate (TRICOR) 145 MG tablet TAKE 1 TABLET BY MOUTH EVERY DAY 90 tablet 1   furosemide (LASIX) 20 MG tablet TAKE 1 TABLET BY MOUTH EVERY DAY AS NEEDED FOR SWELLING 90 tablet 1   glucose blood (ONETOUCH VERIO) test strip Use as instructed 100 strip 12  insulin aspart (NOVOLOG FLEXPEN) 100 UNIT/ML FlexPen Max daily 70 units 75 mL 3   Insulin Glargine (BASAGLAR KWIKPEN) 100 UNIT/ML Inject 22 Units into the skin daily. 30 mL 3   Insulin Pen Needle 32G X 4 MM MISC 1 Device by Does not apply route in the morning, at noon, in the evening, and at bedtime. 400 each 3   isosorbide mononitrate (IMDUR) 30 MG 24 hr tablet Take 30 mg by mouth daily.     metoprolol succinate (TOPROL-XL) 25 MG 24 hr tablet TAKE 1 TABLET BY MOUTH EVERY DAY 90 tablet 1   NOVOLOG FLEXPEN 100 UNIT/ML FlexPen Inject 14 Units into the skin 3 (three) times daily with meals.     OneTouch Delica Lancets 27C MISC USE AS DIRECTED 100 each 1   rosuvastatin (CRESTOR) 40 MG tablet TAKE 1 TABLET BY MOUTH EVERY DAY 90 tablet 3   tacrolimus (PROTOPIC) 0.1 % ointment Apply 1 application  topically daily as needed (irritation).     tamsulosin (FLOMAX) 0.4 MG CAPS capsule Take 1 capsule (0.4 mg total) by mouth daily. 30 capsule 0   warfarin (COUMADIN) 7.5 MG tablet TAKE 1/2 A TABLET TO 1 TABLET BY MOUTH DAILY AS DIRECTED BY THE COUMADIN CLINIC 105 tablet 1   amLODipine (NORVASC) 5 MG tablet Take 5 mg by mouth daily.     clopidogrel (PLAVIX) 75 MG tablet Take 75 mg by mouth daily.     colchicine 0.6 MG tablet Take 0.6 mg by mouth 2 (two) times daily as needed (for gout).     Continuous Blood Gluc Transmit (DEXCOM G6 TRANSMITTER) MISC 1 Device by Does not apply route as directed. 1 each 3   enoxaparin (LOVENOX) 100 MG/ML injection Inject 1 mL (100  mg total) into the skin 2 (two) times daily. 12 mL 0   fenofibrate (TRICOR) 145 MG tablet Take 145 mg by mouth daily.     furosemide (LASIX) 20 MG tablet Take 20 mg by mouth daily.     Insulin Glargine (BASAGLAR KWIKPEN) 100 UNIT/ML Inject 22 Units into the skin at bedtime.     isosorbide mononitrate (IMDUR) 30 MG 24 hr tablet TAKE 1 TABLET BY MOUTH EVERY DAY 90 tablet 1   metoprolol succinate (TOPROL-XL) 25 MG 24 hr tablet Take 25 mg by mouth daily.     rosuvastatin (CRESTOR) 40 MG tablet Take 40 mg by mouth daily.     warfarin (COUMADIN) 7.5 MG tablet Take 3.75-7.5 mg by mouth daily. Take 3.75 mg  on Monday wed and Fridays and then take 7.5 rest of days per patient     No facility-administered medications prior to visit.    No Known Allergies  ROS     Objective:    Physical Exam  BP (!) 100/59 (BP Location: Right Arm, Patient Position: Sitting, Cuff Size: Large)   Pulse 69   Temp 98.3 F (36.8 C) (Oral)   Resp 16   Wt 215 lb (97.5 kg)   SpO2 100%   BMI 31.75 kg/m  Wt Readings from Last 3 Encounters:  02/06/22 215 lb (97.5 kg)  01/24/22 224 lb 6.9 oz (101.8 kg)  01/06/22 219 lb (99.3 kg)       Assessment & Plan:   Problem List Items Addressed This Visit       Unprioritized   Essential hypertension (Chronic)    BP Readings from Last 3 Encounters:  02/06/22 (!) 100/59  01/27/22 130/73  01/06/22 124/62  BP stable on metoprolol and  amlodipine, continue same.       Dyslipidemia (Chronic)    Lab Results  Component Value Date   CHOL 119 01/25/2022   HDL 25 (L) 01/25/2022   LDLCALC UNABLE TO CALCULATE IF TRIGLYCERIDE OVER 400 mg/dL 01/25/2022   LDLDIRECT 53 01/26/2022   TRIG 604 (H) 01/25/2022   CHOLHDL 4.8 01/25/2022  Maintained on tricor and crestor '40mg'$ .       Diabetes mellitus ([place])    Lab Results  Component Value Date   HGBA1C 8.2 (H) 01/24/2022   HGBA1C 7.7 (A) 10/28/2021   HGBA1C 8.3 (H) 06/25/2021   Lab Results  Component Value Date    MICROALBUR <0.7 10/04/2021   LDLCALC UNABLE TO CALCULATE IF TRIGLYCERIDE OVER 400 mg/dL 01/25/2022   CREATININE 1.32 (H) 01/27/2022  DM is being managed by endocrinology.        Acute ischemic stroke ([place]) - Primary    No residual weakness from most recent stroke.         I am having Khayri Kargbo. Dunigan "Len" maintain his Blood Glucose Monitoring Suppl, betamethasone dipropionate, AMBULATORY NON FORMULARY MEDICATION, Dexcom G6 Sensor, OneTouch Verio, OneTouch Delica Lancets 12I, tacrolimus, dapagliflozin propanediol, warfarin, Basaglar KwikPen, Insulin Pen Needle, NovoLOG FlexPen, furosemide, metoprolol succinate, fenofibrate, colchicine, rosuvastatin, amLODipine, clopidogrel, isosorbide mononitrate, NovoLOG FlexPen, and tamsulosin.  No orders of the defined types were placed in this encounter.

## 2022-02-06 NOTE — Assessment & Plan Note (Addendum)
Lab Results  Component Value Date   HGBA1C 8.2 (H) 01/24/2022   HGBA1C 7.7 (A) 10/28/2021   HGBA1C 8.3 (H) 06/25/2021   Lab Results  Component Value Date   MICROALBUR <0.7 10/04/2021   LDLCALC UNABLE TO CALCULATE IF TRIGLYCERIDE OVER 400 mg/dL 01/25/2022   CREATININE 1.32 (H) 01/27/2022   DM is being managed by endocrinology.

## 2022-02-06 NOTE — Progress Notes (Signed)
   HPI: Followup of coronary artery disease. Patient is status post coronary artery bypassing graft (LIMA to the LAD, saphenous vein graft to the marginal, saphenous vein graft sequentially to the right coronary artery and distal circumflex) as well as aortic valve replacement (St. Jude aortic valve) in 2005. He also had a septal myectomy at that time. Abdominal CT April 2015 showed no aneurysm. Carotid Dopplers November 2022 showed no significant stenosis.  Had CVA May 2023. Patient had non-ST elevation myocardial infarction September 2023 in [place].  Patient presented with atrial fibrillation at that time.  Cardiac catheterization revealed severe three-vessel coronary artery disease; patent LIMA to the LAD with 80% stenosis in the very distal LAD; occluded saphenous vein graft to the left circumflex/right coronary artery and occluded saphenous vein graft to the OM; treated medically.  Echocardiogram showed normal LV function, grade 1 diastolic dysfunction, mild mitral regurgitation, mild tricuspid regurgitation, status post aortic valve replacement with mean gradient across the aortic valve of 8 mmHg and dilated aorta at 4.4 cm.  CTA October 2023 showed 4.3 cm ascending aortic aneurysm, 7 mm right lower lobe pulmonary nodule with follow-up recommended 6 to 12 months.  Patient had CVA December 2023.  CTA showed occlusion of left M1.  He had mechanical thrombectomy.  Echocardiogram December 2023 showed normal LV function, moderate left atrial enlargement, mechanical aortic valve replacement with mean gradient 8 mmHg and no aortic insufficiency, severely dilated ascending aorta at 46 mm.  Event was felt likely thrombotic due to mechanical heart valve (INR on admission was 2.2).  Since last seen, he denies dyspnea, chest pain, palpitations or syncope.  No pedal edema.  Current Outpatient Medications  Medication Sig Dispense Refill   AMBULATORY NON FORMULARY MEDICATION cpap cushions            AirFit  F20 (Size: Large)           AirFit F30 (Size: Med)   Please FAX the prescription to:  [phone] 12 each prn   amLODipine (NORVASC) 5 MG tablet TAKE 1 TABLET (5 MG TOTAL) BY MOUTH DAILY. 90 tablet 1   betamethasone dipropionate (DIPROLENE) 0.05 % cream Apply topically 2 (two) times daily as needed. To eczema rash 30 g 1   Blood Glucose Monitoring Suppl Supplies MISC Use for monitoring glucose level 100 each 1   cephALEXin (KEFLEX) 500 MG capsule Take 500 mg by mouth 2 (two) times daily.     clopidogrel (PLAVIX) 75 MG tablet TAKE 1 TABLET BY MOUTH EVERY DAY 90 tablet 1   colchicine 0.6 MG tablet TAKE 1 TABLET (0.6 MG TOTAL) BY MOUTH 2 (TWO) TIMES DAILY AS NEEDED. 180 tablet 0   Continuous Blood Gluc Sensor (DEXCOM G6 SENSOR) MISC 1 Device by Does not apply route as directed. 9 each 3   dapagliflozin propanediol (FARXIGA) 10 MG TABS tablet Take 1 tablet (10 mg total) by mouth daily before breakfast. 90 tablet 3   fenofibrate (TRICOR) 145 MG tablet TAKE 1 TABLET BY MOUTH EVERY DAY 90 tablet 1   furosemide (LASIX) 20 MG tablet TAKE 1 TABLET BY MOUTH EVERY DAY AS NEEDED FOR SWELLING 90 tablet 1   glucose blood (ONETOUCH VERIO) test strip Use as instructed 100 strip 12   insulin aspart (NOVOLOG FLEXPEN) 100 UNIT/ML FlexPen Max daily 75 units 75 mL 3   Insulin Glargine (BASAGLAR KWIKPEN) 100 UNIT/ML Inject 22 Units into the skin daily. 30 mL 3   Insulin Pen Needle 32G X 4 MM MISC 1   Device by Does not apply route in the morning, at noon, in the evening, and at bedtime. 400 each 3   isosorbide mononitrate (IMDUR) 30 MG 24 hr tablet Take 30 mg by mouth daily.     metoprolol succinate (TOPROL-XL) 25 MG 24 hr tablet TAKE 1 TABLET BY MOUTH EVERY DAY 90 tablet 1   OneTouch Delica Lancets 33G MISC USE AS DIRECTED 100 each 1   rosuvastatin (CRESTOR) 40 MG tablet TAKE 1 TABLET BY MOUTH EVERY DAY 90 tablet 3   tacrolimus (PROTOPIC) 0.1 % ointment Apply 1 application  topically daily as needed (irritation).      tamsulosin (FLOMAX) 0.4 MG CAPS capsule Take 1 capsule (0.4 mg total) by mouth daily. 30 capsule 0   warfarin (COUMADIN) 7.5 MG tablet TAKE 1/2 A TABLET TO 1 TABLET BY MOUTH DAILY AS DIRECTED BY THE COUMADIN CLINIC 105 tablet 1   No current facility-administered medications for this visit.     Past Medical History:  Diagnosis Date   Actinic keratosis 02/14/2011   Allergy    Aneurysm of ascending aorta without rupture ([facility]) 01/06/2022   Aortic stenosis    Arthritis    Bilateral sensorineural hearing loss 06/16/2009   CAD (coronary artery disease)    cabg   Complication of anesthesia    Coronary atherosclerosis of native coronary artery 06/15/2009   Diabetic polyneuropathy 11/11/2018   Dyslipidemia 11/05/2018   Embolic stroke involving left middle cerebral artery  05/30/2021   Erectile dysfunction 05/18/2009   Essential hypertension 06/16/2009   Expressive aphasia 06/25/2021   External hemorrhoids 03/21/2010   Gout 04/09/2015   Heart murmur    Hemochromatosis 02/14/2011   History of aortic valve replacement 10/13/2009   History of CABG 11/05/2018   2005- 1. Coronary artery bypass grafting x4 with LIMA-LAD, SVG-OM1, SVG-AM and dCFX.  2. Aortic valve replacement with St Jude AVR 3. Septal myomectomy.  4. Myoview low risk 2016   History of hepatitis 01/31/1972   Hypertriglyceridemia 06/13/2010   Hypertrophic obstructive cardiomyopathy 05/06/2008   SP septal myomectomy at surgery 2005 Echo Nov 2018- Hyperdynamic LVEF with severe basal septal hypertrophy. There is chordal SAM with resting gradient of 16 mmHg that increases to 85 mmHg with Valsalva        Leg weakness, bilateral 01/18/2021   Long term (current) use of anticoagulants 05/22/2018   Lumbar stenosis with neurogenic claudication 02/04/2021   Malignant neoplasm of prostate 06/17/2009   Mechanical heart valve present    Manufacturer: St, Judes   Seriel #: [id number]  Model #: [id number]. Card states MRI compatible with 3  teslas or less.   Obesity, unspecified 04/29/2008   OSA (obstructive sleep apnea) 07/12/2012   moderate-severe; uses CPAP nightly   PONV (postoperative nausea and vomiting)    after CABG- slow to wake up   Presence of prosthetic heart valve    Primary osteoarthritis of left shoulder 10/31/2018   S/P lumbar laminectomy 02/04/2021   Stage 3 chronic kidney disease due to type 2 diabetes mellitus 11/11/2018   Tobacco use 06/16/2009   Type 2 diabetes mellitus with hyperglycemia    Ventral hernia 03/03/2014    Past Surgical History:  Procedure Laterality Date   AORTIC VALVE REPLACEMENT  11/14/2003   [facility]   APPENDECTOMY  1990   CHOLECYSTECTOMY  1990   CORONARY ANGIOGRAPHY  10/11/2021   CORONARY ARTERY BYPASS GRAFT  10/2003   HERNIA REPAIR  1999   right, inguinal   HERNIA REPAIR  2002     left, inguinal   HIP SURGERY  2006   right hip   IR CT HEAD LTD  02/03/2022   IR PERCUTANEOUS ART THROMBECTOMY/INFUSION INTRACRANIAL INC DIAG ANGIO  01/24/2022   LUMBAR LAMINECTOMY/DECOMPRESSION MICRODISCECTOMY Bilateral 02/04/2021   Procedure: Bilateral Lumbar Two-Three Laminectomy;  Surgeon: Elsner, Henry, MD;  Location: [facility];  Service: Neurosurgery;  Laterality: Bilateral;  3C/RM 20   PILONIDAL CYST EXCISION  1964   prostate seed implant  03/2010   RADIOLOGY WITH ANESTHESIA N/A 01/24/2022   Procedure: IR WITH ANESTHESIA;  Surgeon: Radiologist, Medication, MD;  Location: [facility];  Service: Radiology;  Laterality: N/A;   TONSILLECTOMY  childhood    Social History   Socioeconomic History   Marital status: Married    Spouse name: Not on file   Number of children: Not on file   Years of education: 18   Highest education level: Master's degree (e.g., MA, MS, MEng, MEd, MSW, MBA)  Occupational History   Occupation: Retired  Tobacco Use   Smoking status: Some Days    Types: Cigars    Last attempt to quit: 06/2021    Years since quitting: 0.6   Smokeless tobacco: Never   Tobacco  comments:    Pt states he smokes 1 cigar about once a month. 06/01/2021    Pt quit  Cigars 06/2021  Substance and Sexual Activity   Alcohol use: Not Currently    Comment: wine maybe 1 per month   Drug use: No   Sexual activity: Never  Other Topics Concern   Not on file  Social History Narrative   ** Merged History Encounter **       Right Handed  Lives in a one sotyr home. One step leads to front door.    Social Determinants of Health   Financial Resource Strain: Not on file  Food Insecurity: No Food Insecurity (02/10/2022)   Hunger Vital Sign    Worried About Running Out of Food in the Last Year: Never true    Ran Out of Food in the Last Year: Never true  Transportation Needs: No Transportation Needs (02/10/2022)   PRAPARE - Transportation    Lack of Transportation (Medical): No    Lack of Transportation (Non-Medical): No  Physical Activity: Not on file  Stress: Not on file  Social Connections: Not on file  Intimate Partner Violence: Not on file    Family History  Problem Relation Age of Onset   Heart disease Mother    Stroke Mother    Heart disease Father    Asperger's syndrome Son    Hyperlipidemia Son    Coronary artery disease Brother    Cancer Neg Hx        negative for colon cancer    ROS: no fevers or chills, productive cough, hemoptysis, dysphasia, odynophagia, melena, hematochezia, dysuria, hematuria, rash, seizure activity, orthopnea, PND, pedal edema, claudication. Remaining systems are negative.  Physical Exam: Well-developed well-nourished in no acute distress.  Skin is warm and dry.  HEENT is normal.  Neck is supple.  Chest is clear to auscultation with normal expansion.  Cardiovascular exam is regular rate and rhythm.  Abdominal exam nontender or distended. No masses palpated. Extremities show no edema. neuro grossly intact  ECG-normal sinus rhythm at a rate of 61, normal axis, anterior T wave inversion.  Repeat immediately showed sinus rhythm  with left bundle branch block.  Personally reviewed  A/P  1 status post aortic valve replacement-continue SBE prophylaxis.  Continue Coumadin with goal   INR 2.5-3.5.  Given recent CVA he was also placed on Plavix in addition to his Coumadin.  Patient had a CVA in May and then again in December.  I will arrange a transesophageal echocardiogram to assess his aortic valve and rule out source of embolus.  2 thoracic aortic aneurysm-plan follow-up CTA October 2024.  3 lung nodule-plan follow-up CT as outlined October 2024.  4 coronary artery disease-no chest pain.  Continue statin.  We previously reviewed patient's cardiac catheterization films from [place] (I reviewed with Dr. Kelly).  Best option is felt to be medical therapy.  5 hypertension-patient's blood pressure is controlled.  Continue present medications and follow.  6 hyperlipidemia-continue statin.  7 history of hypertrophic cardiomyopathy-status post myectomy.  No gradients noted on follow-up echocardiogram.  Continue beta-blocker.  8 paroxysmal atrial fibrillation-patient remains in sinus rhythm.  Continue beta-blocker and Coumadin.  As outlined previously this was felt to be the cause of his non-ST elevation myocardial infarction in [place] due to demand ischemia with elevated heart rate.  If he has more frequent episodes in the future we will consider antiarrhythmic.  Vladimir Lenhoff, MD    

## 2022-02-06 NOTE — H&P (View-Only) (Signed)
HPI: Followup of coronary artery disease. Patient is status post coronary artery bypassing graft (LIMA to the LAD, saphenous vein graft to the marginal, saphenous vein graft sequentially to the right coronary artery and distal circumflex) as well as aortic valve replacement (St. Jude aortic valve) in 2005. He also had a septal myectomy at that time. Abdominal CT April 2015 showed no aneurysm. Carotid Dopplers November 2022 showed no significant stenosis.  Had CVA May 2023. Patient had non-ST elevation myocardial infarction September 2023 in [place].  Patient presented with atrial fibrillation at that time.  Cardiac catheterization revealed severe three-vessel coronary artery disease; patent LIMA to the LAD with 80% stenosis in the very distal LAD; occluded saphenous vein graft to the left circumflex/right coronary artery and occluded saphenous vein graft to the OM; treated medically.  Echocardiogram showed normal LV function, grade 1 diastolic dysfunction, mild mitral regurgitation, mild tricuspid regurgitation, status post aortic valve replacement with mean gradient across the aortic valve of 8 mmHg and dilated aorta at 4.4 cm.  CTA October 2023 showed 4.3 cm ascending aortic aneurysm, 7 mm right lower lobe pulmonary nodule with follow-up recommended 6 to 12 months.  Patient had CVA December 2023.  CTA showed occlusion of left M1.  He had mechanical thrombectomy.  Echocardiogram December 2023 showed normal LV function, moderate left atrial enlargement, mechanical aortic valve replacement with mean gradient 8 mmHg and no aortic insufficiency, severely dilated ascending aorta at 46 mm.  Event was felt likely thrombotic due to mechanical heart valve (INR on admission was 2.2).  Since last seen, he denies dyspnea, chest pain, palpitations or syncope.  No pedal edema.  Current Outpatient Medications  Medication Sig Dispense Refill   AMBULATORY NON FORMULARY MEDICATION cpap cushions            AirFit  F20 (Size: Large)           AirFit F30 (Size: Med)   Please FAX the prescription to:  [phone] each prn   amLODipine (NORVASC) 5 MG tablet TAKE 1 TABLET (5 MG TOTAL) BY MOUTH DAILY. 90 tablet 1   betamethasone dipropionate (DIPROLENE) 0.05 % cream Apply topically 2 (two) times daily as needed. To eczema rash 30 g 1   Blood Glucose Monitoring Suppl Supplies MISC Use for monitoring glucose level 100 each 1   cephALEXin (KEFLEX) 500 MG capsule Take 500 mg by mouth 2 (two) times daily.     clopidogrel (PLAVIX) 75 MG tablet TAKE 1 TABLET BY MOUTH EVERY DAY 90 tablet 1   colchicine 0.6 MG tablet TAKE 1 TABLET (0.6 MG TOTAL) BY MOUTH 2 (TWO) TIMES DAILY AS NEEDED. 180 tablet 0   Continuous Blood Gluc Sensor (DEXCOM G6 SENSOR) MISC 1 Device by Does not apply route as directed. 9 each 3   dapagliflozin propanediol (FARXIGA) 10 MG TABS tablet Take 1 tablet (10 mg total) by mouth daily before breakfast. 90 tablet 3   fenofibrate (TRICOR) 145 MG tablet TAKE 1 TABLET BY MOUTH EVERY DAY 90 tablet 1   furosemide (LASIX) 20 MG tablet TAKE 1 TABLET BY MOUTH EVERY DAY AS NEEDED FOR SWELLING 90 tablet 1   glucose blood (ONETOUCH VERIO) test strip Use as instructed 100 strip 12   insulin aspart (NOVOLOG FLEXPEN) 100 UNIT/ML FlexPen Max daily 75 units 75 mL 3   Insulin Glargine (BASAGLAR KWIKPEN) 100 UNIT/ML Inject 22 Units into the skin daily. 30 mL 3   Insulin Pen Needle 32G X 4 MM MISC 1  Device by Does not apply route in the morning, at noon, in the evening, and at bedtime. 400 each 3   isosorbide mononitrate (IMDUR) 30 MG 24 hr tablet Take 30 mg by mouth daily.     metoprolol succinate (TOPROL-XL) 25 MG 24 hr tablet TAKE 1 TABLET BY MOUTH EVERY DAY 90 tablet 1   OneTouch Delica Lancets 99991111 MISC USE AS DIRECTED 100 each 1   rosuvastatin (CRESTOR) 40 MG tablet TAKE 1 TABLET BY MOUTH EVERY DAY 90 tablet 3   tacrolimus (PROTOPIC) 0.1 % ointment Apply 1 application  topically daily as needed (irritation).      tamsulosin (FLOMAX) 0.4 MG CAPS capsule Take 1 capsule (0.4 mg total) by mouth daily. 30 capsule 0   warfarin (COUMADIN) 7.5 MG tablet TAKE 1/2 A TABLET TO 1 TABLET BY MOUTH DAILY AS DIRECTED BY THE COUMADIN CLINIC 105 tablet 1   No current facility-administered medications for this visit.     Past Medical History:  Diagnosis Date   Actinic keratosis 02/14/2011   Allergy    Aneurysm of ascending aorta without rupture ([place]) 01/06/2022   Aortic stenosis    Arthritis    Bilateral sensorineural hearing loss 06/16/2009   CAD (coronary artery disease)    cabg   Complication of anesthesia    Coronary atherosclerosis of native coronary artery 06/15/2009   Diabetic polyneuropathy 11/11/2018   Dyslipidemia [id number]   Embolic stroke involving left middle cerebral artery  05/30/2021   Erectile dysfunction 05/18/2009   Essential hypertension 06/16/2009   Expressive aphasia 06/25/2021   External hemorrhoids 03/21/2010   Gout 04/09/2015   Heart murmur    Hemochromatosis 02/14/2011   History of aortic valve replacement 10/13/2009   History of CABG 11/05/2018   2005- 1. Coronary artery bypass grafting x4 with LIMA-LAD, SVG-OM1, SVG-AM and dCFX.  2. Aortic valve replacement with St Jude AVR 3. Septal myomectomy.  4. Myoview low risk 2016   History of hepatitis 01/31/1972   Hypertriglyceridemia 06/13/2010   Hypertrophic obstructive cardiomyopathy 05/06/2008   SP septal myomectomy at surgery 2005 Echo Nov 2018- Hyperdynamic LVEF with severe basal septal hypertrophy. There is chordal SAM with resting gradient of 16 mmHg that increases to 85 mmHg with Valsalva        Leg weakness, bilateral 01/18/2021   Long term (current) use of anticoagulants 05/22/2018   Lumbar stenosis with neurogenic claudication 02/04/2021   Malignant neoplasm of prostate 06/17/2009   Mechanical heart valve present    Manufacturer: Sherlean Foot #: [id number]  Model #: [id number]. Card states MRI compatible with 3  teslas or less.   Obesity, unspecified 04/29/2008   OSA (obstructive sleep apnea) 07/12/2012   moderate-severe; uses CPAP nightly   PONV (postoperative nausea and vomiting)    after CABG- slow to wake up   Presence of prosthetic heart valve    Primary osteoarthritis of left shoulder 10/31/2018   S/P lumbar laminectomy 02/04/2021   Stage 3 chronic kidney disease due to type 2 diabetes mellitus 11/11/2018   Tobacco use 06/16/2009   Type 2 diabetes mellitus with hyperglycemia    Ventral hernia 03/03/2014    Past Surgical History:  Procedure Laterality Date   AORTIC VALVE REPLACEMENT  11/14/2003   St Jude [place]   CORONARY ANGIOGRAPHY  10/11/2021   CORONARY ARTERY BYPASS GRAFT  10/2003   [facility]   right, inguinal   HERNIA REPAIR  2002  left, inguinal   HIP SURGERY  2006   right hip   IR CT HEAD LTD  02/03/2022   IR PERCUTANEOUS ART THROMBECTOMY/INFUSION INTRACRANIAL INC DIAG ANGIO  01/24/2022   LUMBAR LAMINECTOMY/DECOMPRESSION MICRODISCECTOMY Bilateral 02/04/2021   Procedure: Bilateral Lumbar Two-Three Laminectomy;  Surgeon: Kristeen Miss, MD;  Location: [facility];  Service: Neurosurgery;  Laterality: Bilateral;  3C/RM 20   PILONIDAL CYST EXCISION  1964   prostate seed implant  03/2010   RADIOLOGY WITH ANESTHESIA N/A 01/24/2022   Procedure: IR WITH ANESTHESIA;  Surgeon: Radiologist, Medication, MD;  Location: [facility];  Service: Radiology;  Laterality: N/A;   TONSILLECTOMY  childhood    Social History   Socioeconomic History   Marital status: Married    Spouse name: Not on file   Number of children: Not on file   Years of education: 10   Highest education level: Master's degree (e.g., MA, MS, MEng, MEd, MSW, MBA)  Occupational History   Occupation: Retired  Tobacco Use   Smoking status: Some Days    Types: Cigars    Last attempt to quit: 06/2021    Years since quitting: 0.6   Smokeless tobacco: Never   Tobacco  comments:    Pt states he smokes 1 cigar about once a month. 06/01/2021    Pt quit  Cigars 06/2021  Substance and Sexual Activity   Alcohol use: Not Currently    Comment: wine maybe 1 per month   Drug use: No   Sexual activity: Never  Other Topics Concern   Not on file  Social History Narrative   ** Merged History Encounter **       Right Handed  Lives in a one sotyr home. One step leads to front door.    Social Determinants of Health   Financial Resource Strain: Not on file  Food Insecurity: No Food Insecurity (02/10/2022)   Hunger Vital Sign    Worried About Running Out of Food in the Last Year: Never true    Ran Out of Food in the Last Year: Never true  Transportation Needs: No Transportation Needs (02/10/2022)   PRAPARE - Hydrologist (Medical): No    Lack of Transportation (Non-Medical): No  Physical Activity: Not on file  Stress: Not on file  Social Connections: Not on file  Intimate Partner Violence: Not on file    Family History  Problem Relation Age of Onset   Heart disease Mother    Stroke Mother    Heart disease Father    Asperger's syndrome Son    Hyperlipidemia Son    Coronary artery disease Brother    Cancer Neg Hx        negative for colon cancer    ROS: no fevers or chills, productive cough, hemoptysis, dysphasia, odynophagia, melena, hematochezia, dysuria, hematuria, rash, seizure activity, orthopnea, PND, pedal edema, claudication. Remaining systems are negative.  Physical Exam: Well-developed well-nourished in no acute distress.  Skin is warm and dry.  HEENT is normal.  Neck is supple.  Chest is clear to auscultation with normal expansion.  Cardiovascular exam is regular rate and rhythm.  Abdominal exam nontender or distended. No masses palpated. Extremities show no edema. neuro grossly intact  ECG-normal sinus rhythm at a rate of 61, normal axis, anterior T wave inversion.  Repeat immediately showed sinus rhythm  with left bundle branch block.  Personally reviewed  A/P  1 status post aortic valve replacement-continue SBE prophylaxis.  Continue Coumadin with goal  INR 2.5-3.5.  Given recent CVA he was also placed on Plavix in addition to his Coumadin.  Patient had a CVA in May and then again in December.  I will arrange a transesophageal echocardiogram to assess his aortic valve and rule out source of embolus.  2 thoracic aortic aneurysm-plan follow-up CTA October 2024.  3 lung nodule-plan follow-up CT as outlined October 2024.  4 coronary artery disease-no chest pain.  Continue statin.  We previously reviewed patient's cardiac catheterization films from [place] (I reviewed with Dr. Claiborne Billings).  Best option is felt to be medical therapy.  5 hypertension-patient's blood pressure is controlled.  Continue present medications and follow.  6 hyperlipidemia-continue statin.  7 history of hypertrophic cardiomyopathy-status post myectomy.  No gradients noted on follow-up echocardiogram.  Continue beta-blocker.  8 paroxysmal atrial fibrillation-patient remains in sinus rhythm.  Continue beta-blocker and Coumadin.  As outlined previously this was felt to be the cause of his non-ST elevation myocardial infarction in [place] due to demand ischemia with elevated heart rate.  If he has more frequent episodes in the future we will consider antiarrhythmic.  Kirk Ruths, MD

## 2022-02-06 NOTE — Assessment & Plan Note (Signed)
Lab Results  Component Value Date   CHOL 119 01/25/2022   HDL 25 (L) 01/25/2022   LDLCALC UNABLE TO CALCULATE IF TRIGLYCERIDE OVER 400 mg/dL 01/25/2022   LDLDIRECT 53 01/26/2022   TRIG 604 (H) 01/25/2022   CHOLHDL 4.8 01/25/2022  Maintained on tricor and crestor '40mg'$ .

## 2022-02-06 NOTE — Assessment & Plan Note (Signed)
BP Readings from Last 3 Encounters:  02/06/22 (!) 100/59  01/27/22 130/73  01/06/22 124/62   BP stable on metoprolol and amlodipine, continue same.

## 2022-02-07 NOTE — Addendum Note (Signed)
Addended by: [name] on: 02/07/2022 12:05 PM   Modules accepted: Level of Service

## 2022-02-07 NOTE — Assessment & Plan Note (Signed)
He is on max dose of fenofibrate and crestor.  Continue to work on diet and plan repeat FLP next visit.

## 2022-02-07 NOTE — Assessment & Plan Note (Signed)
Goal INR 2.5 to 3.5. Management per coumadin clinic. He was sent home on a lovenox bridge but this has been discontinued by the coumadin clinic.

## 2022-02-07 NOTE — Assessment & Plan Note (Signed)
Lab Results  Component Value Date   HGBA1C 8.2 (H) 01/24/2022   HGBA1C 7.7 (A) 10/28/2021   HGBA1C 8.3 (H) 06/25/2021   Lab Results  Component Value Date   MICROALBUR <0.7 10/04/2021   LDLCALC UNABLE TO CALCULATE IF TRIGLYCERIDE OVER 400 mg/dL 01/25/2022   CREATININE 1.32 (H) 01/27/2022   This is being managed by endocrinology.

## 2022-02-08 ENCOUNTER — Ambulatory Visit: Payer: Medicare HMO | Attending: Cardiology | Admitting: *Deleted

## 2022-02-08 ENCOUNTER — Ambulatory Visit: Payer: Medicare HMO

## 2022-02-08 DIAGNOSIS — Z952 Presence of prosthetic heart valve: Secondary | ICD-10-CM | POA: Diagnosis not present

## 2022-02-08 DIAGNOSIS — Z7901 Long term (current) use of anticoagulants: Secondary | ICD-10-CM | POA: Diagnosis not present

## 2022-02-08 LAB — POCT INR: INR: 3.2 — AB (ref 2.0–3.0)

## 2022-02-08 NOTE — Patient Instructions (Addendum)
Description   Continue taking warfarin 1 tablet daily except for 1/2 tablet on Mondays, Wednesdays, and Fridays. Continue consistency with leafy green intake. Recheck INR in 2 weeks. Coumadin Clinic [phone] or [phone].

## 2022-02-10 ENCOUNTER — Ambulatory Visit: Payer: Self-pay

## 2022-02-10 ENCOUNTER — Ambulatory Visit: Payer: Medicare HMO | Admitting: Internal Medicine

## 2022-02-10 ENCOUNTER — Encounter: Payer: Self-pay | Admitting: Internal Medicine

## 2022-02-10 VITALS — BP 104/68 | HR 68 | Ht 69.0 in | Wt 213.0 lb

## 2022-02-10 DIAGNOSIS — E1122 Type 2 diabetes mellitus with diabetic chronic kidney disease: Secondary | ICD-10-CM

## 2022-02-10 DIAGNOSIS — Z794 Long term (current) use of insulin: Secondary | ICD-10-CM

## 2022-02-10 DIAGNOSIS — E1142 Type 2 diabetes mellitus with diabetic polyneuropathy: Secondary | ICD-10-CM

## 2022-02-10 DIAGNOSIS — E1159 Type 2 diabetes mellitus with other circulatory complications: Secondary | ICD-10-CM | POA: Diagnosis not present

## 2022-02-10 DIAGNOSIS — N1831 Chronic kidney disease, stage 3a: Secondary | ICD-10-CM | POA: Diagnosis not present

## 2022-02-10 DIAGNOSIS — E118 Type 2 diabetes mellitus with unspecified complications: Secondary | ICD-10-CM

## 2022-02-10 DIAGNOSIS — E1165 Type 2 diabetes mellitus with hyperglycemia: Secondary | ICD-10-CM | POA: Diagnosis not present

## 2022-02-10 NOTE — Patient Instructions (Signed)
Visit Information  Thank you for taking time to visit with me today. Please don't hesitate to contact me if I can be of assistance to you.   Following are the goals we discussed today:   Goals Addressed             This Visit's Progress    Health Management       Care Coordination Interventions: health management post hospitalization: 01/24/22-01/27/22 CVA Evaluation of current treatment plan related to stroke and patient's adherence to plan as established by provider Discussed care coordination program Reviewed medications and confirmed patient is taking as prescribed Reviewed upcoming appointments        Our next appointment is by telephone on 02/24/22 at 10:00 am  Please call the care guide team at [phone] if you need to cancel or reschedule your appointment.   If you are experiencing a Mental Health or [place] or need someone to talk to, please call the Suicide and Crisis Lifeline: [place], RN, MSN, BSN, [place] [phone]

## 2022-02-10 NOTE — Patient Instructions (Signed)
-   Continue Farxiga 10 mg , 1 tablet every morning  - Continue Basaglar 22 units daily  - Change  Novolog to 16 units with Breakfast, 14 units with Lunch and 16 units with Supper   Novolog correctional insulin: ADD extra units on insulin to your meal-time Novolog dose if your blood sugars are higher than 185. Use the scale below to help guide you:   Blood sugar before meal Number of units to inject  Less than 185 0 unit  186 -  210 1 units  211 -  235 2 units  236 -  260 3 units  261 -  285 4 units  286 -  310 5 units  311 -  335 6 units  336 -  360 7 units  361 -  385 8 units  386 - 410  9 units       HOW TO TREAT LOW BLOOD SUGARS (Blood sugar LESS THAN 70 MG/DL) Please follow the RULE OF 15 for the treatment of hypoglycemia treatment (when your (blood sugars are less than 70 mg/dL)   STEP 1: Take 15 grams of carbohydrates when your blood sugar is low, which includes:  3-4 GLUCOSE TABS  OR 3-4 OZ OF JUICE OR REGULAR SODA OR ONE TUBE OF GLUCOSE GEL    STEP 2: RECHECK blood sugar in 15 MINUTES STEP 3: If your blood sugar is still low at the 15 minute recheck --> then, go back to STEP 1 and treat AGAIN with another 15 grams of carbohydrates.

## 2022-02-10 NOTE — Patient Outreach (Signed)
  Care Coordination   Initial Visit Note   02/10/2022 Name: [name] MRN: [id number] DOB: August 31, 1945  [name] is a 77 y.o. year old male who sees David Alar, NP for primary care. I spoke with  [name] by phone today.  What matters to the patients health and wellness today?  Admission 12/26-12/29/23 for CVA. [name] reports he is doing well. He is alert, oriented, reports he does not have to use assistive device to ambulate. Reports taking medications as prescribed. Patient went into "my chart" to pull up current medications list-medications reviewed with patient. Post hospital follow up completed with PCP on 02/06/22. Patent denies any questions or concerns at this time.    Goals Addressed             This Visit's Progress    Health Management       Care Coordination Interventions: health management post hospitalization: 01/24/22-01/27/22 CVA Evaluation of current treatment plan related to stroke and patient's adherence to plan as established by provider Discussed care coordination program Reviewed medications and confirmed patient is taking as prescribed Reviewed upcoming appointments        SDOH assessments and interventions completed:  Yes  SDOH Interventions Today    Flowsheet Row Most Recent Value  SDOH Interventions   Food Insecurity Interventions Intervention Not Indicated  Housing Interventions Intervention Not Indicated  Transportation Interventions Intervention Not Indicated  Utilities Interventions Intervention Not Indicated     Care Coordination Interventions:  Yes, provided   Follow up plan: Follow up call scheduled for 02/24/22    Encounter Outcome:  Pt. Visit Completed   David Silversmith, RN, MSN, BSN, [place] Coordinator [phone]

## 2022-02-10 NOTE — Progress Notes (Unsigned)
Name: [name]  Age/ Sex: 77 y.o., male   MRN/ DOB: [id number], [birth year]     PCP: [name], NP   Reason for Endocrinology Evaluation: Type 2 Diabetes Mellitus  Initial Endocrine Consultative Visit: 11/11/2018    PATIENT IDENTIFIER: [name] is a 77 y.o. male with a past medical history of T2DM, OSA, HTN, CAD ( S/P CABG) . The patient has followed with Endocrinology clinic since 11/11/2018 for consultative assistance with management of his diabetes.  DIABETIC HISTORY:  [name] was diagnosed with T2DM in 2005. He was on metformin which was stopped due to renal dysfunction . Has been on insulin since 2015. His hemoglobin A1c has ranged from 6.3% in 2015, peaking at 8.3% in 2020.  On his initial visit to our clinic he was on basal insulin only. With an A1c of 8.3. Trulicity was added.    Novolog started 04/2020 due to an A1c 8.4%  Stopped trulicity due to cost  Started Farxiga 07/2020 through pt assistance program   SUBJECTIVE:   During the last visit (10/28/2021): A1c of 7.7 %.    Today (02/10/2022): [name] is here for a  follow up on diabetes management. He is accompanied by his wife today . He checks his blood sugars multiple  times daily, through dexcom.  The patient has not had hypoglycemic episodes since the last clinic visit.   Patient presented to the ED in 01/24/2022 with another CVA was evaluated by cardiology in 03/2021 S/P aortic valve replacement, on Coumadin and CAD as well as dilated ascending aorta He continues to follow-up with pulmonary for OSA Had an ED visit 10/08/2021 for MI     HOME DIABETES REGIMEN:  Basaglar 22  units daily  Novolog 14 units TIDQAC Farxiga 10 mg daily  Cf: Novlog (bG-130/25)      CONTINUOUS GLUCOSE MONITORING RECORD INTERPRETATION    Dates of Recording: 12/30-1/12/2022  Sensor description: dexcom   Results statistics:   CGM use % of time 86  Average and SD 167/49  Time in range 64 %   % Time Above 180 27  % Time above 250 6  % Time Below target 2      Glycemic patterns summary: Bg's optimal overnight, high during the day   Hyperglycemic episodes  postprandial   Hypoglycemic episodes occurred  N/A  Overnight periods: optimal     DIABETIC COMPLICATIONS: Microvascular complications:  CKDIII- Dr. Hollie Salk  Denies: neuropathy, retinopathy  Last eye exam: Completed 03/25/2020    Macrovascular complications:  CAD (S/P CABG )  Denies: PVD, CVA    HISTORY:  Past Medical History:  Past Medical History:  Diagnosis Date   Actinic keratosis 02/14/2011   Allergy    Aneurysm of ascending aorta without rupture ([place]) 01/06/2022   Aortic stenosis    Arthritis    Bilateral sensorineural hearing loss 06/16/2009   CAD (coronary artery disease)    cabg   Complication of anesthesia    Coronary atherosclerosis of native coronary artery 06/15/2009   Diabetic polyneuropathy 11/11/2018   Dyslipidemia 13/24/4010   Embolic stroke involving left middle cerebral artery  05/30/2021   Erectile dysfunction 05/18/2009   Essential hypertension 06/16/2009   Expressive aphasia 06/25/2021   External hemorrhoids 03/21/2010   Gout 04/09/2015   Heart murmur    Hemochromatosis 02/14/2011   History of aortic valve replacement 10/13/2009   History of CABG 11/05/2018   2005- 1. Coronary artery bypass grafting x4 with LIMA-LAD,  SVG-OM1, SVG-AM and dCFX.  2. Aortic valve replacement with [employer] Jude AVR 3. Septal myomectomy.  4. Myoview low risk 2016   History of hepatitis 01/31/1972   Hypertriglyceridemia 06/13/2010   Hypertrophic obstructive cardiomyopathy 05/06/2008   SP septal myomectomy at surgery 2005 Echo Nov 2018- Hyperdynamic LVEF with severe basal septal hypertrophy. There is chordal SAM with resting gradient of 16 mmHg that increases to 85 mmHg with Valsalva        Leg weakness, bilateral 01/18/2021   Long term (current) use of anticoagulants 05/22/2018   Lumbar stenosis with  neurogenic claudication 02/04/2021   Malignant neoplasm of prostate 06/17/2009   Mechanical heart valve present    Manufacturer: Sherlean Foot #: [id number]  Model #: [id number]. Card states MRI compatible with 3 teslas or less.   Obesity, unspecified 04/29/2008   OSA (obstructive sleep apnea) 07/12/2012   moderate-severe; uses CPAP nightly   PONV (postoperative nausea and vomiting)    after CABG- slow to wake up   Presence of prosthetic heart valve    Primary osteoarthritis of left shoulder 10/31/2018   S/P lumbar laminectomy 02/04/2021   Stage 3 chronic kidney disease due to type 2 diabetes mellitus 11/11/2018   Tobacco use 06/16/2009   Type 2 diabetes mellitus with hyperglycemia    Ventral hernia 03/03/2014   Past Surgical History:  Past Surgical History:  Procedure Laterality Date   AORTIC VALVE REPLACEMENT  11/14/2003   [facility]   APPENDECTOMY  [place]   CORONARY ANGIOGRAPHY  10/11/2021   CORONARY ARTERY BYPASS GRAFT  10/2003   [place]   right, inguinal   HERNIA REPAIR  2002   left, inguinal   HIP SURGERY  2006   right hip   IR CT HEAD LTD  02/03/2022   IR PERCUTANEOUS ART THROMBECTOMY/INFUSION INTRACRANIAL INC DIAG ANGIO  01/24/2022   LUMBAR LAMINECTOMY/DECOMPRESSION MICRODISCECTOMY Bilateral 02/04/2021   Procedure: Bilateral Lumbar Two-Three Laminectomy;  Surgeon: Kristeen Miss, MD;  Location: [facility];  Service: Neurosurgery;  Laterality: Bilateral;  3C/RM 20   PILONIDAL CYST EXCISION  1964   prostate seed implant  03/2010   RADIOLOGY WITH ANESTHESIA N/A 01/24/2022   Procedure: IR WITH ANESTHESIA;  Surgeon: Radiologist, Medication, MD;  Location: [facility];  Service: Radiology;  Laterality: N/A;   TONSILLECTOMY  childhood   Social History:  reports that he has been smoking cigars. He has never used smokeless tobacco. He reports that he does not currently use alcohol. He reports that he does not use drugs. Family History:  Family  History  Problem Relation Age of Onset   Heart disease Mother    Stroke Mother    Heart disease Father    Asperger's syndrome Son    Hyperlipidemia Son    Coronary artery disease Brother    Cancer Neg Hx        negative for colon cancer     HOME MEDICATIONS: Allergies as of 02/10/2022   No Known Allergies      Medication List        Accurate as of February 10, 2022  2:52 PM. If you have any questions, ask your nurse or doctor.          AMBULATORY NON FORMULARY MEDICATION cpap cushions            AirFit F20 (Size: Large)           AirFit F30 (Size: Med)   Please FAX  the prescription to:  [phone]   amLODipine 5 MG tablet Commonly known as: NORVASC TAKE 1 TABLET (5 MG TOTAL) BY MOUTH DAILY.   Basaglar KwikPen 100 UNIT/ML Inject 22 Units into the skin daily.   betamethasone dipropionate 0.05 % cream Apply topically 2 (two) times daily as needed. To eczema rash   Blood Glucose Monitoring Suppl Supplies Misc Use for monitoring glucose level   clopidogrel 75 MG tablet Commonly known as: PLAVIX TAKE 1 TABLET BY MOUTH EVERY DAY   colchicine 0.6 MG tablet TAKE 1 TABLET (0.6 MG TOTAL) BY MOUTH 2 (TWO) TIMES DAILY AS NEEDED.   dapagliflozin propanediol 10 MG Tabs tablet Commonly known as: Farxiga Take 1 tablet (10 mg total) by mouth daily before breakfast.   Dexcom G6 Sensor Misc 1 Device by Does not apply route as directed.   fenofibrate 145 MG tablet Commonly known as: TRICOR TAKE 1 TABLET BY MOUTH EVERY DAY   furosemide 20 MG tablet Commonly known as: LASIX TAKE 1 TABLET BY MOUTH EVERY DAY AS NEEDED FOR SWELLING   Insulin Pen Needle 32G X 4 MM Misc 1 Device by Does not apply route in the morning, at noon, in the evening, and at bedtime.   isosorbide mononitrate 30 MG 24 hr tablet Commonly known as: IMDUR Take 30 mg by mouth daily.   metoprolol succinate 25 MG 24 hr tablet Commonly known as: TOPROL-XL TAKE 1 TABLET BY MOUTH EVERY DAY    NovoLOG FlexPen 100 UNIT/ML FlexPen Generic drug: insulin aspart Max daily 70 units   NovoLOG FlexPen 100 UNIT/ML FlexPen Generic drug: insulin aspart Inject 14 Units into the skin 3 (three) times daily with meals.   OneTouch Delica Lancets 54U Misc USE AS DIRECTED   OneTouch Verio test strip Generic drug: glucose blood Use as instructed   rosuvastatin 40 MG tablet Commonly known as: CRESTOR TAKE 1 TABLET BY MOUTH EVERY DAY   tacrolimus 0.1 % ointment Commonly known as: PROTOPIC Apply 1 application  topically daily as needed (irritation).   tamsulosin 0.4 MG Caps capsule Commonly known as: FLOMAX Take 1 capsule (0.4 mg total) by mouth daily.   warfarin 7.5 MG tablet Commonly known as: COUMADIN Take as directed by the anticoagulation clinic. If you are unsure how to take this medication, talk to your nurse or doctor. Original instructions: TAKE 1/2 A TABLET TO 1 TABLET BY MOUTH DAILY AS DIRECTED BY THE COUMADIN CLINIC         OBJECTIVE:   Vital Signs: BP 104/68 (BP Location: Left Arm, Patient Position: Sitting, Cuff Size: Small)   Pulse 68   Ht '5\' 9"'$  (1.753 m)   Wt 213 lb (96.6 kg)   SpO2 99%   BMI 31.45 kg/m   Wt Readings from Last 3 Encounters:  02/10/22 213 lb (96.6 kg)  02/06/22 215 lb (97.5 kg)  01/24/22 224 lb 6.9 oz (101.8 kg)     Exam: General: Pt appears well and is in NAD  Lungs: Clear with good BS bilat with no rales, rhonchi, or wheezes  Heart: RRR with normal S1 and S2 and no gallops; no murmurs; no rub  Extremities: Trace  pretibial edema.   Neuro: MS is good with appropriate affect, pt is alert and Ox3      DM Foot Exam 04/26/2021  The skin of the feet is intact without sores or ulcerations. The pedal pulses are 1+ on right and 1+ on left. The sensation is intact to a screening 5.07, 10 gram monofilament  bilaterally    DATA REVIEWED:  Lab Results  Component Value Date   HGBA1C 8.2 (H) 01/24/2022   HGBA1C 7.7 (A) 10/28/2021    HGBA1C 8.3 (H) 06/25/2021     Latest Reference Range & Units 06/26/21 02:12  Sodium 135 - 145 mmol/L 138  Potassium 3.5 - 5.1 mmol/L 3.9  Chloride 98 - 111 mmol/L 106  CO2 22 - 32 mmol/L 23  Glucose 70 - 99 mg/dL 170 (H)  BUN 8 - 23 mg/dL 25 (H)  Creatinine 0.61 - 1.24 mg/dL 1.51 (H)  Calcium 8.9 - 10.3 mg/dL 9.2  Anion gap 5 - 15  9  GFR, Estimated >60 mL/min 48 (L)      ASSESSMENT / PLAN / RECOMMENDATIONS:   1) Type 2 Diabetes Mellitus, Poorly controlled, With CKD III , Neuropathic and Macrovascular complications - Most recent A1c of 8.2 %. Goal A1c < 7.5%.   -His  A1c has increased  - Pt has been holding  his novolog until after he eats if his pre-meal Bg is in the 100's. I have discouraged the pt from this practice and to take his insulin premeal.  - Per spouse he has been drinking frappe's, discussed avoiding sugar-sweetened beverages  - Will increase prandial insulin with breakfast and Supper as below  - GLP-1 agonists have been cost prohibitive and did not improve his glycemic control    MEDICATIONS: - Continue  Farxiga 10 mg , 1 tablet every morning  - Continue   Basaglar 22 units daily  - Change  Novolog 16 units with Breakfast, 14 units with lunch and 16 units with Supper - Continue Correction Scale : Humalog (BG -130/25)     EDUCATION / INSTRUCTIONS: BG monitoring instructions: Patient is instructed to check his blood sugars 3 times a day, before each meal. Call [facility] if: BG persistently < 70 I reviewed the Rule of 15 for the treatment of hypoglycemia in detail with the patient. Literature supplied.   2) Diabetic complications:  Eye: Does not have known diabetic retinopathy. Neuro/ Feet: Does have known diabetic peripheral neuropathy. Renal: Patient does have known baseline CKD. He is on an ACEI/ARB at present. He sees Dr. Hollie Salk    F/U in 4 months    Signed electronically by: Mack Guise, MD  [facility]   [facility] [address] [place], [place] [address] Phone: [phone] FAX: [phone]   CC: [name], NP [place] [address] [place] [address] Phone: [phone]  Fax: [phone]  Return to Endocrinology clinic as below: Future Appointments  Date Time Provider [place]  02/16/2022  2:30 PM Rondel Jumbo, PA-C [facility] None  02/17/2022  1:30 PM Lelon Perla, MD [facility] None  02/22/2022 11:15 AM [facility] None  02/24/2022 10:00 AM Luretha Rued, RN [facility] None  03/07/2022 11:00 AM Rondel Jumbo, PA-C [facility] None  05/08/2022 11:20 AM [name], NP [facility]  07/14/2022 10:40 AM [name], NP [facility]

## 2022-02-13 ENCOUNTER — Encounter: Payer: Self-pay | Admitting: Internal Medicine

## 2022-02-13 MED ORDER — INSULIN PEN NEEDLE 32G X 4 MM MISC: 1.0000 | Freq: Four times a day (QID) | 3 refills | Status: DC

## 2022-02-13 MED ORDER — NOVOLOG FLEXPEN 100 UNIT/ML ~~LOC~~ SOPN: PEN_INJECTOR | SUBCUTANEOUS | 3 refills | Status: DC

## 2022-02-13 MED ORDER — DAPAGLIFLOZIN PROPANEDIOL 10 MG PO TABS: 10.0000 mg | ORAL_TABLET | Freq: Every day | ORAL | 3 refills | Status: DC

## 2022-02-13 MED ORDER — BASAGLAR KWIKPEN 100 UNIT/ML ~~LOC~~ SOPN: 22.0000 [IU] | PEN_INJECTOR | Freq: Every day | SUBCUTANEOUS | 3 refills | Status: DC

## 2022-02-15 DIAGNOSIS — R339 Retention of urine, unspecified: Secondary | ICD-10-CM | POA: Diagnosis not present

## 2022-02-15 DIAGNOSIS — N3000 Acute cystitis without hematuria: Secondary | ICD-10-CM | POA: Diagnosis not present

## 2022-02-15 DIAGNOSIS — R8271 Bacteriuria: Secondary | ICD-10-CM | POA: Diagnosis not present

## 2022-02-16 ENCOUNTER — Ambulatory Visit: Payer: Medicare HMO | Admitting: Physician Assistant

## 2022-02-16 NOTE — Progress Notes (Incomplete)
[facility] Clinic Note - Initial Visit   Date: 02/16/22  [name] MRN: [id number] DOB: August 16, 1945      IMPRESSION/PLAN:      Continue donepezil 10 mg daily Side effects were discussed Continue speech therapy, home health Continue baby aspirin and Coumadin as recommended by cardiology, goal INR 2.5-3.5 . Continue to use a CPAP   Keep the appt with Neurocognitive testing  Follow up in 6  months.   Total time spent:    History of Present Illness: [name] is a 77 y.o. ***-handed male with hypertension, hyperlipidemia, DM2, history of CVA, OSA, St 3 CKD, CAD s/p CABG, s/p AVR on Coumadin, h/o prostate cancer, h/o bradycardia, hemochromatosis, B hearing loss, memory difficulties with normal neuropsychological exam,  *** presenting for evaluation of  recent recurrent stroke in follow up. ***.   He had a prior stroke on May 2023, small to moderate left MCA branch infarct affecting left insular and frontal cortex, with small chronic cerebellar infarcts and mild chronic microvascular ischemia and cerebral volume loss for age. HE was on ASA and Coumadin and despite being compliant to medication,  on 01/24/22 he sustained a L MCA occlusion, likely thrombotic due to mechanical heart (with non therapeutic INR)  requiring TICI3 with revascularization . He was discharged on 01/26/22 in stable condition.       Denies vertigo dizziness or vision changes. Denies headaches, dysarthria or dysphagia. No confusion or seizures. Denies any chest pain, or shortness of breath. Denies any fever or chills, or night sweats. No tobacco. No new meds or hormonal supplements.  Denies any recent long distance trips or recent surgeries. No sick contacts. No new stressors present in personal life. Patient is compliant with his medications. .Patient is very active, exercising daily.      CTA head & neck: Acute and near-total occlusion of the left M1 segment 8 mm from the  origin. Small amount of flow in the more distal MCA branches,presumably due to leptomeningeal collaterals. Atherosclerotic disease at both carotid bifurcations. 25% stenosis of the proximal ICA on the right. No measurable stenosis on the left. Atherosclerotic disease in both carotid siphon regions with serial stenoses estimated at 70% on the right and 50% on the left. 30% stenosis of the left vertebral artery origin.Both posterior cerebral arteries take fetal origin from the anterior circulation.6. Aortic atherosclerosis.   MRI: Mild restricted diffusion in the white matter tracts of the left basal ganglia and caudate, compatible with acute infarct Additional punctate acute infarct in the right perirolandic posterior frontal lobe. Small area of acute infarct versus artifact within the more anterior right frontal lobe white matter.   MRA: Left M1 MCA is now patent after thrombectomy. No emergent large vessel  occlusion. 2D echo EF 50-55% LDL 53 HgbA1c 8.2   Out-side paper records, electronic medical record, and images have been reviewed where available and summarized as: *** Lab Results  Component Value Date   HGBA1C 8.2 (H) 01/24/2022   Lab Results  Component Value Date   VITAMINB12 221 12/28/2020   Lab Results  Component Value Date   TSH 4.39 12/28/2020   Lab Results  Component Value Date   ESRSEDRATE 18 01/20/2021    Past Medical History:  Diagnosis Date   Actinic keratosis 02/14/2011   Allergy    Aneurysm of ascending aorta without rupture ([place]) 01/06/2022   Aortic stenosis    Arthritis    Bilateral sensorineural hearing loss 06/16/2009   CAD (coronary  artery disease)    cabg   Complication of anesthesia    Coronary atherosclerosis of native coronary artery 06/15/2009   Diabetic polyneuropathy 11/11/2018   Dyslipidemia 85/63/1497   Embolic stroke involving left middle cerebral artery  05/30/2021   Erectile dysfunction 05/18/2009   Essential hypertension 06/16/2009    Expressive aphasia 06/25/2021   External hemorrhoids 03/21/2010   Gout 04/09/2015   Heart murmur    Hemochromatosis 02/14/2011   History of aortic valve replacement 10/13/2009   History of CABG 11/05/2018   2005- 1. Coronary artery bypass grafting x4 with LIMA-LAD, SVG-OM1, SVG-AM and dCFX.  2. Aortic valve replacement with [facility] Jude AVR 3. Septal myomectomy.  4. Myoview low risk 2016   History of hepatitis 01/31/1972   Hypertriglyceridemia 06/13/2010   Hypertrophic obstructive cardiomyopathy 05/06/2008   SP septal myomectomy at surgery 2005 Echo Nov 2018- Hyperdynamic LVEF with severe basal septal hypertrophy. There is chordal SAM with resting gradient of 16 mmHg that increases to 85 mmHg with Valsalva        Leg weakness, bilateral 01/18/2021   Long term (current) use of anticoagulants 05/22/2018   Lumbar stenosis with neurogenic claudication 02/04/2021   Malignant neoplasm of prostate 06/17/2009   Mechanical heart valve present    Manufacturer: Sherlean Foot #: [id number]  Model #: [id number]. Card states MRI compatible with 3 teslas or less.   Obesity, unspecified 04/29/2008   OSA (obstructive sleep apnea) 07/12/2012   moderate-severe; uses CPAP nightly   PONV (postoperative nausea and vomiting)    after CABG- slow to wake up   Presence of prosthetic heart valve    Primary osteoarthritis of left shoulder 10/31/2018   S/P lumbar laminectomy 02/04/2021   Stage 3 chronic kidney disease due to type 2 diabetes mellitus 11/11/2018   Tobacco use 06/16/2009   Type 2 diabetes mellitus with hyperglycemia    Ventral hernia 03/03/2014    Past Surgical History:  Procedure Laterality Date   AORTIC VALVE REPLACEMENT  11/14/2003   [facility]   APPENDECTOMY  [place]   CORONARY ANGIOGRAPHY  10/11/2021   CORONARY ARTERY BYPASS GRAFT  10/2003   [place]   right, inguinal   HERNIA REPAIR  2002   left, inguinal   HIP SURGERY  2006   right hip   IR CT  HEAD LTD  02/03/2022   IR PERCUTANEOUS ART THROMBECTOMY/INFUSION INTRACRANIAL INC DIAG ANGIO  01/24/2022   LUMBAR LAMINECTOMY/DECOMPRESSION MICRODISCECTOMY Bilateral 02/04/2021   Procedure: Bilateral Lumbar Two-Three Laminectomy;  Surgeon: Kristeen Miss, MD;  Location: [place];  Service: Neurosurgery;  Laterality: Bilateral;  3C/RM 20   PILONIDAL CYST EXCISION  1964   prostate seed implant  03/2010   RADIOLOGY WITH ANESTHESIA N/A 01/24/2022   Procedure: IR WITH ANESTHESIA;  Surgeon: Radiologist, Medication, MD;  Location: [facility];  Service: Radiology;  Laterality: N/A;   TONSILLECTOMY  childhood     Medications:  Outpatient Encounter Medications as of 02/16/2022  Medication Sig Note   AMBULATORY NON FORMULARY MEDICATION cpap cushions            AirFit F20 (Size: Large)           AirFit F30 (Size: Med)   Please FAX the prescription to:  [phone]    amLODipine (NORVASC) 5 MG tablet TAKE 1 TABLET (5 MG TOTAL) BY MOUTH DAILY.    betamethasone dipropionate (DIPROLENE) 0.05 % cream Apply topically 2 (two) times daily as needed. To  eczema rash    Blood Glucose Monitoring Suppl Supplies MISC Use for monitoring glucose level    clopidogrel (PLAVIX) 75 MG tablet TAKE 1 TABLET BY MOUTH EVERY DAY    colchicine 0.6 MG tablet TAKE 1 TABLET (0.6 MG TOTAL) BY MOUTH 2 (TWO) TIMES DAILY AS NEEDED.    Continuous Blood Gluc Sensor (DEXCOM G6 SENSOR) MISC 1 Device by Does not apply route as directed. 01/06/2022: Has G7 meter   dapagliflozin propanediol (FARXIGA) 10 MG TABS tablet Take 1 tablet (10 mg total) by mouth daily before breakfast.    fenofibrate (TRICOR) 145 MG tablet TAKE 1 TABLET BY MOUTH EVERY DAY    furosemide (LASIX) 20 MG tablet TAKE 1 TABLET BY MOUTH EVERY DAY AS NEEDED FOR SWELLING 02/10/2022: Reports taking every day   glucose blood (ONETOUCH VERIO) test strip Use as instructed 07/12/2021: Only checks if Dexcom readings are off   insulin aspart (NOVOLOG FLEXPEN) 100 UNIT/ML FlexPen Max daily  75 units    Insulin Glargine (BASAGLAR KWIKPEN) 100 UNIT/ML Inject 22 Units into the skin daily.    Insulin Pen Needle 32G X 4 MM MISC 1 Device by Does not apply route in the morning, at noon, in the evening, and at bedtime.    isosorbide mononitrate (IMDUR) 30 MG 24 hr tablet Take 30 mg by mouth daily.    metoprolol succinate (TOPROL-XL) 25 MG 24 hr tablet TAKE 1 TABLET BY MOUTH EVERY DAY    OneTouch Delica Lancets 30S MISC USE AS DIRECTED    rosuvastatin (CRESTOR) 40 MG tablet TAKE 1 TABLET BY MOUTH EVERY DAY    tacrolimus (PROTOPIC) 0.1 % ointment Apply 1 application  topically daily as needed (irritation).    tamsulosin (FLOMAX) 0.4 MG CAPS capsule Take 1 capsule (0.4 mg total) by mouth daily.    warfarin (COUMADIN) 7.5 MG tablet TAKE 1/2 A TABLET TO 1 TABLET BY MOUTH DAILY AS DIRECTED BY THE COUMADIN CLINIC    No facility-administered encounter medications on file as of 02/16/2022.    Allergies: No Known Allergies  Family History: Family History  Problem Relation Age of Onset   Heart disease Mother    Stroke Mother    Heart disease Father    Asperger's syndrome Son    Hyperlipidemia Son    Coronary artery disease Brother    Cancer Neg Hx        negative for colon cancer    Social History: Social History   Tobacco Use   Smoking status: Some Days    Types: Cigars    Last attempt to quit: 06/2021    Years since quitting: 0.6   Smokeless tobacco: Never   Tobacco comments:    Pt states he smokes 1 cigar about once a month. 06/01/2021    Pt quit  Cigars 06/2021  Substance Use Topics   Alcohol use: Not Currently    Comment: wine maybe 1 per month   Drug use: No   Social History   Social History Narrative   ** Merged History Encounter **       Right Handed  Lives in a one sotyr home. One step leads to front door.     Vital Signs:  There were no vitals taken for this visit.   General Medical Exam:   General:  Well appearing, comfortable.   Eyes/ENT: see cranial  nerve examination.   Neck:   No carotid bruits. Respiratory:  Clear to auscultation, good air entry bilaterally.   Cardiac:  Regular  rate and rhythm, no murmur.   Extremities:  No deformities, edema, or skin discoloration.  Skin:  No rashes or lesions.  Neurological Exam: MENTAL STATUS including orientation to time, place, person, recent and remote memory, attention span and concentration, language, and fund of knowledge is ***normal.  Speech is not dysarthric.  CRANIAL NERVES: II:  No visual field defects.  Unremarkable fundi.   III-IV-VI: Pupils equal round and reactive to light.  Normal conjugate, extra-ocular eye movements in all directions of gaze.  No nystagmus.  No ptosis***.   V:  Normal facial sensation.    VII:  Normal facial symmetry and movements.   VIII:  Normal hearing and vestibular function.   IX-X:  Normal palatal movement.   XI:  Normal shoulder shrug and head rotation.   XII:  Normal tongue strength and range of motion, no deviation or fasciculation.  MOTOR:  No atrophy, fasciculations or abnormal movements.  No pronator drift.    SENSORY:  Normal and symmetric perception of light touch, pinprick, vibration, and proprioception.  Romberg's sign absent.   COORDINATION/GAIT: Normal finger-to- nose-finger and heel-to-shin.  Intact rapid alternating movements bilaterally.  Able to rise from a chair without using arms.  Gait narrow based and stable. Tandem and stressed gait intact.     Thank you for allowing me to participate in patient's care.  If I can answer any additional questions, I would be pleased to do so.    Sincerely,   Sharene Butters, PA-C

## 2022-02-17 ENCOUNTER — Ambulatory Visit: Payer: Medicare HMO | Attending: Cardiology | Admitting: Cardiology

## 2022-02-17 ENCOUNTER — Encounter: Payer: Self-pay | Admitting: Cardiology

## 2022-02-17 VITALS — BP 114/60 | HR 63 | Ht 69.0 in | Wt 213.4 lb

## 2022-02-17 DIAGNOSIS — I25810 Atherosclerosis of coronary artery bypass graft(s) without angina pectoris: Secondary | ICD-10-CM

## 2022-02-17 DIAGNOSIS — I10 Essential (primary) hypertension: Secondary | ICD-10-CM

## 2022-02-17 DIAGNOSIS — I7121 Aneurysm of the ascending aorta, without rupture: Secondary | ICD-10-CM | POA: Diagnosis not present

## 2022-02-17 DIAGNOSIS — Z8673 Personal history of transient ischemic attack (TIA), and cerebral infarction without residual deficits: Secondary | ICD-10-CM | POA: Diagnosis not present

## 2022-02-17 DIAGNOSIS — I480 Paroxysmal atrial fibrillation: Secondary | ICD-10-CM | POA: Diagnosis not present

## 2022-02-17 DIAGNOSIS — E785 Hyperlipidemia, unspecified: Secondary | ICD-10-CM

## 2022-02-17 DIAGNOSIS — Z952 Presence of prosthetic heart valve: Secondary | ICD-10-CM | POA: Diagnosis not present

## 2022-02-17 DIAGNOSIS — E119 Type 2 diabetes mellitus without complications: Secondary | ICD-10-CM | POA: Diagnosis not present

## 2022-02-17 NOTE — Addendum Note (Signed)
Addended by: Cristopher Estimable on: 02/17/2022 02:14 PM   Modules accepted: Orders

## 2022-02-17 NOTE — Patient Instructions (Signed)
  You are scheduled for a TEE (Transesophageal Echocardiogram) on Monday, February 13 with Dr. Margaretann Loveless.  Please arrive at the [facility] (Main Entrance A) at [facility]: [address] [place],  [address] at 7:30 AM.    DIET:  Nothing to eat or drink after midnight except a sip of water with medications (see medication instructions below)  MEDICATION INSTRUCTIONS:  MAY TAKE ALL MEDICATIONS WITH SIPS OF WATER  LABS: Your physician recommends that you return for lab work 2ND WEEK OF [place]  FYI:  For your safety, and to allow [name] to monitor your vital signs accurately during the surgery/procedure we request: If you have artificial nails, gel coating, SNS etc, please have those removed prior to your surgery/procedure. Not having the nail coverings /polish removed may result in cancellation or delay of your surgery/procedure.  You must have a responsible person to drive you home and stay in the waiting area during your procedure. Failure to do so could result in cancellation.  Bring your insurance cards.  *Special Note: Every effort is made to have your procedure done on time. Occasionally there are emergencies that occur at the hospital that may cause delays. Please be patient if a delay does occur.    Your physician recommends that you schedule a follow-up appointment in: [place]

## 2022-02-20 ENCOUNTER — Ambulatory Visit: Payer: Medicare HMO | Admitting: Physician Assistant

## 2022-02-20 DIAGNOSIS — R0683 Snoring: Secondary | ICD-10-CM | POA: Diagnosis not present

## 2022-02-20 DIAGNOSIS — G4733 Obstructive sleep apnea (adult) (pediatric): Secondary | ICD-10-CM | POA: Diagnosis not present

## 2022-02-20 DIAGNOSIS — R339 Retention of urine, unspecified: Secondary | ICD-10-CM | POA: Diagnosis not present

## 2022-02-20 DIAGNOSIS — R319 Hematuria, unspecified: Secondary | ICD-10-CM | POA: Diagnosis not present

## 2022-02-20 DIAGNOSIS — I10 Essential (primary) hypertension: Secondary | ICD-10-CM | POA: Diagnosis not present

## 2022-02-20 DIAGNOSIS — R0681 Apnea, not elsewhere classified: Secondary | ICD-10-CM | POA: Diagnosis not present

## 2022-02-20 DIAGNOSIS — Z8673 Personal history of transient ischemic attack (TIA), and cerebral infarction without residual deficits: Secondary | ICD-10-CM | POA: Diagnosis not present

## 2022-02-20 DIAGNOSIS — I4891 Unspecified atrial fibrillation: Secondary | ICD-10-CM | POA: Diagnosis not present

## 2022-02-21 ENCOUNTER — Encounter (HOSPITAL_BASED_OUTPATIENT_CLINIC_OR_DEPARTMENT_OTHER): Payer: Self-pay

## 2022-02-21 ENCOUNTER — Other Ambulatory Visit: Payer: Self-pay

## 2022-02-21 ENCOUNTER — Encounter: Payer: Self-pay | Admitting: Cardiology

## 2022-02-21 ENCOUNTER — Emergency Department (HOSPITAL_BASED_OUTPATIENT_CLINIC_OR_DEPARTMENT_OTHER)
Admission: EM | Admit: 2022-02-21 | Discharge: 2022-02-21 | Disposition: A | Discharge status: Home / Self Care | Payer: Medicare HMO | Attending: Emergency Medicine | Admitting: Emergency Medicine

## 2022-02-21 DIAGNOSIS — Z8673 Personal history of transient ischemic attack (TIA), and cerebral infarction without residual deficits: Secondary | ICD-10-CM | POA: Insufficient documentation

## 2022-02-21 DIAGNOSIS — R339 Retention of urine, unspecified: Secondary | ICD-10-CM | POA: Diagnosis not present

## 2022-02-21 DIAGNOSIS — T839XXA Unspecified complication of genitourinary prosthetic device, implant and graft, initial encounter: Secondary | ICD-10-CM | POA: Diagnosis not present

## 2022-02-21 DIAGNOSIS — T83098A Other mechanical complication of other indwelling urethral catheter, initial encounter: Secondary | ICD-10-CM | POA: Diagnosis not present

## 2022-02-21 LAB — BASIC METABOLIC PANEL
Anion gap: 11 (ref 5–15)
BUN: 28 mg/dL — ABNORMAL HIGH (ref 8–23)
CO2: 23 mmol/L (ref 22–32)
Calcium: 8.5 mg/dL — ABNORMAL LOW (ref 8.9–10.3)
Chloride: 98 mmol/L (ref 98–111)
Creatinine, Ser: 1.16 mg/dL (ref 0.61–1.24)
GFR, Estimated: >60 mL/min (ref >60)
Glucose, Bld: 167 mg/dL — ABNORMAL HIGH (ref 70–99)
Potassium: 3.5 mmol/L (ref 3.5–5.1)
Sodium: 132 mmol/L — ABNORMAL LOW (ref 135–145)

## 2022-02-21 LAB — CBC
HCT: 38.8 % — ABNORMAL LOW (ref 39.0–52.0)
Hemoglobin: 13.1 g/dL (ref 13.0–17.0)
MCH: 29.1 pg (ref 26.0–34.0)
MCHC: 33.8 g/dL (ref 30.0–36.0)
MCV: 86.2 fL (ref 80.0–100.0)
Platelets: 283 10*3/uL (ref 150–400)
RBC: 4.5 MIL/uL (ref 4.22–5.81)
RDW: 13.4 % (ref 11.5–15.5)
WBC: 15.7 10*3/uL — ABNORMAL HIGH (ref 4.0–10.5)
nRBC: 0 % (ref 0.0–0.2)

## 2022-02-21 LAB — PROTIME-INR
INR: 2.9 — ABNORMAL HIGH (ref 0.8–1.2)
Prothrombin Time: 30.4 seconds — ABNORMAL HIGH (ref 11.4–15.2)

## 2022-02-21 MED ORDER — LIDOCAINE HCL URETHRAL/MUCOSAL 2 % EX GEL: 1.0000 | Freq: Once | CUTANEOUS | Status: DC

## 2022-02-21 NOTE — ED Notes (Signed)
Foley Irrigated with [employer].  Catheter patent and draining well with blood tinged urine.  Pt tolerated well, pericare performed and new brief applied per pt request.  New Statlock placed.

## 2022-02-21 NOTE — ED Provider Notes (Signed)
[place] [facility] [place] Provider Note   CSN: [id number] Arrival date & time: 02/21/22  1533     History  Chief Complaint  Patient presents with   Urinary Retention    [name] is a 77 y.o. male.  Patient here with Foley catheter problem.  He had a Foley catheter placed yesterday.  History of need for Foley catheters in the past.  Has a history of stroke which is why he was recently in the hospital.  He had urinary retention since the stroke.  Otherwise he has been doing well.  He was able to empty his Foley catheter back twice this morning but since 1030 this morning he has been unable to get any urine out.  He started to have some distention in his belly.  Denies any fevers or chills.  Denies any nausea vomiting diarrhea.  The history is provided by the patient.       Home Medications Prior to Admission medications   Medication Sig Start Date End Date Taking? Authorizing Provider  AMBULATORY NON FORMULARY MEDICATION cpap cushions            AirFit F20 (Size: Large)           AirFit F30 (Size: Med)   Please FAX the prescription to:  [phone] 02/10/20   [name], NP  amLODipine (NORVASC) 5 MG tablet TAKE 1 TABLET (5 MG TOTAL) BY MOUTH DAILY. 12/02/21 05/31/22  Lelon Perla, MD  betamethasone dipropionate (DIPROLENE) 0.05 % cream Apply topically 2 (two) times daily as needed. To eczema rash 09/06/18   [name], NP  Blood Glucose Monitoring Suppl Supplies MISC Use for monitoring glucose level 08/05/18   [name], NP  cephALEXin (KEFLEX) 500 MG capsule Take 500 mg by mouth 2 (two) times daily. 02/15/22   [provider]  clopidogrel (PLAVIX) 75 MG tablet TAKE 1 TABLET BY MOUTH EVERY DAY 12/02/21   Lelon Perla, MD  colchicine 0.6 MG tablet TAKE 1 TABLET (0.6 MG TOTAL) BY MOUTH 2 (TWO) TIMES DAILY AS NEEDED. 11/10/21   [name], NP  Continuous Blood Gluc Sensor (DEXCOM G6 SENSOR)  MISC 1 Device by Does not apply route as directed. 05/18/20   Shamleffer, Melanie Crazier, MD  dapagliflozin propanediol (FARXIGA) 10 MG TABS tablet Take 1 tablet (10 mg total) by mouth daily before breakfast. 02/13/22   Shamleffer, Melanie Crazier, MD  fenofibrate (TRICOR) 145 MG tablet TAKE 1 TABLET BY MOUTH EVERY DAY 11/08/21   [name], NP  furosemide (LASIX) 20 MG tablet TAKE 1 TABLET BY MOUTH EVERY DAY AS NEEDED FOR SWELLING 11/08/21   [name], NP  glucose blood (ONETOUCH VERIO) test strip Use as instructed 09/15/20   [name], NP  insulin aspart (NOVOLOG FLEXPEN) 100 UNIT/ML FlexPen Max daily 75 units 02/13/22   Shamleffer, Melanie Crazier, MD  Insulin Glargine (BASAGLAR KWIKPEN) 100 UNIT/ML Inject 22 Units into the skin daily. 02/13/22   Shamleffer, Melanie Crazier, MD  Insulin Pen Needle 32G X 4 MM MISC 1 Device by Does not apply route in the morning, at noon, in the evening, and at bedtime. 02/13/22   Shamleffer, Melanie Crazier, MD  isosorbide mononitrate (IMDUR) 30 MG 24 hr tablet Take 30 mg by mouth daily. 12/02/21   [provider]  metoprolol succinate (TOPROL-XL) 25 MG 24 hr tablet TAKE 1 TABLET BY MOUTH EVERY DAY 11/08/21   [name], NP  OneTouch Delica Lancets 47S MISC USE AS DIRECTED  09/15/20   [name], NP  rosuvastatin (CRESTOR) 40 MG tablet TAKE 1 TABLET BY MOUTH EVERY DAY 11/30/21   Lelon Perla, MD  tacrolimus (PROTOPIC) 0.1 % ointment Apply 1 application  topically daily as needed (irritation). 11/29/20   [provider]  tamsulosin (FLOMAX) 0.4 MG CAPS capsule Take 1 capsule (0.4 mg total) by mouth daily. 01/28/22   Otelia Santee, NP  warfarin (COUMADIN) 7.5 MG tablet TAKE 1/2 A TABLET TO 1 TABLET BY MOUTH DAILY AS DIRECTED BY THE COUMADIN CLINIC 09/26/21   Lelon Perla, MD      Allergies    Patient has no known allergies.    Review of Systems   Review of Systems  Physical Exam Updated Vital  Signs BP 139/71 (BP Location: Left Arm)   Pulse 78   Temp 98.4 F (36.9 C) (Oral)   Resp 20   Ht '5\' 9"'$  (1.753 m)   Wt 96.6 kg   SpO2 98%   BMI 31.45 kg/m  Physical Exam Vitals and nursing note reviewed.  Constitutional:      General: He is not in acute distress.    Appearance: He is well-developed.  HENT:     Head: Normocephalic and atraumatic.  Eyes:     Extraocular Movements: Extraocular movements intact.     Conjunctiva/sclera: Conjunctivae normal.     Pupils: Pupils are equal, round, and reactive to light.  Cardiovascular:     Rate and Rhythm: Normal rate and regular rhythm.     Pulses: Normal pulses.     Heart sounds: Normal heart sounds. No murmur heard. Pulmonary:     Effort: Pulmonary effort is normal. No respiratory distress.     Breath sounds: Normal breath sounds.  Abdominal:     General: There is distension.     Palpations: Abdomen is soft.     Tenderness: There is no abdominal tenderness.  Musculoskeletal:        General: No swelling.     Cervical back: Neck supple.  Skin:    General: Skin is warm and dry.     Capillary Refill: Capillary refill takes less than 2 seconds.  Neurological:     Mental Status: He is alert.  Psychiatric:        Mood and Affect: Mood normal.     ED Results / Procedures / Treatments   Labs (all labs ordered are listed, but only abnormal results are displayed) Labs Reviewed  BASIC METABOLIC PANEL - Abnormal; Notable for the following components:      Result Value   Sodium 132 (*)    Glucose, Bld 167 (*)    BUN 28 (*)    Calcium 8.5 (*)    All other components within normal limits  CBC - Abnormal; Notable for the following components:   WBC 15.7 (*)    HCT 38.8 (*)    All other components within normal limits  PROTIME-INR - Abnormal; Notable for the following components:   Prothrombin Time 30.4 (*)    INR 2.9 (*)    All other components within normal limits    EKG None  Radiology No results  found.  Procedures Procedures    Medications Ordered in ED Medications  lidocaine (XYLOCAINE) 2 % jelly 1 Application (has no administration in time range)    ED Course/ Medical Decision Making/ A&P  Medical Decision Making Amount and/or Complexity of Data Reviewed Labs: ordered.   [name] is here with Foley catheter issue.  Normal vitals.  No fever.  He has had a Foley catheter in since stroke late last year.  He had Foley replaced yesterday but now having issues after being able to empty his Foley catheter this morning.  Patient is on Plavix and Coumadin.  He has blood-tinged urine within his Foley bag but bladder scan showed over 700 cc of urine in his bladder.  He is already had blood work that is unremarkable.  I am not concerned for infectious process.  I reviewed interpreted labs and there is no significant anemia or electrolyte abnormality.  Will have nursing staff try to troubleshoot his current Foley catheter but otherwise will likely replace.  Patient had his catheter flushed and resolve the issue with his catheter.  He put out over a liter of urine.  He is feeling much better.  Will follow-up with urology.  Discharged in good condition.  This chart was dictated using voice recognition software.  Despite best efforts to proofread,  errors can occur which can change the documentation meaning.         Final Clinical Impression(s) / ED Diagnoses Final diagnoses:  Urinary retention  Problem with Foley catheter, initial encounter [facility])    Rx / [place] Orders ED Discharge Orders     None         Lennice Sites, DO 02/21/22 1803

## 2022-02-21 NOTE — ED Notes (Signed)
Pt arrived with indwelling catheter

## 2022-02-21 NOTE — ED Notes (Signed)
Bladder scan 726m

## 2022-02-21 NOTE — ED Triage Notes (Addendum)
Pt reports he had a foley catheter placed yesterday due to urinary retention. He reports he has had no urine output since 10:30am today and he feels the urge to urinate but is unable to.  The foley was placed by a hospital in [place] when they were there visiting. His urine was bloody at that time and had to have it "flushed out" yesterday.  He has a hx of stroke on 01/24/2022 and also had urinary retention then and a foley placed for 10 days at that time. Since then he has had recurrent UTIs and trouble voiding. He is followed by [facility].

## 2022-02-22 ENCOUNTER — Ambulatory Visit: Payer: Medicare HMO

## 2022-02-22 ENCOUNTER — Ambulatory Visit (INDEPENDENT_AMBULATORY_CARE_PROVIDER_SITE_OTHER): Payer: Medicare HMO

## 2022-02-22 DIAGNOSIS — Z5181 Encounter for therapeutic drug level monitoring: Secondary | ICD-10-CM

## 2022-02-22 DIAGNOSIS — N3000 Acute cystitis without hematuria: Secondary | ICD-10-CM | POA: Diagnosis not present

## 2022-02-22 DIAGNOSIS — R338 Other retention of urine: Secondary | ICD-10-CM | POA: Diagnosis not present

## 2022-02-22 NOTE — Patient Instructions (Signed)
Description   Continue taking warfarin 1 tablet daily except for 1/2 tablet on Mondays, Wednesdays, and Fridays.  Continue consistency with leafy green intake.  Recheck INR in 1 week.  Coumadin Clinic [phone].

## 2022-02-23 ENCOUNTER — Ambulatory Visit: Payer: Self-pay

## 2022-02-23 ENCOUNTER — Telehealth: Payer: Self-pay

## 2022-02-23 NOTE — Patient Instructions (Signed)
Visit Information  Thank you for taking time to visit with me today. Please don't hesitate to contact me if I can be of assistance to you.   Following are the goals we discussed today:   Goals Addressed             This Visit's Progress    Health Management       Care Coordination Interventions: health management post hospitalization: 01/24/22-01/27/22 CVA Evaluation of current treatment plan related to stroke and patient's adherence to plan as established by provider Discussed patient self perception of health status at this time. Mr. Postlewaite reports primary problems has been urinary retention requiring foley catheter and UTI. He states today first day urine has been yellow with no blood. He reports follow up with urologist on 03/20/22. Frequently refers to his wife for confirmation of events and appointments. He reports is better and is without any concerns at this time. Reviewed upcoming/scheduled appointments Provided positive feedback regarding self management of health        Our next appointment is by telephone on 03/24/22 at 9:00 am  Please call the care guide team at [phone] if you need to cancel or reschedule your appointment.   If you are experiencing a Mental Health or [place] or need someone to talk to, please call the Suicide and Crisis Lifeline: [place], RN, MSN, BSN, [place] [phone]

## 2022-02-23 NOTE — Patient Outreach (Signed)
  Care Coordination   Follow Up Visit Note   02/23/2022 Name: [name] MRN: [id number] DOB: 1945/03/18  [name] is a 77 y.o. year old male who sees [name], NP for primary care. I spoke with  [name] by phone today.  What matters to the patients health and wellness today?  Reports urinary retention has foley catheter-ED visit on 02/21/22. Follow up with urologist on yesterday. [name] reports larger catheter placed at urology office and states prescribed antibiotic for UTI. States this morning urine clear no blood. He states he is going follow up coumadin clinic on Tuesday 02/28/22. And is scheduled for a TEE on 03/14/22.   Goals Addressed             This Visit's Progress    Health Management       Care Coordination Interventions: health management post hospitalization: 01/24/22-01/27/22 CVA Evaluation of current treatment plan related to stroke and patient's adherence to plan as established by provider Discussed patient self perception of health status at this time. [name] reports primary problems has been urinary retention requiring foley catheter and UTI. He states today first day urine has been yellow with no blood. He reports follow up with urologist on 03/20/22. Frequently refers to his wife for confirmation of events and appointments. He reports is better and is without any concerns at this time. Reviewed upcoming/scheduled appointments Provided positive feedback regarding self management of health        SDOH assessments and interventions completed:  No  Care Coordination Interventions:  Yes, provided   Follow up plan: Follow up call scheduled for 03/24/22    Encounter Outcome:  Pt. Visit Completed   Thea Silversmith, RN, MSN, BSN, [place] Coordinator [phone]

## 2022-02-23 NOTE — Telephone Encounter (Signed)
Received call from pt's wife stating pt was prescribed bactrim. Since bactrim will increase INR, instructed for pt to increase green intake and scheduled INR appt for Tuesday, 02/28/22.

## 2022-02-26 DIAGNOSIS — I10 Essential (primary) hypertension: Secondary | ICD-10-CM | POA: Diagnosis not present

## 2022-02-26 DIAGNOSIS — G4733 Obstructive sleep apnea (adult) (pediatric): Secondary | ICD-10-CM | POA: Diagnosis not present

## 2022-02-28 ENCOUNTER — Encounter: Payer: Self-pay | Admitting: Physician Assistant

## 2022-02-28 ENCOUNTER — Ambulatory Visit: Payer: Medicare HMO | Attending: Cardiology | Admitting: *Deleted

## 2022-02-28 DIAGNOSIS — Z952 Presence of prosthetic heart valve: Secondary | ICD-10-CM

## 2022-02-28 DIAGNOSIS — Z7901 Long term (current) use of anticoagulants: Secondary | ICD-10-CM | POA: Diagnosis not present

## 2022-02-28 LAB — POCT INR: INR: 3.4 — AB (ref 2.0–3.0)

## 2022-02-28 NOTE — Patient Instructions (Signed)
Description   Continue taking warfarin 1 tablet daily except for 1/2 tablet on Mondays, Wednesdays, and Fridays. Continue consistency with leafy green intake.  Recheck INR in 1 week. Coumadin Clinic [phone].

## 2022-03-02 DIAGNOSIS — R338 Other retention of urine: Secondary | ICD-10-CM | POA: Diagnosis not present

## 2022-03-03 DIAGNOSIS — R338 Other retention of urine: Secondary | ICD-10-CM | POA: Diagnosis not present

## 2022-03-06 DIAGNOSIS — R338 Other retention of urine: Secondary | ICD-10-CM | POA: Diagnosis not present

## 2022-03-07 ENCOUNTER — Ambulatory Visit: Payer: Medicare HMO | Admitting: Physician Assistant

## 2022-03-07 ENCOUNTER — Encounter: Payer: Self-pay | Admitting: Physician Assistant

## 2022-03-07 DIAGNOSIS — Z8673 Personal history of transient ischemic attack (TIA), and cerebral infarction without residual deficits: Secondary | ICD-10-CM | POA: Diagnosis not present

## 2022-03-08 LAB — CBC
Hematocrit: 41.2 % (ref 37.5–51.0)
Hemoglobin: 13.4 g/dL (ref 13.0–17.7)
MCH: 28.7 pg (ref 26.6–33.0)
MCHC: 32.5 g/dL (ref 31.5–35.7)
MCV: 88 fL (ref 79–97)
Platelets: 337 10*3/uL (ref 150–450)
RBC: 4.67 x10E6/uL (ref 4.14–5.80)
RDW: 13.2 % (ref 11.6–15.4)
WBC: 8.4 10*3/uL (ref 3.4–10.8)

## 2022-03-08 LAB — BASIC METABOLIC PANEL
BUN/Creatinine Ratio: 21 (ref 10–24)
BUN: 28 mg/dL — ABNORMAL HIGH (ref 8–27)
CO2: 22 mmol/L (ref 20–29)
Calcium: 8.9 mg/dL (ref 8.6–10.2)
Chloride: 98 mmol/L (ref 96–106)
Creatinine, Ser: 1.35 mg/dL — ABNORMAL HIGH (ref 0.76–1.27)
Glucose: 213 mg/dL — ABNORMAL HIGH (ref 70–99)
Potassium: 4 mmol/L (ref 3.5–5.2)
Sodium: 137 mmol/L (ref 134–144)
eGFR: 54 mL/min/{1.73_m2} — ABNORMAL LOW (ref >59)

## 2022-03-09 ENCOUNTER — Ambulatory Visit: Payer: Medicare HMO | Attending: Cardiology | Admitting: *Deleted

## 2022-03-09 DIAGNOSIS — Z952 Presence of prosthetic heart valve: Secondary | ICD-10-CM

## 2022-03-09 DIAGNOSIS — Z7901 Long term (current) use of anticoagulants: Secondary | ICD-10-CM

## 2022-03-09 LAB — POCT INR: INR: 2.9 (ref 2.0–3.0)

## 2022-03-09 NOTE — Patient Instructions (Signed)
Description   Continue taking warfarin 1 tablet daily except for 1/2 tablet on Mondays, Wednesdays, and Fridays. Continue consistency with leafy green intake.  Recheck INR in 2 weeks. Coumadin Clinic [phone].

## 2022-03-13 ENCOUNTER — Encounter: Payer: Self-pay | Admitting: *Deleted

## 2022-03-14 ENCOUNTER — Ambulatory Visit (HOSPITAL_BASED_OUTPATIENT_CLINIC_OR_DEPARTMENT_OTHER): Payer: Medicare HMO | Admitting: Anesthesiology

## 2022-03-14 ENCOUNTER — Ambulatory Visit (HOSPITAL_BASED_OUTPATIENT_CLINIC_OR_DEPARTMENT_OTHER): Payer: Medicare HMO

## 2022-03-14 ENCOUNTER — Ambulatory Visit (HOSPITAL_COMMUNITY): Payer: Medicare HMO | Admitting: Anesthesiology

## 2022-03-14 ENCOUNTER — Encounter (HOSPITAL_COMMUNITY)
Admission: RE | Disposition: A | Discharge status: Home / Self Care | Payer: Self-pay | Source: Home / Self Care | Attending: Internal Medicine

## 2022-03-14 ENCOUNTER — Ambulatory Visit (HOSPITAL_COMMUNITY)
Admission: RE | Admit: 2022-03-14 | Discharge: 2022-03-14 | Disposition: A | Discharge status: Home / Self Care | Payer: Medicare HMO | Attending: Internal Medicine | Admitting: Internal Medicine

## 2022-03-14 ENCOUNTER — Encounter (HOSPITAL_COMMUNITY): Payer: Self-pay | Admitting: Internal Medicine

## 2022-03-14 ENCOUNTER — Other Ambulatory Visit: Payer: Self-pay

## 2022-03-14 DIAGNOSIS — Z8673 Personal history of transient ischemic attack (TIA), and cerebral infarction without residual deficits: Secondary | ICD-10-CM | POA: Insufficient documentation

## 2022-03-14 DIAGNOSIS — I10 Essential (primary) hypertension: Secondary | ICD-10-CM | POA: Diagnosis not present

## 2022-03-14 DIAGNOSIS — I712 Thoracic aortic aneurysm, without rupture, unspecified: Secondary | ICD-10-CM | POA: Insufficient documentation

## 2022-03-14 DIAGNOSIS — Z87891 Personal history of nicotine dependence: Secondary | ICD-10-CM | POA: Diagnosis not present

## 2022-03-14 DIAGNOSIS — Z951 Presence of aortocoronary bypass graft: Secondary | ICD-10-CM | POA: Insufficient documentation

## 2022-03-14 DIAGNOSIS — I2510 Atherosclerotic heart disease of native coronary artery without angina pectoris: Secondary | ICD-10-CM | POA: Diagnosis not present

## 2022-03-14 DIAGNOSIS — I340 Nonrheumatic mitral (valve) insufficiency: Secondary | ICD-10-CM

## 2022-03-14 DIAGNOSIS — Z7901 Long term (current) use of anticoagulants: Secondary | ICD-10-CM | POA: Insufficient documentation

## 2022-03-14 DIAGNOSIS — E119 Type 2 diabetes mellitus without complications: Secondary | ICD-10-CM | POA: Diagnosis not present

## 2022-03-14 DIAGNOSIS — N183 Chronic kidney disease, stage 3 unspecified: Secondary | ICD-10-CM | POA: Insufficient documentation

## 2022-03-14 DIAGNOSIS — Z952 Presence of prosthetic heart valve: Secondary | ICD-10-CM | POA: Insufficient documentation

## 2022-03-14 DIAGNOSIS — R911 Solitary pulmonary nodule: Secondary | ICD-10-CM | POA: Diagnosis not present

## 2022-03-14 DIAGNOSIS — I129 Hypertensive chronic kidney disease with stage 1 through stage 4 chronic kidney disease, or unspecified chronic kidney disease: Secondary | ICD-10-CM | POA: Insufficient documentation

## 2022-03-14 DIAGNOSIS — Z79899 Other long term (current) drug therapy: Secondary | ICD-10-CM | POA: Diagnosis not present

## 2022-03-14 DIAGNOSIS — R69 Illness, unspecified: Secondary | ICD-10-CM | POA: Diagnosis not present

## 2022-03-14 DIAGNOSIS — I700 Atherosclerosis of aorta: Secondary | ICD-10-CM

## 2022-03-14 DIAGNOSIS — Z7984 Long term (current) use of oral hypoglycemic drugs: Secondary | ICD-10-CM | POA: Diagnosis not present

## 2022-03-14 DIAGNOSIS — I480 Paroxysmal atrial fibrillation: Secondary | ICD-10-CM | POA: Insufficient documentation

## 2022-03-14 DIAGNOSIS — Z794 Long term (current) use of insulin: Secondary | ICD-10-CM | POA: Diagnosis not present

## 2022-03-14 DIAGNOSIS — I083 Combined rheumatic disorders of mitral, aortic and tricuspid valves: Secondary | ICD-10-CM | POA: Diagnosis not present

## 2022-03-14 DIAGNOSIS — F17210 Nicotine dependence, cigarettes, uncomplicated: Secondary | ICD-10-CM

## 2022-03-14 DIAGNOSIS — I639 Cerebral infarction, unspecified: Secondary | ICD-10-CM | POA: Diagnosis not present

## 2022-03-14 DIAGNOSIS — G4733 Obstructive sleep apnea (adult) (pediatric): Secondary | ICD-10-CM | POA: Insufficient documentation

## 2022-03-14 DIAGNOSIS — E1122 Type 2 diabetes mellitus with diabetic chronic kidney disease: Secondary | ICD-10-CM | POA: Insufficient documentation

## 2022-03-14 DIAGNOSIS — I252 Old myocardial infarction: Secondary | ICD-10-CM

## 2022-03-14 DIAGNOSIS — Z7902 Long term (current) use of antithrombotics/antiplatelets: Secondary | ICD-10-CM | POA: Diagnosis not present

## 2022-03-14 HISTORY — PX: TEE WITHOUT CARDIOVERSION: SHX5443

## 2022-03-14 LAB — GLUCOSE, CAPILLARY: Glucose-Capillary: 161 mg/dL — ABNORMAL HIGH (ref 70–99)

## 2022-03-14 SURGERY — ECHOCARDIOGRAM, TRANSESOPHAGEAL
Anesthesia: Monitor Anesthesia Care

## 2022-03-14 MED ORDER — SODIUM CHLORIDE 0.9 % IV SOLN: INTRAVENOUS | Status: DC

## 2022-03-14 MED ORDER — SODIUM CHLORIDE BACTERIOSTATIC 0.9 % IJ SOLN
INTRAMUSCULAR | Status: DC | PRN
  Administered 2022-03-14: 9 mL via INTRAVENOUS

## 2022-03-14 MED ORDER — EPHEDRINE SULFATE-NACL 50-0.9 MG/10ML-% IV SOSY
PREFILLED_SYRINGE | INTRAVENOUS | Status: DC | PRN
  Administered 2022-03-14 (×2): 5 mg via INTRAVENOUS

## 2022-03-14 MED ORDER — PROPOFOL 500 MG/50ML IV EMUL
INTRAVENOUS | Status: DC | PRN
  Administered 2022-03-14: 150 ug/kg/min via INTRAVENOUS

## 2022-03-14 MED ORDER — BUTAMBEN-TETRACAINE-BENZOCAINE 2-2-14 % EX AERO
INHALATION_SPRAY | CUTANEOUS | Status: DC | PRN
  Administered 2022-03-14: 1 via TOPICAL

## 2022-03-14 MED ORDER — PHENYLEPHRINE 80 MCG/ML (10ML) SYRINGE FOR IV PUSH (FOR BLOOD PRESSURE SUPPORT)
PREFILLED_SYRINGE | INTRAVENOUS | Status: DC | PRN
  Administered 2022-03-14 (×7): 80 ug via INTRAVENOUS

## 2022-03-14 MED ORDER — LIDOCAINE 2% (20 MG/ML) 5 ML SYRINGE
INTRAMUSCULAR | Status: DC | PRN
  Administered 2022-03-14: 50 mg via INTRAVENOUS

## 2022-03-14 NOTE — Progress Notes (Signed)
  Echocardiogram Echocardiogram Transesophageal has been performed.  Darlina Sicilian M 03/14/2022, 10:24 AM

## 2022-03-14 NOTE — Interval H&P Note (Signed)
History and Physical Interval Note:  03/14/2022 8:34 AM  [name]  has presented today for surgery, with the diagnosis of STROKE.  The various methods of treatment have been discussed with the patient and family. After consideration of risks, benefits and other options for treatment, the patient has consented to  Procedure(s): TRANSESOPHAGEAL ECHOCARDIOGRAM (TEE) (N/A) as a surgical intervention.  The patient's history has been reviewed, patient examined, no change in status, stable for surgery.  I have reviewed the patient's chart and labs.  Questions were answered to the patient's satisfaction.     [name]

## 2022-03-14 NOTE — Anesthesia Postprocedure Evaluation (Signed)
Anesthesia Post Note  Patient: PIKE AVELLA  Procedure(s) Performed: TRANSESOPHAGEAL ECHOCARDIOGRAM (TEE)     Patient location during evaluation: PACU Anesthesia Type: MAC Level of consciousness: awake and alert Pain management: pain level controlled Vital Signs Assessment: post-procedure vital signs reviewed and stable Respiratory status: spontaneous breathing, nonlabored ventilation, respiratory function stable and patient connected to nasal cannula oxygen Cardiovascular status: stable and blood pressure returned to baseline Postop Assessment: no apparent nausea or vomiting Anesthetic complications: no  No notable events documented.  Last Vitals:  Vitals:   03/14/22 1020 03/14/22 1028  BP: 128/69   Pulse: (!) 54 (!) 54  Resp: 15 17  Temp:    SpO2: 97% 96%    Last Pain:  Vitals:   03/14/22 1028  TempSrc:   PainSc: 0-No pain                 Effie Berkshire

## 2022-03-14 NOTE — Anesthesia Preprocedure Evaluation (Signed)
Anesthesia Evaluation  Patient identified by MRN, date of birth, ID band Patient awake    Reviewed: Allergy & Precautions, NPO status , Patient's Chart, lab work & pertinent test results  History of Anesthesia Complications (+) PONV and history of anesthetic complications  Airway Mallampati: II  TM Distance: >3 FB Neck ROM: Full    Dental  (+) Partial Lower   Pulmonary sleep apnea , Current Smoker   breath sounds clear to auscultation       Cardiovascular hypertension, Pt. on medications and Pt. on home beta blockers + Past MI and + CABG  + Valvular Problems/Murmurs  Rhythm:Regular Rate:Normal  Echo:  1. Left ventricular ejection fraction, by estimation, is 50 to 55%. The  left ventricle has low normal function. The left ventricle has no regional  wall motion abnormalities. There is mild asymmetric left ventricular  hypertrophy of the basal and septal  segments. Left ventricular diastolic parameters were normal.   2. Right ventricular systolic function is normal. The right ventricular  size is normal.   3. Left atrial size was moderately dilated.   4. The mitral valve is abnormal. Trivial mitral valve regurgitation. No  evidence of mitral stenosis.   5. Mean gradient across AV 8 peak 11 mmHg AVA 2.5 cm 2 no stenosis  Mechanical AVR not well seen and no records of valve type . The aortic  valve has been repaired/replaced. There is moderate calcification of the  aortic valve. There is moderate thickening   of the aortic valve. Aortic valve regurgitation is not visualized. Aortic  valve sclerosis/calcification is present, without any evidence of aortic  stenosis.   6. Aortic dilatation noted. There is severe dilatation of the ascending  aorta, measuring 46 mm.   7. The inferior vena cava is normal in size with greater than 50%  respiratory variability, suggesting right atrial pressure of 3 mmHg.     Neuro/Psych CVA     GI/Hepatic negative GI ROS, Neg liver ROS,,,  Endo/Other  diabetes, Type 2, Insulin Dependent    Renal/GU Renal disease     Musculoskeletal  (+) Arthritis ,    Abdominal   Peds  Hematology negative hematology ROS (+)   Anesthesia Other Findings   Reproductive/Obstetrics                             Anesthesia Physical Anesthesia Plan  ASA: 3  Anesthesia Plan: MAC   Post-op Pain Management: Minimal or no pain anticipated   Induction: Intravenous  PONV Risk Score and Plan: 0 and Propofol infusion  Airway Management Planned: Natural Airway and Simple Face Mask  Additional Equipment: None  Intra-op Plan:   Post-operative Plan:   Informed Consent: I have reviewed the patients History and Physical, chart, labs and discussed the procedure including the risks, benefits and alternatives for the proposed anesthesia with the patient or authorized representative who has indicated his/her understanding and acceptance.       Plan Discussed with: CRNA  Anesthesia Plan Comments:        Anesthesia Quick Evaluation

## 2022-03-14 NOTE — Transfer of Care (Signed)
Immediate Anesthesia Transfer of Care Note  Patient: [name]  Procedure(s) Performed: TRANSESOPHAGEAL ECHOCARDIOGRAM (TEE)  Patient Location: Endoscopy Unit  Anesthesia Type:MAC  Level of Consciousness: awake, alert , and oriented  Airway & Oxygen Therapy: Patient Spontanous Breathing  Post-op Assessment: Report given to RN, Post -op Vital signs reviewed and stable, and Patient moving all extremities X 4  Post vital signs: Reviewed and stable  Last Vitals:  Vitals Value Taken Time  BP 113/64 03/14/22 0957  Temp 36.2 C 03/14/22 0957  Pulse 59 03/14/22 0958  Resp 12 03/14/22 0958  SpO2 95 % 03/14/22 0958  Vitals shown include unvalidated device data.  Last Pain:  Vitals:   03/14/22 0957  TempSrc: Tympanic  PainSc: 0-No pain         Complications: No notable events documented.

## 2022-03-14 NOTE — Discharge Instructions (Signed)
TEE  YOU HAD AN CARDIAC PROCEDURE TODAY: Refer to the procedure report and other information in the discharge instructions given to you for any specific questions about what was found during the examination. If this information does not answer your questions, please call [place] office at [phone] to clarify.   DIET: Your first meal following the procedure should be a light meal and then it is ok to progress to your normal diet. A half-sandwich or bowl of soup is an example of a good first meal. Heavy or fried foods are harder to digest and may make you feel nauseous or bloated. Drink plenty of fluids but you should avoid alcoholic beverages for 24 hours. If you had a esophageal dilation, please see attached instructions for diet.   ACTIVITY: Your care partner should take you home directly after the procedure. You should plan to take it easy, moving slowly for the rest of the day. You can resume normal activity the day after the procedure however YOU SHOULD NOT DRIVE, use power tools, machinery or perform tasks that involve climbing or major physical exertion for 24 hours (because of the sedation medicines used during the test).   SYMPTOMS TO REPORT IMMEDIATELY: A cardiologist can be reached at any hour. Please call [phone] for any of the following symptoms:  Vomiting of blood or coffee ground material  New, significant abdominal pain  New, significant chest pain or pain under the shoulder blades  Painful or persistently difficult swallowing  New shortness of breath  Black, tarry-looking or red, bloody stools  FOLLOW UP:  Please also call with any specific questions about appointments or follow up tests. TEE  YOU HAD AN CARDIAC PROCEDURE TODAY: Refer to the procedure report and other information in the discharge instructions given to you for any specific questions about what was found during the examination. If this information does not answer your questions, please call [place] office at [phone] to clarify.   DIET: Your first meal following the procedure should be a light meal and then it is ok to progress to your normal diet. A half-sandwich or bowl of soup is an example of a good first meal. Heavy or fried foods are harder to digest and may make you feel nauseous or bloated. Drink plenty of fluids but you should avoid alcoholic beverages for 24 hours. If you had a esophageal dilation, please see attached instructions for diet.   ACTIVITY: Your care partner should take you home directly after the procedure. You should plan to take it easy, moving slowly for the rest of the day. You can resume normal activity the day after the procedure however YOU SHOULD NOT DRIVE, use power tools, machinery or perform tasks that involve climbing or major physical exertion for 24 hours (because of the sedation medicines used during the test).   SYMPTOMS TO REPORT IMMEDIATELY: A cardiologist can be reached at any hour. Please call [phone] for any of the following symptoms:  Vomiting of blood or coffee ground material  New, significant abdominal pain  New, significant chest pain or pain under the shoulder blades  Painful or persistently difficult swallowing  New shortness of breath  Black, tarry-looking or red, bloody stools  FOLLOW UP:  Please also call with any specific questions about appointments or follow up tests.

## 2022-03-14 NOTE — Anesthesia Procedure Notes (Signed)
Procedure Name: MAC Date/Time: 03/14/2022 8:50 AM  Performed by: Kyung Rudd, CRNAPre-anesthesia Checklist: Patient identified, Emergency Drugs available, Suction available and Patient being monitored Patient Re-evaluated:Patient Re-evaluated prior to induction Oxygen Delivery Method: Nasal cannula Induction Type: IV induction Placement Confirmation: positive ETCO2 Dental Injury: Teeth and Oropharynx as per pre-operative assessment

## 2022-03-14 NOTE — CV Procedure (Signed)
INDICATIONS: Stroke  PROCEDURE:   Informed consent was obtained prior to the procedure. The risks, benefits and alternatives for the procedure were discussed and the patient comprehended these risks.  Risks include, but are not limited to, cough, sore throat, vomiting, nausea, somnolence, esophageal and stomach trauma or perforation, bleeding, low blood pressure, aspiration, pneumonia, infection, trauma to the teeth and death.    After a procedural time-out, the oropharynx was anesthetized with 20% benzocaine spray.   During this procedure the patient was administered propofol per anesthesia.  The patient's heart rate, blood pressure, and oxygen saturation were monitored continuously during the procedure. The period of conscious sedation was 30 minutes, of which I was present face-to-face 100% of this time.  The transesophageal probe was inserted in the esophagus and stomach without difficulty and multiple views were obtained.  The patient was kept under observation until the patient left the procedure room.  The patient left the procedure room in stable condition.   Agitated microbubble saline contrast was administered.  COMPLICATIONS:    There were no immediate complications.  FINDINGS:   FORMAL ECHOCARDIOGRAM REPORT PENDING Grossly normal structure and function of the mechanical aortic prosthesis, 23 mm st jude bileaflet.  Residual basal septal hypertrophy with chordal SAM. Mild MR. Degenerative mitral valve. No definite LVOT obstruction on multiple interrogations.  LA appendage not clearly visualized, may have been surgically ligated. No interatrial shunt. Moderate-severe aortic atherosclerosis.   RECOMMENDATIONS:    D/c when alert.  Time Spent Directly with the Patient:  60 minutes   [name] 03/14/2022, 9:46 AM

## 2022-03-15 ENCOUNTER — Encounter (HOSPITAL_COMMUNITY): Payer: Self-pay | Admitting: Internal Medicine

## 2022-03-15 DIAGNOSIS — Z8546 Personal history of malignant neoplasm of prostate: Secondary | ICD-10-CM | POA: Diagnosis not present

## 2022-03-15 DIAGNOSIS — R338 Other retention of urine: Secondary | ICD-10-CM | POA: Diagnosis not present

## 2022-03-16 LAB — ECHO TEE
AR max vel: 3.99 cm2
AV Area VTI: 3.96 cm2
AV Area mean vel: 3.58 cm2
AV Mean grad: 10.4 mmHg
AV Peak grad: 15.5 mmHg
Ao pk vel: 1.97 m/s
MV M vel: 3.95 m/s
MV Peak grad: 62.4 mmHg
MV VTI: 8.73 cm2
P 1/2 time: 356 msec
Radius: 0.4 cm

## 2022-03-17 ENCOUNTER — Encounter: Payer: Self-pay | Admitting: Cardiology

## 2022-03-20 DIAGNOSIS — E119 Type 2 diabetes mellitus without complications: Secondary | ICD-10-CM | POA: Diagnosis not present

## 2022-03-21 ENCOUNTER — Encounter: Payer: Self-pay | Admitting: Physician Assistant

## 2022-03-21 ENCOUNTER — Ambulatory Visit: Payer: Medicare HMO | Admitting: Physician Assistant

## 2022-03-21 VITALS — BP 104/64 | HR 82 | Resp 18 | Ht 69.0 in | Wt 211.0 lb

## 2022-03-21 DIAGNOSIS — I63412 Cerebral infarction due to embolism of left middle cerebral artery: Secondary | ICD-10-CM | POA: Diagnosis not present

## 2022-03-21 DIAGNOSIS — R413 Other amnesia: Secondary | ICD-10-CM

## 2022-03-21 NOTE — Addendum Note (Signed)
Addended by: Reather Laurence on: 03/21/2022 12:59 PM   Modules accepted: Orders

## 2022-03-21 NOTE — Progress Notes (Signed)
+   Assessment/Plan:   Memory Difficulties of multiple etiologies  Embolic stroke involving the LMCA with mild residual R sided weakness, mild dysarthria  [name] is a very pleasant 77 y.o. RH male with a history of hypertension, hyperlipidemia, DM2, prior history of CVA while on Coumadin with dysarthria and B leg weakness, OSA, St 3 CKD, CAD s/p CABG, s/p AVR on Plavix, h/o prostate cancer, h/o bradycardia, hemochromatosis, B hearing loss seen today in follow up for memory loss.  Neuropsych evaluation 09/02/21 Briefly, results suggested neuropsychological functioning largely within normal limits relative to age-matched peers to be continued to be monitored over time, but this remain currently stable.  Since his last visit, he had a recent admission on 01/24/22 for acute ischemic stroke due to L MCA occlusion, likely thrombotic due to mechanical heart valve and non therapeutic coumadin following a recent long distance trip to [place] , and then after Christmas, manifesting global aphasia and R sided weakness, L gaze deviation, now with improvement of symptoms. Initially he was NIH 2. He is s/p TICI 3 with revascularization 12/2021. He is now on Plavix  75 mg daily, and Coumadin 7.5 mg as directed (goal INR 2.5-3.5).in 09/2021 had  MI while in [place].  This is being followed by Cardiology. Denies any cardiac complaints    Recommendations   Follow up in 1 year  Continue CPAP for OSA  Recommend hearing aids for hearing loss Recommend increasing physical activity and socialization  Recommend good control of cardiovascular risk factors.   Continue Plavix and Coumadin as per Cards Referral to OT/PT/ST for strength, balance and speech improvement      Subjective:    This patient is accompanied in the office by his wife who supplements the history.  Previous records as well as any outside records available were reviewed prior to todays visit. Patient was last seen on 07/26/21 at which time his MoCA  was 28/30     Any changes in memory since last visit? About the same. Continues to do crosswords and NYT.  repeats oneself? Denies  Disoriented when walking into a room?  Patient denies     Wandering behavior?  denies   Mood changes? Denies, no depression is reported Hallucinations or paranoia?  denies   Seizures?    denies    Any sleep changes?  Denies vivid dreams, REM behavior or sleepwalking . Sleeps through the night with the catheter "so I don't have to get up".   Any hygiene concerns?    denies   Independent of bathing and dressing?  Endorsed  Any changes in appetite?  denies     Patient have trouble swallowing?  denies   Any headaches?   denies   Chronic back pain  denies   Ambulates with difficulty?  Slight R  sided  residual weakness,needs the walker for stability and safety but is able to walk without it as well. HE has some dizziness "becomes how" pending on the cardiac status, rate.  R hand shows some tremors when trying to hold an object, otherwise no visible tremors. Thus, uses the L hand to eat.Marland Kitchen He is very fatigued, deconditioned   Recent falls or head injuries? denies      Vision changes: Not at this time, had some disconjugation during stroke. HE has an appt with ophthalmology next week  Has catheter , had UTI worse since , tried void, unable to pee. Has 3/8  [name] prostate. Cystoscopy as well  Any tremors?  denies  Any anosmia?  Patient denies   Any incontinence of urine?  Urine retention on catheter failing void trials. Has a f/u with Urology.  Any bowel dysfunction?  Denies    Patient lives  with wife  Does the patient drive?not driving currently Denies vertigo Denies any chest pain, or shortness of breath.  Denies any fever or chills, or night sweats. No tobacco.  No new meds or hormonal supplements.  No sick contacts.  No new stressors present in personal life  Patient is compliant with his medications. Marland Kitchen    PREVIOUS MEDICATIONS:    CTA head & neck: Acute  and near-total occlusion of the left M1 segment 8 mm from the origin. Small amount of flow in the more distal MCA branches,presumably due to leptomeningeal collaterals. Atherosclerotic disease at both carotid bifurcations. 25% stenosis of the proximal ICA on the right. No measurable stenosis on the left. Atherosclerotic disease in both carotid siphon regions with serial stenoses estimated at 70% on the right and 50% on the left. 30% stenosis of the left vertebral artery origin.Both posterior cerebral arteries take fetal origin from the anterior circulation.6. Aortic atherosclerosis.   MRI: Mild restricted diffusion in the white matter tracts of the left basal ganglia and caudate, compatible with acute infarct Additional punctate acute infarct in the right perirolandic posterior frontal lobe. Small area of acute infarct versus artifact within the more anterior right frontal lobe white matter.   MRA: Left M1 MCA is now patent after thrombectomy. No emergent large vessel  occlusion. 2D echo EF 50-55% LDL 53 HgbA1c 8.2 VTE prophylaxis - Lovenox/Coumadin Off  heparin IV, started Coumadin with Lovenox bridge per pharmacy recommendations. INR goal 2.5-3.5   MRI of the brain May 2023 showed small to moderate left MCA branch infarct affecting left insular and frontal cortex. 2. Small chronic cerebellar infarcts. Mild chronic microvascular ischemia and cerebral volume loss for age.   CT angio of the head and neck no intracranial large vessel occlusion. Moderate to severe narrowing of the right cavernous carotid and moderate narrowing in the right supraclinoid carotid, left cavernous carotid, and distal left V4. 2.  No hemodynamically significant stenosis in the neck. 3. Diffuse osseous sclerosis, which is nonspecific but can be seen in the setting of renal osteodystrophy, Paget's disease, and diffuse metastatic disease, among other conditions.   Latest labs triglycerides 240, HDL 30 VLDL 48, A1c 8.3 2D Echo  EF > 75%, normal function of mechanical valve    Neuropsych evaluation 09/02/21 Briefly, results suggested neuropsychological functioning largely within normal limits relative to age-matched peers. He exhibited mild performance variability across executive functioning and semantic fluency. Specific to the latter, all other assessments of expressive language were strong. Performances were likewise appropriate across processing speed, attention/concentration, safety/judgment, receptive language, visuospatial abilities, and all aspects of learning and memory. Given Mr. Widener's handedness and the location of his May 2023 infarct (left MCA with frontal cortex involvement), some weaknesses in executive functioning and expressive language would be an expected finding. Current scores reflect an expected pattern of weakness and are likely directly related to this event. Relative to his wife's pre-stroke concerns surrounding memory decline, day-to-day dysfunction can likely be accounted for by significant and largely uncorrected hearing loss.    CURRENT MEDICATIONS:  Outpatient Encounter Medications as of 03/21/2022  Medication Sig   AMBULATORY NON FORMULARY MEDICATION cpap cushions            AirFit F20 (Size: Large)  AirFit F30 (Size: Med)   Please FAX the prescription to:  [phone]   amLODipine (NORVASC) 5 MG tablet TAKE 1 TABLET (5 MG TOTAL) BY MOUTH DAILY.   betamethasone dipropionate (DIPROLENE) 0.05 % cream Apply topically 2 (two) times daily as needed. To eczema rash   Blood Glucose Monitoring Suppl Supplies MISC Use for monitoring glucose level   clopidogrel (PLAVIX) 75 MG tablet TAKE 1 TABLET BY MOUTH EVERY DAY   colchicine 0.6 MG tablet TAKE 1 TABLET (0.6 MG TOTAL) BY MOUTH 2 (TWO) TIMES DAILY AS NEEDED.   Continuous Blood Gluc Sensor (DEXCOM G6 SENSOR) MISC 1 Device by Does not apply route as directed.   dapagliflozin propanediol (FARXIGA) 10 MG TABS tablet Take 1 tablet (10 mg  total) by mouth daily before breakfast.   fenofibrate (TRICOR) 145 MG tablet TAKE 1 TABLET BY MOUTH EVERY DAY   furosemide (LASIX) 20 MG tablet TAKE 1 TABLET BY MOUTH EVERY DAY AS NEEDED FOR SWELLING (Patient taking differently: Take 20 mg by mouth daily.)   glucose blood (ONETOUCH VERIO) test strip Use as instructed   insulin aspart (NOVOLOG FLEXPEN) 100 UNIT/ML FlexPen Max daily 75 units (Patient taking differently: Inject 14-19 Units into the skin See admin instructions. 16 units with breakfast, 14 units with lunch, and 16 units with supper, may increase up to 19 units per dose as needed for high blood sugar)   Insulin Glargine (BASAGLAR KWIKPEN) 100 UNIT/ML Inject 22 Units into the skin daily. (Patient taking differently: Inject 22 Units into the skin at bedtime.)   Insulin Pen Needle 32G X 4 MM MISC 1 Device by Does not apply route in the morning, at noon, in the evening, and at bedtime.   isosorbide mononitrate (IMDUR) 30 MG 24 hr tablet Take 30 mg by mouth daily.   metoprolol succinate (TOPROL-XL) 25 MG 24 hr tablet TAKE 1 TABLET BY MOUTH EVERY DAY   OneTouch Delica Lancets 99991111 MISC USE AS DIRECTED   rosuvastatin (CRESTOR) 40 MG tablet TAKE 1 TABLET BY MOUTH EVERY DAY   tacrolimus (PROTOPIC) 0.1 % ointment Apply 1 application  topically daily as needed (irritation).   tamsulosin (FLOMAX) 0.4 MG CAPS capsule Take 1 capsule (0.4 mg total) by mouth daily.   warfarin (COUMADIN) 7.5 MG tablet TAKE 1/2 A TABLET TO 1 TABLET BY MOUTH DAILY AS DIRECTED BY THE COUMADIN CLINIC (Patient taking differently: Take 3.75 mg Mon, Wed, and Fri Take 7.5 mg Sun, Tues, Thurs, and Sat)   No facility-administered encounter medications on file as of 03/21/2022.       01/19/2016    2:24 PM  MMSE - Mini Mental State Exam  Orientation to time 5  Orientation to Place 5  Registration 3  Attention/ Calculation 5  Recall 2  Language- name 2 objects 2  Language- repeat 1  Language- follow 3 step command 3   Language- read & follow direction 1  Write a sentence 1  Copy design 1  Total score 29      12/28/2020    8:00 AM  Montreal Cognitive Assessment   Visuospatial/ Executive (0/5) 5  Naming (0/3) 3  Attention: Read list of digits (0/2) 2  Attention: Read list of letters (0/1) 0  Attention: Serial 7 subtraction starting at 100 (0/3) 3  Language: Repeat phrase (0/2) 1  Language : Fluency (0/1) 1  Abstraction (0/2) 2  Delayed Recall (0/5) 5  Orientation (0/6) 6  Total 28    Objective:     PHYSICAL  EXAMINATION:    VITALS:   Vitals:   03/21/22 1058  BP: 104/64  Pulse: 82  Resp: 18  SpO2: 98%  Weight: 211 lb (95.7 kg)  Height: 5' 9"$  (1.753 m)    GEN:  The patient appears stated age and is in NAD. HEENT:  Normocephalic, atraumatic.   Neurological examination:  General: NAD, well-groomed, appears stated age. Orientation: The patient is alert. Oriented to person, place and date Cranial nerves: There is good facial symmetry.The speech is fluent and clear. No aphasia, very mild dysarthria, dysarthria. Fund of knowledge is appropriate. Recent and remote memory are normal. Attention and concentration are normal.  Able to name objects and repeat phrases.  Hearing is reduced to conversational tone.    Sensation: Sensation is intact to light touch throughout Motor: Strength is at least antigravity x4.. Minimal R sided weakness. Uses walker to ambulate DTR's 2/4 in UE/LE     Movement examination: Tone: There is normal tone in the UE/LE Abnormal movements:  R tremor hand when holding an object.  No myoclonus.  No asterixis.   Coordination:  There is no decremation with RAM's. Normal finger to nose  Gait and Station: The patient has no difficulty arising out of a deep-seated chair without the use of the hands. The patient's stride length is good.  Gait is cautious and narrow.    Thank you for allowing [name] the opportunity to participate in the care of this nice patient. Please do  not hesitate to contact us for any questions or concerns.   Total time spent on today's visit was 41 minutes dedicated to this patient today, preparing to see patient, examining the patient, ordering tests and/or medications and counseling the patient, documenting clinical information in the EHR or other health record, independently interpreting results and communicating results to the patient/family, discussing treatment and goals, answering patient's questions and coordinating care.  Cc:  [name], NP  Sharene Butters 03/21/2022 12:49 PM

## 2022-03-21 NOTE — Patient Instructions (Addendum)
It was a pleasure to see you today at our office.   Recommendations:  Follow up in 6  months Continue CPAP Continue coumadin and Plavix Follow up with Cardiology  Recommend Home PT and ST at home    Whom to call:  Memory  decline, memory medications: Call our office [phone]   For psychiatric meds, mood meds: Please have your primary care physician manage these medications.      For assessment of decision of mental capacity and competency:  Call Dr. Anthoney Harada, geriatric psychiatrist at [phone]  For guidance in geriatric dementia issues please call [facility] [phone]   If you have any severe symptoms of a stroke, or other severe issues such as confusion,severe chills or fever, etc call 911 or go to the ER as you may need to be evaluated further   Feel free to visit Facebook page " [employer]" for tips of how to care for people with memory problems.    Feel free to go to the following database for funded clinical studies conducted around the world: http://saunders.com/   https://www.triadclinicaltrials.com/     RECOMMENDATIONS FOR ALL PATIENTS WITH MEMORY PROBLEMS: 1. Continue to exercise (Recommend 30 minutes of walking everyday, or 3 hours every week) 2. Increase social interactions - continue going to [place] and enjoy social gatherings with friends and family 3. Eat healthy, avoid fried foods and eat more fruits and vegetables 4. Maintain adequate blood pressure, blood sugar, and blood cholesterol level. Reducing the risk of stroke and cardiovascular disease also helps promoting better memory. 5. Avoid stressful situations. Live a simple life and avoid aggravations. Organize your time and prepare for the next day in anticipation. 6. Sleep well, avoid any interruptions of sleep and avoid any distractions in the bedroom that may interfere with adequate sleep quality 7. Avoid sugar, avoid sweets as there is a strong link between excessive sugar  intake, diabetes, and cognitive impairment We discussed the Mediterranean diet, which has been shown to help patients reduce the risk of progressive memory disorders and reduces cardiovascular risk. This includes eating fish, eat fruits and green leafy vegetables, nuts like almonds and hazelnuts, walnuts, and also use olive oil. Avoid fast foods and fried foods as much as possible. Avoid sweets and sugar as sugar use has been linked to worsening of memory function.  There is always a concern of gradual progression of memory problems. If this is the case, then we may need to adjust level of care according to patient needs. Support, both to the patient and caregiver, should then be put into place.    FALL PRECAUTIONS: Be cautious when walking. Scan the area for obstacles that may increase the risk of trips and falls. When getting up in the mornings, sit up at the edge of the bed for a few minutes before getting out of bed. Consider elevating the bed at the head end to avoid drop of blood pressure when getting up. Walk always in a well-lit room (use night lights in the walls). Avoid area rugs or power cords from appliances in the middle of the walkways. Use a walker or a cane if necessary and consider physical therapy for balance exercise. Get your eyesight checked regularly.  FINANCIAL OVERSIGHT: Supervision, especially oversight when making financial decisions or transactions is also recommended.  HOME SAFETY: Consider the safety of the kitchen when operating appliances like stoves, microwave oven, and blender. Consider having supervision and share cooking responsibilities until no longer able to participate in  those. Accidents with firearms and other hazards in the house should be identified and addressed as well.   ABILITY TO BE LEFT ALONE: If patient is unable to contact 911 operator, consider using LifeLine, or when the need is there, arrange for someone to stay with patients. Smoking is a fire  hazard, consider supervision or cessation. Risk of wandering should be assessed by caregiver and if detected at any point, supervision and safe proof recommendations should be instituted.  MEDICATION SUPERVISION: Inability to self-administer medication needs to be constantly addressed. Implement a mechanism to ensure safe administration of the medications.   DRIVING: Regarding driving, in patients with progressive memory problems, driving will be impaired. We advise to have someone else do the driving if trouble finding directions or if minor accidents are reported. Independent driving assessment is available to determine safety of driving.   If you are interested in the driving assessment, you can contact the following:  [employer] in [place]  [place] [phone]  [place]  [facility] [phone] or [phone]

## 2022-03-23 ENCOUNTER — Ambulatory Visit: Payer: Medicare HMO | Attending: Cardiology

## 2022-03-23 DIAGNOSIS — Z7901 Long term (current) use of anticoagulants: Secondary | ICD-10-CM | POA: Diagnosis not present

## 2022-03-23 DIAGNOSIS — R413 Other amnesia: Secondary | ICD-10-CM | POA: Diagnosis not present

## 2022-03-23 DIAGNOSIS — R339 Retention of urine, unspecified: Secondary | ICD-10-CM | POA: Diagnosis not present

## 2022-03-23 DIAGNOSIS — Z7902 Long term (current) use of antithrombotics/antiplatelets: Secondary | ICD-10-CM | POA: Diagnosis not present

## 2022-03-23 DIAGNOSIS — I480 Paroxysmal atrial fibrillation: Secondary | ICD-10-CM | POA: Diagnosis not present

## 2022-03-23 DIAGNOSIS — I10 Essential (primary) hypertension: Secondary | ICD-10-CM | POA: Diagnosis not present

## 2022-03-23 DIAGNOSIS — I69322 Dysarthria following cerebral infarction: Secondary | ICD-10-CM | POA: Diagnosis not present

## 2022-03-23 DIAGNOSIS — I2510 Atherosclerotic heart disease of native coronary artery without angina pectoris: Secondary | ICD-10-CM | POA: Diagnosis not present

## 2022-03-23 DIAGNOSIS — N183 Chronic kidney disease, stage 3 unspecified: Secondary | ICD-10-CM | POA: Diagnosis not present

## 2022-03-23 DIAGNOSIS — R0683 Snoring: Secondary | ICD-10-CM | POA: Diagnosis not present

## 2022-03-23 DIAGNOSIS — E1165 Type 2 diabetes mellitus with hyperglycemia: Secondary | ICD-10-CM | POA: Diagnosis not present

## 2022-03-23 DIAGNOSIS — Z794 Long term (current) use of insulin: Secondary | ICD-10-CM | POA: Diagnosis not present

## 2022-03-23 DIAGNOSIS — I129 Hypertensive chronic kidney disease with stage 1 through stage 4 chronic kidney disease, or unspecified chronic kidney disease: Secondary | ICD-10-CM | POA: Diagnosis not present

## 2022-03-23 DIAGNOSIS — E1122 Type 2 diabetes mellitus with diabetic chronic kidney disease: Secondary | ICD-10-CM | POA: Diagnosis not present

## 2022-03-23 DIAGNOSIS — Z5181 Encounter for therapeutic drug level monitoring: Secondary | ICD-10-CM | POA: Diagnosis not present

## 2022-03-23 DIAGNOSIS — E785 Hyperlipidemia, unspecified: Secondary | ICD-10-CM | POA: Diagnosis not present

## 2022-03-23 DIAGNOSIS — I69351 Hemiplegia and hemiparesis following cerebral infarction affecting right dominant side: Secondary | ICD-10-CM | POA: Diagnosis not present

## 2022-03-23 DIAGNOSIS — N401 Enlarged prostate with lower urinary tract symptoms: Secondary | ICD-10-CM | POA: Diagnosis not present

## 2022-03-23 DIAGNOSIS — Z9989 Dependence on other enabling machines and devices: Secondary | ICD-10-CM | POA: Diagnosis not present

## 2022-03-23 DIAGNOSIS — I69320 Aphasia following cerebral infarction: Secondary | ICD-10-CM | POA: Diagnosis not present

## 2022-03-23 DIAGNOSIS — H9193 Unspecified hearing loss, bilateral: Secondary | ICD-10-CM | POA: Diagnosis not present

## 2022-03-23 DIAGNOSIS — Z951 Presence of aortocoronary bypass graft: Secondary | ICD-10-CM | POA: Diagnosis not present

## 2022-03-23 DIAGNOSIS — R001 Bradycardia, unspecified: Secondary | ICD-10-CM | POA: Diagnosis not present

## 2022-03-23 DIAGNOSIS — Z952 Presence of prosthetic heart valve: Secondary | ICD-10-CM | POA: Diagnosis not present

## 2022-03-23 DIAGNOSIS — G4733 Obstructive sleep apnea (adult) (pediatric): Secondary | ICD-10-CM | POA: Diagnosis not present

## 2022-03-23 DIAGNOSIS — Z8546 Personal history of malignant neoplasm of prostate: Secondary | ICD-10-CM | POA: Diagnosis not present

## 2022-03-23 DIAGNOSIS — Z7984 Long term (current) use of oral hypoglycemic drugs: Secondary | ICD-10-CM | POA: Diagnosis not present

## 2022-03-23 DIAGNOSIS — N182 Chronic kidney disease, stage 2 (mild): Secondary | ICD-10-CM | POA: Diagnosis not present

## 2022-03-23 DIAGNOSIS — R0681 Apnea, not elsewhere classified: Secondary | ICD-10-CM | POA: Diagnosis not present

## 2022-03-23 LAB — POCT INR: INR: 3.2 — AB (ref 2.0–3.0)

## 2022-03-23 NOTE — Patient Instructions (Signed)
Description   Continue taking warfarin 1 tablet daily except for 1/2 tablet on Mondays, Wednesdays, and Fridays. Continue consistency with leafy green intake.  Recheck INR in 4 weeks.  [facility]in Clinic [phone].

## 2022-03-24 ENCOUNTER — Telehealth: Payer: Self-pay

## 2022-03-24 ENCOUNTER — Ambulatory Visit: Payer: Self-pay

## 2022-03-24 DIAGNOSIS — G4733 Obstructive sleep apnea (adult) (pediatric): Secondary | ICD-10-CM | POA: Diagnosis not present

## 2022-03-24 DIAGNOSIS — I10 Essential (primary) hypertension: Secondary | ICD-10-CM | POA: Diagnosis not present

## 2022-03-24 NOTE — Patient Instructions (Signed)
Visit Information  Thank you for taking time to visit with me today. Please don't hesitate to contact me if I can be of assistance to you.   Following are the goals we discussed today:   Goals Addressed             This Visit's Progress    Health Management       Interventions Today    Flowsheet Row Most Recent Value  Chronic Disease   Chronic disease during today's visit Other  [CVA]  General Interventions   General Interventions Discussed/Reviewed General Interventions Reviewed, Doctor Visits  [encouraged to continue to take medications as prescribed, contact provider with any health questions or concerns. encouraged to make sure has has packed all medications and supplies to take with him to the beach.]  Doctor Visits Discussed/Reviewed Doctor Visits Reviewed, Specialist  PCP/Specialist Visits Compliance with follow-up visit  [patient following up with providers as schedule. reports he has urology procedure scheduled on 04/07/22]  Exercise Interventions   Exercise Discussed/Reviewed Physical Activity  Physical Activity Discussed/Reviewed Physical Activity Discussed  [Reports PT initiated care and will also have OT and speech that will start next week]  Education Interventions   Education Provided Provided Education  Provided Verbal Education On Other  [discussed indwelling foley catheter. reports urine clear today. states will have episode of blood urine and urolgoist aware. Scheduled for urology procedure on 04/07/22.]  Pharmacy Interventions   Pharmacy Dicussed/Reviewed Medication Adherence, Affording Medications           Our next appointment is by telephone on 04/21/22 at 10:30 am  Please call the care guide team at [phone] if you need to cancel or reschedule your appointment.   If you are experiencing a Mental Health or [place] or need someone to talk to, please call the Suicide and Crisis Lifeline: [place], RN, MSN, BSN, CCM Care  Management Coordinator [phone]   Thea Silversmith, RN, MSN, BSN, [place] Management Coordinator [phone]

## 2022-03-24 NOTE — Patient Outreach (Signed)
  Care Coordination   Follow Up Visit Note   03/24/2022 Name: [name] MRN: [id number] DOB: [birth year]  [name] is a 77 y.o. year old male who sees [name], NP for primary care. I spoke with  Daleen Squibb by phone today.  What matters to the patients health and wellness today?  [name] reports he is doing well. He denies any concerns at this time. He reports he has started with home health PT/OT and speech. And he states plans to go to the beach this week.    Goals Addressed             This Visit's Progress    Health Management       Interventions Today    Flowsheet Row Most Recent Value  Chronic Disease   Chronic disease during today's visit Other  [CVA]  General Interventions   General Interventions Discussed/Reviewed General Interventions Reviewed, Doctor Visits  [encouraged to continue to take medications as prescribed, contact provider with any health questions or concerns. encouraged to make sure has has packed all medications and supplies to take with him to the beach.]  Doctor Visits Discussed/Reviewed Doctor Visits Reviewed, Specialist  PCP/Specialist Visits Compliance with follow-up visit  [patient following up with providers as schedule. reports he has urology procedure scheduled on 04/07/22]  Exercise Interventions   Exercise Discussed/Reviewed Physical Activity  Physical Activity Discussed/Reviewed Physical Activity Discussed  [Reports PT initiated care and will also have OT and speech that will start next week]  Education Interventions   Education Provided Provided Education  Provided Verbal Education On Other  [discussed indwelling foley catheter. reports urine clear today. states will have episode of blood urine and urolgoist aware. Scheduled for urology procedure on 04/07/22.]  Pharmacy Interventions   Pharmacy Dicussed/Reviewed Medication Adherence, Affording Medications           SDOH assessments and interventions completed:   No  Care Coordination Interventions:  Yes, provided   Follow up plan: Follow up call scheduled for 04/21/22    Encounter Outcome:  Pt. Visit Completed   Thea Silversmith, RN, MSN, BSN, [place] Coordinator [phone]

## 2022-03-24 NOTE — Patient Outreach (Signed)
  Care Coordination   03/24/2022 Name: [name] MRN: [id number] DOB: 1945-06-22   Care Coordination Outreach Attempts:  An unsuccessful telephone outreach was attempted today to offer the patient information about available care coordination services as a benefit of their health plan. Per Mrs. Tietze, Patient is not in and plans to contact RNCM.  Follow Up Plan:  Additional outreach attempts will be made to offer the patient care coordination information and services.   Encounter Outcome:  No Answer   Care Coordination Interventions:  No, not indicated    Thea Silversmith, RN, MSN, BSN, [place] Coordinator [phone]

## 2022-03-27 DIAGNOSIS — D225 Melanocytic nevi of trunk: Secondary | ICD-10-CM | POA: Diagnosis not present

## 2022-03-27 DIAGNOSIS — L821 Other seborrheic keratosis: Secondary | ICD-10-CM | POA: Diagnosis not present

## 2022-03-27 DIAGNOSIS — L814 Other melanin hyperpigmentation: Secondary | ICD-10-CM | POA: Diagnosis not present

## 2022-03-27 DIAGNOSIS — Z859 Personal history of malignant neoplasm, unspecified: Secondary | ICD-10-CM | POA: Diagnosis not present

## 2022-03-27 DIAGNOSIS — L218 Other seborrheic dermatitis: Secondary | ICD-10-CM | POA: Diagnosis not present

## 2022-03-27 DIAGNOSIS — L570 Actinic keratosis: Secondary | ICD-10-CM | POA: Diagnosis not present

## 2022-03-29 DIAGNOSIS — G4733 Obstructive sleep apnea (adult) (pediatric): Secondary | ICD-10-CM | POA: Diagnosis not present

## 2022-03-29 DIAGNOSIS — I10 Essential (primary) hypertension: Secondary | ICD-10-CM | POA: Diagnosis not present

## 2022-03-31 ENCOUNTER — Telehealth: Payer: Self-pay

## 2022-03-31 NOTE — Telephone Encounter (Signed)
Home Health verbal orders  Agency Name: [facility]   Requesting Speech  Frequency: 3 times a week, very other week starting 04/10/22   Please forward to [facility] pool or providers CMA

## 2022-03-31 NOTE — Telephone Encounter (Signed)
Called [place] and provided her with verbal orders as requested.

## 2022-04-02 ENCOUNTER — Other Ambulatory Visit: Payer: Self-pay | Admitting: Cardiology

## 2022-04-02 DIAGNOSIS — I7121 Aneurysm of the ascending aorta, without rupture: Secondary | ICD-10-CM

## 2022-04-02 DIAGNOSIS — Z952 Presence of prosthetic heart valve: Secondary | ICD-10-CM

## 2022-04-02 DIAGNOSIS — I480 Paroxysmal atrial fibrillation: Secondary | ICD-10-CM

## 2022-04-03 NOTE — Telephone Encounter (Signed)
Warfarin 7.'5mg'$  refill History of aortic valve replacement  Last INR 03/23/22 Last OV 02/17/22

## 2022-04-05 DIAGNOSIS — G43809 Other migraine, not intractable, without status migrainosus: Secondary | ICD-10-CM | POA: Diagnosis not present

## 2022-04-05 DIAGNOSIS — H0102A Squamous blepharitis right eye, upper and lower eyelids: Secondary | ICD-10-CM | POA: Diagnosis not present

## 2022-04-05 DIAGNOSIS — H2513 Age-related nuclear cataract, bilateral: Secondary | ICD-10-CM | POA: Diagnosis not present

## 2022-04-05 DIAGNOSIS — E119 Type 2 diabetes mellitus without complications: Secondary | ICD-10-CM | POA: Diagnosis not present

## 2022-04-05 LAB — HM DIABETES EYE EXAM

## 2022-04-07 DIAGNOSIS — R338 Other retention of urine: Secondary | ICD-10-CM | POA: Diagnosis not present

## 2022-04-09 ENCOUNTER — Emergency Department (HOSPITAL_COMMUNITY): Payer: Medicare HMO

## 2022-04-09 ENCOUNTER — Other Ambulatory Visit: Payer: Self-pay

## 2022-04-09 ENCOUNTER — Encounter (HOSPITAL_COMMUNITY): Payer: Self-pay

## 2022-04-09 ENCOUNTER — Inpatient Hospital Stay (HOSPITAL_COMMUNITY)
Admission: EM | Admit: 2022-04-09 | Discharge: 2022-04-13 | DRG: 065 | Disposition: A | Discharge status: Home Health | Payer: Medicare HMO | Attending: Internal Medicine | Admitting: Internal Medicine

## 2022-04-09 DIAGNOSIS — R339 Retention of urine, unspecified: Secondary | ICD-10-CM | POA: Diagnosis not present

## 2022-04-09 DIAGNOSIS — I2510 Atherosclerotic heart disease of native coronary artery without angina pectoris: Secondary | ICD-10-CM | POA: Diagnosis present

## 2022-04-09 DIAGNOSIS — E1165 Type 2 diabetes mellitus with hyperglycemia: Secondary | ICD-10-CM | POA: Diagnosis present

## 2022-04-09 DIAGNOSIS — I639 Cerebral infarction, unspecified: Secondary | ICD-10-CM

## 2022-04-09 DIAGNOSIS — E1159 Type 2 diabetes mellitus with other circulatory complications: Secondary | ICD-10-CM | POA: Diagnosis not present

## 2022-04-09 DIAGNOSIS — R29700 NIHSS score 0: Secondary | ICD-10-CM | POA: Diagnosis present

## 2022-04-09 DIAGNOSIS — R29818 Other symptoms and signs involving the nervous system: Secondary | ICD-10-CM | POA: Diagnosis not present

## 2022-04-09 DIAGNOSIS — I63412 Cerebral infarction due to embolism of left middle cerebral artery: Secondary | ICD-10-CM | POA: Diagnosis not present

## 2022-04-09 DIAGNOSIS — F17290 Nicotine dependence, other tobacco product, uncomplicated: Secondary | ICD-10-CM | POA: Diagnosis present

## 2022-04-09 DIAGNOSIS — R251 Tremor, unspecified: Secondary | ICD-10-CM | POA: Diagnosis present

## 2022-04-09 DIAGNOSIS — M109 Gout, unspecified: Secondary | ICD-10-CM | POA: Diagnosis present

## 2022-04-09 DIAGNOSIS — I10 Essential (primary) hypertension: Secondary | ICD-10-CM | POA: Diagnosis present

## 2022-04-09 DIAGNOSIS — Z7902 Long term (current) use of antithrombotics/antiplatelets: Secondary | ICD-10-CM

## 2022-04-09 DIAGNOSIS — I421 Obstructive hypertrophic cardiomyopathy: Secondary | ICD-10-CM | POA: Diagnosis present

## 2022-04-09 DIAGNOSIS — R4701 Aphasia: Secondary | ICD-10-CM | POA: Diagnosis present

## 2022-04-09 DIAGNOSIS — Z8249 Family history of ischemic heart disease and other diseases of the circulatory system: Secondary | ICD-10-CM

## 2022-04-09 DIAGNOSIS — Z713 Dietary counseling and surveillance: Secondary | ICD-10-CM

## 2022-04-09 DIAGNOSIS — N1831 Chronic kidney disease, stage 3a: Secondary | ICD-10-CM

## 2022-04-09 DIAGNOSIS — Z7901 Long term (current) use of anticoagulants: Secondary | ICD-10-CM

## 2022-04-09 DIAGNOSIS — E1142 Type 2 diabetes mellitus with diabetic polyneuropathy: Secondary | ICD-10-CM | POA: Diagnosis present

## 2022-04-09 DIAGNOSIS — N182 Chronic kidney disease, stage 2 (mild): Secondary | ICD-10-CM | POA: Diagnosis present

## 2022-04-09 DIAGNOSIS — Z794 Long term (current) use of insulin: Secondary | ICD-10-CM

## 2022-04-09 DIAGNOSIS — Z952 Presence of prosthetic heart valve: Secondary | ICD-10-CM

## 2022-04-09 DIAGNOSIS — E876 Hypokalemia: Secondary | ICD-10-CM | POA: Diagnosis present

## 2022-04-09 DIAGNOSIS — I129 Hypertensive chronic kidney disease with stage 1 through stage 4 chronic kidney disease, or unspecified chronic kidney disease: Secondary | ICD-10-CM | POA: Diagnosis present

## 2022-04-09 DIAGNOSIS — Z716 Tobacco abuse counseling: Secondary | ICD-10-CM

## 2022-04-09 DIAGNOSIS — Z6831 Body mass index (BMI) 31.0-31.9, adult: Secondary | ICD-10-CM

## 2022-04-09 DIAGNOSIS — Z951 Presence of aortocoronary bypass graft: Secondary | ICD-10-CM

## 2022-04-09 DIAGNOSIS — R338 Other retention of urine: Secondary | ICD-10-CM | POA: Diagnosis present

## 2022-04-09 DIAGNOSIS — I480 Paroxysmal atrial fibrillation: Secondary | ICD-10-CM | POA: Diagnosis present

## 2022-04-09 DIAGNOSIS — H903 Sensorineural hearing loss, bilateral: Secondary | ICD-10-CM | POA: Diagnosis present

## 2022-04-09 DIAGNOSIS — Z743 Need for continuous supervision: Secondary | ICD-10-CM | POA: Diagnosis not present

## 2022-04-09 DIAGNOSIS — N401 Enlarged prostate with lower urinary tract symptoms: Secondary | ICD-10-CM | POA: Diagnosis present

## 2022-04-09 DIAGNOSIS — G4733 Obstructive sleep apnea (adult) (pediatric): Secondary | ICD-10-CM | POA: Diagnosis present

## 2022-04-09 DIAGNOSIS — E119 Type 2 diabetes mellitus without complications: Secondary | ICD-10-CM

## 2022-04-09 DIAGNOSIS — I712 Thoracic aortic aneurysm, without rupture, unspecified: Secondary | ICD-10-CM | POA: Diagnosis present

## 2022-04-09 DIAGNOSIS — D6869 Other thrombophilia: Secondary | ICD-10-CM | POA: Diagnosis present

## 2022-04-09 DIAGNOSIS — E781 Pure hyperglyceridemia: Secondary | ICD-10-CM | POA: Diagnosis present

## 2022-04-09 DIAGNOSIS — E669 Obesity, unspecified: Secondary | ICD-10-CM | POA: Diagnosis present

## 2022-04-09 DIAGNOSIS — I7121 Aneurysm of the ascending aorta, without rupture: Secondary | ICD-10-CM | POA: Diagnosis present

## 2022-04-09 DIAGNOSIS — E1122 Type 2 diabetes mellitus with diabetic chronic kidney disease: Secondary | ICD-10-CM | POA: Diagnosis present

## 2022-04-09 DIAGNOSIS — Z79899 Other long term (current) drug therapy: Secondary | ICD-10-CM

## 2022-04-09 DIAGNOSIS — Z8546 Personal history of malignant neoplasm of prostate: Secondary | ICD-10-CM

## 2022-04-09 DIAGNOSIS — Z8673 Personal history of transient ischemic attack (TIA), and cerebral infarction without residual deficits: Secondary | ICD-10-CM | POA: Diagnosis present

## 2022-04-09 HISTORY — DX: Cerebral infarction, unspecified: I63.9

## 2022-04-09 LAB — I-STAT CHEM 8, ED
BUN: 34 mg/dL — ABNORMAL HIGH (ref 8–23)
Calcium, Ion: 1.13 mmol/L — ABNORMAL LOW (ref 1.15–1.40)
Chloride: 102 mmol/L (ref 98–111)
Creatinine, Ser: 1.2 mg/dL (ref 0.61–1.24)
Glucose, Bld: 152 mg/dL — ABNORMAL HIGH (ref 70–99)
HCT: 40 % (ref 39.0–52.0)
Hemoglobin: 13.6 g/dL (ref 13.0–17.0)
Potassium: 3.4 mmol/L — ABNORMAL LOW (ref 3.5–5.1)
Sodium: 139 mmol/L (ref 135–145)
TCO2: 26 mmol/L (ref 22–32)

## 2022-04-09 LAB — CBC
HCT: 39.5 % (ref 39.0–52.0)
Hemoglobin: 13.3 g/dL (ref 13.0–17.0)
MCH: 29.4 pg (ref 26.0–34.0)
MCHC: 33.7 g/dL (ref 30.0–36.0)
MCV: 87.4 fL (ref 80.0–100.0)
Platelets: 255 10*3/uL (ref 150–400)
RBC: 4.52 MIL/uL (ref 4.22–5.81)
RDW: 14 % (ref 11.5–15.5)
WBC: 8.3 10*3/uL (ref 4.0–10.5)
nRBC: 0 % (ref 0.0–0.2)

## 2022-04-09 LAB — URINALYSIS, ROUTINE W REFLEX MICROSCOPIC
Bilirubin Urine: NEGATIVE
Glucose, UA: >=500 mg/dL — AB
Ketones, ur: NEGATIVE mg/dL
Nitrite: NEGATIVE
Protein, ur: 30 mg/dL — AB
Specific Gravity, Urine: 1.014 (ref 1.005–1.030)
WBC, UA: >50 WBC/hpf (ref 0–5)
pH: 6 (ref 5.0–8.0)

## 2022-04-09 LAB — APTT: aPTT: 31 seconds (ref 24–36)

## 2022-04-09 LAB — CBG MONITORING, ED: Glucose-Capillary: 119 mg/dL — ABNORMAL HIGH (ref 70–99)

## 2022-04-09 LAB — COMPREHENSIVE METABOLIC PANEL WITH GFR
ALT: 15 U/L (ref 0–44)
AST: 23 U/L (ref 15–41)
Albumin: 3.5 g/dL (ref 3.5–5.0)
Alkaline Phosphatase: 41 U/L (ref 38–126)
Anion gap: 13 (ref 5–15)
BUN: 33 mg/dL — ABNORMAL HIGH (ref 8–23)
CO2: 23 mmol/L (ref 22–32)
Calcium: 9 mg/dL (ref 8.9–10.3)
Chloride: 102 mmol/L (ref 98–111)
Creatinine, Ser: 1.27 mg/dL — ABNORMAL HIGH (ref 0.61–1.24)
GFR, Estimated: 58 mL/min — ABNORMAL LOW
Glucose, Bld: 152 mg/dL — ABNORMAL HIGH (ref 70–99)
Potassium: 3.3 mmol/L — ABNORMAL LOW (ref 3.5–5.1)
Sodium: 138 mmol/L (ref 135–145)
Total Bilirubin: 0.6 mg/dL (ref 0.3–1.2)
Total Protein: 6.8 g/dL (ref 6.5–8.1)

## 2022-04-09 LAB — RAPID URINE DRUG SCREEN, HOSP PERFORMED
Amphetamines: NOT DETECTED
Barbiturates: NOT DETECTED
Benzodiazepines: NOT DETECTED
Cocaine: NOT DETECTED
Opiates: NOT DETECTED
Tetrahydrocannabinol: NOT DETECTED

## 2022-04-09 LAB — DIFFERENTIAL
Abs Immature Granulocytes: 0.04 10*3/uL (ref 0.00–0.07)
Basophils Absolute: 0.1 10*3/uL (ref 0.0–0.1)
Basophils Relative: 1 %
Eosinophils Absolute: 0.2 10*3/uL (ref 0.0–0.5)
Eosinophils Relative: 2 %
Immature Granulocytes: 1 %
Lymphocytes Relative: 14 %
Lymphs Abs: 1.1 10*3/uL (ref 0.7–4.0)
Monocytes Absolute: 0.6 10*3/uL (ref 0.1–1.0)
Monocytes Relative: 8 %
Neutro Abs: 6.2 10*3/uL (ref 1.7–7.7)
Neutrophils Relative %: 74 %

## 2022-04-09 LAB — ETHANOL: Alcohol, Ethyl (B): <10 mg/dL (ref <10)

## 2022-04-09 LAB — PROTIME-INR
INR: 2.2 — ABNORMAL HIGH (ref 0.8–1.2)
Prothrombin Time: 24.6 s — ABNORMAL HIGH (ref 11.4–15.2)

## 2022-04-09 MED ORDER — SENNOSIDES-DOCUSATE SODIUM 8.6-50 MG PO TABS: 1.0000 | ORAL_TABLET | Freq: Every evening | ORAL | Status: DC | PRN

## 2022-04-09 MED ORDER — FUROSEMIDE 20 MG PO TABS
20.0000 mg | ORAL_TABLET | Freq: Every morning | ORAL | Status: DC
  Administered 2022-04-10 – 2022-04-13 (×4): 20 mg via ORAL
  Filled 2022-04-09 (×4): qty 1

## 2022-04-09 MED ORDER — TACROLIMUS 0.1 % EX OINT: 1.0000 | TOPICAL_OINTMENT | Freq: Two times a day (BID) | CUTANEOUS | Status: DC | PRN

## 2022-04-09 MED ORDER — CLOPIDOGREL BISULFATE 75 MG PO TABS
75.0000 mg | ORAL_TABLET | Freq: Every day | ORAL | Status: DC
  Administered 2022-04-10 – 2022-04-12 (×3): 75 mg via ORAL
  Filled 2022-04-09 (×3): qty 1

## 2022-04-09 MED ORDER — DAPAGLIFLOZIN PROPANEDIOL 10 MG PO TABS
10.0000 mg | ORAL_TABLET | Freq: Every day | ORAL | Status: DC
  Administered 2022-04-10 – 2022-04-13 (×4): 10 mg via ORAL
  Filled 2022-04-09 (×4): qty 1

## 2022-04-09 MED ORDER — TAMSULOSIN HCL 0.4 MG PO CAPS
0.4000 mg | ORAL_CAPSULE | Freq: Every day | ORAL | Status: DC
  Administered 2022-04-10 – 2022-04-13 (×4): 0.4 mg via ORAL
  Filled 2022-04-09 (×4): qty 1

## 2022-04-09 MED ORDER — WARFARIN - PHARMACIST DOSING INPATIENT: Freq: Every day | Status: DC

## 2022-04-09 MED ORDER — INSULIN GLARGINE-YFGN 100 UNIT/ML ~~LOC~~ SOLN
22.0000 [IU] | Freq: Every day | SUBCUTANEOUS | Status: DC
  Administered 2022-04-10 – 2022-04-12 (×3): 22 [IU] via SUBCUTANEOUS
  Filled 2022-04-09 (×5): qty 0.22

## 2022-04-09 MED ORDER — BASAGLAR KWIKPEN 100 UNIT/ML ~~LOC~~ SOPN: 22.0000 [IU] | PEN_INJECTOR | Freq: Every day | SUBCUTANEOUS | Status: DC

## 2022-04-09 MED ORDER — METOPROLOL SUCCINATE ER 25 MG PO TB24
25.0000 mg | ORAL_TABLET | Freq: Every day | ORAL | Status: DC
  Administered 2022-04-10 – 2022-04-13 (×4): 25 mg via ORAL
  Filled 2022-04-09 (×4): qty 1

## 2022-04-09 MED ORDER — HEPARIN (PORCINE) 25000 UT/250ML-% IV SOLN
1350.0000 [IU]/h | INTRAVENOUS | Status: DC
  Administered 2022-04-09: 1100 [IU]/h via INTRAVENOUS
  Administered 2022-04-10: 1300 [IU]/h via INTRAVENOUS
  Administered 2022-04-11 – 2022-04-13 (×3): 1350 [IU]/h via INTRAVENOUS
  Filled 2022-04-09 (×5): qty 250

## 2022-04-09 MED ORDER — ACETAMINOPHEN 325 MG PO TABS: 650.0000 mg | ORAL_TABLET | ORAL | Status: DC | PRN

## 2022-04-09 MED ORDER — FENOFIBRATE 160 MG PO TABS
160.0000 mg | ORAL_TABLET | Freq: Every day | ORAL | Status: DC
  Administered 2022-04-10 – 2022-04-13 (×4): 160 mg via ORAL
  Filled 2022-04-09 (×5): qty 1

## 2022-04-09 MED ORDER — WARFARIN SODIUM 7.5 MG PO TABS
7.5000 mg | ORAL_TABLET | Freq: Once | ORAL | Status: AC
  Administered 2022-04-09: 7.5 mg via ORAL
  Filled 2022-04-09: qty 1

## 2022-04-09 MED ORDER — INSULIN ASPART 100 UNIT/ML IJ SOLN
16.0000 [IU] | Freq: Every day | INTRAMUSCULAR | Status: DC
  Administered 2022-04-10 – 2022-04-13 (×4): 16 [IU] via SUBCUTANEOUS

## 2022-04-09 MED ORDER — INSULIN ASPART 100 UNIT/ML FLEXPEN: 14.0000 [IU] | PEN_INJECTOR | SUBCUTANEOUS | Status: DC

## 2022-04-09 MED ORDER — INSULIN ASPART 100 UNIT/ML IJ SOLN
16.0000 [IU] | Freq: Every day | INTRAMUSCULAR | Status: DC
  Administered 2022-04-10 – 2022-04-11 (×2): 16 [IU] via SUBCUTANEOUS

## 2022-04-09 MED ORDER — STROKE: EARLY STAGES OF RECOVERY BOOK: Freq: Once | Status: DC

## 2022-04-09 MED ORDER — ISOSORBIDE MONONITRATE ER 30 MG PO TB24
30.0000 mg | ORAL_TABLET | Freq: Every day | ORAL | Status: DC
  Administered 2022-04-10 – 2022-04-13 (×4): 30 mg via ORAL
  Filled 2022-04-09 (×4): qty 1

## 2022-04-09 MED ORDER — ACETAMINOPHEN 650 MG RE SUPP: 650.0000 mg | RECTAL | Status: DC | PRN

## 2022-04-09 MED ORDER — SODIUM CHLORIDE 0.9 % IV SOLN: INTRAVENOUS | Status: DC

## 2022-04-09 MED ORDER — TRIAMCINOLONE ACETONIDE 0.5 % EX CREA: TOPICAL_CREAM | Freq: Two times a day (BID) | CUTANEOUS | Status: DC | PRN

## 2022-04-09 MED ORDER — AMLODIPINE BESYLATE 5 MG PO TABS
5.0000 mg | ORAL_TABLET | Freq: Every morning | ORAL | Status: DC
  Administered 2022-04-10 – 2022-04-13 (×4): 5 mg via ORAL
  Filled 2022-04-09 (×4): qty 1

## 2022-04-09 MED ORDER — ROSUVASTATIN CALCIUM 20 MG PO TABS
40.0000 mg | ORAL_TABLET | Freq: Every day | ORAL | Status: DC
  Administered 2022-04-10 – 2022-04-12 (×4): 40 mg via ORAL
  Filled 2022-04-09 (×4): qty 2

## 2022-04-09 MED ORDER — ACETAMINOPHEN 160 MG/5ML PO SOLN: 650.0000 mg | ORAL | Status: DC | PRN

## 2022-04-09 MED ORDER — INSULIN ASPART 100 UNIT/ML IJ SOLN
14.0000 [IU] | Freq: Every day | INTRAMUSCULAR | Status: DC
  Administered 2022-04-10 – 2022-04-12 (×3): 14 [IU] via SUBCUTANEOUS

## 2022-04-09 MED ORDER — STROKE: EARLY STAGES OF RECOVERY BOOK
Freq: Once | Status: AC
  Filled 2022-04-09: qty 1

## 2022-04-09 NOTE — ED Notes (Signed)
Pt strait cathed per order under sterile technique 676m drained from bladder

## 2022-04-09 NOTE — Assessment & Plan Note (Signed)
Since his stroke in Dec '23 he had an indwelling foley catheter. He is followed by Dr. Diona Fanti. Last Friday foley removed and patient was to start doing I&O cath  Plan I&O cath q 4

## 2022-04-09 NOTE — ED Triage Notes (Signed)
Pt BIB EMS from home with c/o slurred speech and difficulty getting words out. Per wife, when waking up this morning she asked spouse question and he had difficulty answering and speech sounded slurred. Hx of stroke with no deficits. Pt back to baseline by the time EMS arrived on scene. GCS 15. Equal grip strength. No facial droop or slurred speech on arrival.

## 2022-04-09 NOTE — ED Notes (Signed)
Pt resting on stretcher NAD will continue to monitor wife at bedside

## 2022-04-09 NOTE — ED Notes (Signed)
Pt back from MRI resting on stretcher denies any complaints at this time wife at bedside will continue to monitor

## 2022-04-09 NOTE — ED Provider Notes (Signed)
7:12 AM Care assumed from Dr. Tyrone Nine.  At time of transfer of care, patient awaiting results of CT head and likely discussion with neurology for recurrent transient neurologic deficit of word finding/aphasia for about 1 hour this morning from 5 AM to 6 AM.  Patient has history of multiple strokes and INR was found to be 2.2 which is reportedly just under the 2.5 goal he has.  CT head showed no evidence of acute stroke but showed the old stroke.  I spoke to Dr. Cheral Marker with neurology who recommended MRI.  If MRI does not show acute stroke, patient is likely will for discharge home with instructions to try to better manage the INR.  If it does show stroke, he would like to be recalled and patient may need further management.   MRI did indeed show stroke.  Neurology was called and they will come see patient to provide a disposition the admission or discharge.  Care transferred to oncoming team to await neurology recommendations.   [name], Gwenyth Allegra, MD 04/09/22 [phone]

## 2022-04-09 NOTE — ED Notes (Signed)
Pt and wife updated on plan of care awaiting MRI NAD will continue monitor

## 2022-04-09 NOTE — Assessment & Plan Note (Signed)
Patient on coumadin. His INR has been subtherapeutic for mechanical AoV  Plan Pharmacy consult for coumadin mgt

## 2022-04-09 NOTE — H&P (Signed)
History and Physical    [name] [id number] DOB: 05-18-1945 DOA: 04/09/2022  DOS: the patient was seen and examined on 04/09/2022  PCP: David Alar, NP   Patient coming from: Home  I have personally briefly reviewed patient's old medical records in [facility]  [name], a 77 y/o with multiple co-morbidities, as listed  in problem list, had a stroke in Jan. He is s/p AoV replacement and is on coumadin but his INR has been subtherapeutic. He had an hour long episode of difficulty with word finding this AM. He presented to [facility] for evaluation of possible new CVA   ED Course: afebrile 179/72  HR 61  RR 18. Lb - K 3.3, glucose 152  Cr 1.27 CBCD nl, INR 2.2, U/A with blood and WBC (patient self-caths since Friday, CT head - negative, MRI with small embolic CVA near site of previous CVA. Neurology consulted - entered routine orders and wanted TRH to admit for usual work-up except deferring Echo to cardiology since he has a recent TEE. Pharmacy consulted to manage sub-therapeutic INR.  Review of Systems:  Review of Systems  Constitutional: Negative.   HENT:  Positive for hearing loss.   Eyes: Negative.   Respiratory: Negative.    Cardiovascular: Negative.   Gastrointestinal: Negative.   Genitourinary: Negative.   Musculoskeletal: Negative.   Skin: Negative.   Neurological:        Difficulty with word finding lasting an hour in the early AM  Psychiatric/Behavioral: Negative.      Past Medical History:  Diagnosis Date   Actinic keratosis 02/14/2011   Allergy    Aneurysm of ascending aorta without rupture ([place]) 01/06/2022   Aortic stenosis    Arthritis    Bilateral sensorineural hearing loss 06/16/2009   CAD (coronary artery disease)    cabg   Complication of anesthesia    Coronary atherosclerosis of native coronary artery 06/15/2009   Diabetic polyneuropathy 11/11/2018   Dyslipidemia [id number]   Embolic stroke involving left middle cerebral artery   05/30/2021   Erectile dysfunction 05/18/2009   Essential hypertension 06/16/2009   Expressive aphasia 06/25/2021   External hemorrhoids 03/21/2010   Gout 04/09/2015   Heart murmur    Hemochromatosis 02/14/2011   History of aortic valve replacement 10/13/2009   History of CABG 11/05/2018   2005- 1. Coronary artery bypass grafting x4 with LIMA-LAD, SVG-OM1, SVG-AM and dCFX.  2. Aortic valve replacement with St Jude AVR 3. Septal myomectomy.  4. Myoview low risk 2016   History of hepatitis 01/31/1972   Hypertriglyceridemia 06/13/2010   Hypertrophic obstructive cardiomyopathy 05/06/2008   SP septal myomectomy at surgery 2005 Echo Nov 2018- Hyperdynamic LVEF with severe basal septal hypertrophy. There is chordal SAM with resting gradient of 16 mmHg that increases to 85 mmHg with Valsalva        Leg weakness, bilateral 01/18/2021   Long term (current) use of anticoagulants 05/22/2018   Lumbar stenosis with neurogenic claudication 02/04/2021   Malignant neoplasm of prostate 06/17/2009   Mechanical heart valve present    Manufacturer: Sherlean Foot #: [id number]  Model #: [id number]. Card states MRI compatible with 3 teslas or less.   Obesity, unspecified 04/29/2008   OSA (obstructive sleep apnea) 07/12/2012   moderate-severe; uses CPAP nightly   PONV (postoperative nausea and vomiting)    after CABG- slow to wake up   Presence of prosthetic heart valve    Primary osteoarthritis of left shoulder 10/31/2018  S/P lumbar laminectomy 02/04/2021   Stage 3 chronic kidney disease due to type 2 diabetes mellitus 11/11/2018   Tobacco use 06/16/2009   Type 2 diabetes mellitus with hyperglycemia    Ventral hernia 03/03/2014    Past Surgical History:  Procedure Laterality Date   AORTIC VALVE REPLACEMENT  11/14/2003   St Jude [place]   CORONARY ANGIOGRAPHY  10/11/2021   CORONARY ARTERY BYPASS GRAFT  10/2003   HERNIA REPAIR  1999   right, inguinal    HERNIA REPAIR  2002   left, inguinal   HIP SURGERY  2006   right hip   IR CT HEAD LTD  02/03/2022   IR PERCUTANEOUS ART THROMBECTOMY/INFUSION INTRACRANIAL INC DIAG ANGIO  01/24/2022   LUMBAR LAMINECTOMY/DECOMPRESSION MICRODISCECTOMY Bilateral 02/04/2021   Procedure: Bilateral Lumbar Two-Three Laminectomy;  Surgeon: Kristeen Miss, MD;  Location: [place];  Service: Neurosurgery;  Laterality: Bilateral;  3C/RM 20   PILONIDAL CYST EXCISION  1964   prostate seed implant  03/2010   RADIOLOGY WITH ANESTHESIA N/A 01/24/2022   Procedure: IR WITH ANESTHESIA;  Surgeon: Radiologist, Medication, MD;  Location: [place];  Service: Radiology;  Laterality: N/A;   TEE WITHOUT CARDIOVERSION N/A 03/14/2022   Procedure: TRANSESOPHAGEAL ECHOCARDIOGRAM (TEE);  Surgeon: [name], MD;  Location: [place];  Service: Cardiology;  Laterality: N/A;   TONSILLECTOMY  childhood    Soc Hx - married, has a son at home who has Pharmacist, hospital, a daughter in [place] who is a Marketing executive. 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 Lives with his wife.   reports that he has been smoking cigars. He has never used smokeless tobacco. He reports that he does not currently use alcohol. He reports that he does not use drugs.  No Known Allergies  Family History  Problem Relation Age of Onset   Heart disease Mother    Stroke Mother    Heart disease Father    Asperger's syndrome Son    Hyperlipidemia Son    Coronary artery disease Brother    Cancer Neg Hx        negative for colon cancer    Prior to Admission medications   Medication Sig Start Date End Date Taking? Authorizing Provider  amLODipine (NORVASC) 5 MG tablet TAKE 1 TABLET (5 MG TOTAL) BY MOUTH DAILY. Patient taking differently: Take 5 mg by mouth in the morning. 12/02/21 05/31/22 Yes Lelon Perla, MD  betamethasone dipropionate (DIPROLENE) 0.05 % cream Apply topically 2 (two) times daily as needed. To eczema  rash Patient taking differently: Apply 1 application  topically 2 (two) times daily as needed (to eczema/rash). 09/06/18  Yes David Alar, NP  clopidogrel (PLAVIX) 75 MG tablet TAKE 1 TABLET BY MOUTH EVERY DAY Patient taking differently: Take 75 mg by mouth in the morning. 12/02/21  Yes Lelon Perla, MD  dapagliflozin propanediol (FARXIGA) 10 MG TABS tablet Take 1 tablet (10 mg total) by mouth daily before breakfast. 02/13/22  Yes Shamleffer, Melanie Crazier, MD  erythromycin ophthalmic ointment 1 Application See admin instructions. Apply to affected eye(s) as directed for styes   Yes [provider]  fenofibrate (TRICOR) 145 MG tablet TAKE 1 TABLET BY MOUTH EVERY DAY Patient taking differently: Take 145 mg by mouth in the morning. 11/08/21  Yes David Alar, NP  furosemide (LASIX) 20 MG tablet TAKE 1 TABLET BY MOUTH EVERY DAY AS NEEDED FOR  SWELLING Patient taking differently: Take 20 mg by mouth in the morning. 11/08/21  Yes David Alar, NP  insulin aspart (NOVOLOG FLEXPEN) 100 UNIT/ML FlexPen Max daily 75 units Patient taking differently: Inject 14-19 Units into the skin See admin instructions. Inject 16 units into the skin with breakfast, 14 units with lunch, and 16 units with supper/evening meal- may increase to up to 19 units per dose as needed for high blood sugar 02/13/22  Yes Shamleffer, Melanie Crazier, MD  Insulin Glargine (BASAGLAR KWIKPEN) 100 UNIT/ML Inject 22 Units into the skin daily. Patient taking differently: Inject 22 Units into the skin at bedtime. 02/13/22  Yes Shamleffer, Melanie Crazier, MD  isosorbide mononitrate (IMDUR) 30 MG 24 hr tablet Take 30 mg by mouth daily. 12/02/21  Yes [provider]  ketoconazole (NIZORAL) 2 % cream Apply 1 Application topically daily as needed for irritation.   Yes [provider]  metoprolol succinate (TOPROL-XL) 25 MG 24 hr tablet TAKE 1 TABLET BY MOUTH EVERY DAY Patient taking differently: Take  25 mg by mouth in the morning. 11/08/21  Yes David Alar, NP  PRESCRIPTION MEDICATION CPAP- At bedtime   Yes [provider]  rosuvastatin (CRESTOR) 40 MG tablet TAKE 1 TABLET BY MOUTH EVERY DAY Patient taking differently: Take 40 mg by mouth at bedtime. 11/30/21  Yes Lelon Perla, MD  tacrolimus (PROTOPIC) 0.1 % ointment Apply 1 application  topically 2 (two) times daily as needed (for eczema). 11/29/20  Yes [provider]  tamsulosin (FLOMAX) 0.4 MG CAPS capsule Take 1 capsule (0.4 mg total) by mouth daily. 01/28/22  Yes Lovey Newcomer C, NP  warfarin (COUMADIN) 7.5 MG tablet TAKE 1/2 A TABLET TO 1 TABLET BY MOUTH DAILY AS DIRECTED BY THE COUMADIN CLINIC Patient taking differently: Take 3.75-7.5 mg by mouth See admin instructions. Take 7.5 mg by mouth at bedtime on Sun/Tues/Thurs/Sat and 3.75 mg on Mon/Wed/Fri 04/03/22  Yes Crenshaw, Denice Bors, MD  AMBULATORY NON FORMULARY MEDICATION cpap cushions            AirFit F20 (Size: Large)           AirFit F30 (Size: Med)   Please FAX the prescription to:  [phone] 02/10/20   David Alar, NP  Blood Glucose Monitoring Suppl Supplies MISC Use for monitoring glucose level 08/05/18   David Alar, NP  colchicine 0.6 MG tablet TAKE 1 TABLET (0.6 MG TOTAL) BY MOUTH 2 (TWO) TIMES DAILY AS NEEDED. Patient not taking: Reported on 04/09/2022 11/10/21   David Alar, NP  Continuous Blood Gluc Sensor (DEXCOM G6 SENSOR) MISC 1 Device by Does not apply route as directed. 05/18/20   Shamleffer, Melanie Crazier, MD  glucose blood (ONETOUCH VERIO) test strip Use as instructed 09/15/20   David Alar, NP  Insulin Pen Needle 32G X 4 MM MISC 1 Device by Does not apply route in the morning, at noon, in the evening, and at bedtime. 02/13/22   Shamleffer, Melanie Crazier, MD  OneTouch Delica Lancets [id number] MISC USE AS DIRECTED 09/15/20   David Alar, NP    Physical Exam: Vitals:   04/09/22 1700 04/09/22 1850  04/09/22 1900 04/09/22 1930  BP: (!) 179/72  (!) 165/78 (!) 151/84  Pulse: 61  69 67  Resp: 18     Temp:  97.7 F (36.5 C)    TempSrc:  Oral    SpO2: 100%  96% 95%  Weight:      Height:        Physical Exam  Vitals and nursing note reviewed.  Constitutional:      General: He is not in acute distress.    Appearance: Normal appearance. He is obese. He is not ill-appearing.  HENT:     Head: Normocephalic and atraumatic.     Mouth/Throat:     Mouth: Mucous membranes are moist.     Pharynx: Oropharynx is clear.     Comments: Native dentition  Eyes:     Extraocular Movements: Extraocular movements intact.     Conjunctiva/sclera: Conjunctivae normal.     Pupils: Pupils are equal, round, and reactive to light.  Cardiovascular:     Rate and Rhythm: Normal rate and regular rhythm.     Pulses: Normal pulses.     Heart sounds: Murmur heard.     Comments: Soft early systolic mm best at RSB Pulmonary:     Effort: Pulmonary effort is normal.     Breath sounds: Normal breath sounds.  Abdominal:     General: Bowel sounds are normal.     Palpations: Abdomen is soft.  Musculoskeletal:        General: Normal range of motion.     Cervical back: Neck supple.     Right lower leg: No edema.     Left lower leg: No edema.  Skin:    General: Skin is warm and dry.  Neurological:     General: No focal deficit present.     Mental Status: He is alert and oriented to person, place, and time.     Cranial Nerves: No cranial nerve deficit.     Motor: Weakness present.  Psychiatric:        Mood and Affect: Mood normal.        Behavior: Behavior normal.      Labs on Admission: I have personally reviewed following labs and imaging studies  CBC: Recent Labs  Lab 04/09/22 0540 04/09/22 0630  WBC  --  8.3  NEUTROABS  --  6.2  HGB 13.6 13.3  HCT 40.0 39.5  MCV  --  87.4  PLT  --  123456   Basic Metabolic Panel: Recent Labs  Lab 04/09/22 0540 04/09/22 0630  NA 139 138  K 3.4* 3.3*  CL  102 102  CO2  --  23  GLUCOSE 152* 152*  BUN 34* 33*  CREATININE 1.20 1.27*  CALCIUM  --  9.0   GFR: Estimated Creatinine Clearance: 56.1 mL/min (A) (by C-G formula based on SCr of 1.27 mg/dL (H)). Liver Function Tests: Recent Labs  Lab 04/09/22 0630  AST 23  ALT 15  ALKPHOS 41  BILITOT 0.6  PROT 6.8  ALBUMIN 3.5   No results for input(s): "LIPASE", "AMYLASE" in the last 168 hours. No results for input(s): "AMMONIA" in the last 168 hours. Coagulation Profile: Recent Labs  Lab 04/09/22 0630  INR 2.2*   Cardiac Enzymes: No results for input(s): "CKTOTAL", "CKMB", "CKMBINDEX", "TROPONINI" in the last 168 hours. BNP (last 3 results) No results for input(s): "PROBNP" in the last 8760 hours. HbA1C: No results for input(s): "HGBA1C" in the last 72 hours. CBG: Recent Labs  Lab 04/09/22 1726  GLUCAP 119*   Lipid Profile: No results for input(s): "CHOL", "HDL", "LDLCALC", "TRIG", "CHOLHDL", "LDLDIRECT" in the last 72 hours. Thyroid Function Tests: No results for input(s): "TSH", "T4TOTAL", "FREET4", "T3FREE", "THYROIDAB" in the last 72 hours. Anemia Panel: No results for input(s): "VITAMINB12", "FOLATE", "FERRITIN", "TIBC", "IRON", "RETICCTPCT" in the last 72 hours. Urine analysis:    Component  Value Date/Time   COLORURINE YELLOW 04/09/2022 0753   APPEARANCEUR HAZY (A) 04/09/2022 0753   LABSPEC 1.014 04/09/2022 0753   PHURINE 6.0 04/09/2022 0753   GLUCOSEU >=500 (A) 04/09/2022 0753   GLUCOSEU NEGATIVE 01/08/2015 1032   HGBUR LARGE (A) 04/09/2022 0753   BILIRUBINUR NEGATIVE 04/09/2022 0753   BILIRUBINUR moderate 05/29/2013 1110   KETONESUR NEGATIVE 04/09/2022 0753   PROTEINUR 30 (A) 04/09/2022 0753   UROBILINOGEN 0.2 01/08/2015 1032   NITRITE NEGATIVE 04/09/2022 0753   LEUKOCYTESUR LARGE (A) 04/09/2022 0753    Radiological Exams on Admission: I have personally reviewed images MR BRAIN WO CONTRAST  Result Date: 04/09/2022 CLINICAL DATA:  77 year old male with  acute neurologic deficit primarily abnormal speech, now clinically resolved. Chronic left MCA and cerebellar infarcts. EXAM: MRI HEAD WITHOUT CONTRAST TECHNIQUE: Multiplanar, multiecho pulse sequences of the brain and surrounding structures were obtained without intravenous contrast. COMPARISON:  Head CT earlier today.  Brain MRI 01/25/2022. FINDINGS: Brain: Small area of left frontal operculum restricted diffusion superimposed on underlying chronic left middle MCA territory encephalomalacia. See series 5, image 86 and series 8, image 22. No significant cytotoxic edema. Adjacent encephalomalacia and laminar necrosis with mild if any hemosiderin. Intrinsic T1 hyperintensity has developed throughout the left lentiform, which appears stable on T2 and FLAIR. Questionable increased mineralization on SWI. No other restricted diffusion. Stable chronic bilateral cerebellar infarcts. Stable gray and white matter signal otherwise. Small chronic microhemorrhages in the periventricular white matter at the left frontal horn, right occipital horn are stable on SWI. No midline shift, mass effect, evidence of mass lesion, ventriculomegaly, extra-axial collection or acute intracranial hemorrhage. Cervicomedullary junction and pituitary are within normal limits. Vascular: Major intracranial vascular flow voids are stable since last year. Skull and upper cervical spine: Stable visible cervical spine with chronic ligamentous hypertrophy about the odontoid. Visualized bone marrow signal is within normal limits. Sinuses/Orbits: Stable and negative orbits. Stable mild paranasal sinus mucosal thickening. Other: Mastoids remain clear. Grossly normal visible internal auditory structures. IMPRESSION: 1. Positive for a tiny acute cortical infarct of the left frontal operculum, within an area of chronic Left MCA middle division infarct with encephalomalacia and laminar necrosis. No acute hemorrhage or mass effect. 2. Conspicuous increased  intrinsic T1 signal in the left lentiform nuclei since December. Nonspecific, but favor this is a form of Wallerian degeneration secondary to the chronic ischemia in #1. 3. Elsewhere stable noncontrast MRI appearance of the brain since last year., including chronic bilateral cerebellar infarcts and occasional punctate chronic microhemorrhages. Electronically Signed   By: Genevie Ann M.D.   On: 04/09/2022 08:58   CT HEAD WO CONTRAST  Result Date: 04/09/2022 CLINICAL DATA:  77 year old male with acute neurologic deficit. Left MCA middle division infarct last year. EXAM: CT HEAD WITHOUT CONTRAST TECHNIQUE: Contiguous axial images were obtained from the base of the skull through the vertex without intravenous contrast. RADIATION DOSE REDUCTION: This exam was performed according to the departmental dose-optimization program which includes automated exposure control, adjustment of the mA and/or kV according to patient size and/or use of iterative reconstruction technique. COMPARISON:  Brain MRI 06/25/2021, CT head and CTA 06/24/2021. FINDINGS: Brain: Encephalomalacia corresponding to previous left MCA middle division infarct. Stable cerebral volume. No midline shift, ventriculomegaly, mass effect, evidence of mass lesion, intracranial hemorrhage or evidence of cortically based acute infarction. Small chronic bilateral cerebellar infarcts are stable from the MRI last year. Vascular: Severe Calcified atherosclerosis at the skull base. No suspicious intracranial vascular hyperdensity. Skull: No acute  osseous abnormality identified. Partially visible chronic dental periapical lucency. Sinuses/Orbits: Generally mild paranasal sinus mucosal thickening is stable. Tympanic cavities and mastoids remain aerated. Other: Visualized orbits and scalp soft tissues are within normal limits. IMPRESSION: 1. No acute intracranial abnormality. 2. Chronic left MCA and cerebellar infarcts. Electronically Signed   By: Genevie Ann M.D.   On:  04/09/2022 07:26    EKG: I have personally reviewed EKG: SR, increased PR interval, old anteroseptal injury  Assessment/Plan Active Problems:   Acute CVA (cerebrovascular accident) ([place])   History of aortic valve replacement   Long term (current) use of anticoagulants   Diabetes mellitus ([place])   Essential hypertension   Urinary retention    Assessment and Plan: Acute CVA (cerebrovascular accident) [facility]) Patient presented for evaluation due to trouble with word finding this AM for about an hour. His eval revealed by MRI small CVA. His symptoms were resolved. Seen by Dr. Cheral Marker, neurology, who placed routine orders. He requested a cardiology consult to evaluation patient's prosthetic mechanical valve as possible source of emboli.  Plan  Routine stroke protocol except will defer to cardiology as to need for repeat echo.   Continue heparin infusion  Pharmacy consulting re: subtherapeutic INR  Diabetes mellitus ([facility]) Last A1C 8.2 end of December  Plan Continue home regimen  Diabetes education  Sliding scale coverage  Long term (current) use of anticoagulants Patient on coumadin. His INR has been subtherapeutic for mechanical AoV  Plan Pharmacy consult for coumadin mgt  History of aortic valve replacement Patient with mechanical valve. Follows with cardiology. Neurology concerned about valve deterioration as source of emboli  Plan Cardiology consult  Essential hypertension Will be permissive in setting of CVA  Plan Continue home regimen  Urinary retention Since his stroke in Dec '23 he had an indwelling foley catheter. He is followed by Dr. Diona Fanti. Last Friday foley removed and patient was to start doing I&O cath  Plan I&O cath q 4       DVT prophylaxis: IV heparin gtts Code Status: Full Code Family Communication: wife present during exam.answered all questions  Disposition Plan: home when stable  Consults called: neurology - Dr. Cheral Marker; Cardiology - [place]  Admission status: Observation, Telemetry bed   Adella Hare, MD [facility] 04/09/2022, 7:42 PM

## 2022-04-09 NOTE — Assessment & Plan Note (Addendum)
Patient with mechanical valve. Follows with cardiology. Neurology concerned about valve deterioration as source of emboli  Plan Cardiology consult

## 2022-04-09 NOTE — ED Notes (Signed)
Patient transported to CT 

## 2022-04-09 NOTE — ED Notes (Signed)
Pt called out for a selfcath. I asked EDMD they stated it was fine

## 2022-04-09 NOTE — Progress Notes (Signed)
ANTICOAGULATION CONSULT NOTE - Initial Consult  Pharmacy Consult for heparin and warfarin  Indication:  mechanical aortic valve with subtherapeutic INR  w acute stroke   No Known Allergies  Patient Measurements: Height: '5\' 9"'$  (175.3 cm) Weight: 97.5 kg (215 lb) IBW/kg (Calculated) : 70.7 Heparin Dosing Weight: 91.1kg   Vital Signs: Temp: 97.9 F (36.6 C) (03/10 1448) Temp Source: Oral (03/10 1448) BP: 179/72 (03/10 1700) Pulse Rate: 61 (03/10 1700)  Labs: Recent Labs    04/09/22 0540 04/09/22 0630  HGB 13.6 13.3  HCT 40.0 39.5  PLT  --  255  APTT  --  31  LABPROT  --  24.6*  INR  --  2.2*  CREATININE 1.20 1.27*    Estimated Creatinine Clearance: 56.1 mL/min (A) (by C-G formula based on SCr of 1.27 mg/dL (H)).   Medical History: Past Medical History:  Diagnosis Date   Actinic keratosis 02/14/2011   Allergy    Aneurysm of ascending aorta without rupture ([place]) 01/06/2022   Aortic stenosis    Arthritis    Bilateral sensorineural hearing loss 06/16/2009   CAD (coronary artery disease)    cabg   Complication of anesthesia    Coronary atherosclerosis of native coronary artery 06/15/2009   Diabetic polyneuropathy 11/11/2018   Dyslipidemia [id number]   Embolic stroke involving left middle cerebral artery  05/30/2021   Erectile dysfunction 05/18/2009   Essential hypertension 06/16/2009   Expressive aphasia 06/25/2021   External hemorrhoids 03/21/2010   Gout 04/09/2015   Heart murmur    Hemochromatosis 02/14/2011   History of aortic valve replacement 10/13/2009   History of CABG 11/05/2018   2005- 1. Coronary artery bypass grafting x4 with LIMA-LAD, SVG-OM1, SVG-AM and dCFX.  2. Aortic valve replacement with St Jude AVR 3. Septal myomectomy.  4. Myoview low risk 2016   History of hepatitis 01/31/1972   Hypertriglyceridemia 06/13/2010   Hypertrophic obstructive cardiomyopathy 05/06/2008   SP septal myomectomy at surgery 2005 Echo Nov 2018- Hyperdynamic LVEF with  severe basal septal hypertrophy. There is chordal SAM with resting gradient of 16 mmHg that increases to 85 mmHg with Valsalva        Leg weakness, bilateral 01/18/2021   Long term (current) use of anticoagulants 05/22/2018   Lumbar stenosis with neurogenic claudication 02/04/2021   Malignant neoplasm of prostate 06/17/2009   Mechanical heart valve present    Manufacturer: Sherlean Foot #: [id number]  Model #: [id number]. Card states MRI compatible with 3 teslas or less.   Obesity, unspecified 04/29/2008   OSA (obstructive sleep apnea) 07/12/2012   moderate-severe; uses CPAP nightly   PONV (postoperative nausea and vomiting)    after CABG- slow to wake up   Presence of prosthetic heart valve    Primary osteoarthritis of left shoulder 10/31/2018   S/P lumbar laminectomy 02/04/2021   Stage 3 chronic kidney disease due to type 2 diabetes mellitus 11/11/2018   Tobacco use 06/16/2009   Type 2 diabetes mellitus with hyperglycemia    Ventral hernia 03/03/2014   Assessment: Patient presenting with CC of stroke like symptoms. MRI showing small acute infarct in addition more chronic MCA infarct. Patient on warfarin prior to admission: 3.'75mg'$  MWF, 7.'5mg'$  all other days. Pharmacy consulted for heparin bridge with warfarin restart. Last dose of warfarin on 3/9 around 22:00 per patient.   Goal of Therapy:  INR: 2.5-3.5 Anti-Xa: 0.3-0.5  Monitor platelets by anticoagulation protocol: Yes   Plan:  INR subtherapeutic at 2.2.  No bolus per  MD.  Start heparin at 1100u/hr (12u/kg given recent stroke).  Will dose with home warfarin at 7.'5mg'$  x 1.  Check Anti-Xa in 8 hours.  Daily INR checks.   Ventura Sellers 04/09/2022,6:06 PM

## 2022-04-09 NOTE — Assessment & Plan Note (Signed)
Patient presented for evaluation due to trouble with word finding this AM for about an hour. His eval revealed by MRI small CVA. His symptoms were resolved. Seen by Dr. Cheral Marker, neurology, who placed routine orders. He requested a cardiology consult to evaluation patient's prosthetic mechanical valve as possible source of emboli.  Plan  Routine stroke protocol except will defer to cardiology as to need for repeat echo.   Continue heparin infusion  Pharmacy consulting re: subtherapeutic INR

## 2022-04-09 NOTE — ED Provider Notes (Signed)
Patient care signed out follow-up neurology recommendations which were to admit for further monitoring, testing and evaluation of recurrent strokes.  MRI results independently reviewed showing tiny acute stroke.  Hospitalist paged.  Golda Acre, MD 04/09/22 [phone]

## 2022-04-09 NOTE — ED Notes (Signed)
Urine sent to lab pt resting on stretcher with wife at bedside NAD awaiting neuro consult will continue to monitor

## 2022-04-09 NOTE — ED Notes (Signed)
Patient transported to MRI 

## 2022-04-09 NOTE — ED Notes (Signed)
ED TO INPATIENT HANDOFF REPORT  ED Nurse Name and Phone #: Waynetta Sandy [phone]  S Name/Age/Gender [name] 77 y.o. male Room/Bed: 020C/020C  Code Status   Code Status: Prior  Home/SNF/Other Home Patient oriented to: self, place, time, and situation Is this baseline? Yes   Triage Complete: Triage complete  Chief Complaint Stroke [place].  Triage Note Pt BIB EMS from home with c/o slurred speech and difficulty getting words out. Per wife, when waking up this morning she asked spouse question and he had difficulty answering and speech sounded slurred. Hx of stroke with no deficits. Pt back to baseline by the time EMS arrived on scene. GCS 15. Equal grip strength. No facial droop or slurred speech on arrival.   Allergies No Known Allergies  Level of Care/Admitting Diagnosis ED Disposition     ED Disposition  Admit   Condition  --   Comment  The patient appears reasonably stabilized for admission considering the current resources, flow, and capabilities available in the ED at this time, and I doubt any other [facility] requiring further screening and/or treatment in the ED prior to admission is  present.          B Medical/Surgery History Past Medical History:  Diagnosis Date   Actinic keratosis 02/14/2011   Allergy    Aneurysm of ascending aorta without rupture ([place]) 01/06/2022   Aortic stenosis    Arthritis    Bilateral sensorineural hearing loss 06/16/2009   CAD (coronary artery disease)    cabg   Complication of anesthesia    Coronary atherosclerosis of native coronary artery 06/15/2009   Diabetic polyneuropathy 11/11/2018   Dyslipidemia [id number]   Embolic stroke involving left middle cerebral artery  05/30/2021   Erectile dysfunction 05/18/2009   Essential hypertension 06/16/2009   Expressive aphasia 06/25/2021   External hemorrhoids 03/21/2010   Gout 04/09/2015   Heart murmur    Hemochromatosis 02/14/2011   History of aortic valve replacement  10/13/2009   History of CABG 11/05/2018   2005- 1. Coronary artery bypass grafting x4 with LIMA-LAD, SVG-OM1, SVG-AM and dCFX.  2. Aortic valve replacement with St Jude AVR 3. Septal myomectomy.  4. Myoview low risk 2016   History of hepatitis 01/31/1972   Hypertriglyceridemia 06/13/2010   Hypertrophic obstructive cardiomyopathy 05/06/2008   SP septal myomectomy at surgery 2005 Echo Nov 2018- Hyperdynamic LVEF with severe basal septal hypertrophy. There is chordal SAM with resting gradient of 16 mmHg that increases to 85 mmHg with Valsalva        Leg weakness, bilateral 01/18/2021   Long term (current) use of anticoagulants 05/22/2018   Lumbar stenosis with neurogenic claudication 02/04/2021   Malignant neoplasm of prostate 06/17/2009   Mechanical heart valve present    Manufacturer: Sherlean Foot #: [id number]  Model #: [id number]. Card states MRI compatible with 3 teslas or less.   Obesity, unspecified 04/29/2008   OSA (obstructive sleep apnea) 07/12/2012   moderate-severe; uses CPAP nightly   PONV (postoperative nausea and vomiting)    after CABG- slow to wake up   Presence of prosthetic heart valve    Primary osteoarthritis of left shoulder 10/31/2018   S/P lumbar laminectomy 02/04/2021   Stage 3 chronic kidney disease due to type 2 diabetes mellitus 11/11/2018   Tobacco use 06/16/2009   Type 2 diabetes mellitus with hyperglycemia    Ventral hernia 03/03/2014   Past Surgical History:  Procedure Laterality Date   AORTIC VALVE REPLACEMENT  11/14/2003  [place]   CORONARY ANGIOGRAPHY  10/11/2021   CORONARY ARTERY BYPASS GRAFT  10/2003   HERNIA REPAIR  1999   right, inguinal   HERNIA REPAIR  2002   left, inguinal   HIP SURGERY  2006   right hip   IR CT HEAD LTD  02/03/2022   IR PERCUTANEOUS ART THROMBECTOMY/INFUSION INTRACRANIAL INC DIAG ANGIO  01/24/2022   LUMBAR LAMINECTOMY/DECOMPRESSION MICRODISCECTOMY Bilateral  02/04/2021   Procedure: Bilateral Lumbar Two-Three Laminectomy;  Surgeon: Kristeen Miss, MD;  Location: [place];  Service: Neurosurgery;  Laterality: Bilateral;  3C/RM 20   PILONIDAL CYST EXCISION  1964   prostate seed implant  03/2010   RADIOLOGY WITH ANESTHESIA N/A 01/24/2022   Procedure: IR WITH ANESTHESIA;  Surgeon: Radiologist, Medication, MD;  Location: [place];  Service: Radiology;  Laterality: N/A;   TEE WITHOUT CARDIOVERSION N/A 03/14/2022   Procedure: TRANSESOPHAGEAL ECHOCARDIOGRAM (TEE);  Surgeon: [name], MD;  Location: [place];  Service: Cardiology;  Laterality: N/A;   TONSILLECTOMY  childhood     A IV Location/Drains/Wounds Patient Lines/Drains/Airways Status     Active Line/Drains/Airways     Name Placement date Placement time Site Days   Peripheral IV 04/09/22 20 G Left Antecubital 04/09/22  --  Antecubital  less than 1            Intake/Output Last 24 hours  Intake/Output Summary (Last 24 hours) at 04/09/2022 1719 Last data filed at 04/09/2022 1450 Gross per 24 hour  Intake --  Output 1400 ml  Net -1400 ml    Labs/Imaging Results for orders placed or performed during the hospital encounter of 04/09/22 (from the past 48 hour(s))  I-stat chem 8, ED     Status: Abnormal   Collection Time: 04/09/22  5:40 AM  Result Value Ref Range   Sodium 139 135 - 145 mmol/L   Potassium 3.4 (L) 3.5 - 5.1 mmol/L   Chloride 102 98 - 111 mmol/L   BUN 34 (H) 8 - 23 mg/dL   Creatinine, Ser 1.20 0.61 - 1.24 mg/dL   Glucose, Bld 152 (H) 70 - 99 mg/dL    Comment: Glucose reference range applies only to samples taken after fasting for at least 8 hours.   Calcium, Ion 1.13 (L) 1.15 - 1.40 mmol/L   TCO2 26 22 - 32 mmol/L   Hemoglobin 13.6 13.0 - 17.0 g/dL   HCT 40.0 39.0 - 52.0 %  Ethanol     Status: None   Collection Time: 04/09/22  6:30 AM  Result Value Ref Range   Alcohol, Ethyl (B) <10 <10 mg/dL    Comment: (NOTE) Lowest detectable limit for serum alcohol is  10 mg/dL.  For medical purposes only. Performed at [facility], [place] [address] [place], [place] [address]   Protime-INR     Status: Abnormal   Collection Time: 04/09/22  6:30 AM  Result Value Ref Range   Prothrombin Time 24.6 (H) 11.4 - 15.2 seconds   INR 2.2 (H) 0.8 - 1.2    Comment: (NOTE) INR goal varies based on device and disease states. Performed at [facility], [place] [address], [place], [place] [address]   APTT     Status: None   Collection Time: 04/09/22  6:30 AM  Result Value Ref Range   aPTT 31 24 - 36 seconds    Comment: Performed at [facility] [address]  [place], [place] [address]  CBC     Status: None   Collection Time: 04/09/22  6:30 AM  Result Value Ref Range   WBC 8.3 4.0 - 10.5 K/uL   RBC 4.52 4.22 - 5.81 MIL/uL   Hemoglobin 13.3 13.0 - 17.0 g/dL   HCT 39.5 39.0 - 52.0 %   MCV 87.4 80.0 - 100.0 fL   MCH 29.4 26.0 - 34.0 pg   MCHC 33.7 30.0 - 36.0 g/dL   RDW 14.0 11.5 - 15.5 %   Platelets 255 150 - 400 K/uL   nRBC 0.0 0.0 - 0.2 %    Comment: Performed at [facility], [place] [address] [place], [place] [address]  Differential     Status: None   Collection Time: 04/09/22  6:30 AM  Result Value Ref Range   Neutrophils Relative % 74 %   Neutro Abs 6.2 1.7 - 7.7 K/uL   Lymphocytes Relative 14 %   Lymphs Abs 1.1 0.7 - 4.0 K/uL   Monocytes Relative 8 %   Monocytes Absolute 0.6 0.1 - 1.0 K/uL   Eosinophils Relative 2 %   Eosinophils Absolute 0.2 0.0 - 0.5 K/uL   Basophils Relative 1 %   Basophils Absolute 0.1 0.0 - 0.1 K/uL   Immature Granulocytes 1 %   Abs Immature Granulocytes 0.04 0.00 - 0.07 K/uL    Comment: Performed at [facility] [address], [place], [place] [address]  Comprehensive metabolic panel     Status: Abnormal   Collection Time: 04/09/22  6:30 AM  Result Value Ref Range   Sodium 138 135 - 145 mmol/L   Potassium 3.3 (L) 3.5 - 5.1 mmol/L   Chloride 102 98 - 111 mmol/L   CO2 23 22 - 32 mmol/L    Glucose, Bld 152 (H) 70 - 99 mg/dL    Comment: Glucose reference range applies only to samples taken after fasting for at least 8 hours.   BUN 33 (H) 8 - 23 mg/dL   Creatinine, Ser 1.27 (H) 0.61 - 1.24 mg/dL   Calcium 9.0 8.9 - 10.3 mg/dL   Total Protein 6.8 6.5 - 8.1 g/dL   Albumin 3.5 3.5 - 5.0 g/dL   AST 23 15 - 41 U/L   ALT 15 0 - 44 U/L   Alkaline Phosphatase 41 38 - 126 U/L   Total Bilirubin 0.6 0.3 - 1.2 mg/dL   GFR, Estimated 58 (L) >60 mL/min    Comment: (NOTE) Calculated using the CKD-EPI Creatinine Equation (2021)    Anion gap 13 5 - 15    Comment: Performed at [facility] [address] [place],  [address]  Urine rapid drug screen (hosp performed)     Status: None   Collection Time: 04/09/22  7:53 AM  Result Value Ref Range   Opiates NONE DETECTED NONE DETECTED   Cocaine NONE DETECTED NONE DETECTED   Benzodiazepines NONE DETECTED NONE DETECTED   Amphetamines NONE DETECTED NONE DETECTED   Tetrahydrocannabinol NONE DETECTED NONE DETECTED   Barbiturates NONE DETECTED NONE DETECTED    Comment: (NOTE) DRUG SCREEN FOR MEDICAL PURPOSES ONLY.  IF CONFIRMATION IS NEEDED FOR ANY PURPOSE, NOTIFY LAB WITHIN 5 DAYS.  LOWEST DETECTABLE LIMITS FOR URINE DRUG SCREEN Drug Class                     Cutoff (ng/mL) Amphetamine and metabolites    1000 Barbiturate and metabolites    200 Benzodiazepine  200 Opiates and metabolites        300 Cocaine and metabolites        300 THC                            50 Performed at [facility], [place] [address], [place], [place] [address]   Urinalysis, Routine w reflex microscopic -Urine, Clean Catch     Status: Abnormal   Collection Time: 04/09/22  7:53 AM  Result Value Ref Range   Color, Urine YELLOW YELLOW   APPearance HAZY (A) CLEAR   Specific Gravity, Urine 1.014 1.005 - 1.030   pH 6.0 5.0 - 8.0   Glucose, UA >=500 (A) NEGATIVE mg/dL   Hgb urine dipstick LARGE (A) NEGATIVE   Bilirubin Urine  NEGATIVE NEGATIVE   Ketones, ur NEGATIVE NEGATIVE mg/dL   Protein, ur 30 (A) NEGATIVE mg/dL   Nitrite NEGATIVE NEGATIVE   Leukocytes,Ua LARGE (A) NEGATIVE   RBC / HPF 6-10 0 - 5 RBC/hpf   WBC, UA >50 0 - 5 WBC/hpf   Bacteria, UA RARE (A) NONE SEEN   Squamous Epithelial / HPF 0-5 0 - 5 /HPF   Mucus PRESENT     Comment: Performed at [facility], [address], [place], [place] [address]   *Note: Due to a large number of results and/or encounters for the requested time period, some results have not been displayed. A complete set of results can be found in Results Review.   MR BRAIN WO CONTRAST  Result Date: 04/09/2022 CLINICAL DATA:  77 year old male with acute neurologic deficit primarily abnormal speech, now clinically resolved. Chronic left MCA and cerebellar infarcts. EXAM: MRI HEAD WITHOUT CONTRAST TECHNIQUE: Multiplanar, multiecho pulse sequences of the brain and surrounding structures were obtained without intravenous contrast. COMPARISON:  Head CT earlier today.  Brain MRI 01/25/2022. FINDINGS: Brain: Small area of left frontal operculum restricted diffusion superimposed on underlying chronic left middle MCA territory encephalomalacia. See series 5, image 86 and series 8, image 22. No significant cytotoxic edema. Adjacent encephalomalacia and laminar necrosis with mild if any hemosiderin. Intrinsic T1 hyperintensity has developed throughout the left lentiform, which appears stable on T2 and FLAIR. Questionable increased mineralization on SWI. No other restricted diffusion. Stable chronic bilateral cerebellar infarcts. Stable gray and white matter signal otherwise. Small chronic microhemorrhages in the periventricular white matter at the left frontal horn, right occipital horn are stable on SWI. No midline shift, mass effect, evidence of mass lesion, ventriculomegaly, extra-axial collection or acute intracranial hemorrhage. Cervicomedullary junction and pituitary are within normal limits.  Vascular: Major intracranial vascular flow voids are stable since last year. Skull and upper cervical spine: Stable visible cervical spine with chronic ligamentous hypertrophy about the odontoid. Visualized bone marrow signal is within normal limits. Sinuses/Orbits: Stable and negative orbits. Stable mild paranasal sinus mucosal thickening. Other: Mastoids remain clear. Grossly normal visible internal auditory structures. IMPRESSION: 1. Positive for a tiny acute cortical infarct of the left frontal operculum, within an area of chronic Left MCA middle division infarct with encephalomalacia and laminar necrosis. No acute hemorrhage or mass effect. 2. Conspicuous increased intrinsic T1 signal in the left lentiform nuclei since December. Nonspecific, but favor this is a form of Wallerian degeneration secondary to the chronic ischemia in #1. 3. Elsewhere stable noncontrast MRI appearance of the brain since last year., including chronic bilateral cerebellar infarcts and occasional punctate chronic microhemorrhages. Electronically Signed   By:  Genevie Ann M.D.   On: 04/09/2022 08:58   CT HEAD WO CONTRAST  Result Date: 04/09/2022 CLINICAL DATA:  77 year old male with acute neurologic deficit. Left MCA middle division infarct last year. EXAM: CT HEAD WITHOUT CONTRAST TECHNIQUE: Contiguous axial images were obtained from the base of the skull through the vertex without intravenous contrast. RADIATION DOSE REDUCTION: This exam was performed according to the departmental dose-optimization program which includes automated exposure control, adjustment of the mA and/or kV according to patient size and/or use of iterative reconstruction technique. COMPARISON:  Brain MRI 06/25/2021, CT head and CTA 06/24/2021. FINDINGS: Brain: Encephalomalacia corresponding to previous left MCA middle division infarct. Stable cerebral volume. No midline shift, ventriculomegaly, mass effect, evidence of mass lesion, intracranial hemorrhage or  evidence of cortically based acute infarction. Small chronic bilateral cerebellar infarcts are stable from the MRI last year. Vascular: Severe Calcified atherosclerosis at the skull base. No suspicious intracranial vascular hyperdensity. Skull: No acute osseous abnormality identified. Partially visible chronic dental periapical lucency. Sinuses/Orbits: Generally mild paranasal sinus mucosal thickening is stable. Tympanic cavities and mastoids remain aerated. Other: Visualized orbits and scalp soft tissues are within normal limits. IMPRESSION: 1. No acute intracranial abnormality. 2. Chronic left MCA and cerebellar infarcts. Electronically Signed   By: Genevie Ann M.D.   On: 04/09/2022 07:26    Pending Labs Unresulted Labs (From admission, onward)     Start     Ordered   04/10/22 0500  Lipid panel  (Labs)  Tomorrow morning,   R       Comments: Fasting    04/09/22 1057            Vitals/Pain Today's Vitals   04/09/22 1448 04/09/22 1500 04/09/22 1515 04/09/22 1530  BP:  (!) 167/72    Pulse:  (!) 58 (!) 55 (!) 56  Resp:  '18 13 19  '$ Temp: 97.9 F (36.6 C)     TempSrc: Oral     SpO2:  98% 100% 99%  Weight:      Height:      PainSc:        Isolation Precautions No active isolations  Medications Medications   stroke: early stages of recovery book (has no administration in time range)    Mobility walks     Focused Assessments Neuro Assessment Handoff:  Swallow screen pass? Yes  Cardiac Rhythm: Normal sinus rhythm NIH Stroke Scale  Dizziness Present: No Headache Present: No Interval: Shift assessment Level of Consciousness (1a.)   : Alert, keenly responsive LOC Questions (1b. )   : Answers both questions correctly LOC Commands (1c. )   : Performs both tasks correctly Best Gaze (2. )  : Normal Visual (3. )  : No visual loss Facial Palsy (4. )    : Normal symmetrical movements Motor Arm, Left (5a. )   : No drift Motor Arm, Right (5b. ) : No drift Motor Leg, Left (6a. )  :  No drift Motor Leg, Right (6b. ) : No drift Limb Ataxia (7. ): Absent Sensory (8. )  : Normal, no sensory loss Best Language (9. )  : No aphasia Dysarthria (10. ): Normal Extinction/Inattention (11.)   : No Abnormality Complete NIHSS TOTAL: 0     Neuro Assessment: Within Defined Limits Neuro Checks:   Initial (04/09/22 0617)  Has TPA been given? No If patient is a Neuro Trauma and patient is going to OR before floor call report to [place] nurse: [phone] or [phone]  R Recommendations: See Admitting Provider Note  Report given to:   Additional Notes: Pt self caths approx every 4-5 hours

## 2022-04-09 NOTE — Subjective & Objective (Signed)
[name], a 77 y/o with multiple co-morbidities, as listed  in problem list, had a stroke in Jan. He is s/p AoV replacement and is on coumadin but his INR has been subtherapeutic. He had an hour long episode of difficulty with word finding this AM. He presented to [facility] for evaluation of possible new CVA

## 2022-04-09 NOTE — Consult Note (Signed)
Neurology Consultation  Reason for Consult: Acute stroke on MRI Referring Physician: Dr. Sherry Ruffing  CC: Aphasia  History is obtained from:patient, wife and medical record   HPI: [name] is a 77 y.o. male with past medical history of CAD S\P CABG, aortic valve replacement in 2011 on Coumadin, paroxysmal A-fib, hyperlipidemia, hypertension prior ischemic strokes with no current residual deficits, obstructive sleep apnea on CPAP, stage III chronic kidney disease, former tobacco smoker and DM with neuropathy who presents to [facility] via EMS for acute onset of trouble getting his words out.  Symptoms resolved by the time he arrived at [facility], and he currently feels as if he is back to baseline.  He denies any weakness, numbness, tingling, facial droop, headache or vision changes during this time.  CT head with no acute process.  MRI brain with tiny acute cortical infarct in left frontal operculum adjacent to a large, chronic left MCA infarction. He is on Plavix and Coumadin with most recent INR subtherapeutic at 2.2; his INR goal for his mechanical aortic valve is 2.5-3.5. Neurology has been consulted.  LKW: 0500 IV thrombolysis given?: no, symptoms resolved. On anticoagulation.  EVT:  No LVO Premorbid modified Rankin scale (mRS):  3-Moderate disability-requires help but walks WITHOUT assistance  ROS: Full ROS was performed and is negative except as noted in the HPI.    Past Medical History:  Diagnosis Date   Actinic keratosis 02/14/2011   Allergy    Aneurysm of ascending aorta without rupture ([place]) 01/06/2022   Aortic stenosis    Arthritis    Bilateral sensorineural hearing loss 06/16/2009   CAD (coronary artery disease)    cabg   Complication of anesthesia    Coronary atherosclerosis of native coronary artery 06/15/2009   Diabetic polyneuropathy 11/11/2018   Dyslipidemia [id number]   Embolic stroke involving left middle cerebral artery  05/30/2021   Erectile dysfunction  05/18/2009   Essential hypertension 06/16/2009   Expressive aphasia 06/25/2021   External hemorrhoids 03/21/2010   Gout 04/09/2015   Heart murmur    Hemochromatosis 02/14/2011   History of aortic valve replacement 10/13/2009   History of CABG 11/05/2018   2005- 1. Coronary artery bypass grafting x4 with LIMA-LAD, SVG-OM1, SVG-AM and dCFX.  2. Aortic valve replacement with [employer] AVR 3. Septal myomectomy.  4. Myoview low risk 2016   History of hepatitis 01/31/1972   Hypertriglyceridemia 06/13/2010   Hypertrophic obstructive cardiomyopathy 05/06/2008   SP septal myomectomy at surgery 2005 Echo Nov 2018- Hyperdynamic LVEF with severe basal septal hypertrophy. There is chordal SAM with resting gradient of 16 mmHg that increases to 85 mmHg with Valsalva        Leg weakness, bilateral 01/18/2021   Long term (current) use of anticoagulants 05/22/2018   Lumbar stenosis with neurogenic claudication 02/04/2021   Malignant neoplasm of prostate 06/17/2009   Mechanical heart valve present    Manufacturer: [employer]le[employer] #: [id number]  Model #: [id number]. Card states MRI compatible with 3 teslas or less.   Obesity, unspecified 04/29/2008   OSA (obstructive sleep apnea) 07/12/2012   moderate-severe; uses CPAP nightly   PONV (postoperative nausea and vomiting)    after CABG- slow to wake up   Presence of prosthetic heart valve    Primary osteoarthritis of left shoulder 10/31/2018   S/P lumbar laminectomy 02/04/2021   Stage 3 chronic kidney disease due to type 2 diabetes mellitus 11/11/2018   Tobacco use 06/16/2009   Type 2 diabetes mellitus  with hyperglycemia    Ventral hernia 03/03/2014     Family History  Problem Relation Age of Onset   Heart disease Mother    Stroke Mother    Heart disease Father    Asperger's syndrome Son    Hyperlipidemia Son    Coronary artery disease Brother    Cancer Neg Hx        negative for colon cancer     Social History:   reports that he has been  smoking cigars. He has never used smokeless tobacco. He reports that he does not currently use alcohol. He reports that he does not use drugs.  Medications No current facility-administered medications for this encounter.  Current Outpatient Medications:    AMBULATORY NON FORMULARY MEDICATION, cpap cushions            AirFit F20 (Size: Large)           AirFit F30 (Size: Med)   Please FAX the prescription to:  [phone], Disp: 12 each, Rfl: prn   amLODipine (NORVASC) 5 MG tablet, TAKE 1 TABLET (5 MG TOTAL) BY MOUTH DAILY., Disp: 90 tablet, Rfl: 1   betamethasone dipropionate (DIPROLENE) 0.05 % cream, Apply topically 2 (two) times daily as needed. To eczema rash, Disp: 30 g, Rfl: 1   Blood Glucose Monitoring Suppl Supplies MISC, Use for monitoring glucose level, Disp: 100 each, Rfl: 1   clopidogrel (PLAVIX) 75 MG tablet, TAKE 1 TABLET BY MOUTH EVERY DAY, Disp: 90 tablet, Rfl: 1   colchicine 0.6 MG tablet, TAKE 1 TABLET (0.6 MG TOTAL) BY MOUTH 2 (TWO) TIMES DAILY AS NEEDED., Disp: 180 tablet, Rfl: 0   Continuous Blood Gluc Sensor (DEXCOM G6 SENSOR) MISC, 1 Device by Does not apply route as directed., Disp: 9 each, Rfl: 3   dapagliflozin propanediol (FARXIGA) 10 MG TABS tablet, Take 1 tablet (10 mg total) by mouth daily before breakfast., Disp: 90 tablet, Rfl: 3   fenofibrate (TRICOR) 145 MG tablet, TAKE 1 TABLET BY MOUTH EVERY DAY, Disp: 90 tablet, Rfl: 1   furosemide (LASIX) 20 MG tablet, TAKE 1 TABLET BY MOUTH EVERY DAY AS NEEDED FOR SWELLING (Patient taking differently: Take 20 mg by mouth daily.), Disp: 90 tablet, Rfl: 1   glucose blood (ONETOUCH VERIO) test strip, Use as instructed, Disp: 100 strip, Rfl: 12   insulin aspart (NOVOLOG FLEXPEN) 100 UNIT/ML FlexPen, Max daily 75 units (Patient taking differently: Inject 14-19 Units into the skin See admin instructions. 16 units with breakfast, 14 units with lunch, and 16 units with supper, may increase up to 19 units per dose as needed for high  blood sugar), Disp: 75 mL, Rfl: 3   Insulin Glargine (BASAGLAR KWIKPEN) 100 UNIT/ML, Inject 22 Units into the skin daily. (Patient taking differently: Inject 22 Units into the skin at bedtime.), Disp: 30 mL, Rfl: 3   Insulin Pen Needle 32G X 4 MM MISC, 1 Device by Does not apply route in the morning, at noon, in the evening, and at bedtime., Disp: 400 each, Rfl: 3   isosorbide mononitrate (IMDUR) 30 MG 24 hr tablet, Take 30 mg by mouth daily., Disp: , Rfl:    metoprolol succinate (TOPROL-XL) 25 MG 24 hr tablet, TAKE 1 TABLET BY MOUTH EVERY DAY, Disp: 90 tablet, Rfl: 1   OneTouch Delica Lancets 99991111 MISC, USE AS DIRECTED, Disp: 100 each, Rfl: 1   rosuvastatin (CRESTOR) 40 MG tablet, TAKE 1 TABLET BY MOUTH EVERY DAY, Disp: 90 tablet, Rfl: 3   tacrolimus (PROTOPIC)  0.1 % ointment, Apply 1 application  topically daily as needed (irritation)., Disp: , Rfl:    tamsulosin (FLOMAX) 0.4 MG CAPS capsule, Take 1 capsule (0.4 mg total) by mouth daily., Disp: 30 capsule, Rfl: 0   warfarin (COUMADIN) 7.5 MG tablet, TAKE 1/2 A TABLET TO 1 TABLET BY MOUTH DAILY AS DIRECTED BY THE COUMADIN CLINIC, Disp: 105 tablet, Rfl: 1   Exam: Current vital signs: BP (!) 146/64   Pulse (!) 54   Temp 98 F (36.7 C) (Oral)   Resp 16   Ht '5\' 9"'$  (1.753 m)   Wt 97.5 kg   SpO2 100%   BMI 31.75 kg/m  Vital signs in last 24 hours: Temp:  [98 F (36.7 C)] 98 F (36.7 C) (03/10 0610) Pulse Rate:  [54-65] 54 (03/10 0745) Resp:  [13-19] 16 (03/10 0745) BP: (141-184)/(64-72) 146/64 (03/10 0730) SpO2:  [98 %-100 %] 100 % (03/10 0745) Weight:  [97.5 kg] 97.5 kg (03/10 0614)  GENERAL: Awake, alert in NAD HEENT: - Normocephalic and atraumatic, dry mm LUNGS - Clear to auscultation bilaterally with no wheezes CV - S1S2 RRR, no m/r/g, equal pulses bilaterally. ABDOMEN - Soft, nontender, nondistended with normoactive BS Ext: warm, well perfused, intact peripheral pulses, no edema  NEURO:  Mental Status: AA&Ox4 Language:  speech is clear.  Naming, repetition, fluency, and comprehension intact. Cranial Nerves: PERRL EOMI, visual fields full, no facial asymmetry, facial sensation intact, hearing intact, tongue/uvula/soft palate midline, normal sternocleidomastoid and trapezius muscle strength. No evidence of tongue atrophy or fasciculations Motor: 5/5 in all 4 extremities Tone: is normal and bulk is normal Sensation- Intact to light touch bilaterally Coordination: FTN intact bilaterally, no ataxia in BLE. Gait- Deferred  NIHSS 1a Level of Conscious.: 0 1b LOC Questions: 0 1c LOC Commands: 0 2 Best Gaze: 0 3 Visual: 0 4 Facial Palsy: 0 5a Motor Arm - left: 0 5b Motor Arm - Right: 0 6a Motor Leg - Left: 0 6b Motor Leg - Right: 0 7 Limb Ataxia: 0 8 Sensory: 0 9 Best Language: 0 10 Dysarthria: 0 11 Extinct. and Inatten.: 0 TOTAL: 0   Labs I have reviewed labs in epic and the results pertinent to this consultation are:  CBC    Component Value Date/Time   WBC 8.3 04/09/2022 0630   RBC 4.52 04/09/2022 0630   HGB 13.3 04/09/2022 0630   HGB 13.4 03/07/2022 1031   HGB 14.1 03/30/2016 1507   HCT 39.5 04/09/2022 0630   HCT 41.2 03/07/2022 1031   HCT 40.0 03/30/2016 1507   PLT 255 04/09/2022 0630   PLT 337 03/07/2022 1031   MCV 87.4 04/09/2022 0630   MCV 88 03/07/2022 1031   MCV 89 03/30/2016 1507   MCH 29.4 04/09/2022 0630   MCHC 33.7 04/09/2022 0630   RDW 14.0 04/09/2022 0630   RDW 13.2 03/07/2022 1031   RDW 12.9 03/30/2016 1507   LYMPHSABS 1.1 04/09/2022 0630   LYMPHSABS 2.0 03/30/2016 1507   MONOABS 0.6 04/09/2022 0630   EOSABS 0.2 04/09/2022 0630   EOSABS 0.3 03/30/2016 1507   BASOSABS 0.1 04/09/2022 0630   BASOSABS 0.1 03/30/2016 1507    CMP     Component Value Date/Time   NA 138 04/09/2022 0630   NA 137 03/07/2022 1031   NA 138 09/23/2015 1020   K 3.3 (L) 04/09/2022 0630   K 4.3 03/30/2016 1507   K 4.4 09/23/2015 1020   CL 102 04/09/2022 0630   CL 99 03/30/2016 1507  CO2  23 04/09/2022 0630   CO2 25 03/30/2016 1507   CO2 25 09/23/2015 1020   GLUCOSE 152 (H) 04/09/2022 0630   GLUCOSE 128 09/23/2015 1020   BUN 33 (H) 04/09/2022 0630   BUN 28 (H) 03/07/2022 1031   BUN 28.4 (H) 09/23/2015 1020   CREATININE 1.27 (H) 04/09/2022 0630   CREATININE 1.63 (H) 09/22/2019 1119   CREATININE 1.3 09/23/2015 1020   CALCIUM 9.0 04/09/2022 0630   CALCIUM 10.1 03/30/2016 1507   CALCIUM 9.5 09/23/2015 1020   PROT 6.8 04/09/2022 0630   PROT 7.2 03/24/2021 0815   PROT 7.4 09/23/2015 1020   ALBUMIN 3.5 04/09/2022 0630   ALBUMIN 4.2 03/24/2021 0815   ALBUMIN 4.8 03/30/2016 1507   ALBUMIN 3.8 09/23/2015 1020   AST 23 04/09/2022 0630   AST 25 04/10/2019 1300   AST 41 (H) 09/23/2015 1020   ALT 15 04/09/2022 0630   ALT 20 04/10/2019 1300   ALT 29 09/23/2015 1020   ALKPHOS 41 04/09/2022 0630   ALKPHOS 45 03/30/2016 1507   ALKPHOS 46 09/23/2015 1020   BILITOT 0.6 04/09/2022 0630   BILITOT 0.3 03/24/2021 0815   BILITOT 0.5 04/10/2019 1300   BILITOT 0.46 09/23/2015 1020   GFRNONAA 58 (L) 04/09/2022 0630   GFRNONAA 39 (L) 04/10/2019 1300   GFRNONAA 63 05/31/2012 0818   GFRAA 45 (L) 04/10/2019 1300   GFRAA 73 05/31/2012 0818    Lipid Panel     Component Value Date/Time   CHOL 119 01/25/2022 0458   CHOL 162 03/24/2021 0815   TRIG 604 (H) 01/25/2022 0458   HDL 25 (L) 01/25/2022 0458   HDL 29 (L) 03/24/2021 0815   CHOLHDL 4.8 01/25/2022 0458   VLDL UNABLE TO CALCULATE IF TRIGLYCERIDE OVER 400 mg/dL 01/25/2022 0458   LDLCALC UNABLE TO CALCULATE IF TRIGLYCERIDE OVER 400 mg/dL 01/25/2022 0458   LDLCALC 77 03/24/2021 0815   LDLCALC 99 09/22/2019 1119   LDLDIRECT 53 01/26/2022 0305    maging I have reviewed the images obtained:  CT-head no acute process.  Chronic left MCA and cerebellar infarcts  MRI examination of the brain-tiny acute cortical infarct in the left frontal operculum  TEE on 03/14/2022 EF 55 to 60%, moderate asymmetric left ventricular hypertrophy  of the basal septal segment, left atrium LA dilated no appendage thrombus, right atrium mildly dilated  Assessment: 77 y.o. male with past medical history of CAD S\P CABG, aortic valve replacement on Coumadin, paroxysmal A-fib, hyperlipidemia, hypertension prior CVA with no deficits obstructive sleep apnea on CPAP stage III chronic kidney disease, former tobacco smoker, DM with neuropathy who presents with recurrent stroke. - Exam is nonfocal with NIHSS 0 - Recurrent stroke on MRI is tiny, but based on his initial symptoms is likely residual damage in a significantly larger area of initial ischemia. Mechanism was most likely cardioembolic in the setting of his mechanical aortic valve with subtherapeutic INR of 2.2  - Given several recent strokes in the last 2 years in the setting of his mechanical aortic valve, will need Cardiology to see during this admission to determine if there is possible mechanical aortic valve deterioration/structural change (mechanical valve thrombosis, pannus growth) not visible on most recent TEE that may be contributing to the recent occurrence of several strokes within the last 2 years after having been stroke-free for the first 11 years following his valve replacement in 2011. Also will need tighter monitoring of his Coumadin dosing by increasing the frequency of his Coumadin clinic visits  from qmonth to at least every 2 weeks. A visiting nurse may be helpful for this or alternatively at-home INR monitoring can be considered.   Impression: Small acute cortical infarct in the left frontal operculum likely cardioembolic   Recommendations:  - HgbA1c, fasting lipid panel - Frequent neuro checks - Cardiology has been consulted to determine if patient needs repeat echocardiogram.  - CTA head and neck - Continue Plavix '75mg'$  daily for his CAD and Coumadin for his atrial fibrillation and mechanical heart valve with INR goal of 2.5-3.5. Pharmacy assistance recommended for achieving  INR goal.  - Bridging with heparin is indicated. Will call Pharmacy for assistance with starting a no-bolus heparin infusion. Discussed with Cardiology.  - Risk factor modification - Telemetry monitoring - PT consult, OT consult, Speech consult - BP management per standard protocol. The acute infarct is too small to have an appreciable penumbra. Risks of permissive HTN are felt to outweigh the benefits given his advanced age and multiple medical comorbidities.  - Stroke team to follow  Beulah Gandy DNP, ACNPC-AG  [facility]  I have seen and examined the patient. I have formulated the assessment and recommendations. 77 year old male presenting with stroke recurrence in the setting of subtherapeutic INR and mechanical aortic valve. Exam is nonfocal. Recommendations as above.  Electronically signed: Dr. Kerney Elbe

## 2022-04-09 NOTE — Assessment & Plan Note (Signed)
Last A1C 8.2 end of December  Plan Continue home regimen  Diabetes education  Sliding scale coverage

## 2022-04-09 NOTE — Assessment & Plan Note (Signed)
Will be permissive in setting of CVA  Plan Continue home regimen

## 2022-04-09 NOTE — ED Provider Notes (Signed)
[facility] Provider Note   CSN: [id number] Arrival date & time: 04/09/22  [id number]     History  Chief Complaint  Patient presents with   Aphasia    [name] is a 77 y.o. male.  77 yo M with a chief complaints of difficulty with word finding.  This lasted for maybe an hour.  He feels like it is completely resolved.  He denies one-sided numbness or weakness denies difficulty with swallowing.  He felt like his head felt funny as well.  Patient has had recent stroke about 3 months ago.  At that time he had right-sided weakness which she seems to have recovered well.  He had some trouble with word finding back last May and was told that he had a mini stroke.        Home Medications Prior to Admission medications   Medication Sig Start Date End Date Taking? Authorizing Provider  AMBULATORY NON FORMULARY MEDICATION cpap cushions            AirFit F20 (Size: Large)           AirFit F30 (Size: Med)   Please FAX the prescription to:  [phone] 02/10/20   [name], NP  amLODipine (NORVASC) 5 MG tablet TAKE 1 TABLET (5 MG TOTAL) BY MOUTH DAILY. 12/02/21 05/31/22  Lelon Perla, MD  betamethasone dipropionate (DIPROLENE) 0.05 % cream Apply topically 2 (two) times daily as needed. To eczema rash 09/06/18   [name], NP  Blood Glucose Monitoring Suppl Supplies MISC Use for monitoring glucose level 08/05/18   [name], NP  clopidogrel (PLAVIX) 75 MG tablet TAKE 1 TABLET BY MOUTH EVERY DAY 12/02/21   Lelon Perla, MD  colchicine 0.6 MG tablet TAKE 1 TABLET (0.6 MG TOTAL) BY MOUTH 2 (TWO) TIMES DAILY AS NEEDED. 11/10/21   [name], NP  Continuous Blood Gluc Sensor (DEXCOM G6 SENSOR) MISC 1 Device by Does not apply route as directed. 05/18/20   Shamleffer, Melanie Crazier, MD  dapagliflozin propanediol (FARXIGA) 10 MG TABS tablet Take 1 tablet (10 mg total) by mouth daily before breakfast. 02/13/22    Shamleffer, Melanie Crazier, MD  fenofibrate (TRICOR) 145 MG tablet TAKE 1 TABLET BY MOUTH EVERY DAY 11/08/21   [name], NP  furosemide (LASIX) 20 MG tablet TAKE 1 TABLET BY MOUTH EVERY DAY AS NEEDED FOR SWELLING Patient taking differently: Take 20 mg by mouth daily. 11/08/21   [name], NP  glucose blood (ONETOUCH VERIO) test strip Use as instructed 09/15/20   [name], NP  insulin aspart (NOVOLOG FLEXPEN) 100 UNIT/ML FlexPen Max daily 75 units Patient taking differently: Inject 14-19 Units into the skin See admin instructions. 16 units with breakfast, 14 units with lunch, and 16 units with supper, may increase up to 19 units per dose as needed for high blood sugar 02/13/22   Shamleffer, Melanie Crazier, MD  Insulin Glargine (BASAGLAR KWIKPEN) 100 UNIT/ML Inject 22 Units into the skin daily. Patient taking differently: Inject 22 Units into the skin at bedtime. 02/13/22   Shamleffer, Melanie Crazier, MD  Insulin Pen Needle 32G X 4 MM MISC 1 Device by Does not apply route in the morning, at noon, in the evening, and at bedtime. 02/13/22   Shamleffer, Melanie Crazier, MD  isosorbide mononitrate (IMDUR) 30 MG 24 hr tablet Take 30 mg by mouth daily. 12/02/21   [provider]  metoprolol succinate (TOPROL-XL) 25 MG 24 hr tablet TAKE 1  TABLET BY MOUTH EVERY DAY 11/08/21   [name], NP  OneTouch Delica Lancets [id number] MISC USE AS DIRECTED 09/15/20   [name], NP  rosuvastatin (CRESTOR) 40 MG tablet TAKE 1 TABLET BY MOUTH EVERY DAY 11/30/21   Lelon Perla, MD  tacrolimus (PROTOPIC) 0.1 % ointment Apply 1 application  topically daily as needed (irritation). 11/29/20   [provider]  tamsulosin (FLOMAX) 0.4 MG CAPS capsule Take 1 capsule (0.4 mg total) by mouth daily. 01/28/22   Otelia Santee, NP  warfarin (COUMADIN) 7.5 MG tablet TAKE 1/2 A TABLET TO 1 TABLET BY MOUTH DAILY AS DIRECTED BY THE COUMADIN CLINIC 04/03/22   Lelon Perla, MD       Allergies    Patient has no known allergies.    Review of Systems   Review of Systems  Physical Exam Updated Vital Signs BP (!) 184/72 (BP Location: Right Arm)   Pulse 65   Temp 98 F (36.7 C) (Oral)   Resp 16   Ht '5\' 9"'$  (1.753 m)   Wt 97.5 kg   SpO2 98%   BMI 31.75 kg/m  Physical Exam Vitals and nursing note reviewed.  Constitutional:      Appearance: He is well-developed.  HENT:     Head: Normocephalic and atraumatic.  Eyes:     Pupils: Pupils are equal, round, and reactive to light.  Neck:     Vascular: No JVD.  Cardiovascular:     Rate and Rhythm: Normal rate and regular rhythm.     Heart sounds: No murmur heard.    No friction rub. No gallop.  Pulmonary:     Effort: No respiratory distress.     Breath sounds: No wheezing.  Abdominal:     General: There is no distension.     Tenderness: There is no abdominal tenderness. There is no guarding or rebound.  Musculoskeletal:        General: Normal range of motion.     Cervical back: Normal range of motion and neck supple.  Skin:    Coloration: Skin is not pale.     Findings: No rash.  Neurological:     Mental Status: He is alert and oriented to person, place, and time.     Cranial Nerves: Cranial nerves 2-12 are intact.     Sensory: Sensation is intact.     Motor: Motor function is intact.     Coordination: Coordination is intact.     Comments: Patient appears to be hard of hearing otherwise has a benign neurologic exam.  Psychiatric:        Behavior: Behavior normal.     ED Results / Procedures / Treatments   Labs (all labs ordered are listed, but only abnormal results are displayed) Labs Reviewed  PROTIME-INR - Abnormal; Notable for the following components:      Result Value   Prothrombin Time 24.6 (*)    INR 2.2 (*)    All other components within normal limits  COMPREHENSIVE METABOLIC PANEL - Abnormal; Notable for the following components:   Potassium 3.3 (*)    Glucose, Bld 152 (*)     BUN 33 (*)    Creatinine, Ser 1.27 (*)    GFR, Estimated 58 (*)    All other components within normal limits  I-STAT CHEM 8, ED - Abnormal; Notable for the following components:   Potassium 3.4 (*)    BUN 34 (*)    Glucose, Bld 152 (*)  Calcium, Ion 1.13 (*)    All other components within normal limits  APTT  CBC  DIFFERENTIAL  ETHANOL  RAPID URINE DRUG SCREEN, HOSP PERFORMED  URINALYSIS, ROUTINE W REFLEX MICROSCOPIC    EKG EKG Interpretation  Date/Time:  Sunday April 09 2022 06:11:42 EDT Ventricular Rate:  62 PR Interval:  216 QRS Duration: 104 QT Interval:  402 QTC Calculation: 405 R Axis:   74 Text Interpretation: Sinus rhythm Borderline prolonged PR interval Anteroseptal infarct, age indeterminate Baseline wander TECHNICALLY DIFFICULT Otherwise no significant change Confirmed by Deno Etienne [phone]) on 04/09/2022 6:21:01 AM  Radiology No results found.  Procedures Procedures    Medications Ordered in ED Medications - No data to display  ED Course/ Medical Decision Making/ A&P                             Medical Decision Making Amount and/or Complexity of Data Reviewed Labs: ordered. Radiology: ordered.   77 yo M with a chief complaints of difficulty with word finding.  This lasted for about an hour.  He feels like it completely resolved now.  Will obtain a CT of the head blood work.  Will discuss with neurology.  On my record review the patient appears to have had 2 admissions for stroke in the past year.  He is on Coumadin with a mechanical heart valve.  On his initial visit they thought his INR target should have been higher, on his second he was found to be subtherapeutic.  INR technically subtherapeutic today.  Awaiting CT.  Then plans to speak with neurology.  Patient care was signed out to Dr.Tegeler, please see their note for further details of care in the ED.  The patients results and plan were reviewed and discussed.   Any x-rays performed were  independently reviewed by myself.   Differential diagnosis were considered with the presenting HPI.  Medications - No data to display  Vitals:   04/09/22 0610 04/09/22 0614  BP: (!) 184/72   Pulse: 65   Resp: 16   Temp: 98 F (36.7 C)   TempSrc: Oral   SpO2: 98%   Weight:  97.5 kg  Height:  '5\' 9"'$  (1.753 m)    Final diagnoses:  Aphasia           Final Clinical Impression(s) / ED Diagnoses Final diagnoses:  Aphasia    Rx / DC Orders ED Discharge Orders     None         Deno Etienne, DO 04/09/22 [id number]

## 2022-04-09 NOTE — ED Notes (Signed)
Neuro at bedside.

## 2022-04-10 ENCOUNTER — Ambulatory Visit (HOSPITAL_COMMUNITY): Payer: Medicare HMO

## 2022-04-10 ENCOUNTER — Observation Stay (HOSPITAL_COMMUNITY): Payer: Medicare HMO

## 2022-04-10 DIAGNOSIS — N182 Chronic kidney disease, stage 2 (mild): Secondary | ICD-10-CM | POA: Diagnosis not present

## 2022-04-10 DIAGNOSIS — I421 Obstructive hypertrophic cardiomyopathy: Secondary | ICD-10-CM | POA: Diagnosis not present

## 2022-04-10 DIAGNOSIS — I6389 Other cerebral infarction: Secondary | ICD-10-CM

## 2022-04-10 DIAGNOSIS — R29700 NIHSS score 0: Secondary | ICD-10-CM | POA: Diagnosis not present

## 2022-04-10 DIAGNOSIS — F17290 Nicotine dependence, other tobacco product, uncomplicated: Secondary | ICD-10-CM | POA: Diagnosis not present

## 2022-04-10 DIAGNOSIS — Z952 Presence of prosthetic heart valve: Secondary | ICD-10-CM | POA: Diagnosis not present

## 2022-04-10 DIAGNOSIS — Z6831 Body mass index (BMI) 31.0-31.9, adult: Secondary | ICD-10-CM | POA: Diagnosis not present

## 2022-04-10 DIAGNOSIS — I712 Thoracic aortic aneurysm, without rupture, unspecified: Secondary | ICD-10-CM

## 2022-04-10 DIAGNOSIS — E876 Hypokalemia: Secondary | ICD-10-CM | POA: Diagnosis not present

## 2022-04-10 DIAGNOSIS — R251 Tremor, unspecified: Secondary | ICD-10-CM | POA: Diagnosis not present

## 2022-04-10 DIAGNOSIS — R4701 Aphasia: Secondary | ICD-10-CM

## 2022-04-10 DIAGNOSIS — G4733 Obstructive sleep apnea (adult) (pediatric): Secondary | ICD-10-CM | POA: Diagnosis not present

## 2022-04-10 DIAGNOSIS — R339 Retention of urine, unspecified: Secondary | ICD-10-CM | POA: Diagnosis not present

## 2022-04-10 DIAGNOSIS — Z794 Long term (current) use of insulin: Secondary | ICD-10-CM | POA: Diagnosis not present

## 2022-04-10 DIAGNOSIS — H903 Sensorineural hearing loss, bilateral: Secondary | ICD-10-CM | POA: Diagnosis not present

## 2022-04-10 DIAGNOSIS — I639 Cerebral infarction, unspecified: Secondary | ICD-10-CM | POA: Diagnosis not present

## 2022-04-10 DIAGNOSIS — E669 Obesity, unspecified: Secondary | ICD-10-CM | POA: Diagnosis not present

## 2022-04-10 DIAGNOSIS — I2510 Atherosclerotic heart disease of native coronary artery without angina pectoris: Secondary | ICD-10-CM | POA: Diagnosis not present

## 2022-04-10 DIAGNOSIS — I480 Paroxysmal atrial fibrillation: Secondary | ICD-10-CM | POA: Diagnosis not present

## 2022-04-10 DIAGNOSIS — E1165 Type 2 diabetes mellitus with hyperglycemia: Secondary | ICD-10-CM | POA: Diagnosis not present

## 2022-04-10 DIAGNOSIS — E1159 Type 2 diabetes mellitus with other circulatory complications: Secondary | ICD-10-CM | POA: Diagnosis not present

## 2022-04-10 DIAGNOSIS — E1122 Type 2 diabetes mellitus with diabetic chronic kidney disease: Secondary | ICD-10-CM | POA: Diagnosis not present

## 2022-04-10 DIAGNOSIS — N401 Enlarged prostate with lower urinary tract symptoms: Secondary | ICD-10-CM | POA: Diagnosis not present

## 2022-04-10 DIAGNOSIS — I129 Hypertensive chronic kidney disease with stage 1 through stage 4 chronic kidney disease, or unspecified chronic kidney disease: Secondary | ICD-10-CM | POA: Diagnosis not present

## 2022-04-10 DIAGNOSIS — D6869 Other thrombophilia: Secondary | ICD-10-CM | POA: Diagnosis not present

## 2022-04-10 DIAGNOSIS — M109 Gout, unspecified: Secondary | ICD-10-CM | POA: Diagnosis not present

## 2022-04-10 DIAGNOSIS — I63412 Cerebral infarction due to embolism of left middle cerebral artery: Secondary | ICD-10-CM | POA: Diagnosis not present

## 2022-04-10 DIAGNOSIS — I10 Essential (primary) hypertension: Secondary | ICD-10-CM | POA: Diagnosis not present

## 2022-04-10 DIAGNOSIS — I7121 Aneurysm of the ascending aorta, without rupture: Secondary | ICD-10-CM | POA: Diagnosis not present

## 2022-04-10 DIAGNOSIS — E1142 Type 2 diabetes mellitus with diabetic polyneuropathy: Secondary | ICD-10-CM | POA: Diagnosis not present

## 2022-04-10 LAB — CBC
HCT: 40.4 % (ref 39.0–52.0)
Hemoglobin: 13.7 g/dL (ref 13.0–17.0)
MCH: 29.3 pg (ref 26.0–34.0)
MCHC: 33.9 g/dL (ref 30.0–36.0)
MCV: 86.3 fL (ref 80.0–100.0)
Platelets: 279 10*3/uL (ref 150–400)
RBC: 4.68 MIL/uL (ref 4.22–5.81)
RDW: 14 % (ref 11.5–15.5)
WBC: 9.7 10*3/uL (ref 4.0–10.5)
nRBC: 0 % (ref 0.0–0.2)

## 2022-04-10 LAB — LIPID PANEL
Cholesterol: 136 mg/dL (ref 0–200)
HDL: 30 mg/dL — ABNORMAL LOW (ref >40)
LDL Cholesterol: 56 mg/dL (ref 0–99)
Total CHOL/HDL Ratio: 4.5 RATIO
Triglycerides: 252 mg/dL — ABNORMAL HIGH (ref <150)
VLDL: 50 mg/dL — ABNORMAL HIGH (ref 0–40)

## 2022-04-10 LAB — CBG MONITORING, ED: Glucose-Capillary: 161 mg/dL — ABNORMAL HIGH (ref 70–99)

## 2022-04-10 LAB — GLUCOSE, CAPILLARY
Glucose-Capillary: 111 mg/dL — ABNORMAL HIGH (ref 70–99)
Glucose-Capillary: 122 mg/dL — ABNORMAL HIGH (ref 70–99)
Glucose-Capillary: 151 mg/dL — ABNORMAL HIGH (ref 70–99)

## 2022-04-10 LAB — PROTIME-INR
INR: 2.2 — ABNORMAL HIGH (ref 0.8–1.2)
Prothrombin Time: 24 seconds — ABNORMAL HIGH (ref 11.4–15.2)

## 2022-04-10 LAB — ECHOCARDIOGRAM LIMITED
Height: 69 in
Weight: 3440 oz

## 2022-04-10 LAB — HEPARIN LEVEL (UNFRACTIONATED)
Heparin Unfractionated: <0.1 IU/mL — ABNORMAL LOW (ref 0.30–0.70)
Heparin Unfractionated: >1.1 IU/mL — ABNORMAL HIGH (ref 0.30–0.70)
Heparin Unfractionated: 0.3 IU/mL (ref 0.30–0.70)

## 2022-04-10 LAB — HEMOGLOBIN A1C
Hgb A1c MFr Bld: 7.3 % — ABNORMAL HIGH (ref 4.8–5.6)
Mean Plasma Glucose: 163 mg/dL

## 2022-04-10 MED ORDER — POTASSIUM CHLORIDE CRYS ER 20 MEQ PO TBCR
40.0000 meq | EXTENDED_RELEASE_TABLET | Freq: Once | ORAL | Status: AC
  Administered 2022-04-10: 40 meq via ORAL
  Filled 2022-04-10: qty 2

## 2022-04-10 MED ORDER — WARFARIN SODIUM 5 MG PO TABS
7.5000 mg | ORAL_TABLET | Freq: Once | ORAL | Status: AC
  Administered 2022-04-10: 7.5 mg via ORAL
  Filled 2022-04-10 (×2): qty 1

## 2022-04-10 MED ORDER — PERFLUTREN LIPID MICROSPHERE
1.0000 mL | INTRAVENOUS | Status: AC | PRN
  Administered 2022-04-10: 2 mL via INTRAVENOUS

## 2022-04-10 NOTE — Evaluation (Signed)
Speech Language Pathology Evaluation Patient Details Name: [name] MRN: [id number] DOB: [birth year] Today's Date: 04/10/2022 Time: [id number] SLP Time Calculation (min) (ACUTE ONLY): 25 min  Problem List:  Patient Active Problem List   Diagnosis Date Noted   H/O: CVA (cerebrovascular accident) 03/14/2022   Urinary retention 01/26/2022   Acute ischemic stroke ([place]) 01/24/2022   Middle cerebral artery embolism, left 01/24/2022   Aneurysm of ascending aorta without rupture ([place]) 01/06/2022   Type 2 diabetes mellitus with stage 3a chronic kidney disease, with long-term current use of insulin ([place]) 10/30/2021   Diabetes mellitus ([place]) 10/30/2021   Type 2 diabetes mellitus with diabetic polyneuropathy, with long-term current use of insulin ([place]) 10/30/2021   NSTEMI (non-ST elevated myocardial infarction) ([place]) 10/09/2021   Paroxysmal atrial fibrillation ([place]) 10/09/2021   Presence of prosthetic heart valve    Type 2 diabetes mellitus with hyperglycemia    Expressive aphasia 06/25/2021   Acute CVA (cerebrovascular accident) ([place]) 05/2021   S/P lumbar laminectomy 02/04/2021   Lumbar stenosis with neurogenic claudication 02/04/2021   History of falling 01/30/2021   Diabetic polyneuropathy 11/11/2018   Stage 3 chronic kidney disease due to type 2 diabetes mellitus 11/11/2018   History of CABG 11/05/2018   Dyslipidemia 11/05/2018   Primary osteoarthritis of left shoulder 10/31/2018   Long term (current) use of anticoagulants 05/22/2018   Ventral hernia 03/03/2014   OSA (obstructive sleep apnea) 07/12/2012   Hemochromatosis 02/14/2011   Actinic keratosis 02/14/2011   Hypertriglyceridemia 06/13/2010   External hemorrhoids 03/21/2010   History of aortic valve replacement 10/13/2009   Malignant neoplasm of prostate ([place]) 06/17/2009   Bilateral sensorineural hearing loss 06/16/2009   Essential hypertension 06/16/2009   Coronary atherosclerosis of native coronary artery 06/15/2009    Erectile dysfunction 05/18/2009   Obesity, unspecified 04/29/2008   Past Medical History:  Past Medical History:  Diagnosis Date   Actinic keratosis 02/14/2011   Allergy    Aneurysm of ascending aorta without rupture ([place]) 01/06/2022   Aortic stenosis    Arthritis    Bilateral sensorineural hearing loss 06/16/2009   CAD (coronary artery disease)    cabg   Complication of anesthesia    Coronary atherosclerosis of native coronary artery 06/15/2009   Diabetic polyneuropathy 11/11/2018   Dyslipidemia [id number]   Embolic stroke involving left middle cerebral artery  05/30/2021   Erectile dysfunction 05/18/2009   Essential hypertension 06/16/2009   Expressive aphasia 06/25/2021   External hemorrhoids 03/21/2010   Gout 04/09/2015   Heart murmur    Hemochromatosis 02/14/2011   History of aortic valve replacement 10/13/2009   History of CABG 11/05/2018   2005- 1. Coronary artery bypass grafting x4 with LIMA-LAD, SVG-OM1, SVG-AM and dCFX.  2. Aortic valve replacement with [employer] AVR 3. Septal myomectomy.  4. Myoview low risk 2016   History of hepatitis 01/31/1972   Hypertriglyceridemia 06/13/2010   Hypertrophic obstructive cardiomyopathy 05/06/2008   SP septal myomectomy at surgery 2005 Echo Nov 2018- Hyperdynamic LVEF with severe basal septal hypertrophy. There is chordal SAM with resting gradient of 16 mmHg that increases to 85 mmHg with Valsalva        Leg weakness, bilateral 01/18/2021   Long term (current) use of anticoagulants 05/22/2018   Lumbar stenosis with neurogenic claudication 02/04/2021   Malignant neoplasm of prostate 06/17/2009   Mechanical heart valve present    Manufacturer: Sherlean Foot #: [id number]  Model #: [id number]. Card states MRI compatible with 3 teslas or less.  Obesity, unspecified 04/29/2008   OSA (obstructive sleep apnea) 07/12/2012   moderate-severe; uses CPAP nightly   PONV (postoperative nausea and vomiting)    after CABG- slow to wake up    Presence of prosthetic heart valve    Primary osteoarthritis of left shoulder 10/31/2018   S/P lumbar laminectomy 02/04/2021   Stage 3 chronic kidney disease due to type 2 diabetes mellitus 11/11/2018   Tobacco use 06/16/2009   Type 2 diabetes mellitus with hyperglycemia    Ventral hernia 03/03/2014   Past Surgical History:  Past Surgical History:  Procedure Laterality Date   AORTIC VALVE REPLACEMENT  11/14/2003   [facility]   APPENDECTOMY  [place]   CORONARY ANGIOGRAPHY  10/11/2021   CORONARY ARTERY BYPASS GRAFT  10/2003   [place]   right, inguinal   HERNIA REPAIR  2002   left, inguinal   HIP SURGERY  2006   right hip   IR CT HEAD LTD  02/03/2022   IR PERCUTANEOUS ART THROMBECTOMY/INFUSION INTRACRANIAL INC DIAG ANGIO  01/24/2022   LUMBAR LAMINECTOMY/DECOMPRESSION MICRODISCECTOMY Bilateral 02/04/2021   Procedure: Bilateral Lumbar Two-Three Laminectomy;  Surgeon: Kristeen Miss, MD;  Location: [place];  Service: Neurosurgery;  Laterality: Bilateral;  3C/RM 20   PILONIDAL CYST EXCISION  1964   prostate seed implant  03/2010   RADIOLOGY WITH ANESTHESIA N/A 01/24/2022   Procedure: IR WITH ANESTHESIA;  Surgeon: Radiologist, Medication, MD;  Location: [facility];  Service: Radiology;  Laterality: N/A;   TEE WITHOUT CARDIOVERSION N/A 03/14/2022   Procedure: TRANSESOPHAGEAL ECHOCARDIOGRAM (TEE);  Surgeon: [name], MD;  Location: [facility];  Service: Cardiology;  Laterality: N/A;   TONSILLECTOMY  childhood   HPI:  Pt is a 77 y.o. male presented with acute word finding difficulties and slurred speech. MRI brain 3/10: Positive for a tiny acute cortical infarct of the left frontal operculum, within an area of chronic Left MCA middle division infarct with encephalomalacia and laminar necrosis. No acute hemorrhage or mass effect. PMH: CAD s/p CABG, aortic valve replacement, PAF, HLD, HTN. Pt evaluated by SLP 01/26/22 and thought to be WNL at that time so  no SLP services were recommended.   Assessment / Plan / Recommendation Clinical Impression  Pt was seen for speech-language evaluation with his wife present. Both parties agreed that the pt's aphasia has improved since its acute onset days prior. Pt reported that he currently receives SLP services via home health for aphasia, but that his reading, processing, and word retrieval are currently worse than at baseline. He was evaluated with subtests of the Bedside Western Aphasia Battery-Revised and the Quick Aphasia Battery. He presented with mild non-fluent aphasia. He was able to follow up to 3-step commands and completed structured naming tasks with some self-correction. He required repetition and/or use of compensatory strategies for consistent accuracy during paragraph-level auditory and reading comprehension tasks. He communicated at the conversational level, but phonemic paraphasias were inconsistently observed and intermittent word retrieval difficulty negatively impacted speaking rate. Skilled SLP services are clinically indicated at this time.     SLP Assessment  SLP Recommendation/Assessment: Patient needs continued Speech [place] [facility] SLP Visit Diagnosis: Aphasia (R47.01)    Recommendations for follow up therapy are one component of a multi-disciplinary discharge planning process, led by the attending physician.  Recommendations may be updated based on patient status, additional functional criteria and insurance authorization.    Follow Up Recommendations  Home health SLP  Assistance Recommended at Discharge  Set up Supervision/Assistance  Functional Status Assessment Patient has had a recent decline in their functional status and demonstrates the ability to make significant improvements in function in a reasonable and predictable amount of time.  Frequency and Duration min 1 x/week  2 weeks      SLP Evaluation       Comprehension  Auditory Comprehension Overall  Auditory Comprehension: Impaired Yes/No Questions: Impaired Basic Immediate Environment Questions:  (4/4) Complex Questions:  (4/4) Paragraph Comprehension (via yes/no questions):  (4/4 with pt's independent use of compensatory strategies) Commands: Impaired Two Step Basic Commands:  (5/5) Multistep Basic Commands:  (2/2 with self-correction) Conversation: Complex    Expression Expression Primary Mode of Expression: Verbal Verbal Expression Overall Verbal Expression: Impaired Initiation: Impaired Automatic Speech: Counting;Day of week;Month of year (with slightly increased processing) Level of Generative/Spontaneous Verbalization: Conversation Repetition: Impaired Level of Impairment: Sentence level (4/5; 5/5 with repetition; hearing may have been a factor) Naming: Impairment Responsive:  (5/5 with self-correction) Confrontation: Impaired Convergent:  (5/6) Divergent: Not tested Verbal Errors: Phonemic paraphasias Pragmatics: No impairment   Oral / Motor  Oral Motor/Sensory Function Overall Oral Motor/Sensory Function: Within functional limits Motor Speech Overall Motor Speech: Appears within functional limits for tasks assessed Respiration: Within functional limits Phonation: Normal Resonance: Within functional limits Articulation: Impaired Level of Impairment: Conversation Intelligibility: Intelligible Motor Planning: Witnin functional limits           Noami Bove I. Hardin Negus, [place], [place] Office number [phone]  Horton Marshall 04/10/2022, 4:05 PM

## 2022-04-10 NOTE — Progress Notes (Signed)
VASCULAR LAB    TCD has been performed.  See CV proc for preliminary results.   Sears Oran, RVT 04/10/2022, 12:27 PM

## 2022-04-10 NOTE — ED Notes (Signed)
MD at bedside. 

## 2022-04-10 NOTE — Evaluation (Signed)
Occupational Therapy Evaluation Patient Details Name: [name] MRN: [id number] DOB: May 06, 1945 Today's Date: 04/10/2022   History of Present Illness 77 y.o. male presents to [facility] hospital on 04/09/2022 with acute word finding difficulties. MRI shows L frontal operculum infarct, adjacent to prior L MCA infarct. PMH includes CAD s/p CABG, aortic valve replacement, PAF, HLD, HTN.   Clinical Impression   [name] was evaluated s/p the above admission list. He is mod I at baseline. Per report he is active with PT/OT/SLP [facility] services. Upon evaluation he was limited by baseline HOH, generalized weakness and limited insight. Overall he requires up to min G for mobility and ADLs with cues for safety. Pt will benefit from continued acute OT services. Recommend d/c to home to resume current [facility] services.       Recommendations for follow up therapy are one component of a multi-disciplinary discharge planning process, led by the attending physician.  Recommendations may be updated based on patient status, additional functional criteria and insurance authorization.   Follow Up Recommendations  Home health OT (resume current [facility] services)     Assistance Recommended at Discharge Intermittent Supervision/Assistance  Patient can return home with the following A little help with bathing/dressing/bathroom;Assistance with cooking/housework;Direct supervision/assist for medications management;Direct supervision/assist for financial management;Assist for transportation    Functional Status Assessment  Patient has had a recent decline in their functional status and demonstrates the ability to make significant improvements in function in a reasonable and predictable amount of time.  Equipment Recommendations  None recommended by OT       Precautions / Restrictions Precautions Precautions: Fall Restrictions Weight Bearing Restrictions: No      Mobility Bed Mobility Overal bed mobility: Needs Assistance Bed  Mobility: Supine to Sit, Sit to Supine     Supine to sit: Supervision Sit to supine: Supervision        Transfers Overall transfer level: Needs assistance Equipment used: None Transfers: Sit to/from Stand Sit to Stand: Supervision                  Balance Overall balance assessment: Needs assistance Sitting-balance support: Feet supported Sitting balance-Leahy Scale: Good     Standing balance support: No upper extremity supported, During functional activity Standing balance-Leahy Scale: Fair                             ADL either performed or assessed with clinical judgement   ADL Overall ADL's : Needs assistance/impaired Eating/Feeding: Independent   Grooming: Set up;Sitting   Upper Body Bathing: Set up;Sitting   Lower Body Bathing: Min guard;Sit to/from stand   Upper Body Dressing : Set up;Sitting   Lower Body Dressing: Min guard;Sit to/from stand   Toilet Transfer: Min guard;Ambulation   Toileting- Clothing Manipulation and Hygiene: Supervision/safety;Sitting/lateral lean       Functional mobility during ADLs: Min guard General ADL Comments: limited by [facility], generalized stiffness due to being in bed >24hours     Vision Baseline Vision/History: 0 No visual deficits Vision Assessment?: No apparent visual deficits     Perception Perception Perception Tested?: No   Praxis Praxis Praxis tested?: Not tested    Pertinent Vitals/Pain Pain Assessment Pain Assessment: No/denies pain     Hand Dominance Right   Extremity/Trunk Assessment Upper Extremity Assessment Upper Extremity Assessment: Overall WFL for tasks assessed   Lower Extremity Assessment Lower Extremity Assessment: Defer to PT evaluation   Cervical / Trunk Assessment Cervical /  Trunk Assessment: Normal   Communication Communication Communication: HOH   Cognition Arousal/Alertness: Awake/alert Behavior During Therapy: WFL for tasks assessed/performed Overall  Cognitive Status: No family/caregiver present to determine baseline cognitive functioning                                 General Comments: difficult to fully assess due to [facility] overall oriented and following commands with increased time for processing. likely baseline     General Comments  BP high but stable            Home Living Family/patient expects to be discharged to:: Private residence Living Arrangements: Spouse/significant other Available Help at Discharge: Family;Available 24 hours/day Type of Home: House Home Access: Level entry     Home Layout: One level     Bathroom Shower/Tub: Tub/shower unit;Walk-in shower   Bathroom Toilet: Standard     Home Equipment: Rollator (4 wheels);Cane - single point;Toilet riser;Grab bars - toilet          Prior Functioning/Environment Prior Level of Function : Independent/Modified Independent;Driving             Mobility Comments: rollator for longer community distances, SPC in the home intermittently ADLs Comments: indep        OT Problem List: Decreased activity tolerance;Decreased safety awareness;Decreased cognition      OT Treatment/Interventions: Self-care/ADL training;Therapeutic exercise;DME and/or AE instruction;Balance training;Patient/family education    OT Goals(Current goals can be found in the care plan section) Acute Rehab OT Goals Patient Stated Goal: to get a bed OT Goal Formulation: With patient Potential to Achieve Goals: Good ADL Goals Additional ADL Goal #1: Pt will complete all BADLs with mod I Additional ADL Goal #2: Pt will indep complete IADL medication management task  OT Frequency: Min 2X/week       AM-PAC OT "6 Clicks" Daily Activity     Outcome Measure Help from another person eating meals?: None Help from another person taking care of personal grooming?: A Little Help from another person toileting, which includes using toliet, bedpan, or urinal?: A Little Help  from another person bathing (including washing, rinsing, drying)?: A Little Help from another person to put on and taking off regular upper body clothing?: None Help from another person to put on and taking off regular lower body clothing?: A Little 6 Click Score: 20   End of Session Nurse Communication: Mobility status (IV problems)  Activity Tolerance: Patient tolerated treatment well Patient left: in bed;with call bell/phone within reach  OT Visit Diagnosis: Other abnormalities of gait and mobility (R26.89);Muscle weakness (generalized) (M62.81)                Time: [id number] OT Time Calculation (min): 29 min Charges:  OT General Charges $OT Visit: 1 Visit OT Evaluation $OT Eval Moderate Complexity: 1 Mod OT Treatments $Self Care/Home Management : 8-22 mins  Shade Flood, OTR/L Acute Rehabilitation Services Office [phone] Secure Chat Communication Preferred   Elliot Cousin 04/10/2022, 9:10 AM

## 2022-04-10 NOTE — ED Notes (Signed)
Patient IV site is leaking, new IV placed. Heparin moved to right hand IV.

## 2022-04-10 NOTE — Progress Notes (Addendum)
STROKE TEAM PROGRESS NOTE   INTERVAL HISTORY Patient is seen in his room with his wife at the bedside.  He presented yesterday with acute onset aphasia with trouble getting his words out and slurred speech.  He continues to have subtle paraphasic errors,, but this may be due to his recent stroke in December.  He is noted to be on Plavix and Coumadin for mechanical heart valve and atrial fibrillation, however his INR is subtherapeutic at 2.2.  Patient is also on Plavix for unclear reason.  Vitals:   04/10/22 0700 04/10/22 0800 04/10/22 0903 04/10/22 1000  BP: (!) 147/70 (!) 155/65  (!) 139/93  Pulse: 61 69  92  Resp: '20 20  16  '$ Temp:   98.3 F (36.8 C)   TempSrc:   Oral   SpO2: 95% 96%  93%  Weight:      Height:       CBC:  Recent Labs  Lab 04/09/22 0630 04/10/22 0030  WBC 8.3 9.7  NEUTROABS 6.2  --   HGB 13.3 13.7  HCT 39.5 40.4  MCV 87.4 86.3  PLT 255 [id number]   Basic Metabolic Panel:  Recent Labs  Lab 04/09/22 0540 04/09/22 0630  NA 139 138  K 3.4* 3.3*  CL 102 102  CO2  --  23  GLUCOSE 152* 152*  BUN 34* 33*  CREATININE 1.20 1.27*  CALCIUM  --  9.0   Lipid Panel:  Recent Labs  Lab 04/10/22 0030  CHOL 136  TRIG 252*  HDL 30*  CHOLHDL 4.5  VLDL 50*  LDLCALC 56   HgbA1c: No results for input(s): "HGBA1C" in the last 168 hours. Urine Drug Screen:  Recent Labs  Lab 04/09/22 0753  LABOPIA NONE DETECTED  COCAINSCRNUR NONE DETECTED  LABBENZ NONE DETECTED  AMPHETMU NONE DETECTED  THCU NONE DETECTED  LABBARB NONE DETECTED    Alcohol Level  Recent Labs  Lab 04/09/22 0630  ETH <10    IMAGING past 24 hours No results found.  PHYSICAL EXAM General: Alert, well-nourished, well-developed elderly patient in no acute distress Respiratory: Regular, unlabored respirations on room air  NEURO:  Mental Status: AA&Ox3  Speech/Language: speech is without dysarthria but with subtle paraphasic errors.  Naming, repetition, fluency, and comprehension  intact.  Cranial Nerves:  II: PERRL.  III, IV, VI: EOMI. Eyelids elevate symmetrically.  V: Sensation is intact to light touch and symmetrical to face.  VII: Smile is symmetrical.  VIII: hearing intact to voice. IX, X: Phonation is normal.  [id number] shrug 5/5. XII: tongue is midline without fasciculations. Motor: 5/5 strength to all muscle groups tested.  Tone: is normal and bulk is normal Sensation- Intact to light touch bilaterally.  Coordination: FTN intact bilaterally.No drift.  Gait- deferred   ASSESSMENT/PLAN Mr. TARL MARKSBERRY is a 77 y.o. male with history of CAD status post CABG, aortic valve replacement with mechanical valve on Coumadin, A-fib, hyperlipidemia, hypertension, stroke in December 2023, sleep apnea on CPAP, stage III chronic kidney disease and diabetes with neuropathy presenting with acute onset aphasia, trouble getting his words out and slurred speech.  He continues to have subtle paraphasic errors,, but this may be due to his recent stroke in December.  He is noted to be on Plavix and Coumadin for mechanical heart valve and atrial fibrillation, however his INR is subtherapeutic at 2.2.  Stroke: Small left MCA infarct likely embolism from mechanical heart valve Etiology: Likely embolic in the setting of subtherapeutic INR CT head No acute  abnormality.  Chronic left MCA and cerebellar infarcts MRI tiny acute infarct in left frontal operculum near chronic left MCA infarct, occasional punctate chronic microhemorrhages and chronic bilateral cerebellar and left MCA infarcts Intracranial Doppler pending Carotid Doppler pending 2D Echo pending LDL 56 HgbA1c 8.2 VTE prophylaxis -fully anticoagulated with Coumadin    Diet   Diet heart healthy/carb modified Room service appropriate? Yes; Fluid consistency: Thin   clopidogrel 75 mg daily and warfarin daily prior to admission, now on clopidogrel 75 mg daily and warfarin daily.  Will consider discontinuing Plavix  once INR therapeutic Therapy recommendations: Pending Disposition: Pending  Atrial fibrillation Patient has history of PAF Fully anticoagulated with Coumadin  Mechanical aortic valve Patient had mechanical aortic valve placed 18 years ago Fully anticoagulated with Coumadin, but INR is subtherapeutic at 2.2 Consult pharmacy to bring INR into therapeutic range  Hypertension Home meds: Norvasc 5 mg daily Stable Permissive hypertension (OK if < 220/120) but gradually normalize in 5-7 days Long-term BP goal normotensive  Hyperlipidemia Home meds: Rosuvastatin 40 mg daily and fenofibrate 145 mg daily, resumed in hospital LDL 56, goal < 70 Continue statin at discharge  Diabetes type II Uncontrolled Home meds: Farxiga 10 mg daily, insulin aspart 16 units with breakfast, 14 units with lunch and 16 units with dinner, insulin glargine 22 units daily HgbA1c 8.2, goal < 7.0 CBGs Recent Labs    04/09/22 1726 04/10/22 0802  GLUCAP 119* 161*  Follow-up with PCP for better diabetes control SSI  Other Stroke Risk Factors Advanced Age >/= 84  Cigarette smoker advised to stop smoking Obesity, Body mass index is 31.75 kg/m., BMI >/= 30 associated with increased stroke risk, recommend weight loss, diet and exercise as appropriate  Hx stroke Coronary artery disease Obstructive sleep apnea, on CPAP at home  Other Active Problems None  Hospital day # [place] , MSN, AGACNP-BC [facility] See Amion for schedule and pager information 04/10/2022 12:11 PM  I have personally obtained history,examined this patient, reviewed notes, independently viewed imaging studies, participated in medical decision making and plan of care.ROS completed by me personally and pertinent positives fully documented  I have made any additions or clarifications directly to the above note. Agree with note above.  Patient presented with small left insular infarct likely due to cardiogenic  embolism for mechanical heart valve with subtherapeutic anticoagulation.  Recommend increase INR goal to 2.5-3.5.  Discontinue Plavix as I do not find a definite indication for using it did not help anyway.  Resume warfarin continue heparin bridge till INR is in therapeutic range.  Mobilize out of bed.  Therapy consults.  Greater than 50% time during this 50-minute visit were spent in counseling and coordination of care about his embolic stroke and discussion about anticoagulation.  INR: 3 questions.  Antony Contras, MD Medical Director [facility] Pager: [phone] 04/10/2022 1:41 PM   To contact Stroke Continuity provider, please refer to [email or web address] After hours, contact General Neurology

## 2022-04-10 NOTE — Progress Notes (Signed)
[place] for heparin  Indication:  mechanical aortic valve  and Afib  No Known Allergies  Patient Measurements: Height: '5\' 9"'$  (175.3 cm) Weight: 97.5 kg (215 lb) IBW/kg (Calculated) : 70.7 Heparin Dosing Weight: 91.1kg   Vital Signs: Temp: 97.8 F (36.6 C) (03/11 0035) Temp Source: Oral (03/11 0035) BP: 130/63 (03/11 0045) Pulse Rate: 69 (03/11 0045)  Labs: Recent Labs    04/09/22 0540 04/09/22 0630 04/10/22 0030  HGB 13.6 13.3 13.7  HCT 40.0 39.5 40.4  PLT  --  255 279  APTT  --  31  --   LABPROT  --  24.6* 24.0*  INR  --  2.2* 2.2*  HEPARINUNFRC  --   --  <0.10*  CREATININE 1.20 1.27*  --      Estimated Creatinine Clearance: 56.1 mL/min (A) (by C-G formula based on SCr of 1.27 mg/dL (H)).  Assessment: 77 y.o. male admitted with CVA, h/o mechanical AVR and AFib, INR subtherapeutic, for heparin  Goal of Therapy:  INR: 2.5-3.5 Anti-Xa: 0.3-0.5 units/mL Monitor platelets by anticoagulation protocol: Yes   Plan:  Increase Heparin 1300 units/hr Check heparin level in 8 hours.   Caryl Pina 04/10/2022,1:30 AM

## 2022-04-10 NOTE — Progress Notes (Signed)
VASCULAR LAB    Carotid duplex has been performed.  See CV proc for preliminary results.   Kori Colin, RVT 04/10/2022, 12:27 PM

## 2022-04-10 NOTE — Progress Notes (Signed)
   Overnight fellow note reviewed - agree with recommendations. INR out of desired range of 2.5-3.5. Repeat echo ordered - no real role for repeat TEE even if thrombus was noted on the valve, would generally recommend more intense anticoagulation. He just needs more strict INR control. Will follow-up on echo, otherwise, target higher end of INR range if possible.  Pixie Casino, MD, [facility], [place] Director of the [facility]  [facility] Diplomate of the American Board of Clinical Lipidology Attending Cardiologist  Direct Dial: [phone]  Fax: [phone]  Website:  [email or web address]

## 2022-04-10 NOTE — Consult Note (Signed)
Cardiology Consultation   Patient ID: MADDUX SZOT MRN: [id number]; DOB: [birth year]  Admit date: 04/09/2022 Date of Consult: 04/10/2022  PCP:  [name], NP   [facility] Prov[facility] Cardiologist:  Kirk Ruths, MD        Patient Profile:   [name] is a 77 y.o. male with a hx of CAD S\P CABG, aortic valve replacement in 2011 on Coumadin, paroxysmal A-fib, hyperlipidemia, hypertension prior ischemic strokes with no current residual deficits, obstructive sleep apnea on CPAP, stage III chronic kidney disease, former tobacco smoker and DM with neuropathy  who is being seen 04/10/2022 for the evaluation of  small stroke at the request of Dr. Linda Hedges  History of Present Illness:   Mr. Wavra is a 77 y.o. male with a hx of CAD S\P CABG, aortic valve replacement in 2011 on Coumadin, paroxysmal A-fib, hyperlipidemia, hypertension prior ischemic strokes with no current residual deficits, obstructive sleep apnea on CPAP, stage III chronic kidney disease, former tobacco smoker and DM with neuropathy  who is being seen 04/10/2022 for the evaluation of  small stroke at the request of Dr. Linda Hedges. He came to the ED today with complains of aphasia. EMS was called; his symptoms corrected by the time he came here. CT head with no acute process.  MRI brain with tiny acute cortical infarct in left frontal operculum adjacent to a large, chronic left MCA infarction. He is on Plavix and Coumadin with most recent INR subtherapeutic at 2.2; his INR goal for his mechanical aortic valve is 2.5-3.5. Considering this is his multiple stroke episodes, cardiology was consulted regarding mechanical aortic valve. Of note he had a TEE done last month which showed normal AV. Patient is status post coronary artery bypassing graft (LIMA to the LAD, saphenous vein graft to the marginal, saphenous vein graft sequentially to the right coronary artery and distal circumflex) as well as aortic valve  replacement (St. Jude aortic valve) in 2005. He also had a septal myectomy at that time. Carotid Dopplers November 2022 showed no significant stenosis.  Had CVA May 2023. Also has had Afib. CTA October 2023 showed 4.3 cm ascending aortic aneurysm, 7 mm right lower lobe pulmonary nodule with follow-up recommended 6 to 12 months.  Patient had CVA December 2023.  CTA showed occlusion of left M1.  He had mechanical thrombectomy.  Echocardiogram December 2023 showed normal LV function, moderate left atrial enlargement, mechanical aortic valve replacement with mean gradient 8 mmHg and no aortic insufficiency, severely dilated ascending aorta at 46 mm.  Event was felt likely thrombotic due to mechanical heart valve (INR on admission was 2.2).    Past Medical History:  Diagnosis Date   Actinic keratosis 02/14/2011   Allergy    Aneurysm of ascending aorta without rupture ([place]) 01/06/2022   Aortic stenosis    Arthritis    Bilateral sensorineural hearing loss 06/16/2009   CAD (coronary artery disease)    cabg   Complication of anesthesia    Coronary atherosclerosis of native coronary artery 06/15/2009   Diabetic polyneuropathy 11/11/2018   Dyslipidemia [id number]   Embolic stroke involving left middle cerebral artery  05/30/2021   Erectile dysfunction 05/18/2009   Essential hypertension 06/16/2009   Expressive aphasia 06/25/2021   External hemorrhoids 03/21/2010   Gout 04/09/2015   Heart murmur    Hemochromatosis 02/14/2011   History of aortic valve replacement 10/13/2009   History of CABG 11/05/2018   2005- 1. Coronary artery bypass grafting x4 with LIMA-LAD, SVG-OM1,  SVG-AM and dCFX.  2. Aortic valve replacement with St Jude AVR 3. Septal myomectomy.  4. Myoview low risk 2016   History of hepatitis 01/31/1972   Hypertriglyceridemia 06/13/2010   Hypertrophic obstructive cardiomyopathy 05/06/2008   SP septal myomectomy at surgery 2005 Echo Nov 2018- Hyperdynamic LVEF with severe basal septal  hypertrophy. There is chordal SAM with resting gradient of 16 mmHg that increases to 85 mmHg with Valsalva        Leg weakness, bilateral 01/18/2021   Long term (current) use of anticoagulants 05/22/2018   Lumbar stenosis with neurogenic claudication 02/04/2021   Malignant neoplasm of prostate 06/17/2009   Mechanical heart valve present    Manufacturer: Sherlean Foot #: [id number]  Model #: [id number]. Card states MRI compatible with 3 teslas or less.   Obesity, unspecified 04/29/2008   OSA (obstructive sleep apnea) 07/12/2012   moderate-severe; uses CPAP nightly   PONV (postoperative nausea and vomiting)    after CABG- slow to wake up   Presence of prosthetic heart valve    Primary osteoarthritis of left shoulder 10/31/2018   S/P lumbar laminectomy 02/04/2021   Stage 3 chronic kidney disease due to type 2 diabetes mellitus 11/11/2018   Tobacco use 06/16/2009   Type 2 diabetes mellitus with hyperglycemia    Ventral hernia 03/03/2014    Past Surgical History:  Procedure Laterality Date   AORTIC VALVE REPLACEMENT  11/14/2003   [facility]   APPENDECTOMY  [place]   CORONARY ANGIOGRAPHY  10/11/2021   CORONARY ARTERY BYPASS GRAFT  10/2003   [place]   right, inguinal   HERNIA REPAIR  2002   left, inguinal   HIP SURGERY  2006   right hip   IR CT HEAD LTD  02/03/2022   IR PERCUTANEOUS ART THROMBECTOMY/INFUSION INTRACRANIAL INC DIAG ANGIO  01/24/2022   LUMBAR LAMINECTOMY/DECOMPRESSION MICRODISCECTOMY Bilateral 02/04/2021   Procedure: Bilateral Lumbar Two-Three Laminectomy;  Surgeon: Kristeen Miss, MD;  Location: [facility];  Service: Neurosurgery;  Laterality: Bilateral;  3C/RM 20   PILONIDAL CYST EXCISION  1964   prostate seed implant  03/2010   RADIOLOGY WITH ANESTHESIA N/A 01/24/2022   Procedure: IR WITH ANESTHESIA;  Surgeon: Radiologist, Medication, MD;  Location: [facility];  Service: Radiology;  Laterality: N/A;   TEE WITHOUT CARDIOVERSION N/A  03/14/2022   Procedure: TRANSESOPHAGEAL ECHOCARDIOGRAM (TEE);  Surgeon: [name], MD;  Location: [facility];  Service: Cardiology;  Laterality: N/A;   TONSILLECTOMY  childhood       Inpatient Medications: Scheduled Meds:   stroke: early stages of recovery book   Does not apply Once    stroke: early stages of recovery book   Does not apply Once   amLODipine  5 mg Oral q AM   clopidogrel  75 mg Oral Daily   dapagliflozin propanediol  10 mg Oral QAC breakfast   fenofibrate  160 mg Oral Daily   furosemide  20 mg Oral q AM   insulin aspart  14 Units Subcutaneous Q0600   insulin aspart  16 Units Subcutaneous Q breakfast   insulin aspart  16 Units Subcutaneous Q2000   insulin glargine-yfgn  22 Units Subcutaneous QHS   isosorbide mononitrate  30 mg Oral Daily   metoprolol succinate  25 mg Oral Daily   rosuvastatin  40 mg Oral QHS   tamsulosin  0.4 mg Oral Daily   Warfarin - Pharmacist Dosing Inpatient   Does not apply [id number]  Continuous Infusions:  sodium chloride 50 mL/hr at 04/09/22 2007   heparin 1,100 Units/hr (04/09/22 1851)   PRN Meds: acetaminophen **OR** acetaminophen (TYLENOL) oral liquid 160 mg/5 mL **OR** acetaminophen, senna-docusate, triamcinolone cream  Allergies:   No Known Allergies  Social History:   Social History   Socioeconomic History   Marital status: Married    Spouse name: Not on file   Number of children: Not on file   Years of education: 18   Highest education level: Master's degree (e.g., MA, MS, MEng, MEd, MSW, MBA)  Occupational History   Occupation: Retired  Tobacco Use   Smoking status: Some Days    Types: Cigars    Last attempt to quit: 06/2021    Years since quitting: 0.7   Smokeless tobacco: Never   Tobacco comments:    Pt states he smokes 1 cigar about once a month. 06/01/2021    Pt quit  Cigars 06/2021  Substance and Sexual Activity   Alcohol use: Not Currently    Comment: wine maybe 1 per month   Drug use: No   Sexual  activity: Never  Other Topics Concern   Not on file  Social History Narrative   ** Merged History Encounter ** Right Handed    Lives in a one sotyr home. One step leads to front     Lives with wife   Retired   Occasionally caffeine   Social Determinants of Health   Financial Resource Strain: Not on file  Food Insecurity: No Food Insecurity (02/10/2022)   Hunger Vital Sign    Worried About Running Out of Food in the Last Year: Never true    Ran Out of Food in the Last Year: Never true  Transportation Needs: No Transportation Needs (02/10/2022)   PRAPARE - Hydrologist (Medical): No    Lack of Transportation (Non-Medical): No  Physical Activity: Not on file  Stress: Not on file  Social Connections: Not on file  Intimate Partner Violence: Not on file    Family History:   Family History  Problem Relation Age of Onset   Heart disease Mother    Stroke Mother    Heart disease Father    Asperger's syndrome Son    Hyperlipidemia Son    Coronary artery disease Brother    Cancer Neg Hx        negative for colon cancer     ROS:  Please see the history of present illness.  All other ROS reviewed and negative.     Physical Exam/Data:   Vitals:   04/09/22 2345 04/10/22 0000 04/10/22 0015 04/10/22 0035  BP: 133/72 131/68 (!) 144/68   Pulse: 71 80 67 67  Resp: '18 20 18 16  '$ Temp:    97.8 F (36.6 C)  TempSrc:    Oral  SpO2: 95% 94% 95% 98%  Weight:      Height:        Intake/Output Summary (Last 24 hours) at 04/10/2022 0057 Last data filed at 04/09/2022 1900 Gross per 24 hour  Intake --  Output 2000 ml  Net -2000 ml      04/09/2022    6:14 AM 03/21/2022   10:58 AM 03/14/2022    7:56 AM  Last 3 Weights  Weight (lbs) 215 lb 211 lb 215 lb  Weight (kg) 97.523 kg 95.709 kg 97.523 kg     Body mass index is 31.75 kg/m.  General:  Well nourished, well developed, in no  acute distress HEENT: normal Neck: no JVD Vascular: No carotid bruits;  Distal pulses 2+ bilaterally Cardiac:  normal S1, S2; RRR; mechanical heart sound heard Lungs:  clear to auscultation bilaterally, no wheezing, rhonchi or rales  Abd: soft, nontender, no hepatomegaly  Ext: no edema Musculoskeletal:  No deformities, BUE and BLE strength normal and equal Skin: warm and dry  Neuro:  CNs 2-12 intact, no focal abnormalities noted Psych:  Normal affect   EKG:  The EKG was personally reviewed and demonstrates:  no ST elevation.   Relevant CV Studies:  Carotid Dopplers November 2022 showed no significant stenosis.    Cardiac catheterization revealed severe three-vessel coronary artery disease; patent LIMA to the LAD with 80% stenosis in the very distal LAD; occluded saphenous vein graft to the left circumflex/right coronary artery and occluded saphenous vein graft to the OM; treated medically.   Echocardiogram showed normal LV function, grade 1 diastolic dysfunction, mild mitral regurgitation, mild tricuspid regurgitation, status post aortic valve replacement with mean gradient across the aortic valve of 8 mmHg and dilated aorta at 4.4 cm.   CTA October 2023 showed 4.3 cm ascending aortic aneurysm, 7 mm right lower lobe pulmonary nodule with follow-up recommended 6 to 12 months.     Echocardiogram December 2023 showed normal LV function, moderate left atrial enlargement, mechanical aortic valve replacement with mean gradient 8 mmHg and no aortic insufficiency, severely dilated ascending aorta at 46 mm   Laboratory Data:  High Sensitivity Troponin:  No results for input(s): "TROPONINIHS" in the last 720 hours.   Chemistry Recent Labs  Lab 04/09/22 0540 04/09/22 0630  NA 139 138  K 3.4* 3.3*  CL 102 102  CO2  --  23  GLUCOSE 152* 152*  BUN 34* 33*  CREATININE 1.20 1.27*  CALCIUM  --  9.0  GFRNONAA  --  58*  ANIONGAP  --  13    Recent Labs  Lab 04/09/22 0630  PROT 6.8  ALBUMIN 3.5  AST 23  ALT 15  ALKPHOS 41  BILITOT 0.6   Lipids No results  for input(s): "CHOL", "TRIG", "HDL", "LABVLDL", "LDLCALC", "CHOLHDL" in the last 168 hours.  Hematology Recent Labs  Lab 04/09/22 0540 04/09/22 0630  WBC  --  8.3  RBC  --  4.52  HGB 13.6 13.3  HCT 40.0 39.5  MCV  --  87.4  MCH  --  29.4  MCHC  --  33.7  RDW  --  14.0  PLT  --  255   Thyroid No results for input(s): "TSH", "FREET4" in the last 168 hours.  BNPNo results for input(s): "BNP", "PROBNP" in the last 168 hours.  DDimer No results for input(s): "DDIMER" in the last 168 hours.   Radiology/Studies:  MR BRAIN WO CONTRAST  Result Date: 04/09/2022 CLINICAL DATA:  77 year old male with acute neurologic deficit primarily abnormal speech, now clinically resolved. Chronic left MCA and cerebellar infarcts. EXAM: MRI HEAD WITHOUT CONTRAST TECHNIQUE: Multiplanar, multiecho pulse sequences of the brain and surrounding structures were obtained without intravenous contrast. COMPARISON:  Head CT earlier today.  Brain MRI 01/25/2022. FINDINGS: Brain: Small area of left frontal operculum restricted diffusion superimposed on underlying chronic left middle MCA territory encephalomalacia. See series 5, image 86 and series 8, image 22. No significant cytotoxic edema. Adjacent encephalomalacia and laminar necrosis with mild if any hemosiderin. Intrinsic T1 hyperintensity has developed throughout the left lentiform, which appears stable on T2 and FLAIR. Questionable increased mineralization on SWI. No other restricted  diffusion. Stable chronic bilateral cerebellar infarcts. Stable gray and white matter signal otherwise. Small chronic microhemorrhages in the periventricular white matter at the left frontal horn, right occipital horn are stable on SWI. No midline shift, mass effect, evidence of mass lesion, ventriculomegaly, extra-axial collection or acute intracranial hemorrhage. Cervicomedullary junction and pituitary are within normal limits. Vascular: Major intracranial vascular flow voids are stable  since last year. Skull and upper cervical spine: Stable visible cervical spine with chronic ligamentous hypertrophy about the odontoid. Visualized bone marrow signal is within normal limits. Sinuses/Orbits: Stable and negative orbits. Stable mild paranasal sinus mucosal thickening. Other: Mastoids remain clear. Grossly normal visible internal auditory structures. IMPRESSION: 1. Positive for a tiny acute cortical infarct of the left frontal operculum, within an area of chronic Left MCA middle division infarct with encephalomalacia and laminar necrosis. No acute hemorrhage or mass effect. 2. Conspicuous increased intrinsic T1 signal in the left lentiform nuclei since December. Nonspecific, but favor this is a form of Wallerian degeneration secondary to the chronic ischemia in #1. 3. Elsewhere stable noncontrast MRI appearance of the brain since last year., including chronic bilateral cerebellar infarcts and occasional punctate chronic microhemorrhages. Electronically Signed   By: Genevie Ann M.D.   On: 04/09/2022 08:58   CT HEAD WO CONTRAST  Result Date: 04/09/2022 CLINICAL DATA:  77 year old male with acute neurologic deficit. Left MCA middle division infarct last year. EXAM: CT HEAD WITHOUT CONTRAST TECHNIQUE: Contiguous axial images were obtained from the base of the skull through the vertex without intravenous contrast. RADIATION DOSE REDUCTION: This exam was performed according to the departmental dose-optimization program which includes automated exposure control, adjustment of the mA and/or kV according to patient size and/or use of iterative reconstruction technique. COMPARISON:  Brain MRI 06/25/2021, CT head and CTA 06/24/2021. FINDINGS: Brain: Encephalomalacia corresponding to previous left MCA middle division infarct. Stable cerebral volume. No midline shift, ventriculomegaly, mass effect, evidence of mass lesion, intracranial hemorrhage or evidence of cortically based acute infarction. Small chronic  bilateral cerebellar infarcts are stable from the MRI last year. Vascular: Severe Calcified atherosclerosis at the skull base. No suspicious intracranial vascular hyperdensity. Skull: No acute osseous abnormality identified. Partially visible chronic dental periapical lucency. Sinuses/Orbits: Generally mild paranasal sinus mucosal thickening is stable. Tympanic cavities and mastoids remain aerated. Other: Visualized orbits and scalp soft tissues are within normal limits. IMPRESSION: 1. No acute intracranial abnormality. 2. Chronic left MCA and cerebellar infarcts. Electronically Signed   By: Genevie Ann M.D.   On: 04/09/2022 07:26     Assessment and Plan:   # Acute Minor Stroke # status post aortic valve replacement  # Thoracic aortic aneurysm # CAD s/p CABG # HTN # pAF (NSR currently)  -Patient has had multiple episodes of stroke -Coming with another TIA/minor stroke today -INR 2.2 -TEE last month showed no issues with the valve; moderate atherosclerosis in aorta -Will suggest to bridge with IV heparin. Goal INR should be 2.5-3.5 -Will need much more closer monitoring of INR.  -Repeat TTE in AM. Will discuss if another TEE is warranted -Telemetry -Continue coumadin and plavix   Signed, Jaci Lazier, MD  04/10/2022 12:57 AM

## 2022-04-10 NOTE — Progress Notes (Addendum)
[place] for heparin and warfarin  Indication:  mechanical aortic valve with subtherapeutic INR  w acute stroke   No Known Allergies  Patient Measurements: Height: '5\' 9"'$  (175.3 cm) Weight: 97.5 kg (215 lb) IBW/kg (Calculated) : 70.7 Heparin Dosing Weight: 91.1kg   Vital Signs: Temp: 98.3 F (36.8 C) (03/11 0903) Temp Source: Oral (03/11 0903) BP: 155/65 (03/11 0800) Pulse Rate: 69 (03/11 0800)  Labs: Recent Labs    04/09/22 0540 04/09/22 0630 04/10/22 0030  HGB 13.6 13.3 13.7  HCT 40.0 39.5 40.4  PLT  --  255 279  APTT  --  31  --   LABPROT  --  24.6* 24.0*  INR  --  2.2* 2.2*  HEPARINUNFRC  --   --  <0.10*  CREATININE 1.20 1.27*  --      Estimated Creatinine Clearance: 56.1 mL/min (A) (by C-G formula based on SCr of 1.27 mg/dL (H)).   Medical History: Past Medical History:  Diagnosis Date   Actinic keratosis 02/14/2011   Allergy    Aneurysm of ascending aorta without rupture ([place]) 01/06/2022   Aortic stenosis    Arthritis    Bilateral sensorineural hearing loss 06/16/2009   CAD (coronary artery disease)    cabg   Complication of anesthesia    Coronary atherosclerosis of native coronary artery 06/15/2009   Diabetic polyneuropathy 11/11/2018   Dyslipidemia [id number]   Embolic stroke involving left middle cerebral artery  05/30/2021   Erectile dysfunction 05/18/2009   Essential hypertension 06/16/2009   Expressive aphasia 06/25/2021   External hemorrhoids 03/21/2010   Gout 04/09/2015   Heart murmur    Hemochromatosis 02/14/2011   History of aortic valve replacement 10/13/2009   History of CABG 11/05/2018   2005- 1. Coronary artery bypass grafting x4 with LIMA-LAD, SVG-OM1, SVG-AM and dCFX.  2. Aortic valve replacement with St Jude AVR 3. Septal myomectomy.  4. Myoview low risk 2016   History of hepatitis 01/31/1972   Hypertriglyceridemia 06/13/2010   Hypertrophic obstructive cardiomyopathy 05/06/2008   SP  septal myomectomy at surgery 2005 Echo Nov 2018- Hyperdynamic LVEF with severe basal septal hypertrophy. There is chordal SAM with resting gradient of 16 mmHg that increases to 85 mmHg with Valsalva        Leg weakness, bilateral 01/18/2021   Long term (current) use of anticoagulants 05/22/2018   Lumbar stenosis with neurogenic claudication 02/04/2021   Malignant neoplasm of prostate 06/17/2009   Mechanical heart valve present    Manufacturer: Sherlean Foot #: [id number]  Model #: [id number]. Card states MRI compatible with 3 teslas or less.   Obesity, unspecified 04/29/2008   OSA (obstructive sleep apnea) 07/12/2012   moderate-severe; uses CPAP nightly   PONV (postoperative nausea and vomiting)    after CABG- slow to wake up   Presence of prosthetic heart valve    Primary osteoarthritis of left shoulder 10/31/2018   S/P lumbar laminectomy 02/04/2021   Stage 3 chronic kidney disease due to type 2 diabetes mellitus 11/11/2018   Tobacco use 06/16/2009   Type 2 diabetes mellitus with hyperglycemia    Ventral hernia 03/03/2014   Assessment: Patient presenting with CC of stroke like symptoms. MRI showing small acute infarct in addition more chronic MCA infarct. Patient on warfarin prior to admission: 3.'75mg'$  MWF, 7.'5mg'$  all other days. Pharmacy consulted for heparin bridge with warfarin restart. Last dose of warfarin on 3/9 around 22:00 per patient.   RN notified issues with line not  infusing correctly, new IV placed and will obtain heparin level 6h, also per cards will target upper end INR with warfarin which remains subtherapeutic at 2.2  Goal of Therapy:  INR: 2.5-3.5 Anti-Xa: 0.3-0.5  Monitor platelets by anticoagulation protocol: Yes   Plan:  Warfarin 7.5 mg PO x 1 again today Obtain 6h heparin level Daily heparin level, INR, CBC, s/s bleeding  Addendum: Heparin level obtained low end therapeutic on 1300 units/hr Increase heparin slightly to 1350 units/hr  Bertis Ruddy,  PharmD, [facility] Pharmacist ED Pharmacist Phone # [phone] 04/10/2022 9:44 AM

## 2022-04-10 NOTE — ED Notes (Signed)
[place] cath completed using sterile tech per order, bladder drained of 620m cloudy urine

## 2022-04-10 NOTE — Progress Notes (Addendum)
PROGRESS NOTE   [name]  [id number]    DOB: 14-Oct-1945    DOA: 04/09/2022  PCP: [name], NP   I have briefly reviewed patients previous medical records in [facility].  Chief Complaint  Patient presents with   Aphasia    Brief Narrative:  77 year old married [name], uses a walker or cane to ambulate for support, medical history significant for CAD s/p CABG, aortic valve replacement in 2011 on Coumadin anticoagulation, paroxysmal A-fib, hyperlipidemia, hypertension, prior multiple ischemic strokes-most recent 1 in May [id number], suspected embolic, OSA on CPAP, stage II CKD (not 3), urinary retention, former tobacco user, type II DM with neuropathy, hard of hearing, presented to the ED via EMS for acute onset of word finding difficulties that started on 3/10 approximately 5:15 AM, no other strokelike symptoms, lasted about 4 or 5 hours and then resolved spontaneously.  Reports similar speech issues during prior stroke in May which resolved over 5 to 7 days.  MRI brain confirmed tiny acute cortical infarct in the left frontal operculum.  Neurology consulted.  INR subtherapeutic at 2.2 for his AVR.  Currently on IV heparin drip.  TEE on [id number] without embolic source.   Assessment & Plan:  Active Problems:   Acute CVA (cerebrovascular accident) ([place])   History of aortic valve replacement   Long term (current) use of anticoagulants   Diabetes mellitus ([place])   Essential hypertension   Urinary retention   Acute ischemic stroke, suspecting embolic, with temporary expressive aphasia-resolved: CT head 3/10: No acute intracranial abnormalities.  Chronic left MCA and cerebellar infarcts.  MRI brain: Positive for a tiny acute cortical infarct of the left frontal operculum, within an area of chronic left MCA middle division infarct with encephalomalacia and laminar necrosis.  No acute hemorrhage or mass effect.  Chronic bilateral cerebellar infarcts and occasional punctate  chronic microhemorrhages.  LDL at goal, 56.  INR subtherapeutic, 2.2.  Currently on IV heparin bridging and Coumadin on hold pending decision regarding TEE by cardiology.  BAL <10.  UDS negative.  A1c in December 8 0.2, will repeat.  CTA head and neck (not sure if neurology still wants this because this has not been ordered).  Continue Plavix.  PT, OT and ST evaluation.  Cardiology has been consulted to determine need for repeating TEE.  Stroke team will follow.  Reports that he follows with outpatient neurology every 6 months  Urinary retention: States that he had a Foley catheter from 12/26 until Friday of last week when it was removed and has been doing in and out catheterizations at home.  Follows with Dr. Diona Fanti, [facility].  Continue In-N-Out catheterization.  Continue Flomax  Poorly controlled type II DM with hyperglycemia: Continue home dose of Lantus, meal time NovoLog and [place].  Monitor CBGs closely and adjust insulins as needed.  Repeat A1c.  Hypertension: Continue amlodipine, Imdur and Toprol-XL.  Hyperlipidemia:  LDL at goal.  Continue fenofibrate and rosuvastatin.  Hypokalemia: Replace and follow.  Paroxysmal A-fib/mechanical AVR/secondary hypercoagulable state IV heparin bridge.  Resume Coumadin when able.  Stage II chronic kidney disease Creatinine slightly higher than baseline at 1.27.  Follow BMP.  Body mass index is 31.75 kg/m./Obesity    DVT prophylaxis:   IV heparin   Code Status: Full Code:  ACP Documents: Reviewed living will and HCPOA Family Communication: None at bedside Disposition:  Status is: Observation The patient remains OBS appropriate and will d/c before 2 midnights.     Consultants:  Neurology Cardiology  Procedures:     Antimicrobials:      Subjective:  Seen in ED.  NPO.  History as above.  No recurrence of speech difficulties.  Has chronic right upper extremity tremors since December.  No other strokelike  symptoms.  Objective:   Vitals:   04/10/22 0400 04/10/22 0500 04/10/22 0600 04/10/22 0700  BP: 126/72 (!) 111/54 135/80 (!) 147/70  Pulse: (!) 59 (!) 59 (!) 58 61  Resp: '13 16 16 20  '$ Temp:  97.8 F (36.6 C)    TempSrc:      SpO2: 95% 94% 91% 95%  Weight:      Height:        General exam: Elderly [name], moderately built and obese lying comfortably propped up in bed without distress. Respiratory system: Clear to auscultation. Respiratory effort normal.  Midline sternotomy scar. Cardiovascular system: S1 & S2 heard, RRR. No JVD, murmurs, rubs, gallops or clicks. No pedal edema.  Telemetry personally reviewed: Sinus rhythm. Gastrointestinal system: Abdomen is nondistended, soft and nontender. No organomegaly or masses felt. Normal bowel sounds heard. Central nervous system: Alert and oriented. No focal neurological deficits. Extremities: Symmetric 5 x 5 power. Skin: No rashes, lesions or ulcers Psychiatry: Judgement and insight appear normal. Mood & affect appropriate.     Data Reviewed:   I have personally reviewed following labs and imaging studies   CBC: Recent Labs  Lab 04/09/22 0540 04/09/22 0630 04/10/22 0030  WBC  --  8.3 9.7  NEUTROABS  --  6.2  --   HGB 13.6 13.3 13.7  HCT 40.0 39.5 40.4  MCV  --  87.4 86.3  PLT  --  255 123XX123    Basic Metabolic Panel: Recent Labs  Lab 04/09/22 0540 04/09/22 0630  NA 139 138  K 3.4* 3.3*  CL 102 102  CO2  --  23  GLUCOSE 152* 152*  BUN 34* 33*  CREATININE 1.20 1.27*  CALCIUM  --  9.0    Liver Function Tests: Recent Labs  Lab 04/09/22 0630  AST 23  ALT 15  ALKPHOS 41  BILITOT 0.6  PROT 6.8  ALBUMIN 3.5    CBG: Recent Labs  Lab 04/09/22 1726  GLUCAP 119*    Microbiology Studies:  No results found for this or any previous visit (from the past 240 hour(s)).  Radiology Studies:  MR BRAIN WO CONTRAST  Result Date: 04/09/2022 CLINICAL DATA:  77 year old [name] with acute neurologic deficit primarily  abnormal speech, now clinically resolved. Chronic left MCA and cerebellar infarcts. EXAM: MRI HEAD WITHOUT CONTRAST TECHNIQUE: Multiplanar, multiecho pulse sequences of the brain and surrounding structures were obtained without intravenous contrast. COMPARISON:  Head CT earlier today.  Brain MRI 01/25/2022. FINDINGS: Brain: Small area of left frontal operculum restricted diffusion superimposed on underlying chronic left middle MCA territory encephalomalacia. See series 5, image 86 and series 8, image 22. No significant cytotoxic edema. Adjacent encephalomalacia and laminar necrosis with mild if any hemosiderin. Intrinsic T1 hyperintensity has developed throughout the left lentiform, which appears stable on T2 and FLAIR. Questionable increased mineralization on SWI. No other restricted diffusion. Stable chronic bilateral cerebellar infarcts. Stable gray and white matter signal otherwise. Small chronic microhemorrhages in the periventricular white matter at the left frontal horn, right occipital horn are stable on SWI. No midline shift, mass effect, evidence of mass lesion, ventriculomegaly, extra-axial collection or acute intracranial hemorrhage. Cervicomedullary junction and pituitary are within normal limits. Vascular: Major intracranial vascular flow voids are stable  since last year. Skull and upper cervical spine: Stable visible cervical spine with chronic ligamentous hypertrophy about the odontoid. Visualized bone marrow signal is within normal limits. Sinuses/Orbits: Stable and negative orbits. Stable mild paranasal sinus mucosal thickening. Other: Mastoids remain clear. Grossly normal visible internal auditory structures. IMPRESSION: 1. Positive for a tiny acute cortical infarct of the left frontal operculum, within an area of chronic Left MCA middle division infarct with encephalomalacia and laminar necrosis. No acute hemorrhage or mass effect. 2. Conspicuous increased intrinsic T1 signal in the left  lentiform nuclei since December. Nonspecific, but favor this is a form of Wallerian degeneration secondary to the chronic ischemia in #1. 3. Elsewhere stable noncontrast MRI appearance of the brain since last year., including chronic bilateral cerebellar infarcts and occasional punctate chronic microhemorrhages. Electronically Signed   By: Genevie Ann M.D.   On: 04/09/2022 08:58   CT HEAD WO CONTRAST  Result Date: 04/09/2022 CLINICAL DATA:  77 year old [name] with acute neurologic deficit. Left MCA middle division infarct last year. EXAM: CT HEAD WITHOUT CONTRAST TECHNIQUE: Contiguous axial images were obtained from the base of the skull through the vertex without intravenous contrast. RADIATION DOSE REDUCTION: This exam was performed according to the departmental dose-optimization program which includes automated exposure control, adjustment of the mA and/or kV according to patient size and/or use of iterative reconstruction technique. COMPARISON:  Brain MRI 06/25/2021, CT head and CTA 06/24/2021. FINDINGS: Brain: Encephalomalacia corresponding to previous left MCA middle division infarct. Stable cerebral volume. No midline shift, ventriculomegaly, mass effect, evidence of mass lesion, intracranial hemorrhage or evidence of cortically based acute infarction. Small chronic bilateral cerebellar infarcts are stable from the MRI last year. Vascular: Severe Calcified atherosclerosis at the skull base. No suspicious intracranial vascular hyperdensity. Skull: No acute osseous abnormality identified. Partially visible chronic dental periapical lucency. Sinuses/Orbits: Generally mild paranasal sinus mucosal thickening is stable. Tympanic cavities and mastoids remain aerated. Other: Visualized orbits and scalp soft tissues are within normal limits. IMPRESSION: 1. No acute intracranial abnormality. 2. Chronic left MCA and cerebellar infarcts. Electronically Signed   By: Genevie Ann M.D.   On: 04/09/2022 07:26    Scheduled Meds:      stroke: early stages of recovery book   Does not apply Once    stroke: early stages of recovery book   Does not apply Once   amLODipine  5 mg Oral q AM   clopidogrel  75 mg Oral Daily   dapagliflozin propanediol  10 mg Oral QAC breakfast   fenofibrate  160 mg Oral Daily   furosemide  20 mg Oral q AM   insulin aspart  14 Units Subcutaneous Q0600   insulin aspart  16 Units Subcutaneous Q breakfast   insulin aspart  16 Units Subcutaneous Q2000   insulin glargine-yfgn  22 Units Subcutaneous QHS   isosorbide mononitrate  30 mg Oral Daily   metoprolol succinate  25 mg Oral Daily   rosuvastatin  40 mg Oral QHS   tamsulosin  0.4 mg Oral Daily   Warfarin - Pharmacist Dosing Inpatient   Does not apply q1600    Continuous Infusions:    sodium chloride 50 mL/hr at 04/09/22 2007   heparin 1,300 Units/hr (04/10/22 0717)     LOS: 0 days     Vernell Leep, MD,  FACP, [facility], [facility], [facility], [facility]   [facility] [place]     To contact the attending provider between 7A-7P or the covering provider during after hours  7P-7A, please log into the web site [email or web address] and access using universal  password for that web site. If you do not have the password, please call the hospital operator.  04/10/2022, 7:43 AM

## 2022-04-10 NOTE — Plan of Care (Signed)

## 2022-04-10 NOTE — Progress Notes (Signed)
  Echocardiogram 2D Echocardiogram has been performed.  Eartha Inch 04/10/2022, 4:25 PM

## 2022-04-10 NOTE — ED Notes (Signed)
Pt/Ot at bedside

## 2022-04-10 NOTE — Evaluation (Signed)
Physical Therapy Evaluation Patient Details Name: [name] MRN: [id number] DOB: [birth year] Today's Date: 04/10/2022  History of Present Illness  77 y.o. male presents to [facility] hospital on 04/09/2022 with acute word finding difficulties. MRI shows L frontal operculum infarct, adjacent to prior L MCA infarct. PMH includes CAD s/p CABG, aortic valve replacement, PAF, HLD, HTN.  Clinical Impression  Pt presents to PT with deficits in RLE strength, however these appear to be related to prior CVA without acute exacerbation at this time. Pt is able to mobilize independently at this time, reports ongoing difficulty with stair negotiation but does not have to manage stairs within his home. Pt expresses the desire to continue ongoing HHPT once discharged from the hospital. Acute PT will continue to follow until discharge is complete.       Recommendations for follow up therapy are one component of a multi-disciplinary discharge planning process, led by the attending physician.  Recommendations may be updated based on patient status, additional functional criteria and insurance authorization.  Follow Up Recommendations Home health PT (pt would prefer to continue ongoing HHPT)      Assistance Recommended at Discharge PRN  Patient can return home with the following  Help with stairs or ramp for entrance    Equipment Recommendations None recommended by PT  Recommendations for Other Services       Functional Status Assessment Patient has not had a recent decline in their functional status (appears to be near recent baseline)     Precautions / Restrictions Precautions Precautions: Fall Restrictions Weight Bearing Restrictions: No      Mobility  Bed Mobility Overal bed mobility: Modified Independent Bed Mobility: Supine to Sit, Sit to Supine     Supine to sit: Modified independent (Device/Increase time) Sit to supine: Modified independent (Device/Increase time)         Transfers Overall transfer level: Independent Equipment used: None                    Ambulation/Gait Ambulation/Gait assistance: Modified independent (Device/Increase time) Gait Distance (Feet): 600 Feet Assistive device: None Gait Pattern/deviations: Step-through pattern Gait velocity: functional Gait velocity interpretation: >2.62 ft/sec, indicative of community ambulatory   General Gait Details: pt tolerates multiple dynamic gait challenges including head turns, changes in direction and stride length, backward walking, all without LOB noted  Stairs            Wheelchair Mobility    Modified Rankin (Stroke Patients Only)       Balance Overall balance assessment: No apparent balance deficits (not formally assessed)                                           Pertinent Vitals/Pain Pain Assessment Pain Assessment: No/denies pain    Home Living Family/patient expects to be discharged to:: Private residence Living Arrangements: Spouse/significant other Available Help at Discharge: Family;Available 24 hours/day Type of Home: House Home Access: Level entry       Home Layout: One level Home Equipment: Rollator (4 wheels);Cane - single point;Toilet riser;Grab bars - toilet      Prior Function Prior Level of Function : Independent/Modified Independent;Driving             Mobility Comments: rollator for longer community distances, SPC in the home intermittently ADLs Comments: indep     Hand Dominance   Dominant Hand: Right  Extremity/Trunk Assessment   Upper Extremity Assessment Upper Extremity Assessment: Overall WFL for tasks assessed    Lower Extremity Assessment Lower Extremity Assessment: RLE deficits/detail RLE Deficits / Details: pt reports subjective RLE weakness, specifically around knee joint. 4+/5 noted on assessment    Cervical / Trunk Assessment Cervical / Trunk Assessment: Normal  Communication    Communication: HOH  Cognition Arousal/Alertness: Awake/alert Behavior During Therapy: WFL for tasks assessed/performed Overall Cognitive Status: Within Functional Limits for tasks assessed                                 General Comments: would anticipate cognition is at baseline        General Comments General comments (skin integrity, edema, etc.): VSS on RA    Exercises     Assessment/Plan    PT Assessment Patient needs continued PT services  PT Problem List Decreased strength       PT Treatment Interventions Therapeutic activities;Therapeutic exercise;Balance training;Neuromuscular re-education;Patient/family education    PT Goals (Current goals can be found in the Care Plan section)  Acute Rehab PT Goals Patient Stated Goal: to go home and continue therapies PT Goal Formulation: With patient Time For Goal Achievement: 04/24/22 Potential to Achieve Goals: Good Additional Goals Additional Goal #1: Pt will score >45/56 on the BERG balance test to indicate a reduced risk for falls Additional Goal #2: Pt will score >19/24 on the DGI to indicate a reduced risk for falls    Frequency Min 1X/week     Co-evaluation               AM-PAC PT "6 Clicks" Mobility  Outcome Measure Help needed turning from your back to your side while in a flat bed without using bedrails?: None Help needed moving from lying on your back to sitting on the side of a flat bed without using bedrails?: None Help needed moving to and from a bed to a chair (including a wheelchair)?: None Help needed standing up from a chair using your arms (e.g., wheelchair or bedside chair)?: None Help needed to walk in hospital room?: None Help needed climbing 3-5 steps with a railing? : A Little 6 Click Score: 23    End of Session   Activity Tolerance: Patient tolerated treatment well Patient left: in bed;with call bell/phone within reach Nurse Communication: Mobility status PT Visit  Diagnosis: Muscle weakness (generalized) (M62.81)    Time: [id number] PT Time Calculation (min) (ACUTE ONLY): 17 min   Charges:   PT Evaluation $PT Eval Low Complexity: [place], PT, DPT Acute Rehabilitation Office [phone]   Zenaida Niece 04/10/2022, 10:00 AM

## 2022-04-10 NOTE — ED Notes (Signed)
ED TO INPATIENT HANDOFF REPORT  ED Nurse Name and Phone #: [phone]  S Name/Age/Gender [name] 77 y.o. male Room/Bed: 038C/038C  Code Status   Code Status: Full Code  Home/SNF/Other Home Patient oriented to: self, place, time, and situation Is this baseline? Yes   Triage Complete: Triage complete  Chief Complaint Acute CVA (cerebrovascular accident) [facility]) [I63.9]  Triage Note Pt BIB EMS from home with c/o slurred speech and difficulty getting words out. Per wife, when waking up this morning she asked spouse question and he had difficulty answering and speech sounded slurred. Hx of stroke with no deficits. Pt back to baseline by the time EMS arrived on scene. GCS 15. Equal grip strength. No facial droop or slurred speech on arrival.   Allergies No Known Allergies  Level of Care/Admitting Diagnosis ED Disposition     ED Disposition  Admit   Condition  --   Comment  Hospital Area: [place] [[id number]]  Level of Care: Telemetry Medical [[id number]]  May place patient in observation at [facility] or [facility] if equivalent level of care is available:: No  Covid Evaluation: Asymptomatic - no recent exposure (last 10 days) testing not required  Diagnosis: [id number]  Admitting Physician: Neena Rhymes [[id number]]  Attending Physician: Neena Rhymes [[id number]]          B Medical/Surgery History Past Medical History:  Diagnosis Date   Actinic keratosis 02/14/2011   Allergy    Aneurysm of ascending aorta without rupture ([facility]) 01/06/2022   Aortic stenosis    Arthritis    Bilateral sensorineural hearing loss 06/16/2009   CAD (coronary artery disease)    cabg   Complication of anesthesia    Coronary atherosclerosis of native coronary artery 06/15/2009   Diabetic polyneuropathy 11/11/2018   Dyslipidemia [id number]   Embolic stroke involving left middle cerebral artery  05/30/2021   Erectile dysfunction  05/18/2009   Essential hypertension 06/16/2009   Expressive aphasia 06/25/2021   External hemorrhoids 03/21/2010   Gout 04/09/2015   Heart murmur    Hemochromatosis 02/14/2011   History of aortic valve replacement 10/13/2009   History of CABG 11/05/2018   2005- 1. Coronary artery bypass grafting x4 with LIMA-LAD, SVG-OM1, SVG-AM and dCFX.  2. Aortic valve replacement with St Jude AVR 3. Septal myomectomy.  4. Myoview low risk 2016   History of hepatitis 01/31/1972   Hypertriglyceridemia 06/13/2010   Hypertrophic obstructive cardiomyopathy 05/06/2008   SP septal myomectomy at surgery 2005 Echo Nov 2018- Hyperdynamic LVEF with severe basal septal hypertrophy. There is chordal SAM with resting gradient of 16 mmHg that increases to 85 mmHg with Valsalva        Leg weakness, bilateral 01/18/2021   Long term (current) use of anticoagulants 05/22/2018   Lumbar stenosis with neurogenic claudication 02/04/2021   Malignant neoplasm of prostate 06/17/2009   Mechanical heart valve present    Manufacturer: Sherlean Foot #: [id number]  Model #: [id number]. Card states MRI compatible with 3 teslas or less.   Obesity, unspecified 04/29/2008   OSA (obstructive sleep apnea) 07/12/2012   moderate-severe; uses CPAP nightly   PONV (postoperative nausea and vomiting)    after CABG- slow to wake up   Presence of prosthetic heart valve    Primary osteoarthritis of left shoulder 10/31/2018   S/P lumbar laminectomy 02/04/2021   Stage 3 chronic kidney disease due to type 2 diabetes mellitus 11/11/2018   Tobacco use 06/16/2009  Type 2 diabetes mellitus with hyperglycemia    Ventral hernia 03/03/2014   Past Surgical History:  Procedure Laterality Date   AORTIC VALVE REPLACEMENT  11/14/2003   St Jude [facility]   CORONARY ANGIOGRAPHY  10/11/2021   CORONARY ARTERY BYPASS GRAFT  10/2003   HERNIA REPAIR  1999   right, inguinal   HERNIA REPAIR  2002   left,  inguinal   HIP SURGERY  2006   right hip   IR CT HEAD LTD  02/03/2022   IR PERCUTANEOUS ART THROMBECTOMY/INFUSION INTRACRANIAL INC DIAG ANGIO  01/24/2022   LUMBAR LAMINECTOMY/DECOMPRESSION MICRODISCECTOMY Bilateral 02/04/2021   Procedure: Bilateral Lumbar Two-Three Laminectomy;  Surgeon: Kristeen Miss, MD;  Location: [place];  Service: Neurosurgery;  Laterality: Bilateral;  3C/RM 20   PILONIDAL CYST EXCISION  1964   prostate seed implant  03/2010   RADIOLOGY WITH ANESTHESIA N/A 01/24/2022   Procedure: IR WITH ANESTHESIA;  Surgeon: Radiologist, Medication, MD;  Location: [place];  Service: Radiology;  Laterality: N/A;   TEE WITHOUT CARDIOVERSION N/A 03/14/2022   Procedure: TRANSESOPHAGEAL ECHOCARDIOGRAM (TEE);  Surgeon: [name], MD;  Location: [facility];  Service: Cardiology;  Laterality: N/A;   TONSILLECTOMY  childhood     A IV Location/Drains/Wounds Patient Lines/Drains/Airways Status     Active Line/Drains/Airways     Name Placement date Placement time Site Days   Peripheral IV 04/10/22 20 G Posterior;Right Hand 04/10/22  0924  Hand  less than 1   Urethral Catheter --  --  --  --            Intake/Output Last 24 hours  Intake/Output Summary (Last 24 hours) at 04/10/2022 1109 Last data filed at 04/10/2022 1027 Gross per 24 hour  Intake 164.6 ml  Output 4650 ml  Net -4485.4 ml    Labs/Imaging Results for orders placed or performed during the hospital encounter of 04/09/22 (from the past 48 hour(s))  I-stat chem 8, ED     Status: Abnormal   Collection Time: 04/09/22  5:40 AM  Result Value Ref Range   Sodium 139 135 - 145 mmol/L   Potassium 3.4 (L) 3.5 - 5.1 mmol/L   Chloride 102 98 - 111 mmol/L   BUN 34 (H) 8 - 23 mg/dL   Creatinine, Ser 1.20 0.61 - 1.24 mg/dL   Glucose, Bld 152 (H) 70 - 99 mg/dL    Comment: Glucose reference range applies only to samples taken after fasting for at least 8 hours.   Calcium, Ion 1.13 (L) 1.15 - 1.40 mmol/L   TCO2 26 22 - 32  mmol/L   Hemoglobin 13.6 13.0 - 17.0 g/dL   HCT 40.0 39.0 - 52.0 %  Ethanol     Status: None   Collection Time: 04/09/22  6:30 AM  Result Value Ref Range   Alcohol, Ethyl (B) <10 <10 mg/dL    Comment: (NOTE) Lowest detectable limit for serum alcohol is 10 mg/dL.  For medical purposes only. Performed at [facility], [place] [address], [place], [place] [address]   Protime-INR     Status: Abnormal   Collection Time: 04/09/22  6:30 AM  Result Value Ref Range   Prothrombin Time 24.6 (H) 11.4 - 15.2 seconds   INR 2.2 (H) 0.8 - 1.2    Comment: (NOTE) INR goal varies based on device and disease states. Performed at [facility], [place] [address] [place], [place] [address]  APTT     Status: None   Collection Time: 04/09/22  6:30 AM  Result Value Ref Range   aPTT 31 24 - 36 seconds    Comment: Performed at [facility], [place] [address] [place], [place] [address]  CBC     Status: None   Collection Time: 04/09/22  6:30 AM  Result Value Ref Range   WBC 8.3 4.0 - 10.5 K/uL   RBC 4.52 4.22 - 5.81 MIL/uL   Hemoglobin 13.3 13.0 - 17.0 g/dL   HCT 39.5 39.0 - 52.0 %   MCV 87.4 80.0 - 100.0 fL   MCH 29.4 26.0 - 34.0 pg   MCHC 33.7 30.0 - 36.0 g/dL   RDW 14.0 11.5 - 15.5 %   Platelets 255 150 - 400 K/uL   nRBC 0.0 0.0 - 0.2 %    Comment: Performed at [facility], [place] [address] [place], [place] [address]  Differential     Status: None   Collection Time: 04/09/22  6:30 AM  Result Value Ref Range   Neutrophils Relative % 74 %   Neutro Abs 6.2 1.7 - 7.7 K/uL   Lymphocytes Relative 14 %   Lymphs Abs 1.1 0.7 - 4.0 K/uL   Monocytes Relative 8 %   Monocytes Absolute 0.6 0.1 - 1.0 K/uL   Eosinophils Relative 2 %   Eosinophils Absolute 0.2 0.0 - 0.5 K/uL   Basophils Relative 1 %   Basophils Absolute 0.1 0.0 - 0.1 K/uL   Immature Granulocytes 1 %   Abs Immature Granulocytes 0.04 0.00 - 0.07 K/uL    Comment: Performed at [facility] [address],  [place], [place] [address]  Comprehensive metabolic panel     Status: Abnormal   Collection Time: 04/09/22  6:30 AM  Result Value Ref Range   Sodium 138 135 - 145 mmol/L   Potassium 3.3 (L) 3.5 - 5.1 mmol/L   Chloride 102 98 - 111 mmol/L   CO2 23 22 - 32 mmol/L   Glucose, Bld 152 (H) 70 - 99 mg/dL    Comment: Glucose reference range applies only to samples taken after fasting for at least 8 hours.   BUN 33 (H) 8 - 23 mg/dL   Creatinine, Ser 1.27 (H) 0.61 - 1.24 mg/dL   Calcium 9.0 8.9 - 10.3 mg/dL   Total Protein 6.8 6.5 - 8.1 g/dL   Albumin 3.5 3.5 - 5.0 g/dL   AST 23 15 - 41 U/L   ALT 15 0 - 44 U/L   Alkaline Phosphatase 41 38 - 126 U/L   Total Bilirubin 0.6 0.3 - 1.2 mg/dL   GFR, Estimated 58 (L) >60 mL/min    Comment: (NOTE) Calculated using the CKD-EPI Creatinine Equation (2021)    Anion gap 13 5 - 15    Comment: Performed at [facility] [address] [place], [place] [address]  Urine rapid drug screen (hosp performed)     Status: None   Collection Time: 04/09/22  7:53 AM  Result Value Ref Range   Opiates NONE DETECTED NONE DETECTED   Cocaine NONE DETECTED NONE DETECTED   Benzodiazepines NONE DETECTED NONE DETECTED   Amphetamines NONE DETECTED NONE DETECTED   Tetrahydrocannabinol NONE DETECTED NONE DETECTED   Barbiturates NONE DETECTED NONE DETECTED    Comment: (NOTE) DRUG SCREEN FOR MEDICAL PURPOSES ONLY.  IF CONFIRMATION IS NEEDED FOR ANY PURPOSE, NOTIFY LAB WITHIN 5 DAYS.  LOWEST DETECTABLE LIMITS FOR  URINE DRUG SCREEN Drug Class                     Cutoff (ng/mL) Amphetamine and metabolites    1000 Barbiturate and metabolites    200 Benzodiazepine                 200 Opiates and metabolites        300 Cocaine and metabolites        300 THC                            50 Performed at [facility], [place] [address] [place], [place] [address]   Urinalysis, Routine w reflex microscopic -Urine, Clean Catch     Status: Abnormal   Collection Time:  04/09/22  7:53 AM  Result Value Ref Range   Color, Urine YELLOW YELLOW   APPearance HAZY (A) CLEAR   Specific Gravity, Urine 1.014 1.005 - 1.030   pH 6.0 5.0 - 8.0   Glucose, UA >=500 (A) NEGATIVE mg/dL   Hgb urine dipstick LARGE (A) NEGATIVE   Bilirubin Urine NEGATIVE NEGATIVE   Ketones, ur NEGATIVE NEGATIVE mg/dL   Protein, ur 30 (A) NEGATIVE mg/dL   Nitrite NEGATIVE NEGATIVE   Leukocytes,Ua LARGE (A) NEGATIVE   RBC / HPF 6-10 0 - 5 RBC/hpf   WBC, UA >50 0 - 5 WBC/hpf   Bacteria, UA RARE (A) NONE SEEN   Squamous Epithelial / HPF 0-5 0 - 5 /HPF   Mucus PRESENT     Comment: Performed at [facility], [address], [place], [place] [address]  CBG monitoring, ED     Status: Abnormal   Collection Time: 04/09/22  5:26 PM  Result Value Ref Range   Glucose-Capillary 119 (H) 70 - 99 mg/dL    Comment: Glucose reference range applies only to samples taken after fasting for at least 8 hours.  Lipid panel     Status: Abnormal   Collection Time: 04/10/22 12:30 AM  Result Value Ref Range   Cholesterol 136 0 - 200 mg/dL   Triglycerides 252 (H) <150 mg/dL   HDL 30 (L) >40 mg/dL   Total CHOL/HDL Ratio 4.5 RATIO   VLDL 50 (H) 0 - 40 mg/dL   LDL Cholesterol 56 0 - 99 mg/dL    Comment:        Total Cholesterol/HDL:CHD Risk Coronary Heart Disease Risk Table                     Men   Women  1/2 Average Risk   3.4   3.3  Average Risk       5.0   4.4  2 X Average Risk   9.6   7.1  3 X Average Risk  23.4   11.0        Use the calculated Patient Ratio above and the CHD Risk Table to determine the patient's CHD Risk.        ATP III CLASSIFICATION (LDL):  <100     mg/dL   Optimal  100-129  mg/dL   Near or Above                    Optimal  130-159  mg/dL   Borderline  160-189  mg/dL   High  >190     mg/dL   Very High Performed at [facility],  [address], [place], [place] [address]   Heparin level (unfractionated)     Status: Abnormal   Collection Time: 04/10/22 12:30 AM   Result Value Ref Range   Heparin Unfractionated <0.10 (L) 0.30 - 0.70 IU/mL    Comment: (NOTE) The clinical reportable range upper limit is being lowered to >1.10 to align with the FDA approved guidance for the current laboratory assay.  If heparin results are below expected values, and patient dosage has  been confirmed, suggest follow up testing of antithrombin III levels. Performed at [facility], [place] [address], [place] [address]   CBC     Status: None   Collection Time: 04/10/22 12:30 AM  Result Value Ref Range   WBC 9.7 4.0 - 10.5 K/uL   RBC 4.68 4.22 - 5.81 MIL/uL   Hemoglobin 13.7 13.0 - 17.0 g/dL   HCT 40.4 39.0 - 52.0 %   MCV 86.3 80.0 - 100.0 fL   MCH 29.3 26.0 - 34.0 pg   MCHC 33.9 30.0 - 36.0 g/dL   RDW 14.0 11.5 - 15.5 %   Platelets 279 150 - 400 K/uL   nRBC 0.0 0.0 - 0.2 %    Comment: Performed at [facility], [place] [address], [place], [place] [address]  Protime-INR     Status: Abnormal   Collection Time: 04/10/22 12:30 AM  Result Value Ref Range   Prothrombin Time 24.0 (H) 11.4 - 15.2 seconds   INR 2.2 (H) 0.8 - 1.2    Comment: (NOTE) INR goal varies based on device and disease states. Performed at [facility], [place] [address] [place], [place] [address]   CBG monitoring, ED     Status: Abnormal   Collection Time: 04/10/22  8:02 AM  Result Value Ref Range   Glucose-Capillary 161 (H) 70 - 99 mg/dL    Comment: Glucose reference range applies only to samples taken after fasting for at least 8 hours.  Heparin level (unfractionated)     Status: Abnormal   Collection Time: 04/10/22  9:39 AM  Result Value Ref Range   Heparin Unfractionated >1.10 (H) 0.30 - 0.70 IU/mL    Comment: (NOTE) The clinical reportable range upper limit is being lowered to >1.10 to align with the FDA approved guidance for the current laboratory assay.  If heparin results are below expected values, and patient dosage has  been confirmed, suggest follow up  testing of antithrombin III levels. Performed at [facility], [place] [address], [place], [place] [address]    *Note: Due to a large number of results and/or encounters for the requested time period, some results have not been displayed. A complete set of results can be found in Results Review.   MR BRAIN WO CONTRAST  Result Date: 04/09/2022 CLINICAL DATA:  77 year old male with acute neurologic deficit primarily abnormal speech, now clinically resolved. Chronic left MCA and cerebellar infarcts. EXAM: MRI HEAD WITHOUT CONTRAST TECHNIQUE: Multiplanar, multiecho pulse sequences of the brain and surrounding structures were obtained without intravenous contrast. COMPARISON:  Head CT earlier today.  Brain MRI 01/25/2022. FINDINGS: Brain: Small area of left frontal operculum restricted diffusion superimposed on underlying chronic left middle MCA territory encephalomalacia. See series 5, image 86 and series 8, image 22. No significant cytotoxic edema. Adjacent encephalomalacia and laminar necrosis with mild if any hemosiderin. Intrinsic T1 hyperintensity has developed throughout the left lentiform, which appears stable on T2 and FLAIR. Questionable increased mineralization on SWI. No  other restricted diffusion. Stable chronic bilateral cerebellar infarcts. Stable gray and white matter signal otherwise. Small chronic microhemorrhages in the periventricular white matter at the left frontal horn, right occipital horn are stable on SWI. No midline shift, mass effect, evidence of mass lesion, ventriculomegaly, extra-axial collection or acute intracranial hemorrhage. Cervicomedullary junction and pituitary are within normal limits. Vascular: Major intracranial vascular flow voids are stable since last year. Skull and upper cervical spine: Stable visible cervical spine with chronic ligamentous hypertrophy about the odontoid. Visualized bone marrow signal is within normal limits. Sinuses/Orbits: Stable and negative  orbits. Stable mild paranasal sinus mucosal thickening. Other: Mastoids remain clear. Grossly normal visible internal auditory structures. IMPRESSION: 1. Positive for a tiny acute cortical infarct of the left frontal operculum, within an area of chronic Left MCA middle division infarct with encephalomalacia and laminar necrosis. No acute hemorrhage or mass effect. 2. Conspicuous increased intrinsic T1 signal in the left lentiform nuclei since December. Nonspecific, but favor this is a form of Wallerian degeneration secondary to the chronic ischemia in #1. 3. Elsewhere stable noncontrast MRI appearance of the brain since last year., including chronic bilateral cerebellar infarcts and occasional punctate chronic microhemorrhages. Electronically Signed   By: Genevie Ann M.D.   On: 04/09/2022 08:58   CT HEAD WO CONTRAST  Result Date: 04/09/2022 CLINICAL DATA:  77 year old male with acute neurologic deficit. Left MCA middle division infarct last year. EXAM: CT HEAD WITHOUT CONTRAST TECHNIQUE: Contiguous axial images were obtained from the base of the skull through the vertex without intravenous contrast. RADIATION DOSE REDUCTION: This exam was performed according to the departmental dose-optimization program which includes automated exposure control, adjustment of the mA and/or kV according to patient size and/or use of iterative reconstruction technique. COMPARISON:  Brain MRI 06/25/2021, CT head and CTA 06/24/2021. FINDINGS: Brain: Encephalomalacia corresponding to previous left MCA middle division infarct. Stable cerebral volume. No midline shift, ventriculomegaly, mass effect, evidence of mass lesion, intracranial hemorrhage or evidence of cortically based acute infarction. Small chronic bilateral cerebellar infarcts are stable from the MRI last year. Vascular: Severe Calcified atherosclerosis at the skull base. No suspicious intracranial vascular hyperdensity. Skull: No acute osseous abnormality identified.  Partially visible chronic dental periapical lucency. Sinuses/Orbits: Generally mild paranasal sinus mucosal thickening is stable. Tympanic cavities and mastoids remain aerated. Other: Visualized orbits and scalp soft tissues are within normal limits. IMPRESSION: 1. No acute intracranial abnormality. 2. Chronic left MCA and cerebellar infarcts. Electronically Signed   By: Genevie Ann M.D.   On: 04/09/2022 07:26    Pending Labs Unresulted Labs (From admission, onward)     Start     Ordered   04/11/22 [id number]  Basic metabolic panel  Tomorrow morning,   R        04/10/22 0805   04/11/22 0500  Magnesium  Tomorrow morning,   R        04/10/22 0805   04/10/22 1053  Heparin level (unfractionated)  ONCE - URGENT,   URGENT        04/10/22 1052   04/10/22 0808  Hemoglobin A1c  Add-on,   AD        04/10/22 0807   04/10/22 0500  Heparin level (unfractionated)  Daily,   R     See Hyperspace for full Linked Orders Report.   04/09/22 1838   04/10/22 0500  CBC  Daily,   R     See Hyperspace for full Linked Orders Report.   04/09/22 1838   04/10/22  0500  Protime-INR  Daily,   R      04/09/22 1852            Vitals/Pain Today's Vitals   04/10/22 0600 04/10/22 0700 04/10/22 0800 04/10/22 0903  BP: 135/80 (!) 147/70 (!) 155/65   Pulse: (!) 58 61 69   Resp: '16 20 20   '$ Temp:    98.3 F (36.8 C)  TempSrc:    Oral  SpO2: 91% 95% 96%   Weight:      Height:      PainSc:        Isolation Precautions No active isolations  Medications Medications   stroke: early stages of recovery book ( Does not apply Not Given 04/10/22 0838)  Warfarin - Pharmacist Dosing Inpatient (has no administration in time range)  heparin ADULT infusion 100 units/mL (25000 units/257m) (1,300 Units/hr Intravenous Infusion Verify 04/10/22 0838)  0.9 %  sodium chloride infusion ( Intravenous New Bag/Given 04/09/22 2007)  acetaminophen (TYLENOL) tablet 650 mg (has no administration in time range)    Or  acetaminophen (TYLENOL) 160  MG/5ML solution 650 mg (has no administration in time range)    Or  acetaminophen (TYLENOL) suppository 650 mg (has no administration in time range)  senna-docusate (Senokot-S) tablet 1 tablet (has no administration in time range)  amLODipine (NORVASC) tablet 5 mg (5 mg Oral Given 04/10/22 0833)  fenofibrate tablet 160 mg (160 mg Oral Given 04/10/22 0946)  furosemide (LASIX) tablet 20 mg (20 mg Oral Given 04/10/22 0832)  isosorbide mononitrate (IMDUR) 24 hr tablet 30 mg (30 mg Oral Given 04/10/22 0832)  metoprolol succinate (TOPROL-XL) 24 hr tablet 25 mg (25 mg Oral Given 04/10/22 0833)  rosuvastatin (CRESTOR) tablet 40 mg (40 mg Oral Given 04/10/22 0025)  dapagliflozin propanediol (FARXIGA) tablet 10 mg (10 mg Oral Given 04/10/22 0833)  tamsulosin (FLOMAX) capsule 0.4 mg (0.4 mg Oral Given 04/10/22 0839)  clopidogrel (PLAVIX) tablet 75 mg (75 mg Oral Given 04/10/22 0833)  triamcinolone cream (KENALOG) 0.5 % (has no administration in time range)  insulin aspart (novoLOG) injection 16 Units (16 Units Subcutaneous Given 04/10/22 0834)  insulin aspart (novoLOG) injection 14 Units (has no administration in time range)  insulin aspart (novoLOG) injection 16 Units (16 Units Subcutaneous Not Given 04/09/22 2013)  insulin glargine-yfgn (SEMGLEE) injection 22 Units (22 Units Subcutaneous Not Given 04/09/22 2348)  warfarin (COUMADIN) tablet 7.5 mg (has no administration in time range)  warfarin (COUMADIN) tablet 7.5 mg (7.5 mg Oral Given 04/09/22 2007)   stroke: early stages of recovery book ( Does not apply Given 04/10/22 0838)  potassium chloride SA (KLOR-CON M) CR tablet 40 mEq (40 mEq Oral Given 04/10/22 0636)    Mobility walks     Focused Assessments Cardiac Assessment Handoff:  Cardiac Rhythm: Normal sinus rhythm Lab Results  Component Value Date   CKTOTAL 98 01/20/2021   No results found for: "DDIMER" Does the Patient currently have chest pain? No    R Recommendations: See Admitting Provider  Note  Report given to:   Additional Notes: mechanical valve

## 2022-04-11 DIAGNOSIS — I639 Cerebral infarction, unspecified: Secondary | ICD-10-CM | POA: Diagnosis not present

## 2022-04-11 DIAGNOSIS — R4701 Aphasia: Secondary | ICD-10-CM | POA: Diagnosis not present

## 2022-04-11 DIAGNOSIS — E876 Hypokalemia: Secondary | ICD-10-CM | POA: Diagnosis not present

## 2022-04-11 DIAGNOSIS — I6389 Other cerebral infarction: Secondary | ICD-10-CM

## 2022-04-11 LAB — BASIC METABOLIC PANEL
Anion gap: 9 (ref 5–15)
BUN: 27 mg/dL — ABNORMAL HIGH (ref 8–23)
CO2: 21 mmol/L — ABNORMAL LOW (ref 22–32)
Calcium: 8.5 mg/dL — ABNORMAL LOW (ref 8.9–10.3)
Chloride: 106 mmol/L (ref 98–111)
Creatinine, Ser: 1.41 mg/dL — ABNORMAL HIGH (ref 0.61–1.24)
GFR, Estimated: 51 mL/min — ABNORMAL LOW (ref >60)
Glucose, Bld: 114 mg/dL — ABNORMAL HIGH (ref 70–99)
Potassium: 3.3 mmol/L — ABNORMAL LOW (ref 3.5–5.1)
Sodium: 136 mmol/L (ref 135–145)

## 2022-04-11 LAB — CBC
HCT: 38.2 % — ABNORMAL LOW (ref 39.0–52.0)
Hemoglobin: 12.6 g/dL — ABNORMAL LOW (ref 13.0–17.0)
MCH: 28.7 pg (ref 26.0–34.0)
MCHC: 33 g/dL (ref 30.0–36.0)
MCV: 87 fL (ref 80.0–100.0)
Platelets: 232 10*3/uL (ref 150–400)
RBC: 4.39 MIL/uL (ref 4.22–5.81)
RDW: 13.9 % (ref 11.5–15.5)
WBC: 7.4 10*3/uL (ref 4.0–10.5)
nRBC: 0 % (ref 0.0–0.2)

## 2022-04-11 LAB — GLUCOSE, CAPILLARY
Glucose-Capillary: 142 mg/dL — ABNORMAL HIGH (ref 70–99)
Glucose-Capillary: 138 mg/dL — ABNORMAL HIGH (ref 70–99)
Glucose-Capillary: 155 mg/dL — ABNORMAL HIGH (ref 70–99)

## 2022-04-11 LAB — MAGNESIUM: Magnesium: 1.9 mg/dL (ref 1.7–2.4)

## 2022-04-11 LAB — PROTIME-INR
INR: 2.8 — ABNORMAL HIGH (ref 0.8–1.2)
INR: 2.7 — ABNORMAL HIGH (ref 0.8–1.2)
Prothrombin Time: 29.4 seconds — ABNORMAL HIGH (ref 11.4–15.2)
Prothrombin Time: 28.3 seconds — ABNORMAL HIGH (ref 11.4–15.2)

## 2022-04-11 LAB — HEPARIN LEVEL (UNFRACTIONATED): Heparin Unfractionated: 0.47 IU/mL (ref 0.30–0.70)

## 2022-04-11 MED ORDER — ASPIRIN 81 MG PO TBEC
81.0000 mg | DELAYED_RELEASE_TABLET | Freq: Every day | ORAL | Status: DC
  Administered 2022-04-11 – 2022-04-13 (×3): 81 mg via ORAL
  Filled 2022-04-11 (×3): qty 1

## 2022-04-11 MED ORDER — WARFARIN SODIUM 5 MG PO TABS: 7.5000 mg | ORAL_TABLET | Freq: Once | ORAL | Status: DC

## 2022-04-11 MED ORDER — POTASSIUM CHLORIDE CRYS ER 20 MEQ PO TBCR
40.0000 meq | EXTENDED_RELEASE_TABLET | Freq: Once | ORAL | Status: AC
  Administered 2022-04-11: 40 meq via ORAL
  Filled 2022-04-11: qty 2

## 2022-04-11 MED ORDER — WARFARIN SODIUM 5 MG PO TABS
7.5000 mg | ORAL_TABLET | Freq: Once | ORAL | Status: AC
  Administered 2022-04-11: 7.5 mg via ORAL
  Filled 2022-04-11: qty 1

## 2022-04-11 MED ORDER — WARFARIN SODIUM 2.5 MG PO TABS
3.7500 mg | ORAL_TABLET | Freq: Once | ORAL | Status: DC
  Filled 2022-04-11: qty 1

## 2022-04-11 NOTE — Progress Notes (Signed)
Pt refused CPAP for tonight.  

## 2022-04-11 NOTE — Progress Notes (Addendum)
PROGRESS NOTE   [name]  [id number]    DOB: 01/10/1946    DOA: 04/09/2022  PCP: [name], NP   I have briefly reviewed patients previous medical records in [facility].  Chief Complaint  Patient presents with   Aphasia    Brief Narrative:  77 year old married male, uses a walker or cane to ambulate for support, medical history significant for CAD s/p CABG, aortic valve replacement in 2011 on Coumadin anticoagulation, paroxysmal A-fib, hyperlipidemia, hypertension, prior multiple ischemic strokes-most recent 1 in May [id number], suspected embolic, OSA on CPAP, stage II CKD (not 3), urinary retention, former tobacco user, type II DM with neuropathy, hard of hearing, presented to the ED via EMS for acute onset of word finding difficulties that started on 3/10 approximately 5:15 AM, no other strokelike symptoms, lasted about 4 or 5 hours and then resolved spontaneously.  Reports similar speech issues during prior stroke in May which resolved over 5 to 7 days.  MRI brain confirmed tiny acute cortical infarct in the left frontal operculum.  Neurology consulted.  INR subtherapeutic at 2.2 for his AVR.  Currently on IV heparin drip.  TEE on [id number] without embolic source.  Stroke workup and therapies evaluation is completed.  DC home pending therapeutic INR 3 or greater, hopefully 3/13.   Assessment & Plan:  Active Problems:   Acute CVA (cerebrovascular accident) ([place])   History of aortic valve replacement   Long term (current) use of anticoagulants   Diabetes mellitus ([place])   Essential hypertension   Urinary retention   Acute ischemic stroke, suspecting embolic, with temporary expressive aphasia: CT head 3/10: No acute intracranial abnormalities.  Chronic left MCA and cerebellar infarcts.  MRI brain: Positive for a tiny acute cortical infarct of the left frontal operculum, within an area of chronic left MCA middle division infarct with encephalomalacia and laminar  necrosis.  No acute hemorrhage or mass effect.  Chronic bilateral cerebellar infarcts and occasional punctate chronic microhemorrhages.  LDL at goal, 56.  INR subtherapeutic, 2.2 on admission.  Currently on IV heparin bridging and Coumadin.  BAL <10.  UDS negative.  A1c in December 7.3. TTE: LVEF 60-65% 23 mm Saint Jude mechanical valve in aortic position, normal structure and function.  No evidence of endocarditis on limited TTE.  CUS: Bilateral 1-39% ICA stenosis.  TCD: Low normal velocities throughout with poor windows.  Therapies recommend home health PT, OT, SLP.  Spouse requests home health RN for INR checks if possible.  Was on Plavix and warfarin PTA.  As per extensive communication with Dr. Leonie Man, neurology and Dr. Debara Pickett, cardiology: Plavix discontinued.  Started aspirin 81 Mg daily.  Continue warfarin but with goal between 3-3.5.  Repeat INR this afternoon is 2.7, continue IV heparin bridge until INR therapeutic 3 or greater prior to discharge.  Outpatient follow-up with Coumadin clinic.  Outpatient neurology follow-up.  Urinary retention: States that he had a Foley catheter from 12/26 until Friday of last week when it was removed and has been doing in and out catheterizations at home.  Follows with Dr. Diona Fanti, [facility].  Continue In-N-Out catheterization.  Continue Flomax  Poorly controlled type II DM with hyperglycemia: Continue home dose of Lantus, meal time NovoLog and [place].  Monitor CBGs closely and adjust insulins as needed.  Repeat A1c: 7.3.  Good inpatient control.  Hypertension: Continue amlodipine, Imdur and Toprol-XL.  Controlled.  Hyperlipidemia:  LDL at goal.  Continue fenofibrate and rosuvastatin.  Hypokalemia: Continue to replace and  follow.  Magnesium 1.9.  Paroxysmal A-fib/mechanical AVR/secondary hypercoagulable state IV heparin bridge and Coumadin.  See anticoagulation discussion above.  Continue Toprol-XL.  Stage II chronic kidney disease Creatinine  slightly higher than baseline at 1.27 >1.41.  Follow BMP in AM.  If creatinine worsening, consider renal ultrasound.  Encouraged oral fluid intake. On gentle IVF  Body mass index is 31.75 kg/m./Obesity    DVT prophylaxis:   IV heparin   Code Status: Full Code:  ACP Documents: Reviewed living will and HCPOA Family Communication: Discussed with spouse via phone. Disposition:  Inpatient due to IV heparin need from recent acute stroke until INR 3 or greater.  Hopeful DC home tomorrow.     Consultants:   Neurology Cardiology  Procedures:     Antimicrobials:      Subjective:  Patient seen in the room along with his RN.  Reports that his speech is a lot better, but not back to baseline, may be 95% better.  Eager to go home.  Objective:   Vitals:   04/11/22 0015 04/11/22 0300 04/11/22 1100 04/11/22 1435  BP: (!) 160/74 (!) 146/71 122/68 116/64  Pulse: 60 60 73 70  Resp: '18 19 18 17  '$ Temp: 98.3 F (36.8 C) 98.2 F (36.8 C) 97.6 F (36.4 C)   TempSrc: Oral Oral Oral   SpO2: 97% 96%  97%  Weight:      Height:        General exam: Elderly male, moderately built and obese sitting up comfortable in chair without distress. Respiratory system: Clear to auscultation. Respiratory effort normal.  Midline sternotomy scar. Cardiovascular system: S1 & S2 heard, RRR. No JVD, murmurs, rubs, gallops or clicks. No pedal edema.  Telemetry personally reviewed: SB in the 50s-SR in the 60s. Gastrointestinal system: Abdomen is nondistended, soft and nontender. No organomegaly or masses felt. Normal bowel sounds heard. Central nervous system: Alert and oriented. No focal neurological deficits. ?? Mild expressive aphasia where he appears to struggle for words mildly. Extremities: Symmetric 5 x 5 power. Skin: No rashes, lesions or ulcers Psychiatry: Judgement and insight appear normal. Mood & affect appropriate.     Data Reviewed:   I have personally reviewed following labs and imaging  studies   CBC: Recent Labs  Lab 04/09/22 0630 04/10/22 0030 04/11/22 0341  WBC 8.3 9.7 7.4  NEUTROABS 6.2  --   --   HGB 13.3 13.7 12.6*  HCT 39.5 40.4 38.2*  MCV 87.4 86.3 87.0  PLT 255 279 [id number]    Basic Metabolic Panel: Recent Labs  Lab 04/09/22 0540 04/09/22 0630 04/11/22 0341  NA 139 138 136  K 3.4* 3.3* 3.3*  CL 102 102 106  CO2  --  23 21*  GLUCOSE 152* 152* 114*  BUN 34* 33* 27*  CREATININE 1.20 1.27* 1.41*  CALCIUM  --  9.0 8.5*  MG  --   --  1.9    Liver Function Tests: Recent Labs  Lab 04/09/22 0630  AST 23  ALT 15  ALKPHOS 41  BILITOT 0.6  PROT 6.8  ALBUMIN 3.5    CBG: Recent Labs  Lab 04/11/22 0813 04/11/22 1132 04/11/22 1529  GLUCAP 142* 138* 155*    Microbiology Studies:  No results found for this or any previous visit (from the past 240 hour(s)).  Radiology Studies:  VAS [name] TRANSCRANIAL DOPPLER  Result Date: 04/11/2022  Transcranial Doppler Patient Name:  [name]  Date of Exam:   04/10/2022 Medical Rec #: [id number]  Accession #:    [id number] Date of Birth: 17-Nov-1945         Patient Gender: M Patient Age:   39 years Exam Location:  [facility] Procedure:      VAS [name] TRANSCRANIAL DOPPLER Referring Phys: Elwin Sleight DE [place] TORRE --------------------------------------------------------------------------------  Indications: Stroke. History: Aortic valve replacement, on Coumadin. CAD, HTN, HLD, DM. Limitations: Body habitus. Comparison Study: No prior study Performing Technologist: Sharion Dove RVS  Examination Guidelines: A complete evaluation includes B-mode imaging, spectral Doppler, color Doppler, and power Doppler as needed of all accessible portions of each vessel. Bilateral testing is considered an integral part of a complete examination. Limited examinations for reoccurring indications may be performed as noted.  +----------+---------------+----------+-----------+------------------+ RIGHT TCD Right VM (cm/s)Depth  (cm)Pulsatility     Comment       +----------+---------------+----------+-----------+------------------+ MCA            39.00                  1.74                       +----------+---------------+----------+-----------+------------------+ ACA           -25.00                  1.16                       +----------+---------------+----------+-----------+------------------+ Term ICA       59.00                   94                        +----------+---------------+----------+-----------+------------------+ PCA P1         51.00                  1.31                       +----------+---------------+----------+-----------+------------------+ Opthalmic      17.00                  1.71                       +----------+---------------+----------+-----------+------------------+ ICA siphon                                    unable to insonate +----------+---------------+----------+-----------+------------------+ Vertebral     -18.00                  0.93                       +----------+---------------+----------+-----------+------------------+  +----------+--------------+----------+-----------+------------------+ LEFT TCD  Left VM (cm/s)Depth (cm)Pulsatility     Comment       +----------+--------------+----------+-----------+------------------+ MCA           24.00                  0.99                       +----------+--------------+----------+-----------+------------------+ ACA  unable to insonate +----------+--------------+----------+-----------+------------------+ Term ICA      55.00                  1.31                       +----------+--------------+----------+-----------+------------------+ PCA P1        42.00                  1.33                       +----------+--------------+----------+-----------+------------------+ Opthalmic     21.00                   1.3                        +----------+--------------+----------+-----------+------------------+ ICA siphon    16.00                  1.50                       +----------+--------------+----------+-----------+------------------+ Vertebral                                    unable to insonate +----------+--------------+----------+-----------+------------------+  +------------+-------+------------------+             VM cm/s     Comment       +------------+-------+------------------+ Prox Basilar       unable to insonate +------------+-------+------------------+ Dist Basilar       unable to insonate +------------+-------+------------------+ Summary:  Low normal mean flow velocities in majority of identified vessels of anterior circulation. Poor suboccipital window limits evaluation of posterior circulation vessels.Elevated pulsatility indices suggest diffuse intracranial atherosclerosis. *See table(s) above for TCD measurements and observations.  Diagnosing physician: Antony Contras MD Electronically signed by Antony Contras MD on 04/11/2022 at 10:20:53 AM.    Final    VAS US CAROTID (at [facility] and [place] only)  Result Date: 04/11/2022 Carotid Arterial Duplex Study Patient Name:  [name]  Date of Exam:   04/10/2022 Medical Rec #: [id number]         Accession #:    [id number] Date of Birth: Mar 31, 1945         Patient Gender: M Patient Age:   75 years Exam [facility]:  [facility] Procedure:      VAS US CAROTID Referring Phys: Adella Hare --------------------------------------------------------------------------------  Indications:       CVA and Speech disturbance. Risk Factors:      Hypertension, hyperlipidemia, Diabetes. Other Factors:     Aortic valve replacement on Coumadin. Limitations        Today's exam was limited due to patient sighing throughout                    exam. Comparison Study:  Prior normal carotid duplex done 12/27/20 Performing Technologist: Sharion Dove RVS  Examination Guidelines: A  complete evaluation includes B-mode imaging, spectral Doppler, color Doppler, and power Doppler as needed of all accessible portions of each vessel. Bilateral testing is considered an integral part of a complete examination. Limited examinations for reoccurring indications may be performed as noted.  Right Carotid Findings: +----------+--------+--------+--------+------------------+------------------+           PSV cm/sEDV cm/sStenosisPlaque DescriptionComments           +----------+--------+--------+--------+------------------+------------------+  CCA Prox  72      11                                intimal thickening +----------+--------+--------+--------+------------------+------------------+ CCA Distal55      14              calcific                             +----------+--------+--------+--------+------------------+------------------+ ICA Prox  57      18      1-39%   calcific                             +----------+--------+--------+--------+------------------+------------------+ ICA Mid   71      20                                                   +----------+--------+--------+--------+------------------+------------------+ ICA Distal37      8                                                    +----------+--------+--------+--------+------------------+------------------+ ECA       78      1                                                    +----------+--------+--------+--------+------------------+------------------+ +----------+--------+-------+--------+-------------------+           PSV cm/sEDV cmsDescribeArm Pressure (mmHG) +----------+--------+-------+--------+-------------------+ [id number]                                         +----------+--------+-------+--------+-------------------+ +---------+--------+--+--------+--+ VertebralPSV cm/s32EDV cm/s12 +---------+--------+--+--------+--+  Left Carotid Findings:  +----------+--------+--------+--------+------------------+--------+           PSV cm/sEDV cm/sStenosisPlaque DescriptionComments +----------+--------+--------+--------+------------------+--------+ CCA Prox  68      7               calcific                   +----------+--------+--------+--------+------------------+--------+ CCA Distal68      13              calcific                   +----------+--------+--------+--------+------------------+--------+ ICA Prox  71      18      1-39%   calcific          tortuous +----------+--------+--------+--------+------------------+--------+ ICA Mid   37      9                                          +----------+--------+--------+--------+------------------+--------+ ICA Distal105     20  tortuous +----------+--------+--------+--------+------------------+--------+ ECA       45      8                                          +----------+--------+--------+--------+------------------+--------+ +----------+--------+--------+--------+-------------------+           PSV cm/sEDV cm/sDescribeArm Pressure (mmHG) +----------+--------+--------+--------+-------------------+ [id number]                                          +----------+--------+--------+--------+-------------------+ +---------+--------+--+--------+-+ VertebralPSV cm/s26EDV cm/s6 +---------+--------+--+--------+-+   Summary: Right Carotid: Velocities in the right ICA are consistent with a 1-39% stenosis. Left Carotid: Velocities in the left ICA are consistent with a 1-39% stenosis. Vertebrals:  Bilateral vertebral arteries demonstrate antegrade flow. Subclavians: Normal flow hemodynamics were seen in bilateral subclavian              arteries. *See table(s) above for measurements and observations.  Electronically signed by Antony Contras MD on 04/11/2022 at 10:18:41 AM.    Final    ECHOCARDIOGRAM LIMITED  Result Date:  04/10/2022    ECHOCARDIOGRAM LIMITED REPORT   Patient Name:   [name] Date of Exam: 04/10/2022 Medical Rec #:  [id number]        Height:       69.0 in Accession #:    [id number]       Weight:       215.0 lb Date of Birth:  10-Jun-1945        BSA:          2.130 m Patient Age:    45 years         BP:           139/93 mmHg Patient Gender: M                HR:           61 bpm. Exam Location:  Inpatient Procedure: Limited Echo, Cardiac Doppler and Color Doppler Indications:    Stroke  History:        Patient has prior history of Echocardiogram examinations, most                 recent 03/14/2022. CAD, Prior Cardiac Surgery, Stroke, Aortic                 Valve Disease; Risk Factors:Diabetes, Former Smoker, Sleep Apnea                 and Hypertension.                 Aortic Valve: 23 mm St. Jude mechanical valve is present in the                 aortic position. Procedure Date: 11/12/03.  Sonographer:    Eartha Inch Referring Phys: Ennius.Cumber KENNETH C HILTY  Sonographer Comments: Technically difficult study due to poor echo windows. Image acquisition challenging due to patient body habitus and Image acquisition challenging due to respiratory motion. IMPRESSIONS  1. Left ventricular ejection fraction, by estimation, is 60 to 65%. The left ventricle has normal function. There is mild asymmetric left ventricular hypertrophy of the basal-septal segment.  2. The mitral valve is degenerative. Trivial mitral valve regurgitation.  3. Gadients across the mechancal aortic valve not measured.  The aortic valve was not well visualized. There is a 23 mm St. Jude mechanical valve present in the aortic position. Procedure Date: 11/12/03. Echo findings are consistent with normal structure  and function of the aortic valve prosthesis.  4. The inferior vena cava is normal in size with greater than 50% respiratory variability, suggesting right atrial pressure of 3 mmHg.  5. No evidence of endocarditis on limited TTE. Consider TEE if  clinical suspicion for endocarditis is high. FINDINGS  Left Ventricle: Left ventricular ejection fraction, by estimation, is 60 to 65%. The left ventricle has normal function. Definity contrast agent was given IV to delineate the left ventricular endocardial borders. There is mild asymmetric left ventricular hypertrophy of the basal-septal segment. Mitral Valve: The mitral valve is degenerative in appearance. Mild mitral annular calcification. Trivial mitral valve regurgitation. Tricuspid Valve: The tricuspid valve is not well visualized. Aortic Valve: Gadients across the mechancal aortic valve not measured. The aortic valve was not well visualized. There is a 23 mm St. Jude mechanical valve present in the aortic position. Procedure Date: 11/12/03. Echo findings are consistent with normal  structure and function of the aortic valve prosthesis. Pulmonic Valve: The pulmonic valve was not well visualized. Venous: The inferior vena cava is normal in size with greater than 50% respiratory variability, suggesting right atrial pressure of 3 mmHg. Additional Comments: Spectral Doppler performed. Color Doppler performed.  Glori Bickers MD Electronically signed by Glori Bickers MD Signature Date/Time: 04/10/2022/4:40:05 PM    Final     Scheduled Meds:     stroke: early stages of recovery book   Does not apply Once   amLODipine  5 mg Oral q AM   aspirin EC  81 mg Oral Daily   clopidogrel  75 mg Oral Daily   dapagliflozin propanediol  10 mg Oral QAC breakfast   fenofibrate  160 mg Oral Daily   furosemide  20 mg Oral q AM   insulin aspart  14 Units Subcutaneous Q0600   insulin aspart  16 Units Subcutaneous Q breakfast   insulin aspart  16 Units Subcutaneous Q2000   insulin glargine-yfgn  22 Units Subcutaneous QHS   isosorbide mononitrate  30 mg Oral Daily   metoprolol succinate  25 mg Oral Daily   rosuvastatin  40 mg Oral QHS   tamsulosin  0.4 mg Oral Daily   [START ON 04/12/2022] warfarin  7.5 mg Oral  ONCE-1600   Warfarin - Pharmacist Dosing Inpatient   Does not apply q1600    Continuous Infusions:    sodium chloride 50 mL/hr at 04/10/22 1634   heparin 1,350 Units/hr (04/11/22 1046)     LOS: 1 day     Vernell Leep, MD,  FACP, [place], [facility], [facility], [facility]   [facility] [place]     To contact the attending provider between 7A-7P or the covering provider during after hours 7P-7A, please log into the web site [email or web address] and access using universal [place] password for that web site. If you do not have the password, please call the hospital operator.  04/11/2022, 3:58 PM

## 2022-04-11 NOTE — Progress Notes (Signed)
STROKE TEAM PROGRESS NOTE   INTERVAL HISTORY Patient is seen in his room .  Transthoracic echo was normal.  Carotid ultrasound showed no significant extracranial stenosis.  Transcranial Doppler study shows low normal velocities throughout with poor windows.  INR this morning is 2.8.  Cardiology recommends INR range between 3-3 0.5 Vitals:   04/10/22 2100 04/10/22 2258 04/11/22 0015 04/11/22 0300  BP: (!) 150/72  (!) 160/74 (!) 146/71  Pulse: 70 68 60 60  Resp: '17 16 18 19  '$ Temp: 98 F (36.7 C)  98.3 F (36.8 C) 98.2 F (36.8 C)  TempSrc: Oral  Oral Oral  SpO2: 94% 96% 97% 96%  Weight:      Height:       CBC:  Recent Labs  Lab 04/09/22 0630 04/10/22 0030 04/11/22 0341  WBC 8.3 9.7 7.4  NEUTROABS 6.2  --   --   HGB 13.3 13.7 12.6*  HCT 39.5 40.4 38.2*  MCV 87.4 86.3 87.0  PLT 255 279 [id number]   Basic Metabolic Panel:  Recent Labs  Lab 04/09/22 0630 04/11/22 0341  NA 138 136  K 3.3* 3.3*  CL 102 106  CO2 23 21*  GLUCOSE 152* 114*  BUN 33* 27*  CREATININE 1.27* 1.41*  CALCIUM 9.0 8.5*  MG  --  1.9   Lipid Panel:  Recent Labs  Lab 04/10/22 0030  CHOL 136  TRIG 252*  HDL 30*  CHOLHDL 4.5  VLDL 50*  LDLCALC 56   HgbA1c:  Recent Labs  Lab 04/10/22 0030  HGBA1C 7.3*   Urine Drug Screen:  Recent Labs  Lab 04/09/22 0753  LABOPIA NONE DETECTED  COCAINSCRNUR NONE DETECTED  LABBENZ NONE DETECTED  AMPHETMU NONE DETECTED  THCU NONE DETECTED  LABBARB NONE DETECTED    Alcohol Level  Recent Labs  Lab 04/09/22 0630  ETH <10    IMAGING past 24 hours VAS [name] TRANSCRANIAL DOPPLER  Result Date: 04/11/2022  Transcranial Doppler Patient Name:  KENDAN YOKOYAMA  Date of Exam:   04/10/2022 Medical Rec #: [id number]         Accession #:    [id number] Date of Birth: 1945-10-07         Patient Gender: M Patient Age:   77 years Exam Location:  [facility] Procedure:      VAS [name] TRANSCRANIAL DOPPLER Referring Phys: Elwin Sleight DE LA TORRE  --------------------------------------------------------------------------------  Indications: Stroke. History: Aortic valve replacement, on Coumadin. CAD, HTN, HLD, DM. Limitations: Body habitus. Comparison Study: No prior study Performing Technologist: Sharion Dove RVS  Examination Guidelines: A complete evaluation includes B-mode imaging, spectral Doppler, color Doppler, and power Doppler as needed of all accessible portions of each vessel. Bilateral testing is considered an integral part of a complete examination. Limited examinations for reoccurring indications may be performed as noted.  +----------+---------------+----------+-----------+------------------+ RIGHT TCD Right VM (cm/s)Depth (cm)Pulsatility     Comment       +----------+---------------+----------+-----------+------------------+ MCA            39.00                  1.74                       +----------+---------------+----------+-----------+------------------+ ACA           -25.00                  1.16                       +----------+---------------+----------+-----------+------------------+  Term ICA       59.00                   94                        +----------+---------------+----------+-----------+------------------+ PCA P1         51.00                  1.31                       +----------+---------------+----------+-----------+------------------+ Opthalmic      17.00                  1.71                       +----------+---------------+----------+-----------+------------------+ ICA siphon                                    unable to insonate +----------+---------------+----------+-----------+------------------+ Vertebral     -18.00                  0.93                       +----------+---------------+----------+-----------+------------------+  +----------+--------------+----------+-----------+------------------+ LEFT TCD  Left VM (cm/s)Depth (cm)Pulsatility     Comment        +----------+--------------+----------+-----------+------------------+ MCA           24.00                  0.99                       +----------+--------------+----------+-----------+------------------+ ACA                                          unable to insonate +----------+--------------+----------+-----------+------------------+ Term ICA      55.00                  1.31                       +----------+--------------+----------+-----------+------------------+ PCA P1        42.00                  1.33                       +----------+--------------+----------+-----------+------------------+ Opthalmic     21.00                   1.3                       +----------+--------------+----------+-----------+------------------+ ICA siphon    16.00                  1.50                       +----------+--------------+----------+-----------+------------------+ Vertebral                                    unable to insonate +----------+--------------+----------+-----------+------------------+  +------------+-------+------------------+  VM cm/s     Comment       +------------+-------+------------------+ Prox Basilar       unable to insonate +------------+-------+------------------+ Dist Basilar       unable to insonate +------------+-------+------------------+ Summary:  Low normal mean flow velocities in majority of identified vessels of anterior circulation. Poor suboccipital window limits evaluation of posterior circulation vessels.Elevated pulsatility indices suggest diffuse intracranial atherosclerosis. *See table(s) above for TCD measurements and observations.  Diagnosing physician: Antony Contras MD Electronically signed by Antony Contras MD on 04/11/2022 at 10:20:53 AM.    Final    VAS US CAROTID (at [facility] only)  Result Date: 04/11/2022 Carotid Arterial Duplex Study Patient Name:  LUIGI LORMAN  Date of Exam:   04/10/2022 Medical Rec #:  [id number]         Accession #:    [id number] Date of Birth: 1945-08-09         Patient Gender: M Patient Age:   53 years Exam Location:  [facility] Procedure:      VAS US CAROTID Referring Phys: Adella Hare --------------------------------------------------------------------------------  Indications:       CVA and Speech disturbance. Risk Factors:      Hypertension, hyperlipidemia, Diabetes. Other Factors:     Aortic valve replacement on Coumadin. Limitations        Today's exam was limited due to patient sighing throughout                    exam. Comparison Study:  Prior normal carotid duplex done 12/27/20 Performing Technologist: Sharion Dove RVS  Examination Guidelines: A complete evaluation includes B-mode imaging, spectral Doppler, color Doppler, and power Doppler as needed of all accessible portions of each vessel. Bilateral testing is considered an integral part of a complete examination. Limited examinations for reoccurring indications may be performed as noted.  Right Carotid Findings: +----------+--------+--------+--------+------------------+------------------+           PSV cm/sEDV cm/sStenosisPlaque DescriptionComments           +----------+--------+--------+--------+------------------+------------------+ CCA Prox  72      11                                intimal thickening +----------+--------+--------+--------+------------------+------------------+ CCA Distal55      14              calcific                             +----------+--------+--------+--------+------------------+------------------+ ICA Prox  57      18      1-39%   calcific                             +----------+--------+--------+--------+------------------+------------------+ ICA Mid   71      20                                                   +----------+--------+--------+--------+------------------+------------------+ ICA Distal37      8                                                     +----------+--------+--------+--------+------------------+------------------+  ECA       78      1                                                    +----------+--------+--------+--------+------------------+------------------+ +----------+--------+-------+--------+-------------------+           PSV cm/sEDV cmsDescribeArm Pressure (mmHG) +----------+--------+-------+--------+-------------------+ [id number]                                         +----------+--------+-------+--------+-------------------+ +---------+--------+--+--------+--+ VertebralPSV cm/s32EDV cm/s12 +---------+--------+--+--------+--+  Left Carotid Findings: +----------+--------+--------+--------+------------------+--------+           PSV cm/sEDV cm/sStenosisPlaque DescriptionComments +----------+--------+--------+--------+------------------+--------+ CCA Prox  68      7               calcific                   +----------+--------+--------+--------+------------------+--------+ CCA Distal68      13              calcific                   +----------+--------+--------+--------+------------------+--------+ ICA Prox  71      18      1-39%   calcific          tortuous +----------+--------+--------+--------+------------------+--------+ ICA Mid   37      9                                          +----------+--------+--------+--------+------------------+--------+ ICA Distal105     20                                tortuous +----------+--------+--------+--------+------------------+--------+ ECA       45      8                                          +----------+--------+--------+--------+------------------+--------+ +----------+--------+--------+--------+-------------------+           PSV cm/sEDV cm/sDescribeArm Pressure (mmHG) +----------+--------+--------+--------+-------------------+ [id number]                                           +----------+--------+--------+--------+-------------------+ +---------+--------+--+--------+-+ VertebralPSV cm/s26EDV cm/s6 +---------+--------+--+--------+-+   Summary: Right Carotid: Velocities in the right ICA are consistent with a 1-39% stenosis. Left Carotid: Velocities in the left ICA are consistent with a 1-39% stenosis. Vertebrals:  Bilateral vertebral arteries demonstrate antegrade flow. Subclavians: Normal flow hemodynamics were seen in bilateral subclavian              arteries. *See table(s) above for measurements and observations.  Electronically signed by Antony Contras MD on 04/11/2022 at 10:18:41 AM.    Final    ECHOCARDIOGRAM LIMITED  Result Date: 04/10/2022    ECHOCARDIOGRAM LIMITED REPORT   Patient Name:   MAXIMILLIANO WHEELER Date of Exam: 04/10/2022 Medical Rec #:  [id number]  Height:       69.0 in Accession #:    [id number]       Weight:       215.0 lb Date of Birth:  02/19/1945        BSA:          2.130 m Patient Age:    5 years         BP:           139/93 mmHg Patient Gender: M                HR:           61 bpm. Exam Location:  Inpatient Procedure: Limited Echo, Cardiac Doppler and Color Doppler Indications:    Stroke  History:        Patient has prior history of Echocardiogram examinations, most                 recent 03/14/2022. CAD, Prior Cardiac Surgery, Stroke, Aortic                 Valve Disease; Risk Factors:Diabetes, Former Smoker, Sleep Apnea                 and Hypertension.                 Aortic Valve: 23 mm St. Jude mechanical valve is present in the                 aortic position. Procedure Date: 11/12/03.  Sonographer:    Eartha Inch Referring Phys: Ennius.Cumber KENNETH C HILTY  Sonographer Comments: Technically difficult study due to poor echo windows. Image acquisition challenging due to patient body habitus and Image acquisition challenging due to respiratory motion. IMPRESSIONS  1. Left ventricular ejection fraction, by estimation, is 60 to 65%. The left ventricle  has normal function. There is mild asymmetric left ventricular hypertrophy of the basal-septal segment.  2. The mitral valve is degenerative. Trivial mitral valve regurgitation.  3. Gadients across the mechancal aortic valve not measured. The aortic valve was not well visualized. There is a 23 mm St. Jude mechanical valve present in the aortic position. Procedure Date: 11/12/03. Echo findings are consistent with normal structure  and function of the aortic valve prosthesis.  4. The inferior vena cava is normal in size with greater than 50% respiratory variability, suggesting right atrial pressure of 3 mmHg.  5. No evidence of endocarditis on limited TTE. Consider TEE if clinical suspicion for endocarditis is high. FINDINGS  Left Ventricle: Left ventricular ejection fraction, by estimation, is 60 to 65%. The left ventricle has normal function. Definity contrast agent was given IV to delineate the left ventricular endocardial borders. There is mild asymmetric left ventricular hypertrophy of the basal-septal segment. Mitral Valve: The mitral valve is degenerative in appearance. Mild mitral annular calcification. Trivial mitral valve regurgitation. Tricuspid Valve: The tricuspid valve is not well visualized. Aortic Valve: Gadients across the mechancal aortic valve not measured. The aortic valve was not well visualized. There is a 23 mm St. Jude mechanical valve present in the aortic position. Procedure Date: 11/12/03. Echo findings are consistent with normal  structure and function of the aortic valve prosthesis. Pulmonic Valve: The pulmonic valve was not well visualized. Venous: The inferior vena cava is normal in size with greater than 50% respiratory variability, suggesting right atrial pressure of 3 mmHg. Additional Comments: Spectral Doppler performed. Color Doppler performed.  Glori Bickers MD Electronically signed by Quillian Quince  Bensimhon MD Signature Date/Time: 04/10/2022/4:40:05 PM    Final     PHYSICAL  EXAM General: Alert, well-nourished, well-developed elderly patient in no acute distress Respiratory: Regular, unlabored respirations on room air  NEURO:  Mental Status: AA&Ox3  Speech/Language: speech is without dysarthria but with subtle paraphasic errors.  Naming, repetition, fluency, and comprehension intact.  Cranial Nerves:  II: PERRL.  III, IV, VI: EOMI. Eyelids elevate symmetrically.  V: Sensation is intact to light touch and symmetrical to face.  VII: Smile is symmetrical.  VIII: hearing intact to voice. IX, X: Phonation is normal.  [id number] shrug 5/5. XII: tongue is midline without fasciculations. Motor: 5/5 strength to all muscle groups tested.  Tone: is normal and bulk is normal Sensation- Intact to light touch bilaterally.  Coordination: FTN intact bilaterally.No drift.  Gait- deferred   ASSESSMENT/PLAN Mr. HABRAM CALER is a 77 y.o. male with history of CAD status post CABG, aortic valve replacement with mechanical valve on Coumadin, A-fib, hyperlipidemia, hypertension, stroke in December 2023, sleep apnea on CPAP, stage III chronic kidney disease and diabetes with neuropathy presenting with acute onset aphasia, trouble getting his words out and slurred speech.  He continues to have subtle paraphasic errors,, but this may be due to his recent stroke in December.  He is noted to be on Plavix and Coumadin for mechanical heart valve and atrial fibrillation, however his INR is subtherapeutic at 2.2.  Stroke: Small left MCA infarct likely embolism from mechanical heart valve Etiology: Likely embolic in the setting of subtherapeutic INR CT head No acute abnormality.  Chronic left MCA and cerebellar infarcts MRI tiny acute infarct in left frontal operculum near chronic left MCA infarct, occasional punctate chronic microhemorrhages and chronic bilateral cerebellar and left MCA infarcts Intracranial Doppler pending Carotid Doppler bilateral 1-39% stenosis  2D Echo normal  EF.  No evidence of endocarditis or clot  LDL 56 HgbA1c 8.2 VTE prophylaxis -fully anticoagulated with Coumadin    Diet   Diet heart healthy/carb modified Room service appropriate? Yes; Fluid consistency: Thin   clopidogrel 75 mg daily and warfarin daily prior to admission, now on clopidogrel 75 mg daily and warfarin daily.  Will consider discontinuing Plavix and switching back to aspirin 81 mg.  Along with warfarin with INR goal between 3-3 .5 as per cardiology Therapy recommendations: Pending Disposition: Pending  Atrial fibrillation Patient has history of PAF Fully anticoagulated with Coumadin  Mechanical aortic valve Patient had mechanical aortic valve placed 18 years ago Fully anticoagulated with Coumadin, but INR is subtherapeutic at 2.2 Consult pharmacy to bring INR into therapeutic range  Hypertension Home meds: Norvasc 5 mg daily Stable Permissive hypertension (OK if < 220/120) but gradually normalize in 5-7 days Long-term BP goal normotensive  Hyperlipidemia Home meds: Rosuvastatin 40 mg daily and fenofibrate 145 mg daily, resumed in hospital LDL 56, goal < 70 Continue statin at discharge  Diabetes type II Uncontrolled Home meds: Farxiga 10 mg daily, insulin aspart 16 units with breakfast, 14 units with lunch and 16 units with dinner, insulin glargine 22 units daily HgbA1c 8.2, goal < 7.0 CBGs Recent Labs    04/10/22 1728 04/10/22 2120 04/11/22 0813  GLUCAP 122* 151* 142*  Follow-up with PCP for better diabetes control SSI  Other Stroke Risk Factors Advanced Age >/= 43  Cigarette smoker advised to stop smoking Obesity, Body mass index is 31.75 kg/m., BMI >/= 30 associated with increased stroke risk, recommend weight loss, diet and exercise as appropriate  Hx stroke Coronary  artery disease Obstructive sleep apnea, on CPAP at home  Other Active Problems None  Hospital day # 1   Patient presented with small left insular infarct likely due to  cardiogenic embolism for mechanical heart valve with subtherapeutic anticoagulation.  Recommend increase INR goal to 3-3.5.  Discontinue Plavix and switch back to aspirin 81 mg daily as per discussion with Dr. Debara Pickett cardiology.  Discharge home when INR is 3.  Discussed with Dr. Debara Pickett and Dr. Algis Liming greater than 50% time during this 35-minute visit were spent in counseling and coordination of care about his embolic stroke and discussion about anticoagulation.  INR: 3 questions.  Antony Contras, MD Medical Director [facility] Pager: [phone] 04/11/2022 10:46 AM   To contact Stroke Continuity provider, please refer to [email or web address] After hours, contact General Neurology

## 2022-04-11 NOTE — Progress Notes (Addendum)
[place] for heparin and warfarin  Indication:  mechanical aortic valve with subtherapeutic INR  w acute stroke   No Known Allergies  Patient Measurements: Height: '5\' 9"'$  (175.3 cm) Weight: 97.5 kg (215 lb) IBW/kg (Calculated) : 70.7 Heparin Dosing Weight: 91.1kg   Vital Signs: Temp: 98.2 F (36.8 C) (03/12 0300) Temp Source: Oral (03/12 0300) BP: 146/71 (03/12 0300) Pulse Rate: 60 (03/12 0300)  Labs: Recent Labs    04/09/22 0540 04/09/22 0630 04/09/22 0630 04/10/22 0030 04/10/22 0939 04/10/22 1252 04/11/22 0341  HGB 13.6 13.3  --  13.7  --   --  12.6*  HCT 40.0 39.5  --  40.4  --   --  38.2*  PLT  --  255  --  279  --   --  232  APTT  --  31  --   --   --   --   --   LABPROT  --  24.6*  --  24.0*  --   --  29.4*  INR  --  2.2*  --  2.2*  --   --  2.8*  HEPARINUNFRC  --   --    < > <0.10* >1.10* 0.30 0.47  CREATININE 1.20 1.27*  --   --   --   --  1.41*   < > = values in this interval not displayed.   Estimated Creatinine Clearance: 50.5 mL/min (A) (by C-G formula based on SCr of 1.41 mg/dL (H)).   Assessment: Patient presenting with CC of stroke like symptoms. MRI showing small acute infarct in addition more chronic MCA infarct. Patient on warfarin prior to admission: 3.'75mg'$  MWF, 7.'5mg'$  all other days. Pharmacy consulted for heparin bridge with warfarin restart. Last dose of warfarin on 3/9 around 22:00 per patient.   INR within range this AM at 2.8 Heparin level therapeutic, HgB 12.6  Goal of Therapy:  INR: 2.5-3.5 Anti-Xa: 0.3-0.5  Monitor platelets by anticoagulation protocol: Yes   Plan:  Warfarin 3.75 mg PO x 1 today Continue heparin at 1350 units / hr - dc heparin in AM if INR within range  Thank you Anette Guarneri, PharmD 04/11/2022 7:59 AM  PM -- > new INR goal of 3 to 3.5 - will increase dose to 7.5 mg po x 1 for today. Thank you. Anette Guarneri, PharmD

## 2022-04-11 NOTE — Progress Notes (Signed)
   Echo personally reviewed - no obvious thrombus of the valve, but echo windows are limited. Discussed with Dr. Algis Liming - would recommend targeting high-end of the therapeutic INR range (3.0-3.5) - continue heparin bridge until he reaches target. Will need closer outpatient INR monitoring.  No further suggestions. Cardiology will sign-off.  [place] will sign off.   Medication Recommendations:  as above Other recommendations (labs, testing, etc):  none Follow up as an outpatient:  Dr. Stanford Breed and the pharmacy coumadin clinic  Pixie Casino, MD, [facility], [place] Director of the Advanced Lipid Disorders &  Cardiovascular Risk Reduction Clinic Diplomate of the American Board of Clinical Lipidology Attending Cardiologist  Direct Dial: [phone]  Fax: [phone]  Website:  [email or web address]

## 2022-04-12 DIAGNOSIS — I639 Cerebral infarction, unspecified: Secondary | ICD-10-CM | POA: Diagnosis not present

## 2022-04-12 DIAGNOSIS — I10 Essential (primary) hypertension: Secondary | ICD-10-CM | POA: Diagnosis not present

## 2022-04-12 DIAGNOSIS — R339 Retention of urine, unspecified: Secondary | ICD-10-CM | POA: Diagnosis not present

## 2022-04-12 DIAGNOSIS — E1159 Type 2 diabetes mellitus with other circulatory complications: Secondary | ICD-10-CM | POA: Diagnosis not present

## 2022-04-12 LAB — CBC
HCT: 42.5 % (ref 39.0–52.0)
Hemoglobin: 14.4 g/dL (ref 13.0–17.0)
MCH: 29.3 pg (ref 26.0–34.0)
MCHC: 33.9 g/dL (ref 30.0–36.0)
MCV: 86.4 fL (ref 80.0–100.0)
Platelets: 246 10*3/uL (ref 150–400)
RBC: 4.92 MIL/uL (ref 4.22–5.81)
RDW: 14.2 % (ref 11.5–15.5)
WBC: 8.2 10*3/uL (ref 4.0–10.5)
nRBC: 0 % (ref 0.0–0.2)

## 2022-04-12 LAB — BASIC METABOLIC PANEL
Anion gap: 13 (ref 5–15)
BUN: 26 mg/dL — ABNORMAL HIGH (ref 8–23)
CO2: 19 mmol/L — ABNORMAL LOW (ref 22–32)
Calcium: 9.1 mg/dL (ref 8.9–10.3)
Chloride: 101 mmol/L (ref 98–111)
Creatinine, Ser: 1.38 mg/dL — ABNORMAL HIGH (ref 0.61–1.24)
GFR, Estimated: 53 mL/min — ABNORMAL LOW (ref >60)
Glucose, Bld: 139 mg/dL — ABNORMAL HIGH (ref 70–99)
Potassium: 3.8 mmol/L (ref 3.5–5.1)
Sodium: 133 mmol/L — ABNORMAL LOW (ref 135–145)

## 2022-04-12 LAB — PROTIME-INR
INR: 2.6 — ABNORMAL HIGH (ref 0.8–1.2)
Prothrombin Time: 27.2 seconds — ABNORMAL HIGH (ref 11.4–15.2)

## 2022-04-12 LAB — HEPARIN LEVEL (UNFRACTIONATED): Heparin Unfractionated: 0.36 IU/mL (ref 0.30–0.70)

## 2022-04-12 LAB — GLUCOSE, CAPILLARY
Glucose-Capillary: 152 mg/dL — ABNORMAL HIGH (ref 70–99)
Glucose-Capillary: 134 mg/dL — ABNORMAL HIGH (ref 70–99)
Glucose-Capillary: 97 mg/dL (ref 70–99)

## 2022-04-12 MED ORDER — WARFARIN SODIUM 5 MG PO TABS
10.0000 mg | ORAL_TABLET | Freq: Once | ORAL | Status: AC
  Administered 2022-04-12: 10 mg via ORAL
  Filled 2022-04-12: qty 2

## 2022-04-12 NOTE — Plan of Care (Signed)
  Problem: Education: Goal: Knowledge of disease or condition will improve Outcome: Progressing Goal: Knowledge of secondary prevention will improve (MUST DOCUMENT ALL) Outcome: Progressing Goal: Knowledge of patient specific risk factors will improve (Mark N/A or DELETE if not current risk factor) Outcome: Progressing   Problem: Ischemic Stroke/TIA Tissue Perfusion: Goal: Complications of ischemic stroke/TIA will be minimized Outcome: Progressing   Problem: Coping: Goal: Will verbalize positive feelings about self Outcome: Progressing Goal: Will identify appropriate support needs Outcome: Progressing   Problem: Health Behavior/Discharge Planning: Goal: Ability to manage health-related needs will improve Outcome: Progressing Goal: Goals will be collaboratively established with patient/family Outcome: Progressing   

## 2022-04-12 NOTE — Progress Notes (Signed)
[place] for heparin and warfarin  Indication:  mechanical aortic valve with subtherapeutic INR  w acute stroke   No Known Allergies  Patient Measurements: Height: '5\' 9"'$  (175.3 cm) Weight: 97.5 kg (215 lb) IBW/kg (Calculated) : 70.7 Heparin Dosing Weight: 91.1kg   Vital Signs: Temp: 97.9 F (36.6 C) (03/13 0100) Temp Source: Oral (03/13 0100) BP: 127/102 (03/13 0100) Pulse Rate: 63 (03/13 0100)  Labs: Recent Labs    04/10/22 0030 04/10/22 0939 04/10/22 1252 04/11/22 0341 04/11/22 1415 04/12/22 0324  HGB 13.7  --   --  12.6*  --  14.4  HCT 40.4  --   --  38.2*  --  42.5  PLT 279  --   --  232  --  246  LABPROT 24.0*  --   --  29.4* 28.3* 27.2*  INR 2.2*  --   --  2.8* 2.7* 2.6*  HEPARINUNFRC <0.10*   < > 0.30 0.47  --  0.36  CREATININE  --   --   --  1.41*  --  1.38*   < > = values in this interval not displayed.   Estimated Creatinine Clearance: 51.6 mL/min (A) (by C-G formula based on SCr of 1.38 mg/dL (H)).   Assessment: Patient presenting with CC of stroke like symptoms. MRI showing small acute infarct in addition more chronic MCA infarct. Patient on warfarin prior to admission: 3.'75mg'$  MWF, 7.'5mg'$  all other days. Pharmacy consulted for heparin bridge with warfarin restart. Last dose of warfarin on 3/9 around 22:00 per patient.   INR 2.6 this AM Heparin level therapeutic, HgB 12.6  Goal of Therapy:  INR: 2.5-3.5 (target 3 to 3.5) Anti-Xa: 0.3-0.5  Monitor platelets by anticoagulation protocol: Yes   Plan:  Warfarin 10 mg po x 1 now  Continue heparin at 1350 units / hr - dc heparin in AM if INR within range  Thank you Anette Guarneri, PharmD 04/12/2022 7:17 AM

## 2022-04-12 NOTE — Progress Notes (Signed)
PROGRESS NOTE        PATIENT DETAILS Name: [name] Age: 77 y.o. Sex: male Date of Birth: 1945/04/22 Admit Date: 04/09/2022 Admitting Physician Neena Rhymes, MD PCP:O'Sullivan, Lenna Sciara, NP  Brief Summary: Patient is a 77 y.o.  male with history of CAD s/p CABG, aortic valve replacement (mechanical valve) on coumadin, PAF, prior CVA's, HLD, HTN, CKD stage II, OSA on CPAP-who presented with transient aphasia-upon further evaluation-patient was found to have acute CVA in the setting of subtherapeutic INR.  Significant events: 3/10>> admit to [facility]  Significant studies: 3/10>> MRI brain: Acute infarct left frontal operculum 3/11>> echo: EF 60-65%, normal structure/function of aortic valve prosthesis. 3/11>> carotid Doppler: No significant stenosis 3/11>A1C:7.3 3/11>>LDL: 56  Significant microbiology data: None  Procedures: None  Consults: Neurology Cardiology  Subjective: Lying comfortably in bed-denies any chest pain or shortness of breath.Speech is much better and almost at baseline  Objective: Vitals: Blood pressure (!) 145/68, pulse 61, temperature 97.9 F (36.6 C), temperature source Oral, resp. rate 17, height '5\' 9"'$  (1.753 m), weight 97.5 kg, SpO2 97 %.   Exam: Gen Exam:Alert awake-not in any distress HEENT:atraumatic, normocephalic Chest: B/L clear to auscultation anteriorly CVS:S1S2 regular Abdomen:soft non tender, non distended Extremities:no edema Neurology: Non focal Skin: no rash  Pertinent Labs/Radiology:    Latest Ref Rng & Units 04/12/2022    3:24 AM 04/11/2022    3:41 AM 04/10/2022   12:30 AM  CBC  WBC 4.0 - 10.5 K/uL 8.2  7.4  9.7   Hemoglobin 13.0 - 17.0 g/dL 14.4  12.6  13.7   Hematocrit 39.0 - 52.0 % 42.5  38.2  40.4   Platelets 150 - 400 K/uL 246  232  279     Lab Results  Component Value Date   NA 133 (L) 04/12/2022   K 3.8 04/12/2022   CL 101 04/12/2022   CO2 19 (L) 04/12/2022       Assessment/Plan: Expressive Aphasia 2/2 Acute Embolic CVA due to subtherapeutic INR in the setting of PAF/mechanical aortic valve replacement Speech improved-almost back to baseline Work up as above After discussion with cardiology/neurology-remains on IV heparin until INR> 3 (goal INR 3-3.5). Coumadin being managed by pharmacy Per neurology note-okay to stop Plavix and continue aspirin.  History of prior CVAs-most recent had CVA secondary to left MCA occlusion-s/p thrombectomy December 2023  History of CAD s/p CABG No anginal symptoms  Aortic valve replacement-mechanical valve Overlapping IV heparin-Coumadin until INR at goal (3-3.5)  PAF Sinus rhythm Continue Toprol-XL Overlapping IV heparin-Coumadin until INR at goal (3-3.5)  HLD LDL at goal Continue statin/fenofibrate  HTN BP stable Continue amlodipine, Imdur, Toprol, furosemide  DM-2 with hyperglycemia CBG stable Continue Farxiga, Semglee 22 units daily and scheduled Premeal NovoLog per home regimen  Recent Labs    04/11/22 1132 04/11/22 1529 04/12/22 0827  GLUCAP 138* 155* 152*    Chronic urinary retention BPH Foley catheter recently removed by Dr. Diona Fanti as outpatient-now getting intermittent intermittent in/out catheterizations Continue Flomax  CKD stage 2 At baseline  Obesity: Estimated body mass index is 31.75 kg/m as calculated from the following:   Height as of this encounter: '5\' 9"'$  (1.753 m).   Weight as of this encounter: 97.5 kg.   Code status:   Code Status: Full Code   DVT Prophylaxis: IV heparin/Coumadin   Family Communication:  None at bedside   Disposition Plan: Status is: Inpatient Remains inpatient appropriate because: Severity of illness-INR needs to be therapeutic prior to discharge.   Planned Discharge Destination:Home   Diet: Diet Order             Diet heart healthy/carb modified Room service appropriate? Yes; Fluid consistency: Thin  Diet effective now                      Antimicrobial agents: Anti-infectives (From admission, onward)    None        MEDICATIONS: Scheduled Meds:   stroke: early stages of recovery book   Does not apply Once   amLODipine  5 mg Oral q AM   aspirin EC  81 mg Oral Daily   clopidogrel  75 mg Oral Daily   dapagliflozin propanediol  10 mg Oral QAC breakfast   fenofibrate  160 mg Oral Daily   furosemide  20 mg Oral q AM   insulin aspart  14 Units Subcutaneous Q0600   insulin aspart  16 Units Subcutaneous Q breakfast   insulin aspart  16 Units Subcutaneous Q2000   insulin glargine-yfgn  22 Units Subcutaneous QHS   isosorbide mononitrate  30 mg Oral Daily   metoprolol succinate  25 mg Oral Daily   rosuvastatin  40 mg Oral QHS   tamsulosin  0.4 mg Oral Daily   Warfarin - Pharmacist Dosing Inpatient   Does not apply q1600   Continuous Infusions:  sodium chloride 50 mL/hr at 04/12/22 0516   heparin 1,350 Units/hr (04/12/22 0520)   PRN Meds:.acetaminophen **OR** acetaminophen (TYLENOL) oral liquid 160 mg/5 mL **OR** acetaminophen, senna-docusate, triamcinolone cream   I have personally reviewed following labs and imaging studies  LABORATORY DATA: CBC: Recent Labs  Lab 04/09/22 0540 04/09/22 0630 04/10/22 0030 04/11/22 0341 04/12/22 0324  WBC  --  8.3 9.7 7.4 8.2  NEUTROABS  --  6.2  --   --   --   HGB 13.6 13.3 13.7 12.6* 14.4  HCT 40.0 39.5 40.4 38.2* 42.5  MCV  --  87.4 86.3 87.0 86.4  PLT  --  255 279 232 0000000    Basic Metabolic Panel: Recent Labs  Lab 04/09/22 0540 04/09/22 0630 04/11/22 0341 04/12/22 0324  NA 139 138 136 133*  K 3.4* 3.3* 3.3* 3.8  CL 102 102 106 101  CO2  --  23 21* 19*  GLUCOSE 152* 152* 114* 139*  BUN 34* 33* 27* 26*  CREATININE 1.20 1.27* 1.41* 1.38*  CALCIUM  --  9.0 8.5* 9.1  MG  --   --  1.9  --     GFR: Estimated Creatinine Clearance: 51.6 mL/min (A) (by C-G formula based on SCr of 1.38 mg/dL (H)).  Liver Function Tests: Recent Labs  Lab  04/09/22 0630  AST 23  ALT 15  ALKPHOS 41  BILITOT 0.6  PROT 6.8  ALBUMIN 3.5   No results for input(s): "LIPASE", "AMYLASE" in the last 168 hours. No results for input(s): "AMMONIA" in the last 168 hours.  Coagulation Profile: Recent Labs  Lab 04/09/22 0630 04/10/22 0030 04/11/22 0341 04/11/22 1415 04/12/22 0324  INR 2.2* 2.2* 2.8* 2.7* 2.6*    Cardiac Enzymes: No results for input(s): "CKTOTAL", "CKMB", "CKMBINDEX", "TROPONINI" in the last 168 hours.  BNP (last 3 results) No results for input(s): "PROBNP" in the last 8760 hours.  Lipid Profile: Recent Labs    04/10/22 0030  CHOL 136  HDL  30*  LDLCALC 56  TRIG 252*  CHOLHDL 4.5    Thyroid Function Tests: No results for input(s): "TSH", "T4TOTAL", "FREET4", "T3FREE", "THYROIDAB" in the last 72 hours.  Anemia Panel: No results for input(s): "VITAMINB12", "FOLATE", "FERRITIN", "TIBC", "IRON", "RETICCTPCT" in the last 72 hours.  Urine analysis:    Component Value Date/Time   COLORURINE YELLOW 04/09/2022 0753   APPEARANCEUR HAZY (A) 04/09/2022 0753   LABSPEC 1.014 04/09/2022 0753   PHURINE 6.0 04/09/2022 0753   GLUCOSEU >=500 (A) 04/09/2022 0753   GLUCOSEU NEGATIVE 01/08/2015 1032   HGBUR LARGE (A) 04/09/2022 0753   BILIRUBINUR NEGATIVE 04/09/2022 0753   BILIRUBINUR moderate 05/29/2013 1110   KETONESUR NEGATIVE 04/09/2022 0753   PROTEINUR 30 (A) 04/09/2022 0753   UROBILINOGEN 0.2 01/08/2015 1032   NITRITE NEGATIVE 04/09/2022 0753   LEUKOCYTESUR LARGE (A) 04/09/2022 0753    Sepsis Labs: Lactic Acid, Venous No results found for: "LATICACIDVEN"  MICROBIOLOGY: No results found for this or any previous visit (from the past 240 hour(s)).  RADIOLOGY STUDIES/RESULTS: VAS [name] TRANSCRANIAL DOPPLER  Result Date: 04/11/2022  Transcranial Doppler Patient Name:  SELASSIE SANTOPIETRO  Date of Exam:   04/10/2022 Medical Rec #: [id number]         Accession #:    [id number] Date of Birth: 07-03-1945         Patient  Gender: M Patient Age:   79 years Exam Location:  [facility] Procedure:      VAS [name] TRANSCRANIAL DOPPLER Referring Phys: Elwin Sleight DE LA TORRE --------------------------------------------------------------------------------  Indications: Stroke. History: Aortic valve replacement, on Coumadin. CAD, HTN, HLD, DM. Limitations: Body habitus. Comparison Study: No prior study Performing Technologist: Sharion Dove RVS  Examination Guidelines: A complete evaluation includes B-mode imaging, spectral Doppler, color Doppler, and power Doppler as needed of all accessible portions of each vessel. Bilateral testing is considered an integral part of a complete examination. Limited examinations for reoccurring indications may be performed as noted.  +----------+---------------+----------+-----------+------------------+ RIGHT TCD Right VM (cm/s)Depth (cm)Pulsatility     Comment       +----------+---------------+----------+-----------+------------------+ MCA            39.00                  1.74                       +----------+---------------+----------+-----------+------------------+ ACA           -25.00                  1.16                       +----------+---------------+----------+-----------+------------------+ Term ICA       59.00                   94                        +----------+---------------+----------+-----------+------------------+ PCA P1         51.00                  1.31                       +----------+---------------+----------+-----------+------------------+ Opthalmic      17.00                  1.71                       +----------+---------------+----------+-----------+------------------+  ICA siphon                                    unable to insonate +----------+---------------+----------+-----------+------------------+ Vertebral     -18.00                  0.93                        +----------+---------------+----------+-----------+------------------+  +----------+--------------+----------+-----------+------------------+ LEFT TCD  Left VM (cm/s)Depth (cm)Pulsatility     Comment       +----------+--------------+----------+-----------+------------------+ MCA           24.00                  0.99                       +----------+--------------+----------+-----------+------------------+ ACA                                          unable to insonate +----------+--------------+----------+-----------+------------------+ Term ICA      55.00                  1.31                       +----------+--------------+----------+-----------+------------------+ PCA P1        42.00                  1.33                       +----------+--------------+----------+-----------+------------------+ Opthalmic     21.00                   1.3                       +----------+--------------+----------+-----------+------------------+ ICA siphon    16.00                  1.50                       +----------+--------------+----------+-----------+------------------+ Vertebral                                    unable to insonate +----------+--------------+----------+-----------+------------------+  +------------+-------+------------------+             VM cm/s     Comment       +------------+-------+------------------+ Prox Basilar       unable to insonate +------------+-------+------------------+ Dist Basilar       unable to insonate +------------+-------+------------------+ Summary:  Low normal mean flow velocities in majority of identified vessels of anterior circulation. Poor suboccipital window limits evaluation of posterior circulation vessels.Elevated pulsatility indices suggest diffuse intracranial atherosclerosis. *See table(s) above for TCD measurements and observations.  Diagnosing physician: Antony Contras MD Electronically signed by Antony Contras MD  on 04/11/2022 at 10:20:53 AM.    Final    VAS US CAROTID (at [facility] and [place] only)  Result Date: 04/11/2022 Carotid Arterial Duplex Study Patient Name:  EGYPT CAMISA  Date of Exam:   04/10/2022 Medical Rec #: [id number]  Accession #:    [id number] Date of Birth: Jan 17, 1946         Patient Gender: M Patient Age:   16 years Exam Location:  [facility] Procedure:      VAS US CAROTID Referring Phys: Legrand Como NORINS --------------------------------------------------------------------------------  Indications:       CVA and Speech disturbance. Risk Factors:      Hypertension, hyperlipidemia, Diabetes. Other Factors:     Aortic valve replacement on Coumadin. Limitations        Today's exam was limited due to patient sighing throughout                    exam. Comparison Study:  Prior normal carotid duplex done 12/27/20 Performing Technologist: Sharion Dove RVS  Examination Guidelines: A complete evaluation includes B-mode imaging, spectral Doppler, color Doppler, and power Doppler as needed of all accessible portions of each vessel. Bilateral testing is considered an integral part of a complete examination. Limited examinations for reoccurring indications may be performed as noted.  Right Carotid Findings: +----------+--------+--------+--------+------------------+------------------+           PSV cm/sEDV cm/sStenosisPlaque DescriptionComments           +----------+--------+--------+--------+------------------+------------------+ CCA Prox  72      11                                intimal thickening +----------+--------+--------+--------+------------------+------------------+ CCA Distal55      14              calcific                             +----------+--------+--------+--------+------------------+------------------+ ICA Prox  57      18      1-39%   calcific                             +----------+--------+--------+--------+------------------+------------------+ ICA Mid   71       20                                                   +----------+--------+--------+--------+------------------+------------------+ ICA Distal37      8                                                    +----------+--------+--------+--------+------------------+------------------+ ECA       78      1                                                    +----------+--------+--------+--------+------------------+------------------+ +----------+--------+-------+--------+-------------------+           PSV cm/sEDV cmsDescribeArm Pressure (mmHG) +----------+--------+-------+--------+-------------------+ [id number]                                         +----------+--------+-------+--------+-------------------+ +---------+--------+--+--------+--+ VertebralPSV  cm/s32EDV cm/s12 +---------+--------+--+--------+--+  Left Carotid Findings: +----------+--------+--------+--------+------------------+--------+           PSV cm/sEDV cm/sStenosisPlaque DescriptionComments +----------+--------+--------+--------+------------------+--------+ CCA Prox  68      7               calcific                   +----------+--------+--------+--------+------------------+--------+ CCA Distal68      13              calcific                   +----------+--------+--------+--------+------------------+--------+ ICA Prox  71      18      1-39%   calcific          tortuous +----------+--------+--------+--------+------------------+--------+ ICA Mid   37      9                                          +----------+--------+--------+--------+------------------+--------+ ICA Distal105     20                                tortuous +----------+--------+--------+--------+------------------+--------+ ECA       45      8                                          +----------+--------+--------+--------+------------------+--------+  +----------+--------+--------+--------+-------------------+           PSV cm/sEDV cm/sDescribeArm Pressure (mmHG) +----------+--------+--------+--------+-------------------+ [id number]                                          +----------+--------+--------+--------+-------------------+ +---------+--------+--+--------+-+ VertebralPSV cm/s26EDV cm/s6 +---------+--------+--+--------+-+   Summary: Right Carotid: Velocities in the right ICA are consistent with a 1-39% stenosis. Left Carotid: Velocities in the left ICA are consistent with a 1-39% stenosis. Vertebrals:  Bilateral vertebral arteries demonstrate antegrade flow. Subclavians: Normal flow hemodynamics were seen in bilateral subclavian              arteries. *See table(s) above for measurements and observations.  Electronically signed by Antony Contras MD on 04/11/2022 at 10:18:41 AM.    Final    ECHOCARDIOGRAM LIMITED  Result Date: 04/10/2022    ECHOCARDIOGRAM LIMITED REPORT   Patient Name:   NEKIA NAVEJAS Date of Exam: 04/10/2022 Medical Rec #:  [id number]        Height:       69.0 in Accession #:    [id number]       Weight:       215.0 lb Date of Birth:  May 16, 1945        BSA:          2.130 m Patient Age:    73 years         BP:           139/93 mmHg Patient Gender: M                HR:           61 bpm. Exam Location:  Inpatient Procedure: Limited Echo, Cardiac Doppler and Color  Doppler Indications:    Stroke  History:        Patient has prior history of Echocardiogram examinations, most                 recent 03/14/2022. CAD, Prior Cardiac Surgery, Stroke, Aortic                 Valve Disease; Risk Factors:Diabetes, Former Smoker, Sleep Apnea                 and Hypertension.                 Aortic Valve: 23 mm St. Jude mechanical valve is present in the                 aortic position. Procedure Date: 11/12/03.  Sonographer:    Eartha Inch Referring Phys: Ennius.Cumber KENNETH C HILTY  Sonographer Comments: Technically difficult study due  to poor echo windows. Image acquisition challenging due to patient body habitus and Image acquisition challenging due to respiratory motion. IMPRESSIONS  1. Left ventricular ejection fraction, by estimation, is 60 to 65%. The left ventricle has normal function. There is mild asymmetric left ventricular hypertrophy of the basal-septal segment.  2. The mitral valve is degenerative. Trivial mitral valve regurgitation.  3. Gadients across the mechancal aortic valve not measured. The aortic valve was not well visualized. There is a 23 mm St. Jude mechanical valve present in the aortic position. Procedure Date: 11/12/03. Echo findings are consistent with normal structure  and function of the aortic valve prosthesis.  4. The inferior vena cava is normal in size with greater than 50% respiratory variability, suggesting right atrial pressure of 3 mmHg.  5. No evidence of endocarditis on limited TTE. Consider TEE if clinical suspicion for endocarditis is high. FINDINGS  Left Ventricle: Left ventricular ejection fraction, by estimation, is 60 to 65%. The left ventricle has normal function. Definity contrast agent was given IV to delineate the left ventricular endocardial borders. There is mild asymmetric left ventricular hypertrophy of the basal-septal segment. Mitral Valve: The mitral valve is degenerative in appearance. Mild mitral annular calcification. Trivial mitral valve regurgitation. Tricuspid Valve: The tricuspid valve is not well visualized. Aortic Valve: Gadients across the mechancal aortic valve not measured. The aortic valve was not well visualized. There is a 23 mm St. Jude mechanical valve present in the aortic position. Procedure Date: 11/12/03. Echo findings are consistent with normal  structure and function of the aortic valve prosthesis. Pulmonic Valve: The pulmonic valve was not well visualized. Venous: The inferior vena cava is normal in size with greater than 50% respiratory variability, suggesting right  atrial pressure of 3 mmHg. Additional Comments: Spectral Doppler performed. Color Doppler performed.  Glori Bickers MD Electronically signed by Glori Bickers MD Signature Date/Time: 04/10/2022/4:40:05 PM    Final      LOS: 2 days   Oren Binet, MD  [facility]    To contact the attending provider between 7A-7P or the covering provider during after hours 7P-7A, please log into the web site [email or web address] and access using universal [place] password for that web site. If you do not have the password, please call the hospital operator.  04/12/2022, 11:43 AM

## 2022-04-13 ENCOUNTER — Other Ambulatory Visit (HOSPITAL_COMMUNITY): Payer: Self-pay

## 2022-04-13 ENCOUNTER — Encounter: Payer: Self-pay | Admitting: Hematology & Oncology

## 2022-04-13 DIAGNOSIS — Z794 Long term (current) use of insulin: Secondary | ICD-10-CM | POA: Diagnosis not present

## 2022-04-13 DIAGNOSIS — N1831 Chronic kidney disease, stage 3a: Secondary | ICD-10-CM

## 2022-04-13 DIAGNOSIS — I639 Cerebral infarction, unspecified: Secondary | ICD-10-CM | POA: Diagnosis not present

## 2022-04-13 DIAGNOSIS — E1122 Type 2 diabetes mellitus with diabetic chronic kidney disease: Secondary | ICD-10-CM

## 2022-04-13 DIAGNOSIS — I480 Paroxysmal atrial fibrillation: Secondary | ICD-10-CM | POA: Diagnosis not present

## 2022-04-13 DIAGNOSIS — Z952 Presence of prosthetic heart valve: Secondary | ICD-10-CM | POA: Diagnosis not present

## 2022-04-13 LAB — PROTIME-INR
INR: 3.2 — ABNORMAL HIGH (ref 0.8–1.2)
Prothrombin Time: 32.5 seconds — ABNORMAL HIGH (ref 11.4–15.2)

## 2022-04-13 LAB — HEPARIN LEVEL (UNFRACTIONATED): Heparin Unfractionated: 0.33 IU/mL (ref 0.30–0.70)

## 2022-04-13 MED ORDER — PANTOPRAZOLE SODIUM 40 MG PO TBEC
40.0000 mg | DELAYED_RELEASE_TABLET | Freq: Every day | ORAL | 1 refills | Status: DC
  Filled 2022-04-13: qty 30, 30d supply, fill #0

## 2022-04-13 MED ORDER — WARFARIN SODIUM 5 MG PO TABS: 7.5000 mg | ORAL_TABLET | Freq: Once | ORAL | Status: DC

## 2022-04-13 MED ORDER — WARFARIN SODIUM 7.5 MG PO TABS: ORAL_TABLET | ORAL | 1 refills | Status: DC

## 2022-04-13 MED ORDER — ASPIRIN 81 MG PO TBEC: 81.0000 mg | DELAYED_RELEASE_TABLET | Freq: Every day | ORAL | 12 refills | Status: AC

## 2022-04-13 NOTE — Care Management Important Message (Signed)
Important Message  Patient Details  Name: [name] MRN: [id number] Date of Birth: 1945-02-24   Medicare Important Message Given:  Yes Patient left prior to IM delivery will mail copy of the IM to the patient home address.     [name] 04/13/2022, 4:20 PM

## 2022-04-13 NOTE — TOC Transition Note (Signed)
Transition of Care [facility]) - CM/SW Discharge Note   Patient Details  Name: [name] MRN: [id number] Date of Birth: [birth year]  Transition of Care [facility]) CM/SW Contact:  Levonne Lapping, RN Phone Number: 04/13/2022, 9:57 AM   Clinical Narrative:    Patient will dc to home today.  Home health will be resumed by [facility] as patient was receiving services PTA. Family will transport home    No additonal TOC needs           Patient Goals and CMS Choice      Discharge Placement                         Discharge Plan and Services Additional resources added to the After Visit Summary for                                       Social Determinants of Health (SDOH) Interventions SDOH Screenings   Food Insecurity: No Food Insecurity (02/10/2022)  Housing: Low Risk  (02/10/2022)  Transportation Needs: No Transportation Needs (02/10/2022)  Utilities: Not At Risk (02/10/2022)  Depression (PHQ2-9): Low Risk  (01/06/2022)  Tobacco Use: High Risk (04/09/2022)     Readmission Risk Interventions     No data to display

## 2022-04-13 NOTE — Discharge Summary (Signed)
PATIENT DETAILS Name: [name] Age: 77 y.o. Sex: male Date of Birth: 1945/06/10 MRN: [id number]. Admitting Physician: Neena Rhymes, MD PCP:O'Sullivan, Lenna Sciara, NP  Admit Date: 04/09/2022 Discharge date: 04/13/2022  Recommendations for Outpatient Follow-up:  Follow up with PCP in 1-2 weeks Please obtain CMP/CBC in one week  Admitted From:  Home  Disposition: Home   Discharge Condition: good  CODE STATUS:   Code Status: Full Code   Diet recommendation:  Diet Order             Diet - low sodium heart healthy           Diet Carb Modified           Diet heart healthy/carb modified Room service appropriate? Yes; Fluid consistency: Thin  Diet effective now                    Brief Summary: Patient is a 77 y.o.  male with history of CAD s/p CABG, aortic valve replacement (mechanical valve) on coumadin, PAF, prior CVA's, HLD, HTN, CKD stage II, OSA on CPAP-who presented with transient aphasia-upon further evaluation-patient was found to have acute CVA in the setting of subtherapeutic INR.   Significant events: 3/10>> admit to [facility]   Significant studies: 3/10>> MRI brain: Acute infarct left frontal operculum 3/11>> echo: EF 60-65%, normal structure/function of aortic valve prosthesis. 3/11>> carotid Doppler: No significant stenosis 3/11>A1C:7.3 3/11>>LDL: 56   Significant microbiology data: None   Procedures: None   Consults: Neurology Cardiology  Brief Hospital Course: Expressive Aphasia 2/2 Acute Embolic CVA due to subtherapeutic INR in the setting of PAF/mechanical aortic valve replacement Speech improved-almost back to baseline Work up as above After discussion with cardiology/neurology-new target INR between 3-3.5.  Was placed on IV heparin until INR was therapeutic.  Discussed with pharmacy team, recommendations are for Coumadin 3.75 mg on Monday/Friday, and 7.5 mg rest of the days.   Patient/family trying to get a appointment at the  Coumadin clinic in the next few days for close monitoring Note-okay to stop 6-but to continue aspirin.   History of prior CVAs-most recent had CVA secondary to left MCA occlusion-s/p thrombectomy December 2023   History of CAD s/p CABG No anginal symptoms   Aortic valve replacement-mechanical valve Was on overlapping IV heparin-Coumadin until INR at goal (3-3.5)-see above.  INR therapeutic at 3.2 at the time of discharge.   PAF Sinus rhythm Continue Toprol-XL Overlapping IV heparin-Coumadin until INR at goal (3-3.5)   HLD LDL at goal Continue statin/fenofibrate   HTN BP stable Continue amlodipine, Imdur, Toprol, furosemide   DM-2 with hyperglycemia CBG stable Continue Farxiga, Semglee 22 units daily and scheduled Premeal NovoLog per home regimen  Chronic urinary retention BPH Foley catheter recently removed by Dr. Diona Fanti as outpatient-now getting intermittent intermittent in/out catheterizations Continue Flomax   CKD stage 2 At baseline   Obesity: Estimated body mass index is 31.75 kg/m as calculated from the following:   Height as of this encounter: '5\' 9"'$  (1.753 m).   Weight as of this encounter: 97.5 kg.   Discharge Diagnoses:  Active Problems:   Acute CVA (cerebrovascular accident) ([place])   History of aortic valve replacement   Long term (current) use of anticoagulants   Diabetes mellitus ([place])   Essential hypertension   Urinary retention   Discharge Instructions:  Activity:  As tolerated   Discharge Instructions     Diet - low sodium heart healthy   Complete by: As directed  Diet Carb Modified   Complete by: As directed    Discharge instructions   Complete by: As directed    Follow with Primary MD  [name], NP in 1-2 weeks  Please follow-up with the Coumadin clinic in the next 1-5 days for repeat INR check.  Please get a complete blood count and chemistry panel checked by your Primary MD at your next visit, and again as  instructed by your Primary MD.  Get Medicines reviewed and adjusted: Please take all your medications with you for your next visit with your Primary MD  Laboratory/radiological data: Please request your Primary MD to go over all hospital tests and procedure/radiological results at the follow up, please ask your Primary MD to get all Hospital records sent to his/her office.  In some cases, they will be blood work, cultures and biopsy results pending at the time of your discharge. Please request that your primary care M.D. follows up on these results.  Also Note the following: If you experience worsening of your admission symptoms, develop shortness of breath, life threatening emergency, suicidal or homicidal thoughts you must seek medical attention immediately by calling 911 or calling your MD immediately  if symptoms less severe.  You must read complete instructions/literature along with all the possible adverse reactions/side effects for all the Medicines you take and that have been prescribed to you. Take any new Medicines after you have completely understood and accpet all the possible adverse reactions/side effects.   Do not drive when taking Pain medications or sleeping medications (Benzodaizepines)  Do not take more than prescribed Pain, Sleep and Anxiety Medications. It is not advisable to combine anxiety,sleep and pain medications without talking with your primary care practitioner  Special Instructions: If you have smoked or chewed Tobacco  in the last 2 yrs please stop smoking, stop any regular Alcohol  and or any Recreational drug use.  Wear Seat belts while driving.  Please note: You were cared for by a hospitalist during your hospital stay. Once you are discharged, your primary care physician will handle any further medical issues. Please note that NO REFILLS for any discharge medications will be authorized once you are discharged, as it is imperative that you return to your  primary care physician (or establish a relationship with a primary care physician if you do not have one) for your post hospital discharge needs so that they can reassess your need for medications and monitor your lab values.   Increase activity slowly   Complete by: As directed       Allergies as of 04/13/2022   No Known Allergies      Medication List     STOP taking these medications    clopidogrel 75 MG tablet Commonly known as: PLAVIX       TAKE these medications    AMBULATORY NON FORMULARY MEDICATION cpap cushions            AirFit F20 (Size: Large)           AirFit F30 (Size: Med)   Please FAX the prescription to:  [phone]   amLODipine 5 MG tablet Commonly known as: NORVASC TAKE 1 TABLET (5 MG TOTAL) BY MOUTH DAILY. What changed: when to take this   aspirin EC 81 MG tablet Take 1 tablet (81 mg total) by mouth daily. Swallow whole. Start taking on: April 14, 2022   Basaglar KwikPen 100 UNIT/ML Inject 22 Units into the skin daily. What changed: when to take this  betamethasone dipropionate 0.05 % cream Apply topically 2 (two) times daily as needed. To eczema rash What changed:  how much to take reasons to take this additional instructions   Blood Glucose Monitoring Suppl Supplies Misc Use for monitoring glucose level   colchicine 0.6 MG tablet TAKE 1 TABLET (0.6 MG TOTAL) BY MOUTH 2 (TWO) TIMES DAILY AS NEEDED.   dapagliflozin propanediol 10 MG Tabs tablet Commonly known as: Farxiga Take 1 tablet (10 mg total) by mouth daily before breakfast.   Dexcom G6 Sensor Misc 1 Device by Does not apply route as directed.   erythromycin ophthalmic ointment 1 Application See admin instructions. Apply to affected eye(s) as directed for styes   fenofibrate 145 MG tablet Commonly known as: TRICOR TAKE 1 TABLET BY MOUTH EVERY DAY What changed: when to take this   furosemide 20 MG tablet Commonly known as: LASIX TAKE 1 TABLET BY MOUTH EVERY DAY AS  NEEDED FOR SWELLING What changed: See the new instructions.   Insulin Pen Needle 32G X 4 MM Misc 1 Device by Does not apply route in the morning, at noon, in the evening, and at bedtime.   isosorbide mononitrate 30 MG 24 hr tablet Commonly known as: IMDUR Take 30 mg by mouth daily.   ketoconazole 2 % cream Commonly known as: NIZORAL Apply 1 Application topically daily as needed for irritation.   metoprolol succinate 25 MG 24 hr tablet Commonly known as: TOPROL-XL TAKE 1 TABLET BY MOUTH EVERY DAY What changed: when to take this   NovoLOG FlexPen 100 UNIT/ML FlexPen Generic drug: insulin aspart Max daily 75 units What changed:  how much to take how to take this when to take this additional instructions   OneTouch Delica Lancets [id number] Misc USE AS DIRECTED   OneTouch Verio test strip Generic drug: glucose blood Use as instructed   pantoprazole 40 MG tablet Commonly known as: Protonix Take 1 tablet (40 mg total) by mouth daily.   PRESCRIPTION MEDICATION CPAP- At bedtime   rosuvastatin 40 MG tablet Commonly known as: CRESTOR TAKE 1 TABLET BY MOUTH EVERY DAY What changed: when to take this   tacrolimus 0.1 % ointment Commonly known as: PROTOPIC Apply 1 application  topically 2 (two) times daily as needed (for eczema).   tamsulosin 0.4 MG Caps capsule Commonly known as: FLOMAX Take 1 capsule (0.4 mg total) by mouth daily.   warfarin 7.5 MG tablet Commonly known as: COUMADIN Take as directed. If you are unsure how to take this medication, talk to your nurse or doctor. Original instructions: 3.75 mg on Monday/Friday and 7.5 mg the rest of the days. What changed: See the new instructions.        Follow-up Information     [name], NP. Schedule an appointment as soon as possible for a visit.   Specialty: Internal Medicine Contact information: [address] [place] [address] [phone]         [place]  Office. Schedule an appointment as soon as possible for a visit in 1 day(s).   Specialty: Cardiology Contact information: [address], [place] [address] [phone]               No Known Allergies   Other Procedures/Studies: VAS [name] TRANSCRANIAL DOPPLER  Result Date: 04/11/2022  Transcranial Doppler Patient Name:  [name]  Date of Exam:   04/10/2022 Medical Rec #: [id number]  Accession #:    [id number] Date of Birth: 1945-05-07         Patient Gender: M Patient Age:   33 years Exam Location:  [facility] Procedure:      VAS [name] TRANSCRANIAL DOPPLER Referring Phys: Elwin Sleight [place] --------------------------------------------------------------------------------  Indications: Stroke. History: Aortic valve replacement, on Coumadin. CAD, HTN, HLD, DM. Limitations: Body habitus. Comparison Study: No prior study Performing Technologist: Sharion Dove RVS  Examination Guidelines: A complete evaluation includes B-mode imaging, spectral Doppler, color Doppler, and power Doppler as needed of all accessible portions of each vessel. Bilateral testing is considered an integral part of a complete examination. Limited examinations for reoccurring indications may be performed as noted.  +----------+---------------+----------+-----------+------------------+ RIGHT TCD Right VM (cm/s)Depth (cm)Pulsatility     Comment       +----------+---------------+----------+-----------+------------------+ MCA            39.00                  1.74                       +----------+---------------+----------+-----------+------------------+ ACA           -25.00                  1.16                       +----------+---------------+----------+-----------+------------------+ Term ICA       59.00                   94                        +----------+---------------+----------+-----------+------------------+ PCA P1         51.00                   1.31                       +----------+---------------+----------+-----------+------------------+ Opthalmic      17.00                  1.71                       +----------+---------------+----------+-----------+------------------+ ICA siphon                                    unable to insonate +----------+---------------+----------+-----------+------------------+ Vertebral     -18.00                  0.93                       +----------+---------------+----------+-----------+------------------+  +----------+--------------+----------+-----------+------------------+ LEFT TCD  Left VM (cm/s)Depth (cm)Pulsatility     Comment       +----------+--------------+----------+-----------+------------------+ MCA           24.00                  0.99                       +----------+--------------+----------+-----------+------------------+ ACA  unable to insonate +----------+--------------+----------+-----------+------------------+ Term ICA      55.00                  1.31                       +----------+--------------+----------+-----------+------------------+ PCA P1        42.00                  1.33                       +----------+--------------+----------+-----------+------------------+ Opthalmic     21.00                   1.3                       +----------+--------------+----------+-----------+------------------+ ICA siphon    16.00                  1.50                       +----------+--------------+----------+-----------+------------------+ Vertebral                                    unable to insonate +----------+--------------+----------+-----------+------------------+  +------------+-------+------------------+             VM cm/s     Comment       +------------+-------+------------------+ Prox Basilar       unable to insonate +------------+-------+------------------+ Dist Basilar        unable to insonate +------------+-------+------------------+ Summary:  Low normal mean flow velocities in majority of identified vessels of anterior circulation. Poor suboccipital window limits evaluation of posterior circulation vessels.Elevated pulsatility indices suggest diffuse intracranial atherosclerosis. *See table(s) above for TCD measurements and observations.  Diagnosing physician: Antony Contras MD Electronically signed by Antony Contras MD on 04/11/2022 at 10:20:53 AM.    Final    VAS US CAROTID (at [facility] and [place] only)  Result Date: 04/11/2022 Carotid Arterial Duplex Study Patient Name:  [name]  Date of Exam:   04/10/2022 Medical Rec #: [id number]         Accession #:    [id number] Date of Birth: May 31, 1945         Patient Gender: M Patient Age:   63 years Exam Location:  [facility] Procedure:      VAS US CAROTID Referring Phys: Adella Hare --------------------------------------------------------------------------------  Indications:       CVA and Speech disturbance. Risk Factors:      Hypertension, hyperlipidemia, Diabetes. Other Factors:     Aortic valve replacement on Coumadin. Limitations        Today's exam was limited due to patient sighing throughout                    exam. Comparison Study:  Prior normal carotid duplex done 12/27/20 Performing Technologist: Sharion Dove RVS  Examination Guidelines: A complete evaluation includes B-mode imaging, spectral Doppler, color Doppler, and power Doppler as needed of all accessible portions of each vessel. Bilateral testing is considered an integral part of a complete examination. Limited examinations for reoccurring indications may be performed as noted.  Right Carotid Findings: +----------+--------+--------+--------+------------------+------------------+           PSV cm/sEDV cm/sStenosisPlaque DescriptionComments           +----------+--------+--------+--------+------------------+------------------+  CCA Prox  72      11                                 intimal thickening +----------+--------+--------+--------+------------------+------------------+ CCA Distal55      14              calcific                             +----------+--------+--------+--------+------------------+------------------+ ICA Prox  57      18      1-39%   calcific                             +----------+--------+--------+--------+------------------+------------------+ ICA Mid   71      20                                                   +----------+--------+--------+--------+------------------+------------------+ ICA Distal37      8                                                    +----------+--------+--------+--------+------------------+------------------+ ECA       78      1                                                    +----------+--------+--------+--------+------------------+------------------+ +----------+--------+-------+--------+-------------------+           PSV cm/sEDV cmsDescribeArm Pressure (mmHG) +----------+--------+-------+--------+-------------------+ [id number]                                         +----------+--------+-------+--------+-------------------+ +---------+--------+--+--------+--+ VertebralPSV cm/s32EDV cm/s12 +---------+--------+--+--------+--+  Left Carotid Findings: +----------+--------+--------+--------+------------------+--------+           PSV cm/sEDV cm/sStenosisPlaque DescriptionComments +----------+--------+--------+--------+------------------+--------+ CCA Prox  68      7               calcific                   +----------+--------+--------+--------+------------------+--------+ CCA Distal68      13              calcific                   +----------+--------+--------+--------+------------------+--------+ ICA Prox  71      18      1-39%   calcific          tortuous +----------+--------+--------+--------+------------------+--------+  ICA Mid   37      9                                          +----------+--------+--------+--------+------------------+--------+ ICA Distal105     20  tortuous +----------+--------+--------+--------+------------------+--------+ ECA       45      8                                          +----------+--------+--------+--------+------------------+--------+ +----------+--------+--------+--------+-------------------+           PSV cm/sEDV cm/sDescribeArm Pressure (mmHG) +----------+--------+--------+--------+-------------------+ [id number]                                          +----------+--------+--------+--------+-------------------+ +---------+--------+--+--------+-+ VertebralPSV cm/s26EDV cm/s6 +---------+--------+--+--------+-+   Summary: Right Carotid: Velocities in the right ICA are consistent with a 1-39% stenosis. Left Carotid: Velocities in the left ICA are consistent with a 1-39% stenosis. Vertebrals:  Bilateral vertebral arteries demonstrate antegrade flow. Subclavians: Normal flow hemodynamics were seen in bilateral subclavian              arteries. *See table(s) above for measurements and observations.  Electronically signed by Antony Contras MD on 04/11/2022 at 10:18:41 AM.    Final    ECHOCARDIOGRAM LIMITED  Result Date: 04/10/2022    ECHOCARDIOGRAM LIMITED REPORT   Patient Name:   [name] Date of Exam: 04/10/2022 Medical Rec #:  [id number]        Height:       69.0 in Accession #:    [id number]       Weight:       215.0 lb Date of Birth:  [birth year]        BSA:          2.130 m Patient Age:    66 years         BP:           139/93 mmHg Patient Gender: M                HR:           61 bpm. Exam Location:  Inpatient Procedure: Limited Echo, Cardiac Doppler and Color Doppler Indications:    Stroke  History:        Patient has prior history of Echocardiogram examinations, most                 recent 03/14/2022. CAD, Prior  Cardiac Surgery, Stroke, Aortic                 Valve Disease; Risk Factors:Diabetes, Former Smoker, Sleep Apnea                 and Hypertension.                 Aortic Valve: 23 mm St. Jude mechanical valve is present in the                 aortic position. Procedure Date: 11/12/03.  Sonographer:    Eartha Inch Referring Phys: Ennius.Cumber [name]  Sonographer Comments: Technically difficult study due to poor echo windows. Image acquisition challenging due to patient body habitus and Image acquisition challenging due to respiratory motion. IMPRESSIONS  1. Left ventricular ejection fraction, by estimation, is 60 to 65%. The left ventricle has normal function. There is mild asymmetric left ventricular hypertrophy of the basal-septal segment.  2. The mitral valve is degenerative. Trivial mitral valve regurgitation.  3. Gadients across the mechancal aortic valve not measured.  The aortic valve was not well visualized. There is a 23 mm St. Jude mechanical valve present in the aortic position. Procedure Date: 11/12/03. Echo findings are consistent with normal structure  and function of the aortic valve prosthesis.  4. The inferior vena cava is normal in size with greater than 50% respiratory variability, suggesting right atrial pressure of 3 mmHg.  5. No evidence of endocarditis on limited TTE. Consider TEE if clinical suspicion for endocarditis is high. FINDINGS  Left Ventricle: Left ventricular ejection fraction, by estimation, is 60 to 65%. The left ventricle has normal function. Definity contrast agent was given IV to delineate the left ventricular endocardial borders. There is mild asymmetric left ventricular hypertrophy of the basal-septal segment. Mitral Valve: The mitral valve is degenerative in appearance. Mild mitral annular calcification. Trivial mitral valve regurgitation. Tricuspid Valve: The tricuspid valve is not well visualized. Aortic Valve: Gadients across the mechancal aortic valve not measured.  The aortic valve was not well visualized. There is a 23 mm St. Jude mechanical valve present in the aortic position. Procedure Date: 11/12/03. Echo findings are consistent with normal  structure and function of the aortic valve prosthesis. Pulmonic Valve: The pulmonic valve was not well visualized. Venous: The inferior vena cava is normal in size with greater than 50% respiratory variability, suggesting right atrial pressure of 3 mmHg. Additional Comments: Spectral Doppler performed. Color Doppler performed.  Glori Bickers MD Electronically signed by Glori Bickers MD Signature Date/Time: 04/10/2022/4:40:05 PM    Final    MR BRAIN WO CONTRAST  Result Date: 04/09/2022 CLINICAL DATA:  77 year old male with acute neurologic deficit primarily abnormal speech, now clinically resolved. Chronic left MCA and cerebellar infarcts. EXAM: MRI HEAD WITHOUT CONTRAST TECHNIQUE: Multiplanar, multiecho pulse sequences of the brain and surrounding structures were obtained without intravenous contrast. COMPARISON:  Head CT earlier today.  Brain MRI 01/25/2022. FINDINGS: Brain: Small area of left frontal operculum restricted diffusion superimposed on underlying chronic left middle MCA territory encephalomalacia. See series 5, image 86 and series 8, image 22. No significant cytotoxic edema. Adjacent encephalomalacia and laminar necrosis with mild if any hemosiderin. Intrinsic T1 hyperintensity has developed throughout the left lentiform, which appears stable on T2 and FLAIR. Questionable increased mineralization on SWI. No other restricted diffusion. Stable chronic bilateral cerebellar infarcts. Stable gray and white matter signal otherwise. Small chronic microhemorrhages in the periventricular white matter at the left frontal horn, right occipital horn are stable on SWI. No midline shift, mass effect, evidence of mass lesion, ventriculomegaly, extra-axial collection or acute intracranial hemorrhage. Cervicomedullary junction  and pituitary are within normal limits. Vascular: Major intracranial vascular flow voids are stable since last year. Skull and upper cervical spine: Stable visible cervical spine with chronic ligamentous hypertrophy about the odontoid. Visualized bone marrow signal is within normal limits. Sinuses/Orbits: Stable and negative orbits. Stable mild paranasal sinus mucosal thickening. Other: Mastoids remain clear. Grossly normal visible internal auditory structures. IMPRESSION: 1. Positive for a tiny acute cortical infarct of the left frontal operculum, within an area of chronic Left MCA middle division infarct with encephalomalacia and laminar necrosis. No acute hemorrhage or mass effect. 2. Conspicuous increased intrinsic T1 signal in the left lentiform nuclei since December. Nonspecific, but favor this is a form of Wallerian degeneration secondary to the chronic ischemia in #1. 3. Elsewhere stable noncontrast MRI appearance of the brain since last year., including chronic bilateral cerebellar infarcts and occasional punctate chronic microhemorrhages. Electronically Signed   By: Genevie Ann M.D.   On:  04/09/2022 08:58   CT HEAD WO CONTRAST  Result Date: 04/09/2022 CLINICAL DATA:  77 year old male with acute neurologic deficit. Left MCA middle division infarct last year. EXAM: CT HEAD WITHOUT CONTRAST TECHNIQUE: Contiguous axial images were obtained from the base of the skull through the vertex without intravenous contrast. RADIATION DOSE REDUCTION: This exam was performed according to the departmental dose-optimization program which includes automated exposure control, adjustment of the mA and/or kV according to patient size and/or use of iterative reconstruction technique. COMPARISON:  Brain MRI 06/25/2021, CT head and CTA 06/24/2021. FINDINGS: Brain: Encephalomalacia corresponding to previous left MCA middle division infarct. Stable cerebral volume. No midline shift, ventriculomegaly, mass effect, evidence of mass  lesion, intracranial hemorrhage or evidence of cortically based acute infarction. Small chronic bilateral cerebellar infarcts are stable from the MRI last year. Vascular: Severe Calcified atherosclerosis at the skull base. No suspicious intracranial vascular hyperdensity. Skull: No acute osseous abnormality identified. Partially visible chronic dental periapical lucency. Sinuses/Orbits: Generally mild paranasal sinus mucosal thickening is stable. Tympanic cavities and mastoids remain aerated. Other: Visualized orbits and scalp soft tissues are within normal limits. IMPRESSION: 1. No acute intracranial abnormality. 2. Chronic left MCA and cerebellar infarcts. Electronically Signed   By: Genevie Ann M.D.   On: 04/09/2022 07:26   ECHO TEE  Result Date: 03/16/2022    TRANSESOPHOGEAL ECHO REPORT   Patient Name:   [name] Date of Exam: 03/14/2022 Medical Rec #:  [id number]        Height:       69.0 in Accession #:    [id number]       Weight:       213.0 lb Date of Birth:  [birth year]        BSA:          2.122 m Patient Age:    52 years         BP:           137/68 mmHg Patient Gender: M                HR:           54 bpm. Exam Location:  Inpatient Procedure: Transesophageal Echo, Cardiac Doppler, Color Doppler and 3D Echo Indications:     H/O: CVA (cerebrovascular accident) [Z86.73 (ICD-10-CM)]  History:         Patient has prior history of Echocardiogram examinations, most                  recent 01/24/2022. CAD, Prior CABG, Stroke, Aortic Valve                  Disease; Risk Factors:Former Smoker, Diabetes, Sleep Apnea and                  Hypertension.                  Aortic Valve: 23 mm St Jude Regent mechanical valve is present                  in the aortic position. Procedure Date: 11/12/2003.  Sonographer:     Darlina Sicilian RDCS Referring Phys:  Kirk Ruths MD Diagnosing Phys: Cherlynn Kaiser MD PROCEDURE: After discussion of the risks and benefits of a TEE, an informed consent was obtained from the  patient. TEE procedure time was 42 minutes. The transesophogeal probe was passed without difficulty through the esophogus of the patient. Imaged were obtained with the patient in  a left lateral decubitus position. Local oropharyngeal anesthetic was provided with Cetacaine. Sedation performed by different physician. The patient was monitored while under deep sedation. Image quality was good. The patient's vital signs; including heart rate, blood pressure, and oxygen saturation; remained stable throughout the procedure. The patient developed no complications during the procedure. Operative note reviewed.  IMPRESSIONS  1. Left ventricular ejection fraction, by estimation, is 55 to 60%. The left ventricle has normal function. There is moderate asymmetric left ventricular hypertrophy of the basal-septal segment, with flow acceleration at the basal septum, with no significant LVOT obstruction demonstrated. There is pseudodyskinesis of inferior wall.  2. Right ventricular systolic function is normal. The right ventricular size is normal.  3. No left atrial appendage identified. Recent CT imaging reviewed, possible surgical ligation. Left atrial size was mildly dilated. No left atrial/left atrial appendage thrombus was detected.  4. Right atrial size was mildly dilated.  5. Chordal systolic anterior motion of mitral apparatus. The mitral valve is degenerative. Mild mitral valve regurgitation. No evidence of mitral stenosis.  6. The aortic valve has been repaired/replaced. Aortic valve regurgitation is trivial (normal washing jet). There is a 23 mm St Jude Regent mechanical valve present in the aortic position. Procedure Date: 11/12/2003. Aortic valve area, by VTI measures 3.96 cm. Aortic valve mean gradient measures 10.4 mmHg. Aortic valve Vmax measures 1.97 m/s. Aortic valve acceleration time measures 109 msec. Normal function of mechanical aortic valve prosthesis.  7. Aortic dilatation noted. Aneurysm of the ascending  aorta, measuring 45 mm. There is moderate dilatation of the ascending aorta. There is Moderate (Grade III) atheroma plaque involving the aortic arch and descending aorta.  8. Negative interatrial shunt, possible tiny intrapulmonary shunt with possible bubbles after 6 cardiac cycles. FINDINGS  Left Ventricle: Left ventricular ejection fraction, by estimation, is 55 to 60%. The left ventricle has normal function. The left ventricular internal cavity size was normal in size. There is moderate asymmetric left ventricular hypertrophy of the basal-septal segment. Pseudodyskinesis of inferior wall. Right Ventricle: The right ventricular size is normal. No increase in right ventricular wall thickness. Right ventricular systolic function is normal. Left Atrium: No left atrial appendage identified. Recent CT imaging reviewed, possible surgical ligation. Left atrial size was mildly dilated. No left atrial/left atrial appendage thrombus was detected. Right Atrium: Right atrial size was mildly dilated. Pericardium: Trivial pericardial effusion is present. Mitral Valve: Chordal systolic anterior motion of mitral apparatus. The mitral valve is degenerative in appearance. Mild mitral annular calcification. Mild mitral valve regurgitation. No evidence of mitral valve stenosis. MV peak gradient, 1.6 mmHg. The mean mitral valve gradient is 1.8 mmHg with average heart rate of 55 bpm. Tricuspid Valve: The tricuspid valve is normal in structure. Tricuspid valve regurgitation is mild. Aortic Valve: The aortic valve has been repaired/replaced. Aortic valve regurgitation is trivial. Aortic regurgitation PHT measures 356 msec. Aortic valve mean gradient measures 10.4 mmHg. Aortic valve peak gradient measures 15.5 mmHg. Aortic valve area,  by VTI measures 3.96 cm. There is a 23 mm St Jude Regent mechanical valve present in the aortic position. Procedure Date: 11/12/2003. Echo findings are consistent with normal structure and function of the  aortic valve prosthesis. Pulmonic Valve: The pulmonic valve was grossly normal. Pulmonic valve regurgitation is not visualized. Aorta: Aortic dilatation noted. There is moderate dilatation of the ascending aorta. There is an aneurysm involving the ascending aorta measuring 45 mm. There is moderate (Grade III) atheroma plaque involving the aortic arch and descending aorta. IAS/Shunts:  No atrial level shunt detected by color flow Doppler. Agitated saline contrast was given intravenously to evaluate for intracardiac shunting. Negative interatrial shunt, possible tiny intrapulmonary shunt with possible bubbles after 6 cardiac  cycles. Additional Comments: Spectral Doppler performed. LEFT VENTRICLE PLAX 2D LV IVS:        1.31 cm LVOT diam:     2.20 cm LV SV:         176 LV SV Index:   83 LVOT Area:     3.80 cm  AORTIC VALVE AV Area (Vmax):    3.99 cm AV Area (Vmean):   3.58 cm AV Area (VTI):     3.96 cm AV Vmax:           197.09 cm/s AV Vmean:          157.100 cm/s AV VTI:            0.445 m AV Peak Grad:      15.5 mmHg AV Mean Grad:      10.4 mmHg LVOT Vmax:         207.00 cm/s LVOT Vmean:        148.000 cm/s LVOT VTI:          0.464 m LVOT/AV VTI ratio: 1.04 AI PHT:            356 msec  AORTA Ao Root diam: 3.90 cm Ao Asc diam:  4.50 cm MITRAL VALVE                  TRICUSPID VALVE MV Area VTI:  8.73 cm        TR Peak grad:   21.9 mmHg MV Peak grad: 1.6 mmHg        TR Vmax:        234.00 cm/s MV Mean grad: 1.8 mmHg MV Vmax:      0.63 m/s        SHUNTS MV Vmean:     29.6 cm/s       Systemic VTI:  0.46 m MR Peak grad:    62.4 mmHg    Systemic Diam: 2.20 cm MR Mean grad:    36.0 mmHg MR Vmax:         395.00 cm/s MR Vmean:        285.0 cm/s MR PISA:         1.01 cm MR PISA Eff ROA: 9 mm MR PISA Radius:  0.40 cm Cherlynn Kaiser MD Electronically signed by Cherlynn Kaiser MD Signature Date/Time: 03/16/2022/8:41:31 PM    Final      TODAY-DAY OF DISCHARGE:  Subjective:   [name] today has no headache,no  chest abdominal pain,no new weakness tingling or numbness, feels much better wants to go home today.   Objective:   Blood pressure 129/71, pulse 65, temperature 98.2 F (36.8 C), temperature source Oral, resp. rate 17, height '5\' 9"'$  (1.753 m), weight 97.5 kg, SpO2 95 %.  Intake/Output Summary (Last 24 hours) at 04/13/2022 0947 Last data filed at 04/13/2022 0530 Gross per 24 hour  Intake 744.6 ml  Output 1900 ml  Net -1155.4 ml   Filed Weights   04/09/22 0614  Weight: 97.5 kg    Exam: Awake Alert, Oriented *3, No new F.N deficits, Normal affect [place].AT,PERRAL Supple Neck,No JVD, No cervical lymphadenopathy appriciated.  Symmetrical Chest wall movement, Good air movement bilaterally, CTAB RRR,No Gallops,Rubs or new Murmurs, No Parasternal Heave +ve B.Sounds, Abd Soft, Non tender, No organomegaly appriciated, No rebound -guarding or  rigidity. No Cyanosis, Clubbing or edema, No new Rash or bruise   PERTINENT RADIOLOGIC STUDIES: No results found.   PERTINENT LAB RESULTS: CBC: Recent Labs    04/11/22 0341 04/12/22 0324  WBC 7.4 8.2  HGB 12.6* 14.4  HCT 38.2* 42.5  PLT 232 246   CMET CMP     Component Value Date/Time   NA 133 (L) 04/12/2022 0324   NA 137 03/07/2022 1031   NA 138 09/23/2015 1020   K 3.8 04/12/2022 0324   K 4.3 03/30/2016 1507   K 4.4 09/23/2015 1020   CL 101 04/12/2022 0324   CL 99 03/30/2016 1507   CO2 19 (L) 04/12/2022 0324   CO2 25 03/30/2016 1507   CO2 25 09/23/2015 1020   GLUCOSE 139 (H) 04/12/2022 0324   GLUCOSE 128 09/23/2015 1020   BUN 26 (H) 04/12/2022 0324   BUN 28 (H) 03/07/2022 1031   BUN 28.4 (H) 09/23/2015 1020   CREATININE 1.38 (H) 04/12/2022 0324   CREATININE 1.63 (H) 09/22/2019 1119   CREATININE 1.3 09/23/2015 1020   CALCIUM 9.1 04/12/2022 0324   CALCIUM 10.1 03/30/2016 1507   CALCIUM 9.5 09/23/2015 1020   PROT 6.8 04/09/2022 0630   PROT 7.2 03/24/2021 0815   PROT 7.4 09/23/2015 1020   ALBUMIN 3.5 04/09/2022 0630   ALBUMIN  4.2 03/24/2021 0815   ALBUMIN 4.8 03/30/2016 1507   ALBUMIN 3.8 09/23/2015 1020   AST 23 04/09/2022 0630   AST 25 04/10/2019 1300   AST 41 (H) 09/23/2015 1020   ALT 15 04/09/2022 0630   ALT 20 04/10/2019 1300   ALT 29 09/23/2015 1020   ALKPHOS 41 04/09/2022 0630   ALKPHOS 45 03/30/2016 1507   ALKPHOS 46 09/23/2015 1020   BILITOT 0.6 04/09/2022 0630   BILITOT 0.3 03/24/2021 0815   BILITOT 0.5 04/10/2019 1300   BILITOT 0.46 09/23/2015 1020   GFRNONAA 53 (L) 04/12/2022 0324   GFRNONAA 39 (L) 04/10/2019 1300   GFRNONAA 63 05/31/2012 0818   GFRAA 45 (L) 04/10/2019 1300   GFRAA 73 05/31/2012 0818    GFR Estimated Creatinine Clearance: 51.6 mL/min (A) (by C-G formula based on SCr of 1.38 mg/dL (H)). No results for input(s): "LIPASE", "AMYLASE" in the last 72 hours. No results for input(s): "CKTOTAL", "CKMB", "CKMBINDEX", "TROPONINI" in the last 72 hours. Invalid input(s): "POCBNP" No results for input(s): "DDIMER" in the last 72 hours. No results for input(s): "HGBA1C" in the last 72 hours. No results for input(s): "CHOL", "HDL", "LDLCALC", "TRIG", "CHOLHDL", "LDLDIRECT" in the last 72 hours. No results for input(s): "TSH", "T4TOTAL", "T3FREE", "THYROIDAB" in the last 72 hours.  Invalid input(s): "FREET3" No results for input(s): "VITAMINB12", "FOLATE", "FERRITIN", "TIBC", "IRON", "RETICCTPCT" in the last 72 hours. Coags: Recent Labs    04/12/22 0324 04/13/22 0236  INR 2.6* 3.2*   Microbiology: No results found for this or any previous visit (from the past 240 hour(s)).  FURTHER DISCHARGE INSTRUCTIONS:  Get Medicines reviewed and adjusted: Please take all your medications with you for your next visit with your Primary MD  Laboratory/radiological data: Please request your Primary MD to go over all hospital tests and procedure/radiological results at the follow up, please ask your Primary MD to get all Hospital records sent to his/her office.  In some cases, they will be  blood work, cultures and biopsy results pending at the time of your discharge. Please request that your primary care M.D. goes through all the records of your hospital data and follows  up on these results.  Also Note the following: If you experience worsening of your admission symptoms, develop shortness of breath, life threatening emergency, suicidal or homicidal thoughts you must seek medical attention immediately by calling 911 or calling your MD immediately  if symptoms less severe.  You must read complete instructions/literature along with all the possible adverse reactions/side effects for all the Medicines you take and that have been prescribed to you. Take any new Medicines after you have completely understood and accpet all the possible adverse reactions/side effects.   Do not drive when taking Pain medications or sleeping medications (Benzodaizepines)  Do not take more than prescribed Pain, Sleep and Anxiety Medications. It is not advisable to combine anxiety,sleep and pain medications without talking with your primary care practitioner  Special Instructions: If you have smoked or chewed Tobacco  in the last 2 yrs please stop smoking, stop any regular Alcohol  and or any Recreational drug use.  Wear Seat belts while driving.  Please note: You were cared for by a hospitalist during your hospital stay. Once you are discharged, your primary care physician will handle any further medical issues. Please note that NO REFILLS for any discharge medications will be authorized once you are discharged, as it is imperative that you return to your primary care physician (or establish a relationship with a primary care physician if you do not have one) for your post hospital discharge needs so that they can reassess your need for medications and monitor your lab values.  Total Time spent coordinating discharge including counseling, education and face to face time equals greater than 30  minutes.  SignedOren Binet 04/13/2022 9:47 AM

## 2022-04-13 NOTE — Consult Note (Signed)
   [facility] Inpatient Consult   04/13/2022  ERIKA HUSSAR 1945-10-21 [id number]  [place] Organization [ACO] Patient: [place]alling Medicare  Primary Care Provider:  [name], NP with [place] [facility] is listed to provide the transition of care follow up.   Patient is currently active with [facility] for chronic disease management services.  Patient has been engaged by a [place] .  Our community based plan of care has focused on disease management and community care coordination resource support.    Outreach to patient to follow up for post hospital needs, patient has transitioned home and to restart with [facility]. Patient will receive a post hospital call and will be evaluated for assessments and disease process education.    Plan:  Will reach out to collaborate with [place] and update on hospitalization and post hospital follow up.  Of note, [facility] Care Management services does not replace or interfere with any services that are needed or arranged by inpatient [facility] care management team.   For additional questions or referrals please contact:  Natividad Brood, RN BSN [place]  [phone] business mobile phone Toll free office [phone]  *[place]  [phone] Fax number: [phone] [email or web address]

## 2022-04-13 NOTE — Progress Notes (Signed)
[place] for heparin and warfarin  Indication:  mechanical aortic valve with subtherapeutic INR  w acute stroke   No Known Allergies  Patient Measurements: Height: '5\' 9"'$  (175.3 cm) Weight: 97.5 kg (215 lb) IBW/kg (Calculated) : 70.7 Heparin Dosing Weight: 91.1kg   Vital Signs: BP: 129/71 (03/14 0825)  Labs: Recent Labs    04/11/22 0341 04/11/22 1415 04/12/22 0324 04/13/22 0236  HGB 12.6*  --  14.4  --   HCT 38.2*  --  42.5  --   PLT 232  --  246  --   LABPROT 29.4* 28.3* 27.2* 32.5*  INR 2.8* 2.7* 2.6* 3.2*  HEPARINUNFRC 0.47  --  0.36 0.33  CREATININE 1.41*  --  1.38*  --    Estimated Creatinine Clearance: 51.6 mL/min (A) (by C-G formula based on SCr of 1.38 mg/dL (H)).   Assessment: Patient presenting with CC of stroke like symptoms. MRI showing small acute infarct in addition more chronic MCA infarct. Patient on warfarin prior to admission: 3.'75mg'$  MWF, 7.'5mg'$  all other days. Pharmacy consulted for heparin bridge with warfarin restart. Last dose of warfarin on 3/9 around 22:00 per patient.   INR was subtherapeutic at 2.2 on this PTA dose on admission date 3/10.  Heparin level therapeutic, HgB 14.4, pltc wnl on 04/12/22.    INR 3.2 therapeutic, thus will discontinue IV heparin. MD asking for discharge warfarin dose today. I recommend slight increase from PTA dosage regimen to  Warfarin 3.'75mg'$  qMF and 7.5 mg all other days of week, and check INR by Tues/Wed next week.  Goal of Therapy:  INR: 2.5-3.5 (target 3 to 3.5) Anti-Xa: 0.3-0.5  Monitor platelets by anticoagulation protocol: Yes   Plan:  Discontinue IV heparin now. Warfarin  7.5 mg today  If discharged today, recommended discharge dose is 3.'75mg'$  every Monday/Friday and 7.'5mg'$  all other days of the week.  And to check INR by Tues/Wed of next week at coumadin clinic.     Thank you,  Nicole Cella, RPh Clinical Pharmacist 04/13/2022 9:33 AM  Please check AMION for all [place] phone numbers After 10:00 PM, call [place] [phone]

## 2022-04-13 NOTE — Plan of Care (Signed)
  Problem: Education: Goal: Knowledge of disease or condition will improve Outcome: Progressing Goal: Knowledge of secondary prevention will improve (MUST DOCUMENT ALL) Outcome: Progressing Goal: Knowledge of patient specific risk factors will improve (Mark N/A or DELETE if not current risk factor) Outcome: Progressing   Problem: Ischemic Stroke/TIA Tissue Perfusion: Goal: Complications of ischemic stroke/TIA will be minimized Outcome: Progressing   Problem: Coping: Goal: Will verbalize positive feelings about self Outcome: Progressing Goal: Will identify appropriate support needs Outcome: Progressing   

## 2022-04-13 NOTE — Progress Notes (Signed)
Pt refused CPAP for tonight.  

## 2022-04-14 ENCOUNTER — Telehealth: Payer: Self-pay | Admitting: *Deleted

## 2022-04-14 ENCOUNTER — Encounter: Payer: Self-pay | Admitting: *Deleted

## 2022-04-14 NOTE — Transitions of Care (Post Inpatient/ED Visit) (Signed)
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  I have had to self-catherize myself a few times, but have also been able to go on my own, so no problems there.  Taking the medicines like they told me to.  Hoping no more strokes occur again- going to Coumadin clinic on Monday") Any questions or concerns?: No  Items Reviewed: Did you receive and understand the discharge instructions provided?: Yes (thoroughly reviewed with patient who verbalizes excellent understanding of same) Medications obtained and verified?: Yes (Medications Reviewed) (Full medication review completed; no concerns or discrepancies identified; confirmed patient obtained/ is taking all newly Rx'd medications as instructed; self-manages medications and denies questions/ concerns around medications today) Any new allergies since your discharge?: No Dietary orders reviewed?: Yes Type of Diet Ordered:: diabetic; heart healthy Do you have support at home?: Yes People in Home: spouse Name of Support/Comfort Primary Source: Reports he is independent in self-care activities, including sefl I/O catherization; wife assists as/ if needed/ indicated  Home Care and Equipment/Supplies: [place] Ordered?: Yes Name of [place]:: Amydesis-- to resume prior services which were active prior to hospital admission on 04/09/22 Has Agency set up a time to come to your home?: No EMR reviewed for [place] Orders:  (confirmed spouse has spoken  with home health agency to arrange resumption of services; confirmed pt, is currently active with RN [place], scheduled to call next on 04/21/22) Any new equipment or medical supplies ordered?: No  Functional Questionnaire: Do you need assistance with bathing/showering or dressing?: No Do you need assistance with meal preparation?: No Do you need assistance with eating?: Yes (wife assists-- reports due to prior deficit from prior CVA) Do you have difficulty maintaining continence: No (reports currently using I/O catheter prn) Do you need assistance with getting out of bed/getting out of a chair/moving?: No Do you have difficulty managing or taking your medications?: No  Folllow up appointments reviewed: PCP Follow-up appointment confirmed?: Yes Date of PCP follow-up appointment?: 04/27/22 Follow-up Provider: PCP [facility] Follow-up appointment confirmed?: Yes (care coordination outreach in real-time with scheduling care guide to successfully schedule hospital follow up PCP appointment 04/27/22) Date of Specialist follow-up appointment?: 04/17/22 Follow-Up Specialty Provider:: Coumadin Clinic Do you need transportation to your follow-up appointment?: No Do you understand care options if your condition(s) worsen?: Yes-patient verbalized understanding  SDOH Interventions Today    Flowsheet Row Most Recent Value  SDOH Interventions   Food Insecurity Interventions Intervention Not Indicated  Transportation Interventions Intervention Not Indicated  [spouse provides transportation]      TOC Interventions Today    Flowsheet Row Most Recent Value  TOC Interventions   TOC Interventions Discussed/Reviewed TOC Interventions Discussed, Arranged PCP follow up less than 12 days/Care Guide scheduled      Interventions Today    Flowsheet Row Most Recent Value  Chronic Disease   Chronic disease during today's visit Diabetes, Congestive Heart Failure (CHF), Hypertension  (HTN), Other  [Acute CVA]  General Interventions   General Interventions Discussed/Reviewed Doctor Visits, Referral to Nurse, Communication with,  General Interventions Discussed  [confirmed RN CM Care Coordinator currently active in patient's care- next scheduled telephone visit on 04/21/22]  Doctor Visits Discussed/Reviewed Specialist, PCP, Doctor Visits Discussed  PCP/Specialist Visits Compliance with follow-up visit  Communication with RN  Education Interventions   Education Provided Provided Education  Provided Verbal Education On Nutrition, When to see the doctor, Medication  [signs/ symptoms low blood sugar with corresponding action plan for same,  specific dosing guidelines for Coumadin dosing post- hospital discharge]  Nutrition Interventions   Nutrition Discussed/Reviewed Nutrition Discussed  Pharmacy Interventions   Pharmacy Dicussed/Reviewed Pharmacy Topics Discussed, Medications and their functions  [Full medication review with updating medication list in EHR per patient report]      Oneta Rack, RN, BSN, CCRN Alumnus RN CM Care Coordination/ Transition of [facility] [phone]: direct office

## 2022-04-17 ENCOUNTER — Ambulatory Visit: Payer: Medicare HMO

## 2022-04-18 ENCOUNTER — Ambulatory Visit: Payer: Medicare HMO | Attending: Cardiovascular Disease | Admitting: *Deleted

## 2022-04-18 DIAGNOSIS — Z952 Presence of prosthetic heart valve: Secondary | ICD-10-CM

## 2022-04-18 DIAGNOSIS — I480 Paroxysmal atrial fibrillation: Secondary | ICD-10-CM | POA: Diagnosis not present

## 2022-04-18 LAB — POCT INR: POC INR: 5.6

## 2022-04-18 NOTE — Patient Instructions (Addendum)
Description   Hold warfarin today and tomorrow then START taking  1 tablet daily except for 1/2 tablet on Sundays and Wednesday. Continue consistency with leafy green intake 3 servings/week. Recheck INR in 1 week.  Coumadin Clinic [phone].

## 2022-04-19 ENCOUNTER — Ambulatory Visit: Payer: Medicare HMO

## 2022-04-19 ENCOUNTER — Telehealth: Payer: Self-pay | Admitting: Student

## 2022-04-19 DIAGNOSIS — R310 Gross hematuria: Secondary | ICD-10-CM | POA: Diagnosis not present

## 2022-04-19 DIAGNOSIS — E119 Type 2 diabetes mellitus without complications: Secondary | ICD-10-CM | POA: Diagnosis not present

## 2022-04-19 DIAGNOSIS — R338 Other retention of urine: Secondary | ICD-10-CM | POA: Diagnosis not present

## 2022-04-19 NOTE — Telephone Encounter (Signed)
Patient reached out regarding worsening urinary retention and hematuria in the setting of supratherapeutic INR on warfarin of 5.6.  Follows with anticoagulation clinic for which warfarin is being held.  He describes worsening urinary retention with passing of large clots since this evening.  Wanted recommendations on whether he should just wait it out or come to the ED.  We discussed my concern regarding worsening urinary retention despite self-catheterization in the setting of clots and possible need for continuous bladder irrigation.  He follows with [facility] for which she understands recommendation to come to the ED but he is opting to reach out to them for further recommendations which is reasonable.

## 2022-04-21 ENCOUNTER — Telehealth: Payer: Self-pay | Admitting: *Deleted

## 2022-04-21 ENCOUNTER — Telehealth: Payer: Self-pay | Admitting: Family

## 2022-04-21 ENCOUNTER — Telehealth: Payer: Self-pay | Admitting: Anesthesiology

## 2022-04-21 ENCOUNTER — Ambulatory Visit: Payer: Self-pay

## 2022-04-21 NOTE — Telephone Encounter (Signed)
Pt's wife called to see if recommends a blood test for him to test to see if he is anemic or if something else might be going on. Pt is very tired from all that has been happening. His INR was 5.6. He had to have his foley changed on Wednesday and he has some bleeding from that. Please call Pamala Hurry to advise.

## 2022-04-21 NOTE — Patient Outreach (Signed)
  Care Coordination   Follow Up Visit Note   04/21/2022 Name: [name] MRN: [id number] DOB: 05-20-1945  [name] is a 77 y.o. year old male who sees [name], NP for primary care. I spoke with  [name] by phone today.  What matters to the patients health and wellness today?  12/26-12/29/23 acute ischemic stroke, 02/21/22 urinary retention; 03/14/22 TEE; recent CVA 3/11/01/12 with subtherapeutic INR. Spoke with [name] and his wife. Patient in good spirits and without questions. [name] reports concern that labs may need to be drawn due to recent catheterizations(blood in foley bag/high INR) and patient is "tired". She states it look like it has improved some. Per [name] she will reach out to provider to express her concerns. [name] confirmed that home health has started services. RNCM reinforced if lab work needed, home health nurse could possibly draw lab work per provider request.  Goals Addressed             This Visit's Progress    Health Management       Interventions Today    Flowsheet Row Most Recent Value  Chronic Disease   Chronic disease during today's visit Other  [post hospitallization]  General Interventions   General Interventions Discussed/Reviewed General Interventions Reviewed, Doctor Visits  Doctor Visits Discussed/Reviewed Doctor Visits Reviewed  PCP/Specialist Visits Compliance with follow-up visit  Education Interventions   Education Provided Provided Education  Provided Verbal Education On When to see the doctor, Other, Medication  [encouraged to attend provider visits as scheduled, encouraged to contact provider with health questions/concerns and if condition worsens or does not improve. encouraged to continue to participate with home health therapy as recommended]  Pharmacy Interventions   Pharmacy Dicussed/Reviewed Pharmacy Topics Reviewed           SDOH assessments and interventions completed:  No  Care  Coordination Interventions:  Yes, provided   Follow up plan: Follow up call scheduled for 05/04/22 10:30 am.    Encounter Outcome:  Pt. Visit Completed   Thea Silversmith, RN, MSN, BSN, [place] Coordinator [phone]

## 2022-04-21 NOTE — Patient Instructions (Signed)
Visit Information  Thank you for taking time to visit with me today. Please don't hesitate to contact me if I can be of assistance to you.   Following are the goals we discussed today:   Goals Addressed             This Visit's Progress    Health Management       Interventions Today    Flowsheet Row Most Recent Value  Chronic Disease   Chronic disease during today's visit Other  [post hospitallization]  General Interventions   General Interventions Discussed/Reviewed General Interventions Reviewed, Doctor Visits  Doctor Visits Discussed/Reviewed Doctor Visits Reviewed  PCP/Specialist Visits Compliance with follow-up visit  Education Interventions   Education Provided Provided Education  Provided Verbal Education On When to see the doctor, Other, Medication  [encouraged to attend provider visits as scheduled, encouraged to contact provider with health questions/concerns and if condition worsens or does not improve. encouraged to continue to participate with home health therapy as recommended]  Pharmacy Interventions   Pharmacy Dicussed/Reviewed Pharmacy Topics Reviewed           Our next appointment is by telephone on 05/04/22 at 10:30 am  Please call the care guide team at [phone] if you need to cancel or reschedule your appointment.   If you are experiencing a Mental Health or [place] or need someone to talk to, please call the Suicide and Crisis Lifeline: [place], RN, MSN, BSN, [place] [phone]

## 2022-04-21 NOTE — Telephone Encounter (Signed)
[name] from [facility] called stating pt had missed an appointment on 04/19/22 due to an emergency visit to the Urologist. States she needs verbal orders to move appointment to next week. Call back number is [phone].

## 2022-04-21 NOTE — Telephone Encounter (Signed)
Pt's wife called and wanted to update the clinic and make [name] aware that pt now has a foley and is passing clots from the foley. Wife concerned about the bleeding from the foley. Pt's wife wondering if pt should have iron and hemoglobin drawn.  Instructed her to call the urologist concerning needing labs drawn and bleeding from the foley. Pt will be having INR drawn again on Tuesday March 26.

## 2022-04-24 ENCOUNTER — Encounter: Payer: Self-pay | Admitting: Family Medicine

## 2022-04-24 ENCOUNTER — Ambulatory Visit (INDEPENDENT_AMBULATORY_CARE_PROVIDER_SITE_OTHER): Payer: Medicare HMO | Admitting: Family Medicine

## 2022-04-24 VITALS — BP 109/92 | HR 72 | Temp 97.6°F | Resp 18 | Ht 70.0 in | Wt 213.0 lb

## 2022-04-24 DIAGNOSIS — R5383 Other fatigue: Secondary | ICD-10-CM

## 2022-04-24 DIAGNOSIS — R310 Gross hematuria: Secondary | ICD-10-CM

## 2022-04-24 DIAGNOSIS — R8290 Unspecified abnormal findings in urine: Secondary | ICD-10-CM | POA: Diagnosis not present

## 2022-04-24 LAB — POC URINALSYSI DIPSTICK (AUTOMATED)
Glucose, UA: POSITIVE — AB
Ketones, UA: 15
Nitrite, UA: POSITIVE
Protein, UA: POSITIVE — AB
Spec Grav, UA: 1.015 (ref 1.010–1.025)
Urobilinogen, UA: 0.2 E.U./dL
pH, UA: 6 (ref 5.0–8.0)

## 2022-04-24 MED ORDER — CEPHALEXIN 500 MG PO CAPS: 500.0000 mg | ORAL_CAPSULE | Freq: Two times a day (BID) | ORAL | 0 refills | Status: AC

## 2022-04-24 NOTE — Patient Instructions (Addendum)
Urine sample  Blood work today Keep upcoming urologist appointment Keep INR check appointment tomorrow  We will let you know results as everything comes back

## 2022-04-24 NOTE — Progress Notes (Signed)
Acute Office Visit  Subjective:     Patient ID: [name], male    DOB: 29-Aug-1945, 77 y.o.   MRN: [id number]  Chief Complaint  Patient presents with   anemia/ fatigue    Bleeding d/t in and out cath. Cath remains out. Last INR was really high.     HPI Patient is in today for fatigue/anemia. He is here with his wife today.   Patient has had several hospitalization this year. Reports that after CVA in December, he was given a foley catheter. He had followed up with urology out patient and they had done voiding trials. After last hospitalization/discharge in March, he ended up switching to in and out self-caths. Reports that he started noticing increasing amounts of blood in his urine. He went to urologist last week and they suggested replacing indwelling foley to reduce trauma as INR was 5.6 last week - he follows with cardiology and warfarin was adjusted (due to be checked again tomorrow). He reports that his urine is red and he has been feeling more fatigued/weak the past week compared to baseline. He is worried he is getting anemic. He denies any pain, fevers, chills, confusion. He is scheduled to see urology and PCP later this week as well.   ROS All review of systems negative except what is listed in the HPI      Objective:    BP (!) 109/92 (BP Location: Left Arm, Patient Position: Sitting, Cuff Size: Large)   Pulse 72   Temp 97.6 F (36.4 C) (Oral)   Resp 18   Ht 5\' 10"  (1.778 m)   Wt 213 lb (96.6 kg)   SpO2 99%   BMI 30.56 kg/m    Physical Exam Vitals reviewed.  Constitutional:      Appearance: Normal appearance.  HENT:     Head:     Comments: Hard of hearing Cardiovascular:     Rate and Rhythm: Normal rate and regular rhythm.     Pulses: Normal pulses.     Heart sounds: Normal heart sounds.  Pulmonary:     Effort: Pulmonary effort is normal.     Breath sounds: Normal breath sounds.  Abdominal:     Tenderness: There is no right CVA tenderness or  left CVA tenderness.  Genitourinary:    Comments: Foley in place, leg bag with red urine Skin:    General: Skin is warm and dry.  Neurological:     Mental Status: He is alert and oriented to person, place, and time.  Psychiatric:        Mood and Affect: Mood normal.        Behavior: Behavior normal.        Thought Content: Thought content normal.        Judgment: Judgment normal.      Results for orders placed or performed in visit on 04/24/22  POCT Urinalysis Dipstick (Automated)  Result Value Ref Range   Color, UA purple    Clarity, UA cloudy    Glucose, UA Positive (A) Negative   Bilirubin, UA large    Ketones, UA 15    Spec Grav, UA 1.015 1.010 - 1.025   Blood, UA large    pH, UA 6.0 5.0 - 8.0   Protein, UA Positive (A) Negative   Urobilinogen, UA 0.2 0.2 or 1.0 E.U./dL   Nitrite, UA positive    Leukocytes, UA Moderate (2+) (A) Negative        Assessment & Plan:  Problem List Items Addressed This Visit   None Visit Diagnoses     Gross hematuria    -  Primary   Relevant Orders   CBC with Differential/Platelet   Comprehensive metabolic panel   POCT Urinalysis Dipstick (Automated) (Completed)   Fatigue, unspecified type       Relevant Orders   CBC with Differential/Platelet   Comprehensive metabolic panel   Abnormal urine findings       Relevant Medications   cephALEXin (KEFLEX) 500 MG capsule   Other Relevant Orders   Urine Culture      Urine sample - abnormal; adding keflex and sending for culture Blood work today Keep upcoming urologist appointment Keep INR check appointment tomorrow  We will let you know results as everything comes back   Please contact office for follow-up if symptoms do not improve or worsen. Seek emergency care if symptoms become severe.    Meds ordered this encounter  Medications   cephALEXin (KEFLEX) 500 MG capsule    Sig: Take 1 capsule (500 mg total) by mouth 2 (two) times daily for 7 days.    Dispense:  14 capsule     Refill:  0    Order Specific Question:   Supervising Provider    Answer:   Penni Homans A [id number]    Return if symptoms worsen or fail to improve.  Terrilyn Saver, NP

## 2022-04-24 NOTE — Telephone Encounter (Signed)
Pt's wife, Pamala Hurry, called back to follow up on message but she said that she is concerned with him getting weaker. She wanted to come in today to see [name] but provider does not have any earlier appts than when he is scheduled to come in. I scheduled him with Caleen Jobs today. Wife still wanted [name] to know. Please call to advise if [name] needs them to do anything.

## 2022-04-25 ENCOUNTER — Ambulatory Visit: Payer: Medicare HMO | Attending: Cardiology | Admitting: *Deleted

## 2022-04-25 DIAGNOSIS — Z5181 Encounter for therapeutic drug level monitoring: Secondary | ICD-10-CM

## 2022-04-25 DIAGNOSIS — Z952 Presence of prosthetic heart valve: Secondary | ICD-10-CM | POA: Diagnosis not present

## 2022-04-25 LAB — COMPREHENSIVE METABOLIC PANEL
ALT: 12 U/L (ref 0–53)
AST: 16 U/L (ref 0–37)
Albumin: 3.8 g/dL (ref 3.5–5.2)
Alkaline Phosphatase: 61 U/L (ref 39–117)
BUN: 38 mg/dL — ABNORMAL HIGH (ref 6–23)
CO2: 26 mEq/L (ref 19–32)
Calcium: 8.5 mg/dL (ref 8.4–10.5)
Chloride: 98 mEq/L (ref 96–112)
Creatinine, Ser: 1.47 mg/dL (ref 0.40–1.50)
GFR: 45.86 mL/min — ABNORMAL LOW (ref >60.00)
Glucose, Bld: 261 mg/dL — ABNORMAL HIGH (ref 70–99)
Potassium: 3.5 mEq/L (ref 3.5–5.1)
Sodium: 135 mEq/L (ref 135–145)
Total Bilirubin: 0.3 mg/dL (ref 0.2–1.2)
Total Protein: 7 g/dL (ref 6.0–8.3)

## 2022-04-25 LAB — POCT INR: POC INR: 3.9

## 2022-04-25 LAB — CBC WITH DIFFERENTIAL/PLATELET
Basophils Absolute: 0.1 10*3/uL (ref 0.0–0.1)
Basophils Relative: 1 % (ref 0.0–3.0)
Eosinophils Absolute: 0.2 10*3/uL (ref 0.0–0.7)
Eosinophils Relative: 2 % (ref 0.0–5.0)
HCT: 35.4 % — ABNORMAL LOW (ref 39.0–52.0)
Hemoglobin: 12 g/dL — ABNORMAL LOW (ref 13.0–17.0)
Lymphocytes Relative: 12.1 % (ref 12.0–46.0)
Lymphs Abs: 1.3 10*3/uL (ref 0.7–4.0)
MCHC: 33.8 g/dL (ref 30.0–36.0)
MCV: 85.7 fl (ref 78.0–100.0)
Monocytes Absolute: 0.7 10*3/uL (ref 0.1–1.0)
Monocytes Relative: 6.7 % (ref 3.0–12.0)
Neutro Abs: 8.3 10*3/uL — ABNORMAL HIGH (ref 1.4–7.7)
Neutrophils Relative %: 78.2 % — ABNORMAL HIGH (ref 43.0–77.0)
Platelets: 340 10*3/uL (ref 150.0–400.0)
RBC: 4.13 Mil/uL — ABNORMAL LOW (ref 4.22–5.81)
RDW: 15.1 % (ref 11.5–15.5)
WBC: 10.7 10*3/uL — ABNORMAL HIGH (ref 4.0–10.5)

## 2022-04-25 NOTE — Patient Instructions (Signed)
Description   Take 1/2 a tablet of warfarin today And continue  taking 1  tablet daily except for 1/2 tablet on Sundays and Wednesday. Continue consistency with leafy green intake 3 servings/week. Recheck INR in 1 week.  [facility] [phone].

## 2022-04-26 DIAGNOSIS — R338 Other retention of urine: Secondary | ICD-10-CM | POA: Diagnosis not present

## 2022-04-26 DIAGNOSIS — R310 Gross hematuria: Secondary | ICD-10-CM | POA: Diagnosis not present

## 2022-04-27 ENCOUNTER — Encounter (HOSPITAL_BASED_OUTPATIENT_CLINIC_OR_DEPARTMENT_OTHER): Payer: Self-pay

## 2022-04-27 ENCOUNTER — Emergency Department (HOSPITAL_BASED_OUTPATIENT_CLINIC_OR_DEPARTMENT_OTHER)
Admission: EM | Admit: 2022-04-27 | Discharge: 2022-04-27 | Disposition: A | Discharge status: Home / Self Care | Payer: Medicare HMO | Attending: Emergency Medicine | Admitting: Emergency Medicine

## 2022-04-27 ENCOUNTER — Other Ambulatory Visit: Payer: Self-pay

## 2022-04-27 ENCOUNTER — Emergency Department (HOSPITAL_BASED_OUTPATIENT_CLINIC_OR_DEPARTMENT_OTHER)
Admission: EM | Admit: 2022-04-27 | Discharge: 2022-04-27 | Disposition: A | Discharge status: Home / Self Care | Payer: Medicare HMO | Source: Home / Self Care | Attending: Emergency Medicine | Admitting: Emergency Medicine

## 2022-04-27 ENCOUNTER — Ambulatory Visit (INDEPENDENT_AMBULATORY_CARE_PROVIDER_SITE_OTHER): Payer: Medicare HMO | Admitting: Family

## 2022-04-27 ENCOUNTER — Telehealth: Payer: Self-pay | Admitting: Anesthesiology

## 2022-04-27 ENCOUNTER — Emergency Department (HOSPITAL_BASED_OUTPATIENT_CLINIC_OR_DEPARTMENT_OTHER): Payer: Medicare HMO

## 2022-04-27 ENCOUNTER — Encounter (HOSPITAL_BASED_OUTPATIENT_CLINIC_OR_DEPARTMENT_OTHER): Payer: Self-pay | Admitting: Emergency Medicine

## 2022-04-27 ENCOUNTER — Encounter: Payer: Self-pay | Admitting: Family

## 2022-04-27 VITALS — BP 68/53 | HR 66 | Temp 98.4°F | Resp 16 | Wt 213.0 lb

## 2022-04-27 DIAGNOSIS — I959 Hypotension, unspecified: Secondary | ICD-10-CM | POA: Diagnosis not present

## 2022-04-27 DIAGNOSIS — R55 Syncope and collapse: Secondary | ICD-10-CM | POA: Insufficient documentation

## 2022-04-27 DIAGNOSIS — R339 Retention of urine, unspecified: Secondary | ICD-10-CM | POA: Insufficient documentation

## 2022-04-27 DIAGNOSIS — E876 Hypokalemia: Secondary | ICD-10-CM | POA: Insufficient documentation

## 2022-04-27 DIAGNOSIS — R319 Hematuria, unspecified: Secondary | ICD-10-CM | POA: Insufficient documentation

## 2022-04-27 DIAGNOSIS — R310 Gross hematuria: Secondary | ICD-10-CM | POA: Diagnosis not present

## 2022-04-27 DIAGNOSIS — Z7901 Long term (current) use of anticoagulants: Secondary | ICD-10-CM | POA: Insufficient documentation

## 2022-04-27 DIAGNOSIS — I10 Essential (primary) hypertension: Secondary | ICD-10-CM | POA: Diagnosis not present

## 2022-04-27 DIAGNOSIS — Z79899 Other long term (current) drug therapy: Secondary | ICD-10-CM | POA: Insufficient documentation

## 2022-04-27 DIAGNOSIS — I639 Cerebral infarction, unspecified: Secondary | ICD-10-CM

## 2022-04-27 DIAGNOSIS — N1831 Chronic kidney disease, stage 3a: Secondary | ICD-10-CM | POA: Diagnosis not present

## 2022-04-27 DIAGNOSIS — Z952 Presence of prosthetic heart valve: Secondary | ICD-10-CM

## 2022-04-27 DIAGNOSIS — D72829 Elevated white blood cell count, unspecified: Secondary | ICD-10-CM | POA: Insufficient documentation

## 2022-04-27 DIAGNOSIS — Z7982 Long term (current) use of aspirin: Secondary | ICD-10-CM | POA: Insufficient documentation

## 2022-04-27 DIAGNOSIS — E119 Type 2 diabetes mellitus without complications: Secondary | ICD-10-CM | POA: Insufficient documentation

## 2022-04-27 DIAGNOSIS — Z8673 Personal history of transient ischemic attack (TIA), and cerebral infarction without residual deficits: Secondary | ICD-10-CM | POA: Insufficient documentation

## 2022-04-27 DIAGNOSIS — I480 Paroxysmal atrial fibrillation: Secondary | ICD-10-CM | POA: Diagnosis not present

## 2022-04-27 DIAGNOSIS — Z794 Long term (current) use of insulin: Secondary | ICD-10-CM | POA: Insufficient documentation

## 2022-04-27 DIAGNOSIS — D649 Anemia, unspecified: Secondary | ICD-10-CM | POA: Insufficient documentation

## 2022-04-27 DIAGNOSIS — E1122 Type 2 diabetes mellitus with diabetic chronic kidney disease: Secondary | ICD-10-CM | POA: Diagnosis not present

## 2022-04-27 DIAGNOSIS — G4733 Obstructive sleep apnea (adult) (pediatric): Secondary | ICD-10-CM | POA: Diagnosis not present

## 2022-04-27 HISTORY — DX: Hypotension, unspecified: I95.9

## 2022-04-27 LAB — URINE CULTURE
MICRO NUMBER:: 14736561
SPECIMEN QUALITY:: ADEQUATE

## 2022-04-27 LAB — URINALYSIS, ROUTINE W REFLEX MICROSCOPIC: pH: 5.5 (ref 5.0–8.0)

## 2022-04-27 LAB — PROTIME-INR
INR: 3.3 — ABNORMAL HIGH (ref 0.8–1.2)
Prothrombin Time: 33.5 seconds — ABNORMAL HIGH (ref 11.4–15.2)

## 2022-04-27 LAB — BASIC METABOLIC PANEL
Anion gap: 9 (ref 5–15)
BUN: 36 mg/dL — ABNORMAL HIGH (ref 8–23)
CO2: 25 mmol/L (ref 22–32)
Calcium: 8.2 mg/dL — ABNORMAL LOW (ref 8.9–10.3)
Chloride: 100 mmol/L (ref 98–111)
Creatinine, Ser: 1.32 mg/dL — ABNORMAL HIGH (ref 0.61–1.24)
GFR, Estimated: 56 mL/min — ABNORMAL LOW (ref >60)
Glucose, Bld: 206 mg/dL — ABNORMAL HIGH (ref 70–99)
Potassium: 3.2 mmol/L — ABNORMAL LOW (ref 3.5–5.1)
Sodium: 134 mmol/L — ABNORMAL LOW (ref 135–145)

## 2022-04-27 LAB — COMPREHENSIVE METABOLIC PANEL
ALT: 16 U/L (ref 0–44)
AST: 26 U/L (ref 15–41)
Albumin: 3.5 g/dL (ref 3.5–5.0)
Alkaline Phosphatase: 43 U/L (ref 38–126)
Anion gap: 11 (ref 5–15)
BUN: 34 mg/dL — ABNORMAL HIGH (ref 8–23)
CO2: 25 mmol/L (ref 22–32)
Calcium: 8.4 mg/dL — ABNORMAL LOW (ref 8.9–10.3)
Chloride: 100 mmol/L (ref 98–111)
Creatinine, Ser: 1.41 mg/dL — ABNORMAL HIGH (ref 0.61–1.24)
GFR, Estimated: 51 mL/min — ABNORMAL LOW (ref >60)
Glucose, Bld: 153 mg/dL — ABNORMAL HIGH (ref 70–99)
Potassium: 3.1 mmol/L — ABNORMAL LOW (ref 3.5–5.1)
Sodium: 136 mmol/L (ref 135–145)
Total Bilirubin: 0.4 mg/dL (ref 0.3–1.2)
Total Protein: 7.4 g/dL (ref 6.5–8.1)

## 2022-04-27 LAB — CBC WITH DIFFERENTIAL/PLATELET
Abs Immature Granulocytes: 0.14 10*3/uL — ABNORMAL HIGH (ref 0.00–0.07)
Basophils Absolute: 0.1 10*3/uL (ref 0.0–0.1)
Basophils Relative: 1 %
Eosinophils Absolute: 0.1 10*3/uL (ref 0.0–0.5)
Eosinophils Relative: 1 %
HCT: 33.5 % — ABNORMAL LOW (ref 39.0–52.0)
Hemoglobin: 11 g/dL — ABNORMAL LOW (ref 13.0–17.0)
Immature Granulocytes: 1 %
Lymphocytes Relative: 7 %
Lymphs Abs: 1 10*3/uL (ref 0.7–4.0)
MCH: 28.5 pg (ref 26.0–34.0)
MCHC: 32.8 g/dL (ref 30.0–36.0)
MCV: 86.8 fL (ref 80.0–100.0)
Monocytes Absolute: 0.9 10*3/uL (ref 0.1–1.0)
Monocytes Relative: 6 %
Neutro Abs: 12.2 10*3/uL — ABNORMAL HIGH (ref 1.7–7.7)
Neutrophils Relative %: 84 %
Platelets: 310 10*3/uL (ref 150–400)
RBC: 3.86 MIL/uL — ABNORMAL LOW (ref 4.22–5.81)
RDW: 14 % (ref 11.5–15.5)
WBC: 14.4 10*3/uL — ABNORMAL HIGH (ref 4.0–10.5)
nRBC: 0 % (ref 0.0–0.2)

## 2022-04-27 LAB — URINALYSIS, MICROSCOPIC (REFLEX)
RBC / HPF: >50 RBC/hpf (ref 0–5)
RBC / HPF: >50 RBC/hpf (ref 0–5)
Squamous Epithelial / HPF: NONE SEEN /HPF (ref 0–5)

## 2022-04-27 LAB — LACTIC ACID, PLASMA
Lactic Acid, Venous: 1.9 mmol/L (ref 0.5–1.9)
Lactic Acid, Venous: 1.9 mmol/L (ref 0.5–1.9)

## 2022-04-27 MED ORDER — ONDANSETRON HCL 4 MG/2ML IJ SOLN: 4.0000 mg | Freq: Once | INTRAMUSCULAR | Status: DC

## 2022-04-27 MED ORDER — POTASSIUM CHLORIDE CRYS ER 20 MEQ PO TBCR: 20.0000 meq | EXTENDED_RELEASE_TABLET | Freq: Two times a day (BID) | ORAL | 0 refills | Status: DC

## 2022-04-27 MED ORDER — INSULIN ASPART PROT & ASPART (70-30 MIX) 100 UNIT/ML ~~LOC~~ SUSP
14.0000 [IU] | Freq: Once | SUBCUTANEOUS | Status: AC
  Administered 2022-04-27: 14 [IU] via SUBCUTANEOUS
  Filled 2022-04-27: qty 14

## 2022-04-27 MED ORDER — SODIUM CHLORIDE 0.9 % IV SOLN: INTRAVENOUS | Status: DC

## 2022-04-27 MED ORDER — SODIUM CHLORIDE 0.9 % IV BOLUS
500.0000 mL | Freq: Once | INTRAVENOUS | Status: AC
  Administered 2022-04-27: 500 mL via INTRAVENOUS

## 2022-04-27 MED ORDER — POTASSIUM CHLORIDE CRYS ER 20 MEQ PO TBCR
40.0000 meq | EXTENDED_RELEASE_TABLET | Freq: Once | ORAL | Status: AC
  Administered 2022-04-27: 40 meq via ORAL
  Filled 2022-04-27: qty 2

## 2022-04-27 NOTE — Assessment & Plan Note (Signed)
He has returned to his mental status baseline.  Continue statin and coumadin for secondary prevention.

## 2022-04-27 NOTE — Progress Notes (Signed)
Subjective:     Patient ID: [name], male    DOB: 1945-05-02, 77 y.o.   MRN: [id number]  Chief Complaint  Patient presents with   Hospitalization Follow-up    Her for follow up after hospitalization due to stroke    HPI Patient is in today for hospital follow up.  He is accompanied by his wife. He presented to the ED on 04/09/22 with expressive aphasia and was found to have a an acute CVA.  MRI at that visit noted the following:   IMPRESSION: 1. Positive for a tiny acute cortical infarct of the left frontal operculum, within an area of chronic Left MCA middle division infarct with encephalomalacia and laminar necrosis   INR was subtherapeutic at that time at 2.2. Goal INR is 3.0-3.5 given his mechanical heart valve and previous CVA. His speech improved during his hospitalization.  INR on 3/26 was 3.9. This is being managed by the coumadin clinic.   DM2-  Lab Results  Component Value Date   HGBA1C 7.3 (H) 04/10/2022   HGBA1C 8.2 (H) 01/24/2022   HGBA1C 7.7 (A) 10/28/2021   Lab Results  Component Value Date   MICROALBUR <0.7 10/04/2021   LDLCALC 56 04/10/2022   CREATININE 1.32 (H) 04/27/2022   Urinary retention- patient had been self cathing at home but last night he and his wife were unable to drain his bladder with catheter.  He presented to the ED where a foley was placed and wife reports 10105ml of urine were drained.   HTN- BP Readings from Last 3 Encounters:  04/27/22 (!) 68/53  04/27/22 (!) 146/63  04/24/22 (!) 109/92   Urinary retention/UTI- he was seen in the ED for urinary retention at 3 AM this morning.  He was unable to urinate for several hours and was unable to self cath. A foley was placed and drained to leg bag.  Urine culture that was done on 3/25 noted Serratia marcescens. He is on Keflex for UTI.    Health Maintenance Due  Topic Date Due   Zoster Vaccines- Shingrix (1 of 2) Never done   Medicare Annual Wellness (AWV)  01/18/2017   COVID-19  Vaccine (5 - 2023-24 season) 09/30/2021   DTaP/Tdap/Td (3 - Td or Tdap) 03/01/2022    Past Medical History:  Diagnosis Date   Actinic keratosis 02/14/2011   Allergy    Aneurysm of ascending aorta without rupture ([place]) 01/06/2022   Aortic stenosis    Arthritis    Bilateral sensorineural hearing loss 06/16/2009   CAD (coronary artery disease)    cabg   Complication of anesthesia    Coronary atherosclerosis of native coronary artery 06/15/2009   Diabetic polyneuropathy 11/11/2018   Dyslipidemia [id number]   Embolic stroke involving left middle cerebral artery  05/30/2021   Erectile dysfunction 05/18/2009   Essential hypertension 06/16/2009   Expressive aphasia 06/25/2021   External hemorrhoids 03/21/2010   Gout 04/09/2015   Heart murmur    Hemochromatosis 02/14/2011   History of aortic valve replacement 10/13/2009   History of CABG 11/05/2018   2005- 1. Coronary artery bypass grafting x4 with LIMA-LAD, SVG-OM1, SVG-AM and dCFX.  2. Aortic valve replacement with [facility] AVR 3. Septal myomectomy.  4. Myoview low risk 2016   History of hepatitis 01/31/1972   Hypertriglyceridemia 06/13/2010   Hypertrophic obstructive cardiomyopathy 05/06/2008   SP septal myomectomy at surgery 2005 Echo Nov 2018- Hyperdynamic LVEF with severe basal septal hypertrophy. There is chordal SAM with resting gradient of  16 mmHg that increases to 85 mmHg with Valsalva        Leg weakness, bilateral 01/18/2021   Long term (current) use of anticoagulants 05/22/2018   Lumbar stenosis with neurogenic claudication 02/04/2021   Malignant neoplasm of prostate 06/17/2009   Mechanical heart valve present    Manufacturer: Sherlean Foot #: [id number]  Model #: [id number]. Card states MRI compatible with 3 teslas or less.   Obesity, unspecified 04/29/2008   OSA (obstructive sleep apnea) 07/12/2012   moderate-severe; uses CPAP nightly   PONV (postoperative nausea and vomiting)    after CABG- slow to wake up    Presence of prosthetic heart valve    Primary osteoarthritis of left shoulder 10/31/2018   S/P lumbar laminectomy 02/04/2021   Stage 3 chronic kidney disease due to type 2 diabetes mellitus 11/11/2018   Tobacco use 06/16/2009   Type 2 diabetes mellitus with hyperglycemia    Ventral hernia 03/03/2014    Past Surgical History:  Procedure Laterality Date   AORTIC VALVE REPLACEMENT  11/14/2003   [facility]   APPENDECTOMY  [place]   CORONARY ANGIOGRAPHY  10/11/2021   CORONARY ARTERY BYPASS GRAFT  10/2003   [facility]   right, inguinal   HERNIA REPAIR  2002   left, inguinal   HIP SURGERY  2006   right hip   IR CT HEAD LTD  02/03/2022   IR PERCUTANEOUS ART THROMBECTOMY/INFUSION INTRACRANIAL INC DIAG ANGIO  01/24/2022   LUMBAR LAMINECTOMY/DECOMPRESSION MICRODISCECTOMY Bilateral 02/04/2021   Procedure: Bilateral Lumbar Two-Three Laminectomy;  Surgeon: Kristeen Miss, MD;  Location: [facility];  Service: Neurosurgery;  Laterality: Bilateral;  3C/RM 20   PILONIDAL CYST EXCISION  1964   prostate seed implant  03/2010   RADIOLOGY WITH ANESTHESIA N/A 01/24/2022   Procedure: IR WITH ANESTHESIA;  Surgeon: Radiologist, Medication, MD;  Location: [facility];  Service: Radiology;  Laterality: N/A;   TEE WITHOUT CARDIOVERSION N/A 03/14/2022   Procedure: TRANSESOPHAGEAL ECHOCARDIOGRAM (TEE);  Surgeon: [name], MD;  Location: [facility];  Service: Cardiology;  Laterality: N/A;   TONSILLECTOMY  childhood    Family History  Problem Relation Age of Onset   Heart disease Mother    Stroke Mother    Heart disease Father    Asperger's syndrome Son    Hyperlipidemia Son    Coronary artery disease Brother    Cancer Neg Hx        negative for colon cancer    Social History   Socioeconomic History   Marital status: Married    Spouse name: Not on file   Number of children: Not on file   Years of education: 30   Highest education level: Master's degree (e.g., MA,  MS, MEng, MEd, MSW, MBA)  Occupational History   Occupation: Retired  Tobacco Use   Smoking status: Some Days    Types: Cigars    Last attempt to quit: 06/2021    Years since quitting: 0.8   Smokeless tobacco: Never   Tobacco comments:    Pt states he smokes 1 cigar about once a month. 06/01/2021    Pt quit  Cigars 06/2021  Substance and Sexual Activity   Alcohol use: Not Currently    Comment: wine maybe 1 per month   Drug use: No   Sexual activity: Never  Other Topics Concern   Not on file  Social History Narrative   ** Merged History Encounter ** Right Handed  Lives in a one sotyr home. One step leads to front     Lives with wife   Retired   Occasionally caffeine   Social Determinants of Health   Financial Resource Strain: Low Risk  (04/20/2022)   Overall Financial Resource Strain (CARDIA)    Difficulty of Paying Living Expenses: Not hard at all  Food Insecurity: No Food Insecurity (04/20/2022)   Hunger Vital Sign    Worried About Running Out of Food in the Last Year: Never true    Ran Out of Food in the Last Year: Never true  Transportation Needs: Unmet Transportation Needs (04/20/2022)   PRAPARE - Hydrologist (Medical): Yes    Lack of Transportation (Non-Medical): Yes  Physical Activity: Unknown (04/20/2022)   Exercise Vital Sign    Days of Exercise per Week: 0 days    Minutes of Exercise per Session: Not on file  Stress: No Stress Concern Present (04/20/2022)   [place]    Feeling of Stress : Not at all  Social Connections: Moderately Integrated (04/20/2022)   Social Connection and Isolation Panel [NHANES]    Frequency of Communication with Friends and Family: Once a week    Frequency of Social Gatherings with Friends and Family: Once a week    Attends Religious Services: More than 4 times per year    Active Member of [employer] or Organizations: Yes    Attends Theatre manager Meetings: 1 to 4 times per year    Marital Status: Married  Human resources officer Violence: Not on file    Outpatient Medications Prior to Visit  Medication Sig Dispense Refill   AMBULATORY NON FORMULARY MEDICATION cpap cushions            AirFit F20 (Size: Large)           AirFit F30 (Size: Med)   Please FAX the prescription to:  [phone] each prn   amLODipine (NORVASC) 5 MG tablet TAKE 1 TABLET (5 MG TOTAL) BY MOUTH DAILY. (Patient taking differently: Take 5 mg by mouth in the morning.) 90 tablet 1   aspirin EC 81 MG tablet Take 1 tablet (81 mg total) by mouth daily. Swallow whole. 30 tablet 12   betamethasone dipropionate (DIPROLENE) 0.05 % cream Apply topically 2 (two) times daily as needed. To eczema rash (Patient taking differently: Apply 1 application  topically 2 (two) times daily as needed (to eczema/rash).) 30 g 1   Blood Glucose Monitoring Suppl Supplies MISC Use for monitoring glucose level 100 each 1   cephALEXin (KEFLEX) 500 MG capsule Take 1 capsule (500 mg total) by mouth 2 (two) times daily for 7 days. 14 capsule 0   colchicine 0.6 MG tablet TAKE 1 TABLET (0.6 MG TOTAL) BY MOUTH 2 (TWO) TIMES DAILY AS NEEDED. 180 tablet 0   Continuous Blood Gluc Sensor (DEXCOM G6 SENSOR) MISC 1 Device by Does not apply route as directed. 9 each 3   dapagliflozin propanediol (FARXIGA) 10 MG TABS tablet Take 1 tablet (10 mg total) by mouth daily before breakfast. 90 tablet 3   erythromycin ophthalmic ointment 1 Application See admin instructions. Apply to affected eye(s) as directed for styes     fenofibrate (TRICOR) 145 MG tablet TAKE 1 TABLET BY MOUTH EVERY DAY (Patient taking differently: Take 145 mg by mouth in the morning.) 90 tablet 1   furosemide (LASIX) 20 MG tablet TAKE 1 TABLET BY MOUTH EVERY DAY AS  NEEDED FOR SWELLING (Patient taking differently: Take 20 mg by mouth in the morning.) 90 tablet 1   glucose blood (ONETOUCH VERIO) test strip Use as instructed 100 strip  12   insulin aspart (NOVOLOG FLEXPEN) 100 UNIT/ML FlexPen Max daily 75 units (Patient taking differently: Inject 14-19 Units into the skin See admin instructions. Inject 16 units into the skin with breakfast, 14 units with lunch, and 16 units with supper/evening meal- may increase to up to 19 units per dose as needed for high blood sugar) 75 mL 3   Insulin Glargine (BASAGLAR KWIKPEN) 100 UNIT/ML Inject 22 Units into the skin daily. (Patient taking differently: Inject 22 Units into the skin at bedtime.) 30 mL 3   Insulin Pen Needle 32G X 4 MM MISC 1 Device by Does not apply route in the morning, at noon, in the evening, and at bedtime. 400 each 3   isosorbide mononitrate (IMDUR) 30 MG 24 hr tablet Take 30 mg by mouth daily.     ketoconazole (NIZORAL) 2 % cream Apply 1 Application topically daily as needed for irritation.     metoprolol succinate (TOPROL-XL) 25 MG 24 hr tablet TAKE 1 TABLET BY MOUTH EVERY DAY (Patient taking differently: Take 25 mg by mouth in the morning.) 90 tablet 1   OneTouch Delica Lancets 99991111 MISC USE AS DIRECTED 100 each 1   pantoprazole (PROTONIX) 40 MG tablet Take 1 tablet (40 mg total) by mouth daily. 30 tablet 1   PRESCRIPTION MEDICATION CPAP- At bedtime     rosuvastatin (CRESTOR) 40 MG tablet TAKE 1 TABLET BY MOUTH EVERY DAY (Patient taking differently: Take 40 mg by mouth at bedtime.) 90 tablet 3   tacrolimus (PROTOPIC) 0.1 % ointment Apply 1 application  topically 2 (two) times daily as needed (for eczema).     tamsulosin (FLOMAX) 0.4 MG CAPS capsule Take 1 capsule (0.4 mg total) by mouth daily. 30 capsule 0   warfarin (COUMADIN) 7.5 MG tablet 3.75 mg on Monday/Friday and 7.5 mg the rest of the days. 105 tablet 1   No facility-administered medications prior to visit.    No Known Allergies  ROS    See HPI Objective:    Physical Exam Constitutional:      General: He is not in acute distress.    Appearance: He is well-developed.     Comments: Pale, diaphoretic,  dry heaving  HENT:     Head: Normocephalic and atraumatic.  Cardiovascular:     Rate and Rhythm: Normal rate and regular rhythm.     Heart sounds: S1 normal and S2 normal. No murmur heard.    Comments: Mechanical click of valve noted Pulmonary:     Effort: Pulmonary effort is normal. No respiratory distress.     Breath sounds: Normal breath sounds. No wheezing or rales.  Musculoskeletal:     Right lower leg: 3+ Edema present.     Left lower leg: 3+ Edema present.  Skin:    General: Skin is warm and dry.  Neurological:     Mental Status: He is alert and oriented to person, place, and time.  Psychiatric:        Behavior: Behavior normal.        Thought Content: Thought content normal.     BP (!) 68/53   Pulse 66   Temp 98.4 F (36.9 C) (Oral)   Resp 16   Wt 213 lb (96.6 kg)   SpO2 100%   BMI 30.56 kg/m  Wt Readings from  Last 3 Encounters:  04/27/22 213 lb (96.6 kg)  04/27/22 213 lb 13.5 oz (97 kg)  04/24/22 213 lb (96.6 kg)       Assessment & Plan:   Problem List Items Addressed This Visit       Unprioritized   History of aortic valve replacement (Chronic)    Continues coumadin per coumadin clinic with INR goal 3.0-3.5.  Most recent INR was 3/9 on 3/26.        Urinary retention    He now has foley in place.  Wife plans to schedule a follow up with his Urologist- Dr. Diona Fanti.       Type 2 diabetes mellitus with stage 3a chronic kidney disease, with long-term current use of insulin (HCC)    Glucose was 180 today in the office.        Paroxysmal atrial fibrillation (HCC)    Rate stable.  Maintained on beta blocker and coumadin.       Hypotension - Primary    Patient's blood pressure noted to be low upon arrival.  Pt reported feeling well.  However, during visit he became diaphoretic with dry heaves and weakness.  He was given some juice today as wife said he had only had a frozen waffle early this AM. Report was given to physician on duty at the [facility]  ED in [facility].  He was wheeled down by [place].        CVA (cerebral vascular accident) [facility])    He has returned to his mental status baseline.  Continue statin and coumadin for secondary prevention.       42 minutes spent on today's visit. Time was spent reviewing hospital records, interviewing patient and coordinating care with ER team.   I am having Benedetto Coons. Tisby "Len" maintain his Blood Glucose Monitoring Suppl, betamethasone dipropionate, AMBULATORY NON FORMULARY MEDICATION, Dexcom G6 Sensor, OneTouch Verio, OneTouch Delica Lancets [id number], tacrolimus, furosemide, metoprolol succinate, fenofibrate, colchicine, rosuvastatin, amLODipine, dapagliflozin propanediol, Basaglar KwikPen, NovoLOG FlexPen, Insulin Pen Needle, isosorbide mononitrate, tamsulosin, PRESCRIPTION MEDICATION, erythromycin, ketoconazole, warfarin, aspirin EC, pantoprazole, and cephALEXin.  No orders of the defined types were placed in this encounter.

## 2022-04-27 NOTE — ED Provider Notes (Signed)
[name] Provider Note   CSN: [id number] Arrival date & time: 04/27/22  0301     History  Chief Complaint  Patient presents with   Urinary Retention    [name] is a 77 y.o. male.  The history is provided by the patient.  Illness Location:  Bladder Quality:  Inability to urinate for several hours unable to self cath Severity:  Severe Onset quality:  Gradual Timing:  Constant Progression:  Unchanged Chronicity:  Recurrent Context:  Foley removed yeserday on an antibiotic since 3/25 as well as flomax Relieved by:  Nothing Worsened by:  Time Ineffective treatments:  Self cath Associated symptoms: no fever and no vomiting   Risk factors:  Urinary retention and previous CVAs      Home Medications Prior to Admission medications   Medication Sig Start Date End Date Taking? Authorizing Provider  AMBULATORY NON FORMULARY MEDICATION cpap cushions            AirFit F20 (Size: Large)           AirFit F30 (Size: Med)   Please FAX the prescription to:  [phone] 02/10/20   [name], NP  amLODipine (NORVASC) 5 MG tablet TAKE 1 TABLET (5 MG TOTAL) BY MOUTH DAILY. Patient taking differently: Take 5 mg by mouth in the morning. 12/02/21 05/31/22  Lelon Perla, MD  aspirin EC 81 MG tablet Take 1 tablet (81 mg total) by mouth daily. Swallow whole. 04/14/22   Ghimire, Henreitta Leber, MD  betamethasone dipropionate (DIPROLENE) 0.05 % cream Apply topically 2 (two) times daily as needed. To eczema rash Patient taking differently: Apply 1 application  topically 2 (two) times daily as needed (to eczema/rash). 09/06/18   [name], NP  Blood Glucose Monitoring Suppl Supplies MISC Use for monitoring glucose level 08/05/18   [name], NP  cephALEXin (KEFLEX) 500 MG capsule Take 1 capsule (500 mg total) by mouth 2 (two) times daily for 7 days. 04/24/22 05/01/22  Terrilyn Saver, NP  colchicine 0.6 MG tablet TAKE 1  TABLET (0.6 MG TOTAL) BY MOUTH 2 (TWO) TIMES DAILY AS NEEDED. 11/10/21   [name], NP  Continuous Blood Gluc Sensor (DEXCOM G6 SENSOR) MISC 1 Device by Does not apply route as directed. 05/18/20   Shamleffer, Melanie Crazier, MD  dapagliflozin propanediol (FARXIGA) 10 MG TABS tablet Take 1 tablet (10 mg total) by mouth daily before breakfast. 02/13/22   Shamleffer, Melanie Crazier, MD  erythromycin ophthalmic ointment 1 Application See admin instructions. Apply to affected eye(s) as directed for styes    [provider]  fenofibrate (TRICOR) 145 MG tablet TAKE 1 TABLET BY MOUTH EVERY DAY Patient taking differently: Take 145 mg by mouth in the morning. 11/08/21   [name], NP  furosemide (LASIX) 20 MG tablet TAKE 1 TABLET BY MOUTH EVERY DAY AS NEEDED FOR SWELLING Patient taking differently: Take 20 mg by mouth in the morning. 11/08/21   [name], NP  glucose blood (ONETOUCH VERIO) test strip Use as instructed 09/15/20   [name], NP  insulin aspart (NOVOLOG FLEXPEN) 100 UNIT/ML FlexPen Max daily 75 units Patient taking differently: Inject 14-19 Units into the skin See admin instructions. Inject 16 units into the skin with breakfast, 14 units with lunch, and 16 units with supper/evening meal- may increase to up to 19 units per dose as needed for high blood sugar 02/13/22   Shamleffer, Melanie Crazier, MD  Insulin Glargine (BASAGLAR KWIKPEN) 100 UNIT/ML  Inject 22 Units into the skin daily. Patient taking differently: Inject 22 Units into the skin at bedtime. 02/13/22   Shamleffer, Melanie Crazier, MD  Insulin Pen Needle 32G X 4 MM MISC 1 Device by Does not apply route in the morning, at noon, in the evening, and at bedtime. 02/13/22   Shamleffer, Melanie Crazier, MD  isosorbide mononitrate (IMDUR) 30 MG 24 hr tablet Take 30 mg by mouth daily. 12/02/21   [provider]  ketoconazole (NIZORAL) 2 % cream Apply 1 Application topically daily as needed  for irritation.    [provider]  metoprolol succinate (TOPROL-XL) 25 MG 24 hr tablet TAKE 1 TABLET BY MOUTH EVERY DAY Patient taking differently: Take 25 mg by mouth in the morning. 11/08/21   [name], NP  OneTouch Delica Lancets [id number] MISC USE AS DIRECTED 09/15/20   [name], NP  pantoprazole (PROTONIX) 40 MG tablet Take 1 tablet (40 mg total) by mouth daily. 04/13/22 04/13/23  Ghimire, Henreitta Leber, MD  PRESCRIPTION MEDICATION CPAP- At bedtime    [provider]  rosuvastatin (CRESTOR) 40 MG tablet TAKE 1 TABLET BY MOUTH EVERY DAY Patient taking differently: Take 40 mg by mouth at bedtime. 11/30/21   Lelon Perla, MD  tacrolimus (PROTOPIC) 0.1 % ointment Apply 1 application  topically 2 (two) times daily as needed (for eczema). 11/29/20   [provider]  tamsulosin (FLOMAX) 0.4 MG CAPS capsule Take 1 capsule (0.4 mg total) by mouth daily. 01/28/22   Otelia Santee, NP  warfarin (COUMADIN) 7.5 MG tablet 3.75 mg on Monday/Friday and 7.5 mg the rest of the days. 04/13/22   Ghimire, Henreitta Leber, MD      Allergies    Patient has no known allergies.    Review of Systems   Review of Systems  Constitutional:  Negative for fever.  HENT:  Negative for facial swelling.   Eyes:  Negative for redness.  Gastrointestinal:  Positive for abdominal distention. Negative for vomiting.  Genitourinary:  Positive for difficulty urinating.  All other systems reviewed and are negative.   Physical Exam Updated Vital Signs BP (!) 144/65   Pulse 61   Temp 97.7 F (36.5 C) (Oral)   Resp 18   Ht 5\' 10"  (1.778 m)   Wt 97 kg   SpO2 99%   BMI 30.68 kg/m  Physical Exam Vitals and nursing note reviewed.  Constitutional:      General: He is not in acute distress.    Appearance: He is well-developed. He is not diaphoretic.  HENT:     Head: Normocephalic and atraumatic.     Nose: Nose normal.  Eyes:     Conjunctiva/sclera: Conjunctivae normal.     Pupils:  Pupils are equal, round, and reactive to light.  Cardiovascular:     Rate and Rhythm: Normal rate and regular rhythm.  Pulmonary:     Effort: Pulmonary effort is normal.     Breath sounds: Normal breath sounds. No wheezing or rales.  Abdominal:     General: Bowel sounds are normal. There is distension.     Palpations: Abdomen is soft.     Tenderness: There is no abdominal tenderness. There is no guarding or rebound.  Musculoskeletal:        General: Normal range of motion.     Cervical back: Normal range of motion and neck supple.  Skin:    General: Skin is warm and dry.     Capillary Refill: Capillary refill takes  less than 2 seconds.  Neurological:     General: No focal deficit present.     Mental Status: He is alert and oriented to person, place, and time.  Psychiatric:        Mood and Affect: Mood normal.        Behavior: Behavior normal.     ED Results / Procedures / Treatments   Labs (all labs ordered are listed, but only abnormal results are displayed) Results for orders placed or performed during the hospital encounter of 04/27/22  Urinalysis, Routine w reflex microscopic -Urine, Clean Catch  Result Value Ref Range   Color, Urine RED (A) YELLOW   APPearance CLOUDY (A) CLEAR   Specific Gravity, Urine  1.005 - 1.030    TEST NOT REPORTED DUE TO COLOR INTERFERENCE OF URINE PIGMENT   pH 5.5 5.0 - 8.0   Glucose, UA (A) NEGATIVE mg/dL    TEST NOT REPORTED DUE TO COLOR INTERFERENCE OF URINE PIGMENT   Hgb urine dipstick (A) NEGATIVE    TEST NOT REPORTED DUE TO COLOR INTERFERENCE OF URINE PIGMENT   Bilirubin Urine (A) NEGATIVE    TEST NOT REPORTED DUE TO COLOR INTERFERENCE OF URINE PIGMENT   Ketones, ur (A) NEGATIVE mg/dL    TEST NOT REPORTED DUE TO COLOR INTERFERENCE OF URINE PIGMENT   Protein, ur (A) NEGATIVE mg/dL    TEST NOT REPORTED DUE TO COLOR INTERFERENCE OF URINE PIGMENT   Nitrite (A) NEGATIVE    TEST NOT REPORTED DUE TO COLOR INTERFERENCE OF URINE PIGMENT    Leukocytes,Ua (A) NEGATIVE    TEST NOT REPORTED DUE TO COLOR INTERFERENCE OF URINE PIGMENT  Urinalysis, Microscopic (reflex)  Result Value Ref Range   RBC / HPF >50 0 - 5 RBC/hpf   WBC, UA 21-50 0 - 5 WBC/hpf   Bacteria, UA RARE (A) NONE SEEN   Squamous Epithelial / HPF NONE SEEN 0 - 5 /HPF   *Note: Due to a large number of results and/or encounters for the requested time period, some results have not been displayed. A complete set of results can be found in Results Review.   VAS [name] TRANSCRANIAL DOPPLER  Result Date: 04/11/2022  Transcranial Doppler Patient Name:  [name]  Date of Exam:   04/10/2022 Medical Rec #: [id number]         Accession #:    [id number] Date of Birth: 15-May-1945         Patient Gender: M Patient Age:   58 years Exam Location:  [facility] Procedure:      VAS [name] TRANSCRANIAL DOPPLER Referring Phys: Elwin Sleight DE LA TORRE --------------------------------------------------------------------------------  Indications: Stroke. History: Aortic valve replacement, on Coumadin. CAD, HTN, HLD, DM. Limitations: Body habitus. Comparison Study: No prior study Performing Technologist: Sharion Dove RVS  Examination Guidelines: A complete evaluation includes B-mode imaging, spectral Doppler, color Doppler, and power Doppler as needed of all accessible portions of each vessel. Bilateral testing is considered an integral part of a complete examination. Limited examinations for reoccurring indications may be performed as noted.  +----------+---------------+----------+-----------+------------------+ RIGHT TCD Right VM (cm/s)Depth (cm)Pulsatility     Comment       +----------+---------------+----------+-----------+------------------+ MCA            39.00                  1.74                       +----------+---------------+----------+-----------+------------------+ ACA           -  25.00                  1.16                        +----------+---------------+----------+-----------+------------------+ Term ICA       59.00                   94                        +----------+---------------+----------+-----------+------------------+ PCA P1         51.00                  1.31                       +----------+---------------+----------+-----------+------------------+ Opthalmic      17.00                  1.71                       +----------+---------------+----------+-----------+------------------+ ICA siphon                                    unable to insonate +----------+---------------+----------+-----------+------------------+ Vertebral     -18.00                  0.93                       +----------+---------------+----------+-----------+------------------+  +----------+--------------+----------+-----------+------------------+ LEFT TCD  Left VM (cm/s)Depth (cm)Pulsatility     Comment       +----------+--------------+----------+-----------+------------------+ MCA           24.00                  0.99                       +----------+--------------+----------+-----------+------------------+ ACA                                          unable to insonate +----------+--------------+----------+-----------+------------------+ Term ICA      55.00                  1.31                       +----------+--------------+----------+-----------+------------------+ PCA P1        42.00                  1.33                       +----------+--------------+----------+-----------+------------------+ Opthalmic     21.00                   1.3                       +----------+--------------+----------+-----------+------------------+ ICA siphon    16.00                  1.50                       +----------+--------------+----------+-----------+------------------+ Vertebral  unable to insonate  +----------+--------------+----------+-----------+------------------+  +------------+-------+------------------+             VM cm/s     Comment       +------------+-------+------------------+ Prox Basilar       unable to insonate +------------+-------+------------------+ Dist Basilar       unable to insonate +------------+-------+------------------+ Summary:  Low normal mean flow velocities in majority of identified vessels of anterior circulation. Poor suboccipital window limits evaluation of posterior circulation vessels.Elevated pulsatility indices suggest diffuse intracranial atherosclerosis. *See table(s) above for TCD measurements and observations.  Diagnosing physician: Antony Contras MD Electronically signed by Antony Contras MD on 04/11/2022 at 10:20:53 AM.    Final    VAS US CAROTID (at [facility] and [place] only)  Result Date: 04/11/2022 Carotid Arterial Duplex Study Patient Name:  [name]  Date of Exam:   04/10/2022 Medical Rec #: [id number]         Accession #:    [id number] Date of Birth: 1945/03/10         Patient Gender: M Patient Age:   42 years Exam Location:  [facility] Procedure:      VAS US CAROTID Referring Phys: Adella Hare --------------------------------------------------------------------------------  Indications:       CVA and Speech disturbance. Risk Factors:      Hypertension, hyperlipidemia, Diabetes. Other Factors:     Aortic valve replacement on Coumadin. Limitations        Today's exam was limited due to patient sighing throughout                    exam. Comparison Study:  Prior normal carotid duplex done 12/27/20 Performing Technologist: Sharion Dove RVS  Examination Guidelines: A complete evaluation includes B-mode imaging, spectral Doppler, color Doppler, and power Doppler as needed of all accessible portions of each vessel. Bilateral testing is considered an integral part of a complete examination. Limited examinations for reoccurring indications may be  performed as noted.  Right Carotid Findings: +----------+--------+--------+--------+------------------+------------------+           PSV cm/sEDV cm/sStenosisPlaque DescriptionComments           +----------+--------+--------+--------+------------------+------------------+ CCA Prox  72      11                                intimal thickening +----------+--------+--------+--------+------------------+------------------+ CCA Distal55      14              calcific                             +----------+--------+--------+--------+------------------+------------------+ ICA Prox  57      18      1-39%   calcific                             +----------+--------+--------+--------+------------------+------------------+ ICA Mid   71      20                                                   +----------+--------+--------+--------+------------------+------------------+ ICA Distal37      8                                                    +----------+--------+--------+--------+------------------+------------------+  ECA       78      1                                                    +----------+--------+--------+--------+------------------+------------------+ +----------+--------+-------+--------+-------------------+           PSV cm/sEDV cmsDescribeArm Pressure (mmHG) +----------+--------+-------+--------+-------------------+ [id number]                                         +----------+--------+-------+--------+-------------------+ +---------+--------+--+--------+--+ VertebralPSV cm/s32EDV cm/s12 +---------+--------+--+--------+--+  Left Carotid Findings: +----------+--------+--------+--------+------------------+--------+           PSV cm/sEDV cm/sStenosisPlaque DescriptionComments +----------+--------+--------+--------+------------------+--------+ CCA Prox  68      7               calcific                    +----------+--------+--------+--------+------------------+--------+ CCA Distal68      13              calcific                   +----------+--------+--------+--------+------------------+--------+ ICA Prox  71      18      1-39%   calcific          tortuous +----------+--------+--------+--------+------------------+--------+ ICA Mid   37      9                                          +----------+--------+--------+--------+------------------+--------+ ICA Distal105     20                                tortuous +----------+--------+--------+--------+------------------+--------+ ECA       45      8                                          +----------+--------+--------+--------+------------------+--------+ +----------+--------+--------+--------+-------------------+           PSV cm/sEDV cm/sDescribeArm Pressure (mmHG) +----------+--------+--------+--------+-------------------+ [id number]                                          +----------+--------+--------+--------+-------------------+ +---------+--------+--+--------+-+ VertebralPSV cm/s26EDV cm/s6 +---------+--------+--+--------+-+   Summary: Right Carotid: Velocities in the right ICA are consistent with a 1-39% stenosis. Left Carotid: Velocities in the left ICA are consistent with a 1-39% stenosis. Vertebrals:  Bilateral vertebral arteries demonstrate antegrade flow. Subclavians: Normal flow hemodynamics were seen in bilateral subclavian              arteries. *See table(s) above for measurements and observations.  Electronically signed by Antony Contras MD on 04/11/2022 at 10:18:41 AM.    Final    ECHOCARDIOGRAM LIMITED  Result Date: 04/10/2022    ECHOCARDIOGRAM LIMITED REPORT   Patient Name:   [name] Date of Exam: 04/10/2022 Medical Rec #:  [id number]  Height:       69.0 in Accession #:    [id number]       Weight:       215.0 lb Date of Birth:  1945/09/27        BSA:          2.130 m Patient  Age:    48 years         BP:           139/93 mmHg Patient Gender: M                HR:           61 bpm. Exam Location:  Inpatient Procedure: Limited Echo, Cardiac Doppler and Color Doppler Indications:    Stroke  History:        Patient has prior history of Echocardiogram examinations, most                 recent 03/14/2022. CAD, Prior Cardiac Surgery, Stroke, Aortic                 Valve Disease; Risk Factors:Diabetes, Former Smoker, Sleep Apnea                 and Hypertension.                 Aortic Valve: 23 mm St. Jude mechanical valve is present in the                 aortic position. Procedure Date: 11/12/03.  Sonographer:    Eartha Inch Referring Phys: Ennius.Cumber KENNETH C HILTY  Sonographer Comments: Technically difficult study due to poor echo windows. Image acquisition challenging due to patient body habitus and Image acquisition challenging due to respiratory motion. IMPRESSIONS  1. Left ventricular ejection fraction, by estimation, is 60 to 65%. The left ventricle has normal function. There is mild asymmetric left ventricular hypertrophy of the basal-septal segment.  2. The mitral valve is degenerative. Trivial mitral valve regurgitation.  3. Gadients across the mechancal aortic valve not measured. The aortic valve was not well visualized. There is a 23 mm St. Jude mechanical valve present in the aortic position. Procedure Date: 11/12/03. Echo findings are consistent with normal structure  and function of the aortic valve prosthesis.  4. The inferior vena cava is normal in size with greater than 50% respiratory variability, suggesting right atrial pressure of 3 mmHg.  5. No evidence of endocarditis on limited TTE. Consider TEE if clinical suspicion for endocarditis is high. FINDINGS  Left Ventricle: Left ventricular ejection fraction, by estimation, is 60 to 65%. The left ventricle has normal function. Definity contrast agent was given IV to delineate the left ventricular endocardial borders. There is  mild asymmetric left ventricular hypertrophy of the basal-septal segment. Mitral Valve: The mitral valve is degenerative in appearance. Mild mitral annular calcification. Trivial mitral valve regurgitation. Tricuspid Valve: The tricuspid valve is not well visualized. Aortic Valve: Gadients across the mechancal aortic valve not measured. The aortic valve was not well visualized. There is a 23 mm St. Jude mechanical valve present in the aortic position. Procedure Date: 11/12/03. Echo findings are consistent with normal  structure and function of the aortic valve prosthesis. Pulmonic Valve: The pulmonic valve was not well visualized. Venous: The inferior vena cava is normal in size with greater than 50% respiratory variability, suggesting right atrial pressure of 3 mmHg. Additional Comments: Spectral Doppler performed. Color Doppler performed.  Glori Bickers MD Electronically signed by  Glori Bickers MD Signature Date/Time: 04/10/2022/4:40:05 PM    Final    MR BRAIN WO CONTRAST  Result Date: 04/09/2022 CLINICAL DATA:  77 year old male with acute neurologic deficit primarily abnormal speech, now clinically resolved. Chronic left MCA and cerebellar infarcts. EXAM: MRI HEAD WITHOUT CONTRAST TECHNIQUE: Multiplanar, multiecho pulse sequences of the brain and surrounding structures were obtained without intravenous contrast. COMPARISON:  Head CT earlier today.  Brain MRI 01/25/2022. FINDINGS: Brain: Small area of left frontal operculum restricted diffusion superimposed on underlying chronic left middle MCA territory encephalomalacia. See series 5, image 86 and series 8, image 22. No significant cytotoxic edema. Adjacent encephalomalacia and laminar necrosis with mild if any hemosiderin. Intrinsic T1 hyperintensity has developed throughout the left lentiform, which appears stable on T2 and FLAIR. Questionable increased mineralization on SWI. No other restricted diffusion. Stable chronic bilateral cerebellar infarcts.  Stable gray and white matter signal otherwise. Small chronic microhemorrhages in the periventricular white matter at the left frontal horn, right occipital horn are stable on SWI. No midline shift, mass effect, evidence of mass lesion, ventriculomegaly, extra-axial collection or acute intracranial hemorrhage. Cervicomedullary junction and pituitary are within normal limits. Vascular: Major intracranial vascular flow voids are stable since last year. Skull and upper cervical spine: Stable visible cervical spine with chronic ligamentous hypertrophy about the odontoid. Visualized bone marrow signal is within normal limits. Sinuses/Orbits: Stable and negative orbits. Stable mild paranasal sinus mucosal thickening. Other: Mastoids remain clear. Grossly normal visible internal auditory structures. IMPRESSION: 1. Positive for a tiny acute cortical infarct of the left frontal operculum, within an area of chronic Left MCA middle division infarct with encephalomalacia and laminar necrosis. No acute hemorrhage or mass effect. 2. Conspicuous increased intrinsic T1 signal in the left lentiform nuclei since December. Nonspecific, but favor this is a form of Wallerian degeneration secondary to the chronic ischemia in #1. 3. Elsewhere stable noncontrast MRI appearance of the brain since last year., including chronic bilateral cerebellar infarcts and occasional punctate chronic microhemorrhages. Electronically Signed   By: Genevie Ann M.D.   On: 04/09/2022 08:58   CT HEAD WO CONTRAST  Result Date: 04/09/2022 CLINICAL DATA:  77 year old male with acute neurologic deficit. Left MCA middle division infarct last year. EXAM: CT HEAD WITHOUT CONTRAST TECHNIQUE: Contiguous axial images were obtained from the base of the skull through the vertex without intravenous contrast. RADIATION DOSE REDUCTION: This exam was performed according to the departmental dose-optimization program which includes automated exposure control, adjustment of the  mA and/or kV according to patient size and/or use of iterative reconstruction technique. COMPARISON:  Brain MRI 06/25/2021, CT head and CTA 06/24/2021. FINDINGS: Brain: Encephalomalacia corresponding to previous left MCA middle division infarct. Stable cerebral volume. No midline shift, ventriculomegaly, mass effect, evidence of mass lesion, intracranial hemorrhage or evidence of cortically based acute infarction. Small chronic bilateral cerebellar infarcts are stable from the MRI last year. Vascular: Severe Calcified atherosclerosis at the skull base. No suspicious intracranial vascular hyperdensity. Skull: No acute osseous abnormality identified. Partially visible chronic dental periapical lucency. Sinuses/Orbits: Generally mild paranasal sinus mucosal thickening is stable. Tympanic cavities and mastoids remain aerated. Other: Visualized orbits and scalp soft tissues are within normal limits. IMPRESSION: 1. No acute intracranial abnormality. 2. Chronic left MCA and cerebellar infarcts. Electronically Signed   By: Genevie Ann M.D.   On: 04/09/2022 07:26  ]  Radiology No results found.  Procedures Procedures    Medications Ordered in ED Medications - No data to display  ED Course/ Medical  Decision Making/ A&P                             Medical Decision Making Unable to self cath for 1 hour and foley removed yesterday by urology   Amount and/or Complexity of Data Reviewed Independent Historian: spouse    Details: See above  External Data Reviewed: notes.    Details: Previous notes reviewed  Labs: ordered.    Details: Urine is grossly bloody, no UTI. White count 10.7, hemoglobin slight low 12, normal platelet count.  Sodium 134, potassium 3.2 slight low, creatinine 1.32 improved   Risk Risk Details: Foley placed drainage with relief.  Changed to a leg bag.  Continue antibiotics.  Call urology for in office voiding trial stable for discharge.      Final Clinical Impression(s) / ED  Diagnoses Final diagnoses:  Urinary retention  Gross hematuria   Return for intractable cough, coughing up blood, fevers > 100.4 unrelieved by medication, shortness of breath, intractable vomiting, chest pain, shortness of breath, weakness, numbness, changes in speech, facial asymmetry, abdominal pain, passing out, Inability to tolerate liquids or food, cough, altered mental status or any concerns. No signs of systemic illness or infection. The patient is nontoxic-appearing on exam and vital signs are within normal limits.  I have reviewed the triage vital signs and the nursing notes. Pertinent labs & imaging results that were available during my care of the patient were reviewed by me and considered in my medical decision making (see chart for details). After history, exam, and medical workup I feel the patient has been appropriately medically screened and is safe for discharge home. Pertinent diagnoses were discussed with the patient. Patient was given return precautions.  Rx / DC Orders ED Discharge Orders     None         Sharrieff Spratlin, MD 04/27/22 [phone]

## 2022-04-27 NOTE — Assessment & Plan Note (Signed)
Continues coumadin per coumadin clinic with INR goal 3.0-3.5.  Most recent INR was 3/9 on 3/26.

## 2022-04-27 NOTE — ED Notes (Signed)
Called lab to add urine culture onto previously collected urine 

## 2022-04-27 NOTE — Telephone Encounter (Signed)
Devan from [facility] called stating pt missed his PT appointment today on 04/27/2022.

## 2022-04-27 NOTE — ED Triage Notes (Signed)
Hx of stroke in March (4 CVAs). Wife states intermittent aphasia at baseline. Had his foley removed yesterday at urology, self cath with no output so came here early this morning and had foley placed. Went to PCP today for hospital follow up, found to be hypotensive & was diaphoretic per wife. States feels a little lightheaded. Normotensive in triage.

## 2022-04-27 NOTE — ED Notes (Signed)
New light green top lab tube sent to lab d/t lab equipment malfunction.

## 2022-04-27 NOTE — Assessment & Plan Note (Signed)
He now has foley in place.  Wife plans to schedule a follow up with his Urologist- Dr. Diona Fanti.

## 2022-04-27 NOTE — ED Notes (Signed)
Discharge paperwork reviewed entirely with patient, including Rx's and follow up care. Pain was under control. Pt verbalized understanding as well as all parties involved. No questions or concerns voiced at the time of discharge. No acute distress noted.   Pt ambulated out to PVA without incident or assistance.  

## 2022-04-27 NOTE — ED Provider Notes (Signed)
[facility] [place] Provider Note   CSN: [id number] Arrival date & time: 04/27/22  [id number]     History  Chief Complaint  Patient presents with   Dizziness    [name] is a 77 y.o. male.  Patient seen in the emergency department last evening for urinary retention.  Followed by Dr. Diona [facility] and had Foley catheter removed yesterday but then had urinary retention had to have it placed.  Patient was nontoxic no acute distress at that time.  Patient had follow-up with primary care doctor Dr. Inda Castle.  Hand there in the office was very diaphoretic had low blood pressures.  And patient felt like they were going to pass out.  The patient had felt fine on the way to the office.  Patient's wife states that may be about 3 years ago something similar it happened.  Patient is followed by cardiology followed by urology as well.  Patient upon arrival here blood pressures were fine with systolic pressures in the 120s.  Patient completely asymptomatic.  Never had any chest pain says he feels much better.  He was given something with sugar to drink there but has been blood sugar was not low in the office.  In addition the office let us know the patient's Coumadin levels have been a little high and they have been adjusting them.  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  And then had an admission March 10 for aphasia.  With MRI showing evidence of acute infarct as well.  They felt that his Coumadin level INR was subtherapeutic.  Wife tells me that they want him to be  between 3 and 3.5.  Patient's INR today was 3.3.  Patient just discharged on March 14 from the recent recurrent stroke and aphasia.  Patient is very hard of hearing.       Home Medications Prior to Admission medications   Medication Sig Start Date End Date Taking? Authorizing Provider  potassium chloride SA (KLOR-CON M) 20 MEQ tablet Take 1 tablet (20 mEq total) by mouth 2 (two) times daily. 04/27/22  Yes Fredia Sorrow, MD  AMBULATORY NON FORMULARY MEDICATION cpap cushions            AirFit F20 (Size: Large)           AirFit F30 (Size: Med)   Please FAX the prescription to:  [phone] 02/10/20   [name], NP  amLODipine (NORVASC) 5 MG tablet TAKE 1 TABLET (5 MG TOTAL) BY MOUTH DAILY. Patient taking differently: Take 5 mg by mouth in the morning. 12/02/21 05/31/22  Lelon Perla, MD  aspirin EC 81 MG tablet Take 1 tablet (81 mg total) by mouth daily. Swallow whole. 04/14/22   Ghimire, Henreitta Leber, MD  betamethasone dipropionate (DIPROLENE) 0.05 % cream Apply topically 2 (two) times daily as needed. To eczema rash Patient taking differently: Apply 1 application  topically 2 (two) times daily as needed (to eczema/rash). 09/06/18   [name], NP  Blood Glucose Monitoring Suppl Supplies MISC Use for monitoring glucose level 08/05/18   [name], NP  cephALEXin (KEFLEX) 500 MG capsule Take 1  capsule (500 mg total) by mouth 2 (two) times daily for 7 days. 04/24/22 05/01/22  Terrilyn Saver, NP  colchicine 0.6 MG tablet TAKE 1 TABLET (0.6 MG TOTAL) BY MOUTH 2 (TWO) TIMES DAILY AS NEEDED. 11/10/21   [name], NP  Continuous Blood Gluc Sensor (DEXCOM G6 SENSOR) MISC 1 Device by Does not apply route as directed. 05/18/20   Shamleffer, Melanie Crazier, MD  dapagliflozin propanediol (FARXIGA) 10 MG TABS tablet Take 1 tablet (10 mg total) by mouth daily before breakfast. 02/13/22   Shamleffer, Melanie Crazier, MD  erythromycin ophthalmic ointment 1 Application See  admin instructions. Apply to affected eye(s) as directed for styes    [provider]  fenofibrate (TRICOR) 145 MG tablet TAKE 1 TABLET BY MOUTH EVERY DAY Patient taking differently: Take 145 mg by mouth in the morning. 11/08/21   [name], NP  furosemide (LASIX) 20 MG tablet TAKE 1 TABLET BY MOUTH EVERY DAY AS NEEDED FOR SWELLING Patient taking differently: Take 20 mg by mouth in the morning. 11/08/21   [name], NP  glucose blood (ONETOUCH VERIO) test strip Use as instructed 09/15/20   [name], NP  insulin aspart (NOVOLOG FLEXPEN) 100 UNIT/ML FlexPen Max daily 75 units Patient taking differently: Inject 14-19 Units into the skin See admin instructions. Inject 16 units into the skin with breakfast, 14 units with lunch, and 16 units with supper/evening meal- may increase to up to 19 units per dose as needed for high blood sugar 02/13/22   Shamleffer, Melanie Crazier, MD  Insulin Glargine (BASAGLAR KWIKPEN) 100 UNIT/ML Inject 22 Units into the skin daily. Patient taking differently: Inject 22 Units into the skin at bedtime. 02/13/22   Shamleffer, Melanie Crazier, MD  Insulin Pen Needle 32G X 4 MM MISC 1 Device by Does not apply route in the morning, at noon, in the evening, and at bedtime. 02/13/22   Shamleffer, Melanie Crazier, MD  isosorbide mononitrate (IMDUR) 30 MG 24 hr tablet Take 30 mg by mouth daily. 12/02/21   [provider]  ketoconazole (NIZORAL) 2 % cream Apply 1 Application topically daily as needed for irritation.    [provider]  metoprolol succinate (TOPROL-XL) 25 MG 24 hr tablet TAKE 1 TABLET BY MOUTH EVERY DAY Patient taking differently: Take 25 mg by mouth in the morning. 11/08/21   [name], NP  OneTouch Delica Lancets [id number] MISC USE AS DIRECTED 09/15/20   [name], NP  pantoprazole (PROTONIX) 40 MG tablet Take 1 tablet (40 mg total) by mouth daily. 04/13/22 04/13/23  Ghimire, Henreitta Leber, MD  PRESCRIPTION  MEDICATION CPAP- At bedtime    [provider]  rosuvastatin (CRESTOR) 40 MG tablet TAKE 1 TABLET BY MOUTH EVERY DAY Patient taking differently: Take 40 mg by mouth at bedtime. 11/30/21   Lelon Perla, MD  tacrolimus (PROTOPIC) 0.1 % ointment Apply 1 application  topically 2 (two) times daily as needed (for eczema). 11/29/20   [provider]  tamsulosin (FLOMAX) 0.4 MG CAPS capsule Take 1 capsule (0.4 mg total) by mouth daily. 01/28/22   Otelia Santee, NP  warfarin (COUMADIN) 7.5 MG tablet 3.75 mg on Monday/Friday and 7.5 mg the rest of the days. 04/13/22   Ghimire, Henreitta Leber, MD      Allergies    Patient has no known allergies.    Review of Systems   Review of Systems  Constitutional:  Positive for diaphoresis. Negative for chills and fever.  HENT:  Positive for hearing  loss. Negative for ear pain and sore throat.   Eyes:  Negative for pain and visual disturbance.  Respiratory:  Negative for cough and shortness of breath.   Cardiovascular:  Negative for chest pain and palpitations.  Gastrointestinal:  Positive for nausea. Negative for abdominal pain and vomiting.  Genitourinary:  Negative for dysuria and hematuria.  Musculoskeletal:  Negative for arthralgias and back pain.  Skin:  Negative for color change and rash.  Neurological:  Negative for seizures and syncope.  All other systems reviewed and are negative.   Physical Exam Updated Vital Signs BP (!) 136/59   Pulse 60   Temp (!) 97.4 F (36.3 C) (Oral)   Resp 20   Ht 1.778 m (5\' 10" )   SpO2 99%   BMI 30.56 kg/m  Physical Exam Vitals and nursing note reviewed.  Constitutional:      General: He is not in acute distress.    Appearance: He is well-developed.  HENT:     Head: Normocephalic and atraumatic.     Mouth/Throat:     Mouth: Mucous membranes are moist.  Eyes:     Extraocular Movements: Extraocular movements intact.     Conjunctiva/sclera: Conjunctivae normal.     Pupils: Pupils are  equal, round, and reactive to light.  Cardiovascular:     Rate and Rhythm: Normal rate and regular rhythm.     Heart sounds: No murmur heard. Pulmonary:     Effort: Pulmonary effort is normal. No respiratory distress.     Breath sounds: Normal breath sounds.  Abdominal:     Palpations: Abdomen is soft.     Tenderness: There is no abdominal tenderness.  Genitourinary:    Comments: Foley catheter in place with leg bag. Musculoskeletal:        General: No swelling.     Cervical back: Neck supple.  Skin:    General: Skin is warm and dry.     Capillary Refill: Capillary refill takes less than 2 seconds.  Neurological:     Mental Status: He is alert. Mental status is at baseline.  Psychiatric:        Mood and Affect: Mood normal.     ED Results / Procedures / Treatments   Labs (all labs ordered are listed, but only abnormal results are displayed) Labs Reviewed  CBC WITH DIFFERENTIAL/PLATELET - Abnormal; Notable for the following components:      Result Value   WBC 14.4 (*)    RBC 3.86 (*)    Hemoglobin 11.0 (*)    HCT 33.5 (*)    Neutro Abs 12.2 (*)    Abs Immature Granulocytes 0.14 (*)    All other components within normal limits  COMPREHENSIVE METABOLIC PANEL - Abnormal; Notable for the following components:   Potassium 3.1 (*)    Glucose, Bld 153 (*)    BUN 34 (*)    Creatinine, Ser 1.41 (*)    Calcium 8.4 (*)    GFR, Estimated 51 (*)    All other components within normal limits  URINALYSIS, ROUTINE W REFLEX MICROSCOPIC - Abnormal; Notable for the following components:   Color, Urine RED (*)    APPearance TURBID (*)    Glucose, UA   (*)    Value: TEST NOT REPORTED DUE TO COLOR INTERFERENCE OF URINE PIGMENT   Hgb urine dipstick   (*)    Value: TEST NOT REPORTED DUE TO COLOR INTERFERENCE OF URINE PIGMENT   Bilirubin Urine   (*)    Value:  TEST NOT REPORTED DUE TO COLOR INTERFERENCE OF URINE PIGMENT   Ketones, ur   (*)    Value: TEST NOT REPORTED DUE TO COLOR  INTERFERENCE OF URINE PIGMENT   Protein, ur   (*)    Value: TEST NOT REPORTED DUE TO COLOR INTERFERENCE OF URINE PIGMENT   Nitrite   (*)    Value: TEST NOT REPORTED DUE TO COLOR INTERFERENCE OF URINE PIGMENT   Leukocytes,Ua   (*)    Value: TEST NOT REPORTED DUE TO COLOR INTERFERENCE OF URINE PIGMENT   All other components within normal limits  PROTIME-INR - Abnormal; Notable for the following components:   Prothrombin Time 33.5 (*)    INR 3.3 (*)    All other components within normal limits  URINALYSIS, MICROSCOPIC (REFLEX) - Abnormal; Notable for the following components:   Bacteria, UA RARE (*)    All other components within normal limits  CULTURE, BLOOD (ROUTINE X 2)  CULTURE, BLOOD (ROUTINE X 2)  URINE CULTURE  LACTIC ACID, PLASMA  LACTIC ACID, PLASMA    EKG EKG Interpretation  Date/Time:  Thursday April 27 2022 10:06:57 EDT Ventricular Rate:  56 PR Interval:  204 QRS Duration: 92 QT Interval:  402 QTC Calculation: 388 R Axis:   73 Text Interpretation: Sinus rhythm Abnrm T, consider ischemia, anterolateral lds Confirmed by Fredia Sorrow [phone]) on 04/27/2022 10:09:52 AM  Radiology DG Chest Port 1 View  Result Date: 04/27/2022 CLINICAL DATA:  Hypotension EXAM: PORTABLE CHEST 1 VIEW COMPARISON:  03/21/2010 FINDINGS: Cardiomegaly status post median sternotomy and CABG. Both lungs are clear. The visualized skeletal structures are unremarkable. IMPRESSION: Cardiomegaly without acute abnormality of the lungs in AP portable projection. Electronically Signed   By: Delanna Ahmadi M.D.   On: 04/27/2022 12:09    Procedures Procedures    Medications Ordered in ED Medications  0.9 %  sodium chloride infusion ( Intravenous New Bag/Given 04/27/22 1103)  ondansetron (ZOFRAN) injection 4 mg (0 mg Intravenous Hold 04/27/22 1104)  sodium chloride 0.9 % bolus 500 mL (0 mLs Intravenous Stopped 04/27/22 1138)  potassium chloride SA (KLOR-CON M) CR tablet 40 mEq (40 mEq Oral Given 04/27/22  1216)  insulin aspart protamine- aspart (NOVOLOG MIX 70/30) injection 14 Units (14 Units Subcutaneous Given 04/27/22 1300)    ED Course/ Medical Decision Making/ A&P                             Medical Decision Making Amount and/or Complexity of Data Reviewed Labs: ordered. Radiology: ordered.  Risk Prescription drug management.   Patient never hypotensive here.  Cardiac monitoring without arrhythmia.  Patient never had any chest pain.  EKG with some subtle changes.  But nothing acute.  Patient's lactic acid here was 1.9.  Blood cultures pending.  CBC with a mild leukocytosis white count 14.4 hemoglobin 11 platelets 310.  Complete metabolic panel significant for some mild hypokalemia with potassium 3.1.  Patient given oral potassium here and will have 2 doses of oral potassium to take at home.  Patient is GFR is 51 not significantly off his baseline normally he is in the 50s.  Anion gap normal.  INR is mention 3.3 which is his target area according to his most recent admission.  Urinalysis here difficult to assess because it has a lot of blood in it.  Culture sent again.  Reviewed cultures from March 25 which grew Serratia resistant to Ancef but sensitive to Rocephin.  Not sure whether Keflex would be effective or not.  But they do not want him to take fluoroquinolones because of the aortic aneurysm.  In addition chest x-ray was negative as well.  Will wait for today's cultures to come back to see whether it is actually cleared on the Keflex patient will continue the Keflex.  Initially thought patient would require admission but he has been completely asymptomatic here now for several hours and no arrhythmias and blood pressures have been stone cold stable.  He has had food to eat patient in good spirits would prefer not to be admitted.  Patient stable for discharge home at this time with close follow-up back with urology which is already scheduled for the first part of April urine culture  repeated blood cultures pending if anything is abnormal they will be contacted.  Will continue the Keflex.  They will return for any new or worse symptoms or any recurrent near syncopal events.  Clinically that sounded like it was a vasovagal event.  But not sure what triggered it.  Patient will follow back up with cardiology back up with primary care doctor and back up with urology.   Final Clinical Impression(s) / ED Diagnoses Final diagnoses:  Hypokalemia  Near syncope  Urine retention  Hematuria, unspecified type    Rx / DC Orders ED Discharge Orders          Ordered    potassium chloride SA (KLOR-CON M) 20 MEQ tablet  2 times daily        04/27/22 1358              Fredia Sorrow, MD 04/27/22 1412

## 2022-04-27 NOTE — Assessment & Plan Note (Signed)
Rate stable.  Maintained on beta blocker and coumadin.

## 2022-04-27 NOTE — Assessment & Plan Note (Signed)
Patient's blood pressure noted to be low upon arrival.  Pt reported feeling well.  However, during visit he became diaphoretic with dry heaves and weakness.  He was given some juice today as wife said he had only had a frozen waffle early this AM. Report was given to physician on duty at the [facility] ED in [facility].  He was wheeled down by [place].

## 2022-04-27 NOTE — Discharge Instructions (Signed)
Continue the Keflex.  Keep the Foley catheter in place.  Urine culture from today is pending.  As we discussed difficult to say based on the previous urine culture whether Keflex would be effective or not.  Most likely has been.  Return for any recurrent near syncopal events.  Follow-up with your cardiologist follow-up with your urologist and make an appointment follow back up with your primary care doctor.

## 2022-04-27 NOTE — Assessment & Plan Note (Signed)
Glucose was 180 today in the office.

## 2022-04-27 NOTE — ED Notes (Signed)
CBG is 210 per pt's monitor.

## 2022-04-27 NOTE — ED Triage Notes (Signed)
Urinary retention that started tonight. Recent foley that was removed. Pt tried to self-cath ~ 1 hr pta and had scant urine output.

## 2022-04-28 LAB — URINE CULTURE: Culture: NO GROWTH

## 2022-05-01 ENCOUNTER — Ambulatory Visit: Payer: Medicare HMO | Admitting: Internal Medicine

## 2022-05-02 LAB — CULTURE, BLOOD (ROUTINE X 2)
Culture: NO GROWTH
Culture: NO GROWTH
Special Requests: ADEQUATE
Special Requests: ADEQUATE

## 2022-05-03 ENCOUNTER — Ambulatory Visit (INDEPENDENT_AMBULATORY_CARE_PROVIDER_SITE_OTHER): Payer: Medicare HMO

## 2022-05-03 DIAGNOSIS — Z952 Presence of prosthetic heart valve: Secondary | ICD-10-CM | POA: Diagnosis not present

## 2022-05-03 DIAGNOSIS — Z5181 Encounter for therapeutic drug level monitoring: Secondary | ICD-10-CM | POA: Diagnosis not present

## 2022-05-03 LAB — POCT INR: INR: 3.9 — AB (ref 2.0–3.0)

## 2022-05-03 NOTE — Patient Instructions (Signed)
Description   HOLD today's dose and then START taking 1  tablet daily except for 1/2 tablet on Sundays, Wednesday and Friday.  Continue consistency with leafy green intake 3 servings/week.  Recheck INR in 1 week.  Coumadin Clinic [phone].

## 2022-05-04 ENCOUNTER — Telehealth: Payer: Self-pay

## 2022-05-04 NOTE — Patient Outreach (Signed)
  Care Coordination   05/04/2022 Late entry for 05/04/22 10:50 am Name: [name] MRN: [id number] DOB: 08/08/1945   Care Coordination Outreach Attempts:  An unsuccessful telephone outreach was attempted for a scheduled appointment today.  Follow Up Plan:  Additional outreach attempts will be made to offer the patient care coordination information and services.   Encounter Outcome:  No Answer   Care Coordination Interventions:  No, not indicated    Thea Silversmith, RN, MSN, BSN, [facility] Coordinator [phone]

## 2022-05-08 ENCOUNTER — Ambulatory Visit: Payer: Medicare HMO | Admitting: Family

## 2022-05-08 ENCOUNTER — Telehealth: Payer: Self-pay | Admitting: *Deleted

## 2022-05-08 DIAGNOSIS — R338 Other retention of urine: Secondary | ICD-10-CM | POA: Diagnosis not present

## 2022-05-08 DIAGNOSIS — R310 Gross hematuria: Secondary | ICD-10-CM | POA: Diagnosis not present

## 2022-05-08 NOTE — Progress Notes (Signed)
  Care Coordination Note  05/08/2022 Name: LACY COUNTESS MRN: [id number] DOB: [birth year]  DAMIEAN MITSCH is a 77 y.o. year old male who is a primary care patient of Sandford Craze, NP and is actively engaged with the care management team. I reached out to Elmarie Mainland by phone today to assist with re-scheduling a follow up visit with the RN Case Manager  Follow up plan: Pt requests to call back   Burman Nieves, [facility] Care Coordination Care Guide Direct Dial: [phone]

## 2022-05-09 ENCOUNTER — Other Ambulatory Visit: Payer: Self-pay

## 2022-05-09 NOTE — Patient Outreach (Signed)
Telephone outreach to patient to obtain mRS was successfully completed. MRS=2  Nethra Mehlberg [facility] Care Management Assistant [phone]  

## 2022-05-10 ENCOUNTER — Ambulatory Visit: Payer: Medicare HMO | Attending: Cardiology

## 2022-05-10 DIAGNOSIS — Z5181 Encounter for therapeutic drug level monitoring: Secondary | ICD-10-CM

## 2022-05-10 DIAGNOSIS — R339 Retention of urine, unspecified: Secondary | ICD-10-CM | POA: Diagnosis not present

## 2022-05-10 DIAGNOSIS — N400 Enlarged prostate without lower urinary tract symptoms: Secondary | ICD-10-CM | POA: Diagnosis not present

## 2022-05-10 DIAGNOSIS — Z952 Presence of prosthetic heart valve: Secondary | ICD-10-CM | POA: Diagnosis not present

## 2022-05-10 LAB — POCT INR: INR: 3.9 — AB (ref 2.0–3.0)

## 2022-05-10 NOTE — Patient Instructions (Signed)
Description   HOLD today's dose and then START taking 0.5 tablet daily except 1 tablet on Tuesdays, Thursdays, and Saturday.  Continue consistency with leafy green intake 3 servings/week.  Recheck INR in 2 weeks.  Coumadin Clinic [phone].

## 2022-05-10 NOTE — Progress Notes (Signed)
Subjective:   By signing my name below, I, [name], attest that this documentation has been prepared under the direction and in the presence of [name], NP 05/14/22   Patient ID: [name], male    DOB: [birth year], 77 y.o.   MRN: [id number]  No chief complaint on file.   HPI Patient is in today for a 3 month follow up. On his last isit with [name] he had significant hypotention and was sent to the ER. He was noted to be hypokalemic but BP had improved by the time that he was re-evaluated in the ER.   Urinary retention: He has not needed to use a leg bag. He reports no blood in his urine for about 2 weeks. He reports his catheter was removed on Monday. Wife is helping him with In and out catheterizing.   INR:  He reports that his INR has been 3.9 this week. Goal INR is 3.0-3.5. He continues to follow up at the coumadin clinic.  His wife reports he is scheduled to follow up with [name] in 07/2022 and with neurology in 08/2022.   Past Medical History:  Diagnosis Date   Actinic keratosis 02/14/2011   Allergy    Aneurysm of ascending aorta without rupture (HCC) 01/06/2022   Aortic stenosis    Arthritis    Bilateral sensorineural hearing loss 06/16/2009   CAD (coronary artery disease)    cabg   Complication of anesthesia    Coronary atherosclerosis of native coronary artery 06/15/2009   Diabetic polyneuropathy 11/11/2018   Dyslipidemia 11/05/2018   Embolic stroke involving left middle cerebral artery  05/30/2021   Erectile dysfunction 05/18/2009   Essential hypertension 06/16/2009   Expressive aphasia 06/25/2021   External hemorrhoids 03/21/2010   Gout 04/09/2015   Heart murmur    Hemochromatosis 02/14/2011   History of aortic valve replacement 10/13/2009   History of CABG 11/05/2018   2005- 1. Coronary artery bypass grafting x4 with LIMA-LAD, SVG-OM1, SVG-AM and dCFX.  2. Aortic valve replacement with [employer] Jude AVR 3. Septal myomectomy.  4. Myoview low  risk 2016   History of hepatitis 01/31/1972   Hypertriglyceridemia 06/13/2010   Hypertrophic obstructive cardiomyopathy 05/06/2008   SP septal myomectomy at surgery 2005 Echo Nov 2018- Hyperdynamic LVEF with severe basal septal hypertrophy. There is chordal SAM with resting gradient of 16 mmHg that increases to 85 mmHg with Valsalva        Leg weakness, bilateral 01/18/2021   Long term (current) use of anticoagulants 05/22/2018   Lumbar stenosis with neurogenic claudication 02/04/2021   Malignant neoplasm of prostate 06/17/2009   Mechanical heart valve present    Manufacturer: Theresa Duty #: [id number]  Model #: [id number]. Card states MRI compatible with 3 teslas or less.   Obesity, unspecified 04/29/2008   OSA (obstructive sleep apnea) 07/12/2012   moderate-severe; uses CPAP nightly   PONV (postoperative nausea and vomiting)    after CABG- slow to wake up   Presence of prosthetic heart valve    Primary osteoarthritis of left shoulder 10/31/2018   S/P lumbar laminectomy 02/04/2021   Stage 3 chronic kidney disease due to type 2 diabetes mellitus 11/11/2018   Tobacco use 06/16/2009   Type 2 diabetes mellitus with hyperglycemia    Ventral hernia 03/03/2014    Past Surgical History:  Procedure Laterality Date   AORTIC VALVE REPLACEMENT  11/14/2003   [facility]   APPENDECTOMY  1990   CHOLECYSTECTOMY  1990  CORONARY ANGIOGRAPHY  10/11/2021   CORONARY ARTERY BYPASS GRAFT  10/2003   HERNIA REPAIR  1999   right, inguinal   HERNIA REPAIR  2002   left, inguinal   HIP SURGERY  2006   right hip   IR CT HEAD LTD  02/03/2022   IR PERCUTANEOUS ART THROMBECTOMY/INFUSION INTRACRANIAL INC DIAG ANGIO  01/24/2022   LUMBAR LAMINECTOMY/DECOMPRESSION MICRODISCECTOMY Bilateral 02/04/2021   Procedure: Bilateral Lumbar Two-Three Laminectomy;  Surgeon: [name], MD;  Location: [facility] OR;  Service: Neurosurgery;  Laterality: Bilateral;  3C/RM 20   PILONIDAL CYST EXCISION  1964   prostate seed  implant  03/2010   RADIOLOGY WITH ANESTHESIA N/A 01/24/2022   Procedure: IR WITH ANESTHESIA;  Surgeon: Radiologist, Medication, MD;  Location: [facility] OR;  Service: Radiology;  Laterality: N/A;   TEE WITHOUT CARDIOVERSION N/A 03/14/2022   Procedure: TRANSESOPHAGEAL ECHOCARDIOGRAM (TEE);  Surgeon: Parke Poisson, MD;  Location: [facility] ENDOSCOPY;  Service: Cardiology;  Laterality: N/A;   TONSILLECTOMY  childhood    Family History  Problem Relation Age of Onset   Heart disease Mother    Stroke Mother    Heart disease Father    Asperger's syndrome Son    Hyperlipidemia Son    Coronary artery disease Brother    Cancer Neg Hx        negative for colon cancer    Social History   Socioeconomic History   Marital status: Married    Spouse name: Not on file   Number of children: Not on file   Years of education: 18   Highest education level: Master's degree (e.g., MA, MS, MEng, MEd, MSW, MBA)  Occupational History   Occupation: Retired  Tobacco Use   Smoking status: Some Days    Types: Cigars    Last attempt to quit: 06/2021    Years since quitting: 0.8   Smokeless tobacco: Never   Tobacco comments:    Pt states he smokes 1 cigar about once a month. 06/01/2021    Pt quit  Cigars 06/2021  Substance and Sexual Activity   Alcohol use: Not Currently    Comment: wine maybe 1 per month   Drug use: No   Sexual activity: Never  Other Topics Concern   Not on file  Social History Narrative   ** Merged History Encounter ** Right Handed    Lives in a one sotyr home. One step leads to front     Lives with wife   Retired   Occasionally caffeine   Social Determinants of Health   Financial Resource Strain: Low Risk  (04/20/2022)   Overall Financial Resource Strain (CARDIA)    Difficulty of Paying Living Expenses: Not hard at all  Food Insecurity: No Food Insecurity (04/20/2022)   Hunger Vital Sign    Worried About Running Out of Food in the Last Year: Never true    Ran Out of Food in the Last  Year: Never true  Transportation Needs: Unmet Transportation Needs (04/20/2022)   PRAPARE - Administrator, Civil Service (Medical): Yes    Lack of Transportation (Non-Medical): Yes  Physical Activity: Unknown (04/20/2022)   Exercise Vital Sign    Days of Exercise per Week: 0 days    Minutes of Exercise per Session: Not on file  Stress: No Stress Concern Present (04/20/2022)   [employer] of Occupational Health - Occupational Stress Questionnaire    Feeling of Stress : Not at all  Social Connections: Moderately Integrated (04/20/2022)  Social Advertising account executiveConnection and Isolation Panel [NHANES]    Frequency of Communication with Friends and Family: Once a week    Frequency of Social Gatherings with Friends and Family: Once a week    Attends Religious Services: More than 4 times per year    Active Member of [employer] Financial[employer]ubs or Organizations: Yes    Attends BankerClub or Organization Meetings: 1 to 4 times per year    Marital Status: Married  Catering managerntimate Partner Violence: Not on file    Outpatient Medications Prior to Visit  Medication Sig Dispense Refill   AMBULATORY NON FORMULARY MEDICATION cpap cushions            AirFit F20 (Size: Large)           AirFit F30 (Size: Med)   Please FAX the prescription to:  [id number] 12 each prn   amLODipine (NORVASC) 5 MG tablet TAKE 1 TABLET (5 MG TOTAL) BY MOUTH DAILY. (Patient taking differently: Take 5 mg by mouth in the morning.) 90 tablet 1   aspirin EC 81 MG tablet Take 1 tablet (81 mg total) by mouth daily. Swallow whole. 30 tablet 12   betamethasone dipropionate (DIPROLENE) 0.05 % cream Apply topically 2 (two) times daily as needed. To eczema rash (Patient taking differently: Apply 1 application  topically 2 (two) times daily as needed (to eczema/rash).) 30 g 1   Blood Glucose Monitoring Suppl Supplies MISC Use for monitoring glucose level 100 each 1   colchicine 0.6 MG tablet TAKE 1 TABLET (0.6 MG TOTAL) BY MOUTH 2 (TWO) TIMES DAILY AS NEEDED. 180  tablet 0   Continuous Blood Gluc Sensor (DEXCOM G6 SENSOR) MISC 1 Device by Does not apply route as directed. 9 each 3   dapagliflozin propanediol (FARXIGA) 10 MG TABS tablet Take 1 tablet (10 mg total) by mouth daily before breakfast. 90 tablet 3   erythromycin ophthalmic ointment 1 Application See admin instructions. Apply to affected eye(s) as directed for styes     fenofibrate (TRICOR) 145 MG tablet TAKE 1 TABLET BY MOUTH EVERY DAY (Patient taking differently: Take 145 mg by mouth in the morning.) 90 tablet 1   furosemide (LASIX) 20 MG tablet TAKE 1 TABLET BY MOUTH EVERY DAY AS NEEDED FOR SWELLING (Patient taking differently: Take 20 mg by mouth in the morning.) 90 tablet 1   glucose blood (ONETOUCH VERIO) test strip Use as instructed 100 strip 12   insulin aspart (NOVOLOG FLEXPEN) 100 UNIT/ML FlexPen Max daily 75 units (Patient taking differently: Inject 14-19 Units into the skin See admin instructions. Inject 16 units into the skin with breakfast, 14 units with lunch, and 16 units with supper/evening meal- may increase to up to 19 units per dose as needed for high blood sugar) 75 mL 3   Insulin Glargine (BASAGLAR KWIKPEN) 100 UNIT/ML Inject 22 Units into the skin daily. (Patient taking differently: Inject 22 Units into the skin at bedtime.) 30 mL 3   Insulin Pen Needle 32G X 4 MM MISC 1 Device by Does not apply route in the morning, at noon, in the evening, and at bedtime. 400 each 3   isosorbide mononitrate (IMDUR) 30 MG 24 hr tablet Take 30 mg by mouth daily.     ketoconazole (NIZORAL) 2 % cream Apply 1 Application topically daily as needed for irritation.     metoprolol succinate (TOPROL-XL) 25 MG 24 hr tablet TAKE 1 TABLET BY MOUTH EVERY DAY (Patient taking differently: Take 25 mg by mouth in the morning.) 90 tablet 1  OneTouch Delica Lancets 33G MISC USE AS DIRECTED 100 each 1   pantoprazole (PROTONIX) 40 MG tablet Take 1 tablet (40 mg total) by mouth daily. 30 tablet 1   potassium  chloride SA (KLOR-CON M) 20 MEQ tablet Take 1 tablet (20 mEq total) by mouth 2 (two) times daily. 2 tablet 0   PRESCRIPTION MEDICATION CPAP- At bedtime     rosuvastatin (CRESTOR) 40 MG tablet TAKE 1 TABLET BY MOUTH EVERY DAY (Patient taking differently: Take 40 mg by mouth at bedtime.) 90 tablet 3   tacrolimus (PROTOPIC) 0.1 % ointment Apply 1 application  topically 2 (two) times daily as needed (for eczema).     tamsulosin (FLOMAX) 0.4 MG CAPS capsule Take 1 capsule (0.4 mg total) by mouth daily. 30 capsule 0   warfarin (COUMADIN) 7.5 MG tablet 3.75 mg on Monday/Friday and 7.5 mg the rest of the days. 105 tablet 1   No facility-administered medications prior to visit.    No Known Allergies  ROS See HPI    Objective:    Physical Exam Constitutional:      General: He is not in acute distress.    Appearance: He is well-developed.  HENT:     Head: Normocephalic and atraumatic.  Cardiovascular:     Rate and Rhythm: Normal rate and regular rhythm.     Heart sounds: No murmur heard.    Comments: Mechanial click noted on cardio vascular Pulmonary:     Effort: Pulmonary effort is normal. No respiratory distress.     Breath sounds: Normal breath sounds. No wheezing or rales.  Musculoskeletal:     Right lower leg: 3+ Edema present.     Left lower leg: 3+ Edema present.  Skin:    General: Skin is warm and dry.  Neurological:     Mental Status: He is alert and oriented to person, place, and time.     Comments: Hard of hearing  Psychiatric:        Behavior: Behavior normal.        Thought Content: Thought content normal.     BP (!) 128/46 (BP Location: Right Arm, Patient Position: Sitting, Cuff Size: Large)   Pulse 61   Temp 98.1 F (36.7 C) (Oral)   Resp 16   Wt 217 lb (98.4 kg)   SpO2 99%   BMI 31.14 kg/m  Wt Readings from Last 3 Encounters:  05/12/22 217 lb (98.4 kg)  04/27/22 213 lb (96.6 kg)  04/27/22 213 lb 13.5 oz (97 kg)       Assessment & Plan:  Hypokalemia -      Basic metabolic panel  Urinary retention Assessment & Plan: Stable with in and out cathing.    Type 2 diabetes mellitus with stage 3a chronic kidney disease, with long-term current use of insulin Assessment & Plan: Lab Results  Component Value Date   HGBA1C 7.3 (H) 04/10/2022   HGBA1C 8.2 (H) 01/24/2022   HGBA1C 7.7 (A) 10/28/2021   Lab Results  Component Value Date   MICROALBUR <0.7 10/04/2021   LDLCALC 56 04/10/2022   CREATININE 1.50 (H) 05/12/2022   Last A1C was slightly above goal but had improved from previous A1C- defer management to endocrinology.    H/O: CVA (cerebrovascular accident) Assessment & Plan: Biggest issue for him now is keep his INR therapeutic.  This is being closely monitored by the coumadin clinic.  Goal INR is 3.0-3.5. He is approaching his baseline neuro status.  Wife notes that he can be  more forgetful at times but his expressive aphasia has basically resolved.    Dyslipidemia Assessment & Plan: Lab Results  Component Value Date   CHOL 136 04/10/2022   HDL 30 (L) 04/10/2022   LDLCALC 56 04/10/2022   LDLDIRECT 53 01/26/2022   TRIG 252 (H) 04/10/2022   CHOLHDL 4.5 04/10/2022   LDL at goal. Continue statin/fenofibrate.   Essential hypertension Assessment & Plan: BP Readings from Last 3 Encounters:  05/12/22 (!) 128/46  04/27/22 (!) 128/59  04/27/22 (!) 68/53   BP looks good today.  He continues amlodipine, imdur       I,Rachel Rivera,acting as a Neurosurgeon for [name], NP.,have documented all relevant documentation on the behalf of [name], NP,as directed by  [name], NP while in the presence of [name], NP.   I, [name], NP, personally preformed the services described in this documentation.  All medical record entries made by the scribe were at my direction and in my presence.  I have reviewed the chart and discharge instructions (if applicable) and agree that the record  reflects my personal performance and is accurate and complete. 05/14/22   [name], NP

## 2022-05-12 ENCOUNTER — Ambulatory Visit (INDEPENDENT_AMBULATORY_CARE_PROVIDER_SITE_OTHER): Payer: Medicare HMO | Admitting: Family

## 2022-05-12 VITALS — BP 128/46 | HR 61 | Temp 98.1°F | Resp 16 | Wt 217.0 lb

## 2022-05-12 DIAGNOSIS — I10 Essential (primary) hypertension: Secondary | ICD-10-CM

## 2022-05-12 DIAGNOSIS — Z8673 Personal history of transient ischemic attack (TIA), and cerebral infarction without residual deficits: Secondary | ICD-10-CM

## 2022-05-12 DIAGNOSIS — R339 Retention of urine, unspecified: Secondary | ICD-10-CM | POA: Diagnosis not present

## 2022-05-12 DIAGNOSIS — E785 Hyperlipidemia, unspecified: Secondary | ICD-10-CM | POA: Diagnosis not present

## 2022-05-12 DIAGNOSIS — E1122 Type 2 diabetes mellitus with diabetic chronic kidney disease: Secondary | ICD-10-CM

## 2022-05-12 DIAGNOSIS — E876 Hypokalemia: Secondary | ICD-10-CM | POA: Diagnosis not present

## 2022-05-12 DIAGNOSIS — Z794 Long term (current) use of insulin: Secondary | ICD-10-CM

## 2022-05-12 DIAGNOSIS — N1831 Chronic kidney disease, stage 3a: Secondary | ICD-10-CM | POA: Diagnosis not present

## 2022-05-12 NOTE — Progress Notes (Signed)
  Care Coordination Note  05/12/2022 Name: [name] MRN: [id number] DOB: Feb 06, 1945  [name] is a 77 y.o. year old male who is a primary care patient of Sandford Craze, NP and is actively engaged with the care management team. I reached out to Elmarie Mainland by phone today to assist with re-scheduling a follow up visit with the RN Case Manager  Follow up plan: We have been unable to make contact with the patient for follow up.   Burman Nieves, CCMA Care Coordination Care Guide Direct Dial: [phone]

## 2022-05-13 LAB — BASIC METABOLIC PANEL
BUN/Creatinine Ratio: 24 (calc) — ABNORMAL HIGH (ref 6–22)
BUN: 36 mg/dL — ABNORMAL HIGH (ref 7–25)
CO2: 22 mmol/L (ref 20–32)
Calcium: 8.7 mg/dL (ref 8.6–10.3)
Chloride: 103 mmol/L (ref 98–110)
Creat: 1.5 mg/dL — ABNORMAL HIGH (ref 0.70–1.28)
Glucose, Bld: 198 mg/dL — ABNORMAL HIGH (ref 65–99)
Potassium: 3.7 mmol/L (ref 3.5–5.3)
Sodium: 138 mmol/L (ref 135–146)

## 2022-05-14 NOTE — Assessment & Plan Note (Signed)
BP Readings from Last 3 Encounters:  05/12/22 (!) 128/46  04/27/22 (!) 128/59  04/27/22 (!) 68/53   BP looks good today.  He continues amlodipine, imdur

## 2022-05-14 NOTE — Assessment & Plan Note (Addendum)
Biggest issue for him now is keep his INR therapeutic.  This is being closely monitored by the coumadin clinic.  Goal INR is 3.0-3.5. He is approaching his baseline neuro status.  Wife notes that he can be more forgetful at times but his expressive aphasia has basically resolved.

## 2022-05-14 NOTE — Assessment & Plan Note (Addendum)
Lab Results  Component Value Date   HGBA1C 7.3 (H) 04/10/2022   HGBA1C 8.2 (H) 01/24/2022   HGBA1C 7.7 (A) 10/28/2021   Lab Results  Component Value Date   MICROALBUR <0.7 10/04/2021   LDLCALC 56 04/10/2022   CREATININE 1.50 (H) 05/12/2022   Last A1C was slightly above goal but had improved from previous A1C- defer management to endocrinology.

## 2022-05-14 NOTE — Assessment & Plan Note (Addendum)
Lab Results  Component Value Date   CHOL 136 04/10/2022   HDL 30 (L) 04/10/2022   LDLCALC 56 04/10/2022   LDLDIRECT 53 01/26/2022   TRIG 252 (H) 04/10/2022   CHOLHDL 4.5 04/10/2022   LDL at goal. Continue statin/fenofibrate.

## 2022-05-14 NOTE — Assessment & Plan Note (Signed)
Stable with in and out cathing.

## 2022-05-15 ENCOUNTER — Encounter: Payer: Self-pay | Admitting: *Deleted

## 2022-05-17 DIAGNOSIS — N3000 Acute cystitis without hematuria: Secondary | ICD-10-CM | POA: Diagnosis not present

## 2022-05-17 DIAGNOSIS — R311 Benign essential microscopic hematuria: Secondary | ICD-10-CM | POA: Diagnosis not present

## 2022-05-17 DIAGNOSIS — R338 Other retention of urine: Secondary | ICD-10-CM | POA: Diagnosis not present

## 2022-05-19 DIAGNOSIS — E119 Type 2 diabetes mellitus without complications: Secondary | ICD-10-CM | POA: Diagnosis not present

## 2022-05-23 ENCOUNTER — Encounter: Payer: Self-pay | Admitting: Family

## 2022-05-23 ENCOUNTER — Encounter: Payer: Self-pay | Admitting: Cardiology

## 2022-05-23 NOTE — Telephone Encounter (Signed)
Spoke to patient's wife. She reports that patient had a urine culture done at Dr. Lenoria Chime office and the only oral antibiotic that it is sensitive to is cipro. Other options are IV.  Advised wife that I think benefit outweighs risk in taking cipro, but that they need to let the coumadin clinic know tomorrow so that they can watch his INR more closely.  She verbalizes understanding.

## 2022-05-24 NOTE — Telephone Encounter (Signed)
FYI. He comes to coumadin Clinic tomorrow. Tks.

## 2022-05-25 ENCOUNTER — Ambulatory Visit: Payer: Medicare HMO | Attending: Cardiology | Admitting: *Deleted

## 2022-05-25 ENCOUNTER — Encounter: Payer: Self-pay | Admitting: Family

## 2022-05-25 DIAGNOSIS — Z8673 Personal history of transient ischemic attack (TIA), and cerebral infarction without residual deficits: Secondary | ICD-10-CM

## 2022-05-25 DIAGNOSIS — Z7901 Long term (current) use of anticoagulants: Secondary | ICD-10-CM

## 2022-05-25 DIAGNOSIS — Z952 Presence of prosthetic heart valve: Secondary | ICD-10-CM | POA: Diagnosis not present

## 2022-05-25 LAB — POCT INR: INR: 1.7 — AB (ref 2.0–3.0)

## 2022-05-25 NOTE — Patient Instructions (Addendum)
Description   Since you are taking Bactrim DS twice a day x 10 days then continue taking 1/2 tablet daily except 1 tablet on Tuesdays, Thursdays, and Saturday.  Continue consistency with leafy green intake 3 servings/week.  Recheck INR on Monday. Coumadin Clinic [phone].

## 2022-05-29 ENCOUNTER — Ambulatory Visit: Payer: Medicare HMO | Attending: Cardiology | Admitting: Pharmacist

## 2022-05-29 DIAGNOSIS — N1832 Chronic kidney disease, stage 3b: Secondary | ICD-10-CM | POA: Diagnosis not present

## 2022-05-29 DIAGNOSIS — R339 Retention of urine, unspecified: Secondary | ICD-10-CM | POA: Diagnosis not present

## 2022-05-29 DIAGNOSIS — C61 Malignant neoplasm of prostate: Secondary | ICD-10-CM | POA: Diagnosis not present

## 2022-05-29 DIAGNOSIS — E1122 Type 2 diabetes mellitus with diabetic chronic kidney disease: Secondary | ICD-10-CM | POA: Diagnosis not present

## 2022-05-29 DIAGNOSIS — Z7901 Long term (current) use of anticoagulants: Secondary | ICD-10-CM | POA: Diagnosis not present

## 2022-05-29 DIAGNOSIS — N400 Enlarged prostate without lower urinary tract symptoms: Secondary | ICD-10-CM | POA: Diagnosis not present

## 2022-05-29 DIAGNOSIS — Z952 Presence of prosthetic heart valve: Secondary | ICD-10-CM

## 2022-05-29 DIAGNOSIS — I639 Cerebral infarction, unspecified: Secondary | ICD-10-CM | POA: Diagnosis not present

## 2022-05-29 DIAGNOSIS — I2510 Atherosclerotic heart disease of native coronary artery without angina pectoris: Secondary | ICD-10-CM | POA: Diagnosis not present

## 2022-05-29 DIAGNOSIS — I129 Hypertensive chronic kidney disease with stage 1 through stage 4 chronic kidney disease, or unspecified chronic kidney disease: Secondary | ICD-10-CM | POA: Diagnosis not present

## 2022-05-29 LAB — POCT INR: INR: 3.5 — AB (ref 2.0–3.0)

## 2022-05-29 NOTE — Patient Instructions (Signed)
Description   Hold dose today and then continue taking 1/2 tablet daily except 1 tablet on Tuesdays, Thursdays, and Saturday.  Continue consistency with leafy green intake 3 servings/week.  Recheck INR on Monday. Coumadin Clinic [phone].

## 2022-05-30 ENCOUNTER — Encounter: Payer: Self-pay | Admitting: Family

## 2022-05-30 ENCOUNTER — Telehealth: Payer: Self-pay | Admitting: Family

## 2022-05-30 DIAGNOSIS — N1831 Chronic kidney disease, stage 3a: Secondary | ICD-10-CM | POA: Diagnosis not present

## 2022-05-30 DIAGNOSIS — Z7901 Long term (current) use of anticoagulants: Secondary | ICD-10-CM | POA: Diagnosis not present

## 2022-05-30 DIAGNOSIS — I129 Hypertensive chronic kidney disease with stage 1 through stage 4 chronic kidney disease, or unspecified chronic kidney disease: Secondary | ICD-10-CM | POA: Diagnosis not present

## 2022-05-30 DIAGNOSIS — E1142 Type 2 diabetes mellitus with diabetic polyneuropathy: Secondary | ICD-10-CM | POA: Diagnosis not present

## 2022-05-30 DIAGNOSIS — Z7984 Long term (current) use of oral hypoglycemic drugs: Secondary | ICD-10-CM | POA: Diagnosis not present

## 2022-05-30 DIAGNOSIS — I69953 Hemiplegia and hemiparesis following unspecified cerebrovascular disease affecting right non-dominant side: Secondary | ICD-10-CM | POA: Diagnosis not present

## 2022-05-30 DIAGNOSIS — R339 Retention of urine, unspecified: Secondary | ICD-10-CM | POA: Diagnosis not present

## 2022-05-30 DIAGNOSIS — E785 Hyperlipidemia, unspecified: Secondary | ICD-10-CM | POA: Diagnosis not present

## 2022-05-30 DIAGNOSIS — Z8673 Personal history of transient ischemic attack (TIA), and cerebral infarction without residual deficits: Secondary | ICD-10-CM

## 2022-05-30 DIAGNOSIS — Z794 Long term (current) use of insulin: Secondary | ICD-10-CM | POA: Diagnosis not present

## 2022-05-30 DIAGNOSIS — E1165 Type 2 diabetes mellitus with hyperglycemia: Secondary | ICD-10-CM | POA: Diagnosis not present

## 2022-05-30 NOTE — Telephone Encounter (Signed)
Caller/Agency: [facility]) Callback Number: [phone] Requesting OT/PT/Skilled Nursing/Social Work/Speech Therapy: PT Frequency: 2 w 2, 1 w 1

## 2022-06-01 ENCOUNTER — Other Ambulatory Visit: Payer: Self-pay | Admitting: Cardiology

## 2022-06-01 NOTE — Telephone Encounter (Signed)
Verbal order given, ok to precede with PT

## 2022-06-02 ENCOUNTER — Ambulatory Visit: Payer: Medicare HMO | Attending: Cardiology | Admitting: *Deleted

## 2022-06-02 ENCOUNTER — Other Ambulatory Visit: Payer: Self-pay | Admitting: Cardiology

## 2022-06-02 DIAGNOSIS — Z952 Presence of prosthetic heart valve: Secondary | ICD-10-CM | POA: Diagnosis not present

## 2022-06-02 DIAGNOSIS — Z5181 Encounter for therapeutic drug level monitoring: Secondary | ICD-10-CM

## 2022-06-02 LAB — POCT INR: POC INR: 3.5

## 2022-06-02 NOTE — Patient Instructions (Signed)
Description   Continue taking 1/2 tablet daily except 1 tablet on Tuesdays, Thursdays, and Saturday.  Continue consistency with leafy green intake 3 servings/week.  Recheck INR in 1 week. Coumadin Clinic [phone].

## 2022-06-09 ENCOUNTER — Ambulatory Visit: Payer: Medicare HMO | Attending: Cardiology | Admitting: *Deleted

## 2022-06-09 DIAGNOSIS — Z952 Presence of prosthetic heart valve: Secondary | ICD-10-CM

## 2022-06-09 DIAGNOSIS — Z5181 Encounter for therapeutic drug level monitoring: Secondary | ICD-10-CM | POA: Diagnosis not present

## 2022-06-09 LAB — POCT INR: POC INR: 3

## 2022-06-09 NOTE — Patient Instructions (Signed)
Description   Continue taking 1/2 tablet daily except 1 tablet on Tuesdays, Thursdays, and Saturday.  Continue consistency with leafy green intake 3 servings/week.  Recheck INR in 2 weeks. Coumadin Clinic [phone].

## 2022-06-13 ENCOUNTER — Encounter: Payer: Self-pay | Admitting: Family

## 2022-06-13 DIAGNOSIS — R338 Other retention of urine: Secondary | ICD-10-CM | POA: Diagnosis not present

## 2022-06-13 DIAGNOSIS — R8279 Other abnormal findings on microbiological examination of urine: Secondary | ICD-10-CM | POA: Diagnosis not present

## 2022-06-13 DIAGNOSIS — R310 Gross hematuria: Secondary | ICD-10-CM | POA: Diagnosis not present

## 2022-06-13 DIAGNOSIS — N3000 Acute cystitis without hematuria: Secondary | ICD-10-CM | POA: Diagnosis not present

## 2022-06-14 NOTE — Telephone Encounter (Signed)
[employer] [phone]  They need to know if the Dexcom is going to be dispensed by a DMV vendor or picking it up at a pharmacy. Please advise.

## 2022-06-14 NOTE — Telephone Encounter (Signed)
Per patient spouse they found receiver and no longer need one sent

## 2022-06-19 DIAGNOSIS — E119 Type 2 diabetes mellitus without complications: Secondary | ICD-10-CM | POA: Diagnosis not present

## 2022-06-21 DIAGNOSIS — R310 Gross hematuria: Secondary | ICD-10-CM | POA: Diagnosis not present

## 2022-06-21 DIAGNOSIS — Z8546 Personal history of malignant neoplasm of prostate: Secondary | ICD-10-CM | POA: Diagnosis not present

## 2022-06-21 DIAGNOSIS — R338 Other retention of urine: Secondary | ICD-10-CM | POA: Diagnosis not present

## 2022-06-22 DIAGNOSIS — I10 Essential (primary) hypertension: Secondary | ICD-10-CM | POA: Diagnosis not present

## 2022-06-22 DIAGNOSIS — G4733 Obstructive sleep apnea (adult) (pediatric): Secondary | ICD-10-CM | POA: Diagnosis not present

## 2022-06-22 DIAGNOSIS — N400 Enlarged prostate without lower urinary tract symptoms: Secondary | ICD-10-CM | POA: Diagnosis not present

## 2022-06-22 DIAGNOSIS — R339 Retention of urine, unspecified: Secondary | ICD-10-CM | POA: Diagnosis not present

## 2022-06-23 ENCOUNTER — Ambulatory Visit: Payer: Medicare HMO | Attending: Cardiology | Admitting: *Deleted

## 2022-06-23 DIAGNOSIS — Z7901 Long term (current) use of anticoagulants: Secondary | ICD-10-CM | POA: Diagnosis not present

## 2022-06-23 DIAGNOSIS — Z952 Presence of prosthetic heart valve: Secondary | ICD-10-CM | POA: Diagnosis not present

## 2022-06-23 LAB — POCT INR: INR: 3.4 — AB (ref 2.0–3.0)

## 2022-06-23 NOTE — Patient Instructions (Addendum)
Description   Continue taking then dose you have been taking, which is, 1 tablet daily except 1/2 tablet on Mondays, Wednesdays, and Fridays.  Continue consistency with leafy green intake 3 servings/week.  Recheck INR in 2 weeks. Coumadin Clinic [phone].

## 2022-06-28 ENCOUNTER — Other Ambulatory Visit: Payer: Self-pay | Admitting: Family

## 2022-06-28 DIAGNOSIS — E1159 Type 2 diabetes mellitus with other circulatory complications: Secondary | ICD-10-CM

## 2022-06-29 ENCOUNTER — Ambulatory Visit (INDEPENDENT_AMBULATORY_CARE_PROVIDER_SITE_OTHER): Payer: Medicare HMO | Admitting: Internal Medicine

## 2022-06-29 ENCOUNTER — Encounter: Payer: Self-pay | Admitting: Internal Medicine

## 2022-06-29 VITALS — BP 120/70 | HR 57 | Ht 70.0 in | Wt 217.0 lb

## 2022-06-29 DIAGNOSIS — Z794 Long term (current) use of insulin: Secondary | ICD-10-CM

## 2022-06-29 DIAGNOSIS — N1831 Chronic kidney disease, stage 3a: Secondary | ICD-10-CM

## 2022-06-29 DIAGNOSIS — E1122 Type 2 diabetes mellitus with diabetic chronic kidney disease: Secondary | ICD-10-CM | POA: Diagnosis not present

## 2022-06-29 DIAGNOSIS — E1165 Type 2 diabetes mellitus with hyperglycemia: Secondary | ICD-10-CM

## 2022-06-29 DIAGNOSIS — E1142 Type 2 diabetes mellitus with diabetic polyneuropathy: Secondary | ICD-10-CM | POA: Diagnosis not present

## 2022-06-29 DIAGNOSIS — E1159 Type 2 diabetes mellitus with other circulatory complications: Secondary | ICD-10-CM | POA: Diagnosis not present

## 2022-06-29 LAB — POCT GLYCOSYLATED HEMOGLOBIN (HGB A1C): Hemoglobin A1C: 7.1 % — AB (ref 4.0–5.6)

## 2022-06-29 MED ORDER — SEMAGLUTIDE(0.25 OR 0.5MG/DOS) 2 MG/3ML ~~LOC~~ SOPN: 0.5000 mg | PEN_INJECTOR | SUBCUTANEOUS | 3 refills | Status: DC

## 2022-06-29 NOTE — Patient Instructions (Addendum)
-   Start Ozempic 0.25 mg weekly for 6 weeks, than increase to 0.5 mg weekly  - Continue Farxiga 10 mg , 1 tablet every morning  - Decrease  Basaglar 20 units daily  - Change  Novolog to 16 units with Breakfast, 14 units with Lunch and 16 units with Supper   Novolog correctional insulin: ADD extra units on insulin to your meal-time Novolog dose if your blood sugars are higher than 185. Use the scale below to help guide you:   Blood sugar before meal Number of units to inject  Less than 185 0 unit  186 -  210 1 units  211 -  235 2 units  236 -  260 3 units  261 -  285 4 units  286 -  310 5 units  311 -  335 6 units  336 -  360 7 units  361 -  385 8 units  386 - 410  9 units       HOW TO TREAT LOW BLOOD SUGARS (Blood sugar LESS THAN 70 MG/DL) Please follow the RULE OF 15 for the treatment of hypoglycemia treatment (when your (blood sugars are less than 70 mg/dL)   STEP 1: Take 15 grams of carbohydrates when your blood sugar is low, which includes:  3-4 GLUCOSE TABS  OR 3-4 OZ OF JUICE OR REGULAR SODA OR ONE TUBE OF GLUCOSE GEL    STEP 2: RECHECK blood sugar in 15 MINUTES STEP 3: If your blood sugar is still low at the 15 minute recheck --> then, go back to STEP 1 and treat AGAIN with another 15 grams of carbohydrates.

## 2022-06-29 NOTE — Progress Notes (Signed)
Name: [name]  Age/ Sex: 77 y.o., male   MRN/ DOB: [id number], 11/18/1945     PCP: Sandford Craze, NP   Reason for Endocrinology Evaluation: Type 2 Diabetes Mellitus  Initial Endocrine Consultative Visit: 11/11/2018    PATIENT IDENTIFIER: [name] is a 77 y.o. male with a past medical history of T2DM, OSA, HTN, CAD ( S/P CABG) . The patient has followed with Endocrinology clinic since 11/11/2018 for consultative assistance with management of his diabetes.  DIABETIC HISTORY:  [name] was diagnosed with T2DM in 2005. He was on metformin which was stopped due to renal dysfunction . Has been on insulin since 2015. His hemoglobin A1c has ranged from 6.3% in 2015, peaking at 8.3% in 2020.  On his initial visit to our clinic he was on basal insulin only. With an A1c of 8.3. Trulicity was added.    Novolog started 04/2020 due to an A1c 9.6%  Stopped trulicity due to cost  Started Farxiga 07/2020 through pt assistance program   SUBJECTIVE:   During the last visit (02/10/2022): A1c of 8.2%.    Today (06/29/2022): [name] is here for a  follow up on diabetes management. He is accompanied by his wife today . He checks his blood sugars multiple  times daily, through dexcom.  The patient has not had hypoglycemic episodes since the last clinic visit.  Since his last visit here he has had multiple ED visits for variable reasons including hematuria, urinary retention as well as aphasia in March 2024  Patient continues to follow-up with cardiology and neurology as well as urology, he is off foley, on catheter 2- 4 x a day    Denies nausea, constipation or diarrhea    HOME DIABETES REGIMEN:  Basaglar 22  units daily  Novolog 16/14/16 units TIDQAC Farxiga 10 mg daily  Cf: Novlog (bG-130/25)      CONTINUOUS GLUCOSE MONITORING RECORD INTERPRETATION    Dates of Recording: 5/17 - 06/29/2022  Sensor description: dexcom   Results statistics:   CGM use %  of time 93  Average and SD 186/46  Time in range 50 %  % Time Above 180 41  % Time above 250 8  % Time Below target <1      Glycemic patterns summary: Bg's optimal overnight, and trend upwards during the day Hyperglycemic episodes  postprandial   Hypoglycemic episodes occurred  N/A  Overnight periods: optimal     DIABETIC COMPLICATIONS: Microvascular complications:  CKDIII- Dr. Signe Colt  Denies: neuropathy, retinopathy  Last eye exam: Completed 03/25/2020    Macrovascular complications:  CAD (S/P CABG )  Denies: PVD, CVA    HISTORY:  Past Medical History:  Past Medical History:  Diagnosis Date   Actinic keratosis 02/14/2011   Allergy    Aneurysm of ascending aorta without rupture (HCC) 01/06/2022   Aortic stenosis    Arthritis    Bilateral sensorineural hearing loss 06/16/2009   CAD (coronary artery disease)    cabg   Complication of anesthesia    Coronary atherosclerosis of native coronary artery 06/15/2009   Diabetic polyneuropathy 11/11/2018   Dyslipidemia 11/05/2018   Embolic stroke involving left middle cerebral artery  05/30/2021   Erectile dysfunction 05/18/2009   Essential hypertension 06/16/2009   Expressive aphasia 06/25/2021   External hemorrhoids 03/21/2010   Gout 04/09/2015   Heart murmur    Hemochromatosis 02/14/2011   History of aortic valve replacement 10/13/2009   History of CABG 11/05/2018  2005- 1. Coronary artery bypass grafting x4 with LIMA-LAD, SVG-OM1, SVG-AM and dCFX.  2. Aortic valve replacement with [employer] AVR 3. Septal myomectomy.  4. Myoview low risk 2016   History of hepatitis 01/31/1972   Hypertriglyceridemia 06/13/2010   Hypertrophic obstructive cardiomyopathy 05/06/2008   SP septal myomectomy at surgery 2005 Echo Nov 2018- Hyperdynamic LVEF with severe basal septal hypertrophy. There is chordal SAM with resting gradient of 16 mmHg that increases to 85 mmHg with Valsalva        Leg weakness, bilateral 01/18/2021   Long term  (current) use of anticoagulants 05/22/2018   Lumbar stenosis with neurogenic claudication 02/04/2021   Malignant neoplasm of prostate 06/17/2009   Mechanical heart valve present    Manufacturer: [employer] Duty #: [id number]  Model #: [id number]. Card states MRI compatible with 3 teslas or less.   Obesity, unspecified 04/29/2008   OSA (obstructive sleep apnea) 07/12/2012   moderate-severe; uses CPAP nightly   PONV (postoperative nausea and vomiting)    after CABG- slow to wake up   Presence of prosthetic heart valve    Primary osteoarthritis of left shoulder 10/31/2018   S/P lumbar laminectomy 02/04/2021   Stage 3 chronic kidney disease due to type 2 diabetes mellitus 11/11/2018   Tobacco use 06/16/2009   Type 2 diabetes mellitus with hyperglycemia    Ventral hernia 03/03/2014   Past Surgical History:  Past Surgical History:  Procedure Laterality Date   AORTIC VALVE REPLACEMENT  11/14/2003   [facility]   APPENDECTOMY  1990   CHOLECYSTECTOMY  1990   CORONARY ANGIOGRAPHY  10/11/2021   CORONARY ARTERY BYPASS GRAFT  10/2003   HERNIA REPAIR  1999   right, inguinal   HERNIA REPAIR  2002   left, inguinal   HIP SURGERY  2006   right hip   IR CT HEAD LTD  02/03/2022   IR PERCUTANEOUS ART THROMBECTOMY/INFUSION INTRACRANIAL INC DIAG ANGIO  01/24/2022   LUMBAR LAMINECTOMY/DECOMPRESSION MICRODISCECTOMY Bilateral 02/04/2021   Procedure: Bilateral Lumbar Two-Three Laminectomy;  Surgeon: [name], MD;  Location: [facility] OR;  Service: Neurosurgery;  Laterality: Bilateral;  3C/RM 20   PILONIDAL CYST EXCISION  1964   prostate seed implant  03/2010   RADIOLOGY WITH ANESTHESIA N/A 01/24/2022   Procedure: IR WITH ANESTHESIA;  Surgeon: Radiologist, Medication, MD;  Location: [facility] OR;  Service: Radiology;  Laterality: N/A;   TEE WITHOUT CARDIOVERSION N/A 03/14/2022   Procedure: TRANSESOPHAGEAL ECHOCARDIOGRAM (TEE);  Surgeon: Parke Poisson, MD;  Location: [facility] ENDOSCOPY;  Service: Cardiology;   Laterality: N/A;   TONSILLECTOMY  childhood   Social History:  reports that he has been smoking cigars. He has never used smokeless tobacco. He reports that he does not currently use alcohol. He reports that he does not use drugs. Family History:  Family History  Problem Relation Age of Onset   Heart disease Mother    Stroke Mother    Heart disease Father    Asperger's syndrome Son    Hyperlipidemia Son    Coronary artery disease Brother    Cancer Neg Hx        negative for colon cancer     HOME MEDICATIONS: Allergies as of 06/29/2022   No Known Allergies      Medication List        Accurate as of Jun 29, 2022  9:58 AM. If you have any questions, ask your nurse or doctor.          AMBULATORY  NON FORMULARY MEDICATION cpap cushions            AirFit F20 (Size: Large)           AirFit F30 (Size: Med)   Please FAX the prescription to:  [phone]   amLODipine 5 MG tablet Commonly known as: NORVASC TAKE 1 TABLET (5 MG TOTAL) BY MOUTH DAILY.   aspirin EC 81 MG tablet Take 1 tablet (81 mg total) by mouth daily. Swallow whole.   Basaglar KwikPen 100 UNIT/ML Inject 22 Units into the skin daily. What changed: when to take this   betamethasone dipropionate 0.05 % cream Apply topically 2 (two) times daily as needed. To eczema rash What changed:  how much to take reasons to take this additional instructions   Blood Glucose Monitoring Suppl Supplies Misc Use for monitoring glucose level   clopidogrel 75 MG tablet Commonly known as: PLAVIX TAKE 1 TABLET BY MOUTH EVERY DAY   colchicine 0.6 MG tablet TAKE 1 TABLET (0.6 MG TOTAL) BY MOUTH 2 (TWO) TIMES DAILY AS NEEDED.   dapagliflozin propanediol 10 MG Tabs tablet Commonly known as: Farxiga Take 1 tablet (10 mg total) by mouth daily before breakfast.   Dexcom G6 Sensor Misc 1 Device by Does not apply route as directed.   erythromycin ophthalmic ointment 1 Application See admin instructions. Apply to  affected eye(s) as directed for styes   fenofibrate 145 MG tablet Commonly known as: TRICOR TAKE 1 TABLET BY MOUTH EVERY DAY What changed: when to take this   furosemide 20 MG tablet Commonly known as: LASIX TAKE 1 TABLET BY MOUTH EVERY DAY AS NEEDED FOR SWELLING What changed: See the new instructions.   Insulin Pen Needle 32G X 4 MM Misc 1 Device by Does not apply route in the morning, at noon, in the evening, and at bedtime.   isosorbide mononitrate 30 MG 24 hr tablet Commonly known as: IMDUR TAKE 1 TABLET BY MOUTH EVERY DAY   ketoconazole 2 % cream Commonly known as: NIZORAL Apply 1 Application topically daily as needed for irritation.   metoprolol succinate 25 MG 24 hr tablet Commonly known as: TOPROL-XL TAKE 1 TABLET BY MOUTH EVERY DAY   NovoLOG FlexPen 100 UNIT/ML FlexPen Generic drug: insulin aspart Max daily 75 units What changed:  how much to take how to take this when to take this additional instructions   OneTouch Delica Lancets 33G Misc USE AS DIRECTED   OneTouch Verio test strip Generic drug: glucose blood Use as instructed   pantoprazole 40 MG tablet Commonly known as: Protonix Take 1 tablet (40 mg total) by mouth daily.   potassium chloride SA 20 MEQ tablet Commonly known as: KLOR-CON M Take 1 tablet (20 mEq total) by mouth 2 (two) times daily.   PRESCRIPTION MEDICATION CPAP- At bedtime   rosuvastatin 40 MG tablet Commonly known as: CRESTOR TAKE 1 TABLET BY MOUTH EVERY DAY What changed: when to take this   tacrolimus 0.1 % ointment Commonly known as: PROTOPIC Apply 1 application  topically 2 (two) times daily as needed (for eczema).   tamsulosin 0.4 MG Caps capsule Commonly known as: FLOMAX Take 1 capsule (0.4 mg total) by mouth daily.   warfarin 7.5 MG tablet Commonly known as: COUMADIN Take as directed by the anticoagulation clinic. If you are unsure how to take this medication, talk to your nurse or doctor. Original instructions:  3.75 mg on Monday/Friday and 7.5 mg the rest of the days.  OBJECTIVE:   Vital Signs: BP 120/70 (BP Location: Left Arm, Patient Position: Sitting, Cuff Size: Large)   Pulse (!) 57   Ht 5\' 10"  (1.778 m)   Wt 213 lb (96.6 kg)   SpO2 97%   BMI 30.56 kg/m   Wt Readings from Last 3 Encounters:  06/29/22 213 lb (96.6 kg)  05/12/22 217 lb (98.4 kg)  04/27/22 213 lb (96.6 kg)     Exam: General: Pt appears well and is in NAD  Lungs: Clear with good BS bilat   Heart: RRR   Extremities: 1+  pretibial edema.   Neuro: MS is good with appropriate affect, pt is alert and Ox3      DM Foot Exam 06/29/2022  The skin of the feet is intact without sores or ulcerations. The pedal pulses are 1+ on right and 1+ on left. The sensation is decreased  to a screening 5.07, 10 gram monofilament bilaterally    DATA REVIEWED:  Lab Results  Component Value Date   HGBA1C 7.1 (A) 06/29/2022   HGBA1C 7.3 (H) 04/10/2022   HGBA1C 8.2 (H) 01/24/2022     Latest Reference Range & Units 06/26/21 02:12  Sodium 135 - 145 mmol/L 138  Potassium 3.5 - 5.1 mmol/L 3.9  Chloride 98 - 111 mmol/L 106  CO2 22 - 32 mmol/L 23  Glucose 70 - 99 mg/dL 161 (H)  BUN 8 - 23 mg/dL 25 (H)  Creatinine 0.96 - 1.24 mg/dL 0.45 (H)  Calcium 8.9 - 10.3 mg/dL 9.2  Anion gap 5 - 15  9  GFR, Estimated >60 mL/min 48 (L)      ASSESSMENT / PLAN / RECOMMENDATIONS:   1) Type 2 Diabetes Mellitus, Sub-Optimally  controlled, With CKD III , Neuropathic and Macrovascular complications - Most recent A1c of 7.1 %. Goal A1c < 7.5%.   -A1c has trended down -Patient has had multiple CVAs, he does admit to forgetting to take prandial insulin at times -He used to be on Trulicity but it was cost prohibitive, I have recommended restarting GLP-1 agonist due to cardiovascular benefits -I will start him on Ozempic, his son is on Ozempic so he is familiar with it -Will decrease basal and insulin, preemptively to prevent  hypoglycemia  MEDICATIONS: -Start Ozempic 0.25 mg once weekly for 6 weeks then increase to 0.5 mg weekly -Continue  Farxiga 10 mg , 1 tablet every morning  -Decrease Basaglar 20 units daily  - Continue  Novolog 16 units with Breakfast, 14 units with lunch and 16 units with Supper - Continue Correction Scale : Humalog (BG -130/25)     EDUCATION / INSTRUCTIONS: BG monitoring instructions: Patient is instructed to check his blood sugars 3 times a day, before each meal. Call  Endocrinology clinic if: BG persistently < 70 I reviewed the Rule of 15 for the treatment of hypoglycemia in detail with the patient. Literature supplied.   2) Diabetic complications:  Eye: Does not have known diabetic retinopathy. Neuro/ Feet: Does have known diabetic peripheral neuropathy. Renal: Patient does have known baseline CKD. He is on an ACEI/ARB at present. He sees Dr. Signe Colt    F/U in 6 months    Signed electronically by: Lyndle Herrlich, MD  [facility] Endocrin[facility]  [facility] [address] [place] [place], [place] [address] Phone: [phone] FAX: [phone]   CC: Sandford Craze, NP [address] [place] [address] Phone: [phone]  Fax: [phone]  Return to Endocrinology clinic as  below: Future Appointments  Date Time Provider Department Center  07/07/2022 11:45 AM [facility] None  07/26/2022 12:00 PM Lewayne Bunting, MD [facility] None  08/11/2022 10:20 AM Sandford Craze, NP [facility]  09/27/2022 11:30 AM Marcos Eke, PA-C [facility] None

## 2022-07-03 DIAGNOSIS — H90A21 Sensorineural hearing loss, unilateral, right ear, with restricted hearing on the contralateral side: Secondary | ICD-10-CM | POA: Diagnosis not present

## 2022-07-07 ENCOUNTER — Ambulatory Visit: Payer: Medicare HMO | Attending: Internal Medicine

## 2022-07-07 ENCOUNTER — Other Ambulatory Visit: Payer: Self-pay | Admitting: Family

## 2022-07-07 DIAGNOSIS — Z7901 Long term (current) use of anticoagulants: Secondary | ICD-10-CM

## 2022-07-07 DIAGNOSIS — Z952 Presence of prosthetic heart valve: Secondary | ICD-10-CM | POA: Diagnosis not present

## 2022-07-07 LAB — POCT INR: INR: 2.9 (ref 2.0–3.0)

## 2022-07-07 NOTE — Patient Instructions (Signed)
TAKE 1 TABLET TONIGHT ONLY THEN Continue taking then dose you have been taking, which is, 1 tablet daily except 1/2 tablet on Mondays, Wednesdays, and Fridays.  Continue consistency with leafy green intake 3 servings/week.  Recheck INR in 2 weeks. Coumadin Clinic [phone].

## 2022-07-11 ENCOUNTER — Encounter: Payer: Self-pay | Admitting: Family

## 2022-07-13 ENCOUNTER — Other Ambulatory Visit: Payer: Self-pay | Admitting: Family

## 2022-07-14 ENCOUNTER — Ambulatory Visit: Payer: Medicare HMO | Admitting: Family

## 2022-07-19 DIAGNOSIS — E119 Type 2 diabetes mellitus without complications: Secondary | ICD-10-CM | POA: Diagnosis not present

## 2022-07-19 NOTE — Progress Notes (Signed)
HPI: Followup of coronary artery disease. Patient is status post coronary artery bypassing graft (LIMA to the LAD, saphenous vein graft to the marginal, saphenous vein graft sequentially to the right coronary artery and distal circumflex) as well as aortic valve replacement (St. Jude aortic valve) in 2005. He also had a septal myectomy at that time. Abdominal CT April 2015 showed no aneurysm. Had CVA May 2023. Patient had non-ST elevation myocardial infarction September 2023 in [place].  Patient presented with atrial fibrillation at that time.  Cardiac catheterization revealed severe three-vessel coronary artery disease; patent LIMA to the LAD with 80% stenosis in the very distal LAD; occluded saphenous vein graft to the left circumflex/right coronary artery and occluded saphenous vein graft to the OM; treated medically. CTA October 2023 showed 4.3 cm ascending aortic aneurysm, 7 mm right lower lobe pulmonary nodule with follow-up recommended 6 to 12 months.  Patient had CVA December 2023.  CTA showed occlusion of left M1.  He had mechanical thrombectomy. Transesophageal echocardiogram February 2024 showed normal LV function, mild left atrial enlargement, mild right atrial enlargement, mild mitral regurgitation, status post aortic valve replacement with trace aortic insufficiency, mean gradient 10.4 mmHg, dilated ascending aorta at 45 mm, moderate aortic plaque.  Had recurrent CVA March 2024.  INR was noted to be 2.2.  Brain MRI showed tiny cortical infarct of the left frontal operculum.  Carotid Dopplers March 2024 showed 1 to 39% bilateral stenosis.  Echocardiogram March 2024 showed ejection fraction 60 to 65%, trace mitral regurgitation status post AVR with gradients not measured.  Goal INR was increased to 3-3.5.  Since last seen, patient denies dyspnea, chest pain, palpitations or syncope.  He has had increased pedal edema.  His Lasix was increased Monday.  Current Outpatient Medications   Medication Sig Dispense Refill   AMBULATORY NON FORMULARY MEDICATION cpap cushions            AirFit F20 (Size: Large)           AirFit F30 (Size: Med)   Please FAX the prescription to:  [phone] 12 each prn   amLODipine (NORVASC) 5 MG tablet TAKE 1 TABLET (5 MG TOTAL) BY MOUTH DAILY. 90 tablet 1   aspirin EC 81 MG tablet Take 1 tablet (81 mg total) by mouth daily. Swallow whole. 30 tablet 12   betamethasone dipropionate (DIPROLENE) 0.05 % cream Apply topically 2 (two) times daily as needed. To eczema rash (Patient taking differently: Apply 1 application  topically 2 (two) times daily as needed (to eczema/rash).) 30 g 1   Blood Glucose Monitoring Suppl Supplies MISC Use for monitoring glucose level 100 each 1   colchicine 0.6 MG tablet TAKE 1 TABLET (0.6 MG TOTAL) BY MOUTH 2 (TWO) TIMES DAILY AS NEEDED. 180 tablet 0   Continuous Blood Gluc Sensor (DEXCOM G6 SENSOR) MISC 1 Device by Does not apply route as directed. 9 each 3   dapagliflozin propanediol (FARXIGA) 10 MG TABS tablet Take 1 tablet (10 mg total) by mouth daily before breakfast. 90 tablet 3   erythromycin ophthalmic ointment 1 Application See admin instructions. Apply to affected eye(s) as directed for styes     fenofibrate (TRICOR) 145 MG tablet TAKE 1 TABLET BY MOUTH EVERY DAY 90 tablet 1   furosemide (LASIX) 20 MG tablet TAKE 1 TABLET BY MOUTH EVERY DAY AS NEEDED FOR SWELLING 90 tablet 1   glucose blood (ONETOUCH VERIO) test strip Use as instructed 100 strip 12   insulin aspart (NOVOLOG  FLEXPEN) 100 UNIT/ML FlexPen Max daily 75 units (Patient taking differently: Inject 14-19 Units into the skin See admin instructions. Inject 16 units into the skin with breakfast, 14 units with lunch, and 16 units with supper/evening meal- may increase to up to 19 units per dose as needed for high blood sugar) 75 mL 3   Insulin Glargine (BASAGLAR KWIKPEN) 100 UNIT/ML Inject 22 Units into the skin daily. (Patient taking differently: Inject 22  Units into the skin at bedtime.) 30 mL 3   Insulin Pen Needle 32G X 4 MM MISC 1 Device by Does not apply route in the morning, at noon, in the evening, and at bedtime. 400 each 3   isosorbide mononitrate (IMDUR) 30 MG 24 hr tablet TAKE 1 TABLET BY MOUTH EVERY DAY 90 tablet 2   ketoconazole (NIZORAL) 2 % cream Apply 1 Application topically daily as needed for irritation.     metoprolol succinate (TOPROL-XL) 25 MG 24 hr tablet TAKE 1 TABLET BY MOUTH EVERY DAY 90 tablet 1   OneTouch Delica Lancets 33G MISC USE AS DIRECTED 100 each 1   PRESCRIPTION MEDICATION CPAP- At bedtime     rosuvastatin (CRESTOR) 40 MG tablet TAKE 1 TABLET BY MOUTH EVERY DAY (Patient taking differently: Take 40 mg by mouth at bedtime.) 90 tablet 3   Semaglutide,0.25 or 0.5MG /DOS, 2 MG/3ML SOPN Inject 0.5 mg into the skin once a week. 9 mL 3   tacrolimus (PROTOPIC) 0.1 % ointment Apply 1 application  topically 2 (two) times daily as needed (for eczema).     tamsulosin (FLOMAX) 0.4 MG CAPS capsule Take 1 capsule (0.4 mg total) by mouth daily. 30 capsule 0   warfarin (COUMADIN) 7.5 MG tablet 3.75 mg on Monday/Friday and 7.5 mg the rest of the days. 105 tablet 1   clopidogrel (PLAVIX) 75 MG tablet TAKE 1 TABLET BY MOUTH EVERY DAY (Patient not taking: Reported on 07/26/2022) 90 tablet 1   pantoprazole (PROTONIX) 40 MG tablet Take 1 tablet (40 mg total) by mouth daily. (Patient not taking: Reported on 07/26/2022) 30 tablet 1   potassium chloride SA (KLOR-CON M) 20 MEQ tablet Take 1 tablet (20 mEq total) by mouth 2 (two) times daily. (Patient not taking: Reported on 07/26/2022) 2 tablet 0   No current facility-administered medications for this visit.     Past Medical History:  Diagnosis Date   Actinic keratosis 02/14/2011   Allergy    Aneurysm of ascending aorta without rupture (HCC) 01/06/2022   Aortic stenosis    Arthritis    Bilateral sensorineural hearing loss 06/16/2009   CAD (coronary artery disease)    cabg    Complication of anesthesia    Coronary atherosclerosis of native coronary artery 06/15/2009   Diabetic polyneuropathy 11/11/2018   Dyslipidemia 11/05/2018   Embolic stroke involving left middle cerebral artery  05/30/2021   Erectile dysfunction 05/18/2009   Essential hypertension 06/16/2009   Expressive aphasia 06/25/2021   External hemorrhoids 03/21/2010   Gout 04/09/2015   Heart murmur    Hemochromatosis 02/14/2011   History of aortic valve replacement 10/13/2009   History of CABG 11/05/2018   2005- 1. Coronary artery bypass grafting x4 with LIMA-LAD, SVG-OM1, SVG-AM and dCFX.  2. Aortic valve replacement with St Jude AVR 3. Septal myomectomy.  4. Myoview low risk 2016   History of hepatitis 01/31/1972   Hypertriglyceridemia 06/13/2010   Hypertrophic obstructive cardiomyopathy 05/06/2008   SP septal myomectomy at surgery 2005 Echo Nov 2018- Hyperdynamic LVEF with severe basal septal  hypertrophy. There is chordal SAM with resting gradient of 16 mmHg that increases to 85 mmHg with Valsalva        Leg weakness, bilateral 01/18/2021   Long term (current) use of anticoagulants 05/22/2018   Lumbar stenosis with neurogenic claudication 02/04/2021   Malignant neoplasm of prostate 06/17/2009   Mechanical heart valve present    Manufacturer: Theresa Duty #: [id number]  Model #: [id number]. Card states MRI compatible with 3 teslas or less.   Obesity, unspecified 04/29/2008   OSA (obstructive sleep apnea) 07/12/2012   moderate-severe; uses CPAP nightly   PONV (postoperative nausea and vomiting)    after CABG- slow to wake up   Presence of prosthetic heart valve    Primary osteoarthritis of left shoulder 10/31/2018   S/P lumbar laminectomy 02/04/2021   Stage 3 chronic kidney disease due to type 2 diabetes mellitus 11/11/2018   Tobacco use 06/16/2009   Type 2 diabetes mellitus with hyperglycemia    Ventral hernia 03/03/2014    Past Surgical History:  Procedure Laterality Date   AORTIC  VALVE REPLACEMENT  11/14/2003   [employer] [facility]   APPENDECTOMY  1990   CHOLECYSTECTOMY  1990   CORONARY ANGIOGRAPHY  10/11/2021   CORONARY ARTERY BYPASS GRAFT  10/2003   HERNIA REPAIR  1999   right, inguinal   HERNIA REPAIR  2002   left, inguinal   HIP SURGERY  2006   right hip   IR CT HEAD LTD  02/03/2022   IR PERCUTANEOUS ART THROMBECTOMY/INFUSION INTRACRANIAL INC DIAG ANGIO  01/24/2022   LUMBAR LAMINECTOMY/DECOMPRESSION MICRODISCECTOMY Bilateral 02/04/2021   Procedure: Bilateral Lumbar Two-Three Laminectomy;  Surgeon: [name], MD;  Location: [facility] OR;  Service: Neurosurgery;  Laterality: Bilateral;  3C/RM 20   PILONIDAL CYST EXCISION  1964   prostate seed implant  03/2010   RADIOLOGY WITH ANESTHESIA N/A 01/24/2022   Procedure: IR WITH ANESTHESIA;  Surgeon: Radiologist, Medication, MD;  Location: [facility] OR;  Service: Radiology;  Laterality: N/A;   TEE WITHOUT CARDIOVERSION N/A 03/14/2022   Procedure: TRANSESOPHAGEAL ECHOCARDIOGRAM (TEE);  Surgeon: Parke Poisson, MD;  Location: [facility] ENDOSCOPY;  Service: Cardiology;  Laterality: N/A;   TONSILLECTOMY  childhood    Social History   Socioeconomic History   Marital status: Married    Spouse name: Not on file   Number of children: Not on file   Years of education: 91   Highest education level: Master's degree (e.g., MA, MS, MEng, MEd, MSW, MBA)  Occupational History   Occupation: Retired  Tobacco Use   Smoking status: Some Days    Types: Cigars    Last attempt to quit: 06/2021    Years since quitting: 1.0   Smokeless tobacco: Never   Tobacco comments:    Pt states he smokes 1 cigar about once a month. 06/01/2021    Pt quit  Cigars 06/2021  Substance and Sexual Activity   Alcohol use: Not Currently    Comment: wine maybe 1 per month   Drug use: No   Sexual activity: Never  Other Topics Concern   Not on file  Social History Narrative   ** Merged History Encounter ** Right Handed    Lives in a one sotyr home. One step leads  to front     Lives with wife   Retired   Occasionally caffeine   Social Determinants of Health   Financial Resource Strain: Low Risk  (04/20/2022)   Overall Financial Resource Strain (CARDIA)  Difficulty of Paying Living Expenses: Not hard at all  Food Insecurity: No Food Insecurity (04/20/2022)   Hunger Vital Sign    Worried About Running Out of Food in the Last Year: Never true    Ran Out of Food in the Last Year: Never true  Transportation Needs: Unmet Transportation Needs (04/20/2022)   PRAPARE - Transportation    Lack of Transportation (Medical): Yes    Lack of Transportation (Non-Medical): Yes  Physical Activity: Unknown (04/20/2022)   Exercise Vital Sign    Days of Exercise per Week: 0 days    Minutes of Exercise per Session: Not on file  Stress: No Stress Concern Present (04/20/2022)   [employer] of Occupational Health - Occupational Stress Questionnaire    Feeling of Stress : Not at all  Social Connections: Moderately Integrated (04/20/2022)   Social Connection and Isolation Panel [NHANES]    Frequency of Communication with Friends and Family: Once a week    Frequency of Social Gatherings with Friends and Family: Once a week    Attends Religious Services: More than 4 times per year    Active Member of [employer] or Organizations: Yes    Attends Banker Meetings: 1 to 4 times per year    Marital Status: Married  Catering manager Violence: Not on file    Family History  Problem Relation Age of Onset   Heart disease Mother    Stroke Mother    Heart disease Father    Asperger's syndrome Son    Hyperlipidemia Son    Coronary artery disease Brother    Cancer Neg Hx        negative for colon cancer    ROS: no fevers or chills, productive cough, hemoptysis, dysphasia, odynophagia, melena, hematochezia, dysuria, hematuria, rash, seizure activity, orthopnea, PND, pedal edema, claudication. Remaining systems are negative.  Physical Exam: Well-developed  well-nourished in no acute distress.  Skin is warm and dry.  HEENT is normal.  Neck is supple.  Chest is clear to auscultation with normal expansion.  Cardiovascular exam is regular rate and rhythm.  Crisp mechanical valve sound.  2/6 systolic murmur, no diastolic murmur. Abdominal exam nontender or distended. No masses palpated. Extremities show 1+ edema. neuro grossly intact  A/P  1 recurrent CVAs-previous transesophageal echocardiogram showed normally functioning aortic valve.  Our goal INR now will be 3-3.5.  Continue aspirin as well.  2 status post aortic valve replacement-plan for anticoagulation as outlined above.  Continue SBE prophylaxis.  3 thoracic aortic aneurysm-patient will need follow-up CTA October 2024.  4 lung nodule-follow-up CT October 2024.  5 paroxysmal atrial fibrillation-patient is in sinus rhythm on exam.  Continue beta-blocker and Coumadin at present dose.  If he has more frequent episodes in the future we will consider antiarrhythmic.  6 hypertension-blood pressure controlled.  However he does note some increased lower extremity edema.  Question contribution from amlodipine.  Will discontinue.  Follow blood pressure and advance other medications if needed.  7 hyperlipidemia-continue statin.  8 history of hypertrophic cardiomyopathy-status post myectomy; continue beta-blocker.  9 coronary artery disease-patient denies chest pain.  Continue statin.  Previous outpatient cardiac catheterization films from [place] were reviewed with Dr. Tresa Endo and medical therapy felt best option.  Previous infarct felt to be secondary to atrial fibrillation with rapid ventricular spots superimposed on underlying coronary disease.  10 pedal edema-patient has noticed increased pedal edema.  His Lasix was increased to 40 mg daily and we will continue.  Check potassium and  renal function in 1 week.   Olga Millers, MD

## 2022-07-21 ENCOUNTER — Ambulatory Visit: Payer: Medicare HMO | Attending: Cardiology

## 2022-07-21 DIAGNOSIS — Z7901 Long term (current) use of anticoagulants: Secondary | ICD-10-CM

## 2022-07-21 DIAGNOSIS — Z952 Presence of prosthetic heart valve: Secondary | ICD-10-CM | POA: Diagnosis not present

## 2022-07-21 LAB — POCT INR: INR: 3.8 — AB (ref 2.0–3.0)

## 2022-07-21 NOTE — Patient Instructions (Signed)
Continue taking 1 tablet daily except 1/2 tablet on Mondays, Wednesdays, and Fridays.  Continue consistency with leafy green intake 3 servings/week.  Recheck INR in 3 weeks. [facility] [phone].

## 2022-07-23 DIAGNOSIS — I10 Essential (primary) hypertension: Secondary | ICD-10-CM | POA: Diagnosis not present

## 2022-07-23 DIAGNOSIS — G4733 Obstructive sleep apnea (adult) (pediatric): Secondary | ICD-10-CM | POA: Diagnosis not present

## 2022-07-25 ENCOUNTER — Telehealth: Payer: Self-pay

## 2022-07-25 NOTE — Telephone Encounter (Signed)
Patient aware that Ozempic patient assistance is ready for pick up

## 2022-07-26 ENCOUNTER — Encounter: Payer: Self-pay | Admitting: Cardiology

## 2022-07-26 ENCOUNTER — Ambulatory Visit: Payer: Medicare HMO | Attending: Cardiology | Admitting: Cardiology

## 2022-07-26 VITALS — BP 120/62 | HR 60 | Ht 70.0 in | Wt 214.8 lb

## 2022-07-26 DIAGNOSIS — Z952 Presence of prosthetic heart valve: Secondary | ICD-10-CM

## 2022-07-26 DIAGNOSIS — I7121 Aneurysm of the ascending aorta, without rupture: Secondary | ICD-10-CM | POA: Diagnosis not present

## 2022-07-26 DIAGNOSIS — E785 Hyperlipidemia, unspecified: Secondary | ICD-10-CM

## 2022-07-26 DIAGNOSIS — I480 Paroxysmal atrial fibrillation: Secondary | ICD-10-CM

## 2022-07-26 DIAGNOSIS — R0602 Shortness of breath: Secondary | ICD-10-CM

## 2022-07-26 DIAGNOSIS — I10 Essential (primary) hypertension: Secondary | ICD-10-CM

## 2022-07-26 DIAGNOSIS — I25810 Atherosclerosis of coronary artery bypass graft(s) without angina pectoris: Secondary | ICD-10-CM

## 2022-07-26 NOTE — Patient Instructions (Addendum)
Stop amlodipine   Lab Work:  Your physician recommends that you return for lab work in one week-do not need to fast  [employer] on the 3 rd floor in [address] Hours-Monday - Friday 8 am-11:30 am and 1 pm -4 pm   If you have labs (blood work) drawn today and your tests are completely normal, you will receive your results only by: MyChart Message (if you have MyChart) OR A paper copy in the mail If you have any lab test that is abnormal or we need to change your treatment, we will call you to review the results.    Follow-Up: At [facility], you and your health needs are our priority.  As part of our continuing mission to provide you with exceptional heart care, we have created designated Provider Care Teams.  These Care Teams include your primary Cardiologist (physician) and Advanced Practice Providers (APPs -  Physician Assistants and Nurse Practitioners) who all work together to provide you with the care you need, when you need it.  We recommend signing up for the patient portal called "MyChart".  Sign up information is provided on this After Visit Summary.  MyChart is used to connect with patients for Virtual Visits (Telemedicine).  Patients are able to view lab/test results, encounter notes, upcoming appointments, etc.  Non-urgent messages can be sent to your provider as well.   To learn more about what you can do with MyChart, go to [email or web address].    Your next appointment:   3 month(s)  Provider:   Olga Millers, MD

## 2022-07-27 DIAGNOSIS — R300 Dysuria: Secondary | ICD-10-CM | POA: Diagnosis not present

## 2022-07-27 NOTE — Telephone Encounter (Signed)
4 boxes Ozempic patient assistance picked up - log noted

## 2022-07-31 ENCOUNTER — Encounter: Payer: Self-pay | Admitting: Family

## 2022-07-31 DIAGNOSIS — N400 Enlarged prostate without lower urinary tract symptoms: Secondary | ICD-10-CM | POA: Diagnosis not present

## 2022-07-31 DIAGNOSIS — Z8673 Personal history of transient ischemic attack (TIA), and cerebral infarction without residual deficits: Secondary | ICD-10-CM

## 2022-07-31 DIAGNOSIS — R339 Retention of urine, unspecified: Secondary | ICD-10-CM | POA: Diagnosis not present

## 2022-08-02 ENCOUNTER — Ambulatory Visit (INDEPENDENT_AMBULATORY_CARE_PROVIDER_SITE_OTHER): Payer: Medicare HMO | Admitting: *Deleted

## 2022-08-02 DIAGNOSIS — R0602 Shortness of breath: Secondary | ICD-10-CM | POA: Diagnosis not present

## 2022-08-02 DIAGNOSIS — Z0000 Encounter for general adult medical examination without abnormal findings: Secondary | ICD-10-CM | POA: Diagnosis not present

## 2022-08-02 NOTE — Progress Notes (Signed)
Subjective:   [name] is a 77 y.o. male who presents for Medicare Annual/Subsequent preventive examination.  Visit Complete: Virtual  I connected with  [name] on 08/02/22 by a audio enabled telemedicine application and verified that I am speaking with the correct person using two identifiers.  Patient Location: Home  Provider Location: Office/Clinic  I discussed the limitations of evaluation and management by telemedicine. The patient expressed understanding and agreed to proceed.  Patient Medicare AWV questionnaire was completed by the patient on 08/01/22; I have confirmed that all information answered by patient is correct and no changes since this date.  Review of Systems     Cardiac Risk Factors include: advanced age (>34men, >20 women);male gender;diabetes mellitus;hypertension;dyslipidemia     Objective:    Today's Vitals   There is no height or weight on file to calculate BMI.     08/02/2022    3:37 PM 04/27/2022   10:05 AM 04/27/2022    3:07 AM 04/09/2022    6:15 AM 03/21/2022   11:05 AM 03/14/2022    7:47 AM 02/21/2022    4:03 PM  Advanced Directives  Does Patient Have a Medical Advance Directive? Yes Yes Yes No Yes Yes No  Type of Estate agent of [place];Living will Healthcare Power of [place];Living will Healthcare Power of [place];Living will   Healthcare Power of [place];Living will   Copy of Healthcare Power of Attorney in Chart? Yes - validated most recent copy scanned in chart (See row information)     Yes - validated most recent copy scanned in chart (See row information)   Would patient like information on creating a medical advance directive?       No - Patient declined    Current Medications (verified) Outpatient Encounter Medications as of 08/02/2022  Medication Sig   AMBULATORY NON FORMULARY MEDICATION cpap cushions            AirFit F20 (Size: Large)           AirFit F30 (Size: Med)   Please FAX the  prescription to:  [phone]   aspirin EC 81 MG tablet Take 1 tablet (81 mg total) by mouth daily. Swallow whole.   betamethasone dipropionate (DIPROLENE) 0.05 % cream Apply topically 2 (two) times daily as needed. To eczema rash (Patient taking differently: Apply 1 application  topically 2 (two) times daily as needed (to eczema/rash).)   Blood Glucose Monitoring Suppl Supplies MISC Use for monitoring glucose level   clopidogrel (PLAVIX) 75 MG tablet TAKE 1 TABLET BY MOUTH EVERY DAY (Patient not taking: Reported on 07/26/2022)   colchicine 0.6 MG tablet TAKE 1 TABLET (0.6 MG TOTAL) BY MOUTH 2 (TWO) TIMES DAILY AS NEEDED.   Continuous Blood Gluc Sensor (DEXCOM G6 SENSOR) MISC 1 Device by Does not apply route as directed.   dapagliflozin propanediol (FARXIGA) 10 MG TABS tablet Take 1 tablet (10 mg total) by mouth daily before breakfast.   erythromycin ophthalmic ointment 1 Application See admin instructions. Apply to affected eye(s) as directed for styes   fenofibrate (TRICOR) 145 MG tablet TAKE 1 TABLET BY MOUTH EVERY DAY   furosemide (LASIX) 20 MG tablet TAKE 1 TABLET BY MOUTH EVERY DAY AS NEEDED FOR SWELLING   glucose blood (ONETOUCH VERIO) test strip Use as instructed   insulin aspart (NOVOLOG FLEXPEN) 100 UNIT/ML FlexPen Max daily 75 units (Patient taking differently: Inject 14-19 Units into the skin See admin instructions. Inject 16 units into the skin with breakfast,  14 units with lunch, and 16 units with supper/evening meal- may increase to up to 19 units per dose as needed for high blood sugar)   Insulin Glargine (BASAGLAR KWIKPEN) 100 UNIT/ML Inject 22 Units into the skin daily. (Patient taking differently: Inject 22 Units into the skin at bedtime.)   Insulin Pen Needle 32G X 4 MM MISC 1 Device by Does not apply route in the morning, at noon, in the evening, and at bedtime.   isosorbide mononitrate (IMDUR) 30 MG 24 hr tablet TAKE 1 TABLET BY MOUTH EVERY DAY   ketoconazole (NIZORAL) 2 %  cream Apply 1 Application topically daily as needed for irritation.   metoprolol succinate (TOPROL-XL) 25 MG 24 hr tablet TAKE 1 TABLET BY MOUTH EVERY DAY   OneTouch Delica Lancets 33G MISC USE AS DIRECTED   pantoprazole (PROTONIX) 40 MG tablet Take 1 tablet (40 mg total) by mouth daily. (Patient not taking: Reported on 07/26/2022)   potassium chloride SA (KLOR-CON M) 20 MEQ tablet Take 1 tablet (20 mEq total) by mouth 2 (two) times daily. (Patient not taking: Reported on 07/26/2022)   PRESCRIPTION MEDICATION CPAP- At bedtime   rosuvastatin (CRESTOR) 40 MG tablet TAKE 1 TABLET BY MOUTH EVERY DAY (Patient taking differently: Take 40 mg by mouth at bedtime.)   Semaglutide,0.25 or 0.5MG /DOS, 2 MG/3ML SOPN Inject 0.5 mg into the skin once a week.   tacrolimus (PROTOPIC) 0.1 % ointment Apply 1 application  topically 2 (two) times daily as needed (for eczema).   tamsulosin (FLOMAX) 0.4 MG CAPS capsule Take 1 capsule (0.4 mg total) by mouth daily.   warfarin (COUMADIN) 7.5 MG tablet 3.75 mg on Monday/Friday and 7.5 mg the rest of the days.   No facility-administered encounter medications on file as of 08/02/2022.    Allergies (verified) Patient has no known allergies.   History: Past Medical History:  Diagnosis Date   Actinic keratosis 02/14/2011   Allergy    Aneurysm of ascending aorta without rupture (HCC) 01/06/2022   Aortic stenosis    Arthritis    Bilateral sensorineural hearing loss 06/16/2009   CAD (coronary artery disease)    cabg   Complication of anesthesia    Coronary atherosclerosis of native coronary artery 06/15/2009   Diabetic polyneuropathy 11/11/2018   Dyslipidemia 11/05/2018   Embolic stroke involving left middle cerebral artery  05/30/2021   Erectile dysfunction 05/18/2009   Essential hypertension 06/16/2009   Expressive aphasia 06/25/2021   External hemorrhoids 03/21/2010   Gout 04/09/2015   Heart murmur    Hemochromatosis 02/14/2011   History of aortic valve  replacement 10/13/2009   History of CABG 11/05/2018   2005- 1. Coronary artery bypass grafting x4 with LIMA-LAD, SVG-OM1, SVG-AM and dCFX.  2. Aortic valve replacement with St Jude AVR 3. Septal myomectomy.  4. Myoview low risk 2016   History of hepatitis 01/31/1972   Hypertriglyceridemia 06/13/2010   Hypertrophic obstructive cardiomyopathy 05/06/2008   SP septal myomectomy at surgery 2005 Echo Nov 2018- Hyperdynamic LVEF with severe basal septal hypertrophy. There is chordal SAM with resting gradient of 16 mmHg that increases to 85 mmHg with Valsalva        Leg weakness, bilateral 01/18/2021   Long term (current) use of anticoagulants 05/22/2018   Lumbar stenosis with neurogenic claudication 02/04/2021   Malignant neoplasm of prostate 06/17/2009   Mechanical heart valve present    Manufacturer: [employer] Duty #: [id number]  Model #: [id number]. Card states MRI compatible with 3 teslas or  less.   Obesity, unspecified 04/29/2008   OSA (obstructive sleep apnea) 07/12/2012   moderate-severe; uses CPAP nightly   PONV (postoperative nausea and vomiting)    after CABG- slow to wake up   Presence of prosthetic heart valve    Primary osteoarthritis of left shoulder 10/31/2018   S/P lumbar laminectomy 02/04/2021   Stage 3 chronic kidney disease due to type 2 diabetes mellitus 11/11/2018   Tobacco use 06/16/2009   Type 2 diabetes mellitus with hyperglycemia    Ventral hernia 03/03/2014   Past Surgical History:  Procedure Laterality Date   AORTIC VALVE REPLACEMENT  11/14/2003   [facility]   APPENDECTOMY  1990   CHOLECYSTECTOMY  1990   CORONARY ANGIOGRAPHY  10/11/2021   CORONARY ARTERY BYPASS GRAFT  10/2003   HERNIA REPAIR  1999   right, inguinal   HERNIA REPAIR  2002   left, inguinal   HIP SURGERY  2006   right hip   IR CT HEAD LTD  02/03/2022   IR PERCUTANEOUS ART THROMBECTOMY/INFUSION INTRACRANIAL INC DIAG ANGIO  01/24/2022   LUMBAR LAMINECTOMY/DECOMPRESSION MICRODISCECTOMY  Bilateral 02/04/2021   Procedure: Bilateral Lumbar Two-Three Laminectomy;  Surgeon: [name], MD;  Location: [facility] OR;  Service: Neurosurgery;  Laterality: Bilateral;  3C/RM 20   PILONIDAL CYST EXCISION  1964   prostate seed implant  03/2010   RADIOLOGY WITH ANESTHESIA N/A 01/24/2022   Procedure: IR WITH ANESTHESIA;  Surgeon: Radiologist, Medication, MD;  Location: [facility] OR;  Service: Radiology;  Laterality: N/A;   TEE WITHOUT CARDIOVERSION N/A 03/14/2022   Procedure: TRANSESOPHAGEAL ECHOCARDIOGRAM (TEE);  Surgeon: Parke Poisson, MD;  Location: [facility] ENDOSCOPY;  Service: Cardiology;  Laterality: N/A;   TONSILLECTOMY  childhood   Family History  Problem Relation Age of Onset   Heart disease Mother    Stroke Mother    Heart disease Father    Asperger's syndrome Son    Hyperlipidemia Son    Coronary artery disease Brother    Cancer Neg Hx        negative for colon cancer   Social History   Socioeconomic History   Marital status: Married    Spouse name: Not on file   Number of children: Not on file   Years of education: 18   Highest education level: Master's degree (e.g., MA, MS, MEng, MEd, MSW, MBA)  Occupational History   Occupation: Retired  Tobacco Use   Smoking status: Some Days    Types: Cigars    Last attempt to quit: 06/2021    Years since quitting: 1.0   Smokeless tobacco: Never   Tobacco comments:    Pt states he smokes 1 cigar about once a month. 06/01/2021    Pt quit  Cigars 06/2021  Substance and Sexual Activity   Alcohol use: Not Currently    Comment: wine maybe 1 per month   Drug use: No   Sexual activity: Never  Other Topics Concern   Not on file  Social History Narrative   ** Merged History Encounter ** Right Handed    Lives in a one sotyr home. One step leads to front     Lives with wife   Retired   Occasionally caffeine   Social Determinants of Health   Financial Resource Strain: Low Risk  (08/01/2022)   Overall Financial Resource Strain (CARDIA)     Difficulty of Paying Living Expenses: Not hard at all  Food Insecurity: No Food Insecurity (08/01/2022)   Hunger Vital Sign  Worried About Programme researcher, broadcasting/film/video in the Last Year: Never true    Ran Out of Food in the Last Year: Never true  Transportation Needs: No Transportation Needs (08/01/2022)   PRAPARE - Administrator, Civil Service (Medical): No    Lack of Transportation (Non-Medical): No  Physical Activity: Insufficiently Active (08/01/2022)   Exercise Vital Sign    Days of Exercise per Week: 3 days    Minutes of Exercise per Session: 30 min  Stress: No Stress Concern Present (08/01/2022)   [employer] of Occupational Health - Occupational Stress Questionnaire    Feeling of Stress : Not at all  Social Connections: Moderately Integrated (08/01/2022)   Social Connection and Isolation Panel [NHANES]    Frequency of Communication with Friends and Family: Twice a week    Frequency of Social Gatherings with Friends and Family: Once a week    Attends Religious Services: More than 4 times per year    Active Member of [employer] or Organizations: No    Attends Banker Meetings: Never    Marital Status: Married    Tobacco Counseling Ready to quit: Not Answered Counseling given: Not Answered Tobacco comments: Pt states he smokes 1 cigar about once a month. 06/01/2021 Pt quit  Cigars 06/2021   Clinical Intake:  Pre-visit preparation completed: Yes  Pain : No/denies pain  Nutritional Risks: None Diabetes: Yes CBG done?: No Did pt. bring in CBG monitor from home?: No  How often do you need to have someone help you when you read instructions, pamphlets, or other written materials from your doctor or pharmacy?: 1 - Never  Interpreter Needed?: No  Information entered by :: Donne Anon, CMA   Activities of Daily Living    08/01/2022    4:56 PM 07/26/2022    7:22 PM  In your present state of health, do you have any difficulty performing the following  activities:  Hearing? 1 1  Vision? 0 0  Difficulty concentrating or making decisions? 0 0  Walking or climbing stairs? 1 1  Dressing or bathing? 0 0  Doing errands, shopping? 0 0  Preparing Food and eating ? N N  Using the Toilet? N N  In the past six months, have you accidently leaked urine? Y Y  Do you have problems with loss of bowel control? N N  Managing your Medications? N N  Managing your Finances? N N  Housekeeping or managing your Housekeeping? N N    Patient Care Team: Sandford Craze, NP as PCP - General (Internal Medicine) Jens Som Madolyn Frieze, MD as PCP - Cardiology (Cardiology) Marcine Matar, MD as Consulting Physician (Urology) Nelson Chimes, MD as Consulting Physician (Ophthalmology) Myna Hidalgo Rose Phi, MD as Consulting Physician (Oncology) Jens Som Madolyn Frieze, MD as Consulting Physician (Cardiology) Sandford Craze, NP (Internal Medicine) Colletta [place], RN as [facility] Care Management  Indicate any recent Medical Services you may have received from other than [facility] providers in the past year (date may be approximate).     Assessment:   This is a routine wellness examination for [place].  Hearing/Vision screen No results found.  Dietary issues and exercise activities discussed:     Goals Addressed   None    Depression Screen    08/02/2022    3:58 PM 01/06/2022   11:18 AM 10/19/2021    3:50 PM 07/06/2021    1:03 PM 03/09/2020    9:27 AM 03/06/2018   11:10 AM 01/17/2017  12:56 PM  PHQ 2/9 Scores  PHQ - 2 Score 0 0 0 0 0 0 0  PHQ- 9 Score      1 1    Fall Risk    08/01/2022    4:56 PM 07/26/2022    7:22 PM 03/21/2022   11:05 AM 01/06/2022   11:18 AM 10/19/2021    3:50 PM  Fall Risk   Falls in the past year? 0 0 0 0 0  Number falls in past yr: 0 0 0 0 0  Injury with Fall? 0 0 0 0 0  Risk for fall due to : No Fall Risks      Follow up Falls evaluation completed  Falls evaluation completed Falls evaluation completed Falls  evaluation completed    MEDICARE RISK AT HOME:   TIMED UP AND GO:  Was the test performed?  No    Cognitive Function:    01/19/2016    2:24 PM  MMSE - Mini Mental State Exam  Orientation to time 5  Orientation to Place 5  Registration 3  Attention/ Calculation 5  Recall 2  Language- name 2 objects 2  Language- repeat 1  Language- follow 3 step command 3  Language- read & follow direction 1  Write a sentence 1  Copy design 1  Total score 29      12/28/2020    8:00 AM  Montreal Cognitive Assessment   Visuospatial/ Executive (0/5) 5  Naming (0/3) 3  Attention: Read list of digits (0/2) 2  Attention: Read list of letters (0/1) 0  Attention: Serial 7 subtraction starting at 100 (0/3) 3  Language: Repeat phrase (0/2) 1  Language : Fluency (0/1) 1  Abstraction (0/2) 2  Delayed Recall (0/5) 5  Orientation (0/6) 6  Total 28      08/02/2022    4:00 PM  6CIT Screen  What Year? 0 points  What month? 0 points  What time? 0 points  Count back from 20 0 points  Months in reverse 0 points  Repeat phrase 0 points  Total Score 0 points    Immunizations Immunization History  Administered Date(s) Administered   Influenza Split 11/25/2010, 11/21/2011   Influenza Whole 11/30/2008, 10/13/2009   Influenza, High Dose Seasonal PF 09/18/2016, 10/23/2018, 11/12/2019, 11/29/2020   Influenza,inj,Quad PF,6+ Mos 12/06/2012, 09/30/2013, 10/01/2014, 10/15/2015   Influenza-Unspecified 09/18/2016, 09/30/2017   PFIZER(Purple Top)SARS-COV-2 Vaccination 03/26/2019, 04/15/2019, 11/12/2019   Pfizer Covid-19 Vaccine Bivalent Booster 25yrs & up 11/29/2020   Pfizer Covid-19 Vaccine Bivalent Booster 5y-11y 11/29/2020   Pneumococcal Conjugate-13 02/12/2013   Pneumococcal Polysaccharide-23 05/19/2011   Td 06/01/2000   Tdap 03/01/2012   Zoster, Live 06/13/2010    TDAP status: Due, Education has been provided regarding the importance of this vaccine. Advised may receive this vaccine at local  pharmacy or Health Dept. Aware to provide a copy of the vaccination record if obtained from local pharmacy or Health Dept. Verbalized acceptance and understanding.  Flu Vaccine status: Up to date  Pneumococcal vaccine status: Up to date  Covid-19 vaccine status: Information provided on how to obtain vaccines.   Qualifies for Shingles Vaccine? Yes   Zostavax completed Yes   Shingrix Completed?: No.    Education has been provided regarding the importance of this vaccine. Patient has been advised to call insurance company to determine out of pocket expense if they have not yet received this vaccine. Advised may also receive vaccine at local pharmacy or Health Dept. Verbalized acceptance and understanding.  Screening Tests Health Maintenance  Topic Date Due   Zoster Vaccines- Shingrix (1 of 2) Never done   Medicare Annual Wellness (AWV)  01/18/2017   COVID-19 Vaccine (5 - 2023-24 season) 09/30/2021   DTaP/Tdap/Td (3 - Td or Tdap) 03/01/2022   INFLUENZA VACCINE  08/31/2022   Diabetic kidney evaluation - Urine ACR  10/05/2022   HEMOGLOBIN A1C  12/30/2022   OPHTHALMOLOGY EXAM  04/05/2023   Diabetic kidney evaluation - eGFR measurement  05/12/2023   FOOT EXAM  06/29/2023   Pneumonia Vaccine 56+ Years old  Completed   Hepatitis C Screening  Completed   HPV VACCINES  Aged Out   Colonoscopy  Discontinued    Health Maintenance  Health Maintenance Due  Topic Date Due   Zoster Vaccines- Shingrix (1 of 2) Never done   Medicare Annual Wellness (AWV)  01/18/2017   COVID-19 Vaccine (5 - 2023-24 season) 09/30/2021   DTaP/Tdap/Td (3 - Td or Tdap) 03/01/2022    Colorectal cancer screening: No longer required.   Lung Cancer Screening: (Low Dose CT Chest recommended if Age 38-80 years, 20 pack-year currently smoking OR have quit w/in 15years.) does not qualify.   Additional Screening:  Hepatitis C Screening: does qualify; Completed 11/30/10  Vision Screening: Recommended annual  ophthalmology exams for early detection of glaucoma and other disorders of the eye. Is the patient up to date with their annual eye exam?  Yes  Who is the provider or what is the name of the office in which the patient attends annual eye exams? [facility]. If pt is not established with a provider, would they like to be referred to a provider to establish care? No .   Dental Screening: Recommended annual dental exams for proper oral hygiene  Diabetic Foot Exam: Diabetic Foot Exam: Completed 06/29/22  Community Resource Referral / Chronic Care Management: CRR required this visit?  No   CCM required this visit?  No     Plan:     I have personally reviewed and noted the following in the patient's chart:   Medical and social history Use of alcohol, tobacco or illicit drugs  Current medications and supplements including opioid prescriptions. Patient is not currently taking opioid prescriptions. Functional ability and status Nutritional status Physical activity Advanced directives List of other physicians Hospitalizations, surgeries, and ER visits in previous 12 months Vitals Screenings to include cognitive, depression, and falls Referrals and appointments  In addition, I have reviewed and discussed with patient certain preventive protocols, quality metrics, and best practice recommendations. A written personalized care plan for preventive services as well as general preventive health recommendations were provided to patient.     Donne Anon, CMA   08/02/2022   After Visit Summary: (MyChart) Due to this being a telephonic visit, the after visit summary with patients personalized plan was offered to patient via MyChart   Nurse Notes: None

## 2022-08-02 NOTE — Patient Instructions (Signed)
[name] , Thank you for taking time to come for your Medicare Wellness Visit. I appreciate your ongoing commitment to your health goals. Please review the following plan we discussed and let me know if I can assist you in the future.     This is a list of the screening recommended for you and due dates:  Health Maintenance  Topic Date Due   Zoster (Shingles) Vaccine (1 of 2) Never done   COVID-19 Vaccine (5 - 2023-24 season) 09/30/2021   DTaP/Tdap/Td vaccine (3 - Td or Tdap) 03/01/2022   Flu Shot  08/31/2022   Yearly kidney health urinalysis for diabetes  10/05/2022   Hemoglobin A1C  12/30/2022   Eye exam for diabetics  04/05/2023   Yearly kidney function blood test for diabetes  05/12/2023   Complete foot exam   06/29/2023   Medicare Annual Wellness Visit  08/02/2023   Pneumonia Vaccine  Completed   Hepatitis C Screening  Completed   HPV Vaccine  Aged Out   Colon Cancer Screening  Discontinued    Next appointment: Follow up in one year for your annual wellness visit.   Preventive Care 20 Years and Older, Male Preventive care refers to lifestyle choices and visits with your health care provider that can promote health and wellness. What does preventive care include? A yearly physical exam. This is also called an annual well check. Dental exams once or twice a year. Routine eye exams. Ask your health care provider how often you should have your eyes checked. Personal lifestyle choices, including: Daily care of your teeth and gums. Regular physical activity. Eating a healthy diet. Avoiding tobacco and drug use. Limiting alcohol use. Practicing safe sex. Taking low doses of aspirin every day. Taking vitamin and mineral supplements as recommended by your health care provider. What happens during an annual well check? The services and screenings done by your health care provider during your annual well check will depend on your age, overall health, lifestyle risk factors, and  family history of disease. Counseling  Your health care provider may ask you questions about your: Alcohol use. Tobacco use. Drug use. Emotional well-being. Home and relationship well-being. Sexual activity. Eating habits. History of falls. Memory and ability to understand (cognition). Work and work Astronomer. Screening  You may have the following tests or measurements: Height, weight, and BMI. Blood pressure. Lipid and cholesterol levels. These may be checked every 5 years, or more frequently if you are over 62 years old. Skin check. Lung cancer screening. You may have this screening every year starting at age 45 if you have a 30-pack-year history of smoking and currently smoke or have quit within the past 15 years. Fecal occult blood test (FOBT) of the stool. You may have this test every year starting at age 71. Flexible sigmoidoscopy or colonoscopy. You may have a sigmoidoscopy every 5 years or a colonoscopy every 10 years starting at age 20. Prostate cancer screening. Recommendations will vary depending on your family history and other risks. Hepatitis C blood test. Hepatitis B blood test. Sexually transmitted disease (STD) testing. Diabetes screening. This is done by checking your blood sugar (glucose) after you have not eaten for a while (fasting). You may have this done every 1-3 years. Abdominal aortic aneurysm (AAA) screening. You may need this if you are a current or former smoker. Osteoporosis. You may be screened starting at age 75 if you are at high risk. Talk with your health care provider about your test results,  treatment options, and if necessary, the need for more tests. Vaccines  Your health care provider may recommend certain vaccines, such as: Influenza vaccine. This is recommended every year. Tetanus, diphtheria, and acellular pertussis (Tdap, Td) vaccine. You may need a Td booster every 10 years. Zoster vaccine. You may need this after age 18. Pneumococcal  13-valent conjugate (PCV13) vaccine. One dose is recommended after age 62. Pneumococcal polysaccharide (PPSV23) vaccine. One dose is recommended after age 76. Talk to your health care provider about which screenings and vaccines you need and how often you need them. This information is not intended to replace advice given to you by your health care provider. Make sure you discuss any questions you have with your health care provider. Document Released: 02/12/2015 Document Revised: 10/06/2015 Document Reviewed: 11/17/2014 Elsevier Interactive Patient Education  2017 [place] Prevention in the Home Falls can cause injuries. They can happen to people of all ages. There are many things you can do to make your home safe and to help prevent falls. What can I do on the outside of my home? Regularly fix the edges of walkways and driveways and fix any cracks. Remove anything that might make you trip as you walk through a door, such as a raised step or threshold. Trim any bushes or trees on the path to your home. Use bright outdoor lighting. Clear any walking paths of anything that might make someone trip, such as rocks or tools. Regularly check to see if handrails are loose or broken. Make sure that both sides of any steps have handrails. Any raised decks and porches should have guardrails on the edges. Have any leaves, snow, or ice cleared regularly. Use sand or salt on walking paths during winter. Clean up any spills in your garage right away. This includes oil or grease spills. What can I do in the bathroom? Use night lights. Install grab bars by the toilet and in the tub and shower. Do not use towel bars as grab bars. Use non-skid mats or decals in the tub or shower. If you need to sit down in the shower, use a plastic, non-slip stool. Keep the floor dry. Clean up any water that spills on the floor as soon as it happens. Remove soap buildup in the tub or shower regularly. Attach  bath mats securely with double-sided non-slip rug tape. Do not have throw rugs and other things on the floor that can make you trip. What can I do in the bedroom? Use night lights. Make sure that you have a light by your bed that is easy to reach. Do not use any sheets or blankets that are too big for your bed. They should not hang down onto the floor. Have a firm chair that has side arms. You can use this for support while you get dressed. Do not have throw rugs and other things on the floor that can make you trip. What can I do in the kitchen? Clean up any spills right away. Avoid walking on wet floors. Keep items that you use a lot in easy-to-reach places. If you need to reach something above you, use a strong step stool that has a grab bar. Keep electrical cords out of the way. Do not use floor polish or wax that makes floors slippery. If you must use wax, use non-skid floor wax. Do not have throw rugs and other things on the floor that can make you trip. What can I do with my stairs? Do  not leave any items on the stairs. Make sure that there are handrails on both sides of the stairs and use them. Fix handrails that are broken or loose. Make sure that handrails are as long as the stairways. Check any carpeting to make sure that it is firmly attached to the stairs. Fix any carpet that is loose or worn. Avoid having throw rugs at the top or bottom of the stairs. If you do have throw rugs, attach them to the floor with carpet tape. Make sure that you have a light switch at the top of the stairs and the bottom of the stairs. If you do not have them, ask someone to add them for you. What else can I do to help prevent falls? Wear shoes that: Do not have high heels. Have rubber bottoms. Are comfortable and fit you well. Are closed at the toe. Do not wear sandals. If you use a stepladder: Make sure that it is fully opened. Do not climb a closed stepladder. Make sure that both sides of the  stepladder are locked into place. Ask someone to hold it for you, if possible. Clearly mark and make sure that you can see: Any grab bars or handrails. First and last steps. Where the edge of each step is. Use tools that help you move around (mobility aids) if they are needed. These include: Canes. Walkers. Scooters. Crutches. Turn on the lights when you go into a dark area. Replace any light bulbs as soon as they burn out. Set up your furniture so you have a clear path. Avoid moving your furniture around. If any of your floors are uneven, fix them. If there are any pets around you, be aware of where they are. Review your medicines with your doctor. Some medicines can make you feel dizzy. This can increase your chance of falling. Ask your doctor what other things that you can do to help prevent falls. This information is not intended to replace advice given to you by your health care provider. Make sure you discuss any questions you have with your health care provider. Document Released: 11/12/2008 Document Revised: 06/24/2015 Document Reviewed: 02/20/2014 Elsevier Interactive Patient Education  2017 [employer].

## 2022-08-03 LAB — BASIC METABOLIC PANEL
BUN/Creatinine Ratio: 23 (ref 10–24)
BUN: 34 mg/dL — ABNORMAL HIGH (ref 8–27)
CO2: 25 mmol/L (ref 20–29)
Calcium: 9.4 mg/dL (ref 8.6–10.2)
Chloride: 99 mmol/L (ref 96–106)
Creatinine, Ser: 1.51 mg/dL — ABNORMAL HIGH (ref 0.76–1.27)
Glucose: 89 mg/dL (ref 70–99)
Potassium: 4.6 mmol/L (ref 3.5–5.2)
Sodium: 142 mmol/L (ref 134–144)
eGFR: 47 mL/min/{1.73_m2} — ABNORMAL LOW (ref >59)

## 2022-08-03 LAB — PRO B NATRIURETIC PEPTIDE: NT-Pro BNP: 184 pg/mL (ref 0–486)

## 2022-08-04 ENCOUNTER — Ambulatory Visit: Payer: Medicare HMO | Attending: Cardiovascular Disease

## 2022-08-04 ENCOUNTER — Encounter: Payer: Self-pay | Admitting: Family

## 2022-08-04 DIAGNOSIS — Z7901 Long term (current) use of anticoagulants: Secondary | ICD-10-CM

## 2022-08-04 DIAGNOSIS — Z952 Presence of prosthetic heart valve: Secondary | ICD-10-CM | POA: Diagnosis not present

## 2022-08-04 LAB — POCT INR: INR: 4.9 — AB (ref 2.0–3.0)

## 2022-08-04 NOTE — Patient Instructions (Signed)
HOLD TODAY and SATURDAY THEN Continue taking 1 tablet daily except 1/2 tablet on Mondays, Wednesdays, and Fridays.  Continue consistency with leafy green intake 3 servings/week.  Recheck INR in 1 week. [facility] [phone]. Took Levaquin  1 dose 08/02/22, none since.

## 2022-08-07 ENCOUNTER — Telehealth: Payer: Self-pay | Admitting: Family

## 2022-08-07 ENCOUNTER — Encounter: Payer: Self-pay | Admitting: *Deleted

## 2022-08-07 NOTE — Telephone Encounter (Signed)
[name] with [facility] called to advise that he saw one of their providers and was giving a 8 day course of antibiotic and he needs to have an INR check at the end of that course. Patient is not picking up when they call so she wanted to relay this message so that patient can be contacted and the INR checked. Her call back # [phone] ext [phone]

## 2022-08-07 NOTE — Telephone Encounter (Signed)
Forwarding to coumadin team.

## 2022-08-07 NOTE — Telephone Encounter (Signed)
Can he call the Coumadin clinic for this?

## 2022-08-10 ENCOUNTER — Ambulatory Visit: Payer: Medicare HMO

## 2022-08-10 DIAGNOSIS — Z952 Presence of prosthetic heart valve: Secondary | ICD-10-CM | POA: Diagnosis not present

## 2022-08-10 DIAGNOSIS — Z7901 Long term (current) use of anticoagulants: Secondary | ICD-10-CM

## 2022-08-10 LAB — POCT INR: INR: 2.4 (ref 2.0–3.0)

## 2022-08-10 NOTE — Patient Instructions (Signed)
Description   Take 1.5 tablets today, then resume same dosage of Warfarin 1 tablet daily except 1/2 tablet on Mondays, Wednesdays, and Fridays.  Continue consistency with leafy green intake 3 servings/week.  Recheck INR in 1 week. [facility] [phone]. Took Levaquin  1 dose 08/02/22, none since.

## 2022-08-11 ENCOUNTER — Encounter: Payer: Self-pay | Admitting: Family

## 2022-08-11 ENCOUNTER — Ambulatory Visit (INDEPENDENT_AMBULATORY_CARE_PROVIDER_SITE_OTHER): Payer: Medicare HMO | Admitting: Family

## 2022-08-11 VITALS — BP 114/53 | HR 64 | Ht 69.0 in | Wt 216.6 lb

## 2022-08-11 DIAGNOSIS — Z794 Long term (current) use of insulin: Secondary | ICD-10-CM

## 2022-08-11 DIAGNOSIS — I422 Other hypertrophic cardiomyopathy: Secondary | ICD-10-CM

## 2022-08-11 DIAGNOSIS — R339 Retention of urine, unspecified: Secondary | ICD-10-CM | POA: Diagnosis not present

## 2022-08-11 DIAGNOSIS — Z7901 Long term (current) use of anticoagulants: Secondary | ICD-10-CM

## 2022-08-11 DIAGNOSIS — E1142 Type 2 diabetes mellitus with diabetic polyneuropathy: Secondary | ICD-10-CM

## 2022-08-11 DIAGNOSIS — N1831 Chronic kidney disease, stage 3a: Secondary | ICD-10-CM

## 2022-08-11 DIAGNOSIS — E1122 Type 2 diabetes mellitus with diabetic chronic kidney disease: Secondary | ICD-10-CM | POA: Diagnosis not present

## 2022-08-11 NOTE — Progress Notes (Unsigned)
Subjective:     Patient ID: [name], male    DOB: [birth year], 77 y.o.   MRN: [id number]  Chief Complaint  Patient presents with   Follow Up    3 month f/u, recently saw Cardiology, INR levels up/down     HPI  Discussed the use of AI scribe software for clinical note transcription with the patient, who gave verbal consent to proceed.  History of Present Illness   The patient, with a history of diabetes, heart failure, kidney disease, and urinary issues, presents for a routine follow-up. The patient's diabetes is managed with NovoLog, Basaglar, Farxiga, and Ozempic. The patient reports blood sugars in the 100s, occasionally rising to 160s if he forgets to take his insulin before meals. He has not noticed significant appetite suppression or weight loss with Ozempic.  The patient's heart failure is managed with Lasix, which was recently increased, and amlodipine was discontinued due to swelling. The patient reports that his feet are less swollen since these changes. He is also on Coumadin, and there have been recent issues with maintaining therapeutic levels due to antibiotic use.  The patient's kidney function is stable, and he continues to see a kidney specialist (Dr. Signe Colt).  He also has urinary issues and has been performing intermittent catheterizations at home. Recently, he has noticed an increase in urinary frequency and dribbling, especially after catheterization. He continues to follow with urology.   The patient also reports a new issue with his foot, describing a sensation of "sponginess" and difficulty distinguishing between the brake and gas pedals when driving. He is requesting a referral to Podiatry for this.      Wt Readings from Last 3 Encounters:  08/11/22 216 lb 9.6 oz (98.2 kg)  07/26/22 214 lb 12.8 oz (97.4 kg)  06/29/22 217 lb (98.4 kg)   Wt Readings from Last 3 Encounters:  08/11/22 216 lb 9.6 oz (98.2 kg)  07/26/22 214 lb 12.8 oz (97.4 kg)  06/29/22 217  lb (98.4 kg)   Lab Results  Component Value Date   HGBA1C 7.1 (A) 06/29/2022       Health Maintenance Due  Topic Date Due   Zoster Vaccines- Shingrix (1 of 2) 03/22/1964   COVID-19 Vaccine (5 - 2023-24 season) 09/30/2021   DTaP/Tdap/Td (3 - Td or Tdap) 03/01/2022    Past Medical History:  Diagnosis Date   Actinic keratosis 02/14/2011   Allergy    Aneurysm of ascending aorta without rupture (HCC) 01/06/2022   Aortic stenosis    Arthritis    Bilateral sensorineural hearing loss 06/16/2009   CAD (coronary artery disease)    cabg   Complication of anesthesia    Coronary atherosclerosis of native coronary artery 06/15/2009   Diabetic polyneuropathy 11/11/2018   Dyslipidemia 11/05/2018   Embolic stroke involving left middle cerebral artery  05/30/2021   Erectile dysfunction 05/18/2009   Essential hypertension 06/16/2009   Expressive aphasia 06/25/2021   External hemorrhoids 03/21/2010   Gout 04/09/2015   Heart murmur    Hemochromatosis 02/14/2011   History of aortic valve replacement 10/13/2009   History of CABG 11/05/2018   2005- 1. Coronary artery bypass grafting x4 with LIMA-LAD, SVG-OM1, SVG-AM and dCFX.  2. Aortic valve replacement with [employer] AVR 3. Septal myomectomy.  4. Myoview low risk 2016   History of hepatitis 01/31/1972   Hypertriglyceridemia 06/13/2010   Hypertrophic obstructive cardiomyopathy 05/06/2008   SP septal myomectomy at surgery 2005 Echo Nov 2018- Hyperdynamic LVEF with severe basal  septal hypertrophy. There is chordal SAM with resting gradient of 16 mmHg that increases to 85 mmHg with Valsalva        Leg weakness, bilateral 01/18/2021   Long term (current) use of anticoagulants 05/22/2018   Lumbar stenosis with neurogenic claudication 02/04/2021   Malignant neoplasm of prostate 06/17/2009   Mechanical heart valve present    Manufacturer: [employer] Duty #: [id number]  Model #: [id number]. Card states MRI compatible with 3 teslas or less.    Obesity, unspecified 04/29/2008   OSA (obstructive sleep apnea) 07/12/2012   moderate-severe; uses CPAP nightly   PONV (postoperative nausea and vomiting)    after CABG- slow to wake up   Presence of prosthetic heart valve    Primary osteoarthritis of left shoulder 10/31/2018   S/P lumbar laminectomy 02/04/2021   Stage 3 chronic kidney disease due to type 2 diabetes mellitus 11/11/2018   Tobacco use 06/16/2009   Type 2 diabetes mellitus with hyperglycemia    Ventral hernia 03/03/2014    Past Surgical History:  Procedure Laterality Date   AORTIC VALVE REPLACEMENT  11/14/2003   [facility]   APPENDECTOMY  1990   CHOLECYSTECTOMY  1990   CORONARY ANGIOGRAPHY  10/11/2021   CORONARY ARTERY BYPASS GRAFT  10/2003   HERNIA REPAIR  1999   right, inguinal   HERNIA REPAIR  2002   left, inguinal   HIP SURGERY  2006   right hip   IR CT HEAD LTD  02/03/2022   IR PERCUTANEOUS ART THROMBECTOMY/INFUSION INTRACRANIAL INC DIAG ANGIO  01/24/2022   LUMBAR LAMINECTOMY/DECOMPRESSION MICRODISCECTOMY Bilateral 02/04/2021   Procedure: Bilateral Lumbar Two-Three Laminectomy;  Surgeon: [name], MD;  Location: [facility] OR;  Service: Neurosurgery;  Laterality: Bilateral;  3C/RM 20   PILONIDAL CYST EXCISION  1964   prostate seed implant  03/2010   RADIOLOGY WITH ANESTHESIA N/A 01/24/2022   Procedure: IR WITH ANESTHESIA;  Surgeon: Radiologist, Medication, MD;  Location: [facility] OR;  Service: Radiology;  Laterality: N/A;   TEE WITHOUT CARDIOVERSION N/A 03/14/2022   Procedure: TRANSESOPHAGEAL ECHOCARDIOGRAM (TEE);  Surgeon: Parke Poisson, MD;  Location: [facility] ENDOSCOPY;  Service: Cardiology;  Laterality: N/A;   TONSILLECTOMY  childhood    Family History  Problem Relation Age of Onset   Heart disease Mother    Stroke Mother    Heart disease Father    Asperger's syndrome Son    Hyperlipidemia Son    Coronary artery disease Brother    Cancer Neg Hx        negative for colon cancer    Social History    Socioeconomic History   Marital status: Married    Spouse name: Not on file   Number of children: Not on file   Years of education: 18   Highest education level: Master's degree (e.g., MA, MS, MEng, MEd, MSW, MBA)  Occupational History   Occupation: Retired  Tobacco Use   Smoking status: Some Days    Types: Cigars    Last attempt to quit: 06/2021    Years since quitting: 1.1   Smokeless tobacco: Never   Tobacco comments:    Pt states he smokes 1 cigar about once a month. 06/01/2021    Pt quit  Cigars 06/2021  Substance and Sexual Activity   Alcohol use: Not Currently    Comment: wine maybe 1 per month   Drug use: No   Sexual activity: Never  Other Topics Concern   Not on file  Social History  Narrative   ** Merged History Encounter ** Right Handed    Lives in a one sotyr home. One step leads to front     Lives with wife   Retired   Occasionally caffeine   Social Determinants of Health   Financial Resource Strain: Low Risk  (08/01/2022)   Overall Financial Resource Strain (CARDIA)    Difficulty of Paying Living Expenses: Not hard at all  Food Insecurity: No Food Insecurity (08/01/2022)   Hunger Vital Sign    Worried About Running Out of Food in the Last Year: Never true    Ran Out of Food in the Last Year: Never true  Transportation Needs: No Transportation Needs (08/01/2022)   PRAPARE - Administrator, Civil Service (Medical): No    Lack of Transportation (Non-Medical): No  Physical Activity: Insufficiently Active (08/01/2022)   Exercise Vital Sign    Days of Exercise per Week: 3 days    Minutes of Exercise per Session: 30 min  Stress: No Stress Concern Present (08/01/2022)   [employer] of Occupational Health - Occupational Stress Questionnaire    Feeling of Stress : Not at all  Social Connections: Moderately Integrated (08/01/2022)   Social Connection and Isolation Panel [NHANES]    Frequency of Communication with Friends and Family: Twice a week     Frequency of Social Gatherings with Friends and Family: Once a week    Attends Religious Services: More than 4 times per year    Active Member of [employer] or Organizations: No    Attends Banker Meetings: Never    Marital Status: Married  Catering manager Violence: Not At Risk (08/02/2022)   Humiliation, Afraid, Rape, and Kick questionnaire    Fear of Current or Ex-Partner: No    Emotionally Abused: No    Physically Abused: No    Sexually Abused: No    Outpatient Medications Prior to Visit  Medication Sig Dispense Refill   AMBULATORY NON FORMULARY MEDICATION cpap cushions            AirFit F20 (Size: Large)           AirFit F30 (Size: Med)   Please FAX the prescription to:  [phone] each prn   aspirin EC 81 MG tablet Take 1 tablet (81 mg total) by mouth daily. Swallow whole. 30 tablet 12   betamethasone dipropionate (DIPROLENE) 0.05 % cream Apply topically 2 (two) times daily as needed. To eczema rash (Patient taking differently: Apply 1 application  topically 2 (two) times daily as needed (to eczema/rash).) 30 g 1   Blood Glucose Monitoring Suppl Supplies MISC Use for monitoring glucose level 100 each 1   colchicine 0.6 MG tablet TAKE 1 TABLET (0.6 MG TOTAL) BY MOUTH 2 (TWO) TIMES DAILY AS NEEDED. 180 tablet 0   Continuous Blood Gluc Sensor (DEXCOM G6 SENSOR) MISC 1 Device by Does not apply route as directed. 9 each 3   dapagliflozin propanediol (FARXIGA) 10 MG TABS tablet Take 1 tablet (10 mg total) by mouth daily before breakfast. 90 tablet 3   erythromycin ophthalmic ointment 1 Application See admin instructions. Apply to affected eye(s) as directed for styes     fenofibrate (TRICOR) 145 MG tablet TAKE 1 TABLET BY MOUTH EVERY DAY 90 tablet 1   furosemide (LASIX) 20 MG tablet TAKE 1 TABLET BY MOUTH EVERY DAY AS NEEDED FOR SWELLING (Patient taking differently: Take 40 mg by mouth daily. Take 40 mg daily by mouth) 90 tablet 1  insulin aspart (NOVOLOG FLEXPEN) 100  UNIT/ML FlexPen Max daily 75 units (Patient taking differently: Inject 14-19 Units into the skin See admin instructions. Inject 16 units into the skin with breakfast, 14 units with lunch, and 16 units with supper/evening meal- may increase to up to 19 units per dose as needed for high blood sugar) 75 mL 3   Insulin Glargine (BASAGLAR KWIKPEN) 100 UNIT/ML Inject 22 Units into the skin daily. (Patient taking differently: Inject 22 Units into the skin at bedtime.) 30 mL 3   Insulin Pen Needle 32G X 4 MM MISC 1 Device by Does not apply route in the morning, at noon, in the evening, and at bedtime. 400 each 3   isosorbide mononitrate (IMDUR) 30 MG 24 hr tablet TAKE 1 TABLET BY MOUTH EVERY DAY 90 tablet 2   ketoconazole (NIZORAL) 2 % cream Apply 1 Application topically daily as needed for irritation.     metoprolol succinate (TOPROL-XL) 25 MG 24 hr tablet TAKE 1 TABLET BY MOUTH EVERY DAY 90 tablet 1   OneTouch Delica Lancets 33G MISC USE AS DIRECTED 100 each 1   rosuvastatin (CRESTOR) 40 MG tablet TAKE 1 TABLET BY MOUTH EVERY DAY (Patient taking differently: Take 40 mg by mouth at bedtime.) 90 tablet 3   Semaglutide,0.25 or 0.5MG /DOS, 2 MG/3ML SOPN Inject 0.5 mg into the skin once a week. 9 mL 3   tacrolimus (PROTOPIC) 0.1 % ointment Apply 1 application  topically 2 (two) times daily as needed (for eczema).     tamsulosin (FLOMAX) 0.4 MG CAPS capsule Take 1 capsule (0.4 mg total) by mouth daily. 30 capsule 0   warfarin (COUMADIN) 7.5 MG tablet 3.75 mg on Monday/Friday and 7.5 mg the rest of the days. 105 tablet 1   clopidogrel (PLAVIX) 75 MG tablet Take 75 mg by mouth daily.     glucose blood (ONETOUCH VERIO) test strip Use as instructed (Patient not taking: Reported on 08/11/2022) 100 strip 12   pantoprazole (PROTONIX) 40 MG tablet Take 1 tablet (40 mg total) by mouth daily. (Patient not taking: Reported on 07/26/2022) 30 tablet 1   potassium chloride SA (KLOR-CON M) 20 MEQ tablet Take 1 tablet (20 mEq  total) by mouth 2 (two) times daily. (Patient not taking: Reported on 07/26/2022) 2 tablet 0   PRESCRIPTION MEDICATION CPAP- At bedtime     clopidogrel (PLAVIX) 75 MG tablet TAKE 1 TABLET BY MOUTH EVERY DAY (Patient not taking: Reported on 07/26/2022) 90 tablet 1   No facility-administered medications prior to visit.    No Known Allergies  ROS See HPI    Objective:    Physical Exam Constitutional:      General: He is not in acute distress.    Appearance: He is well-developed.  HENT:     Head: Normocephalic and atraumatic.  Cardiovascular:     Rate and Rhythm: Normal rate and regular rhythm.     Heart sounds: No murmur heard. Pulmonary:     Effort: Pulmonary effort is normal. No respiratory distress.     Breath sounds: Normal breath sounds. No wheezing or rales.  Musculoskeletal:     Right lower leg: 3+ Edema present.     Left lower leg: 3+ Edema present.  Skin:    General: Skin is warm and dry.  Neurological:     Mental Status: He is alert and oriented to person, place, and time.  Psychiatric:        Behavior: Behavior normal.  Thought Content: Thought content normal.      BP (!) 114/53   Pulse 64   Ht 5\' 9"  (1.753 m)   Wt 216 lb 9.6 oz (98.2 kg)   BMI 31.99 kg/m  Wt Readings from Last 3 Encounters:  08/11/22 216 lb 9.6 oz (98.2 kg)  07/26/22 214 lb 12.8 oz (97.4 kg)  06/29/22 217 lb (98.4 kg)       Assessment & Plan:   Problem List Items Addressed This Visit       Unprioritized   Urinary retention    : Self-catheterization at home, frequency variable. Some urinary incontinence noted. -Continue self-catheterization as needed. Follow up with Urologist Dr. Berneice Heinrich.       Type 2 diabetes mellitus with stage 3a chronic kidney disease, with long-term current use of insulin (HCC) - Primary    Type 2 Diabetes Mellitus: Blood glucose levels variable, ranging from 100s to 200s. On NovoLog, Basaglar, Norco, and Ozempic. Patient unsure of Ozempic's  effectiveness. -Continue current regimen and follow up with Endocrinologist Dr. Lonzo Cloud       Relevant Orders   Urine Microalbumin w/creat. ratio   Type 2 diabetes mellitus with diabetic polyneuropathy, with long-term current use of insulin (HCC)   Relevant Orders   Ambulatory referral to Podiatry   Hypertrophic cardiomyopathy (HCC)    stable on increased Lasix, Amlodipine discontinued due to edema. Kidney function stable. -Continue current regimen as advised by Cardiologist [name].       Diabetic polyneuropathy    Patient reports difficulty distinguishing between brake and gas pedals due to numbness in feet. -Refer to Podiatrist Dr. Lysle Dingwall for further evaluation.       Chronic anticoagulation     Anticoagulation: Recent variability in INR, possibly due to antibiotic use. Patient on Coumadin. -Continue Coumadin and regular monitoring at Coumadin clinic.         I am having Gaberiel Randolf. Renn "Len" maintain his Blood Glucose Monitoring Suppl, betamethasone dipropionate, AMBULATORY NON FORMULARY MEDICATION, Dexcom G6 Sensor, OneTouch Verio, OneTouch Delica Lancets 33G, tacrolimus, colchicine, rosuvastatin, dapagliflozin propanediol, Basaglar KwikPen, NovoLOG FlexPen, Insulin Pen Needle, tamsulosin, PRESCRIPTION MEDICATION, erythromycin, ketoconazole, warfarin, aspirin EC, pantoprazole, potassium chloride SA, isosorbide mononitrate, metoprolol succinate, Semaglutide(0.25 or 0.5MG /DOS), fenofibrate, furosemide, and clopidogrel.  No orders of the defined types were placed in this encounter.

## 2022-08-12 ENCOUNTER — Encounter: Payer: Self-pay | Admitting: Family

## 2022-08-12 DIAGNOSIS — I422 Other hypertrophic cardiomyopathy: Secondary | ICD-10-CM | POA: Insufficient documentation

## 2022-08-12 DIAGNOSIS — Z7901 Long term (current) use of anticoagulants: Secondary | ICD-10-CM

## 2022-08-12 HISTORY — DX: Long term (current) use of anticoagulants: Z79.01

## 2022-08-12 NOTE — Assessment & Plan Note (Signed)
Type 2 Diabetes Mellitus: Blood glucose levels variable, ranging from 100s to 200s. On NovoLog, Basaglar, [place], and Ozempic. Patient unsure of Ozempic's effectiveness. -Continue current regimen and follow up with Endocrinologist Dr. Lonzo Cloud

## 2022-08-12 NOTE — Assessment & Plan Note (Signed)
Patient reports difficulty distinguishing between brake and gas pedals due to numbness in feet. -Refer to Podiatrist Dr. Lysle Dingwall for further evaluation.

## 2022-08-12 NOTE — Assessment & Plan Note (Signed)
:   Self-catheterization at home, frequency variable. Some urinary incontinence noted. -Continue self-catheterization as needed. Follow up with Urologist Dr. Berneice Heinrich.

## 2022-08-12 NOTE — Assessment & Plan Note (Signed)
  Anticoagulation: Recent variability in INR, possibly due to antibiotic use. Patient on Coumadin. -Continue Coumadin and regular monitoring at Coumadin clinic.

## 2022-08-12 NOTE — Patient Instructions (Signed)
VISIT SUMMARY:  During your recent visit, we discussed your ongoing health conditions including heart disease, diabetes, kidney disease, and urinary issues. We also addressed your concerns about chest pain, foot swelling, variable blood sugar levels, urinary incontinence, and numbness in your foot. Your medications have been adjusted by your cardiologist and you are seeing a kidney specialist to monitor your kidney function. You are also managing your diabetes with several medications and performing self-catheterization at home for your urinary issues. We discussed the need for you to follow up with your specialists and continue your current treatment plans.  YOUR PLAN:  -HEART DISEASE: Your heart disease is being managed by adjusting your medications. You have stopped taking amlodipine and increased your Lasix dosage, which has helped reduce the swelling in your feet.  -DIABETES: Your blood sugar levels have been fluctuating, but are mostly within an acceptable range. You are currently taking NovoLog, Basaglar, [place], and Ozempic to manage your diabetes.  -KIDNEY DISEASE: You are seeing a kidney specialist to monitor your kidney function, which is currently stable.  -URINARY ISSUES: You are managing your urinary issues by performing self-catheterization at home. The frequency of catheterization has decreased recently, but you continue to experience urinary incontinence.  -FOOT NEUROPATHY: You have been experiencing numbness in your foot, which is affecting your ability to drive. You will be referred to a podiatrist for further evaluation.  -ANTICOAGULATION: You are taking Coumadin to prevent blood clots. Your INR levels have been variable recently due to antibiotic use, so you will continue to have regular monitoring at the Coumadin clinic.  INSTRUCTIONS:  Please continue to follow up with your specialists as advised. We will also discuss the possibility of restarting Plavix with your  cardiologist. It's important to continue your current treatment plans and medications as prescribed. If you have any concerns or if your symptoms worsen, please contact our office immediately.

## 2022-08-12 NOTE — Assessment & Plan Note (Signed)
stable on increased Lasix, Amlodipine discontinued due to edema. Kidney function stable. -Continue current regimen as advised by Cardiologist [name].

## 2022-08-16 ENCOUNTER — Ambulatory Visit: Payer: Medicare HMO | Attending: Family | Admitting: Physical Therapy

## 2022-08-16 ENCOUNTER — Other Ambulatory Visit: Payer: Self-pay

## 2022-08-16 ENCOUNTER — Encounter: Payer: Self-pay | Admitting: Physical Therapy

## 2022-08-16 DIAGNOSIS — M6281 Muscle weakness (generalized): Secondary | ICD-10-CM | POA: Diagnosis not present

## 2022-08-16 DIAGNOSIS — Z7409 Other reduced mobility: Secondary | ICD-10-CM | POA: Diagnosis not present

## 2022-08-16 DIAGNOSIS — R2681 Unsteadiness on feet: Secondary | ICD-10-CM | POA: Insufficient documentation

## 2022-08-16 DIAGNOSIS — Z8673 Personal history of transient ischemic attack (TIA), and cerebral infarction without residual deficits: Secondary | ICD-10-CM | POA: Insufficient documentation

## 2022-08-16 NOTE — Therapy (Signed)
OUTPATIENT PHYSICAL THERAPY LOWER EXTREMITY EVALUATION   Patient Name: [name] MRN: [id number] DOB:03/07/1945, 77 y.o., male Today's Date: 08/16/2022  END OF SESSION:  PT End of Session - 08/16/22 1204     Visit Number 1    Number of Visits 4    Date for PT Re-Evaluation 09/13/22    Authorization Type [employer] [facility]    Authorization Time Period 08/16/22 to 09/27/22    Progress Note Due on Visit 10    PT Start Time 1108   pt few minutes late   PT Stop Time 1147    PT Time Calculation (min) 39 min    Activity Tolerance Patient tolerated treatment well    Behavior During Therapy [facility] for tasks assessed/performed             Past Medical History:  Diagnosis Date   Actinic keratosis 02/14/2011   Allergy    Aneurysm of ascending aorta without rupture (HCC) 01/06/2022   Aortic stenosis    Arthritis    Bilateral sensorineural hearing loss 06/16/2009   CAD (coronary artery disease)    cabg   Complication of anesthesia    Coronary atherosclerosis of native coronary artery 06/15/2009   Diabetic polyneuropathy 11/11/2018   Dyslipidemia 11/05/2018   Embolic stroke involving left middle cerebral artery  05/30/2021   Erectile dysfunction 05/18/2009   Essential hypertension 06/16/2009   Expressive aphasia 06/25/2021   External hemorrhoids 03/21/2010   Gout 04/09/2015   Heart murmur    Hemochromatosis 02/14/2011   History of aortic valve replacement 10/13/2009   History of CABG 11/05/2018   2005- 1. Coronary artery bypass grafting x4 with LIMA-LAD, SVG-OM1, SVG-AM and dCFX.  2. Aortic valve replacement with [employer] AVR 3. Septal myomectomy.  4. Myoview low risk 2016   History of hepatitis 01/31/1972   Hypertriglyceridemia 06/13/2010   Hypertrophic obstructive cardiomyopathy 05/06/2008   SP septal myomectomy at surgery 2005 Echo Nov 2018- Hyperdynamic LVEF with severe basal septal hypertrophy. There is chordal SAM with resting gradient of 16 mmHg that increases to 85 mmHg  with Valsalva        Leg weakness, bilateral 01/18/2021   Long term (current) use of anticoagulants 05/22/2018   Lumbar stenosis with neurogenic claudication 02/04/2021   Malignant neoplasm of prostate 06/17/2009   Mechanical heart valve present    Manufacturer: [employer] #: [id number]  Model #: [id number]. Card states MRI compatible with 3 teslas or less.   Obesity, unspecified 04/29/2008   OSA (obstructive sleep apnea) 07/12/2012   moderate-severe; uses CPAP nightly   PONV (postoperative nausea and vomiting)    after CABG- slow to wake up   Presence of prosthetic heart valve    Primary osteoarthritis of left shoulder 10/31/2018   S/P lumbar laminectomy 02/04/2021   Stage 3 chronic kidney disease due to type 2 diabetes mellitus 11/11/2018   Tobacco use 06/16/2009   Type 2 diabetes mellitus with hyperglycemia    Ventral hernia 03/03/2014   Past Surgical History:  Procedure Laterality Date   AORTIC VALVE REPLACEMENT  11/14/2003   [facility]   APPENDECTOMY  1990   CHOLECYSTECTOMY  1990   CORONARY ANGIOGRAPHY  10/11/2021   CORONARY ARTERY BYPASS GRAFT  10/2003   HERNIA REPAIR  1999   right, inguinal   HERNIA REPAIR  2002   left, inguinal   HIP SURGERY  2006   right hip   IR CT HEAD LTD  02/03/2022   IR PERCUTANEOUS ART  THROMBECTOMY/INFUSION INTRACRANIAL INC DIAG ANGIO  01/24/2022   LUMBAR LAMINECTOMY/DECOMPRESSION MICRODISCECTOMY Bilateral 02/04/2021   Procedure: Bilateral Lumbar Two-Three Laminectomy;  Surgeon: [name], MD;  Location: [facility] OR;  Service: Neurosurgery;  Laterality: Bilateral;  3C/RM 20   PILONIDAL CYST EXCISION  1964   prostate seed implant  03/2010   RADIOLOGY WITH ANESTHESIA N/A 01/24/2022   Procedure: IR WITH ANESTHESIA;  Surgeon: Radiologist, Medication, MD;  Location: [facility] OR;  Service: Radiology;  Laterality: N/A;   TEE WITHOUT CARDIOVERSION N/A 03/14/2022   Procedure: TRANSESOPHAGEAL ECHOCARDIOGRAM (TEE);  Surgeon: Parke Poisson, MD;   Location: [facility] ENDOSCOPY;  Service: Cardiology;  Laterality: N/A;   TONSILLECTOMY  childhood   Patient Active Problem List   Diagnosis Date Noted   Hypertrophic cardiomyopathy (HCC) 08/12/2022   Chronic anticoagulation 08/12/2022   Hypotension 04/27/2022   H/O: CVA (cerebrovascular accident) 03/14/2022   Urinary retention 01/26/2022   Acute ischemic stroke (HCC) 01/24/2022   Middle cerebral artery embolism, left 01/24/2022   Aneurysm of ascending aorta without rupture (HCC) 01/06/2022   Type 2 diabetes mellitus with stage 3a chronic kidney disease, with long-term current use of insulin (HCC) 10/30/2021   Diabetes mellitus (HCC) 10/30/2021   Type 2 diabetes mellitus with diabetic polyneuropathy, with long-term current use of insulin (HCC) 10/30/2021   NSTEMI (non-ST elevated myocardial infarction) (HCC) 10/09/2021   Paroxysmal atrial fibrillation (HCC) 10/09/2021   Presence of prosthetic heart valve    Type 2 diabetes mellitus with hyperglycemia    Expressive aphasia 06/25/2021   CVA (cerebral vascular accident) (HCC) 05/2021   S/P lumbar laminectomy 02/04/2021   Lumbar stenosis with neurogenic claudication 02/04/2021   History of falling 01/30/2021   Diabetic polyneuropathy 11/11/2018   Stage 3 chronic kidney disease due to type 2 diabetes mellitus 11/11/2018   History of CABG 11/05/2018   Dyslipidemia 11/05/2018   Primary osteoarthritis of left shoulder 10/31/2018   Long term (current) use of anticoagulants 05/22/2018   Ventral hernia 03/03/2014   OSA (obstructive sleep apnea) 07/12/2012   Hemochromatosis 02/14/2011   Actinic keratosis 02/14/2011   Hypertriglyceridemia 06/13/2010   External hemorrhoids 03/21/2010   History of aortic valve replacement 10/13/2009   Malignant neoplasm of prostate (HCC) 06/17/2009   Bilateral sensorineural hearing loss 06/16/2009   Essential hypertension 06/16/2009   Coronary atherosclerosis of native coronary artery 06/15/2009   Erectile  dysfunction 05/18/2009   Obesity, unspecified 04/29/2008    PCP: Sandford Craze, NP  REFERRING PROVIDER: Sandford Craze, NP  REFERRING DIAG: [phone] (ICD-10-CM) - H/O: CVA (cerebrovascular accident)  THERAPY DIAG:  Muscle weakness (generalized)  Impaired functional mobility and activity tolerance  Unsteadiness on feet  Rationale for Evaluation and Treatment: Rehabilitation  ONSET DATE: CVA in December 2023  SUBJECTIVE:   SUBJECTIVE STATEMENT:  Had a stroke in December and it affected my right side. I do have some residual things going on- tremor when I'm trying to use my right hand, working on residual effects from bladder, and I'd like PT to work on my right foot "It feels like its waking up". Having a hard time feeling brake and gas pedals with my right foot. My balance is good. Have some cracking from my knee sometimes from OA. Feel like strength could be an issue, when the foot is waking up there is some weakness. Have been trying to wake it up in the gym. They think that my INR levels are causing the strokes, working on getting it in an exact range to prevent more CVAs. I  will use my walker if I'm going far especially in the heat.   PERTINENT HISTORY: Aneurysm of ascending aorta, aortic stenosis, B sensorineural hearing loss, CAD, DM with neuropathy, CVA of MCA, HTN, expressive aphasia, gout, CABG, gout, cardiomyopathy, lx stenosis, shoulder OA, lumbar laminectomy, CKD, ventral hernia, aortic valve replacement, hernia repair, hip surgery, hx multiple strokes (3-4) PAIN:  Are you having pain? No 0/10, "don't really have pain"   PRECAUTIONS: None    WEIGHT BEARING RESTRICTIONS: No  FALLS:  Has patient fallen in last 6 months? No  LIVING ENVIRONMENT: Lives with: lives with their family Lives in: House/apartment Stairs: No Has following equipment at home: Environmental consultant - 2 wheeled, Crutches, and Grab bars  OCCUPATION: retired   PLOF: Independent, Independent with  basic ADLs, Independent with gait, and Independent with transfers  PATIENT GOALS: "get my right leg to wake up "  NEXT MD VISIT: Referring in 3 months   OBJECTIVE:   DIAGNOSTIC FINDINGS:   PATIENT SURVEYS:  ABC scale 90.9%  COGNITION: Overall cognitive status: Within functional limits for tasks assessed     SENSATION: "Feels like R LE is still waking up"       LOWER EXTREMITY MMT:  MMT Right eval Left eval  Hip flexion 4- 4+  Hip extension    Hip abduction 4+ 5  Hip adduction    Hip internal rotation    Hip external rotation    Knee flexion 4 4  Knee extension 4+ 4+  Ankle dorsiflexion 5 5  Ankle plantarflexion    Ankle inversion    Ankle eversion     (Blank rows = not tested)    FUNCTIONAL TESTS:  3 minute walk test: 554ft no device, mild unsteadiness on turns, moderate SOB by end of gait period   Able to hold SLS R LE 30 seconds, tandem stance with R foot in the back x30 seconds  GAIT: Distance walked: in clinic distances   Assistive device utilized: None Level of assistance: Complete Independence Comments: varus knees at baseline, mild unsteadiness on turns    TODAY'S TREATMENT:                                                                                                                              DATE:   Eval  Objective measures, care planning, education as appropriate  TherEx  Ankle DF + 3 second hold x10  B Bridges with RLE staggered closer to glutes x10 STS with RLE staggered back x10      PATIENT EDUCATION:  Education details: exam findings, neuroplasticity and recovery after CVA, POC, HEP  Person educated: Patient Education method: Explanation, Demonstration, and Handouts Education comprehension: verbalized understanding, returned demonstration, and needs further education  HOME EXERCISE PROGRAM: Access Code: [email or web address] URL: [email or web address] Date: 08/16/2022 Prepared by: Nedra Hai  Exercises - Seated  Ankle Dorsiflexion Stretch  - 2 x daily - 7 x weekly - 1 sets - 10 reps - 5  seconds  hold - Supine Bridge  - 2 x daily - 7 x weekly - 1 sets - 10 reps - 2 seconds  hold - Sit to Stand with Arms Crossed  - 1 x daily - 7 x weekly - 1 sets - 6-10 reps  ASSESSMENT:  CLINICAL IMPRESSION: Patient is a 77 y.o. M who was seen today for physical therapy evaluation and treatment for skilled PT care s/p CVA. He really is doing quite well objectively with main identified impairments being functional weakness, impaired R LE sensation, impaired functional activity tolerance, and mild unsteadiness. He is very motivated to improve and would like PT to focus on building advanced HEP that he can use in the gym to address his concerns about his R LE. Shows excellent potential to make progress with PT.    OBJECTIVE IMPAIRMENTS: Abnormal gait, decreased activity tolerance, decreased balance, difficulty walking, decreased strength, and obesity.   ACTIVITY LIMITATIONS: locomotion level  PARTICIPATION LIMITATIONS: driving, shopping, and community activity  PERSONAL FACTORS: Age, Education, Fitness, Past/current experiences, Social background, and Time since onset of injury/illness/exacerbation are also affecting patient's functional outcome.   REHAB POTENTIAL: Good  CLINICAL DECISION MAKING: Evolving/moderate complexity  EVALUATION COMPLEXITY: Moderate   GOALS: Goals reviewed with patient? Yes  SHORT TERM GOALS: Target date: 09/13/2022     Will be compliant with appropriate progressive HEP  Baseline: Goal status: INITIAL  2.  MMT to be 5/5 globally  Baseline:  Goal status: INITIAL  3.  Will report 50% improvement in proprioception and 50% improvement in feelings of RLE "being awake"  Baseline:  Goal status: INITIAL  4.  ABC score to improve by 5 points to show subjective progress  Baseline:  Goal status: INITIAL  5.  Will ambulate 63ft in with minimal SOB  Baseline:  Goal status:  INITIAL    LONG TERM GOALS: Target date: not appropriate at this time      PLAN:  PT FREQUENCY: 1x/week  PT DURATION: 4 weeks  PLANNED INTERVENTIONS: Therapeutic exercises, Therapeutic activity, Neuromuscular re-education, Balance training, Gait training, Patient/Family education, Self Care, Stair training, Manual therapy, and Re-evaluation  PLAN FOR NEXT SESSION: weekly HEP updates he can use in the gym; focus on strength and endurance, balance to a lesser extent   Nedra Hai, PT, DPT 08/16/22 12:05 PM

## 2022-08-17 ENCOUNTER — Ambulatory Visit: Payer: Medicare HMO | Attending: Internal Medicine

## 2022-08-17 DIAGNOSIS — Z952 Presence of prosthetic heart valve: Secondary | ICD-10-CM

## 2022-08-17 LAB — POCT INR: INR: 4.4 — AB (ref 2.0–3.0)

## 2022-08-17 NOTE — Patient Instructions (Signed)
Description   HOLD today's dose and then resume same dosage of Warfarin 1 tablet daily except 1/2 tablet on Mondays, Wednesdays, and Fridays.  Continue consistency with leafy green intake 3 servings/week.  Recheck INR in 2 weeks.  [facility] [phone].

## 2022-08-18 DIAGNOSIS — E119 Type 2 diabetes mellitus without complications: Secondary | ICD-10-CM | POA: Diagnosis not present

## 2022-08-21 ENCOUNTER — Encounter: Payer: Self-pay | Admitting: Internal Medicine

## 2022-08-22 DIAGNOSIS — G4733 Obstructive sleep apnea (adult) (pediatric): Secondary | ICD-10-CM | POA: Diagnosis not present

## 2022-08-22 DIAGNOSIS — I10 Essential (primary) hypertension: Secondary | ICD-10-CM | POA: Diagnosis not present

## 2022-08-23 ENCOUNTER — Other Ambulatory Visit: Payer: Self-pay

## 2022-08-23 MED ORDER — DAPAGLIFLOZIN PROPANEDIOL 10 MG PO TABS: 10.0000 mg | ORAL_TABLET | Freq: Every day | ORAL | 3 refills | Status: DC

## 2022-08-24 DIAGNOSIS — M792 Neuralgia and neuritis, unspecified: Secondary | ICD-10-CM | POA: Diagnosis not present

## 2022-08-24 DIAGNOSIS — G5761 Lesion of plantar nerve, right lower limb: Secondary | ICD-10-CM | POA: Diagnosis not present

## 2022-08-24 DIAGNOSIS — R601 Generalized edema: Secondary | ICD-10-CM | POA: Diagnosis not present

## 2022-08-24 DIAGNOSIS — M2041 Other hammer toe(s) (acquired), right foot: Secondary | ICD-10-CM | POA: Diagnosis not present

## 2022-08-31 ENCOUNTER — Ambulatory Visit: Payer: Medicare HMO | Attending: Cardiology

## 2022-08-31 DIAGNOSIS — Z952 Presence of prosthetic heart valve: Secondary | ICD-10-CM

## 2022-08-31 DIAGNOSIS — Z5181 Encounter for therapeutic drug level monitoring: Secondary | ICD-10-CM | POA: Diagnosis not present

## 2022-08-31 LAB — POCT INR: INR: 3.5 — AB (ref 2.0–3.0)

## 2022-08-31 NOTE — Patient Instructions (Signed)
Description   Continue taking 1 tablet daily except 1/2 tablet on Mondays, Wednesdays, and Fridays.  Continue consistency with leafy green intake 3 servings/week.  Recheck INR in 3 weeks.  [facility] [phone].

## 2022-09-04 ENCOUNTER — Encounter: Payer: Self-pay | Admitting: Cardiology

## 2022-09-05 ENCOUNTER — Other Ambulatory Visit: Payer: Self-pay

## 2022-09-05 MED ORDER — FUROSEMIDE 40 MG PO TABS: 40.0000 mg | ORAL_TABLET | Freq: Every day | ORAL | 3 refills | Status: DC

## 2022-09-05 NOTE — Therapy (Signed)
OUTPATIENT PHYSICAL THERAPY TREATMENT   Patient Name: [name] MRN: [id number] DOB:1945-09-29, 77 y.o., male Today's Date: 09/06/2022  END OF SESSION:  PT End of Session - 09/06/22 1104     Visit Number 2    Number of Visits 4    Date for PT Re-Evaluation 09/27/22    Authorization Type [employer] MCR    Authorization Time Period --    Progress Note Due on Visit 10    PT Start Time 1104    PT Stop Time 1144    PT Time Calculation (min) 40 min    Activity Tolerance Patient tolerated treatment well    Behavior During Therapy [facility] for tasks assessed/performed              Past Medical History:  Diagnosis Date   Actinic keratosis 02/14/2011   Allergy    Aneurysm of ascending aorta without rupture (HCC) 01/06/2022   Aortic stenosis    Arthritis    Bilateral sensorineural hearing loss 06/16/2009   CAD (coronary artery disease)    cabg   Complication of anesthesia    Coronary atherosclerosis of native coronary artery 06/15/2009   Diabetic polyneuropathy 11/11/2018   Dyslipidemia 11/05/2018   Embolic stroke involving left middle cerebral artery  05/30/2021   Erectile dysfunction 05/18/2009   Essential hypertension 06/16/2009   Expressive aphasia 06/25/2021   External hemorrhoids 03/21/2010   Gout 04/09/2015   Heart murmur    Hemochromatosis 02/14/2011   History of aortic valve replacement 10/13/2009   History of CABG 11/05/2018   2005- 1. Coronary artery bypass grafting x4 with LIMA-LAD, SVG-OM1, SVG-AM and dCFX.  2. Aortic valve replacement with [employer] AVR 3. Septal myomectomy.  4. Myoview low risk 2016   History of hepatitis 01/31/1972   Hypertriglyceridemia 06/13/2010   Hypertrophic obstructive cardiomyopathy 05/06/2008   SP septal myomectomy at surgery 2005 Echo Nov 2018- Hyperdynamic LVEF with severe basal septal hypertrophy. There is chordal SAM with resting gradient of 16 mmHg that increases to 85 mmHg with Valsalva        Leg weakness, bilateral 01/18/2021    Long term (current) use of anticoagulants 05/22/2018   Lumbar stenosis with neurogenic claudication 02/04/2021   Malignant neoplasm of prostate 06/17/2009   Mechanical heart valve present    Manufacturer: [employer] Duty #: [id number]  Model #: [id number]. Card states MRI compatible with 3 teslas or less.   Obesity, unspecified 04/29/2008   OSA (obstructive sleep apnea) 07/12/2012   moderate-severe; uses CPAP nightly   PONV (postoperative nausea and vomiting)    after CABG- slow to wake up   Presence of prosthetic heart valve    Primary osteoarthritis of left shoulder 10/31/2018   S/P lumbar laminectomy 02/04/2021   Stage 3 chronic kidney disease due to type 2 diabetes mellitus 11/11/2018   Tobacco use 06/16/2009   Type 2 diabetes mellitus with hyperglycemia    Ventral hernia 03/03/2014   Past Surgical History:  Procedure Laterality Date   AORTIC VALVE REPLACEMENT  11/14/2003   [facility]   APPENDECTOMY  1990   CHOLECYSTECTOMY  1990   CORONARY ANGIOGRAPHY  10/11/2021   CORONARY ARTERY BYPASS GRAFT  10/2003   HERNIA REPAIR  1999   right, inguinal   HERNIA REPAIR  2002   left, inguinal   HIP SURGERY  2006   right hip   IR CT HEAD LTD  02/03/2022   IR PERCUTANEOUS ART THROMBECTOMY/INFUSION INTRACRANIAL INC DIAG ANGIO  01/24/2022  LUMBAR LAMINECTOMY/DECOMPRESSION MICRODISCECTOMY Bilateral 02/04/2021   Procedure: Bilateral Lumbar Two-Three Laminectomy;  Surgeon: [name], MD;  Location: [facility] OR;  Service: Neurosurgery;  Laterality: Bilateral;  3C/RM 20   PILONIDAL CYST EXCISION  1964   prostate seed implant  03/2010   RADIOLOGY WITH ANESTHESIA N/A 01/24/2022   Procedure: IR WITH ANESTHESIA;  Surgeon: Radiologist, Medication, MD;  Location: [facility] OR;  Service: Radiology;  Laterality: N/A;   TEE WITHOUT CARDIOVERSION N/A 03/14/2022   Procedure: TRANSESOPHAGEAL ECHOCARDIOGRAM (TEE);  Surgeon: Parke Poisson, MD;  Location: [facility] ENDOSCOPY;  Service: Cardiology;  Laterality:  N/A;   TONSILLECTOMY  childhood   Patient Active Problem List   Diagnosis Date Noted   Hypertrophic cardiomyopathy (HCC) 08/12/2022   Chronic anticoagulation 08/12/2022   Hypotension 04/27/2022   H/O: CVA (cerebrovascular accident) 03/14/2022   Urinary retention 01/26/2022   Acute ischemic stroke (HCC) 01/24/2022   Middle cerebral artery embolism, left 01/24/2022   Aneurysm of ascending aorta without rupture (HCC) 01/06/2022   Type 2 diabetes mellitus with stage 3a chronic kidney disease, with long-term current use of insulin (HCC) 10/30/2021   Diabetes mellitus (HCC) 10/30/2021   Type 2 diabetes mellitus with diabetic polyneuropathy, with long-term current use of insulin (HCC) 10/30/2021   NSTEMI (non-ST elevated myocardial infarction) (HCC) 10/09/2021   Paroxysmal atrial fibrillation (HCC) 10/09/2021   Presence of prosthetic heart valve    Type 2 diabetes mellitus with hyperglycemia    Expressive aphasia 06/25/2021   CVA (cerebral vascular accident) (HCC) 05/2021   S/P lumbar laminectomy 02/04/2021   Lumbar stenosis with neurogenic claudication 02/04/2021   History of falling 01/30/2021   Diabetic polyneuropathy 11/11/2018   Stage 3 chronic kidney disease due to type 2 diabetes mellitus 11/11/2018   History of CABG 11/05/2018   Dyslipidemia 11/05/2018   Primary osteoarthritis of left shoulder 10/31/2018   Long term (current) use of anticoagulants 05/22/2018   Ventral hernia 03/03/2014   OSA (obstructive sleep apnea) 07/12/2012   Hemochromatosis 02/14/2011   Actinic keratosis 02/14/2011   Hypertriglyceridemia 06/13/2010   External hemorrhoids 03/21/2010   History of aortic valve replacement 10/13/2009   Malignant neoplasm of prostate (HCC) 06/17/2009   Bilateral sensorineural hearing loss 06/16/2009   Essential hypertension 06/16/2009   Coronary atherosclerosis of native coronary artery 06/15/2009   Erectile dysfunction 05/18/2009   Obesity, unspecified 04/29/2008     PCP: Sandford Craze, NP  REFERRING PROVIDER: Sandford Craze, NP  REFERRING DIAG: [id number] (ICD-10-CM) - H/O: CVA (cerebrovascular accident)  THERAPY DIAG:  Muscle weakness (generalized)  Impaired functional mobility and activity tolerance  Unsteadiness on feet  RATIONALE FOR EVALUATION AND TREATMENT: Rehabilitation  ONSET DATE: CVA in December 2023  NEXT MD VISIT: 11/13/22   SUBJECTIVE:   SUBJECTIVE STATEMENT: Pt states there is only 1 thing he wants to work on and that is his R foot.  EVAL: Had a stroke in December and it affected my right side. I do have some residual things going on- tremor when I'm trying to use my right hand, working on residual effects from bladder, and I'd like PT to work on my right foot "It feels like its waking up". Having a hard time feeling brake and gas pedals with my right foot. My balance is good. Have some cracking from my knee sometimes from OA. Feel like strength could be an issue, when the foot is waking up there is some weakness. Have been trying to wake it up in the gym. They think that my INR levels are  causing the strokes, working on getting it in an exact range to prevent more CVAs. I will use my walker if I'm going far especially in the heat.   PERTINENT HISTORY: Aneurysm of ascending aorta, aortic stenosis, B sensorineural hearing loss - extremely HOH, CAD s/p CABG, cardiomyopathy, AVR, chronic anticoagulation, DM-II with neuropathy, CVA of MCA, HTN, expressive aphasia, gout, lumbar stenosis s/p lumbar laminectomy and decompression 2023, shoulder OA, CKD, ventral hernia, hernia repair, hip surgery, hx multiple strokes (3-4), prostate cancer  PAIN:  Are you having pain? No 0/10, "don't really have pain"   PRECAUTIONS: None  WEIGHT BEARING RESTRICTIONS: No  FALLS:  Has patient fallen in last 6 months? No  LIVING ENVIRONMENT: Lives with: lives with their family Lives in: House/apartment Stairs: No Has following  equipment at home: Environmental consultant - 2 wheeled, Crutches, and Grab bars  OCCUPATION: retired   PLOF: Independent, Independent with basic ADLs, Independent with gait, and Independent with transfers  PATIENT GOALS: "get my right leg to wake up "     OBJECTIVE:   DIAGNOSTIC FINDINGS:  04/09/22 - MRI Brain IMPRESSION: 1. Positive for a tiny acute cortical infarct of the left frontal operculum, within an area of chronic Left MCA middle division infarct with encephalomalacia and laminar necrosis. No acute hemorrhage or mass effect. 2. Conspicuous increased intrinsic T1 signal in the left lentiform nuclei since December. Nonspecific, but favor this is a form of Wallerian degeneration secondary to the chronic ischemia in #1. 3. Elsewhere stable noncontrast MRI appearance of the brain since last year., including chronic bilateral cerebellar infarcts and occasional punctate chronic microhemorrhages.  04/09/22 - CT Head: IMPRESSION: 1. No acute intracranial abnormality. 2. Chronic left MCA and cerebellar infarcts.  PATIENT SURVEYS:  ABC scale 90.9%  COGNITION: Overall cognitive status: Within functional limits for tasks assessed     SENSATION: "Feels like R LE is still waking up"   LOWER EXTREMITY MMT:  MMT Right eval Left eval  Hip flexion 4- 4+  Hip extension    Hip abduction 4+ 5  Hip adduction    Hip internal rotation    Hip external rotation    Knee flexion 4 4  Knee extension 4+ 4+  Ankle dorsiflexion 5 5  Ankle plantarflexion    Ankle inversion    Ankle eversion     (Blank rows = not tested)  FUNCTIONAL TESTS:  3 minute walk test: 505 ft no device, mild unsteadiness on turns, moderate SOB by end of gait period   Able to hold SLS R LE 30 seconds, tandem stance with R foot in the back x30 seconds  GAIT: Distance walked: in clinic distances  Assistive device utilized: None Level of assistance: Complete Independence Comments: varus knees at baseline, mild unsteadiness  on turns    TODAY'S TREATMENT:  DATE:   09/05/25 THERAPEUTIC EXERCISE: to improve flexibility, strength and mobility.  Demonstration, verbal and tactile cues throughout for technique.  NuStep - L5 x 6 min R eccentric heel lowering on level ground x 10 R eccentric heel lowering on step 2 x 10 R eccentric single heel raises with leg press demonstrated but not attempted due to fatigue from prior exercises R isometric ankle inversion into ball against stationary L foot 10 x 5" R isometric ankle inversion into ball against stationary PT's foot 10 x 5" (demonstrated using wall for home performance) R seated toe extension x 10 - very limited ROM R seated toe curl x 10 - very limited ROM R towel scrunches - unable (instructions provided for HEP to work on initiating ROM) R toe spreading - unable (instructions provided for HEP to work on initiating ROM)   08/16/22 - Eval Objective measures, care planning, education as appropriate  TherEx Ankle DF + 3 second hold x 10  B Bridges with RLE staggered closer to glutes x10 STS with RLE staggered back x10      PATIENT EDUCATION:  Education details: HEP progression - R foot and ankle strengthening    Person educated: Patient Education method: Explanation, Demonstration, and Handouts Education comprehension: verbalized understanding, returned demonstration, and needs further education  HOME EXERCISE PROGRAM: Access Code: [email or web address] URL: [email or web address] Date: 09/06/2022 Prepared by: [name]  Exercises - Seated Ankle Dorsiflexion Stretch  - 2 x daily - 7 x weekly - 1 sets - 10 reps - 5 seconds  hold - Supine Bridge  - 2 x daily - 7 x weekly - 1 sets - 10 reps - 2 seconds  hold - Sit to Stand with Arms Crossed  - 1 x daily - 7 x weekly - 1 sets - 6-10 reps - Eccentric Heel Lowering on Step (Mirrored)   - 1 x daily - 7 x weekly - 2 sets - 10 reps - 3-5 sec hold - Eccentric Single Heel Raises with Leg Press  2 Up, 1 Down  (Mirrored)  - 1 x daily - 7 x weekly - 2 sets - 10 reps - 3 sec hold - Isometric Ankle Inversion at Wall  - 1 x daily - 7 x weekly - 2 sets - 10 reps - 3-5 sec hold - Isometric Ankle Eversion at Wall  - 1 x daily - 7 x weekly - 2 sets - 10 reps - 3-5 sec hold - Seated Toe Curl  - 1 x daily - 7 x weekly - 2 sets - 10 reps - 3 sec hold - Towel Scrunches  - 2-3 x daily - 7 x weekly - 2 sets - 15 reps - Seated Lesser Toes Extension  - 1 x daily - 7 x weekly - 2 sets - 10 reps - 3 sec hold - Toe Spreading  - 1 x daily - 7 x weekly - 2 sets - 10 reps - 3 sec hold   ASSESSMENT:  CLINICAL IMPRESSION: David "Len" reports the initial HEP exercises are not addressing what he wants to work on - improving control of R foot and reducing impact of his neuropathy. Progressed R toe/foot/ankle ROM and strengthening per pt's wishes but also provided education on need to address more proximal strength and stability based on deficits identified on eval. He demonstrates increased difficultly actively initiating any toe ROM as well as foot/ankle inversion, so exercises provided to help "wake up" these muscles. He also notes difficulty managing his R foot  on the gas pedal and brake while driving, therefore worked on ankle DF/PF ROM and strengthening including body weight resisted negative heel raises.  David "Len" will benefit from skilled PT to address above deficits to improve mobility and activity tolerance with improved R foot/ankle proprioception for decreased fall risk.   OBJECTIVE IMPAIRMENTS: Abnormal gait, decreased activity tolerance, decreased balance, difficulty walking, decreased strength, and obesity.   ACTIVITY LIMITATIONS: locomotion level  PARTICIPATION LIMITATIONS: driving, shopping, and community activity  PERSONAL FACTORS: Age, Education, Fitness, Past/current experiences,  Social background, and Time since onset of injury/illness/exacerbation are also affecting patient's functional outcome.   REHAB POTENTIAL: Good  CLINICAL DECISION MAKING: Evolving/moderate complexity  EVALUATION COMPLEXITY: Moderate   GOALS: Goals reviewed with patient? Yes  SHORT TERM GOALS: Target date: 09/27/2022    Will be compliant with appropriate progressive HEP  Baseline: Goal status: IN PROGRESS  2.  MMT to be 5/5 globally  Baseline:  Goal status: IN PROGRESS  3.  Will report 50% improvement in proprioception and 50% improvement in feelings of R LE "being awake"  Baseline:  Goal status: IN PROGRESS  4.  ABC score to improve by 5 points to show subjective progress  Baseline: 90.9% Goal status: IN PROGRESS  5.  Will ambulate 647ft in with minimal SOB  Baseline:  Goal status: IN PROGRESS  LONG TERM GOALS: Target date: not appropriate at this time     PLAN:  PT FREQUENCY: 1x/week  PT DURATION: 4 weeks  PLANNED INTERVENTIONS: Therapeutic exercises, Therapeutic activity, Neuromuscular re-education, Balance training, Gait training, Patient/Family education, Self Care, Stair training, Manual therapy, and Re-evaluation  PLAN FOR NEXT SESSION: Progress R foot and ankle strengthening and proprioceptive/balance training; proximal LE strengthening; weekly HEP updates he can use in the gym    [name], PT  09/06/22 12:14 PM

## 2022-09-05 NOTE — Telephone Encounter (Signed)
Pt taking 40 mg once daily per note from [name] 07/26/2022. Updated prescription sent to pharmacy.

## 2022-09-06 ENCOUNTER — Ambulatory Visit: Payer: Medicare HMO | Attending: Family | Admitting: Physical Therapy

## 2022-09-06 ENCOUNTER — Encounter: Payer: Self-pay | Admitting: Physical Therapy

## 2022-09-06 DIAGNOSIS — M6281 Muscle weakness (generalized): Secondary | ICD-10-CM | POA: Insufficient documentation

## 2022-09-06 DIAGNOSIS — Z7409 Other reduced mobility: Secondary | ICD-10-CM | POA: Diagnosis not present

## 2022-09-06 DIAGNOSIS — R2681 Unsteadiness on feet: Secondary | ICD-10-CM | POA: Diagnosis not present

## 2022-09-11 ENCOUNTER — Other Ambulatory Visit: Payer: Self-pay

## 2022-09-11 MED ORDER — DAPAGLIFLOZIN PROPANEDIOL 10 MG PO TABS: 10.0000 mg | ORAL_TABLET | Freq: Every day | ORAL | 3 refills | Status: DC

## 2022-09-11 MED ORDER — DAPAGLIFLOZIN PROPANEDIOL 10 MG PO TABS: 10.0000 mg | ORAL_TABLET | Freq: Every day | ORAL | 0 refills | Status: DC

## 2022-09-12 ENCOUNTER — Other Ambulatory Visit: Payer: Self-pay | Admitting: Cardiology

## 2022-09-12 DIAGNOSIS — E785 Hyperlipidemia, unspecified: Secondary | ICD-10-CM

## 2022-09-13 ENCOUNTER — Ambulatory Visit: Payer: Medicare HMO | Admitting: Physical Therapy

## 2022-09-13 ENCOUNTER — Encounter: Payer: Self-pay | Admitting: Physical Therapy

## 2022-09-13 DIAGNOSIS — M6281 Muscle weakness (generalized): Secondary | ICD-10-CM

## 2022-09-13 DIAGNOSIS — R2681 Unsteadiness on feet: Secondary | ICD-10-CM

## 2022-09-13 DIAGNOSIS — Z7409 Other reduced mobility: Secondary | ICD-10-CM

## 2022-09-13 NOTE — Therapy (Addendum)
OUTPATIENT PHYSICAL THERAPY TREATMENT   Patient Name: [name] MRN: [id number] DOB:Mar 27, 1945, 77 y.o., male Today's Date: 09/13/2022  END OF SESSION:  PT End of Session - 09/13/22 1103     Visit Number 3    Date for PT Re-Evaluation 09/27/22    Authorization Type [employer] MCR    Progress Note Due on Visit 10    PT Start Time 1103    PT Stop Time 1145    PT Time Calculation (min) 42 min    Activity Tolerance Patient tolerated treatment well    Behavior During Therapy [facility] for tasks assessed/performed              Past Medical History:  Diagnosis Date   Actinic keratosis 02/14/2011   Allergy    Aneurysm of ascending aorta without rupture (HCC) 01/06/2022   Aortic stenosis    Arthritis    Bilateral sensorineural hearing loss 06/16/2009   CAD (coronary artery disease)    cabg   Complication of anesthesia    Coronary atherosclerosis of native coronary artery 06/15/2009   Diabetic polyneuropathy 11/11/2018   Dyslipidemia 11/05/2018   Embolic stroke involving left middle cerebral artery  05/30/2021   Erectile dysfunction 05/18/2009   Essential hypertension 06/16/2009   Expressive aphasia 06/25/2021   External hemorrhoids 03/21/2010   Gout 04/09/2015   Heart murmur    Hemochromatosis 02/14/2011   History of aortic valve replacement 10/13/2009   History of CABG 11/05/2018   2005- 1. Coronary artery bypass grafting x4 with LIMA-LAD, SVG-OM1, SVG-AM and dCFX.  2. Aortic valve replacement with [employer] AVR 3. Septal myomectomy.  4. Myoview low risk 2016   History of hepatitis 01/31/1972   Hypertriglyceridemia 06/13/2010   Hypertrophic obstructive cardiomyopathy 05/06/2008   SP septal myomectomy at surgery 2005 Echo Nov 2018- Hyperdynamic LVEF with severe basal septal hypertrophy. There is chordal SAM with resting gradient of 16 mmHg that increases to 85 mmHg with Valsalva        Leg weakness, bilateral 01/18/2021   Long term (current) use of anticoagulants 05/22/2018    Lumbar stenosis with neurogenic claudication 02/04/2021   Malignant neoplasm of prostate 06/17/2009   Mechanical heart valve present    Manufacturer: [employer] Duty #: [id number]  Model #: [id number]. Card states MRI compatible with 3 teslas or less.   Obesity, unspecified 04/29/2008   OSA (obstructive sleep apnea) 07/12/2012   moderate-severe; uses CPAP nightly   PONV (postoperative nausea and vomiting)    after CABG- slow to wake up   Presence of prosthetic heart valve    Primary osteoarthritis of left shoulder 10/31/2018   S/P lumbar laminectomy 02/04/2021   Stage 3 chronic kidney disease due to type 2 diabetes mellitus 11/11/2018   Tobacco use 06/16/2009   Type 2 diabetes mellitus with hyperglycemia    Ventral hernia 03/03/2014   Past Surgical History:  Procedure Laterality Date   AORTIC VALVE REPLACEMENT  11/14/2003   [facility]   APPENDECTOMY  1990   CHOLECYSTECTOMY  1990   CORONARY ANGIOGRAPHY  10/11/2021   CORONARY ARTERY BYPASS GRAFT  10/2003   HERNIA REPAIR  1999   right, inguinal   HERNIA REPAIR  2002   left, inguinal   HIP SURGERY  2006   right hip   IR CT HEAD LTD  02/03/2022   IR PERCUTANEOUS ART THROMBECTOMY/INFUSION INTRACRANIAL INC DIAG ANGIO  01/24/2022   LUMBAR LAMINECTOMY/DECOMPRESSION MICRODISCECTOMY Bilateral 02/04/2021   Procedure: Bilateral Lumbar Two-Three Laminectomy;  Surgeon: [name], MD;  Location: [facility] OR;  Service: Neurosurgery;  Laterality: Bilateral;  3C/RM 20   PILONIDAL CYST EXCISION  1964   prostate seed implant  03/2010   RADIOLOGY WITH ANESTHESIA N/A 01/24/2022   Procedure: IR WITH ANESTHESIA;  Surgeon: Radiologist, Medication, MD;  Location: [facility] OR;  Service: Radiology;  Laterality: N/A;   TEE WITHOUT CARDIOVERSION N/A 03/14/2022   Procedure: TRANSESOPHAGEAL ECHOCARDIOGRAM (TEE);  Surgeon: Parke Poisson, MD;  Location: [facility] ENDOSCOPY;  Service: Cardiology;  Laterality: N/A;   TONSILLECTOMY  childhood   Patient Active Problem  List   Diagnosis Date Noted   Hypertrophic cardiomyopathy (HCC) 08/12/2022   Chronic anticoagulation 08/12/2022   Hypotension 04/27/2022   H/O: CVA (cerebrovascular accident) 03/14/2022   Urinary retention 01/26/2022   Acute ischemic stroke (HCC) 01/24/2022   Middle cerebral artery embolism, left 01/24/2022   Aneurysm of ascending aorta without rupture (HCC) 01/06/2022   Type 2 diabetes mellitus with stage 3a chronic kidney disease, with long-term current use of insulin (HCC) 10/30/2021   Diabetes mellitus (HCC) 10/30/2021   Type 2 diabetes mellitus with diabetic polyneuropathy, with long-term current use of insulin (HCC) 10/30/2021   NSTEMI (non-ST elevated myocardial infarction) (HCC) 10/09/2021   Paroxysmal atrial fibrillation (HCC) 10/09/2021   Presence of prosthetic heart valve    Type 2 diabetes mellitus with hyperglycemia    Expressive aphasia 06/25/2021   CVA (cerebral vascular accident) (HCC) 05/2021   S/P lumbar laminectomy 02/04/2021   Lumbar stenosis with neurogenic claudication 02/04/2021   History of falling 01/30/2021   Diabetic polyneuropathy 11/11/2018   Stage 3 chronic kidney disease due to type 2 diabetes mellitus 11/11/2018   History of CABG 11/05/2018   Dyslipidemia 11/05/2018   Primary osteoarthritis of left shoulder 10/31/2018   Long term (current) use of anticoagulants 05/22/2018   Ventral hernia 03/03/2014   OSA (obstructive sleep apnea) 07/12/2012   Hemochromatosis 02/14/2011   Actinic keratosis 02/14/2011   Hypertriglyceridemia 06/13/2010   External hemorrhoids 03/21/2010   History of aortic valve replacement 10/13/2009   Malignant neoplasm of prostate (HCC) 06/17/2009   Bilateral sensorineural hearing loss 06/16/2009   Essential hypertension 06/16/2009   Coronary atherosclerosis of native coronary artery 06/15/2009   Erectile dysfunction 05/18/2009   Obesity, unspecified 04/29/2008    PCP: Sandford Craze, NP  REFERRING PROVIDER:  Sandford Craze, NP  REFERRING DIAG: [phone] (ICD-10-CM) - H/O: CVA (cerebrovascular accident)  THERAPY DIAG:  Muscle weakness (generalized)  Impaired functional mobility and activity tolerance  Unsteadiness on feet  RATIONALE FOR EVALUATION AND TREATMENT: Rehabilitation  ONSET DATE: CVA in December 2023  NEXT MD VISIT: 11/13/22   SUBJECTIVE:   SUBJECTIVE STATEMENT: Pt reports he is tired from already working out at [employer] this morning.  He reports selective/modified performance of HEP - isometrics performed directly against furniture as ball not available, and none of toe exercises attempted.  EVAL: Had a stroke in December and it affected my right side. I do have some residual things going on- tremor when I'm trying to use my right hand, working on residual effects from bladder, and I'd like PT to work on my right foot "It feels like its waking up". Having a hard time feeling brake and gas pedals with my right foot. My balance is good. Have some cracking from my knee sometimes from OA. Feel like strength could be an issue, when the foot is waking up there is some weakness. Have been trying to wake it up in the gym. They  think that my INR levels are causing the strokes, working on getting it in an exact range to prevent more CVAs. I will use my walker if I'm going far especially in the heat.   PERTINENT HISTORY: Aneurysm of ascending aorta, aortic stenosis, B sensorineural hearing loss - extremely HOH, CAD s/p CABG, cardiomyopathy, AVR, chronic anticoagulation, DM-II with neuropathy, CVA of MCA, HTN, expressive aphasia, gout, lumbar stenosis s/p lumbar laminectomy and decompression 2023, shoulder OA, CKD, ventral hernia, hernia repair, hip surgery, hx multiple strokes (3-4), prostate cancer  PAIN:  Are you having pain? No 0/10, "don't really have pain"   PRECAUTIONS: None  WEIGHT BEARING RESTRICTIONS: No  FALLS:  Has patient fallen in last 6 months? No  LIVING  ENVIRONMENT: Lives with: lives with their family Lives in: House/apartment Stairs: No Has following equipment at home: Environmental consultant - 2 wheeled, Crutches, and Grab bars  OCCUPATION: retired   PLOF: Independent, Independent with basic ADLs, Independent with gait, and Independent with transfers  PATIENT GOALS: "get my right leg to wake up "     OBJECTIVE:   DIAGNOSTIC FINDINGS:  04/09/22 - MRI Brain IMPRESSION: 1. Positive for a tiny acute cortical infarct of the left frontal operculum, within an area of chronic Left MCA middle division infarct with encephalomalacia and laminar necrosis. No acute hemorrhage or mass effect. 2. Conspicuous increased intrinsic T1 signal in the left lentiform nuclei since December. Nonspecific, but favor this is a form of Wallerian degeneration secondary to the chronic ischemia in #1. 3. Elsewhere stable noncontrast MRI appearance of the brain since last year., including chronic bilateral cerebellar infarcts and occasional punctate chronic microhemorrhages.  04/09/22 - CT Head: IMPRESSION: 1. No acute intracranial abnormality. 2. Chronic left MCA and cerebellar infarcts.  PATIENT SURVEYS:  ABC scale 90.9%  COGNITION: Overall cognitive status: Within functional limits for tasks assessed     SENSATION: "Feels like R LE is still waking up"   LOWER EXTREMITY MMT:  MMT Right eval Left eval R 09/13/22 L 09/13/22  Hip flexion 4- 4+ 4- 4+  Hip extension   3+ 4  Hip abduction 4+ 5 4- 4  Hip adduction   4- 4  Hip internal rotation   3+ 5  Hip external rotation   3+ 4  Knee flexion 4 4    Knee extension 4+ 4+    Ankle dorsiflexion 5 5    Ankle plantarflexion      Ankle inversion      Ankle eversion       (Blank rows = not tested)  FUNCTIONAL TESTS:  3 minute walk test: 505 ft no device, mild unsteadiness on turns, moderate SOB by end of gait period   Able to hold SLS R LE 30 seconds, tandem stance with R foot in the back x30  seconds  GAIT: Distance walked: in clinic distances  Assistive device utilized: None Level of assistance: Complete Independence Comments: varus knees at baseline, mild unsteadiness on turns    TODAY'S TREATMENT:  DATE:   09/12/25 THERAPEUTIC EXERCISE: to improve flexibility, strength and mobility.  Demonstration, verbal and tactile cues throughout for technique. Seated YTB R ankle inversion, eversion and DF 3 x 10, 1st set with PT stabilizing YTB, latter sets with self stabilization B heel raises with R eccentric heel lowering on level ground 2 x 10 B heel raises with R eccentric heel lowering with toes on 2" block and heels on ground 2 x 10 B heel raises with R eccentric heel lowering over edge of step 2 x 10  THERAPEUTIC EXERCISE: to improve flexibility, strength and mobility.  Demonstration, verbal and tactile cues throughout for technique.  Proximal LE MMT   09/05/25 THERAPEUTIC EXERCISE: to improve flexibility, strength and mobility.  Demonstration, verbal and tactile cues throughout for technique.  NuStep - L5 x 6 min R eccentric heel lowering on level ground x 10 R eccentric heel lowering on step 2 x 10 R eccentric single heel raises with leg press demonstrated but not attempted due to fatigue from prior exercises R isometric ankle inversion into ball against stationary L foot 10 x 5" R isometric ankle inversion into ball against stationary PT's foot 10 x 5" (demonstrated using wall for home performance) R seated toe extension x 10 - very limited ROM R seated toe curl x 10 - very limited ROM R towel scrunches - unable (instructions provided for HEP to work on initiating ROM) R toe spreading - unable (instructions provided for HEP to work on initiating ROM)   08/16/22 - Eval Objective measures, care planning, education as appropriate  TherEx Ankle DF +  3 second hold x 10  B Bridges with RLE staggered closer to glutes x10 STS with RLE staggered back x10      PATIENT EDUCATION:  Education details: HEP progression - R foot and ankle strengthening    Person educated: Patient Education method: Explanation, Demonstration, and Handouts Education comprehension: verbalized understanding, returned demonstration, and needs further education  HOME EXERCISE PROGRAM: Access Code: [email or web address] URL: [email or web address] Date: 09/13/2022 Prepared by: Glenetta Hew  Exercises - Seated Ankle Dorsiflexion Stretch  - 2 x daily - 7 x weekly - 1 sets - 10 reps - 5 seconds  hold - Supine Bridge  - 2 x daily - 7 x weekly - 1 sets - 10 reps - 2 seconds  hold - Sit to Stand with Arms Crossed  - 1 x daily - 7 x weekly - 1 sets - 6-10 reps - Eccentric Heel Lowering on Step (Mirrored)  - 1 x daily - 7 x weekly - 2 sets - 10 reps - 3-5 sec hold - Eccentric Single Heel Raises with Leg Press  2 Up, 1 Down  (Mirrored)  - 1 x daily - 7 x weekly - 2 sets - 10 reps - 3 sec hold - Isometric Ankle Inversion at Wall  - 1 x daily - 7 x weekly - 2 sets - 10 reps - 3-5 sec hold - Isometric Ankle Eversion at Wall  - 1 x daily - 7 x weekly - 2 sets - 10 reps - 3-5 sec hold - Seated Toe Curl  - 1 x daily - 7 x weekly - 2 sets - 10 reps - 3 sec hold - Towel Scrunches  - 2-3 x daily - 7 x weekly - 2 sets - 15 reps - Seated Lesser Toes Extension  - 1 x daily - 7 x weekly - 2 sets - 10 reps - 3 sec hold - Toe  Spreading  - 1 x daily - 7 x weekly - 2 sets - 10 reps - 3 sec hold - Seated Ankle Dorsiflexion with Resistance  - 1 x daily - 7 x weekly - 2 sets - 10 reps - 3 sec hold - Ankle Inversion with Anchored Resistance at Table  - 1 x daily - 7 x weekly - 2 sets - 10 reps - 3 sec hold - Seated Ankle Eversion with Resistance  - 1 x daily - 7 x weekly - 2 sets - 10 reps - 3 sec hold   ASSESSMENT:  CLINICAL IMPRESSION: Carbon "[name]" reports selective compliance with  updated HEP provided last visit.  He has been performing isometrics against a solid surface of he did not have a ball available, although he states his wife is purchasing one for him for future use.  Repeat instruction necessary for eccentric heel raises as he was performing these as more of an isometric exercise - he reports better understanding and able to perform good return demonstration after review.  He admits to not attempting any of the toe exercises, stating "I just can't do them" - reinforced need for attempt to better encourage muscle activation, utilizing improvement noted in inversion/eversion from performance of isometrics at home.  Advanced ankle strengthening to incorporate YTB dynamic resistance, with cues necessary to isolate movement at ankle and maintain stable hip and knee.  Provided education on need to assess and address proximal LE weakness for improved stability to decrease workload distally.  MMT revealing mild to moderate R>L hip weakness which we will address next visit. [name] will benefit from skilled PT to address above deficits to improve mobility and activity tolerance with improved R foot/ankle proprioception for decreased fall risk.   OBJECTIVE IMPAIRMENTS: Abnormal gait, decreased activity tolerance, decreased balance, difficulty walking, decreased strength, and obesity.   ACTIVITY LIMITATIONS: locomotion level  PARTICIPATION LIMITATIONS: driving, shopping, and community activity  PERSONAL FACTORS: Age, Education, Fitness, Past/current experiences, Social background, and Time since onset of injury/illness/exacerbation are also affecting patient's functional outcome.   REHAB POTENTIAL: Good  CLINICAL DECISION MAKING: Evolving/moderate complexity  EVALUATION COMPLEXITY: Moderate   GOALS: Goals reviewed with patient? Yes  SHORT TERM GOALS: Target date: 09/27/2022    Will be compliant with appropriate progressive HEP  Baseline: Goal status: IN PROGRESS  09/13/22 -  patient selectively compliant with review of some exercises needed for proper technique  2.  MMT to be 5/5 globally  Baseline:  Goal status: IN PROGRESS  3.  Will report 50% improvement in proprioception and 50% improvement in feelings of R LE "being awake"  Baseline:  Goal status: IN PROGRESS  4.  ABC score to improve by 5 points to show subjective progress  Baseline: 90.9% Goal status: IN PROGRESS  5.  Will ambulate 646ft in with minimal SOB  Baseline:  Goal status: IN PROGRESS  LONG TERM GOALS: Target date: not appropriate at this time     PLAN:  PT FREQUENCY: 1x/week  PT DURATION: 4 weeks  PLANNED INTERVENTIONS: Therapeutic exercises, Therapeutic activity, Neuromuscular re-education, Balance training, Gait training, Patient/Family education, Self Care, Stair training, Manual therapy, and Re-evaluation  PLAN FOR NEXT SESSION: Progress R foot and ankle strengthening and proprioceptive/balance training; proximal LE strengthening; weekly HEP updates he can use in the gym    Marry Guan, PT  09/13/22 2:52 PM

## 2022-09-14 DIAGNOSIS — R338 Other retention of urine: Secondary | ICD-10-CM | POA: Diagnosis not present

## 2022-09-14 DIAGNOSIS — C61 Malignant neoplasm of prostate: Secondary | ICD-10-CM | POA: Diagnosis not present

## 2022-09-14 DIAGNOSIS — N3000 Acute cystitis without hematuria: Secondary | ICD-10-CM | POA: Diagnosis not present

## 2022-09-18 DIAGNOSIS — E119 Type 2 diabetes mellitus without complications: Secondary | ICD-10-CM | POA: Diagnosis not present

## 2022-09-20 ENCOUNTER — Ambulatory Visit: Payer: Medicare HMO | Admitting: Physical Therapy

## 2022-09-20 ENCOUNTER — Encounter: Payer: Self-pay | Admitting: Physical Therapy

## 2022-09-20 DIAGNOSIS — R2681 Unsteadiness on feet: Secondary | ICD-10-CM | POA: Diagnosis not present

## 2022-09-20 DIAGNOSIS — Z7409 Other reduced mobility: Secondary | ICD-10-CM

## 2022-09-20 DIAGNOSIS — M6281 Muscle weakness (generalized): Secondary | ICD-10-CM | POA: Diagnosis not present

## 2022-09-20 NOTE — Therapy (Signed)
OUTPATIENT PHYSICAL THERAPY TREATMENT   Patient Name: [name] MRN: [id number] DOB:16-Aug-1945, 77 y.o., male Today's Date: 09/20/2022  END OF SESSION:  PT End of Session - 09/20/22 1104     Visit Number 4    Date for PT Re-Evaluation 09/27/22    Authorization Type [employer] MCR    Progress Note Due on Visit 10    PT Start Time 1104    PT Stop Time 1148    PT Time Calculation (min) 44 min    Activity Tolerance Patient tolerated treatment well    Behavior During Therapy [facility] for tasks assessed/performed               Past Medical History:  Diagnosis Date   Actinic keratosis 02/14/2011   Allergy    Aneurysm of ascending aorta without rupture (HCC) 01/06/2022   Aortic stenosis    Arthritis    Bilateral sensorineural hearing loss 06/16/2009   CAD (coronary artery disease)    cabg   Complication of anesthesia    Coronary atherosclerosis of native coronary artery 06/15/2009   Diabetic polyneuropathy 11/11/2018   Dyslipidemia 11/05/2018   Embolic stroke involving left middle cerebral artery  05/30/2021   Erectile dysfunction 05/18/2009   Essential hypertension 06/16/2009   Expressive aphasia 06/25/2021   External hemorrhoids 03/21/2010   Gout 04/09/2015   Heart murmur    Hemochromatosis 02/14/2011   History of aortic valve replacement 10/13/2009   History of CABG 11/05/2018   2005- 1. Coronary artery bypass grafting x4 with LIMA-LAD, SVG-OM1, SVG-AM and dCFX.  2. Aortic valve replacement with [employer] AVR 3. Septal myomectomy.  4. Myoview low risk 2016   History of hepatitis 01/31/1972   Hypertriglyceridemia 06/13/2010   Hypertrophic obstructive cardiomyopathy 05/06/2008   SP septal myomectomy at surgery 2005 Echo Nov 2018- Hyperdynamic LVEF with severe basal septal hypertrophy. There is chordal SAM with resting gradient of 16 mmHg that increases to 85 mmHg with Valsalva        Leg weakness, bilateral 01/18/2021   Long term (current) use of anticoagulants  05/22/2018   Lumbar stenosis with neurogenic claudication 02/04/2021   Malignant neoplasm of prostate 06/17/2009   Mechanical heart valve present    Manufacturer: [employer] Duty #: [id number]  Model #: [id number]. Card states MRI compatible with 3 teslas or less.   Obesity, unspecified 04/29/2008   OSA (obstructive sleep apnea) 07/12/2012   moderate-severe; uses CPAP nightly   PONV (postoperative nausea and vomiting)    after CABG- slow to wake up   Presence of prosthetic heart valve    Primary osteoarthritis of left shoulder 10/31/2018   S/P lumbar laminectomy 02/04/2021   Stage 3 chronic kidney disease due to type 2 diabetes mellitus 11/11/2018   Tobacco use 06/16/2009   Type 2 diabetes mellitus with hyperglycemia    Ventral hernia 03/03/2014   Past Surgical History:  Procedure Laterality Date   AORTIC VALVE REPLACEMENT  11/14/2003   [facility]   APPENDECTOMY  1990   CHOLECYSTECTOMY  1990   CORONARY ANGIOGRAPHY  10/11/2021   CORONARY ARTERY BYPASS GRAFT  10/2003   HERNIA REPAIR  1999   right, inguinal   HERNIA REPAIR  2002   left, inguinal   HIP SURGERY  2006   right hip   IR CT HEAD LTD  02/03/2022   IR PERCUTANEOUS ART THROMBECTOMY/INFUSION INTRACRANIAL INC DIAG ANGIO  01/24/2022   LUMBAR LAMINECTOMY/DECOMPRESSION MICRODISCECTOMY Bilateral 02/04/2021   Procedure: Bilateral Lumbar Two-Three Laminectomy;  Surgeon: [name], MD;  Location: [facility];  Service: Neurosurgery;  Laterality: Bilateral;  3C/RM 20   PILONIDAL CYST EXCISION  1964   prostate seed implant  03/2010   RADIOLOGY WITH ANESTHESIA N/A 01/24/2022   Procedure: IR WITH ANESTHESIA;  Surgeon: Radiologist, Medication, MD;  Location: [facility] OR;  Service: Radiology;  Laterality: N/A;   TEE WITHOUT CARDIOVERSION N/A 03/14/2022   Procedure: TRANSESOPHAGEAL ECHOCARDIOGRAM (TEE);  Surgeon: Parke Poisson, MD;  Location: [facility] ENDOSCOPY;  Service: Cardiology;  Laterality: N/A;   TONSILLECTOMY  childhood   Patient  Active Problem List   Diagnosis Date Noted   Hypertrophic cardiomyopathy (HCC) 08/12/2022   Chronic anticoagulation 08/12/2022   Hypotension 04/27/2022   H/O: CVA (cerebrovascular accident) 03/14/2022   Urinary retention 01/26/2022   Acute ischemic stroke (HCC) 01/24/2022   Middle cerebral artery embolism, left 01/24/2022   Aneurysm of ascending aorta without rupture (HCC) 01/06/2022   Type 2 diabetes mellitus with stage 3a chronic kidney disease, with long-term current use of insulin (HCC) 10/30/2021   Diabetes mellitus (HCC) 10/30/2021   Type 2 diabetes mellitus with diabetic polyneuropathy, with long-term current use of insulin (HCC) 10/30/2021   NSTEMI (non-ST elevated myocardial infarction) (HCC) 10/09/2021   Paroxysmal atrial fibrillation (HCC) 10/09/2021   Presence of prosthetic heart valve    Type 2 diabetes mellitus with hyperglycemia    Expressive aphasia 06/25/2021   CVA (cerebral vascular accident) (HCC) 05/2021   S/P lumbar laminectomy 02/04/2021   Lumbar stenosis with neurogenic claudication 02/04/2021   History of falling 01/30/2021   Diabetic polyneuropathy 11/11/2018   Stage 3 chronic kidney disease due to type 2 diabetes mellitus 11/11/2018   History of CABG 11/05/2018   Dyslipidemia 11/05/2018   Primary osteoarthritis of left shoulder 10/31/2018   Long term (current) use of anticoagulants 05/22/2018   Ventral hernia 03/03/2014   OSA (obstructive sleep apnea) 07/12/2012   Hemochromatosis 02/14/2011   Actinic keratosis 02/14/2011   Hypertriglyceridemia 06/13/2010   External hemorrhoids 03/21/2010   History of aortic valve replacement 10/13/2009   Malignant neoplasm of prostate (HCC) 06/17/2009   Bilateral sensorineural hearing loss 06/16/2009   Essential hypertension 06/16/2009   Coronary atherosclerosis of native coronary artery 06/15/2009   Erectile dysfunction 05/18/2009   Obesity, unspecified 04/29/2008    PCP: Sandford Craze, NP  REFERRING  PROVIDER: Sandford Craze, NP  REFERRING DIAG: [phone] (ICD-10-CM) - H/O: CVA (cerebrovascular accident)  THERAPY DIAG:  Muscle weakness (generalized)  Impaired functional mobility and activity tolerance  Unsteadiness on feet  RATIONALE FOR EVALUATION AND TREATMENT: Rehabilitation  ONSET DATE: CVA in December 2023  NEXT MD VISIT: 11/13/22   SUBJECTIVE:   SUBJECTIVE STATEMENT: Pt reports lately he has been waking up and feels like the neuropathy is extending higher up his legs.  EVAL: Had a stroke in December and it affected my right side. I do have some residual things going on- tremor when I'm trying to use my right hand, working on residual effects from bladder, and I'd like PT to work on my right foot "It feels like its waking up". Having a hard time feeling brake and gas pedals with my right foot. My balance is good. Have some cracking from my knee sometimes from OA. Feel like strength could be an issue, when the foot is waking up there is some weakness. Have been trying to wake it up in the gym. They think that my INR levels are causing the strokes, working on getting it in an exact range to  prevent more CVAs. I will use my walker if I'm going far especially in the heat.   PERTINENT HISTORY: Aneurysm of ascending aorta, aortic stenosis, B sensorineural hearing loss - extremely HOH, CAD s/p CABG, cardiomyopathy, AVR, chronic anticoagulation, DM-II with neuropathy, CVA of MCA, HTN, expressive aphasia, gout, lumbar stenosis s/p lumbar laminectomy and decompression 2023, shoulder OA, CKD, ventral hernia, hernia repair, hip surgery, hx multiple strokes (3-4), prostate cancer  PAIN:  Are you having pain? No 0/10, "don't really have pain"   PRECAUTIONS: None  WEIGHT BEARING RESTRICTIONS: No  FALLS:  Has patient fallen in last 6 months? No  LIVING ENVIRONMENT: Lives with: lives with their family Lives in: House/apartment Stairs: No Has following equipment at home: Environmental consultant - 2  wheeled, Crutches, and Grab bars  OCCUPATION: retired   PLOF: Independent, Independent with basic ADLs, Independent with gait, and Independent with transfers  PATIENT GOALS: "get my right leg to wake up "     OBJECTIVE:   DIAGNOSTIC FINDINGS:  04/09/22 - MRI Brain IMPRESSION: 1. Positive for a tiny acute cortical infarct of the left frontal operculum, within an area of chronic Left MCA middle division infarct with encephalomalacia and laminar necrosis. No acute hemorrhage or mass effect. 2. Conspicuous increased intrinsic T1 signal in the left lentiform nuclei since December. Nonspecific, but favor this is a form of Wallerian degeneration secondary to the chronic ischemia in #1. 3. Elsewhere stable noncontrast MRI appearance of the brain since last year., including chronic bilateral cerebellar infarcts and occasional punctate chronic microhemorrhages.  04/09/22 - CT Head: IMPRESSION: 1. No acute intracranial abnormality. 2. Chronic left MCA and cerebellar infarcts.  PATIENT SURVEYS:  ABC scale 90.9%  COGNITION: Overall cognitive status: Within functional limits for tasks assessed     SENSATION: "Feels like R LE is still waking up"   LOWER EXTREMITY MMT:  MMT Right eval Left eval R 09/13/22 L 09/13/22  Hip flexion 4- 4+ 4- 4+  Hip extension   3+ 4  Hip abduction 4+ 5 4- 4  Hip adduction   4- 4  Hip internal rotation   3+ 5  Hip external rotation   3+ 4  Knee flexion 4 4    Knee extension 4+ 4+    Ankle dorsiflexion 5 5    Ankle plantarflexion      Ankle inversion      Ankle eversion       (Blank rows = not tested)  FUNCTIONAL TESTS:  3 minute walk test: 505 ft no device, mild unsteadiness on turns, moderate SOB by end of gait period   Able to hold SLS R LE 30 seconds, tandem stance with R foot in the back x30 seconds  GAIT: Distance walked: in clinic distances  Assistive device utilized: None Level of assistance: Complete Independence Comments: varus knees  at baseline, mild unsteadiness on turns    TODAY'S TREATMENT:  DATE:   09/20/22 THERAPEUTIC EXERCISE: to improve flexibility, strength and mobility.  Demonstration, verbal and tactile cues throughout for technique. BATCA ankle press 20# x 10 BATCA leg press 20# x 10 BATCA knee flexion/HS curls 25# B con/R ecc 2 x 10 BATCA knee extension 10# B con/R ecc 2 x 10 Standing R/L hip ABD with looped RTB at ankles x 10, UE support on back of chair for balance Standing R/L hip extension with looped RTB at ankles x 10, UE support on back of chair for balance Standing R/L hip flexion march with looped RTB at midfeet x 10, UE support on back of chair for balance S/L R clam 2 x 10, 1st set w/o resistance, 2nd set with looped RTB at distal thigh Seated R ankle eversion & DF with looped RTB x 10   09/12/25 THERAPEUTIC EXERCISE: to improve flexibility, strength and mobility.  Demonstration, verbal and tactile cues throughout for technique. Seated YTB R ankle inversion, eversion and DF 3 x 10, 1st set with PT stabilizing YTB, latter sets with self stabilization B heel raises with R eccentric heel lowering on level ground 2 x 10 B heel raises with R eccentric heel lowering with toes on 2" block and heels on ground 2 x 10 B heel raises with R eccentric heel lowering over edge of step 2 x 10  THERAPEUTIC EXERCISE: to improve flexibility, strength and mobility.  Demonstration, verbal and tactile cues throughout for technique.  Proximal LE MMT   09/05/25 THERAPEUTIC EXERCISE: to improve flexibility, strength and mobility.  Demonstration, verbal and tactile cues throughout for technique.  NuStep - L5 x 6 min R eccentric heel lowering on level ground x 10 R eccentric heel lowering on step 2 x 10 R eccentric single heel raises with leg press demonstrated but not attempted due to fatigue  from prior exercises R isometric ankle inversion into ball against stationary L foot 10 x 5" R isometric ankle inversion into ball against stationary PT's foot 10 x 5" (demonstrated using wall for home performance) R seated toe extension x 10 - very limited ROM R seated toe curl x 10 - very limited ROM R towel scrunches - unable (instructions provided for HEP to work on initiating ROM) R toe spreading - unable (instructions provided for HEP to work on initiating ROM)   08/16/22 - Eval Objective measures, care planning, education as appropriate  TherEx Ankle DF + 3 second hold x 10  B Bridges with RLE staggered closer to glutes x10 STS with RLE staggered back x10      PATIENT EDUCATION:  Education details: HEP progression - R foot and ankle strengthening    Person educated: Patient Education method: Explanation, Demonstration, and Handouts Education comprehension: verbalized understanding, returned demonstration, and needs further education  HOME EXERCISE PROGRAM: Access Code: [email or web address] URL: [email or web address] Date: 09/20/2022 Prepared by: Glenetta Hew  Exercises - Seated Ankle Dorsiflexion Stretch  - 2 x daily - 7 x weekly - 1 sets - 10 reps - 5 seconds  hold - Supine Bridge  - 2 x daily - 7 x weekly - 1 sets - 10 reps - 2 seconds  hold - Eccentric Heel Lowering on Step (Mirrored)  - 1 x daily - 7 x weekly - 2 sets - 10 reps - 3-5 sec hold - Eccentric Single Heel Raises with Leg Press  2 Up, 1 Down  (Mirrored)  - 1 x daily - 7 x weekly - 2 sets - 10 reps - 3 sec  hold - Isometric Ankle Inversion at Wall  - 1 x daily - 7 x weekly - 2 sets - 10 reps - 3-5 sec hold - Isometric Ankle Eversion at Wall  - 1 x daily - 7 x weekly - 2 sets - 10 reps - 3-5 sec hold - Seated Toe Curl  - 1 x daily - 7 x weekly - 2 sets - 10 reps - 3 sec hold - Towel Scrunches  - 2-3 x daily - 7 x weekly - 2 sets - 15 reps - Seated Lesser Toes Extension  - 1 x daily - 7 x weekly - 2 sets - 10  reps - 3 sec hold - Toe Spreading  - 1 x daily - 7 x weekly - 2 sets - 10 reps - 3 sec hold - Seated Ankle Dorsiflexion with Resistance  - 1 x daily - 7 x weekly - 2 sets - 10 reps - 3 sec hold - Ankle Inversion with Anchored Resistance at Table  - 1 x daily - 7 x weekly - 2 sets - 10 reps - 3 sec hold - Seated Ankle Eversion with Resistance  - 1 x daily - 7 x weekly - 2 sets - 10 reps - 3 sec hold - Clam with Resistance  - 1 x daily - 3 x weekly - 2 sets - 10 reps - 3-5 sec hold - Standing Hip Abduction with Resistance at Ankles and Counter Support  - 1 x daily - 3 x weekly - 2 sets - 10 reps - 3 sec hold - Standing Hip Extension with Resistance at Ankles and Counter Support  - 1 x daily - 3 x weekly - 2 sets - 10 reps - 3 sec hold - Marching with Resistance  - 1 x daily - 3 x weekly - 2 sets - 10 reps - 3 sec hold - Sit to Stand with Resistance Around Legs  - 1 x daily - 3 x weekly - 2 sets - 10 reps   ASSESSMENT:  CLINICAL IMPRESSION: Harlie "Len" requesting clarification of use of leg press machine for leg press versus ankle press at the gym, therefore demonstrated both exercises with patient able to perform return demonstration.  Also provided instruction in use of knee flexion and extension weight machines with emphasis on eccentric strengthening to address R LE strength deficits, along with instruction in Thera-Band resisted hip strengthening with description of gym weight machines that would allow for similar strengthening.  He requested reprint of all HEP exercises stating he has misplaced but all but the most recent handouts, therefore provided instruction in progression of earlier exercises.  We started review of band resisted ankle strengthening initiated last visit but will need further review of this next visit.  Len is due for goal assessment next visit to determine need for possible recert versus discharge to HEP.   OBJECTIVE IMPAIRMENTS: Abnormal gait, decreased activity tolerance,  decreased balance, difficulty walking, decreased strength, and obesity.   ACTIVITY LIMITATIONS: locomotion level  PARTICIPATION LIMITATIONS: driving, shopping, and community activity  PERSONAL FACTORS: Age, Education, Fitness, Past/current experiences, Social background, and Time since onset of injury/illness/exacerbation are also affecting patient's functional outcome.   REHAB POTENTIAL: Good  CLINICAL DECISION MAKING: Evolving/moderate complexity  EVALUATION COMPLEXITY: Moderate   GOALS: Goals reviewed with patient? Yes  SHORT TERM GOALS: Target date: 09/27/2022    Will be compliant with appropriate progressive HEP  Baseline: Goal status: IN PROGRESS  09/20/22 - patient selectively compliant with HEP,  continued review of some exercises needed for proper technique  2.  MMT to be 5/5 globally  Baseline:  Goal status: IN PROGRESS  3.  Will report 50% improvement in proprioception and 50% improvement in feelings of R LE "being awake"  Baseline:  Goal status: IN PROGRESS  4.  ABC score to improve by 5 points to show subjective progress  Baseline: 90.9% Goal status: IN PROGRESS  5.  Will ambulate 655ft in with minimal SOB  Baseline:  Goal status: IN PROGRESS  LONG TERM GOALS: Target date: not appropriate at this time     PLAN:  PT FREQUENCY: 1x/week  PT DURATION: 4 weeks  PLANNED INTERVENTIONS: Therapeutic exercises, Therapeutic activity, Neuromuscular re-education, Balance training, Gait training, Patient/Family education, Self Care, Stair training, Manual therapy, and Re-evaluation  PLAN FOR NEXT SESSION: Goal assessment to determine readiness for transition to HEP DC versus need for recert to continue to progress R foot and ankle strengthening and proprioceptive/balance training; proximal LE strengthening; weekly HEP updates he can use in the gym    Marry Guan, PT  09/20/22 11:51 AM

## 2022-09-21 ENCOUNTER — Ambulatory Visit: Payer: Medicare HMO | Attending: Cardiovascular Disease | Admitting: *Deleted

## 2022-09-21 DIAGNOSIS — Z5181 Encounter for therapeutic drug level monitoring: Secondary | ICD-10-CM | POA: Diagnosis not present

## 2022-09-21 DIAGNOSIS — G4733 Obstructive sleep apnea (adult) (pediatric): Secondary | ICD-10-CM | POA: Diagnosis not present

## 2022-09-21 DIAGNOSIS — Z952 Presence of prosthetic heart valve: Secondary | ICD-10-CM | POA: Diagnosis not present

## 2022-09-21 DIAGNOSIS — I10 Essential (primary) hypertension: Secondary | ICD-10-CM | POA: Diagnosis not present

## 2022-09-21 LAB — POCT INR: POC INR: 2.9

## 2022-09-21 NOTE — Patient Instructions (Addendum)
Description   Take 1.5 tablets of warfarin today and then continue taking 1 tablet daily except 1/2 tablet on Mondays, Wednesdays, and Fridays.  Continue consistency with leafy green intake 3 servings/week.  Recheck INR in 2 weeks.  [facility] [phone].

## 2022-09-22 DIAGNOSIS — N400 Enlarged prostate without lower urinary tract symptoms: Secondary | ICD-10-CM | POA: Diagnosis not present

## 2022-09-22 DIAGNOSIS — R339 Retention of urine, unspecified: Secondary | ICD-10-CM | POA: Diagnosis not present

## 2022-09-25 ENCOUNTER — Ambulatory Visit: Payer: Medicare HMO | Admitting: Physical Therapy

## 2022-09-25 DIAGNOSIS — Z7409 Other reduced mobility: Secondary | ICD-10-CM | POA: Diagnosis not present

## 2022-09-25 DIAGNOSIS — M6281 Muscle weakness (generalized): Secondary | ICD-10-CM

## 2022-09-25 DIAGNOSIS — R2681 Unsteadiness on feet: Secondary | ICD-10-CM | POA: Diagnosis not present

## 2022-09-25 NOTE — Therapy (Addendum)
OUTPATIENT PHYSICAL THERAPY TREATMENT / RECERTIFICATION  Progress Note  Reporting Period 08/16/22 to 09/25/22  See note below for Objective Data and Assessment of Progress/Goals.     Patient Name: [name] MRN: [id number] DOB:1945/06/19, 77 y.o., male Today's Date: 09/25/2022  END OF SESSION:  PT End of Session - 09/25/22 1102     Visit Number 5    Date for PT Re-Evaluation 11/06/22    Authorization Type [employer] MCR    Progress Note Due on Visit 10    PT Start Time 1105    PT Stop Time 1159    PT Time Calculation (min) 54 min    Activity Tolerance Patient tolerated treatment well;Patient limited by fatigue    Behavior During Therapy [facility] for tasks assessed/performed;Impulsive                Past Medical History:  Diagnosis Date   Actinic keratosis 02/14/2011   Allergy    Aneurysm of ascending aorta without rupture (HCC) 01/06/2022   Aortic stenosis    Arthritis    Bilateral sensorineural hearing loss 06/16/2009   CAD (coronary artery disease)    cabg   Complication of anesthesia    Coronary atherosclerosis of native coronary artery 06/15/2009   Diabetic polyneuropathy 11/11/2018   Dyslipidemia 11/05/2018   Embolic stroke involving left middle cerebral artery  05/30/2021   Erectile dysfunction 05/18/2009   Essential hypertension 06/16/2009   Expressive aphasia 06/25/2021   External hemorrhoids 03/21/2010   Gout 04/09/2015   Heart murmur    Hemochromatosis 02/14/2011   History of aortic valve replacement 10/13/2009   History of CABG 11/05/2018   2005- 1. Coronary artery bypass grafting x4 with LIMA-LAD, SVG-OM1, SVG-AM and dCFX.  2. Aortic valve replacement with [employer] Jude AVR 3. Septal myomectomy.  4. Myoview low risk 2016   History of hepatitis 01/31/1972   Hypertriglyceridemia 06/13/2010   Hypertrophic obstructive cardiomyopathy 05/06/2008   SP septal myomectomy at surgery 2005 Echo Nov 2018- Hyperdynamic LVEF with severe basal septal hypertrophy.  There is chordal SAM with resting gradient of 16 mmHg that increases to 85 mmHg with Valsalva        Leg weakness, bilateral 01/18/2021   Long term (current) use of anticoagulants 05/22/2018   Lumbar stenosis with neurogenic claudication 02/04/2021   Malignant neoplasm of prostate 06/17/2009   Mechanical heart valve present    Manufacturer: [employer] Duty #: [id number]  Model #: [id number]. Card states MRI compatible with 3 teslas or less.   Obesity, unspecified 04/29/2008   OSA (obstructive sleep apnea) 07/12/2012   moderate-severe; uses CPAP nightly   PONV (postoperative nausea and vomiting)    after CABG- slow to wake up   Presence of prosthetic heart valve    Primary osteoarthritis of left shoulder 10/31/2018   S/P lumbar laminectomy 02/04/2021   Stage 3 chronic kidney disease due to type 2 diabetes mellitus 11/11/2018   Tobacco use 06/16/2009   Type 2 diabetes mellitus with hyperglycemia    Ventral hernia 03/03/2014   Past Surgical History:  Procedure Laterality Date   AORTIC VALVE REPLACEMENT  11/14/2003   [facility]   APPENDECTOMY  1990   CHOLECYSTECTOMY  1990   CORONARY ANGIOGRAPHY  10/11/2021   CORONARY ARTERY BYPASS GRAFT  10/2003   HERNIA REPAIR  1999   right, inguinal   HERNIA REPAIR  2002   left, inguinal   HIP SURGERY  2006   right hip   IR CT HEAD LTD  02/03/2022   IR PERCUTANEOUS ART THROMBECTOMY/INFUSION INTRACRANIAL INC DIAG ANGIO  01/24/2022   LUMBAR LAMINECTOMY/DECOMPRESSION MICRODISCECTOMY Bilateral 02/04/2021   Procedure: Bilateral Lumbar Two-Three Laminectomy;  Surgeon: [name], MD;  Location: [facility] OR;  Service: Neurosurgery;  Laterality: Bilateral;  3C/RM 20   PILONIDAL CYST EXCISION  1964   prostate seed implant  03/2010   RADIOLOGY WITH ANESTHESIA N/A 01/24/2022   Procedure: IR WITH ANESTHESIA;  Surgeon: Radiologist, Medication, MD;  Location: [facility] OR;  Service: Radiology;  Laterality: N/A;   TEE WITHOUT CARDIOVERSION N/A 03/14/2022    Procedure: TRANSESOPHAGEAL ECHOCARDIOGRAM (TEE);  Surgeon: Parke Poisson, MD;  Location: [facility] ENDOSCOPY;  Service: Cardiology;  Laterality: N/A;   TONSILLECTOMY  childhood   Patient Active Problem List   Diagnosis Date Noted   Hypertrophic cardiomyopathy (HCC) 08/12/2022   Chronic anticoagulation 08/12/2022   Hypotension 04/27/2022   H/O: CVA (cerebrovascular accident) 03/14/2022   Urinary retention 01/26/2022   Acute ischemic stroke (HCC) 01/24/2022   Middle cerebral artery embolism, left 01/24/2022   Aneurysm of ascending aorta without rupture (HCC) 01/06/2022   Type 2 diabetes mellitus with stage 3a chronic kidney disease, with long-term current use of insulin (HCC) 10/30/2021   Diabetes mellitus (HCC) 10/30/2021   Type 2 diabetes mellitus with diabetic polyneuropathy, with long-term current use of insulin (HCC) 10/30/2021   NSTEMI (non-ST elevated myocardial infarction) (HCC) 10/09/2021   Paroxysmal atrial fibrillation (HCC) 10/09/2021   Presence of prosthetic heart valve    Type 2 diabetes mellitus with hyperglycemia    Expressive aphasia 06/25/2021   CVA (cerebral vascular accident) (HCC) 05/2021   S/P lumbar laminectomy 02/04/2021   Lumbar stenosis with neurogenic claudication 02/04/2021   History of falling 01/30/2021   Diabetic polyneuropathy 11/11/2018   Stage 3 chronic kidney disease due to type 2 diabetes mellitus 11/11/2018   History of CABG 11/05/2018   Dyslipidemia 11/05/2018   Primary osteoarthritis of left shoulder 10/31/2018   Long term (current) use of anticoagulants 05/22/2018   Ventral hernia 03/03/2014   OSA (obstructive sleep apnea) 07/12/2012   Hemochromatosis 02/14/2011   Actinic keratosis 02/14/2011   Hypertriglyceridemia 06/13/2010   External hemorrhoids 03/21/2010   History of aortic valve replacement 10/13/2009   Malignant neoplasm of prostate (HCC) 06/17/2009   Bilateral sensorineural hearing loss 06/16/2009   Essential hypertension 06/16/2009    Coronary atherosclerosis of native coronary artery 06/15/2009   Erectile dysfunction 05/18/2009   Obesity, unspecified 04/29/2008    PCP: Sandford Craze, NP  REFERRING PROVIDER: Sandford Craze, NP  REFERRING DIAG: [phone] (ICD-10-CM) - H/O: CVA (cerebrovascular accident)  THERAPY DIAG:  Muscle weakness (generalized)  Unsteadiness on feet  RATIONALE FOR EVALUATION AND TREATMENT: Rehabilitation  ONSET DATE: CVA in December 2023  NEXT MD VISIT: 11/13/22   SUBJECTIVE:   SUBJECTIVE STATEMENT: Pt reports he continues to note weakness in his R leg with activities such as stepping into car. Also has issues with balance such as when "passing the peace" in church, having to pause and maintain grip on other persons hand to steady himself before moving on.  EVAL: Had a stroke in December and it affected my right side. I do have some residual things going on- tremor when I'm trying to use my right hand, working on residual effects from bladder, and I'd like PT to work on my right foot "It feels like its waking up". Having a hard time feeling brake and gas pedals with my right foot. My balance is good. Have some cracking from my knee  sometimes from OA. Feel like strength could be an issue, when the foot is waking up there is some weakness. Have been trying to wake it up in the gym. They think that my INR levels are causing the strokes, working on getting it in an exact range to prevent more CVAs. I will use my walker if I'm going far especially in the heat.   PERTINENT HISTORY: Aneurysm of ascending aorta, aortic stenosis, B sensorineural hearing loss - extremely HOH, CAD s/p CABG, cardiomyopathy, AVR, chronic anticoagulation, DM-II with neuropathy, CVA of MCA, HTN, expressive aphasia, gout, lumbar stenosis s/p lumbar laminectomy and decompression 2023, shoulder OA, CKD, ventral hernia, hernia repair, hip surgery, hx multiple strokes (3-4), prostate cancer  PAIN:  Are you having pain?  No 0/10, "don't really have pain"   PRECAUTIONS: None  WEIGHT BEARING RESTRICTIONS: No  FALLS:  Has patient fallen in last 6 months? No  LIVING ENVIRONMENT: Lives with: lives with their family Lives in: House/apartment Stairs: No Has following equipment at home: Environmental consultant - 2 wheeled, Crutches, and Grab bars  OCCUPATION: retired   PLOF: Independent, Independent with basic ADLs, Independent with gait, and Independent with transfers  PATIENT GOALS: "get my right leg to wake up "     OBJECTIVE:   DIAGNOSTIC FINDINGS:  04/09/22 - MRI Brain IMPRESSION: 1. Positive for a tiny acute cortical infarct of the left frontal operculum, within an area of chronic Left MCA middle division infarct with encephalomalacia and laminar necrosis. No acute hemorrhage or mass effect. 2. Conspicuous increased intrinsic T1 signal in the left lentiform nuclei since December. Nonspecific, but favor this is a form of Wallerian degeneration secondary to the chronic ischemia in #1. 3. Elsewhere stable noncontrast MRI appearance of the brain since last year., including chronic bilateral cerebellar infarcts and occasional punctate chronic microhemorrhages.  04/09/22 - CT Head: IMPRESSION: 1. No acute intracranial abnormality. 2. Chronic left MCA and cerebellar infarcts.  PATIENT SURVEYS:  ABC scale 90.9% ; (09/25/22 - 90.0 %)  COGNITION: Overall cognitive status: Within functional limits for tasks assessed     SENSATION: "Feels like R LE is still waking up"   LOWER EXTREMITY MMT:  MMT Right eval Left eval R 09/13/22 L 09/13/22 R 09/25/22 L 09/25/22  Hip flexion 4- 4+ 4- 4+ 4 5  Hip extension   3+ 4 4 4+  Hip abduction 4+ 5 4- 4 4 4+  Hip adduction   4- 4 4 4+  Hip internal rotation   3+ 5 4 5   Hip external rotation   3+ 4 4- 4+  Knee flexion 4 4   5 5   Knee extension 4+ 4+   5 5  Ankle dorsiflexion 5 5   4+ 4  Ankle plantarflexion     4+  15 SLS HR 5  Ankle inversion     4 4+  Ankle eversion      4 4+   (Blank rows = not tested)  FUNCTIONAL TESTS:  3 minute walk test: 505 ft no device, mild unsteadiness on turns, moderate SOB by end of gait period  Able to hold SLS R LE 30 seconds, tandem stance with R foot in the back x30 seconds   09/25/22:  : 581 ft w/o AD, mild SOB Berg: 46/56; 46-51 moderate risk for falls (>50%)  GAIT: Distance walked: in clinic distances  Assistive device utilized: None Level of assistance: Complete Independence Comments: varus knees at baseline, mild unsteadiness on turns    TODAY'S TREATMENT:  DATE:   09/25/22 - Recert THERAPEUTIC ACTIVITIES: ABC scale = 1440 / 1600 = 90.0 % LE MMT : 581 ft w/o AD, mild SOB Berg: 46/56; 46-51 moderate risk for falls (>50%)  Goal assessment   09/20/22 THERAPEUTIC EXERCISE: to improve flexibility, strength and mobility.  Demonstration, verbal and tactile cues throughout for technique. BATCA ankle press 20# x 10 BATCA leg press 20# x 10 BATCA knee flexion/HS curls 25# B con/R ecc 2 x 10 BATCA knee extension 10# B con/R ecc 2 x 10 Standing R/L hip ABD with looped RTB at ankles x 10, UE support on back of chair for balance Standing R/L hip extension with looped RTB at ankles x 10, UE support on back of chair for balance Standing R/L hip flexion march with looped RTB at midfeet x 10, UE support on back of chair for balance S/L R clam 2 x 10, 1st set w/o resistance, 2nd set with looped RTB at distal thigh Seated R ankle eversion & DF with looped RTB x 10   09/12/25 THERAPEUTIC EXERCISE: to improve flexibility, strength and mobility.  Demonstration, verbal and tactile cues throughout for technique. Seated YTB R ankle inversion, eversion and DF 3 x 10, 1st set with PT stabilizing YTB, latter sets with self stabilization B heel raises with R eccentric heel lowering on level ground 2 x  10 B heel raises with R eccentric heel lowering with toes on 2" block and heels on ground 2 x 10 B heel raises with R eccentric heel lowering over edge of step 2 x 10  THERAPEUTIC EXERCISE: to improve flexibility, strength and mobility.  Demonstration, verbal and tactile cues throughout for technique.  Proximal LE MMT   PATIENT EDUCATION:  Education details: progress with PT and ongoing PT POC   Person educated: Patient Education method: Explanation Education comprehension: verbalized understanding  HOME EXERCISE PROGRAM: Access Code: [email or web address] URL: [email or web address] Date: 09/20/2022 Prepared by: Glenetta Hew  Exercises - Seated Ankle Dorsiflexion Stretch  - 2 x daily - 7 x weekly - 1 sets - 10 reps - 5 seconds  hold - Supine Bridge  - 2 x daily - 7 x weekly - 1 sets - 10 reps - 2 seconds  hold - Eccentric Heel Lowering on Step (Mirrored)  - 1 x daily - 7 x weekly - 2 sets - 10 reps - 3-5 sec hold - Eccentric Single Heel Raises with Leg Press  2 Up, 1 Down  (Mirrored)  - 1 x daily - 7 x weekly - 2 sets - 10 reps - 3 sec hold - Isometric Ankle Inversion at Wall  - 1 x daily - 7 x weekly - 2 sets - 10 reps - 3-5 sec hold - Isometric Ankle Eversion at Wall  - 1 x daily - 7 x weekly - 2 sets - 10 reps - 3-5 sec hold - Seated Toe Curl  - 1 x daily - 7 x weekly - 2 sets - 10 reps - 3 sec hold - Towel Scrunches  - 2-3 x daily - 7 x weekly - 2 sets - 15 reps - Seated Lesser Toes Extension  - 1 x daily - 7 x weekly - 2 sets - 10 reps - 3 sec hold - Toe Spreading  - 1 x daily - 7 x weekly - 2 sets - 10 reps - 3 sec hold - Seated Ankle Dorsiflexion with Resistance  - 1 x daily - 7 x weekly - 2 sets - 10 reps -  3 sec hold - Ankle Inversion with Anchored Resistance at Table  - 1 x daily - 7 x weekly - 2 sets - 10 reps - 3 sec hold - Seated Ankle Eversion with Resistance  - 1 x daily - 7 x weekly - 2 sets - 10 reps - 3 sec hold - Clam with Resistance  - 1 x daily - 3 x weekly - 2  sets - 10 reps - 3-5 sec hold - Standing Hip Abduction with Resistance at Ankles and Counter Support  - 1 x daily - 3 x weekly - 2 sets - 10 reps - 3 sec hold - Standing Hip Extension with Resistance at Ankles and Counter Support  - 1 x daily - 3 x weekly - 2 sets - 10 reps - 3 sec hold - Marching with Resistance  - 1 x daily - 3 x weekly - 2 sets - 10 reps - 3 sec hold - Sit to Stand with Resistance Around Legs  - 1 x daily - 3 x weekly - 2 sets - 10 reps   ASSESSMENT:  CLINICAL IMPRESSION: Kaedin "Len" reports ongoing RLE weakness which impacts his balance and mobility, creating difficulty with activities such as getting in and out of the car or maintaining balance while standing to shake hands for "peace" during church.  MMT revealing gains in overall strength, however R LE remains weaker than L LE for most muscle groups.  He notes improvement in sensation/proprioceptive awareness of right foot by 50-60%, stating he no longer be standing on a sponge (STG #3 met).  He was able to increase his distance to 581 feet during with only mild SOB (STG #4 nearly met).  He continues to be selectively compliant with his HEP and admits to not attempting toe exercises, therefore reviewed rationale and purpose of exercises to improve toe/foot intrinsic strength in relation to balance and normal gait pattern.  He feels that he has enough home exercises and exercises that he can perform at the gym, however he feels like he would like to continue with PT to continue to improve his R LE strength as well as his balance.  Berg balance test score of 46/56 indicating a moderate risk for falls.  Will recommend to recert for continued skilled PT 1x/wk for up to 4 more visits to address ongoing strength and balance deficits.  OBJECTIVE IMPAIRMENTS: Abnormal gait, decreased activity tolerance, decreased balance, difficulty walking, decreased strength, and obesity.   ACTIVITY LIMITATIONS: locomotion  level  PARTICIPATION LIMITATIONS: driving, shopping, and community activity  PERSONAL FACTORS: Age, Education, Fitness, Past/current experiences, Social background, and Time since onset of injury/illness/exacerbation are also affecting patient's functional outcome.   REHAB POTENTIAL: Good  CLINICAL DECISION MAKING: Evolving/moderate complexity  EVALUATION COMPLEXITY: Moderate   GOALS: Goals reviewed with patient? Yes  SHORT TERM GOALS: Target date: 09/27/2022    Will be compliant with appropriate progressive HEP  Baseline: Goal status: REVISED (refer to LTG #1 below)  09/25/22 - patient selectively compliant with HEP  2.  MMT to be 5/5 globally  Baseline:  Goal status: REVISED (refer to LTG #2 below)  09/25/22 - refer to above MMT table  3.  Will report 50% improvement in proprioception and 50% improvement in feelings of R LE "being awake"  Baseline:  Goal status: MET  09/25/22 - pt reports 50-60% improvement  4.  ABC score to improve by 5 points to show subjective progress  Baseline: 90.9% Goal status: REVISED (refer to LTG #  3 below)  09/25/22 - 1440 / 1600 = 90.0 %  5.  Will ambulate 600 ft in with minimal SOB  Baseline: 505 ft with moderate SOB Goal status: IN PROGRESS  09/25/22 - 581 ft with mild SOB  LONG TERM GOALS: Target date: 11/06/2022  1.  Patient will be independent with ongoing/advanced HEP for self-management at home Baseline:  Goal status: INITIAL   2.  Patient will demonstrate improved B LE strength to >/= 4+/5 for improved stability and ease of mobility  Baseline: Refer to above MMT table Goal status: INITIAL   3.  Patient will improve ABC scale score by >/5 points to demonstrate improved balance confidence. Baseline: 90.0% Goal status: INITIAL  4.  Patient will improve Berg score to >/= 52/56 to improve safety and stability with ADLs in standing and reduce risk for falls Baseline: 46/56 Goal status: INITIAL  5.  Patient will report improved  stability when "passing the peace" in church w/o need for extended hold on other person's hand due to improved balance. Baseline:  Goal status: INITIAL    PLAN:  PT FREQUENCY: 1x/week  PT DURATION: 4-6 weeks (4 visits)  PLANNED INTERVENTIONS: Therapeutic exercises, Therapeutic activity, Neuromuscular re-education, Balance training, Gait training, Patient/Family education, Self Care, Stair training, Manual therapy, and Re-evaluation  PLAN FOR NEXT SESSION: Static and dynamic balance progression; progress R foot and ankle strengthening and proprioceptive/balance training; proximal LE strengthening; HEP updates for exercise progression as pt requests   Marry Guan, PT  09/25/22 12:30 PM

## 2022-09-27 ENCOUNTER — Encounter: Payer: Self-pay | Admitting: Physician Assistant

## 2022-09-27 ENCOUNTER — Ambulatory Visit: Payer: Medicare HMO | Admitting: Physician Assistant

## 2022-09-27 VITALS — BP 126/68 | HR 65 | Resp 18 | Ht 69.0 in | Wt 214.0 lb

## 2022-09-27 DIAGNOSIS — R29898 Other symptoms and signs involving the musculoskeletal system: Secondary | ICD-10-CM | POA: Diagnosis not present

## 2022-09-27 DIAGNOSIS — I63412 Cerebral infarction due to embolism of left middle cerebral artery: Secondary | ICD-10-CM

## 2022-09-27 NOTE — Progress Notes (Addendum)
Assessment/Plan:   Memory Difficulty  [name] is a very pleasant 77 y.o. RH male with a history of hypertension, hyperlipidemia, DM2, prior history of CVA while on Coumadin with dysarthria and B leg weakness, OSA, St 3 CKD, CAD s/p CABG, s/p AVR on Plavix, h/o prostate cancer, h/o bradycardia, hemochromatosis, B hearing loss,  presenting today in follow-up for evaluation of memory difficulties.  Last neuropsych evaluation 09/02/2021 was largely within normal limits relative to age-matched peers.  He is not on antidementia medication as it is not indicated. He is now using his hearing aids with some improvement on comprehension. MMSE today is 30/30      Recommendations:   Follow up in 1 year Continue CPAP for OSA Continue hearing aids for hearing loss   Recommend increasing physical activity and socialization Recommend good control of cardiovascular risk factors.   Continue to control mood as per PCP  History acute ischemic stroke due to L MCA occlusion 2023 S/p small L MCA infarct, likely cardioembolic from mechanical heart valve in the setting of subtherapeutic INR March 2020  01/24/2022 he had acute ischemic stroke due to L MCA occlusion, likely thrombotic due to mechanical heart valve and non therapeutic coumadin following a  long distance trip to [place] , and then after Christmas, manifesting global aphasia and R sided weakness, L gaze deviation  He is s/p TICI 3 with revascularization 12/2021. On 03/2022  Brain MRI showed tiny cortical infarct of the left frontal operculum after presenting with global aphasia.. Carotid Dopplers March 2024 showed 1 to 39% bilateral stenosis. Echocardiogram March 2024 showed ejection fraction 60 to 65%, trace mitral regurgitation status post AVR with gradients not measured. Goal INR was increased to 3-3.5. He is followed by [facility].  He was on ASA and Plavix prior, now he is Coumadin and  baby ASA. He reports some numbness in feet and legs feeling heavy  since May 2024. He is on PT/OT. Etiology unclear but he does have lower back issues and is to see his Neurosurgeon soon. No saddle anesthesia reported. In view of his prior cardioembolic strokes it is felt prudent to repeat MRI brain to evaluate his vascular load.  Continue PT/OT/ST Continue Coumadin and daily baby ASA  Continue PT OT and ST for strength balance and speech improvement Recommend good control of cardiovascular risk factors, follow with Cards Repeat MRI brain to evaluate vascular load, or any structural abnormalities in the setting foot paresthesias.  We discussed NCS ( pt DM2) but he declines.  He has been instructed to go to the ER if acute stroke symptoms were to appear in the interim.   Subjective:   This patient is accompanied in the office by his wife  who supplements the history. Previous records as well as any outside records available were reviewed prior to todays visit.   Patient was last seen on 03/21/2022.    Any changes in memory since last visit? " It is doing alright".  He enjoys doing crosswords and reading the [place] times. Patient has some difficulty remembering recent conversations and people names.  repeats oneself?  Endorsed Disoriented when walking into a room?  Patient denies   Leaving objects in unusual places?  Patient denies   Wandering behavior?   Denies   Any personality changes since last visit?  Denies.  Occasionally irritable. Any worsening depression?: denies.   Hallucinations or paranoia?  Denies   Seizures?   Denies.    Any sleep changes? Sleeps well.  Denies vivid dreams, REM behavior or sleepwalking   Sleep apnea?  Endorsed, uses CPAP   Any hygiene concerns?   Denies.   Independent of bathing and dressing?  Endorsed  Does the patient needs help with medications? Patient is in charge   Who is in charge of the finances?  Patient is in charge    Any changes in appetite?  Denies.     Patient have trouble swallowing?  Denies.   Does the  patient cook?  Yes  Any kitchen accidents such as leaving the stove on?   denies .  Any headaches?    Denies.   Vision changes? Denies. Chronic pain?  Denies.   Ambulates with difficulty?   He has slight right-sided residual weakness and needs the walker for stability and safety, able to walk without it as well however.  He has some dizziness pending on the cardiac rate.  Right hand shows some tremors when trying to hold an object, otherwise no visible tremors.    He is doing PT, and OT.  He reports difficulty getting out of the car. Recent falls or head injuries?  No   Unilateral weakness, numbness or tingling?   He feels his R foot is still numb and feels "somewhat heavy". Vision changes? Denies  Any anosmia?  Denies.   Any incontinence of urine?  He uses a catheter as needed due to urine retention, followed by urology  Any bowel dysfunction?  Denies.     Patient lives with wife    Does the patient drive?  No longer drives due to mobility issues .   CTA head & neck: Acute and near-total occlusion of the left M1 segment 8 mm from the origin. Small amount of flow in the more distal MCA branches,presumably due to leptomeningeal collaterals. Atherosclerotic disease at both carotid bifurcations. 25% stenosis of the proximal ICA on the right. No measurable stenosis on the left. Atherosclerotic disease in both carotid siphon regions with serial stenoses estimated at 70% on the right and 50% on the left. 30% stenosis of the left vertebral artery origin.Both posterior cerebral arteries take fetal origin from the anterior circulation.6. Aortic atherosclerosis.   MRI: Mild restricted diffusion in the white matter tracts of the left basal ganglia and caudate, compatible with acute infarct Additional punctate acute infarct in the right perirolandic posterior frontal lobe. Small area of acute infarct versus artifact within the more anterior right frontal lobe white matter.   MRA: Left M1 MCA is now patent  after thrombectomy. No emergent large vessel  occlusion. 2D echo EF 50-55% LDL 53 HgbA1c 8.2 VTE prophylaxis - Lovenox/Coumadin Off  heparin IV, started Coumadin with Lovenox bridge per pharmacy recommendations. INR goal 2.5-3.5     MRI of the brain May 2023 showed small to moderate left MCA branch infarct affecting left insular and frontal cortex. 2. Small chronic cerebellar infarcts. Mild chronic microvascular ischemia and cerebral volume loss for age.   CT angio of the head and neck no intracranial large vessel occlusion. Moderate to severe narrowing of the right cavernous carotid and moderate narrowing in the right supraclinoid carotid, left cavernous carotid, and distal left V4. 2.  No hemodynamically significant stenosis in the neck. 3. Diffuse osseous sclerosis, which is nonspecific but can be seen in the setting of renal osteodystrophy, Paget's disease, and diffuse metastatic disease, among other conditions.   Latest labs triglycerides 240, HDL 30 VLDL 48, A1c 8.3 2D Echo EF > 75%, normal function of mechanical valve  04/09/22 - MRI Brain 1. Positive for a tiny acute cortical infarct of the left frontal operculum, within an area of chronic Left MCA middle division infarct with encephalomalacia and laminar necrosis. No acute hemorrhage or mass effect.2. Conspicuous increased intrinsic T1 signal in the left lentiforM nuclei since December. Nonspecific, but favor this is a form of Wallerian degeneration secondary to the chronic ischemia in #1.3. Elsewhere stable noncontrast MRI appearance of the brain since last year., including chronic bilateral cerebellar infarcts and occasional punctate chronic microhemorrhages.        Neuropsych evaluation 09/02/21 Briefly, results suggested neuropsychological functioning largely within normal limits relative to age-matched peers. He exhibited mild performance variability across executive functioning and semantic fluency. Specific to the latter, all  other assessments of expressive language were strong. Performances were likewise appropriate across processing speed, attention/concentration, safety/judgment, receptive language, visuospatial abilities, and all aspects of learning and memory. Given Mr. Halberg's handedness and the location of his May 2023 infarct (left MCA with frontal cortex involvement), some weaknesses in executive functioning and expressive language would be an expected finding. Current scores reflect an expected pattern of weakness and are likely directly related to this event. Relative to his wife's pre-stroke concerns surrounding memory decline, day-to-day dysfunction can likely be accounted for by significant and largely uncorrected hearing loss.    Past Medical History:  Diagnosis Date   Actinic keratosis 02/14/2011   Allergy    Aneurysm of ascending aorta without rupture (HCC) 01/06/2022   Aortic stenosis    Arthritis    Bilateral sensorineural hearing loss 06/16/2009   CAD (coronary artery disease)    cabg   Complication of anesthesia    Coronary atherosclerosis of native coronary artery 06/15/2009   Diabetic polyneuropathy 11/11/2018   Dyslipidemia 11/05/2018   Embolic stroke involving left middle cerebral artery  05/30/2021   Erectile dysfunction 05/18/2009   Essential hypertension 06/16/2009   Expressive aphasia 06/25/2021   External hemorrhoids 03/21/2010   Gout 04/09/2015   Heart murmur    Hemochromatosis 02/14/2011   History of aortic valve replacement 10/13/2009   History of CABG 11/05/2018   2005- 1. Coronary artery bypass grafting x4 with LIMA-LAD, SVG-OM1, SVG-AM and dCFX.  2. Aortic valve replacement with [employer] AVR 3. Septal myomectomy.  4. Myoview low risk 2016   History of hepatitis 01/31/1972   Hypertriglyceridemia 06/13/2010   Hypertrophic obstructive cardiomyopathy 05/06/2008   SP septal myomectomy at surgery 2005 Echo Nov 2018- Hyperdynamic LVEF with severe basal septal hypertrophy. There  is chordal SAM with resting gradient of 16 mmHg that increases to 85 mmHg with Valsalva        Leg weakness, bilateral 01/18/2021   Long term (current) use of anticoagulants 05/22/2018   Lumbar stenosis with neurogenic claudication 02/04/2021   Malignant neoplasm of prostate 06/17/2009   Mechanical heart valve present    Manufacturer: [employer] #: [id number]  Model #: [id number]. Card states MRI compatible with 3 teslas or less.   Obesity, unspecified 04/29/2008   OSA (obstructive sleep apnea) 07/12/2012   moderate-severe; uses CPAP nightly   PONV (postoperative nausea and vomiting)    after CABG- slow to wake up   Presence of prosthetic heart valve    Primary osteoarthritis of left shoulder 10/31/2018   S/P lumbar laminectomy 02/04/2021   Stage 3 chronic kidney disease due to type 2 diabetes mellitus 11/11/2018   Tobacco use 06/16/2009   Type 2 diabetes mellitus with hyperglycemia    Ventral hernia 03/03/2014  Past Surgical History:  Procedure Laterality Date   AORTIC VALVE REPLACEMENT  11/14/2003   [employer] [facility]   APPENDECTOMY  1990   CHOLECYSTECTOMY  1990   CORONARY ANGIOGRAPHY  10/11/2021   CORONARY ARTERY BYPASS GRAFT  10/2003   HERNIA REPAIR  1999   right, inguinal   HERNIA REPAIR  2002   left, inguinal   HIP SURGERY  2006   right hip   IR CT HEAD LTD  02/03/2022   IR PERCUTANEOUS ART THROMBECTOMY/INFUSION INTRACRANIAL INC DIAG ANGIO  01/24/2022   LUMBAR LAMINECTOMY/DECOMPRESSION MICRODISCECTOMY Bilateral 02/04/2021   Procedure: Bilateral Lumbar Two-Three Laminectomy;  Surgeon: [name], MD;  Location: [facility] OR;  Service: Neurosurgery;  Laterality: Bilateral;  3C/RM 20   PILONIDAL CYST EXCISION  1964   prostate seed implant  03/2010   RADIOLOGY WITH ANESTHESIA N/A 01/24/2022   Procedure: IR WITH ANESTHESIA;  Surgeon: Radiologist, Medication, MD;  Location: [facility] OR;  Service: Radiology;  Laterality: N/A;   TEE WITHOUT CARDIOVERSION N/A 03/14/2022   Procedure:  TRANSESOPHAGEAL ECHOCARDIOGRAM (TEE);  Surgeon: Parke Poisson, MD;  [facility] ENDOSCOPY;  Service: Cardiology;  Laterality: N/A;   TONSILLECTOMY  childhood     PREVIOUS MEDICATIONS:   CURRENT MEDICATIONS:  Outpatient Encounter Medications as of 09/27/2022  Medication Sig   AMBULATORY NON FORMULARY MEDICATION cpap cushions            AirFit F20 (Size: Large)           AirFit F30 (Size: Med)   Please FAX the prescription to:  [phone]   aspirin EC 81 MG tablet Take 1 tablet (81 mg total) by mouth daily. Swallow whole.   betamethasone dipropionate (DIPROLENE) 0.05 % cream Apply topically 2 (two) times daily as needed. To eczema rash (Patient taking differently: Apply 1 application  topically 2 (two) times daily as needed (to eczema/rash).)   Blood Glucose Monitoring Suppl Supplies MISC Use for monitoring glucose level   clopidogrel (PLAVIX) 75 MG tablet Take 75 mg by mouth daily.   colchicine 0.6 MG tablet TAKE 1 TABLET (0.6 MG TOTAL) BY MOUTH 2 (TWO) TIMES DAILY AS NEEDED.   Continuous Blood Gluc Sensor (DEXCOM G6 SENSOR) MISC 1 Device by Does not apply route as directed.   dapagliflozin propanediol (FARXIGA) 10 MG TABS tablet Take 1 tablet (10 mg total) by mouth daily before breakfast.   erythromycin ophthalmic ointment 1 Application See admin instructions. Apply to affected eye(s) as directed for styes   fenofibrate (TRICOR) 145 MG tablet TAKE 1 TABLET BY MOUTH EVERY DAY   furosemide (LASIX) 40 MG tablet Take 1 tablet (40 mg total) by mouth daily.   glucose blood (ONETOUCH VERIO) test strip Use as instructed   insulin aspart (NOVOLOG FLEXPEN) 100 UNIT/ML FlexPen Max daily 75 units (Patient taking differently: Inject 14-19 Units into the skin See admin instructions. Inject 16 units into the skin with breakfast, 14 units with lunch, and 16 units with supper/evening meal- may increase to up to 19 units per dose as needed for high blood sugar)   Insulin Glargine (BASAGLAR KWIKPEN)  100 UNIT/ML Inject 22 Units into the skin daily. (Patient taking differently: Inject 22 Units into the skin at bedtime.)   Insulin Pen Needle 32G X 4 MM MISC 1 Device by Does not apply route in the morning, at noon, in the evening, and at bedtime.   isosorbide mononitrate (IMDUR) 30 MG 24 hr tablet TAKE 1 TABLET BY MOUTH EVERY DAY   ketoconazole (NIZORAL) 2 %  cream Apply 1 Application topically daily as needed for irritation.   metoprolol succinate (TOPROL-XL) 25 MG 24 hr tablet TAKE 1 TABLET BY MOUTH EVERY DAY   OneTouch Delica Lancets 33G MISC USE AS DIRECTED   pantoprazole (PROTONIX) 40 MG tablet Take 1 tablet (40 mg total) by mouth daily.   potassium chloride SA (KLOR-CON M) 20 MEQ tablet Take 1 tablet (20 mEq total) by mouth 2 (two) times daily.   PRESCRIPTION MEDICATION CPAP- At bedtime   rosuvastatin (CRESTOR) 40 MG tablet TAKE 1 TABLET BY MOUTH EVERY DAY   Semaglutide,0.25 or 0.5MG /DOS, 2 MG/3ML SOPN Inject 0.5 mg into the skin once a week.   tacrolimus (PROTOPIC) 0.1 % ointment Apply 1 application  topically 2 (two) times daily as needed (for eczema).   tamsulosin (FLOMAX) 0.4 MG CAPS capsule Take 1 capsule (0.4 mg total) by mouth daily.   warfarin (COUMADIN) 7.5 MG tablet 3.75 mg on Monday/Friday and 7.5 mg the rest of the days.   No facility-administered encounter medications on file as of 09/27/2022.     Objective:     PHYSICAL EXAMINATION:    VITALS:   Vitals:   09/27/22 1132  BP: 126/68  Pulse: 65  Resp: 18  SpO2: 98%  Weight: 214 lb (97.1 kg)  Height: 5\' 9"  (1.753 m)    GEN:  The patient appears stated age and is in NAD. HEENT:  Normocephalic, atraumatic.   Neurological examination:  General: NAD, well-groomed, appears stated age. Orientation: The patient is alert. Oriented to person, place and date Cranial nerves: There is good facial symmetry.The speech is fluent and clear. No aphasia or dysarthria. Fund of knowledge is appropriate. Recent and remote memory  is normal.  Attention and concentration are normal.  Able to name objects and repeat phrases.  Hearing is intact to conversational tone with hearing aids.   Delayed recall 3/3 Sensation: Sensation is intact to light touch throughout. Motor: Minimal right-sided weakness, uses a walker to ambulate. Unable to extend toes B.  Strength is at least antigravity x4. DTR's1 /4 in UE/LE      12/28/2020    8:00 AM  Montreal Cognitive Assessment   Visuospatial/ Executive (0/5) 5  Naming (0/3) 3  Attention: Read list of digits (0/2) 2  Attention: Read list of letters (0/1) 0  Attention: Serial 7 subtraction starting at 100 (0/3) 3  Language: Repeat phrase (0/2) 1  Language : Fluency (0/1) 1  Abstraction (0/2) 2  Delayed Recall (0/5) 5  Orientation (0/6) 6  Total 28       09/27/2022   12:00 PM 01/19/2016    2:24 PM  MMSE - Mini Mental State Exam  Orientation to time 5 5  Orientation to Place 5 5  Registration 3 3  Attention/ Calculation 5 5  Recall 3 2  Language- name 2 objects 2 2  Language- repeat 1 1  Language- follow 3 step command 3 3  Language- read & follow direction 1 1  Write a sentence 1 1  Copy design 1 1  Total score 30 29       Movement examination: Tone: There is normal tone in the UE/LE Abnormal movements: Mild chronic right hand tremor when holding an object.  No myoclonus.  No asterixis.   Coordination:  There is no decremation with RAM's. Normal finger to nose  Gait and Station: The patient has no difficulty arising out of a deep-seated chair without the use of the hands. The patient's stride length  is good.  Gait is cautious and narrow.   Thank you for allowing [name] the opportunity to participate in the care of this nice patient. Please do not hesitate to contact us for any questions or concerns.   Total time spent on today's visit was 40 minutes dedicated to this patient today, preparing to see patient, examining the patient, ordering tests and/or medications and  counseling the patient, documenting clinical information in the EHR or other health record, independently interpreting results and communicating results to the patient/family, discussing treatment and goals, answering patient's questions and coordinating care.  Cc:  Sandford Craze, NP  Marlowe Kays 09/27/2022 12:46 PM

## 2022-09-27 NOTE — Patient Instructions (Signed)
It was a pleasure to see you today at our office.   Recommendations:  Follow up in 1 year  MRI brain for stroke follow up and numbness leg Continue CPAP Continue coumadin and aspirin and follow with Cardiology Recommend Home PT   Follow up with NS for back issues   Whom to call:  Memory  decline, memory medications: Call our office [phone]   For psychiatric meds, mood meds: Please have your primary care physician manage these medications.      For assessment of decision of mental capacity and competency:  Call Dr. Erick Blinks, geriatric psychiatrist at [phone]  For guidance in geriatric dementia issues please call [facility] [phone]   If you have any severe symptoms of a stroke, or other severe issues such as confusion,severe chills or fever, etc call 911 or go to the ER as you may need to be evaluated further   Feel free to visit Facebook page " [employer]" for tips of how to care for people with memory problems.    Feel free to go to the following database for funded clinical studies conducted around the world: RankChecks.se   https://www.triadclinicaltrials.com/     RECOMMENDATIONS FOR ALL PATIENTS WITH MEMORY PROBLEMS: 1. Continue to exercise (Recommend 30 minutes of walking everyday, or 3 hours every week) 2. Increase social interactions - continue going to [place] and enjoy social gatherings with friends and family 3. Eat healthy, avoid fried foods and eat more fruits and vegetables 4. Maintain adequate blood pressure, blood sugar, and blood cholesterol level. Reducing the risk of stroke and cardiovascular disease also helps promoting better memory. 5. Avoid stressful situations. Live a simple life and avoid aggravations. Organize your time and prepare for the next day in anticipation. 6. Sleep well, avoid any interruptions of sleep and avoid any distractions in the bedroom that may interfere with adequate sleep quality 7. Avoid  sugar, avoid sweets as there is a strong link between excessive sugar intake, diabetes, and cognitive impairment We discussed the Mediterranean diet, which has been shown to help patients reduce the risk of progressive memory disorders and reduces cardiovascular risk. This includes eating fish, eat fruits and green leafy vegetables, nuts like almonds and hazelnuts, walnuts, and also use olive oil. Avoid fast foods and fried foods as much as possible. Avoid sweets and sugar as sugar use has been linked to worsening of memory function.  There is always a concern of gradual progression of memory problems. If this is the case, then we may need to adjust level of care according to patient needs. Support, both to the patient and caregiver, should then be put into place.    FALL PRECAUTIONS: Be cautious when walking. Scan the area for obstacles that may increase the risk of trips and falls. When getting up in the mornings, sit up at the edge of the bed for a few minutes before getting out of bed. Consider elevating the bed at the head end to avoid drop of blood pressure when getting up. Walk always in a well-lit room (use night lights in the walls). Avoid area rugs or power cords from appliances in the middle of the walkways. Use a walker or a cane if necessary and consider physical therapy for balance exercise. Get your eyesight checked regularly.  FINANCIAL OVERSIGHT: Supervision, especially oversight when making financial decisions or transactions is also recommended.  HOME SAFETY: Consider the safety of the kitchen when operating appliances like stoves, microwave oven, and blender. Consider having  supervision and share cooking responsibilities until no longer able to participate in those. Accidents with firearms and other hazards in the house should be identified and addressed as well.   ABILITY TO BE LEFT ALONE: If patient is unable to contact 911 operator, consider using LifeLine, or when the need is  there, arrange for someone to stay with patients. Smoking is a fire hazard, consider supervision or cessation. Risk of wandering should be assessed by caregiver and if detected at any point, supervision and safe proof recommendations should be instituted.  MEDICATION SUPERVISION: Inability to self-administer medication needs to be constantly addressed. Implement a mechanism to ensure safe administration of the medications.   DRIVING: Regarding driving, in patients with progressive memory problems, driving will be impaired. We advise to have someone else do the driving if trouble finding directions or if minor accidents are reported. Independent driving assessment is available to determine safety of driving.   If you are interested in the driving assessment, you can contact the following:  [employer] in [place] [phone]  [facility] [phone]  [facility] [phone]  [facility] [phone] or [phone]

## 2022-10-03 ENCOUNTER — Ambulatory Visit: Payer: Medicare HMO | Attending: Cardiology

## 2022-10-03 DIAGNOSIS — Z952 Presence of prosthetic heart valve: Secondary | ICD-10-CM | POA: Diagnosis not present

## 2022-10-03 DIAGNOSIS — Z7901 Long term (current) use of anticoagulants: Secondary | ICD-10-CM | POA: Diagnosis not present

## 2022-10-03 LAB — POCT INR: INR: 3.5 — AB (ref 2.0–3.0)

## 2022-10-03 NOTE — Patient Instructions (Signed)
continue taking 1 tablet daily except 1/2 tablet on Mondays, Wednesdays, and Fridays.  Continue consistency with leafy green intake 3 servings/week.  Recheck INR in 2 weeks.  [facility] [phone].

## 2022-10-08 ENCOUNTER — Other Ambulatory Visit: Payer: Self-pay | Admitting: Cardiology

## 2022-10-08 DIAGNOSIS — Z952 Presence of prosthetic heart valve: Secondary | ICD-10-CM

## 2022-10-08 DIAGNOSIS — I480 Paroxysmal atrial fibrillation: Secondary | ICD-10-CM

## 2022-10-12 ENCOUNTER — Encounter: Payer: Self-pay | Admitting: Physical Therapy

## 2022-10-12 ENCOUNTER — Ambulatory Visit: Payer: Medicare HMO | Attending: Family | Admitting: Physical Therapy

## 2022-10-12 DIAGNOSIS — M6281 Muscle weakness (generalized): Secondary | ICD-10-CM | POA: Diagnosis not present

## 2022-10-12 DIAGNOSIS — R2681 Unsteadiness on feet: Secondary | ICD-10-CM | POA: Insufficient documentation

## 2022-10-12 NOTE — Therapy (Signed)
OUTPATIENT PHYSICAL THERAPY TREATMENT    Patient Name: [name] MRN: [id number] DOB:1945-12-27, 77 y.o., male Today's Date: 10/12/2022  END OF SESSION:  PT End of Session - 10/12/22 0933     Visit Number 6    Date for PT Re-Evaluation 11/06/22    Authorization Type [employer] MCR    Progress Note Due on Visit 10    PT Start Time 0933    PT Stop Time 1014    PT Time Calculation (min) 41 min    Activity Tolerance Patient tolerated treatment well;Patient limited by fatigue    Behavior During Therapy [facility] for tasks assessed/performed;Impulsive                Past Medical History:  Diagnosis Date   Actinic keratosis 02/14/2011   Allergy    Aneurysm of ascending aorta without rupture (HCC) 01/06/2022   Aortic stenosis    Arthritis    Bilateral sensorineural hearing loss 06/16/2009   CAD (coronary artery disease)    cabg   Complication of anesthesia    Coronary atherosclerosis of native coronary artery 06/15/2009   Diabetic polyneuropathy 11/11/2018   Dyslipidemia 11/05/2018   Embolic stroke involving left middle cerebral artery  05/30/2021   Erectile dysfunction 05/18/2009   Essential hypertension 06/16/2009   Expressive aphasia 06/25/2021   External hemorrhoids 03/21/2010   Gout 04/09/2015   Heart murmur    Hemochromatosis 02/14/2011   History of aortic valve replacement 10/13/2009   History of CABG 11/05/2018   2005- 1. Coronary artery bypass grafting x4 with LIMA-LAD, SVG-OM1, SVG-AM and dCFX.  2. Aortic valve replacement with [employer] AVR 3. Septal myomectomy.  4. Myoview low risk 2016   History of hepatitis 01/31/1972   Hypertriglyceridemia 06/13/2010   Hypertrophic obstructive cardiomyopathy 05/06/2008   SP septal myomectomy at surgery 2005 Echo Nov 2018- Hyperdynamic LVEF with severe basal septal hypertrophy. There is chordal SAM with resting gradient of 16 mmHg that increases to 85 mmHg with Valsalva        Leg weakness, bilateral 01/18/2021   Long term  (current) use of anticoagulants 05/22/2018   Lumbar stenosis with neurogenic claudication 02/04/2021   Malignant neoplasm of prostate 06/17/2009   Mechanical heart valve present    Manufacturer: [employer] Duty #: [id number]  Model #: [id number]. Card states MRI compatible with 3 teslas or less.   Obesity, unspecified 04/29/2008   OSA (obstructive sleep apnea) 07/12/2012   moderate-severe; uses CPAP nightly   PONV (postoperative nausea and vomiting)    after CABG- slow to wake up   Presence of prosthetic heart valve    Primary osteoarthritis of left shoulder 10/31/2018   S/P lumbar laminectomy 02/04/2021   Stage 3 chronic kidney disease due to type 2 diabetes mellitus 11/11/2018   Tobacco use 06/16/2009   Type 2 diabetes mellitus with hyperglycemia    Ventral hernia 03/03/2014   Past Surgical History:  Procedure Laterality Date   AORTIC VALVE REPLACEMENT  11/14/2003   [facility]   APPENDECTOMY  1990   CHOLECYSTECTOMY  1990   CORONARY ANGIOGRAPHY  10/11/2021   CORONARY ARTERY BYPASS GRAFT  10/2003   HERNIA REPAIR  1999   right, inguinal   HERNIA REPAIR  2002   left, inguinal   HIP SURGERY  2006   right hip   IR CT HEAD LTD  02/03/2022   IR PERCUTANEOUS ART THROMBECTOMY/INFUSION INTRACRANIAL INC DIAG ANGIO  01/24/2022   LUMBAR LAMINECTOMY/DECOMPRESSION MICRODISCECTOMY Bilateral 02/04/2021  Procedure: Bilateral Lumbar Two-Three Laminectomy;  Surgeon: [name], MD;  Location: [facility] OR;  Service: Neurosurgery;  Laterality: Bilateral;  3C/RM 20   PILONIDAL CYST EXCISION  1964   prostate seed implant  03/2010   RADIOLOGY WITH ANESTHESIA N/A 01/24/2022   Procedure: IR WITH ANESTHESIA;  Surgeon: Radiologist, Medication, MD;  Location: [facility] OR;  Service: Radiology;  Laterality: N/A;   TEE WITHOUT CARDIOVERSION N/A 03/14/2022   Procedure: TRANSESOPHAGEAL ECHOCARDIOGRAM (TEE);  Surgeon: Parke Poisson, MD;  Location: [facility] ENDOSCOPY;  Service: Cardiology;  Laterality: N/A;    TONSILLECTOMY  childhood   Patient Active Problem List   Diagnosis Date Noted   Hypertrophic cardiomyopathy (HCC) 08/12/2022   Chronic anticoagulation 08/12/2022   Hypotension 04/27/2022   H/O: CVA (cerebrovascular accident) 03/14/2022   Urinary retention 01/26/2022   Acute ischemic stroke (HCC) 01/24/2022   Middle cerebral artery embolism, left 01/24/2022   Aneurysm of ascending aorta without rupture (HCC) 01/06/2022   Type 2 diabetes mellitus with stage 3a chronic kidney disease, with long-term current use of insulin (HCC) 10/30/2021   Diabetes mellitus (HCC) 10/30/2021   Type 2 diabetes mellitus with diabetic polyneuropathy, with long-term current use of insulin (HCC) 10/30/2021   NSTEMI (non-ST elevated myocardial infarction) (HCC) 10/09/2021   Paroxysmal atrial fibrillation (HCC) 10/09/2021   Presence of prosthetic heart valve    Type 2 diabetes mellitus with hyperglycemia    Expressive aphasia 06/25/2021   CVA (cerebral vascular accident) (HCC) 05/2021   S/P lumbar laminectomy 02/04/2021   Lumbar stenosis with neurogenic claudication 02/04/2021   History of falling 01/30/2021   Diabetic polyneuropathy 11/11/2018   Stage 3 chronic kidney disease due to type 2 diabetes mellitus 11/11/2018   History of CABG 11/05/2018   Dyslipidemia 11/05/2018   Primary osteoarthritis of left shoulder 10/31/2018   Long term (current) use of anticoagulants 05/22/2018   Ventral hernia 03/03/2014   OSA (obstructive sleep apnea) 07/12/2012   Hemochromatosis 02/14/2011   Actinic keratosis 02/14/2011   Hypertriglyceridemia 06/13/2010   External hemorrhoids 03/21/2010   History of aortic valve replacement 10/13/2009   Malignant neoplasm of prostate (HCC) 06/17/2009   Bilateral sensorineural hearing loss 06/16/2009   Essential hypertension 06/16/2009   Coronary atherosclerosis of native coronary artery 06/15/2009   Erectile dysfunction 05/18/2009   Obesity, unspecified 04/29/2008    PCP:  Sandford Craze, NP  REFERRING PROVIDER: Sandford Craze, NP  REFERRING DIAG: [phone] (ICD-10-CM) - H/O: CVA (cerebrovascular accident)  THERAPY DIAG:  Muscle weakness (generalized)  Unsteadiness on feet  RATIONALE FOR EVALUATION AND TREATMENT: Rehabilitation  ONSET DATE: CVA in December 2023  NEXT MD VISIT: 11/13/22   SUBJECTIVE:   SUBJECTIVE STATEMENT: Pt reports he was traveling to visit his mother-in-law for a week and only did minimal exercises while he was traveling as he did not have access to the gym.  EVAL: Had a stroke in December and it affected my right side. I do have some residual things going on- tremor when I'm trying to use my right hand, working on residual effects from bladder, and I'd like PT to work on my right foot "It feels like its waking up". Having a hard time feeling brake and gas pedals with my right foot. My balance is good. Have some cracking from my knee sometimes from OA. Feel like strength could be an issue, when the foot is waking up there is some weakness. Have been trying to wake it up in the gym. They think that my INR levels are causing the  strokes, working on getting it in an exact range to prevent more CVAs. I will use my walker if I'm going far especially in the heat.   PERTINENT HISTORY: Aneurysm of ascending aorta, aortic stenosis, B sensorineural hearing loss - extremely HOH, CAD s/p CABG, cardiomyopathy, AVR, chronic anticoagulation, DM-II with neuropathy, CVA of MCA, HTN, expressive aphasia, gout, lumbar stenosis s/p lumbar laminectomy and decompression 2023, shoulder OA, CKD, ventral hernia, hernia repair, hip surgery, hx multiple strokes (3-4), prostate cancer  PAIN:  Are you having pain? No 0/10, "don't really have pain"   PRECAUTIONS: None  WEIGHT BEARING RESTRICTIONS: No  FALLS:  Has patient fallen in last 6 months? No  LIVING ENVIRONMENT: Lives with: lives with their family Lives in: House/apartment Stairs: No Has  following equipment at home: Environmental consultant - 2 wheeled, Crutches, and Grab bars  OCCUPATION: retired   PLOF: Independent, Independent with basic ADLs, Independent with gait, and Independent with transfers  PATIENT GOALS: "get my right leg to wake up "     OBJECTIVE:   DIAGNOSTIC FINDINGS:  04/09/22 - MRI Brain IMPRESSION: 1. Positive for a tiny acute cortical infarct of the left frontal operculum, within an area of chronic Left MCA middle division infarct with encephalomalacia and laminar necrosis. No acute hemorrhage or mass effect. 2. Conspicuous increased intrinsic T1 signal in the left lentiform nuclei since December. Nonspecific, but favor this is a form of Wallerian degeneration secondary to the chronic ischemia in #1. 3. Elsewhere stable noncontrast MRI appearance of the brain since last year., including chronic bilateral cerebellar infarcts and occasional punctate chronic microhemorrhages.  04/09/22 - CT Head: IMPRESSION: 1. No acute intracranial abnormality. 2. Chronic left MCA and cerebellar infarcts.  PATIENT SURVEYS:  ABC scale 90.9% ; (09/25/22 - 90.0 %)  COGNITION: Overall cognitive status: Within functional limits for tasks assessed     SENSATION: "Feels like R LE is still waking up"   LOWER EXTREMITY MMT:  MMT Right eval Left eval R 09/13/22 L 09/13/22 R 09/25/22 L 09/25/22  Hip flexion 4- 4+ 4- 4+ 4 5  Hip extension   3+ 4 4 4+  Hip abduction 4+ 5 4- 4 4 4+  Hip adduction   4- 4 4 4+  Hip internal rotation   3+ 5 4 5   Hip external rotation   3+ 4 4- 4+  Knee flexion 4 4   5 5   Knee extension 4+ 4+   5 5  Ankle dorsiflexion 5 5   4+ 4  Ankle plantarflexion     4+  15 SLS HR 5  Ankle inversion     4 4+  Ankle eversion     4 4+   (Blank rows = not tested)  FUNCTIONAL TESTS:  3 minute walk test: 505 ft no device, mild unsteadiness on turns, moderate SOB by end of gait period  Able to hold SLS R LE 30 seconds, tandem stance with R foot in the back x30  seconds   09/25/22:  : 581 ft w/o AD, mild SOB Berg: 46/56; 46-51 moderate risk for falls (>50%)  GAIT: Distance walked: in clinic distances  Assistive device utilized: None Level of assistance: Complete Independence Comments: varus knees at baseline, mild unsteadiness on turns    TODAY'S TREATMENT:  DATE:   10/12/22 THERAPEUTIC EXERCISE: to improve flexibility, strength and mobility.  Demonstration, verbal and tactile cues throughout for technique. TM - 2.0 mph x 4 min  NEUROMUSCULAR RE-EDUCATION: To improve balance, proprioception, coordination, and reduce fall risk.  Alt fwd step + contralateral reach to cone atop 36"FR x 20 Corner balance progression with 3/4 tandem stance, staggered stance, and tandem stance (1 set with each foot fwd) on firm surface with arms crossed on chest (SBA/CGA of PT for safety)            Eyes open: - static stance 2 x 30 sec (tandem stance) - horiz head turns x 5 (tandem stance) - vertical head nods x 5 (3/4 tandem stance, staggered stance) - trunk rotation x 5 Eyes closed: (3/4 tandem stance) - static stance x 30 sec - horiz head turns x 5 - vertical head nods x 5 - trunk rotation x 5 Corner balance progression with right SLS and left SLS on firm surface with arms wide, arms at sides, and intermittent 1-2 finger support on back of chair  (SBA/CGA of PT for safety)            Eyes open: - static stance 3 x 30 sec Alt toe clears from Airex pad to 9" stool (standing in corner for safety) x 20 - more difficulty on R   09/25/22 - Recert THERAPEUTIC ACTIVITIES: ABC scale = 1440 / 1600 = 90.0 % LE MMT : 581 ft w/o AD, mild SOB Berg: 46/56; 46-51 moderate risk for falls (>50%)  Goal assessment   09/20/22 THERAPEUTIC EXERCISE: to improve flexibility, strength and mobility.  Demonstration, verbal and tactile cues  throughout for technique. BATCA ankle press 20# x 10 BATCA leg press 20# x 10 BATCA knee flexion/HS curls 25# B con/R ecc 2 x 10 BATCA knee extension 10# B con/R ecc 2 x 10 Standing R/L hip ABD with looped RTB at ankles x 10, UE support on back of chair for balance Standing R/L hip extension with looped RTB at ankles x 10, UE support on back of chair for balance Standing R/L hip flexion march with looped RTB at midfeet x 10, UE support on back of chair for balance S/L R clam 2 x 10, 1st set w/o resistance, 2nd set with looped RTB at distal thigh Seated R ankle eversion & DF with looped RTB x 10    PATIENT EDUCATION:  Education details: HEP update - corner balance activities    Person educated: Patient Education method: Explanation Education comprehension: verbalized understanding  HOME EXERCISE PROGRAM: Access Code: [email or web address] URL: [email or web address] Date: 09/20/2022 Prepared by: Glenetta Hew  Exercises - Seated Ankle Dorsiflexion Stretch  - 2 x daily - 7 x weekly - 1 sets - 10 reps - 5 seconds  hold - Supine Bridge  - 2 x daily - 7 x weekly - 1 sets - 10 reps - 2 seconds  hold - Eccentric Heel Lowering on Step (Mirrored)  - 1 x daily - 7 x weekly - 2 sets - 10 reps - 3-5 sec hold - Eccentric Single Heel Raises with Leg Press  2 Up, 1 Down  (Mirrored)  - 1 x daily - 7 x weekly - 2 sets - 10 reps - 3 sec hold - Isometric Ankle Inversion at Wall  - 1 x daily - 7 x weekly - 2 sets - 10 reps - 3-5 sec hold - Isometric Ankle Eversion at Wall  - 1 x daily - 7 x  weekly - 2 sets - 10 reps - 3-5 sec hold - Seated Toe Curl  - 1 x daily - 7 x weekly - 2 sets - 10 reps - 3 sec hold - Towel Scrunches  - 2-3 x daily - 7 x weekly - 2 sets - 15 reps - Seated Lesser Toes Extension  - 1 x daily - 7 x weekly - 2 sets - 10 reps - 3 sec hold - Toe Spreading  - 1 x daily - 7 x weekly - 2 sets - 10 reps - 3 sec hold - Seated Ankle Dorsiflexion with Resistance  - 1 x daily - 7 x weekly - 2  sets - 10 reps - 3 sec hold - Ankle Inversion with Anchored Resistance at Table  - 1 x daily - 7 x weekly - 2 sets - 10 reps - 3 sec hold - Seated Ankle Eversion with Resistance  - 1 x daily - 7 x weekly - 2 sets - 10 reps - 3 sec hold - Clam with Resistance  - 1 x daily - 3 x weekly - 2 sets - 10 reps - 3-5 sec hold - Standing Hip Abduction with Resistance at Ankles and Counter Support  - 1 x daily - 3 x weekly - 2 sets - 10 reps - 3 sec hold - Standing Hip Extension with Resistance at Ankles and Counter Support  - 1 x daily - 3 x weekly - 2 sets - 10 reps - 3 sec hold - Marching with Resistance  - 1 x daily - 3 x weekly - 2 sets - 10 reps - 3 sec hold - Sit to Stand with Resistance Around Legs  - 1 x daily - 3 x weekly - 2 sets - 10 reps   ASSESSMENT:  CLINICAL IMPRESSION: David "Len" reports limited performance of HEP while traveling and feels like he has regressed because of this. Focus of therapy session on balance activities, in particular stepping and reaching activities as well as partial to full tandem stance activities to address he reported instability when he reaches to shake hands with people during church services.  Patient having difficulty in full tandem but able to perform balance exercises in 3/4 tandem and staggered stance with decreased LOB.  Increased difficulty also noted with R hip and knee flexion during toe clears.  Provided guidance on safe performance of balance exercises at home using corner set-up with chair to reduce fall risk.  Reda "Len" will benefit from continued skilled PT to address ongoing strength and balance deficits to improve mobility and activity tolerance with decreased risk for falls.   OBJECTIVE IMPAIRMENTS: Abnormal gait, decreased activity tolerance, decreased balance, difficulty walking, decreased strength, and obesity.   ACTIVITY LIMITATIONS: locomotion level  PARTICIPATION LIMITATIONS: driving, shopping, and community activity  PERSONAL  FACTORS: Age, Education, Fitness, Past/current experiences, Social background, and Time since onset of injury/illness/exacerbation are also affecting patient's functional outcome.   REHAB POTENTIAL: Good  CLINICAL DECISION MAKING: Evolving/moderate complexity  EVALUATION COMPLEXITY: Moderate   GOALS: Goals reviewed with patient? Yes  SHORT TERM GOALS: Target date: 09/27/2022    Will be compliant with appropriate progressive HEP  Baseline: Goal status: REVISED (refer to LTG #1 below)  09/25/22 - patient selectively compliant with HEP  2.  MMT to be 5/5 globally  Baseline:  Goal status: REVISED (refer to LTG #2 below)  09/25/22 - refer to above MMT table  3.  Will report 50% improvement in proprioception and 50%  improvement in feelings of R LE "being awake"  Baseline:  Goal status: MET  09/25/22 - pt reports 50-60% improvement  4.  ABC score to improve by 5 points to show subjective progress  Baseline: 90.9% Goal status: REVISED (refer to LTG #3 below)  09/25/22 - 1440 / 1600 = 90.0 %  5.  Will ambulate 600 ft in with minimal SOB  Baseline: 505 ft with moderate SOB Goal status: IN PROGRESS  09/25/22 - 581 ft with mild SOB  LONG TERM GOALS: Target date: 11/06/2022  1.  Patient will be independent with ongoing/advanced HEP for self-management at home Baseline:  Goal status: IN PROGRESS   2.  Patient will demonstrate improved B LE strength to >/= 4+/5 for improved stability and ease of mobility  Baseline: Refer to above MMT table Goal status: IN PROGRESS   3.  Patient will improve ABC scale score by >/5 points to demonstrate improved balance confidence. Baseline: 90.0% Goal status: IN PROGRESS  4.  Patient will improve Berg score to >/= 52/56 to improve safety and stability with ADLs in standing and reduce risk for falls Baseline: 46/56 Goal status: IN PROGRESS  5.  Patient will report improved stability when "passing the peace" in church w/o need for extended hold on  other person's hand due to improved balance. Baseline:  Goal status: IN PROGRESS    PLAN:  PT FREQUENCY: 1x/week  PT DURATION: 4-6 weeks (4 visits)  PLANNED INTERVENTIONS: Therapeutic exercises, Therapeutic activity, Neuromuscular re-education, Balance training, Gait training, Patient/Family education, Self Care, Stair training, Manual therapy, and Re-evaluation  PLAN FOR NEXT SESSION: Static and dynamic balance progression; progress R foot and ankle strengthening and proprioceptive/balance training; proximal LE strengthening; HEP updates for exercise progression as pt requests   Marry Guan, PT  10/12/22 1:09 PM

## 2022-10-18 ENCOUNTER — Ambulatory Visit: Payer: Medicare HMO | Attending: Cardiovascular Disease

## 2022-10-18 DIAGNOSIS — Z5181 Encounter for therapeutic drug level monitoring: Secondary | ICD-10-CM | POA: Diagnosis not present

## 2022-10-18 DIAGNOSIS — Z952 Presence of prosthetic heart valve: Secondary | ICD-10-CM

## 2022-10-18 DIAGNOSIS — E119 Type 2 diabetes mellitus without complications: Secondary | ICD-10-CM | POA: Diagnosis not present

## 2022-10-18 LAB — POCT INR: INR: 3.7 — AB (ref 2.0–3.0)

## 2022-10-18 NOTE — Patient Instructions (Signed)
Description   HOLD today's dose and then continue taking 1 tablet daily except 1/2 tablet on Mondays, Wednesdays, and Fridays.  Continue consistency with leafy green intake 3 servings/week.  Recheck INR in 2 weeks.  [facility] [phone].

## 2022-10-18 NOTE — Progress Notes (Signed)
HPI: Followup of coronary artery disease. Patient is status post coronary artery bypassing graft (LIMA to the LAD, saphenous vein graft to the marginal, saphenous vein graft sequentially to the right coronary artery and distal circumflex) as well as aortic valve replacement (St. Jude aortic valve) in 2005. He also had a septal myectomy at that time. Abdominal CT April 2015 showed no aneurysm. Had CVA May 2023. Patient had non-ST elevation myocardial infarction September 2023 in [place].  Patient presented with atrial fibrillation at that time.  Cardiac catheterization revealed severe three-vessel coronary artery disease; patent LIMA to the LAD with 80% stenosis in the very distal LAD; occluded saphenous vein graft to the left circumflex/right coronary artery and occluded saphenous vein graft to the OM; treated medically. CTA October 2023 showed 4.3 cm ascending aortic aneurysm, 7 mm right lower lobe pulmonary nodule with follow-up recommended 6 to 12 months.  Patient had CVA December 2023.  CTA showed occlusion of left M1.  He had mechanical thrombectomy. Transesophageal echocardiogram February 2024 showed normal LV function, mild left atrial enlargement, mild right atrial enlargement, mild mitral regurgitation, status post aortic valve replacement with trace aortic insufficiency, mean gradient 10.4 mmHg, dilated ascending aorta at 45 mm, moderate aortic plaque.  Had recurrent CVA March 2024.  INR was noted to be 2.2.  Brain MRI showed tiny cortical infarct of the left frontal operculum.  Carotid Dopplers March 2024 showed 1 to 39% bilateral stenosis.  Echocardiogram March 2024 showed ejection fraction 60 to 65%, trace mitral regurgitation status post AVR with gradients not measured.  Goal INR was increased to 3-3.5.  Since last seen, patient denies dyspnea on exertion, orthopnea, PND, chest pain or syncope.  He has chronic mild pedal edema.  Current Outpatient Medications  Medication Sig Dispense  Refill   AMBULATORY NON FORMULARY MEDICATION cpap cushions            AirFit F20 (Size: Large)           AirFit F30 (Size: Med)   Please FAX the prescription to:  [phone] 12 each prn   aspirin EC 81 MG tablet Take 1 tablet (81 mg total) by mouth daily. Swallow whole. 30 tablet 12   betamethasone dipropionate (DIPROLENE) 0.05 % cream Apply topically 2 (two) times daily as needed. To eczema rash (Patient taking differently: Apply 1 application  topically 2 (two) times daily as needed (to eczema/rash).) 30 g 1   Blood Glucose Monitoring Suppl Supplies MISC Use for monitoring glucose level 100 each 1   colchicine 0.6 MG tablet TAKE 1 TABLET (0.6 MG TOTAL) BY MOUTH 2 (TWO) TIMES DAILY AS NEEDED. 180 tablet 0   Continuous Blood Gluc Sensor (DEXCOM G6 SENSOR) MISC 1 Device by Does not apply route as directed. 9 each 3   dapagliflozin propanediol (FARXIGA) 10 MG TABS tablet Take 1 tablet (10 mg total) by mouth daily before breakfast. 30 tablet 0   erythromycin ophthalmic ointment 1 Application See admin instructions. Apply to affected eye(s) as directed for styes     fenofibrate (TRICOR) 145 MG tablet TAKE 1 TABLET BY MOUTH EVERY DAY 90 tablet 1   furosemide (LASIX) 40 MG tablet Take 1 tablet (40 mg total) by mouth daily. 90 tablet 3   glucose blood (ONETOUCH VERIO) test strip Use as instructed 100 strip 12   insulin aspart (NOVOLOG FLEXPEN) 100 UNIT/ML FlexPen Max daily 75 units (Patient taking differently: Inject 14-19 Units into the skin See admin instructions. Inject 16 units into  the skin with breakfast, 14 units with lunch, and 16 units with supper/evening meal- may increase to up to 19 units per dose as needed for high blood sugar) 75 mL 3   Insulin Glargine (BASAGLAR KWIKPEN) 100 UNIT/ML Inject 22 Units into the skin daily. (Patient taking differently: Inject 22 Units into the skin at bedtime.) 30 mL 3   Insulin Pen Needle 32G X 4 MM MISC 1 Device by Does not apply route in the morning, at  noon, in the evening, and at bedtime. 400 each 3   isosorbide mononitrate (IMDUR) 30 MG 24 hr tablet TAKE 1 TABLET BY MOUTH EVERY DAY 90 tablet 2   ketoconazole (NIZORAL) 2 % cream Apply 1 Application topically daily as needed for irritation.     metoprolol succinate (TOPROL-XL) 25 MG 24 hr tablet TAKE 1 TABLET BY MOUTH EVERY DAY 90 tablet 1   OneTouch Delica Lancets 33G MISC USE AS DIRECTED 100 each 1   PRESCRIPTION MEDICATION CPAP- At bedtime     rosuvastatin (CRESTOR) 40 MG tablet TAKE 1 TABLET BY MOUTH EVERY DAY 90 tablet 3   Semaglutide,0.25 or 0.5MG /DOS, 2 MG/3ML SOPN Inject 0.5 mg into the skin once a week. 9 mL 3   tacrolimus (PROTOPIC) 0.1 % ointment Apply 1 application  topically 2 (two) times daily as needed (for eczema).     tamsulosin (FLOMAX) 0.4 MG CAPS capsule Take 1 capsule (0.4 mg total) by mouth daily. 30 capsule 0   warfarin (COUMADIN) 7.5 MG tablet TAKE 1/2 TABLET TO 1 TABLET BY MOUTH DAILY AS DIRECTED BY THE COUMADIN CLINIC 105 tablet 1   clopidogrel (PLAVIX) 75 MG tablet Take 75 mg by mouth daily. (Patient not taking: Reported on 11/01/2022)     pantoprazole (PROTONIX) 40 MG tablet Take 1 tablet (40 mg total) by mouth daily. (Patient not taking: Reported on 11/01/2022) 30 tablet 1   potassium chloride SA (KLOR-CON M) 20 MEQ tablet Take 1 tablet (20 mEq total) by mouth 2 (two) times daily. (Patient not taking: Reported on 11/01/2022) 2 tablet 0   No current facility-administered medications for this visit.     Past Medical History:  Diagnosis Date   Actinic keratosis 02/14/2011   Allergy    Aneurysm of ascending aorta without rupture (HCC) 01/06/2022   Aortic stenosis    Arthritis    Bilateral sensorineural hearing loss 06/16/2009   CAD (coronary artery disease)    cabg   Complication of anesthesia    Coronary atherosclerosis of native coronary artery 06/15/2009   Diabetic polyneuropathy 11/11/2018   Dyslipidemia 11/05/2018   Embolic stroke involving left middle  cerebral artery  05/30/2021   Erectile dysfunction 05/18/2009   Essential hypertension 06/16/2009   Expressive aphasia 06/25/2021   External hemorrhoids 03/21/2010   Gout 04/09/2015   Heart murmur    Hemochromatosis 02/14/2011   History of aortic valve replacement 10/13/2009   History of CABG 11/05/2018   2005- 1. Coronary artery bypass grafting x4 with LIMA-LAD, SVG-OM1, SVG-AM and dCFX.  2. Aortic valve replacement with St Jude AVR 3. Septal myomectomy.  4. Myoview low risk 2016   History of hepatitis 01/31/1972   Hypertriglyceridemia 06/13/2010   Hypertrophic obstructive cardiomyopathy 05/06/2008   SP septal myomectomy at surgery 2005 Echo Nov 2018- Hyperdynamic LVEF with severe basal septal hypertrophy. There is chordal SAM with resting gradient of 16 mmHg that increases to 85 mmHg with Valsalva        Leg weakness, bilateral 01/18/2021   Long term (  current) use of anticoagulants 05/22/2018   Lumbar stenosis with neurogenic claudication 02/04/2021   Malignant neoplasm of prostate 06/17/2009   Mechanical heart valve present    Manufacturer: [employer] Duty #: [id number]  Model #: [id number]. Card states MRI compatible with 3 teslas or less.   Obesity, unspecified 04/29/2008   OSA (obstructive sleep apnea) 07/12/2012   moderate-severe; uses CPAP nightly   PONV (postoperative nausea and vomiting)    after CABG- slow to wake up   Presence of prosthetic heart valve    Primary osteoarthritis of left shoulder 10/31/2018   S/P lumbar laminectomy 02/04/2021   Stage 3 chronic kidney disease due to type 2 diabetes mellitus 11/11/2018   Tobacco use 06/16/2009   Type 2 diabetes mellitus with hyperglycemia    Ventral hernia 03/03/2014    Past Surgical History:  Procedure Laterality Date   AORTIC VALVE REPLACEMENT  11/14/2003   [facility]   APPENDECTOMY  1990   CHOLECYSTECTOMY  1990   CORONARY ANGIOGRAPHY  10/11/2021   CORONARY ARTERY BYPASS GRAFT  10/2003   HERNIA REPAIR  1999    right, inguinal   HERNIA REPAIR  2002   left, inguinal   HIP SURGERY  2006   right hip   IR CT HEAD LTD  02/03/2022   IR PERCUTANEOUS ART THROMBECTOMY/INFUSION INTRACRANIAL INC DIAG ANGIO  01/24/2022   LUMBAR LAMINECTOMY/DECOMPRESSION MICRODISCECTOMY Bilateral 02/04/2021   Procedure: Bilateral Lumbar Two-Three Laminectomy;  Surgeon: [name], MD;  Location: [facility] OR;  Service: Neurosurgery;  Laterality: Bilateral;  3C/RM 20   PILONIDAL CYST EXCISION  1964   prostate seed implant  03/2010   RADIOLOGY WITH ANESTHESIA N/A 01/24/2022   Procedure: IR WITH ANESTHESIA;  Surgeon: Radiologist, Medication, MD;  Location: [facility] OR;  Service: Radiology;  Laterality: N/A;   TEE WITHOUT CARDIOVERSION N/A 03/14/2022   Procedure: TRANSESOPHAGEAL ECHOCARDIOGRAM (TEE);  Surgeon: Parke Poisson, MD;  Location: [facility] ENDOSCOPY;  Service: Cardiology;  Laterality: N/A;   TONSILLECTOMY  childhood    Social History   Socioeconomic History   Marital status: Married    Spouse name: Not on file   Number of children: Not on file   Years of education: 49   Highest education level: Master's degree (e.g., MA, MS, MEng, MEd, MSW, MBA)  Occupational History   Occupation: Retired  Tobacco Use   Smoking status: Some Days    Types: Cigars    Last attempt to quit: 06/2021    Years since quitting: 1.3   Smokeless tobacco: Never   Tobacco comments:    Pt states he smokes 1 cigar about once a month. 06/01/2021    Pt quit  Cigars 06/2021  Substance and Sexual Activity   Alcohol use: Not Currently    Comment: wine maybe 1 per month   Drug use: No   Sexual activity: Never  Other Topics Concern   Not on file  Social History Narrative   ** Merged History Encounter ** Right Handed    Lives in a one sotyr home. One step leads to front     Lives with wife   Retired   Occasionally caffeine   Social Determinants of Health   Financial Resource Strain: Low Risk  (08/01/2022)   Overall Financial Resource Strain (CARDIA)     Difficulty of Paying Living Expenses: Not hard at all  Food Insecurity: No Food Insecurity (08/01/2022)   Hunger Vital Sign    Worried About Running Out of Food in the Last  Year: Never true    Ran Out of Food in the Last Year: Never true  Transportation Needs: No Transportation Needs (08/01/2022)   PRAPARE - Administrator, Civil Service (Medical): No    Lack of Transportation (Non-Medical): No  Physical Activity: Insufficiently Active (08/01/2022)   Exercise Vital Sign    Days of Exercise per Week: 3 days    Minutes of Exercise per Session: 30 min  Stress: No Stress Concern Present (08/01/2022)   [employer] of Occupational Health - Occupational Stress Questionnaire    Feeling of Stress : Not at all  Social Connections: Moderately Integrated (08/01/2022)   Social Connection and Isolation Panel [NHANES]    Frequency of Communication with Friends and Family: Twice a week    Frequency of Social Gatherings with Friends and Family: Once a week    Attends Religious Services: More than 4 times per year    Active Member of [employer] or Organizations: No    Attends Banker Meetings: Never    Marital Status: Married  Catering manager Violence: Not At Risk (08/02/2022)   Humiliation, Afraid, Rape, and Kick questionnaire    Fear of Current or Ex-Partner: No    Emotionally Abused: No    Physically Abused: No    Sexually Abused: No    Family History  Problem Relation Age of Onset   Heart disease Mother    Stroke Mother    Heart disease Father    Asperger's syndrome Son    Hyperlipidemia Son    Coronary artery disease Brother    Cancer Neg Hx        negative for colon cancer    ROS: no fevers or chills, productive cough, hemoptysis, dysphasia, odynophagia, melena, hematochezia, dysuria, hematuria, rash, seizure activity, orthopnea, PND, pedal edema, claudication. Remaining systems are negative.  Physical Exam: Well-developed well-nourished in no acute distress.   Skin is warm and dry.  HEENT is normal.  Neck is supple.  Chest is clear to auscultation with normal expansion.  Cardiovascular exam is regular rate and rhythm.  Crisp mechanical valve sound.  No diastolic murmur noted. Abdominal exam nontender or distended. No masses palpated. Extremities show trace edema. neuro grossly intact  A/P  1 prior aortic valve replacement-continue SBE prophylaxis.  Goal INR has been increased to 3-3.5 given history of recurrent CVAs.  2 history of recurrent CVA-previous transesophageal echocardiogram showed normally functioning valve.  Will continue Coumadin with goal INR 3-3.5 as well as aspirin.  3 lung nodule-we will arrange follow-up chest CT.  4 thoracic aortic aneurysm-plan follow-up CTA.  Note he does have baseline renal insufficiency.  I will hold his Lasix 2 days prior to the CTA as well as the day of.  Check potassium, renal function 1 week after the procedure.  5 hyperlipidemia-continue statin.  Check Budzyn liver.  6 paroxysmal atrial fibrillation-patient remains in sinus rhythm on exam.  Continue beta-blocker and Coumadin.  Can consider antiarrhythmic in the future if needed.  7 hypertension-patient's blood pressure is controlled.  Continue present medical regimen.  8 history of hypertrophic cardiomyopathy-status post myectomy.  Continue beta-blocker.  9 coronary artery disease-patient is not having chest pain.  Continue statin.  As outlined previously I reviewed his films from [place] with Dr. Tresa Endo and medical therapy felt to be the best option.  Previous infarct was felt secondary to atrial fibrillation with rapid ventricular response superimposed on underlying coronary disease.  10 pedal edema-continue Lasix at present dose.  Olga Millers,  MD

## 2022-10-19 ENCOUNTER — Encounter: Payer: Medicare HMO | Admitting: Physical Therapy

## 2022-10-19 ENCOUNTER — Encounter: Payer: Self-pay | Admitting: Family

## 2022-10-19 DIAGNOSIS — M25512 Pain in left shoulder: Secondary | ICD-10-CM

## 2022-10-22 DIAGNOSIS — G4733 Obstructive sleep apnea (adult) (pediatric): Secondary | ICD-10-CM | POA: Diagnosis not present

## 2022-10-22 DIAGNOSIS — I10 Essential (primary) hypertension: Secondary | ICD-10-CM | POA: Diagnosis not present

## 2022-10-23 ENCOUNTER — Ambulatory Visit (HOSPITAL_BASED_OUTPATIENT_CLINIC_OR_DEPARTMENT_OTHER)
Admission: RE | Admit: 2022-10-23 | Discharge: 2022-10-23 | Disposition: A | Discharge status: Home / Self Care | Payer: Medicare HMO | Source: Ambulatory Visit | Attending: Family | Admitting: Family

## 2022-10-23 DIAGNOSIS — M25512 Pain in left shoulder: Secondary | ICD-10-CM | POA: Insufficient documentation

## 2022-10-23 DIAGNOSIS — M19012 Primary osteoarthritis, left shoulder: Secondary | ICD-10-CM | POA: Diagnosis not present

## 2022-10-26 ENCOUNTER — Ambulatory Visit: Payer: Medicare HMO | Admitting: Physical Therapy

## 2022-10-26 ENCOUNTER — Encounter: Payer: Self-pay | Admitting: Physical Therapy

## 2022-10-26 DIAGNOSIS — M6281 Muscle weakness (generalized): Secondary | ICD-10-CM | POA: Diagnosis not present

## 2022-10-26 DIAGNOSIS — R2681 Unsteadiness on feet: Secondary | ICD-10-CM

## 2022-10-26 NOTE — Therapy (Signed)
OUTPATIENT PHYSICAL THERAPY TREATMENT    Patient Name: [name] MRN: [id number] DOB:11/23/1945, 77 y.o., male Today's Date: 10/26/2022  END OF SESSION:  PT End of Session - 10/26/22 1025     Visit Number 7    Date for PT Re-Evaluation 11/06/22    Authorization Type [employer] MCR    Progress Note Due on Visit 10    PT Start Time 1025    PT Stop Time 1107    PT Time Calculation (min) 42 min    Activity Tolerance Patient tolerated treatment well    Behavior During Therapy [facility] for tasks assessed/performed;Impulsive                 Past Medical History:  Diagnosis Date   Actinic keratosis 02/14/2011   Allergy    Aneurysm of ascending aorta without rupture (HCC) 01/06/2022   Aortic stenosis    Arthritis    Bilateral sensorineural hearing loss 06/16/2009   CAD (coronary artery disease)    cabg   Complication of anesthesia    Coronary atherosclerosis of native coronary artery 06/15/2009   Diabetic polyneuropathy 11/11/2018   Dyslipidemia 11/05/2018   Embolic stroke involving left middle cerebral artery  05/30/2021   Erectile dysfunction 05/18/2009   Essential hypertension 06/16/2009   Expressive aphasia 06/25/2021   External hemorrhoids 03/21/2010   Gout 04/09/2015   Heart murmur    Hemochromatosis 02/14/2011   History of aortic valve replacement 10/13/2009   History of CABG 11/05/2018   2005- 1. Coronary artery bypass grafting x4 with LIMA-LAD, SVG-OM1, SVG-AM and dCFX.  2. Aortic valve replacement with [employer] AVR 3. Septal myomectomy.  4. Myoview low risk 2016   History of hepatitis 01/31/1972   Hypertriglyceridemia 06/13/2010   Hypertrophic obstructive cardiomyopathy 05/06/2008   SP septal myomectomy at surgery 2005 Echo Nov 2018- Hyperdynamic LVEF with severe basal septal hypertrophy. There is chordal SAM with resting gradient of 16 mmHg that increases to 85 mmHg with Valsalva        Leg weakness, bilateral 01/18/2021   Long term (current) use of  anticoagulants 05/22/2018   Lumbar stenosis with neurogenic claudication 02/04/2021   Malignant neoplasm of prostate 06/17/2009   Mechanical heart valve present    Manufacturer: Theresa Duty #: [id number]  Model #: [id number]. Card states MRI compatible with 3 teslas or less.   Obesity, unspecified 04/29/2008   OSA (obstructive sleep apnea) 07/12/2012   moderate-severe; uses CPAP nightly   PONV (postoperative nausea and vomiting)    after CABG- slow to wake up   Presence of prosthetic heart valve    Primary osteoarthritis of left shoulder 10/31/2018   S/P lumbar laminectomy 02/04/2021   Stage 3 chronic kidney disease due to type 2 diabetes mellitus 11/11/2018   Tobacco use 06/16/2009   Type 2 diabetes mellitus with hyperglycemia    Ventral hernia 03/03/2014   Past Surgical History:  Procedure Laterality Date   AORTIC VALVE REPLACEMENT  11/14/2003   [facility]   APPENDECTOMY  1990   CHOLECYSTECTOMY  1990   CORONARY ANGIOGRAPHY  10/11/2021   CORONARY ARTERY BYPASS GRAFT  10/2003   HERNIA REPAIR  1999   right, inguinal   HERNIA REPAIR  2002   left, inguinal   HIP SURGERY  2006   right hip   IR CT HEAD LTD  02/03/2022   IR PERCUTANEOUS ART THROMBECTOMY/INFUSION INTRACRANIAL INC DIAG ANGIO  01/24/2022   LUMBAR LAMINECTOMY/DECOMPRESSION MICRODISCECTOMY Bilateral 02/04/2021   Procedure: Bilateral  Lumbar Two-Three Laminectomy;  Surgeon: [name], MD;  Location: [facility] OR;  Service: Neurosurgery;  Laterality: Bilateral;  3C/RM 20   PILONIDAL CYST EXCISION  1964   prostate seed implant  03/2010   RADIOLOGY WITH ANESTHESIA N/A 01/24/2022   Procedure: IR WITH ANESTHESIA;  Surgeon: Radiologist, Medication, MD;  Location: [facility] OR;  Service: Radiology;  Laterality: N/A;   TEE WITHOUT CARDIOVERSION N/A 03/14/2022   Procedure: TRANSESOPHAGEAL ECHOCARDIOGRAM (TEE);  Surgeon: Parke Poisson, MD;  Location: [facility] ENDOSCOPY;  Service: Cardiology;  Laterality: N/A;   TONSILLECTOMY  childhood    Patient Active Problem List   Diagnosis Date Noted   Hypertrophic cardiomyopathy (HCC) 08/12/2022   Chronic anticoagulation 08/12/2022   Hypotension 04/27/2022   H/O: CVA (cerebrovascular accident) 03/14/2022   Urinary retention 01/26/2022   Acute ischemic stroke (HCC) 01/24/2022   Middle cerebral artery embolism, left 01/24/2022   Aneurysm of ascending aorta without rupture (HCC) 01/06/2022   Type 2 diabetes mellitus with stage 3a chronic kidney disease, with long-term current use of insulin (HCC) 10/30/2021   Diabetes mellitus (HCC) 10/30/2021   Type 2 diabetes mellitus with diabetic polyneuropathy, with long-term current use of insulin (HCC) 10/30/2021   NSTEMI (non-ST elevated myocardial infarction) (HCC) 10/09/2021   Paroxysmal atrial fibrillation (HCC) 10/09/2021   Presence of prosthetic heart valve    Type 2 diabetes mellitus with hyperglycemia    Expressive aphasia 06/25/2021   CVA (cerebral vascular accident) (HCC) 05/2021   S/P lumbar laminectomy 02/04/2021   Lumbar stenosis with neurogenic claudication 02/04/2021   History of falling 01/30/2021   Diabetic polyneuropathy 11/11/2018   Stage 3 chronic kidney disease due to type 2 diabetes mellitus 11/11/2018   History of CABG 11/05/2018   Dyslipidemia 11/05/2018   Primary osteoarthritis of left shoulder 10/31/2018   Long term (current) use of anticoagulants 05/22/2018   Ventral hernia 03/03/2014   OSA (obstructive sleep apnea) 07/12/2012   Hemochromatosis 02/14/2011   Actinic keratosis 02/14/2011   Hypertriglyceridemia 06/13/2010   External hemorrhoids 03/21/2010   History of aortic valve replacement 10/13/2009   Malignant neoplasm of prostate (HCC) 06/17/2009   Bilateral sensorineural hearing loss 06/16/2009   Essential hypertension 06/16/2009   Coronary atherosclerosis of native coronary artery 06/15/2009   Erectile dysfunction 05/18/2009   Obesity, unspecified 04/29/2008    PCP: Sandford Craze,  NP  REFERRING PROVIDER: Sandford Craze, NP  REFERRING DIAG: [phone] (ICD-10-CM) - H/O: CVA (cerebrovascular accident)  THERAPY DIAG:  Muscle weakness (generalized)  Unsteadiness on feet  RATIONALE FOR EVALUATION AND TREATMENT: Rehabilitation  ONSET DATE: CVA in December 2023  NEXT MD VISIT: 11/13/22   SUBJECTIVE:   SUBJECTIVE STATEMENT: Pt reports his balance seems to be getting better, but thinks his neuropathy is getting worse - having a harder time lifting his R leg in/out of the car or pressing the gas and brake while driving. He notes a fall a week and a half ago when he tripped when he failed his lift his R foot high enough to transition onto the patio - he reports he hit his leg but no other injury.  EVAL: Had a stroke in December and it affected my right side. I do have some residual things going on- tremor when I'm trying to use my right hand, working on residual effects from bladder, and I'd like PT to work on my right foot "It feels like its waking up". Having a hard time feeling brake and gas pedals with my right foot. My balance is good. Have  some cracking from my knee sometimes from OA. Feel like strength could be an issue, when the foot is waking up there is some weakness. Have been trying to wake it up in the gym. They think that my INR levels are causing the strokes, working on getting it in an exact range to prevent more CVAs. I will use my walker if I'm going far especially in the heat.   PERTINENT HISTORY: Aneurysm of ascending aorta, aortic stenosis, B sensorineural hearing loss - extremely HOH, CAD s/p CABG, cardiomyopathy, AVR, chronic anticoagulation, DM-II with neuropathy, CVA of MCA, HTN, expressive aphasia, gout, lumbar stenosis s/p lumbar laminectomy and decompression 2023, shoulder OA, CKD, ventral hernia, hernia repair, hip surgery, hx multiple strokes (3-4), prostate cancer  PAIN:  Are you having pain? No 0/10, "don't really have pain"   PRECAUTIONS:  None  WEIGHT BEARING RESTRICTIONS: No  FALLS:  Has patient fallen in last 6 months? No  LIVING ENVIRONMENT: Lives with: lives with their family Lives in: House/apartment Stairs: No Has following equipment at home: Environmental consultant - 2 wheeled, Crutches, and Grab bars  OCCUPATION: retired   PLOF: Independent, Independent with basic ADLs, Independent with gait, and Independent with transfers  PATIENT GOALS: "get my right leg to wake up "     OBJECTIVE:   DIAGNOSTIC FINDINGS:  04/09/22 - MRI Brain IMPRESSION: 1. Positive for a tiny acute cortical infarct of the left frontal operculum, within an area of chronic Left MCA middle division infarct with encephalomalacia and laminar necrosis. No acute hemorrhage or mass effect. 2. Conspicuous increased intrinsic T1 signal in the left lentiform nuclei since December. Nonspecific, but favor this is a form of Wallerian degeneration secondary to the chronic ischemia in #1. 3. Elsewhere stable noncontrast MRI appearance of the brain since last year., including chronic bilateral cerebellar infarcts and occasional punctate chronic microhemorrhages.  04/09/22 - CT Head: IMPRESSION: 1. No acute intracranial abnormality. 2. Chronic left MCA and cerebellar infarcts.  PATIENT SURVEYS:  ABC scale 90.9% ; (09/25/22 - 90.0 %)  COGNITION: Overall cognitive status: Within functional limits for tasks assessed     SENSATION: "Feels like R LE is still waking up"   LOWER EXTREMITY MMT:  MMT Right eval Left eval R 09/13/22 L 09/13/22 R 09/25/22 L 09/25/22 R 10/26/22 L 10/26/22  Hip flexion 4- 4+ 4- 4+ 4 5 4 5   Hip extension   3+ 4 4 4+ 4 4+  Hip abduction 4+ 5 4- 4 4 4+ 4 4  Hip adduction   4- 4 4 4+ 4 4+  Hip internal rotation   3+ 5 4 5  4+ 5  Hip external rotation   3+ 4 4- 4+ 4 4+  Knee flexion 4 4   5 5 5 5   Knee extension 4+ 4+   5 5 5 5   Ankle dorsiflexion 5 5   4+ 4 4+ 5  Ankle plantarflexion     4+  15 SLS HR 5 5 5   Ankle inversion     4 4+ 4 5   Ankle eversion     4 4+ 4 4+   (Blank rows = not tested)  FUNCTIONAL TESTS:  3 minute walk test: 505 ft no device, mild unsteadiness on turns, moderate SOB by end of gait period  Able to hold SLS R LE 30 seconds, tandem stance with R foot in the back x30 seconds   09/25/22:  : 581 ft w/o AD, mild SOB Berg: 46/56; 46-51 moderate risk for  falls (>50%)  GAIT: Distance walked: in clinic distances  Assistive device utilized: None Level of assistance: Complete Independence Comments: varus knees at baseline, mild unsteadiness on turns    TODAY'S TREATMENT:                                                                                                                              DATE:   10/26/22 THERAPEUTIC EXERCISE: to improve flexibility, strength and mobility.  Demonstration, verbal and tactile cues throughout for technique.  Standing R/L hip ABD with looped RTB at ankles x 10, UE support on back of chair for balance Standing R/L hip extension with looped RTB at ankles x 10, UE support on back of chair for balance Standing R/L hip flexion march with looped RTB at midfeet x 10, UE support on back of chair for balance Standing R/l hip flexion & ER x 10 Seated R ankle eversion & DF with looped RTB x 10 Verbal review of remainder of HEP with demonstration as needed  THERAPEUTIC ACTIVITIES: LE MMT Initiation of LTG assessment   10/12/22 THERAPEUTIC EXERCISE: to improve flexibility, strength and mobility.  Demonstration, verbal and tactile cues throughout for technique. TM - 2.0 mph x 4 min  NEUROMUSCULAR RE-EDUCATION: To improve balance, proprioception, coordination, and reduce fall risk.  Alt fwd step + contralateral reach to cone atop 36"FR x 20 Corner balance progression with 3/4 tandem stance, staggered stance, and tandem stance (1 set with each foot fwd) on firm surface with arms crossed on chest (SBA/CGA of PT for safety)            Eyes open: - static stance 2 x 30 sec  (tandem stance) - horiz head turns x 5 (tandem stance) - vertical head nods x 5 (3/4 tandem stance, staggered stance) - trunk rotation x 5 Eyes closed: (3/4 tandem stance) - static stance x 30 sec - horiz head turns x 5 - vertical head nods x 5 - trunk rotation x 5 Corner balance progression with right SLS and left SLS on firm surface with arms wide, arms at sides, and intermittent 1-2 finger support on back of chair  (SBA/CGA of PT for safety)            Eyes open: - static stance 3 x 30 sec Alt toe clears from Airex pad to 9" stool (standing in corner for safety) x 20 - more difficulty on R   09/25/22 - Recert THERAPEUTIC ACTIVITIES: ABC scale = 1440 / 1600 = 90.0 % LE MMT : 581 ft w/o AD, mild SOB Berg: 46/56; 46-51 moderate risk for falls (>50%)  Goal assessment    PATIENT EDUCATION:  Education details: HEP review, HEP update - standing hip flexion + ER, and scheduling of HEP exercises to perform some exercise home on days he does not go to gym    Person educated: Patient Education method: Explanation Education comprehension: verbalized understanding  HOME EXERCISE PROGRAM: Access Code: [email or web address]  URL: [email or web address] Date: 10/26/2022 Prepared by: Glenetta Hew  Exercises - Seated Ankle Dorsiflexion Stretch  - 2 x daily - 7 x weekly - 1 sets - 10 reps - 5 seconds  hold - Supine Bridge  - 2 x daily - 7 x weekly - 1 sets - 10 reps - 2 seconds  hold - Eccentric Heel Lowering on Step (Mirrored)  - 1 x daily - 7 x weekly - 2 sets - 10 reps - 3-5 sec hold - Eccentric Single Heel Raises with Leg Press  2 Up, 1 Down  (Mirrored)  - 1 x daily - 7 x weekly - 2 sets - 10 reps - 3 sec hold - Isometric Ankle Inversion at Wall  - 1 x daily - 7 x weekly - 2 sets - 10 reps - 3-5 sec hold - Isometric Ankle Eversion at Wall  - 1 x daily - 7 x weekly - 2 sets - 10 reps - 3-5 sec hold - Seated Toe Curl  - 1 x daily - 7 x weekly - 2 sets - 10 reps - 3 sec hold - Towel  Scrunches  - 2-3 x daily - 7 x weekly - 2 sets - 15 reps - Seated Lesser Toes Extension  - 1 x daily - 7 x weekly - 2 sets - 10 reps - 3 sec hold - Toe Spreading  - 1 x daily - 7 x weekly - 2 sets - 10 reps - 3 sec hold - Seated Ankle Dorsiflexion with Resistance  - 1 x daily - 7 x weekly - 2 sets - 10 reps - 3 sec hold - Ankle Inversion with Anchored Resistance at Table  - 1 x daily - 7 x weekly - 2 sets - 10 reps - 3 sec hold - Seated Ankle Eversion with Resistance  - 1 x daily - 7 x weekly - 2 sets - 10 reps - 3 sec hold - Clam with Resistance  - 1 x daily - 3 x weekly - 2 sets - 10 reps - 3-5 sec hold - Standing Hip Abduction with Resistance at Ankles and Counter Support  - 1 x daily - 3 x weekly - 2 sets - 10 reps - 3 sec hold - Standing Hip Extension with Resistance at Ankles and Counter Support  - 1 x daily - 3 x weekly - 2 sets - 10 reps - 3 sec hold - Marching with Resistance  - 1 x daily - 3 x weekly - 2 sets - 10 reps - 3 sec hold - Sit to Stand with Resistance Around Legs  - 1 x daily - 3 x weekly - 2 sets - 10 reps - Standing Single Leg Hip ER   - 1 x daily - 3 x weekly - 2 sets - 10 reps - 3 sec hold   ASSESSMENT:  CLINICAL IMPRESSION: David "Len" reports a fall in his yard on 10/15/22 when he tripped transitioning from the yard onto the patio.  He notes some bruising on the R lower leg but otherwise denies any injury.  He complains of worsening of his neuropathy and notes increased difficulty lifting his right LE in another car and maneuvering from the gas to the brake when driving.  MMT revealing only slight improvement from last testing 1 month ago, with main areas of weakness still being in R lateral ankle control as well as hip strength.  Review of relevant HEP strengthening exercises revealing patient has not been performing  most of these exercises, and in some cases performing the movements that without the added resistance of the Thera-Band.  Hip strengthening exercises  performed with RTB resistance with PT providing clarification of desired movement patterns and avoidance of substitution motion, with PT reiterated to patient that these exercises can be performed at home on the days he does not go to the gym.  Visually and verbally reviewed remainder of HEP providing clarification and explanation of importance of each exercise, again reinforcing that not all exercises need to be done daily and that he can rotate through the exercises over the course of the week. Len has 1 scheduled visit remaining in his current POC, therefore will plan to complete remaining goal assessment next visit to determine readiness for transition to HEP vs need for recert.   OBJECTIVE IMPAIRMENTS: Abnormal gait, decreased activity tolerance, decreased balance, difficulty walking, decreased strength, and obesity.   ACTIVITY LIMITATIONS: locomotion level  PARTICIPATION LIMITATIONS: driving, shopping, and community activity  PERSONAL FACTORS: Age, Education, Fitness, Past/current experiences, Social background, and Time since onset of injury/illness/exacerbation are also affecting patient's functional outcome.   REHAB POTENTIAL: Good  CLINICAL DECISION MAKING: Evolving/moderate complexity  EVALUATION COMPLEXITY: Moderate   GOALS: Goals reviewed with patient? Yes  SHORT TERM GOALS: Target date: 09/27/2022    Will be compliant with appropriate progressive HEP  Baseline: Goal status: REVISED (refer to LTG #1 below)  09/25/22 - patient selectively compliant with HEP  2.  MMT to be 5/5 globally  Baseline:  Goal status: REVISED (refer to LTG #2 below)  09/25/22 - refer to above MMT table  3.  Will report 50% improvement in proprioception and 50% improvement in feelings of R LE "being awake"  Baseline:  Goal status: MET  09/25/22 - pt reports 50-60% improvement  4.  ABC score to improve by 5 points to show subjective progress  Baseline: 90.9% Goal status: REVISED (refer to LTG #3  below)  09/25/22 - 1440 / 1600 = 90.0 %  5.  Will ambulate 600 ft in with minimal SOB  Baseline: 505 ft with moderate SOB Goal status: IN PROGRESS  09/25/22 - 581 ft with mild SOB  LONG TERM GOALS: Target date: 11/06/2022  1.  Patient will be independent with ongoing/advanced HEP for self-management at home Baseline:  Goal status: IN PROGRESS  10/26/22 - patient only selectively performing HEP and often without recommended Thera-Band resistance  2.  Patient will demonstrate improved B LE strength to >/= 4+/5 for improved stability and ease of mobility  Baseline: Refer to above MMT table Goal status: IN PROGRESS  10/26/22 - continued weakness at R>L hips and R lateral ankle  3.  Patient will improve ABC scale score by >/5 points to demonstrate improved balance confidence. Baseline: 90.0% Goal status: IN PROGRESS  4.  Patient will improve Berg score to >/= 52/56 to improve safety and stability with ADLs in standing and reduce risk for falls Baseline: 46/56 Goal status: IN PROGRESS  5.  Patient will report improved stability when "passing the peace" in church w/o need for extended hold on other person's hand due to improved balance. Baseline:  Goal status: IN PROGRESS    PLAN:  PT FREQUENCY: 1x/week  PT DURATION: 4-6 weeks (4 visits)  PLANNED INTERVENTIONS: Therapeutic exercises, Therapeutic activity, Neuromuscular re-education, Balance training, Gait training, Patient/Family education, Self Care, Stair training, Manual therapy, and Re-evaluation  PLAN FOR NEXT SESSION: Complete remaining goal assessment to determine readiness for transition to HEP vs need  for recert;  static and dynamic balance progression; progress R foot and ankle strengthening and proprioceptive/balance training; proximal LE strengthening; HEP updates for exercise progression as pt requests   Marry Guan, PT  10/26/22 11:16 AM

## 2022-10-30 ENCOUNTER — Encounter: Payer: Self-pay | Admitting: Family

## 2022-10-30 ENCOUNTER — Telehealth: Payer: Self-pay | Admitting: Family

## 2022-10-30 DIAGNOSIS — M19012 Primary osteoarthritis, left shoulder: Secondary | ICD-10-CM

## 2022-10-30 NOTE — Telephone Encounter (Signed)
Reading roomed called and they will try to read asap

## 2022-10-30 NOTE — Telephone Encounter (Signed)
Please call Radiology Reading Room and request read on Shoulder x-ray performed 1 week ago.

## 2022-10-30 NOTE — Telephone Encounter (Signed)
See mychart.  

## 2022-11-01 ENCOUNTER — Ambulatory Visit: Payer: Medicare HMO | Attending: Cardiology | Admitting: Cardiology

## 2022-11-01 ENCOUNTER — Encounter: Payer: Self-pay | Admitting: Cardiology

## 2022-11-01 VITALS — BP 118/60 | HR 65 | Ht 69.0 in | Wt 217.0 lb

## 2022-11-01 DIAGNOSIS — I10 Essential (primary) hypertension: Secondary | ICD-10-CM | POA: Diagnosis not present

## 2022-11-01 DIAGNOSIS — E785 Hyperlipidemia, unspecified: Secondary | ICD-10-CM

## 2022-11-01 DIAGNOSIS — I7121 Aneurysm of the ascending aorta, without rupture: Secondary | ICD-10-CM | POA: Diagnosis not present

## 2022-11-01 NOTE — Patient Instructions (Addendum)
  Lab Work:  Your physician recommends that you return for lab work ONE WEEK AFTER CT SCAN-FASTING=MONDAY 11-13-22  [employer] on the 3 rd floor in [address] Hours-Monday - Friday 8 am-11:30 AM and 1 pm -4 pm  If you have labs (blood work) drawn today and your tests are completely normal, you will receive your results only by: MyChart Message (if you have MyChart) OR A paper copy in the mail If you have any lab test that is abnormal or we need to change your treatment, we will call you to review the results.   Testing/procedures:  CTA OF THE CHEST TO FOLLOW UP THORACIC ANEURYSM AND LUNG NODULE AT THE [facility] FLOOR IMAGING DEPARTMENT-DO NOT TAKE FUROSEMIDE 2 DAYS PRIOR TO CT SCAN AND THE DAY OF THE CT SCAN-RESTART THE DAY AFTER THE CT SCAN   Follow-Up: At [facility], you and your health needs are our priority.  As part of our continuing mission to provide you with exceptional heart care, we have created designated Provider Care Teams.  These Care Teams include your primary Cardiologist (physician) and Advanced Practice Providers (APPs -  Physician Assistants and Nurse Practitioners) who all work together to provide you with the care you need, when you need it.  We recommend signing up for the patient portal called "MyChart".  Sign up information is provided on this After Visit Summary.  MyChart is used to connect with patients for Virtual Visits (Telemedicine).  Patients are able to view lab/test results, encounter notes, upcoming appointments, etc.  Non-urgent messages can be sent to your provider as well.   To learn more about what you can do with MyChart, go to [email or web address].    Your next appointment:   6 month(s)  Provider:   Olga Millers, MD

## 2022-11-02 ENCOUNTER — Ambulatory Visit: Payer: Medicare HMO | Attending: Family | Admitting: Physical Therapy

## 2022-11-02 ENCOUNTER — Ambulatory Visit: Payer: Medicare HMO | Attending: Internal Medicine

## 2022-11-02 ENCOUNTER — Encounter: Payer: Self-pay | Admitting: Physical Therapy

## 2022-11-02 DIAGNOSIS — Z952 Presence of prosthetic heart valve: Secondary | ICD-10-CM | POA: Diagnosis not present

## 2022-11-02 DIAGNOSIS — R2681 Unsteadiness on feet: Secondary | ICD-10-CM | POA: Insufficient documentation

## 2022-11-02 DIAGNOSIS — M6281 Muscle weakness (generalized): Secondary | ICD-10-CM | POA: Diagnosis not present

## 2022-11-02 LAB — POCT INR: INR: 4.4 — AB (ref 2.0–3.0)

## 2022-11-02 NOTE — Patient Instructions (Signed)
Description   HOLD today's dose and then START taking 1 tablet daily except 1/2 tablet on Sunday, Mondays, Wednesdays, and Fridays.  Continue consistency with leafy green intake 3 servings/week.  Recheck INR in 2 weeks.  [facility] [phone].

## 2022-11-02 NOTE — Therapy (Signed)
OUTPATIENT PHYSICAL THERAPY TREATMENT / DISCHARGE SUMMARY  Progress Note  Reporting Period 09/25/2022 to 11/02/2022  See note below for Objective Data and Assessment of Progress/Goals.     Patient Name: [name] MRN: [id number] DOB:09/06/1945, 77 y.o., male Today's Date: 11/02/2022  END OF SESSION:  PT End of Session - 11/02/22 1155     Visit Number 8    Date for PT Re-Evaluation 11/06/22    Authorization Type [employer] MCR    Progress Note Due on Visit 10    PT Start Time 1155    PT Stop Time 1218   Pt requesting to leave early as he has another appt at 1:15   PT Time Calculation (min) 23 min    Activity Tolerance Patient tolerated treatment well    Behavior During Therapy [facility] for tasks assessed/performed;Impulsive                 Past Medical History:  Diagnosis Date   Actinic keratosis 02/14/2011   Allergy    Aneurysm of ascending aorta without rupture (HCC) 01/06/2022   Aortic stenosis    Arthritis    Bilateral sensorineural hearing loss 06/16/2009   CAD (coronary artery disease)    cabg   Complication of anesthesia    Coronary atherosclerosis of native coronary artery 06/15/2009   Diabetic polyneuropathy 11/11/2018   Dyslipidemia 11/05/2018   Embolic stroke involving left middle cerebral artery  05/30/2021   Erectile dysfunction 05/18/2009   Essential hypertension 06/16/2009   Expressive aphasia 06/25/2021   External hemorrhoids 03/21/2010   Gout 04/09/2015   Heart murmur    Hemochromatosis 02/14/2011   History of aortic valve replacement 10/13/2009   History of CABG 11/05/2018   2005- 1. Coronary artery bypass grafting x4 with LIMA-LAD, SVG-OM1, SVG-AM and dCFX.  2. Aortic valve replacement with [employer] AVR 3. Septal myomectomy.  4. Myoview low risk 2016   History of hepatitis 01/31/1972   Hypertriglyceridemia 06/13/2010   Hypertrophic obstructive cardiomyopathy 05/06/2008   SP septal myomectomy at surgery 2005 Echo Nov 2018- Hyperdynamic LVEF  with severe basal septal hypertrophy. There is chordal SAM with resting gradient of 16 mmHg that increases to 85 mmHg with Valsalva        Leg weakness, bilateral 01/18/2021   Long term (current) use of anticoagulants 05/22/2018   Lumbar stenosis with neurogenic claudication 02/04/2021   Malignant neoplasm of prostate 06/17/2009   Mechanical heart valve present    Manufacturer: [employer] Duty #: [id number]  Model #: [id number]. Card states MRI compatible with 3 teslas or less.   Obesity, unspecified 04/29/2008   OSA (obstructive sleep apnea) 07/12/2012   moderate-severe; uses CPAP nightly   PONV (postoperative nausea and vomiting)    after CABG- slow to wake up   Presence of prosthetic heart valve    Primary osteoarthritis of left shoulder 10/31/2018   S/P lumbar laminectomy 02/04/2021   Stage 3 chronic kidney disease due to type 2 diabetes mellitus 11/11/2018   Tobacco use 06/16/2009   Type 2 diabetes mellitus with hyperglycemia    Ventral hernia 03/03/2014   Past Surgical History:  Procedure Laterality Date   AORTIC VALVE REPLACEMENT  11/14/2003   [facility]   APPENDECTOMY  1990   CHOLECYSTECTOMY  1990   CORONARY ANGIOGRAPHY  10/11/2021   CORONARY ARTERY BYPASS GRAFT  10/2003   HERNIA REPAIR  1999   right, inguinal   HERNIA REPAIR  2002   left, inguinal   HIP SURGERY  2006   right hip   IR CT HEAD LTD  02/03/2022   IR PERCUTANEOUS ART THROMBECTOMY/INFUSION INTRACRANIAL INC DIAG ANGIO  01/24/2022   LUMBAR LAMINECTOMY/DECOMPRESSION MICRODISCECTOMY Bilateral 02/04/2021   Procedure: Bilateral Lumbar Two-Three Laminectomy;  Surgeon: [name], MD;  Location: [facility] OR;  Service: Neurosurgery;  Laterality: Bilateral;  3C/RM 20   PILONIDAL CYST EXCISION  1964   prostate seed implant  03/2010   RADIOLOGY WITH ANESTHESIA N/A 01/24/2022   Procedure: IR WITH ANESTHESIA;  Surgeon: Radiologist, Medication, MD;  Location: [facility] OR;  Service: Radiology;  Laterality: N/A;   TEE WITHOUT  CARDIOVERSION N/A 03/14/2022   Procedure: TRANSESOPHAGEAL ECHOCARDIOGRAM (TEE);  Surgeon: Parke Poisson, MD;  Location: [facility] ENDOSCOPY;  Service: Cardiology;  Laterality: N/A;   TONSILLECTOMY  childhood   Patient Active Problem List   Diagnosis Date Noted   Hypertrophic cardiomyopathy (HCC) 08/12/2022   Chronic anticoagulation 08/12/2022   Hypotension 04/27/2022   H/O: CVA (cerebrovascular accident) 03/14/2022   Urinary retention 01/26/2022   Acute ischemic stroke (HCC) 01/24/2022   Middle cerebral artery embolism, left 01/24/2022   Aneurysm of ascending aorta without rupture (HCC) 01/06/2022   Type 2 diabetes mellitus with stage 3a chronic kidney disease, with long-term current use of insulin (HCC) 10/30/2021   Diabetes mellitus (HCC) 10/30/2021   Type 2 diabetes mellitus with diabetic polyneuropathy, with long-term current use of insulin (HCC) 10/30/2021   NSTEMI (non-ST elevated myocardial infarction) (HCC) 10/09/2021   Paroxysmal atrial fibrillation (HCC) 10/09/2021   Presence of prosthetic heart valve    Type 2 diabetes mellitus with hyperglycemia    Expressive aphasia 06/25/2021   CVA (cerebral vascular accident) (HCC) 05/2021   S/P lumbar laminectomy 02/04/2021   Lumbar stenosis with neurogenic claudication 02/04/2021   History of falling 01/30/2021   Diabetic polyneuropathy 11/11/2018   Stage 3 chronic kidney disease due to type 2 diabetes mellitus 11/11/2018   History of CABG 11/05/2018   Dyslipidemia 11/05/2018   Primary osteoarthritis of left shoulder 10/31/2018   Long term (current) use of anticoagulants 05/22/2018   Ventral hernia 03/03/2014   OSA (obstructive sleep apnea) 07/12/2012   Hemochromatosis 02/14/2011   Actinic keratosis 02/14/2011   Hypertriglyceridemia 06/13/2010   External hemorrhoids 03/21/2010   History of aortic valve replacement 10/13/2009   Malignant neoplasm of prostate (HCC) 06/17/2009   Bilateral sensorineural hearing loss 06/16/2009    Essential hypertension 06/16/2009   Coronary atherosclerosis of native coronary artery 06/15/2009   Erectile dysfunction 05/18/2009   Obesity, unspecified 04/29/2008    PCP: Sandford Craze, NP  REFERRING PROVIDER: Sandford Craze, NP  REFERRING DIAG: [phone] (ICD-10-CM) - H/O: CVA (cerebrovascular accident)  THERAPY DIAG:  Muscle weakness (generalized)  Unsteadiness on feet  RATIONALE FOR EVALUATION AND TREATMENT: Rehabilitation  ONSET DATE: CVA in December 2023  NEXT MD VISIT: 11/13/22   SUBJECTIVE:   SUBJECTIVE STATEMENT: Pt reports he has organized his HEP exercises and found places in the gym and at home to perform the exercises.  EVAL: Had a stroke in December and it affected my right side. I do have some residual things going on- tremor when I'm trying to use my right hand, working on residual effects from bladder, and I'd like PT to work on my right foot "It feels like its waking up". Having a hard time feeling brake and gas pedals with my right foot. My balance is good. Have some cracking from my knee sometimes from OA. Feel like strength could be an issue, when the foot is  waking up there is some weakness. Have been trying to wake it up in the gym. They think that my INR levels are causing the strokes, working on getting it in an exact range to prevent more CVAs. I will use my walker if I'm going far especially in the heat.   PERTINENT HISTORY: Aneurysm of ascending aorta, aortic stenosis, B sensorineural hearing loss - extremely HOH, CAD s/p CABG, cardiomyopathy, AVR, chronic anticoagulation, DM-II with neuropathy, CVA of MCA, HTN, expressive aphasia, gout, lumbar stenosis s/p lumbar laminectomy and decompression 2023, shoulder OA, CKD, ventral hernia, hernia repair, hip surgery, hx multiple strokes (3-4), prostate cancer  PAIN:  Are you having pain? No 0/10, "don't really have pain"   PRECAUTIONS: None  WEIGHT BEARING RESTRICTIONS: No  FALLS:  Has patient  fallen in last 6 months? No  LIVING ENVIRONMENT: Lives with: lives with their family Lives in: House/apartment Stairs: No Has following equipment at home: Environmental consultant - 2 wheeled, Crutches, and Grab bars  OCCUPATION: retired   PLOF: Independent, Independent with basic ADLs, Independent with gait, and Independent with transfers  PATIENT GOALS: "get my right leg to wake up "     OBJECTIVE:   DIAGNOSTIC FINDINGS:  04/09/22 - MRI Brain IMPRESSION: 1. Positive for a tiny acute cortical infarct of the left frontal operculum, within an area of chronic Left MCA middle division infarct with encephalomalacia and laminar necrosis. No acute hemorrhage or mass effect. 2. Conspicuous increased intrinsic T1 signal in the left lentiform nuclei since December. Nonspecific, but favor this is a form of Wallerian degeneration secondary to the chronic ischemia in #1. 3. Elsewhere stable noncontrast MRI appearance of the brain since last year., including chronic bilateral cerebellar infarcts and occasional punctate chronic microhemorrhages.  04/09/22 - CT Head: IMPRESSION: 1. No acute intracranial abnormality. 2. Chronic left MCA and cerebellar infarcts.  PATIENT SURVEYS:  ABC scale 90.9% ; (09/25/22 - 90.0 %)  COGNITION: Overall cognitive status: Within functional limits for tasks assessed     SENSATION: "Feels like R LE is still waking up"   LOWER EXTREMITY MMT:  MMT Right eval Left eval R 09/13/22 L 09/13/22 R 09/25/22 L 09/25/22 R 10/26/22 L 10/26/22  Hip flexion 4- 4+ 4- 4+ 4 5 4 5   Hip extension   3+ 4 4 4+ 4 4+  Hip abduction 4+ 5 4- 4 4 4+ 4 4  Hip adduction   4- 4 4 4+ 4 4+  Hip internal rotation   3+ 5 4 5  4+ 5  Hip external rotation   3+ 4 4- 4+ 4 4+  Knee flexion 4 4   5 5 5 5   Knee extension 4+ 4+   5 5 5 5   Ankle dorsiflexion 5 5   4+ 4 4+ 5  Ankle plantarflexion     4+  15 SLS HR 5 5 5   Ankle inversion     4 4+ 4 5  Ankle eversion     4 4+ 4 4+   (Blank rows = not  tested)  FUNCTIONAL TESTS:  3 minute walk test: 505 ft no device, mild unsteadiness on turns, moderate SOB by end of gait period  Able to hold SLS R LE 30 seconds, tandem stance with R foot in the back x30 seconds   09/25/22:  : 581 ft w/o AD, mild SOB Berg: 46/56; 46-51 moderate risk for falls (>50%)  GAIT: Distance walked: in clinic distances  Assistive device utilized: None Level of assistance: Complete Independence  Comments: varus knees at baseline, mild unsteadiness on turns    TODAY'S TREATMENT:                                                                                                                              DATE:   11/02/22 THERAPEUTIC ACTIVITIES: SLS: R = 20.68 sec, L = 6.72 sec Berg: 55/56 Goal assessment   THERAPEUTIC EXERCISE: to improve flexibility, strength and mobility.  Demonstration, verbal and tactile cues throughout for technique. B negative heel raises over edge of 4" step x 10 R eccentric negative heel raises over edge of 4" step x 10 Recommendations for ongoing performance of HEP and gym based exercises - pt denies need for further review   10/26/22 THERAPEUTIC EXERCISE: to improve flexibility, strength and mobility.  Demonstration, verbal and tactile cues throughout for technique.  Standing R/L hip ABD with looped RTB at ankles x 10, UE support on back of chair for balance Standing R/L hip extension with looped RTB at ankles x 10, UE support on back of chair for balance Standing R/L hip flexion march with looped RTB at midfeet x 10, UE support on back of chair for balance Standing R/l hip flexion & ER x 10 Seated R ankle eversion & DF with looped RTB x 10 Verbal review of remainder of HEP with demonstration as needed  THERAPEUTIC ACTIVITIES: LE MMT Initiation of LTG assessment   10/12/22 THERAPEUTIC EXERCISE: to improve flexibility, strength and mobility.  Demonstration, verbal and tactile cues throughout for technique. TM - 2.0 mph x 4  min  NEUROMUSCULAR RE-EDUCATION: To improve balance, proprioception, coordination, and reduce fall risk.  Alt fwd step + contralateral reach to cone atop 36"FR x 20 Corner balance progression with 3/4 tandem stance, staggered stance, and tandem stance (1 set with each foot fwd) on firm surface with arms crossed on chest (SBA/CGA of PT for safety)            Eyes open: - static stance 2 x 30 sec (tandem stance) - horiz head turns x 5 (tandem stance) - vertical head nods x 5 (3/4 tandem stance, staggered stance) - trunk rotation x 5 Eyes closed: (3/4 tandem stance) - static stance x 30 sec - horiz head turns x 5 - vertical head nods x 5 - trunk rotation x 5 Corner balance progression with right SLS and left SLS on firm surface with arms wide, arms at sides, and intermittent 1-2 finger support on back of chair  (SBA/CGA of PT for safety)            Eyes open: - static stance 3 x 30 sec Alt toe clears from Airex pad to 9" stool (standing in corner for safety) x 20 - more difficulty on R   PATIENT EDUCATION:  Education details: HEP review, recommended frequency for ongoing HEP at discharge to prevent loss of gains achieved with PT, and review of scheduling of HEP exercises to perform some  exercise home on days he does not go to gym    Person educated: Patient Education method: Explanation Education comprehension: verbalized understanding  HOME EXERCISE PROGRAM: Access Code: [email or web address] URL: [email or web address] Date: 10/26/2022 Prepared by: Glenetta Hew  Exercises - Seated Ankle Dorsiflexion Stretch  - 2 x daily - 7 x weekly - 1 sets - 10 reps - 5 seconds  hold - Supine Bridge  - 2 x daily - 7 x weekly - 1 sets - 10 reps - 2 seconds  hold - Eccentric Heel Lowering on Step (Mirrored)  - 1 x daily - 7 x weekly - 2 sets - 10 reps - 3-5 sec hold - Eccentric Single Heel Raises with Leg Press  2 Up, 1 Down  (Mirrored)  - 1 x daily - 7 x weekly - 2 sets - 10 reps - 3 sec hold -  Isometric Ankle Inversion at Wall  - 1 x daily - 7 x weekly - 2 sets - 10 reps - 3-5 sec hold - Isometric Ankle Eversion at Wall  - 1 x daily - 7 x weekly - 2 sets - 10 reps - 3-5 sec hold - Seated Toe Curl  - 1 x daily - 7 x weekly - 2 sets - 10 reps - 3 sec hold - Towel Scrunches  - 2-3 x daily - 7 x weekly - 2 sets - 15 reps - Seated Lesser Toes Extension  - 1 x daily - 7 x weekly - 2 sets - 10 reps - 3 sec hold - Toe Spreading  - 1 x daily - 7 x weekly - 2 sets - 10 reps - 3 sec hold - Seated Ankle Dorsiflexion with Resistance  - 1 x daily - 7 x weekly - 2 sets - 10 reps - 3 sec hold - Ankle Inversion with Anchored Resistance at Table  - 1 x daily - 7 x weekly - 2 sets - 10 reps - 3 sec hold - Seated Ankle Eversion with Resistance  - 1 x daily - 7 x weekly - 2 sets - 10 reps - 3 sec hold - Clam with Resistance  - 1 x daily - 3 x weekly - 2 sets - 10 reps - 3-5 sec hold - Standing Hip Abduction with Resistance at Ankles and Counter Support  - 1 x daily - 3 x weekly - 2 sets - 10 reps - 3 sec hold - Standing Hip Extension with Resistance at Ankles and Counter Support  - 1 x daily - 3 x weekly - 2 sets - 10 reps - 3 sec hold - Marching with Resistance  - 1 x daily - 3 x weekly - 2 sets - 10 reps - 3 sec hold - Sit to Stand with Resistance Around Legs  - 1 x daily - 3 x weekly - 2 sets - 10 reps - Standing Single Leg Hip ER   - 1 x daily - 3 x weekly - 2 sets - 10 reps - 3 sec hold   ASSESSMENT:  CLINICAL IMPRESSION: David "Len" reports he has organized his HEP and identified locations to work on all the exercise, thus denies need for further review today. His Berg score has improved from 46/56 to 55/56, although his perceived balance confidence per the ABC scale has actually declined slightly from 90% to 88.8% (not statistically significant). He denies any concerns with walking, therefore deferred 3 minute walk test as pt requesting to leave early for another appt. MMT completed  last visit  revealing continued mild weakness in R hip and ankle, with relevant exercises to address this ongoing weakness reviewed as part of the HEP update but verbal clarification of relevant gym machines also provided today. Majority of PT goals now met or partially met and Len feels ready to transition to his HEP, therefore will proceed with discharge from physical therapy for this episode.  OBJECTIVE IMPAIRMENTS: Abnormal gait, decreased activity tolerance, decreased balance, difficulty walking, decreased strength, and obesity.   ACTIVITY LIMITATIONS: locomotion level  PARTICIPATION LIMITATIONS: driving, shopping, and community activity  PERSONAL FACTORS: Age, Education, Fitness, Past/current experiences, Social background, and Time since onset of injury/illness/exacerbation are also affecting patient's functional outcome.   REHAB POTENTIAL: Good  CLINICAL DECISION MAKING: Evolving/moderate complexity  EVALUATION COMPLEXITY: Moderate   GOALS: Goals reviewed with patient? Yes  SHORT TERM GOALS: Target date: 09/27/2022    Will be compliant with appropriate progressive HEP  Baseline: Goal status: REVISED (refer to LTG #1 below)  09/25/22 - patient selectively compliant with HEP  2.  MMT to be 5/5 globally  Baseline:  Goal status: REVISED (refer to LTG #2 below)  09/25/22 - refer to above MMT table  3.  Will report 50% improvement in proprioception and 50% improvement in feelings of R LE "being awake"  Baseline:  Goal status: MET  09/25/22 - pt reports 50-60% improvement  4.  ABC score to improve by 5 points to show subjective progress  Baseline: 90.9% Goal status: REVISED (refer to LTG #3 below)  09/25/22 - 1440 / 1600 = 90.0 %  5.  Will ambulate 600 ft in with minimal SOB  Baseline: 505 ft with moderate SOB, 09/25/22 - 581 ft with mild SOB Goal status: NOT ASSESSED 11/02/22 as pt requesting to leave early  LONG TERM GOALS: Target date: 11/06/2022  1.  Patient will be independent with  ongoing/advanced HEP for self-management at home Baseline:  Goal status: MET - 11/02/22   2.  Patient will demonstrate improved B LE strength to >/= 4+/5 for improved stability and ease of mobility  Baseline: Refer to above MMT table Goal status: PARTIALLY MET  10/26/22 - majority of B LE muscle groups 4+ to 5/5 but continued weakness at R>L hips and R lateral ankle (4/5)  3.  Patient will improve ABC scale score by >/5 points to demonstrate improved balance confidence. Baseline: 90.0% Goal status: NOT MET - 11/02/22 - 1420 / 1600 = 88.8 %  4.  Patient will improve Berg score to >/= 52/56 to improve safety and stability with ADLs in standing and reduce risk for falls Baseline: 46/56 Goal status: MET - 11/02/22 - 55/56  5.  Patient will report improved stability when "passing the peace" in church w/o need for extended hold on other person's hand due to improved balance. Baseline:  Goal status: MET - 11/02/22    PLAN:  PT FREQUENCY: 1x/week  PT DURATION: 4-6 weeks (4 visits)  PLANNED INTERVENTIONS: Therapeutic exercises, Therapeutic activity, Neuromuscular re-education, Balance training, Gait training, Patient/Family education, Self Care, Stair training, Manual therapy, and Re-evaluation  PLAN FOR NEXT SESSION: transition to HEP & gym program, D/C from PT    PHYSICAL THERAPY DISCHARGE SUMMARY  Visits from Start of Care: 8  Current functional level related to goals / functional outcomes: Refer to above clinical impression and goal assessment.   Remaining deficits: Mild R hip and ankle weakness   Education / Equipment: HEP & gym program   Patient agrees to discharge. Patient  goals were mostly met or partially met. Patient is being discharged due to meeting the stated rehab goals.   Marry Guan, PT  11/02/22 1:01 PM

## 2022-11-06 ENCOUNTER — Ambulatory Visit: Payer: Medicare HMO | Admitting: Physician Assistant

## 2022-11-06 ENCOUNTER — Ambulatory Visit (HOSPITAL_BASED_OUTPATIENT_CLINIC_OR_DEPARTMENT_OTHER)
Admission: RE | Admit: 2022-11-06 | Discharge: 2022-11-06 | Disposition: A | Discharge status: Home / Self Care | Payer: Medicare HMO | Source: Ambulatory Visit | Attending: Cardiology | Admitting: Cardiology

## 2022-11-06 DIAGNOSIS — I7121 Aneurysm of the ascending aorta, without rupture: Secondary | ICD-10-CM | POA: Insufficient documentation

## 2022-11-06 DIAGNOSIS — N281 Cyst of kidney, acquired: Secondary | ICD-10-CM | POA: Diagnosis not present

## 2022-11-06 DIAGNOSIS — I700 Atherosclerosis of aorta: Secondary | ICD-10-CM | POA: Diagnosis not present

## 2022-11-06 DIAGNOSIS — I517 Cardiomegaly: Secondary | ICD-10-CM | POA: Diagnosis not present

## 2022-11-06 LAB — POCT I-STAT CREATININE: Creatinine, Ser: 1.6 mg/dL — ABNORMAL HIGH (ref 0.61–1.24)

## 2022-11-06 MED ORDER — IOHEXOL 350 MG/ML SOLN
75.0000 mL | Freq: Once | INTRAVENOUS | Status: AC | PRN
  Administered 2022-11-06: 75 mL via INTRAVENOUS

## 2022-11-07 ENCOUNTER — Telehealth: Payer: Self-pay

## 2022-11-07 ENCOUNTER — Ambulatory Visit: Payer: Medicare HMO | Admitting: Physician Assistant

## 2022-11-07 DIAGNOSIS — M19012 Primary osteoarthritis, left shoulder: Secondary | ICD-10-CM | POA: Diagnosis not present

## 2022-11-07 NOTE — Progress Notes (Signed)
Office Visit Note   Patient: [name]           Date of Birth: [birth year]           MRN: [id number] Visit Date: 11/07/2022              Requested by: Sandford Craze, NP [address] [place],  [place] [address] PCP: Sandford Craze, NP   Assessment & Plan: Visit Diagnoses:  1. Primary osteoarthritis, left shoulder     Plan: [name] is a pleasant 77 year old right-hand-dominant gentleman that is accompanied by his wife.  He has a complicated medical history including myocardial infarction in the past chronic anticoagulation with Coumadin secondary to an artificial valve and a history of for recent strokes.  He also has a history of diabetes. his wife is very knowledgeable and says that he has become more prominent because of the age of his heart valve.  His Coumadin has been adjusted by his cardiologist he has severe pain of his left shoulder and very limited movement.  Although his right arm is dominant it got just slightly weaker because of the stroke so he does rely on his left arm a bit more.  He did have an incident a couple years ago of lifting something heavy and felt a pop in his shoulder.  Since then the pain is progressively gotten worse.  I suspect he tore his rotator cuff.  Radiographs show severe changes in the glenohumeral joint with loss of joint space and cystic changes in osteophytes.  He has tried physical therapy.  Because of his Coumadin he has been told not to take lots of steroid shots.  He and his wife are interested in discussing joint replacement.  He may very well need a reverse total shoulder.  They have discussed this with his cardiologist and although he has some concerns given his history he thinks if he can be anticoagulated appropriately with bridging that he would be able to have surgery.  We will follow-up with diagnostic studies per Dr. Diamantina Providence protocol.  His medical assistant is going to take care of this for me.  Will have a follow-up  with Dr. August Saucer once this is completed.  Follow-Up Instructions: After studies with Dr. August Saucer  Orders:  No orders of the defined types were placed in this encounter.  No orders of the defined types were placed in this encounter.     Procedures: No procedures performed   Clinical Data: No additional findings.   Subjective: Chief Complaint  Patient presents with   Left Shoulder - Pain    HPI pleasant 77 year old gentleman with increasing left shoulder pain and stiffness.  Denies any recent injuries but says that a couple years ago he was lifting something heavy and felt a pop in his left shoulder.  Since then had progressive loss of motion and pain in the left shoulder.  His medical history is a bit complicated with a history of strokes.  He has been on chronic anticoagulation therapy which has been adjusted.  Normally he is ambulatory with activities of daily living at this point he cannot even button his shirt  Review of Systems  All other systems reviewed and are negative.    Objective: Vital Signs: There were no vitals taken for this visit.  Physical Exam Constitutional:      Appearance: Normal appearance.  Skin:    General: Skin is warm and dry.  Neurological:     General: No focal  deficit present.     Mental Status: He is alert.  Psychiatric:        Mood and Affect: Mood normal.        Behavior: Behavior normal.     Ortho Exam Examination of his left arm he does have a strong radial pulse.  No swelling no ecchymosis good brisk capillary refill.  He has limited motion actively of his shoulder.  He can forward elevate to about 90 degrees cannot internally rotate behind his back.  Does have almost no motion with external rotation and is quite painful. Specialty Comments:  No specialty comments available.  Imaging: No results found.   PMFS History: Patient Active Problem List   Diagnosis Date Noted   Hypertrophic cardiomyopathy (HCC) 08/12/2022   Chronic  anticoagulation 08/12/2022   Hypotension 04/27/2022   H/O: CVA (cerebrovascular accident) 03/14/2022   Urinary retention 01/26/2022   Acute ischemic stroke (HCC) 01/24/2022   Middle cerebral artery embolism, left 01/24/2022   Aneurysm of ascending aorta without rupture (HCC) 01/06/2022   Type 2 diabetes mellitus with stage 3a chronic kidney disease, with long-term current use of insulin (HCC) 10/30/2021   Diabetes mellitus (HCC) 10/30/2021   Type 2 diabetes mellitus with diabetic polyneuropathy, with long-term current use of insulin (HCC) 10/30/2021   NSTEMI (non-ST elevated myocardial infarction) (HCC) 10/09/2021   Paroxysmal atrial fibrillation (HCC) 10/09/2021   Presence of prosthetic heart valve    Type 2 diabetes mellitus with hyperglycemia    Expressive aphasia 06/25/2021   CVA (cerebral vascular accident) (HCC) 05/2021   S/P lumbar laminectomy 02/04/2021   Lumbar stenosis with neurogenic claudication 02/04/2021   History of falling 01/30/2021   Diabetic polyneuropathy 11/11/2018   Stage 3 chronic kidney disease due to type 2 diabetes mellitus 11/11/2018   History of CABG 11/05/2018   Dyslipidemia 11/05/2018   Primary osteoarthritis, left shoulder 10/31/2018   Long term (current) use of anticoagulants 05/22/2018   Ventral hernia 03/03/2014   OSA (obstructive sleep apnea) 07/12/2012   Hemochromatosis 02/14/2011   Actinic keratosis 02/14/2011   Hypertriglyceridemia 06/13/2010   External hemorrhoids 03/21/2010   History of aortic valve replacement 10/13/2009   Malignant neoplasm of prostate (HCC) 06/17/2009   Bilateral sensorineural hearing loss 06/16/2009   Essential hypertension 06/16/2009   Coronary atherosclerosis of native coronary artery 06/15/2009   Erectile dysfunction 05/18/2009   Obesity, unspecified 04/29/2008   Past Medical History:  Diagnosis Date   Actinic keratosis 02/14/2011   Allergy    Aneurysm of ascending aorta without rupture (HCC) 01/06/2022    Aortic stenosis    Arthritis    Bilateral sensorineural hearing loss 06/16/2009   CAD (coronary artery disease)    cabg   Complication of anesthesia    Coronary atherosclerosis of native coronary artery 06/15/2009   Diabetic polyneuropathy 11/11/2018   Dyslipidemia 11/05/2018   Embolic stroke involving left middle cerebral artery  05/30/2021   Erectile dysfunction 05/18/2009   Essential hypertension 06/16/2009   Expressive aphasia 06/25/2021   External hemorrhoids 03/21/2010   Gout 04/09/2015   Heart murmur    Hemochromatosis 02/14/2011   History of aortic valve replacement 10/13/2009   History of CABG 11/05/2018   2005- 1. Coronary artery bypass grafting x4 with LIMA-LAD, SVG-OM1, SVG-AM and dCFX.  2. Aortic valve replacement with [employer] Jude AVR 3. Septal myomectomy.  4. Myoview low risk 2016   History of hepatitis 01/31/1972   Hypertriglyceridemia 06/13/2010   Hypertrophic obstructive cardiomyopathy 05/06/2008   SP septal myomectomy at surgery  2005 Echo Nov 2018- Hyperdynamic LVEF with severe basal septal hypertrophy. There is chordal SAM with resting gradient of 16 mmHg that increases to 85 mmHg with Valsalva        Leg weakness, bilateral 01/18/2021   Long term (current) use of anticoagulants 05/22/2018   Lumbar stenosis with neurogenic claudication 02/04/2021   Malignant neoplasm of prostate 06/17/2009   Mechanical heart valve present    Manufacturer: [employer] #: [id number]  Model #: [id number]. Card states MRI compatible with 3 teslas or less.   Obesity, unspecified 04/29/2008   OSA (obstructive sleep apnea) 07/12/2012   moderate-severe; uses CPAP nightly   PONV (postoperative nausea and vomiting)    after CABG- slow to wake up   Presence of prosthetic heart valve    Primary osteoarthritis of left shoulder 10/31/2018   S/P lumbar laminectomy 02/04/2021   Stage 3 chronic kidney disease due to type 2 diabetes mellitus 11/11/2018   Tobacco use 06/16/2009   Type 2  diabetes mellitus with hyperglycemia    Ventral hernia 03/03/2014    Family History  Problem Relation Age of Onset   Heart disease Mother    Stroke Mother    Heart disease Father    Asperger's syndrome Son    Hyperlipidemia Son    Coronary artery disease Brother    Cancer Neg Hx        negative for colon cancer    Past Surgical History:  Procedure Laterality Date   AORTIC VALVE REPLACEMENT  11/14/2003   [employer] [facility]   APPENDECTOMY  1990   CHOLECYSTECTOMY  1990   CORONARY ANGIOGRAPHY  10/11/2021   CORONARY ARTERY BYPASS GRAFT  10/2003   HERNIA REPAIR  1999   right, inguinal   HERNIA REPAIR  2002   left, inguinal   HIP SURGERY  2006   right hip   IR CT HEAD LTD  02/03/2022   IR PERCUTANEOUS ART THROMBECTOMY/INFUSION INTRACRANIAL INC DIAG ANGIO  01/24/2022   LUMBAR LAMINECTOMY/DECOMPRESSION MICRODISCECTOMY Bilateral 02/04/2021   Procedure: Bilateral Lumbar Two-Three Laminectomy;  Surgeon: [name], MD;  Location: [facility] OR;  Service: Neurosurgery;  Laterality: Bilateral;  3C/RM 20   PILONIDAL CYST EXCISION  1964   prostate seed implant  03/2010   RADIOLOGY WITH ANESTHESIA N/A 01/24/2022   Procedure: IR WITH ANESTHESIA;  Surgeon: Radiologist, Medication, MD;  Location: [facility] OR;  Service: Radiology;  Laterality: N/A;   TEE WITHOUT CARDIOVERSION N/A 03/14/2022   Procedure: TRANSESOPHAGEAL ECHOCARDIOGRAM (TEE);  Surgeon: Parke Poisson, MD;  Location: [facility] ENDOSCOPY;  Service: Cardiology;  Laterality: N/A;   TONSILLECTOMY  childhood   Social History   Occupational History   Occupation: Retired  Tobacco Use   Smoking status: Some Days    Types: Cigars    Last attempt to quit: 06/2021    Years since quitting: 1.3   Smokeless tobacco: Never   Tobacco comments:    Pt states he smokes 1 cigar about once a month. 06/01/2021    Pt quit  Cigars 06/2021  Substance and Sexual Activity   Alcohol use: Not Currently    Comment: wine maybe 1 per month   Drug use: No   Sexual  activity: Never

## 2022-11-07 NOTE — Telephone Encounter (Signed)
Need to place order

## 2022-11-07 NOTE — Telephone Encounter (Signed)
-----   Message from Rolanda Lundborg sent at 11/07/2022  1:41 PM EDT ----- This is the patient that [place] spoke with you about.  She states that he needs the CT scan that Dr. August Saucer uses for shoulder replacement and appointment with Dr. August Saucer afterwards.

## 2022-11-08 ENCOUNTER — Other Ambulatory Visit: Payer: Self-pay

## 2022-11-08 DIAGNOSIS — M19012 Primary osteoarthritis, left shoulder: Secondary | ICD-10-CM

## 2022-11-13 ENCOUNTER — Ambulatory Visit: Payer: Medicare HMO | Admitting: Family

## 2022-11-13 DIAGNOSIS — E785 Hyperlipidemia, unspecified: Secondary | ICD-10-CM | POA: Diagnosis not present

## 2022-11-13 DIAGNOSIS — I10 Essential (primary) hypertension: Secondary | ICD-10-CM | POA: Diagnosis not present

## 2022-11-14 LAB — COMPREHENSIVE METABOLIC PANEL
ALT: 20 [IU]/L (ref 0–44)
AST: 26 [IU]/L (ref 0–40)
Albumin: 4.2 g/dL (ref 3.8–4.8)
Alkaline Phosphatase: 59 [IU]/L (ref 44–121)
BUN/Creatinine Ratio: 19 (ref 10–24)
BUN: 32 mg/dL — ABNORMAL HIGH (ref 8–27)
Bilirubin Total: 0.3 mg/dL (ref 0.0–1.2)
CO2: 21 mmol/L (ref 20–29)
Calcium: 9.2 mg/dL (ref 8.6–10.2)
Chloride: 103 mmol/L (ref 96–106)
Creatinine, Ser: 1.66 mg/dL — ABNORMAL HIGH (ref 0.76–1.27)
Globulin, Total: 3.1 g/dL (ref 1.5–4.5)
Glucose: 161 mg/dL — ABNORMAL HIGH (ref 70–99)
Potassium: 4.7 mmol/L (ref 3.5–5.2)
Sodium: 142 mmol/L (ref 134–144)
Total Protein: 7.3 g/dL (ref 6.0–8.5)
eGFR: 42 mL/min/{1.73_m2} — ABNORMAL LOW (ref >59)

## 2022-11-14 LAB — LIPID PANEL
Chol/HDL Ratio: 5.2 {ratio} — ABNORMAL HIGH (ref 0.0–5.0)
Cholesterol, Total: 140 mg/dL (ref 100–199)
HDL: 27 mg/dL — ABNORMAL LOW (ref >39)
LDL Chol Calc (NIH): 70 mg/dL (ref 0–99)
Triglycerides: 263 mg/dL — ABNORMAL HIGH (ref 0–149)
VLDL Cholesterol Cal: 43 mg/dL — ABNORMAL HIGH (ref 5–40)

## 2022-11-15 ENCOUNTER — Ambulatory Visit: Payer: Medicare HMO | Attending: Cardiology | Admitting: *Deleted

## 2022-11-15 DIAGNOSIS — Z7901 Long term (current) use of anticoagulants: Secondary | ICD-10-CM | POA: Diagnosis not present

## 2022-11-15 DIAGNOSIS — Z952 Presence of prosthetic heart valve: Secondary | ICD-10-CM | POA: Diagnosis not present

## 2022-11-15 LAB — POCT INR: INR: 2.6 (ref 2.0–3.0)

## 2022-11-15 NOTE — Patient Instructions (Addendum)
Description   Today take 1 tablet of warfarin then START taking 1 tablet daily except 1/2 tablet on Mondays, Wednesdays, and Fridays.  Continue consistency with leafy green intake 3 servings/week.  Recheck INR in 2 weeks.  [facility] [phone].

## 2022-11-16 ENCOUNTER — Telehealth: Payer: Self-pay | Admitting: Family

## 2022-11-16 NOTE — Telephone Encounter (Signed)
[name] with [facility] called to check to see if we got a prior auth on the CT that is scheduled for CT scan. Please call her back at [phone] to advise.

## 2022-11-17 ENCOUNTER — Ambulatory Visit (INDEPENDENT_AMBULATORY_CARE_PROVIDER_SITE_OTHER): Payer: Medicare HMO | Admitting: Family

## 2022-11-17 VITALS — BP 104/53 | HR 69 | Temp 97.4°F | Resp 16 | Ht 69.0 in | Wt 214.0 lb

## 2022-11-17 DIAGNOSIS — E785 Hyperlipidemia, unspecified: Secondary | ICD-10-CM | POA: Diagnosis not present

## 2022-11-17 DIAGNOSIS — E1122 Type 2 diabetes mellitus with diabetic chronic kidney disease: Secondary | ICD-10-CM | POA: Diagnosis not present

## 2022-11-17 DIAGNOSIS — Z23 Encounter for immunization: Secondary | ICD-10-CM

## 2022-11-17 DIAGNOSIS — E119 Type 2 diabetes mellitus without complications: Secondary | ICD-10-CM | POA: Diagnosis not present

## 2022-11-17 DIAGNOSIS — Z794 Long term (current) use of insulin: Secondary | ICD-10-CM

## 2022-11-17 DIAGNOSIS — M25512 Pain in left shoulder: Secondary | ICD-10-CM | POA: Insufficient documentation

## 2022-11-17 DIAGNOSIS — N1831 Chronic kidney disease, stage 3a: Secondary | ICD-10-CM

## 2022-11-17 DIAGNOSIS — Z952 Presence of prosthetic heart valve: Secondary | ICD-10-CM

## 2022-11-17 LAB — HEMOGLOBIN A1C: Hgb A1c MFr Bld: 7.8 % — ABNORMAL HIGH (ref 4.6–6.5)

## 2022-11-17 NOTE — Assessment & Plan Note (Signed)
  LDL 70 and total cholesterol 140 on Crestor 40mg . Triglycerides slightly elevated. -Continue Crestor 40mg .

## 2022-11-17 NOTE — Patient Instructions (Signed)
VISIT SUMMARY:  During your visit, we discussed your severe shoulder pain, urinary issues, diabetes, and neuropathy. We also reviewed your medications and general health maintenance. We have scheduled a CT scan for your shoulder and will continue to monitor your other health issues.  YOUR PLAN:  -SHOULDER PAIN: Your shoulder pain is due to a lack of cartilage, which is causing severe pain and limiting your mobility. We have scheduled a CT scan to assess the extent of the damage. We also discussed the possibility of surgery, but we will make a decision after reviewing the CT scan results. In the meantime, continue with your current pain management plan.  -URINARY INCONTINENCE: You reported leakage and increased frequency of urination. You are currently not using a catheter and are wearing Depends for leakage. You stopped taking Flomax, but did not notice any improvement. Please check back with your urologist if they want you to get back on flomax.   -DIABETES MELLITUS: You are managing your diabetes with Basaglar and NovoLog, and your morning glucose levels are generally good. We will check your A1C levels today and continue with your current insulin regimen and Farxiga.  -HYPERLIPIDEMIA: Your LDL and total cholesterol levels are good on Crestor 40mg , but your triglycerides are slightly elevated. Continue taking Crestor 40mg .  -ANTICOAGULATION MANAGEMENT: Your INR has been fluctuating due to inconsistent intake of greens and Coumadin. We recommend resuming your previous Coumadin dose and maintaining a consistent intake of greens. Continue regular INR checks at the Coumadin clinic.  INSTRUCTIONS:  For your general health maintenance, we administered the influenza vaccine today. We also recommend getting the COVID-19 booster and RSV vaccine (Arexvy) at your pharmacy.

## 2022-11-17 NOTE — Assessment & Plan Note (Signed)
  Severe pain and limited mobility due to lack of cartilage. CT scan scheduled for 11/28/2022 to assess the extent of damage. Discussed potential surgical intervention and associated stroke risk. -Continue current pain management plan until CT results are available.

## 2022-11-17 NOTE — Progress Notes (Signed)
Subjective:     Patient ID: [name], male    DOB: 1945/11/03, 77 y.o.   MRN: [id number]  Chief Complaint  Patient presents with   Hypertension    Here for follow up    HPI  Discussed the use of AI scribe software for clinical note transcription with the patient, who gave verbal consent to proceed.  History of Present Illness   The patient presents today for follow up.  He is working with orthopedics due to severe shoulder pain.  He reports that the pain is so severe that it's affecting his ability to dress himself and perform other daily activities. He expresses a desire for surgery, but the decision will be made after consulting with his orthopedist who plans to discuss with his cardiologist.  In addition to the shoulder pain, the patient is dealing with urinary issues. He reports that he is not using a catheter and is only experiencing some leakage, for which he is using Depends. He has stopped taking Flomax, a medication for urinary issues, but has not noticed any improvement.  The patient's diabetes is being managed with Basaglar and NovoLog. He reports that his blood sugar levels are generally good, with morning readings around 140. He is also taking [place], which he notes may be causing increased urination.  The patient also mentions that he has neuropathy and is experiencing some foot dragging, which is affecting his ability to drive. He is managing this issue with the use of a cane.     Lab Results  Component Value Date   HGBA1C 7.8 (H) 11/17/2022   HGBA1C 7.1 (A) 06/29/2022   HGBA1C 7.3 (H) 04/10/2022   Lab Results  Component Value Date   MICROALBUR <0.7 10/04/2021   LDLCALC 70 11/13/2022   CREATININE 1.66 (H) 11/13/2022        Health Maintenance Due  Topic Date Due   Zoster Vaccines- Shingrix (1 of 2) 03/22/1964   DTaP/Tdap/Td (3 - Td or Tdap) 03/01/2022   COVID-19 Vaccine (5 - 2023-24 season) 10/01/2022   Diabetic kidney evaluation - Urine ACR   10/05/2022    Past Medical History:  Diagnosis Date   Actinic keratosis 02/14/2011   Allergy    Aneurysm of ascending aorta without rupture (HCC) 01/06/2022   Aortic stenosis    Arthritis    Bilateral sensorineural hearing loss 06/16/2009   CAD (coronary artery disease)    cabg   Complication of anesthesia    Coronary atherosclerosis of native coronary artery 06/15/2009   Diabetic polyneuropathy 11/11/2018   Dyslipidemia 11/05/2018   Embolic stroke involving left middle cerebral artery  05/30/2021   Erectile dysfunction 05/18/2009   Essential hypertension 06/16/2009   Expressive aphasia 06/25/2021   External hemorrhoids 03/21/2010   Gout 04/09/2015   Heart murmur    Hemochromatosis 02/14/2011   History of aortic valve replacement 10/13/2009   History of CABG 11/05/2018   2005- 1. Coronary artery bypass grafting x4 with LIMA-LAD, SVG-OM1, SVG-AM and dCFX.  2. Aortic valve replacement with St Jude AVR 3. Septal myomectomy.  4. Myoview low risk 2016   History of hepatitis 01/31/1972   Hypertriglyceridemia 06/13/2010   Hypertrophic obstructive cardiomyopathy 05/06/2008   SP septal myomectomy at surgery 2005 Echo Nov 2018- Hyperdynamic LVEF with severe basal septal hypertrophy. There is chordal SAM with resting gradient of 16 mmHg that increases to 85 mmHg with Valsalva        Leg weakness, bilateral 01/18/2021   Long term (current) use  of anticoagulants 05/22/2018   Lumbar stenosis with neurogenic claudication 02/04/2021   Malignant neoplasm of prostate 06/17/2009   Mechanical heart valve present    Manufacturer: Theresa Duty #: [id number]  Model #: [id number]. Card states MRI compatible with 3 teslas or less.   Obesity, unspecified 04/29/2008   OSA (obstructive sleep apnea) 07/12/2012   moderate-severe; uses CPAP nightly   PONV (postoperative nausea and vomiting)    after CABG- slow to wake up   Presence of prosthetic heart valve    Primary osteoarthritis of left shoulder  10/31/2018   S/P lumbar laminectomy 02/04/2021   Stage 3 chronic kidney disease due to type 2 diabetes mellitus 11/11/2018   Tobacco use 06/16/2009   Type 2 diabetes mellitus with hyperglycemia    Ventral hernia 03/03/2014    Past Surgical History:  Procedure Laterality Date   AORTIC VALVE REPLACEMENT  11/14/2003   [facility]   APPENDECTOMY  1990   CHOLECYSTECTOMY  1990   CORONARY ANGIOGRAPHY  10/11/2021   CORONARY ARTERY BYPASS GRAFT  10/2003   HERNIA REPAIR  1999   right, inguinal   HERNIA REPAIR  2002   left, inguinal   HIP SURGERY  2006   right hip   IR CT HEAD LTD  02/03/2022   IR PERCUTANEOUS ART THROMBECTOMY/INFUSION INTRACRANIAL INC DIAG ANGIO  01/24/2022   LUMBAR LAMINECTOMY/DECOMPRESSION MICRODISCECTOMY Bilateral 02/04/2021   Procedure: Bilateral Lumbar Two-Three Laminectomy;  Surgeon: [name], MD;  Location: [facility] OR;  Service: Neurosurgery;  Laterality: Bilateral;  3C/RM 20   PILONIDAL CYST EXCISION  1964   prostate seed implant  03/2010   RADIOLOGY WITH ANESTHESIA N/A 01/24/2022   Procedure: IR WITH ANESTHESIA;  Surgeon: Radiologist, Medication, MD;  Location: [facility] OR;  Service: Radiology;  Laterality: N/A;   TEE WITHOUT CARDIOVERSION N/A 03/14/2022   Procedure: TRANSESOPHAGEAL ECHOCARDIOGRAM (TEE);  Surgeon: Parke Poisson, MD;  Location: [facility] ENDOSCOPY;  Service: Cardiology;  Laterality: N/A;   TONSILLECTOMY  childhood    Family History  Problem Relation Age of Onset   Heart disease Mother    Stroke Mother    Heart disease Father    Asperger's syndrome Son    Hyperlipidemia Son    Coronary artery disease Brother    Cancer Neg Hx        negative for colon cancer    Social History   Socioeconomic History   Marital status: Married    Spouse name: Not on file   Number of children: Not on file   Years of education: 18   Highest education level: Master's degree (e.g., MA, MS, MEng, MEd, MSW, MBA)  Occupational History   Occupation: Retired   Tobacco Use   Smoking status: Some Days    Types: Cigars    Last attempt to quit: 06/2021    Years since quitting: 1.3   Smokeless tobacco: Never   Tobacco comments:    Pt states he smokes 1 cigar about once a month. 06/01/2021    Pt quit  Cigars 06/2021  Substance and Sexual Activity   Alcohol use: Not Currently    Comment: wine maybe 1 per month   Drug use: No   Sexual activity: Never  Other Topics Concern   Not on file  Social History Narrative   ** Merged History Encounter ** Right Handed    Lives in a one sotyr home. One step leads to front     Lives with wife   Retired  Occasionally caffeine   Social Determinants of Health   Financial Resource Strain: Low Risk  (11/11/2022)   Overall Financial Resource Strain (CARDIA)    Difficulty of Paying Living Expenses: Not very hard  Food Insecurity: No Food Insecurity (11/11/2022)   Hunger Vital Sign    Worried About Running Out of Food in the Last Year: Never true    Ran Out of Food in the Last Year: Never true  Transportation Needs: No Transportation Needs (11/11/2022)   PRAPARE - Administrator, Civil Service (Medical): No    Lack of Transportation (Non-Medical): No  Physical Activity: Insufficiently Active (11/11/2022)   Exercise Vital Sign    Days of Exercise per Week: 3 days    Minutes of Exercise per Session: 30 min  Stress: No Stress Concern Present (11/11/2022)   [employer] of Occupational Health - Occupational Stress Questionnaire    Feeling of Stress : Not at all  Social Connections: Moderately Integrated (11/11/2022)   Social Connection and Isolation Panel [NHANES]    Frequency of Communication with Friends and Family: More than three times a week    Frequency of Social Gatherings with Friends and Family: Twice a week    Attends Religious Services: More than 4 times per year    Active Member of [employer] or Organizations: No    Attends Banker Meetings: Never    Marital Status:  Married  Catering manager Violence: Not At Risk (08/02/2022)   Humiliation, Afraid, Rape, and Kick questionnaire    Fear of Current or Ex-Partner: No    Emotionally Abused: No    Physically Abused: No    Sexually Abused: No    Outpatient Medications Prior to Visit  Medication Sig Dispense Refill   AMBULATORY NON FORMULARY MEDICATION cpap cushions            AirFit F20 (Size: Large)           AirFit F30 (Size: Med)   Please FAX the prescription to:  [phone] 12 each prn   aspirin EC 81 MG tablet Take 1 tablet (81 mg total) by mouth daily. Swallow whole. 30 tablet 12   betamethasone dipropionate (DIPROLENE) 0.05 % cream Apply topically 2 (two) times daily as needed. To eczema rash (Patient taking differently: Apply 1 application  topically 2 (two) times daily as needed (to eczema/rash).) 30 g 1   Blood Glucose Monitoring Suppl Supplies MISC Use for monitoring glucose level 100 each 1   clopidogrel (PLAVIX) 75 MG tablet Take 75 mg by mouth daily.     colchicine 0.6 MG tablet TAKE 1 TABLET (0.6 MG TOTAL) BY MOUTH 2 (TWO) TIMES DAILY AS NEEDED. 180 tablet 0   Continuous Blood Gluc Sensor (DEXCOM G6 SENSOR) MISC 1 Device by Does not apply route as directed. 9 each 3   dapagliflozin propanediol (FARXIGA) 10 MG TABS tablet Take 1 tablet (10 mg total) by mouth daily before breakfast. 30 tablet 0   erythromycin ophthalmic ointment 1 Application See admin instructions. Apply to affected eye(s) as directed for styes     fenofibrate (TRICOR) 145 MG tablet TAKE 1 TABLET BY MOUTH EVERY DAY 90 tablet 1   furosemide (LASIX) 40 MG tablet Take 1 tablet (40 mg total) by mouth daily. 90 tablet 3   glucose blood (ONETOUCH VERIO) test strip Use as instructed 100 strip 12   insulin aspart (NOVOLOG FLEXPEN) 100 UNIT/ML FlexPen Max daily 75 units (Patient taking differently: Inject 14-19 Units into the skin See  admin instructions. Inject 16 units into the skin with breakfast, 14 units with lunch, and 16 units  with supper/evening meal- may increase to up to 19 units per dose as needed for high blood sugar) 75 mL 3   Insulin Glargine (BASAGLAR KWIKPEN) 100 UNIT/ML Inject 22 Units into the skin daily. (Patient taking differently: Inject 22 Units into the skin at bedtime.) 30 mL 3   Insulin Pen Needle 32G X 4 MM MISC 1 Device by Does not apply route in the morning, at noon, in the evening, and at bedtime. 400 each 3   isosorbide mononitrate (IMDUR) 30 MG 24 hr tablet TAKE 1 TABLET BY MOUTH EVERY DAY 90 tablet 2   ketoconazole (NIZORAL) 2 % cream Apply 1 Application topically daily as needed for irritation.     metoprolol succinate (TOPROL-XL) 25 MG 24 hr tablet TAKE 1 TABLET BY MOUTH EVERY DAY 90 tablet 1   OneTouch Delica Lancets 33G MISC USE AS DIRECTED 100 each 1   PRESCRIPTION MEDICATION CPAP- At bedtime     rosuvastatin (CRESTOR) 40 MG tablet TAKE 1 TABLET BY MOUTH EVERY DAY 90 tablet 3   Semaglutide,0.25 or 0.5MG /DOS, 2 MG/3ML SOPN Inject 0.5 mg into the skin once a week. 9 mL 3   tacrolimus (PROTOPIC) 0.1 % ointment Apply 1 application  topically 2 (two) times daily as needed (for eczema).     tamsulosin (FLOMAX) 0.4 MG CAPS capsule Take 1 capsule (0.4 mg total) by mouth daily. 30 capsule 0   warfarin (COUMADIN) 7.5 MG tablet TAKE 1/2 TABLET TO 1 TABLET BY MOUTH DAILY AS DIRECTED BY THE COUMADIN CLINIC 105 tablet 1   pantoprazole (PROTONIX) 40 MG tablet Take 1 tablet (40 mg total) by mouth daily. 30 tablet 1   potassium chloride SA (KLOR-CON M) 20 MEQ tablet Take 1 tablet (20 mEq total) by mouth 2 (two) times daily. 2 tablet 0   No facility-administered medications prior to visit.    No Known Allergies  ROS See HPI    Objective:    Physical Exam Constitutional:      General: He is not in acute distress.    Appearance: He is well-developed.  HENT:     Head: Normocephalic and atraumatic.  Cardiovascular:     Rate and Rhythm: Normal rate and regular rhythm.     Heart sounds: No murmur  heard.    Comments: Mechanical valve click noted Pulmonary:     Effort: Pulmonary effort is normal. No respiratory distress.     Breath sounds: Normal breath sounds. No wheezing or rales.  Musculoskeletal:     Right lower leg: 2+ Edema present.     Left lower leg: 2+ Edema present.  Skin:    General: Skin is warm and dry.  Neurological:     Mental Status: He is alert and oriented to person, place, and time.  Psychiatric:        Behavior: Behavior normal.        Thought Content: Thought content normal.      BP (!) 104/53 (BP Location: Right Arm, Patient Position: Sitting, Cuff Size: Large)   Pulse 69   Temp (!) 97.4 F (36.3 C) (Oral)   Resp 16   Ht 5\' 9"  (1.753 m)   Wt 214 lb (97.1 kg)   SpO2 99%   BMI 31.60 kg/m  Wt Readings from Last 3 Encounters:  11/17/22 214 lb (97.1 kg)  11/01/22 217 lb (98.4 kg)  09/27/22 214 lb (97.1 kg)  Assessment & Plan:   Problem List Items Addressed This Visit       Unprioritized   History of aortic valve replacement (Chronic)     INR fluctuating due to inconsistent intake of greens and Coumadin. Last INR was 2.6, below the therapeutic range of 3-3.5. -Resume previous Coumadin dose and maintain consistent intake of greens. -Continue regular INR checks at Coumadin clinic.      Dyslipidemia (Chronic)     LDL 70 and total cholesterol 140 on Crestor 40mg . Triglycerides slightly elevated. -Continue Crestor 40mg .      Type 2 diabetes mellitus with stage 3a chronic kidney disease, with long-term current use of insulin [facility])    Following with Endocrinology.update A1C, continue current medications.   Lab Results  Component Value Date   HGBA1C 7.8 (H) 11/17/2022   HGBA1C 7.1 (A) 06/29/2022   HGBA1C 7.3 (H) 04/10/2022   Lab Results  Component Value Date   MICROALBUR <0.7 10/04/2021   LDLCALC 70 11/13/2022   CREATININE 1.66 (H) 11/13/2022         Relevant Orders   HgB A1c (Completed)   Left shoulder pain - Primary      Severe pain and limited mobility due to lack of cartilage. CT scan scheduled for 11/28/2022 to assess the extent of damage. Discussed potential surgical intervention and associated stroke risk. -Continue current pain management plan until CT results are available.       Other Visit Diagnoses     Needs flu shot       Relevant Orders   Flu Vaccine Trivalent High Dose (Fluad) (Completed)      General Health Maintenance -Administer influenza vaccine today. -Recommend COVID-19 booster and RSV vaccine (Arexvy) at pharmacy.  I have discontinued Davron Panciera. Reeder "Len"'s pantoprazole and potassium chloride SA. I am also having him maintain his Blood Glucose Monitoring Suppl, betamethasone dipropionate, AMBULATORY NON FORMULARY MEDICATION, Dexcom G6 Sensor, OneTouch Verio, OneTouch Delica Lancets 33G, tacrolimus, colchicine, Basaglar KwikPen, NovoLOG FlexPen, Insulin Pen Needle, tamsulosin, PRESCRIPTION MEDICATION, erythromycin, ketoconazole, aspirin EC, isosorbide mononitrate, metoprolol succinate, Semaglutide(0.25 or 0.5MG /DOS), fenofibrate, clopidogrel, furosemide, dapagliflozin propanediol, rosuvastatin, and warfarin.  No orders of the defined types were placed in this encounter.

## 2022-11-17 NOTE — Assessment & Plan Note (Signed)
Following with Endocrinology.update A1C, continue current medications.   Lab Results  Component Value Date   HGBA1C 7.8 (H) 11/17/2022   HGBA1C 7.1 (A) 06/29/2022   HGBA1C 7.3 (H) 04/10/2022   Lab Results  Component Value Date   MICROALBUR <0.7 10/04/2021   LDLCALC 70 11/13/2022   CREATININE 1.66 (H) 11/13/2022

## 2022-11-17 NOTE — Assessment & Plan Note (Signed)
  INR fluctuating due to inconsistent intake of greens and Coumadin. Last INR was 2.6, below the therapeutic range of 3-3.5. -Resume previous Coumadin dose and maintain consistent intake of greens. -Continue regular INR checks at Coumadin clinic.

## 2022-11-21 ENCOUNTER — Encounter: Payer: Self-pay | Admitting: Orthopedic Surgery

## 2022-11-21 DIAGNOSIS — G4733 Obstructive sleep apnea (adult) (pediatric): Secondary | ICD-10-CM | POA: Diagnosis not present

## 2022-11-21 DIAGNOSIS — I10 Essential (primary) hypertension: Secondary | ICD-10-CM | POA: Diagnosis not present

## 2022-11-24 ENCOUNTER — Encounter: Payer: Self-pay | Admitting: Orthopedic Surgery

## 2022-11-28 ENCOUNTER — Other Ambulatory Visit: Payer: Medicare HMO

## 2022-11-29 ENCOUNTER — Ambulatory Visit: Payer: Medicare HMO

## 2022-11-29 DIAGNOSIS — I2510 Atherosclerotic heart disease of native coronary artery without angina pectoris: Secondary | ICD-10-CM | POA: Diagnosis not present

## 2022-11-29 DIAGNOSIS — I639 Cerebral infarction, unspecified: Secondary | ICD-10-CM | POA: Diagnosis not present

## 2022-11-29 DIAGNOSIS — Z952 Presence of prosthetic heart valve: Secondary | ICD-10-CM | POA: Diagnosis not present

## 2022-11-29 DIAGNOSIS — I129 Hypertensive chronic kidney disease with stage 1 through stage 4 chronic kidney disease, or unspecified chronic kidney disease: Secondary | ICD-10-CM | POA: Diagnosis not present

## 2022-11-29 DIAGNOSIS — E1122 Type 2 diabetes mellitus with diabetic chronic kidney disease: Secondary | ICD-10-CM | POA: Diagnosis not present

## 2022-11-29 DIAGNOSIS — R339 Retention of urine, unspecified: Secondary | ICD-10-CM | POA: Diagnosis not present

## 2022-11-29 DIAGNOSIS — N1832 Chronic kidney disease, stage 3b: Secondary | ICD-10-CM | POA: Diagnosis not present

## 2022-12-01 ENCOUNTER — Ambulatory Visit: Payer: Medicare HMO | Attending: Cardiology

## 2022-12-01 DIAGNOSIS — Z952 Presence of prosthetic heart valve: Secondary | ICD-10-CM

## 2022-12-01 DIAGNOSIS — Z5181 Encounter for therapeutic drug level monitoring: Secondary | ICD-10-CM | POA: Diagnosis not present

## 2022-12-01 LAB — POCT INR: INR: 4.7 — AB (ref 2.0–3.0)

## 2022-12-01 NOTE — Patient Instructions (Signed)
Description   HOLD today's dose and eat a serving of greens and then continue taking 1 tablet daily except 1/2 tablet on Mondays, Wednesdays, and Fridays.  Remain consistent with leafy green (increase intake to 4 servings/week).  Recheck INR in 2 weeks.  Coumadin Clinic [phone].

## 2022-12-11 ENCOUNTER — Encounter: Payer: Self-pay | Admitting: Orthopedic Surgery

## 2022-12-11 ENCOUNTER — Ambulatory Visit: Payer: Medicare HMO | Attending: Cardiology | Admitting: *Deleted

## 2022-12-11 DIAGNOSIS — Z7901 Long term (current) use of anticoagulants: Secondary | ICD-10-CM | POA: Diagnosis not present

## 2022-12-11 DIAGNOSIS — Z952 Presence of prosthetic heart valve: Secondary | ICD-10-CM

## 2022-12-11 LAB — POCT INR: INR: 3.4 — AB (ref 2.0–3.0)

## 2022-12-11 NOTE — Patient Instructions (Signed)
Description   Continue taking warfarin 1 tablet daily except 1/2 tablet on Mondays, Wednesdays, and Fridays. Remain consistent with leafy green (increase intake to 4 servings/week).  Recheck INR in 2 weeks.  Coumadin Clinic [phone].

## 2022-12-18 ENCOUNTER — Encounter: Payer: Self-pay | Admitting: Orthopedic Surgery

## 2022-12-18 DIAGNOSIS — E119 Type 2 diabetes mellitus without complications: Secondary | ICD-10-CM | POA: Diagnosis not present

## 2022-12-20 ENCOUNTER — Encounter: Payer: Self-pay | Admitting: Family

## 2022-12-20 ENCOUNTER — Encounter: Payer: Self-pay | Admitting: Internal Medicine

## 2022-12-20 DIAGNOSIS — G4733 Obstructive sleep apnea (adult) (pediatric): Secondary | ICD-10-CM | POA: Diagnosis not present

## 2022-12-20 DIAGNOSIS — I10 Essential (primary) hypertension: Secondary | ICD-10-CM | POA: Diagnosis not present

## 2022-12-20 DIAGNOSIS — M25512 Pain in left shoulder: Secondary | ICD-10-CM

## 2022-12-21 ENCOUNTER — Other Ambulatory Visit: Payer: Medicare HMO

## 2022-12-22 NOTE — Telephone Encounter (Signed)
Patient's wife, Arlene Ivers, came in to office today and picked up 4 boxes of patient assistance Ozempic.

## 2022-12-25 ENCOUNTER — Other Ambulatory Visit: Payer: Self-pay | Admitting: Orthopedic Surgery

## 2022-12-25 DIAGNOSIS — Z01818 Encounter for other preprocedural examination: Secondary | ICD-10-CM

## 2022-12-25 DIAGNOSIS — M19012 Primary osteoarthritis, left shoulder: Secondary | ICD-10-CM | POA: Diagnosis not present

## 2022-12-26 ENCOUNTER — Encounter: Payer: Self-pay | Admitting: Cardiology

## 2022-12-27 ENCOUNTER — Ambulatory Visit: Payer: Medicare HMO | Attending: Cardiology | Admitting: *Deleted

## 2022-12-27 DIAGNOSIS — Z7901 Long term (current) use of anticoagulants: Secondary | ICD-10-CM | POA: Diagnosis not present

## 2022-12-27 DIAGNOSIS — Z952 Presence of prosthetic heart valve: Secondary | ICD-10-CM

## 2022-12-27 LAB — POCT INR: INR: 3.5 — AB (ref 2.0–3.0)

## 2022-12-27 NOTE — Patient Instructions (Signed)
Description   Continue taking warfarin 1 tablet daily except 1/2 tablet on Mondays, Wednesdays, and Fridays. Remain consistent with leafy green (increase intake to 4 servings/week).  Recheck INR in 4 weeks.  Coumadin Clinic [phone].

## 2023-01-01 ENCOUNTER — Ambulatory Visit: Payer: Medicare HMO

## 2023-01-01 ENCOUNTER — Other Ambulatory Visit: Payer: Medicare HMO

## 2023-01-01 ENCOUNTER — Encounter: Payer: Self-pay | Admitting: Orthopedic Surgery

## 2023-01-01 ENCOUNTER — Ambulatory Visit
Admission: RE | Admit: 2023-01-01 | Discharge: 2023-01-01 | Disposition: A | Discharge status: Home / Self Care | Payer: Medicare HMO | Source: Ambulatory Visit | Attending: Orthopedic Surgery | Admitting: Orthopedic Surgery

## 2023-01-01 DIAGNOSIS — Z01818 Encounter for other preprocedural examination: Secondary | ICD-10-CM

## 2023-01-01 DIAGNOSIS — M19012 Primary osteoarthritis, left shoulder: Secondary | ICD-10-CM | POA: Diagnosis not present

## 2023-01-01 DIAGNOSIS — M25512 Pain in left shoulder: Secondary | ICD-10-CM | POA: Diagnosis not present

## 2023-01-02 ENCOUNTER — Encounter: Payer: Self-pay | Admitting: Physician Assistant

## 2023-01-02 ENCOUNTER — Encounter: Payer: Self-pay | Admitting: Internal Medicine

## 2023-01-02 ENCOUNTER — Ambulatory Visit: Payer: Medicare HMO | Admitting: Internal Medicine

## 2023-01-02 VITALS — BP 136/60 | HR 61 | Resp 20 | Ht 69.0 in | Wt 220.0 lb

## 2023-01-02 DIAGNOSIS — E1122 Type 2 diabetes mellitus with diabetic chronic kidney disease: Secondary | ICD-10-CM | POA: Diagnosis not present

## 2023-01-02 DIAGNOSIS — E1165 Type 2 diabetes mellitus with hyperglycemia: Secondary | ICD-10-CM

## 2023-01-02 DIAGNOSIS — N1831 Chronic kidney disease, stage 3a: Secondary | ICD-10-CM

## 2023-01-02 DIAGNOSIS — Z794 Long term (current) use of insulin: Secondary | ICD-10-CM | POA: Diagnosis not present

## 2023-01-02 DIAGNOSIS — E1159 Type 2 diabetes mellitus with other circulatory complications: Secondary | ICD-10-CM

## 2023-01-02 DIAGNOSIS — E1142 Type 2 diabetes mellitus with diabetic polyneuropathy: Secondary | ICD-10-CM

## 2023-01-02 MED ORDER — SEMAGLUTIDE (2 MG/DOSE) 8 MG/3ML ~~LOC~~ SOPN: 2.0000 mg | PEN_INJECTOR | SUBCUTANEOUS | Status: DC

## 2023-01-02 NOTE — Progress Notes (Unsigned)
Name: [name]  Age/ Sex: 77 y.o., male   MRN/ DOB: [id number], 1945/05/04     PCP: Sandford Craze, NP   Reason for Endocrinology Evaluation: Type 2 Diabetes Mellitus  Initial Endocrine Consultative Visit: 11/11/2018    PATIENT IDENTIFIER: [name] is a 77 y.o. male with a past medical history of T2DM, OSA, HTN, CAD ( S/P CABG) . The patient has followed with Endocrinology clinic since 11/11/2018 for consultative assistance with management of his diabetes.  DIABETIC HISTORY:  Mr. Seegars was diagnosed with T2DM in 2005. He was on metformin which was stopped due to renal dysfunction . Has been on insulin since 2015. His hemoglobin A1c has ranged from 6.3% in 2015, peaking at 8.3% in 2020.  On his initial visit to our clinic he was on basal insulin only. With an A1c of 8.3. Trulicity was added.    Novolog started 04/2020 due to an A1c 9.6%  Stopped trulicity due to cost  Started Farxiga 07/2020 through pt assistance program   Started Ozempic 05/2022  SUBJECTIVE:   During the last visit (06/29/2022): A1c of 7.1%.    Today (01/02/2023): [name] is here for a  follow up on diabetes management. He is accompanied by his wife today . He checks his blood sugars multiple  times daily, through dexcom.  The patient has not had hypoglycemic episodes since the last clinic visit.  He has been following up with Ortho for osteoarthritis of the left shoulder He was seen by cardiology 10/2022 for prior aortic valve replacement and thoracic aortic aneurysm monitoring as well as dyslipidemia and A.Fib Patient continues to follow-up with cardiology and neurology as well as urology, he is off foley, on catheter 2- 4 x a day    Denies nausea, or diarrhea Denies constipation or diarrhea     HOME DIABETES REGIMEN:  Farxiga 10 mg daily-patient assistance Ozempic 1 mg weekly-patient assistance Basaglar 20  units daily  Novolog 16/14/16 units TIDQAC Cf: Novlog  (bG-130/25)      CONTINUOUS GLUCOSE MONITORING RECORD INTERPRETATION: Unable to download      DIABETIC COMPLICATIONS: Microvascular complications:  CKDIII- Dr. Signe Colt  Denies: neuropathy, retinopathy  Last eye exam: Completed 03/25/2020    Macrovascular complications:  CAD (S/P CABG )  Denies: PVD, CVA    HISTORY:  Past Medical History:  Past Medical History:  Diagnosis Date   Actinic keratosis 02/14/2011   Allergy    Aneurysm of ascending aorta without rupture (HCC) 01/06/2022   Aortic stenosis    Arthritis    Bilateral sensorineural hearing loss 06/16/2009   CAD (coronary artery disease)    cabg   Complication of anesthesia    Coronary atherosclerosis of native coronary artery 06/15/2009   Diabetic polyneuropathy 11/11/2018   Dyslipidemia 11/05/2018   Embolic stroke involving left middle cerebral artery  05/30/2021   Erectile dysfunction 05/18/2009   Essential hypertension 06/16/2009   Expressive aphasia 06/25/2021   External hemorrhoids 03/21/2010   Gout 04/09/2015   Heart murmur    Hemochromatosis 02/14/2011   History of aortic valve replacement 10/13/2009   History of CABG 11/05/2018   2005- 1. Coronary artery bypass grafting x4 with LIMA-LAD, SVG-OM1, SVG-AM and dCFX.  2. Aortic valve replacement with [employer] Jude AVR 3. Septal myomectomy.  4. Myoview low risk 2016   History of hepatitis 01/31/1972   Hypertriglyceridemia 06/13/2010   Hypertrophic obstructive cardiomyopathy 05/06/2008   SP septal myomectomy at surgery 2005 Echo Nov 2018-  Hyperdynamic LVEF with severe basal septal hypertrophy. There is chordal SAM with resting gradient of 16 mmHg that increases to 85 mmHg with Valsalva        Leg weakness, bilateral 01/18/2021   Long term (current) use of anticoagulants 05/22/2018   Lumbar stenosis with neurogenic claudication 02/04/2021   Malignant neoplasm of prostate 06/17/2009   Mechanical heart valve present    Manufacturer: [employer] Duty #: [id number]   Model #: [id number]. Card states MRI compatible with 3 teslas or less.   Obesity, unspecified 04/29/2008   OSA (obstructive sleep apnea) 07/12/2012   moderate-severe; uses CPAP nightly   PONV (postoperative nausea and vomiting)    after CABG- slow to wake up   Presence of prosthetic heart valve    Primary osteoarthritis of left shoulder 10/31/2018   S/P lumbar laminectomy 02/04/2021   Stage 3 chronic kidney disease due to type 2 diabetes mellitus 11/11/2018   Tobacco use 06/16/2009   Type 2 diabetes mellitus with hyperglycemia    Ventral hernia 03/03/2014   Past Surgical History:  Past Surgical History:  Procedure Laterality Date   AORTIC VALVE REPLACEMENT  11/14/2003   [employer] [facility]   APPENDECTOMY  1990   CHOLECYSTECTOMY  1990   CORONARY ANGIOGRAPHY  10/11/2021   CORONARY ARTERY BYPASS GRAFT  10/2003   HERNIA REPAIR  1999   right, inguinal   HERNIA REPAIR  2002   left, inguinal   HIP SURGERY  2006   right hip   IR CT HEAD LTD  02/03/2022   IR PERCUTANEOUS ART THROMBECTOMY/INFUSION INTRACRANIAL INC DIAG ANGIO  01/24/2022   LUMBAR LAMINECTOMY/DECOMPRESSION MICRODISCECTOMY Bilateral 02/04/2021   Procedure: Bilateral Lumbar Two-Three Laminectomy;  Surgeon: [name], MD;  Location: [facility] OR;  Service: Neurosurgery;  Laterality: Bilateral;  3C/RM 20   PILONIDAL CYST EXCISION  1964   prostate seed implant  03/2010   RADIOLOGY WITH ANESTHESIA N/A 01/24/2022   Procedure: IR WITH ANESTHESIA;  Surgeon: Radiologist, Medication, MD;  Location: [facility] OR;  Service: Radiology;  Laterality: N/A;   TEE WITHOUT CARDIOVERSION N/A 03/14/2022   Procedure: TRANSESOPHAGEAL ECHOCARDIOGRAM (TEE);  Surgeon: Parke Poisson, MD;  Location: [facility] ENDOSCOPY;  Service: Cardiology;  Laterality: N/A;   TONSILLECTOMY  childhood   Social History:  reports that he has been smoking cigars. He has never used smokeless tobacco. He reports that he does not currently use alcohol. He reports that he does not use  drugs. Family History:  Family History  Problem Relation Age of Onset   Heart disease Mother    Stroke Mother    Heart disease Father    Asperger's syndrome Son    Hyperlipidemia Son    Coronary artery disease Brother    Cancer Neg Hx        negative for colon cancer     HOME MEDICATIONS: Allergies as of 01/02/2023   No Known Allergies      Medication List        Accurate as of January 02, 2023  3:15 PM. If you have any questions, ask your nurse or doctor.          STOP taking these medications    Semaglutide(0.25 or 0.5MG /DOS) 2 MG/3ML Sopn Replaced by: Semaglutide (2 MG/DOSE) 8 MG/3ML Sopn Stopped by: Johnney Ou Keyshla Tunison       TAKE these medications    AMBULATORY NON FORMULARY MEDICATION cpap cushions            AirFit F20 (Size: Large)  AirFit F30 (Size: Med)   Please FAX the prescription to:  [phone]   aspirin EC 81 MG tablet Take 1 tablet (81 mg total) by mouth daily. Swallow whole.   Basaglar KwikPen 100 UNIT/ML Inject 22 Units into the skin daily. What changed: when to take this   betamethasone dipropionate 0.05 % cream Apply topically 2 (two) times daily as needed. To eczema rash What changed:  how much to take reasons to take this additional instructions   Blood Glucose Monitoring Suppl Supplies Misc Use for monitoring glucose level   clopidogrel 75 MG tablet Commonly known as: PLAVIX Take 75 mg by mouth daily.   colchicine 0.6 MG tablet TAKE 1 TABLET (0.6 MG TOTAL) BY MOUTH 2 (TWO) TIMES DAILY AS NEEDED.   dapagliflozin propanediol 10 MG Tabs tablet Commonly known as: Farxiga Take 1 tablet (10 mg total) by mouth daily before breakfast.   Dexcom G6 Sensor Misc 1 Device by Does not apply route as directed.   erythromycin ophthalmic ointment 1 Application See admin instructions. Apply to affected eye(s) as directed for styes   fenofibrate 145 MG tablet Commonly known as: TRICOR TAKE 1 TABLET BY MOUTH EVERY  DAY   furosemide 40 MG tablet Commonly known as: LASIX Take 1 tablet (40 mg total) by mouth daily.   Insulin Pen Needle 32G X 4 MM Misc 1 Device by Does not apply route in the morning, at noon, in the evening, and at bedtime.   isosorbide mononitrate 30 MG 24 hr tablet Commonly known as: IMDUR TAKE 1 TABLET BY MOUTH EVERY DAY   ketoconazole 2 % cream Commonly known as: NIZORAL Apply 1 Application topically daily as needed for irritation.   metoprolol succinate 25 MG 24 hr tablet Commonly known as: TOPROL-XL TAKE 1 TABLET BY MOUTH EVERY DAY   NovoLOG FlexPen 100 UNIT/ML FlexPen Generic drug: insulin aspart Max daily 75 units What changed:  how much to take how to take this when to take this additional instructions   OneTouch Delica Lancets 33G Misc USE AS DIRECTED   OneTouch Verio test strip Generic drug: glucose blood Use as instructed   PRESCRIPTION MEDICATION CPAP- At bedtime   rosuvastatin 40 MG tablet Commonly known as: CRESTOR TAKE 1 TABLET BY MOUTH EVERY DAY   Semaglutide (2 MG/DOSE) 8 MG/3ML Sopn Inject 2 mg as directed once a week. Replaces: Semaglutide(0.25 or 0.5MG /DOS) 2 MG/3ML Sopn Started by: Johnney Ou Pruitt Taboada   tacrolimus 0.1 % ointment Commonly known as: PROTOPIC Apply 1 application  topically 2 (two) times daily as needed (for eczema).   tamsulosin 0.4 MG Caps capsule Commonly known as: FLOMAX Take 1 capsule (0.4 mg total) by mouth daily.   warfarin 7.5 MG tablet Commonly known as: COUMADIN Take as directed by the anticoagulation clinic. If you are unsure how to take this medication, talk to your nurse or doctor. Original instructions: TAKE 1/2 TABLET TO 1 TABLET BY MOUTH DAILY AS DIRECTED BY THE COUMADIN CLINIC         OBJECTIVE:   Vital Signs: BP 136/60 (BP Location: Left Arm, Patient Position: Sitting, Cuff Size: Large)   Pulse 61   Resp 20   Ht 5\' 9"  (1.753 m)   Wt 220 lb (99.8 kg)   SpO2 97%   BMI 32.49 kg/m   Wt  Readings from Last 3 Encounters:  01/02/23 220 lb (99.8 kg)  11/17/22 214 lb (97.1 kg)  11/01/22 217 lb (98.4 kg)     Exam: General: Pt appears  well and is in NAD  Lungs: Clear with good BS bilat   Heart: RRR   Extremities: 1+  pretibial edema.   Neuro: MS is good with appropriate affect, pt is alert and Ox3      DM Foot Exam 06/29/2022  The skin of the feet is intact without sores or ulcerations. The pedal pulses are 1+ on right and 1+ on left. The sensation is decreased  to a screening 5.07, 10 gram monofilament bilaterally    DATA REVIEWED:  Lab Results  Component Value Date   HGBA1C 7.8 (H) 11/17/2022   HGBA1C 7.1 (A) 06/29/2022   HGBA1C 7.3 (H) 04/10/2022    Latest Reference Range & Units 11/13/22 08:33  Sodium 134 - 144 mmol/L 142  Potassium 3.5 - 5.2 mmol/L 4.7  Chloride 96 - 106 mmol/L 103  CO2 20 - 29 mmol/L 21  Glucose 70 - 99 mg/dL 161 (H)  BUN 8 - 27 mg/dL 32 (H)  Creatinine 0.96 - 1.27 mg/dL 0.45 (H)  Calcium 8.6 - 10.2 mg/dL 9.2  BUN/Creatinine Ratio 10 - 24  19  eGFR >59 mL/min/1.73 42 (L)  Alkaline Phosphatase 44 - 121 IU/L 59  Albumin 3.8 - 4.8 g/dL 4.2  AST 0 - 40 IU/L 26  ALT 0 - 44 IU/L 20  Total Protein 6.0 - 8.5 g/dL 7.3  Total Bilirubin 0.0 - 1.2 mg/dL 0.3    Latest Reference Range & Units 11/13/22 08:33  Total CHOL/HDL Ratio 0.0 - 5.0 ratio 5.2 (H)  Cholesterol, Total 100 - 199 mg/dL 409  HDL Cholesterol >81 mg/dL 27 (L)  Triglycerides 0 - 149 mg/dL 191 (H)  VLDL Cholesterol Cal 5 - 40 mg/dL 43 (H)  LDL Chol Calc (NIH) 0 - 99 mg/dL 70    ASSESSMENT / PLAN / RECOMMENDATIONS:   1) Type 2 Diabetes Mellitus, Sub-Optimally  controlled, With CKD III , Neuropathic and Macrovascular complications - Most recent A1c of 7.8 %. Goal A1c < 7.5%.   -A1c has trended up again, patient admits to dietary indiscretions recently -He is tolerating Ozempic, will increase the dose as below -He was provided with a new patient assistance form for  Farxiga -No changes to insulin regimen at this time  MEDICATIONS: - Increase  Ozempic 2 mg once weekly -Continue  Farxiga 10 mg , 1 tablet every morning  -Continue  Basaglar 20 units daily  -Continue  Novolog 16 units with Breakfast, 14 units with lunch and 16 units with Supper -Continue Correction Scale : Humalog (BG -130/25)     EDUCATION / INSTRUCTIONS: BG monitoring instructions: Patient is instructed to check his blood sugars 3 times a day, before each meal. Call [facility] if: BG persistently < 70 I reviewed the Rule of 15 for the treatment of hypoglycemia in detail with the patient. Literature supplied.   2) Diabetic complications:  Eye: Does not have known diabetic retinopathy. Neuro/ Feet: Does have known diabetic peripheral neuropathy. Renal: Patient does have known baseline CKD. He is on an ACEI/ARB at present. He sees Dr. Signe Colt    F/U in 4 months    Signed electronically by: Lyndle Herrlich, MD  [facility]  [facility] [address] [place] [place], [place] [address] Phone: [phone] FAX: [phone]   CC: Sandford Craze, NP [address] [place] [address] Phone: [phone]  Fax: [phone]  Return to Endocrinology clinic as below: Future Appointments  Date Time Provider Department Center  01/25/2023 11:00 AM [facility] None  05/04/2023 11:50 AM Coraline Talwar, Konrad Dolores, MD [facility] None  05/23/2023 11:20 AM Sandford Craze, NP [facility]  10/02/2023 11:30 AM Gwynneth Munson Sung Amabile, PA-C [facility] None

## 2023-01-02 NOTE — Patient Instructions (Addendum)
-   Increase Ozempic 2 mg weekly - Continue Farxiga 10 mg , 1 tablet every morning  - Continue  Basaglar 20 units daily  - Continue  Novolog to 16 units with Breakfast, 14 units with Lunch and 16 units with Supper   Novolog correctional insulin: ADD extra units on insulin to your meal-time Novolog dose if your blood sugars are higher than 185. Use the scale below to help guide you:   Blood sugar before meal Number of units to inject  Less than 185 0 unit  186 -  210 1 units  211 -  235 2 units  236 -  260 3 units  261 -  285 4 units  286 -  310 5 units  311 -  335 6 units  336 -  360 7 units  361 -  385 8 units  386 - 410  9 units       HOW TO TREAT LOW BLOOD SUGARS (Blood sugar LESS THAN 70 MG/DL) Please follow the RULE OF 15 for the treatment of hypoglycemia treatment (when your (blood sugars are less than 70 mg/dL)   STEP 1: Take 15 grams of carbohydrates when your blood sugar is low, which includes:  3-4 GLUCOSE TABS  OR 3-4 OZ OF JUICE OR REGULAR SODA OR ONE TUBE OF GLUCOSE GEL    STEP 2: RECHECK blood sugar in 15 MINUTES STEP 3: If your blood sugar is still low at the 15 minute recheck --> then, go back to STEP 1 and treat AGAIN with another 15 grams of carbohydrates.

## 2023-01-07 ENCOUNTER — Other Ambulatory Visit: Payer: Self-pay | Admitting: Family

## 2023-01-08 NOTE — Patient Outreach (Signed)
  Care Coordination   Case closed  Visit Note   01/08/2023 Name: ENZO SIMONIN MRN: [id number] DOB: 11-Feb-1945  XZANDER CRUMRINE is a 77 y.o. year old male who sees Sandford Craze, NP for primary care. No patient contact was made during this encounter.  Documentation encounter: Per Care guide note from 05/08/22, "We have been unable to make contact with the patient for follow up".  RNCM closed Goals.  Goals Addressed             This Visit's Progress    COMPLETED: Health Management       Interventions Today    Flowsheet Row Most Recent Value  General Interventions   General Interventions Discussed/Reviewed General Interventions Reviewed  [per review of chart Care Guide note from 05/08/22-  "We have been unable to make contact with the patient for follow up." Goals closed.]           SDOH assessments and interventions completed:  No  Care Coordination Interventions:  No, not indicated   Follow up plan: No further intervention required.   Encounter Outcome:  Patient Visit Completed   Kathyrn Sheriff, RN, MSN, BSN, CCM Care Management Coordinator [phone]

## 2023-01-10 ENCOUNTER — Telehealth: Payer: Self-pay | Admitting: Physician Assistant

## 2023-01-10 ENCOUNTER — Telehealth: Payer: Self-pay | Admitting: *Deleted

## 2023-01-10 DIAGNOSIS — Z7901 Long term (current) use of anticoagulants: Secondary | ICD-10-CM

## 2023-01-10 DIAGNOSIS — M19012 Primary osteoarthritis, left shoulder: Secondary | ICD-10-CM | POA: Diagnosis not present

## 2023-01-10 NOTE — Telephone Encounter (Signed)
[name]  We have received a surgical clearance request for Mr Delosangeles will be undergoing left total shoulder replacement.  He has a PMH of CVA suffered 05/2021 as well as CAD s/p CABG x 3 with NSTEMI 09/2021 in [place] with LHC showing occluded saphenous vein graft to left circumflex as well as RCA and occluded graft to OM treated medically.  Can you please comment on  guidance on holding Plavix and aspirin prior to upcoming shoulder replacement procedure.. Please forward you guidance and recommendations to P CV DIV PREOP   Thanks, Robin Searing, NP

## 2023-01-10 NOTE — Telephone Encounter (Signed)
   Pre-operative Risk Assessment    Patient Name: [name]  DOB: [birth year] MRN: [id number]      Request for Surgical Clearance    Procedure:   LEFT REVERSE TOTAL SHOULDER REPLACEMENT  Date of Surgery:  Clearance TBD                                 Surgeon:  Teryl Lucy Surgeon's Group or Practice Name:  [facility] Phone number:  [phone] Fax number:  [phone]   Type of Clearance Requested:   - Medical  - Pharmacy:  Hold Aspirin, Clopidogrel (Plavix), and Warfarin (Coumadin) NOT INDICATED   Type of Anesthesia:  General WITH INTERSCALENE BLOCK   Additional requests/questions:    Wilhemina Cash   01/10/2023, 1:35 PM

## 2023-01-10 NOTE — Telephone Encounter (Signed)
We got a surgical clearance from [employer]  placed in [place] box

## 2023-01-10 NOTE — Telephone Encounter (Signed)
Pharmacy please advise on holding Coumadin prior to left total shoulder replacement scheduled for TBD. Thank you.

## 2023-01-11 ENCOUNTER — Encounter: Payer: Self-pay | Admitting: Physician Assistant

## 2023-01-11 ENCOUNTER — Encounter: Payer: Self-pay | Admitting: Internal Medicine

## 2023-01-11 ENCOUNTER — Encounter: Payer: Self-pay | Admitting: Family

## 2023-01-11 DIAGNOSIS — E1159 Type 2 diabetes mellitus with other circulatory complications: Secondary | ICD-10-CM

## 2023-01-11 DIAGNOSIS — Z794 Long term (current) use of insulin: Secondary | ICD-10-CM

## 2023-01-11 NOTE — Addendum Note (Signed)
Addended by: Malena Peer D on: 01/11/2023 03:00 PM   Modules accepted: Orders

## 2023-01-11 NOTE — Telephone Encounter (Signed)
Patient with diagnosis of mechanical aortic valve and stroke on warfarin for anticoagulation.    Procedure: LEFT REVERSE TOTAL SHOULDER REPLACEMENT  Date of procedure: TBD   CrCl 43 ml/min Platelet count : will need updated CBC prior to bridge  Per office protocol, patient can hold warfarin for 5 days prior to procedure.   Patient WILL need bridging with Lovenox (enoxaparin) around procedure.  He will need CBC prior to bridging. Will place orders. Bridge coordinated by Coumadin clinic.  **This guidance is not considered finalized until pre-operative APP has relayed final recommendations.**

## 2023-01-11 NOTE — Telephone Encounter (Signed)
   Name: [name]  DOB: [birth year]  MRN: [id number]  Primary Cardiologist: Olga Millers, MD   Preoperative team, please contact this patient and set up a phone call appointment for further preoperative risk assessment. Please obtain consent and complete medication review. Thank you for your help.  I confirm that guidance regarding antiplatelet and oral anticoagulation therapy has been completed and, if necessary, noted below.  Per office protocol, patient can hold warfarin for 5 days prior to procedure.   Patient WILL need bridging with Lovenox (enoxaparin) around procedure.   He will need CBC prior to bridging. Will place orders. Bridge coordinated by Coumadin clinic.  I also confirmed the patient resides in the state of [place]. As per [facility] Medical Board telemedicine laws, the patient must reside in the state in which the provider is licensed.   Joni Reining, NP 01/11/2023, 3:05 PM [facility]

## 2023-01-12 ENCOUNTER — Telehealth: Payer: Self-pay | Admitting: *Deleted

## 2023-01-12 NOTE — Telephone Encounter (Signed)
S/w pt's spouse per DPR is set up for tele appt for pre op.  Med rec and consent done. Will send to update surgeons office and remove from preop.    Patient Consent for Virtual Visit         [name] has provided verbal consent on 01/12/2023 for a virtual visit (video or telephone).   CONSENT FOR VIRTUAL VISIT FOR:  [name]  By participating in this virtual visit I agree to the following:  I hereby voluntarily request, consent and authorize [facility] and its employed or contracted physicians, physician assistants, nurse practitioners or other licensed health care professionals (the Practitioner), to provide me with telemedicine health care services (the "Services") as deemed necessary by the treating Practitioner. I acknowledge and consent to receive the Services by the Practitioner via telemedicine. I understand that the telemedicine visit will involve communicating with the Practitioner through live audiovisual communication technology and the disclosure of certain medical information by electronic transmission. I acknowledge that I have been given the opportunity to request an in-person assessment or other available alternative prior to the telemedicine visit and am voluntarily participating in the telemedicine visit.  I understand that I have the right to withhold or withdraw my consent to the use of telemedicine in the course of my care at any time, without affecting my right to future care or treatment, and that the Practitioner or I may terminate the telemedicine visit at any time. I understand that I have the right to inspect all information obtained and/or recorded in the course of the telemedicine visit and may receive copies of available information for a reasonable fee.  I understand that some of the potential risks of receiving the Services via telemedicine include:  Delay or interruption in medical evaluation due to technological equipment failure or  disruption; Information transmitted may not be sufficient (e.g. poor resolution of images) to allow for appropriate medical decision making by the Practitioner; and/or  In rare instances, security protocols could fail, causing a breach of personal health information.  Furthermore, I acknowledge that it is my responsibility to provide information about my medical history, conditions and care that is complete and accurate to the best of my ability. I acknowledge that Practitioner's advice, recommendations, and/or decision may be based on factors not within their control, such as incomplete or inaccurate data provided by me or distortions of diagnostic images or specimens that may result from electronic transmissions. I understand that the practice of medicine is not an exact science and that Practitioner makes no warranties or guarantees regarding treatment outcomes. I acknowledge that a copy of this consent can be made available to me via my patient portal [facility] MyChart), or I can request a printed copy by calling the office of  [facility].    I understand that my insurance will be billed for this visit.   I have read or had this consent read to me. I understand the contents of this consent, which adequately explains the benefits and risks of the Services being provided via telemedicine.  I have been provided ample opportunity to ask questions regarding this consent and the Services and have had my questions answered to my satisfaction. I give my informed consent for the services to be provided through the use of telemedicine in my medical care

## 2023-01-17 ENCOUNTER — Other Ambulatory Visit: Payer: Self-pay | Admitting: Family

## 2023-01-17 ENCOUNTER — Ambulatory Visit (INDEPENDENT_AMBULATORY_CARE_PROVIDER_SITE_OTHER): Payer: Medicare HMO | Admitting: Family

## 2023-01-17 ENCOUNTER — Ambulatory Visit (HOSPITAL_BASED_OUTPATIENT_CLINIC_OR_DEPARTMENT_OTHER)
Admission: RE | Admit: 2023-01-17 | Discharge: 2023-01-17 | Disposition: A | Discharge status: Home / Self Care | Payer: Medicare HMO | Source: Ambulatory Visit | Attending: Family | Admitting: Family

## 2023-01-17 ENCOUNTER — Other Ambulatory Visit: Payer: Self-pay | Admitting: Cardiology

## 2023-01-17 VITALS — BP 126/47 | HR 73 | Temp 98.8°F | Resp 16 | Ht 69.0 in | Wt 216.0 lb

## 2023-01-17 DIAGNOSIS — Z01818 Encounter for other preprocedural examination: Secondary | ICD-10-CM | POA: Insufficient documentation

## 2023-01-17 DIAGNOSIS — Z7901 Long term (current) use of anticoagulants: Secondary | ICD-10-CM | POA: Diagnosis not present

## 2023-01-17 DIAGNOSIS — Z952 Presence of prosthetic heart valve: Secondary | ICD-10-CM

## 2023-01-17 DIAGNOSIS — E119 Type 2 diabetes mellitus without complications: Secondary | ICD-10-CM | POA: Diagnosis not present

## 2023-01-17 DIAGNOSIS — I480 Paroxysmal atrial fibrillation: Secondary | ICD-10-CM

## 2023-01-17 DIAGNOSIS — E1122 Type 2 diabetes mellitus with diabetic chronic kidney disease: Secondary | ICD-10-CM | POA: Diagnosis not present

## 2023-01-17 DIAGNOSIS — N1831 Chronic kidney disease, stage 3a: Secondary | ICD-10-CM

## 2023-01-17 DIAGNOSIS — Z794 Long term (current) use of insulin: Secondary | ICD-10-CM | POA: Diagnosis not present

## 2023-01-17 DIAGNOSIS — I152 Hypertension secondary to endocrine disorders: Secondary | ICD-10-CM

## 2023-01-17 NOTE — Telephone Encounter (Signed)
Prescription refill request received for warfarin Lov: 11/01/22 [name])  Next INR check: 01/25/23 Warfarin tablet strength: 7.5mg   Appropriate dose. Refill sent.

## 2023-01-17 NOTE — Progress Notes (Signed)
Subjective:     Patient ID: ACEN MCVEIGH, male    DOB: 05-Oct-1945, 77 y.o.   MRN: [id number]  Chief Complaint  Patient presents with   Pre-op Exam    HPI  Discussed the use of AI scribe software for clinical note transcription with the patient, who gave verbal consent to proceed.  History of Present Illness   The patient, with a history of stroke, heart attack, and kidney issues, is scheduled for a left reverse total shoulder replacement due to chronic pain. The pain has been progressively worsening over the years and has reached a point where it is causing significant discomfort. The patient describes the pain as feeling like "bone on bone" and occasionally experiences a popping sensation. The patient also reports having sleep apnea and hearing issues, which are managed with hearing aids. The patient's A1c levels are being monitored, and there is a concern about the patient's INR levels in relation to the upcoming surgery. The patient is on Coumadin, which is managed by a Coumadin clinic. The patient is also scheduled to see a cardiologist for further evaluation. Lab Results  Component Value Date   HGBA1C 7.8 (H) 11/17/2022       Health Maintenance Due  Topic Date Due   Zoster Vaccines- Shingrix (1 of 2) 03/22/1964   DTaP/Tdap/Td (3 - Td or Tdap) 03/01/2022   COVID-19 Vaccine (5 - 2024-25 season) 10/01/2022   Diabetic kidney evaluation - Urine ACR  10/05/2022    Past Medical History:  Diagnosis Date   Actinic keratosis 02/14/2011   Allergy    Aneurysm of ascending aorta without rupture (HCC) 01/06/2022   Aortic stenosis    Arthritis    Bilateral sensorineural hearing loss 06/16/2009   CAD (coronary artery disease)    cabg   Complication of anesthesia    Coronary atherosclerosis of native coronary artery 06/15/2009   Diabetic polyneuropathy 11/11/2018   Dyslipidemia 11/05/2018   Embolic stroke involving left middle cerebral artery  05/30/2021   Erectile dysfunction  05/18/2009   Essential hypertension 06/16/2009   Expressive aphasia 06/25/2021   External hemorrhoids 03/21/2010   Gout 04/09/2015   Heart murmur    Hemochromatosis 02/14/2011   History of aortic valve replacement 10/13/2009   History of CABG 11/05/2018   2005- 1. Coronary artery bypass grafting x4 with LIMA-LAD, SVG-OM1, SVG-AM and dCFX.  2. Aortic valve replacement with St Jude AVR 3. Septal myomectomy.  4. Myoview low risk 2016   History of hepatitis 01/31/1972   Hypertriglyceridemia 06/13/2010   Hypertrophic obstructive cardiomyopathy 05/06/2008   SP septal myomectomy at surgery 2005 Echo Nov 2018- Hyperdynamic LVEF with severe basal septal hypertrophy. There is chordal SAM with resting gradient of 16 mmHg that increases to 85 mmHg with Valsalva        Leg weakness, bilateral 01/18/2021   Long term (current) use of anticoagulants 05/22/2018   Lumbar stenosis with neurogenic claudication 02/04/2021   Malignant neoplasm of prostate 06/17/2009   Mechanical heart valve present    Manufacturer: [employer] Duty #: [id number]  Model #: [id number]. Card states MRI compatible with 3 teslas or less.   Obesity, unspecified 04/29/2008   OSA (obstructive sleep apnea) 07/12/2012   moderate-severe; uses CPAP nightly   PONV (postoperative nausea and vomiting)    after CABG- slow to wake up   Presence of prosthetic heart valve    Primary osteoarthritis of left shoulder 10/31/2018   S/P lumbar laminectomy 02/04/2021   Stage 3 chronic  kidney disease due to type 2 diabetes mellitus 11/11/2018   Tobacco use 06/16/2009   Type 2 diabetes mellitus with hyperglycemia    Ventral hernia 03/03/2014    Past Surgical History:  Procedure Laterality Date   AORTIC VALVE REPLACEMENT  11/14/2003   [facility]   APPENDECTOMY  1990   CHOLECYSTECTOMY  1990   CORONARY ANGIOGRAPHY  10/11/2021   CORONARY ARTERY BYPASS GRAFT  10/2003   HERNIA REPAIR  1999   right, inguinal   HERNIA REPAIR  2002   left,  inguinal   HIP SURGERY  2006   right hip   IR CT HEAD LTD  02/03/2022   IR PERCUTANEOUS ART THROMBECTOMY/INFUSION INTRACRANIAL INC DIAG ANGIO  01/24/2022   LUMBAR LAMINECTOMY/DECOMPRESSION MICRODISCECTOMY Bilateral 02/04/2021   Procedure: Bilateral Lumbar Two-Three Laminectomy;  Surgeon: [name], MD;  Location: [facility] OR;  Service: Neurosurgery;  Laterality: Bilateral;  3C/RM 20   PILONIDAL CYST EXCISION  1964   prostate seed implant  03/2010   RADIOLOGY WITH ANESTHESIA N/A 01/24/2022   Procedure: IR WITH ANESTHESIA;  Surgeon: Radiologist, Medication, MD;  Location: [facility] OR;  Service: Radiology;  Laterality: N/A;   TEE WITHOUT CARDIOVERSION N/A 03/14/2022   Procedure: TRANSESOPHAGEAL ECHOCARDIOGRAM (TEE);  Surgeon: Parke Poisson, MD;  Location[facility] ENDOSCOPY;  Service: Cardiology;  Laterality: N/A;   TONSILLECTOMY  childhood    Family History  Problem Relation Age of Onset   Heart disease Mother    Stroke Mother    Heart disease Father    Asperger's syndrome Son    Hyperlipidemia Son    Coronary artery disease Brother    Cancer Neg Hx        negative for colon cancer    Social History   Socioeconomic History   Marital status: Married    Spouse name: Not on file   Number of children: Not on file   Years of education: 18   Highest education level: Master's degree (e.g., MA, MS, MEng, MEd, MSW, MBA)  Occupational History   Occupation: Retired  Tobacco Use   Smoking status: Some Days    Types: Cigars    Last attempt to quit: 06/2021    Years since quitting: 1.5   Smokeless tobacco: Never   Tobacco comments:    Pt states he smokes 1 cigar about once a month. 06/01/2021    Pt quit  Cigars 06/2021  Substance and Sexual Activity   Alcohol use: Not Currently    Comment: wine maybe 1 per month   Drug use: No   Sexual activity: Never  Other Topics Concern   Not on file  Social History Narrative   ** Merged History Encounter ** Right Handed    Lives in a one sotyr home. One  step leads to front     Lives with wife   Retired   Occasionally caffeine   Social Drivers of Corporate investment banker Strain: Low Risk  (01/13/2023)   Overall Financial Resource Strain (CARDIA)    Difficulty of Paying Living Expenses: Not very hard  Food Insecurity: No Food Insecurity (01/13/2023)   Hunger Vital Sign    Worried About Running Out of Food in the Last Year: Never true    Ran Out of Food in the Last Year: Never true  Transportation Needs: No Transportation Needs (01/13/2023)   PRAPARE - Administrator, Civil Service (Medical): No    Lack of Transportation (Non-Medical): No  Physical Activity: Insufficiently Active (01/13/2023)  Exercise Vital Sign    Days of Exercise per Week: 3 days    Minutes of Exercise per Session: 30 min  Stress: No Stress Concern Present (01/13/2023)   [employer] of Occupational Health - Occupational Stress Questionnaire    Feeling of Stress : Not at all  Social Connections: Moderately Isolated (01/13/2023)   Social Connection and Isolation Panel [NHANES]    Frequency of Communication with Friends and Family: Once a week    Frequency of Social Gatherings with Friends and Family: Once a week    Attends Religious Services: More than 4 times per year    Active Member of [employer] or Organizations: No    Attends Banker Meetings: Never    Marital Status: Married  Catering manager Violence: Not At Risk (08/02/2022)   Humiliation, Afraid, Rape, and Kick questionnaire    Fear of Current or Ex-Partner: No    Emotionally Abused: No    Physically Abused: No    Sexually Abused: No    Outpatient Medications Prior to Visit  Medication Sig Dispense Refill   AMBULATORY NON FORMULARY MEDICATION cpap cushions            AirFit F20 (Size: Large)           AirFit F30 (Size: Med)   Please FAX the prescription to:  [phone] 12 each prn   aspirin EC 81 MG tablet Take 1 tablet (81 mg total) by mouth daily. Swallow  whole. 30 tablet 12   betamethasone dipropionate (DIPROLENE) 0.05 % cream Apply topically 2 (two) times daily as needed. To eczema rash (Patient taking differently: Apply 1 application  topically 2 (two) times daily as needed (to eczema/rash).) 30 g 1   Blood Glucose Monitoring Suppl Supplies MISC Use for monitoring glucose level 100 each 1   colchicine 0.6 MG tablet TAKE 1 TABLET (0.6 MG TOTAL) BY MOUTH 2 (TWO) TIMES DAILY AS NEEDED. 180 tablet 0   Continuous Blood Gluc Sensor (DEXCOM G6 SENSOR) MISC 1 Device by Does not apply route as directed. 9 each 3   dapagliflozin propanediol (FARXIGA) 10 MG TABS tablet Take 1 tablet (10 mg total) by mouth daily before breakfast. 30 tablet 0   fenofibrate (TRICOR) 145 MG tablet TAKE 1 TABLET BY MOUTH EVERY DAY 90 tablet 1   furosemide (LASIX) 40 MG tablet Take 1 tablet (40 mg total) by mouth daily. 90 tablet 3   glucose blood (ONETOUCH VERIO) test strip Use as instructed 100 strip 12   insulin aspart (NOVOLOG FLEXPEN) 100 UNIT/ML FlexPen Max daily 75 units (Patient taking differently: Inject 14-19 Units into the skin See admin instructions. Inject 16 units into the skin with breakfast, 14 units with lunch, and 16 units with supper/evening meal- may increase to up to 19 units per dose as needed for high blood sugar) 75 mL 3   Insulin Glargine (BASAGLAR KWIKPEN) 100 UNIT/ML Inject 22 Units into the skin daily. (Patient taking differently: Inject 22 Units into the skin at bedtime.) 30 mL 3   Insulin Pen Needle 32G X 4 MM MISC 1 Device by Does not apply route in the morning, at noon, in the evening, and at bedtime. 400 each 3   isosorbide mononitrate (IMDUR) 30 MG 24 hr tablet TAKE 1 TABLET BY MOUTH EVERY DAY 90 tablet 2   ketoconazole (NIZORAL) 2 % cream Apply 1 Application topically daily as needed for irritation.     metoprolol succinate (TOPROL-XL) 25 MG 24 hr tablet TAKE 1 TABLET  BY MOUTH EVERY DAY 90 tablet 1   OneTouch Delica Lancets 33G MISC USE AS DIRECTED  100 each 1   PRESCRIPTION MEDICATION CPAP- At bedtime     rosuvastatin (CRESTOR) 40 MG tablet TAKE 1 TABLET BY MOUTH EVERY DAY 90 tablet 3   Semaglutide, 2 MG/DOSE, 8 MG/3ML SOPN Inject 2 mg as directed once a week.     tacrolimus (PROTOPIC) 0.1 % ointment Apply 1 application  topically 2 (two) times daily as needed (for eczema).     warfarin (COUMADIN) 7.5 MG tablet TAKE 1/2 TABLET TO 1 TABLET BY MOUTH DAILY AS DIRECTED BY THE COUMADIN CLINIC 85 tablet 0   No facility-administered medications prior to visit.    No Known Allergies  Review of Systems  Constitutional:  Negative for weight loss.  HENT:  Positive for hearing loss. Negative for congestion.   Eyes:  Negative for blurred vision.  Respiratory:  Negative for cough and shortness of breath.   Cardiovascular:  Negative for chest pain.  Genitourinary:  Negative for dysuria and frequency.  Musculoskeletal:  Positive for joint pain.  Skin:  Negative for rash.  Neurological:  Negative for headaches.  Psychiatric/Behavioral:         Denies depression/anxiety    Objective:    Physical Exam Constitutional:      General: He is not in acute distress.    Appearance: He is well-developed.  HENT:     Head: Normocephalic and atraumatic.     Right Ear: Tympanic membrane and ear canal normal.     Left Ear: Tympanic membrane and ear canal normal.  Eyes:     Extraocular Movements: Extraocular movements intact.  Cardiovascular:     Rate and Rhythm: Normal rate and regular rhythm.     Heart sounds: No murmur heard. Pulmonary:     Effort: Pulmonary effort is normal. No respiratory distress.     Breath sounds: Normal breath sounds. No wheezing or rales.  Musculoskeletal:        General: Swelling (2+ bilateral LE edema) present.  Skin:    General: Skin is warm and dry.  Neurological:     Mental Status: He is alert and oriented to person, place, and time.  Psychiatric:        Behavior: Behavior normal.        Thought Content:  Thought content normal.      BP (!) 126/47 (BP Location: Right Arm, Patient Position: Sitting, Cuff Size: Large)   Pulse 73   Temp 98.8 F (37.1 C) (Oral)   Resp 16   Ht 5\' 9"  (1.753 m)   Wt 216 lb (98 kg)   SpO2 97%   BMI 31.90 kg/m  Wt Readings from Last 3 Encounters:  01/17/23 216 lb (98 kg)  01/02/23 220 lb (99.8 kg)  11/17/22 214 lb (97.1 kg)       Assessment & Plan:   Problem List Items Addressed This Visit       Unprioritized   Type 2 diabetes mellitus with stage 3a chronic kidney disease, with long-term current use of insulin (HCC)   Plan to repeat A1C after 1/18.        Relevant Orders   Urine Microalbumin w/creat. ratio   Preoperative evaluation to rule out surgical contraindication - Primary   Obtain studies as below. EKG tracing is personally reviewed.  EKG notes NSR.  TWI lead I and V1/V2- unchanged compared to previous EKG.          Relevant  Orders   Comp Met (CMET)   CBC w/Diff   Urine Culture   DG Chest 2 View   EKG 12-Lead (Completed)   Chronic anticoagulation   Patient is maintained on coumadin per coumadin clinic.  He will need lovenox bridging per coumadin clinic.  He will need cardiac clearance due to his hix of CAD/Valve disease.  He has appointment for clearance scheduled with cardiology.        I am having Aditya Szalkowski. Swisher maintain his Blood Glucose Monitoring Suppl, betamethasone dipropionate, AMBULATORY NON FORMULARY MEDICATION, Dexcom G6 Sensor, OneTouch Verio, OneTouch Delica Lancets 33G, tacrolimus, colchicine, Basaglar KwikPen, NovoLOG FlexPen, Insulin Pen Needle, PRESCRIPTION MEDICATION, ketoconazole, aspirin EC, isosorbide mononitrate, furosemide, dapagliflozin propanediol, rosuvastatin, Semaglutide (2 MG/DOSE), fenofibrate, metoprolol succinate, and warfarin.  No orders of the defined types were placed in this encounter.

## 2023-01-17 NOTE — Assessment & Plan Note (Signed)
Plan to repeat A1C after 1/18.

## 2023-01-17 NOTE — Assessment & Plan Note (Signed)
Patient is maintained on coumadin per coumadin clinic.  He will need lovenox bridging per coumadin clinic.  He will need cardiac clearance due to his hix of CAD/Valve disease.  He has appointment for clearance scheduled with cardiology.

## 2023-01-17 NOTE — Assessment & Plan Note (Signed)
Obtain studies as below. EKG tracing is personally reviewed.  EKG notes NSR.  TWI lead I and V1/V2- unchanged compared to previous EKG.

## 2023-01-18 ENCOUNTER — Encounter: Payer: Self-pay | Admitting: Family

## 2023-01-18 LAB — CBC WITH DIFFERENTIAL/PLATELET
Basophils Absolute: 0.1 10*3/uL (ref 0.0–0.1)
Basophils Relative: 0.9 % (ref 0.0–3.0)
Eosinophils Absolute: 0.1 10*3/uL (ref 0.0–0.7)
Eosinophils Relative: 1.1 % (ref 0.0–5.0)
HCT: 42.5 % (ref 39.0–52.0)
Hemoglobin: 14.2 g/dL (ref 13.0–17.0)
Lymphocytes Relative: 15.9 % (ref 12.0–46.0)
Lymphs Abs: 1.5 10*3/uL (ref 0.7–4.0)
MCHC: 33.4 g/dL (ref 30.0–36.0)
MCV: 91.1 fL (ref 78.0–100.0)
Monocytes Absolute: 0.7 10*3/uL (ref 0.1–1.0)
Monocytes Relative: 8.2 % (ref 3.0–12.0)
Neutro Abs: 6.8 10*3/uL (ref 1.4–7.7)
Neutrophils Relative %: 73.9 % (ref 43.0–77.0)
Platelets: 245 10*3/uL (ref 150.0–400.0)
RBC: 4.66 Mil/uL (ref 4.22–5.81)
RDW: 14.8 % (ref 11.5–15.5)
WBC: 9.2 10*3/uL (ref 4.0–10.5)

## 2023-01-18 LAB — COMPREHENSIVE METABOLIC PANEL
ALT: 19 U/L (ref 0–53)
AST: 29 U/L (ref 0–37)
Albumin: 4.1 g/dL (ref 3.5–5.2)
Alkaline Phosphatase: 47 U/L (ref 39–117)
BUN: 36 mg/dL — ABNORMAL HIGH (ref 6–23)
CO2: 31 meq/L (ref 19–32)
Calcium: 9 mg/dL (ref 8.4–10.5)
Chloride: 99 meq/L (ref 96–112)
Creatinine, Ser: 1.88 mg/dL — ABNORMAL HIGH (ref 0.40–1.50)
GFR: 33.96 mL/min — ABNORMAL LOW (ref >60.00)
Glucose, Bld: 146 mg/dL — ABNORMAL HIGH (ref 70–99)
Potassium: 3.5 meq/L (ref 3.5–5.1)
Sodium: 141 meq/L (ref 135–145)
Total Bilirubin: 0.5 mg/dL (ref 0.2–1.2)
Total Protein: 7.1 g/dL (ref 6.0–8.3)

## 2023-01-18 LAB — URINE CULTURE
MICRO NUMBER:: 15866512
SPECIMEN QUALITY:: ADEQUATE

## 2023-01-18 LAB — MICROALBUMIN / CREATININE URINE RATIO
Creatinine,U: 61.9 mg/dL
Microalb Creat Ratio: 1.1 mg/g (ref 0.0–30.0)
Microalb, Ur: <0.7 mg/dL (ref 0.0–1.9)

## 2023-01-19 ENCOUNTER — Telehealth: Payer: Self-pay | Admitting: Family

## 2023-01-19 NOTE — Telephone Encounter (Signed)
Results given to patient's wife and she confirmed he sees nephrology.  Results faxed over to  [facility].

## 2023-01-19 NOTE — Telephone Encounter (Signed)
Kidney function has decreased slightly from last check. Can you please confirm with patient that he is seeing a nephrologist?  If so, let's request last office visit.  If he is not, please place referral to [facility] dx Chronic Kidney disease.

## 2023-01-25 ENCOUNTER — Ambulatory Visit: Payer: Medicare HMO | Attending: Cardiology

## 2023-01-25 DIAGNOSIS — Z7901 Long term (current) use of anticoagulants: Secondary | ICD-10-CM | POA: Diagnosis not present

## 2023-01-25 DIAGNOSIS — Z952 Presence of prosthetic heart valve: Secondary | ICD-10-CM

## 2023-01-25 LAB — POCT INR: INR: 3.6 — AB (ref 2.0–3.0)

## 2023-01-25 NOTE — Patient Instructions (Signed)
Description   Eat a serving of greens today and continue taking warfarin 1 tablet daily except 1/2 tablet on Mondays, Wednesdays, and Fridays.  Remain consistent with leafy green (increase intake to 4 servings/week).  Recheck INR in 4 weeks.  Coumadin Clinic [phone].

## 2023-02-12 NOTE — Progress Notes (Signed)
 Virtual Visit via Telephone Note   Because of [name]'s co-morbid illnesses, he is at least at moderate risk for complications without adequate follow up.  This format is felt to be most appropriate for this patient at this time.  The patient did not have access to video technology/had technical difficulties with video requiring transitioning to audio format only (telephone).  All issues noted in this document were discussed and addressed.  No physical exam could be performed with this format.  Please refer to the patient's chart for his consent to telehealth for [facility].  Evaluation Performed:  Preoperative cardiovascular risk assessment _____________   Date:  02/13/2023   Patient ID:  [name], DOB 07/18/1945, MRN [id number] Patient Location:  Home Provider location:   Office  Primary Care Provider:  Daryl Setter, NP Primary Cardiologist:  Dr. Pietro   Chief Complaint / Patient Profile   78 y.o. y/o male with a h/o CAD coronary artery bypassing graft (LIMA to the LAD, saphenous vein graft to the marginal, saphenous vein graft sequentially to the right coronary artery and distal circumflex) as well as aortic valve replacement (St. Jude aortic valve) in 2005, PAF, recurrent CVA most recent in 2023 Goal INR was increased to 3-3.5. , AoV replacement in 2005 with CABG ,AAA (will need SBE prophylaxis for dental procedures).    He is pending left reverse total shoulder replacement on date to be determined by Dr. Josefina, and presents today for telephonic preoperative cardiovascular risk assessment.   History of Present Illness    [name] is a 78 y.o. male who presents via audio/video conferencing for a telehealth visit today.  Pt was last seen in cardiology clinic on 12/02/2022 by Dr.Crenshaw.  At that time [name] was doing well .  The patient is now pending procedure as outlined above. Since his last visit, he has done well, denies any  recurrent cardiac issues.  He is very hard of hearing.  His wife has answered a good bit of the questions concerning this screening.  He does go to the gym 3 days a week.  He uses a cane for ambulation.  He is medically compliant.  Past Medical History    Past Medical History:  Diagnosis Date   Actinic keratosis 02/14/2011   Allergy    Aneurysm of ascending aorta without rupture (HCC) 01/06/2022   Aortic stenosis    Arthritis    Bilateral sensorineural hearing loss 06/16/2009   CAD (coronary artery disease)    cabg   Complication of anesthesia    Coronary atherosclerosis of native coronary artery 06/15/2009   Diabetic polyneuropathy 11/11/2018   Dyslipidemia 11/05/2018   Embolic stroke involving left middle cerebral artery  05/30/2021   Erectile dysfunction 05/18/2009   Essential hypertension 06/16/2009   Expressive aphasia 06/25/2021   External hemorrhoids 03/21/2010   Gout 04/09/2015   Heart murmur    Hemochromatosis 02/14/2011   History of aortic valve replacement 10/13/2009   History of CABG 11/05/2018   2005- 1. Coronary artery bypass grafting x4 with LIMA-LAD, SVG-OM1, SVG-AM and dCFX.  2. Aortic valve replacement with St Jude AVR 3. Septal myomectomy.  4. Myoview low risk 2016   History of hepatitis 01/31/1972   Hypertriglyceridemia 06/13/2010   Hypertrophic obstructive cardiomyopathy 05/06/2008   SP septal myomectomy at surgery 2005 Echo Nov 2018- Hyperdynamic LVEF with severe basal septal hypertrophy. There is chordal SAM with resting gradient of 16 mmHg that increases to 85 mmHg  with Valsalva        Leg weakness, bilateral 01/18/2021   Long term (current) use of anticoagulants 05/22/2018   Lumbar stenosis with neurogenic claudication 02/04/2021   Malignant neoplasm of prostate 06/17/2009   Mechanical heart valve present    Manufacturer: Deitra Rosemary Blade #: [id number]  Model #: [id number]. Card states MRI compatible with 3 teslas or less.   Obesity, unspecified  04/29/2008   OSA (obstructive sleep apnea) 07/12/2012   moderate-severe; uses CPAP nightly   PONV (postoperative nausea and vomiting)    after CABG- slow to wake up   Presence of prosthetic heart valve    Primary osteoarthritis of left shoulder 10/31/2018   S/P lumbar laminectomy 02/04/2021   Stage 3 chronic kidney disease due to type 2 diabetes mellitus 11/11/2018   Tobacco use 06/16/2009   Type 2 diabetes mellitus with hyperglycemia    Ventral hernia 03/03/2014   Past Surgical History:  Procedure Laterality Date   AORTIC VALVE REPLACEMENT  11/14/2003   St Jude [facility]   APPENDECTOMY  1990   CHOLECYSTECTOMY  1990   CORONARY ANGIOGRAPHY  10/11/2021   CORONARY ARTERY BYPASS GRAFT  10/2003   HERNIA REPAIR  1999   right, inguinal   HERNIA REPAIR  2002   left, inguinal   HIP SURGERY  2006   right hip   IR CT HEAD LTD  02/03/2022   IR PERCUTANEOUS ART THROMBECTOMY/INFUSION INTRACRANIAL INC DIAG ANGIO  01/24/2022   LUMBAR LAMINECTOMY/DECOMPRESSION MICRODISCECTOMY Bilateral 02/04/2021   Procedure: Bilateral Lumbar Two-Three Laminectomy;  Surgeon: Colon Shove, MD;  Location: [facility] OR;  Service: Neurosurgery;  Laterality: Bilateral;  3C/RM 20   PILONIDAL CYST EXCISION  1964   prostate seed implant  03/2010   RADIOLOGY WITH ANESTHESIA N/A 01/24/2022   Procedure: IR WITH ANESTHESIA;  Surgeon: Radiologist, Medication, MD;  Location: [facility] OR;  Service: Radiology;  Laterality: N/A;   TEE WITHOUT CARDIOVERSION N/A 03/14/2022   Procedure: TRANSESOPHAGEAL ECHOCARDIOGRAM (TEE);  Surgeon: Loni Soyla LABOR, MD;  Location: [facility] ENDOSCOPY;  Service: Cardiology;  Laterality: N/A;   TONSILLECTOMY  childhood    Allergies  No Known Allergies  Home Medications    Prior to Admission medications   Medication Sig Start Date End Date Taking? Authorizing Provider  AMBULATORY NON FORMULARY MEDICATION cpap cushions            AirFit F20 (Size: Large)           AirFit F30 (Size: Med)   Please FAX the  prescription to:  [phone] 02/10/20   Daryl Setter, NP  aspirin  EC 81 MG tablet Take 1 tablet (81 mg total) by mouth daily. Swallow whole. 04/14/22   Ghimire, Donalda HERO, MD  betamethasone  dipropionate (DIPROLENE ) 0.05 % cream Apply topically 2 (two) times daily as needed. To eczema rash Patient taking differently: Apply 1 application  topically 2 (two) times daily as needed (to eczema/rash). 09/06/18   Daryl Setter, NP  Blood Glucose Monitoring Suppl Supplies MISC Use for monitoring glucose level 08/05/18   Daryl Setter, NP  colchicine  0.6 MG tablet TAKE 1 TABLET (0.6 MG TOTAL) BY MOUTH 2 (TWO) TIMES DAILY AS NEEDED. 11/10/21   Daryl Setter, NP  Continuous Blood Gluc Sensor (DEXCOM G6 SENSOR) MISC 1 Device by Does not apply route as directed. 05/18/20   Shamleffer, Ibtehal Jaralla, MD  dapagliflozin  propanediol (FARXIGA ) 10 MG TABS tablet Take 1 tablet (10 mg total) by mouth daily before breakfast. 09/11/22   Shamleffer, Ibtehal Jaralla, MD  fenofibrate  (TRICOR ) 145 MG tablet TAKE 1 TABLET BY MOUTH EVERY DAY 01/08/23   O'Sullivan, [name], NP  furosemide  (LASIX ) 40 MG tablet Take 1 tablet (40 mg total) by mouth daily. 09/05/22 01/12/23  Pietro Redell RAMAN, MD  glucose blood (ONETOUCH VERIO) test strip Use as instructed 09/15/20   Daryl Setter, NP  insulin  aspart (NOVOLOG  FLEXPEN) 100 UNIT/ML FlexPen Max daily 75 units Patient taking differently: Inject 14-19 Units into the skin See admin instructions. Inject 16 units into the skin with breakfast, 14 units with lunch, and 16 units with supper/evening meal- may increase to up to 19 units per dose as needed for high blood sugar 02/13/22   Shamleffer, Ibtehal Jaralla, MD  Insulin  Glargine (BASAGLAR  KWIKPEN) 100 UNIT/ML Inject 22 Units into the skin daily. Patient taking differently: Inject 22 Units into the skin at bedtime. 02/13/22   Shamleffer, Ibtehal Jaralla, MD  Insulin  Pen Needle 32G X 4 MM MISC 1 Device by Does not apply  route in the morning, at noon, in the evening, and at bedtime. 02/13/22   Shamleffer, Ibtehal Jaralla, MD  isosorbide  mononitrate (IMDUR ) 30 MG 24 hr tablet TAKE 1 TABLET BY MOUTH EVERY DAY 06/02/22   Pietro Redell RAMAN, MD  ketoconazole (NIZORAL) 2 % cream Apply 1 Application topically daily as needed for irritation.    [provider]  metoprolol  succinate (TOPROL -XL) 25 MG 24 hr tablet TAKE 1 TABLET BY MOUTH EVERY DAY 01/17/23   Daryl Setter, NP  OneTouch Delica Lancets 33G MISC USE AS DIRECTED 09/15/20   Daryl Setter, NP  PRESCRIPTION MEDICATION CPAP- At bedtime    [provider]  rosuvastatin  (CRESTOR ) 40 MG tablet TAKE 1 TABLET BY MOUTH EVERY DAY 09/12/22   Pietro Redell RAMAN, MD  Semaglutide , 2 MG/DOSE, 8 MG/3ML SOPN Inject 2 mg as directed once a week. 01/02/23   Shamleffer, Ibtehal Jaralla, MD  tacrolimus  (PROTOPIC ) 0.1 % ointment Apply 1 application  topically 2 (two) times daily as needed (for eczema). 11/29/20   [provider]  warfarin (COUMADIN ) 7.5 MG tablet TAKE 1/2 TABLET TO 1 TABLET BY MOUTH DAILY AS DIRECTED BY THE COUMADIN  CLINIC 01/17/23   Pietro Redell RAMAN, MD    Physical Exam    Vital Signs:  [name] does not have vital signs available for review today.  Given telephonic nature of communication, physical exam is limited. AAOx3. NAD. Normal affect.  Speech and respirations are unlabored.  Accessory Clinical Findings    None  Assessment & Plan    1.  Preoperative Cardiovascular Risk Assessment: According to the Revised Cardiac Risk Index (RCRI), his Perioperative Risk of Major Cardiac Event is (%): 11  HIGH RISK  His Functional Capacity in METs is: 7.01according to the Duke Activity Status Index (DASI).   Per office protocol, patient can hold warfarin for 5 days prior to procedure.   Patient WILL need bridging with Lovenox  (enoxaparin ) around procedure.    He will need CBC prior to bridging. Will place orders. Bridge  coordinated by Coumadin  clinic. Surgeon must give a date so that this can be completed.  The patient was advised that if he develops new symptoms prior to surgery to contact our office to arrange for a follow-up visit, and he verbalized understanding.  Therefore, based on ACC/AHA guidelines, patient would be at acceptable risk for the planned procedure without further cardiovascular testing. I will route this recommendation to the requesting party via Epic fax function.     A copy of this  note will be routed to requesting surgeon.   Time:   Today, I have spent 15 minutes with the patient with telehealth technology discussing medical history, symptoms, and management plan.     David Satterfield, NP  02/13/2023, 9:02 AM

## 2023-02-13 ENCOUNTER — Ambulatory Visit: Payer: Medicare HMO | Attending: Cardiology

## 2023-02-13 ENCOUNTER — Other Ambulatory Visit: Payer: Self-pay

## 2023-02-13 DIAGNOSIS — Z01818 Encounter for other preprocedural examination: Secondary | ICD-10-CM

## 2023-02-13 DIAGNOSIS — Z01810 Encounter for preprocedural cardiovascular examination: Secondary | ICD-10-CM

## 2023-02-13 DIAGNOSIS — E1122 Type 2 diabetes mellitus with diabetic chronic kidney disease: Secondary | ICD-10-CM

## 2023-02-16 DIAGNOSIS — E119 Type 2 diabetes mellitus without complications: Secondary | ICD-10-CM | POA: Diagnosis not present

## 2023-02-17 ENCOUNTER — Telehealth: Payer: Self-pay | Admitting: Family

## 2023-02-17 DIAGNOSIS — E1122 Type 2 diabetes mellitus with diabetic chronic kidney disease: Secondary | ICD-10-CM

## 2023-02-17 NOTE — Telephone Encounter (Signed)
I had made myself a note to recheck his A1C around now.  Is he still planning shoulder surgery?

## 2023-02-19 NOTE — Telephone Encounter (Signed)
Patient is scheduled to come in tomorrow for his labs

## 2023-02-20 ENCOUNTER — Other Ambulatory Visit (INDEPENDENT_AMBULATORY_CARE_PROVIDER_SITE_OTHER): Payer: Medicare HMO

## 2023-02-20 ENCOUNTER — Encounter: Payer: Self-pay | Admitting: Internal Medicine

## 2023-02-20 DIAGNOSIS — E1122 Type 2 diabetes mellitus with diabetic chronic kidney disease: Secondary | ICD-10-CM

## 2023-02-20 DIAGNOSIS — Z794 Long term (current) use of insulin: Secondary | ICD-10-CM | POA: Diagnosis not present

## 2023-02-20 DIAGNOSIS — N1831 Chronic kidney disease, stage 3a: Secondary | ICD-10-CM

## 2023-02-20 LAB — HEMOGLOBIN A1C: Hgb A1c MFr Bld: 7.9 % — ABNORMAL HIGH (ref 4.6–6.5)

## 2023-02-21 ENCOUNTER — Ambulatory Visit (INDEPENDENT_AMBULATORY_CARE_PROVIDER_SITE_OTHER): Payer: Medicare HMO | Admitting: Family

## 2023-02-21 ENCOUNTER — Telehealth: Payer: Self-pay | Admitting: Family

## 2023-02-21 VITALS — BP 137/57 | HR 77 | Temp 97.9°F | Resp 16 | Ht 69.0 in | Wt 219.0 lb

## 2023-02-21 DIAGNOSIS — I10 Essential (primary) hypertension: Secondary | ICD-10-CM

## 2023-02-21 DIAGNOSIS — E1122 Type 2 diabetes mellitus with diabetic chronic kidney disease: Secondary | ICD-10-CM | POA: Diagnosis not present

## 2023-02-21 DIAGNOSIS — Z794 Long term (current) use of insulin: Secondary | ICD-10-CM | POA: Diagnosis not present

## 2023-02-21 DIAGNOSIS — R339 Retention of urine, unspecified: Secondary | ICD-10-CM | POA: Diagnosis not present

## 2023-02-21 DIAGNOSIS — E785 Hyperlipidemia, unspecified: Secondary | ICD-10-CM

## 2023-02-21 DIAGNOSIS — I480 Paroxysmal atrial fibrillation: Secondary | ICD-10-CM

## 2023-02-21 DIAGNOSIS — N1831 Chronic kidney disease, stage 3a: Secondary | ICD-10-CM | POA: Diagnosis not present

## 2023-02-21 DIAGNOSIS — N183 Chronic kidney disease, stage 3 unspecified: Secondary | ICD-10-CM

## 2023-02-21 DIAGNOSIS — E6609 Other obesity due to excess calories: Secondary | ICD-10-CM

## 2023-02-21 DIAGNOSIS — Z6832 Body mass index (BMI) 32.0-32.9, adult: Secondary | ICD-10-CM

## 2023-02-21 DIAGNOSIS — N1832 Chronic kidney disease, stage 3b: Secondary | ICD-10-CM | POA: Insufficient documentation

## 2023-02-21 DIAGNOSIS — G4733 Obstructive sleep apnea (adult) (pediatric): Secondary | ICD-10-CM

## 2023-02-21 DIAGNOSIS — E66811 Obesity, class 1: Secondary | ICD-10-CM | POA: Diagnosis not present

## 2023-02-21 LAB — BASIC METABOLIC PANEL
BUN: 33 mg/dL — ABNORMAL HIGH (ref 6–23)
CO2: 30 meq/L (ref 19–32)
Calcium: 9.4 mg/dL (ref 8.4–10.5)
Chloride: 101 meq/L (ref 96–112)
Creatinine, Ser: 1.53 mg/dL — ABNORMAL HIGH (ref 0.40–1.50)
GFR: 43.46 mL/min — ABNORMAL LOW (ref >60.00)
Glucose, Bld: 136 mg/dL — ABNORMAL HIGH (ref 70–99)
Potassium: 4.4 meq/L (ref 3.5–5.1)
Sodium: 138 meq/L (ref 135–145)

## 2023-02-21 NOTE — Telephone Encounter (Signed)
Electronic request sent 

## 2023-02-21 NOTE — Patient Instructions (Signed)
VISIT SUMMARY:  During today's visit, we discussed your diabetes management, hearing aid issues, shoulder pain, and general health maintenance. We reviewed your recent blood sugar levels and A1c, and talked about the importance of diet in managing diabetes. We also addressed your hearing aid malfunction, shoulder pain, and hyperlipidemia. Additionally, we discussed some general health maintenance tasks.  YOUR PLAN:  -TYPE 2 DIABETES MELLITUS: Type 2 Diabetes Mellitus is a condition where your body does not use insulin properly, leading to high blood sugar levels. Your A1c has increased slightly, likely due to dietary choices during the holiday season. Continue taking Ozempic 2mg  weekly once you receive it; in the meantime, take two 1mg  injections weekly. Your endocrinologist will review your A1c and adjust your management plan as needed. You are also scheduled to see a nutritionist in February to help with dietary planning.  -HEARING LOSS: Hearing loss can occur due to various reasons, and in your case, your hearing aids have stopped working. You are currently using a friend's hearing aids until you can get a replacement. Please ensure you obtain new hearing aids as soon as possible.  -SHOULDER PAIN: You have chronic shoulder pain that occasionally becomes intense. You are managing the pain with current strategies, and surgery is planned but dependent on better A1c control. Continue with your current pain management strategies.  -HYPERLIPIDEMIA: Hyperlipidemia is a condition where you have high levels of fats (lipids) in your blood. Despite taking fenofibrate and Crestor, your triglycerides remain elevated. Continue with your current medications, and plan to discuss this further with a nutritionist in February.  -GENERAL HEALTH MAINTENANCE: For your general health, consider getting the shingles vaccine at the pharmacy. Show the dermatologist the new black spot on your nose during your February  appointment. Repeat your kidney function test today. Follow up in three months for a routine check-up.  INSTRUCTIONS:  Please follow up with your endocrinologist to review your A1c and adjust your diabetes management plan as needed. Ensure you see a nutritionist in February to help with dietary planning. Obtain new hearing aids as soon as possible. Continue with your current pain management strategies for your shoulder pain, and remember that surgery is dependent on better A1c control. Discuss your elevated triglycerides with the nutritionist in February. Consider getting the shingles vaccine at the pharmacy, show the dermatologist the new black spot on your nose during your February appointment, and repeat your kidney function test today. Follow up in three months for a routine check-up.

## 2023-02-21 NOTE — Assessment & Plan Note (Signed)
Will request report from Nephrology.  Consider arb. Had tremor with ACE. Urine microalbumin is negative.

## 2023-02-21 NOTE — Assessment & Plan Note (Signed)
Continues coumadin per coumadin clinic.

## 2023-02-21 NOTE — Assessment & Plan Note (Signed)
Lab Results  Component Value Date   CHOL 140 11/13/2022   HDL 27 (L) 11/13/2022   LDLCALC 70 11/13/2022   LDLDIRECT 53 01/26/2022   TRIG 263 (H) 11/13/2022   CHOLHDL 5.2 (H) 11/13/2022   Continue fenofibrate/crestor, dietary modification.

## 2023-02-21 NOTE — Progress Notes (Signed)
Subjective:     Patient ID: [name], male    DOB: 04/23/1945, 78 y.o.   MRN: [id number]  Chief Complaint  Patient presents with   Diabetes    Here for follow up, discuss lab results     Diabetes    Discussed the use of AI scribe software for clinical note transcription with the patient, who gave verbal consent to proceed.  History of Present Illness   The patient, with a history of diabetes, presents for a follow-up visit. He reports that his blood sugar levels have been between 103 and 160 for the past seven weeks, with occasional spikes due to dietary indiscretions, such as consuming soda and [place] fries. He acknowledges that his recent A1c has increased slightly, which he attributes to the holiday season. He is currently on Ozempic, which was recently increased to 2mg  by his endocrinologist.  In addition, the patient reports issues with his hearing aids, which recently stopped working. He is currently using a friend's hearing aids until he can get a replacement.  The patient also mentions a shoulder issue, which causes occasional sharp pain. He is able to manage the pain but notes that it can be intense at times.  Lastly, the patient mentions that he is wearing a CPAP machine for sleep apnea and is taking metoprolol for blood pressure and furosemide for edema in the feet. He reports that his feet have improved and are no longer as swollen as before.        Lab Results  Component Value Date   HGBA1C 7.9 (H) 02/20/2023   HGBA1C 7.8 (H) 11/17/2022   HGBA1C 7.1 (A) 06/29/2022   Lab Results  Component Value Date   MICROALBUR <0.7 01/17/2023   LDLCALC 70 11/13/2022   CREATININE 1.88 (H) 01/17/2023     Health Maintenance Due  Topic Date Due   Zoster Vaccines- Shingrix (1 of 2) 03/22/1964   DTaP/Tdap/Td (3 - Td or Tdap) 03/01/2022   COVID-19 Vaccine (5 - 2024-25 season) 10/01/2022    Past Medical History:  Diagnosis Date   Actinic keratosis 02/14/2011    Allergy    Aneurysm of ascending aorta without rupture (HCC) 01/06/2022   Aortic stenosis    Arthritis    Bilateral sensorineural hearing loss 06/16/2009   CAD (coronary artery disease)    cabg   Complication of anesthesia    Coronary atherosclerosis of native coronary artery 06/15/2009   Diabetic polyneuropathy 11/11/2018   Dyslipidemia 11/05/2018   Embolic stroke involving left middle cerebral artery  05/30/2021   Erectile dysfunction 05/18/2009   Essential hypertension 06/16/2009   Expressive aphasia 06/25/2021   External hemorrhoids 03/21/2010   Gout 04/09/2015   Heart murmur    Hemochromatosis 02/14/2011   History of aortic valve replacement 10/13/2009   History of CABG 11/05/2018   2005- 1. Coronary artery bypass grafting x4 with LIMA-LAD, SVG-OM1, SVG-AM and dCFX.  2. Aortic valve replacement with [employer] Jude AVR 3. Septal myomectomy.  4. Myoview low risk 2016   History of hepatitis 01/31/1972   Hypertriglyceridemia 06/13/2010   Hypertrophic obstructive cardiomyopathy 05/06/2008   SP septal myomectomy at surgery 2005 Echo Nov 2018- Hyperdynamic LVEF with severe basal septal hypertrophy. There is chordal SAM with resting gradient of 16 mmHg that increases to 85 mmHg with Valsalva        Leg weakness, bilateral 01/18/2021   Long term (current) use of anticoagulants 05/22/2018   Lumbar stenosis with neurogenic claudication 02/04/2021   Malignant neoplasm  of prostate 06/17/2009   Mechanical heart valve present    Manufacturer: Theresa Duty #: [id number]  Model #: [id number]. Card states MRI compatible with 3 teslas or less.   Obesity, unspecified 04/29/2008   OSA (obstructive sleep apnea) 07/12/2012   moderate-severe; uses CPAP nightly   PONV (postoperative nausea and vomiting)    after CABG- slow to wake up   Presence of prosthetic heart valve    Primary osteoarthritis of left shoulder 10/31/2018   S/P lumbar laminectomy 02/04/2021   Stage 3 chronic kidney disease due to  type 2 diabetes mellitus 11/11/2018   Tobacco use 06/16/2009   Type 2 diabetes mellitus with hyperglycemia    Ventral hernia 03/03/2014    Past Surgical History:  Procedure Laterality Date   AORTIC VALVE REPLACEMENT  11/14/2003   [facility]   APPENDECTOMY  1990   CHOLECYSTECTOMY  1990   CORONARY ANGIOGRAPHY  10/11/2021   CORONARY ARTERY BYPASS GRAFT  10/2003   HERNIA REPAIR  1999   right, inguinal   HERNIA REPAIR  2002   left, inguinal   HIP SURGERY  2006   right hip   IR CT HEAD LTD  02/03/2022   IR PERCUTANEOUS ART THROMBECTOMY/INFUSION INTRACRANIAL INC DIAG ANGIO  01/24/2022   LUMBAR LAMINECTOMY/DECOMPRESSION MICRODISCECTOMY Bilateral 02/04/2021   Procedure: Bilateral Lumbar Two-Three Laminectomy;  Surgeon: [name], MD;  Location: [facility] OR;  Service: Neurosurgery;  Laterality: Bilateral;  3C/RM 20   PILONIDAL CYST EXCISION  1964   prostate seed implant  03/2010   RADIOLOGY WITH ANESTHESIA N/A 01/24/2022   Procedure: IR WITH ANESTHESIA;  Surgeon: Radiologist, Medication, MD;  Location: [facility] OR;  Service: Radiology;  Laterality: N/A;   TEE WITHOUT CARDIOVERSION N/A 03/14/2022   Procedure: TRANSESOPHAGEAL ECHOCARDIOGRAM (TEE);  Surgeon: Parke Poisson, MD;  Location: [facility] ENDOSCOPY;  Service: Cardiology;  Laterality: N/A;   TONSILLECTOMY  childhood    Family History  Problem Relation Age of Onset   Heart disease Mother    Stroke Mother    Heart disease Father    Asperger's syndrome Son    Hyperlipidemia Son    Coronary artery disease Brother    Cancer Neg Hx        negative for colon cancer    Social History   Socioeconomic History   Marital status: Married    Spouse name: Not on file   Number of children: Not on file   Years of education: 18   Highest education level: Master's degree (e.g., MA, MS, MEng, MEd, MSW, MBA)  Occupational History   Occupation: Retired  Tobacco Use   Smoking status: Some Days    Types: Cigars    Last attempt to quit: 06/2021     Years since quitting: 1.6   Smokeless tobacco: Never   Tobacco comments:    Pt states he smokes 1 cigar about once a month. 06/01/2021    Pt quit  Cigars 06/2021  Substance and Sexual Activity   Alcohol use: Not Currently    Comment: wine maybe 1 per month   Drug use: No   Sexual activity: Never  Other Topics Concern   Not on file  Social History Narrative   ** Merged History Encounter ** Right Handed    Lives in a one sotyr home. One step leads to front     Lives with wife   Retired   Occasionally caffeine   Social Drivers of Corporate investment banker Strain: Low Risk  (  01/13/2023)   Overall Financial Resource Strain (CARDIA)    Difficulty of Paying Living Expenses: Not very hard  Food Insecurity: No Food Insecurity (01/13/2023)   Hunger Vital Sign    Worried About Running Out of Food in the Last Year: Never true    Ran Out of Food in the Last Year: Never true  Transportation Needs: No Transportation Needs (01/13/2023)   PRAPARE - Administrator, Civil Service (Medical): No    Lack of Transportation (Non-Medical): No  Physical Activity: Insufficiently Active (01/13/2023)   Exercise Vital Sign    Days of Exercise per Week: 3 days    Minutes of Exercise per Session: 30 min  Stress: No Stress Concern Present (01/13/2023)   [employer] of Occupational Health - Occupational Stress Questionnaire    Feeling of Stress : Not at all  Social Connections: Moderately Isolated (01/13/2023)   Social Connection and Isolation Panel [NHANES]    Frequency of Communication with Friends and Family: Once a week    Frequency of Social Gatherings with Friends and Family: Once a week    Attends Religious Services: More than 4 times per year    Active Member of [employer] or Organizations: No    Attends Banker Meetings: Never    Marital Status: Married  Catering manager Violence: Not At Risk (08/02/2022)   Humiliation, Afraid, Rape, and Kick questionnaire    Fear of  Current or Ex-Partner: No    Emotionally Abused: No    Physically Abused: No    Sexually Abused: No    Outpatient Medications Prior to Visit  Medication Sig Dispense Refill   AMBULATORY NON FORMULARY MEDICATION cpap cushions            AirFit F20 (Size: Large)           AirFit F30 (Size: Med)   Please FAX the prescription to:  [phone] 12 each prn   aspirin EC 81 MG tablet Take 1 tablet (81 mg total) by mouth daily. Swallow whole. 30 tablet 12   betamethasone dipropionate (DIPROLENE) 0.05 % cream Apply topically 2 (two) times daily as needed. To eczema rash (Patient taking differently: Apply 1 application  topically 2 (two) times daily as needed (to eczema/rash).) 30 g 1   Blood Glucose Monitoring Suppl Supplies MISC Use for monitoring glucose level 100 each 1   colchicine 0.6 MG tablet TAKE 1 TABLET (0.6 MG TOTAL) BY MOUTH 2 (TWO) TIMES DAILY AS NEEDED. 180 tablet 0   Continuous Blood Gluc Sensor (DEXCOM G6 SENSOR) MISC 1 Device by Does not apply route as directed. 9 each 3   dapagliflozin propanediol (FARXIGA) 10 MG TABS tablet Take 1 tablet (10 mg total) by mouth daily before breakfast. 30 tablet 0   fenofibrate (TRICOR) 145 MG tablet TAKE 1 TABLET BY MOUTH EVERY DAY 90 tablet 1   glucose blood (ONETOUCH VERIO) test strip Use as instructed 100 strip 12   insulin aspart (NOVOLOG FLEXPEN) 100 UNIT/ML FlexPen Max daily 75 units (Patient taking differently: Inject 14-19 Units into the skin See admin instructions. Inject 16 units into the skin with breakfast, 14 units with lunch, and 16 units with supper/evening meal- may increase to up to 19 units per dose as needed for high blood sugar) 75 mL 3   Insulin Glargine (BASAGLAR KWIKPEN) 100 UNIT/ML Inject 22 Units into the skin daily. (Patient taking differently: Inject 22 Units into the skin at bedtime.) 30 mL 3   Insulin Pen Needle 32G  X 4 MM MISC 1 Device by Does not apply route in the morning, at noon, in the evening, and at bedtime. 400  each 3   isosorbide mononitrate (IMDUR) 30 MG 24 hr tablet TAKE 1 TABLET BY MOUTH EVERY DAY 90 tablet 2   ketoconazole (NIZORAL) 2 % cream Apply 1 Application topically daily as needed for irritation.     metoprolol succinate (TOPROL-XL) 25 MG 24 hr tablet TAKE 1 TABLET BY MOUTH EVERY DAY 90 tablet 1   OneTouch Delica Lancets 33G MISC USE AS DIRECTED 100 each 1   PRESCRIPTION MEDICATION CPAP- At bedtime     rosuvastatin (CRESTOR) 40 MG tablet TAKE 1 TABLET BY MOUTH EVERY DAY 90 tablet 3   Semaglutide, 2 MG/DOSE, 8 MG/3ML SOPN Inject 2 mg as directed once a week.     tacrolimus (PROTOPIC) 0.1 % ointment Apply 1 application  topically 2 (two) times daily as needed (for eczema).     warfarin (COUMADIN) 7.5 MG tablet TAKE 1/2 TABLET TO 1 TABLET BY MOUTH DAILY AS DIRECTED BY THE COUMADIN CLINIC 85 tablet 0   furosemide (LASIX) 40 MG tablet Take 1 tablet (40 mg total) by mouth daily. 90 tablet 3   No facility-administered medications prior to visit.    No Known Allergies  ROS    See HPI Objective:    Physical Exam Constitutional:      General: He is not in acute distress.    Appearance: He is well-developed.  HENT:     Head: Normocephalic and atraumatic.  Cardiovascular:     Rate and Rhythm: Normal rate and regular rhythm.     Heart sounds: No murmur heard. Pulmonary:     Effort: Pulmonary effort is normal. No respiratory distress.     Breath sounds: Normal breath sounds. No wheezing or rales.  Skin:    General: Skin is warm and dry.  Neurological:     Mental Status: He is alert and oriented to person, place, and time.  Psychiatric:        Behavior: Behavior normal.        Thought Content: Thought content normal.      BP (!) 137/57 (BP Location: Right Arm, Patient Position: Sitting, Cuff Size: Large)   Pulse 77   Temp 97.9 F (36.6 C) (Oral)   Resp 16   Ht 5\' 9"  (1.753 m)   Wt 219 lb (99.3 kg)   SpO2 99%   BMI 32.34 kg/m  Wt Readings from Last 3 Encounters:  02/21/23  219 lb (99.3 kg)  01/17/23 216 lb (98 kg)  01/02/23 220 lb (99.8 kg)       Assessment & Plan:   Problem List Items Addressed This Visit       Unprioritized   Essential hypertension (Chronic)   BP stable continue metoprolol.       Dyslipidemia (Chronic)   Lab Results  Component Value Date   CHOL 140 11/13/2022   HDL 27 (L) 11/13/2022   LDLCALC 70 11/13/2022   LDLDIRECT 53 01/26/2022   TRIG 263 (H) 11/13/2022   CHOLHDL 5.2 (H) 11/13/2022   Continue fenofibrate/crestor, dietary modification.      Urinary retention   Reports that he is voiding well currently.       Type 2 diabetes mellitus with stage 3a chronic kidney disease, with long-term current use of insulin (HCC) - Primary   A1C elevated. Endo recently increased Ozempic from 1mg  to 2mg  weekly. Reinforced diet.  Relevant Orders   Basic Metabolic Panel (BMET)   RESOLVED: Stage 3b chronic kidney disease (HCC)   Stage 3 chronic kidney disease due to type 2 diabetes mellitus   Will request report from Nephrology.  Consider arb. Had tremor with ACE. Urine microalbumin is negative.       Paroxysmal atrial fibrillation (HCC)   Continues coumadin per coumadin clinic.       OSA (obstructive sleep apnea)   Continues CPAP.  Reports good compliance.       Obesity, unspecified   Wt Readings from Last 3 Encounters:  02/21/23 219 lb (99.3 kg)  01/17/23 216 lb (98 kg)  01/02/23 220 lb (99.8 kg)          I am having Darcel Bayley E. Basher maintain his Blood Glucose Monitoring Suppl, betamethasone dipropionate, AMBULATORY NON FORMULARY MEDICATION, Dexcom G6 Sensor, OneTouch Verio, OneTouch Delica Lancets 33G, tacrolimus, colchicine, Basaglar KwikPen, NovoLOG FlexPen, Insulin Pen Needle, PRESCRIPTION MEDICATION, ketoconazole, aspirin EC, isosorbide mononitrate, furosemide, dapagliflozin propanediol, rosuvastatin, Semaglutide (2 MG/DOSE), fenofibrate, metoprolol succinate, and warfarin.  No orders of the defined types  were placed in this encounter.

## 2023-02-21 NOTE — Assessment & Plan Note (Signed)
BP stable continue metoprolol ?

## 2023-02-21 NOTE — Assessment & Plan Note (Signed)
A1C elevated. Endo recently increased Ozempic from 1mg  to 2mg  weekly. Reinforced diet.

## 2023-02-21 NOTE — Assessment & Plan Note (Signed)
Continues CPAP.  Reports good compliance.

## 2023-02-21 NOTE — Assessment & Plan Note (Signed)
Wt Readings from Last 3 Encounters:  02/21/23 219 lb (99.3 kg)  01/17/23 216 lb (98 kg)  01/02/23 220 lb (99.8 kg)

## 2023-02-21 NOTE — Telephone Encounter (Signed)
Please request copy of last OV from [facility].

## 2023-02-21 NOTE — Assessment & Plan Note (Signed)
Reports that he is voiding well currently.

## 2023-02-22 ENCOUNTER — Other Ambulatory Visit: Payer: Self-pay

## 2023-02-22 ENCOUNTER — Ambulatory Visit: Payer: Medicare HMO | Attending: Cardiology | Admitting: *Deleted

## 2023-02-22 DIAGNOSIS — Z952 Presence of prosthetic heart valve: Secondary | ICD-10-CM | POA: Diagnosis not present

## 2023-02-22 DIAGNOSIS — Z7901 Long term (current) use of anticoagulants: Secondary | ICD-10-CM | POA: Diagnosis not present

## 2023-02-22 LAB — POCT INR: INR: 2.7 (ref 2.0–3.0)

## 2023-02-22 MED ORDER — SEMAGLUTIDE (2 MG/DOSE) 8 MG/3ML ~~LOC~~ SOPN: 2.0000 mg | PEN_INJECTOR | SUBCUTANEOUS | 1 refills | Status: DC

## 2023-02-22 NOTE — Patient Instructions (Addendum)
Description   Tomorrow take 1 tablet of warfarin then continue taking warfarin 1 tablet daily except 1/2 tablet on Mondays, Wednesdays, and Fridays.  Remain consistent with leafy green (increase intake to 4 servings/week).  Recheck INR in 3 weeks.  [facility]in Clinic [phone].

## 2023-02-23 ENCOUNTER — Encounter: Payer: Self-pay | Admitting: Family

## 2023-02-28 ENCOUNTER — Other Ambulatory Visit: Payer: Self-pay | Admitting: Cardiology

## 2023-03-01 ENCOUNTER — Encounter: Payer: Self-pay | Admitting: Internal Medicine

## 2023-03-08 ENCOUNTER — Other Ambulatory Visit: Payer: Self-pay | Admitting: Internal Medicine

## 2023-03-08 DIAGNOSIS — E118 Type 2 diabetes mellitus with unspecified complications: Secondary | ICD-10-CM

## 2023-03-14 ENCOUNTER — Ambulatory Visit: Payer: Medicare HMO | Attending: Cardiology

## 2023-03-14 DIAGNOSIS — Z952 Presence of prosthetic heart valve: Secondary | ICD-10-CM

## 2023-03-14 LAB — POCT INR: INR: 3.6 — AB (ref 2.0–3.0)

## 2023-03-14 NOTE — Patient Instructions (Signed)
Description   Eat greens today and continue taking warfarin 1 tablet daily except 1/2 tablet on Mondays, Wednesdays, and Fridays.  Remain consistent with leafy green (increase intake to 4 servings/week).  Recheck INR in 3 weeks.  Coumadin Clinic [phone].

## 2023-03-19 ENCOUNTER — Encounter: Payer: Self-pay | Admitting: Dietician

## 2023-03-19 ENCOUNTER — Encounter: Payer: Medicare HMO | Attending: Internal Medicine | Admitting: Dietician

## 2023-03-19 VITALS — Ht 69.0 in | Wt 211.0 lb

## 2023-03-19 DIAGNOSIS — Z794 Long term (current) use of insulin: Secondary | ICD-10-CM | POA: Diagnosis not present

## 2023-03-19 DIAGNOSIS — E1122 Type 2 diabetes mellitus with diabetic chronic kidney disease: Secondary | ICD-10-CM | POA: Diagnosis not present

## 2023-03-19 DIAGNOSIS — N1831 Chronic kidney disease, stage 3a: Secondary | ICD-10-CM | POA: Insufficient documentation

## 2023-03-19 NOTE — Patient Instructions (Addendum)
Keep only one long and one meal time insulin pen out at one time.  Avoid storing insulin in the heat or where it could freeze Continue to take your medication as prescribed Continue your mindful diet. Consider more of your meatless meals. Continue to stay active daily for about 30 minutes.

## 2023-03-19 NOTE — Progress Notes (Signed)
1405-  Diabetes Self-Management Education  Visit Type: First/Initial  Appt. Start Time: 1405 Appt. End Time: 1520  03/19/2023  [name], identified by name and date of birth, is a 78 y.o. male with a diagnosis of Diabetes: Type 2.   ASSESSMENT Patient is here today with his wife. They would like to learn how to lower triglycerides, improve his glucose, and help his kidneys. He needs his A1c to be <7.7% to qualify for his shoulder surgery. He keeps meal time insulin in each car and by his chair.  Discussed that insulin expires within about 28 days of first use.  Discussed that insulin should not free nor kept in a hot environment. Sensor reading fasting 137 this am and  180-190 many days after a meal  Referral Diagnosis:  Type 2 Diabetes with CKD and circulatory complications  History:  Type 2 Diabetes, HOH (hearing aides), OSA (on c-pap), HTN, CKD, CAD s/p CABG 2005, hypertriglyceridemia, HLD, CVA's Medications/Supplements include:  Basaglar 16 units q HS, Novolog 16 units before breakfast, 14 units before lunch, and 16 units before dinner plus sliding scale, Farxiga, Ozempic, coumadin, lasix, rousevastatin Labs noted to include:  A1c 7.9% 02/20/2023 and 7.8% 11/17/2022, BUN 33, Creatinine 1.53, Potassium 4.4, GFR 43 on 02/23/2023 Lipid Panel     Component Value Date/Time   CHOL 140 11/13/2022 0833   TRIG 263 (H) 11/13/2022 0833   HDL 27 (L) 11/13/2022 0833   CHOLHDL 5.2 (H) 11/13/2022 0833   CHOLHDL 4.5 04/10/2022 0030   VLDL 50 (H) 04/10/2022 0030   LDLCALC 70 11/13/2022 0833   LDLCALC 99 09/22/2019 1119   LDLDIRECT 53 01/26/2022 0305   LABVLDL 43 (H) 11/13/2022 0833   CGM:  Dexcom G7 - uses a receiver and does not have this with him  69" 211 lbs 03/19/2023 219 lbs 02/21/2023  Patient lives with his wife and son who is on the Autism spectrum. Patient is retired from [employer]. He goes out to eat with his son a couple of times per week.  He has now  stopped the extra fries and frappucino . He has Dr. Parke Poisson cookbook and other books and [employer] [employer] [place]. No longer eats red meat and eats some chicken or fish. Has an Instapot and air fryer Patient makes bread (whole wheat and other whole grains).    Goes to the gym 3 days per week for 30 minutes - he chooses leg exercises to help with his lower limb neuropathy.  Did Tai Chi in the past and enjoyed this.  Uses bands at times.  Shoulder problems and cannot use his left arm.  Height 5\' 9"  (1.753 m), weight 211 lb (95.7 kg). Body mass index is 31.16 kg/m.   Diabetes Self-Management Education - 03/19/23 1437       Visit Information   Visit Type First/Initial      Initial Visit   Diabetes Type Type 2    Date Diagnosed 2009    Are you currently following a meal plan? No    Are you taking your medications as prescribed? Yes      Health Coping   How would you rate your overall health? Good      Psychosocial Assessment   Patient Belief/Attitude about Diabetes Motivated to manage diabetes    What is the hardest part about your diabetes right now, causing you the most concern, or is the most worrisome to you about your diabetes?   Making healty food and beverage  choices    Self-care barriers None    Self-management support Doctor's office;Family    Other persons present Patient;Spouse/SO    Patient Concerns Nutrition/Meal planning;Problem Solving;Glycemic Control    Special Needs None    Preferred Learning Style No preference indicated    Learning Readiness Ready    How often do you need to have someone help you when you read instructions, pamphlets, or other written materials from your doctor or pharmacy? 1 - Never    What is the last grade level you completed in school? 14      Pre-Education Assessment   Patient understands the diabetes disease and treatment process. Needs Instruction    Patient understands incorporating nutritional management into lifestyle. Needs Instruction     Patient undertands incorporating physical activity into lifestyle. Needs Instruction    Patient understands using medications safely. Needs Instruction    Patient understands monitoring blood glucose, interpreting and using results Needs Instruction    Patient understands prevention, detection, and treatment of acute complications. Needs Instruction    Patient understands prevention, detection, and treatment of chronic complications. Needs Instruction    Patient understands how to develop strategies to address psychosocial issues. Needs Instruction    Patient understands how to develop strategies to promote health/change behavior. Needs Instruction      Complications   Last HgB A1C per patient/outside source 7.9 %   02/20/2023 and 7.8% 11/17/2022   How often do you check your blood sugar? 1-2 times/day    Fasting Blood glucose range (mg/dL) 086-578    Postprandial Blood glucose range (mg/dL) 469-629    Number of hypoglycemic episodes per month 0      Dietary Intake   Breakfast 1/2 plain bagel with PB, 1/2 cup V-8 tomato juice    Lunch [name] sushi bento box (salad and 2 sides cali roll)    Dinner Hibachi chicken, vegetables, fried rice OR air fried vegetables, 1 chicken patty    Snack (evening) rice cakes    Beverage(s) water, diet coke, V-8 tomato juice, other diet soda, coffee      Activity / Exercise   Activity / Exercise Type Light (walking / raking leaves)    How many days per week do you exercise? 3    How many minutes per day do you exercise? 30    Total minutes per week of exercise 90      Patient Education   Previous Diabetes Education Yes (please comment)   2020 and 2022   Disease Pathophysiology Definition of diabetes, type 1 and 2, and the diagnosis of diabetes    Healthy Eating Meal options for control of blood glucose level and chronic complications.;Role of diet in the treatment of diabetes and the relationship between the three main macronutrients and blood glucose  level;Food label reading, portion sizes and measuring food.    Being Active Identified with patient nutritional and/or medication changes necessary with exercise.    Medications Reviewed patients medication for diabetes, action, purpose, timing of dose and side effects.    Monitoring Identified appropriate SMBG and/or A1C goals.;Purpose and frequency of SMBG.    Chronic complications Identified and discussed with patient  current chronic complications;Assessed and discussed foot care and prevention of foot problems      Individualized Goals (developed by patient)   Nutrition General guidelines for healthy choices and portions discussed    Physical Activity Exercise 5-7 days per week;30 minutes per day    Medications take my medication as prescribed  Monitoring  Test my blood glucose as discussed    Problem Solving Addressing barriers to behavior change;Eating Pattern    Reducing Risk examine blood glucose patterns;treat hypoglycemia with 15 grams of carbs if blood glucose less than 70mg /dL      Post-Education Assessment   Patient understands the diabetes disease and treatment process. Demonstrates understanding / competency    Patient understands incorporating nutritional management into lifestyle. Demonstrates understanding / competency    Patient undertands incorporating physical activity into lifestyle. Demonstrates understanding / competency    Patient understands using medications safely. Demonstrates understanding / competency    Patient understands monitoring blood glucose, interpreting and using results Demonstrates understanding / competency    Patient understands prevention, detection, and treatment of acute complications. Demonstrates understanding / competency    Patient understands prevention, detection, and treatment of chronic complications. Demonstrates understanding / competency    Patient understands how to develop strategies to address psychosocial issues. Demonstrates  understanding / competency    Patient understands how to develop strategies to promote health/change behavior. Demonstrates understanding / competency      Outcomes   Expected Outcomes Demonstrated interest in learning. Expect positive outcomes    Future DMSE PRN    Program Status Completed             Individualized Plan for Diabetes Self-Management Training:   Learning Objective:  Patient will have a greater understanding of diabetes self-management. Patient education plan is to attend individual and/or group sessions per assessed needs and concerns.   Plan:   Patient Instructions  Keep only one long and one meal time insulin pen out at one time.  Avoid storing insulin in the heat or where it could freeze Continue to take your medication as prescribed Continue your mindful diet. Consider more of your meatless meals. Continue to stay active daily for about 30 minutes.   Expected Outcomes:  Demonstrated interest in learning. Expect positive outcomes  Education material provided:   If problems or questions, patient to contact team via:  Phone  Future DSME appointment: PRN

## 2023-03-21 DIAGNOSIS — E119 Type 2 diabetes mellitus without complications: Secondary | ICD-10-CM | POA: Diagnosis not present

## 2023-03-22 DIAGNOSIS — I10 Essential (primary) hypertension: Secondary | ICD-10-CM | POA: Diagnosis not present

## 2023-03-22 DIAGNOSIS — G4733 Obstructive sleep apnea (adult) (pediatric): Secondary | ICD-10-CM | POA: Diagnosis not present

## 2023-04-03 DIAGNOSIS — Z859 Personal history of malignant neoplasm, unspecified: Secondary | ICD-10-CM | POA: Diagnosis not present

## 2023-04-03 DIAGNOSIS — L814 Other melanin hyperpigmentation: Secondary | ICD-10-CM | POA: Diagnosis not present

## 2023-04-03 DIAGNOSIS — L700 Acne vulgaris: Secondary | ICD-10-CM | POA: Diagnosis not present

## 2023-04-03 DIAGNOSIS — L570 Actinic keratosis: Secondary | ICD-10-CM | POA: Diagnosis not present

## 2023-04-03 DIAGNOSIS — L821 Other seborrheic keratosis: Secondary | ICD-10-CM | POA: Diagnosis not present

## 2023-04-03 DIAGNOSIS — D1801 Hemangioma of skin and subcutaneous tissue: Secondary | ICD-10-CM | POA: Diagnosis not present

## 2023-04-04 ENCOUNTER — Ambulatory Visit: Payer: Medicare HMO | Attending: Cardiology

## 2023-04-04 DIAGNOSIS — Z952 Presence of prosthetic heart valve: Secondary | ICD-10-CM | POA: Diagnosis not present

## 2023-04-04 LAB — POCT INR: INR: 3.6 — AB (ref 2.0–3.0)

## 2023-04-04 NOTE — Patient Instructions (Addendum)
 Description   Eat greens today and continue taking warfarin 1 tablet daily except 1/2 tablet on Mondays, Wednesdays, and Fridays.  Remain consistent with leafy green (increase intake to 4 servings/week).  Recheck INR in 4 weeks.  [facility] [phone].

## 2023-04-09 DIAGNOSIS — G43809 Other migraine, not intractable, without status migrainosus: Secondary | ICD-10-CM | POA: Diagnosis not present

## 2023-04-09 DIAGNOSIS — H524 Presbyopia: Secondary | ICD-10-CM | POA: Diagnosis not present

## 2023-04-09 DIAGNOSIS — H52223 Regular astigmatism, bilateral: Secondary | ICD-10-CM | POA: Diagnosis not present

## 2023-04-09 DIAGNOSIS — H2513 Age-related nuclear cataract, bilateral: Secondary | ICD-10-CM | POA: Diagnosis not present

## 2023-04-09 DIAGNOSIS — H0102A Squamous blepharitis right eye, upper and lower eyelids: Secondary | ICD-10-CM | POA: Diagnosis not present

## 2023-04-09 DIAGNOSIS — E119 Type 2 diabetes mellitus without complications: Secondary | ICD-10-CM | POA: Diagnosis not present

## 2023-04-09 DIAGNOSIS — H5203 Hypermetropia, bilateral: Secondary | ICD-10-CM | POA: Diagnosis not present

## 2023-04-09 LAB — HM DIABETES EYE EXAM

## 2023-04-20 DIAGNOSIS — E119 Type 2 diabetes mellitus without complications: Secondary | ICD-10-CM | POA: Diagnosis not present

## 2023-04-22 ENCOUNTER — Encounter: Payer: Self-pay | Admitting: Internal Medicine

## 2023-04-22 ENCOUNTER — Other Ambulatory Visit: Payer: Self-pay | Admitting: Internal Medicine

## 2023-05-02 ENCOUNTER — Ambulatory Visit: Attending: Cardiology

## 2023-05-02 DIAGNOSIS — Z952 Presence of prosthetic heart valve: Secondary | ICD-10-CM | POA: Diagnosis not present

## 2023-05-02 LAB — POCT INR: INR: 2.6 (ref 2.0–3.0)

## 2023-05-02 NOTE — Patient Instructions (Signed)
 Description   Take 1 tablet today and then continue taking warfarin 1 tablet daily except 1/2 tablet on Mondays, Wednesdays, and Fridays.  Remain consistent with leafy green (increase intake to 4 servings/week).  Recheck INR in 3 weeks.  [facility] [phone].

## 2023-05-04 ENCOUNTER — Encounter: Payer: Self-pay | Admitting: Family

## 2023-05-04 ENCOUNTER — Encounter: Payer: Self-pay | Admitting: Internal Medicine

## 2023-05-04 ENCOUNTER — Ambulatory Visit: Payer: Medicare HMO | Admitting: Internal Medicine

## 2023-05-04 VITALS — Ht 69.0 in

## 2023-05-04 DIAGNOSIS — N1831 Chronic kidney disease, stage 3a: Secondary | ICD-10-CM | POA: Diagnosis not present

## 2023-05-04 DIAGNOSIS — E1142 Type 2 diabetes mellitus with diabetic polyneuropathy: Secondary | ICD-10-CM | POA: Diagnosis not present

## 2023-05-04 DIAGNOSIS — E1159 Type 2 diabetes mellitus with other circulatory complications: Secondary | ICD-10-CM

## 2023-05-04 DIAGNOSIS — E1122 Type 2 diabetes mellitus with diabetic chronic kidney disease: Secondary | ICD-10-CM | POA: Diagnosis not present

## 2023-05-04 DIAGNOSIS — Z794 Long term (current) use of insulin: Secondary | ICD-10-CM

## 2023-05-04 LAB — POCT GLYCOSYLATED HEMOGLOBIN (HGB A1C): Hemoglobin A1C: 6.5 % — AB (ref 4.0–5.6)

## 2023-05-04 NOTE — Patient Instructions (Addendum)
-   A1c 6.5%  - Continue  Ozempic 2 mg weekly - Continue Farxiga 10 mg , 1 tablet every morning  - Continue  Basaglar 16 units daily  - Continue  Novolog to 16 units with Breakfast, 14 units with Lunch and 16 units with Supper   Novolog correctional insulin: ADD extra units on insulin to your meal-time Novolog dose if your blood sugars are higher than 185. Use the scale below to help guide you:   Blood sugar before meal Number of units to inject  Less than 185 0 unit  186 -  210 1 units  211 -  235 2 units  236 -  260 3 units  261 -  285 4 units  286 -  310 5 units  311 -  335 6 units  336 -  360 7 units  361 -  385 8 units  386 - 410  9 units       HOW TO TREAT LOW BLOOD SUGARS (Blood sugar LESS THAN 70 MG/DL) Please follow the RULE OF 15 for the treatment of hypoglycemia treatment (when your (blood sugars are less than 70 mg/dL)   STEP 1: Take 15 grams of carbohydrates when your blood sugar is low, which includes:  3-4 GLUCOSE TABS  OR 3-4 OZ OF JUICE OR REGULAR SODA OR ONE TUBE OF GLUCOSE GEL    STEP 2: RECHECK blood sugar in 15 MINUTES STEP 3: If your blood sugar is still low at the 15 minute recheck --> then, go back to STEP 1 and treat AGAIN with another 15 grams of carbohydrates.

## 2023-05-04 NOTE — Progress Notes (Signed)
 Name: [name]  Age/ Sex: 78 y.o., male   MRN/ DOB: [id number], 1945-07-23     PCP: Sandford Craze, NP   Reason for Endocrinology Evaluation: Type 2 Diabetes Mellitus  Initial Endocrine Consultative Visit: 11/11/2018    PATIENT IDENTIFIER: [name] is a 78 y.o. male with a past medical history of T2DM, OSA, HTN, CAD ( S/P CABG) . The patient has followed with Endocrinology clinic since 11/11/2018 for consultative assistance with management of his diabetes.  DIABETIC HISTORY:  [name] was diagnosed with T2DM in 2005. He was on metformin which was stopped due to renal dysfunction . Has been on insulin since 2015. His hemoglobin A1c has ranged from 6.3% in 2015, peaking at 8.3% in 2020.  On his initial visit to our clinic he was on basal insulin only. With an A1c of 8.3. Trulicity was added.    Novolog started 04/2020 due to an A1c 9.6%  Stopped trulicity due to cost  Started Farxiga 07/2020 through pt assistance program   Started Ozempic 05/2022  Patient met with Oran Rein 03/2023  SUBJECTIVE:   During the last visit (01/02/2023): A1c of 7.8%.    Today (05/04/2023): [name] is here for a  follow up on diabetes management. He is accompanied by his wife today . He checks his blood sugars multiple  times daily, through dexcom.  The patient has not had hypoglycemic episodes since the last clinic visit.  He follows with cardiology for prior aortic valve replacement and thoracic aortic aneurysm monitoring as well as dyslipidemia and A.Fib Pending left shoulder sx  Denies nausea, or diarrhea Denies constipation or diarrhea     HOME DIABETES REGIMEN:  Farxiga 10 mg daily-patient assistance Ozempic 2 mg weekly-patient assistance Basaglar 16  units daily  Novolog 16/14/16 units TIDQAC Cf: Novlog (bG-130/25)   CONTINUOUS GLUCOSE MONITORING RECORD INTERPRETATION    Dates of Recording: 3/22-05/04/2023  Sensor description: dexcom  Results  statistics:   CGM use % of time 99  Average and SD 135/34  Time in range    90    %  % Time Above 180 9  % Time above 250 1  % Time Below target 0   Glycemic patterns summary: BGs are optimal throughout the day and night  Hyperglycemic episodes post prandial  Hypoglycemic episodes occurred N/A  Overnight periods: optimal optimal      DIABETIC COMPLICATIONS: Microvascular complications:  CKDIII- Dr. Signe Colt  Denies: neuropathy, retinopathy  Last eye exam: Completed 04/09/2023    Macrovascular complications:  CAD (S/P CABG )  Denies: PVD, CVA    HISTORY:  Past Medical History:  Past Medical History:  Diagnosis Date   Actinic keratosis 02/14/2011   Allergy    Aneurysm of ascending aorta without rupture (HCC) 01/06/2022   Aortic stenosis    Arthritis    Bilateral sensorineural hearing loss 06/16/2009   CAD (coronary artery disease)    cabg   Complication of anesthesia    Coronary atherosclerosis of native coronary artery 06/15/2009   Diabetic polyneuropathy 11/11/2018   Dyslipidemia 11/05/2018   Embolic stroke involving left middle cerebral artery  05/30/2021   Erectile dysfunction 05/18/2009   Essential hypertension 06/16/2009   Expressive aphasia 06/25/2021   External hemorrhoids 03/21/2010   Gout 04/09/2015   Heart murmur    Hemochromatosis 02/14/2011   History of aortic valve replacement 10/13/2009   History of CABG 11/05/2018   2005- 1. Coronary artery bypass grafting x4 with  LIMA-LAD, SVG-OM1, SVG-AM and dCFX.  2. Aortic valve replacement with [employer] AVR 3. Septal myomectomy.  4. Myoview low risk 2016   History of hepatitis 01/31/1972   Hypertriglyceridemia 06/13/2010   Hypertrophic obstructive cardiomyopathy 05/06/2008   SP septal myomectomy at surgery 2005 Echo Nov 2018- Hyperdynamic LVEF with severe basal septal hypertrophy. There is chordal SAM with resting gradient of 16 mmHg that increases to 85 mmHg with Valsalva        Leg weakness, bilateral  01/18/2021   Long term (current) use of anticoagulants 05/22/2018   Lumbar stenosis with neurogenic claudication 02/04/2021   Malignant neoplasm of prostate 06/17/2009   Mechanical heart valve present    Manufacturer: [employer] Duty #: [id number]  Model #: [id number]. Card states MRI compatible with 3 teslas or less.   Obesity, unspecified 04/29/2008   OSA (obstructive sleep apnea) 07/12/2012   moderate-severe; uses CPAP nightly   PONV (postoperative nausea and vomiting)    after CABG- slow to wake up   Presence of prosthetic heart valve    Primary osteoarthritis of left shoulder 10/31/2018   S/P lumbar laminectomy 02/04/2021   Stage 3 chronic kidney disease due to type 2 diabetes mellitus 11/11/2018   Tobacco use 06/16/2009   Type 2 diabetes mellitus with hyperglycemia    Ventral hernia 03/03/2014   Past Surgical History:  Past Surgical History:  Procedure Laterality Date   AORTIC VALVE REPLACEMENT  11/14/2003   [employer] [facility]   APPENDECTOMY  1990   CHOLECYSTECTOMY  1990   CORONARY ANGIOGRAPHY  10/11/2021   CORONARY ARTERY BYPASS GRAFT  10/2003   HERNIA REPAIR  1999   right, inguinal   HERNIA REPAIR  2002   left, inguinal   HIP SURGERY  2006   right hip   IR CT HEAD LTD  02/03/2022   IR PERCUTANEOUS ART THROMBECTOMY/INFUSION INTRACRANIAL INC DIAG ANGIO  01/24/2022   LUMBAR LAMINECTOMY/DECOMPRESSION MICRODISCECTOMY Bilateral 02/04/2021   Procedure: Bilateral Lumbar Two-Three Laminectomy;  Surgeon: [name], MD;  Location: [facility] OR;  Service: Neurosurgery;  Laterality: Bilateral;  3C/RM 20   PILONIDAL CYST EXCISION  1964   prostate seed implant  03/2010   RADIOLOGY WITH ANESTHESIA N/A 01/24/2022   Procedure: IR WITH ANESTHESIA;  Surgeon: Radiologist, Medication, MD;  Location: [facility] OR;  Service: Radiology;  Laterality: N/A;   TEE WITHOUT CARDIOVERSION N/A 03/14/2022   Procedure: TRANSESOPHAGEAL ECHOCARDIOGRAM (TEE);  Surgeon: Parke Poisson, MD;  Location: [facility] ENDOSCOPY;   Service: Cardiology;  Laterality: N/A;   TONSILLECTOMY  childhood   Social History:  reports that he has been smoking cigars. He has never used smokeless tobacco. He reports that he does not currently use alcohol. He reports that he does not use drugs. Family History:  Family History  Problem Relation Age of Onset   Heart disease Mother    Stroke Mother    Heart disease Father    Asperger's syndrome Son    Hyperlipidemia Son    Coronary artery disease Brother    Cancer Neg Hx        negative for colon cancer     HOME MEDICATIONS: Allergies as of 05/04/2023   No Known Allergies      Medication List        Accurate as of May 04, 2023  1:09 PM. If you have any questions, ask your nurse or doctor.          AMBULATORY NON FORMULARY MEDICATION cpap cushions  AirFit F20 (Size: Large)           AirFit F30 (Size: Med)   Please FAX the prescription to:  [phone]   aspirin EC 81 MG tablet Take 1 tablet (81 mg total) by mouth daily. Swallow whole.   Basaglar KwikPen 100 UNIT/ML INJECT 22 UNITS INTO THE SKIN DAILY.   BD Pen Needle Nano 2nd Gen 32G X 4 MM Misc Generic drug: Insulin Pen Needle 1 DEVICE BY DOES NOT APPLY ROUTE IN THE MORNING, AT NOON, IN THE EVENING, AND AT BEDTIME.   betamethasone dipropionate 0.05 % cream Apply topically 2 (two) times daily as needed. To eczema rash What changed:  how much to take reasons to take this additional instructions   Blood Glucose Monitoring Suppl Supplies Misc Use for monitoring glucose level   colchicine 0.6 MG tablet TAKE 1 TABLET (0.6 MG TOTAL) BY MOUTH 2 (TWO) TIMES DAILY AS NEEDED.   dapagliflozin propanediol 10 MG Tabs tablet Commonly known as: Farxiga Take 1 tablet (10 mg total) by mouth daily before breakfast.   Dexcom G6 Sensor Misc 1 Device by Does not apply route as directed.   fenofibrate 145 MG tablet Commonly known as: TRICOR TAKE 1 TABLET BY MOUTH EVERY DAY   furosemide 40 MG  tablet Commonly known as: LASIX Take 1 tablet (40 mg total) by mouth daily.   isosorbide mononitrate 30 MG 24 hr tablet Commonly known as: IMDUR TAKE 1 TABLET BY MOUTH EVERY DAY   ketoconazole 2 % cream Commonly known as: NIZORAL Apply 1 Application topically daily as needed for irritation.   metoprolol succinate 25 MG 24 hr tablet Commonly known as: TOPROL-XL TAKE 1 TABLET BY MOUTH EVERY DAY   NovoLOG FlexPen 100 UNIT/ML FlexPen Generic drug: insulin aspart Max daily 75 units What changed:  how much to take how to take this when to take this additional instructions   OneTouch Delica Lancets 33G Misc USE AS DIRECTED   OneTouch Verio test strip Generic drug: glucose blood Use as instructed   PRESCRIPTION MEDICATION CPAP- At bedtime   rosuvastatin 40 MG tablet Commonly known as: CRESTOR TAKE 1 TABLET BY MOUTH EVERY DAY   Semaglutide (2 MG/DOSE) 8 MG/3ML Sopn Inject 2 mg as directed once a week.   Shingrix injection Generic drug: Zoster Vaccine Adjuvanted   tacrolimus 0.1 % ointment Commonly known as: PROTOPIC Apply 1 application  topically 2 (two) times daily as needed (for eczema).   warfarin 7.5 MG tablet Commonly known as: COUMADIN Take as directed by the anticoagulation clinic. If you are unsure how to take this medication, talk to your nurse or doctor. Original instructions: TAKE 1/2 TABLET TO 1 TABLET BY MOUTH DAILY AS DIRECTED BY THE COUMADIN CLINIC         OBJECTIVE:   Vital Signs: Ht 5\' 9"  (1.753 m)   BMI 31.16 kg/m   Wt Readings from Last 3 Encounters:  03/19/23 211 lb (95.7 kg)  02/21/23 219 lb (99.3 kg)  01/17/23 216 lb (98 kg)     Exam: General: Pt appears well and is in NAD  Lungs: Clear with good BS bilat   Heart: RRR   Extremities: Trace  pretibial edema.   Neuro: MS is good with appropriate affect, pt is alert and Ox3      DM Foot Exam 05/04/2023  The skin of the feet is intact without sores or ulcerations. The pedal  pulses are 1+ on right and 1+ on left. The sensation is decreased  to a screening 5.07, 10 gram monofilament bilaterally    DATA REVIEWED:  Lab Results  Component Value Date   HGBA1C 6.5 (A) 05/04/2023   HGBA1C 7.9 (H) 02/20/2023   HGBA1C 7.8 (H) 11/17/2022    Latest Reference Range & Units 02/21/23 09:39  Sodium 135 - 145 mEq/L 138  Potassium 3.5 - 5.1 mEq/L 4.4  Chloride 96 - 112 mEq/L 101  CO2 19 - 32 mEq/L 30  Glucose 70 - 99 mg/dL 086 (H)  BUN 6 - 23 mg/dL 33 (H)  Creatinine 5.78 - 1.50 mg/dL 4.69 (H)  Calcium 8.4 - 10.5 mg/dL 9.4  GFR >62.95 mL/min 43.46 (L)  (H): Data is abnormally high (L): Data is abnormally low  ASSESSMENT / PLAN / RECOMMENDATIONS:   1) Type 2 Diabetes Mellitus, Sub-Optimally  controlled, With CKD III , Neuropathic and Macrovascular complications - Most recent A1c of 6.5 %. Goal A1c < 7.5%.  -A1c is optimal -He has a pending left shoulder surgery -Did not qualify for patient assistance for Ozempic this year, cost is  $175 -No changes at this time  MEDICATIONS: -Continue Ozempic 2 mg once weekly -Continue  Farxiga 10 mg , 1 tablet every morning  -Continue  Basaglar 16 units daily  -Continue  Novolog 16 units with Breakfast, 14 units with lunch and 16 units with Supper -Continue Correction Scale : Humalog (BG -130/25)     EDUCATION / INSTRUCTIONS: BG monitoring instructions: Patient is instructed to check his blood sugars 3 times a day, before each meal. Call [facility] if: BG persistently < 70 I reviewed the Rule of 15 for the treatment of hypoglycemia in detail with the patient. Literature supplied.   2) Diabetic complications:  Eye: Does not have known diabetic retinopathy. Neuro/ Feet: Does have known diabetic peripheral neuropathy. Renal: Patient does have known baseline CKD. He is on an ACEI/ARB at present. He sees Dr. Signe Colt    F/U in 6 months   I spent 25 minutes preparing to see the patient by review of recent  labs, imaging and procedures, obtaining and reviewing separately obtained history, communicating with the patient/family or caregiver, ordering medications, tests or procedures, and documenting clinical information in the EHR including the differential Dx, treatment, and any further evaluation and other management   Signed electronically by: Lyndle Herrlich, MD  [facility] Endocrinology  [facility] [address] [place], [place] [address] Phone: [phone] FAX: [phone]   CC: Sandford Craze, NP [address] [place] [address] Phone: [phone]  Fax: [phone]  Return to Endocrinology clinic as below: Future Appointments  Date Time Provider Department Center  05/23/2023 11:20 AM Sandford Craze, NP [facility]  05/23/2023 11:45 AM [facility] None  06/06/2023  3:10 PM Ronney Asters, NP [facility] None  10/02/2023 11:30 AM Marcos Eke, PA-C [facility] None  11/07/2023 11:30 AM Jorden Mahl, Konrad Dolores, MD [facility] None

## 2023-05-16 ENCOUNTER — Ambulatory Visit (INDEPENDENT_AMBULATORY_CARE_PROVIDER_SITE_OTHER): Admitting: Family

## 2023-05-16 VITALS — BP 104/54 | HR 70 | Temp 97.7°F | Resp 16 | Ht 69.0 in | Wt 204.0 lb

## 2023-05-16 DIAGNOSIS — I7121 Aneurysm of the ascending aorta, without rupture: Secondary | ICD-10-CM | POA: Diagnosis not present

## 2023-05-16 DIAGNOSIS — G4733 Obstructive sleep apnea (adult) (pediatric): Secondary | ICD-10-CM

## 2023-05-16 DIAGNOSIS — E1142 Type 2 diabetes mellitus with diabetic polyneuropathy: Secondary | ICD-10-CM | POA: Diagnosis not present

## 2023-05-16 DIAGNOSIS — Z794 Long term (current) use of insulin: Secondary | ICD-10-CM | POA: Diagnosis not present

## 2023-05-16 DIAGNOSIS — Z8673 Personal history of transient ischemic attack (TIA), and cerebral infarction without residual deficits: Secondary | ICD-10-CM

## 2023-05-16 DIAGNOSIS — E785 Hyperlipidemia, unspecified: Secondary | ICD-10-CM | POA: Diagnosis not present

## 2023-05-16 DIAGNOSIS — I10 Essential (primary) hypertension: Secondary | ICD-10-CM | POA: Diagnosis not present

## 2023-05-16 NOTE — Assessment & Plan Note (Signed)
 BP Readings from Last 3 Encounters:  05/16/23 (!) 104/54  02/21/23 (!) 137/57  01/17/23 (!) 126/47  Stable on metoprol xl 25mg .  Continue same.

## 2023-05-16 NOTE — Assessment & Plan Note (Signed)
 Unchanged 10/24 at 4.3 cm.  Plan to repeat in 1 year.

## 2023-05-16 NOTE — Assessment & Plan Note (Signed)
 Lab Results  Component Value Date   HGBA1C 6.5 (A) 05/04/2023   HGBA1C 7.9 (H) 02/20/2023   HGBA1C 7.8 (H) 11/17/2022   Lab Results  Component Value Date   MICROALBUR <0.7 01/17/2023   LDLCALC 70 11/13/2022   CREATININE 1.53 (H) 02/21/2023   Much better A1C on Ozempic, farxiga, basaglar 16 units.

## 2023-05-16 NOTE — Assessment & Plan Note (Signed)
 Good compliance with cpap.

## 2023-05-16 NOTE — Assessment & Plan Note (Signed)
 Lab Results  Component Value Date   CHOL 140 11/13/2022   HDL 27 (L) 11/13/2022   LDLCALC 70 11/13/2022   LDLDIRECT 53 01/26/2022   TRIG 263 (H) 11/13/2022   CHOLHDL 5.2 (H) 11/13/2022   LDL at goal on crestor.

## 2023-05-16 NOTE — Progress Notes (Unsigned)
 Subjective:     Patient ID: [name], male    DOB: 06-23-1945, 78 y.o.   MRN: [id number]  Chief Complaint  Patient presents with   Diabetes    Here for follow up    HPI  Discussed the use of AI scribe software for clinical note transcription with the patient, who gave verbal consent to proceed.  History of Present Illness  He is a 78 year old male with diabetes and abdominal aortic aneurysm who presents for routine follow-up and medication management.  He has experienced significant weight loss, decreasing from 219 pounds in January to 204 pounds, attributed to the use of Ozempic. The dose of Ozempic was recently doubled, leading to a reduction in appetite and portion sizes. His A1c levels have improved.  He has a history of an abdominal aortic aneurysm, which has been stable at 4.3 cm. He undergoes annual monitoring, and the size has not increased since the last measurement.  He is currently taking Crestor 40 mg for cholesterol management, with his LDL levels at 70 as of October, which is within the target range. He is also on metoprolol XL 25 mg for blood pressure management, which is reportedly well-controlled.  He uses a CPAP machine for sleep apnea and is compliant with its use. He is also on warfarin for anticoagulation, with a recent INR of 2.6. There was a recent adjustment in his warfarin dose and dietary changes to manage his INR levels.  He has a history of gout but reports no recent flares and does not currently have colchicine on hand. He manages his gout with medication as needed.  He takes Hospital doctor at a dose of 16 units. He also reports taking a diuretic, which has helped with his edema, and his feet are reportedly in good condition.    Wt Readings from Last 3 Encounters:  05/16/23 204 lb (92.5 kg)  03/19/23 211 lb (95.7 kg)  02/21/23 219 lb (99.3 kg)     Health Maintenance Due  Topic Date Due   DTaP/Tdap/Td (3 - Td or Tdap) 03/01/2022   COVID-19  Vaccine (5 - 2024-25 season) 10/01/2022   Zoster Vaccines- Shingrix (2 of 2) 05/01/2023    Past Medical History:  Diagnosis Date   Actinic keratosis 02/14/2011   Allergy    Aneurysm of ascending aorta without rupture (HCC) 01/06/2022   Aortic stenosis    Arthritis    Bilateral sensorineural hearing loss 06/16/2009   CAD (coronary artery disease)    cabg   Complication of anesthesia    Coronary atherosclerosis of native coronary artery 06/15/2009   Diabetic polyneuropathy 11/11/2018   Dyslipidemia 11/05/2018   Embolic stroke involving left middle cerebral artery  05/30/2021   Erectile dysfunction 05/18/2009   Essential hypertension 06/16/2009   Expressive aphasia 06/25/2021   External hemorrhoids 03/21/2010   Gout 04/09/2015   Heart murmur    Hemochromatosis 02/14/2011   History of aortic valve replacement 10/13/2009   History of CABG 11/05/2018   2005- 1. Coronary artery bypass grafting x4 with LIMA-LAD, SVG-OM1, SVG-AM and dCFX.  2. Aortic valve replacement with [employer] Jude AVR 3. Septal myomectomy.  4. Myoview low risk 2016   History of hepatitis 01/31/1972   Hypertriglyceridemia 06/13/2010   Hypertrophic obstructive cardiomyopathy 05/06/2008   SP septal myomectomy at surgery 2005 Echo Nov 2018- Hyperdynamic LVEF with severe basal septal hypertrophy. There is chordal SAM with resting gradient of 16 mmHg that increases to 85 mmHg with Valsalva  Leg weakness, bilateral 01/18/2021   Long term (current) use of anticoagulants 05/22/2018   Lumbar stenosis with neurogenic claudication 02/04/2021   Malignant neoplasm of prostate 06/17/2009   Mechanical heart valve present    Manufacturer: [employer] Duty #: [id number]  Model #: [id number]. Card states MRI compatible with 3 teslas or less.   Obesity, unspecified 04/29/2008   OSA (obstructive sleep apnea) 07/12/2012   moderate-severe; uses CPAP nightly   PONV (postoperative nausea and vomiting)    after CABG- slow to wake up    Presence of prosthetic heart valve    Primary osteoarthritis of left shoulder 10/31/2018   S/P lumbar laminectomy 02/04/2021   Stage 3 chronic kidney disease due to type 2 diabetes mellitus 11/11/2018   Tobacco use 06/16/2009   Type 2 diabetes mellitus with hyperglycemia    Ventral hernia 03/03/2014    Past Surgical History:  Procedure Laterality Date   AORTIC VALVE REPLACEMENT  11/14/2003   [facility]   APPENDECTOMY  1990   CHOLECYSTECTOMY  1990   CORONARY ANGIOGRAPHY  10/11/2021   CORONARY ARTERY BYPASS GRAFT  10/2003   HERNIA REPAIR  1999   right, inguinal   HERNIA REPAIR  2002   left, inguinal   HIP SURGERY  2006   right hip   IR CT HEAD LTD  02/03/2022   IR PERCUTANEOUS ART THROMBECTOMY/INFUSION INTRACRANIAL INC DIAG ANGIO  01/24/2022   LUMBAR LAMINECTOMY/DECOMPRESSION MICRODISCECTOMY Bilateral 02/04/2021   Procedure: Bilateral Lumbar Two-Three Laminectomy;  Surgeon: [name], MD;  Location: [facility] OR;  Service: Neurosurgery;  Laterality: Bilateral;  3C/RM 20   PILONIDAL CYST EXCISION  1964   prostate seed implant  03/2010   RADIOLOGY WITH ANESTHESIA N/A 01/24/2022   Procedure: IR WITH ANESTHESIA;  Surgeon: Radiologist, Medication, MD;  Location: [facility] OR;  Service: Radiology;  Laterality: N/A;   TEE WITHOUT CARDIOVERSION N/A 03/14/2022   Procedure: TRANSESOPHAGEAL ECHOCARDIOGRAM (TEE);  Surgeon: Parke Poisson, MD;  Location: [facility] ENDOSCOPY;  Service: Cardiology;  Laterality: N/A;   TONSILLECTOMY  childhood    Family History  Problem Relation Age of Onset   Heart disease Mother    Stroke Mother    Heart disease Father    Asperger's syndrome Son    Hyperlipidemia Son    Coronary artery disease Brother    Cancer Neg Hx        negative for colon cancer    Social History   Socioeconomic History   Marital status: Married    Spouse name: Not on file   Number of children: Not on file   Years of education: 18   Highest education level: Master's degree (e.g., MA,  MS, MEng, MEd, MSW, MBA)  Occupational History   Occupation: Retired  Tobacco Use   Smoking status: Some Days    Types: Cigars    Last attempt to quit: 06/2021    Years since quitting: 1.8   Smokeless tobacco: Never   Tobacco comments:    Pt states he smokes 1 cigar about once a month. 06/01/2021    Pt quit  Cigars 06/2021  Substance and Sexual Activity   Alcohol use: Not Currently    Comment: wine maybe 1 per month   Drug use: No   Sexual activity: Never  Other Topics Concern   Not on file  Social History Narrative   ** Merged History Encounter ** Right Handed    Lives in a one sotyr home. One step leads to front  Lives with wife   Retired   Occasionally caffeine   Social Drivers of Corporate investment banker Strain: Low Risk  (01/13/2023)   Overall Financial Resource Strain (CARDIA)    Difficulty of Paying Living Expenses: Not very hard  Food Insecurity: No Food Insecurity (01/13/2023)   Hunger Vital Sign    Worried About Running Out of Food in the Last Year: Never true    Ran Out of Food in the Last Year: Never true  Transportation Needs: No Transportation Needs (01/13/2023)   PRAPARE - Administrator, Civil Service (Medical): No    Lack of Transportation (Non-Medical): No  Physical Activity: Insufficiently Active (01/13/2023)   Exercise Vital Sign    Days of Exercise per Week: 3 days    Minutes of Exercise per Session: 30 min  Stress: No Stress Concern Present (01/13/2023)   [employer] of Occupational Health - Occupational Stress Questionnaire    Feeling of Stress : Not at all  Social Connections: Moderately Isolated (01/13/2023)   Social Connection and Isolation Panel [NHANES]    Frequency of Communication with Friends and Family: Once a week    Frequency of Social Gatherings with Friends and Family: Once a week    Attends Religious Services: More than 4 times per year    Active Member of [employer] or Organizations: No    Attends Tax inspector Meetings: Never    Marital Status: Married  Catering manager Violence: Not At Risk (08/02/2022)   Humiliation, Afraid, Rape, and Kick questionnaire    Fear of Current or Ex-Partner: No    Emotionally Abused: No    Physically Abused: No    Sexually Abused: No    Outpatient Medications Prior to Visit  Medication Sig Dispense Refill   AMBULATORY NON FORMULARY MEDICATION cpap cushions            AirFit F20 (Size: Large)           AirFit F30 (Size: Med)   Please FAX the prescription to:  [phone] 12 each prn   aspirin EC 81 MG tablet Take 1 tablet (81 mg total) by mouth daily. Swallow whole. 30 tablet 12   BD PEN NEEDLE NANO 2ND GEN 32G X 4 MM MISC 1 DEVICE BY DOES NOT APPLY ROUTE IN THE MORNING, AT NOON, IN THE EVENING, AND AT BEDTIME. 400 each 3   betamethasone dipropionate (DIPROLENE) 0.05 % cream Apply topically 2 (two) times daily as needed. To eczema rash (Patient taking differently: Apply 1 application  topically 2 (two) times daily as needed (to eczema/rash).) 30 g 1   Blood Glucose Monitoring Suppl Supplies MISC Use for monitoring glucose level 100 each 1   colchicine 0.6 MG tablet TAKE 1 TABLET (0.6 MG TOTAL) BY MOUTH 2 (TWO) TIMES DAILY AS NEEDED. 180 tablet 0   Continuous Blood Gluc Sensor (DEXCOM G6 SENSOR) MISC 1 Device by Does not apply route as directed. 9 each 3   dapagliflozin propanediol (FARXIGA) 10 MG TABS tablet Take 1 tablet (10 mg total) by mouth daily before breakfast. 30 tablet 0   fenofibrate (TRICOR) 145 MG tablet TAKE 1 TABLET BY MOUTH EVERY DAY 90 tablet 1   glucose blood (ONETOUCH VERIO) test strip Use as instructed 100 strip 12   insulin aspart (NOVOLOG FLEXPEN) 100 UNIT/ML FlexPen Max daily 75 units (Patient taking differently: Inject 14-19 Units into the skin See admin instructions. Inject 16 units into the skin with breakfast, 14 units with lunch, and  16 units with supper/evening meal- may increase to up to 19 units per dose as needed for high  blood sugar) 75 mL 3   Insulin Glargine (BASAGLAR KWIKPEN) 100 UNIT/ML INJECT 22 UNITS INTO THE SKIN DAILY. 30 mL 1   isosorbide mononitrate (IMDUR) 30 MG 24 hr tablet TAKE 1 TABLET BY MOUTH EVERY DAY 90 tablet 1   ketoconazole (NIZORAL) 2 % cream Apply 1 Application topically daily as needed for irritation.     metoprolol succinate (TOPROL-XL) 25 MG 24 hr tablet TAKE 1 TABLET BY MOUTH EVERY DAY 90 tablet 1   OneTouch Delica Lancets 33G MISC USE AS DIRECTED 100 each 1   PRESCRIPTION MEDICATION CPAP- At bedtime     rosuvastatin (CRESTOR) 40 MG tablet TAKE 1 TABLET BY MOUTH EVERY DAY 90 tablet 3   Semaglutide, 2 MG/DOSE, 8 MG/3ML SOPN Inject 2 mg as directed once a week. 6 mL 1   SHINGRIX injection      tacrolimus (PROTOPIC) 0.1 % ointment Apply 1 application  topically 2 (two) times daily as needed (for eczema).     warfarin (COUMADIN) 7.5 MG tablet TAKE 1/2 TABLET TO 1 TABLET BY MOUTH DAILY AS DIRECTED BY THE COUMADIN CLINIC 85 tablet 0   furosemide (LASIX) 40 MG tablet Take 1 tablet (40 mg total) by mouth daily. 90 tablet 3   No facility-administered medications prior to visit.    No Known Allergies  ROS See HPI    Objective:    Physical Exam Constitutional:      General: He is not in acute distress.    Appearance: He is well-developed.  HENT:     Head: Normocephalic and atraumatic.  Cardiovascular:     Rate and Rhythm: Normal rate and regular rhythm.     Heart sounds: Murmur heard.     Comments: Mechanical valve click noted Pulmonary:     Effort: Pulmonary effort is normal. No respiratory distress.     Breath sounds: Normal breath sounds. No wheezing or rales.  Skin:    General: Skin is warm and dry.  Neurological:     Mental Status: He is alert and oriented to person, place, and time.  Psychiatric:        Behavior: Behavior normal.        Thought Content: Thought content normal.      BP (!) 104/54 (BP Location: Right Arm, Patient Position: Sitting, Cuff Size: Large)    Pulse 70   Temp 97.7 F (36.5 C) (Oral)   Resp 16   Ht 5\' 9"  (1.753 m)   Wt 204 lb (92.5 kg)   SpO2 98%   BMI 30.13 kg/m  Wt Readings from Last 3 Encounters:  05/16/23 204 lb (92.5 kg)  03/19/23 211 lb (95.7 kg)  02/21/23 219 lb (99.3 kg)       Assessment & Plan:   Problem List Items Addressed This Visit       Unprioritized   Essential hypertension (Chronic)   BP Readings from Last 3 Encounters:  05/16/23 (!) 104/54  02/21/23 (!) 137/57  01/17/23 (!) 126/47  Stable on metoprol xl 25mg .  Continue same.        Dyslipidemia (Chronic)   Lab Results  Component Value Date   CHOL 140 11/13/2022   HDL 27 (L) 11/13/2022   LDLCALC 70 11/13/2022   LDLDIRECT 53 01/26/2022   TRIG 263 (H) 11/13/2022   CHOLHDL 5.2 (H) 11/13/2022   LDL at goal on crestor.  Relevant Orders   Lipid panel (Completed)   Type 2 diabetes mellitus with diabetic polyneuropathy, with long-term current use of insulin (HCC)   Lab Results  Component Value Date   HGBA1C 6.5 (A) 05/04/2023   HGBA1C 7.9 (H) 02/20/2023   HGBA1C 7.8 (H) 11/17/2022   Lab Results  Component Value Date   MICROALBUR <0.7 01/17/2023   LDLCALC 70 11/13/2022   CREATININE 1.53 (H) 02/21/2023   Much better A1C on Ozempic, farxiga, basaglar 16 units.       Relevant Orders   Basic Metabolic Panel (BMET) (Completed)   Urine Microalbumin w/creat. ratio (Completed)   OSA (obstructive sleep apnea)   Good compliance with cpap.       History of CVA (cerebrovascular accident)   Continue coumadin/statin sugar and bp control for secondary prevention.      Aneurysm of ascending aorta without rupture (HCC) - Primary   Unchanged 10/24 at 4.3 cm.  Plan to repeat in 1 year.       I am having [name]. Harriger maintain his Blood Glucose Monitoring Suppl, betamethasone dipropionate, AMBULATORY NON FORMULARY MEDICATION, Dexcom G6 Sensor, OneTouch Verio, OneTouch Delica Lancets 33G, tacrolimus, colchicine, NovoLOG FlexPen,  PRESCRIPTION MEDICATION, ketoconazole, aspirin EC, furosemide, dapagliflozin propanediol, rosuvastatin, fenofibrate, metoprolol succinate, warfarin, Semaglutide (2 MG/DOSE), isosorbide mononitrate, Basaglar KwikPen, BD Pen Needle Nano 2nd Gen, and Shingrix.  No orders of the defined types were placed in this encounter.

## 2023-05-17 ENCOUNTER — Encounter: Payer: Self-pay | Admitting: Family

## 2023-05-17 LAB — LIPID PANEL
Cholesterol: 112 mg/dL (ref 0–200)
HDL: 30.8 mg/dL — ABNORMAL LOW (ref >39.00)
LDL Cholesterol: 16 mg/dL (ref 0–99)
NonHDL: 80.86
Total CHOL/HDL Ratio: 4
Triglycerides: 322 mg/dL — ABNORMAL HIGH (ref 0.0–149.0)
VLDL: 64.4 mg/dL — ABNORMAL HIGH (ref 0.0–40.0)

## 2023-05-17 LAB — BASIC METABOLIC PANEL WITH GFR
BUN: 31 mg/dL — ABNORMAL HIGH (ref 6–23)
CO2: 29 meq/L (ref 19–32)
Calcium: 9 mg/dL (ref 8.4–10.5)
Chloride: 100 meq/L (ref 96–112)
Creatinine, Ser: 1.47 mg/dL (ref 0.40–1.50)
GFR: 45.52 mL/min — ABNORMAL LOW (ref >60.00)
Glucose, Bld: 129 mg/dL — ABNORMAL HIGH (ref 70–99)
Potassium: 3.3 meq/L — ABNORMAL LOW (ref 3.5–5.1)
Sodium: 140 meq/L (ref 135–145)

## 2023-05-17 LAB — MICROALBUMIN / CREATININE URINE RATIO
Creatinine,U: 63.1 mg/dL
Microalb Creat Ratio: UNDETERMINED mg/g (ref 0.0–30.0)
Microalb, Ur: <0.7 mg/dL

## 2023-05-17 NOTE — Patient Instructions (Signed)
 VISIT SUMMARY:  You are a 78 year old male with diabetes and an abdominal aortic aneurysm who came in for a routine follow-up and medication management. You have experienced significant weight loss due to the use of Ozempic, and your A1c levels have improved. Your abdominal aortic aneurysm remains stable, and your cholesterol and blood pressure are well-controlled. You are compliant with your CPAP machine for sleep apnea and are managing your anticoagulation therapy with warfarin. You have a history of gout but have not had any recent flares.  YOUR PLAN:  -TYPE 2 DIABETES MELLITUS: Your diabetes is well-controlled with the weight loss from increased Ozempic. Continue taking Ozempic 2 mg weekly and monitor your blood glucose levels regularly. [facility] with [facility] for diabetes management.  -ABDOMINAL AORTIC ANEURYSM: Your abdominal aortic aneurysm is stable at 4.3 cm, which is below the threshold for surgery. Continue with annual monitoring of the aneurysm size.  -ATRIAL FIBRILLATION: You are on long-term warfarin therapy for atrial fibrillation, and your INR is 2.6. Continue taking warfarin and monitor your INR regularly. Learn about dietary management to keep your INR stable.  -HYPERTENSION: Your blood pressure is well-controlled with metoprolol and a diuretic. Continue taking metoprolol XL 25 mg daily and the diuretic as prescribed.  -HYPERLIPIDEMIA: Your LDL cholesterol is at 70 mg/dL, which is within the target range. Continue taking Crestor 40 mg and try to adhere to the medication regimen. We will recheck your cholesterol levels today.  -OBSTRUCTIVE SLEEP APNEA: Your sleep apnea is well-managed with the CPAP machine. Continue using the CPAP as prescribed.  -GOUT: You have not had any recent gout flares but do not have colchicine on hand. We will prescribe colchicine for you to use if you experience a gout flare.  -GENERAL HEALTH MAINTENANCE: Continue with your weight loss and healthy  eating. Make sure to adhere to your medication regimen.  INSTRUCTIONS:  You have surgery scheduled for July 8th and will need surgical clearance. Please schedule a surgical clearance appointment within 30 days of your surgery.

## 2023-05-17 NOTE — Assessment & Plan Note (Signed)
 Continue coumadin/statin sugar and bp control for secondary prevention.

## 2023-05-21 ENCOUNTER — Encounter: Payer: Self-pay | Admitting: Family

## 2023-05-22 MED ORDER — SEMAGLUTIDE (2 MG/DOSE) 8 MG/3ML ~~LOC~~ SOPN: 2.0000 mg | PEN_INJECTOR | SUBCUTANEOUS | 1 refills | Status: DC

## 2023-05-23 ENCOUNTER — Ambulatory Visit

## 2023-05-23 ENCOUNTER — Ambulatory Visit: Payer: Medicare HMO | Admitting: Family

## 2023-05-23 DIAGNOSIS — R339 Retention of urine, unspecified: Secondary | ICD-10-CM | POA: Diagnosis not present

## 2023-05-23 DIAGNOSIS — E1139 Type 2 diabetes mellitus with other diabetic ophthalmic complication: Secondary | ICD-10-CM | POA: Diagnosis not present

## 2023-05-23 DIAGNOSIS — N2581 Secondary hyperparathyroidism of renal origin: Secondary | ICD-10-CM | POA: Diagnosis not present

## 2023-05-23 DIAGNOSIS — I130 Hypertensive heart and chronic kidney disease with heart failure and stage 1 through stage 4 chronic kidney disease, or unspecified chronic kidney disease: Secondary | ICD-10-CM | POA: Diagnosis not present

## 2023-05-23 DIAGNOSIS — D631 Anemia in chronic kidney disease: Secondary | ICD-10-CM | POA: Diagnosis not present

## 2023-05-23 DIAGNOSIS — E1129 Type 2 diabetes mellitus with other diabetic kidney complication: Secondary | ICD-10-CM | POA: Diagnosis not present

## 2023-05-23 DIAGNOSIS — N1832 Chronic kidney disease, stage 3b: Secondary | ICD-10-CM | POA: Diagnosis not present

## 2023-05-23 DIAGNOSIS — I10 Essential (primary) hypertension: Secondary | ICD-10-CM | POA: Diagnosis not present

## 2023-05-24 ENCOUNTER — Ambulatory Visit: Attending: Cardiology | Admitting: *Deleted

## 2023-05-24 DIAGNOSIS — Z952 Presence of prosthetic heart valve: Secondary | ICD-10-CM

## 2023-05-24 DIAGNOSIS — Z5181 Encounter for therapeutic drug level monitoring: Secondary | ICD-10-CM

## 2023-05-24 LAB — POCT INR: POC INR: 3.8

## 2023-05-24 NOTE — Patient Instructions (Addendum)
 Description   Eat an Extra serving of greens today.  Then continue taking warfarin 1 tablet daily except 1/2 tablet on Mondays, Wednesdays, and Fridays. Remain consistent with leafy green (increase intake to 4 servings/week).  Recheck INR in 3 weeks.  Coumadin  Clinic [phone].      1st Floor: - Lobby - Registration  - Pharmacy  - Lab - Cafe  2nd Floor: - PV Lab - Diagnostic Testing (echo, CT, nuclear med)  3rd Floor: - Vacant  4th Floor: - TCTS (cardiothoracic surgery) - AFib Clinic - Structural Heart Clinic - Vascular Surgery  - Vascular Ultrasound  5th Floor: - [facility] (general and EP) - Clinical Pharmacy for coumadin , hypertension, lipid, weight-loss medications, and med management appointments    Valet parking services will be available as well.

## 2023-05-28 ENCOUNTER — Other Ambulatory Visit: Payer: Self-pay | Admitting: Family

## 2023-06-04 NOTE — Progress Notes (Unsigned)
 Cardiology Clinic Note   Patient Name: [name] Date of Encounter: 06/06/2023  Primary Care Provider:  Dorrene Gaucher, NP Primary Cardiologist:  None  Patient Profile    [name] 78 year old male presents the clinic today for follow-up evaluation of his coronary artery disease and essential hypertension.  Past Medical History    Past Medical History:  Diagnosis Date   Actinic keratosis 02/14/2011   Allergy    Aneurysm of ascending aorta without rupture (HCC) 01/06/2022   Aortic stenosis    Arthritis    Bilateral sensorineural hearing loss 06/16/2009   CAD (coronary artery disease)    cabg   Complication of anesthesia    Coronary atherosclerosis of native coronary artery 06/15/2009   Diabetic polyneuropathy 11/11/2018   Dyslipidemia 11/05/2018   Embolic stroke involving left middle cerebral artery  05/30/2021   Erectile dysfunction 05/18/2009   Essential hypertension 06/16/2009   Expressive aphasia 06/25/2021   External hemorrhoids 03/21/2010   Gout 04/09/2015   Heart murmur    Hemochromatosis 02/14/2011   History of aortic valve replacement 10/13/2009   History of CABG 11/05/2018   2005- 1. Coronary artery bypass grafting x4 with LIMA-LAD, SVG-OM1, SVG-AM and dCFX.  2. Aortic valve replacement with St Jude AVR 3. Septal myomectomy.  4. Myoview low risk 2016   History of hepatitis 01/31/1972   Hypertriglyceridemia 06/13/2010   Hypertrophic obstructive cardiomyopathy 05/06/2008   SP septal myomectomy at surgery 2005 Echo Nov 2018- Hyperdynamic LVEF with severe basal septal hypertrophy. There is chordal SAM with resting gradient of 16 mmHg that increases to 85 mmHg with Valsalva        Leg weakness, bilateral 01/18/2021   Long term (current) use of anticoagulants 05/22/2018   Lumbar stenosis with neurogenic claudication 02/04/2021   Malignant neoplasm of prostate 06/17/2009   Mechanical heart valve present    Manufacturer: [employer]ock #:  [id number]  Model #: [id number]. Card states MRI compatible with 3 teslas or less.   Obesity, unspecified 04/29/2008   OSA (obstructive sleep apnea) 07/12/2012   moderate-severe; uses CPAP nightly   PONV (postoperative nausea and vomiting)    after CABG- slow to wake up   Presence of prosthetic heart valve    Primary osteoarthritis of left shoulder 10/31/2018   S/P lumbar laminectomy 02/04/2021   Stage 3 chronic kidney disease due to type 2 diabetes mellitus 11/11/2018   Tobacco use 06/16/2009   Type 2 diabetes mellitus with hyperglycemia    Ventral hernia 03/03/2014   Past Surgical History:  Procedure Laterality Date   AORTIC VALVE REPLACEMENT  11/14/2003   [facility]   APPENDECTOMY  1990   CHOLECYSTECTOMY  1990   CORONARY ANGIOGRAPHY  10/11/2021   CORONARY ARTERY BYPASS GRAFT  10/2003   HERNIA REPAIR  1999   right, inguinal   HERNIA REPAIR  2002   left, inguinal   HIP SURGERY  2006   right hip   IR CT HEAD LTD  02/03/2022   IR PERCUTANEOUS ART THROMBECTOMY/INFUSION INTRACRANIAL INC DIAG ANGIO  01/24/2022   LUMBAR LAMINECTOMY/DECOMPRESSION MICRODISCECTOMY Bilateral 02/04/2021   Procedure: Bilateral Lumbar Two-Three Laminectomy;  Surgeon: Elna Haggis, MD;  Location: [facility] OR;  Service: Neurosurgery;  Laterality: Bilateral;  3C/RM 20   PILONIDAL CYST EXCISION  1964   prostate seed implant  03/2010   RADIOLOGY WITH ANESTHESIA N/A 01/24/2022   Procedure: IR WITH ANESTHESIA;  Surgeon: Radiologist, Medication, MD;  Location: [facility] OR;  Service: Radiology;  Laterality: N/A;   TEE WITHOUT CARDIOVERSION N/A 03/14/2022   Procedure: TRANSESOPHAGEAL ECHOCARDIOGRAM (TEE);  Surgeon: Euell Herrlich, MD;  Location: [facility] ENDOSCOPY;  Service: Cardiology;  Laterality: N/A;   TONSILLECTOMY  childhood    Allergies  No Known Allergies  History of Present Illness    [name] has a PMH of HTN, CAD, hypertrophic obstructive cardiomyopathy, embolic CVA 5/23, OSA, diabetic neuropathy, CKD  stage III, type 2 diabetes, bilateral sensorineural hearing loss, HLD, obesity, tobacco use, ED, ventral hernia, bilateral lower extremity weakness, lumbar laminectomy, expressive aphasia, and aortic valve replacement 2005.  He is status post CABG (LIMA-LAD, SVG-marginal, sequential SVG-RCA-distal left circumflex 2005 with septal myomectomy.  His abdominal CT 4/15 showed no aneurysm.  Nuclear stress test 5/16 showed normal perfusion and normal LV function.  Echocardiogram 9/22 showed normal LV function, previous septal myomectomy with no significant gradient, G2 DD, previous AVR with mean gradient of 10 mmHg and dilated ascending aorta measuring 43 mm.  He followed up with Dr. Audery Blazing 2/23.  At that time he was doing well.  He had been admitted to the hospital 06/24/2021 with expressive aphasia.  His INR on arrival was 2.7.  CTA showed no intracranial large vessel occlusion, moderate-severe narrowing of the right cavernous carotid, moderate narrowing of the right supraclinoid carotid.  CTA of the neck showed no significant stenosis.  MRI showed left MCA patchy small infarcts.  His echocardiogram during admission showed an EF greater than 75%, normal functioning aortic valve prosthesis.  Stroke team felt his CVA was embolic despite his therapeutic INR.  Aspirin  was added to his medication regimen.  His INR target was changed to 2.5-3.5.  At discharge his INR was 1.9.  He was discharged with overlapping Lovenox  bridging.  He was seen in follow-up by Ervin Heath PA-C on 07/12/2021.  During that time he was noted to noted to have right or left-sided weakness.  He continued with pressured speech.  He was working with speech therapy.  He denied recent chest pain, worsening shortness of breath.  Follow-up was planned for 3 months.  He presented to the clinic 9/23 for follow-up evaluation stated he was admitted to the hospital in the [place]  with NSTEMI on 10/11/2021.  His aspirin  was discontinued he was started on  Plavix  and Imdur .  His cardiac troponins peaked around 10,000.  Cardiac catheterization showed chronic total occlusion of mid LAD, chronic total occlusion of mid RCA, obstructive narrowing of the first diagonal artery, mid OM1 and ostial mid OM 2.  His LIMA-LAD bypass graft was 80% stenosed at the very distal LAD.  His left circumflex/RCA vein graft-OM was occluded.  Case was discussed with Dr. Audery Blazing.  His Coumadin  was resumed.  He was taken off of aspirin  and placed on Plavix .  He was also started on Imdur .  He has had no further chest discomfort since being discharged from the hospital.  He was tolerating his Imdur  well.  He was beginning to resume his physical activity.  His left radial cath site was healing well.  He did have some left wrist ecchymosis and right lower abdomen ecchymosis from Lovenox  injection.  I continued his current medication regimen, kept follow-up with Dr. Audery Blazing in 1 month, and asked him to increase his physical activity as tolerated.  CTA 10/23 showed 4.3 cm aortic aneurysm, 7 mm right lower lobe pulmonary nodule.  Follow-up TEE 2/24 showed normal LV function mild left atrial enlargement, mild right atrial enlargement, mild mitral valve regurgitation, status post  aortic valve replacement with trace aortic insufficiency and mean gradient of 10.4 mmHg, dilated ascending aorta measuring 45 mm and moderate aortic plaque.  He had recurrent CVA 3/24.  His INR was noted to be 2.2.  Brain MRI showed tiny cortical infarct of the left frontal operculum.  Carotid Dopplers 3/24 showed 1-39% bilateral stenosis.  Echocardiogram 3/24 showed EF of 60-65%, trace mitral regurgitation status post AVR with gradients not measured.  Goal INR increased to 3-3.5.  He continue to follow-up with cardiology regularly.  He was last seen in clinic by Dr. Audery Blazing on 11/01/2022.  During that time he denied dyspnea on exertion, orthopnea, PND, chest pain and syncope.  He was noted to have chronic mild pedal  edema.  CT angio chest aorta was ordered.  CT angio chest aorta 11/06/2022 showed stable aortic aneurysm measuring 4.3 cm.  He was also noted to have a stable 5 mm right lower lobe pulmonary nodule.  He presents to the clinic today for follow-up evaluation and states he has been doing well since March last year.  We reviewed his CVA history.  We also reviewed his cardiac history.  His blood pressure is well-controlled today at 128/60.  He is tolerating his medications well.  We reviewed these.  He expressed understanding.  He was noted to have low potassium on his last BMP.  He notes that he previously has had trouble with low potassium.  He notes that he has been using his CPAP regularly.  He has also been going to [employer] regularly.  He enjoys doing steps and using resistance bands.  I will repeat his BMP and plan follow-up in 6 months..  Today the denies chest pain, shortness of breath, lower extremity edema, fatigue, palpitations, melena, hematuria, hemoptysis, diaphoresis, weakness, presyncope, syncope, orthopnea, and PND.    Home Medications    Prior to Admission medications   Medication Sig Start Date End Date Taking? Authorizing Provider  AMBULATORY NON FORMULARY MEDICATION cpap cushions            AirFit F20 (Size: Large)           AirFit F30 (Size: Med)   Please FAX the prescription to:  [phone] 02/10/20   Dorrene Gaucher, NP  aspirin  EC 81 MG tablet Take 1 tablet (81 mg total) by mouth daily. Swallow whole. 07/15/21   Meng, Hao, PA  betamethasone  dipropionate (DIPROLENE ) 0.05 % cream Apply topically 2 (two) times daily as needed. To eczema rash 09/06/18   Dorrene Gaucher, NP  Blood Glucose Monitoring Suppl Supplies MISC Use for monitoring glucose level 08/05/18   Dorrene Gaucher, NP  colchicine  0.6 MG tablet TAKE 2 TABS BY MOUTH NOW AND THEN 1 TAB IN 1 HOUR FOR GOUT. (MAX 3 TABS/24 HRS) 12/07/20   Dorrene Gaucher, NP  Continuous Blood Gluc Sensor (DEXCOM G6  SENSOR) MISC 1 Device by Does not apply route as directed. 05/18/20   Shamleffer, Ibtehal Jaralla, MD  Continuous Blood Gluc Transmit (DEXCOM G6 TRANSMITTER) MISC 1 Device by Does not apply route as directed. 05/18/20   Shamleffer, Ibtehal Jaralla, MD  dapagliflozin  propanediol (FARXIGA ) 10 MG TABS tablet Take 1 tablet (10 mg total) by mouth daily before breakfast. 04/26/21   Shamleffer, Julian Obey, MD  enoxaparin  (LOVENOX ) 100 MG/ML injection Inject 1 mL (100 mg total) into the skin every 12 (twelve) hours. 10/17/21   Lenise Quince, MD  fenofibrate  (TRICOR ) 145 MG tablet TAKE 1 TABLET BY MOUTH EVERY DAY 07/11/21   O'Sullivan, [name],  NP  furosemide  (LASIX ) 20 MG tablet TAKE 1 TABLET BY MOUTH EVERY DAY AS NEEDED FOR SWELLING 07/11/21   Dorrene Gaucher, NP  glucose blood (ONETOUCH VERIO) test strip Use as instructed 09/15/20   O'Sullivan, [name], NP  insulin  aspart (NOVOLOG  FLEXPEN) 100 UNIT/ML FlexPen Max daily 70 units Patient taking differently: Using sliding scale, pts spouse unsure of dosage 04/26/21   Shamleffer, Ibtehal Jaralla, MD  Insulin  Glargine (BASAGLAR  KWIKPEN) 100 UNIT/ML INJECT 38 UNITS INTO THE SKIN DAILY. 09/26/21   Shamleffer, Ibtehal Jaralla, MD  Insulin  Pen Needle 32G X 4 MM MISC 1 Device by Does not apply route in the morning, at noon, in the evening, and at bedtime. 04/26/21   Shamleffer, Ibtehal Jaralla, MD  metoprolol  succinate (TOPROL -XL) 25 MG 24 hr tablet TAKE 1 TABLET BY MOUTH EVERY DAY 06/29/21   Dorrene Gaucher, NP  OneTouch Delica Lancets 33G MISC USE AS DIRECTED 09/15/20   O'Sullivan, [name], NP  rosuvastatin  (CRESTOR ) 40 MG tablet Take 1 tablet (40 mg total) by mouth daily. Patient taking differently: Take 40 mg by mouth every evening. 12/06/20 07/26/21  Lenise Quince, MD  tacrolimus  (PROTOPIC ) 0.1 % ointment Apply 1 application  topically daily as needed (irritation). 11/29/20   [provider]  warfarin (COUMADIN ) 7.5 MG tablet TAKE 1/2 A TABLET  TO 1 TABLET BY MOUTH DAILY AS DIRECTED BY THE COUMADIN  CLINIC 09/26/21   Lenise Quince, MD    Family History    Family History  Problem Relation Age of Onset   Heart disease Mother    Stroke Mother    Heart disease Father    Asperger's syndrome Son    Hyperlipidemia Son    Coronary artery disease Brother    Cancer Neg Hx        negative for colon cancer   He indicated that his mother is deceased. He indicated that his father is deceased. He indicated that the status of his brother is unknown. He indicated that his daughter is alive. He indicated that his son is alive. He indicated that the status of his neg hx is unknown.  Social History    Social History   Socioeconomic History   Marital status: Married    Spouse name: Not on file   Number of children: Not on file   Years of education: 18   Highest education level: Master's degree (e.g., MA, MS, MEng, MEd, MSW, MBA)  Occupational History   Occupation: Retired  Tobacco Use   Smoking status: Some Days    Types: Cigars    Last attempt to quit: 06/2021    Years since quitting: 1.9   Smokeless tobacco: Never   Tobacco comments:    Pt states he smokes 1 cigar about once a month. 06/01/2021    Pt quit  Cigars 06/2021  Substance and Sexual Activity   Alcohol use: Not Currently    Comment: wine maybe 1 per month   Drug use: No   Sexual activity: Never  Other Topics Concern   Not on file  Social History Narrative   ** Merged History Encounter ** Right Handed    Lives in a one sotyr home. One step leads to front     Lives with wife   Retired   Occasionally caffeine   Social Drivers of Corporate investment banker Strain: Low Risk  (01/13/2023)   Overall Financial Resource Strain (CARDIA)    Difficulty of Paying Living Expenses: Not very hard  Food Insecurity: No  Food Insecurity (01/13/2023)   Hunger Vital Sign    Worried About Running Out of Food in the Last Year: Never true    Ran Out of Food in the Last Year:  Never true  Transportation Needs: No Transportation Needs (01/13/2023)   PRAPARE - Administrator, Civil Service (Medical): No    Lack of Transportation (Non-Medical): No  Physical Activity: Insufficiently Active (01/13/2023)   Exercise Vital Sign    Days of Exercise per Week: 3 days    Minutes of Exercise per Session: 30 min  Stress: No Stress Concern Present (01/13/2023)   [employer] of Occupational Health - Occupational Stress Questionnaire    Feeling of Stress : Not at all  Social Connections: Moderately Isolated (01/13/2023)   Social Connection and Isolation Panel [NHANES]    Frequency of Communication with Friends and Family: Once a week    Frequency of Social Gatherings with Friends and Family: Once a week    Attends Religious Services: More than 4 times per year    Active Member of [employer] or Organizations: No    Attends Banker Meetings: Never    Marital Status: Married  Catering manager Violence: Not At Risk (08/02/2022)   Humiliation, Afraid, Rape, and Kick questionnaire    Fear of Current or Ex-Partner: No    Emotionally Abused: No    Physically Abused: No    Sexually Abused: No     Review of Systems    General:  No chills, fever, night sweats or weight changes.  Cardiovascular:  No chest pain, dyspnea on exertion, edema, orthopnea, palpitations, paroxysmal nocturnal dyspnea. Dermatological: No rash, lesions/masses Respiratory: No cough, dyspnea Urologic: No hematuria, dysuria Abdominal:   No nausea, vomiting, diarrhea, bright red blood per rectum, melena, or hematemesis Neurologic:  No visual changes, wkns, changes in mental status. All other systems reviewed and are otherwise negative except as noted above.  Physical Exam    VS:  BP 128/60 (BP Location: Left Arm, Patient Position: Sitting, Cuff Size: Large)   Pulse 70   Ht 5\' 9"  (1.753 m)   Wt 213 lb (96.6 kg)   SpO2 95%   BMI 31.45 kg/m  , BMI Body mass index is 31.45  kg/m. GEN: Well nourished, well developed, in no acute distress. HEENT: normal. Neck: Supple, no JVD, carotid bruits, or masses. Cardiac: RRR, systolic click heard along left sternal border, no murmurs, rubs, or gallops. No clubbing, cyanosis, edema.  Radials/DP/PT 2+ and equal bilaterally.  Respiratory:  Respirations regular and unlabored, clear to auscultation bilaterally. GI: Soft, nontender, nondistended, BS + x 4. MS: no deformity or atrophy. Skin: warm and dry, no rash. Neuro:  Strength and sensation are intact. Psych: Normal affect.  Accessory Clinical Findings    Recent Labs: 08/02/2022: NT-Pro BNP 184 01/17/2023: ALT 19; Hemoglobin 14.2; Platelets 245.0 05/16/2023: BUN 31; Creatinine, Ser 1.47; Potassium 3.3; Sodium 140   Recent Lipid Panel    Component Value Date/Time   CHOL 112 05/16/2023 1528   CHOL 140 11/13/2022 0833   TRIG 322.0 (H) 05/16/2023 1528   HDL 30.80 (L) 05/16/2023 1528   HDL 27 (L) 11/13/2022 0833   CHOLHDL 4 05/16/2023 1528   VLDL 64.4 (H) 05/16/2023 1528   LDLCALC 16 05/16/2023 1528   LDLCALC 70 11/13/2022 0833   LDLCALC 99 09/22/2019 1119   LDLDIRECT 53 01/26/2022 0305         ECG personally reviewed by me today-none today.  Echocardiogram 06/25/2021  1. No apical thrombus with Definity  contrast. Left ventricular ejection  fraction, by estimation, is >75%. The left ventricle has hyperdynamic  function. The left ventricle has no regional wall motion abnormalities.  There is moderate left ventricular  hypertrophy of the basal-septal segment. Left ventricular diastolic  parameters are consistent with Grade I diastolic dysfunction (impaired  relaxation). Elevated left ventricular end-diastolic pressure. The E/e' is  15.   2. Right ventricular systolic function is normal. The right ventricular  size is normal.   3. The mitral valve is grossly normal. Trivial mitral valve  regurgitation.   4. The aortic valve was not well visualized. Aortic  valve regurgitation  is trivial. There is a 26 mm St. Jude mechanical valve present in the  aortic position. Procedure Date: 2005. Echo findings are consistent with  normal structure and function of the  aortic valve prosthesis. Aortic valve mean gradient measures 11.0 mmHg.   5. Aortic dilatation noted. There is mild dilatation of the ascending  aorta, measuring 42 mm.   Comparison(s): Changes from prior study are noted. 10/11/2020: LVEF 60-65%,  St. Jude aortic valve with 9.8 mmHg gradient, dilated aorta to 43 mm.   CT angio chest aorta 11/06/2022  FINDINGS: Cardiovascular: There is adequate opacification of the thoracic aorta. Aneurysmal dilatation of the ascending aorta measuring 4.3 cm is unchanged. There is no evidence for dissection. The heart is mildly enlarged. There is no pericardial effusion. Aortic valve replacement is again seen. There are atherosclerotic calcifications of the aorta.  Mediastinum/Nodes: No enlarged mediastinal, hilar, or axillary lymph nodes. Thyroid gland, trachea, and esophagus demonstrate no significant findings.  Lungs/Pleura: Lungs are clear. No pleural effusion or pneumothorax. There is a stable 5 mm right lower lobe nodule image 302/64.  Upper Abdomen: No acute abnormality. Cholecystectomy clips are present. Left renal cyst is partially imaged measuring 17 mm.  Musculoskeletal: No chest wall abnormality. No acute or significant osseous findings. Sternotomy wires are present.  Review of the MIP images confirms the above findings.  IMPRESSION: 1. Stable ascending thoracic aortic aneurysm measuring 4.3 cm. Recommend annual imaging followup by CTA or MRA. This recommendation follows 2010 ACCF/AHA/AATS/ACR/ASA/SCA/SCAI/SIR/STS/SVM Guidelines for the Diagnosis and Management of Patients with Thoracic Aortic Disease. Circulation. 2010; 121: U981-X914. Aortic aneurysm NOS (ICD10-I71.9) 2. Stable 5 mm right lower lobe pulmonary nodule. No  follow-up needed if patient is low-risk.This recommendation follows the consensus statement: Guidelines for Management of Incidental Pulmonary Nodules Detected on CT Images: From the Fleischner Society 2017; Radiology 2017; 284:228-243. 3. Aortic atherosclerosis.  Aortic Atherosclerosis (ICD10-I70.0).   Electronically Signed By: Tyron Gallon M.D. On: 11/16/2022 23:06   Assessment & Plan   1.  Coronary artery disease-denies anginal type symptoms.  Status post CABG. Cardiac catheterization on 10/11/2021 in [place] .  Was noted to have LIMA-LAD 80% narrowing at the distal LAD.  Medical management was decided on.  His last 1 was discontinued, started Plavix , and Imdur  was started. Continue aspirin , metoprolol , rosuvastatin , fenofibrate , Imdur  Heart healthy low-sodium diet Maintain physical activity Order BMP  Thoracic aortic aneurysm-denies recent chest and back discomfort.  Follow-up CTA showed stable 4.3 cm ascending aortic aneurysm. Plan for repeat CT angio chest aorta 10/25  Mechanical AVR-post CVA his INR goal is moved to 3-3.5.  Clinical pharmacist is providing Lovenox  bridging.  Repeat INR 10/19/2021 therapeutic.  Systolic click heard along left sternal border. Continue aspirin , Coumadin  Follows with INR clinic  History of CVA-no musculoskeletal deficits.  Speech continues to be somewhat pressured.  Continues to work  with speech therapy. Continue Coumadin , aspirin  Continue speech therapy  Essential hypertension-BP today 128/60 Continue metoprolol  Heart healthy low-sodium diet-salty 6 given Increase physical activity as tolerated  Hyperlipidemia-LDL16 on 05/16/23 Continue aspirin , rosuvastatin , fenofibrate  Heart healthy low-sodium high-fiber diet Increase physical activity as tolerated  Disposition: Follow-up with Dr. Audery Blazing in 6 months  Chet Cota. Tellis Spivak NP-C     06/06/2023, 4:38 PM [facility] [address] Office  [phone] Fax [phone]  Notice: This dictation was prepared with Dragon dictation along with smaller phrase technology. Any transcriptional errors that result from this process are unintentional and may not be corrected upon review.  I spent 15 minutes examining this patient, reviewing medications, and using patient centered shared decision making involving her cardiac care.  Prior to her visit I spent greater than 20 minutes reviewing her past medical history,  medications, and prior cardiac tests.

## 2023-06-06 ENCOUNTER — Encounter: Payer: Self-pay | Admitting: General Practice

## 2023-06-06 ENCOUNTER — Ambulatory Visit: Attending: General Practice | Admitting: General Practice

## 2023-06-06 VITALS — BP 128/60 | HR 70 | Ht 69.0 in | Wt 213.0 lb

## 2023-06-06 DIAGNOSIS — E785 Hyperlipidemia, unspecified: Secondary | ICD-10-CM

## 2023-06-06 DIAGNOSIS — I7121 Aneurysm of the ascending aorta, without rupture: Secondary | ICD-10-CM

## 2023-06-06 DIAGNOSIS — I10 Essential (primary) hypertension: Secondary | ICD-10-CM | POA: Diagnosis not present

## 2023-06-06 DIAGNOSIS — Z952 Presence of prosthetic heart valve: Secondary | ICD-10-CM | POA: Diagnosis not present

## 2023-06-06 DIAGNOSIS — I25810 Atherosclerosis of coronary artery bypass graft(s) without angina pectoris: Secondary | ICD-10-CM | POA: Diagnosis not present

## 2023-06-06 DIAGNOSIS — Z8673 Personal history of transient ischemic attack (TIA), and cerebral infarction without residual deficits: Secondary | ICD-10-CM

## 2023-06-06 NOTE — Patient Instructions (Signed)
 Medication Instructions:  Your physician recommends that you continue on your current medications as directed. Please refer to the Current Medication list given to you today.  *If you need a refill on your cardiac medications before your next appointment, please call your pharmacy*  Lab Work: To be completed today: BMP  If you have labs (blood work) drawn today and your tests are completely normal, you will receive your results only by: MyChart Message (if you have MyChart) OR A paper copy in the mail If you have any lab test that is abnormal or we need to change your treatment, we will call you to review the results.  Testing/Procedures: None ordered today.  Follow-Up: At [facility], you and your health needs are our priority.  As part of our continuing mission to provide you with exceptional heart care, our providers are all part of one team.  This team includes your primary Cardiologist (physician) and Advanced Practice Providers or APPs (Physician Assistants and Nurse Practitioners) who all work together to provide you with the care you need, when you need it.  Your next appointment:   6 month(s)  The format for your next appointment:   In Person  Provider:   Dr. Audery Blazing  {

## 2023-06-07 LAB — BASIC METABOLIC PANEL WITH GFR
BUN/Creatinine Ratio: 29 — ABNORMAL HIGH (ref 10–24)
BUN: 37 mg/dL — ABNORMAL HIGH (ref 8–27)
CO2: 22 mmol/L (ref 20–29)
Calcium: 9.6 mg/dL (ref 8.6–10.2)
Chloride: 98 mmol/L (ref 96–106)
Creatinine, Ser: 1.26 mg/dL (ref 0.76–1.27)
Glucose: 184 mg/dL — ABNORMAL HIGH (ref 70–99)
Potassium: 4.1 mmol/L (ref 3.5–5.2)
Sodium: 140 mmol/L (ref 134–144)
eGFR: 58 mL/min/{1.73_m2} — ABNORMAL LOW (ref >59)

## 2023-06-11 ENCOUNTER — Ambulatory Visit: Attending: Cardiology

## 2023-06-11 DIAGNOSIS — Z952 Presence of prosthetic heart valve: Secondary | ICD-10-CM

## 2023-06-11 DIAGNOSIS — Z7901 Long term (current) use of anticoagulants: Secondary | ICD-10-CM

## 2023-06-11 LAB — POCT INR: INR: 2.9 (ref 2.0–3.0)

## 2023-06-11 NOTE — Patient Instructions (Signed)
 Take 1 tablet today only then  continue taking warfarin 1 tablet daily except 1/2 tablet on Mondays, Wednesdays, and Fridays. Remain consistent with leafy green (increase intake to 4 servings/week).  Recheck INR in 4 weeks.  Coumadin  Clinic [phone].  [facility] Fax # [phone]

## 2023-06-12 ENCOUNTER — Ambulatory Visit: Payer: Self-pay

## 2023-06-14 DIAGNOSIS — M25612 Stiffness of left shoulder, not elsewhere classified: Secondary | ICD-10-CM | POA: Diagnosis not present

## 2023-06-14 DIAGNOSIS — M6281 Muscle weakness (generalized): Secondary | ICD-10-CM | POA: Diagnosis not present

## 2023-06-14 DIAGNOSIS — M19012 Primary osteoarthritis, left shoulder: Secondary | ICD-10-CM | POA: Diagnosis not present

## 2023-06-17 ENCOUNTER — Encounter: Payer: Self-pay | Admitting: Internal Medicine

## 2023-06-18 NOTE — Telephone Encounter (Signed)
 Patient dropped off patient assistance paperwork.  This was placed in the providers in box at the front desk.  Company is - [employer]

## 2023-06-19 ENCOUNTER — Other Ambulatory Visit: Payer: Self-pay | Admitting: Internal Medicine

## 2023-06-19 DIAGNOSIS — E118 Type 2 diabetes mellitus with unspecified complications: Secondary | ICD-10-CM

## 2023-06-20 ENCOUNTER — Encounter: Payer: Self-pay | Admitting: Family

## 2023-06-20 DIAGNOSIS — G4733 Obstructive sleep apnea (adult) (pediatric): Secondary | ICD-10-CM | POA: Diagnosis not present

## 2023-06-20 DIAGNOSIS — I10 Essential (primary) hypertension: Secondary | ICD-10-CM | POA: Diagnosis not present

## 2023-06-21 ENCOUNTER — Other Ambulatory Visit: Payer: Self-pay

## 2023-06-21 MED ORDER — FARXIGA 10 MG PO TABS: 10.0000 mg | ORAL_TABLET | Freq: Every day | ORAL | 3 refills | Status: DC

## 2023-06-21 MED ORDER — FARXIGA 10 MG PO TABS: 10.0000 mg | ORAL_TABLET | Freq: Every day | ORAL | 0 refills | Status: DC

## 2023-06-26 ENCOUNTER — Telehealth: Payer: Self-pay

## 2023-06-26 NOTE — Telephone Encounter (Signed)
 Farxiga  application was denied due to exceeding the income limit.

## 2023-07-11 ENCOUNTER — Ambulatory Visit: Attending: Cardiology | Admitting: *Deleted

## 2023-07-11 DIAGNOSIS — Z7901 Long term (current) use of anticoagulants: Secondary | ICD-10-CM | POA: Diagnosis not present

## 2023-07-11 DIAGNOSIS — Z952 Presence of prosthetic heart valve: Secondary | ICD-10-CM | POA: Diagnosis not present

## 2023-07-11 LAB — POCT INR: INR: 3.6 — AB (ref 2.0–3.0)

## 2023-07-11 NOTE — Patient Instructions (Addendum)
 Description   Tomorrow take 1/2 tablet of warfarin then continue taking warfarin 1 tablet daily except 1/2 tablet on Mondays, Wednesdays, and Fridays. Remain consistent with leafy green (increase intake to 4 servings/week).  Recheck INR in 2 weeks.  Coumadin  Clinic [phone].  [facility] Fax # [phone]

## 2023-07-12 ENCOUNTER — Telehealth: Payer: Self-pay | Admitting: Cardiology

## 2023-07-12 ENCOUNTER — Other Ambulatory Visit: Payer: Self-pay | Admitting: Family

## 2023-07-12 DIAGNOSIS — I152 Hypertension secondary to endocrine disorders: Secondary | ICD-10-CM

## 2023-07-12 NOTE — Telephone Encounter (Signed)
 Follow Up::     Patient surgery is 08-07-23. She says he will need the Levenox Bridging before his surgery .

## 2023-07-12 NOTE — Telephone Encounter (Signed)
   Pre-operative Risk Assessment    Patient Name: [name]  DOB: 02/18/1945 MRN: [id number]   Date of last office visit: 06/06/23 Lawana Pray, NP Date of next office visit: NONE   Request for Surgical Clearance    Procedure:  LEFT REVERSE TOTAL SHOULDER ARTHROPLASTY  Date of Surgery:  Clearance 08/07/23                                Surgeon:  Osa Blase, MD Surgeon's Group or Practice Name:  [facility] [place] Phone number:  [phone]  EXT [phone] Fax number:  [phone]   ATTN: Mark Sil   Type of Clearance Requested:   - Medical  - Pharmacy:  Hold Aspirin  and Warfarin (Coumadin ) (LOVENOX ?)   Type of Anesthesia:  General    Additional requests/questions:    SignedCollin Deal   07/12/2023, 12:31 PM

## 2023-07-12 NOTE — Telephone Encounter (Signed)
 Called and spoke with surgeons office and requested a formal Preop clearance request. Surgeons office stated they would send it right over.

## 2023-07-16 DIAGNOSIS — G4733 Obstructive sleep apnea (adult) (pediatric): Secondary | ICD-10-CM | POA: Diagnosis not present

## 2023-07-16 DIAGNOSIS — I10 Essential (primary) hypertension: Secondary | ICD-10-CM | POA: Diagnosis not present

## 2023-07-16 NOTE — Telephone Encounter (Signed)
   Name: [name]  DOB: 1945-02-19  MRN: [id number]   Primary Cardiologist: None  Chart reviewed as part of pre-operative protocol coverage. [name] was last seen on 06/06/2023 by Lawana Pray, NP.  Per Aleta Anda, he may proceed with planned surgery without further cardiac testing.  Therefore, based on ACC/AHA guidelines, the patient would be an acceptable risk for the planned procedure without further cardiovascular testing.   Per Pharm D, patient may hold warfarin for 5 days prior to the procedure. The patient will require bridging with Lovenox  (enoxaparin ) around the time of the procedure.Will route to the Coumadin  Clinic to coordinate Lovenox  bridging.    I will route this recommendation to the requesting party via Epic fax function and remove from pre-op pool. Please call with questions.  Morey Ar, NP 07/16/2023, 5:02 PM

## 2023-07-16 NOTE — Telephone Encounter (Signed)
 Patient with diagnosis of mechanical aortic valve and stroke on warfarin for anticoagulation.     Procedure: LEFT REVERSE TOTAL SHOULDER ARTHROPLASTY  Date of procedure: 08/07/23  CrCl 55 mL/min  Platelet count 245   Per office protocol, the patient can hold warfarin for 5 days prior to the procedure. The patient will require bridging with Lovenox  (enoxaparin ) around the time of the procedure. Will route to the Coumadin  Clinic to coordinate Lovenox  bridging    **This guidance is not considered finalized until pre-operative APP has relayed final recommendations.**

## 2023-07-16 NOTE — Telephone Encounter (Signed)
 [name],  You saw this patient on 06/06/2023. Per office protocol, will you please comment on medical clearance for left reverse total shoulder arthroplasty on 08/07/2023?  Please route your response to [facility] Preop. I will communicate with requesting office once you have given recommendations.   Thank you!  Morey Ar, NP

## 2023-07-17 ENCOUNTER — Other Ambulatory Visit: Payer: Self-pay

## 2023-07-17 ENCOUNTER — Ambulatory Visit: Admitting: Family

## 2023-07-17 ENCOUNTER — Telehealth: Payer: Self-pay

## 2023-07-17 MED ORDER — FARXIGA 10 MG PO TABS: 10.0000 mg | ORAL_TABLET | Freq: Every day | ORAL | 1 refills | Status: DC

## 2023-07-17 MED ORDER — FARXIGA 10 MG PO TABS: 10.0000 mg | ORAL_TABLET | Freq: Every day | ORAL | 3 refills | Status: DC

## 2023-07-17 NOTE — Telephone Encounter (Signed)
 Spoke with patient and confirmed that the AZME application was denied and sent a 90 days supply of the Farxiga  to [facility] per patient request

## 2023-07-18 ENCOUNTER — Ambulatory Visit (HOSPITAL_BASED_OUTPATIENT_CLINIC_OR_DEPARTMENT_OTHER)
Admission: RE | Admit: 2023-07-18 | Discharge: 2023-07-18 | Disposition: A | Discharge status: Home / Self Care | Source: Ambulatory Visit | Attending: Family | Admitting: Family

## 2023-07-18 ENCOUNTER — Ambulatory Visit (INDEPENDENT_AMBULATORY_CARE_PROVIDER_SITE_OTHER): Admitting: Family

## 2023-07-18 VITALS — BP 113/56 | HR 65 | Temp 97.8°F | Resp 16 | Ht 69.0 in | Wt 205.0 lb

## 2023-07-18 DIAGNOSIS — Z01818 Encounter for other preprocedural examination: Secondary | ICD-10-CM | POA: Diagnosis not present

## 2023-07-18 DIAGNOSIS — I10 Essential (primary) hypertension: Secondary | ICD-10-CM

## 2023-07-18 DIAGNOSIS — G4733 Obstructive sleep apnea (adult) (pediatric): Secondary | ICD-10-CM

## 2023-07-18 DIAGNOSIS — Z794 Long term (current) use of insulin: Secondary | ICD-10-CM | POA: Diagnosis not present

## 2023-07-18 DIAGNOSIS — I700 Atherosclerosis of aorta: Secondary | ICD-10-CM | POA: Diagnosis not present

## 2023-07-18 DIAGNOSIS — E1122 Type 2 diabetes mellitus with diabetic chronic kidney disease: Secondary | ICD-10-CM

## 2023-07-18 DIAGNOSIS — N1831 Chronic kidney disease, stage 3a: Secondary | ICD-10-CM | POA: Diagnosis not present

## 2023-07-18 DIAGNOSIS — E785 Hyperlipidemia, unspecified: Secondary | ICD-10-CM | POA: Diagnosis not present

## 2023-07-18 DIAGNOSIS — I771 Stricture of artery: Secondary | ICD-10-CM | POA: Diagnosis not present

## 2023-07-18 LAB — CBC WITH DIFFERENTIAL/PLATELET
Basophils Absolute: 0.1 10*3/uL (ref 0.0–0.1)
Basophils Relative: 0.8 % (ref 0.0–3.0)
Eosinophils Absolute: 0.1 10*3/uL (ref 0.0–0.7)
Eosinophils Relative: 1.7 % (ref 0.0–5.0)
HCT: 42.7 % (ref 39.0–52.0)
Hemoglobin: 14.3 g/dL (ref 13.0–17.0)
Lymphocytes Relative: 15.7 % (ref 12.0–46.0)
Lymphs Abs: 1.2 10*3/uL (ref 0.7–4.0)
MCHC: 33.5 g/dL (ref 30.0–36.0)
MCV: 86.8 fl (ref 78.0–100.0)
Monocytes Absolute: 0.6 10*3/uL (ref 0.1–1.0)
Monocytes Relative: 7.7 % (ref 3.0–12.0)
Neutro Abs: 5.6 10*3/uL (ref 1.4–7.7)
Neutrophils Relative %: 74.1 % (ref 43.0–77.0)
Platelets: 258 10*3/uL (ref 150.0–400.0)
RBC: 4.92 Mil/uL (ref 4.22–5.81)
RDW: 15.3 % (ref 11.5–15.5)
WBC: 7.5 10*3/uL (ref 4.0–10.5)

## 2023-07-18 LAB — COMPREHENSIVE METABOLIC PANEL WITH GFR
ALT: 15 U/L (ref 0–53)
AST: 23 U/L (ref 0–37)
Albumin: 4.5 g/dL (ref 3.5–5.2)
Alkaline Phosphatase: 41 U/L (ref 39–117)
BUN: 32 mg/dL — ABNORMAL HIGH (ref 6–23)
CO2: 31 meq/L (ref 19–32)
Calcium: 9.3 mg/dL (ref 8.4–10.5)
Chloride: 101 meq/L (ref 96–112)
Creatinine, Ser: 1.43 mg/dL (ref 0.40–1.50)
GFR: 46.99 mL/min — ABNORMAL LOW (ref >60.00)
Glucose, Bld: 120 mg/dL — ABNORMAL HIGH (ref 70–99)
Potassium: 3.6 meq/L (ref 3.5–5.1)
Sodium: 140 meq/L (ref 135–145)
Total Bilirubin: 0.5 mg/dL (ref 0.2–1.2)
Total Protein: 7.7 g/dL (ref 6.0–8.3)

## 2023-07-18 NOTE — Assessment & Plan Note (Signed)
 Lab Results  Component Value Date   HGBA1C 6.5 (A) 05/04/2023   HGBA1C 7.9 (H) 02/20/2023   HGBA1C 7.8 (H) 11/17/2022   Lab Results  Component Value Date   MICROALBUR <0.7 05/16/2023   LDLCALC 16 05/16/2023   CREATININE 1.26 06/06/2023   A1C at goal, continues to follow with Endocrinology.

## 2023-07-18 NOTE — Assessment & Plan Note (Signed)
 BP Readings from Last 3 Encounters:  07/18/23 (!) 113/56  06/06/23 128/60  05/16/23 (!) 104/54   BP stable on metoprolol .

## 2023-07-18 NOTE — Assessment & Plan Note (Signed)
 Lab Results  Component Value Date   CHOL 112 05/16/2023   HDL 30.80 (L) 05/16/2023   LDLCALC 16 05/16/2023   LDLDIRECT 53 01/26/2022   TRIG 322.0 (H) 05/16/2023   CHOLHDL 4 05/16/2023   LDL at goal.  Continues crestor , fenofibrate .

## 2023-07-18 NOTE — Patient Instructions (Signed)
 VISIT SUMMARY:  You came in today for preoperative clearance for your upcoming left shoulder surgery. Your blood sugar control has improved, and we discussed your current medications and overall health maintenance.  YOUR PLAN:  PREOPERATIVE CLEARANCE FOR LEFT SHOULDER SURGERY: You need clearance for your upcoming surgery, and we need to manage your medications and overall health before the procedure. -Perform an EKG and order a chest x-ray. -Coordinate with your cardiologist for surgical clearance. -Contact the Coumadin  clinic to manage your anticoagulation therapy.  TYPE 2 DIABETES MELLITUS: Your blood sugar control has improved with your current medication, Ozempic . -Continue taking Ozempic  as prescribed. -Recheck your A1c level in three months.  HYPERTRIGLYCERIDEMIA: Your triglyceride levels are elevated. -Make dietary changes to reduce starches and concentrated sugars.  OBSTRUCTIVE SLEEP APNEA: You are using your CPAP machine as recommended. -Continue using your CPAP machine every night.  GENERAL HEALTH MAINTENANCE: You are due for a shingles vaccination. -Get your shingles vaccination.  FOLLOW-UP: We discussed plans for your preoperative clearance and diabetes management. -Schedule a lab appointment for preoperative tests. -Follow up with your cardiologist for surgical clearance. -Coordinate with the Coumadin  clinic for anticoagulation management. -Discuss your son's diabetes management and medication adjustments on July 1st.

## 2023-07-18 NOTE — Progress Notes (Signed)
 Subjective:     Patient ID: [name], male    DOB: Oct 23, 1945, 78 y.o.   MRN: [id number]  Chief Complaint  Patient presents with   Pre-op Exam    Scheduled for left shoulder surgery 08/07/23    HPI  Discussed the use of AI scribe software for clinical note transcription with the patient, who gave verbal consent to proceed.  History of Present Illness  [name] is a 78 year old male with diabetes and atrial fibrillation who presents for preoperative clearance for left shoulder surgery. Plan is for a total shoulder replacement.   His A1c level has improved from 7.9 to 6.5, and he continues on Ozempic . He is on Coumadin  for atrial fibrillation/mechanical heart valve  and attends a Coumadin  clinic for management. A previous plan involving Coumadin  and Lovenox  from January requires updating. He is not currently taking metoprolol . He uses a CPAP machine daily for sleep apnea. No significant swelling is noted, and his shoe size has decreased due to reduced foot swelling.     Health Maintenance Due  Topic Date Due   DTaP/Tdap/Td (3 - Td or Tdap) 03/01/2022   COVID-19 Vaccine (5 - 2024-25 season) 10/01/2022   Zoster Vaccines- Shingrix (2 of 2) 05/01/2023   Medicare Annual Wellness (AWV)  08/02/2023    Past Medical History:  Diagnosis Date   Actinic keratosis 02/14/2011   Allergy    Aneurysm of ascending aorta without rupture (HCC) 01/06/2022   Aortic stenosis    Arthritis    Bilateral sensorineural hearing loss 06/16/2009   CAD (coronary artery disease)    cabg   Complication of anesthesia    Coronary atherosclerosis of native coronary artery 06/15/2009   Diabetic polyneuropathy 11/11/2018   Dyslipidemia 11/05/2018   Embolic stroke involving left middle cerebral artery  05/30/2021   Erectile dysfunction 05/18/2009   Essential hypertension 06/16/2009   Expressive aphasia 06/25/2021   External hemorrhoids 03/21/2010   Gout 04/09/2015   Heart murmur     Hemochromatosis 02/14/2011   History of aortic valve replacement 10/13/2009   History of CABG 11/05/2018   2005- 1. Coronary artery bypass grafting x4 with LIMA-LAD, SVG-OM1, SVG-AM and dCFX.  2. Aortic valve replacement with St Jude AVR 3. Septal myomectomy.  4. Myoview low risk 2016   History of hepatitis 01/31/1972   Hypertriglyceridemia 06/13/2010   Hypertrophic obstructive cardiomyopathy 05/06/2008   SP septal myomectomy at surgery 2005 Echo Nov 2018- Hyperdynamic LVEF with severe basal septal hypertrophy. There is chordal SAM with resting gradient of 16 mmHg that increases to 85 mmHg with Valsalva        Leg weakness, bilateral 01/18/2021   Long term (current) use of anticoagulants 05/22/2018   Lumbar stenosis with neurogenic claudication 02/04/2021   Malignant neoplasm of prostate 06/17/2009   Mechanical heart valve present    Manufacturer: [employer] #: [id number]  Model #: [id number]. Card states MRI compatible with 3 teslas or less.   Obesity, unspecified 04/29/2008   OSA (obstructive sleep apnea) 07/12/2012   moderate-severe; uses CPAP nightly   PONV (postoperative nausea and vomiting)    after CABG- slow to wake up   Presence of prosthetic heart valve    Primary osteoarthritis of left shoulder 10/31/2018   S/P lumbar laminectomy 02/04/2021   Stage 3 chronic kidney disease due to type 2 diabetes mellitus 11/11/2018   Tobacco use 06/16/2009   Type 2 diabetes mellitus with hyperglycemia    Ventral hernia 03/03/2014  Past Surgical History:  Procedure Laterality Date   AORTIC VALVE REPLACEMENT  11/14/2003   [employer] [facility]   APPENDECTOMY  1990   CHOLECYSTECTOMY  1990   CORONARY ANGIOGRAPHY  10/11/2021   CORONARY ARTERY BYPASS GRAFT  10/2003   HERNIA REPAIR  1999   right, inguinal   HERNIA REPAIR  2002   left, inguinal   HIP SURGERY  2006   right hip   IR CT HEAD LTD  02/03/2022   IR PERCUTANEOUS ART THROMBECTOMY/INFUSION INTRACRANIAL INC DIAG ANGIO  01/24/2022    LUMBAR LAMINECTOMY/DECOMPRESSION MICRODISCECTOMY Bilateral 02/04/2021   Procedure: Bilateral Lumbar Two-Three Laminectomy;  Surgeon: Elna Haggis, MD;  Location: [facility] OR;  Service: Neurosurgery;  Laterality: Bilateral;  3C/RM 20   PILONIDAL CYST EXCISION  1964   prostate seed implant  03/2010   RADIOLOGY WITH ANESTHESIA N/A 01/24/2022   Procedure: IR WITH ANESTHESIA;  Surgeon: Radiologist, Medication, MD;  Location: [facility] OR;  Service: Radiology;  Laterality: N/A;   TEE WITHOUT CARDIOVERSION N/A 03/14/2022   Procedure: TRANSESOPHAGEAL ECHOCARDIOGRAM (TEE);  Surgeon: Euell Herrlich, MD;  Location: [facility] ENDOSCOPY;  Service: Cardiology;  Laterality: N/A;   TONSILLECTOMY  childhood    Family History  Problem Relation Age of Onset   Heart disease Mother    Stroke Mother    Heart disease Father    Asperger's syndrome Son    Hyperlipidemia Son    Coronary artery disease Brother    Cancer Neg Hx        negative for colon cancer    Social History   Socioeconomic History   Marital status: Married    Spouse name: Not on file   Number of children: Not on file   Years of education: 18   Highest education level: Master's degree (e.g., MA, MS, MEng, MEd, MSW, MBA)  Occupational History   Occupation: Retired  Tobacco Use   Smoking status: Some Days    Types: Cigars    Last attempt to quit: 06/2021    Years since quitting: 2.0   Smokeless tobacco: Never   Tobacco comments:    Pt states he smokes 1 cigar about once a month. 06/01/2021    Pt quit  Cigars 06/2021  Substance and Sexual Activity   Alcohol use: Not Currently    Comment: wine maybe 1 per month   Drug use: No   Sexual activity: Never  Other Topics Concern   Not on file  Social History Narrative   ** Merged History Encounter ** Right Handed    Lives in a one sotyr home. One step leads to front     Lives with wife   Retired   Occasionally caffeine   Social Drivers of Health   Financial Resource Strain: Low Risk  (07/13/2023)    Overall Financial Resource Strain (CARDIA)    Difficulty of Paying Living Expenses: Not very hard  Food Insecurity: Food Insecurity Present (07/13/2023)   Hunger Vital Sign    Worried About Running Out of Food in the Last Year: Sometimes true    Ran Out of Food in the Last Year: Never true  Transportation Needs: No Transportation Needs (07/13/2023)   PRAPARE - Administrator, Civil Service (Medical): No    Lack of Transportation (Non-Medical): No  Physical Activity: Insufficiently Active (07/13/2023)   Exercise Vital Sign    Days of Exercise per Week: 3 days    Minutes of Exercise per Session: 20 min  Stress: No Stress Concern Present (  07/13/2023)   [place] [employer] - Occupational Stress Questionnaire    Feeling of Stress: Not at all  Social Connections: Moderately Integrated (07/13/2023)   Social Connection and Isolation Panel    Frequency of Communication with Friends and Family: Twice a week    Frequency of Social Gatherings with Friends and Family: Once a week    Attends Religious Services: More than 4 times per year    Active Member of [employer] or Organizations: No    Attends Banker Meetings: Not on file    Marital Status: Married  Catering manager Violence: Not At Risk (08/02/2022)   Humiliation, Afraid, Rape, and Kick questionnaire    Fear of Current or Ex-Partner: No    Emotionally Abused: No    Physically Abused: No    Sexually Abused: No    Outpatient Medications Prior to Visit  Medication Sig Dispense Refill   AMBULATORY NON FORMULARY MEDICATION cpap cushions            AirFit F20 (Size: Large)           AirFit F30 (Size: Med)   Please FAX the prescription to:  [phone] each prn   aspirin  EC 81 MG tablet Take 1 tablet (81 mg total) by mouth daily. Swallow whole. 30 tablet 12   BD PEN NEEDLE NANO 2ND GEN 32G X 4 MM MISC 1 DEVICE BY DOES NOT APPLY ROUTE IN THE MORNING, AT NOON, IN THE EVENING, AND AT BEDTIME. 400  each 3   betamethasone  dipropionate (DIPROLENE ) 0.05 % cream Apply topically 2 (two) times daily as needed. To eczema rash (Patient taking differently: Apply 1 application  topically 2 (two) times daily as needed (to eczema/rash).) 30 g 1   Blood Glucose Monitoring Suppl Supplies MISC Use for monitoring glucose level 100 each 1   colchicine  0.6 MG tablet TAKE 1 TABLET (0.6 MG TOTAL) BY MOUTH 2 (TWO) TIMES DAILY AS NEEDED. 180 tablet 0   Continuous Blood Gluc Sensor (DEXCOM G6 SENSOR) MISC 1 Device by Does not apply route as directed. 9 each 3   FARXIGA  10 MG TABS tablet Take 1 tablet (10 mg total) by mouth daily before breakfast. 90 tablet 3   fenofibrate  (TRICOR ) 145 MG tablet TAKE 1 TABLET BY MOUTH EVERY DAY 90 tablet 1   furosemide  (LASIX ) 40 MG tablet Take 1 tablet (40 mg total) by mouth daily. 90 tablet 3   glucose blood (ONETOUCH VERIO) test strip Use as instructed 100 strip 12   insulin  aspart (NOVOLOG  FLEXPEN) 100 UNIT/ML FlexPen Max daily 75 units (Patient taking differently: Inject 14-19 Units into the skin See admin instructions. Inject 16 units into the skin with breakfast, 14 units with lunch, and 16 units with supper/evening meal- may increase to up to 19 units per dose as needed for high blood sugar) 75 mL 3   Insulin  Glargine (BASAGLAR  KWIKPEN) 100 UNIT/ML INJECT 22 UNITS INTO THE SKIN DAILY. 45 mL 0   isosorbide  mononitrate (IMDUR ) 30 MG 24 hr tablet TAKE 1 TABLET BY MOUTH EVERY DAY 90 tablet 1   ketoconazole (NIZORAL) 2 % cream Apply 1 Application topically daily as needed for irritation.     metoprolol  succinate (TOPROL -XL) 25 MG 24 hr tablet TAKE 1 TABLET BY MOUTH EVERY DAY 90 tablet 1   OneTouch Delica Lancets 33G MISC USE AS DIRECTED 100 each 1   PRESCRIPTION MEDICATION CPAP- At bedtime     rosuvastatin  (CRESTOR ) 40 MG tablet TAKE 1 TABLET  BY MOUTH EVERY DAY 90 tablet 3   Semaglutide , 2 MG/DOSE, 8 MG/3ML SOPN Inject 2 mg as directed once a week. 6 mL 1   SHINGRIX injection       tacrolimus  (PROTOPIC ) 0.1 % ointment Apply 1 application  topically 2 (two) times daily as needed (for eczema).     warfarin (COUMADIN ) 7.5 MG tablet TAKE 1/2 TABLET TO 1 TABLET BY MOUTH DAILY AS DIRECTED BY THE COUMADIN  CLINIC 85 tablet 0   No facility-administered medications prior to visit.    No Known Allergies  ROS See HPI    Objective:    Physical Exam Constitutional:      General: He is not in acute distress.    Appearance: He is well-developed.  HENT:     Head: Normocephalic and atraumatic.   Cardiovascular:     Rate and Rhythm: Normal rate and regular rhythm.     Heart sounds: No murmur heard. Pulmonary:     Effort: Pulmonary effort is normal. No respiratory distress.     Breath sounds: Normal breath sounds. No wheezing or rales.   Musculoskeletal:     Right lower leg: 2+ Edema present.     Left lower leg: 2+ Edema present.   Skin:    General: Skin is warm and dry.   Neurological:     Mental Status: He is alert and oriented to person, place, and time.   Psychiatric:        Behavior: Behavior normal.        Thought Content: Thought content normal.      BP (!) 113/56 (BP Location: Right Arm, Patient Position: Sitting)   Pulse 65   Temp 97.8 F (36.6 C) (Oral)   Resp 16   Ht 5' 9 (1.753 m)   Wt 205 lb (93 kg)   SpO2 99%   BMI 30.27 kg/m  Wt Readings from Last 3 Encounters:  07/18/23 205 lb (93 kg)  06/06/23 213 lb (96.6 kg)  05/16/23 204 lb (92.5 kg)       Assessment & Plan:   Problem List Items Addressed This Visit       Unprioritized   Essential hypertension (Chronic)   BP Readings from Last 3 Encounters:  07/18/23 (!) 113/56  06/06/23 128/60  05/16/23 (!) 104/54   BP stable on metoprolol .       Relevant Orders   Comp Met (CMET) (Completed)   Dyslipidemia (Chronic)   Lab Results  Component Value Date   CHOL 112 05/16/2023   HDL 30.80 (L) 05/16/2023   LDLCALC 16 05/16/2023   LDLDIRECT 53 01/26/2022   TRIG 322.0 (H)  05/16/2023   CHOLHDL 4 05/16/2023   LDL at goal.  Continues crestor , fenofibrate .        Type 2 diabetes mellitus with stage 3a chronic kidney disease, with long-term current use of insulin  (HCC)   Lab Results  Component Value Date   HGBA1C 6.5 (A) 05/04/2023   HGBA1C 7.9 (H) 02/20/2023   HGBA1C 7.8 (H) 11/17/2022   Lab Results  Component Value Date   MICROALBUR <0.7 05/16/2023   LDLCALC 16 05/16/2023   CREATININE 1.26 06/06/2023   A1C at goal, continues to follow with Endocrinology.       Pre-op evaluation - Primary   Labs as ordered as well as chest x-ray.  EKG tracing is personally reviewed.  EKG notes NSR.  No acute changes.  Medical clearance pending review of additional pending studies. Will need cardiac clearance for perioperative  anticoagulation recommendations in setting of mechanical valve.       Relevant Orders   DG Chest 2 View (Completed)   CBC w/Diff (Completed)   Urine Culture   EKG 12-Lead (Completed)   OSA (obstructive sleep apnea)   Continue CPAP       I am having [name] maintain his Blood Glucose Monitoring Suppl, betamethasone  dipropionate, AMBULATORY NON FORMULARY MEDICATION, Dexcom G6 Sensor, OneTouch Verio, OneTouch Delica Lancets 33G, tacrolimus , colchicine , NovoLOG  FlexPen, PRESCRIPTION MEDICATION, ketoconazole, aspirin  EC, furosemide , rosuvastatin , warfarin, isosorbide  mononitrate, BD Pen Needle Nano 2nd Gen, Shingrix, Semaglutide  (2 MG/DOSE), Basaglar  KwikPen, fenofibrate , metoprolol  succinate, and Farxiga .  No orders of the defined types were placed in this encounter.

## 2023-07-18 NOTE — Assessment & Plan Note (Signed)
 Continue CPAP.

## 2023-07-18 NOTE — Assessment & Plan Note (Signed)
 Labs as ordered as well as chest x-ray.  EKG tracing is personally reviewed.  EKG notes NSR.  No acute changes.  Medical clearance pending review of additional pending studies. Will need cardiac clearance for perioperative anticoagulation recommendations in setting of mechanical valve.

## 2023-07-19 ENCOUNTER — Ambulatory Visit: Payer: Self-pay | Admitting: Family

## 2023-07-19 DIAGNOSIS — E119 Type 2 diabetes mellitus without complications: Secondary | ICD-10-CM | POA: Diagnosis not present

## 2023-07-19 LAB — URINE CULTURE
MICRO NUMBER:: 16595887
SPECIMEN QUALITY:: ADEQUATE

## 2023-07-19 NOTE — Progress Notes (Signed)
 Sent message, via epic in basket, requesting orders in epic from Careers adviser.

## 2023-07-23 ENCOUNTER — Encounter: Payer: Self-pay | Admitting: Family

## 2023-07-24 NOTE — Progress Notes (Signed)
 Faxed to Dr. Josefina at [phone]

## 2023-07-24 NOTE — Progress Notes (Signed)
 Second request for pre op orders: Left voicemail for [employer].

## 2023-07-25 DIAGNOSIS — M19012 Primary osteoarthritis, left shoulder: Secondary | ICD-10-CM | POA: Diagnosis not present

## 2023-07-25 NOTE — Patient Instructions (Signed)
 SURGICAL WAITING ROOM VISITATION  Patients having surgery or a procedure may have no more than 2 support people in the waiting area - these visitors may rotate.    Children under the age of 17 must have an adult with them who is not the patient.  Visitors with respiratory illnesses are discouraged from visiting and should remain at home.  If the patient needs to stay at the hospital during part of their recovery, the visitor guidelines for inpatient rooms apply. Pre-op nurse will coordinate an appropriate time for 1 support person to accompany patient in pre-op.  This support person may not rotate.    Please refer to the [facility] website for the visitor guidelines for Inpatients (after your surgery is over and you are in a regular room).       Your procedure is scheduled on: 08/07/23   Report to [facility] Main Entrance    Report to admitting at 11:00 AM   Call this number if you have problems the morning of surgery [phone]   Do not eat food :After Midnight.   After Midnight you may have the following liquids until 10:30 AM DAY OF SURGERY  Water Non-Citrus Juices (without pulp, NO RED-Apple, White grape, White cranberry) Black Coffee (NO MILK/CREAM OR CREAMERS, sugar ok)  Clear Tea (NO MILK/CREAM OR CREAMERS, sugar ok) regular and decaf                             Plain Jell-O (NO RED)                                           Fruit ices (not with fruit pulp, NO RED)                                     Popsicles (NO RED)                                                               Sports drinks like Gatorade (NO RED)                 The day of surgery:  Drink ONE (1) Pre-Surgery G2 at 10:30 AM the morning of surgery. Drink in one sitting. Do not sip.  This drink was given to you during your hospital  pre-op appointment visit. Nothing else to drink after completing the  Pre-Surgery G2.          If you have questions, please contact your surgeon's  office.   FOLLOW BOWEL PREP AND ANY ADDITIONAL PRE OP INSTRUCTIONS YOU RECEIVED FROM YOUR SURGEON'S OFFICE!!!     Oral Hygiene is also important to reduce your risk of infection.                                    Remember - BRUSH YOUR TEETH THE MORNING OF SURGERY WITH YOUR REGULAR TOOTHPASTE  DENTURES WILL BE REMOVED PRIOR TO SURGERY PLEASE DO NOT APPLY Poly grip  OR ADHESIVES!!!   Stop all vitamins and herbal supplements 7 days before surgery.   Take these medicines the morning of surgery with A SIP OF WATER: Fenofibrate , Isosorbide , Metoprolol   DO NOT TAKE ANY ORAL DIABETIC MEDICATIONS DAY OF YOUR SURGERY  How to Manage Your Diabetes Before and After Surgery  Why is it important to control my blood sugar before and after surgery? Improving blood sugar levels before and after surgery helps healing and can limit problems. A way of improving blood sugar control is eating a healthy diet by:  Eating less sugar and carbohydrates  Increasing activity/exercise  Talking with your doctor about reaching your blood sugar goals High blood sugars (greater than 180 mg/dL) can raise your risk of infections and slow your recovery, so you will need to focus on controlling your diabetes during the weeks before surgery. Make sure that the doctor who takes care of your diabetes knows about your planned surgery including the date and location.  How do I manage my blood sugar before surgery? Check your blood sugar at least 4 times a day, starting 2 days before surgery, to make sure that the level is not too high or low. Check your blood sugar the morning of your surgery when you wake up and every 2 hours until you get to the Short Stay unit. If your blood sugar is less than 70 mg/dL, you will need to treat for low blood sugar: Do not take insulin . Treat a low blood sugar (less than 70 mg/dL) with  cup of clear juice (cranberry or apple), 4 glucose tablets, OR glucose gel. Recheck blood sugar in 15  minutes after treatment (to make sure it is greater than 70 mg/dL). If your blood sugar is not greater than 70 mg/dL on recheck, call [phone] for further instructions. Report your blood sugar to the short stay nurse when you get to Short Stay.  If you are admitted to the hospital after surgery: Your blood sugar will be checked by the staff and you will probably be given insulin  after surgery (instead of oral diabetes medicines) to make sure you have good blood sugar levels. The goal for blood sugar control after surgery is 80-180 mg/dL.   WHAT DO I DO ABOUT MY DIABETES MEDICATION?  Do not take oral diabetes medicines (pills) the morning of surgery.  Do not take Ozempic  after 07/30/23  Hold Farxiga  for 3 days, last dose 08/03/23.  THE DAY BEFORE SURGERY, take Novolog  as prescribed. Take 50% of Basaglar  at bedtime.     THE MORNING OF SURGERY, do not take Basaglar . Do not take Novolog  unless blood sugar is greater than 220, then take 50% of dose.  DO NOT TAKE THE FOLLOWING 7 DAYS PRIOR TO SURGERY: Ozempic , Wegovy , Rybelsus  (Semaglutide ), Byetta (exenatide), Bydureon (exenatide ER), Victoza, Saxenda (liraglutide), or Trulicity  (dulaglutide ) Mounjaro (Tirzepatide) Adlyxin (Lixisenatide), Polyethylene Glycol Loxenatide.  If your CBG is greater than 220 mg/dL, you may take  of your sliding scale  (correction) dose of insulin .     Reviewed and Endorsed by [facility] Patient Education Committee, August 2015  Bring CPAP mask and tubing day of surgery.                              You may not have any metal on your body including hair pins, jewelry, and body piercing             Do not wear lotions, powders,  cologne, or deodorant              Men may shave face and neck.   Do not bring valuables to the hospital. [facility] IS NOT             RESPONSIBLE   FOR VALUABLES.   Contacts, glasses, dentures or bridgework may not be worn into surgery.   Bring small overnight bag day of  surgery.   DO NOT BRING YOUR HOME MEDICATIONS TO THE HOSPITAL. PHARMACY WILL DISPENSE MEDICATIONS LISTED ON YOUR MEDICATION LIST TO YOU DURING YOUR ADMISSION IN THE HOSPITAL!    Patients discharged on the day of surgery will not be allowed to drive home.  Someone NEEDS to stay with you for the first 24 hours after anesthesia.              Please read over the following fact sheets you were given: IF YOU HAVE QUESTIONS ABOUT YOUR PRE-OP INSTRUCTIONS PLEASE CALL [phone] [place]   If you received a COVID test during your pre-op visit  it is requested that you wear a mask when out in public, stay away from anyone that may not be feeling well and notify your surgeon if you develop symptoms. If you test positive for Covid or have been in contact with anyone that has tested positive in the last 10 days please notify you surgeon.      Pre-operative 5 CHG Bath Instructions   You can play a key role in reducing the risk of infection after surgery. Your skin needs to be as free of germs as possible. You can reduce the number of germs on your skin by washing with CHG (chlorhexidine  gluconate) soap before surgery. CHG is an antiseptic soap that kills germs and continues to kill germs even after washing.   DO NOT use if you have an allergy to chlorhexidine /CHG or antibacterial soaps. If your skin becomes reddened or irritated, stop using the CHG and notify one of our RNs at [phone].   Please shower with the CHG soap starting 4 days before surgery using the following schedule:     Please keep in mind the following:  DO NOT shave, including legs and underarms, starting the day of your first shower.   You may shave your face at any point before/day of surgery.  Place clean sheets on your bed the day you start using CHG soap. Use a clean washcloth (not used since being washed) for each shower. DO NOT sleep with pets once you start using the CHG.   CHG Shower Instructions:  If you choose to  wash your hair and private area, wash first with your normal shampoo/soap.  After you use shampoo/soap, rinse your hair and body thoroughly to remove shampoo/soap residue.  Turn the water OFF and apply about 3 tablespoons (45 ml) of CHG soap to a CLEAN washcloth.  Apply CHG soap ONLY FROM YOUR NECK DOWN TO YOUR TOES (washing for 3-5 minutes)  DO NOT use CHG soap on face, private areas, open wounds, or sores.  Pay special attention to the area where your surgery is being performed.  If you are having back surgery, having someone wash your back for you may be helpful. Wait 2 minutes after CHG soap is applied, then you may rinse off the CHG soap.  Pat dry with a clean towel  Put on clean clothes/pajamas   If you choose to wear lotion, please use ONLY the CHG-compatible lotions on the back of this  paper.     Additional instructions for the day of surgery: DO NOT APPLY any lotions, deodorants, cologne, or perfumes.   Put on clean/comfortable clothes.  Brush your teeth.  Ask your nurse before applying any prescription medications to the skin.      CHG Compatible Lotions   Aveeno Moisturizing lotion  Cetaphil Moisturizing Cream  Cetaphil Moisturizing Lotion  Clairol Herbal Essence Moisturizing Lotion, Dry Skin  Clairol Herbal Essence Moisturizing Lotion, Extra Dry Skin  Clairol Herbal Essence Moisturizing Lotion, Normal Skin  Curel Age Defying Therapeutic Moisturizing Lotion with Alpha Hydroxy  Curel Extreme Care Body Lotion  Curel Soothing Hands Moisturizing Hand Lotion  Curel Therapeutic Moisturizing Cream, Fragrance-Free  Curel Therapeutic Moisturizing Lotion, Fragrance-Free  Curel Therapeutic Moisturizing Lotion, Original Formula  Eucerin Daily Replenishing Lotion  Eucerin Dry Skin Therapy Plus Alpha Hydroxy Crme  Eucerin Dry Skin Therapy Plus Alpha Hydroxy Lotion  Eucerin Original Crme  Eucerin Original Lotion  Eucerin Plus Crme Eucerin Plus Lotion  Eucerin TriLipid  Replenishing Lotion  Keri Anti-Bacterial Hand Lotion  Keri Deep Conditioning Original Lotion Dry Skin Formula Softly Scented  Keri Deep Conditioning Original Lotion, Fragrance Free Sensitive Skin Formula  Keri Lotion Fast Absorbing Fragrance Free Sensitive Skin Formula  Keri Lotion Fast Absorbing Softly Scented Dry Skin Formula  Keri Original Lotion  Keri Skin Renewal Lotion Keri Silky Smooth Lotion  Keri Silky Smooth Sensitive Skin Lotion  Nivea Body Creamy Conditioning Oil  Nivea Body Extra Enriched Lotion  Nivea Body Original Lotion  Nivea Body Sheer Moisturizing Lotion Nivea Crme  Nivea Skin Firming Lotion  NutraDerm 30 Skin Lotion  NutraDerm Skin Lotion  NutraDerm Therapeutic Skin Cream  NutraDerm Therapeutic Skin Lotion  ProShield Protective Hand Cream  Provon moisturizing lotion   [place]- Preparing for Total Shoulder Arthroplasty    Before surgery, you can play an important role. Because skin is not sterile, your skin needs to be as free of germs as possible. You can reduce the number of germs on your skin by using the following products. Benzoyl Peroxide Gel Reduces the number of germs present on the skin Applied twice a day to shoulder area starting two days before surgery    ==================================================================  Please follow these instructions carefully:  BENZOYL PEROXIDE 5% GEL  Please do not use if you have an allergy to benzoyl peroxide.   If your skin becomes reddened/irritated stop using the benzoyl peroxide.  Starting two days before surgery, apply as follows: Apply benzoyl peroxide in the morning and at night. Apply after taking a shower. If you are not taking a shower clean entire shoulder front, back, and side along with the armpit with a clean wet washcloth.  Place a quarter-sized dollop on your shoulder and rub in thoroughly, making sure to cover the front, back, and side of your shoulder, along with the armpit.   2  days before ____ AM   ____ PM              1 day before ____ AM   ____ PM                         Do this twice a day for two days.  (Last application is the night before surgery, AFTER using the CHG soap as described below).  Do NOT apply benzoyl peroxide gel on the day of surgery.  Incentive Spirometer  An incentive spirometer is a tool that can help keep your lungs clear  and active. This tool measures how well you are filling your lungs with each breath. Taking long deep breaths may help reverse or decrease the chance of developing breathing (pulmonary) problems (especially infection) following: A long period of time when you are unable to move or be active. BEFORE THE PROCEDURE  If the spirometer includes an indicator to show your best effort, your nurse or respiratory therapist will set it to a desired goal. If possible, sit up straight or lean slightly forward. Try not to slouch. Hold the incentive spirometer in an upright position. INSTRUCTIONS FOR USE  Sit on the edge of your bed if possible, or sit up as far as you can in bed or on a chair. Hold the incentive spirometer in an upright position. Breathe out normally. Place the mouthpiece in your mouth and seal your lips tightly around it. Breathe in slowly and as deeply as possible, raising the piston or the ball toward the top of the column. Hold your breath for 3-5 seconds or for as long as possible. Allow the piston or ball to fall to the bottom of the column. Remove the mouthpiece from your mouth and breathe out normally. Rest for a few seconds and repeat Steps 1 through 7 at least 10 times every 1-2 hours when you are awake. Take your time and take a few normal breaths between deep breaths. The spirometer may include an indicator to show your best effort. Use the indicator as a goal to work toward during each repetition. After each set of 10 deep breaths, practice coughing to be sure your lungs are clear. If you have an incision  (the cut made at the time of surgery), support your incision when coughing by placing a pillow or rolled up towels firmly against it. Once you are able to get out of bed, walk around indoors and cough well. You may stop using the incentive spirometer when instructed by your caregiver.  RISKS AND COMPLICATIONS Take your time so you do not get dizzy or light-headed. If you are in pain, you may need to take or ask for pain medication before doing incentive spirometry. It is harder to take a deep breath if you are having pain. AFTER USE Rest and breathe slowly and easily. It can be helpful to keep track of a log of your progress. Your caregiver can provide you with a simple table to help with this. If you are using the spirometer at home, follow these instructions: SEEK MEDICAL CARE IF:  You are having difficultly using the spirometer. You have trouble using the spirometer as often as instructed. Your pain medication is not giving enough relief while using the spirometer. You develop fever of 100.5 F (38.1 C) or higher. SEEK IMMEDIATE MEDICAL CARE IF:  You cough up bloody sputum that had not been present before. You develop fever of 102 F (38.9 C) or greater. You develop worsening pain at or near the incision site. MAKE SURE YOU:  Understand these instructions. Will watch your condition. Will get help right away if you are not doing well or get worse. Document Released: 05/29/2006 Document Revised: 04/10/2011 Document Reviewed: 07/30/2006 [facility] Patient Information 2014 [place], [place].   ________________________________________________________________________

## 2023-07-25 NOTE — Progress Notes (Signed)
 Sent message, via epic in basket, requesting orders in epic from Careers adviser.

## 2023-07-25 NOTE — Progress Notes (Signed)
 COVID Vaccine Completed: yes  Date of COVID positive in last 90 days:  PCP - Eleanor Ponto, NP Cardiologist - Redell Shallow, MD LOV 11/01/22  Cardiac clearance by Barnie Hila, NP 07/16/23 in Epic  Chest x-ray - 07/18/23 Epic EKG - 07/18/23 Epic Stress Test - 06/05/14 Epic ECHO - 04/10/22 Epic Cardiac Cath - 912/23 CEW Pacemaker/ICD device last checked: Spinal Cord Stimulator:  Bowel Prep -   Sleep Study -  CPAP -   Fasting Blood Sugar -  Checks Blood Sugar _____ times a day  Last dose of GLP1 agonist-  Semaglutide  GLP1 instructions:  Do not take after     Last dose of SGLT-2 inhibitors-  N/A SGLT-2 instructions:  Do not take after     Blood Thinner Instructions:  Warfarin Aspirin  Instructions: ASA 81, hold 5 days with Lovenox  bridging  Last Dose:  Activity level:  Can go up a flight of stairs and perform activities of daily living without stopping and without symptoms of chest pain or shortness of breath.  Able to exercise without symptoms  Unable to go up a flight of stairs without symptoms of     Anesthesia review: HTN, CAD, NSTEMI, a fib, ischemic stroke, OSA, DM2, CKD, CABG, heart murmur, mechanical heart valve, LBBB  Patient denies shortness of breath, fever, cough and chest pain at PAT appointment  Patient verbalized understanding of instructions that were given to them at the PAT appointment. Patient was also instructed that they will need to review over the PAT instructions again at home before surgery.

## 2023-07-26 ENCOUNTER — Ambulatory Visit: Attending: Cardiology | Admitting: *Deleted

## 2023-07-26 ENCOUNTER — Encounter (HOSPITAL_COMMUNITY): Payer: Self-pay

## 2023-07-26 ENCOUNTER — Encounter (HOSPITAL_COMMUNITY)
Admission: RE | Admit: 2023-07-26 | Discharge: 2023-07-26 | Disposition: A | Discharge status: Home / Self Care | Source: Ambulatory Visit | Attending: Orthopedic Surgery | Admitting: Orthopedic Surgery

## 2023-07-26 ENCOUNTER — Other Ambulatory Visit: Payer: Self-pay

## 2023-07-26 VITALS — BP 138/75 | HR 62 | Temp 97.7°F | Resp 16 | Ht 68.5 in | Wt 202.0 lb

## 2023-07-26 DIAGNOSIS — Z794 Long term (current) use of insulin: Secondary | ICD-10-CM | POA: Insufficient documentation

## 2023-07-26 DIAGNOSIS — G4733 Obstructive sleep apnea (adult) (pediatric): Secondary | ICD-10-CM | POA: Diagnosis not present

## 2023-07-26 DIAGNOSIS — I700 Atherosclerosis of aorta: Secondary | ICD-10-CM | POA: Diagnosis not present

## 2023-07-26 DIAGNOSIS — I480 Paroxysmal atrial fibrillation: Secondary | ICD-10-CM | POA: Diagnosis not present

## 2023-07-26 DIAGNOSIS — E1142 Type 2 diabetes mellitus with diabetic polyneuropathy: Secondary | ICD-10-CM | POA: Diagnosis not present

## 2023-07-26 DIAGNOSIS — I421 Obstructive hypertrophic cardiomyopathy: Secondary | ICD-10-CM | POA: Insufficient documentation

## 2023-07-26 DIAGNOSIS — Z7901 Long term (current) use of anticoagulants: Secondary | ICD-10-CM | POA: Insufficient documentation

## 2023-07-26 DIAGNOSIS — H903 Sensorineural hearing loss, bilateral: Secondary | ICD-10-CM | POA: Diagnosis not present

## 2023-07-26 DIAGNOSIS — Z8673 Personal history of transient ischemic attack (TIA), and cerebral infarction without residual deficits: Secondary | ICD-10-CM | POA: Insufficient documentation

## 2023-07-26 DIAGNOSIS — M19012 Primary osteoarthritis, left shoulder: Secondary | ICD-10-CM | POA: Diagnosis not present

## 2023-07-26 DIAGNOSIS — I129 Hypertensive chronic kidney disease with stage 1 through stage 4 chronic kidney disease, or unspecified chronic kidney disease: Secondary | ICD-10-CM | POA: Insufficient documentation

## 2023-07-26 DIAGNOSIS — Z8546 Personal history of malignant neoplasm of prostate: Secondary | ICD-10-CM | POA: Diagnosis not present

## 2023-07-26 DIAGNOSIS — Z7985 Long-term (current) use of injectable non-insulin antidiabetic drugs: Secondary | ICD-10-CM | POA: Insufficient documentation

## 2023-07-26 DIAGNOSIS — Z01818 Encounter for other preprocedural examination: Secondary | ICD-10-CM

## 2023-07-26 DIAGNOSIS — Z87891 Personal history of nicotine dependence: Secondary | ICD-10-CM | POA: Diagnosis not present

## 2023-07-26 DIAGNOSIS — I7121 Aneurysm of the ascending aorta, without rupture: Secondary | ICD-10-CM | POA: Insufficient documentation

## 2023-07-26 DIAGNOSIS — I2510 Atherosclerotic heart disease of native coronary artery without angina pectoris: Secondary | ICD-10-CM | POA: Insufficient documentation

## 2023-07-26 DIAGNOSIS — I447 Left bundle-branch block, unspecified: Secondary | ICD-10-CM | POA: Diagnosis not present

## 2023-07-26 DIAGNOSIS — Z951 Presence of aortocoronary bypass graft: Secondary | ICD-10-CM | POA: Diagnosis not present

## 2023-07-26 DIAGNOSIS — Z952 Presence of prosthetic heart valve: Secondary | ICD-10-CM

## 2023-07-26 DIAGNOSIS — Z01812 Encounter for preprocedural laboratory examination: Secondary | ICD-10-CM | POA: Diagnosis present

## 2023-07-26 DIAGNOSIS — Z5181 Encounter for therapeutic drug level monitoring: Secondary | ICD-10-CM

## 2023-07-26 DIAGNOSIS — N183 Chronic kidney disease, stage 3 unspecified: Secondary | ICD-10-CM | POA: Diagnosis not present

## 2023-07-26 DIAGNOSIS — E1122 Type 2 diabetes mellitus with diabetic chronic kidney disease: Secondary | ICD-10-CM | POA: Insufficient documentation

## 2023-07-26 LAB — HEMOGLOBIN A1C
Hgb A1c MFr Bld: 6.8 % — ABNORMAL HIGH (ref 4.8–5.6)
Mean Plasma Glucose: 148.46 mg/dL

## 2023-07-26 LAB — POCT INR: POC INR: 3.6

## 2023-07-26 LAB — SURGICAL PCR SCREEN
MRSA, PCR: NEGATIVE
Staphylococcus aureus: POSITIVE — AB

## 2023-07-26 LAB — GLUCOSE, CAPILLARY: Glucose-Capillary: 135 mg/dL — ABNORMAL HIGH (ref 70–99)

## 2023-07-26 MED ORDER — ENOXAPARIN SODIUM 100 MG/ML IJ SOSY: 100.0000 mg | PREFILLED_SYRINGE | Freq: Two times a day (BID) | INTRAMUSCULAR | 1 refills | Status: DC

## 2023-07-26 NOTE — Progress Notes (Signed)
Please see anticoagulation encounter.

## 2023-07-26 NOTE — Patient Instructions (Addendum)
 7/2: Last dose of warfarin.  7/3: No warfarin or enoxaparin  (Lovenox ).  7/4: Inject enoxaparin100mg  in the fatty abdominal tissue at least 2 inches from the belly button twice a day about 12 hours apart, 8am and 8pm rotate sites. No warfarin.  7/5: Inject enoxaparin  in the fatty tissue every 12 hours, 8am and 8pm. No warfarin.  7/6: Inject enoxaparin  in the fatty tissue every 12 hours, 8am and 8pm. No warfarin.  7/7: Inject enoxaparin  in the fatty tissue in the morning at 8 am (No PM dose). No warfarin.  7/8: Procedure Day - No enoxaparin  - Resume warfarin in the evening or as directed by doctor (take an extra half tablet with usual dose for 2 days then resume normal dose).  7/9: Resume enoxaparin  inject in the fatty tissue every 12 hours and take warfarin  7/10: Inject enoxaparin  in the fatty tissue every 12 hours and take warfarin  7/11: Inject enoxaparin  in the fatty tissue every 12 hours and take warfarin  7/12: Inject enoxaparin  in the fatty tissue every 12 hours and take warfarin  7/13: Inject enoxaparin  in the fatty tissue every 12 hours and take warfarin  7/14: warfarin appt to check INR.   Description   Continue taking warfarin 1 tablet daily except 1/2 tablet on Mondays, Wednesdays, and Fridays. Eat an extra serving of greens today. Remain consistent with leafy green (increase intake to 4 servings/week).  Recheck INR 1 week post procedure Please follow bridge instructions Coumadin  Clinic [phone].  Clearance Fax # [phone]

## 2023-07-27 NOTE — Progress Notes (Signed)
[facility] results routed to Dr. Landau  

## 2023-07-30 ENCOUNTER — Encounter (HOSPITAL_COMMUNITY): Payer: Self-pay

## 2023-07-30 NOTE — Progress Notes (Addendum)
 Case: [id number] Date/Time: 08/07/23 1230   Procedure: ARTHROPLASTY, SHOULDER, TOTAL, REVERSE (Left: Shoulder)   Anesthesia type: General   Diagnosis: Primary osteoarthritis of left shoulder [M19.012]   Pre-op diagnosis: Primary osteoarthritis of left shoulder   Location: [facility] ROOM 08 / [facility]   Surgeons: Josefina Chew, MD       DISCUSSION: [name] is a 78 yo male who presents to PAT prior to surgery above. PMH of former smoking, HTN, CAD s/p CABG x 4 (2005), AS s/p AVR (2005), HOCM s/p septal myectomy (2005), PAF, TAA (4.3cm by CT), OSA (uses CPAP), hx of CVA (2023), hearing impairment, hepatitis, IDDM, neuropathy, CKD stage 3, hemochromatosis, prostate cancer s/p XRT (2012), arthritis.  Prior anesthesia complications include PONV, prolonged emergence  Patient with complex cardiac hx as listed above. Last seen in clinic on 06/06/23. He denied any significant symptoms. He takes Warfarin for his prosthetic aortic valve and PAF. Goal INR has been increased to 3-3.5 due to hx of recurrent CVAs. INR on 6/26 was 3.6. All other cardiac issues stable, he was cleared for surgery:  Chart reviewed as part of pre-operative protocol coverage. [name] was last seen on 06/06/2023 by Josefa Beauvais, NP.  Per Josefa, he may proceed with planned surgery without further cardiac testing. Therefore, based on ACC/AHA guidelines, the patient would be an acceptable risk for the planned procedure without further cardiovascular testing.  Per Pharm D, patient may hold warfarin for 5 days prior to the procedure. The patient will require bridging with Lovenox  (enoxaparin ) around the time of the procedure.Will route to the Coumadin  Clinic to coordinate Lovenox  bridging.     Seen by PCP on 07/18/23. All issues stable at that visit. DM and BP controlled. A1c 6.8.  Patient with hx of CVA. Admitted in 03/2022 with aphasia. Thought to be due to subtherapeutic INR. Also had a CVA in Dec 2023 due to L MCA occlusion  and underwent thrombectomy.  Last Dose Warfarin: 08/01/23 2200 (see 6/26 progress note from [employer], rn with lovenox  bridge instructions) Start Lovenox  08/03/23 BID LD Ozempic : 6/30  VS: BP 138/75   Pulse 62   Temp 36.5 C (Oral)   Resp 16   Ht 5' 8.5 (1.74 m)   Wt 91.6 kg   SpO2 97%   BMI 30.27 kg/m   PROVIDERS: Daryl Setter, NP   LABS: Labs reviewed: Acceptable for surgery. Kidney function stable. INR for DOS (all labs ordered are listed, but only abnormal results are displayed)  Labs Reviewed  SURGICAL PCR SCREEN - Abnormal; Notable for the following components:      Result Value   Staphylococcus aureus POSITIVE (*)    All other components within normal limits  HEMOGLOBIN A1C - Abnormal; Notable for the following components:   Hgb A1c MFr Bld 6.8 (*)    All other components within normal limits  GLUCOSE, CAPILLARY - Abnormal; Notable for the following components:   Glucose-Capillary 135 (*)    All other components within normal limits     IMAGES: CXR 07/18/23:  FINDINGS: Underinflation. Sternal wires. Calcified and tortuous aorta. Normal cardiopericardial silhouette. No consolidation, pneumothorax or effusion. No edema. Degenerative changes along the spine.   IMPRESSION: Postop chest.  No acute cardiopulmonary disease.   CTA chest 11/06/22:  IMPRESSION: 1. Stable ascending thoracic aortic aneurysm measuring 4.3 cm. Recommend annual imaging followup by CTA or MRA. This recommendation follows 2010 ACCF/AHA/AATS/ACR/ASA/SCA/SCAI/SIR/STS/SVM Guidelines for the Diagnosis and Management of Patients with Thoracic Aortic Disease. Circulation.  2010; 121: [id number]-z630. Aortic aneurysm NOS (ICD10-I71.9) 2. Stable 5 mm right lower lobe pulmonary nodule. No follow-up needed if patient is low-risk.This recommendation follows the consensus statement: Guidelines for Management of Incidental Pulmonary Nodules Detected on CT Images: From the Fleischner  Society 2017; Radiology 2017; 284:228-243. 3. Aortic atherosclerosis.   Aortic Atherosclerosis (ICD10-I70.0).  EKG 07/18/23: NSR, rate 63 LBBB  CV: Echo 04/10/22:  IMPRESSIONS    1. Left ventricular ejection fraction, by estimation, is 60 to 65%. The left ventricle has normal function. There is mild asymmetric left ventricular hypertrophy of the basal-septal segment.  2. The mitral valve is degenerative. Trivial mitral valve regurgitation.  3. Gadients across the mechancal aortic valve not measured. The aortic valve was not well visualized. There is a 23 mm St. Jude mechanical valve present in the aortic position. Procedure Date: 11/12/03. Echo findings are consistent with normal structure  and function of the aortic valve prosthesis.  4. The inferior vena cava is normal in size with greater than 50% respiratory variability, suggesting right atrial pressure of 3 mmHg.  5. No evidence of endocarditis on limited TTE. Consider TEE if clinical suspicion for endocarditis is high.  Past Medical History:  Diagnosis Date   Actinic keratosis 02/14/2011   Allergy    Aneurysm of ascending aorta without rupture (HCC) 01/06/2022   Aortic stenosis    Arthritis    Bilateral sensorineural hearing loss 06/16/2009   CAD (coronary artery disease)    cabg   Complication of anesthesia    Coronary atherosclerosis of native coronary artery 06/15/2009   Diabetic polyneuropathy 11/11/2018   Dyslipidemia 11/05/2018   Embolic stroke involving left middle cerebral artery  05/30/2021   Erectile dysfunction 05/18/2009   Essential hypertension 06/16/2009   Expressive aphasia 06/25/2021   External hemorrhoids 03/21/2010   Gout 04/09/2015   Heart murmur    Hemochromatosis 02/14/2011   History of aortic valve replacement 10/13/2009   History of CABG 11/05/2018   2005- 1. Coronary artery bypass grafting x4 with LIMA-LAD, SVG-OM1, SVG-AM and dCFX.  2. Aortic valve replacement with St Jude AVR 3.  Septal myomectomy.  4. Myoview low risk 2016   History of hepatitis 01/31/1972   Hypertriglyceridemia 06/13/2010   Hypertrophic obstructive cardiomyopathy 05/06/2008   SP septal myomectomy at surgery 2005 Echo Nov 2018- Hyperdynamic LVEF with severe basal septal hypertrophy. There is chordal SAM with resting gradient of 16 mmHg that increases to 85 mmHg with Valsalva        Leg weakness, bilateral 01/18/2021   Long term (current) use of anticoagulants 05/22/2018   Lumbar stenosis with neurogenic claudication 02/04/2021   Malignant neoplasm of prostate 06/17/2009   Mechanical heart valve present    Manufacturer: Deitra Rosemary Blade #: [id number]  Model #: [id number]. Card states MRI compatible with 3 teslas or less.   Obesity, unspecified 04/29/2008   OSA (obstructive sleep apnea) 07/12/2012   moderate-severe; uses CPAP nightly   PONV (postoperative nausea and vomiting)    after CABG- slow to wake up   Presence of prosthetic heart valve    Primary osteoarthritis of left shoulder 10/31/2018   S/P lumbar laminectomy 02/04/2021   Stage 3 chronic kidney disease due to type 2 diabetes mellitus 11/11/2018   Tobacco use 06/16/2009   Type 2 diabetes mellitus with hyperglycemia    Ventral hernia 03/03/2014    Past Surgical History:  Procedure Laterality Date   AORTIC VALVE REPLACEMENT  11/14/2003   [employer] [facility]   APPENDECTOMY  1990   CHOLECYSTECTOMY  1990   CORONARY ANGIOGRAPHY  10/11/2021   CORONARY ARTERY BYPASS GRAFT  10/2003   HERNIA REPAIR  1999   right, inguinal   HERNIA REPAIR  2002   left, inguinal   HIP SURGERY  2006   right hip   IR CT HEAD LTD  02/03/2022   IR PERCUTANEOUS ART THROMBECTOMY/INFUSION INTRACRANIAL INC DIAG ANGIO  01/24/2022   LUMBAR LAMINECTOMY/DECOMPRESSION MICRODISCECTOMY Bilateral 02/04/2021   Procedure: Bilateral Lumbar Two-Three Laminectomy;  Surgeon: Colon Shove, MD;  Location: [facility] OR;  Service: Neurosurgery;  Laterality: Bilateral;  3C/RM 20   PILONIDAL  CYST EXCISION  1964   prostate seed implant  03/2010   RADIOLOGY WITH ANESTHESIA N/A 01/24/2022   Procedure: IR WITH ANESTHESIA;  Surgeon: Radiologist, Medication, MD;  Location: [facility] OR;  Service: Radiology;  Laterality: N/A;   TEE WITHOUT CARDIOVERSION N/A 03/14/2022   Procedure: TRANSESOPHAGEAL ECHOCARDIOGRAM (TEE);  Surgeon: Loni Soyla LABOR, MD;  Location: [facility] ENDOSCOPY;  Service: Cardiology;  Laterality: N/A;   TONSILLECTOMY  childhood    MEDICATIONS:  AMBULATORY NON FORMULARY MEDICATION   aspirin  EC 81 MG tablet   BD PEN NEEDLE NANO 2ND GEN 32G X 4 MM MISC   Blood Glucose Monitoring Suppl Supplies MISC   Continuous Blood Gluc Sensor (DEXCOM G6 SENSOR) MISC   [START ON 08/03/2023] enoxaparin  (LOVENOX ) 100 MG/ML injection   FARXIGA  10 MG TABS tablet   fenofibrate  (TRICOR ) 145 MG tablet   furosemide  (LASIX ) 40 MG tablet   glucose blood (ONETOUCH VERIO) test strip   insulin  aspart (NOVOLOG  FLEXPEN) 100 UNIT/ML FlexPen   Insulin  Glargine (BASAGLAR  KWIKPEN) 100 UNIT/ML   isosorbide  mononitrate (IMDUR ) 30 MG 24 hr tablet   ketoconazole (NIZORAL) 2 % cream   metoprolol  succinate (TOPROL -XL) 25 MG 24 hr tablet   OneTouch Delica Lancets 33G MISC   PRESCRIPTION MEDICATION   rosuvastatin  (CRESTOR ) 40 MG tablet   Semaglutide , 2 MG/DOSE, 8 MG/3ML SOPN   tacrolimus  (PROTOPIC ) 0.1 % ointment   warfarin (COUMADIN ) 7.5 MG tablet   No current facility-administered medications for this encounter.   Burnard CHRISTELLA Odis DEVONNA [facility] Surgical Short Stay/Anesthesiology [facility] Phone [phone] 07/30/2023 10:17 AM

## 2023-08-01 NOTE — Care Plan (Signed)
 Ortho Bundle Case Management Note  Patient Details  Name: [name] MRN: [id number] Date of Birth: April 20, 1945  patient met with PA last week for H&P. will discharge to home with family support. no DME needed. OPPT set up with [facility]. discharge instructions discussed and questions answered. CM spoke with him today and he has no further questions or needs at this time. Patient and MD in agreement with plan. Choice offered                 DME Arranged:  [facility] rolling DME Agency:  [employer]  HH Arranged:    HH Agency:     Additional Comments: Please contact me with any questions of if this plan should need to change.  Charlies Pitch,  RN,BSN,MHA,CCM  [facility]opaedic [facility]ist  [phone] 08/01/2023, 9:05 AM

## 2023-08-06 DIAGNOSIS — H00012 Hordeolum externum right lower eyelid: Secondary | ICD-10-CM | POA: Diagnosis not present

## 2023-08-06 DIAGNOSIS — H00011 Hordeolum externum right upper eyelid: Secondary | ICD-10-CM | POA: Diagnosis not present

## 2023-08-06 DIAGNOSIS — H0102A Squamous blepharitis right eye, upper and lower eyelids: Secondary | ICD-10-CM | POA: Diagnosis not present

## 2023-08-06 NOTE — Discharge Instructions (Signed)
 POST-OPERATIVE INSTRUCTIONS - TOTAL SHOULDER REPLACEMENT    WOUND CARE You may leave the operative dressing in place until your follow-up appointment. KEEP THE INCISIONS CLEAN AND DRY. There may be a small amount of fluid/bleeding leaking at the surgical site. This is normal after surgery.  If it fills with liquid or blood please call us  immediately to change it for you. Use the provided ice machine or Ice packs as often as possible for the first 3-4 days, then as needed for pain relief.   Keep a layer of cloth or a shirt between your skin and the cooling unit to prevent frost bite as it can get very cold.  SHOWERING: - You may shower on Post-Op Day #3 - The dressing is water  resistant but do not scrub it as it may start to peel up.  Keep Dressing covered when you shower. - You may remove the sling for showering - Gently pat the area dry.  - Do not soak the shoulder in water .  - Do not go swimming in the pool or ocean until your incision has completely healed - KEEP THE INCISIONS CLEAN AND DRY.  EXERCISES Wear the sling at all times  You may remove the sling for showering, but keep the arm across the chest or in a secondary sling.    Accidental/Purposeful External Rotation and shoulder flexion (reaching behind you) is to be avoided at all costs for the first month. It is ok to come out of your sling if your are sitting and have assistance for eating.   Do not lift anything heavier than 1 pound until we discuss it further in clinic. It is normal for your fingers/hand to become more swollen after surgery and discolored from bruising.   This will resolve over the first few weeks usually after surgery. Please continue to ambulate and do not stay sitting or lying for too long.  Perform foot and wrist pumps to assist in circulation.  REGIONAL ANESTHESIA (NERVE BLOCKS) The anesthesia team may have performed a nerve block for you this is a great tool used to minimize pain.   The block may  start wearing off overnight (between 8-24 hours postop) When the block wears off, your pain may go from nearly zero to the pain you would have had postop without the block. This is an abrupt transition but nothing dangerous is happening.   This can be a challenging period but utilize your as needed pain medications to try and manage this period. We suggest you use the pain medication the first night prior to going to bed, to ease this transition.  You may take an extra dose of narcotic when this happens if needed  POST-OP MEDICATIONS In general your pain will be controlled with a combination of substances.  Prescriptions unless otherwise discussed are electronically sent to your pharmacy.    Acetaminophen  - Non-narcotic pain medicine taken on a scheduled basis  Oxycodone  - This is a strong narcotic, to be used only on an "as needed" basis for SEVERE pain. Zofran  -  take as needed for nausea  FOLLOW-UP If you develop a Fever (>101.5), Redness or Drainage from the surgical incision site, please call our office to arrange for an evaluation. Please call the office to schedule a follow-up appointment for a wound check, 7-10 days post-operatively.  IF YOU HAVE ANY QUESTIONS, PLEASE FEEL FREE TO CALL OUR OFFICE.  HELPFUL INFORMATION  Your arm will be in a sling following surgery. You will be in this  sling for the next 6 weeks.   You may be more comfortable sleeping in a semi-seated position the first few nights following surgery.  Keep a pillow propped under the elbow and forearm for comfort.  If you have a recliner type of chair it might be beneficial.  If not that is fine too, but it would be helpful to sleep propped up with pillows behind your operated shoulder as well under your elbow and forearm.  This will reduce pulling on the suture lines.  When dressing, put your operative arm in the sleeve first.  When getting undressed, take your operative arm out last.  Loose fitting, button-down  shirts are recommended.  In most states it is against the law to drive while your arm is in a sling. And certainly against the law to drive while taking narcotics.  You may return to work/school in the next couple of days when you feel up to it. Desk work and typing in the sling is fine.  We suggest you use the pain medication the first night prior to going to bed, in order to ease any pain when the anesthesia wears off. You should avoid taking pain medications on an empty stomach as it will make you nauseous.  You should wean off your narcotic medicines as soon as you are able.     Most patients will be off narcotics before their first postop appointment.   Do not drink alcoholic beverages or take illicit drugs when taking pain medications.  Pain medication may make you constipated.  Below are a few solutions to try in this order: Decrease the amount of pain medication if you aren't having pain. Drink lots of decaffeinated fluids. Drink prune juice and/or each dried prunes  If the first 3 don't work start with additional solutions Take Colace - an over-the-counter stool softener Take Senokot - an over-the-counter laxative Take Miralax  - a stronger over-the-counter laxative

## 2023-08-06 NOTE — H&P (Signed)
 SHOULDER ARTHROPLASTY ADMISSION H&P  Patient ID: [name] MRN: [id number] DOB/AGE: 78-20-1947 78 y.o.  Chief Complaint: left shoulder pain.  Planned Procedure Date: 08/07/23 Medical Clearance by Eleanor Ponto    HPI: [name] is a 78 y.o. male who presents for evaluation of Primary osteoarthritis of left shoulder. The patient has a history of pain and functional disability in the left shoulder due to arthritis and has failed non-surgical conservative treatments for greater than 12 weeks to include activity modification.  Onset of symptoms was gradual, starting 1 years ago with rapidlly worsening course since that time. The patient noted no past surgery on the left shoulder.  Patient currently rates pain as moderate with activity. Patient has worsening of pain with activity and weight bearing and pain that interferes with activities of daily living.  Patient has evidence of joint space narrowing by imaging studies.  There is no active infection.  Past Medical History:  Diagnosis Date   Actinic keratosis 02/14/2011   Allergy    Aneurysm of ascending aorta without rupture (HCC) 01/06/2022   Aortic stenosis    Arthritis    Bilateral sensorineural hearing loss 06/16/2009   CAD (coronary artery disease)    cabg   Complication of anesthesia    Coronary atherosclerosis of native coronary artery 06/15/2009   Diabetic polyneuropathy 11/11/2018   Dyslipidemia 11/05/2018   Embolic stroke involving left middle cerebral artery  05/30/2021   Erectile dysfunction 05/18/2009   Essential hypertension 06/16/2009   Expressive aphasia 06/25/2021   External hemorrhoids 03/21/2010   Gout 04/09/2015   Heart murmur    Hemochromatosis 02/14/2011   History of aortic valve replacement 10/13/2009   History of CABG 11/05/2018   2005- 1. Coronary artery bypass grafting x4 with LIMA-LAD, SVG-OM1, SVG-AM and dCFX.  2. Aortic valve replacement with St Jude AVR 3. Septal myomectomy.  4. Myoview  low risk 2016   History of hepatitis 01/31/1972   Hypertriglyceridemia 06/13/2010   Hypertrophic obstructive cardiomyopathy 05/06/2008   SP septal myomectomy at surgery 2005 Echo Nov 2018- Hyperdynamic LVEF with severe basal septal hypertrophy. There is chordal SAM with resting gradient of 16 mmHg that increases to 85 mmHg with Valsalva        Leg weakness, bilateral 01/18/2021   Long term (current) use of anticoagulants 05/22/2018   Lumbar stenosis with neurogenic claudication 02/04/2021   Malignant neoplasm of prostate 06/17/2009   Mechanical heart valve present    Manufacturer: Deitra Rosemary Blade #: [id number]  Model #: [id number]. Card states MRI compatible with 3 teslas or less.   Obesity, unspecified 04/29/2008   OSA (obstructive sleep apnea) 07/12/2012   moderate-severe; uses CPAP nightly   PONV (postoperative nausea and vomiting)    after CABG- slow to wake up   Presence of prosthetic heart valve    Primary osteoarthritis of left shoulder 10/31/2018   S/P lumbar laminectomy 02/04/2021   Stage 3 chronic kidney disease due to type 2 diabetes mellitus 11/11/2018   Tobacco use 06/16/2009   Type 2 diabetes mellitus with hyperglycemia    Ventral hernia 03/03/2014   Past Surgical History:  Procedure Laterality Date   AORTIC VALVE REPLACEMENT  11/14/2003   [facility]   APPENDECTOMY  1990   CHOLECYSTECTOMY  1990   CORONARY ANGIOGRAPHY  10/11/2021   CORONARY ARTERY BYPASS GRAFT  10/2003   HERNIA REPAIR  1999   right, inguinal   HERNIA REPAIR  2002   left, inguinal   HIP SURGERY  2006   right hip   IR CT HEAD LTD  02/03/2022   IR PERCUTANEOUS ART THROMBECTOMY/INFUSION INTRACRANIAL INC DIAG ANGIO  01/24/2022   LUMBAR LAMINECTOMY/DECOMPRESSION MICRODISCECTOMY Bilateral 02/04/2021   Procedure: Bilateral Lumbar Two-Three Laminectomy;  Surgeon: Colon Shove, MD;  Location: [facility] OR;  Service: Neurosurgery;  Laterality: Bilateral;  3C/RM 20   PILONIDAL CYST EXCISION  1964   prostate  seed implant  03/2010   RADIOLOGY WITH ANESTHESIA N/A 01/24/2022   Procedure: IR WITH ANESTHESIA;  Surgeon: Radiologist, Medication, MD;  Location: [facility] OR;  Service: Radiology;  Laterality: N/A;   TEE WITHOUT CARDIOVERSION N/A 03/14/2022   Procedure: TRANSESOPHAGEAL ECHOCARDIOGRAM (TEE);  Surgeon: Loni Soyla LABOR, MD;  Location: [facility] ENDOSCOPY;  Service: Cardiology;  Laterality: N/A;   TONSILLECTOMY  childhood   No Known Allergies Prior to Admission medications   Medication Sig Start Date End Date Taking? Authorizing Provider  aspirin  EC 81 MG tablet Take 1 tablet (81 mg total) by mouth daily. Swallow whole. 04/14/22  Yes Ghimire, Donalda HERO, MD  FARXIGA  10 MG TABS tablet Take 1 tablet (10 mg total) by mouth daily before breakfast. 07/17/23  Yes Shamleffer, Ibtehal Jaralla, MD  fenofibrate  (TRICOR ) 145 MG tablet TAKE 1 TABLET BY MOUTH EVERY DAY 07/12/23  Yes Webb, Padonda B, FNP  furosemide  (LASIX ) 40 MG tablet Take 1 tablet (40 mg total) by mouth daily. 09/05/22 07/23/26 Yes Pietro Redell RAMAN, MD  insulin  aspart (NOVOLOG  FLEXPEN) 100 UNIT/ML FlexPen Max daily 75 units Patient taking differently: Inject 14-19 Units into the skin See admin instructions. Inject 16 units into the skin with breakfast, 14 units with lunch, and 16 units with supper/evening meal- may increase to up to 19 units per dose as needed for high blood sugar 02/13/22  Yes Shamleffer, Ibtehal Jaralla, MD  Insulin  Glargine (BASAGLAR  KWIKPEN) 100 UNIT/ML INJECT 22 UNITS INTO THE SKIN DAILY. Patient taking differently: Inject 16 Units into the skin every evening. 06/19/23  Yes Shamleffer, Ibtehal Jaralla, MD  isosorbide  mononitrate (IMDUR ) 30 MG 24 hr tablet TAKE 1 TABLET BY MOUTH EVERY DAY 02/28/23  Yes Crenshaw, Redell RAMAN, MD  ketoconazole (NIZORAL) 2 % cream Apply 1 Application topically daily as needed for irritation.   Yes [provider]  metoprolol  succinate (TOPROL -XL) 25 MG 24 hr tablet TAKE 1 TABLET BY MOUTH EVERY DAY 07/12/23   Yes Webb, Padonda B, FNP  rosuvastatin  (CRESTOR ) 40 MG tablet TAKE 1 TABLET BY MOUTH EVERY DAY Patient taking differently: Take 40 mg by mouth every evening. 09/12/22  Yes Pietro Redell RAMAN, MD  Semaglutide , 2 MG/DOSE, 8 MG/3ML SOPN Inject 2 mg as directed once a week. Patient taking differently: Inject 2 mg as directed every Sunday. 05/22/23  Yes O'Sullivan, [name], NP  tacrolimus  (PROTOPIC ) 0.1 % ointment Apply 1 application  topically 2 (two) times daily as needed (for eczema). 11/29/20  Yes [provider]  warfarin (COUMADIN ) 7.5 MG tablet TAKE 1/2 TABLET TO 1 TABLET BY MOUTH DAILY AS DIRECTED BY THE COUMADIN  CLINIC Patient taking differently: Take 3.75-7.5 mg by mouth See admin instructions. Take 0.5 tablet (3.75 mg) by mouth on Mondays, Wednesdays & Fridays.  Take 1 tablet (7.5 mg) by mouth on Sundays, Tuesdays, Thursdays & Saturdays. 01/17/23  Yes Pietro Redell RAMAN, MD  AMBULATORY NON FORMULARY MEDICATION cpap cushions            AirFit F20 (Size: Large)           AirFit F30 (Size: Med)   Please FAX the  prescription to:  [phone] 02/10/20   Daryl Setter, NP  BD PEN NEEDLE NANO 2ND GEN 32G X 4 MM MISC 1 DEVICE BY DOES NOT APPLY ROUTE IN THE MORNING, AT NOON, IN THE EVENING, AND AT BEDTIME. 04/23/23   Shamleffer, Ibtehal Jaralla, MD  Blood Glucose Monitoring Suppl Supplies MISC Use for monitoring glucose level 08/05/18   Daryl Setter, NP  Continuous Blood Gluc Sensor (DEXCOM G6 SENSOR) MISC 1 Device by Does not apply route as directed. 05/18/20   Shamleffer, Ibtehal Jaralla, MD  enoxaparin  (LOVENOX ) 100 MG/ML injection Inject 1 mL (100 mg total) into the skin every 12 (twelve) hours. 08/03/23   Pietro Redell RAMAN, MD  glucose blood [facility] VERIO) test strip Use as instructed 09/15/20   Daryl Setter, NP  OneTouch Delica Lancets 33G MISC USE AS DIRECTED 09/15/20   Daryl Setter, NP  PRESCRIPTION MEDICATION CPAP- At bedtime    [provider]    Social History   Socioeconomic History   Marital status: Married    Spouse name: Not on file   Number of children: Not on file   Years of education: 56   Highest education level: Master's degree (e.g., MA, MS, MEng, MEd, MSW, MBA)  Occupational History   Occupation: Retired  Tobacco Use   Smoking status: Former    Types: Cigars    Quit date: 06/2021    Years since quitting: 2.1   Smokeless tobacco: Never   Tobacco comments:    Pt states he smokes 1 cigar about once a month. 06/01/2021    Pt quit  Cigars 06/2021  Vaping Use   Vaping status: Never Used  Substance and Sexual Activity   Alcohol use: Not Currently    Comment: wine maybe 1 per month   Drug use: No   Sexual activity: Never  Other Topics Concern   Not on file  Social History Narrative   ** Merged History Encounter ** Right Handed    Lives in a one sotyr home. One step leads to front     Lives with wife   Retired   Occasionally caffeine   Social Drivers of Health   Financial Resource Strain: Low Risk  (07/13/2023)   Overall Financial Resource Strain (CARDIA)    Difficulty of Paying Living Expenses: Not very hard  Food Insecurity: Food Insecurity Present (07/13/2023)   Hunger Vital Sign    Worried About Running Out of Food in the Last Year: Sometimes true    Ran Out of Food in the Last Year: Never true  Transportation Needs: No Transportation Needs (07/13/2023)   PRAPARE - Administrator, Civil Service (Medical): No    Lack of Transportation (Non-Medical): No  Physical Activity: Insufficiently Active (07/13/2023)   Exercise Vital Sign    Days of Exercise per Week: 3 days    Minutes of Exercise per Session: 20 min  Stress: No Stress Concern Present (07/13/2023)   [employer] of Occupational Health - Occupational Stress Questionnaire    Feeling of Stress: Not at all  Social Connections: Moderately Integrated (07/13/2023)   Social Connection and Isolation Panel    Frequency of Communication  with Friends and Family: Twice a week    Frequency of Social Gatherings with Friends and Family: Once a week    Attends Religious Services: More than 4 times per year    Active Member of [employer] or Organizations: No    Attends Banker Meetings: Not on file    Marital Status:  Married   Family History  Problem Relation Age of Onset   Heart disease Mother    Stroke Mother    Heart disease Father    Asperger's syndrome Son    Hyperlipidemia Son    Coronary artery disease Brother    Cancer Neg Hx        negative for colon cancer    ROS: Currently denies lightheadedness, dizziness, Fever, chills, CP, SOB.   No personal history of DVT, PE/ + MI and CVA. No loose teeth. + dentures All other systems have been reviewed and were otherwise currently negative with the exception of those mentioned in the HPI and as above.  BMI: Estimated body mass index is 30.27 kg/m as calculated from the following:   Height as of 07/26/23: 5' 8.5 (1.74 m).   Weight as of 07/26/23: 91.6 kg.  Lab Results  Component Value Date   ALBUMIN 4.5 07/18/2023   Diabetes:   Patient has a diagnosis of diabetes,  Lab Results  Component Value Date   HGBA1C 6.8 (H) 07/26/2023   Smoking Status:       Objective: Vitals: Height 5 feet 9 inches, weight 206.4 pounds.  BP 119/76, pulse 76, temperature 97.8, O2 93% on room air. Physical Exam: General: Alert, NAD.   HEENT: EOMI, Good Neck Extension  Pulm: No increased work of breathing.  Clear B/L A/P w/o crackle or wheeze.  CV: RRR, audible click from prosthetic valve GI: soft, NT, ND Neuro: Neuro without gross focal deficit.  Sensation intact distally Skin: No lesions in the area of chief complaint MSK/Surgical Site: He has 0 to 60 degrees of forward flexion at the left shoulder.  Internal rotation to the mid left buttock, external rotation to neutral.  All fingers flex and extend to their depth.  Distal sensation is intact.  2+ radial  pulse.  Imaging Review CT scan shows severe advanced osteoarthritic changes with retroversion.  Assessment: Severe left shoulder osteoarthritis with a fair amount of retroversion and some glenoid bone loss.     Plan: Plan for Procedure(s): ARTHROPLASTY, SHOULDER, TOTAL, REVERSE  The patient history, physical exam, clinical judgement of the provider and imaging are consistent with end stage degenerative joint disease and reverse total joint arthroplasty is deemed medically necessary. The treatment options including medical management, injection therapy, and arthroplasty were discussed at length. The risks and benefits of Procedure(s): ARTHROPLASTY, SHOULDER, TOTAL, REVERSE were presented and reviewed.  The risks of nonoperative treatment, versus surgical intervention including but not limited to continued pain, aseptic loosening, stiffness, dislocation/subluxation, infection, bleeding, nerve injury, blood clots, cardiopulmonary complications, morbidity, mortality, among others were discussed. The patient verbalizes understanding and wishes to proceed with the plan.  Patient is being admitted for surgery, OT, pain control, prophylactic antibiotics, VTE prophylaxis, progressive ambulation, ADL's and discharge planning.   The patient does meet the criteria for TXA which will be used perioperatively.   The patient is planning to be discharged home care of family.   Army MARLA Daring, PA-C 08/06/2023 12:29 PM

## 2023-08-07 ENCOUNTER — Ambulatory Visit (HOSPITAL_COMMUNITY): Payer: Self-pay | Admitting: Medical

## 2023-08-07 ENCOUNTER — Encounter (HOSPITAL_COMMUNITY): Payer: Self-pay | Admitting: Orthopedic Surgery

## 2023-08-07 ENCOUNTER — Other Ambulatory Visit: Payer: Self-pay

## 2023-08-07 ENCOUNTER — Observation Stay (HOSPITAL_COMMUNITY)
Admission: RE | Admit: 2023-08-07 | Discharge: 2023-08-08 | Disposition: A | Discharge status: Home / Self Care | Attending: Orthopedic Surgery | Admitting: Orthopedic Surgery

## 2023-08-07 ENCOUNTER — Observation Stay (HOSPITAL_COMMUNITY)

## 2023-08-07 ENCOUNTER — Encounter (HOSPITAL_COMMUNITY)
Admission: RE | Disposition: A | Discharge status: Home / Self Care | Payer: Self-pay | Source: Home / Self Care | Attending: Orthopedic Surgery

## 2023-08-07 DIAGNOSIS — Z79899 Other long term (current) drug therapy: Secondary | ICD-10-CM | POA: Insufficient documentation

## 2023-08-07 DIAGNOSIS — G8918 Other acute postprocedural pain: Secondary | ICD-10-CM | POA: Diagnosis not present

## 2023-08-07 DIAGNOSIS — E1122 Type 2 diabetes mellitus with diabetic chronic kidney disease: Secondary | ICD-10-CM | POA: Insufficient documentation

## 2023-08-07 DIAGNOSIS — I2510 Atherosclerotic heart disease of native coronary artery without angina pectoris: Secondary | ICD-10-CM | POA: Diagnosis not present

## 2023-08-07 DIAGNOSIS — Z87891 Personal history of nicotine dependence: Secondary | ICD-10-CM | POA: Insufficient documentation

## 2023-08-07 DIAGNOSIS — I129 Hypertensive chronic kidney disease with stage 1 through stage 4 chronic kidney disease, or unspecified chronic kidney disease: Secondary | ICD-10-CM | POA: Insufficient documentation

## 2023-08-07 DIAGNOSIS — Z951 Presence of aortocoronary bypass graft: Secondary | ICD-10-CM | POA: Diagnosis not present

## 2023-08-07 DIAGNOSIS — Z8546 Personal history of malignant neoplasm of prostate: Secondary | ICD-10-CM | POA: Diagnosis not present

## 2023-08-07 DIAGNOSIS — Z794 Long term (current) use of insulin: Secondary | ICD-10-CM | POA: Diagnosis not present

## 2023-08-07 DIAGNOSIS — Z952 Presence of prosthetic heart valve: Principal | ICD-10-CM

## 2023-08-07 DIAGNOSIS — M85812 Other specified disorders of bone density and structure, left shoulder: Secondary | ICD-10-CM | POA: Diagnosis not present

## 2023-08-07 DIAGNOSIS — Z96612 Presence of left artificial shoulder joint: Secondary | ICD-10-CM

## 2023-08-07 DIAGNOSIS — M19012 Primary osteoarthritis, left shoulder: Principal | ICD-10-CM | POA: Insufficient documentation

## 2023-08-07 DIAGNOSIS — Z7982 Long term (current) use of aspirin: Secondary | ICD-10-CM | POA: Insufficient documentation

## 2023-08-07 DIAGNOSIS — N1831 Chronic kidney disease, stage 3a: Secondary | ICD-10-CM

## 2023-08-07 DIAGNOSIS — I480 Paroxysmal atrial fibrillation: Secondary | ICD-10-CM

## 2023-08-07 DIAGNOSIS — Z471 Aftercare following joint replacement surgery: Secondary | ICD-10-CM | POA: Diagnosis not present

## 2023-08-07 DIAGNOSIS — Z8673 Personal history of transient ischemic attack (TIA), and cerebral infarction without residual deficits: Secondary | ICD-10-CM | POA: Diagnosis not present

## 2023-08-07 DIAGNOSIS — N183 Chronic kidney disease, stage 3 unspecified: Secondary | ICD-10-CM | POA: Diagnosis not present

## 2023-08-07 DIAGNOSIS — Z7901 Long term (current) use of anticoagulants: Secondary | ICD-10-CM | POA: Diagnosis not present

## 2023-08-07 HISTORY — PX: REVERSE SHOULDER ARTHROPLASTY: SHX5054

## 2023-08-07 HISTORY — DX: Presence of left artificial shoulder joint: Z96.612

## 2023-08-07 LAB — CBC
HCT: 39.4 % (ref 39.0–52.0)
Hemoglobin: 12.9 g/dL — ABNORMAL LOW (ref 13.0–17.0)
MCH: 29.3 pg (ref 26.0–34.0)
MCHC: 32.7 g/dL (ref 30.0–36.0)
MCV: 89.5 fL (ref 80.0–100.0)
Platelets: 196 K/uL (ref 150–400)
RBC: 4.4 MIL/uL (ref 4.22–5.81)
RDW: 14.2 % (ref 11.5–15.5)
WBC: 13.7 K/uL — ABNORMAL HIGH (ref 4.0–10.5)
nRBC: 0 % (ref 0.0–0.2)

## 2023-08-07 LAB — GLUCOSE, CAPILLARY
Glucose-Capillary: 168 mg/dL — ABNORMAL HIGH (ref 70–99)
Glucose-Capillary: 175 mg/dL — ABNORMAL HIGH (ref 70–99)
Glucose-Capillary: 164 mg/dL — ABNORMAL HIGH (ref 70–99)
Glucose-Capillary: 185 mg/dL — ABNORMAL HIGH (ref 70–99)

## 2023-08-07 LAB — CREATININE, SERUM
Creatinine, Ser: 1.13 mg/dL (ref 0.61–1.24)
GFR, Estimated: >60 mL/min (ref >60)

## 2023-08-07 SURGERY — ARTHROPLASTY, SHOULDER, TOTAL, REVERSE
Anesthesia: General | Site: Shoulder | Laterality: Left

## 2023-08-07 MED ORDER — DIPHENHYDRAMINE HCL 12.5 MG/5ML PO ELIX: 12.5000 mg | ORAL_SOLUTION | ORAL | Status: DC | PRN

## 2023-08-07 MED ORDER — ONDANSETRON HCL 4 MG/2ML IJ SOLN
INTRAMUSCULAR | Status: DC | PRN
  Administered 2023-08-07: 4 mg via INTRAVENOUS

## 2023-08-07 MED ORDER — BUPIVACAINE HCL (PF) 0.5 % IJ SOLN
INTRAMUSCULAR | Status: DC | PRN
  Administered 2023-08-07: 10 mL via PERINEURAL

## 2023-08-07 MED ORDER — OXYCODONE HCL 5 MG PO TABS: 5.0000 mg | ORAL_TABLET | Freq: Once | ORAL | Status: DC | PRN

## 2023-08-07 MED ORDER — ROCURONIUM BROMIDE 10 MG/ML (PF) SYRINGE
PREFILLED_SYRINGE | INTRAVENOUS | Status: DC | PRN
  Administered 2023-08-07: 10 mg via INTRAVENOUS
  Administered 2023-08-07: 20 mg via INTRAVENOUS
  Administered 2023-08-07: 50 mg via INTRAVENOUS
  Administered 2023-08-07 (×2): 10 mg via INTRAVENOUS

## 2023-08-07 MED ORDER — FENTANYL CITRATE PF 50 MCG/ML IJ SOSY
50.0000 ug | PREFILLED_SYRINGE | Freq: Once | INTRAMUSCULAR | Status: AC
  Administered 2023-08-07: 50 ug via INTRAVENOUS
  Filled 2023-08-07: qty 2

## 2023-08-07 MED ORDER — ACETAMINOPHEN 500 MG PO TABS
1000.0000 mg | ORAL_TABLET | Freq: Four times a day (QID) | ORAL | Status: AC
  Administered 2023-08-07 – 2023-08-08 (×4): 1000 mg via ORAL
  Filled 2023-08-07 (×4): qty 2

## 2023-08-07 MED ORDER — ROSUVASTATIN CALCIUM 20 MG PO TABS
40.0000 mg | ORAL_TABLET | Freq: Every evening | ORAL | Status: DC
  Administered 2023-08-07: 40 mg via ORAL
  Filled 2023-08-07: qty 2

## 2023-08-07 MED ORDER — METOCLOPRAMIDE HCL 5 MG PO TABS: 5.0000 mg | ORAL_TABLET | Freq: Three times a day (TID) | ORAL | Status: DC | PRN

## 2023-08-07 MED ORDER — CHLORHEXIDINE GLUCONATE 4 % EX SOLN: 1.0000 | CUTANEOUS | 1 refills | Status: DC

## 2023-08-07 MED ORDER — VANCOMYCIN HCL 1000 MG IV SOLR
INTRAVENOUS | Status: AC
  Filled 2023-08-07: qty 20

## 2023-08-07 MED ORDER — ONDANSETRON HCL 4 MG PO TABS: 4.0000 mg | ORAL_TABLET | Freq: Four times a day (QID) | ORAL | Status: DC | PRN

## 2023-08-07 MED ORDER — BISACODYL 10 MG RE SUPP: 10.0000 mg | Freq: Every day | RECTAL | Status: DC | PRN

## 2023-08-07 MED ORDER — FENTANYL CITRATE (PF) 250 MCG/5ML IJ SOLN
INTRAMUSCULAR | Status: DC | PRN
  Administered 2023-08-07: 25 ug via INTRAVENOUS
  Administered 2023-08-07: 50 ug via INTRAVENOUS
  Administered 2023-08-07: 25 ug via INTRAVENOUS

## 2023-08-07 MED ORDER — PROPOFOL 10 MG/ML IV BOLUS
INTRAVENOUS | Status: AC
Start: 2023-08-07 — End: 2023-08-07
  Filled 2023-08-07: qty 20

## 2023-08-07 MED ORDER — DEXAMETHASONE SODIUM PHOSPHATE 10 MG/ML IJ SOLN
INTRAMUSCULAR | Status: DC | PRN
  Administered 2023-08-07: 4 mg via INTRAVENOUS

## 2023-08-07 MED ORDER — HYDROMORPHONE HCL 1 MG/ML IJ SOLN: 0.5000 mg | INTRAMUSCULAR | Status: DC | PRN

## 2023-08-07 MED ORDER — ACETAMINOPHEN 10 MG/ML IV SOLN: 1000.0000 mg | Freq: Once | INTRAVENOUS | Status: DC | PRN

## 2023-08-07 MED ORDER — ARTIFICIAL TEARS OPHTHALMIC OINT
TOPICAL_OINTMENT | OPHTHALMIC | Status: AC
  Filled 2023-08-07: qty 3.5

## 2023-08-07 MED ORDER — INSULIN ASPART 100 UNIT/ML IJ SOLN: 0.0000 [IU] | INTRAMUSCULAR | Status: DC | PRN

## 2023-08-07 MED ORDER — WARFARIN SODIUM 10 MG PO TABS
11.2500 mg | ORAL_TABLET | Freq: Once | ORAL | Status: AC
  Administered 2023-08-07: 11.25 mg via ORAL
  Filled 2023-08-07: qty 1

## 2023-08-07 MED ORDER — ONDANSETRON HCL 4 MG/2ML IJ SOLN: 4.0000 mg | Freq: Four times a day (QID) | INTRAMUSCULAR | Status: DC | PRN

## 2023-08-07 MED ORDER — METHOCARBAMOL 500 MG PO TABS
500.0000 mg | ORAL_TABLET | Freq: Four times a day (QID) | ORAL | Status: DC | PRN
  Administered 2023-08-08: 500 mg via ORAL
  Filled 2023-08-07: qty 1

## 2023-08-07 MED ORDER — METOCLOPRAMIDE HCL 5 MG/ML IJ SOLN: 5.0000 mg | Freq: Three times a day (TID) | INTRAMUSCULAR | Status: DC | PRN

## 2023-08-07 MED ORDER — BUPIVACAINE LIPOSOME 1.3 % IJ SUSP
INTRAMUSCULAR | Status: DC | PRN
  Administered 2023-08-07: 10 mL via PERINEURAL

## 2023-08-07 MED ORDER — METHOCARBAMOL 1000 MG/10ML IJ SOLN: 500.0000 mg | Freq: Four times a day (QID) | INTRAMUSCULAR | Status: DC | PRN

## 2023-08-07 MED ORDER — INSULIN GLARGINE-YFGN 100 UNIT/ML ~~LOC~~ SOLN
16.0000 [IU] | Freq: Every day | SUBCUTANEOUS | Status: DC
  Administered 2023-08-07: 16 [IU] via SUBCUTANEOUS
  Filled 2023-08-07 (×2): qty 0.16

## 2023-08-07 MED ORDER — CEFAZOLIN SODIUM-DEXTROSE 2-4 GM/100ML-% IV SOLN
2.0000 g | Freq: Four times a day (QID) | INTRAVENOUS | Status: AC
  Administered 2023-08-07 (×2): 2 g via INTRAVENOUS
  Filled 2023-08-07 (×2): qty 100

## 2023-08-07 MED ORDER — INSULIN ASPART 100 UNIT/ML IJ SOLN
0.0000 [IU] | Freq: Three times a day (TID) | INTRAMUSCULAR | Status: DC
  Administered 2023-08-07 – 2023-08-08 (×2): 3 [IU] via SUBCUTANEOUS
  Administered 2023-08-08: 2 [IU] via SUBCUTANEOUS

## 2023-08-07 MED ORDER — POLYETHYLENE GLYCOL 3350 17 G PO PACK: 17.0000 g | PACK | Freq: Every day | ORAL | Status: DC | PRN

## 2023-08-07 MED ORDER — ISOSORBIDE MONONITRATE ER 30 MG PO TB24
30.0000 mg | ORAL_TABLET | Freq: Every day | ORAL | Status: DC
  Administered 2023-08-08: 30 mg via ORAL
  Filled 2023-08-07: qty 1

## 2023-08-07 MED ORDER — STERILE WATER FOR IRRIGATION IR SOLN
Status: DC | PRN
  Administered 2023-08-07: 2000 mL

## 2023-08-07 MED ORDER — MUPIROCIN 2 % EX OINT
1.0000 | TOPICAL_OINTMENT | Freq: Two times a day (BID) | CUTANEOUS | 0 refills | Status: AC
Start: 2023-08-07 — End: 2023-09-06

## 2023-08-07 MED ORDER — ONDANSETRON HCL 4 MG/2ML IJ SOLN: 4.0000 mg | Freq: Once | INTRAMUSCULAR | Status: DC | PRN

## 2023-08-07 MED ORDER — FENOFIBRATE 160 MG PO TABS
160.0000 mg | ORAL_TABLET | Freq: Every day | ORAL | Status: DC
  Administered 2023-08-08: 160 mg via ORAL
  Filled 2023-08-07: qty 1

## 2023-08-07 MED ORDER — FUROSEMIDE 40 MG PO TABS
40.0000 mg | ORAL_TABLET | Freq: Every day | ORAL | Status: DC
  Administered 2023-08-08: 40 mg via ORAL
  Filled 2023-08-07: qty 1

## 2023-08-07 MED ORDER — TRANEXAMIC ACID-NACL 1000-0.7 MG/100ML-% IV SOLN
1000.0000 mg | Freq: Once | INTRAVENOUS | Status: AC
  Administered 2023-08-07: 1000 mg via INTRAVENOUS
  Filled 2023-08-07: qty 100

## 2023-08-07 MED ORDER — POVIDONE-IODINE 10 % EX SWAB
2.0000 | Freq: Once | CUTANEOUS | Status: AC
Start: 2023-08-07 — End: 2023-08-07
  Administered 2023-08-07: 2 via TOPICAL

## 2023-08-07 MED ORDER — DEXAMETHASONE SODIUM PHOSPHATE 10 MG/ML IJ SOLN
INTRAMUSCULAR | Status: AC
Start: 2023-08-07 — End: 2023-08-07
  Filled 2023-08-07: qty 1

## 2023-08-07 MED ORDER — ORAL CARE MOUTH RINSE
15.0000 mL | OROMUCOSAL | Status: DC | PRN
Start: 2023-08-07 — End: 2023-08-08

## 2023-08-07 MED ORDER — OXYCODONE HCL 5 MG PO TABS
5.0000 mg | ORAL_TABLET | ORAL | Status: DC | PRN
  Administered 2023-08-08: 5 mg via ORAL
  Filled 2023-08-07: qty 1

## 2023-08-07 MED ORDER — PHENYLEPHRINE HCL (PRESSORS) 10 MG/ML IV SOLN
INTRAVENOUS | Status: AC
  Filled 2023-08-07: qty 1

## 2023-08-07 MED ORDER — CEFAZOLIN SODIUM-DEXTROSE 2-4 GM/100ML-% IV SOLN
2.0000 g | INTRAVENOUS | Status: AC
  Administered 2023-08-07: 2 g via INTRAVENOUS
  Filled 2023-08-07: qty 100

## 2023-08-07 MED ORDER — LIDOCAINE HCL (CARDIAC) PF 100 MG/5ML IV SOSY
PREFILLED_SYRINGE | INTRAVENOUS | Status: DC | PRN
  Administered 2023-08-07: 100 mg via INTRAVENOUS

## 2023-08-07 MED ORDER — ASPIRIN 81 MG PO TBEC
81.0000 mg | DELAYED_RELEASE_TABLET | Freq: Every day | ORAL | Status: DC
  Administered 2023-08-08: 81 mg via ORAL
  Filled 2023-08-07: qty 1

## 2023-08-07 MED ORDER — ORAL CARE MOUTH RINSE: 15.0000 mL | Freq: Once | OROMUCOSAL | Status: AC

## 2023-08-07 MED ORDER — ROCURONIUM BROMIDE 10 MG/ML (PF) SYRINGE
PREFILLED_SYRINGE | INTRAVENOUS | Status: AC
  Filled 2023-08-07: qty 10

## 2023-08-07 MED ORDER — ACETAMINOPHEN 500 MG PO TABS
1000.0000 mg | ORAL_TABLET | Freq: Once | ORAL | Status: AC
Start: 2023-08-07 — End: 2023-08-07
  Administered 2023-08-07: 1000 mg via ORAL
  Filled 2023-08-07: qty 2

## 2023-08-07 MED ORDER — ALUM & MAG HYDROXIDE-SIMETH 200-200-20 MG/5ML PO SUSP: 30.0000 mL | ORAL | Status: DC | PRN

## 2023-08-07 MED ORDER — MIDAZOLAM HCL 2 MG/2ML IJ SOLN: 1.0000 mg | Freq: Once | INTRAMUSCULAR | Status: AC

## 2023-08-07 MED ORDER — VANCOMYCIN HCL 1000 MG IV SOLR
INTRAVENOUS | Status: DC | PRN
  Administered 2023-08-07: 1000 mg

## 2023-08-07 MED ORDER — ACETAMINOPHEN 325 MG PO TABS: 325.0000 mg | ORAL_TABLET | Freq: Four times a day (QID) | ORAL | Status: DC | PRN

## 2023-08-07 MED ORDER — ENOXAPARIN SODIUM 100 MG/ML IJ SOSY
100.0000 mg | PREFILLED_SYRINGE | Freq: Two times a day (BID) | INTRAMUSCULAR | Status: DC
  Administered 2023-08-08: 100 mg via SUBCUTANEOUS
  Filled 2023-08-07: qty 1

## 2023-08-07 MED ORDER — POTASSIUM CHLORIDE IN NACL 20-0.9 MEQ/L-% IV SOLN
INTRAVENOUS | Status: DC
  Filled 2023-08-07 (×2): qty 1000

## 2023-08-07 MED ORDER — DAPAGLIFLOZIN PROPANEDIOL 10 MG PO TABS
10.0000 mg | ORAL_TABLET | Freq: Every day | ORAL | Status: DC
  Administered 2023-08-08: 10 mg via ORAL
  Filled 2023-08-07: qty 1

## 2023-08-07 MED ORDER — PHENYLEPHRINE HCL (PRESSORS) 10 MG/ML IV SOLN
INTRAVENOUS | Status: AC
Start: 2023-08-07 — End: 2023-08-07
  Filled 2023-08-07: qty 1

## 2023-08-07 MED ORDER — PROPOFOL 10 MG/ML IV BOLUS
INTRAVENOUS | Status: DC | PRN
  Administered 2023-08-07: 120 mg via INTRAVENOUS

## 2023-08-07 MED ORDER — CHLORHEXIDINE GLUCONATE 0.12 % MT SOLN
15.0000 mL | Freq: Once | OROMUCOSAL | Status: AC
  Administered 2023-08-07: 15 mL via OROMUCOSAL

## 2023-08-07 MED ORDER — LACTATED RINGERS IV SOLN: INTRAVENOUS | Status: DC

## 2023-08-07 MED ORDER — MENTHOL 3 MG MT LOZG: 1.0000 | LOZENGE | OROMUCOSAL | Status: DC | PRN

## 2023-08-07 MED ORDER — FENTANYL CITRATE (PF) 100 MCG/2ML IJ SOLN
INTRAMUSCULAR | Status: AC
  Filled 2023-08-07: qty 2

## 2023-08-07 MED ORDER — SUGAMMADEX SODIUM 200 MG/2ML IV SOLN
INTRAVENOUS | Status: DC | PRN
  Administered 2023-08-07: 200 mg via INTRAVENOUS

## 2023-08-07 MED ORDER — TRANEXAMIC ACID-NACL 1000-0.7 MG/100ML-% IV SOLN
1000.0000 mg | INTRAVENOUS | Status: AC
  Administered 2023-08-07: 1000 mg via INTRAVENOUS
  Filled 2023-08-07: qty 100

## 2023-08-07 MED ORDER — LIDOCAINE HCL (PF) 2 % IJ SOLN
INTRAMUSCULAR | Status: AC
Start: 2023-08-07 — End: 2023-08-07
  Filled 2023-08-07: qty 5

## 2023-08-07 MED ORDER — FENTANYL CITRATE PF 50 MCG/ML IJ SOSY: 25.0000 ug | PREFILLED_SYRINGE | INTRAMUSCULAR | Status: DC | PRN

## 2023-08-07 MED ORDER — MAGNESIUM CITRATE PO SOLN: 1.0000 | Freq: Once | ORAL | Status: DC | PRN

## 2023-08-07 MED ORDER — PHENOL 1.4 % MT LIQD: 1.0000 | OROMUCOSAL | Status: DC | PRN

## 2023-08-07 MED ORDER — WARFARIN - PHARMACIST DOSING INPATIENT: Freq: Every day | Status: DC

## 2023-08-07 MED ORDER — OXYCODONE HCL 5 MG PO TABS: 10.0000 mg | ORAL_TABLET | ORAL | Status: DC | PRN

## 2023-08-07 MED ORDER — METOPROLOL SUCCINATE ER 25 MG PO TB24
25.0000 mg | ORAL_TABLET | Freq: Every day | ORAL | Status: DC
  Administered 2023-08-08: 25 mg via ORAL
  Filled 2023-08-07: qty 1

## 2023-08-07 MED ORDER — ONDANSETRON HCL 4 MG/2ML IJ SOLN
INTRAMUSCULAR | Status: AC
Start: 2023-08-07 — End: 2023-08-07
  Filled 2023-08-07: qty 2

## 2023-08-07 MED ORDER — DOCUSATE SODIUM 100 MG PO CAPS
100.0000 mg | ORAL_CAPSULE | Freq: Two times a day (BID) | ORAL | Status: DC
  Administered 2023-08-07 – 2023-08-08 (×2): 100 mg via ORAL
  Filled 2023-08-07 (×2): qty 1

## 2023-08-07 MED ORDER — OXYCODONE HCL 5 MG/5ML PO SOLN: 5.0000 mg | Freq: Once | ORAL | Status: DC | PRN

## 2023-08-07 MED ORDER — PHENYLEPHRINE HCL-NACL 20-0.9 MG/250ML-% IV SOLN
INTRAVENOUS | Status: DC | PRN
Start: 2023-08-07 — End: 2023-08-07
  Administered 2023-08-07: 50 ug/min via INTRAVENOUS

## 2023-08-07 MED ORDER — 0.9 % SODIUM CHLORIDE (POUR BTL) OPTIME
TOPICAL | Status: DC | PRN
  Administered 2023-08-07: 1000 mL

## 2023-08-07 SURGICAL SUPPLY — 57 items
BAG COUNTER SPONGE SURGICOUNT (BAG) IMPLANT
BAG ZIPLOCK 12X15 (MISCELLANEOUS) ×1 IMPLANT
BASEPLATE AUG MED W-TAPER (Plate) IMPLANT
BEARING HUMERAL SHLDER 36M STD (Shoulder) IMPLANT
BIT DRILL 2.7 W/STOP DISP (BIT) IMPLANT
BIT DRILL QUICK REL 1/8 2PK SL (BIT) IMPLANT
BIT DRILL TWIST 2.7 (BIT) IMPLANT
BLADE SAW SGTL 73X25 THK (BLADE) ×1 IMPLANT
CLSR STERI-STRIP ANTIMIC 1/2X4 (GAUZE/BANDAGES/DRESSINGS) ×1 IMPLANT
COOLER ICEMAN CLASSIC (MISCELLANEOUS) ×1 IMPLANT
COVER BACK TABLE 60X90IN (DRAPES) ×1 IMPLANT
COVER SURGICAL LIGHT HANDLE (MISCELLANEOUS) ×1 IMPLANT
DRAPE POUCH INSTRU U-SHP 10X18 (DRAPES) ×1 IMPLANT
DRAPE SHEET LG 3/4 BI-LAMINATE (DRAPES) ×2 IMPLANT
DRAPE SURG 17X11 SM STRL (DRAPES) ×1 IMPLANT
DRAPE SURG 17X23 STRL (DRAPES) ×1 IMPLANT
DRAPE SURG ORHT 6 SPLT 77X108 (DRAPES) ×2 IMPLANT
DRAPE TOP 10253 STERILE (DRAPES) ×1 IMPLANT
DRAPE U-SHAPE 47X51 STRL (DRAPES) ×1 IMPLANT
DRSG MEPILEX POST OP 4X8 (GAUZE/BANDAGES/DRESSINGS) ×1 IMPLANT
DURAPREP 26ML APPLICATOR (WOUND CARE) ×2 IMPLANT
ELECT BLADE TIP CTD 4 INCH (ELECTRODE) ×1 IMPLANT
ELECT REM PT RETURN 15FT ADLT (MISCELLANEOUS) ×1 IMPLANT
FACESHIELD WRAPAROUND (MASK) ×2 IMPLANT
FACESHIELD WRAPAROUND OR TEAM (MASK) ×1 IMPLANT
GLENOID SPHERE STD STRL 36MM (Orthopedic Implant) IMPLANT
GLOVE BIO SURGEON STRL SZ 6.5 (GLOVE) ×1 IMPLANT
GLOVE BIOGEL PI IND STRL 7.0 (GLOVE) ×1 IMPLANT
GLOVE BIOGEL PI IND STRL 8 (GLOVE) ×1 IMPLANT
GLOVE ORTHO TXT STRL SZ7.5 (GLOVE) ×1 IMPLANT
GOWN STRL SURGICAL XL XLNG (GOWN DISPOSABLE) ×2 IMPLANT
HOOD PEEL AWAY T7 (MISCELLANEOUS) ×2 IMPLANT
KIT BASIN OR (CUSTOM PROCEDURE TRAY) ×1 IMPLANT
KIT TURNOVER KIT A (KITS) ×1 IMPLANT
PACK SHOULDER (CUSTOM PROCEDURE TRAY) ×1 IMPLANT
PAD COLD SHLDR WRAP-ON (PAD) ×1 IMPLANT
PIN THREADED REVERSE (PIN) IMPLANT
REAMER GUIDE BUSHING SURG DISP (MISCELLANEOUS) IMPLANT
REAMER GUIDE W/SCREW AUG (MISCELLANEOUS) IMPLANT
RESTRAINT HEAD UNIVERSAL NS (MISCELLANEOUS) ×1 IMPLANT
SCREW BONE LOCKING 4.75X30X3.5 (Screw) IMPLANT
SCREW CENTRAL 6.5X20MM (Screw) IMPLANT
SCREW LOCKING 4.75MMX15MM (Screw) IMPLANT
SCREW LOCKING NS 4.75MMX20MM (Screw) IMPLANT
SLING ARM IMMOBILIZER LRG (SOFTGOODS) ×1 IMPLANT
SPONGE T-LAP 4X18 ~~LOC~~+RFID (SPONGE) IMPLANT
STEM HUMERAL STRL 8MMX55MM (Stem) IMPLANT
SUCTION TUBE FRAZIER 12FR DISP (SUCTIONS) ×1 IMPLANT
SUPPORT WRAP ARM LG (MISCELLANEOUS) ×1 IMPLANT
SUT MAXBRAID #5 CCS-NDL 2PK (SUTURE) IMPLANT
SUT MNCRL AB 3-0 PS2 18 (SUTURE) ×1 IMPLANT
SUT VIC AB 1 CT1 36 (SUTURE) ×1 IMPLANT
SUT VIC AB 2-0 CT1 TAPERPNT 27 (SUTURE) ×1 IMPLANT
TOWEL OR 17X26 10 PK STRL BLUE (TOWEL DISPOSABLE) ×1 IMPLANT
TOWER SMARTMIX MINI (MISCELLANEOUS) IMPLANT
TRAY HUM MINI SHOULDER +3 40 (Joint) IMPLANT
TUBE SUCTION HIGH CAP CLEAR NV (SUCTIONS) ×1 IMPLANT

## 2023-08-07 NOTE — Progress Notes (Signed)
 PHARMACY - ANTICOAGULATION CONSULT NOTE  Pharmacy Consult for warfarin Indication: hx CVA and mechanical AVR  No Known Allergies  Patient Measurements: Weight: 91.6 kg (202 lb)  Vital Signs: Temp: 98.1 F (36.7 C) (07/08 0828) Temp Source: Oral (07/08 0828) BP: 128/96 (07/08 0905) Pulse Rate: 63 (07/08 0905)  Labs: No results for input(s): HGB, HCT, PLT, APTT, LABPROT, INR, HEPARINUNFRC, HEPRLOWMOCWT, CREATININE, CKTOTAL, CKMB, TROPONINIHS in the last 72 hours.  Estimated Creatinine Clearance: 47.2 mL/min (by C-G formula based on SCr of 1.43 mg/dL).   Medical History: Past Medical History:  Diagnosis Date   Actinic keratosis 02/14/2011   Allergy    Aneurysm of ascending aorta without rupture (HCC) 01/06/2022   Aortic stenosis    Arthritis    Bilateral sensorineural hearing loss 06/16/2009   CAD (coronary artery disease)    cabg   Complication of anesthesia    Coronary atherosclerosis of native coronary artery 06/15/2009   Diabetic polyneuropathy 11/11/2018   Dyslipidemia 11/05/2018   Embolic stroke involving left middle cerebral artery  05/30/2021   Erectile dysfunction 05/18/2009   Essential hypertension 06/16/2009   Expressive aphasia 06/25/2021   External hemorrhoids 03/21/2010   Gout 04/09/2015   Heart murmur    Hemochromatosis 02/14/2011   History of aortic valve replacement 10/13/2009   History of CABG 11/05/2018   2005- 1. Coronary artery bypass grafting x4 with LIMA-LAD, SVG-OM1, SVG-AM and dCFX.  2. Aortic valve replacement with St Jude AVR 3. Septal myomectomy.  4. Myoview low risk 2016   History of hepatitis 01/31/1972   Hypertriglyceridemia 06/13/2010   Hypertrophic obstructive cardiomyopathy 05/06/2008   SP septal myomectomy at surgery 2005 Echo Nov 2018- Hyperdynamic LVEF with severe basal septal hypertrophy. There is chordal SAM with resting gradient of 16 mmHg that increases to 85 mmHg with Valsalva        Leg weakness,  bilateral 01/18/2021   Long term (current) use of anticoagulants 05/22/2018   Lumbar stenosis with neurogenic claudication 02/04/2021   Malignant neoplasm of prostate 06/17/2009   Mechanical heart valve present    Manufacturer: Deitra Rosemary Blade #: [id number]  Model #: [id number]. Card states MRI compatible with 3 teslas or less.   Obesity, unspecified 04/29/2008   OSA (obstructive sleep apnea) 07/12/2012   moderate-severe; uses CPAP nightly   PONV (postoperative nausea and vomiting)    after CABG- slow to wake up   Presence of prosthetic heart valve    Primary osteoarthritis of left shoulder 10/31/2018   S/P lumbar laminectomy 02/04/2021   Stage 3 chronic kidney disease due to type 2 diabetes mellitus 11/11/2018   Tobacco use 06/16/2009   Type 2 diabetes mellitus with hyperglycemia    Ventral hernia 03/03/2014    Medications:  - PTA regimen per [facility] clinic note on 07/26/23: 7.5 mg daily except 3.75mg  on MWF (INR goal =  3-3.5)   AC clinic's periop recom.:  - 7/2: last dose of warf - 7/3: no warf or LMWH - 7/4 to 7/6: no warf; LMWH 100 mg q12h - 7/7: no warf; only take AM dose of LMWH - 7/8: Procedure day. No LMWH; Resume warf in the evening post procedure or as directed by doctor - take 11.25mg  - 7/9: take 7.5mg  + LMWH q12h - 7/10: take  7.5mg  +LMWH q12h - 7/11: take 3.75 mg + LMWH q12h - 7/12: take 7.5 mg + LMWH q12h - 7/13: take 7.5mg  + LMWH q12h - 7/14: return to clinic for INR check  Assessment: Patient is a 78 y.o M with hx CVA and mechanical AVR on warfarin PTA who presented to [facility] on 08/07[facility]23 for shoulder reverse arthroplasty.  Pharmacy has been consulted to resume warfarin s/p procedure.  Today, 08/07/2023: - had shoulder arthroplasty procedure today   Goal of Therapy:  INR goal: 3-3.5  Monitor platelets by anticoagulation protocol: Yes   Plan:  - warfarin 11.25 mg PO x1 today as recommended by [facility] clinic - daily INR - monitor for s/sx bleeding - Therapeutic dose  LMWH q12h to start on 08/08/23 (ordered by Ortho team)   Osie Critchley P 08/07/2023,12:57 PM

## 2023-08-07 NOTE — Progress Notes (Signed)
   08/07/23 2224  BiPAP/CPAP/SIPAP  BiPAP/CPAP/SIPAP Pt Type Adult  BiPAP/CPAP/SIPAP Resmed  Mask Type Nasal pillows  Dentures removed? Not applicable  Mask Size Medium  EPAP 6 cmH2O (per pt)  FiO2 (%) 21 %  Patient Home Machine No  Patient Home Mask Yes  Patient Home Tubing No  Auto Titrate No  Device Plugged into [employer] Power Outlet Yes

## 2023-08-07 NOTE — Op Note (Signed)
 08/07/2023  2:11 PM  PATIENT:  [name]    PRE-OPERATIVE DIAGNOSIS: Left shoulder severe osteoarthritis with glenoid bone loss  POST-OPERATIVE DIAGNOSIS:  Same  PROCEDURE: LEFT reverse Total Shoulder Arthroplasty  SURGEON:  [name], MD  PHYSICIAN ASSISTANT: Army Daring, PA-C, present and scrubbed throughout the case, critical for completion in a timely fashion, and for retraction, instrumentation, and closure.  ANESTHESIA:   General with interscalene block using Exparel   ESTIMATED BLOOD LOSS: 150 mL  UNIQUE ASPECTS OF THE CASE: The humeral head had significant deformity with erosions posteriorly, and was very difficult to dislocate.  The rotator cuff seemed present.  The glenoid had substantial bone loss, with posterior erosions, with central concavity, and a fibrinous covering over the posterior glenoid face.  I did elect to use a medium augment posterior superiorly in order to minimize medialization and bone loss.  The central screw was 23 mm in depth, and I used a 20, and I did not have the greatest fixation but I had what felt like nearly complete contact with seeding of the baseplate, with the augment, and excellent fixation with the locking screws.  The reduction was fairly smooth, without undue tightness.  PREOPERATIVE INDICATIONS:  [name] is a  78 y.o. male with a diagnosis of Primary osteoarthritis of left shoulder who failed conservative measures and elected for surgical management.    The risks benefits and alternatives were discussed with the patient preoperatively including but not limited to the risks of infection, bleeding, nerve injury, cardiopulmonary complications, the need for revision surgery, dislocation, brachial plexus palsy, incomplete relief of pain, among others, and the patient was willing to proceed.  OPERATIVE IMPLANTS:   Implant Name: BASEPLATE AUG MED W-TAPER - [id number] Type: Plate Inv. Item: BASEPLATE AUG MED W-TAPER Serial  No.:  Manufacturer: [employer](ORTH,TRAU,BIO,SG) Lot No.: [id number] LRB: Left No. Used: 1 Action: Implanted   Implant Name: SCREW CENTRAL 6.5X20MM - [id number] Type: Screw Inv. Item: SCREW CENTRAL 6.5X20MM Serial No.:  Manufacturer: [employer](ORTH,TRAU,BIO,SG) Lot No.: [id number] LRB: Left No. Used: 1 Action: Implanted   Implant Name: SCREW BONE LOCKING 5.24K69K6.5 - [id number] Type: Screw Inv. Item: SCREW BONE LOCKING [id number] Serial No.:  Manufacturer: [employer](ORTH,TRAU,BIO,SG) Lot No.: [id number] LRB: Left No. Used: 1 Action: Implanted   Implant Name: SCREW LOCKING NS 4.24FFK79FF - [id number] Type: Screw Inv. Item: SCREW LOCKING NS 4.24FFK79FF Serial No.:  Manufacturer: [employer](ORTH,TRAU,BIO,SG) Lot No.: [id number] LRB: Left No. Used: 1 Action: Implanted   Implant Name: AMPARO FAVA 4.75MMX15MM - [id number] Type: Screw Inv. Item: SCREW LOCKING 4.75MMX15MM Serial No.:  Manufacturer: [employer](ORTH,TRAU,BIO,SG) Lot No.: [id number] LRB: Left No. Used: 1 Action: Implanted   Implant NameBETHA AMPARO FAVA 4.75MMX15MM - [id number] Type: Screw Inv. Item: SCREW LOCKING 4.75MMX15MM Serial No.:  Manufacturer: [employer](ORTH,TRAU,BIO,SG) Lot No.: [id number] LRB: Left No. Used: 1 Action: Implanted   Implant Name: DINO BAL STD STRL - [id number] Type: Orthopedic Implant Inv. Item: GLENOID SPHERE STD STRL Serial No.:  Manufacturer: [employer](ORTH,TRAU,BIO,SG) Lot No.: [id number] LRB: Left No. Used: 1 Action: Implanted   Implant Name: BEARING HUMERAL SHLDER 19M STD - [id number] Type: Shoulder Inv. Item: BEARING HUMERAL SHLDER 19M STD Serial No.:  Manufacturer: [employer](ORTH,TRAU,BIO,SG) Lot No.: [id number] LRB: Left No. Used: 1 Action: Implanted   Implant Name: BEARING HUMERAL SHLDER 19M STD - [id number] Type: Shoulder Inv. Item: BEARING HUMERAL SHLDER 19M STD Serial No.:  Manufacturer: [employer]  [employer](ORTH,TRAU,BIO,SG) Lot No.: [id number] LRB: Left No. Used: 1 Action: Implanted  Implant Name: FENTON GIRT STRL [id number] - [id number] Type: Stem Inv. Item: STEM HUMERAL STRL [id number] Serial No.:  Manufacturer: [employer](ORTH,TRAU,BIO,SG) Lot No.: [id number] LRB: Left No. Used: 1 Action: Implanted   OPERATIVE FINDINGS: Severe erosive osteoarthritis of both the glenoid and the humeral head with bone loss on the glenoid.  OPERATIVE PROCEDURE: The patient was brought to the operating room and placed in the supine position. General anesthesia was administered. IV antibiotics were given.  Time out was performed. The upper extremity was prepped and draped in usual sterile fashion. The patient was in a beachchair position. Deltopectoral approach was carried out. The biceps was tenodesed to the pectoralis tendon with #2 Maxbraid. The subscapularis was released off of the bone.   I then performed circumferential releases of the humerus, and then dislocated the head, and then reamed with the reamer to the above named size.  I then applied the jig, and cut the humeral head in 30 of retroversion, and then turned my attention to the glenoid.  Deep retractors were placed, and I resected the labrum, and then placed a guidepin into the center position on the glenoid, with slight inferior inclination. I then reamed over the guidepin, and this created a small metaphyseal cancellus blush inferiorly, removing just the cartilage to the subchondral bone superiorly. The base plate was selected and impacted place, and then I secured it centrally with a nonlocking screw, and I had excellent purchase both inferiorly and superiorly. I placed a short locking screws on anterior and posterior aspects.  I then turned my attention to the glenosphere, and impacted this into place, placing slight inferior offset (set on B).   The glenosphere was completely seated, and had engagement of the [facility] taper.  I used a  rongeur to remove any inferior bone off of the glenoid, although because of the bone loss we had already medialized a fair amount up to the neck of the glenoid.  I then turned my attention back to the humerus.  I sequentially broached, and then trialed, and was found to restore soft tissue tension, and it had 2 finger tightness. Therefore the above named components were selected. The shoulder felt stable throughout functional motion.  Before I placed the real prosthesis I had also placed a total of 3 #5 Maxbraid through the humerus for later subscapularis repair.  I then impacted the real prosthesis into place, as well as the real humeral tray, and reduced the shoulder. The shoulder had excellent motion, and was stable, and I irrigated the wounds copiously.    I then used the Maxbraid suture to repair the subscapularis. This came down to bone.  I repaired the rotator interval with Vicryl.  I then irrigated the shoulder copiously once more, repaired the deltopectoral interval with Vicryl followed by subcutaneous Vicryl with Steri-Strips and sterile gauze for the skin. The patient was awakened and returned back in stable and satisfactory condition. There were no complications and He tolerated the procedure well.

## 2023-08-07 NOTE — Transfer of Care (Signed)
 Immediate Anesthesia Transfer of Care Note  Patient: [name]  Procedure(s) Performed: ARTHROPLASTY, SHOULDER, TOTAL, REVERSE (Left: Shoulder)  Patient Location: PACU  Anesthesia Type:GA combined with regional for post-op pain  Level of Consciousness: awake and patient cooperative  Airway & Oxygen Therapy: Patient Spontanous Breathing and Patient connected to face mask oxygen  Post-op Assessment: Report given to RN and Post -op Vital signs reviewed and stable  Post vital signs: Reviewed and stable  Last Vitals:  Vitals Value Taken Time  BP 137/73 08/07/23 12:45  Temp    Pulse 63 08/07/23 12:46  Resp 12 08/07/23 12:46  SpO2 100 % 08/07/23 12:46  Vitals shown include unfiled device data.  Last Pain:  Vitals:   08/07/23 0905  TempSrc:   PainSc: 0-No pain         Complications: No notable events documented.

## 2023-08-07 NOTE — Anesthesia Procedure Notes (Signed)
 Anesthesia Regional Block: Interscalene brachial plexus block   Pre-Anesthetic Checklist: , timeout performed,  Correct Patient, Correct Site, Correct Laterality,  Correct Procedure, Correct Position, site marked,  Risks and benefits discussed,  Surgical consent,  Pre-op evaluation,  At surgeon's request and post-op pain management  Laterality: Left  Prep: chloraprep       Needles:  Injection technique: Single-shot  Needle Type: Echogenic Needle     Needle Length: 4cm  Needle Gauge: 25     Additional Needles:   Procedures:,,,, ultrasound used (permanent image in chart),,    Narrative:  Start time: 08/07/2023 8:55 AM End time: 08/07/2023 9:00 AM Injection made incrementally with aspirations every 5 mL.  Performed by: Personally  Anesthesiologist: Boone Fess, MD  Additional Notes: Patient's chart reviewed and they were deemed appropriate candidate for procedure, at surgeon's request. Patient educated about risks, benefits, and alternatives of the block including but not limited to: temporary or permanent nerve damage, bleeding, infection, damage to surround tissues, pneumothorax, hemidiaphragmatic paralysis, unilateral Horner's syndrome, block failure, local anesthetic toxicity. Patient expressed understanding. A formal time-out was conducted consistent with institution rules.  Monitors were applied, and minimal sedation used (see nursing record). The site was prepped with skin prep and allowed to dry, and sterile gloves were used. A high frequency linear ultrasound probe with probe cover was utilized throughout. C5-7 nerve roots located and appeared anatomically normal, local anesthetic injected around them, and echogenic block needle trajectory was monitored throughout. Aspiration performed every 5ml. Lung and blood vessels were avoided. All injections were performed without resistance and free of blood and paresthesias. The patient tolerated the procedure well.  Injectate: 10ml  exparel  + 10ml 0.5% bupivacaine 

## 2023-08-07 NOTE — Anesthesia Preprocedure Evaluation (Addendum)
 Anesthesia Evaluation  Patient identified by MRN, date of birth, ID band Patient awake  General Assessment Comment:  Denies PONV hx  Reviewed: Allergy & Precautions, NPO status , Patient's Chart, lab work & pertinent test results, reviewed documented beta blocker date and time   History of Anesthesia Complications Negative for: history of anesthetic complications  Airway Mallampati: III  TM Distance: >3 FB Neck ROM: Full    Dental  (+) Chipped, Missing   Pulmonary sleep apnea and Continuous Positive Airway Pressure Ventilation , neg COPD, Patient abstained from smoking.Not current smoker, former smoker   Pulmonary exam normal breath sounds clear to auscultation       Cardiovascular Exercise Tolerance: Good METS: 3 - Mets hypertension, Pt. on medications and Pt. on home beta blockers + CAD, + Past MI and + CABG  + dysrhythmias Atrial Fibrillation (-) pacemaker Rhythm:Regular Rate:Normal - Systolic murmurs TTE 2024: EF 39-34%, mild asymmetric LVH of the basal-septal segment, s/p AVR    Neuro/Psych Hx 4 strokes, most recent one 16 months ago. Mild residual expressive aphasia, right sided LE neuropathy, unclear if from stroke or diabetes CVA (expressive aphasia), Residual Symptoms  negative psych ROS   GI/Hepatic Neg liver ROS,neg GERD  ,,  Endo/Other  diabetes, Type 2, Insulin  Dependent  GLP1 has been held for at least two weeks. Denies GI symptoms today  Renal/GU Renal InsufficiencyRenal disease     Musculoskeletal  (+) Arthritis ,    Abdominal   Peds  Hematology negative hematology ROS (+)   Anesthesia Other Findings Past Medical History: 02/14/2011: Actinic keratosis No date: Allergy 01/06/2022: Aneurysm of ascending aorta without rupture (HCC) No date: Aortic stenosis No date: Arthritis 06/16/2009: Bilateral sensorineural hearing loss No date: CAD (coronary artery disease)     Comment:  cabg No date:  Complication of anesthesia 06/15/2009: Coronary atherosclerosis of native coronary artery 11/11/2018: Diabetic polyneuropathy 11/05/2018: Dyslipidemia 05/30/2021: Embolic stroke involving left middle cerebral artery  05/18/2009: Erectile dysfunction 06/16/2009: Essential hypertension 06/25/2021: Expressive aphasia 03/21/2010: External hemorrhoids 04/09/2015: Gout No date: Heart murmur 02/14/2011: Hemochromatosis 10/13/2009: History of aortic valve replacement 11/05/2018: History of CABG     Comment:  2005- 1. Coronary artery bypass grafting x4 with               LIMA-LAD, SVG-OM1, SVG-AM and dCFX.  2. Aortic valve               replacement with [employer] AVR 3. Septal myomectomy.  4.               Myoview low risk 2016 01/31/1972: History of hepatitis 06/13/2010: Hypertriglyceridemia 05/06/2008: Hypertrophic obstructive cardiomyopathy     Comment:  SP septal myomectomy at surgery 2005 Echo Nov 2018-               Hyperdynamic LVEF with severe basal septal hypertrophy.               There is chordal SAM with resting gradient of 16 mmHg               that increases to 85 mmHg with Valsalva      01/18/2021: Leg weakness, bilateral 05/22/2018: Long term (current) use of anticoagulants 02/04/2021: Lumbar stenosis with neurogenic claudication 06/17/2009: Malignant neoplasm of prostate No date: Mechanical heart valve present     Comment:  Manufacturer: Deitra Rosemary Blade #: [id number]  Model #:               [id number]. Card  states MRI compatible with 3 teslas or               less. 04/29/2008: Obesity, unspecified 07/12/2012: OSA (obstructive sleep apnea)     Comment:  moderate-severe; uses CPAP nightly No date: PONV (postoperative nausea and vomiting)     Comment:  after CABG- slow to wake up No date: Presence of prosthetic heart valve 10/31/2018: Primary osteoarthritis of left shoulder 02/04/2021: S/P lumbar laminectomy 11/11/2018: Stage 3 chronic kidney disease due to type 2 diabetes   mellitus 06/16/2009: Tobacco use No date: Type 2 diabetes mellitus with hyperglycemia 03/03/2014: Ventral hernia   Reproductive/Obstetrics                              Anesthesia Physical Anesthesia Plan  ASA: 3  Anesthesia Plan: General   Post-op Pain Management: Regional block* and Tylenol  PO (pre-op)*   Induction: Intravenous  PONV Risk Score and Plan: 2 and Treatment may vary due to age or medical condition, Ondansetron  and Dexamethasone   Airway Management Planned: Oral ETT  Additional Equipment: None  Intra-op Plan:   Post-operative Plan: Extubation in OR  Informed Consent: I have reviewed the patients History and Physical, chart, labs and discussed the procedure including the risks, benefits and alternatives for the proposed anesthesia with the patient or authorized representative who has indicated his/her understanding and acceptance.     Dental advisory given  Plan Discussed with: CRNA and Surgeon  Anesthesia Plan Comments: (Patient drank gatorade at 0730, will not initiate general anesthetic until 0930. Discussed risks of anesthesia with patient, including PONV, sore throat, lip/dental/eye damage. Rare risks discussed as well, such as cardiorespiratory and neurological sequelae, and allergic reactions. Discussed the role of CRNA in patient's perioperative care. Patient understands. Discussed r/b/a of interscalene block, including elective nature. Risks discussed: - Rare: bleeding, infection, nerve damage - shortness of breath from hemidiaphragmatic paralysis - unilateral horner's syndrome - poor/non-working blocks - reactions and toxicity to local anesthetic Patient understands and agrees. )         Anesthesia Quick Evaluation

## 2023-08-07 NOTE — Anesthesia Procedure Notes (Signed)
 Procedure Name: Intubation Date/Time: 08/07/2023 10:06 AM  Performed by: Boone Fess, MDPre-anesthesia Checklist: Patient identified, Patient being monitored, Timeout performed, Emergency Drugs available and Suction available Patient Re-evaluated:Patient Re-evaluated prior to induction Oxygen Delivery Method: Circle system utilized Preoxygenation: Pre-oxygenation with 100% oxygen Induction Type: IV induction Ventilation: Two handed mask ventilation required and Oral airway inserted - appropriate to patient size Laryngoscope Size: Glidescope and 4 Grade View: Grade III Tube type: Oral Tube size: 7.0 mm Number of attempts: 2 Airway Equipment and Method: Stylet Placement Confirmation: ETT inserted through vocal cords under direct vision, positive ETCO2 and breath sounds checked- equal and bilateral Secured at: 21 cm Tube secured with: Tape Dental Injury: Injury to lip  Difficulty Due To: Difficulty was anticipated and Difficult Airway- due to anterior larynx Comments: Suspected challenging airway, Glidescope present in room pre induction. One unsuccessful attempt by CRNA with Cleotilde 2, followed by successful attempt by MD utilizing Glidescope S4, grade III view.

## 2023-08-07 NOTE — Interval H&P Note (Signed)
 History and Physical Interval Note:  08/07/2023 9:25 AM  [name]  has presented today for surgery, with the diagnosis of Primary osteoarthritis of left shoulder.  The various methods of treatment have been discussed with the patient and family. After consideration of risks, benefits and other options for treatment, the patient has consented to  Procedure(s): ARTHROPLASTY, SHOULDER, TOTAL, REVERSE (Left) as a surgical intervention.  The patient's history has been reviewed, patient examined, no change in status, stable for surgery.  I have reviewed the patient's chart and labs.  Questions were answered to the patient's satisfaction.     [name]

## 2023-08-07 NOTE — Anesthesia Postprocedure Evaluation (Signed)
 Anesthesia Post Note  Patient: [name]  Procedure(s) Performed: ARTHROPLASTY, SHOULDER, TOTAL, REVERSE (Left: Shoulder)     Patient location during evaluation: PACU Anesthesia Type: General Level of consciousness: awake and alert Pain management: pain level controlled Vital Signs Assessment: post-procedure vital signs reviewed and stable Respiratory status: spontaneous breathing, nonlabored ventilation, respiratory function stable and patient connected to nasal cannula oxygen Cardiovascular status: blood pressure returned to baseline and stable Postop Assessment: no apparent nausea or vomiting Anesthetic complications: no   No notable events documented.  Last Vitals:  Vitals:   08/07/23 1306 08/07/23 1315  BP:  115/67  Pulse: (!) 57 62  Resp: 14 18  Temp:    SpO2: 93% 93%    Last Pain:  Vitals:   08/07/23 1315  TempSrc:   PainSc: 3                  Rome Ade

## 2023-08-08 DIAGNOSIS — M19012 Primary osteoarthritis, left shoulder: Secondary | ICD-10-CM | POA: Diagnosis not present

## 2023-08-08 LAB — CBC
HCT: 37.9 % — ABNORMAL LOW (ref 39.0–52.0)
Hemoglobin: 12.2 g/dL — ABNORMAL LOW (ref 13.0–17.0)
MCH: 29.6 pg (ref 26.0–34.0)
MCHC: 32.2 g/dL (ref 30.0–36.0)
MCV: 92 fL (ref 80.0–100.0)
Platelets: 173 K/uL (ref 150–400)
RBC: 4.12 MIL/uL — ABNORMAL LOW (ref 4.22–5.81)
RDW: 14.2 % (ref 11.5–15.5)
WBC: 9.9 K/uL (ref 4.0–10.5)
nRBC: 0 % (ref 0.0–0.2)

## 2023-08-08 LAB — PROTIME-INR
INR: 1 (ref 0.8–1.2)
Prothrombin Time: 14 s (ref 11.4–15.2)

## 2023-08-08 LAB — BASIC METABOLIC PANEL WITH GFR
Anion gap: 10 (ref 5–15)
BUN: 24 mg/dL — ABNORMAL HIGH (ref 8–23)
CO2: 22 mmol/L (ref 22–32)
Calcium: 8.3 mg/dL — ABNORMAL LOW (ref 8.9–10.3)
Chloride: 104 mmol/L (ref 98–111)
Creatinine, Ser: 0.82 mg/dL (ref 0.61–1.24)
GFR, Estimated: >60 mL/min (ref >60)
Glucose, Bld: 150 mg/dL — ABNORMAL HIGH (ref 70–99)
Potassium: 3.8 mmol/L (ref 3.5–5.1)
Sodium: 136 mmol/L (ref 135–145)

## 2023-08-08 LAB — GLUCOSE, CAPILLARY
Glucose-Capillary: 133 mg/dL — ABNORMAL HIGH (ref 70–99)
Glucose-Capillary: 157 mg/dL — ABNORMAL HIGH (ref 70–99)

## 2023-08-08 MED ORDER — SENNA-DOCUSATE SODIUM 8.6-50 MG PO TABS: 2.0000 | ORAL_TABLET | Freq: Every day | ORAL | 1 refills | Status: AC

## 2023-08-08 MED ORDER — ONDANSETRON HCL 4 MG PO TABS: 4.0000 mg | ORAL_TABLET | Freq: Three times a day (TID) | ORAL | 0 refills | Status: AC | PRN

## 2023-08-08 MED ORDER — OXYCODONE HCL 5 MG PO TABS: 5.0000 mg | ORAL_TABLET | ORAL | 0 refills | Status: AC | PRN

## 2023-08-08 MED ORDER — METHOCARBAMOL 500 MG PO TABS: 500.0000 mg | ORAL_TABLET | Freq: Three times a day (TID) | ORAL | 0 refills | Status: AC | PRN

## 2023-08-08 NOTE — Discharge Summary (Signed)
 Discharge Summary  Patient ID: [name] MRN: [id number] DOB/AGE: Nov 11, 1945 78 y.o.  Admit date: 08/07/2023 Discharge date: 08/08/2023  Admission Diagnoses:  S/P reverse total shoulder arthroplasty, left  Discharge Diagnoses:  Principal Problem:   S/P reverse total shoulder arthroplasty, left   Past Medical History:  Diagnosis Date   Actinic keratosis 02/14/2011   Allergy    Aneurysm of ascending aorta without rupture (HCC) 01/06/2022   Aortic stenosis    Arthritis    Bilateral sensorineural hearing loss 06/16/2009   CAD (coronary artery disease)    cabg   Complication of anesthesia    Coronary atherosclerosis of native coronary artery 06/15/2009   Diabetic polyneuropathy 11/11/2018   Dyslipidemia 11/05/2018   Embolic stroke involving left middle cerebral artery  05/30/2021   Erectile dysfunction 05/18/2009   Essential hypertension 06/16/2009   Expressive aphasia 06/25/2021   External hemorrhoids 03/21/2010   Gout 04/09/2015   Heart murmur    Hemochromatosis 02/14/2011   History of aortic valve replacement 10/13/2009   History of CABG 11/05/2018   2005- 1. Coronary artery bypass grafting x4 with LIMA-LAD, SVG-OM1, SVG-AM and dCFX.  2. Aortic valve replacement with [employer] AVR 3. Septal myomectomy.  4. Myoview low risk 2016   History of hepatitis 01/31/1972   Hypertriglyceridemia 06/13/2010   Hypertrophic obstructive cardiomyopathy 05/06/2008   SP septal myomectomy at surgery 2005 Echo Nov 2018- Hyperdynamic LVEF with severe basal septal hypertrophy. There is chordal SAM with resting gradient of 16 mmHg that increases to 85 mmHg with Valsalva        Leg weakness, bilateral 01/18/2021   Long term (current) use of anticoagulants 05/22/2018   Lumbar stenosis with neurogenic claudication 02/04/2021   Malignant neoplasm of prostate 06/17/2009   Mechanical heart valve present    Manufacturer: Deitra Rosemary Blade #: [id number]  Model #: [id number]. Card states MRI compatible  with 3 teslas or less.   Obesity, unspecified 04/29/2008   OSA (obstructive sleep apnea) 07/12/2012   moderate-severe; uses CPAP nightly   PONV (postoperative nausea and vomiting)    after CABG- slow to wake up   Presence of prosthetic heart valve    Primary osteoarthritis of left shoulder 10/31/2018   S/P lumbar laminectomy 02/04/2021   Stage 3 chronic kidney disease due to type 2 diabetes mellitus 11/11/2018   Tobacco use 06/16/2009   Type 2 diabetes mellitus with hyperglycemia    Ventral hernia 03/03/2014    Surgeries: Procedure(s): ARTHROPLASTY, SHOULDER, TOTAL, REVERSE on 08/07/2023   Consultants (if any):   Discharged Condition: Improved  Hospital Course: [name] is an 78 y.o. male who was admitted 08/07/2023 with a diagnosis of S/P reverse total shoulder arthroplasty, left and went to the operating room on 08/07/2023 and underwent the above named procedures.    He was given perioperative antibiotics:  Anti-infectives (From admission, onward)    Start     Dose/Rate Route Frequency Ordered Stop   08/07/23 1615  ceFAZolin  (ANCEF ) IVPB 2g/100 mL premix        2 g 200 mL/hr over 30 Minutes Intravenous Every 6 hours 08/07/23 1519 08/07/23 2300   08/07/23 1207  vancomycin  (VANCOCIN ) powder  Status:  Discontinued          As needed 08/07/23 1207 08/07/23 1240   08/07/23 0815  ceFAZolin  (ANCEF ) IVPB 2g/100 mL premix        2 g 200 mL/hr over 30 Minutes Intravenous On call to O.R. 08/07/23 9187 08/07/23 1001     .  He was given sequential compression devices, early ambulation, and lovenox  bridge to coumadin  for DVT prophylaxis.  He benefited maximally from the hospital stay and there were no complications.    Recent vital signs:  Vitals:   08/08/23 0540 08/08/23 0905  BP: (!) 153/66 126/65  Pulse: 60 66  Resp: 18 16  Temp: 98 F (36.7 C) (!) 97.5 F (36.4 C)  SpO2: 100% 95%    Recent laboratory studies:  Lab Results  Component Value Date   HGB 12.2 (L)  08/08/2023   HGB 12.9 (L) 08/07/2023   HGB 14.3 07/18/2023   Lab Results  Component Value Date   WBC 9.9 08/08/2023   PLT 173 08/08/2023   Lab Results  Component Value Date   INR 1.0 08/08/2023   Lab Results  Component Value Date   NA 136 08/08/2023   K 3.8 08/08/2023   CL 104 08/08/2023   CO2 22 08/08/2023   BUN 24 (H) 08/08/2023   CREATININE 0.82 08/08/2023   GLUCOSE 150 (H) 08/08/2023    Discharge Medications:   Allergies as of 08/08/2023   No Known Allergies      Medication List     TAKE these medications    AMBULATORY NON FORMULARY MEDICATION cpap cushions            AirFit F20 (Size: Large)           AirFit F30 (Size: Med)   Please FAX the prescription to:  [phone]   aspirin  EC 81 MG tablet Take 1 tablet (81 mg total) by mouth daily. Swallow whole.   Basaglar  KwikPen 100 UNIT/ML INJECT 22 UNITS INTO THE SKIN DAILY. What changed:  how much to take when to take this   BD Pen Needle Nano 2nd Gen 32G X 4 MM Misc Generic drug: Insulin  Pen Needle 1 DEVICE BY DOES NOT APPLY ROUTE IN THE MORNING, AT NOON, IN THE EVENING, AND AT BEDTIME.   Blood Glucose Monitoring Suppl Supplies Misc Use for monitoring glucose level   chlorhexidine  4 % external liquid Commonly known as: HIBICLENS  Apply 15 mLs (1 Application total) topically as directed for 30 doses. Use as directed daily for 5 days every other week for 6 weeks.   Dexcom G6 Sensor Misc 1 Device by Does not apply route as directed.   enoxaparin  100 MG/ML injection Commonly known as: LOVENOX  Inject 1 mL (100 mg total) into the skin every 12 (twelve) hours.   Farxiga  10 MG Tabs tablet Generic drug: dapagliflozin  propanediol Take 1 tablet (10 mg total) by mouth daily before breakfast.   fenofibrate  145 MG tablet Commonly known as: TRICOR  TAKE 1 TABLET BY MOUTH EVERY DAY   furosemide  40 MG tablet Commonly known as: LASIX  Take 1 tablet (40 mg total) by mouth daily.   isosorbide  mononitrate  30 MG 24 hr tablet Commonly known as: IMDUR  TAKE 1 TABLET BY MOUTH EVERY DAY   ketoconazole 2 % cream Commonly known as: NIZORAL Apply 1 Application topically daily as needed for irritation.   methocarbamol  500 MG tablet Commonly known as: ROBAXIN  Take 1 tablet (500 mg total) by mouth every 8 (eight) hours as needed for muscle spasms.   metoprolol  succinate 25 MG 24 hr tablet Commonly known as: TOPROL -XL TAKE 1 TABLET BY MOUTH EVERY DAY   mupirocin  ointment 2 % Commonly known as: BACTROBAN  Place 1 Application into the nose 2 (two) times daily for 60 doses. Use as directed 2 times daily for 5 days every other week  for 6 weeks.   NovoLOG  FlexPen 100 UNIT/ML FlexPen Generic drug: insulin  aspart Max daily 75 units What changed:  how much to take how to take this when to take this additional instructions   ondansetron  4 MG tablet Commonly known as: Zofran  Take 1 tablet (4 mg total) by mouth every 8 (eight) hours as needed for nausea or vomiting.   OneTouch Delica Lancets 33G Misc USE AS DIRECTED   OneTouch Verio test strip Generic drug: glucose blood Use as instructed   oxyCODONE  5 MG immediate release tablet Commonly known as: Roxicodone  Take 1 tablet (5 mg total) by mouth every 4 (four) hours as needed for severe pain (pain score 7-10).   PRESCRIPTION MEDICATION CPAP- At bedtime   rosuvastatin  40 MG tablet Commonly known as: CRESTOR  TAKE 1 TABLET BY MOUTH EVERY DAY What changed: when to take this   Semaglutide  (2 MG/DOSE) 8 MG/3ML Sopn Inject 2 mg as directed once a week. What changed: when to take this   sennosides-docusate sodium  8.6-50 MG tablet Commonly known as: SENOKOT-S Take 2 tablets by mouth daily.   tacrolimus  0.1 % ointment Commonly known as: PROTOPIC  Apply 1 application  topically 2 (two) times daily as needed (for eczema).   warfarin 7.5 MG tablet Commonly known as: COUMADIN  Take as directed. If you are unsure how to take this medication,  talk to your nurse or doctor. Original instructions: TAKE 1/2 TABLET TO 1 TABLET BY MOUTH DAILY AS DIRECTED BY THE COUMADIN  CLINIC What changed: See the new instructions.        Diagnostic Studies: DG Shoulder Left Port Result Date: 08/07/2023 CLINICAL DATA:  Status post reverse shoulder arthroplasty. EXAM: LEFT SHOULDER COMPARISON:  Left shoulder radiograph dated 10/23/2022. FINDINGS: Total left shoulder arthroplasty. The arthroplasty components appear intact and in anatomic alignment. There is no acute fracture or dislocation. Postsurgical changes in the soft tissues of the left shoulder. IMPRESSION: Total left shoulder arthroplasty. Electronically Signed   By: Vanetta Chou M.D.   On: 08/07/2023 13:21   DG Chest 2 View Result Date: 07/18/2023 CLINICAL DATA:  Preop for shoulder surgery. EXAM: CHEST - 2 VIEW COMPARISON:  01/17/2023 x-ray and older FINDINGS: Underinflation. Sternal wires. Calcified and tortuous aorta. Normal cardiopericardial silhouette. No consolidation, pneumothorax or effusion. No edema. Degenerative changes along the spine. IMPRESSION: Postop chest.  No acute cardiopulmonary disease. Electronically Signed   By: Ranell Bring M.D.   On: 07/18/2023 14:13    Disposition: Discharge disposition: 01-Home or Self Care          Follow-up Information     Josefina Chew, MD. Go on 08/22/2023.   Specialty: Orthopedic Surgery Why: your appointment is scheduled for 11:00 Contact information: [address] [place] [address] [phone]         [facility] Follow up on 08/13/2023.   Why: your outpatient physical thearpy has been ordered. they will call you with the date and time Contact information: [phone]                 Signed: Army MARLA Daring PA-C 08/08/2023, 1:28 PM

## 2023-08-08 NOTE — Progress Notes (Signed)
   08/08/23 1035  TOC Brief Assessment  Insurance and Status Reviewed  Patient has primary care physician Yes  Home environment has been reviewed resides in private residence  Prior level of function: Independent  Prior/Current Home Services No current home services  Social Drivers of Health Review SDOH reviewed no interventions necessary  Readmission risk has been reviewed Yes  Transition of care needs no transition of care needs at this time

## 2023-08-08 NOTE — Plan of Care (Signed)
  Problem: Health Behavior/Discharge Planning: Goal: Ability to manage health-related needs will improve Outcome: Progressing   Problem: Clinical Measurements: Goal: Ability to maintain clinical measurements within normal limits will improve Outcome: Progressing Goal: Will remain free from infection Outcome: Progressing Goal: Diagnostic test results will improve Outcome: Progressing Goal: Respiratory complications will improve Outcome: Progressing Goal: Cardiovascular complication will be avoided Outcome: Progressing   Problem: Activity: Goal: Risk for activity intolerance will decrease Outcome: Progressing   Problem: Nutrition: Goal: Adequate nutrition will be maintained Outcome: Completed/Met   Problem: Coping: Goal: Level of anxiety will decrease Outcome: Progressing   Problem: Elimination: Goal: Will not experience complications related to bowel motility Outcome: Progressing Goal: Will not experience complications related to urinary retention Outcome: Completed/Met   Problem: Pain Managment: Goal: General experience of comfort will improve and/or be controlled Outcome: Progressing   Problem: Safety: Goal: Ability to remain free from injury will improve Outcome: Progressing   Problem: Skin Integrity: Goal: Risk for impaired skin integrity will decrease Outcome: Progressing   Problem: Education: Goal: Knowledge of the prescribed therapeutic regimen will improve Outcome: Progressing Goal: Understanding of activity limitations/precautions following surgery will improve Outcome: Progressing Goal: Individualized Educational Video(s) Outcome: Completed/Met   Problem: Activity: Goal: Ability to tolerate increased activity will improve Outcome: Progressing   Problem: Pain Management: Goal: Pain level will decrease with appropriate interventions Outcome: Progressing

## 2023-08-08 NOTE — Evaluation (Signed)
 Occupational Therapy Evaluation Patient Details Name: [name] MRN: [id number] DOB: 07-19-1945 Today's Date: 08/08/2023   History of Present Illness   78 y.o. male presents to [facility] hospital on 08/07/23 for Left rTSA.  H/o 2024 [facility] admission for Left frontal operculum infarct, adjacent to prior L MCA infarct. PMH includes CAD s/p CABG, aortic valve replacement, PAF, HLD, HTN.     Clinical Impressions Pt is a 78 year old male, s/p reverse shoulder replacement without functional use of non-dominant upper extremity secondary to effects of surgery and interscalene block and shoulder precautions. Therapist provided education and instruction to patient and spouse in regards to exercises, precautions, positioning, donning upper extremity clothing and bathing while maintaining shoulder precautions, ice and edema management and donning/doffing sling. Patient and spouse verbalized understanding and demonstrated as needed. Patient needed assistance to donn shirt, underwear, pants, socks and shoes and provided with instruction on compensatory strategies to perform ADLs. Patient limited by decreased ROM in LT shoulder so therefore will need some form of assistance at home. Patient and spouse verbalized and/or demonstrated understanding to all instruction. Patient to follow up with MD for further therapy needs.       If plan is discharge home, recommend the following:   A little help with walking and/or transfers;A little help with bathing/dressing/bathroom;Assistance with cooking/housework;Supervision due to cognitive status;Assist for transportation;Direct supervision/assist for medications management     Functional Status Assessment   Patient has had a recent decline in their functional status and demonstrates the ability to make significant improvements in function in a reasonable and predictable amount of time.     Equipment Recommendations   None recommended by OT     Recommendations for  Other Services         Precautions/Restrictions   Precautions Precautions: Shoulder Type of Shoulder Precautions: reverse TSA Precaution Booklet Issued: Yes (comment) Recall of Precautions/Restrictions: Impaired (Wife recalls and will be 24/7 supervising) Precaution/Restrictions Comments: Sling at all times except ADL/exercise Yes  Non weight bearing Yes  AROM elbow, wrist and hand to tolerance Yes  PROM of shoulder No  AROM of shoulder No Restrictions Weight Bearing Restrictions Per Provider Order: Yes LUE Weight Bearing Per Provider Order: Non weight bearing     Mobility Bed Mobility               General bed mobility comments: Received in recliner.    Transfers Overall transfer level: Needs assistance   Transfers: Bed to chair/wheelchair/BSC, Sit to/from Stand Sit to Stand: Supervision                  Balance Overall balance assessment: Mild deficits observed, not formally tested                                         ADL either performed or assessed with clinical judgement   ADL Overall ADL's : Needs assistance/impaired Eating/Feeding: Set up;Supervision/ safety   Grooming: Supervision/safety;Set up;Sitting;Cueing for UE precautions;Cueing for compensatory techniques;Cueing for sequencing;Cueing for safety;Adhering to UE precautions;With caregiver independent assisting Grooming Details (indicate cue type and reason): Pt demonstrated dangle method to apply deoderant Upper Body Bathing: Minimal assistance;Adhering to UE precautions;Sitting;Cueing for UE precautions;Cueing for compensatory techniques;Cueing for sequencing;Cueing for safety;With caregiver independent assisting   Lower Body Bathing: Cueing for sequencing;Cueing for safety;Cueing for compensatory techniques;With caregiver independent assisting   Upper Body Dressing : Moderate assistance;Cueing for UE  precautions;Sitting;Adhering to UE precautions;Cueing for compensatory  techniques;Cueing for sequencing;Cueing for safety;With caregiver independent assisting   Lower Body Dressing: Moderate assistance;Minimal assistance;With caregiver independent assisting;Cueing for compensatory techniques;Cueing for sequencing;Cueing for safety;Sitting/lateral leans;Sit to/from stand   Toilet Transfer: Contact guard assist;With caregiver independent assisting;Ambulation;Cueing for sequencing   Toileting- Clothing Manipulation and Hygiene: Minimal assistance;Cueing for compensatory techniques;Cueing for sequencing;Cueing for safety;With caregiver independent assisting       Functional mobility during ADLs: Supervision/safety;Contact guard assist;Cueing for sequencing;Cueing for safety;Caregiver able to provide necessary level of assistance       Vision Ability to See in Adequate Light: 0 Adequate Patient Visual Report: No change from baseline       Perception         Praxis         Pertinent Vitals/Pain Pain Assessment Pain Assessment: No/denies pain (still numb)     Extremity/Trunk Assessment Upper Extremity Assessment Upper Extremity Assessment: LUE deficits/detail;Right hand dominant;RUE deficits/detail RUE Deficits / Details: h/o tremors to RT hand since CVAs. Otherwise WFL LUE: Unable to fully assess due to immobilization   Lower Extremity Assessment Lower Extremity Assessment: Overall WFL for tasks assessed   Cervical / Trunk Assessment Cervical / Trunk Assessment: Normal   Communication Communication Factors Affecting Communication: Hearing impaired   Cognition Arousal: Alert Behavior During Therapy: WFL for tasks assessed/performed Cognition: History of cognitive impairments             OT - Cognition Comments: Remote CVAs with sibequent cognitive impairments in  setting of hearing loss. Spouse present and indicating understanding to all training.                         Cueing  General Comments          Exercises  Other Exercises Other Exercises: Pt and spouse educated on hand/wrist/forearm and elbow exercises with handout provided.   Shoulder Instructions Shoulder Instructions Donning/doffing shirt without moving shoulder: Moderate assistance;Caregiver independent with task Method for sponge bathing under operated UE: Supervision/safety;Caregiver independent with task Correct positioning of sling/immobilizer: Minimal assistance;Caregiver independent with task ROM for elbow, wrist and digits of operated UE: Caregiver independent with task Sling wearing schedule (on at all times/off for ADL's): Supervision/safety;Caregiver independent with task Proper positioning of operated UE when showering: Supervision/safety;Caregiver independent with task Positioning of UE while sleeping: Supervision/safety;Caregiver independent with task    Home Living Family/patient expects to be discharged to:: Private residence Living Arrangements: Spouse/significant other Available Help at Discharge: Available 24 hours/day;Family Type of Home: House Home Access: Level entry     Home Layout: One level     Bathroom Shower/Tub: Walk-in shower;Tub/shower unit   Bathroom Toilet: Handicapped height     Home Equipment: Rollator (4 wheels);Cane - single point;Grab bars - tub/shower;Grab bars - toilet;Shower seat;Hand held shower head          Prior Functioning/Environment Prior Level of Function : Independent/Modified Independent;Driving             Mobility Comments: Uses 3 pronge hurrycane in RT hand PRN. ADLs Comments: Wife assists with dressing since the arm pain began ~1 year.    OT Problem List: Impaired UE functional use   OT Treatment/Interventions:        OT Goals(Current goals can be found in the care plan section)   Acute Rehab OT Goals OT Goal Formulation: All assessment and education complete, DC therapy ADL Goals Pt/caregiver will Perform Home Exercise Program: Left upper  extremity;Increased ROM;With written  HEP provided (Hand to elbow.) Additional ADL Goal #1: Pt and/or spouse will demonstrate UE/LE dressing, donning/doffing of sling, correct positioning of LT UE, and compensatory strategies for LT axilla hygiene, as well as use of Ice man cryo cuff, while correctly following all shoulder post-op precautions/restrictions.   OT Frequency:       Co-evaluation              AM-PAC OT 6 Clicks Daily Activity     Outcome Measure Help from another person eating meals?: A Little Help from another person taking care of personal grooming?: A Little Help from another person toileting, which includes using toliet, bedpan, or urinal?: A Little Help from another person bathing (including washing, rinsing, drying)?: A Little Help from another person to put on and taking off regular upper body clothing?: A Lot Help from another person to put on and taking off regular lower body clothing?: A Little 6 Click Score: 17   End of Session Equipment Utilized During Treatment: Other (comment) (sling) Nurse Communication: Other (comment) (Pt ready to discharge home from OT perspective)  Activity Tolerance: Patient tolerated treatment well Patient left: in chair;with family/visitor present  OT Visit Diagnosis:  (decreased adls)                Time: 8982-8897 OT Time Calculation (min): 45 min Charges:  OT General Charges $OT Visit: 1 Visit OT Evaluation $OT Eval Low Complexity: 1 Low OT Treatments $Self Care/Home Management : 23-37 mins  Delon, OT Acute Rehab Services Office: [phone] 08/08/2023   Delon Falter 08/08/2023, 11:10 AM

## 2023-08-08 NOTE — Plan of Care (Signed)

## 2023-08-08 NOTE — Plan of Care (Signed)
  Problem: Health Behavior/Discharge Planning: Goal: Ability to manage health-related needs will improve 08/08/2023 0830 by Alaina Dozier PARAS, RN Outcome: Adequate for Discharge 08/08/2023 0748 by Alaina Dozier PARAS, RN Outcome: Progressing   Problem: Clinical Measurements: Goal: Ability to maintain clinical measurements within normal limits will improve 08/08/2023 0830 by Alaina Dozier PARAS, RN Outcome: Adequate for Discharge 08/08/2023 0748 by Alaina Dozier PARAS, RN Outcome: Progressing Goal: Will remain free from infection 08/08/2023 0830 by Alaina Dozier PARAS, RN Outcome: Adequate for Discharge 08/08/2023 0748 by Alaina Dozier PARAS, RN Outcome: Progressing Goal: Diagnostic test results will improve 08/08/2023 0830 by Alaina Dozier PARAS, RN Outcome: Adequate for Discharge 08/08/2023 0748 by Alaina Dozier PARAS, RN Outcome: Progressing Goal: Respiratory complications will improve 08/08/2023 0830 by Alaina Dozier PARAS, RN Outcome: Adequate for Discharge 08/08/2023 0748 by Alaina Dozier PARAS, RN Outcome: Progressing Goal: Cardiovascular complication will be avoided 08/08/2023 0830 by Alaina Dozier PARAS, RN Outcome: Adequate for Discharge 08/08/2023 0748 by Alaina Dozier PARAS, RN Outcome: Progressing   Problem: Activity: Goal: Risk for activity intolerance will decrease Outcome: Adequate for Discharge   Problem: Coping: Goal: Level of anxiety will decrease Outcome: Adequate for Discharge   Problem: Elimination: Goal: Will not experience complications related to bowel motility Outcome: Adequate for Discharge   Problem: Pain Managment: Goal: General experience of comfort will improve and/or be controlled Outcome: Adequate for Discharge   Problem: Safety: Goal: Ability to remain free from injury will improve Outcome: Adequate for Discharge   Problem: Skin Integrity: Goal: Risk for impaired skin integrity will decrease Outcome: Adequate for Discharge    Problem: Education: Goal: Ability to describe self-care measures that may prevent or decrease complications (Diabetes Survival Skills Education) will improve Outcome: Adequate for Discharge Goal: Individualized Educational Video(s) Outcome: Adequate for Discharge   Problem: Coping: Goal: Ability to adjust to condition or change in health will improve Outcome: Adequate for Discharge   Problem: Fluid Volume: Goal: Ability to maintain a balanced intake and output will improve Outcome: Adequate for Discharge   Problem: Health Behavior/Discharge Planning: Goal: Ability to identify and utilize available resources and services will improve Outcome: Adequate for Discharge Goal: Ability to manage health-related needs will improve Outcome: Adequate for Discharge   Problem: Metabolic: Goal: Ability to maintain appropriate glucose levels will improve Outcome: Adequate for Discharge   Problem: Nutritional: Goal: Maintenance of adequate nutrition will improve Outcome: Adequate for Discharge Goal: Progress toward achieving an optimal weight will improve Outcome: Adequate for Discharge   Problem: Skin Integrity: Goal: Risk for impaired skin integrity will decrease Outcome: Adequate for Discharge   Problem: Tissue Perfusion: Goal: Adequacy of tissue perfusion will improve Outcome: Adequate for Discharge   Problem: Education: Goal: Knowledge of the prescribed therapeutic regimen will improve Outcome: Adequate for Discharge Goal: Understanding of activity limitations/precautions following surgery will improve Outcome: Adequate for Discharge   Problem: Activity: Goal: Ability to tolerate increased activity will improve Outcome: Adequate for Discharge   Problem: Pain Management: Goal: Pain level will decrease with appropriate interventions Outcome: Adequate for Discharge

## 2023-08-08 NOTE — Progress Notes (Signed)
 Subjective: 1 Day Post-Op s/p Procedure(s): ARTHROPLASTY, SHOULDER, TOTAL, REVERSE   Patient is alert, oriented. Pain well controlled. No other complaints.    Objective:  PE: VITALS:   Vitals:   08/07/23 1850 08/07/23 1955 08/08/23 0113 08/08/23 0540  BP: 114/63 125/66 (!) 145/74 (!) 153/66  Pulse: 64 61 63 60  Resp: 16 18 18 18   Temp: 97.8 F (36.6 C) 97.9 F (36.6 C) 97.8 F (36.6 C) 98 F (36.7 C)  TempSrc: Oral Oral  Oral  SpO2: 96% 96% 100% 100%  Weight:      Height:        Neurovascular intact Sensation intact distally Incision: dressing C/D/I All fingers flex, extend, and abduct  LABS  Results for orders placed or performed during the hospital encounter of 08/07/23 (from the past 24 hours)  Glucose, capillary     Status: Abnormal   Collection Time: 08/07/23  8:33 AM  Result Value Ref Range   Glucose-Capillary 168 (H) 70 - 99 mg/dL   Comment 1 Notify RN    Comment 2 Document in Chart   Glucose, capillary     Status: Abnormal   Collection Time: 08/07/23 12:51 PM  Result Value Ref Range   Glucose-Capillary 175 (H) 70 - 99 mg/dL   Comment 1 Notify RN   CBC     Status: Abnormal   Collection Time: 08/07/23  3:35 PM  Result Value Ref Range   WBC 13.7 (H) 4.0 - 10.5 K/uL   RBC 4.40 4.22 - 5.81 MIL/uL   Hemoglobin 12.9 (L) 13.0 - 17.0 g/dL   HCT 60.5 60.9 - 47.9 %   MCV 89.5 80.0 - 100.0 fL   MCH 29.3 26.0 - 34.0 pg   MCHC 32.7 30.0 - 36.0 g/dL   RDW 85.7 88.4 - 84.4 %   Platelets 196 150 - 400 K/uL   nRBC 0.0 0.0 - 0.2 %  Creatinine, serum     Status: None   Collection Time: 08/07/23  3:35 PM  Result Value Ref Range   Creatinine, Ser 1.13 0.61 - 1.24 mg/dL   GFR, Estimated >39 >39 mL/min  Glucose, capillary     Status: Abnormal   Collection Time: 08/07/23  4:15 PM  Result Value Ref Range   Glucose-Capillary 164 (H) 70 - 99 mg/dL  Glucose, capillary     Status: Abnormal   Collection Time: 08/07/23  9:46 PM  Result Value Ref Range    Glucose-Capillary 185 (H) 70 - 99 mg/dL  Protime-INR     Status: None   Collection Time: 08/08/23  3:37 AM  Result Value Ref Range   Prothrombin Time 14.0 11.4 - 15.2 seconds   INR 1.0 0.8 - 1.2  CBC     Status: Abnormal   Collection Time: 08/08/23  3:37 AM  Result Value Ref Range   WBC 9.9 4.0 - 10.5 K/uL   RBC 4.12 (L) 4.22 - 5.81 MIL/uL   Hemoglobin 12.2 (L) 13.0 - 17.0 g/dL   HCT 62.0 (L) 60.9 - 47.9 %   MCV 92.0 80.0 - 100.0 fL   MCH 29.6 26.0 - 34.0 pg   MCHC 32.2 30.0 - 36.0 g/dL   RDW 85.7 88.4 - 84.4 %   Platelets 173 150 - 400 K/uL   nRBC 0.0 0.0 - 0.2 %  Basic metabolic panel     Status: Abnormal   Collection Time: 08/08/23  3:37 AM  Result Value Ref Range   Sodium 136 135 -  145 mmol/L   Potassium 3.8 3.5 - 5.1 mmol/L   Chloride 104 98 - 111 mmol/L   CO2 22 22 - 32 mmol/L   Glucose, Bld 150 (H) 70 - 99 mg/dL   BUN 24 (H) 8 - 23 mg/dL   Creatinine, Ser 9.17 0.61 - 1.24 mg/dL   Calcium  8.3 (L) 8.9 - 10.3 mg/dL   GFR, Estimated >39 >39 mL/min   Anion gap 10 5 - 15  Glucose, capillary     Status: Abnormal   Collection Time: 08/08/23  7:15 AM  Result Value Ref Range   Glucose-Capillary 133 (H) 70 - 99 mg/dL   *Note: Due to a large number of results and/or encounters for the requested time period, some results have not been displayed. A complete set of results can be found in Results Review.    DG Shoulder Left Port Result Date: 08/07/2023 CLINICAL DATA:  Status post reverse shoulder arthroplasty. EXAM: LEFT SHOULDER COMPARISON:  Left shoulder radiograph dated 10/23/2022. FINDINGS: Total left shoulder arthroplasty. The arthroplasty components appear intact and in anatomic alignment. There is no acute fracture or dislocation. Postsurgical changes in the soft tissues of the left shoulder. IMPRESSION: Total left shoulder arthroplasty. Electronically Signed   By: Vanetta Chou M.D.   On: 08/07/2023 13:21    Assessment/Plan: Principal Problem:   S/P reverse total  shoulder arthroplasty, left    1 Day Post-Op s/p Procedure(s): ARTHROPLASTY, SHOULDER, TOTAL, REVERSE  Weightbearing: NWB LUE Insicional and dressing care: Reinforce dressings as needed Orthopedic device(s): sling at all times VTE prophylaxis: bridging back to warfarin with lovenox  at this time Pain control: continue current regimen Follow - up plan: 2 weeks with Dr. Josefina Dispo: pending OT eval, then ok to discharge home   Contact information:   Army Daring, PA-C Weekdays 8-5  After hours and holidays please check [email or web address] for group call information for [facility]  Army MARLA Daring 08/08/2023, 8:10 AM

## 2023-08-09 ENCOUNTER — Encounter (HOSPITAL_COMMUNITY): Payer: Self-pay | Admitting: Orthopedic Surgery

## 2023-08-10 NOTE — Therapy (Signed)
 OUTPATIENT PHYSICAL THERAPY SHOULDER EVALUATION   Patient Name: [name] MRN: [id number] DOB:09-24-1945, 78 y.o., male Today's Date: 08/15/2023   END OF SESSION:  PT End of Session - 08/15/23 0938     Visit Number 1    Date for PT Re-Evaluation 11/07/23    Authorization Type [employer]    Progress Note Due on Visit 10    PT Start Time [phone]   Pt arrived late   PT Stop Time 1030    PT Time Calculation (min) 52 min    Activity Tolerance Patient tolerated treatment well    Behavior During Therapy [facility] for tasks assessed/performed          Past Medical History:  Diagnosis Date   Actinic keratosis 02/14/2011   Allergy    Aneurysm of ascending aorta without rupture (HCC) 01/06/2022   Aortic stenosis    Arthritis    Bilateral sensorineural hearing loss 06/16/2009   CAD (coronary artery disease)    cabg   Complication of anesthesia    Coronary atherosclerosis of native coronary artery 06/15/2009   Diabetic polyneuropathy 11/11/2018   Dyslipidemia 11/05/2018   Embolic stroke involving left middle cerebral artery  05/30/2021   Erectile dysfunction 05/18/2009   Essential hypertension 06/16/2009   Expressive aphasia 06/25/2021   External hemorrhoids 03/21/2010   Gout 04/09/2015   Heart murmur    Hemochromatosis 02/14/2011   History of aortic valve replacement 10/13/2009   History of CABG 11/05/2018   2005- 1. Coronary artery bypass grafting x4 with LIMA-LAD, SVG-OM1, SVG-AM and dCFX.  2. Aortic valve replacement with [employer] Jude AVR 3. Septal myomectomy.  4. Myoview low risk 2016   History of hepatitis 01/31/1972   Hypertriglyceridemia 06/13/2010   Hypertrophic obstructive cardiomyopathy 05/06/2008   SP septal myomectomy at surgery 2005 Echo Nov 2018- Hyperdynamic LVEF with severe basal septal hypertrophy. There is chordal SAM with resting gradient of 16 mmHg that increases to 85 mmHg with Valsalva        Leg weakness, bilateral 01/18/2021   Long term (current) use of  anticoagulants 05/22/2018   Lumbar stenosis with neurogenic claudication 02/04/2021   Malignant neoplasm of prostate 06/17/2009   Mechanical heart valve present    Manufacturer: Deitra Rosemary Blade #: [id number]  Model #: [id number]. Card states MRI compatible with 3 teslas or less.   Obesity, unspecified 04/29/2008   OSA (obstructive sleep apnea) 07/12/2012   moderate-severe; uses CPAP nightly   PONV (postoperative nausea and vomiting)    after CABG- slow to wake up   Presence of prosthetic heart valve    Primary osteoarthritis of left shoulder 10/31/2018   S/P lumbar laminectomy 02/04/2021   Stage 3 chronic kidney disease due to type 2 diabetes mellitus 11/11/2018   Tobacco use 06/16/2009   Type 2 diabetes mellitus with hyperglycemia    Ventral hernia 03/03/2014   Past Surgical History:  Procedure Laterality Date   AORTIC VALVE REPLACEMENT  11/14/2003   [facility]   APPENDECTOMY  1990   CHOLECYSTECTOMY  1990   CORONARY ANGIOGRAPHY  10/11/2021   CORONARY ARTERY BYPASS GRAFT  10/2003   HERNIA REPAIR  1999   right, inguinal   HERNIA REPAIR  2002   left, inguinal   HIP SURGERY  2006   right hip   IR CT HEAD LTD  02/03/2022   IR PERCUTANEOUS ART THROMBECTOMY/INFUSION INTRACRANIAL INC DIAG ANGIO  01/24/2022   LUMBAR LAMINECTOMY/DECOMPRESSION MICRODISCECTOMY Bilateral 02/04/2021   Procedure: Bilateral Lumbar Two-Three  Laminectomy;  Surgeon: Colon Shove, MD;  Location: [facility] OR;  Service: Neurosurgery;  Laterality: Bilateral;  3C/RM 20   PILONIDAL CYST EXCISION  1964   prostate seed implant  03/2010   RADIOLOGY WITH ANESTHESIA N/A 01/24/2022   Procedure: IR WITH ANESTHESIA;  Surgeon: Radiologist, Medication, MD;  Location: [facility] OR;  Service: Radiology;  Laterality: N/A;   REVERSE SHOULDER ARTHROPLASTY Left 08/07/2023   Procedure: ARTHROPLASTY, SHOULDER, TOTAL, REVERSE;  Surgeon: Josefina Chew, MD;  Location: [facility] ORS;  Service: Orthopedics;  Laterality: Left;   TEE WITHOUT CARDIOVERSION  N/A 03/14/2022   Procedure: TRANSESOPHAGEAL ECHOCARDIOGRAM (TEE);  Surgeon: Loni Soyla LABOR, MD;  Location: [facility] END[facility]COPY;  Service: Cardiology;  Laterality: N/A;   TONSILLECTOMY  childhood   Patient Active Problem List   Diagnosis Date Noted   S/P reverse total shoulder arthroplasty, left 08/07/2023   Pre-op evaluation 01/17/2023   Left shoulder pain 11/17/2022   Hypertrophic cardiomyopathy (HCC) 08/12/2022   Chronic anticoagulation 08/12/2022   Hypotension 04/27/2022   H/O: CVA (cerebrovascular accident) 03/14/2022   Urinary retention 01/26/2022   Acute ischemic stroke (HCC) 01/24/2022   Middle cerebral artery embolism, left 01/24/2022   Aneurysm of ascending aorta without rupture (HCC) 01/06/2022   Type 2 diabetes mellitus with stage 3a chronic kidney disease, with long-term current use of insulin  (HCC) 10/30/2021   Type 2 diabetes mellitus with diabetic polyneuropathy, with long-term current use of insulin  (HCC) 10/30/2021   NSTEMI (non-ST elevated myocardial infarction) (HCC) 10/09/2021   Paroxysmal atrial fibrillation (HCC) 10/09/2021   Presence of prosthetic heart valve    Type 2 diabetes mellitus with hyperglycemia    Expressive aphasia 06/25/2021   History of CVA (cerebrovascular accident) 05/2021   S/P lumbar laminectomy 02/04/2021   Lumbar stenosis with neurogenic claudication 02/04/2021   History of falling 01/30/2021   Diabetic polyneuropathy 11/11/2018   Stage 3 chronic kidney disease due to type 2 diabetes mellitus 11/11/2018   History of CABG 11/05/2018   Dyslipidemia 11/05/2018   Primary osteoarthritis, left shoulder 10/31/2018   Long term (current) use of anticoagulants 05/22/2018   Ventral hernia 03/03/2014   OSA (obstructive sleep apnea) 07/12/2012   Hemochromatosis 02/14/2011   Actinic keratosis 02/14/2011   Hypertriglyceridemia 06/13/2010   External hemorrhoids 03/21/2010   History of aortic valve replacement 10/13/2009   Malignant neoplasm of prostate  (HCC) 06/17/2009   Bilateral sensorineural hearing loss 06/16/2009   Essential hypertension 06/16/2009   Coronary atherosclerosis of native coronary artery 06/15/2009   Erectile dysfunction 05/18/2009   Obesity, unspecified 04/29/2008    PCP: Daryl Setter, NP   REFERRING PROVIDER: Josefina Chew, MD   REFERRING DIAG: [phone] (ICD-10-CM) - Status post reverse total shoulder replacement, left  THERAPY DIAG:  Stiffness of left shoulder, not elsewhere classified  Acute pain of left shoulder  Abnormal posture  Muscle weakness (generalized)  RATIONALE FOR EVALUATION AND TREATMENT: Rehabilitation  ONSET DATE: 08/07/2023 - L reverse TSA  NEXT MD VISIT: 08/22/23   SUBJECTIVE:  SUBJECTIVE STATEMENT: Pt is 8 days postop s/p L reverse TSA on 08/07/23.  Pt reports the block wore off on Thursday and since he has only had a mild ache at the incision.  Able to sleep in bed with pillow under arm.  Sling full time except for showering and exercises (done at time of showering).  PAIN: Are you having pain? Yes: NPRS scale: 0/10 currently, intermittent up to 3/10 Pain location: L shoulder  Pain description: ache  Aggravating factors: perturbation to arm  Relieving factors: ice, Tylenol  PRN   PERTINENT HISTORY:  Aneurysm of ascending aorta, aortic stenosis, B sensorineural hearing loss - extremely HOH, CAD s/p CABG, cardiomyopathy, AVR, chronic anticoagulation, DM-II with neuropathy, CVA of MCA, HTN, expressive aphasia, gout, lumbar stenosis s/p lumbar laminectomy and decompression 2023, shoulder OA, CKD, ventral hernia, hernia repair, hip surgery, hx multiple strokes (3-4), prostate cancer   PRECAUTIONS: Shoulder -  Avoid resisted IR/backward extension until 3 months post-op to protect  subscapularis repair Progress patient in scapular plane from PROM to AAROM to AROM ensuring proper kinematics throughout care to avoid over stress to deltoid/deltoid attachments   RED FLAGS: None  HAND DOMINANCE: Right  WEIGHT BEARING RESTRICTIONS: Yes Phase I (1-4 wks): <1 lb; Phase II-IV (4-12 wks): <5 lbs; Phase V (12-16 wks): advance to tolerance; Phase VI (16 wks to 6 mo): Full  FALLS:  Has patient fallen in last 6 months? Yes. Number of falls 1 - fell in garden in May  LIVING ENVIRONMENT:  Lives with: lives with their family Lives in: House/apartment Stairs: No Has following equipment at home: Environmental consultant - 2 wheeled, Crutches, and Grab bars  OCCUPATION: Retired  PLOF: Independent with household mobility without device, Independent with community mobility with device, Needs assistance with ADLs, and Leisure: gardening, 2-3 days/wk at [employer]  PATIENT GOALS: To use normal ROM (for my arm) as much as I can. To not be in pain all the time.   OBJECTIVE: (objective measures completed at initial evaluation unless otherwise dated)  DIAGNOSTIC FINDINGS:  08/07/23 - DG Left Shoulder: CLINICAL DATA:  Status post reverse shoulder arthroplasty.   FINDINGS: Total left shoulder arthroplasty. The arthroplasty components appear intact and in anatomic alignment. There is no acute fracture ordislocation. Postsurgical changes in the soft tissues of the leftshoulder.   IMPRESSION: Total left shoulder arthroplasty.  PATIENT SURVEYS:  Quick Dash: 68.2 / 100 = 68.2 % Minimally Clinically Important Difference (MCID): 15-20 points  COGNITION: Overall cognitive status: Within functional limits for tasks assessed     SENSATION: WFL  POSTURE: rounded shoulders and forward head  UPPER EXTREMITY ROM:   Passive ROM Left eval  Shoulder flexion 58  Shoulder extension   Shoulder abduction 50 scaption  Shoulder adduction   Shoulder internal rotation   Shoulder external rotation -6  from neutral    Active ROM Right  Left   Shoulder flexion    Shoulder extension    Shoulder abduction    Shoulder adduction    Shoulder internal rotation    Shoulder external rotation    Elbow flexion    Elbow extension    Wrist flexion    Wrist extension    Wrist ulnar deviation    Wrist radial deviation    Wrist pronation    Wrist supination    (Blank rows = not tested)  UPPER EXTREMITY MMT: (deferred on eval d/t only 1 week postop)  MMT Right  Left   Shoulder flexion    Shoulder extension  Shoulder abduction    Shoulder adduction    Shoulder internal rotation    Shoulder external rotation    Middle trapezius    Lower trapezius    Elbow flexion    Elbow extension    Wrist flexion    Wrist extension    Wrist ulnar deviation    Wrist radial deviation    Wrist pronation    Wrist supination    Grip strength (lbs)    (Blank rows = not tested)  JOINT MOBILITY TESTING:  Patient very guarded with initial PROM  PALPATION/OBSERVATION: Bruising draining into upper arm.  Skin at cubital fossa pale and slightly moist/macerated.    TODAY'S TREATMENT:   08/15/2023  SELF CARE:  Reviewed eval findings and role of PT in addressing identified deficits as well as instruction in initial HEP (see below).  Encouraged pt to spend a little more time out of sling while seated to allow for more air circulation at cubital fossa to prevent skin breakdown.   THERAPEUTIC EXERCISE: To improve ROM and flexibility.  Demonstration, verbal and tactile cues throughout for technique.  Seated L shoulder flexion towel slide on table x 10 - cues to use body lean to create motion at shoulder Seated L shoulder scaption towel slide on table x 10 - cues to use body lean to create motion at shoulder Seated L shoulder flexion P/AAROM flexion using hand on Swiffer post and fwd body lean x 10 Seated L shoulder flexion P/AAROM scaption using hand on Swiffer post and fwd body lean x 10 Seated L shoulder ER  P/AAROM with cane and hand in neutral working on shoulder ER to neutral x 10   PATIENT EDUCATION:  Education details: PT eval findings, anticipated POC, initial HEP, and monitoring of skin cubital fossa to prevent skin breakdown  Person educated: Patient and Spouse Education method: Explanation, Demonstration, Verbal cues, and Handouts Education comprehension: verbalized understanding, returned demonstration, verbal cues required, and needs further education  HOME EXERCISE PROGRAM: Access Code: [email or web address] URL: [email or web address] Date: 08/15/2023 Prepared by: Elijah Hidden  Exercises - Seated Shoulder Flexion Towel Slide at Table Top  - 2-3 x daily - 7 x weekly - 2 sets - 10 reps - 3 sec  hold - Seated Shoulder Scaption Slide at Table Top with Forearm in Neutral  - 2-3 x daily - 7 x weekly - 2 sets - 10 reps - 3 sec hold - Seated Shoulder External Rotation AAROM with Cane and Hand in Neutral (Mirrored)  - 2-3 x daily - 7 x weekly - 2 sets - 10 reps - 3 sec hold - Seated Shoulder Flexion Extension AAROM with Dowel into Wall  - 2-3 x daily - 7 x weekly - 2 sets - 10 reps - 3 sec hold   ASSESSMENT:  CLINICAL IMPRESSION: KRESTON AHRENDT is a 78 y.o. male who was referred to physical therapy for evaluation and treatment s/p L reverse TSA on 08/07/2023.  Patient reports nerve block wore off on POD #2 but pain has been mild (</= 3/10) since. Pain is worse with with perturbation to L UE.  Patient has deficits in L shoulder ROM and flexibility, B shoulder and postural strength, and abnormal posture which are interfering with ADLs and are impacting quality of life.  On QuickDASH patient scored 68.2/100 demonstrating 68.2% disability.  Potential for possible skin breakdown observed in L cubital fossa from moisture trapped while being in sling, therefore pt encouraged to spend a little more time out of the  sling while seated to allow for air circulation to skin.  Peggy will benefit from  skilled PT to address above deficits to improve mobility and activity tolerance with decreased pain interference.  OBJECTIVE IMPAIRMENTS: decreased activity tolerance, decreased knowledge of condition, decreased mobility, decreased ROM, decreased strength, increased fascial restrictions, impaired perceived functional ability, increased muscle spasms, impaired flexibility, improper body mechanics, postural dysfunction, and pain.   ACTIVITY LIMITATIONS: carrying, lifting, sleeping, bed mobility, bathing, toileting, dressing, self feeding, reach over head, and caring for others  PARTICIPATION LIMITATIONS: meal prep, cleaning, laundry, driving, and community activity  PERSONAL FACTORS: Age, Fitness, Past/current experiences, Time since onset of injury/illness/exacerbation, and 3+ comorbidities: Aneurysm of ascending aorta, aortic stenosis, B sensorineural hearing loss - extremely HOH, CAD s/p CABG, cardiomyopathy, AVR, chronic anticoagulation, DM-II with neuropathy, CVA of MCA, HTN, expressive aphasia, gout, lumbar stenosis s/p lumbar laminectomy and decompression 2023, shoulder OA, CKD, ventral hernia, hernia repair, hip surgery, hx multiple strokes (3-4), prostate cancer  are also affecting patient's functional outcome.   REHAB POTENTIAL: Good  CLINICAL DECISION MAKING: Evolving/moderate complexity  EVALUATION COMPLEXITY: Moderate   GOALS: Goals reviewed with patient? Yes  SHORT TERM GOALS: Target date: 09/26/2023  Patient will be independent with initial HEP to improve outcomes and carryover.  Baseline: Initial HEP provided on eval Goal status: INITIAL  2.  Patient will report 25% improvement in L shoulder pain to improve QOL.   Baseline: up to 3/10 Goal status: INITIAL  3.  Patient will improve L shoulder flexion and scaption to >/=90 to improve functional use of L UE. Baseline: Refer to above UE ROM table Goal status: INITIAL  LONG TERM GOALS: Target date: 11/07/2023  Patient will  be independent with ongoing/advanced HEP for self-management at home.  Baseline:  Goal status: INITIAL  2.  Patient will report 50-75% improvement in L shoulder pain to improve QOL.  Baseline: up to 3/10 Goal status: INITIAL  3.  Patient to demonstrate improved upright posture with posterior shoulder girdle engaged to promote improved glenohumeral joint mobility. Baseline:  Goal status: INITIAL  4.  Patient to improve L shoulder AROM to [facility] without pain provocation to allow for increased ease of ADLs.  Baseline: Refer to above UE ROM table Goal status: INITIAL  5.  Patient will demonstrate improved L shoulder strength to >/= 4/5 for functional UE use. Baseline: Refer to above UE MMT table Goal status: INITIAL  6  Patient will report </= 50% on QuickDASH  to demonstrate improved functional ability.  Baseline: 68.2 / 100 = 68.2 % Goal status: INITIAL   PLAN:  PT FREQUENCY: 1-2x/week - 1x/wk for rehab phase I (1-4 wks), 1-2x/wk thereafter  PT DURATION: 12 weeks  PLANNED INTERVENTIONS: 02835- PT Re-evaluation, 97750- Physical Performance Testing, 97110-Therapeutic exercises, 97530- Therapeutic activity, [id number]- Neuromuscular re-education, 97535- Self Care, 02859- Manual therapy, [id number]- Electrical stimulation (unattended), [id number]- Vasopneumatic device, 97035- Ultrasound, 02966- Ionotophoresis 4mg /ml Dexamethasone , Patient/Family education, Taping, Joint mobilization, Cryotherapy, and Moist heat  PLAN FOR NEXT SESSION: Per L reverse TSA protocol phase I (1-4 wks) - post-op week #1 as of 08/13/20 (surgery 08/07/23)  PRECAUTIONS: Shoulder -  Avoid resisted IR/backward extension until 3 months post-op to protect subscapularis repair Progress patient in scapular plane from PROM to AAROM to AROM ensuring proper kinematics throughout care to avoid over stress to deltoid/deltoid attachments    Elijah CHRISTELLA Hidden, PT 08/15/2023, 2:05 PM

## 2023-08-13 ENCOUNTER — Ambulatory Visit: Attending: Cardiology

## 2023-08-13 DIAGNOSIS — Z952 Presence of prosthetic heart valve: Secondary | ICD-10-CM | POA: Diagnosis not present

## 2023-08-13 DIAGNOSIS — Z7901 Long term (current) use of anticoagulants: Secondary | ICD-10-CM | POA: Diagnosis not present

## 2023-08-13 LAB — POCT INR: INR: 2.8 (ref 2.0–3.0)

## 2023-08-13 NOTE — Patient Instructions (Signed)
 Stop Lovenox  injections today. Take 1 tablet today only then Continue taking warfarin 1 tablet daily except 1/2 tablet on Mondays, Wednesdays, and Fridays. Remain consistent with leafy green (increase intake to 4 servings/week).  Recheck INR in 3 wks Coumadin  Clinic [phone].  Clearance Fax # [phone]

## 2023-08-13 NOTE — Progress Notes (Signed)
Please see anticoagulation encounter.

## 2023-08-15 ENCOUNTER — Other Ambulatory Visit: Payer: Self-pay

## 2023-08-15 ENCOUNTER — Ambulatory Visit: Attending: Orthopedic Surgery | Admitting: Physical Therapy

## 2023-08-15 ENCOUNTER — Encounter: Payer: Self-pay | Admitting: Physical Therapy

## 2023-08-15 DIAGNOSIS — M6281 Muscle weakness (generalized): Secondary | ICD-10-CM | POA: Diagnosis not present

## 2023-08-15 DIAGNOSIS — M25512 Pain in left shoulder: Secondary | ICD-10-CM | POA: Diagnosis not present

## 2023-08-15 DIAGNOSIS — R293 Abnormal posture: Secondary | ICD-10-CM | POA: Insufficient documentation

## 2023-08-15 DIAGNOSIS — M25612 Stiffness of left shoulder, not elsewhere classified: Secondary | ICD-10-CM | POA: Diagnosis not present

## 2023-08-22 ENCOUNTER — Other Ambulatory Visit: Payer: Self-pay | Admitting: Cardiology

## 2023-08-23 ENCOUNTER — Ambulatory Visit

## 2023-08-23 ENCOUNTER — Other Ambulatory Visit: Payer: Self-pay

## 2023-08-23 DIAGNOSIS — M25612 Stiffness of left shoulder, not elsewhere classified: Secondary | ICD-10-CM | POA: Diagnosis not present

## 2023-08-23 DIAGNOSIS — M6281 Muscle weakness (generalized): Secondary | ICD-10-CM

## 2023-08-23 DIAGNOSIS — M25512 Pain in left shoulder: Secondary | ICD-10-CM | POA: Diagnosis not present

## 2023-08-23 DIAGNOSIS — R293 Abnormal posture: Secondary | ICD-10-CM

## 2023-08-23 NOTE — Therapy (Signed)
 OUTPATIENT PHYSICAL THERAPY SHOULDER EVALUATION   Patient Name: [name] MRN: [id number] DOB:October 21, 1945, 78 y.o., male Today's Date: 08/23/2023   END OF SESSION:  PT End of Session - 08/23/23 0936     Visit Number 2    Date for PT Re-Evaluation 11/07/23    Authorization Type [employer]    Progress Note Due on Visit 10    PT Start Time 0930    PT Stop Time 1010    PT Time Calculation (min) 40 min    Activity Tolerance Patient tolerated treatment well    Behavior During Therapy [facility] for tasks assessed/performed           Past Medical History:  Diagnosis Date   Actinic keratosis 02/14/2011   Allergy    Aneurysm of ascending aorta without rupture (HCC) 01/06/2022   Aortic stenosis    Arthritis    Bilateral sensorineural hearing loss 06/16/2009   CAD (coronary artery disease)    cabg   Complication of anesthesia    Coronary atherosclerosis of native coronary artery 06/15/2009   Diabetic polyneuropathy 11/11/2018   Dyslipidemia 11/05/2018   Embolic stroke involving left middle cerebral artery  05/30/2021   Erectile dysfunction 05/18/2009   Essential hypertension 06/16/2009   Expressive aphasia 06/25/2021   External hemorrhoids 03/21/2010   Gout 04/09/2015   Heart murmur    Hemochromatosis 02/14/2011   History of aortic valve replacement 10/13/2009   History of CABG 11/05/2018   2005- 1. Coronary artery bypass grafting x4 with LIMA-LAD, SVG-OM1, SVG-AM and dCFX.  2. Aortic valve replacement with [employer] AVR 3. Septal myomectomy.  4. Myoview low risk 2016   History of hepatitis 01/31/1972   Hypertriglyceridemia 06/13/2010   Hypertrophic obstructive cardiomyopathy 05/06/2008   SP septal myomectomy at surgery 2005 Echo Nov 2018- Hyperdynamic LVEF with severe basal septal hypertrophy. There is chordal SAM with resting gradient of 16 mmHg that increases to 85 mmHg with Valsalva        Leg weakness, bilateral 01/18/2021   Long term (current) use of anticoagulants  05/22/2018   Lumbar stenosis with neurogenic claudication 02/04/2021   Malignant neoplasm of prostate 06/17/2009   Mechanical heart valve present    Manufacturer: Deitra Rosemary Blade #: [id number]  Model #: [id number]. Card states MRI compatible with 3 teslas or less.   Obesity, unspecified 04/29/2008   OSA (obstructive sleep apnea) 07/12/2012   moderate-severe; uses CPAP nightly   PONV (postoperative nausea and vomiting)    after CABG- slow to wake up   Presence of prosthetic heart valve    Primary osteoarthritis of left shoulder 10/31/2018   S/P lumbar laminectomy 02/04/2021   Stage 3 chronic kidney disease due to type 2 diabetes mellitus 11/11/2018   Tobacco use 06/16/2009   Type 2 diabetes mellitus with hyperglycemia    Ventral hernia 03/03/2014   Past Surgical History:  Procedure Laterality Date   AORTIC VALVE REPLACEMENT  11/14/2003   [employer] [facility]   APPENDECTOMY  1990   CHOLECYSTECTOMY  1990   CORONARY ANGIOGRAPHY  10/11/2021   CORONARY ARTERY BYPASS GRAFT  10/2003   HERNIA REPAIR  1999   right, inguinal   HERNIA REPAIR  2002   left, inguinal   HIP SURGERY  2006   right hip   IR CT HEAD LTD  02/03/2022   IR PERCUTANEOUS ART THROMBECTOMY/INFUSION INTRACRANIAL INC DIAG ANGIO  01/24/2022   LUMBAR LAMINECTOMY/DECOMPRESSION MICRODISCECTOMY Bilateral 02/04/2021   Procedure: Bilateral Lumbar Two-Three Laminectomy;  Surgeon:  Colon Shove, MD;  Location: [facility];  Service: Neurosurgery;  Laterality: Bilateral;  3C/RM 20   PILONIDAL CYST EXCISION  1964   prostate seed implant  03/2010   RADIOLOGY WITH ANESTHESIA N/A 01/24/2022   Procedure: IR WITH ANESTHESIA;  Surgeon: Radiologist, Medication, MD;  Location: [facility] OR;  Service: Radiology;  Laterality: N/A;   REVERSE SHOULDER ARTHROPLASTY Left 08/07/2023   Procedure: ARTHROPLASTY, SHOULDER, TOTAL, REVERSE;  Surgeon: Josefina Chew, MD;  Location: [facility] ORS;  Service: Orthopedics;  Laterality: Left;   TEE WITHOUT CARDIOVERSION N/A 03/14/2022    Procedure: TRANSESOPHAGEAL ECHOCARDIOGRAM (TEE);  Surgeon: Loni Soyla LABOR, MD;  Location: [facility] ENDOSCOPY;  Service: Cardiology;  Laterality: N/A;   TONSILLECTOMY  childhood   Patient Active Problem List   Diagnosis Date Noted   S/P reverse total shoulder arthroplasty, left 08/07/2023   Pre-op evaluation 01/17/2023   Left shoulder pain 11/17/2022   Hypertrophic cardiomyopathy (HCC) 08/12/2022   Chronic anticoagulation 08/12/2022   Hypotension 04/27/2022   H/O: CVA (cerebrovascular accident) 03/14/2022   Urinary retention 01/26/2022   Acute ischemic stroke (HCC) 01/24/2022   Middle cerebral artery embolism, left 01/24/2022   Aneurysm of ascending aorta without rupture (HCC) 01/06/2022   Type 2 diabetes mellitus with stage 3a chronic kidney disease, with long-term current use of insulin  (HCC) 10/30/2021   Type 2 diabetes mellitus with diabetic polyneuropathy, with long-term current use of insulin  (HCC) 10/30/2021   NSTEMI (non-ST elevated myocardial infarction) (HCC) 10/09/2021   Paroxysmal atrial fibrillation (HCC) 10/09/2021   Presence of prosthetic heart valve    Type 2 diabetes mellitus with hyperglycemia    Expressive aphasia 06/25/2021   History of CVA (cerebrovascular accident) 05/2021   S/P lumbar laminectomy 02/04/2021   Lumbar stenosis with neurogenic claudication 02/04/2021   History of falling 01/30/2021   Diabetic polyneuropathy 11/11/2018   Stage 3 chronic kidney disease due to type 2 diabetes mellitus 11/11/2018   History of CABG 11/05/2018   Dyslipidemia 11/05/2018   Primary osteoarthritis, left shoulder 10/31/2018   Long term (current) use of anticoagulants 05/22/2018   Ventral hernia 03/03/2014   OSA (obstructive sleep apnea) 07/12/2012   Hemochromatosis 02/14/2011   Actinic keratosis 02/14/2011   Hypertriglyceridemia 06/13/2010   External hemorrhoids 03/21/2010   History of aortic valve replacement 10/13/2009   Malignant neoplasm of prostate (HCC) 06/17/2009    Bilateral sensorineural hearing loss 06/16/2009   Essential hypertension 06/16/2009   Coronary atherosclerosis of native coronary artery 06/15/2009   Erectile dysfunction 05/18/2009   Obesity, unspecified 04/29/2008    PCP: Daryl Setter, NP   REFERRING PROVIDER: Josefina Chew, MD   REFERRING DIAG: [phone] (ICD-10-CM) - Status post reverse total shoulder replacement, left  THERAPY DIAG:  Stiffness of left shoulder, not elsewhere classified  Acute pain of left shoulder  Abnormal posture  Muscle weakness (generalized)  RATIONALE FOR EVALUATION AND TREATMENT: Rehabilitation  ONSET DATE: 08/07/2023 - L reverse TSA  NEXT MD VISIT: 08/22/23   SUBJECTIVE:  SUBJECTIVE STATEMENT: 08/23/23:saw PA in surgeon's office yesterday, they put steristrips on my shoulder.  I feel good, just can't reach back . IE: Pt is 8 days postop s/p L reverse TSA on 08/07/23.  Pt reports the block wore off on Thursday and since he has only had a mild ache at the incision.  Able to sleep in bed with pillow under arm.  Sling full time except for showering and exercises (done at time of showering).  PAIN: Are you having pain? Yes: NPRS scale: 0/10 currently, intermittent up to 3/10 Pain location: L shoulder  Pain description: ache  Aggravating factors: perturbation to arm  Relieving factors: ice, Tylenol  PRN   PERTINENT HISTORY:  Aneurysm of ascending aorta, aortic stenosis, B sensorineural hearing loss - extremely HOH, CAD s/p CABG, cardiomyopathy, AVR, chronic anticoagulation, DM-II with neuropathy, CVA of MCA, HTN, expressive aphasia, gout, lumbar stenosis s/p lumbar laminectomy and decompression 2023, shoulder OA, CKD, ventral hernia, hernia repair, hip surgery, hx multiple strokes (3-4), prostate cancer    PRECAUTIONS: Shoulder -  Avoid resisted IR/backward extension until 3 months post-op to protect subscapularis repair Progress patient in scapular plane from PROM to AAROM to AROM ensuring proper kinematics throughout care to avoid over stress to deltoid/deltoid attachments   RED FLAGS: None  HAND DOMINANCE: Right  WEIGHT BEARING RESTRICTIONS: Yes Phase I (1-4 wks): <1 lb; Phase II-IV (4-12 wks): <5 lbs; Phase V (12-16 wks): advance to tolerance; Phase VI (16 wks to 6 mo): Full  FALLS:  Has patient fallen in last 6 months? Yes. Number of falls 1 - fell in garden in May  LIVING ENVIRONMENT:  Lives with: lives with their family Lives in: House/apartment Stairs: No Has following equipment at home: Environmental consultant - 2 wheeled, Crutches, and Grab bars  OCCUPATION: Retired  PLOF: Independent with household mobility without device, Independent with community mobility with device, Needs assistance with ADLs, and Leisure: gardening, 2-3 days/wk at [employer]  PATIENT GOALS: To use normal ROM (for my arm) as much as I can. To not be in pain all the time.   OBJECTIVE: (objective measures completed at initial evaluation unless otherwise dated)  DIAGNOSTIC FINDINGS:  08/07/23 - DG Left Shoulder: CLINICAL DATA:  Status post reverse shoulder arthroplasty.   FINDINGS: Total left shoulder arthroplasty. The arthroplasty components appear intact and in anatomic alignment. There is no acute fracture ordislocation. Postsurgical changes in the soft tissues of the leftshoulder.   IMPRESSION: Total left shoulder arthroplasty.  PATIENT SURVEYS:  Quick Dash: 68.2 / 100 = 68.2 % Minimally Clinically Important Difference (MCID): 15-20 points  COGNITION: Overall cognitive status: Within functional limits for tasks assessed     SENSATION: WFL  POSTURE: rounded shoulders and forward head  UPPER EXTREMITY ROM:   Passive ROM Left eval  Shoulder flexion 58  Shoulder extension   Shoulder  abduction 50 scaption  Shoulder adduction   Shoulder internal rotation   Shoulder external rotation -6 from neutral    Active ROM Right  Left   Shoulder flexion    Shoulder extension    Shoulder abduction    Shoulder adduction    Shoulder internal rotation    Shoulder external rotation    Elbow flexion    Elbow extension    Wrist flexion    Wrist extension    Wrist ulnar deviation    Wrist radial deviation    Wrist pronation    Wrist supination    (Blank rows = not tested)  UPPER EXTREMITY MMT: (  deferred on eval d/t only 1 week postop)  MMT Right  Left   Shoulder flexion    Shoulder extension    Shoulder abduction    Shoulder adduction    Shoulder internal rotation    Shoulder external rotation    Middle trapezius    Lower trapezius    Elbow flexion    Elbow extension    Wrist flexion    Wrist extension    Wrist ulnar deviation    Wrist radial deviation    Wrist pronation    Wrist supination    Grip strength (lbs)    (Blank rows = not tested)  JOINT MOBILITY TESTING:  Patient very guarded with initial PROM  PALPATION/OBSERVATION: Bruising draining into upper arm.  Skin at cubital fossa pale and slightly moist/macerated.    TODAY'S TREATMENT:  08/23/23:  Manual and therapeutic exercise combined:  Supine with pillow under L humerus, retrograde massage L upper arm to reduce edema AAROM/PROM L shoulder flex, abd in scapular plane, ER multiple reps each Supine for L elbow extension /flexion, AAROM, L elbow ext with slight overpressure by PT  Side lying R for scapular clocks, therapist providing tactile cues/resistance medial border of scapula 5 rounds  Sitting and pt demonstrated table slides, needed correction for technique for the scaption tends to want to move into abduction and ext Seated L shoulder ER with cane   Instructed for home to engage in seated scapular retraction Instructed in semi reclined L elbow ext /flex to improve stiffness L elbow.     08/15/2023  SELF CARE:  Reviewed eval findings and role of PT in addressing identified deficits as well as instruction in initial HEP (see below).  Encouraged pt to spend a little more time out of sling while seated to allow for more air circulation at cubital fossa to prevent skin breakdown.   THERAPEUTIC EXERCISE: To improve ROM and flexibility.  Demonstration, verbal and tactile cues throughout for technique.  Seated L shoulder flexion towel slide on table x 10 - cues to use body lean to create motion at shoulder Seated L shoulder scaption towel slide on table x 10 - cues to use body lean to create motion at shoulder Seated L shoulder flexion P/AAROM flexion using hand on Swiffer post and fwd body lean x 10 Seated L shoulder flexion P/AAROM scaption using hand on Swiffer post and fwd body lean x 10 Seated L shoulder ER P/AAROM with cane and hand in neutral working on shoulder ER to neutral x 10   PATIENT EDUCATION:  Education details: PT eval findings, anticipated POC, initial HEP, and monitoring of skin cubital fossa to prevent skin breakdown  Person educated: Patient and Spouse Education method: Explanation, Demonstration, Verbal cues, and Handouts Education comprehension: verbalized understanding, returned demonstration, verbal cues required, and needs further education  HOME EXERCISE PROGRAM: Access Code: [email or web address] URL: [email or web address] Date: 08/15/2023 Prepared by: Elijah Hidden  Exercises - Seated Shoulder Flexion Towel Slide at Table Top  - 2-3 x daily - 7 x weekly - 2 sets - 10 reps - 3 sec  hold - Seated Shoulder Scaption Slide at Table Top with Forearm in Neutral  - 2-3 x daily - 7 x weekly - 2 sets - 10 reps - 3 sec hold - Seated Shoulder External Rotation AAROM with Cane and Hand in Neutral (Mirrored)  - 2-3 x daily - 7 x weekly - 2 sets - 10 reps - 3 sec hold - Seated Shoulder Flexion Extension AAROM with Dowel  into Wall  - 2-3 x daily - 7 x weekly - 2  sets - 10 reps - 3 sec hold   ASSESSMENT:  CLINICAL IMPRESSION:08/23/23: Returned today for second physical therapy appointment today following reverse TSA 2 weeks ago.  He improved his PROM for flex and abd as compared to initial visit.  Needed frequent redirection regarding avoiding L shoulder extension motion past neutral, he is quite frustrated that he cannot use his L hand for personal hygiene after bowel movement, also he reached back with L hand to brace his sitting down inadvertently.  Reviewed with he and his wife the stages of healing and rationale for following the recommended surgical protocol for movement restrictions. Overall he is progressing well, performing his home program very well.  Continues to benefit from skilled PT to address his deficits and goals.   EVAL:[name] is a 78 y.o. male who was referred to physical therapy for evaluation and treatment s/p L reverse TSA on 08/07/2023.  Patient reports nerve block wore off on POD #2 but pain has been mild (</= 3/10) since. Pain is worse with with perturbation to L UE.  Patient has deficits in L shoulder ROM and flexibility, B shoulder and postural strength, and abnormal posture which are interfering with ADLs and are impacting quality of life.  On QuickDASH patient scored 68.2/100 demonstrating 68.2% disability.  Potential for possible skin breakdown observed in L cubital fossa from moisture trapped while being in sling, therefore pt encouraged to spend a little more time out of the sling while seated to allow for air circulation to skin.  Ules will benefit from skilled PT to address above deficits to improve mobility and activity tolerance with decreased pain interference.  OBJECTIVE IMPAIRMENTS: decreased activity tolerance, decreased knowledge of condition, decreased mobility, decreased ROM, decreased strength, increased fascial restrictions, impaired perceived functional ability, increased muscle spasms, impaired flexibility,  improper body mechanics, postural dysfunction, and pain.   ACTIVITY LIMITATIONS: carrying, lifting, sleeping, bed mobility, bathing, toileting, dressing, self feeding, reach over head, and caring for others  PARTICIPATION LIMITATIONS: meal prep, cleaning, laundry, driving, and community activity  PERSONAL FACTORS: Age, Fitness, Past/current experiences, Time since onset of injury/illness/exacerbation, and 3+ comorbidities: Aneurysm of ascending aorta, aortic stenosis, B sensorineural hearing loss - extremely HOH, CAD s/p CABG, cardiomyopathy, AVR, chronic anticoagulation, DM-II with neuropathy, CVA of MCA, HTN, expressive aphasia, gout, lumbar stenosis s/p lumbar laminectomy and decompression 2023, shoulder OA, CKD, ventral hernia, hernia repair, hip surgery, hx multiple strokes (3-4), prostate cancer  are also affecting patient's functional outcome.   REHAB POTENTIAL: Good  CLINICAL DECISION MAKING: Evolving/moderate complexity  EVALUATION COMPLEXITY: Moderate   GOALS: Goals reviewed with patient? Yes  SHORT TERM GOALS: Target date: 09/26/2023  Patient will be independent with initial HEP to improve outcomes and carryover.  Baseline: Initial HEP provided on eval Goal status: INITIAL  2.  Patient will report 25% improvement in L shoulder pain to improve QOL.   Baseline: up to 3/10 Goal status: INITIAL  3.  Patient will improve L shoulder flexion and scaption to >/=90 to improve functional use of L UE. Baseline: Refer to above UE ROM table Goal status: INITIAL  LONG TERM GOALS: Target date: 11/07/2023  Patient will be independent with ongoing/advanced HEP for self-management at home.  Baseline:  Goal status: INITIAL  2.  Patient will report 50-75% improvement in L shoulder pain to improve QOL.  Baseline: up to 3/10 Goal status: INITIAL  3.  Patient to demonstrate  improved upright posture with posterior shoulder girdle engaged to promote improved glenohumeral joint  mobility. Baseline:  Goal status: INITIAL  4.  Patient to improve L shoulder AROM to [facility] without pain provocation to allow for increased ease of ADLs.  Baseline: Refer to above UE ROM table Goal status: INITIAL  5.  Patient will demonstrate improved L shoulder strength to >/= 4/5 for functional UE use. Baseline: Refer to above UE MMT table Goal status: INITIAL  6  Patient will report </= 50% on QuickDASH  to demonstrate improved functional ability.  Baseline: 68.2 / 100 = 68.2 % Goal status: INITIAL   PLAN:  PT FREQUENCY: 1-2x/week - 1x/wk for rehab phase I (1-4 wks), 1-2x/wk thereafter  PT DURATION: 12 weeks  PLANNED INTERVENTIONS: 02835- PT Re-evaluation, 97750- Physical Performance Testing, 97110-Therapeutic exercises, 97530- Therapeutic activity, [id number]- Neuromuscular re-education, 97535- Self Care, 02859- Manual therapy, [id number]- Electrical stimulation (unattended), [id number]- Vasopneumatic device, [id number]- Ultrasound, 02966- Ionotophoresis 4mg /ml Dexamethasone , Patient/Family education, Taping, Joint mobilization, Cryotherapy, and Moist heat  PLAN FOR NEXT SESSION: Per L reverse TSA protocol phase I (1-4 wks) - post-op week #1 as of 08/13/20 (surgery 08/07/23)  PRECAUTIONS: Shoulder -  Avoid resisted IR/backward extension until 3 months post-op to protect subscapularis repair Progress patient in scapular plane from PROM to AAROM to AROM ensuring proper kinematics throughout care to avoid over stress to deltoid/deltoid attachments    Tamea Bai L Ridgely Anastacio, PT, DPT, OCS 08/23/2023, 10:51 AM

## 2023-08-26 ENCOUNTER — Ambulatory Visit
Admission: EM | Admit: 2023-08-26 | Discharge: 2023-08-26 | Disposition: A | Discharge status: Home / Self Care | Attending: Family Medicine | Admitting: Family Medicine

## 2023-08-26 DIAGNOSIS — N3001 Acute cystitis with hematuria: Secondary | ICD-10-CM

## 2023-08-26 LAB — POCT URINALYSIS DIP (MANUAL ENTRY)
Bilirubin, UA: NEGATIVE
Glucose, UA: 500 mg/dL — AB
Ketones, POC UA: NEGATIVE mg/dL
Nitrite, UA: NEGATIVE
Protein Ur, POC: 100 mg/dL — AB
Spec Grav, UA: 1.015 (ref 1.010–1.025)
Urobilinogen, UA: 0.2 U/dL
pH, UA: 5 (ref 5.0–8.0)

## 2023-08-26 MED ORDER — CEPHALEXIN 500 MG PO CAPS: 500.0000 mg | ORAL_CAPSULE | Freq: Two times a day (BID) | ORAL | 0 refills | Status: DC

## 2023-08-26 NOTE — ED Triage Notes (Signed)
 Pt present with c/o dysuria yesterday afternoon. States he has had several UTI's before. Pt states he urinates frequently.

## 2023-08-26 NOTE — Discharge Instructions (Addendum)
 The clinical contact you with results of the urine culture done today are positive.  Start Keflex  twice daily for 7 days.  Please contact your Coumadin  clinic to make them aware your antibiotics as this can affect your INR levels.  Lots of rest and fluids.  Please follow-up with your PCP in 2 to 3 days for recheck.  Please go to the ER for any worsening symptoms.  Hope you feel better soon!

## 2023-08-26 NOTE — ED Provider Notes (Signed)
 [facility]    CSN: [id number] Arrival date & time: 08/26/23  1215      History   Chief Complaint Chief Complaint  Patient presents with   Dysuria    HPI [name] is a 78 y.o. male with a significant past medical history listed below presents for dysuria.  Patient reports 1 day of urinary burning with frequency and urgency.  Denies hematuria, fevers, nausea/vomiting, flank pain.  No penile discharge or testicular pain or swelling.  Does report a history of UTIs in the past.  He has not taken any OTC treatments for symptoms.  No other concerns at this time   Dysuria Presenting symptoms: dysuria     Past Medical History:  Diagnosis Date   Actinic keratosis 02/14/2011   Allergy    Aneurysm of ascending aorta without rupture (HCC) 01/06/2022   Aortic stenosis    Arthritis    Bilateral sensorineural hearing loss 06/16/2009   CAD (coronary artery disease)    cabg   Complication of anesthesia    Coronary atherosclerosis of native coronary artery 06/15/2009   Diabetic polyneuropathy 11/11/2018   Dyslipidemia 11/05/2018   Embolic stroke involving left middle cerebral artery  05/30/2021   Erectile dysfunction 05/18/2009   Essential hypertension 06/16/2009   Expressive aphasia 06/25/2021   External hemorrhoids 03/21/2010   Gout 04/09/2015   Heart murmur    Hemochromatosis 02/14/2011   History of aortic valve replacement 10/13/2009   History of CABG 11/05/2018   2005- 1. Coronary artery bypass grafting x4 with LIMA-LAD, SVG-OM1, SVG-AM and dCFX.  2. Aortic valve replacement with [employer] AVR 3. Septal myomectomy.  4. Myoview low risk 2016   History of hepatitis 01/31/1972   Hypertriglyceridemia 06/13/2010   Hypertrophic obstructive cardiomyopathy 05/06/2008   SP septal myomectomy at surgery 2005 Echo Nov 2018- Hyperdynamic LVEF with severe basal septal hypertrophy. There is chordal SAM with resting gradient of 16 mmHg that increases to 85 mmHg with Valsalva         Leg weakness, bilateral 01/18/2021   Long term (current) use of anticoagulants 05/22/2018   Lumbar stenosis with neurogenic claudication 02/04/2021   Malignant neoplasm of prostate 06/17/2009   Mechanical heart valve present    Manufacturer: Deitra Rosemary Blade #: [id number]  Model #: [id number]. Card states MRI compatible with 3 teslas or less.   Obesity, unspecified 04/29/2008   OSA (obstructive sleep apnea) 07/12/2012   moderate-severe; uses CPAP nightly   PONV (postoperative nausea and vomiting)    after CABG- slow to wake up   Presence of prosthetic heart valve    Primary osteoarthritis of left shoulder 10/31/2018   S/P lumbar laminectomy 02/04/2021   Stage 3 chronic kidney disease due to type 2 diabetes mellitus 11/11/2018   Tobacco use 06/16/2009   Type 2 diabetes mellitus with hyperglycemia    Ventral hernia 03/03/2014    Patient Active Problem List   Diagnosis Date Noted   S/P reverse total shoulder arthroplasty, left 08/07/2023   Pre-op evaluation 01/17/2023   Left shoulder pain 11/17/2022   Hypertrophic cardiomyopathy (HCC) 08/12/2022   Chronic anticoagulation 08/12/2022   Hypotension 04/27/2022   H/O: CVA (cerebrovascular accident) 03/14/2022   Urinary retention 01/26/2022   Acute ischemic stroke (HCC) 01/24/2022   Middle cerebral artery embolism, left 01/24/2022   Aneurysm of ascending aorta without rupture (HCC) 01/06/2022   Type 2 diabetes mellitus with stage 3a chronic kidney disease, with long-term current use of insulin  (HCC) 10/30/2021  Type 2 diabetes mellitus with diabetic polyneuropathy, with long-term current use of insulin  (HCC) 10/30/2021   NSTEMI (non-ST elevated myocardial infarction) (HCC) 10/09/2021   Paroxysmal atrial fibrillation (HCC) 10/09/2021   Presence of prosthetic heart valve    Type 2 diabetes mellitus with hyperglycemia    Expressive aphasia 06/25/2021   History of CVA (cerebrovascular accident) 05/2021   S/P lumbar laminectomy  02/04/2021   Lumbar stenosis with neurogenic claudication 02/04/2021   History of falling 01/30/2021   Diabetic polyneuropathy 11/11/2018   Stage 3 chronic kidney disease due to type 2 diabetes mellitus 11/11/2018   History of CABG 11/05/2018   Dyslipidemia 11/05/2018   Primary osteoarthritis, left shoulder 10/31/2018   Long term (current) use of anticoagulants 05/22/2018   Ventral hernia 03/03/2014   OSA (obstructive sleep apnea) 07/12/2012   Hemochromatosis 02/14/2011   Actinic keratosis 02/14/2011   Hypertriglyceridemia 06/13/2010   External hemorrhoids 03/21/2010   History of aortic valve replacement 10/13/2009   Malignant neoplasm of prostate (HCC) 06/17/2009   Bilateral sensorineural hearing loss 06/16/2009   Essential hypertension 06/16/2009   Coronary atherosclerosis of native coronary artery 06/15/2009   Erectile dysfunction 05/18/2009   Obesity, unspecified 04/29/2008    Past Surgical History:  Procedure Laterality Date   AORTIC VALVE REPLACEMENT  11/14/2003   [employer] [facility]   APPENDECTOMY  1990   CHOLECYSTECTOMY  1990   CORONARY ANGIOGRAPHY  10/11/2021   CORONARY ARTERY BYPASS GRAFT  10/2003   HERNIA REPAIR  1999   right, inguinal   HERNIA REPAIR  2002   left, inguinal   HIP SURGERY  2006   right hip   IR CT HEAD LTD  02/03/2022   IR PERCUTANEOUS ART THROMBECTOMY/INFUSION INTRACRANIAL INC DIAG ANGIO  01/24/2022   LUMBAR LAMINECTOMY/DECOMPRESSION MICRODISCECTOMY Bilateral 02/04/2021   Procedure: Bilateral Lumbar Two-Three Laminectomy;  Surgeon: Colon Shove, MD;  Location: [facility] OR;  Service: Neurosurgery;  Laterality: Bilateral;  3C/RM 20   PILONIDAL CYST EXCISION  1964   prostate seed implant  03/2010   RADIOLOGY WITH ANESTHESIA N/A 01/24/2022   Procedure: IR WITH ANESTHESIA;  Surgeon: Radiologist, Medication, MD;  Location: [facility] OR;  Service: Radiology;  Laterality: N/A;   REVERSE SHOULDER ARTHROPLASTY Left 08/07/2023   Procedure: ARTHROPLASTY, SHOULDER, TOTAL,  REVERSE;  Surgeon: Josefina Chew, MD;  Location: [facility] ORS;  Service: Orthopedics;  Laterality: Left;   TEE WITHOUT CARDIOVERSION N/A 03/14/2022   Procedure: TRANSESOPHAGEAL ECHOCARDIOGRAM (TEE);  Surgeon: Loni Soyla LABOR, MD;  Location: [facility] ENDOSCOPY;  Service: Cardiology;  Laterality: N/A;   TONSILLECTOMY  childhood       Home Medications    Prior to Admission medications   Medication Sig Start Date End Date Taking? Authorizing Provider  cephALEXin  (KEFLEX ) 500 MG capsule Take 1 capsule (500 mg total) by mouth 2 (two) times daily for 7 days. 08/26/23 09/02/23 Yes Loreda Myla SAUNDERS, NP  AMBULATORY NON FORMULARY MEDICATION cpap cushions            AirFit F20 (Size: Large)           AirFit F30 (Size: Med)   Please FAX the prescription to:  [phone] 02/10/20   Daryl Setter, NP  aspirin  EC 81 MG tablet Take 1 tablet (81 mg total) by mouth daily. Swallow whole. 04/14/22   Ghimire, Donalda HERO, MD  BD PEN NEEDLE NANO 2ND GEN 32G X 4 MM MISC 1 DEVICE BY DOES NOT APPLY ROUTE IN THE MORNING, AT NOON, IN THE EVENING, AND AT BEDTIME. 04/23/23  Shamleffer, Ibtehal Jaralla, MD  Blood Glucose Monitoring Suppl Supplies MISC Use for monitoring glucose level 08/05/18   Daryl Setter, NP  chlorhexidine  (HIBICLENS ) 4 % external liquid Apply 15 mLs (1 Application total) topically as directed for 30 doses. Use as directed daily for 5 days every other week for 6 weeks. 08/07/23   Brown, Blaine K, PA-C  Continuous Blood Gluc Sensor (DEXCOM G6 SENSOR) MISC 1 Device by Does not apply route as directed. 05/18/20   Shamleffer, Ibtehal Jaralla, MD  enoxaparin  (LOVENOX ) 100 MG/ML injection Inject 1 mL (100 mg total) into the skin every 12 (twelve) hours. Patient not taking: Reported on 08/15/2023 08/03/23   Pietro Redell RAMAN, MD  FARXIGA  10 MG TABS tablet Take 1 tablet (10 mg total) by mouth daily before breakfast. 07/17/23   Shamleffer, Ibtehal Jaralla, MD  fenofibrate  (TRICOR ) 145 MG tablet TAKE 1 TABLET BY MOUTH  EVERY DAY 07/12/23   Webb, Padonda B, FNP  furosemide  (LASIX ) 40 MG tablet Take 1 tablet (40 mg total) by mouth daily. 09/05/22 07/23/26  Pietro Redell RAMAN, MD  glucose blood (ONETOUCH VERIO) test strip Use as instructed 09/15/20   Daryl Setter, NP  insulin  aspart (NOVOLOG  FLEXPEN) 100 UNIT/ML FlexPen Max daily 75 units Patient taking differently: Inject 14-19 Units into the skin See admin instructions. Inject 16 units into the skin with breakfast, 14 units with lunch, and 16 units with supper/evening meal- may increase to up to 19 units per dose as needed for high blood sugar 02/13/22   Shamleffer, Ibtehal Jaralla, MD  Insulin  Glargine (BASAGLAR  KWIKPEN) 100 UNIT/ML INJECT 22 UNITS INTO THE SKIN DAILY. Patient taking differently: Inject 16 Units into the skin every evening. 06/19/23   Shamleffer, Ibtehal Jaralla, MD  isosorbide  mononitrate (IMDUR ) 30 MG 24 hr tablet TAKE 1 TABLET BY MOUTH EVERY DAY 08/22/23   Pietro Redell RAMAN, MD  ketoconazole (NIZORAL) 2 % cream Apply 1 Application topically daily as needed for irritation.    [provider]  methocarbamol  (ROBAXIN ) 500 MG tablet Take 1 tablet (500 mg total) by mouth every 8 (eight) hours as needed for muscle spasms. 08/08/23   Brown, Blaine K, PA-C  metoprolol  succinate (TOPROL -XL) 25 MG 24 hr tablet TAKE 1 TABLET BY MOUTH EVERY DAY 07/12/23   Webb, Padonda B, FNP  mupirocin  ointment (BACTROBAN ) 2 % Place 1 Application into the nose 2 (two) times daily for 60 doses. Use as directed 2 times daily for 5 days every other week for 6 weeks. 08/07/23 09/06/23  Brown, Blaine K, PA-C  ondansetron  (ZOFRAN ) 4 MG tablet Take 1 tablet (4 mg total) by mouth every 8 (eight) hours as needed for nausea or vomiting. 08/08/23   Brown, Blaine K, PA-C  OneTouch Delica Lancets 33G MISC USE AS DIRECTED 09/15/20   O'Sullivan, [name], NP  oxyCODONE  (ROXICODONE ) 5 MG immediate release tablet Take 1 tablet (5 mg total) by mouth every 4 (four) hours as needed for severe pain  (pain score 7-10). 08/08/23   Brown, Blaine K, PA-C  PRESCRIPTION MEDICATION CPAP- At bedtime    [provider]  rosuvastatin  (CRESTOR ) 40 MG tablet TAKE 1 TABLET BY MOUTH EVERY DAY Patient taking differently: Take 40 mg by mouth every evening. 09/12/22   Pietro Redell RAMAN, MD  Semaglutide , 2 MG/DOSE, 8 MG/3ML SOPN Inject 2 mg as directed once a week. Patient taking differently: Inject 2 mg as directed every Sunday. 05/22/23   O'Sullivan, [name], NP  sennosides-docusate sodium  (SENOKOT-S) 8.6-50 MG tablet Take 2 tablets  by mouth daily. Patient not taking: Reported on 08/15/2023 08/08/23   Brown, Blaine K, PA-C  tacrolimus  (PROTOPIC ) 0.1 % ointment Apply 1 application  topically 2 (two) times daily as needed (for eczema). 11/29/20   [provider]  warfarin (COUMADIN ) 7.5 MG tablet TAKE 1/2 TABLET TO 1 TABLET BY MOUTH DAILY AS DIRECTED BY THE COUMADIN  CLINIC Patient taking differently: Take 3.75-7.5 mg by mouth See admin instructions. Take 0.5 tablet (3.75 mg) by mouth on Mondays, Wednesdays & Fridays.  Take 1 tablet (7.5 mg) by mouth on Sundays, Tuesdays, Thursdays & Saturdays. 01/17/23   Pietro Redell RAMAN, MD    Family History Family History  Problem Relation Age of Onset   Heart disease Mother    Stroke Mother    Heart disease Father    Asperger's syndrome Son    Hyperlipidemia Son    Coronary artery disease Brother    Cancer Neg Hx        negative for colon cancer    Social History Social History   Tobacco Use   Smoking status: Former    Types: Cigars    Quit date: 06/2021    Years since quitting: 2.1   Smokeless tobacco: Never   Tobacco comments:    Pt states he smokes 1 cigar about once a month. 06/01/2021    Pt quit  Cigars 06/2021  Vaping Use   Vaping status: Never Used  Substance Use Topics   Alcohol use: Not Currently    Comment: wine maybe 1 per month   Drug use: No     Allergies   Patient has no known allergies.   Review of Systems Review of  Systems  Genitourinary:  Positive for dysuria.     Physical Exam Triage Vital Signs ED Triage Vitals  Encounter Vitals Group     BP 08/26/23 1245 (!) 160/85     Girls Systolic BP Percentile --      Girls Diastolic BP Percentile --      Boys Systolic BP Percentile --      Boys Diastolic BP Percentile --      Pulse Rate 08/26/23 1245 63     Resp 08/26/23 1245 17     Temp 08/26/23 1245 98 F (36.7 C)     Temp Source 08/26/23 1245 Oral     SpO2 08/26/23 1245 94 %     Weight --      Height --      Head Circumference --      Peak Flow --      Pain Score 08/26/23 1244 0     Pain Loc --      Pain Education --      Exclude from Growth Chart --    No data found.  Updated Vital Signs BP (!) 160/85 (BP Location: Left Arm)   Pulse 63   Temp 98 F (36.7 C) (Oral)   Resp 17   SpO2 94%   Visual Acuity Right Eye Distance:   Left Eye Distance:   Bilateral Distance:    Right Eye Near:   Left Eye Near:    Bilateral Near:     Physical Exam Vitals and nursing note reviewed.  Constitutional:      Appearance: Normal appearance.  HENT:     Head: Normocephalic and atraumatic.  Eyes:     Pupils: Pupils are equal, round, and reactive to light.  Cardiovascular:     Rate and Rhythm: Normal rate.  Pulmonary:  Effort: Pulmonary effort is normal.  Abdominal:     Tenderness: There is no right CVA tenderness or left CVA tenderness.  Skin:    General: Skin is warm and dry.  Neurological:     General: No focal deficit present.     Mental Status: He is alert and oriented to person, place, and time.  Psychiatric:        Mood and Affect: Mood normal.        Behavior: Behavior normal.      UC Treatments / Results  Labs (all labs ordered are listed, but only abnormal results are displayed) Labs Reviewed  POCT URINALYSIS DIP (MANUAL ENTRY) - Abnormal; Notable for the following components:      Result Value   Color, UA light yellow (*)    Clarity, UA cloudy (*)    Glucose, UA  =500 (*)    Blood, UA large (*)    Protein Ur, POC =100 (*)    Leukocytes, UA Large (3+) (*)    All other components within normal limits  URINE CULTURE   Basic metabolic panel Order: [id number]  Status: Final result     Next appt: 08/30/2023 at 09:30 AM in [facility] CHRISTELLA Hidden, PT)   Test Result Released: Yes (seen)   0 Result Notes     1 HM Topic          Component Ref Range & Units (hover) 2 wk ago (08/08/23) 2 wk ago (08/07/23) 1 mo ago (07/18/23) 2 mo ago (06/06/23) 3 mo ago (05/16/23) 6 mo ago (02/21/23) 7 mo ago (01/17/23)  Sodium 136  140 R 140 R 140 R 138 R 141 R  Potassium 3.8  3.6 R 4.1 R 3.3 Low  R 4.4 R 3.5 R  Chloride 104  101 R 98 R 100 R 101 R 99 R  CO2 22  31 R 22 R 29 R 30 R 31 R  Glucose, Bld 150 High   120 High  184 High  129 High  136 High  146 High   Comment: Glucose reference range applies only to samples taken after fasting for at least 8 hours.  BUN 24 High   32 High  R 37 High  R 31 High  R 33 High  R 36 High  R  Creatinine, Ser 0.82 1.13 1.43 R 1.26 R 1.47 R 1.53 High  R 1.88 High  R  Calcium  8.3 Low   9.3 R 9.6 R 9.0 R 9.4 R 9.0 R  GFR, Estimated >60 >60 CM       Comment: (NOTE) Calculated using the CKD-EPI Creatinine Equation (2021)  Anion gap 10        Comment: Performed at [facility], [address], [place], [place] [address]  Resulting Agency [facility] CLIN LAB [facility] CLIN LAB [place] [facility] [place] [facility] [place] [facility]        Specimen Collected: 08/08/23 03:37 Last Resulted: 08/08/23 04:33      EKG   Radiology No results found.  Procedures Procedures (including critical care time)  Medications Ordered in UC Medications - No data to display  Initial Impression / Assessment and Plan / UC Course  I have reviewed the triage vital signs and the nursing notes.  Pertinent labs & imaging results that were available during my care of the patient were reviewed by me and considered in my  medical decision making (see chart for details).     Reviewed exam  and symptoms with patient, no red flags.  UA positive for UTI, will culture and start Keflex  twice daily for 7 days.  Patient is on Coumadin  for prosthetic valve, he was instructed to contact Coumadin  clinic to make them aware he is on antibiotics as this can affect his INR and he verbalized understanding.  Discussed rest fluids and PCP follow-up 2 to 3 days for recheck.  ER precautions reviewed. Final Clinical Impressions(s) / UC Diagnoses   Final diagnoses:  Acute cystitis with hematuria     Discharge Instructions      The clinical contact you with results of the urine culture done today are positive.  Start Keflex  twice daily for 7 days.  Please contact your Coumadin  clinic to make them aware your antibiotics as this can affect your INR levels.  Lots of rest and fluids.  Please follow-up with your PCP in 2 to 3 days for recheck.  Please go to the ER for any worsening symptoms.  Hope you feel better soon!   ED Prescriptions     Medication Sig Dispense Auth. Provider   cephALEXin  (KEFLEX ) 500 MG capsule Take 1 capsule (500 mg total) by mouth 2 (two) times daily for 7 days. 14 capsule Deontae Robson, Jodi R, NP      PDMP not reviewed this encounter.   Loreda Myla SAUNDERS, NP 08/26/23 1257

## 2023-08-28 ENCOUNTER — Ambulatory Visit (HOSPITAL_COMMUNITY): Payer: Self-pay

## 2023-08-28 LAB — URINE CULTURE: Culture: 50000 — AB

## 2023-08-28 MED ORDER — CEFPODOXIME PROXETIL 100 MG PO TABS: 100.0000 mg | ORAL_TABLET | Freq: Two times a day (BID) | ORAL | 0 refills | Status: AC

## 2023-08-28 NOTE — Telephone Encounter (Signed)
 Keflex  will be inadequate for treatment.  Please advise patient to discontinue Keflex  and begin Vantin  100 mg twice daily for 7 days.  Thank you.

## 2023-08-29 ENCOUNTER — Telehealth: Payer: Self-pay

## 2023-08-29 ENCOUNTER — Encounter: Payer: Self-pay | Admitting: Family

## 2023-08-29 NOTE — Telephone Encounter (Signed)
 Provider spoke to patient earlier today about urgent care changing his medication. Patient will pick up and start taking today

## 2023-08-29 NOTE — Telephone Encounter (Signed)
 Copied from CRM [id number]. Topic: General - Call Back - No Documentation >> Aug 29, 2023  9:51 AM Rea BROCKS wrote: Reason for CRM: Patient's spouse called in wanting to receive a phone call from [facility] or [facility]. Patient sent a message through MyChart portal in regards to going to urgent care for UTI. Patient received antibiotics but is still having issues and they are asking what are next options or if another medicine can be prescribed. Patient explains more in detail in MyChart message and they would appreciate a phone call for next steps.  [phone] (H)

## 2023-08-29 NOTE — Telephone Encounter (Signed)
 Advised pt to start Vantin  which was sent by urgent care and let me know if symptoms don't continue improve.

## 2023-08-30 ENCOUNTER — Ambulatory Visit: Admitting: Physical Therapy

## 2023-08-30 ENCOUNTER — Encounter: Payer: Self-pay | Admitting: Physical Therapy

## 2023-08-30 DIAGNOSIS — M25512 Pain in left shoulder: Secondary | ICD-10-CM | POA: Diagnosis not present

## 2023-08-30 DIAGNOSIS — R293 Abnormal posture: Secondary | ICD-10-CM

## 2023-08-30 DIAGNOSIS — M6281 Muscle weakness (generalized): Secondary | ICD-10-CM | POA: Diagnosis not present

## 2023-08-30 DIAGNOSIS — M25612 Stiffness of left shoulder, not elsewhere classified: Secondary | ICD-10-CM | POA: Diagnosis not present

## 2023-08-30 NOTE — Therapy (Signed)
 OUTPATIENT PHYSICAL THERAPY TREATMENT   Patient Name: [name] MRN: [id number] DOB:1945/04/21, 78 y.o., male Today's Date: 08/30/2023   END OF SESSION:  PT End of Session - 08/30/23 0933     Visit Number 3    Date for PT Re-Evaluation 11/07/23    Authorization Type [employer]    Progress Note Due on Visit 10    PT Start Time 0933    PT Stop Time 1019    PT Time Calculation (min) 46 min    Activity Tolerance Patient tolerated treatment well    Behavior During Therapy [facility] for tasks assessed/performed            Past Medical History:  Diagnosis Date   Actinic keratosis 02/14/2011   Allergy    Aneurysm of ascending aorta without rupture (HCC) 01/06/2022   Aortic stenosis    Arthritis    Bilateral sensorineural hearing loss 06/16/2009   CAD (coronary artery disease)    cabg   Complication of anesthesia    Coronary atherosclerosis of native coronary artery 06/15/2009   Diabetic polyneuropathy 11/11/2018   Dyslipidemia 11/05/2018   Embolic stroke involving left middle cerebral artery  05/30/2021   Erectile dysfunction 05/18/2009   Essential hypertension 06/16/2009   Expressive aphasia 06/25/2021   External hemorrhoids 03/21/2010   Gout 04/09/2015   Heart murmur    Hemochromatosis 02/14/2011   History of aortic valve replacement 10/13/2009   History of CABG 11/05/2018   2005- 1. Coronary artery bypass grafting x4 with LIMA-LAD, SVG-OM1, SVG-AM and dCFX.  2. Aortic valve replacement with [employer] AVR 3. Septal myomectomy.  4. Myoview low risk 2016   History of hepatitis 01/31/1972   Hypertriglyceridemia 06/13/2010   Hypertrophic obstructive cardiomyopathy 05/06/2008   SP septal myomectomy at surgery 2005 Echo Nov 2018- Hyperdynamic LVEF with severe basal septal hypertrophy. There is chordal SAM with resting gradient of 16 mmHg that increases to 85 mmHg with Valsalva        Leg weakness, bilateral 01/18/2021   Long term (current) use of anticoagulants  05/22/2018   Lumbar stenosis with neurogenic claudication 02/04/2021   Malignant neoplasm of prostate 06/17/2009   Mechanical heart valve present    Manufacturer: Deitra Rosemary Blade #: [id number]  Model #: [id number]. Card states MRI compatible with 3 teslas or less.   Obesity, unspecified 04/29/2008   OSA (obstructive sleep apnea) 07/12/2012   moderate-severe; uses CPAP nightly   PONV (postoperative nausea and vomiting)    after CABG- slow to wake up   Presence of prosthetic heart valve    Primary osteoarthritis of left shoulder 10/31/2018   S/P lumbar laminectomy 02/04/2021   Stage 3 chronic kidney disease due to type 2 diabetes mellitus 11/11/2018   Tobacco use 06/16/2009   Type 2 diabetes mellitus with hyperglycemia    Ventral hernia 03/03/2014   Past Surgical History:  Procedure Laterality Date   AORTIC VALVE REPLACEMENT  11/14/2003   [facility]   APPENDECTOMY  1990   CHOLECYSTECTOMY  1990   CORONARY ANGIOGRAPHY  10/11/2021   CORONARY ARTERY BYPASS GRAFT  10/2003   HERNIA REPAIR  1999   right, inguinal   HERNIA REPAIR  2002   left, inguinal   HIP SURGERY  2006   right hip   IR CT HEAD LTD  02/03/2022   IR PERCUTANEOUS ART THROMBECTOMY/INFUSION INTRACRANIAL INC DIAG ANGIO  01/24/2022   LUMBAR LAMINECTOMY/DECOMPRESSION MICRODISCECTOMY Bilateral 02/04/2021   Procedure: Bilateral Lumbar Two-Three Laminectomy;  Surgeon:  Colon Shove, MD;  Location: [facility] OR;  Service: Neurosurgery;  Laterality: Bilateral;  3C/RM 20   PILONIDAL CYST EXCISION  1964   prostate seed implant  03/2010   RADIOLOGY WITH ANESTHESIA N/A 01/24/2022   Procedure: IR WITH ANESTHESIA;  Surgeon: Radiologist, Medication, MD;  Location: [facility] OR;  Service: Radiology;  Laterality: N/A;   REVERSE SHOULDER ARTHROPLASTY Left 08/07/2023   Procedure: ARTHROPLASTY, SHOULDER, TOTAL, REVERSE;  Surgeon: Josefina Chew, MD;  Location: [facility] ORS;  Service: Orthopedics;  Laterality: Left;   TEE WITHOUT CARDIOVERSION N/A 03/14/2022    Procedure: TRANSESOPHAGEAL ECHOCARDIOGRAM (TEE);  Surgeon: Loni Soyla LABOR, MD;  Location: [facility] ENDOSCOPY;  Service: Cardiology;  Laterality: N/A;   TONSILLECTOMY  childhood   Patient Active Problem List   Diagnosis Date Noted   S/P reverse total shoulder arthroplasty, left 08/07/2023   Pre-op evaluation 01/17/2023   Left shoulder pain 11/17/2022   Hypertrophic cardiomyopathy (HCC) 08/12/2022   Chronic anticoagulation 08/12/2022   Hypotension 04/27/2022   H/O: CVA (cerebrovascular accident) 03/14/2022   Urinary retention 01/26/2022   Acute ischemic stroke (HCC) 01/24/2022   Middle cerebral artery embolism, left 01/24/2022   Aneurysm of ascending aorta without rupture (HCC) 01/06/2022   Type 2 diabetes mellitus with stage 3a chronic kidney disease, with long-term current use of insulin  (HCC) 10/30/2021   Type 2 diabetes mellitus with diabetic polyneuropathy, with long-term current use of insulin  (HCC) 10/30/2021   NSTEMI (non-ST elevated myocardial infarction) (HCC) 10/09/2021   Paroxysmal atrial fibrillation (HCC) 10/09/2021   Presence of prosthetic heart valve    Type 2 diabetes mellitus with hyperglycemia    Expressive aphasia 06/25/2021   History of CVA (cerebrovascular accident) 05/2021   S/P lumbar laminectomy 02/04/2021   Lumbar stenosis with neurogenic claudication 02/04/2021   History of falling 01/30/2021   Diabetic polyneuropathy 11/11/2018   Stage 3 chronic kidney disease due to type 2 diabetes mellitus 11/11/2018   History of CABG 11/05/2018   Dyslipidemia 11/05/2018   Primary osteoarthritis, left shoulder 10/31/2018   Long term (current) use of anticoagulants 05/22/2018   Ventral hernia 03/03/2014   OSA (obstructive sleep apnea) 07/12/2012   Hemochromatosis 02/14/2011   Actinic keratosis 02/14/2011   Hypertriglyceridemia 06/13/2010   External hemorrhoids 03/21/2010   History of aortic valve replacement 10/13/2009   Malignant neoplasm of prostate (HCC) 06/17/2009    Bilateral sensorineural hearing loss 06/16/2009   Essential hypertension 06/16/2009   Coronary atherosclerosis of native coronary artery 06/15/2009   Erectile dysfunction 05/18/2009   Obesity, unspecified 04/29/2008    PCP: Daryl Setter, NP   REFERRING PROVIDER: Josefina Chew, MD   REFERRING DIAG: [phone] (ICD-10-CM) - Status post reverse total shoulder replacement, left  THERAPY DIAG:  Stiffness of left shoulder, not elsewhere classified  Acute pain of left shoulder  Abnormal posture  Muscle weakness (generalized)  RATIONALE FOR EVALUATION AND TREATMENT: Rehabilitation  ONSET DATE: 08/07/2023 - L reverse TSA  NEXT MD VISIT: 08/22/23   SUBJECTIVE:  SUBJECTIVE STATEMENT:  Pt reporting he is limited in L shoulder flexion AROM, demonstrating elevation to ~70-80 - reinforced that current ROM is only to be P/AAROM and that he should not be attempting unassisted AROM during this phase of his rehab.  IE: Pt is 8 days postop s/p L reverse TSA on 08/07/23.  Pt reports the block wore off on Thursday and since he has only had a mild ache at the incision.  Able to sleep in bed with pillow under arm.  Sling full time except for showering and exercises (done at time of showering).  PAIN: Are you having pain? Yes: NPRS scale: 0/10 currently, intermittent up to 3/10 Pain location: L shoulder  Pain description: ache  Aggravating factors: perturbation to arm  Relieving factors: ice, Tylenol  PRN   PERTINENT HISTORY:  Aneurysm of ascending aorta, aortic stenosis, B sensorineural hearing loss - extremely HOH, CAD s/p CABG, cardiomyopathy, AVR, chronic anticoagulation, DM-II with neuropathy, CVA of MCA, HTN, expressive aphasia, gout, lumbar stenosis s/p lumbar laminectomy and decompression  2023, shoulder OA, CKD, ventral hernia, hernia repair, hip surgery, hx multiple strokes (3-4), prostate cancer   PRECAUTIONS: Shoulder -  Avoid resisted IR/backward extension until 3 months post-op to protect subscapularis repair Progress patient in scapular plane from PROM to AAROM to AROM ensuring proper kinematics throughout care to avoid over stress to deltoid/deltoid attachments   RED FLAGS: None  HAND DOMINANCE: Right  WEIGHT BEARING RESTRICTIONS: Yes Phase I (1-4 wks): <1 lb; Phase II-IV (4-12 wks): <5 lbs; Phase V (12-16 wks): advance to tolerance; Phase VI (16 wks to 6 mo): Full  FALLS:  Has patient fallen in last 6 months? Yes. Number of falls 1 - fell in garden in May  LIVING ENVIRONMENT:  Lives with: lives with their family Lives in: House/apartment Stairs: No Has following equipment at home: Environmental consultant - 2 wheeled, Crutches, and Grab bars  OCCUPATION: Retired  PLOF: Independent with household mobility without device, Independent with community mobility with device, Needs assistance with ADLs, and Leisure: gardening, 2-3 days/wk at [employer]  PATIENT GOALS: To use normal ROM (for my arm) as much as I can. To not be in pain all the time.   OBJECTIVE: (objective measures completed at initial evaluation unless otherwise dated)  DIAGNOSTIC FINDINGS:  08/07/23 - DG Left Shoulder: CLINICAL DATA:  Status post reverse shoulder arthroplasty.   FINDINGS: Total left shoulder arthroplasty. The arthroplasty components appear intact and in anatomic alignment. There is no acute fracture ordislocation. Postsurgical changes in the soft tissues of the leftshoulder.   IMPRESSION: Total left shoulder arthroplasty.  PATIENT SURVEYS:  Quick Dash: 68.2 / 100 = 68.2 % Minimally Clinically Important Difference (MCID): 15-20 points  COGNITION: Overall cognitive status: Within functional limits for tasks assessed     SENSATION: WFL  POSTURE: rounded shoulders and forward  head  UPPER EXTREMITY ROM:   Passive ROM Left eval L 08/30/23  Shoulder flexion 58 90  Shoulder extension    Shoulder abduction 50 scaption 90 scaption  Shoulder adduction    Shoulder internal rotation    Shoulder external rotation -6 from neutral  24 at ~30 scaption   Active ROM Right  Left   Shoulder flexion    Shoulder extension    Shoulder abduction    Shoulder adduction    Shoulder internal rotation    Shoulder external rotation    Elbow flexion    Elbow extension    Wrist flexion    Wrist extension  Wrist ulnar deviation    Wrist radial deviation    Wrist pronation    Wrist supination    (Blank rows = not tested)  UPPER EXTREMITY MMT: (deferred on eval d/t only 1 week postop)  MMT Right  Left   Shoulder flexion    Shoulder extension    Shoulder abduction    Shoulder adduction    Shoulder internal rotation    Shoulder external rotation    Middle trapezius    Lower trapezius    Elbow flexion    Elbow extension    Wrist flexion    Wrist extension    Wrist ulnar deviation    Wrist radial deviation    Wrist pronation    Wrist supination    Grip strength (lbs)    (Blank rows = not tested)  JOINT MOBILITY TESTING:  Patient very guarded with initial PROM  PALPATION/OBSERVATION: Bruising draining into upper arm.  Skin at cubital fossa pale and slightly moist/macerated.    TODAY'S TREATMENT:   08/30/2023  THERAPEUTIC EXERCISE: To improve ROM and flexibility.  Demonstration, verbal and tactile cues throughout for technique. Seated L shoulder flexion towel slide on table x 10 - cues to use body lean to create motion at shoulder Seated L shoulder scaption towel slide on table x 10 - cues to use body lean to create motion at shoulder Seated L shoulder flexion P/AAROM flexion using hand on Swiffer post and fwd body lean x 10 Seated L shoulder flexion P/AAROM scaption using hand on Swiffer post and fwd body lean x 10 Supine L shoulder PROM flex, abd in  scapular plane, ER within limits of protocol x multiple reps each Supine with pillow under L humerus L elbow extension stretch & L elbow flex/ext AROM  MANUAL THERAPY: To promote normalized muscle tension, pain modulation, and reduced edema utilizing connective tissue massage and therapeutic massage. Supine with pillow under L humerus: retrograde massage L upper arm to reduce edema STM/DTM and manual TPR to reduce muscle tension in deltoids  NEUROMUSCULAR RE-EDUCATION: To improve kinesthesia and posture. Seated scapular retraction 10 x 5 - cues to avoid shoulder shrug and limit shoulder extensiont o neutral   SELF CARE: Provided education on PT POC progression, on post-surgical precautions, and ongoing limits of ROM per rehab protocol.  L shoulder PROM demonstrated for wife to assist at home.   08/23/23:  Manual and therapeutic exercise combined:  Supine with pillow under L humerus, retrograde massage L upper arm to reduce edema AAROM/PROM L shoulder flex, abd in scapular plane, ER multiple reps each Supine for L elbow extension /flexion, AAROM, L elbow ext with slight overpressure by PT  Side lying R for scapular clocks, therapist providing tactile cues/resistance medial border of scapula 5 rounds  Sitting and pt demonstrated table slides, needed correction for technique for the scaption tends to want to move into abduction and ext Seated L shoulder ER with cane  Instructed for home to engage in seated scapular retraction Instructed in semi reclined L elbow ext /flex to improve stiffness L elbow.    08/15/2023  SELF CARE:  Reviewed eval findings and role of PT in addressing identified deficits as well as instruction in initial HEP (see below).  Encouraged pt to spend a little more time out of sling while seated to allow for more air circulation at cubital fossa to prevent skin breakdown.   THERAPEUTIC EXERCISE: To improve ROM and flexibility.  Demonstration, verbal and tactile cues  throughout for technique.  Seated L shoulder flexion towel slide on table x 10 - cues to use body lean to create motion at shoulder Seated L shoulder scaption towel slide on table x 10 - cues to use body lean to create motion at shoulder Seated L shoulder flexion P/AAROM flexion using hand on Swiffer post and fwd body lean x 10 Seated L shoulder flexion P/AAROM scaption using hand on Swiffer post and fwd body lean x 10 Seated L shoulder ER P/AAROM with cane and hand in neutral working on shoulder ER to neutral x 10   PATIENT EDUCATION:  Education details: HEP review, HEP update - scap retraction, and postural awareness  Person educated: Patient and Spouse Education method: Explanation, Demonstration, Verbal cues, and MedBridgeGO app updated Education comprehension: verbalized understanding, returned demonstration, verbal cues required, and needs further education  HOME EXERCISE PROGRAM: *Pt using MedBridgeGO app  Access Code: [email or web address] URL: [email or web address] Date: 08/30/2023 Prepared by: Elijah Hidden  Exercises - Seated Shoulder Flexion Towel Slide at Table Top  - 2-3 x daily - 7 x weekly - 2 sets - 10 reps - 3 sec  hold - Seated Shoulder Scaption Slide at Table Top with Forearm in Neutral  - 2-3 x daily - 7 x weekly - 2 sets - 10 reps - 3 sec hold - Seated Shoulder External Rotation AAROM with Cane and Hand in Neutral (Mirrored)  - 2-3 x daily - 7 x weekly - 2 sets - 10 reps - 3 sec hold - Seated Shoulder Flexion Extension AAROM with Dowel into Wall  - 2-3 x daily - 7 x weekly - 2 sets - 10 reps - 3 sec hold - Seated Scapular Retraction  - 2 x daily - 7 x weekly - 2 sets - 10 reps - 3-5 sec hold   ASSESSMENT:  CLINICAL IMPRESSION: [name] arrives to PT noting limitation in L shoulder AROM, with PT having to remind him that he is only to be attempting P/AAROM at this phase of his rehab (3 weeks post-op).  Reviewed PROM and demonstrated technique for his wife to  assist at home.  Also reviewed HEP P/AAROM exercises clarifying ROM limitations per the protocol at this stage (especially avoiding extension and behind the back IR), cautioning patient not to force ROM to avoid creating increased irritation/inflammation which would delay the healing process.  Current ROM at levels expected for this stage of the post-op rehab protocol.  [name] will benefit from continued skilled PT to address ongoing ROM and strength deficits to improve mobility and activity tolerance with decreased pain interference.   EVAL:Kymir E Jaso is a 78 y.o. male who was referred to physical therapy for evaluation and treatment s/p L reverse TSA on 08/07/2023.  Patient reports nerve block wore off on POD #2 but pain has been mild (</= 3/10) since. Pain is worse with with perturbation to L UE.  Patient has deficits in L shoulder ROM and flexibility, B shoulder and postural strength, and abnormal posture which are interfering with ADLs and are impacting quality of life.  On QuickDASH patient scored 68.2/100 demonstrating 68.2% disability.  Potential for possible skin breakdown observed in L cubital fossa from moisture trapped while being in sling, therefore pt encouraged to spend a little more time out of the sling while seated to allow for air circulation to skin.  Racer will benefit from skilled PT to address above deficits to improve mobility and activity tolerance with decreased pain interference.  OBJECTIVE IMPAIRMENTS: decreased activity tolerance, decreased knowledge of  condition, decreased mobility, decreased ROM, decreased strength, increased fascial restrictions, impaired perceived functional ability, increased muscle spasms, impaired flexibility, improper body mechanics, postural dysfunction, and pain.   ACTIVITY LIMITATIONS: carrying, lifting, sleeping, bed mobility, bathing, toileting, dressing, self feeding, reach over head, and caring for others  PARTICIPATION LIMITATIONS: meal  prep, cleaning, laundry, driving, and community activity  PERSONAL FACTORS: Age, Fitness, Past/current experiences, Time since onset of injury/illness/exacerbation, and 3+ comorbidities: Aneurysm of ascending aorta, aortic stenosis, B sensorineural hearing loss - extremely HOH, CAD s/p CABG, cardiomyopathy, AVR, chronic anticoagulation, DM-II with neuropathy, CVA of MCA, HTN, expressive aphasia, gout, lumbar stenosis s/p lumbar laminectomy and decompression 2023, shoulder OA, CKD, ventral hernia, hernia repair, hip surgery, hx multiple strokes (3-4), prostate cancer  are also affecting patient's functional outcome.   REHAB POTENTIAL: Good  CLINICAL DECISION MAKING: Evolving/moderate complexity  EVALUATION COMPLEXITY: Moderate   GOALS: Goals reviewed with patient? Yes  SHORT TERM GOALS: Target date: 09/26/2023  Patient will be independent with initial HEP to improve outcomes and carryover.  Baseline: Initial HEP provided on eval Goal status: IN PROGRESS - 08/30/23 - reviewed HEP and reinforced only P/AAROM at this phase of rehab protocol  2.  Patient will report 25% improvement in L shoulder pain to improve QOL.   Baseline: up to 3/10 Goal status: INITIAL  3.  Patient will improve L shoulder flexion and scaption to >/=90 to improve functional use of L UE. Baseline: Refer to above UE ROM table Goal status: IN PROGRESS - 08/30/23 - L shoulder flexion & scaption PROM to 90  LONG TERM GOALS: Target date: 11/07/2023  Patient will be independent with ongoing/advanced HEP for self-management at home.  Baseline:  Goal status: INITIAL  2.  Patient will report 50-75% improvement in L shoulder pain to improve QOL.  Baseline: up to 3/10 Goal status: INITIAL  3.  Patient to demonstrate improved upright posture with posterior shoulder girdle engaged to promote improved glenohumeral joint mobility. Baseline:  Goal status: INITIAL  4.  Patient to improve L shoulder AROM to [facility] without pain  provocation to allow for increased ease of ADLs.  Baseline: Refer to above UE ROM table Goal status: INITIAL  5.  Patient will demonstrate improved L shoulder strength to >/= 4/5 for functional UE use. Baseline: Refer to above UE MMT table Goal status: INITIAL  6  Patient will report </= 50% on QuickDASH  to demonstrate improved functional ability.  Baseline: 68.2 / 100 = 68.2 % Goal status: INITIAL    PLAN:  PT FREQUENCY: 1-2x/week - 1x/wk for rehab phase I (1-4 wks), 1-2x/wk thereafter  PT DURATION: 12 weeks  PLANNED INTERVENTIONS: 02835- PT Re-evaluation, 97750- Physical Performance Testing, 97110-Therapeutic exercises, 97530- Therapeutic activity, [id number]- Neuromuscular re-education, 97535- Self Care, 02859- Manual therapy, G0283- Electrical stimulation (unattended), 97016- Vasopneumatic device, [id number]- Ultrasound, 02966- Ionotophoresis 4mg /ml Dexamethasone , Patient/Family education, Taping, Joint mobilization, Cryotherapy, and Moist heat  PLAN FOR NEXT SESSION: Gradually progress into L reverse TSA protocol phase II (4-6 wks) - post-op week #4 as of 09/03/20 (surgery 08/07/23)  PRECAUTIONS: Shoulder -  Avoid resisted IR/backward extension until 3 months post-op to protect subscapularis repair Progress patient in scapular plane from PROM to AAROM to AROM ensuring proper kinematics throughout care to avoid over stress to deltoid/deltoid attachments    Elijah CHRISTELLA Hidden, PT 08/30/2023, 12:35 PM

## 2023-08-31 ENCOUNTER — Telehealth: Payer: Self-pay

## 2023-08-31 NOTE — Telephone Encounter (Signed)
 I spoke to patient's wife regarding his recent start of Vantin  100 mg bid for 7 days.  I told her to have patient eat greens over the weekend and we will see him Monday for his INR appt.  She verbalized understanding.

## 2023-09-03 ENCOUNTER — Ambulatory Visit: Attending: Cardiology

## 2023-09-03 DIAGNOSIS — Z7901 Long term (current) use of anticoagulants: Secondary | ICD-10-CM

## 2023-09-03 DIAGNOSIS — Z952 Presence of prosthetic heart valve: Secondary | ICD-10-CM | POA: Diagnosis not present

## 2023-09-03 LAB — POCT INR: INR: 3.8 — AB (ref 2.0–3.0)

## 2023-09-03 NOTE — Patient Instructions (Signed)
 Continue taking warfarin 1 tablet daily except 1/2 tablet on Mondays, Wednesdays, and Fridays.  Eat greens tonight.  Remain consistent with leafy green (increase intake to 4 servings/week).  Recheck INR in 4 wks Coumadin  Clinic [phone].  Clearance Fax # [phone]

## 2023-09-03 NOTE — Progress Notes (Signed)
 INR 3.8; Please see anticoagulation encounter

## 2023-09-06 ENCOUNTER — Ambulatory Visit: Attending: Orthopedic Surgery

## 2023-09-06 DIAGNOSIS — M6281 Muscle weakness (generalized): Secondary | ICD-10-CM | POA: Diagnosis not present

## 2023-09-06 DIAGNOSIS — M25512 Pain in left shoulder: Secondary | ICD-10-CM | POA: Diagnosis not present

## 2023-09-06 DIAGNOSIS — R293 Abnormal posture: Secondary | ICD-10-CM | POA: Diagnosis not present

## 2023-09-06 DIAGNOSIS — M25612 Stiffness of left shoulder, not elsewhere classified: Secondary | ICD-10-CM | POA: Diagnosis not present

## 2023-09-06 NOTE — Therapy (Signed)
 OUTPATIENT PHYSICAL THERAPY TREATMENT   Patient Name: [name] MRN: [id number] DOB:1945-08-28, 78 y.o., male Today's Date: 09/06/2023   END OF SESSION:  PT End of Session - 09/06/23 1028     Visit Number 4    Date for PT Re-Evaluation 11/07/23    Authorization Type [employer]    Progress Note Due on Visit 10    PT Start Time 0935    PT Stop Time 1020    PT Time Calculation (min) 45 min    Activity Tolerance Patient tolerated treatment well    Behavior During Therapy [facility] for tasks assessed/performed             Past Medical History:  Diagnosis Date   Actinic keratosis 02/14/2011   Allergy    Aneurysm of ascending aorta without rupture (HCC) 01/06/2022   Aortic stenosis    Arthritis    Bilateral sensorineural hearing loss 06/16/2009   CAD (coronary artery disease)    cabg   Complication of anesthesia    Coronary atherosclerosis of native coronary artery 06/15/2009   Diabetic polyneuropathy 11/11/2018   Dyslipidemia 11/05/2018   Embolic stroke involving left middle cerebral artery  05/30/2021   Erectile dysfunction 05/18/2009   Essential hypertension 06/16/2009   Expressive aphasia 06/25/2021   External hemorrhoids 03/21/2010   Gout 04/09/2015   Heart murmur    Hemochromatosis 02/14/2011   History of aortic valve replacement 10/13/2009   History of CABG 11/05/2018   2005- 1. Coronary artery bypass grafting x4 with LIMA-LAD, SVG-OM1, SVG-AM and dCFX.  2. Aortic valve replacement with [employer] AVR 3. Septal myomectomy.  4. Myoview low risk 2016   History of hepatitis 01/31/1972   Hypertriglyceridemia 06/13/2010   Hypertrophic obstructive cardiomyopathy 05/06/2008   SP septal myomectomy at surgery 2005 Echo Nov 2018- Hyperdynamic LVEF with severe basal septal hypertrophy. There is chordal SAM with resting gradient of 16 mmHg that increases to 85 mmHg with Valsalva        Leg weakness, bilateral 01/18/2021   Long term (current) use of anticoagulants  05/22/2018   Lumbar stenosis with neurogenic claudication 02/04/2021   Malignant neoplasm of prostate 06/17/2009   Mechanical heart valve present    Manufacturer: Deitra Rosemary Blade #: [id number]  Model #: [id number]. Card states MRI compatible with 3 teslas or less.   Obesity, unspecified 04/29/2008   OSA (obstructive sleep apnea) 07/12/2012   moderate-severe; uses CPAP nightly   PONV (postoperative nausea and vomiting)    after CABG- slow to wake up   Presence of prosthetic heart valve    Primary osteoarthritis of left shoulder 10/31/2018   S/P lumbar laminectomy 02/04/2021   Stage 3 chronic kidney disease due to type 2 diabetes mellitus 11/11/2018   Tobacco use 06/16/2009   Type 2 diabetes mellitus with hyperglycemia    Ventral hernia 03/03/2014   Past Surgical History:  Procedure Laterality Date   AORTIC VALVE REPLACEMENT  11/14/2003   [facility]   APPENDECTOMY  1990   CHOLECYSTECTOMY  1990   CORONARY ANGIOGRAPHY  10/11/2021   CORONARY ARTERY BYPASS GRAFT  10/2003   HERNIA REPAIR  1999   right, inguinal   HERNIA REPAIR  2002   left, inguinal   HIP SURGERY  2006   right hip   IR CT HEAD LTD  02/03/2022   IR PERCUTANEOUS ART THROMBECTOMY/INFUSION INTRACRANIAL INC DIAG ANGIO  01/24/2022   LUMBAR LAMINECTOMY/DECOMPRESSION MICRODISCECTOMY Bilateral 02/04/2021   Procedure: Bilateral Lumbar Two-Three Laminectomy;  Surgeon: Colon Shove, MD;  Location: [facility] OR;  Service: Neurosurgery;  Laterality: Bilateral;  3C/RM 20   PILONIDAL CYST EXCISION  1964   prostate seed implant  03/2010   RADIOLOGY WITH ANESTHESIA N/A 01/24/2022   Procedure: IR WITH ANESTHESIA;  Surgeon: Radiologist, Medication, MD;  Location: [facility] OR;  Service: Radiology;  Laterality: N/A;   REVERSE SHOULDER ARTHROPLASTY Left 08/07/2023   Procedure: ARTHROPLASTY, SHOULDER, TOTAL, REVERSE;  Surgeon: Josefina Chew, MD;  Location: [facility] ORS;  Service: Orthopedics;  Laterality: Left;   TEE WITHOUT CARDIOVERSION N/A 03/14/2022    Procedure: TRANSESOPHAGEAL ECHOCARDIOGRAM (TEE);  Surgeon: Loni Soyla LABOR, MD;  Location: [facility] ENDOSCOPY;  Service: Cardiology;  Laterality: N/A;   TONSILLECTOMY  childhood   Patient Active Problem List   Diagnosis Date Noted   S/P reverse total shoulder arthroplasty, left 08/07/2023   Pre-op evaluation 01/17/2023   Left shoulder pain 11/17/2022   Hypertrophic cardiomyopathy (HCC) 08/12/2022   Chronic anticoagulation 08/12/2022   Hypotension 04/27/2022   H/O: CVA (cerebrovascular accident) 03/14/2022   Urinary retention 01/26/2022   Acute ischemic stroke (HCC) 01/24/2022   Middle cerebral artery embolism, left 01/24/2022   Aneurysm of ascending aorta without rupture (HCC) 01/06/2022   Type 2 diabetes mellitus with stage 3a chronic kidney disease, with long-term current use of insulin  (HCC) 10/30/2021   Type 2 diabetes mellitus with diabetic polyneuropathy, with long-term current use of insulin  (HCC) 10/30/2021   NSTEMI (non-ST elevated myocardial infarction) (HCC) 10/09/2021   Paroxysmal atrial fibrillation (HCC) 10/09/2021   Presence of prosthetic heart valve    Type 2 diabetes mellitus with hyperglycemia    Expressive aphasia 06/25/2021   History of CVA (cerebrovascular accident) 05/2021   S/P lumbar laminectomy 02/04/2021   Lumbar stenosis with neurogenic claudication 02/04/2021   History of falling 01/30/2021   Diabetic polyneuropathy 11/11/2018   Stage 3 chronic kidney disease due to type 2 diabetes mellitus 11/11/2018   History of CABG 11/05/2018   Dyslipidemia 11/05/2018   Primary osteoarthritis, left shoulder 10/31/2018   Long term (current) use of anticoagulants 05/22/2018   Ventral hernia 03/03/2014   OSA (obstructive sleep apnea) 07/12/2012   Hemochromatosis 02/14/2011   Actinic keratosis 02/14/2011   Hypertriglyceridemia 06/13/2010   External hemorrhoids 03/21/2010   History of aortic valve replacement 10/13/2009   Malignant neoplasm of prostate (HCC) 06/17/2009    Bilateral sensorineural hearing loss 06/16/2009   Essential hypertension 06/16/2009   Coronary atherosclerosis of native coronary artery 06/15/2009   Erectile dysfunction 05/18/2009   Obesity, unspecified 04/29/2008    PCP: Daryl Setter, NP   REFERRING PROVIDER: Josefina Chew, MD   REFERRING DIAG: [phone] (ICD-10-CM) - Status post reverse total shoulder replacement, left  THERAPY DIAG:  Stiffness of left shoulder, not elsewhere classified  Acute pain of left shoulder  Abnormal posture  Muscle weakness (generalized)  RATIONALE FOR EVALUATION AND TREATMENT: Rehabilitation  ONSET DATE: 08/07/2023 - L reverse TSA  NEXT MD VISIT: 08/22/23   SUBJECTIVE:  SUBJECTIVE STATEMENT:  Pt reports doing fine, no pain really unless turning over in bed.  IE: Pt is 8 days postop s/p L reverse TSA on 08/07/23.  Pt reports the block wore off on Thursday and since he has only had a mild ache at the incision.  Able to sleep in bed with pillow under arm.  Sling full time except for showering and exercises (done at time of showering).  PAIN: Are you having pain? Yes: NPRS scale: 0/10 currently, intermittent up to 3/10 Pain location: L shoulder  Pain description: ache  Aggravating factors: perturbation to arm  Relieving factors: ice, Tylenol  PRN   PERTINENT HISTORY:  Aneurysm of ascending aorta, aortic stenosis, B sensorineural hearing loss - extremely HOH, CAD s/p CABG, cardiomyopathy, AVR, chronic anticoagulation, DM-II with neuropathy, CVA of MCA, HTN, expressive aphasia, gout, lumbar stenosis s/p lumbar laminectomy and decompression 2023, shoulder OA, CKD, ventral hernia, hernia repair, hip surgery, hx multiple strokes (3-4), prostate cancer   PRECAUTIONS: Shoulder -  Avoid resisted  IR/backward extension until 3 months post-op to protect subscapularis repair Progress patient in scapular plane from PROM to AAROM to AROM ensuring proper kinematics throughout care to avoid over stress to deltoid/deltoid attachments   RED FLAGS: None  HAND DOMINANCE: Right  WEIGHT BEARING RESTRICTIONS: Yes Phase I (1-4 wks): <1 lb; Phase II-IV (4-12 wks): <5 lbs; Phase V (12-16 wks): advance to tolerance; Phase VI (16 wks to 6 mo): Full  FALLS:  Has patient fallen in last 6 months? Yes. Number of falls 1 - fell in garden in May  LIVING ENVIRONMENT:  Lives with: lives with their family Lives in: House/apartment Stairs: No Has following equipment at home: Environmental consultant - 2 wheeled, Crutches, and Grab bars  OCCUPATION: Retired  PLOF: Independent with household mobility without device, Independent with community mobility with device, Needs assistance with ADLs, and Leisure: gardening, 2-3 days/wk at [employer]  PATIENT GOALS: To use normal ROM (for my arm) as much as I can. To not be in pain all the time.   OBJECTIVE: (objective measures completed at initial evaluation unless otherwise dated)  DIAGNOSTIC FINDINGS:  08/07/23 - DG Left Shoulder: CLINICAL DATA:  Status post reverse shoulder arthroplasty.   FINDINGS: Total left shoulder arthroplasty. The arthroplasty components appear intact and in anatomic alignment. There is no acute fracture ordislocation. Postsurgical changes in the soft tissues of the leftshoulder.   IMPRESSION: Total left shoulder arthroplasty.  PATIENT SURVEYS:  Quick Dash: 68.2 / 100 = 68.2 % Minimally Clinically Important Difference (MCID): 15-20 points  COGNITION: Overall cognitive status: Within functional limits for tasks assessed     SENSATION: WFL  POSTURE: rounded shoulders and forward head  UPPER EXTREMITY ROM:   Passive ROM Left eval L 08/30/23 L 09/06/23  Shoulder flexion 58 90 110  Shoulder extension     Shoulder abduction 50 scaption 90  scaption 105  Shoulder adduction     Shoulder internal rotation     Shoulder external rotation -6 from neutral  24 at ~30 scaption 21   Active ROM Right  Left   Shoulder flexion    Shoulder extension    Shoulder abduction    Shoulder adduction    Shoulder internal rotation    Shoulder external rotation    Elbow flexion    Elbow extension    Wrist flexion    Wrist extension    Wrist ulnar deviation    Wrist radial deviation    Wrist pronation    Wrist  supination    (Blank rows = not tested)  UPPER EXTREMITY MMT: (deferred on eval d/t only 1 week postop)  MMT Right  Left   Shoulder flexion    Shoulder extension    Shoulder abduction    Shoulder adduction    Shoulder internal rotation    Shoulder external rotation    Middle trapezius    Lower trapezius    Elbow flexion    Elbow extension    Wrist flexion    Wrist extension    Wrist ulnar deviation    Wrist radial deviation    Wrist pronation    Wrist supination    Grip strength (lbs)    (Blank rows = not tested)  JOINT MOBILITY TESTING:  Patient very guarded with initial PROM  PALPATION/OBSERVATION: Bruising draining into upper arm.  Skin at cubital fossa pale and slightly moist/macerated.    TODAY'S TREATMENT:  09/06/23 THERAPEUTIC EXERCISE: To improve ROM and flexibility.  Demonstration, verbal and tactile cues throughout for technique. Seated L shoulder flexion slide with red pball x 20  Seated L shoulder scaption slide with red pball x 20  Shoulder isometrics self resisted: flex, abd, ER, ext 5x5 each, also demo'd wall isometrics, pt preferred self resisted version for HEP Seated scap retraction 15x5- cues to depress shoulders  PROM to L shoulder Flex, scap, ER within protocol limits  08/30/2023  THERAPEUTIC EXERCISE: To improve ROM and flexibility.  Demonstration, verbal and tactile cues throughout for technique. Seated L shoulder flexion towel slide on table x 10 - cues to use body lean to create  motion at shoulder Seated L shoulder scaption towel slide on table x 10 - cues to use body lean to create motion at shoulder Seated L shoulder flexion P/AAROM flexion using hand on Swiffer post and fwd body lean x 10 Seated L shoulder flexion P/AAROM scaption using hand on Swiffer post and fwd body lean x 10 Supine L shoulder PROM flex, abd in scapular plane, ER within limits of protocol x multiple reps each Supine with pillow under L humerus L elbow extension stretch & L elbow flex/ext AROM  MANUAL THERAPY: To promote normalized muscle tension, pain modulation, and reduced edema utilizing connective tissue massage and therapeutic massage. Supine with pillow under L humerus: retrograde massage L upper arm to reduce edema STM/DTM and manual TPR to reduce muscle tension in deltoids  NEUROMUSCULAR RE-EDUCATION: To improve kinesthesia and posture. Seated scapular retraction 10 x 5 - cues to avoid shoulder shrug and limit shoulder extensiont o neutral   SELF CARE: Provided education on PT POC progression, on post-surgical precautions, and ongoing limits of ROM per rehab protocol.  L shoulder PROM demonstrated for wife to assist at home.   08/23/23:  Manual and therapeutic exercise combined:  Supine with pillow under L humerus, retrograde massage L upper arm to reduce edema AAROM/PROM L shoulder flex, abd in scapular plane, ER multiple reps each Supine for L elbow extension /flexion, AAROM, L elbow ext with slight overpressure by PT  Side lying R for scapular clocks, therapist providing tactile cues/resistance medial border of scapula 5 rounds  Sitting and pt demonstrated table slides, needed correction for technique for the scaption tends to want to move into abduction and ext Seated L shoulder ER with cane  Instructed for home to engage in seated scapular retraction Instructed in semi reclined L elbow ext /flex to improve stiffness L elbow.    08/15/2023  SELF CARE:  Reviewed eval  findings and role  of PT in addressing identified deficits as well as instruction in initial HEP (see below).  Encouraged pt to spend a little more time out of sling while seated to allow for more air circulation at cubital fossa to prevent skin breakdown.   THERAPEUTIC EXERCISE: To improve ROM and flexibility.  Demonstration, verbal and tactile cues throughout for technique.  Seated L shoulder flexion towel slide on table x 10 - cues to use body lean to create motion at shoulder Seated L shoulder scaption towel slide on table x 10 - cues to use body lean to create motion at shoulder Seated L shoulder flexion P/AAROM flexion using hand on Swiffer post and fwd body lean x 10 Seated L shoulder flexion P/AAROM scaption using hand on Swiffer post and fwd body lean x 10 Seated L shoulder ER P/AAROM with cane and hand in neutral working on shoulder ER to neutral x 10   PATIENT EDUCATION:  Education details: HEP review, HEP update - scap retraction, and postural awareness  Person educated: Patient and Spouse Education method: Explanation, Demonstration, Verbal cues, and MedBridgeGO app updated Education comprehension: verbalized understanding, returned demonstration, verbal cues required, and needs further education  HOME EXERCISE PROGRAM: *Pt using MedBridgeGO app  Access Code: [email or web address] URL: [email or web address] Date: 09/06/2023 Prepared by: Sol Gaskins  Exercises - Seated Shoulder Flexion Towel Slide at Table Top  - 2-3 x daily - 7 x weekly - 2 sets - 10 reps - 3 sec  hold - Seated Shoulder Scaption Slide at Table Top with Forearm in Neutral  - 2-3 x daily - 7 x weekly - 2 sets - 10 reps - 6 sec hold - Seated Shoulder External Rotation AAROM with Cane and Hand in Neutral (Mirrored)  - 2-3 x daily - 7 x weekly - 2 sets - 10 reps - 3 sec hold - Seated Shoulder Flexion Extension AAROM with Dowel into Wall  - 2-3 x daily - 7 x weekly - 2 sets - 10 reps - 6 sec hold - Seated  Scapular Retraction  - 2 x daily - 7 x weekly - 2 sets - 10 reps - 3-5 sec hold - Seated Isometric Shoulder External Rotation  - 2 x daily - 7 x weekly - 2 sets - 5 reps - 5 sec hold - Seated Isometric Shoulder Flexion  - 2 x daily - 7 x weekly - 2 sets - 2 reps - 5 sec hold - Isometric Shoulder Abduction  - 2 x daily - 7 x weekly - 2 sets - 2 reps - 5 sec hold - Isometric Shoulder Extension with Ball at Wall  - 2 x daily - 7 x weekly - 2 sets - 2 reps - 5 sec hold   ASSESSMENT:  CLINICAL IMPRESSION: Pt now 4 weeks post op L reverse TSA. His PROM is improving very well into scaption and flexion, a little more limited into ER. Progressed to isometrics today, with cues to perform submaximally. Reminders during session required to avoid any extension or IR behind back. Taking it very easy with rehab as pt may tend to over do some exercises. [name] will benefit from continued skilled PT to address ongoing ROM and strength deficits to improve mobility and activity tolerance with decreased pain interference.   EVAL:Abdulkareem E Sholl is a 78 y.o. male who was referred to physical therapy for evaluation and treatment s/p L reverse TSA on 08/07/2023.  Patient reports nerve block wore off on POD #2 but pain has been mild (</=  3/10) since. Pain is worse with with perturbation to L UE.  Patient has deficits in L shoulder ROM and flexibility, B shoulder and postural strength, and abnormal posture which are interfering with ADLs and are impacting quality of life.  On QuickDASH patient scored 68.2/100 demonstrating 68.2% disability.  Potential for possible skin breakdown observed in L cubital fossa from moisture trapped while being in sling, therefore pt encouraged to spend a little more time out of the sling while seated to allow for air circulation to skin.  Dyrell will benefit from skilled PT to address above deficits to improve mobility and activity tolerance with decreased pain interference.  OBJECTIVE  IMPAIRMENTS: decreased activity tolerance, decreased knowledge of condition, decreased mobility, decreased ROM, decreased strength, increased fascial restrictions, impaired perceived functional ability, increased muscle spasms, impaired flexibility, improper body mechanics, postural dysfunction, and pain.   ACTIVITY LIMITATIONS: carrying, lifting, sleeping, bed mobility, bathing, toileting, dressing, self feeding, reach over head, and caring for others  PARTICIPATION LIMITATIONS: meal prep, cleaning, laundry, driving, and community activity  PERSONAL FACTORS: Age, Fitness, Past/current experiences, Time since onset of injury/illness/exacerbation, and 3+ comorbidities: Aneurysm of ascending aorta, aortic stenosis, B sensorineural hearing loss - extremely HOH, CAD s/p CABG, cardiomyopathy, AVR, chronic anticoagulation, DM-II with neuropathy, CVA of MCA, HTN, expressive aphasia, gout, lumbar stenosis s/p lumbar laminectomy and decompression 2023, shoulder OA, CKD, ventral hernia, hernia repair, hip surgery, hx multiple strokes (3-4), prostate cancer  are also affecting patient's functional outcome.   REHAB POTENTIAL: Good  CLINICAL DECISION MAKING: Evolving/moderate complexity  EVALUATION COMPLEXITY: Moderate   GOALS: Goals reviewed with patient? Yes  SHORT TERM GOALS: Target date: 09/26/2023  Patient will be independent with initial HEP to improve outcomes and carryover.  Baseline: Initial HEP provided on eval Goal status: IN PROGRESS - 08/30/23 - reviewed HEP and reinforced only P/AAROM at this phase of rehab protocol  2.  Patient will report 25% improvement in L shoulder pain to improve QOL.   Baseline: up to 3/10 Goal status: INITIAL  3.  Patient will improve L shoulder flexion and scaption to >/=90 to improve functional use of L UE. Baseline: Refer to above UE ROM table Goal status: MET- 09/06/23 see ROM table  LONG TERM GOALS: Target date: 11/07/2023  Patient will be independent with  ongoing/advanced HEP for self-management at home.  Baseline:  Goal status: INITIAL  2.  Patient will report 50-75% improvement in L shoulder pain to improve QOL.  Baseline: up to 3/10 Goal status: INITIAL  3.  Patient to demonstrate improved upright posture with posterior shoulder girdle engaged to promote improved glenohumeral joint mobility. Baseline:  Goal status: INITIAL  4.  Patient to improve L shoulder AROM to [facility] without pain provocation to allow for increased ease of ADLs.  Baseline: Refer to above UE ROM table Goal status: INITIAL  5.  Patient will demonstrate improved L shoulder strength to >/= 4/5 for functional UE use. Baseline: Refer to above UE MMT table Goal status: INITIAL  6  Patient will report </= 50% on QuickDASH  to demonstrate improved functional ability.  Baseline: 68.2 / 100 = 68.2 % Goal status: INITIAL    PLAN:  PT FREQUENCY: 1-2x/week - 1x/wk for rehab phase I (1-4 wks), 1-2x/wk thereafter  PT DURATION: 12 weeks  PLANNED INTERVENTIONS: [id number]- PT Re-evaluation, 97750- Physical Performance Testing, 97110-Therapeutic exercises, 97530- Therapeutic activity, [id number]- Neuromuscular re-education, 97535- Self Care, [id number]- Manual therapy, [id number]- Electrical stimulation (unattended), 97016- Vasopneumatic device, [id number]- Ultrasound, 02966- Ionotophoresis 4mg /ml Dexamethasone ,  Patient/Family education, Taping, Joint mobilization, Cryotherapy, and Moist heat  PLAN FOR NEXT SESSION: Gradually progress into L reverse TSA protocol phase II (4-6 wks) - post-op week #4 as of 09/03/20 (surgery 08/07/23)  PRECAUTIONS: Shoulder -  Avoid resisted IR/backward extension until 3 months post-op to protect subscapularis repair Progress patient in scapular plane from PROM to AAROM to AROM ensuring proper kinematics throughout care to avoid over stress to deltoid/deltoid attachments    Sol LITTIE Gaskins, PTA 09/06/2023, 10:28 AM

## 2023-09-14 ENCOUNTER — Encounter: Payer: Self-pay | Admitting: Physical Therapy

## 2023-09-14 ENCOUNTER — Ambulatory Visit: Admitting: Physical Therapy

## 2023-09-14 DIAGNOSIS — M25512 Pain in left shoulder: Secondary | ICD-10-CM

## 2023-09-14 DIAGNOSIS — M6281 Muscle weakness (generalized): Secondary | ICD-10-CM | POA: Diagnosis not present

## 2023-09-14 DIAGNOSIS — R293 Abnormal posture: Secondary | ICD-10-CM | POA: Diagnosis not present

## 2023-09-14 DIAGNOSIS — M25612 Stiffness of left shoulder, not elsewhere classified: Secondary | ICD-10-CM | POA: Diagnosis not present

## 2023-09-14 NOTE — Therapy (Signed)
 OUTPATIENT PHYSICAL THERAPY TREATMENT  Progress Note  Reporting Period 08/15/2023 to 09/14/2023   See note below for Objective Data and Assessment of Progress/Goals.     Patient Name: [name] MRN: [id number] DOB:11/25/1945, 78 y.o., male Today's Date: 09/14/2023   END OF SESSION:  PT End of Session - 09/14/23 0930     Visit Number 5    Date for PT Re-Evaluation 11/07/23    Authorization Type [employer]    Progress Note Due on Visit 15   MD PN for 09/19/23 appt on visit #5 (09/14/23)   PT Start Time 0930    PT Stop Time 1017    PT Time Calculation (min) 47 min    Activity Tolerance Patient tolerated treatment well    Behavior During Therapy [facility] for tasks assessed/performed              Past Medical History:  Diagnosis Date   Actinic keratosis 02/14/2011   Allergy    Aneurysm of ascending aorta without rupture (HCC) 01/06/2022   Aortic stenosis    Arthritis    Bilateral sensorineural hearing loss 06/16/2009   CAD (coronary artery disease)    cabg   Complication of anesthesia    Coronary atherosclerosis of native coronary artery 06/15/2009   Diabetic polyneuropathy 11/11/2018   Dyslipidemia 11/05/2018   Embolic stroke involving left middle cerebral artery  05/30/2021   Erectile dysfunction 05/18/2009   Essential hypertension 06/16/2009   Expressive aphasia 06/25/2021   External hemorrhoids 03/21/2010   Gout 04/09/2015   Heart murmur    Hemochromatosis 02/14/2011   History of aortic valve replacement 10/13/2009   History of CABG 11/05/2018   2005- 1. Coronary artery bypass grafting x4 with LIMA-LAD, SVG-OM1, SVG-AM and dCFX.  2. Aortic valve replacement with [employer] AVR 3. Septal myomectomy.  4. Myoview low risk 2016   History of hepatitis 01/31/1972   Hypertriglyceridemia 06/13/2010   Hypertrophic obstructive cardiomyopathy 05/06/2008   SP septal myomectomy at surgery 2005 Echo Nov 2018- Hyperdynamic LVEF with severe basal septal hypertrophy. There  is chordal SAM with resting gradient of 16 mmHg that increases to 85 mmHg with Valsalva        Leg weakness, bilateral 01/18/2021   Long term (current) use of anticoagulants 05/22/2018   Lumbar stenosis with neurogenic claudication 02/04/2021   Malignant neoplasm of prostate 06/17/2009   Mechanical heart valve present    Manufacturer: Deitra Rosemary Blade #: [id number]  Model #: [id number]. Card states MRI compatible with 3 teslas or less.   Obesity, unspecified 04/29/2008   OSA (obstructive sleep apnea) 07/12/2012   moderate-severe; uses CPAP nightly   PONV (postoperative nausea and vomiting)    after CABG- slow to wake up   Presence of prosthetic heart valve    Primary osteoarthritis of left shoulder 10/31/2018   S/P lumbar laminectomy 02/04/2021   Stage 3 chronic kidney disease due to type 2 diabetes mellitus 11/11/2018   Tobacco use 06/16/2009   Type 2 diabetes mellitus with hyperglycemia    Ventral hernia 03/03/2014   Past Surgical History:  Procedure Laterality Date   AORTIC VALVE REPLACEMENT  11/14/2003   [facility]   APPENDECTOMY  1990   CHOLECYSTECTOMY  1990   CORONARY ANGIOGRAPHY  10/11/2021   CORONARY ARTERY BYPASS GRAFT  10/2003   HERNIA REPAIR  1999   right, inguinal   HERNIA REPAIR  2002   left, inguinal   HIP SURGERY  2006   right hip  IR CT HEAD LTD  02/03/2022   IR PERCUTANEOUS ART THROMBECTOMY/INFUSION INTRACRANIAL INC DIAG ANGIO  01/24/2022   LUMBAR LAMINECTOMY/DECOMPRESSION MICRODISCECTOMY Bilateral 02/04/2021   Procedure: Bilateral Lumbar Two-Three Laminectomy;  Surgeon: Colon Shove, MD;  Location: [facility] OR;  Service: Neurosurgery;  Laterality: Bilateral;  3C/RM 20   PILONIDAL CYST EXCISION  1964   prostate seed implant  03/2010   RADIOLOGY WITH ANESTHESIA N/A 01/24/2022   Procedure: IR WITH ANESTHESIA;  Surgeon: Radiologist, Medication, MD;  Location: [facility] OR;  Service: Radiology;  Laterality: N/A;   REVERSE SHOULDER ARTHROPLASTY Left 08/07/2023   Procedure:  ARTHROPLASTY, SHOULDER, TOTAL, REVERSE;  Surgeon: Josefina Chew, MD;  Location: [facility] ORS;  Service: Orthopedics;  Laterality: Left;   TEE WITHOUT CARDIOVERSION N/A 03/14/2022   Procedure: TRANSESOPHAGEAL ECHOCARDIOGRAM (TEE);  Surgeon: Loni Soyla LABOR, MD;  Location: [facility] ENDOSCOPY;  Service: Cardiology;  Laterality: N/A;   TONSILLECTOMY  childhood   Patient Active Problem List   Diagnosis Date Noted   S/P reverse total shoulder arthroplasty, left 08/07/2023   Pre-op evaluation 01/17/2023   Left shoulder pain 11/17/2022   Hypertrophic cardiomyopathy (HCC) 08/12/2022   Chronic anticoagulation 08/12/2022   Hypotension 04/27/2022   H/O: CVA (cerebrovascular accident) 03/14/2022   Urinary retention 01/26/2022   Acute ischemic stroke (HCC) 01/24/2022   Middle cerebral artery embolism, left 01/24/2022   Aneurysm of ascending aorta without rupture (HCC) 01/06/2022   Type 2 diabetes mellitus with stage 3a chronic kidney disease, with long-term current use of insulin  (HCC) 10/30/2021   Type 2 diabetes mellitus with diabetic polyneuropathy, with long-term current use of insulin  (HCC) 10/30/2021   NSTEMI (non-ST elevated myocardial infarction) (HCC) 10/09/2021   Paroxysmal atrial fibrillation (HCC) 10/09/2021   Presence of prosthetic heart valve    Type 2 diabetes mellitus with hyperglycemia    Expressive aphasia 06/25/2021   History of CVA (cerebrovascular accident) 05/2021   S/P lumbar laminectomy 02/04/2021   Lumbar stenosis with neurogenic claudication 02/04/2021   History of falling 01/30/2021   Diabetic polyneuropathy 11/11/2018   Stage 3 chronic kidney disease due to type 2 diabetes mellitus 11/11/2018   History of CABG 11/05/2018   Dyslipidemia 11/05/2018   Primary osteoarthritis, left shoulder 10/31/2018   Long term (current) use of anticoagulants 05/22/2018   Ventral hernia 03/03/2014   OSA (obstructive sleep apnea) 07/12/2012   Hemochromatosis 02/14/2011   Actinic keratosis  02/14/2011   Hypertriglyceridemia 06/13/2010   External hemorrhoids 03/21/2010   History of aortic valve replacement 10/13/2009   Malignant neoplasm of prostate (HCC) 06/17/2009   Bilateral sensorineural hearing loss 06/16/2009   Essential hypertension 06/16/2009   Coronary atherosclerosis of native coronary artery 06/15/2009   Erectile dysfunction 05/18/2009   Obesity, unspecified 04/29/2008    PCP: Daryl Setter, NP   REFERRING PROVIDER: Josefina Chew, MD   REFERRING DIAG: [phone] (ICD-10-CM) - Status post reverse total shoulder replacement, left  THERAPY DIAG:  Stiffness of left shoulder, not elsewhere classified  Acute pain of left shoulder  Muscle weakness (generalized)  RATIONALE FOR EVALUATION AND TREATMENT: Rehabilitation  ONSET DATE: 08/07/2023 - L reverse TSA  NEXT MD VISIT: 09/19/23   SUBJECTIVE:  SUBJECTIVE STATEMENT:  Pt reports doing fine, no pain really except when first lying down in bed until he gets his arm situated on the pillow.  PAIN: Are you having pain? Yes: NPRS scale: 0/10 currently, intermittent up to 3/10 Pain location: L shoulder  Pain description: ache  Aggravating factors: perturbation to arm  Relieving factors: ice, Tylenol  PRN   PERTINENT HISTORY:  Aneurysm of ascending aorta, aortic stenosis, B sensorineural hearing loss - extremely HOH, CAD s/p CABG, cardiomyopathy, AVR, chronic anticoagulation, DM-II with neuropathy, CVA of MCA, HTN, expressive aphasia, gout, lumbar stenosis s/p lumbar laminectomy and decompression 2023, shoulder OA, CKD, ventral hernia, hernia repair, hip surgery, hx multiple strokes (3-4), prostate cancer   PRECAUTIONS: Shoulder -  Avoid resisted IR/backward extension until 3 months post-op to protect subscapularis  repair Progress patient in scapular plane from PROM to AAROM to AROM ensuring proper kinematics throughout care to avoid over stress to deltoid/deltoid attachments   RED FLAGS: None  HAND DOMINANCE: Right  WEIGHT BEARING RESTRICTIONS: Yes Phase I (1-4 wks): <1 lb; Phase II-IV (4-12 wks): <5 lbs; Phase V (12-16 wks): advance to tolerance; Phase VI (16 wks to 6 mo): Full  FALLS:  Has patient fallen in last 6 months? Yes. Number of falls 1 - fell in garden in May  LIVING ENVIRONMENT:  Lives with: lives with their family Lives in: House/apartment Stairs: No Has following equipment at home: Environmental consultant - 2 wheeled, Crutches, and Grab bars  OCCUPATION: Retired  PLOF: Independent with household mobility without device, Independent with community mobility with device, Needs assistance with ADLs, and Leisure: gardening, 2-3 days/wk at [employer]  PATIENT GOALS: To use normal ROM (for my arm) as much as I can. To not be in pain all the time.   OBJECTIVE: (objective measures completed at initial evaluation unless otherwise dated)  DIAGNOSTIC FINDINGS:  08/07/23 - DG Left Shoulder: CLINICAL DATA:  Status post reverse shoulder arthroplasty.   FINDINGS: Total left shoulder arthroplasty. The arthroplasty components appear intact and in anatomic alignment. There is no acute fracture ordislocation. Postsurgical changes in the soft tissues of the leftshoulder.   IMPRESSION: Total left shoulder arthroplasty.  PATIENT SURVEYS:  Quick Dash: 68.2 / 100 = 68.2 % Minimally Clinically Important Difference (MCID): 15-20 points  COGNITION: Overall cognitive status: Within functional limits for tasks assessed     SENSATION: WFL  POSTURE: rounded shoulders and forward head  UPPER EXTREMITY ROM:   Passive ROM Left eval L 08/30/23 L 09/06/23 L 09/14/23  Shoulder flexion 58 90 110 119  Shoulder extension      Shoulder abduction 50 scaption 90 scaption 105 117 scaption  Shoulder adduction       Shoulder internal rotation      Shoulder external rotation -6 from neutral  24 at ~30 scaption 21 31 at ~30 scaption   Active ROM Right  Left   Shoulder flexion    Shoulder extension    Shoulder abduction    Shoulder adduction    Shoulder internal rotation    Shoulder external rotation    Elbow flexion    Elbow extension    Wrist flexion    Wrist extension    Wrist ulnar deviation    Wrist radial deviation    Wrist pronation    Wrist supination    (Blank rows = not tested)  UPPER EXTREMITY MMT: (deferred on eval d/t only 1 week postop)  MMT Right  Left   Shoulder flexion    Shoulder extension  Shoulder abduction    Shoulder adduction    Shoulder internal rotation    Shoulder external rotation    Middle trapezius    Lower trapezius    Elbow flexion    Elbow extension    Wrist flexion    Wrist extension    Wrist ulnar deviation    Wrist radial deviation    Wrist pronation    Wrist supination    Grip strength (lbs)    (Blank rows = not tested)  JOINT MOBILITY TESTING:  Patient very guarded with initial PROM  PALPATION/OBSERVATION: Bruising draining into upper arm.  Skin at cubital fossa pale and slightly moist/macerated.    TODAY'S TREATMENT:   09/14/2023  THERAPEUTIC EXERCISE: To improve strength, ROM, and flexibility.  Demonstration, verbal and tactile cues throughout for technique. PROM to L shoulder Flex, scap, ER within protocol limits Supine wand AAROM within protocol limits - cues to avoid forcing painful ROM: Scaption with flexed elbows x 10 Flexion with flexed elbows x 10 ER at 30 scaption/abduction with upper arm elevated on towel roll x 10 Seated self-resisted L shoulder submax isometrics - reminders to limit effort to ~50%: Flexion 5 x 5 ABD 5 x 5 ER 5 x 5 Seated scap retraction 15 x 5 - cues to avoid shoulder shrug (decreased scapular activation noted R > L)  MANUAL THERAPY: To promote normalized muscle tension, improved flexibility,  increased ROM, and pain modulation utilizing joint mobilization, connective tissue massage, therapeutic massage, and manual TP therapy.  Gentle oscillation L GH joint to promote muscle relaxation STM/DTM and manual TPR to L deltoids   09/06/23 THERAPEUTIC EXERCISE: To improve ROM and flexibility.  Demonstration, verbal and tactile cues throughout for technique. Seated L shoulder flexion slide with red pball x 20  Seated L shoulder scaption slide with red pball x 20  Shoulder isometrics self resisted: flex, abd, ER, ext 5x5 each, also demo'd wall isometrics, pt preferred self resisted version for HEP Seated scap retraction 15x5- cues to depress shoulders  PROM to L shoulder Flex, scap, ER within protocol limits   08/30/2023  THERAPEUTIC EXERCISE: To improve ROM and flexibility.  Demonstration, verbal and tactile cues throughout for technique. Seated L shoulder flexion towel slide on table x 10 - cues to use body lean to create motion at shoulder Seated L shoulder scaption towel slide on table x 10 - cues to use body lean to create motion at shoulder Seated L shoulder flexion P/AAROM flexion using hand on Swiffer post and fwd body lean x 10 Seated L shoulder flexion P/AAROM scaption using hand on Swiffer post and fwd body lean x 10 Supine L shoulder PROM flex, abd in scapular plane, ER within limits of protocol x multiple reps each Supine with pillow under L humerus L elbow extension stretch & L elbow flex/ext AROM  MANUAL THERAPY: To promote normalized muscle tension, pain modulation, and reduced edema utilizing connective tissue massage and therapeutic massage. Supine with pillow under L humerus: retrograde massage L upper arm to reduce edema STM/DTM and manual TPR to reduce muscle tension in deltoids  NEUROMUSCULAR RE-EDUCATION: To improve kinesthesia and posture. Seated scapular retraction 10 x 5 - cues to avoid shoulder shrug and limit shoulder extensiont o neutral   SELF CARE:  Provided education on PT POC progression, on post-surgical precautions, and ongoing limits of ROM per rehab protocol.  L shoulder PROM demonstrated for wife to assist at home.   08/23/23:  Manual and therapeutic exercise combined:  Supine with  pillow under L humerus, retrograde massage L upper arm to reduce edema AAROM/PROM L shoulder flex, abd in scapular plane, ER multiple reps each Supine for L elbow extension /flexion, AAROM, L elbow ext with slight overpressure by PT  Side lying R for scapular clocks, therapist providing tactile cues/resistance medial border of scapula 5 rounds  Sitting and pt demonstrated table slides, needed correction for technique for the scaption tends to want to move into abduction and ext Seated L shoulder ER with cane  Instructed for home to engage in seated scapular retraction Instructed in semi reclined L elbow ext /flex to improve stiffness L elbow.    08/15/2023  SELF CARE:  Reviewed eval findings and role of PT in addressing identified deficits as well as instruction in initial HEP (see below).  Encouraged pt to spend a little more time out of sling while seated to allow for more air circulation at cubital fossa to prevent skin breakdown.   THERAPEUTIC EXERCISE: To improve ROM and flexibility.  Demonstration, verbal and tactile cues throughout for technique.  Seated L shoulder flexion towel slide on table x 10 - cues to use body lean to create motion at shoulder Seated L shoulder scaption towel slide on table x 10 - cues to use body lean to create motion at shoulder Seated L shoulder flexion P/AAROM flexion using hand on Swiffer post and fwd body lean x 10 Seated L shoulder flexion P/AAROM scaption using hand on Swiffer post and fwd body lean x 10 Seated L shoulder ER P/AAROM with cane and hand in neutral working on shoulder ER to neutral x 10   PATIENT EDUCATION:  Education details: HEP review, HEP update - scap retraction, and postural awareness   Person educated: Patient and Spouse Education method: Explanation, Demonstration, Verbal cues, and MedBridgeGO app updated Education comprehension: verbalized understanding, returned demonstration, verbal cues required, and needs further education  HOME EXERCISE PROGRAM: *Pt using MedBridgeGO app  Access Code: [email or web address] URL: [email or web address] Date: 09/14/2023 Prepared by: Elijah Hidden  Exercises - Seated Shoulder Flexion Towel Slide at Table Top  - 2-3 x daily - 7 x weekly - 2 sets - 10 reps - 3 sec  hold - Seated Shoulder Scaption Slide at Table Top with Forearm in Neutral  - 2-3 x daily - 7 x weekly - 2 sets - 10 reps - 6 sec hold - Seated Shoulder External Rotation AAROM with Cane and Hand in Neutral (Mirrored)  - 2-3 x daily - 7 x weekly - 2 sets - 10 reps - 3 sec hold - Seated Shoulder Flexion Extension AAROM with Dowel into Wall  - 2-3 x daily - 7 x weekly - 2 sets - 10 reps - 6 sec hold - Seated Scapular Retraction  - 2 x daily - 7 x weekly - 2 sets - 10 reps - 3-5 sec hold - Seated Isometric Shoulder Flexion  - 2 x daily - 7 x weekly - 2 sets - 5 reps - 5 sec hold - Isometric Shoulder Abduction  - 2 x daily - 7 x weekly - 2 sets - 5 reps - 5 sec hold - Seated Isometric Shoulder External Rotation  - 2 x daily - 7 x weekly - 2 sets - 5 reps - 5 sec hold - Supine Shoulder Flexion AAROM with Dowel  - 1 x daily - 7 x weekly - 2 sets - 10 reps - 3 sec hold   ASSESSMENT:  CLINICAL IMPRESSION: Eidan is now 5 weeks post-op  from L reverse TSA. L shoulder PROM is on par with expectations from rehab protocol for this phase post-op, but frequent cues still necessary to avoid forcing painful ROM. Frequent cues also necessary to avoid independent AROM of L shoulder, focusing on supported AAROM, as well as avoiding shoulder IR/extension.  Reviewed technique for submax isometrics introduced last visit, reinforcing only max of ~50% effort during isometrics.  He will enter phase  III of the post-op rehab protocol starting next week where we will gradually initiate AROM and progress parascapular strengthening.  Jhoel will benefit from continued skilled PT to address ongoing ROM and strength deficits to improve mobility and activity tolerance with decreased pain interference.   EVAL: QUENTION MCNEILL is a 78 y.o. male who was referred to physical therapy for evaluation and treatment s/p L reverse TSA on 08/07/2023.  Patient reports nerve block wore off on POD #2 but pain has been mild (</= 3/10) since. Pain is worse with with perturbation to L UE.  Patient has deficits in L shoulder ROM and flexibility, B shoulder and postural strength, and abnormal posture which are interfering with ADLs and are impacting quality of life.  On QuickDASH patient scored 68.2/100 demonstrating 68.2% disability.  Potential for possible skin breakdown observed in L cubital fossa from moisture trapped while being in sling, therefore pt encouraged to spend a little more time out of the sling while seated to allow for air circulation to skin.  Elim will benefit from skilled PT to address above deficits to improve mobility and activity tolerance with decreased pain interference.  OBJECTIVE IMPAIRMENTS: decreased activity tolerance, decreased knowledge of condition, decreased mobility, decreased ROM, decreased strength, increased fascial restrictions, impaired perceived functional ability, increased muscle spasms, impaired flexibility, improper body mechanics, postural dysfunction, and pain.   ACTIVITY LIMITATIONS: carrying, lifting, sleeping, bed mobility, bathing, toileting, dressing, self feeding, reach over head, and caring for others  PARTICIPATION LIMITATIONS: meal prep, cleaning, laundry, driving, and community activity  PERSONAL FACTORS: Age, Fitness, Past/current experiences, Time since onset of injury/illness/exacerbation, and 3+ comorbidities: Aneurysm of ascending aorta, aortic stenosis, B  sensorineural hearing loss - extremely HOH, CAD s/p CABG, cardiomyopathy, AVR, chronic anticoagulation, DM-II with neuropathy, CVA of MCA, HTN, expressive aphasia, gout, lumbar stenosis s/p lumbar laminectomy and decompression 2023, shoulder OA, CKD, ventral hernia, hernia repair, hip surgery, hx multiple strokes (3-4), prostate cancer  are also affecting patient's functional outcome.   REHAB POTENTIAL: Good  CLINICAL DECISION MAKING: Evolving/moderate complexity  EVALUATION COMPLEXITY: Moderate   GOALS: Goals reviewed with patient? Yes  SHORT TERM GOALS: Target date: 09/26/2023  Patient will be independent with initial HEP to improve outcomes and carryover.  Baseline: Initial HEP provided on eval 08/30/23 - reviewed HEP and reinforced only P/AAROM at this phase of rehab protocol Goal status: IN PROGRESS - 09/14/23 - reviewed HEP and reinforced only P/AAROM at this phase of rehab protocol  2.  Patient will report 25% improvement in L shoulder pain to improve QOL.   Baseline: up to 3/10 Goal status: MET - 09/14/23 - 85% improved   3.  Patient will improve L shoulder flexion and scaption to >/=90 to improve functional use of L UE. Baseline: Refer to above UE ROM table Goal status: MET - 09/06/23 see ROM table  LONG TERM GOALS: Target date: 11/07/2023  Patient will be independent with ongoing/advanced HEP for self-management at home.  Baseline:  Goal status: INITIAL  2.  Patient will report 50-75% improvement in L shoulder pain to improve  QOL.  Baseline: up to 3/10 Goal status: MET - 09/14/23 - 85% improved   3.  Patient to demonstrate improved upright posture with posterior shoulder girdle engaged to promote improved glenohumeral joint mobility. Baseline:  Goal status: INITIAL  4.  Patient to improve L shoulder AROM to [facility] without pain provocation to allow for increased ease of ADLs.  Baseline: Refer to above UE ROM table Goal status: INITIAL  5.  Patient will demonstrate improved  L shoulder strength to >/= 4/5 for functional UE use. Baseline: Refer to above UE MMT table Goal status: INITIAL  6  Patient will report </= 50% on QuickDASH  to demonstrate improved functional ability.  Baseline: 68.2 / 100 = 68.2 % Goal status: INITIAL    PLAN:  PT FREQUENCY: 1-2x/week - 1x/wk for rehab phase I (1-4 wks), 1-2x/wk thereafter  PT DURATION: 12 weeks  PLANNED INTERVENTIONS: 02835- PT Re-evaluation, 97750- Physical Performance Testing, 97110-Therapeutic exercises, 97530- Therapeutic activity, [id number]- Neuromuscular re-education, 97535- Self Care, 02859- Manual therapy, [id number]- Electrical stimulation (unattended), [id number]- Vasopneumatic device, [id number]- Ultrasound, 02966- Ionotophoresis 4mg /ml Dexamethasone , Patient/Family education, Taping, Joint mobilization, Cryotherapy, and Moist heat  PLAN FOR NEXT SESSION: Gradually progress into L reverse TSA protocol phase III (6-10 wks) - post-op week #6 as of 09/17/20 (surgery 08/07/23)  PRECAUTIONS: Shoulder -  Avoid resisted IR/backward extension until 3 months post-op to protect subscapularis repair Progress patient in scapular plane from PROM to AAROM to AROM ensuring proper kinematics throughout care to avoid over stress to deltoid/deltoid attachments    Elijah CHRISTELLA Hidden, PT 09/14/2023, 10:45 AM

## 2023-09-17 ENCOUNTER — Ambulatory Visit: Admitting: Physical Therapy

## 2023-09-20 ENCOUNTER — Other Ambulatory Visit: Payer: Self-pay | Admitting: Internal Medicine

## 2023-09-20 ENCOUNTER — Ambulatory Visit: Admitting: Physical Therapy

## 2023-09-20 ENCOUNTER — Encounter: Payer: Self-pay | Admitting: Physical Therapy

## 2023-09-20 DIAGNOSIS — M25512 Pain in left shoulder: Secondary | ICD-10-CM

## 2023-09-20 DIAGNOSIS — M6281 Muscle weakness (generalized): Secondary | ICD-10-CM | POA: Diagnosis not present

## 2023-09-20 DIAGNOSIS — R293 Abnormal posture: Secondary | ICD-10-CM

## 2023-09-20 DIAGNOSIS — M25612 Stiffness of left shoulder, not elsewhere classified: Secondary | ICD-10-CM | POA: Diagnosis not present

## 2023-09-20 NOTE — Telephone Encounter (Signed)
 Refill request complete

## 2023-09-20 NOTE — Therapy (Addendum)
 OUTPATIENT PHYSICAL THERAPY TREATMENT    Patient Name: [name] MRN: [id number] DOB:15-Mar-1945, 78 y.o., male Today's Date: 09/20/2023   END OF SESSION:  PT End of Session - 09/20/23 0936     Visit Number 6    Date for PT Re-Evaluation 11/07/23    Authorization Type [employer]    Progress Note Due on Visit 15   MD PN for 09/19/23 appt on visit #5 (09/14/23)   PT Start Time 0936   Pt arrived late   PT Stop Time 1015    PT Time Calculation (min) 39 min    Activity Tolerance Patient tolerated treatment well    Behavior During Therapy [facility] for tasks assessed/performed               Past Medical History:  Diagnosis Date   Actinic keratosis 02/14/2011   Allergy    Aneurysm of ascending aorta without rupture (HCC) 01/06/2022   Aortic stenosis    Arthritis    Bilateral sensorineural hearing loss 06/16/2009   CAD (coronary artery disease)    cabg   Complication of anesthesia    Coronary atherosclerosis of native coronary artery 06/15/2009   Diabetic polyneuropathy 11/11/2018   Dyslipidemia 11/05/2018   Embolic stroke involving left middle cerebral artery  05/30/2021   Erectile dysfunction 05/18/2009   Essential hypertension 06/16/2009   Expressive aphasia 06/25/2021   External hemorrhoids 03/21/2010   Gout 04/09/2015   Heart murmur    Hemochromatosis 02/14/2011   History of aortic valve replacement 10/13/2009   History of CABG 11/05/2018   2005- 1. Coronary artery bypass grafting x4 with LIMA-LAD, SVG-OM1, SVG-AM and dCFX.  2. Aortic valve replacement with [employer] AVR 3. Septal myomectomy.  4. Myoview low risk 2016   History of hepatitis 01/31/1972   Hypertriglyceridemia 06/13/2010   Hypertrophic obstructive cardiomyopathy 05/06/2008   SP septal myomectomy at surgery 2005 Echo Nov 2018- Hyperdynamic LVEF with severe basal septal hypertrophy. There is chordal SAM with resting gradient of 16 mmHg that increases to 85 mmHg with Valsalva        Leg weakness,  bilateral 01/18/2021   Long term (current) use of anticoagulants 05/22/2018   Lumbar stenosis with neurogenic claudication 02/04/2021   Malignant neoplasm of prostate 06/17/2009   Mechanical heart valve present    Manufacturer: Deitra Rosemary Blade #: [id number]  Model #: [id number]. Card states MRI compatible with 3 teslas or less.   Obesity, unspecified 04/29/2008   OSA (obstructive sleep apnea) 07/12/2012   moderate-severe; uses CPAP nightly   PONV (postoperative nausea and vomiting)    after CABG- slow to wake up   Presence of prosthetic heart valve    Primary osteoarthritis of left shoulder 10/31/2018   S/P lumbar laminectomy 02/04/2021   Stage 3 chronic kidney disease due to type 2 diabetes mellitus 11/11/2018   Tobacco use 06/16/2009   Type 2 diabetes mellitus with hyperglycemia    Ventral hernia 03/03/2014   Past Surgical History:  Procedure Laterality Date   AORTIC VALVE REPLACEMENT  11/14/2003   [employer] [facility]   APPENDECTOMY  1990   CHOLECYSTECTOMY  1990   CORONARY ANGIOGRAPHY  10/11/2021   CORONARY ARTERY BYPASS GRAFT  10/2003   HERNIA REPAIR  1999   right, inguinal   HERNIA REPAIR  2002   left, inguinal   HIP SURGERY  2006   right hip   IR CT HEAD LTD  02/03/2022   IR PERCUTANEOUS ART THROMBECTOMY/INFUSION INTRACRANIAL INC DIAG ANGIO  01/24/2022   LUMBAR LAMINECTOMY/DECOMPRESSION MICRODISCECTOMY Bilateral 02/04/2021   Procedure: Bilateral Lumbar Two-Three Laminectomy;  Surgeon: Colon Shove, MD;  Location: [facility] OR;  Service: Neurosurgery;  Laterality: Bilateral;  3C/RM 20   PILONIDAL CYST EXCISION  1964   prostate seed implant  03/2010   RADIOLOGY WITH ANESTHESIA N/A 01/24/2022   Procedure: IR WITH ANESTHESIA;  Surgeon: Radiologist, Medication, MD;  Location: [facility] OR;  Service: Radiology;  Laterality: N/A;   REVERSE SHOULDER ARTHROPLASTY Left 08/07/2023   Procedure: ARTHROPLASTY, SHOULDER, TOTAL, REVERSE;  Surgeon: Josefina Chew, MD;  Location: [facility] ORS;  Service:  Orthopedics;  Laterality: Left;   TEE WITHOUT CARDIOVERSION N/A 03/14/2022   Procedure: TRANSESOPHAGEAL ECHOCARDIOGRAM (TEE);  Surgeon: Loni Soyla LABOR, MD;  Location: [facility] ENDOSCOPY;  Service: Cardiology;  Laterality: N/A;   TONSILLECTOMY  childhood   Patient Active Problem List   Diagnosis Date Noted   S/P reverse total shoulder arthroplasty, left 08/07/2023   Pre-op evaluation 01/17/2023   Left shoulder pain 11/17/2022   Hypertrophic cardiomyopathy (HCC) 08/12/2022   Chronic anticoagulation 08/12/2022   Hypotension 04/27/2022   H/O: CVA (cerebrovascular accident) 03/14/2022   Urinary retention 01/26/2022   Acute ischemic stroke (HCC) 01/24/2022   Middle cerebral artery embolism, left 01/24/2022   Aneurysm of ascending aorta without rupture (HCC) 01/06/2022   Type 2 diabetes mellitus with stage 3a chronic kidney disease, with long-term current use of insulin  (HCC) 10/30/2021   Type 2 diabetes mellitus with diabetic polyneuropathy, with long-term current use of insulin  (HCC) 10/30/2021   NSTEMI (non-ST elevated myocardial infarction) (HCC) 10/09/2021   Paroxysmal atrial fibrillation (HCC) 10/09/2021   Presence of prosthetic heart valve    Type 2 diabetes mellitus with hyperglycemia    Expressive aphasia 06/25/2021   History of CVA (cerebrovascular accident) 05/2021   S/P lumbar laminectomy 02/04/2021   Lumbar stenosis with neurogenic claudication 02/04/2021   History of falling 01/30/2021   Diabetic polyneuropathy 11/11/2018   Stage 3 chronic kidney disease due to type 2 diabetes mellitus 11/11/2018   History of CABG 11/05/2018   Dyslipidemia 11/05/2018   Primary osteoarthritis, left shoulder 10/31/2018   Long term (current) use of anticoagulants 05/22/2018   Ventral hernia 03/03/2014   OSA (obstructive sleep apnea) 07/12/2012   Hemochromatosis 02/14/2011   Actinic keratosis 02/14/2011   Hypertriglyceridemia 06/13/2010   External hemorrhoids 03/21/2010   History of aortic  valve replacement 10/13/2009   Malignant neoplasm of prostate (HCC) 06/17/2009   Bilateral sensorineural hearing loss 06/16/2009   Essential hypertension 06/16/2009   Coronary atherosclerosis of native coronary artery 06/15/2009   Erectile dysfunction 05/18/2009   Obesity, unspecified 04/29/2008    PCP: Daryl Setter, NP   REFERRING PROVIDER: Josefina Chew, MD   REFERRING DIAG: [phone] (ICD-10-CM) - Status post reverse total shoulder replacement, left  THERAPY DIAG:  Stiffness of left shoulder, not elsewhere classified  Acute pain of left shoulder  Muscle weakness (generalized)  Abnormal posture  RATIONALE FOR EVALUATION AND TREATMENT: Rehabilitation  ONSET DATE: 08/07/2023 - L reverse TSA  NEXT MD VISIT: 09/19/23   SUBJECTIVE:  SUBJECTIVE STATEMENT:  Pt reports MD was pleased with his progress.  He has been cleared to wean from the sling as tolerated.  PAIN: Are you having pain? Yes: NPRS scale: 0/10 currently, intermittent up to 3/10 Pain location: L shoulder  Pain description: ache  Aggravating factors: perturbation to arm  Relieving factors: ice, Tylenol  PRN   PERTINENT HISTORY:  Aneurysm of ascending aorta, aortic stenosis, B sensorineural hearing loss - extremely HOH, CAD s/p CABG, cardiomyopathy, AVR, chronic anticoagulation, DM-II with neuropathy, CVA of MCA, HTN, expressive aphasia, gout, lumbar stenosis s/p lumbar laminectomy and decompression 2023, shoulder OA, CKD, ventral hernia, hernia repair, hip surgery, hx multiple strokes (3-4), prostate cancer   PRECAUTIONS: Shoulder -  Avoid resisted IR/backward extension until 3 months post-op to protect subscapularis repair Progress patient in scapular plane from PROM to AAROM to AROM ensuring proper kinematics  throughout care to avoid over stress to deltoid/deltoid attachments   RED FLAGS: None  HAND DOMINANCE: Right  WEIGHT BEARING RESTRICTIONS: Yes Phase I (1-4 wks): <1 lb; Phase II-IV (4-12 wks): <5 lbs; Phase V (12-16 wks): advance to tolerance; Phase VI (16 wks to 6 mo): Full  FALLS:  Has patient fallen in last 6 months? Yes. Number of falls 1 - fell in garden in May  LIVING ENVIRONMENT:  Lives with: lives with their family Lives in: House/apartment Stairs: No Has following equipment at home: Environmental consultant - 2 wheeled, Crutches, and Grab bars  OCCUPATION: Retired  PLOF: Independent with household mobility without device, Independent with community mobility with device, Needs assistance with ADLs, and Leisure: gardening, 2-3 days/wk at [employer]  PATIENT GOALS: To use normal ROM (for my arm) as much as I can. To not be in pain all the time.   OBJECTIVE: (objective measures completed at initial evaluation unless otherwise dated)  DIAGNOSTIC FINDINGS:  08/07/23 - DG Left Shoulder: CLINICAL DATA:  Status post reverse shoulder arthroplasty.   FINDINGS: Total left shoulder arthroplasty. The arthroplasty components appear intact and in anatomic alignment. There is no acute fracture ordislocation. Postsurgical changes in the soft tissues of the leftshoulder.   IMPRESSION: Total left shoulder arthroplasty.  PATIENT SURVEYS:  Quick Dash: 68.2 / 100 = 68.2 % Minimally Clinically Important Difference (MCID): 15-20 points  COGNITION: Overall cognitive status: Within functional limits for tasks assessed     SENSATION: WFL  POSTURE: rounded shoulders and forward head  UPPER EXTREMITY ROM:   Passive ROM Left eval L 08/30/23 L 09/06/23 L 09/14/23  Shoulder flexion 58 90 110 119  Shoulder extension      Shoulder abduction 50 scaption 90 scaption 105 117 scaption  Shoulder adduction      Shoulder internal rotation      Shoulder external rotation -6 from neutral  24 at ~30 scaption 21  31 at ~30 scaption   Active ROM Right  Left   Shoulder flexion    Shoulder extension    Shoulder abduction    Shoulder adduction    Shoulder internal rotation    Shoulder external rotation    Elbow flexion    Elbow extension    Wrist flexion    Wrist extension    Wrist ulnar deviation    Wrist radial deviation    Wrist pronation    Wrist supination    (Blank rows = not tested)  UPPER EXTREMITY MMT: (deferred on eval d/t only 1 week postop)  MMT Right  Left   Shoulder flexion    Shoulder extension  Shoulder abduction    Shoulder adduction    Shoulder internal rotation    Shoulder external rotation    Middle trapezius    Lower trapezius    Elbow flexion    Elbow extension    Wrist flexion    Wrist extension    Wrist ulnar deviation    Wrist radial deviation    Wrist pronation    Wrist supination    Grip strength (lbs)    (Blank rows = not tested)  JOINT MOBILITY TESTING:  Patient very guarded with initial PROM  PALPATION/OBSERVATION: Bruising draining into upper arm.  Skin at cubital fossa pale and slightly moist/macerated.    TODAY'S TREATMENT:   09/20/2023  THERAPEUTIC EXERCISE: To improve strength, endurance, ROM, and flexibility.  Demonstration, verbal and tactile cues throughout for technique.  Supine L shoulder AROM flexion with elbow flexed (uppercut) 2 x 10 Supine L shoulder AROM scaption with elbow flexed (hitchhiker) 2 x 10 Supine L shoulder protraction at 90 flexion 2 x 10  NEUROMUSCULAR RE-EDUCATION: To improve coordination and kinesthesia. L shoulder rhythmic stabilization at 90 flexion x 30 sec with 2-finger perturbation at elbow, x 30 sec with 2-finger perturbation at distal  Prone scapular retraction with (cues to avoid shoulder shrug): Row to neutral 2 x 10 Shoulder extension to neutral 2 x 10   09/14/2023  THERAPEUTIC EXERCISE: To improve strength, ROM, and flexibility.  Demonstration, verbal and tactile cues throughout for  technique. PROM to L shoulder Flex, scap, ER within protocol limits Supine wand AAROM within protocol limits - cues to avoid forcing painful ROM: Scaption with flexed elbows x 10 Flexion with flexed elbows x 10 ER at 30 scaption/abduction with upper arm elevated on towel roll x 10 Seated self-resisted L shoulder submax isometrics - reminders to limit effort to ~50%: Flexion 5 x 5 ABD 5 x 5 ER 5 x 5 Seated scap retraction 15 x 5 - cues to avoid shoulder shrug (decreased scapular activation noted R > L)  MANUAL THERAPY: To promote normalized muscle tension, improved flexibility, increased ROM, and pain modulation utilizing joint mobilization, connective tissue massage, therapeutic massage, and manual TP therapy.  Gentle oscillation L GH joint to promote muscle relaxation STM/DTM and manual TPR to L deltoids   09/06/23 THERAPEUTIC EXERCISE: To improve ROM and flexibility.  Demonstration, verbal and tactile cues throughout for technique. Seated L shoulder flexion slide with red pball x 20  Seated L shoulder scaption slide with red pball x 20  Shoulder isometrics self resisted: flex, abd, ER, ext 5x5 each, also demo'd wall isometrics, pt preferred self resisted version for HEP Seated scap retraction 15x5- cues to depress shoulders  PROM to L shoulder Flex, scap, ER within protocol limits   08/30/2023  THERAPEUTIC EXERCISE: To improve ROM and flexibility.  Demonstration, verbal and tactile cues throughout for technique. Seated L shoulder flexion towel slide on table x 10 - cues to use body lean to create motion at shoulder Seated L shoulder scaption towel slide on table x 10 - cues to use body lean to create motion at shoulder Seated L shoulder flexion P/AAROM flexion using hand on Swiffer post and fwd body lean x 10 Seated L shoulder flexion P/AAROM scaption using hand on Swiffer post and fwd body lean x 10 Supine L shoulder PROM flex, abd in scapular plane, ER within limits of  protocol x multiple reps each Supine with pillow under L humerus L elbow extension stretch & L elbow flex/ext AROM  MANUAL THERAPY:  To promote normalized muscle tension, pain modulation, and reduced edema utilizing connective tissue massage and therapeutic massage. Supine with pillow under L humerus: retrograde massage L upper arm to reduce edema STM/DTM and manual TPR to reduce muscle tension in deltoids  NEUROMUSCULAR RE-EDUCATION: To improve kinesthesia and posture. Seated scapular retraction 10 x 5 - cues to avoid shoulder shrug and limit shoulder extensiont o neutral   SELF CARE: Provided education on PT POC progression, on post-surgical precautions, and ongoing limits of ROM per rehab protocol.  L shoulder PROM demonstrated for wife to assist at home.   08/23/23:  Manual and therapeutic exercise combined:  Supine with pillow under L humerus, retrograde massage L upper arm to reduce edema AAROM/PROM L shoulder flex, abd in scapular plane, ER multiple reps each Supine for L elbow extension /flexion, AAROM, L elbow ext with slight overpressure by PT  Side lying R for scapular clocks, therapist providing tactile cues/resistance medial border of scapula 5 rounds  Sitting and pt demonstrated table slides, needed correction for technique for the scaption tends to want to move into abduction and ext Seated L shoulder ER with cane  Instructed for home to engage in seated scapular retraction Instructed in semi reclined L elbow ext /flex to improve stiffness L elbow.    08/15/2023  SELF CARE:  Reviewed eval findings and role of PT in addressing identified deficits as well as instruction in initial HEP (see below).  Encouraged pt to spend a little more time out of sling while seated to allow for more air circulation at cubital fossa to prevent skin breakdown.   THERAPEUTIC EXERCISE: To improve ROM and flexibility.  Demonstration, verbal and tactile cues throughout for technique.  Seated L  shoulder flexion towel slide on table x 10 - cues to use body lean to create motion at shoulder Seated L shoulder scaption towel slide on table x 10 - cues to use body lean to create motion at shoulder Seated L shoulder flexion P/AAROM flexion using hand on Swiffer post and fwd body lean x 10 Seated L shoulder flexion P/AAROM scaption using hand on Swiffer post and fwd body lean x 10 Seated L shoulder ER P/AAROM with cane and hand in neutral working on shoulder ER to neutral x 10   PATIENT EDUCATION:  Education details: HEP update - prone scap retraction, postural awareness, and reminders for surgical protocol precautions  Person educated: Patient Education method: Explanation, Demonstration, Verbal cues, and MedBridgeGO app updated Education comprehension: verbalized understanding, returned demonstration, verbal cues required, and needs further education  HOME EXERCISE PROGRAM: *Pt using MedBridgeGO app  Access Code: [email or web address] URL: [email or web address] Date: 09/20/2023 Prepared by: Elijah Hidden  Exercises - Seated Shoulder Flexion Towel Slide at Table Top  - 2-3 x daily - 7 x weekly - 2 sets - 10 reps - 3 sec  hold - Seated Shoulder Scaption Slide at Table Top with Forearm in Neutral  - 2-3 x daily - 7 x weekly - 2 sets - 10 reps - 6 sec hold - Seated Shoulder External Rotation AAROM with Cane and Hand in Neutral (Mirrored)  - 2-3 x daily - 7 x weekly - 2 sets - 10 reps - 3 sec hold - Seated Shoulder Flexion Extension AAROM with Dowel into Wall  - 2-3 x daily - 7 x weekly - 2 sets - 10 reps - 6 sec hold - Seated Scapular Retraction  - 2 x daily - 7 x weekly - 2 sets - 10 reps -  3-5 sec hold - Seated Isometric Shoulder Flexion  - 2 x daily - 7 x weekly - 2 sets - 5 reps - 5 sec hold - Isometric Shoulder Abduction  - 2 x daily - 7 x weekly - 2 sets - 5 reps - 5 sec hold - Seated Isometric Shoulder External Rotation  - 2 x daily - 7 x weekly - 2 sets - 5 reps - 5 sec hold -  Supine Shoulder Flexion AAROM with Dowel  - 1 x daily - 7 x weekly - 2 sets - 10 reps - 3 sec hold - Prone Scapular Retraction and Row  - 1 x daily - 7 x weekly - 2 sets - 10 reps - 3 sec hold - Prone Shoulder Extension - Single Arm  - 1 x daily - 7 x weekly - 2 sets - 10 reps - 3 sec hold   ASSESSMENT:  CLINICAL IMPRESSION: Vern reports trying to use his L arm to wipe after having a bowel movement, therefore reminded him again that his current precautions still prohibit IR/backwards extension for 3 months post-op.  Initiated progression from [facility] to short-arm flexion and scaption AROM in supine, again having to emphasize avoiding forcing ROM into painful motion and only performing motion in supine for now.  Also initiated rhythmic stabilization in supine with good control with proximal perturbations but more difficulty with distal perturbations.  Progressed scapular activation with prone rows and extension to neutral, emphasizing avoiding shoulder shrug and only bringing arm to level with side of body.  Armstrong will benefit from continued skilled PT to address ongoing ROM and strength deficits to improve mobility and activity tolerance with decreased pain interference.   EVAL: CASSELL VOORHIES is a 78 y.o. male who was referred to physical therapy for evaluation and treatment s/p L reverse TSA on 08/07/2023.  Patient reports nerve block wore off on POD #2 but pain has been mild (</= 3/10) since. Pain is worse with with perturbation to L UE.  Patient has deficits in L shoulder ROM and flexibility, B shoulder and postural strength, and abnormal posture which are interfering with ADLs and are impacting quality of life.  On QuickDASH patient scored 68.2/100 demonstrating 68.2% disability.  Potential for possible skin breakdown observed in L cubital fossa from moisture trapped while being in sling, therefore pt encouraged to spend a little more time out of the sling while seated to allow for air circulation  to skin.  Cletis will benefit from skilled PT to address above deficits to improve mobility and activity tolerance with decreased pain interference.  OBJECTIVE IMPAIRMENTS: decreased activity tolerance, decreased knowledge of condition, decreased mobility, decreased ROM, decreased strength, increased fascial restrictions, impaired perceived functional ability, increased muscle spasms, impaired flexibility, improper body mechanics, postural dysfunction, and pain.   ACTIVITY LIMITATIONS: carrying, lifting, sleeping, bed mobility, bathing, toileting, dressing, self feeding, reach over head, and caring for others  PARTICIPATION LIMITATIONS: meal prep, cleaning, laundry, driving, and community activity  PERSONAL FACTORS: Age, Fitness, Past/current experiences, Time since onset of injury/illness/exacerbation, and 3+ comorbidities: Aneurysm of ascending aorta, aortic stenosis, B sensorineural hearing loss - extremely HOH, CAD s/p CABG, cardiomyopathy, AVR, chronic anticoagulation, DM-II with neuropathy, CVA of MCA, HTN, expressive aphasia, gout, lumbar stenosis s/p lumbar laminectomy and decompression 2023, shoulder OA, CKD, ventral hernia, hernia repair, hip surgery, hx multiple strokes (3-4), prostate cancer  are also affecting patient's functional outcome.   REHAB POTENTIAL: Good  CLINICAL DECISION MAKING: Evolving/moderate complexity  EVALUATION COMPLEXITY:  Moderate   GOALS: Goals reviewed with patient? Yes  SHORT TERM GOALS: Target date: 09/26/2023  Patient will be independent with initial HEP to improve outcomes and carryover.  Baseline: Initial HEP provided on eval 08/30/23 - reviewed HEP and reinforced only P/AAROM at this phase of rehab protocol 09/14/23 - reviewed HEP and reinforced only P/AAROM at this phase of rehab protocol Goal status: MET - 09/20/23  2.  Patient will report 25% improvement in L shoulder pain to improve QOL.   Baseline: up to 3/10 Goal status: MET - 09/14/23 - 85%  improved   3.  Patient will improve L shoulder flexion and scaption to >/=90 to improve functional use of L UE. Baseline: Refer to above UE ROM table Goal status: MET - 09/06/23 see ROM table  LONG TERM GOALS: Target date: 11/07/2023  Patient will be independent with ongoing/advanced HEP for self-management at home.  Baseline:  Goal status: INITIAL  2.  Patient will report 50-75% improvement in L shoulder pain to improve QOL.  Baseline: up to 3/10 Goal status: MET - 09/14/23 - 85% improved   3.  Patient to demonstrate improved upright posture with posterior shoulder girdle engaged to promote improved glenohumeral joint mobility. Baseline:  Goal status: INITIAL  4.  Patient to improve L shoulder AROM to [facility] without pain provocation to allow for increased ease of ADLs.  Baseline: Refer to above UE ROM table Goal status: INITIAL  5.  Patient will demonstrate improved L shoulder strength to >/= 4/5 for functional UE use. Baseline: Refer to above UE MMT table Goal status: INITIAL  6  Patient will report </= 50% on QuickDASH  to demonstrate improved functional ability.  Baseline: 68.2 / 100 = 68.2 % Goal status: INITIAL    PLAN:  PT FREQUENCY: 1-2x/week - 1x/wk for rehab phase I (1-4 wks), 1-2x/wk thereafter  PT DURATION: 12 weeks  PLANNED INTERVENTIONS: 02835- PT Re-evaluation, 97750- Physical Performance Testing, 97110-Therapeutic exercises, 97530- Therapeutic activity, [id number]- Neuromuscular re-education, 97535- Self Care, 02859- Manual therapy, [id number]- Electrical stimulation (unattended), 97016- Vasopneumatic device, 97035- Ultrasound, 02966- Ionotophoresis 4mg /ml Dexamethasone , Patient/Family education, Taping, Joint mobilization, Cryotherapy, and Moist heat  PLAN FOR NEXT SESSION: Gradually progress into L reverse TSA protocol phase III (6-10 wks) - post-op week #6 as of 09/17/20 (surgery 08/07/23)  PRECAUTIONS: Shoulder -  Avoid resisted IR/backward extension until 3 months  post-op to protect subscapularis repair Progress patient in scapular plane from PROM to AAROM to AROM ensuring proper kinematics throughout care to avoid over stress to deltoid/deltoid attachments    Elijah CHRISTELLA Hidden, PT 09/20/2023, 10:38 AM

## 2023-09-25 ENCOUNTER — Encounter: Payer: Self-pay | Admitting: Internal Medicine

## 2023-09-25 ENCOUNTER — Telehealth: Payer: Self-pay

## 2023-09-25 ENCOUNTER — Ambulatory Visit: Admitting: Physical Therapy

## 2023-09-25 ENCOUNTER — Encounter: Payer: Self-pay | Admitting: Physical Therapy

## 2023-09-25 DIAGNOSIS — M25512 Pain in left shoulder: Secondary | ICD-10-CM

## 2023-09-25 DIAGNOSIS — M25612 Stiffness of left shoulder, not elsewhere classified: Secondary | ICD-10-CM

## 2023-09-25 DIAGNOSIS — R293 Abnormal posture: Secondary | ICD-10-CM | POA: Diagnosis not present

## 2023-09-25 DIAGNOSIS — M6281 Muscle weakness (generalized): Secondary | ICD-10-CM | POA: Diagnosis not present

## 2023-09-25 MED ORDER — DEXCOM G7 RECEIVER DEVI: 1.0000 | Status: AC

## 2023-09-25 NOTE — Therapy (Signed)
 OUTPATIENT PHYSICAL THERAPY TREATMENT    Patient Name: [name] MRN: [id number] DOB:[birth year], 78 y.o., male Today's Date: 09/25/2023   END OF SESSION:  PT End of Session - 09/25/23 0932     Visit Number 7    Date for PT Re-Evaluation 11/07/23    Authorization Type [employer]    Progress Note Due on Visit 15   MD PN for 09/19/23 appt on visit #5 (09/14/23)   PT Start Time 0932    PT Stop Time 1016    PT Time Calculation (min) 44 min    Activity Tolerance Patient tolerated treatment well    Behavior During Therapy [facility] for tasks assessed/performed                Past Medical History:  Diagnosis Date   Actinic keratosis 02/14/2011   Allergy    Aneurysm of ascending aorta without rupture (HCC) 01/06/2022   Aortic stenosis    Arthritis    Bilateral sensorineural hearing loss 06/16/2009   CAD (coronary artery disease)    cabg   Complication of anesthesia    Coronary atherosclerosis of native coronary artery 06/15/2009   Diabetic polyneuropathy 11/11/2018   Dyslipidemia 11/05/2018   Embolic stroke involving left middle cerebral artery  05/30/2021   Erectile dysfunction 05/18/2009   Essential hypertension 06/16/2009   Expressive aphasia 06/25/2021   External hemorrhoids 03/21/2010   Gout 04/09/2015   Heart murmur    Hemochromatosis 02/14/2011   History of aortic valve replacement 10/13/2009   History of CABG 11/05/2018   2005- 1. Coronary artery bypass grafting x4 with LIMA-LAD, SVG-OM1, SVG-AM and dCFX.  2. Aortic valve replacement with [employer] AVR 3. Septal myomectomy.  4. Myoview low risk 2016   History of hepatitis 01/31/1972   Hypertriglyceridemia 06/13/2010   Hypertrophic obstructive cardiomyopathy 05/06/2008   SP septal myomectomy at surgery 2005 Echo Nov 2018- Hyperdynamic LVEF with severe basal septal hypertrophy. There is chordal SAM with resting gradient of 16 mmHg that increases to 85 mmHg with Valsalva        Leg weakness, bilateral  01/18/2021   Long term (current) use of anticoagulants 05/22/2018   Lumbar stenosis with neurogenic claudication 02/04/2021   Malignant neoplasm of prostate 06/17/2009   Mechanical heart valve present    Manufacturer: Deitra Rosemary Blade #: [id number]  Model #: [id number]. Card states MRI compatible with 3 teslas or less.   Obesity, unspecified 04/29/2008   OSA (obstructive sleep apnea) 07/12/2012   moderate-severe; uses CPAP nightly   PONV (postoperative nausea and vomiting)    after CABG- slow to wake up   Presence of prosthetic heart valve    Primary osteoarthritis of left shoulder 10/31/2018   S/P lumbar laminectomy 02/04/2021   Stage 3 chronic kidney disease due to type 2 diabetes mellitus 11/11/2018   Tobacco use 06/16/2009   Type 2 diabetes mellitus with hyperglycemia    Ventral hernia 03/03/2014   Past Surgical History:  Procedure Laterality Date   AORTIC VALVE REPLACEMENT  11/14/2003   [facility]   APPENDECTOMY  1990   CHOLECYSTECTOMY  1990   CORONARY ANGIOGRAPHY  10/11/2021   CORONARY ARTERY BYPASS GRAFT  10/2003   HERNIA REPAIR  1999   right, inguinal   HERNIA REPAIR  2002   left, inguinal   HIP SURGERY  2006   right hip   IR CT HEAD LTD  02/03/2022   IR PERCUTANEOUS ART THROMBECTOMY/INFUSION INTRACRANIAL INC DIAG ANGIO  01/24/2022  LUMBAR LAMINECTOMY/DECOMPRESSION MICRODISCECTOMY Bilateral 02/04/2021   Procedure: Bilateral Lumbar Two-Three Laminectomy;  Surgeon: Colon Shove, MD;  Location: [facility] OR;  Service: Neurosurgery;  Laterality: Bilateral;  3C/RM 20   PILONIDAL CYST EXCISION  1964   prostate seed implant  03/2010   RADIOLOGY WITH ANESTHESIA N/A 01/24/2022   Procedure: IR WITH ANESTHESIA;  Surgeon: Radiologist, Medication, MD;  Location: [facility] OR;  Service: Radiology;  Laterality: N/A;   REVERSE SHOULDER ARTHROPLASTY Left 08/07/2023   Procedure: ARTHROPLASTY, SHOULDER, TOTAL, REVERSE;  Surgeon: Josefina Chew, MD;  Location: [facility] ORS;  Service: Orthopedics;   Laterality: Left;   TEE WITHOUT CARDIOVERSION N/A 03/14/2022   Procedure: TRANSESOPHAGEAL ECHOCARDIOGRAM (TEE);  Surgeon: Loni Soyla LABOR, MD;  Location: [facility] ENDOSCOPY;  Service: Cardiology;  Laterality: N/A;   TONSILLECTOMY  childhood   Patient Active Problem List   Diagnosis Date Noted   S/P reverse total shoulder arthroplasty, left 08/07/2023   Pre-op evaluation 01/17/2023   Left shoulder pain 11/17/2022   Hypertrophic cardiomyopathy (HCC) 08/12/2022   Chronic anticoagulation 08/12/2022   Hypotension 04/27/2022   H/O: CVA (cerebrovascular accident) 03/14/2022   Urinary retention 01/26/2022   Acute ischemic stroke (HCC) 01/24/2022   Middle cerebral artery embolism, left 01/24/2022   Aneurysm of ascending aorta without rupture (HCC) 01/06/2022   Type 2 diabetes mellitus with stage 3a chronic kidney disease, with long-term current use of insulin  (HCC) 10/30/2021   Type 2 diabetes mellitus with diabetic polyneuropathy, with long-term current use of insulin  (HCC) 10/30/2021   NSTEMI (non-ST elevated myocardial infarction) (HCC) 10/09/2021   Paroxysmal atrial fibrillation (HCC) 10/09/2021   Presence of prosthetic heart valve    Type 2 diabetes mellitus with hyperglycemia    Expressive aphasia 06/25/2021   History of CVA (cerebrovascular accident) 05/2021   S/P lumbar laminectomy 02/04/2021   Lumbar stenosis with neurogenic claudication 02/04/2021   History of falling 01/30/2021   Diabetic polyneuropathy 11/11/2018   Stage 3 chronic kidney disease due to type 2 diabetes mellitus 11/11/2018   History of CABG 11/05/2018   Dyslipidemia 11/05/2018   Primary osteoarthritis, left shoulder 10/31/2018   Long term (current) use of anticoagulants 05/22/2018   Ventral hernia 03/03/2014   OSA (obstructive sleep apnea) 07/12/2012   Hemochromatosis 02/14/2011   Actinic keratosis 02/14/2011   Hypertriglyceridemia 06/13/2010   External hemorrhoids 03/21/2010   History of aortic valve  replacement 10/13/2009   Malignant neoplasm of prostate (HCC) 06/17/2009   Bilateral sensorineural hearing loss 06/16/2009   Essential hypertension 06/16/2009   Coronary atherosclerosis of native coronary artery 06/15/2009   Erectile dysfunction 05/18/2009   Obesity, unspecified 04/29/2008    PCP: Daryl Setter, NP   REFERRING PROVIDER: Josefina Chew, MD   REFERRING DIAG: [phone] (ICD-10-CM) - Status post reverse total shoulder replacement, left  THERAPY DIAG:  Stiffness of left shoulder, not elsewhere classified  Acute pain of left shoulder  Muscle weakness (generalized)  Abnormal posture  RATIONALE FOR EVALUATION AND TREATMENT: Rehabilitation  ONSET DATE: 08/07/2023 - L reverse TSA  NEXT MD VISIT: 09/19/23   SUBJECTIVE:  SUBJECTIVE STATEMENT:  Pt reports no pain except when he does too much.  PAIN: Are you having pain? Yes: NPRS scale: 0/10 currently, intermittent up to 3/10 Pain location: L shoulder  Pain description: ache  Aggravating factors: perturbation to arm  Relieving factors: ice, Tylenol  PRN   PERTINENT HISTORY:  Aneurysm of ascending aorta, aortic stenosis, B sensorineural hearing loss - extremely HOH, CAD s/p CABG, cardiomyopathy, AVR, chronic anticoagulation, DM-II with neuropathy, CVA of MCA, HTN, expressive aphasia, gout, lumbar stenosis s/p lumbar laminectomy and decompression 2023, shoulder OA, CKD, ventral hernia, hernia repair, hip surgery, hx multiple strokes (3-4), prostate cancer   PRECAUTIONS: Shoulder -  Avoid resisted IR/backward extension until 3 months post-op to protect subscapularis repair Progress patient in scapular plane from PROM to AAROM to AROM ensuring proper kinematics throughout care to avoid over stress to deltoid/deltoid  attachments   RED FLAGS: None  HAND DOMINANCE: Right  WEIGHT BEARING RESTRICTIONS: Yes Phase I (1-4 wks): <1 lb; Phase II-IV (4-12 wks): <5 lbs; Phase V (12-16 wks): advance to tolerance; Phase VI (16 wks to 6 mo): Full  FALLS:  Has patient fallen in last 6 months? Yes. Number of falls 1 - fell in garden in May  LIVING ENVIRONMENT:  Lives with: lives with their family Lives in: House/apartment Stairs: No Has following equipment at home: Environmental consultant - 2 wheeled, Crutches, and Grab bars  OCCUPATION: Retired  PLOF: Independent with household mobility without device, Independent with community mobility with device, Needs assistance with ADLs, and Leisure: gardening, 2-3 days/wk at [employer]  PATIENT GOALS: To use normal ROM (for my arm) as much as I can. To not be in pain all the time.   OBJECTIVE: (objective measures completed at initial evaluation unless otherwise dated)  DIAGNOSTIC FINDINGS:  08/07/23 - DG Left Shoulder: CLINICAL DATA:  Status post reverse shoulder arthroplasty.   FINDINGS: Total left shoulder arthroplasty. The arthroplasty components appear intact and in anatomic alignment. There is no acute fracture ordislocation. Postsurgical changes in the soft tissues of the leftshoulder.   IMPRESSION: Total left shoulder arthroplasty.  PATIENT SURVEYS:  Quick Dash: 68.2 / 100 = 68.2 % Minimally Clinically Important Difference (MCID): 15-20 points  COGNITION: Overall cognitive status: Within functional limits for tasks assessed     SENSATION: WFL  POSTURE: rounded shoulders and forward head  UPPER EXTREMITY ROM:   Passive ROM Left eval L 08/30/23 L 09/06/23 L 09/14/23  Shoulder flexion 58 90 110 119  Shoulder extension      Shoulder abduction 50 scaption 90 scaption 105 117 scaption  Shoulder adduction      Shoulder internal rotation      Shoulder external rotation -6 from neutral  24 at ~30 scaption 21 31 at ~30 scaption   Active ROM Right  Left    Shoulder flexion    Shoulder extension    Shoulder abduction    Shoulder adduction    Shoulder internal rotation    Shoulder external rotation    Elbow flexion    Elbow extension    Wrist flexion    Wrist extension    Wrist ulnar deviation    Wrist radial deviation    Wrist pronation    Wrist supination    (Blank rows = not tested)  UPPER EXTREMITY MMT: (deferred on eval d/t only 1 week postop)  MMT Right  Left   Shoulder flexion    Shoulder extension    Shoulder abduction    Shoulder adduction  Shoulder internal rotation    Shoulder external rotation    Middle trapezius    Lower trapezius    Elbow flexion    Elbow extension    Wrist flexion    Wrist extension    Wrist ulnar deviation    Wrist radial deviation    Wrist pronation    Wrist supination    Grip strength (lbs)    (Blank rows = not tested)  JOINT MOBILITY TESTING:  Patient very guarded with initial PROM  PALPATION/OBSERVATION: Bruising draining into upper arm.  Skin at cubital fossa pale and slightly moist/macerated.    TODAY'S TREATMENT:   09/25/2023  THERAPEUTIC EXERCISE: To improve strength, endurance, ROM, and flexibility.  Demonstration, verbal and tactile cues throughout for technique. Pulleys - flexion and scaption x 3' each - cues to remain in pain free ROM Supine L shoulder AROM flexion with elbow flexed (uppercut) 2 x 10 Supine L shoulder AROM scaption with elbow flexed (hitchhiker) 2 x 10  NEUROMUSCULAR RE-EDUCATION: To improve coordination, kinesthesia, posture, and proprioception. Standing YTB scap retraction + B shoulder row 2 x 10 (VC & TC to avoid shoulder shrug) Standing YTB scap retraction + B shoulder extension to neutral 2 x 10 Prone scapular retraction with (cues to avoid shoulder shrug): Row to neutral 2 x 10 Shoulder extension to neutral 2 x 10 L shoulder rhythmic stabilization at 90 flexion x 30 sec with 2-finger perturbation at elbow, 2 x 30 sec with 2-finger  perturbation at distal forearm   09/20/2023  THERAPEUTIC EXERCISE: To improve strength, endurance, ROM, and flexibility.  Demonstration, verbal and tactile cues throughout for technique.  Supine L shoulder AROM flexion with elbow flexed (uppercut) 2 x 10 Supine L shoulder AROM scaption with elbow flexed (hitchhiker) 2 x 10 Supine L shoulder protraction at 90 flexion 2 x 10  NEUROMUSCULAR RE-EDUCATION: To improve coordination and kinesthesia. L shoulder rhythmic stabilization at 90 flexion x 30 sec with 2-finger perturbation at elbow, x 30 sec with 2-finger perturbation at distal  Prone scapular retraction with (cues to avoid shoulder shrug): Row to neutral 2 x 10 Shoulder extension to neutral 2 x 10   09/14/2023  THERAPEUTIC EXERCISE: To improve strength, ROM, and flexibility.  Demonstration, verbal and tactile cues throughout for technique. PROM to L shoulder Flex, scap, ER within protocol limits Supine wand AAROM within protocol limits - cues to avoid forcing painful ROM: Scaption with flexed elbows x 10 Flexion with flexed elbows x 10 ER at 30 scaption/abduction with upper arm elevated on towel roll x 10 Seated self-resisted L shoulder submax isometrics - reminders to limit effort to ~50%: Flexion 5 x 5 ABD 5 x 5 ER 5 x 5 Seated scap retraction 15 x 5 - cues to avoid shoulder shrug (decreased scapular activation noted R > L)  MANUAL THERAPY: To promote normalized muscle tension, improved flexibility, increased ROM, and pain modulation utilizing joint mobilization, connective tissue massage, therapeutic massage, and manual TP therapy.  Gentle oscillation L GH joint to promote muscle relaxation STM/DTM and manual TPR to L deltoids   09/06/23 THERAPEUTIC EXERCISE: To improve ROM and flexibility.  Demonstration, verbal and tactile cues throughout for technique. Seated L shoulder flexion slide with red pball x 20  Seated L shoulder scaption slide with red pball x 20  Shoulder  isometrics self resisted: flex, abd, ER, ext 5x5 each, also demo'd wall isometrics, pt preferred self resisted version for HEP Seated scap retraction 15x5- cues to depress shoulders  PROM to  L shoulder Flex, scap, ER within protocol limits   08/30/2023  THERAPEUTIC EXERCISE: To improve ROM and flexibility.  Demonstration, verbal and tactile cues throughout for technique. Seated L shoulder flexion towel slide on table x 10 - cues to use body lean to create motion at shoulder Seated L shoulder scaption towel slide on table x 10 - cues to use body lean to create motion at shoulder Seated L shoulder flexion P/AAROM flexion using hand on Swiffer post and fwd body lean x 10 Seated L shoulder flexion P/AAROM scaption using hand on Swiffer post and fwd body lean x 10 Supine L shoulder PROM flex, abd in scapular plane, ER within limits of protocol x multiple reps each Supine with pillow under L humerus L elbow extension stretch & L elbow flex/ext AROM  MANUAL THERAPY: To promote normalized muscle tension, pain modulation, and reduced edema utilizing connective tissue massage and therapeutic massage. Supine with pillow under L humerus: retrograde massage L upper arm to reduce edema STM/DTM and manual TPR to reduce muscle tension in deltoids  NEUROMUSCULAR RE-EDUCATION: To improve kinesthesia and posture. Seated scapular retraction 10 x 5 - cues to avoid shoulder shrug and limit shoulder extensiont o neutral   SELF CARE: Provided education on PT POC progression, on post-surgical precautions, and ongoing limits of ROM per rehab protocol.  L shoulder PROM demonstrated for wife to assist at home.   08/23/23:  Manual and therapeutic exercise combined:  Supine with pillow under L humerus, retrograde massage L upper arm to reduce edema AAROM/PROM L shoulder flex, abd in scapular plane, ER multiple reps each Supine for L elbow extension /flexion, AAROM, L elbow ext with slight overpressure by PT  Side  lying R for scapular clocks, therapist providing tactile cues/resistance medial border of scapula 5 rounds  Sitting and pt demonstrated table slides, needed correction for technique for the scaption tends to want to move into abduction and ext Seated L shoulder ER with cane  Instructed for home to engage in seated scapular retraction Instructed in semi reclined L elbow ext /flex to improve stiffness L elbow.    08/15/2023  SELF CARE:  Reviewed eval findings and role of PT in addressing identified deficits as well as instruction in initial HEP (see below).  Encouraged pt to spend a little more time out of sling while seated to allow for more air circulation at cubital fossa to prevent skin breakdown.   THERAPEUTIC EXERCISE: To improve ROM and flexibility.  Demonstration, verbal and tactile cues throughout for technique.  Seated L shoulder flexion towel slide on table x 10 - cues to use body lean to create motion at shoulder Seated L shoulder scaption towel slide on table x 10 - cues to use body lean to create motion at shoulder Seated L shoulder flexion P/AAROM flexion using hand on Swiffer post and fwd body lean x 10 Seated L shoulder flexion P/AAROM scaption using hand on Swiffer post and fwd body lean x 10 Seated L shoulder ER P/AAROM with cane and hand in neutral working on shoulder ER to neutral x 10   PATIENT EDUCATION:  Education details: HEP update - YTB scap retraction, postural awareness, and reminders for surgical protocol precautions  Person educated: Patient Education method: Explanation, Demonstration, Verbal cues, and MedBridgeGO app updated Education comprehension: verbalized understanding, returned demonstration, verbal cues required, and needs further education  HOME EXERCISE PROGRAM: *Pt using MedBridgeGO app  Access Code: [email or web address] URL: [email or web address] Date: 09/25/2023 Prepared by: Elijah Hidden  Exercises -  Seated Shoulder Flexion Towel Slide at  Table Top  - 2-3 x daily - 7 x weekly - 2 sets - 10 reps - 3 sec  hold - Seated Shoulder Scaption Slide at Table Top with Forearm in Neutral  - 2-3 x daily - 7 x weekly - 2 sets - 10 reps - 6 sec hold - Seated Shoulder External Rotation AAROM with Cane and Hand in Neutral (Mirrored)  - 2-3 x daily - 7 x weekly - 2 sets - 10 reps - 3 sec hold - Seated Shoulder Flexion Extension AAROM with Dowel into Wall  - 2-3 x daily - 7 x weekly - 2 sets - 10 reps - 6 sec hold - Seated Scapular Retraction  - 2 x daily - 7 x weekly - 2 sets - 10 reps - 3-5 sec hold - Seated Isometric Shoulder Flexion  - 2 x daily - 7 x weekly - 2 sets - 5 reps - 5 sec hold - Isometric Shoulder Abduction  - 2 x daily - 7 x weekly - 2 sets - 5 reps - 5 sec hold - Seated Isometric Shoulder External Rotation  - 2 x daily - 7 x weekly - 2 sets - 5 reps - 5 sec hold - Supine Shoulder Flexion AAROM with Dowel  - 1 x daily - 7 x weekly - 2 sets - 10 reps - 3 sec hold - Standing Shoulder Row with Anchored Resistance  - 1 x daily - 7 x weekly - 2 sets - 10 reps - 5 sec hold - Shoulder extension with resistance - Neutral  - 1 x daily - 7 x weekly - 2 sets - 10 reps - 5 sec hold   ASSESSMENT:  CLINICAL IMPRESSION: Normon reports difficulty with prone rows and extension at home as he feels his face get smothered in the bed, therefore transitioned HEP scapular strengthening to standing YTB shoulder rows and extension emphasizing scapular activation while avoiding extension beyond neutral and/or shoulder shrugs.  He continues to require frequent cues during most exercises to avoid painful ROM and remain w/in the limits of his precautions (esp no extension/IR behind the back motions).  Yida will benefit from continued skilled PT to address ongoing ROM and strength deficits to improve mobility and activity tolerance with decreased pain interference.   EVAL: JASIYAH PAULDING is a 78 y.o. male who was referred to physical therapy for evaluation  and treatment s/p L reverse TSA on 08/07/2023.  Patient reports nerve block wore off on POD #2 but pain has been mild (</= 3/10) since. Pain is worse with with perturbation to L UE.  Patient has deficits in L shoulder ROM and flexibility, B shoulder and postural strength, and abnormal posture which are interfering with ADLs and are impacting quality of life.  On QuickDASH patient scored 68.2/100 demonstrating 68.2% disability.  Potential for possible skin breakdown observed in L cubital fossa from moisture trapped while being in sling, therefore pt encouraged to spend a little more time out of the sling while seated to allow for air circulation to skin.  Amitai will benefit from skilled PT to address above deficits to improve mobility and activity tolerance with decreased pain interference.  OBJECTIVE IMPAIRMENTS: decreased activity tolerance, decreased knowledge of condition, decreased mobility, decreased ROM, decreased strength, increased fascial restrictions, impaired perceived functional ability, increased muscle spasms, impaired flexibility, improper body mechanics, postural dysfunction, and pain.   ACTIVITY LIMITATIONS: carrying, lifting, sleeping, bed mobility, bathing, toileting, dressing, self feeding,  reach over head, and caring for others  PARTICIPATION LIMITATIONS: meal prep, cleaning, laundry, driving, and community activity  PERSONAL FACTORS: Age, Fitness, Past/current experiences, Time since onset of injury/illness/exacerbation, and 3+ comorbidities: Aneurysm of ascending aorta, aortic stenosis, B sensorineural hearing loss - extremely HOH, CAD s/p CABG, cardiomyopathy, AVR, chronic anticoagulation, DM-II with neuropathy, CVA of MCA, HTN, expressive aphasia, gout, lumbar stenosis s/p lumbar laminectomy and decompression 2023, shoulder OA, CKD, ventral hernia, hernia repair, hip surgery, hx multiple strokes (3-4), prostate cancer  are also affecting patient's functional outcome.   REHAB  POTENTIAL: Good  CLINICAL DECISION MAKING: Evolving/moderate complexity  EVALUATION COMPLEXITY: Moderate   GOALS: Goals reviewed with patient? Yes  SHORT TERM GOALS: Target date: 09/26/2023  Patient will be independent with initial HEP to improve outcomes and carryover.  Baseline: Initial HEP provided on eval 08/30/23 - reviewed HEP and reinforced only P/AAROM at this phase of rehab protocol 09/14/23 - reviewed HEP and reinforced only P/AAROM at this phase of rehab protocol Goal status: MET - 09/20/23  2.  Patient will report 25% improvement in L shoulder pain to improve QOL.   Baseline: up to 3/10 Goal status: MET - 09/14/23 - 85% improved   3.  Patient will improve L shoulder flexion and scaption to >/=90 to improve functional use of L UE. Baseline: Refer to above UE ROM table Goal status: MET - 09/06/23 see ROM table  LONG TERM GOALS: Target date: 11/07/2023  Patient will be independent with ongoing/advanced HEP for self-management at home.  Baseline:  Goal status: IN PROGRESS - 09/25/23 - HEP updated today  2.  Patient will report 50-75% improvement in L shoulder pain to improve QOL.  Baseline: up to 3/10 Goal status: MET - 09/14/23 - 85% improved   3.  Patient to demonstrate improved upright posture with posterior shoulder girdle engaged to promote improved glenohumeral joint mobility. Baseline:  Goal status: INITIAL  4.  Patient to improve L shoulder AROM to [facility] without pain provocation to allow for increased ease of ADLs.  Baseline: Refer to above UE ROM table Goal status: INITIAL  5.  Patient will demonstrate improved L shoulder strength to >/= 4/5 for functional UE use. Baseline: Refer to above UE MMT table Goal status: INITIAL  6  Patient will report </= 50% on QuickDASH  to demonstrate improved functional ability.  Baseline: 68.2 / 100 = 68.2 % Goal status: INITIAL    PLAN:  PT FREQUENCY: 1-2x/week - 1x/wk for rehab phase I (1-4 wks), 1-2x/wk thereafter  PT  DURATION: 12 weeks  PLANNED INTERVENTIONS: 02835- PT Re-evaluation, 97750- Physical Performance Testing, 97110-Therapeutic exercises, 97530- Therapeutic activity, [id number]- Neuromuscular re-education, 97535- Self Care, 02859- Manual therapy, G0283- Electrical stimulation (unattended), 97016- Vasopneumatic device, [id number]- Ultrasound, 02966- Ionotophoresis 4mg /ml Dexamethasone , Patient/Family education, Taping, Joint mobilization, Cryotherapy, and Moist heat  PLAN FOR NEXT SESSION: Gradually progress into L reverse TSA protocol phase III (6-10 wks) - post-op week #8 as of 10/02/23 (surgery 08/07/23)  PRECAUTIONS: Shoulder -  Avoid resisted IR/backward extension until 3 months post-op to protect subscapularis repair Progress patient in scapular plane from PROM to AAROM to AROM ensuring proper kinematics throughout care to avoid over stress to deltoid/deltoid attachments    Elijah CHRISTELLA Hidden, PT 09/25/2023, 1:05 PM

## 2023-09-27 ENCOUNTER — Ambulatory Visit: Payer: Medicare HMO | Admitting: Physician Assistant

## 2023-09-27 NOTE — Telephone Encounter (Signed)
 Patient came in to office today and picked up 1 sample of receiver.

## 2023-10-02 ENCOUNTER — Ambulatory Visit: Payer: Medicare HMO | Admitting: Physician Assistant

## 2023-10-03 ENCOUNTER — Ambulatory Visit: Attending: Orthopedic Surgery

## 2023-10-03 DIAGNOSIS — M6281 Muscle weakness (generalized): Secondary | ICD-10-CM | POA: Insufficient documentation

## 2023-10-03 DIAGNOSIS — M25612 Stiffness of left shoulder, not elsewhere classified: Secondary | ICD-10-CM | POA: Diagnosis not present

## 2023-10-03 DIAGNOSIS — R2681 Unsteadiness on feet: Secondary | ICD-10-CM | POA: Diagnosis not present

## 2023-10-03 DIAGNOSIS — M25512 Pain in left shoulder: Secondary | ICD-10-CM | POA: Insufficient documentation

## 2023-10-03 DIAGNOSIS — R293 Abnormal posture: Secondary | ICD-10-CM | POA: Diagnosis not present

## 2023-10-03 DIAGNOSIS — Z7409 Other reduced mobility: Secondary | ICD-10-CM | POA: Insufficient documentation

## 2023-10-03 NOTE — Therapy (Signed)
 OUTPATIENT PHYSICAL THERAPY TREATMENT    Patient Name: [name] MRN: [id number] DOB:[birth year], 78 y.o., male Today's Date: 10/03/2023   END OF SESSION:  PT End of Session - 10/03/23 1016     Visit Number 8    Date for PT Re-Evaluation 11/07/23    Authorization Type [employer]    Progress Note Due on Visit 15   MD PN for 09/19/23 appt on visit #5 (09/14/23)   PT Start Time 0930    PT Stop Time 1013    PT Time Calculation (min) 43 min    Activity Tolerance Patient tolerated treatment well    Behavior During Therapy [facility] for tasks assessed/performed                 Past Medical History:  Diagnosis Date   Actinic keratosis 02/14/2011   Allergy    Aneurysm of ascending aorta without rupture (HCC) 01/06/2022   Aortic stenosis    Arthritis    Bilateral sensorineural hearing loss 06/16/2009   CAD (coronary artery disease)    cabg   Complication of anesthesia    Coronary atherosclerosis of native coronary artery 06/15/2009   Diabetic polyneuropathy 11/11/2018   Dyslipidemia 11/05/2018   Embolic stroke involving left middle cerebral artery  05/30/2021   Erectile dysfunction 05/18/2009   Essential hypertension 06/16/2009   Expressive aphasia 06/25/2021   External hemorrhoids 03/21/2010   Gout 04/09/2015   Heart murmur    Hemochromatosis 02/14/2011   History of aortic valve replacement 10/13/2009   History of CABG 11/05/2018   2005- 1. Coronary artery bypass grafting x4 with LIMA-LAD, SVG-OM1, SVG-AM and dCFX.  2. Aortic valve replacement with [employer] AVR 3. Septal myomectomy.  4. Myoview low risk 2016   History of hepatitis 01/31/1972   Hypertriglyceridemia 06/13/2010   Hypertrophic obstructive cardiomyopathy 05/06/2008   SP septal myomectomy at surgery 2005 Echo Nov 2018- Hyperdynamic LVEF with severe basal septal hypertrophy. There is chordal SAM with resting gradient of 16 mmHg that increases to 85 mmHg with Valsalva        Leg weakness, bilateral  01/18/2021   Long term (current) use of anticoagulants 05/22/2018   Lumbar stenosis with neurogenic claudication 02/04/2021   Malignant neoplasm of prostate 06/17/2009   Mechanical heart valve present    Manufacturer: Deitra Rosemary Blade #: [id number]  Model #: [id number]. Card states MRI compatible with 3 teslas or less.   Obesity, unspecified 04/29/2008   OSA (obstructive sleep apnea) 07/12/2012   moderate-severe; uses CPAP nightly   PONV (postoperative nausea and vomiting)    after CABG- slow to wake up   Presence of prosthetic heart valve    Primary osteoarthritis of left shoulder 10/31/2018   S/P lumbar laminectomy 02/04/2021   Stage 3 chronic kidney disease due to type 2 diabetes mellitus 11/11/2018   Tobacco use 06/16/2009   Type 2 diabetes mellitus with hyperglycemia    Ventral hernia 03/03/2014   Past Surgical History:  Procedure Laterality Date   AORTIC VALVE REPLACEMENT  11/14/2003   [facility]   APPENDECTOMY  1990   CHOLECYSTECTOMY  1990   CORONARY ANGIOGRAPHY  10/11/2021   CORONARY ARTERY BYPASS GRAFT  10/2003   HERNIA REPAIR  1999   right, inguinal   HERNIA REPAIR  2002   left, inguinal   HIP SURGERY  2006   right hip   IR CT HEAD LTD  02/03/2022   IR PERCUTANEOUS ART THROMBECTOMY/INFUSION INTRACRANIAL INC DIAG ANGIO  01/24/2022  LUMBAR LAMINECTOMY/DECOMPRESSION MICRODISCECTOMY Bilateral 02/04/2021   Procedure: Bilateral Lumbar Two-Three Laminectomy;  Surgeon: Colon Shove, MD;  Location: [facility] OR;  Service: Neurosurgery;  Laterality: Bilateral;  3C/RM 20   PILONIDAL CYST EXCISION  1964   prostate seed implant  03/2010   RADIOLOGY WITH ANESTHESIA N/A 01/24/2022   Procedure: IR WITH ANESTHESIA;  Surgeon: Radiologist, Medication, MD;  Location: [facility] OR;  Service: Radiology;  Laterality: N/A;   REVERSE SHOULDER ARTHROPLASTY Left 08/07/2023   Procedure: ARTHROPLASTY, SHOULDER, TOTAL, REVERSE;  Surgeon: Josefina Chew, MD;  Location: [facility] ORS;  Service: Orthopedics;   Laterality: Left;   TEE WITHOUT CARDIOVERSION N/A 03/14/2022   Procedure: TRANSESOPHAGEAL ECHOCARDIOGRAM (TEE);  Surgeon: Loni Soyla LABOR, MD;  Location: [facility] ENDOSCOPY;  Service: Cardiology;  Laterality: N/A;   TONSILLECTOMY  childhood   Patient Active Problem List   Diagnosis Date Noted   S/P reverse total shoulder arthroplasty, left 08/07/2023   Pre-op evaluation 01/17/2023   Left shoulder pain 11/17/2022   Hypertrophic cardiomyopathy (HCC) 08/12/2022   Chronic anticoagulation 08/12/2022   Hypotension 04/27/2022   H/O: CVA (cerebrovascular accident) 03/14/2022   Urinary retention 01/26/2022   Acute ischemic stroke (HCC) 01/24/2022   Middle cerebral artery embolism, left 01/24/2022   Aneurysm of ascending aorta without rupture (HCC) 01/06/2022   Type 2 diabetes mellitus with stage 3a chronic kidney disease, with long-term current use of insulin  (HCC) 10/30/2021   Type 2 diabetes mellitus with diabetic polyneuropathy, with long-term current use of insulin  (HCC) 10/30/2021   NSTEMI (non-ST elevated myocardial infarction) (HCC) 10/09/2021   Paroxysmal atrial fibrillation (HCC) 10/09/2021   Presence of prosthetic heart valve    Type 2 diabetes mellitus with hyperglycemia    Expressive aphasia 06/25/2021   History of CVA (cerebrovascular accident) 05/2021   S/P lumbar laminectomy 02/04/2021   Lumbar stenosis with neurogenic claudication 02/04/2021   History of falling 01/30/2021   Diabetic polyneuropathy 11/11/2018   Stage 3 chronic kidney disease due to type 2 diabetes mellitus 11/11/2018   History of CABG 11/05/2018   Dyslipidemia 11/05/2018   Primary osteoarthritis, left shoulder 10/31/2018   Long term (current) use of anticoagulants 05/22/2018   Ventral hernia 03/03/2014   OSA (obstructive sleep apnea) 07/12/2012   Hemochromatosis 02/14/2011   Actinic keratosis 02/14/2011   Hypertriglyceridemia 06/13/2010   External hemorrhoids 03/21/2010   History of aortic valve  replacement 10/13/2009   Malignant neoplasm of prostate (HCC) 06/17/2009   Bilateral sensorineural hearing loss 06/16/2009   Essential hypertension 06/16/2009   Coronary atherosclerosis of native coronary artery 06/15/2009   Erectile dysfunction 05/18/2009   Obesity, unspecified 04/29/2008    PCP: Daryl Setter, NP   REFERRING PROVIDER: Josefina Chew, MD   REFERRING DIAG: [phone] (ICD-10-CM) - Status post reverse total shoulder replacement, left  THERAPY DIAG:  Stiffness of left shoulder, not elsewhere classified  Acute pain of left shoulder  Muscle weakness (generalized)  Abnormal posture  RATIONALE FOR EVALUATION AND TREATMENT: Rehabilitation  ONSET DATE: 08/07/2023 - L reverse TSA  NEXT MD VISIT: 09/19/23   SUBJECTIVE:  SUBJECTIVE STATEMENT:  Shoulder only about a 1/10 right now, it does hurt while lying down. Pt with questions about setting up his home pulley system.  PAIN: Are you having pain? Yes: NPRS scale: 0/10 currently, intermittent up to 1/10 Pain location: L shoulder  Pain description: ache  Aggravating factors: perturbation to arm  Relieving factors: ice, Tylenol  PRN   PERTINENT HISTORY:  Aneurysm of ascending aorta, aortic stenosis, B sensorineural hearing loss - extremely HOH, CAD s/p CABG, cardiomyopathy, AVR, chronic anticoagulation, DM-II with neuropathy, CVA of MCA, HTN, expressive aphasia, gout, lumbar stenosis s/p lumbar laminectomy and decompression 2023, shoulder OA, CKD, ventral hernia, hernia repair, hip surgery, hx multiple strokes (3-4), prostate cancer   PRECAUTIONS: Shoulder -  Avoid resisted IR/backward extension until 3 months post-op to protect subscapularis repair Progress patient in scapular plane from PROM to AAROM to AROM ensuring  proper kinematics throughout care to avoid over stress to deltoid/deltoid attachments   RED FLAGS: None  HAND DOMINANCE: Right  WEIGHT BEARING RESTRICTIONS: Yes Phase I (1-4 wks): <1 lb; Phase II-IV (4-12 wks): <5 lbs; Phase V (12-16 wks): advance to tolerance; Phase VI (16 wks to 6 mo): Full  FALLS:  Has patient fallen in last 6 months? Yes. Number of falls 1 - fell in garden in May  LIVING ENVIRONMENT:  Lives with: lives with their family Lives in: House/apartment Stairs: No Has following equipment at home: Environmental consultant - 2 wheeled, Crutches, and Grab bars  OCCUPATION: Retired  PLOF: Independent with household mobility without device, Independent with community mobility with device, Needs assistance with ADLs, and Leisure: gardening, 2-3 days/wk at [employer]  PATIENT GOALS: To use normal ROM (for my arm) as much as I can. To not be in pain all the time.   OBJECTIVE: (objective measures completed at initial evaluation unless otherwise dated)  DIAGNOSTIC FINDINGS:  08/07/23 - DG Left Shoulder: CLINICAL DATA:  Status post reverse shoulder arthroplasty.   FINDINGS: Total left shoulder arthroplasty. The arthroplasty components appear intact and in anatomic alignment. There is no acute fracture ordislocation. Postsurgical changes in the soft tissues of the leftshoulder.   IMPRESSION: Total left shoulder arthroplasty.  PATIENT SURVEYS:  Quick Dash: 68.2 / 100 = 68.2 % Minimally Clinically Important Difference (MCID): 15-20 points  COGNITION: Overall cognitive status: Within functional limits for tasks assessed     SENSATION: WFL  POSTURE: rounded shoulders and forward head  UPPER EXTREMITY ROM:   Passive ROM Left eval L 08/30/23 L 09/06/23 L 09/14/23 L 10/03/23  Shoulder flexion 58 90 110 119 120  Shoulder extension       Shoulder abduction 50 scaption 90 scaption 105 117 scaption 128  Shoulder adduction       Shoulder internal rotation       Shoulder external rotation  -6 from neutral  24 at ~30 scaption 21 31 at ~30 scaption 30   Active ROM Right  Left   Shoulder flexion    Shoulder extension    Shoulder abduction    Shoulder adduction    Shoulder internal rotation    Shoulder external rotation    Elbow flexion    Elbow extension    Wrist flexion    Wrist extension    Wrist ulnar deviation    Wrist radial deviation    Wrist pronation    Wrist supination    (Blank rows = not tested)  UPPER EXTREMITY MMT: (deferred on eval d/t only 1 week postop)  MMT Right  Left  Shoulder flexion    Shoulder extension    Shoulder abduction    Shoulder adduction    Shoulder internal rotation    Shoulder external rotation    Middle trapezius    Lower trapezius    Elbow flexion    Elbow extension    Wrist flexion    Wrist extension    Wrist ulnar deviation    Wrist radial deviation    Wrist pronation    Wrist supination    Grip strength (lbs)    (Blank rows = not tested)  JOINT MOBILITY TESTING:  Patient very guarded with initial PROM  PALPATION/OBSERVATION: Bruising draining into upper arm.  Skin at cubital fossa pale and slightly moist/macerated.    TODAY'S TREATMENT:  10/03/2023  THERAPEUTIC ACTIVITY: to improve ability to reach overhead Pulleys - flexion and scaption x 3' each - cues to remain in pain free ROM Supine inclined shoulder AAROM flexion with wand x 10 Supine inclined shoulder AROM flexion 2 x 10 Supine inclined shoulder AROM scaption with wand 2 x 10  NEUROMUSCULAR RE-EDUCATION: To improve coordination, kinesthesia, posture, and proprioception. Standing YTB row 3 x 10 (VC & TC to avoid shoulder shrug) Standing YTB shoulder extension to neutral 3 x 10 Prone scapular retraction with (cues to avoid shoulder shrug): Row to neutral 3 x 10 Shoulder extension to neutral 3 x 10  09/25/2023  THERAPEUTIC EXERCISE: To improve strength, endurance, ROM, and flexibility.  Demonstration, verbal and tactile cues throughout for  technique. Pulleys - flexion and scaption x 3' each - cues to remain in pain free ROM Supine L shoulder AROM flexion with elbow flexed (uppercut) 2 x 10 Supine L shoulder AROM scaption with elbow flexed (hitchhiker) 2 x 10  NEUROMUSCULAR RE-EDUCATION: To improve coordination, kinesthesia, posture, and proprioception. Standing YTB scap retraction + B shoulder row 2 x 10 (VC & TC to avoid shoulder shrug) Standing YTB scap retraction + B shoulder extension to neutral 2 x 10 Prone scapular retraction with (cues to avoid shoulder shrug): Row to neutral 2 x 10 Shoulder extension to neutral 2 x 10 L shoulder rhythmic stabilization at 90 flexion x 30 sec with 2-finger perturbation at elbow, 2 x 30 sec with 2-finger perturbation at distal forearm   09/20/2023  THERAPEUTIC EXERCISE: To improve strength, endurance, ROM, and flexibility.  Demonstration, verbal and tactile cues throughout for technique.  Supine L shoulder AROM flexion with elbow flexed (uppercut) 2 x 10 Supine L shoulder AROM scaption with elbow flexed (hitchhiker) 2 x 10 Supine L shoulder protraction at 90 flexion 2 x 10  NEUROMUSCULAR RE-EDUCATION: To improve coordination and kinesthesia. L shoulder rhythmic stabilization at 90 flexion x 30 sec with 2-finger perturbation at elbow, x 30 sec with 2-finger perturbation at distal  Prone scapular retraction with (cues to avoid shoulder shrug): Row to neutral 2 x 10 Shoulder extension to neutral 2 x 10   09/14/2023  THERAPEUTIC EXERCISE: To improve strength, ROM, and flexibility.  Demonstration, verbal and tactile cues throughout for technique. PROM to L shoulder Flex, scap, ER within protocol limits Supine wand AAROM within protocol limits - cues to avoid forcing painful ROM: Scaption with flexed elbows x 10 Flexion with flexed elbows x 10 ER at 30 scaption/abduction with upper arm elevated on towel roll x 10 Seated self-resisted L shoulder submax isometrics - reminders to limit  effort to ~50%: Flexion 5 x 5 ABD 5 x 5 ER 5 x 5 Seated scap retraction 15 x 5 - cues to  avoid shoulder shrug (decreased scapular activation noted R > L)  MANUAL THERAPY: To promote normalized muscle tension, improved flexibility, increased ROM, and pain modulation utilizing joint mobilization, connective tissue massage, therapeutic massage, and manual TP therapy.  Gentle oscillation L GH joint to promote muscle relaxation STM/DTM and manual TPR to L deltoids   09/06/23 THERAPEUTIC EXERCISE: To improve ROM and flexibility.  Demonstration, verbal and tactile cues throughout for technique. Seated L shoulder flexion slide with red pball x 20  Seated L shoulder scaption slide with red pball x 20  Shoulder isometrics self resisted: flex, abd, ER, ext 5x5 each, also demo'd wall isometrics, pt preferred self resisted version for HEP Seated scap retraction 15x5- cues to depress shoulders  PROM to L shoulder Flex, scap, ER within protocol limits   08/30/2023  THERAPEUTIC EXERCISE: To improve ROM and flexibility.  Demonstration, verbal and tactile cues throughout for technique. Seated L shoulder flexion towel slide on table x 10 - cues to use body lean to create motion at shoulder Seated L shoulder scaption towel slide on table x 10 - cues to use body lean to create motion at shoulder Seated L shoulder flexion P/AAROM flexion using hand on Swiffer post and fwd body lean x 10 Seated L shoulder flexion P/AAROM scaption using hand on Swiffer post and fwd body lean x 10 Supine L shoulder PROM flex, abd in scapular plane, ER within limits of protocol x multiple reps each Supine with pillow under L humerus L elbow extension stretch & L elbow flex/ext AROM  MANUAL THERAPY: To promote normalized muscle tension, pain modulation, and reduced edema utilizing connective tissue massage and therapeutic massage. Supine with pillow under L humerus: retrograde massage L upper arm to reduce edema STM/DTM and  manual TPR to reduce muscle tension in deltoids  NEUROMUSCULAR RE-EDUCATION: To improve kinesthesia and posture. Seated scapular retraction 10 x 5 - cues to avoid shoulder shrug and limit shoulder extensiont o neutral   SELF CARE: Provided education on PT POC progression, on post-surgical precautions, and ongoing limits of ROM per rehab protocol.  L shoulder PROM demonstrated for wife to assist at home.   08/23/23:  Manual and therapeutic exercise combined:  Supine with pillow under L humerus, retrograde massage L upper arm to reduce edema AAROM/PROM L shoulder flex, abd in scapular plane, ER multiple reps each Supine for L elbow extension /flexion, AAROM, L elbow ext with slight overpressure by PT  Side lying R for scapular clocks, therapist providing tactile cues/resistance medial border of scapula 5 rounds  Sitting and pt demonstrated table slides, needed correction for technique for the scaption tends to want to move into abduction and ext Seated L shoulder ER with cane  Instructed for home to engage in seated scapular retraction Instructed in semi reclined L elbow ext /flex to improve stiffness L elbow.    08/15/2023  SELF CARE:  Reviewed eval findings and role of PT in addressing identified deficits as well as instruction in initial HEP (see below).  Encouraged pt to spend a little more time out of sling while seated to allow for more air circulation at cubital fossa to prevent skin breakdown.   THERAPEUTIC EXERCISE: To improve ROM and flexibility.  Demonstration, verbal and tactile cues throughout for technique.  Seated L shoulder flexion towel slide on table x 10 - cues to use body lean to create motion at shoulder Seated L shoulder scaption towel slide on table x 10 - cues to use body lean to create  motion at shoulder Seated L shoulder flexion P/AAROM flexion using hand on Swiffer post and fwd body lean x 10 Seated L shoulder flexion P/AAROM scaption using hand on Swiffer post  and fwd body lean x 10 Seated L shoulder ER P/AAROM with cane and hand in neutral working on shoulder ER to neutral x 10   PATIENT EDUCATION:  Education details: HEP update - YTB scap retraction, postural awareness, and reminders for surgical protocol precautions  Person educated: Patient Education method: Explanation, Demonstration, Verbal cues, and MedBridgeGO app updated Education comprehension: verbalized understanding, returned demonstration, verbal cues required, and needs further education  HOME EXERCISE PROGRAM: *Pt using MedBridgeGO app  Access Code: [email or web address] URL: [email or web address] Date: 09/25/2023 Prepared by: Elijah Hidden  Exercises - Seated Shoulder Flexion Towel Slide at Table Top  - 2-3 x daily - 7 x weekly - 2 sets - 10 reps - 3 sec  hold - Seated Shoulder Scaption Slide at Table Top with Forearm in Neutral  - 2-3 x daily - 7 x weekly - 2 sets - 10 reps - 6 sec hold - Seated Shoulder External Rotation AAROM with Cane and Hand in Neutral (Mirrored)  - 2-3 x daily - 7 x weekly - 2 sets - 10 reps - 3 sec hold - Seated Shoulder Flexion Extension AAROM with Dowel into Wall  - 2-3 x daily - 7 x weekly - 2 sets - 10 reps - 6 sec hold - Seated Scapular Retraction  - 2 x daily - 7 x weekly - 2 sets - 10 reps - 3-5 sec hold - Seated Isometric Shoulder Flexion  - 2 x daily - 7 x weekly - 2 sets - 5 reps - 5 sec hold - Isometric Shoulder Abduction  - 2 x daily - 7 x weekly - 2 sets - 5 reps - 5 sec hold - Seated Isometric Shoulder External Rotation  - 2 x daily - 7 x weekly - 2 sets - 5 reps - 5 sec hold - Supine Shoulder Flexion AAROM with Dowel  - 1 x daily - 7 x weekly - 2 sets - 10 reps - 3 sec hold - Standing Shoulder Row with Anchored Resistance  - 1 x daily - 7 x weekly - 2 sets - 10 reps - 5 sec hold - Shoulder extension with resistance - Neutral  - 1 x daily - 7 x weekly - 2 sets - 10 reps - 5 sec hold   ASSESSMENT:  CLINICAL IMPRESSION: Danyael  continues to require frequent cues during most exercises to avoid painful ROM and remain w/in the limits of his precautions (esp no extension/IR behind the back motions). We increased the reps for his exercises today. Gave some alternatives for the prone exercises that he could do at home. AROM is looking very good at this point. Octavious will benefit from continued skilled PT to address ongoing ROM and strength deficits to improve mobility and activity tolerance with decreased pain interference.   EVAL: AKIEM URIETA is a 78 y.o. male who was referred to physical therapy for evaluation and treatment s/p L reverse TSA on 08/07/2023.  Patient reports nerve block wore off on POD #2 but pain has been mild (</= 3/10) since. Pain is worse with with perturbation to L UE.  Patient has deficits in L shoulder ROM and flexibility, B shoulder and postural strength, and abnormal posture which are interfering with ADLs and are impacting quality of life.  On QuickDASH patient scored 68.2/100 demonstrating 68.2%  disability.  Potential for possible skin breakdown observed in L cubital fossa from moisture trapped while being in sling, therefore pt encouraged to spend a little more time out of the sling while seated to allow for air circulation to skin.  [place] will benefit from skilled PT to address above deficits to improve mobility and activity tolerance with decreased pain interference.  OBJECTIVE IMPAIRMENTS: decreased activity tolerance, decreased knowledge of condition, decreased mobility, decreased ROM, decreased strength, increased fascial restrictions, impaired perceived functional ability, increased muscle spasms, impaired flexibility, improper body mechanics, postural dysfunction, and pain.   ACTIVITY LIMITATIONS: carrying, lifting, sleeping, bed mobility, bathing, toileting, dressing, self feeding, reach over head, and caring for others  PARTICIPATION LIMITATIONS: meal prep, cleaning, laundry, driving, and  community activity  PERSONAL FACTORS: Age, Fitness, Past/current experiences, Time since onset of injury/illness/exacerbation, and 3+ comorbidities: Aneurysm of ascending aorta, aortic stenosis, B sensorineural hearing loss - extremely HOH, CAD s/p CABG, cardiomyopathy, AVR, chronic anticoagulation, DM-II with neuropathy, CVA of MCA, HTN, expressive aphasia, gout, lumbar stenosis s/p lumbar laminectomy and decompression 2023, shoulder OA, CKD, ventral hernia, hernia repair, hip surgery, hx multiple strokes (3-4), prostate cancer  are also affecting patient's functional outcome.   REHAB POTENTIAL: Good  CLINICAL DECISION MAKING: Evolving/moderate complexity  EVALUATION COMPLEXITY: Moderate   GOALS: Goals reviewed with patient? Yes  SHORT TERM GOALS: Target date: 09/26/2023  Patient will be independent with initial HEP to improve outcomes and carryover.  Baseline: Initial HEP provided on eval 08/30/23 - reviewed HEP and reinforced only P/AAROM at this phase of rehab protocol 09/14/23 - reviewed HEP and reinforced only P/AAROM at this phase of rehab protocol Goal status: MET - 09/20/23  2.  Patient will report 25% improvement in L shoulder pain to improve QOL.   Baseline: up to 3/10 Goal status: MET - 09/14/23 - 85% improved   3.  Patient will improve L shoulder flexion and scaption to >/=90 to improve functional use of L UE. Baseline: Refer to above UE ROM table Goal status: MET - 09/06/23 see ROM table  LONG TERM GOALS: Target date: 11/07/2023  Patient will be independent with ongoing/advanced HEP for self-management at home.  Baseline:  Goal status: IN PROGRESS - 09/25/23 - HEP updated today  2.  Patient will report 50-75% improvement in L shoulder pain to improve QOL.  Baseline: up to 3/10 Goal status: MET - 09/14/23 - 85% improved   3.  Patient to demonstrate improved upright posture with posterior shoulder girdle engaged to promote improved glenohumeral joint mobility. Baseline:   Goal status: INITIAL  4.  Patient to improve L shoulder AROM to [facility] without pain provocation to allow for increased ease of ADLs.  Baseline: Refer to above UE ROM table Goal status: INITIAL  5.  Patient will demonstrate improved L shoulder strength to >/= 4/5 for functional UE use. Baseline: Refer to above UE MMT table Goal status: INITIAL  6  Patient will report </= 50% on QuickDASH  to demonstrate improved functional ability.  Baseline: 68.2 / 100 = 68.2 % Goal status: INITIAL    PLAN:  PT FREQUENCY: 1-2x/week - 1x/wk for rehab phase I (1-4 wks), 1-2x/wk thereafter  PT DURATION: 12 weeks  PLANNED INTERVENTIONS: 97164- PT Re-evaluation, 97750- Physical Performance Testing, 97110-Therapeutic exercises, 97530- Therapeutic activity, [id number]- Neuromuscular re-education, 97535- Self Care, [id number]- Manual therapy, [id number]- Electrical stimulation (unattended), 97016- Vasopneumatic device, [id number]- Ultrasound, 02966- Ionotophoresis 4mg /ml Dexamethasone , Patient/Family education, Taping, Joint mobilization, Cryotherapy, and Moist heat  PLAN FOR NEXT SESSION:  Gradually progress into L reverse TSA protocol phase III (6-10 wks) - post-op week #8 as of 10/02/23 (surgery 08/07/23)  PRECAUTIONS: Shoulder -  Avoid resisted IR/backward extension until 3 months post-op to protect subscapularis repair Progress patient in scapular plane from PROM to AAROM to AROM ensuring proper kinematics throughout care to avoid over stress to deltoid/deltoid attachments    Sol LITTIE Gaskins, PTA 10/03/2023, 10:25 AM

## 2023-10-04 ENCOUNTER — Ambulatory Visit: Attending: Cardiology | Admitting: *Deleted

## 2023-10-04 DIAGNOSIS — Z7901 Long term (current) use of anticoagulants: Secondary | ICD-10-CM

## 2023-10-04 DIAGNOSIS — Z952 Presence of prosthetic heart valve: Secondary | ICD-10-CM

## 2023-10-04 LAB — POCT INR: INR: 3.2 — AB (ref 2.0–3.0)

## 2023-10-04 NOTE — Patient Instructions (Signed)
 Description   Continue taking warfarin 1 tablet daily except 1/2 tablet on Mondays, Wednesdays, and Fridays. Remain consistent with leafy green (increase intake to 4 servings/week).  Recheck INR in 4 weeks Coumadin  Clinic [phone].  Clearance Fax # [phone]

## 2023-10-04 NOTE — Progress Notes (Signed)
 Description   Continue taking warfarin 1 tablet daily except 1/2 tablet on Mondays, Wednesdays, and Fridays. Remain consistent with leafy green (increase intake to 4 servings/week).  Recheck INR in 4 weeks Coumadin  Clinic [phone].  Clearance Fax # [phone]

## 2023-10-05 ENCOUNTER — Ambulatory Visit: Admitting: Physical Therapy

## 2023-10-05 DIAGNOSIS — M25512 Pain in left shoulder: Secondary | ICD-10-CM | POA: Diagnosis not present

## 2023-10-05 DIAGNOSIS — R293 Abnormal posture: Secondary | ICD-10-CM

## 2023-10-05 DIAGNOSIS — M6281 Muscle weakness (generalized): Secondary | ICD-10-CM

## 2023-10-05 DIAGNOSIS — M25612 Stiffness of left shoulder, not elsewhere classified: Secondary | ICD-10-CM

## 2023-10-05 DIAGNOSIS — Z7409 Other reduced mobility: Secondary | ICD-10-CM | POA: Diagnosis not present

## 2023-10-05 DIAGNOSIS — R2681 Unsteadiness on feet: Secondary | ICD-10-CM | POA: Diagnosis not present

## 2023-10-05 NOTE — Therapy (Signed)
 OUTPATIENT PHYSICAL THERAPY TREATMENT    Patient Name: [name] MRN: [id number] DOB:September 10, 1945, 78 y.o., male Today's Date: 10/05/2023   END OF SESSION:  PT End of Session - 10/05/23 0936     Visit Number 9    Date for PT Re-Evaluation 11/07/23    Authorization Type [employer]    Progress Note Due on Visit 15   MD PN for 09/19/23 appt on visit #5 (09/14/23)   PT Start Time 0936   Pt arrived late   PT Stop Time 1019    PT Time Calculation (min) 43 min    Activity Tolerance Patient tolerated treatment well    Behavior During Therapy [facility] for tasks assessed/performed              Past Medical History:  Diagnosis Date   Actinic keratosis 02/14/2011   Allergy    Aneurysm of ascending aorta without rupture (HCC) 01/06/2022   Aortic stenosis    Arthritis    Bilateral sensorineural hearing loss 06/16/2009   CAD (coronary artery disease)    cabg   Complication of anesthesia    Coronary atherosclerosis of native coronary artery 06/15/2009   Diabetic polyneuropathy 11/11/2018   Dyslipidemia 11/05/2018   Embolic stroke involving left middle cerebral artery  05/30/2021   Erectile dysfunction 05/18/2009   Essential hypertension 06/16/2009   Expressive aphasia 06/25/2021   External hemorrhoids 03/21/2010   Gout 04/09/2015   Heart murmur    Hemochromatosis 02/14/2011   History of aortic valve replacement 10/13/2009   History of CABG 11/05/2018   2005- 1. Coronary artery bypass grafting x4 with LIMA-LAD, SVG-OM1, SVG-AM and dCFX.  2. Aortic valve replacement with [employer] AVR 3. Septal myomectomy.  4. Myoview low risk 2016   History of hepatitis 01/31/1972   Hypertriglyceridemia 06/13/2010   Hypertrophic obstructive cardiomyopathy 05/06/2008   SP septal myomectomy at surgery 2005 Echo Nov 2018- Hyperdynamic LVEF with severe basal septal hypertrophy. There is chordal SAM with resting gradient of 16 mmHg that increases to 85 mmHg with Valsalva        Leg weakness,  bilateral 01/18/2021   Long term (current) use of anticoagulants 05/22/2018   Lumbar stenosis with neurogenic claudication 02/04/2021   Malignant neoplasm of prostate 06/17/2009   Mechanical heart valve present    Manufacturer: Deitra Rosemary Blade #: [id number]  Model #: [id number]. Card states MRI compatible with 3 teslas or less.   Obesity, unspecified 04/29/2008   OSA (obstructive sleep apnea) 07/12/2012   moderate-severe; uses CPAP nightly   PONV (postoperative nausea and vomiting)    after CABG- slow to wake up   Presence of prosthetic heart valve    Primary osteoarthritis of left shoulder 10/31/2018   S/P lumbar laminectomy 02/04/2021   Stage 3 chronic kidney disease due to type 2 diabetes mellitus 11/11/2018   Tobacco use 06/16/2009   Type 2 diabetes mellitus with hyperglycemia    Ventral hernia 03/03/2014   Past Surgical History:  Procedure Laterality Date   AORTIC VALVE REPLACEMENT  11/14/2003   [facility]   APPENDECTOMY  1990   CHOLECYSTECTOMY  1990   CORONARY ANGIOGRAPHY  10/11/2021   CORONARY ARTERY BYPASS GRAFT  10/2003   HERNIA REPAIR  1999   right, inguinal   HERNIA REPAIR  2002   left, inguinal   HIP SURGERY  2006   right hip   IR CT HEAD LTD  02/03/2022   IR PERCUTANEOUS ART THROMBECTOMY/INFUSION INTRACRANIAL INC DIAG ANGIO  01/24/2022   LUMBAR LAMINECTOMY/DECOMPRESSION MICRODISCECTOMY Bilateral 02/04/2021   Procedure: Bilateral Lumbar Two-Three Laminectomy;  Surgeon: Colon Shove, MD;  Location: [facility] OR;  Service: Neurosurgery;  Laterality: Bilateral;  3C/RM 20   PILONIDAL CYST EXCISION  1964   prostate seed implant  03/2010   RADIOLOGY WITH ANESTHESIA N/A 01/24/2022   Procedure: IR WITH ANESTHESIA;  Surgeon: Radiologist, Medication, MD;  Location: [facility] OR;  Service: Radiology;  Laterality: N/A;   REVERSE SHOULDER ARTHROPLASTY Left 08/07/2023   Procedure: ARTHROPLASTY, SHOULDER, TOTAL, REVERSE;  Surgeon: David Chew, MD;  Location: [facility] ORS;  Service:  Orthopedics;  Laterality: Left;   TEE WITHOUT CARDIOVERSION N/A 03/14/2022   Procedure: TRANSESOPHAGEAL ECHOCARDIOGRAM (TEE);  Surgeon: David Soyla LABOR, MD;  Location: [facility] ENDOSCOPY;  Service: Cardiology;  Laterality: N/A;   TONSILLECTOMY  childhood   Patient Active Problem List   Diagnosis Date Noted   S/P reverse total shoulder arthroplasty, left 08/07/2023   Pre-op evaluation 01/17/2023   Left shoulder pain 11/17/2022   Hypertrophic cardiomyopathy (HCC) 08/12/2022   Chronic anticoagulation 08/12/2022   Hypotension 04/27/2022   H/O: CVA (cerebrovascular accident) 03/14/2022   Urinary retention 01/26/2022   Acute ischemic stroke (HCC) 01/24/2022   Middle cerebral artery embolism, left 01/24/2022   Aneurysm of ascending aorta without rupture (HCC) 01/06/2022   Type 2 diabetes mellitus with stage 3a chronic kidney disease, with long-term current use of insulin  (HCC) 10/30/2021   Type 2 diabetes mellitus with diabetic polyneuropathy, with long-term current use of insulin  (HCC) 10/30/2021   NSTEMI (non-ST elevated myocardial infarction) (HCC) 10/09/2021   Paroxysmal atrial fibrillation (HCC) 10/09/2021   Presence of prosthetic heart valve    Type 2 diabetes mellitus with hyperglycemia    Expressive aphasia 06/25/2021   History of CVA (cerebrovascular accident) 05/2021   S/P lumbar laminectomy 02/04/2021   Lumbar stenosis with neurogenic claudication 02/04/2021   History of falling 01/30/2021   Diabetic polyneuropathy 11/11/2018   Stage 3 chronic kidney disease due to type 2 diabetes mellitus 11/11/2018   History of CABG 11/05/2018   Dyslipidemia 11/05/2018   Primary osteoarthritis, left shoulder 10/31/2018   Long term (current) use of anticoagulants 05/22/2018   Ventral hernia 03/03/2014   OSA (obstructive sleep apnea) 07/12/2012   Hemochromatosis 02/14/2011   Actinic keratosis 02/14/2011   Hypertriglyceridemia 06/13/2010   External hemorrhoids 03/21/2010   History of aortic  valve replacement 10/13/2009   Malignant neoplasm of prostate (HCC) 06/17/2009   Bilateral sensorineural hearing loss 06/16/2009   Essential hypertension 06/16/2009   Coronary atherosclerosis of native coronary artery 06/15/2009   Erectile dysfunction 05/18/2009   Obesity, unspecified 04/29/2008    PCP: Daryl Setter, NP   REFERRING PROVIDER: Josefina Chew, MD   REFERRING DIAG: [phone] (ICD-10-CM) - Status post reverse total shoulder replacement, left  THERAPY DIAG:  Stiffness of left shoulder, not elsewhere classified  Acute pain of left shoulder  Muscle weakness (generalized)  Abnormal posture  RATIONALE FOR EVALUATION AND TREATMENT: Rehabilitation  ONSET DATE: 08/07/2023 - L reverse TSA  NEXT MD VISIT: 09/19/23   SUBJECTIVE:  SUBJECTIVE STATEMENT:  Shoulder only about a 1/10 right now, it does hurt while lying down. Pt with questions about setting up his home pulley system.  PAIN: Are you having pain? Yes: NPRS scale: 0/10 currently, intermittent up to 1/10 Pain location: L shoulder  Pain description: ache  Aggravating factors: perturbation to arm  Relieving factors: ice, Tylenol  PRN   PERTINENT HISTORY:  Aneurysm of ascending aorta, aortic stenosis, B sensorineural hearing loss - extremely HOH, CAD s/p CABG, cardiomyopathy, AVR, chronic anticoagulation, DM-II with neuropathy, CVA of MCA, HTN, expressive aphasia, gout, lumbar stenosis s/p lumbar laminectomy and decompression 2023, shoulder OA, CKD, ventral hernia, hernia repair, hip surgery, hx multiple strokes (3-4), prostate cancer   PRECAUTIONS: Shoulder -  Avoid resisted IR/backward extension until 3 months post-op to protect subscapularis repair Progress patient in scapular plane from PROM to AAROM to AROM  ensuring proper kinematics throughout care to avoid over stress to deltoid/deltoid attachments   RED FLAGS: None  HAND DOMINANCE: Right  WEIGHT BEARING RESTRICTIONS: Yes Phase I (1-4 wks): <1 lb; Phase II-IV (4-12 wks): <5 lbs; Phase V (12-16 wks): advance to tolerance; Phase VI (16 wks to 6 mo): Full  FALLS:  Has patient fallen in last 6 months? Yes. Number of falls 1 - fell in garden in May  LIVING ENVIRONMENT:  Lives with: lives with their family Lives in: House/apartment Stairs: No Has following equipment at home: Environmental consultant - 2 wheeled, Crutches, and Grab bars  OCCUPATION: Retired  PLOF: Independent with household mobility without device, Independent with community mobility with device, Needs assistance with ADLs, and Leisure: gardening, 2-3 days/wk at [employer]  PATIENT GOALS: To use normal ROM (for my arm) as much as I can. To not be in pain all the time.   OBJECTIVE: (objective measures completed at initial evaluation unless otherwise dated)  DIAGNOSTIC FINDINGS:  08/07/23 - DG Left Shoulder: CLINICAL DATA:  Status post reverse shoulder arthroplasty.   FINDINGS: Total left shoulder arthroplasty. The arthroplasty components appear intact and in anatomic alignment. There is no acute fracture ordislocation. Postsurgical changes in the soft tissues of the leftshoulder.   IMPRESSION: Total left shoulder arthroplasty.  PATIENT SURVEYS:  Quick Dash: 68.2 / 100 = 68.2 % Minimally Clinically Important Difference (MCID): 15-20 points  COGNITION: Overall cognitive status: Within functional limits for tasks assessed     SENSATION: WFL  POSTURE: rounded shoulders and forward head  UPPER EXTREMITY ROM:   Passive ROM Left eval L 08/30/23 L 09/06/23 L 09/14/23 L 10/03/23  Shoulder flexion 58 90 110 119 120  Shoulder extension       Shoulder abduction 50 scaption 90 scaption 105 117 scaption 128  Shoulder adduction       Shoulder internal rotation       Shoulder external  rotation -6 from neutral  24 at ~30 scaption 21 31 at ~30 scaption 30   Active ROM Right  Left   Shoulder flexion    Shoulder extension    Shoulder abduction    Shoulder adduction    Shoulder internal rotation    Shoulder external rotation    Elbow flexion    Elbow extension    Wrist flexion    Wrist extension    Wrist ulnar deviation    Wrist radial deviation    Wrist pronation    Wrist supination    (Blank rows = not tested)  UPPER EXTREMITY MMT: (deferred on eval d/t only 1 week postop)  MMT Right  Left  Shoulder flexion    Shoulder extension    Shoulder abduction    Shoulder adduction    Shoulder internal rotation    Shoulder external rotation    Middle trapezius    Lower trapezius    Elbow flexion    Elbow extension    Wrist flexion    Wrist extension    Wrist ulnar deviation    Wrist radial deviation    Wrist pronation    Wrist supination    Grip strength (lbs)    (Blank rows = not tested)  JOINT MOBILITY TESTING:  Patient very guarded with initial PROM  PALPATION/OBSERVATION: Bruising draining into upper arm.  Skin at cubital fossa pale and slightly moist/macerated.    TODAY'S TREATMENT:   10/05/2023 THERAPEUTIC ACTIVITIES: To improve functional performance with overhead reach.  Demonstration, verbal and tactile cues throughout for technique. Pulleys - flexion and scaption x 3' each - cues to remain in pain free ROM  NEUROMUSCULAR RE-EDUCATION: To improve coordination, kinesthesia, posture, proprioception, and amplitude of movement. Seated bent over prone scapular retraction with (cues to avoid shoulder shrug and avoid extension ROM beyond neutral): Row to neutral 2 x 10 Shoulder extension to neutral 2 x 10 L shoulder rhythmic stabilization at 90 flexion x 30 sec with 2-finger perturbation at elbow, 2 x 30 sec with 2-finger perturbation at distal forearm Supine L shoulder CW/CCW small circles at 90 flexion 2 x 10 each direction (cues to keep  circular motion small amplitude) L shoulder YTB reactive isometrics step-outs - neutral shoulder with elbow at 90: Flexion x 10 IR x 10 ER x 5   10/03/2023  THERAPEUTIC ACTIVITY: to improve ability to reach overhead Pulleys - flexion and scaption x 3' each - cues to remain in pain free ROM Supine inclined shoulder AAROM flexion with wand x 10 Supine inclined shoulder AROM flexion 2 x 10 Supine inclined shoulder AROM scaption with wand 2 x 10  NEUROMUSCULAR RE-EDUCATION: To improve coordination, kinesthesia, posture, and proprioception. Standing YTB row 3 x 10 (VC & TC to avoid shoulder shrug) Standing YTB shoulder extension to neutral 3 x 10 Prone scapular retraction with (cues to avoid shoulder shrug): Row to neutral 3 x 10 Shoulder extension to neutral 3 x 10  09/25/2023  THERAPEUTIC EXERCISE: To improve strength, endurance, ROM, and flexibility.  Demonstration, verbal and tactile cues throughout for technique. Pulleys - flexion and scaption x 3' each - cues to remain in pain free ROM Supine L shoulder AROM flexion with elbow flexed (uppercut) 2 x 10 Supine L shoulder AROM scaption with elbow flexed (hitchhiker) 2 x 10  NEUROMUSCULAR RE-EDUCATION: To improve coordination, kinesthesia, posture, and proprioception. Standing YTB scap retraction + B shoulder row 2 x 10 (VC & TC to avoid shoulder shrug) Standing YTB scap retraction + B shoulder extension to neutral 2 x 10 Prone scapular retraction with (cues to avoid shoulder shrug): Row to neutral 2 x 10 Shoulder extension to neutral 2 x 10 L shoulder rhythmic stabilization at 90 flexion x 30 sec with 2-finger perturbation at elbow, 2 x 30 sec with 2-finger perturbation at distal forearm   09/20/2023  THERAPEUTIC EXERCISE: To improve strength, endurance, ROM, and flexibility.  Demonstration, verbal and tactile cues throughout for technique.  Supine L shoulder AROM flexion with elbow flexed (uppercut) 2 x 10 Supine L shoulder AROM  scaption with elbow flexed (hitchhiker) 2 x 10 Supine L shoulder protraction at 90 flexion 2 x 10  NEUROMUSCULAR RE-EDUCATION: To improve coordination and  kinesthesia. L shoulder rhythmic stabilization at 90 flexion x 30 sec with 2-finger perturbation at elbow, x 30 sec with 2-finger perturbation at distal  Prone scapular retraction with (cues to avoid shoulder shrug): Row to neutral 2 x 10 Shoulder extension to neutral 2 x 10   09/14/2023  THERAPEUTIC EXERCISE: To improve strength, ROM, and flexibility.  Demonstration, verbal and tactile cues throughout for technique. PROM to L shoulder Flex, scap, ER within protocol limits Supine wand AAROM within protocol limits - cues to avoid forcing painful ROM: Scaption with flexed elbows x 10 Flexion with flexed elbows x 10 ER at 30 scaption/abduction with upper arm elevated on towel roll x 10 Seated self-resisted L shoulder submax isometrics - reminders to limit effort to ~50%: Flexion 5 x 5 ABD 5 x 5 ER 5 x 5 Seated scap retraction 15 x 5 - cues to avoid shoulder shrug (decreased scapular activation noted R > L)  MANUAL THERAPY: To promote normalized muscle tension, improved flexibility, increased ROM, and pain modulation utilizing joint mobilization, connective tissue massage, therapeutic massage, and manual TP therapy.  Gentle oscillation L GH joint to promote muscle relaxation STM/DTM and manual TPR to L deltoids   09/06/23 THERAPEUTIC EXERCISE: To improve ROM and flexibility.  Demonstration, verbal and tactile cues throughout for technique. Seated L shoulder flexion slide with red pball x 20  Seated L shoulder scaption slide with red pball x 20  Shoulder isometrics self resisted: flex, abd, ER, ext 5x5 each, also demo'd wall isometrics, pt preferred self resisted version for HEP Seated scap retraction 15x5- cues to depress shoulders  PROM to L shoulder Flex, scap, ER within protocol limits   08/30/2023  THERAPEUTIC EXERCISE:  To improve ROM and flexibility.  Demonstration, verbal and tactile cues throughout for technique. Seated L shoulder flexion towel slide on table x 10 - cues to use body lean to create motion at shoulder Seated L shoulder scaption towel slide on table x 10 - cues to use body lean to create motion at shoulder Seated L shoulder flexion P/AAROM flexion using hand on Swiffer post and fwd body lean x 10 Seated L shoulder flexion P/AAROM scaption using hand on Swiffer post and fwd body lean x 10 Supine L shoulder PROM flex, abd in scapular plane, ER within limits of protocol x multiple reps each Supine with pillow under L humerus L elbow extension stretch & L elbow flex/ext AROM  MANUAL THERAPY: To promote normalized muscle tension, pain modulation, and reduced edema utilizing connective tissue massage and therapeutic massage. Supine with pillow under L humerus: retrograde massage L upper arm to reduce edema STM/DTM and manual TPR to reduce muscle tension in deltoids  NEUROMUSCULAR RE-EDUCATION: To improve kinesthesia and posture. Seated scapular retraction 10 x 5 - cues to avoid shoulder shrug and limit shoulder extensiont o neutral   SELF CARE: Provided education on PT POC progression, on post-surgical precautions, and ongoing limits of ROM per rehab protocol.  L shoulder PROM demonstrated for wife to assist at home.   08/23/23:  Manual and therapeutic exercise combined:  Supine with pillow under L humerus, retrograde massage L upper arm to reduce edema AAROM/PROM L shoulder flex, abd in scapular plane, ER multiple reps each Supine for L elbow extension /flexion, AAROM, L elbow ext with slight overpressure by PT  Side lying R for scapular clocks, therapist providing tactile cues/resistance medial border of scapula 5 rounds  Sitting and pt demonstrated table slides, needed correction for technique for the scaption tends  to want to move into abduction and ext Seated L shoulder ER with  cane  Instructed for home to engage in seated scapular retraction Instructed in semi reclined L elbow ext /flex to improve stiffness L elbow.    08/15/2023  SELF CARE:  Reviewed eval findings and role of PT in addressing identified deficits as well as instruction in initial HEP (see below).  Encouraged pt to spend a little more time out of sling while seated to allow for more air circulation at cubital fossa to prevent skin breakdown.   THERAPEUTIC EXERCISE: To improve ROM and flexibility.  Demonstration, verbal and tactile cues throughout for technique.  Seated L shoulder flexion towel slide on table x 10 - cues to use body lean to create motion at shoulder Seated L shoulder scaption towel slide on table x 10 - cues to use body lean to create motion at shoulder Seated L shoulder flexion P/AAROM flexion using hand on Swiffer post and fwd body lean x 10 Seated L shoulder flexion P/AAROM scaption using hand on Swiffer post and fwd body lean x 10 Seated L shoulder ER P/AAROM with cane and hand in neutral working on shoulder ER to neutral x 10   PATIENT EDUCATION:  Education details: HEP review, HEP update, postural awareness, and reminders for surgical protocol precautions  Person educated: Patient Education method: Explanation, Demonstration, Verbal cues, and MedBridgeGO app updated Education comprehension: verbalized understanding, returned demonstration, verbal cues required, and needs further education  HOME EXERCISE PROGRAM: *Pt using MedBridgeGO app  Access Code: [email or web address] URL: [email or web address] Date: 10/05/2023 Prepared by: Elijah Hidden  Exercises - Seated Shoulder Flexion AAROM with Pulley Behind  - 1 x daily - 7 x weekly - 3 min hold - Seated Shoulder Scaption AAROM with Pulley at Side  - 1 x daily - 7 x weekly - 3 min hold - Seated Shoulder Scaption Slide at Table Top with Forearm in Neutral  - 2-3 x daily - 7 x weekly - 2 sets - 10 reps - 6 sec hold - Seated  Shoulder External Rotation AAROM with Cane and Hand in Neutral (Mirrored)  - 2-3 x daily - 7 x weekly - 2 sets - 10 reps - 3 sec hold - Seated Shoulder Flexion Extension AAROM with Dowel into Wall  - 2-3 x daily - 7 x weekly - 2 sets - 10 reps - 6 sec hold - Seated Scapular Retraction  - 2 x daily - 7 x weekly - 2 sets - 10 reps - 3-5 sec hold - Seated Isometric Shoulder Flexion  - 2 x daily - 7 x weekly - 2 sets - 5 reps - 5 sec hold - Isometric Shoulder Abduction  - 2 x daily - 7 x weekly - 2 sets - 5 reps - 5 sec hold - Seated Isometric Shoulder External Rotation  - 2 x daily - 7 x weekly - 2 sets - 5 reps - 5 sec hold - Supine Shoulder Flexion AAROM with Dowel  - 1 x daily - 7 x weekly - 2 sets - 10 reps - 3 sec hold - Standing Shoulder Row with Anchored Resistance  - 1 x daily - 5 x weekly - 2 sets - 10 reps - 5 sec hold - Shoulder extension with resistance - Neutral  - 1 x daily - 5 x weekly - 2 sets - 10 reps - 5 sec hold - Seated Bent Over Shoulder Row with Dumbbells  - 1 x daily - 5 x  weekly - 2 sets - 10 reps - 3 sec hold - Seated Shoulder Extension  - 1 x daily - 5 x weekly - 2 sets - 10 reps - 3 sec hold - Supine Shoulder Circles  - 1 x daily - 5 x weekly - 2 sets - 10 reps - 3 sec hold   ASSESSMENT:  CLINICAL IMPRESSION: Ahsan requested HEP instructions for pulleys and modified prone rows/extension, therefore reviewed these exercises to ensure proper form/technique before adding to HEP. Progressed rhythmic stabilization with supine shoulder circles at 90 flexion with cues necessary to keep circles smaller.  Progressed L shoulder isometrics to YTB reactive isometric walkouts but intentionally deferred these from HEP due to need for close supervision to maintain proper shoulder alignment.  Fernando will benefit from continued skilled PT to address ongoing ROM and strength deficits to improve mobility and activity tolerance with decreased pain interference.   EVAL: [name] is  a 78 y.o. male who was referred to physical therapy for evaluation and treatment s/p L reverse TSA on 08/07/2023.  Patient reports nerve block wore off on POD #2 but pain has been mild (</= 3/10) since. Pain is worse with with perturbation to L UE.  Patient has deficits in L shoulder ROM and flexibility, B shoulder and postural strength, and abnormal posture which are interfering with ADLs and are impacting quality of life.  On QuickDASH patient scored 68.2/100 demonstrating 68.2% disability.  Potential for possible skin breakdown observed in L cubital fossa from moisture trapped while being in sling, therefore pt encouraged to spend a little more time out of the sling while seated to allow for air circulation to skin.  Keegen will benefit from skilled PT to address above deficits to improve mobility and activity tolerance with decreased pain interference.  OBJECTIVE IMPAIRMENTS: decreased activity tolerance, decreased knowledge of condition, decreased mobility, decreased ROM, decreased strength, increased fascial restrictions, impaired perceived functional ability, increased muscle spasms, impaired flexibility, improper body mechanics, postural dysfunction, and pain.   ACTIVITY LIMITATIONS: carrying, lifting, sleeping, bed mobility, bathing, toileting, dressing, self feeding, reach over head, and caring for others  PARTICIPATION LIMITATIONS: meal prep, cleaning, laundry, driving, and community activity  PERSONAL FACTORS: Age, Fitness, Past/current experiences, Time since onset of injury/illness/exacerbation, and 3+ comorbidities: Aneurysm of ascending aorta, aortic stenosis, B sensorineural hearing loss - extremely HOH, CAD s/p CABG, cardiomyopathy, AVR, chronic anticoagulation, DM-II with neuropathy, CVA of MCA, HTN, expressive aphasia, gout, lumbar stenosis s/p lumbar laminectomy and decompression 2023, shoulder OA, CKD, ventral hernia, hernia repair, hip surgery, hx multiple strokes (3-4), prostate cancer   are also affecting patient's functional outcome.   REHAB POTENTIAL: Good  CLINICAL DECISION MAKING: Evolving/moderate complexity  EVALUATION COMPLEXITY: Moderate   GOALS: Goals reviewed with patient? Yes  SHORT TERM GOALS: Target date: 09/26/2023  Patient will be independent with initial HEP to improve outcomes and carryover.  Baseline: Initial HEP provided on eval 08/30/23 - reviewed HEP and reinforced only P/AAROM at this phase of rehab protocol 09/14/23 - reviewed HEP and reinforced only P/AAROM at this phase of rehab protocol Goal status: MET - 09/20/23  2.  Patient will report 25% improvement in L shoulder pain to improve QOL.   Baseline: up to 3/10 Goal status: MET - 09/14/23 - 85% improved   3.  Patient will improve L shoulder flexion and scaption to >/=90 to improve functional use of L UE. Baseline: Refer to above UE ROM table Goal status: MET - 09/06/23 see ROM table  LONG TERM GOALS: Target date: 11/07/2023  Patient will be independent with ongoing/advanced HEP for self-management at home.  Baseline:  09/25/23 - HEP updated today Goal status: IN PROGRESS - 10/05/23 - HEP updated today  2.  Patient will report 50-75% improvement in L shoulder pain to improve QOL.  Baseline: up to 3/10 Goal status: MET - 09/14/23 - 85% improved   3.  Patient to demonstrate improved upright posture with posterior shoulder girdle engaged to promote improved glenohumeral joint mobility. Baseline:  Goal status: INITIAL  4.  Patient to improve L shoulder AROM to [facility] without pain provocation to allow for increased ease of ADLs.  Baseline: Refer to above UE ROM table Goal status: IN PROGRESS - 10/03/23 - L shoulder flexion and scaption/abduction nearing expected outcomes status post reverse TSA  5.  Patient will demonstrate improved L shoulder strength to >/= 4/5 for functional UE use. Baseline: Refer to above UE MMT table Goal status: INITIAL  6  Patient will report </= 50% on Quick[employer]  to  demonstrate improved functional ability.  Baseline: 68.2 / 100 = 68.2 % Goal status: INITIAL    PLAN:  PT FREQUENCY: 1-2x/week - 1x/wk for rehab phase I (1-4 wks), 1-2x/wk thereafter  PT DURATION: 12 weeks  PLANNED INTERVENTIONS: 02835- PT Re-evaluation, 97750- Physical Performance Testing, 97110-Therapeutic exercises, 97530- Therapeutic activity, [id number]- Neuromuscular re-education, 97535- Self Care, 02859- Manual therapy, [id number]- Electrical stimulation (unattended), 97016- Vasopneumatic device, 97035- Ultrasound, 02966- Ionotophoresis 4mg /ml Dexamethasone , Patient/Family education, Taping, Joint mobilization, Cryotherapy, and Moist heat  PLAN FOR NEXT SESSION: Gradually progress into L reverse TSA protocol phase III (6-10 wks) - post-op week #9 as of 10/09/23 (surgery 08/07/23)  PRECAUTIONS: Shoulder -  Avoid resisted IR/backward extension until 3 months post-op to protect subscapularis repair Progress patient in scapular plane from PROM to AAROM to AROM ensuring proper kinematics throughout care to avoid over stress to deltoid/deltoid attachments    Elijah CHRISTELLA Hidden, PT 10/05/2023, 11:18 AM

## 2023-10-07 IMAGING — CT CT ANGIO HEAD
1 of 8 series · 6 of 33 positions shown · IV contrast (Omnipaque)
Comparison: No prior CTA, correlation is made with CT head
06/24/2021

CLINICAL DATA: Aphasia, facial droop, stroke suspected

EXAM:
CT ANGIOGRAPHY HEAD AND NECK
TECHNIQUE: Multidetector CT imaging of the head and neck was performed using
the standard protocol during bolus administration of intravenous
contrast. Multiplanar CT image reconstructions and MIPs were
obtained to evaluate the vascular anatomy. Carotid stenosis
measurements (when applicable) are obtained utilizing NASCET
criteria, using the distal internal carotid diameter as the
denominator.

[Series 7: axial thin · axial · 0.37mm/px · z∈[-314,-51]mm · 6 of 369 slices shown]
[im 53/369  soft-tissue]
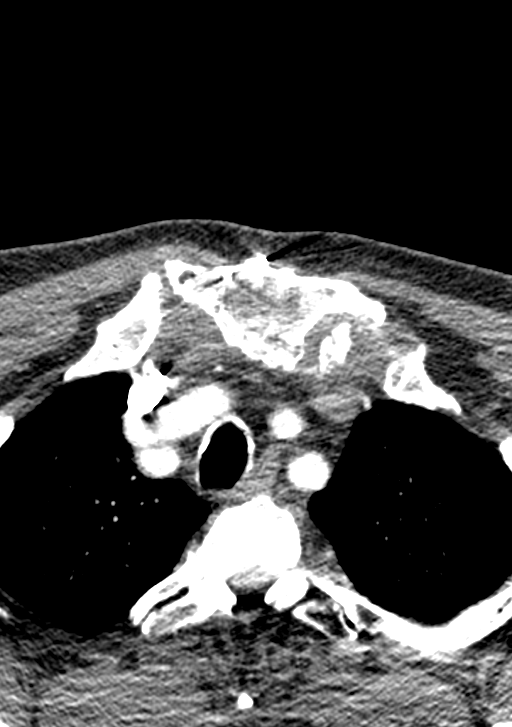
[im 106/369  bone]
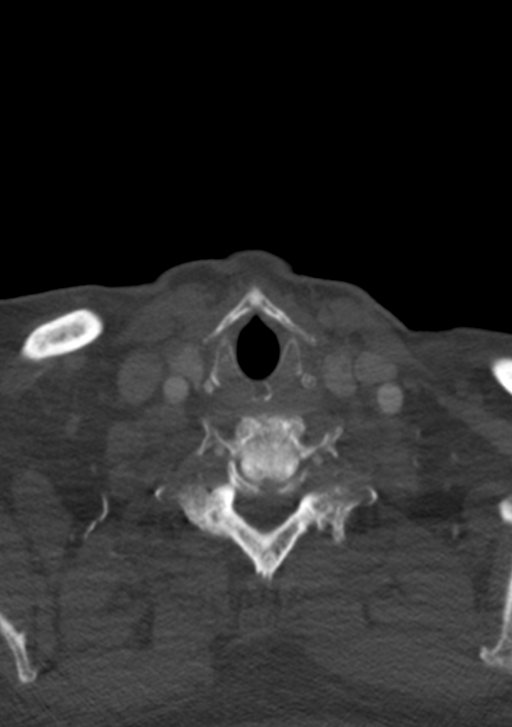
[im 158/369  soft-tissue]
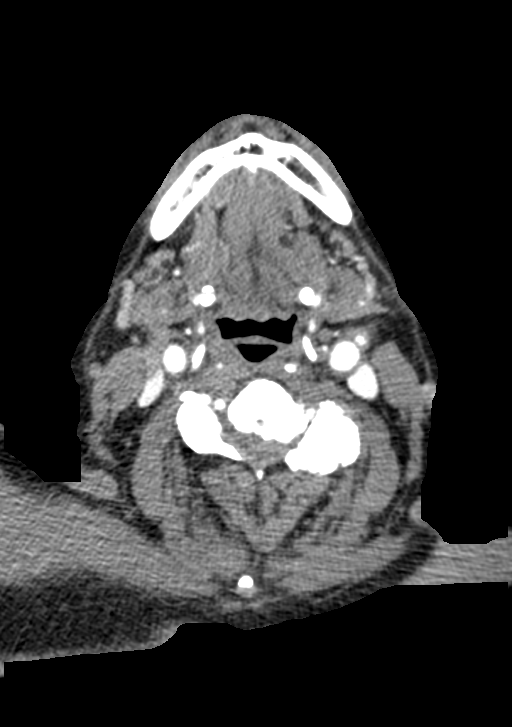
[im 211/369  bone]
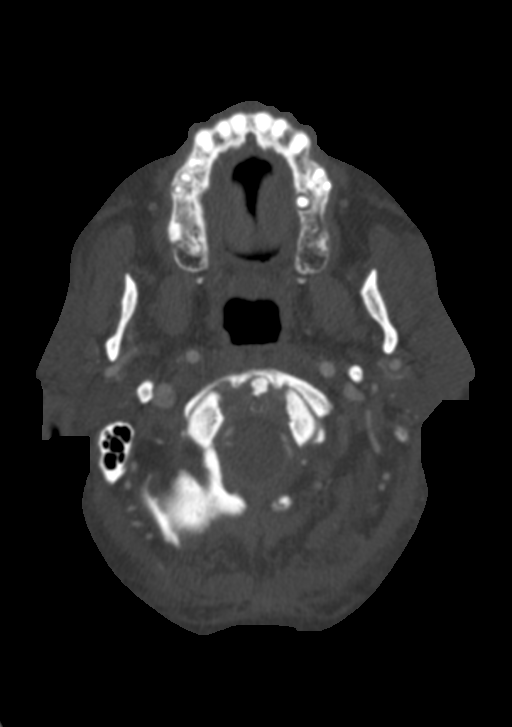
[im 263/369  soft-tissue]
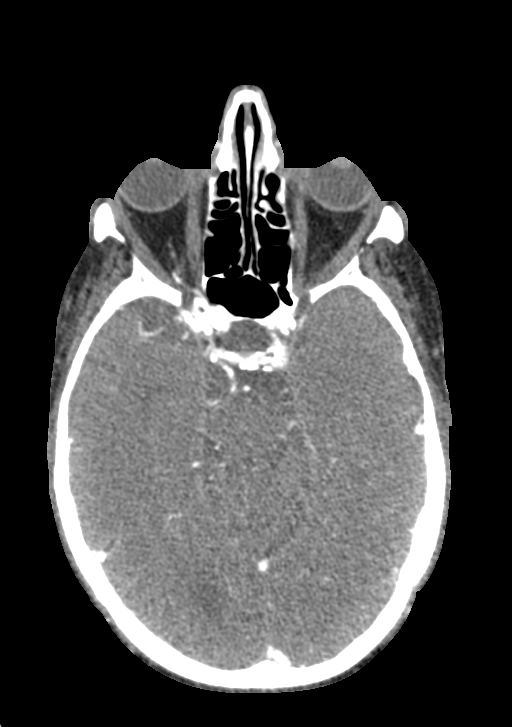
[im 316/369  bone]
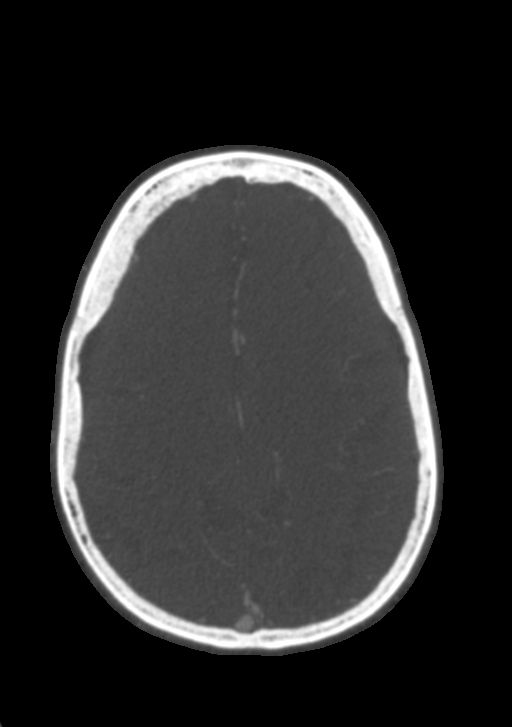

[6 of 33 positions shown; findings below may reference images not displayed]

RADIATION DOSE REDUCTION: This exam was performed according to the
departmental dose-optimization program which includes automated
exposure control, adjustment of the mA and/or kV according to
patient size and/or use of iterative reconstruction technique.

CONTRAST:  100mL OMNIPAQUE IOHEXOL 350 MG/ML SOLN
FINDINGS: CT HEAD FINDINGS

For noncontrast findings, please see same day CT head.

CTA NECK FINDINGS

Aortic arch: Standard branching. Imaged portion shows no evidence of
aneurysm or dissection. No significant stenosis of the major arch
vessel origins. Aortic atherosclerosis.

Right carotid system: No evidence of dissection, occlusion, or
hemodynamically significant stenosis (greater than 50%).
Calcifications at the bifurcation, in the proximal right ICA, and in
the distal right ICA are not hemodynamically significant.

Left carotid system: No evidence of dissection, occlusion, or
hemodynamically significant stenosis (greater than 50%).
Calcifications at the bifurcation and in the left ICA are not
hemodynamically significant.

Vertebral arteries: No evidence of dissection, occlusion, or
hemodynamically significant stenosis (greater than 50%).

Skeleton: Diffusely sclerosis. Status post median sternotomy.
Degenerative changes in the cervical spine.

Other neck: No acute finding.

Upper chest: No focal pulmonary opacity or pleural effusion.

Review of the MIP images confirms the above findings

CTA HEAD FINDINGS

Anterior circulation: Both internal carotid arteries are patent to
the termini, with moderate to severe narrowing in the right
cavernous carotid, moderate narrowing in the right supraclinoid
carotid, and moderate narrowing in the left cavernous carotid.

Left A1 segment is patent. The right A1 is aplastic. Normal anterior
communicating artery. Anterior cerebral arteries are patent to their
distal aspects.

No M1 stenosis or occlusion, although the right M1 is somewhat
irregular. Normal MCA bifurcations. Distal MCA branches perfused and
symmetric.

Posterior circulation: Vertebral arteries patent to the
vertebrobasilar junction, with mild narrowing in the proximal left
V4 and moderate stenosis in the distal left V4. Posterior inferior
cerebellar arteries patent proximally.

Basilar patent to its distal aspect but diminutive. Superior
cerebellar arteries patent proximally.

Possible hypoplastic P1 segments, with fetal or near fetal origin of
the bilateral PCAs. PCAs perfused to their

Venous sinuses: As permitted by contrast timing, patent.

Anatomic variants: Fetal or near fetal origin of the bilateral PCAs.

Review of the MIP images confirms the above findings
IMPRESSION: 1. No intracranial large vessel occlusion. Moderate to severe
narrowing of the right cavernous carotid and moderate narrowing in
the right supraclinoid carotid, left cavernous carotid, and distal
left V4.
2.  No hemodynamically significant stenosis in the neck.
3. Diffuse osseous sclerosis, which is nonspecific but can be seen
in the setting of renal osteodystrophy, Paget's disease, and diffuse
metastatic disease, among other conditions.

## 2023-10-08 ENCOUNTER — Ambulatory Visit

## 2023-10-08 DIAGNOSIS — R2681 Unsteadiness on feet: Secondary | ICD-10-CM | POA: Diagnosis not present

## 2023-10-08 DIAGNOSIS — Z7409 Other reduced mobility: Secondary | ICD-10-CM | POA: Diagnosis not present

## 2023-10-08 DIAGNOSIS — M25612 Stiffness of left shoulder, not elsewhere classified: Secondary | ICD-10-CM | POA: Diagnosis not present

## 2023-10-08 DIAGNOSIS — M6281 Muscle weakness (generalized): Secondary | ICD-10-CM

## 2023-10-08 DIAGNOSIS — R293 Abnormal posture: Secondary | ICD-10-CM | POA: Diagnosis not present

## 2023-10-08 DIAGNOSIS — M25512 Pain in left shoulder: Secondary | ICD-10-CM | POA: Diagnosis not present

## 2023-10-08 NOTE — Therapy (Signed)
 OUTPATIENT PHYSICAL THERAPY TREATMENT    Patient Name: [name] MRN: [id number] DOB:02-Apr-1945, 78 y.o., male Today's Date: 10/08/2023   END OF SESSION:  PT End of Session - 10/08/23 0937     Visit Number 10    Date for PT Re-Evaluation 11/07/23    Authorization Type [employer]    Progress Note Due on Visit 15   MD PN for 09/19/23 appt on visit #5 (09/14/23)   PT Start Time 0930    PT Stop Time 1011    PT Time Calculation (min) 41 min    Activity Tolerance Patient tolerated treatment well    Behavior During Therapy [facility] for tasks assessed/performed               Past Medical History:  Diagnosis Date   Actinic keratosis 02/14/2011   Allergy    Aneurysm of ascending aorta without rupture (HCC) 01/06/2022   Aortic stenosis    Arthritis    Bilateral sensorineural hearing loss 06/16/2009   CAD (coronary artery disease)    cabg   Complication of anesthesia    Coronary atherosclerosis of native coronary artery 06/15/2009   Diabetic polyneuropathy 11/11/2018   Dyslipidemia 11/05/2018   Embolic stroke involving left middle cerebral artery  05/30/2021   Erectile dysfunction 05/18/2009   Essential hypertension 06/16/2009   Expressive aphasia 06/25/2021   External hemorrhoids 03/21/2010   Gout 04/09/2015   Heart murmur    Hemochromatosis 02/14/2011   History of aortic valve replacement 10/13/2009   History of CABG 11/05/2018   2005- 1. Coronary artery bypass grafting x4 with LIMA-LAD, SVG-OM1, SVG-AM and dCFX.  2. Aortic valve replacement with [employer] AVR 3. Septal myomectomy.  4. Myoview low risk 2016   History of hepatitis 01/31/1972   Hypertriglyceridemia 06/13/2010   Hypertrophic obstructive cardiomyopathy 05/06/2008   SP septal myomectomy at surgery 2005 Echo Nov 2018- Hyperdynamic LVEF with severe basal septal hypertrophy. There is chordal SAM with resting gradient of 16 mmHg that increases to 85 mmHg with Valsalva        Leg weakness, bilateral 01/18/2021    Long term (current) use of anticoagulants 05/22/2018   Lumbar stenosis with neurogenic claudication 02/04/2021   Malignant neoplasm of prostate 06/17/2009   Mechanical heart valve present    Manufacturer: Deitra Rosemary Blade #: [id number]  Model #: [id number]. Card states MRI compatible with 3 teslas or less.   Obesity, unspecified 04/29/2008   OSA (obstructive sleep apnea) 07/12/2012   moderate-severe; uses CPAP nightly   PONV (postoperative nausea and vomiting)    after CABG- slow to wake up   Presence of prosthetic heart valve    Primary osteoarthritis of left shoulder 10/31/2018   S/P lumbar laminectomy 02/04/2021   Stage 3 chronic kidney disease due to type 2 diabetes mellitus 11/11/2018   Tobacco use 06/16/2009   Type 2 diabetes mellitus with hyperglycemia    Ventral hernia 03/03/2014   Past Surgical History:  Procedure Laterality Date   AORTIC VALVE REPLACEMENT  11/14/2003   [facility] [employer] [facility]   APPENDECTOMY  1990   CHOLECYSTECTOMY  1990   CORONARY ANGIOGRAPHY  10/11/2021   CORONARY ARTERY BYPASS GRAFT  10/2003   HERNIA REPAIR  1999   right, inguinal   HERNIA REPAIR  2002   left, inguinal   HIP SURGERY  2006   right hip   IR CT HEAD LTD  02/03/2022   IR PERCUTANEOUS ART THROMBECTOMY/INFUSION INTRACRANIAL INC DIAG ANGIO  01/24/2022  LUMBAR LAMINECTOMY/DECOMPRESSION MICRODISCECTOMY Bilateral 02/04/2021   Procedure: Bilateral Lumbar Two-Three Laminectomy;  Surgeon: Colon Shove, MD;  Location: [facility] OR;  Service: Neurosurgery;  Laterality: Bilateral;  3C/RM 20   PILONIDAL CYST EXCISION  1964   prostate seed implant  03/2010   RADIOLOGY WITH ANESTHESIA N/A 01/24/2022   Procedure: IR WITH ANESTHESIA;  Surgeon: Radiologist, Medication, MD;  Location: [facility] OR;  Service: Radiology;  Laterality: N/A;   REVERSE SHOULDER ARTHROPLASTY Left 08/07/2023   Procedure: ARTHROPLASTY, SHOULDER, TOTAL, REVERSE;  Surgeon: Josefina Chew, MD;  Location: [facility] ORS;  Service: Orthopedics;  Laterality: Left;    TEE WITHOUT CARDIOVERSION N/A 03/14/2022   Procedure: TRANSESOPHAGEAL ECHOCARDIOGRAM (TEE);  Surgeon: Loni Soyla LABOR, MD;  Location: [facility] ENDOSCOPY;  Service: Cardiology;  Laterality: N/A;   TONSILLECTOMY  childhood   Patient Active Problem List   Diagnosis Date Noted   S/P reverse total shoulder arthroplasty, left 08/07/2023   Pre-op evaluation 01/17/2023   Left shoulder pain 11/17/2022   Hypertrophic cardiomyopathy (HCC) 08/12/2022   Chronic anticoagulation 08/12/2022   Hypotension 04/27/2022   H/O: CVA (cerebrovascular accident) 03/14/2022   Urinary retention 01/26/2022   Acute ischemic stroke (HCC) 01/24/2022   Middle cerebral artery embolism, left 01/24/2022   Aneurysm of ascending aorta without rupture (HCC) 01/06/2022   Type 2 diabetes mellitus with stage 3a chronic kidney disease, with long-term current use of insulin  (HCC) 10/30/2021   Type 2 diabetes mellitus with diabetic polyneuropathy, with long-term current use of insulin  (HCC) 10/30/2021   NSTEMI (non-ST elevated myocardial infarction) (HCC) 10/09/2021   Paroxysmal atrial fibrillation (HCC) 10/09/2021   Presence of prosthetic heart valve    Type 2 diabetes mellitus with hyperglycemia    Expressive aphasia 06/25/2021   History of CVA (cerebrovascular accident) 05/2021   S/P lumbar laminectomy 02/04/2021   Lumbar stenosis with neurogenic claudication 02/04/2021   History of falling 01/30/2021   Diabetic polyneuropathy 11/11/2018   Stage 3 chronic kidney disease due to type 2 diabetes mellitus 11/11/2018   History of CABG 11/05/2018   Dyslipidemia 11/05/2018   Primary osteoarthritis, left shoulder 10/31/2018   Long term (current) use of anticoagulants 05/22/2018   Ventral hernia 03/03/2014   OSA (obstructive sleep apnea) 07/12/2012   Hemochromatosis 02/14/2011   Actinic keratosis 02/14/2011   Hypertriglyceridemia 06/13/2010   External hemorrhoids 03/21/2010   History of aortic valve replacement 10/13/2009    Malignant neoplasm of prostate (HCC) 06/17/2009   Bilateral sensorineural hearing loss 06/16/2009   Essential hypertension 06/16/2009   Coronary atherosclerosis of native coronary artery 06/15/2009   Erectile dysfunction 05/18/2009   Obesity, unspecified 04/29/2008    PCP: Daryl Setter, NP   REFERRING PROVIDER: Josefina Chew, MD   REFERRING DIAG: [id number] (ICD-10-CM) - Status post reverse total shoulder replacement, left  THERAPY DIAG:  Stiffness of left shoulder, not elsewhere classified  Acute pain of left shoulder  Muscle weakness (generalized)  Abnormal posture  RATIONALE FOR EVALUATION AND TREATMENT: Rehabilitation  ONSET DATE: 08/07/2023 - L reverse TSA  NEXT MD VISIT: 09/19/23   SUBJECTIVE:  SUBJECTIVE STATEMENT:  No aches or pains today.  PAIN: Are you having pain? Yes: NPRS scale: 0/10 currently, intermittent up to 1/10 Pain location: L shoulder  Pain description: ache  Aggravating factors: perturbation to arm  Relieving factors: ice, Tylenol  PRN   PERTINENT HISTORY:  Aneurysm of ascending aorta, aortic stenosis, B sensorineural hearing loss - extremely HOH, CAD s/p CABG, cardiomyopathy, AVR, chronic anticoagulation, DM-II with neuropathy, CVA of MCA, HTN, expressive aphasia, gout, lumbar stenosis s/p lumbar laminectomy and decompression 2023, shoulder OA, CKD, ventral hernia, hernia repair, hip surgery, hx multiple strokes (3-4), prostate cancer   PRECAUTIONS: Shoulder -  Avoid resisted IR/backward extension until 3 months post-op to protect subscapularis repair Progress patient in scapular plane from PROM to AAROM to AROM ensuring proper kinematics throughout care to avoid over stress to deltoid/deltoid attachments   RED FLAGS: None  HAND DOMINANCE:  Right  WEIGHT BEARING RESTRICTIONS: Yes Phase I (1-4 wks): <1 lb; Phase II-IV (4-12 wks): <5 lbs; Phase V (12-16 wks): advance to tolerance; Phase VI (16 wks to 6 mo): Full  FALLS:  Has patient fallen in last 6 months? Yes. Number of falls 1 - fell in garden in May  LIVING ENVIRONMENT:  Lives with: lives with their family Lives in: House/apartment Stairs: No Has following equipment at home: Environmental consultant - 2 wheeled, Crutches, and Grab bars  OCCUPATION: Retired  PLOF: Independent with household mobility without device, Independent with community mobility with device, Needs assistance with ADLs, and Leisure: gardening, 2-3 days/wk at [employer]  PATIENT GOALS: To use normal ROM (for my arm) as much as I can. To not be in pain all the time.   OBJECTIVE: (objective measures completed at initial evaluation unless otherwise dated)  DIAGNOSTIC FINDINGS:  08/07/23 - DG Left Shoulder: CLINICAL DATA:  Status post reverse shoulder arthroplasty.   FINDINGS: Total left shoulder arthroplasty. The arthroplasty components appear intact and in anatomic alignment. There is no acute fracture ordislocation. Postsurgical changes in the soft tissues of the leftshoulder.   IMPRESSION: Total left shoulder arthroplasty.  PATIENT SURVEYS:  Quick Dash: 68.2 / 100 = 68.2 % Minimally Clinically Important Difference (MCID): 15-20 points  COGNITION: Overall cognitive status: Within functional limits for tasks assessed     SENSATION: WFL  POSTURE: rounded shoulders and forward head  UPPER EXTREMITY ROM:   Passive ROM Left eval L 08/30/23 L 09/06/23 L 09/14/23 L 10/03/23  Shoulder flexion 58 90 110 119 120  Shoulder extension       Shoulder abduction 50 scaption 90 scaption 105 117 scaption 128  Shoulder adduction       Shoulder internal rotation       Shoulder external rotation -6 from neutral  24 at ~30 scaption 21 31 at ~30 scaption 30   Active ROM Right  Left   Shoulder flexion    Shoulder  extension    Shoulder abduction    Shoulder adduction    Shoulder internal rotation    Shoulder external rotation    Elbow flexion    Elbow extension    Wrist flexion    Wrist extension    Wrist ulnar deviation    Wrist radial deviation    Wrist pronation    Wrist supination    (Blank rows = not tested)  UPPER EXTREMITY MMT: (deferred on eval d/t only 1 week postop)  MMT Right  Left   Shoulder flexion    Shoulder extension    Shoulder abduction    Shoulder adduction  Shoulder internal rotation    Shoulder external rotation    Middle trapezius    Lower trapezius    Elbow flexion    Elbow extension    Wrist flexion    Wrist extension    Wrist ulnar deviation    Wrist radial deviation    Wrist pronation    Wrist supination    Grip strength (lbs)    (Blank rows = not tested)  JOINT MOBILITY TESTING:  Patient very guarded with initial PROM  PALPATION/OBSERVATION: Bruising draining into upper arm.  Skin at cubital fossa pale and slightly moist/macerated.    TODAY'S TREATMENT:  10/08/2023 THERAPEUTIC ACTIVITIES: To improve functional performance with overhead reach.  Demonstration, verbal and tactile cues throughout for technique. Pulleys - flexion and scaption x 3' each - cues to remain in pain free ROM Inclined L shoulder flexion 2 x 10- cues to avoid painful ROM Inclined L shoulder scaption 2 x 10 - cues to avoid painful ROM  NEUROMUSCULAR RE-EDUCATION: To improve coordination, kinesthesia, posture, proprioception, and amplitude of movement. Seated bent over prone scapular retraction with (cues to avoid shoulder shrug and avoid extension ROM beyond neutral): Row to neutral 2 x 10 Shoulder extension to neutral 2 x 10 L shoulder rhythmic stabilization at 90 flexion x 30 sec with 2-finger perturbation at elbow, 2 x 30 sec with 2-finger perturbation at distal forearm  Supine L shoulder CW/CCW small circles at 90 flexion 2 x 10 each direction (cues to keep circular  motion small amplitude) L shoulder YTB reactive isometrics step-outs - neutral shoulder with elbow at 90: Flexion x 10 IR x 10 ER x 5 10/05/2023 THERAPEUTIC ACTIVITIES: To improve functional performance with overhead reach.  Demonstration, verbal and tactile cues throughout for technique. Pulleys - flexion and scaption x 3' each - cues to remain in pain free ROM  NEUROMUSCULAR RE-EDUCATION: To improve coordination, kinesthesia, posture, proprioception, and amplitude of movement. Seated bent over prone scapular retraction with (cues to avoid shoulder shrug and avoid extension ROM beyond neutral): Row to neutral 2 x 10 Shoulder extension to neutral 2 x 10 L shoulder rhythmic stabilization at 90 flexion x 30 sec with 2-finger perturbation at elbow, 2 x 30 sec with 2-finger perturbation at distal forearm Supine L shoulder CW/CCW small circles at 90 flexion 2 x 10 each direction (cues to keep circular motion small amplitude) L shoulder YTB reactive isometrics step-outs - neutral shoulder with elbow at 90: Flexion x 10 IR x 10 ER x 5   10/03/2023  THERAPEUTIC ACTIVITY: to improve ability to reach overhead Pulleys - flexion and scaption x 3' each - cues to remain in pain free ROM Supine inclined shoulder AAROM flexion with wand x 10 Supine inclined shoulder AROM flexion 2 x 10 Supine inclined shoulder AROM scaption with wand 2 x 10  NEUROMUSCULAR RE-EDUCATION: To improve coordination, kinesthesia, posture, and proprioception. Standing YTB row 3 x 10 (VC & TC to avoid shoulder shrug) Standing YTB shoulder extension to neutral 3 x 10 Prone scapular retraction with (cues to avoid shoulder shrug): Row to neutral 3 x 10 Shoulder extension to neutral 3 x 10  09/25/2023  THERAPEUTIC EXERCISE: To improve strength, endurance, ROM, and flexibility.  Demonstration, verbal and tactile cues throughout for technique. Pulleys - flexion and scaption x 3' each - cues to remain in pain free ROM Supine L  shoulder AROM flexion with elbow flexed (uppercut) 2 x 10 Supine L shoulder AROM scaption with elbow flexed (hitchhiker) 2 x 10  NEUROMUSCULAR RE-EDUCATION: To improve coordination, kinesthesia, posture, and proprioception. Standing YTB scap retraction + B shoulder row 2 x 10 (VC & TC to avoid shoulder shrug) Standing YTB scap retraction + B shoulder extension to neutral 2 x 10 Prone scapular retraction with (cues to avoid shoulder shrug): Row to neutral 2 x 10 Shoulder extension to neutral 2 x 10 L shoulder rhythmic stabilization at 90 flexion x 30 sec with 2-finger perturbation at elbow, 2 x 30 sec with 2-finger perturbation at distal forearm      PATIENT EDUCATION:  Education details: HEP review, HEP update, postural awareness, and reminders for surgical protocol precautions  Person educated: Patient Education method: Explanation, Demonstration, Verbal cues, and MedBridgeGO app updated Education comprehension: verbalized understanding, returned demonstration, verbal cues required, and needs further education  HOME EXERCISE PROGRAM: *Pt using MedBridgeGO app  Access Code: [email or web address] URL: [email or web address] Date: 10/08/2023 Prepared by: Sol Gaskins  Exercises - Seated Shoulder Flexion AAROM with Pulley Behind  - 1 x daily - 7 x weekly - 3 min hold - Seated Shoulder Scaption AAROM with Pulley at Side  - 1 x daily - 7 x weekly - 3 min hold - Seated Shoulder External Rotation AAROM with Cane and Hand in Neutral (Mirrored)  - 2-3 x daily - 7 x weekly - 2 sets - 10 reps - 3 sec hold - Seated Isometric Shoulder Flexion  - 2 x daily - 7 x weekly - 2 sets - 5 reps - 5 sec hold - Isometric Shoulder Abduction  - 2 x daily - 7 x weekly - 2 sets - 5 reps - 5 sec hold - Seated Isometric Shoulder External Rotation  - 2 x daily - 7 x weekly - 2 sets - 5 reps - 5 sec hold - Standing Shoulder Row with Anchored Resistance  - 1 x daily - 5 x weekly - 2 sets - 10 reps - 5 sec  hold - Shoulder extension with resistance - Neutral  - 1 x daily - 5 x weekly - 2 sets - 10 reps - 5 sec hold - Shoulder Internal Rotation Reactive Isometrics  - 1 x daily - 7 x weekly - 1 sets - 10 reps - Shoulder external rotation  - 1 x daily - 7 x weekly - 1 sets - 10 reps - Standing Shoulder Flexion Reactive Isometric  - 1 x daily - 7 x weekly - 1 sets - 10 reps - Seated Bent Over Shoulder Row with Dumbbells  - 1 x daily - 5 x weekly - 2 sets - 10 reps - 3 sec hold - Seated Shoulder Extension  - 1 x daily - 5 x weekly - 2 sets - 10 reps - 3 sec hold - Supine Shoulder Circles  - 1 x daily - 5 x weekly - 2 sets - 10 reps - 3 sec hold - Supine Shoulder Flexion AAROM with Dowel  - 1 x daily - 7 x weekly - 2 sets - 10 reps - 3 sec hold   ASSESSMENT:  CLINICAL IMPRESSION: Continued to progress with AROM and strengthening for L shoulder per protocol. Pt showed good demonstration of exercises today. Able to complete all interventions w/o any complications. Cues provided throughout session as needed. Added isometric step outs to HEP. Rashidi will benefit from continued skilled PT to address ongoing ROM and strength deficits to improve mobility and activity tolerance with decreased pain interference.   EVAL: OSSIE BELTRAN is a 78 y.o. male who was  referred to physical therapy for evaluation and treatment s/p L reverse TSA on 08/07/2023.  Patient reports nerve block wore off on POD #2 but pain has been mild (</= 3/10) since. Pain is worse with with perturbation to L UE.  Patient has deficits in L shoulder ROM and flexibility, B shoulder and postural strength, and abnormal posture which are interfering with ADLs and are impacting quality of life.  On QuickDASH patient scored 68.2/100 demonstrating 68.2% disability.  Potential for possible skin breakdown observed in L cubital fossa from moisture trapped while being in sling, therefore pt encouraged to spend a little more time out of the sling while seated  to allow for air circulation to skin.  Conny will benefit from skilled PT to address above deficits to improve mobility and activity tolerance with decreased pain interference.  OBJECTIVE IMPAIRMENTS: decreased activity tolerance, decreased knowledge of condition, decreased mobility, decreased ROM, decreased strength, increased fascial restrictions, impaired perceived functional ability, increased muscle spasms, impaired flexibility, improper body mechanics, postural dysfunction, and pain.   ACTIVITY LIMITATIONS: carrying, lifting, sleeping, bed mobility, bathing, toileting, dressing, self feeding, reach over head, and caring for others  PARTICIPATION LIMITATIONS: meal prep, cleaning, laundry, driving, and community activity  PERSONAL FACTORS: Age, Fitness, Past/current experiences, Time since onset of injury/illness/exacerbation, and 3+ comorbidities: Aneurysm of ascending aorta, aortic stenosis, B sensorineural hearing loss - extremely HOH, CAD s/p CABG, cardiomyopathy, AVR, chronic anticoagulation, DM-II with neuropathy, CVA of MCA, HTN, expressive aphasia, gout, lumbar stenosis s/p lumbar laminectomy and decompression 2023, shoulder OA, CKD, ventral hernia, hernia repair, hip surgery, hx multiple strokes (3-4), prostate cancer  are also affecting patient's functional outcome.   REHAB POTENTIAL: Good  CLINICAL DECISION MAKING: Evolving/moderate complexity  EVALUATION COMPLEXITY: Moderate   GOALS: Goals reviewed with patient? Yes  SHORT TERM GOALS: Target date: 09/26/2023  Patient will be independent with initial HEP to improve outcomes and carryover.  Baseline: Initial HEP provided on eval 08/30/23 - reviewed HEP and reinforced only P/AAROM at this phase of rehab protocol 09/14/23 - reviewed HEP and reinforced only P/AAROM at this phase of rehab protocol Goal status: MET - 09/20/23  2.  Patient will report 25% improvement in L shoulder pain to improve QOL.   Baseline: up to 3/10 Goal  status: MET - 09/14/23 - 85% improved   3.  Patient will improve L shoulder flexion and scaption to >/=90 to improve functional use of L UE. Baseline: Refer to above UE ROM table Goal status: MET - 09/06/23 see ROM table  LONG TERM GOALS: Target date: 11/07/2023  Patient will be independent with ongoing/advanced HEP for self-management at home.  Baseline:  09/25/23 - HEP updated today Goal status: IN PROGRESS - 10/05/23 - HEP updated today  2.  Patient will report 50-75% improvement in L shoulder pain to improve QOL.  Baseline: up to 3/10 Goal status: MET - 09/14/23 - 85% improved   3.  Patient to demonstrate improved upright posture with posterior shoulder girdle engaged to promote improved glenohumeral joint mobility. Baseline:  Goal status: INITIAL  4.  Patient to improve L shoulder AROM to [facility] without pain provocation to allow for increased ease of ADLs.  Baseline: Refer to above UE ROM table Goal status: IN PROGRESS - 10/03/23 - L shoulder flexion and scaption/abduction nearing expected outcomes status post reverse TSA  5.  Patient will demonstrate improved L shoulder strength to >/= 4/5 for functional UE use. Baseline: Refer to above UE MMT table Goal status: INITIAL  6  Patient will report </= 50% on QuickDASH  to demonstrate improved functional ability.  Baseline: 68.2 / 100 = 68.2 % Goal status: INITIAL    PLAN:  PT FREQUENCY: 1-2x/week - 1x/wk for rehab phase I (1-4 wks), 1-2x/wk thereafter  PT DURATION: 12 weeks  PLANNED INTERVENTIONS: 02835- PT Re-evaluation, 97750- Physical Performance Testing, 97110-Therapeutic exercises, 97530- Therapeutic activity, [id number]- Neuromuscular re-education, 97535- Self Care, 02859- Manual therapy, [id number]- Electrical stimulation (unattended), [id number]- Vasopneumatic device, 97035- Ultrasound, 02966- Ionotophoresis 4mg /ml Dexamethasone , Patient/Family education, Taping, Joint mobilization, Cryotherapy, and Moist heat  PLAN FOR NEXT SESSION: review  isometric step outs: Gradually progress into L reverse TSA protocol phase III (6-10 wks) - post-op week #9 as of 10/09/23 (surgery 08/07/23)  PRECAUTIONS: Shoulder -  Avoid resisted IR/backward extension until 3 months post-op to protect subscapularis repair Progress patient in scapular plane from PROM to AAROM to AROM ensuring proper kinematics throughout care to avoid over stress to deltoid/deltoid attachments    Sol LITTIE Gaskins, PTA 10/08/2023, 10:14 AM

## 2023-10-10 ENCOUNTER — Other Ambulatory Visit: Payer: Self-pay | Admitting: Cardiology

## 2023-10-10 ENCOUNTER — Encounter

## 2023-10-11 ENCOUNTER — Encounter: Admitting: Physical Therapy

## 2023-10-12 ENCOUNTER — Encounter: Payer: Self-pay | Admitting: Physical Therapy

## 2023-10-12 ENCOUNTER — Ambulatory Visit: Admitting: Physical Therapy

## 2023-10-12 DIAGNOSIS — R2681 Unsteadiness on feet: Secondary | ICD-10-CM | POA: Diagnosis not present

## 2023-10-12 DIAGNOSIS — Z7409 Other reduced mobility: Secondary | ICD-10-CM | POA: Diagnosis not present

## 2023-10-12 DIAGNOSIS — R293 Abnormal posture: Secondary | ICD-10-CM

## 2023-10-12 DIAGNOSIS — M6281 Muscle weakness (generalized): Secondary | ICD-10-CM | POA: Diagnosis not present

## 2023-10-12 DIAGNOSIS — M25512 Pain in left shoulder: Secondary | ICD-10-CM

## 2023-10-12 DIAGNOSIS — M25612 Stiffness of left shoulder, not elsewhere classified: Secondary | ICD-10-CM | POA: Diagnosis not present

## 2023-10-12 NOTE — Therapy (Signed)
 OUTPATIENT PHYSICAL THERAPY TREATMENT    Patient Name: [name] MRN: [id number] DOB:Mar 14, 1945, 78 y.o., male Today's Date: 10/12/2023   END OF SESSION:  PT End of Session - 10/12/23 0844     Visit Number 11    Date for PT Re-Evaluation 11/07/23    Authorization Type [employer]    PT Start Time 0844    PT Stop Time 0929    PT Time Calculation (min) 45 min    Activity Tolerance Patient tolerated treatment well    Behavior During Therapy [facility] for tasks assessed/performed               Past Medical History:  Diagnosis Date   Actinic keratosis 02/14/2011   Allergy    Aneurysm of ascending aorta without rupture (HCC) 01/06/2022   Aortic stenosis    Arthritis    Bilateral sensorineural hearing loss 06/16/2009   CAD (coronary artery disease)    cabg   Complication of anesthesia    Coronary atherosclerosis of native coronary artery 06/15/2009   Diabetic polyneuropathy 11/11/2018   Dyslipidemia 11/05/2018   Embolic stroke involving left middle cerebral artery  05/30/2021   Erectile dysfunction 05/18/2009   Essential hypertension 06/16/2009   Expressive aphasia 06/25/2021   External hemorrhoids 03/21/2010   Gout 04/09/2015   Heart murmur    Hemochromatosis 02/14/2011   History of aortic valve replacement 10/13/2009   History of CABG 11/05/2018   2005- 1. Coronary artery bypass grafting x4 with LIMA-LAD, SVG-OM1, SVG-AM and dCFX.  2. Aortic valve replacement with [employer] AVR 3. Septal myomectomy.  4. Myoview low risk 2016   History of hepatitis 01/31/1972   Hypertriglyceridemia 06/13/2010   Hypertrophic obstructive cardiomyopathy 05/06/2008   SP septal myomectomy at surgery 2005 Echo Nov 2018- Hyperdynamic LVEF with severe basal septal hypertrophy. There is chordal SAM with resting gradient of 16 mmHg that increases to 85 mmHg with Valsalva        Leg weakness, bilateral 01/18/2021   Long term (current) use of anticoagulants 05/22/2018   Lumbar stenosis  with neurogenic claudication 02/04/2021   Malignant neoplasm of prostate 06/17/2009   Mechanical heart valve present    Manufacturer: Deitra Rosemary Blade #: [id number]  Model #: [id number]. Card states MRI compatible with 3 teslas or less.   Obesity, unspecified 04/29/2008   OSA (obstructive sleep apnea) 07/12/2012   moderate-severe; uses CPAP nightly   PONV (postoperative nausea and vomiting)    after CABG- slow to wake up   Presence of prosthetic heart valve    Primary osteoarthritis of left shoulder 10/31/2018   S/P lumbar laminectomy 02/04/2021   Stage 3 chronic kidney disease due to type 2 diabetes mellitus 11/11/2018   Tobacco use 06/16/2009   Type 2 diabetes mellitus with hyperglycemia    Ventral hernia 03/03/2014   Past Surgical History:  Procedure Laterality Date   AORTIC VALVE REPLACEMENT  11/14/2003   [facility]   APPENDECTOMY  1990   CHOLECYSTECTOMY  1990   CORONARY ANGIOGRAPHY  10/11/2021   CORONARY ARTERY BYPASS GRAFT  10/2003   HERNIA REPAIR  1999   right, inguinal   HERNIA REPAIR  2002   left, inguinal   HIP SURGERY  2006   right hip   IR CT HEAD LTD  02/03/2022   IR PERCUTANEOUS ART THROMBECTOMY/INFUSION INTRACRANIAL INC DIAG ANGIO  01/24/2022   LUMBAR LAMINECTOMY/DECOMPRESSION MICRODISCECTOMY Bilateral 02/04/2021   Procedure: Bilateral Lumbar Two-Three Laminectomy;  Surgeon: Colon Shove, MD;  Location:  [facility] OR;  Service: Neurosurgery;  Laterality: Bilateral;  3C/RM 20   PILONIDAL CYST EXCISION  1964   prostate seed implant  03/2010   RADIOLOGY WITH ANESTHESIA N/A 01/24/2022   Procedure: IR WITH ANESTHESIA;  Surgeon: Radiologist, Medication, MD;  Location: [facility] OR;  Service: Radiology;  Laterality: N/A;   REVERSE SHOULDER ARTHROPLASTY Left 08/07/2023   Procedure: ARTHROPLASTY, SHOULDER, TOTAL, REVERSE;  Surgeon: Josefina Chew, MD;  Location: [facility] ORS;  Service: Orthopedics;  Laterality: Left;   TEE WITHOUT CARDIOVERSION N/A 03/14/2022   Procedure: TRANSESOPHAGEAL  ECHOCARDIOGRAM (TEE);  Surgeon: Loni Soyla LABOR, MD;  Location: [facility] ENDOSCOPY;  Service: Cardiology;  Laterality: N/A;   TONSILLECTOMY  childhood   Patient Active Problem List   Diagnosis Date Noted   S/P reverse total shoulder arthroplasty, left 08/07/2023   Pre-op evaluation 01/17/2023   Left shoulder pain 11/17/2022   Hypertrophic cardiomyopathy (HCC) 08/12/2022   Chronic anticoagulation 08/12/2022   Hypotension 04/27/2022   H/O: CVA (cerebrovascular accident) 03/14/2022   Urinary retention 01/26/2022   Acute ischemic stroke (HCC) 01/24/2022   Middle cerebral artery embolism, left 01/24/2022   Aneurysm of ascending aorta without rupture (HCC) 01/06/2022   Type 2 diabetes mellitus with stage 3a chronic kidney disease, with long-term current use of insulin  (HCC) 10/30/2021   Type 2 diabetes mellitus with diabetic polyneuropathy, with long-term current use of insulin  (HCC) 10/30/2021   NSTEMI (non-ST elevated myocardial infarction) (HCC) 10/09/2021   Paroxysmal atrial fibrillation (HCC) 10/09/2021   Presence of prosthetic heart valve    Type 2 diabetes mellitus with hyperglycemia    Expressive aphasia 06/25/2021   History of CVA (cerebrovascular accident) 05/2021   S/P lumbar laminectomy 02/04/2021   Lumbar stenosis with neurogenic claudication 02/04/2021   History of falling 01/30/2021   Diabetic polyneuropathy 11/11/2018   Stage 3 chronic kidney disease due to type 2 diabetes mellitus 11/11/2018   History of CABG 11/05/2018   Dyslipidemia 11/05/2018   Primary osteoarthritis, left shoulder 10/31/2018   Long term (current) use of anticoagulants 05/22/2018   Ventral hernia 03/03/2014   OSA (obstructive sleep apnea) 07/12/2012   Hemochromatosis 02/14/2011   Actinic keratosis 02/14/2011   Hypertriglyceridemia 06/13/2010   External hemorrhoids 03/21/2010   History of aortic valve replacement 10/13/2009   Malignant neoplasm of prostate (HCC) 06/17/2009   Bilateral sensorineural  hearing loss 06/16/2009   Essential hypertension 06/16/2009   Coronary atherosclerosis of native coronary artery 06/15/2009   Erectile dysfunction 05/18/2009   Obesity, unspecified 04/29/2008    PCP: Daryl Setter, NP   REFERRING PROVIDER: Josefina Chew, MD   REFERRING DIAG: [phone] (ICD-10-CM) - Status post reverse total shoulder replacement, left  THERAPY DIAG:  Stiffness of left shoulder, not elsewhere classified  Acute pain of left shoulder  Muscle weakness (generalized)  Abnormal posture  RATIONALE FOR EVALUATION AND TREATMENT: Rehabilitation  ONSET DATE: 08/07/2023 - L reverse TSA  NEXT MD VISIT: 09/19/23   SUBJECTIVE:  SUBJECTIVE STATEMENT:  Reports doing well, has a lot of questions regarding the HEP on the APP, wants me to change it up  PAIN: Are you having pain? Yes: NPRS scale: 0/10 currently, intermittent up to 1/10 Pain location: L shoulder  Pain description: ache  Aggravating factors: perturbation to arm  Relieving factors: ice, Tylenol  PRN   PERTINENT HISTORY:  Aneurysm of ascending aorta, aortic stenosis, B sensorineural hearing loss - extremely HOH, CAD s/p CABG, cardiomyopathy, AVR, chronic anticoagulation, DM-II with neuropathy, CVA of MCA, HTN, expressive aphasia, gout, lumbar stenosis s/p lumbar laminectomy and decompression 2023, shoulder OA, CKD, ventral hernia, hernia repair, hip surgery, hx multiple strokes (3-4), prostate cancer   PRECAUTIONS: Shoulder -  Avoid resisted IR/backward extension until 3 months post-op to protect subscapularis repair Progress patient in scapular plane from PROM to AAROM to AROM ensuring proper kinematics throughout care to avoid over stress to deltoid/deltoid attachments   RED FLAGS: None  HAND DOMINANCE:  Right  WEIGHT BEARING RESTRICTIONS: Yes Phase I (1-4 wks): <1 lb; Phase II-IV (4-12 wks): <5 lbs; Phase V (12-16 wks): advance to tolerance; Phase VI (16 wks to 6 mo): Full  FALLS:  Has patient fallen in last 6 months? Yes. Number of falls 1 - fell in garden in May  LIVING ENVIRONMENT:  Lives with: lives with their family Lives in: House/apartment Stairs: No Has following equipment at home: Environmental consultant - 2 wheeled, Crutches, and Grab bars  OCCUPATION: Retired  PLOF: Independent with household mobility without device, Independent with community mobility with device, Needs assistance with ADLs, and Leisure: gardening, 2-3 days/wk at [employer]  PATIENT GOALS: To use normal ROM (for my arm) as much as I can. To not be in pain all the time.   OBJECTIVE: (objective measures completed at initial evaluation unless otherwise dated)  DIAGNOSTIC FINDINGS:  08/07/23 - DG Left Shoulder: CLINICAL DATA:  Status post reverse shoulder arthroplasty.   FINDINGS: Total left shoulder arthroplasty. The arthroplasty components appear intact and in anatomic alignment. There is no acute fracture ordislocation. Postsurgical changes in the soft tissues of the leftshoulder.   IMPRESSION: Total left shoulder arthroplasty.  PATIENT SURVEYS:  Quick Dash: 68.2 / 100 = 68.2 % Minimally Clinically Important Difference (MCID): 15-20 points  COGNITION: Overall cognitive status: Within functional limits for tasks assessed     SENSATION: WFL  POSTURE: rounded shoulders and forward head  UPPER EXTREMITY ROM:   Passive ROM Left eval L 08/30/23 L 09/06/23 L 09/14/23 L 10/03/23 Left AROM Standing 10/12/23  Shoulder flexion 58 90 110 119 120 108  Shoulder extension        Shoulder abduction 50 scaption 90 scaption 105 117 scaption 128 102  Shoulder adduction        Shoulder internal rotation        Shoulder external rotation -6 from neutral  24 at ~30 scaption 21 31 at ~30 scaption 30 40 at 90 degrees  abduction   Active ROM Right  Left   Shoulder flexion    Shoulder extension    Shoulder abduction    Shoulder adduction    Shoulder internal rotation    Shoulder external rotation    Elbow flexion    Elbow extension    Wrist flexion    Wrist extension    Wrist ulnar deviation    Wrist radial deviation    Wrist pronation    Wrist supination    (Blank rows = not tested)  UPPER EXTREMITY MMT: (deferred on eval  d/t only 1 week postop)  MMT Right  Left   Shoulder flexion    Shoulder extension    Shoulder abduction    Shoulder adduction    Shoulder internal rotation    Shoulder external rotation    Middle trapezius    Lower trapezius    Elbow flexion    Elbow extension    Wrist flexion    Wrist extension    Wrist ulnar deviation    Wrist radial deviation    Wrist pronation    Wrist supination    Grip strength (lbs)    (Blank rows = not tested)  JOINT MOBILITY TESTING:  Patient very guarded with initial PROM  PALPATION/OBSERVATION: Bruising draining into upper arm.  Skin at cubital fossa pale and slightly moist/macerated.    TODAY'S TREATMENT:  10/12/23 Pulleys Flexion and scaption Reviewed and altered his HEP as he requested many changes Issued red tband Supine 3# punches with PT limiting motion Supine 3# serratus punches Supine 3# isometric circles Supine 2# flexion from 90 degrees to his tolerance Supine 1# ER from belly with elbow at 60 degrees abduction Right sidelying 1# ER holding elbow at side All above 2x10 Supine PROM, jome gentle joint distraction Measured AROM in standing  10/08/2023 THERAPEUTIC ACTIVITIES: To improve functional performance with overhead reach.  Demonstration, verbal and tactile cues throughout for technique. Pulleys - flexion and scaption x 3' each - cues to remain in pain free ROM Inclined L shoulder flexion 2 x 10- cues to avoid painful ROM Inclined L shoulder scaption 2 x 10 - cues to avoid painful ROM  NEUROMUSCULAR  RE-EDUCATION: To improve coordination, kinesthesia, posture, proprioception, and amplitude of movement. Seated bent over prone scapular retraction with (cues to avoid shoulder shrug and avoid extension ROM beyond neutral): Row to neutral 2 x 10 Shoulder extension to neutral 2 x 10 L shoulder rhythmic stabilization at 90 flexion x 30 sec with 2-finger perturbation at elbow, 2 x 30 sec with 2-finger perturbation at distal forearm  Supine L shoulder CW/CCW small circles at 90 flexion 2 x 10 each direction (cues to keep circular motion small amplitude) L shoulder YTB reactive isometrics step-outs - neutral shoulder with elbow at 90: Flexion x 10 IR x 10 ER x 5 10/05/2023 THERAPEUTIC ACTIVITIES: To improve functional performance with overhead reach.  Demonstration, verbal and tactile cues throughout for technique. Pulleys - flexion and scaption x 3' each - cues to remain in pain free ROM  NEUROMUSCULAR RE-EDUCATION: To improve coordination, kinesthesia, posture, proprioception, and amplitude of movement. Seated bent over prone scapular retraction with (cues to avoid shoulder shrug and avoid extension ROM beyond neutral): Row to neutral 2 x 10 Shoulder extension to neutral 2 x 10 L shoulder rhythmic stabilization at 90 flexion x 30 sec with 2-finger perturbation at elbow, 2 x 30 sec with 2-finger perturbation at distal forearm Supine L shoulder CW/CCW small circles at 90 flexion 2 x 10 each direction (cues to keep circular motion small amplitude) L shoulder YTB reactive isometrics step-outs - neutral shoulder with elbow at 90: Flexion x 10 IR x 10 ER x 5   10/03/2023  THERAPEUTIC ACTIVITY: to improve ability to reach overhead Pulleys - flexion and scaption x 3' each - cues to remain in pain free ROM Supine inclined shoulder AAROM flexion with wand x 10 Supine inclined shoulder AROM flexion 2 x 10 Supine inclined shoulder AROM scaption with wand 2 x 10  NEUROMUSCULAR RE-EDUCATION: To  improve coordination, kinesthesia, posture,  and proprioception. Standing YTB row 3 x 10 (VC & TC to avoid shoulder shrug) Standing YTB shoulder extension to neutral 3 x 10 Prone scapular retraction with (cues to avoid shoulder shrug): Row to neutral 3 x 10 Shoulder extension to neutral 3 x 10  09/25/2023  THERAPEUTIC EXERCISE: To improve strength, endurance, ROM, and flexibility.  Demonstration, verbal and tactile cues throughout for technique. Pulleys - flexion and scaption x 3' each - cues to remain in pain free ROM Supine L shoulder AROM flexion with elbow flexed (uppercut) 2 x 10 Supine L shoulder AROM scaption with elbow flexed (hitchhiker) 2 x 10  NEUROMUSCULAR RE-EDUCATION: To improve coordination, kinesthesia, posture, and proprioception. Standing YTB scap retraction + B shoulder row 2 x 10 (VC & TC to avoid shoulder shrug) Standing YTB scap retraction + B shoulder extension to neutral 2 x 10 Prone scapular retraction with (cues to avoid shoulder shrug): Row to neutral 2 x 10 Shoulder extension to neutral 2 x 10 L shoulder rhythmic stabilization at 90 flexion x 30 sec with 2-finger perturbation at elbow, 2 x 30 sec with 2-finger perturbation at distal forearm      PATIENT EDUCATION:  Education details: HEP review, HEP update, postural awareness, and reminders for surgical protocol precautions  Person educated: Patient Education method: Explanation, Demonstration, Verbal cues, and MedBridge[employer] app updated Education comprehension: verbalized understanding, returned demonstration, verbal cues required, and needs further education  HOME EXERCISE PROGRAM: *Pt using MedBridge[employer] app  Access Code: [email or web address] URL: [email or web address] Date: 10/08/2023 Prepared by: Sol Gaskins  Exercises - Seated Shoulder Flexion AAROM with Pulley Behind  - 1 x daily - 7 x weekly - 3 min hold - Seated Shoulder Scaption AAROM with Pulley at Side  - 1 x daily - 7 x weekly - 3 min  hold - Seated Shoulder External Rotation AAROM with Cane and Hand in Neutral (Mirrored)  - 2-3 x daily - 7 x weekly - 2 sets - 10 reps - 3 sec hold - Seated Isometric Shoulder Flexion  - 2 x daily - 7 x weekly - 2 sets - 5 reps - 5 sec hold - Isometric Shoulder Abduction  - 2 x daily - 7 x weekly - 2 sets - 5 reps - 5 sec hold - Seated Isometric Shoulder External Rotation  - 2 x daily - 7 x weekly - 2 sets - 5 reps - 5 sec hold - Standing Shoulder Row with Anchored Resistance  - 1 x daily - 5 x weekly - 2 sets - 10 reps - 5 sec hold - Shoulder extension with resistance - Neutral  - 1 x daily - 5 x weekly - 2 sets - 10 reps - 5 sec hold - Shoulder Internal Rotation Reactive Isometrics  - 1 x daily - 7 x weekly - 1 sets - 10 reps - Shoulder external rotation  - 1 x daily - 7 x weekly - 1 sets - 10 reps - Standing Shoulder Flexion Reactive Isometric  - 1 x daily - 7 x weekly - 1 sets - 10 reps - Seated Bent Over Shoulder Row with Dumbbells  - 1 x daily - 5 x weekly - 2 sets - 10 reps - 3 sec hold - Seated Shoulder Extension  - 1 x daily - 5 x weekly - 2 sets - 10 reps - 3 sec hold - Supine Shoulder Circles  - 1 x daily - 5 x weekly - 2 sets - 10 reps - 3 sec hold -  Supine Shoulder Flexion AAROM with Dowel  - 1 x daily - 7 x weekly - 2 sets - 10 reps - 3 sec hold   ASSESSMENT:  CLINICAL IMPRESSION: Continued to progress with AROM and strengthening for L shoulder per protocol. Pt showed good demonstration of exercises today. He had a lot of questions and wanted me to change the HEP order and reps, so we worked on that, did some supine activities with weights and then PROM, measured AROM in standing,  Cues provided throughout session as needed. [name] will benefit from continued skilled PT to address ongoing ROM and strength deficits to improve mobility and activity tolerance with decreased pain interference.   EVAL: [name] is a 78 y.o. male who was referred to physical therapy for  evaluation and treatment s/p L reverse TSA on 08/07/2023.  Patient reports nerve block wore off on POD #2 but pain has been mild (</= 3/10) since. Pain is worse with with perturbation to L UE.  Patient has deficits in L shoulder ROM and flexibility, B shoulder and postural strength, and abnormal posture which are interfering with ADLs and are impacting quality of life.  On QuickDASH patient scored 68.2/100 demonstrating 68.2% disability.  Potential for possible skin breakdown observed in L cubital fossa from moisture trapped while being in sling, therefore pt encouraged to spend a little more time out of the sling while seated to allow for air circulation to skin.  [name] will benefit from skilled PT to address above deficits to improve mobility and activity tolerance with decreased pain interference.  OBJECTIVE IMPAIRMENTS: decreased activity tolerance, decreased knowledge of condition, decreased mobility, decreased ROM, decreased strength, increased fascial restrictions, impaired perceived functional ability, increased muscle spasms, impaired flexibility, improper body mechanics, postural dysfunction, and pain.   ACTIVITY LIMITATIONS: carrying, lifting, sleeping, bed mobility, bathing, toileting, dressing, self feeding, reach over head, and caring for others  PARTICIPATION LIMITATIONS: meal prep, cleaning, laundry, driving, and community activity  PERSONAL FACTORS: Age, Fitness, Past/current experiences, Time since onset of injury/illness/exacerbation, and 3+ comorbidities: Aneurysm of ascending aorta, aortic stenosis, B sensorineural hearing loss - extremely HOH, CAD s/p CABG, cardiomyopathy, AVR, chronic anticoagulation, DM-II with neuropathy, CVA of MCA, HTN, expressive aphasia, gout, lumbar stenosis s/p lumbar laminectomy and decompression 2023, shoulder OA, CKD, ventral hernia, hernia repair, hip surgery, hx multiple strokes (3-4), prostate cancer  are also affecting patient's functional outcome.    REHAB POTENTIAL: Good  CLINICAL DECISION MAKING: Evolving/moderate complexity  EVALUATION COMPLEXITY: Moderate   GOALS: Goals reviewed with patient? Yes  SHORT TERM GOALS: Target date: 09/26/2023  Patient will be independent with initial HEP to improve outcomes and carryover.  Baseline: Initial HEP provided on eval 08/30/23 - reviewed HEP and reinforced only P/AAROM at this phase of rehab protocol 09/14/23 - reviewed HEP and reinforced only P/AAROM at this phase of rehab protocol Goal status: MET - 09/20/23  2.  Patient will report 25% improvement in L shoulder pain to improve QOL.   Baseline: up to 3/10 Goal status: MET - 09/14/23 - 85% improved   3.  Patient will improve L shoulder flexion and scaption to >/=90 to improve functional use of L UE. Baseline: Refer to above UE ROM table Goal status: MET - 09/06/23 see ROM table  LONG TERM GOALS: Target date: 11/07/2023  Patient will be independent with ongoing/advanced HEP for self-management at home.  Baseline:  09/25/23 - HEP updated today Goal status: IN PROGRESS - 10/05/23 - HEP updated today  2.  Patient will report 50-75% improvement in L shoulder pain to improve QOL.  Baseline: up to 3/10 Goal status: MET - 09/14/23 - 85% improved   3.  Patient to demonstrate improved upright posture with posterior shoulder girdle engaged to promote improved glenohumeral joint mobility. Baseline:  Goal status: progressing 10/12/23  4.  Patient to improve L shoulder AROM to [facility] without pain provocation to allow for increased ease of ADLs.  Baseline: Refer to above UE ROM table Goal status: IN PROGRESS - 10/03/23 - L shoulder flexion and scaption/abduction nearing expected outcomes status post reverse TSA  5.  Patient will demonstrate improved L shoulder strength to >/= 4/5 for functional UE use. Baseline: Refer to above UE MMT table Goal status: INITIAL  6  Patient will report </= 50% on QuickDASH  to demonstrate improved functional  ability.  Baseline: 68.2 / 100 = 68.2 % Goal status: INITIAL    PLAN:  PT FREQUENCY: 1-2x/week - 1x/wk for rehab phase I (1-4 wks), 1-2x/wk thereafter  PT DURATION: 12 weeks  PLANNED INTERVENTIONS: 02835- PT Re-evaluation, 97750- Physical Performance Testing, 97110-Therapeutic exercises, 97530- Therapeutic activity, [id number]- Neuromuscular re-education, 97535- Self Care, 02859- Manual therapy, [id number]- Electrical stimulation (unattended), 97016- Vasopneumatic device, 97035- Ultrasound, 02966- Ionotophoresis 4mg /ml Dexamethasone , Patient/Family education, Taping, Joint mobilization, Cryotherapy, and Moist heat  PLAN FOR NEXT SESSION: review isometric step outs: Gradually progress into L reverse TSA protocol phase III (6-10 wks) - post-op week #9 as of 10/09/23 (surgery 08/07/23)  PRECAUTIONS: Shoulder -  Avoid resisted IR/backward extension until 3 months post-op to protect subscapularis repair Progress patient in scapular plane from PROM to AAROM to AROM ensuring proper kinematics throughout care to avoid over stress to deltoid/deltoid attachments    OBADIAH OZELL ORN, PT 10/12/2023, 8:46 AM

## 2023-10-15 ENCOUNTER — Ambulatory Visit: Admitting: Rehabilitation

## 2023-10-15 DIAGNOSIS — M25512 Pain in left shoulder: Secondary | ICD-10-CM

## 2023-10-15 DIAGNOSIS — M6281 Muscle weakness (generalized): Secondary | ICD-10-CM | POA: Diagnosis not present

## 2023-10-15 DIAGNOSIS — R293 Abnormal posture: Secondary | ICD-10-CM

## 2023-10-15 DIAGNOSIS — M25612 Stiffness of left shoulder, not elsewhere classified: Secondary | ICD-10-CM | POA: Diagnosis not present

## 2023-10-15 DIAGNOSIS — G4733 Obstructive sleep apnea (adult) (pediatric): Secondary | ICD-10-CM | POA: Diagnosis not present

## 2023-10-15 DIAGNOSIS — R2681 Unsteadiness on feet: Secondary | ICD-10-CM | POA: Diagnosis not present

## 2023-10-15 DIAGNOSIS — Z7409 Other reduced mobility: Secondary | ICD-10-CM | POA: Diagnosis not present

## 2023-10-15 DIAGNOSIS — I10 Essential (primary) hypertension: Secondary | ICD-10-CM | POA: Diagnosis not present

## 2023-10-15 NOTE — Telephone Encounter (Signed)
 Done

## 2023-10-15 NOTE — Therapy (Signed)
 OUTPATIENT PHYSICAL THERAPY TREATMENT    Patient Name: [name] MRN: [id number] DOB:26-Feb-1945, 78 y.o., male Today's Date: 10/15/2023   END OF SESSION:  PT End of Session - 10/15/23 1657     Visit Number 12    Date for PT Re-Evaluation 11/07/23    Authorization Type [employer]    PT Start Time 1148    PT Stop Time 1230    PT Time Calculation (min) 42 min    Activity Tolerance Patient tolerated treatment well    Behavior During Therapy [facility] for tasks assessed/performed                Past Medical History:  Diagnosis Date   Actinic keratosis 02/14/2011   Allergy    Aneurysm of ascending aorta without rupture (HCC) 01/06/2022   Aortic stenosis    Arthritis    Bilateral sensorineural hearing loss 06/16/2009   CAD (coronary artery disease)    cabg   Complication of anesthesia    Coronary atherosclerosis of native coronary artery 06/15/2009   Diabetic polyneuropathy 11/11/2018   Dyslipidemia 11/05/2018   Embolic stroke involving left middle cerebral artery  05/30/2021   Erectile dysfunction 05/18/2009   Essential hypertension 06/16/2009   Expressive aphasia 06/25/2021   External hemorrhoids 03/21/2010   Gout 04/09/2015   Heart murmur    Hemochromatosis 02/14/2011   History of aortic valve replacement 10/13/2009   History of CABG 11/05/2018   2005- 1. Coronary artery bypass grafting x4 with LIMA-LAD, SVG-OM1, SVG-AM and dCFX.  2. Aortic valve replacement with [employer] AVR 3. Septal myomectomy.  4. Myoview low risk 2016   History of hepatitis 01/31/1972   Hypertriglyceridemia 06/13/2010   Hypertrophic obstructive cardiomyopathy 05/06/2008   SP septal myomectomy at surgery 2005 Echo Nov 2018- Hyperdynamic LVEF with severe basal septal hypertrophy. There is chordal SAM with resting gradient of 16 mmHg that increases to 85 mmHg with Valsalva        Leg weakness, bilateral 01/18/2021   Long term (current) use of anticoagulants 05/22/2018   Lumbar stenosis  with neurogenic claudication 02/04/2021   Malignant neoplasm of prostate 06/17/2009   Mechanical heart valve present    Manufacturer: Deitra Rosemary Blade #: [id number]  Model #: [id number]. Card states MRI compatible with 3 teslas or less.   Obesity, unspecified 04/29/2008   OSA (obstructive sleep apnea) 07/12/2012   moderate-severe; uses CPAP nightly   PONV (postoperative nausea and vomiting)    after CABG- slow to wake up   Presence of prosthetic heart valve    Primary osteoarthritis of left shoulder 10/31/2018   S/P lumbar laminectomy 02/04/2021   Stage 3 chronic kidney disease due to type 2 diabetes mellitus 11/11/2018   Tobacco use 06/16/2009   Type 2 diabetes mellitus with hyperglycemia    Ventral hernia 03/03/2014   Past Surgical History:  Procedure Laterality Date   AORTIC VALVE REPLACEMENT  11/14/2003   [employer] [facility]   APPENDECTOMY  1990   CHOLECYSTECTOMY  1990   CORONARY ANGIOGRAPHY  10/11/2021   CORONARY ARTERY BYPASS GRAFT  10/2003   HERNIA REPAIR  1999   right, inguinal   HERNIA REPAIR  2002   left, inguinal   HIP SURGERY  2006   right hip   IR CT HEAD LTD  02/03/2022   IR PERCUTANEOUS ART THROMBECTOMY/INFUSION INTRACRANIAL INC DIAG ANGIO  01/24/2022   LUMBAR LAMINECTOMY/DECOMPRESSION MICRODISCECTOMY Bilateral 02/04/2021   Procedure: Bilateral Lumbar Two-Three Laminectomy;  Surgeon: Colon Shove, MD;  Location: [facility] OR;  Service: Neurosurgery;  Laterality: Bilateral;  3C/RM 20   PILONIDAL CYST EXCISION  1964   prostate seed implant  03/2010   RADIOLOGY WITH ANESTHESIA N/A 01/24/2022   Procedure: IR WITH ANESTHESIA;  Surgeon: Radiologist, Medication, MD;  Location: [facility] OR;  Service: Radiology;  Laterality: N/A;   REVERSE SHOULDER ARTHROPLASTY Left 08/07/2023   Procedure: ARTHROPLASTY, SHOULDER, TOTAL, REVERSE;  Surgeon: Josefina Chew, MD;  Location: [facility] ORS;  Service: Orthopedics;  Laterality: Left;   TEE WITHOUT CARDIOVERSION N/A 03/14/2022   Procedure: TRANSESOPHAGEAL  ECHOCARDIOGRAM (TEE);  Surgeon: Loni Soyla LABOR, MD;  Location: [facility] ENDOSCOPY;  Service: Cardiology;  Laterality: N/A;   TONSILLECTOMY  childhood   Patient Active Problem List   Diagnosis Date Noted   S/P reverse total shoulder arthroplasty, left 08/07/2023   Pre-op evaluation 01/17/2023   Left shoulder pain 11/17/2022   Hypertrophic cardiomyopathy (HCC) 08/12/2022   Chronic anticoagulation 08/12/2022   Hypotension 04/27/2022   H/O: CVA (cerebrovascular accident) 03/14/2022   Urinary retention 01/26/2022   Acute ischemic stroke (HCC) 01/24/2022   Middle cerebral artery embolism, left 01/24/2022   Aneurysm of ascending aorta without rupture (HCC) 01/06/2022   Type 2 diabetes mellitus with stage 3a chronic kidney disease, with long-term current use of insulin  (HCC) 10/30/2021   Type 2 diabetes mellitus with diabetic polyneuropathy, with long-term current use of insulin  (HCC) 10/30/2021   NSTEMI (non-ST elevated myocardial infarction) (HCC) 10/09/2021   Paroxysmal atrial fibrillation (HCC) 10/09/2021   Presence of prosthetic heart valve    Type 2 diabetes mellitus with hyperglycemia    Expressive aphasia 06/25/2021   History of CVA (cerebrovascular accident) 05/2021   S/P lumbar laminectomy 02/04/2021   Lumbar stenosis with neurogenic claudication 02/04/2021   History of falling 01/30/2021   Diabetic polyneuropathy 11/11/2018   Stage 3 chronic kidney disease due to type 2 diabetes mellitus 11/11/2018   History of CABG 11/05/2018   Dyslipidemia 11/05/2018   Primary osteoarthritis, left shoulder 10/31/2018   Long term (current) use of anticoagulants 05/22/2018   Ventral hernia 03/03/2014   OSA (obstructive sleep apnea) 07/12/2012   Hemochromatosis 02/14/2011   Actinic keratosis 02/14/2011   Hypertriglyceridemia 06/13/2010   External hemorrhoids 03/21/2010   History of aortic valve replacement 10/13/2009   Malignant neoplasm of prostate (HCC) 06/17/2009   Bilateral sensorineural  hearing loss 06/16/2009   Essential hypertension 06/16/2009   Coronary atherosclerosis of native coronary artery 06/15/2009   Erectile dysfunction 05/18/2009   Obesity, unspecified 04/29/2008    PCP: Daryl Setter, NP   REFERRING PROVIDER: Josefina Chew, MD   REFERRING DIAG: [phone] (ICD-10-CM) - Status post reverse total shoulder replacement, left  THERAPY DIAG:  Stiffness of left shoulder, not elsewhere classified  Acute pain of left shoulder  Muscle weakness (generalized)  Abnormal posture  Unsteadiness on feet  Impaired functional mobility and activity tolerance  RATIONALE FOR EVALUATION AND TREATMENT: Rehabilitation  ONSET DATE: 08/07/2023 - L reverse TSA  NEXT MD VISIT: 09/19/23   SUBJECTIVE:  SUBJECTIVE STATEMENT:  States he went to [employer] this morning and did work on shoulder pulleys.   Advised patient that he should not be doing any lifting weights or any other exercises for his L shoulder other than what we are doing in physical therapy.   He verbalizes understanding and agreement.  PAIN: Are you having pain? Yes: NPRS scale: 0/10 currently, intermittent up to 1/10 Pain location: L shoulder  Pain description: ache  Aggravating factors: perturbation to arm  Relieving factors: ice, Tylenol  PRN   PERTINENT HISTORY:  Aneurysm of ascending aorta, aortic stenosis, B sensorineural hearing loss - extremely HOH, CAD s/p CABG, cardiomyopathy, AVR, chronic anticoagulation, DM-II with neuropathy, CVA of MCA, HTN, expressive aphasia, gout, lumbar stenosis s/p lumbar laminectomy and decompression 2023, shoulder OA, CKD, ventral hernia, hernia repair, hip surgery, hx multiple strokes (3-4), prostate cancer   PRECAUTIONS: Shoulder -  Avoid resisted IR/backward  extension until 3 months post-op to protect subscapularis repair Progress patient in scapular plane from PROM to AAROM to AROM ensuring proper kinematics throughout care to avoid over stress to deltoid/deltoid attachments   RED FLAGS: None  HAND DOMINANCE: Right  WEIGHT BEARING RESTRICTIONS: Yes Phase I (1-4 wks): <1 lb; Phase II-IV (4-12 wks): <5 lbs; Phase V (12-16 wks): advance to tolerance; Phase VI (16 wks to 6 mo): Full  FALLS:  Has patient fallen in last 6 months? Yes. Number of falls 1 - fell in garden in May  LIVING ENVIRONMENT:  Lives with: lives with their family Lives in: House/apartment Stairs: No Has following equipment at home: Environmental consultant - 2 wheeled, Crutches, and Grab bars  OCCUPATION: Retired  PLOF: Independent with household mobility without device, Independent with community mobility with device, Needs assistance with ADLs, and Leisure: gardening, 2-3 days/wk at [employer]  PATIENT GOALS: To use normal ROM (for my arm) as much as I can. To not be in pain all the time.   OBJECTIVE: (objective measures completed at initial evaluation unless otherwise dated)  DIAGNOSTIC FINDINGS:  08/07/23 - DG Left Shoulder: CLINICAL DATA:  Status post reverse shoulder arthroplasty.   FINDINGS: Total left shoulder arthroplasty. The arthroplasty components appear intact and in anatomic alignment. There is no acute fracture ordislocation. Postsurgical changes in the soft tissues of the leftshoulder.   IMPRESSION: Total left shoulder arthroplasty.  PATIENT SURVEYS:  Quick Dash: 68.2 / 100 = 68.2 % Minimally Clinically Important Difference (MCID): 15-20 points  COGNITION: Overall cognitive status: Within functional limits for tasks assessed     SENSATION: WFL  POSTURE: rounded shoulders and forward head  UPPER EXTREMITY ROM:   Passive ROM Left eval L 08/30/23 L 09/06/23 L 09/14/23 L 10/03/23 Left AROM Standing 10/12/23  Shoulder flexion 58 90 110 119 120 108  Shoulder  extension        Shoulder abduction 50 scaption 90 scaption 105 117 scaption 128 102  Shoulder adduction        Shoulder internal rotation        Shoulder external rotation -6 from neutral  24 at ~30 scaption 21 31 at ~30 scaption 30 40 at 90 degrees abduction   Active ROM Right  Left   Shoulder flexion    Shoulder extension    Shoulder abduction    Shoulder adduction    Shoulder internal rotation    Shoulder external rotation    Elbow flexion    Elbow extension    Wrist flexion    Wrist extension    Wrist ulnar deviation  Wrist radial deviation    Wrist pronation    Wrist supination    (Blank rows = not tested)  UPPER EXTREMITY MMT: (deferred on eval d/t only 1 week postop)  MMT Right  Left   Shoulder flexion    Shoulder extension    Shoulder abduction    Shoulder adduction    Shoulder internal rotation    Shoulder external rotation    Middle trapezius    Lower trapezius    Elbow flexion    Elbow extension    Wrist flexion    Wrist extension    Wrist ulnar deviation    Wrist radial deviation    Wrist pronation    Wrist supination    Grip strength (lbs)    (Blank rows = not tested)  JOINT MOBILITY TESTING:  Patient very guarded with initial PROM  PALPATION/OBSERVATION: Bruising draining into upper arm.  Skin at cubital fossa pale and slightly moist/macerated.    TODAY'S TREATMENT:  10/15/23 THERAPEUTIC EXERCISE: To improve ROM.  Demonstration, verbal and tactile cues throughout for technique. Pulleys flexion x 2'; scaption x 2' Supine PROM L shoulder   THERAPEUTIC ACTIVITIES: To improve functional performance.  Demonstration, verbal and tactile cues throughout for technique. Supine 3# serratus press up x 20 LUE Supine 3# small circles at 90 CW x 10; CCW x 10 with manual PT assist Standing counter dust small circles CW x 20; CCW x 20 within precautionary ROM with PT assisting manually Standing shoulder scaption in  L-bar position with cane x 20 LUE with  mirror to prevent shoulder hiking Standing wall dust scaption x 20 LUE Standing ER 1# x 30 LUE with PT manual assist for correct form Standing shoulder rows RTB x 30 BUE w/ PT assist to prevent extension past body Standing shoulder ext RTB x 30 BUE w/ PT assist to prevent extension past body Standing cane ER stretch x 1' x 2   10/12/23 Pulleys Flexion and scaption Reviewed and altered his HEP as he requested many changes Issued red tband Supine 3# punches with PT limiting motion Supine 3# serratus punches Supine 3# isometric circles Supine 2# flexion from 90 degrees to his tolerance Supine 1# ER from belly with elbow at 60 degrees abduction Right sidelying 1# ER holding elbow at side All above 2x10 Supine PROM, jome gentle joint distraction Measured AROM in standing  10/08/2023 THERAPEUTIC ACTIVITIES: To improve functional performance with overhead reach.  Demonstration, verbal and tactile cues throughout for technique. Pulleys - flexion and scaption x 3' each - cues to remain in pain free ROM Inclined L shoulder flexion 2 x 10- cues to avoid painful ROM Inclined L shoulder scaption 2 x 10 - cues to avoid painful ROM  NEUROMUSCULAR RE-EDUCATION: To improve coordination, kinesthesia, posture, proprioception, and amplitude of movement. Seated bent over prone scapular retraction with (cues to avoid shoulder shrug and avoid extension ROM beyond neutral): Row to neutral 2 x 10 Shoulder extension to neutral 2 x 10 L shoulder rhythmic stabilization at 90 flexion x 30 sec with 2-finger perturbation at elbow, 2 x 30 sec with 2-finger perturbation at distal forearm  Supine L shoulder CW/CCW small circles at 90 flexion 2 x 10 each direction (cues to keep circular motion small amplitude) L shoulder YTB reactive isometrics step-outs - neutral shoulder with elbow at 90: Flexion x 10 IR x 10 ER x 5 10/05/2023 THERAPEUTIC ACTIVITIES: To improve functional performance with overhead reach.   Demonstration, verbal and tactile cues throughout for technique.  Pulleys - flexion and scaption x 3' each - cues to remain in pain free ROM  NEUROMUSCULAR RE-EDUCATION: To improve coordination, kinesthesia, posture, proprioception, and amplitude of movement. Seated bent over prone scapular retraction with (cues to avoid shoulder shrug and avoid extension ROM beyond neutral): Row to neutral 2 x 10 Shoulder extension to neutral 2 x 10 L shoulder rhythmic stabilization at 90 flexion x 30 sec with 2-finger perturbation at elbow, 2 x 30 sec with 2-finger perturbation at distal forearm Supine L shoulder CW/CCW small circles at 90 flexion 2 x 10 each direction (cues to keep circular motion small amplitude) L shoulder YTB reactive isometrics step-outs - neutral shoulder with elbow at 90: Flexion x 10 IR x 10 ER x 5   10/03/2023  THERAPEUTIC ACTIVITY: to improve ability to reach overhead Pulleys - flexion and scaption x 3' each - cues to remain in pain free ROM Supine inclined shoulder AAROM flexion with wand x 10 Supine inclined shoulder AROM flexion 2 x 10 Supine inclined shoulder AROM scaption with wand 2 x 10  NEUROMUSCULAR RE-EDUCATION: To improve coordination, kinesthesia, posture, and proprioception. Standing YTB row 3 x 10 (VC & TC to avoid shoulder shrug) Standing YTB shoulder extension to neutral 3 x 10 Prone scapular retraction with (cues to avoid shoulder shrug): Row to neutral 3 x 10 Shoulder extension to neutral 3 x 10  09/25/2023  THERAPEUTIC EXERCISE: To improve strength, endurance, ROM, and flexibility.  Demonstration, verbal and tactile cues throughout for technique. Pulleys - flexion and scaption x 3' each - cues to remain in pain free ROM Supine L shoulder AROM flexion with elbow flexed (uppercut) 2 x 10 Supine L shoulder AROM scaption with elbow flexed (hitchhiker) 2 x 10  NEUROMUSCULAR RE-EDUCATION: To improve coordination, kinesthesia, posture, and  proprioception. Standing YTB scap retraction + B shoulder row 2 x 10 (VC & TC to avoid shoulder shrug) Standing YTB scap retraction + B shoulder extension to neutral 2 x 10 Prone scapular retraction with (cues to avoid shoulder shrug): Row to neutral 2 x 10 Shoulder extension to neutral 2 x 10 L shoulder rhythmic stabilization at 90 flexion x 30 sec with 2-finger perturbation at elbow, 2 x 30 sec with 2-finger perturbation at distal forearm      PATIENT EDUCATION:  Education details: HEP review, HEP update, postural awareness, and reminders for surgical protocol precautions  Person educated: Patient Education method: Explanation, Demonstration, Verbal cues, and MedBridgeGO app updated Education comprehension: verbalized understanding, returned demonstration, verbal cues required, and needs further education  HOME EXERCISE PROGRAM: *Pt using MedBridgeGO app  Access Code: [email or web address] URL: [email or web address] Date: 10/08/2023 Prepared by: Braylin Clark  Exercises - Seated Shoulder Flexion AAROM with Pulley Behind  - 1 x daily - 7 x weekly - 3 min hold - Seated Shoulder Scaption AAROM with Pulley at Side  - 1 x daily - 7 x weekly - 3 min hold - Seated Shoulder External Rotation AAROM with Cane and Hand in Neutral (Mirrored)  - 2-3 x daily - 7 x weekly - 2 sets - 10 reps - 3 sec hold - Seated Isometric Shoulder Flexion  - 2 x daily - 7 x weekly - 2 sets - 5 reps - 5 sec hold - Isometric Shoulder Abduction  - 2 x daily - 7 x weekly - 2 sets - 5 reps - 5 sec hold - Seated Isometric Shoulder External Rotation  - 2 x daily - 7 x weekly - 2 sets - 5  reps - 5 sec hold - Standing Shoulder Row with Anchored Resistance  - 1 x daily - 5 x weekly - 2 sets - 10 reps - 5 sec hold - Shoulder extension with resistance - Neutral  - 1 x daily - 5 x weekly - 2 sets - 10 reps - 5 sec hold - Shoulder Internal Rotation Reactive Isometrics  - 1 x daily - 7 x weekly - 1 sets - 10 reps -  Shoulder external rotation  - 1 x daily - 7 x weekly - 1 sets - 10 reps - Standing Shoulder Flexion Reactive Isometric  - 1 x daily - 7 x weekly - 1 sets - 10 reps - Seated Bent Over Shoulder Row with Dumbbells  - 1 x daily - 5 x weekly - 2 sets - 10 reps - 3 sec hold - Seated Shoulder Extension  - 1 x daily - 5 x weekly - 2 sets - 10 reps - 3 sec hold - Supine Shoulder Circles  - 1 x daily - 5 x weekly - 2 sets - 10 reps - 3 sec hold - Supine Shoulder Flexion AAROM with Dowel  - 1 x daily - 7 x weekly - 2 sets - 10 reps - 3 sec hold   ASSESSMENT:  CLINICAL IMPRESSION:  Patient continues to progress well with PT.   He needs lots of verbal and tactile cues/manual facilitation for correct performance of many exercises.   He is able to teach back that he is not to extend the LUE past his body or reach behind his back.  He asks jokingly about playing golf, and is reminded that he should not do anything outside of what we do with him in therapy.  HE is having minimal to no pain.   PT remains necessary for strength, ROM, safety, ADL, and HEP deficits.  Continue per protocol.   EVAL: CORMICK MOSS is a 77 y.o. male who was referred to physical therapy for evaluation and treatment s/p L reverse TSA on 08/07/2023.  Patient reports nerve block wore off on POD #2 but pain has been mild (</= 3/10) since. Pain is worse with with perturbation to L UE.  Patient has deficits in L shoulder ROM and flexibility, B shoulder and postural strength, and abnormal posture which are interfering with ADLs and are impacting quality of life.  On QuickDASH patient scored 68.2/100 demonstrating 68.2% disability.  Potential for possible skin breakdown observed in L cubital fossa from moisture trapped while being in sling, therefore pt encouraged to spend a little more time out of the sling while seated to allow for air circulation to skin.  Sasha will benefit from skilled PT to address above deficits to improve mobility and  activity tolerance with decreased pain interference.  OBJECTIVE IMPAIRMENTS: decreased activity tolerance, decreased knowledge of condition, decreased mobility, decreased ROM, decreased strength, increased fascial restrictions, impaired perceived functional ability, increased muscle spasms, impaired flexibility, improper body mechanics, postural dysfunction, and pain.   ACTIVITY LIMITATIONS: carrying, lifting, sleeping, bed mobility, bathing, toileting, dressing, self feeding, reach over head, and caring for others  PARTICIPATION LIMITATIONS: meal prep, cleaning, laundry, driving, and community activity  PERSONAL FACTORS: Age, Fitness, Past/current experiences, Time since onset of injury/illness/exacerbation, and 3+ comorbidities: Aneurysm of ascending aorta, aortic stenosis, B sensorineural hearing loss - extremely HOH, CAD s/p CABG, cardiomyopathy, AVR, chronic anticoagulation, DM-II with neuropathy, CVA of MCA, HTN, expressive aphasia, gout, lumbar stenosis s/p lumbar laminectomy and decompression 2023, shoulder OA, CKD,  ventral hernia, hernia repair, hip surgery, hx multiple strokes (3-4), prostate cancer  are also affecting patient's functional outcome.   REHAB POTENTIAL: Good  CLINICAL DECISION MAKING: Evolving/moderate complexity  EVALUATION COMPLEXITY: Moderate   GOALS: Goals reviewed with patient? Yes  SHORT TERM GOALS: Target date: 09/26/2023  Patient will be independent with initial HEP to improve outcomes and carryover.  Baseline: Initial HEP provided on eval 08/30/23 - reviewed HEP and reinforced only P/AAROM at this phase of rehab protocol 09/14/23 - reviewed HEP and reinforced only P/AAROM at this phase of rehab protocol Goal status: MET - 09/20/23  2.  Patient will report 25% improvement in L shoulder pain to improve QOL.   Baseline: up to 3/10 Goal status: MET - 09/14/23 - 85% improved   3.  Patient will improve L shoulder flexion and scaption to >/=90 to improve  functional use of L UE. Baseline: Refer to above UE ROM table Goal status: MET - 09/06/23 see ROM table  LONG TERM GOALS: Target date: 11/07/2023  Patient will be independent with ongoing/advanced HEP for self-management at home.  Baseline:  09/25/23 - HEP updated today Goal status: IN PROGRESS - 10/05/23 - HEP updated today  2.  Patient will report 50-75% improvement in L shoulder pain to improve QOL.  Baseline: up to 3/10 Goal status: MET - 09/14/23 - 85% improved   3.  Patient to demonstrate improved upright posture with posterior shoulder girdle engaged to promote improved glenohumeral joint mobility. Baseline:  Goal status: progressing 10/12/23  4.  Patient to improve L shoulder AROM to [facility] without pain provocation to allow for increased ease of ADLs.  Baseline: Refer to above UE ROM table Goal status: IN PROGRESS - 10/03/23 - L shoulder flexion and scaption/abduction nearing expected outcomes status post reverse TSA  5.  Patient will demonstrate improved L shoulder strength to >/= 4/5 for functional UE use. Baseline: Refer to above UE MMT table Goal status: INITIAL  6  Patient will report </= 50% on QuickDASH  to demonstrate improved functional ability.  Baseline: 68.2 / 100 = 68.2 % Goal status: INITIAL    PLAN:  PT FREQUENCY: 1-2x/week - 1x/wk for rehab phase I (1-4 wks), 1-2x/wk thereafter  PT DURATION: 12 weeks  PLANNED INTERVENTIONS: 02835- PT Re-evaluation, 97750- Physical Performance Testing, 97110-Therapeutic exercises, 97530- Therapeutic activity, [id number]- Neuromuscular re-education, 97535- Self Care, 02859- Manual therapy, [id number]- Electrical stimulation (unattended), [id number]- Vasopneumatic device, [id number]- Ultrasound, 02966- Ionotophoresis 4mg /ml Dexamethasone , Patient/Family education, Taping, Joint mobilization, Cryotherapy, and Moist heat  PLAN FOR NEXT SESSION: review isometric step outs: Gradually progress into L reverse TSA protocol phase IV (10-12 wks) - post-op week #10  as of 10/16/23 (surgery 08/07/23)  PRECAUTIONS: Shoulder -  Avoid resisted IR/backward extension until 3 months post-op to protect subscapularis repair Progress patient in scapular plane from PROM to AAROM to AROM ensuring proper kinematics throughout care to avoid over stress to deltoid/deltoid attachments    Tarra Pence, PT 10/15/2023, 5:05 PM

## 2023-10-17 ENCOUNTER — Ambulatory Visit

## 2023-10-17 DIAGNOSIS — E119 Type 2 diabetes mellitus without complications: Secondary | ICD-10-CM | POA: Diagnosis not present

## 2023-10-17 DIAGNOSIS — M25612 Stiffness of left shoulder, not elsewhere classified: Secondary | ICD-10-CM | POA: Diagnosis not present

## 2023-10-17 DIAGNOSIS — M25512 Pain in left shoulder: Secondary | ICD-10-CM | POA: Diagnosis not present

## 2023-10-17 DIAGNOSIS — R2681 Unsteadiness on feet: Secondary | ICD-10-CM | POA: Diagnosis not present

## 2023-10-17 DIAGNOSIS — M6281 Muscle weakness (generalized): Secondary | ICD-10-CM

## 2023-10-17 DIAGNOSIS — Z7409 Other reduced mobility: Secondary | ICD-10-CM | POA: Diagnosis not present

## 2023-10-17 DIAGNOSIS — R293 Abnormal posture: Secondary | ICD-10-CM

## 2023-10-17 NOTE — Therapy (Signed)
 OUTPATIENT PHYSICAL THERAPY TREATMENT    Patient Name: [name] MRN: [id number] DOB:April 10, 1945, 78 y.o., male Today's Date: 10/17/2023   END OF SESSION:  PT End of Session - 10/17/23 1150     Visit Number 13    Date for PT Re-Evaluation 11/07/23    Authorization Type [employer]    PT Start Time 1104    PT Stop Time 1149    PT Time Calculation (min) 45 min    Activity Tolerance Patient tolerated treatment well    Behavior During Therapy [facility] for tasks assessed/performed                 Past Medical History:  Diagnosis Date   Actinic keratosis 02/14/2011   Allergy    Aneurysm of ascending aorta without rupture (HCC) 01/06/2022   Aortic stenosis    Arthritis    Bilateral sensorineural hearing loss 06/16/2009   CAD (coronary artery disease)    cabg   Complication of anesthesia    Coronary atherosclerosis of native coronary artery 06/15/2009   Diabetic polyneuropathy 11/11/2018   Dyslipidemia 11/05/2018   Embolic stroke involving left middle cerebral artery  05/30/2021   Erectile dysfunction 05/18/2009   Essential hypertension 06/16/2009   Expressive aphasia 06/25/2021   External hemorrhoids 03/21/2010   Gout 04/09/2015   Heart murmur    Hemochromatosis 02/14/2011   History of aortic valve replacement 10/13/2009   History of CABG 11/05/2018   2005- 1. Coronary artery bypass grafting x4 with LIMA-LAD, SVG-OM1, SVG-AM and dCFX.  2. Aortic valve replacement with [employer] AVR 3. Septal myomectomy.  4. Myoview low risk 2016   History of hepatitis 01/31/1972   Hypertriglyceridemia 06/13/2010   Hypertrophic obstructive cardiomyopathy 05/06/2008   SP septal myomectomy at surgery 2005 Echo Nov 2018- Hyperdynamic LVEF with severe basal septal hypertrophy. There is chordal SAM with resting gradient of 16 mmHg that increases to 85 mmHg with Valsalva        Leg weakness, bilateral 01/18/2021   Long term (current) use of anticoagulants 05/22/2018   Lumbar stenosis  with neurogenic claudication 02/04/2021   Malignant neoplasm of prostate 06/17/2009   Mechanical heart valve present    Manufacturer: Deitra Rosemary Blade #: [id number]  Model #: [id number]. Card states MRI compatible with 3 teslas or less.   Obesity, unspecified 04/29/2008   OSA (obstructive sleep apnea) 07/12/2012   moderate-severe; uses CPAP nightly   PONV (postoperative nausea and vomiting)    after CABG- slow to wake up   Presence of prosthetic heart valve    Primary osteoarthritis of left shoulder 10/31/2018   S/P lumbar laminectomy 02/04/2021   Stage 3 chronic kidney disease due to type 2 diabetes mellitus 11/11/2018   Tobacco use 06/16/2009   Type 2 diabetes mellitus with hyperglycemia    Ventral hernia 03/03/2014   Past Surgical History:  Procedure Laterality Date   AORTIC VALVE REPLACEMENT  11/14/2003   [facility]   APPENDECTOMY  1990   CHOLECYSTECTOMY  1990   CORONARY ANGIOGRAPHY  10/11/2021   CORONARY ARTERY BYPASS GRAFT  10/2003   HERNIA REPAIR  1999   right, inguinal   HERNIA REPAIR  2002   left, inguinal   HIP SURGERY  2006   right hip   IR CT HEAD LTD  02/03/2022   IR PERCUTANEOUS ART THROMBECTOMY/INFUSION INTRACRANIAL INC DIAG ANGIO  01/24/2022   LUMBAR LAMINECTOMY/DECOMPRESSION MICRODISCECTOMY Bilateral 02/04/2021   Procedure: Bilateral Lumbar Two-Three Laminectomy;  Surgeon: Colon Shove, MD;  Location: [facility] OR;  Service: Neurosurgery;  Laterality: Bilateral;  3C/RM 20   PILONIDAL CYST EXCISION  1964   prostate seed implant  03/2010   RADIOLOGY WITH ANESTHESIA N/A 01/24/2022   Procedure: IR WITH ANESTHESIA;  Surgeon: Radiologist, Medication, MD;  Location: [facility] OR;  Service: Radiology;  Laterality: N/A;   REVERSE SHOULDER ARTHROPLASTY Left 08/07/2023   Procedure: ARTHROPLASTY, SHOULDER, TOTAL, REVERSE;  Surgeon: Josefina Chew, MD;  Location: [facility] ORS;  Service: Orthopedics;  Laterality: Left;   TEE WITHOUT CARDIOVERSION N/A 03/14/2022   Procedure: TRANSESOPHAGEAL  ECHOCARDIOGRAM (TEE);  Surgeon: Loni Soyla LABOR, MD;  Location: [facility] ENDOSCOPY;  Service: Cardiology;  Laterality: N/A;   TONSILLECTOMY  childhood   Patient Active Problem List   Diagnosis Date Noted   S/P reverse total shoulder arthroplasty, left 08/07/2023   Pre-op evaluation 01/17/2023   Left shoulder pain 11/17/2022   Hypertrophic cardiomyopathy (HCC) 08/12/2022   Chronic anticoagulation 08/12/2022   Hypotension 04/27/2022   H/O: CVA (cerebrovascular accident) 03/14/2022   Urinary retention 01/26/2022   Acute ischemic stroke (HCC) 01/24/2022   Middle cerebral artery embolism, left 01/24/2022   Aneurysm of ascending aorta without rupture (HCC) 01/06/2022   Type 2 diabetes mellitus with stage 3a chronic kidney disease, with long-term current use of insulin  (HCC) 10/30/2021   Type 2 diabetes mellitus with diabetic polyneuropathy, with long-term current use of insulin  (HCC) 10/30/2021   NSTEMI (non-ST elevated myocardial infarction) (HCC) 10/09/2021   Paroxysmal atrial fibrillation (HCC) 10/09/2021   Presence of prosthetic heart valve    Type 2 diabetes mellitus with hyperglycemia    Expressive aphasia 06/25/2021   History of CVA (cerebrovascular accident) 05/2021   S/P lumbar laminectomy 02/04/2021   Lumbar stenosis with neurogenic claudication 02/04/2021   History of falling 01/30/2021   Diabetic polyneuropathy 11/11/2018   Stage 3 chronic kidney disease due to type 2 diabetes mellitus 11/11/2018   History of CABG 11/05/2018   Dyslipidemia 11/05/2018   Primary osteoarthritis, left shoulder 10/31/2018   Long term (current) use of anticoagulants 05/22/2018   Ventral hernia 03/03/2014   OSA (obstructive sleep apnea) 07/12/2012   Hemochromatosis 02/14/2011   Actinic keratosis 02/14/2011   Hypertriglyceridemia 06/13/2010   External hemorrhoids 03/21/2010   History of aortic valve replacement 10/13/2009   Malignant neoplasm of prostate (HCC) 06/17/2009   Bilateral sensorineural  hearing loss 06/16/2009   Essential hypertension 06/16/2009   Coronary atherosclerosis of native coronary artery 06/15/2009   Erectile dysfunction 05/18/2009   Obesity, unspecified 04/29/2008    PCP: Daryl Setter, NP   REFERRING PROVIDER: Josefina Chew, MD   REFERRING DIAG: [phone] (ICD-10-CM) - Status post reverse total shoulder replacement, left  THERAPY DIAG:  Stiffness of left shoulder, not elsewhere classified  Acute pain of left shoulder  Muscle weakness (generalized)  Abnormal posture  RATIONALE FOR EVALUATION AND TREATMENT: Rehabilitation  ONSET DATE: 08/07/2023 - L reverse TSA  NEXT MD VISIT: 09/19/23   SUBJECTIVE:  SUBJECTIVE STATEMENT:  Pt reports he went to PF this morning, only worked with the bands from PT and did PT exercises only. Shoulder is sore this am  PAIN: Are you having pain? Yes: NPRS scale: 0/10 currently, intermittent up to 1/10 Pain location: L shoulder  Pain description: ache  Aggravating factors: perturbation to arm  Relieving factors: ice, Tylenol  PRN   PERTINENT HISTORY:  Aneurysm of ascending aorta, aortic stenosis, B sensorineural hearing loss - extremely HOH, CAD s/p CABG, cardiomyopathy, AVR, chronic anticoagulation, DM-II with neuropathy, CVA of MCA, HTN, expressive aphasia, gout, lumbar stenosis s/p lumbar laminectomy and decompression 2023, shoulder OA, CKD, ventral hernia, hernia repair, hip surgery, hx multiple strokes (3-4), prostate cancer   PRECAUTIONS: Shoulder -  Avoid resisted IR/backward extension until 3 months post-op to protect subscapularis repair Progress patient in scapular plane from PROM to AAROM to AROM ensuring proper kinematics throughout care to avoid over stress to deltoid/deltoid attachments   RED  FLAGS: None  HAND DOMINANCE: Right  WEIGHT BEARING RESTRICTIONS: Yes Phase I (1-4 wks): <1 lb; Phase II-IV (4-12 wks): <5 lbs; Phase V (12-16 wks): advance to tolerance; Phase VI (16 wks to 6 mo): Full  FALLS:  Has patient fallen in last 6 months? Yes. Number of falls 1 - fell in garden in May  LIVING ENVIRONMENT:  Lives with: lives with their family Lives in: House/apartment Stairs: No Has following equipment at home: Environmental consultant - 2 wheeled, Crutches, and Grab bars  OCCUPATION: Retired  PLOF: Independent with household mobility without device, Independent with community mobility with device, Needs assistance with ADLs, and Leisure: gardening, 2-3 days/wk at [employer]  PATIENT GOALS: To use normal ROM (for my arm) as much as I can. To not be in pain all the time.   OBJECTIVE: (objective measures completed at initial evaluation unless otherwise dated)  DIAGNOSTIC FINDINGS:  08/07/23 - DG Left Shoulder: CLINICAL DATA:  Status post reverse shoulder arthroplasty.   FINDINGS: Total left shoulder arthroplasty. The arthroplasty components appear intact and in anatomic alignment. There is no acute fracture ordislocation. Postsurgical changes in the soft tissues of the leftshoulder.   IMPRESSION: Total left shoulder arthroplasty.  PATIENT SURVEYS:  Quick Dash: 68.2 / 100 = 68.2 % Minimally Clinically Important Difference (MCID): 15-20 points  COGNITION: Overall cognitive status: Within functional limits for tasks assessed     SENSATION: WFL  POSTURE: rounded shoulders and forward head  UPPER EXTREMITY ROM:   Passive ROM Left eval L 08/30/23 L 09/06/23 L 09/14/23 L 10/03/23 Left AROM Standing 10/12/23  Shoulder flexion 58 90 110 119 120 108  Shoulder extension        Shoulder abduction 50 scaption 90 scaption 105 117 scaption 128 102  Shoulder adduction        Shoulder internal rotation        Shoulder external rotation -6 from neutral  24 at ~30 scaption 21 31 at ~30  scaption 30 40 at 90 degrees abduction   Active ROM Right  Left  10/17/23  Shoulder flexion  110  Shoulder extension    Shoulder abduction  Scaption- 110  Shoulder adduction    Shoulder internal rotation    Shoulder external rotation    Elbow flexion    Elbow extension    Wrist flexion    Wrist extension    Wrist ulnar deviation    Wrist radial deviation    Wrist pronation    Wrist supination    (Blank rows = not tested)  UPPER EXTREMITY MMT: (deferred on eval d/t only 1 week postop)  MMT Right  Left   Shoulder flexion    Shoulder extension    Shoulder abduction    Shoulder adduction    Shoulder internal rotation    Shoulder external rotation    Middle trapezius    Lower trapezius    Elbow flexion    Elbow extension    Wrist flexion    Wrist extension    Wrist ulnar deviation    Wrist radial deviation    Wrist pronation    Wrist supination    Grip strength (lbs)    (Blank rows = not tested)  JOINT MOBILITY TESTING:  Patient very guarded with initial PROM  PALPATION/OBSERVATION: Bruising draining into upper arm.  Skin at cubital fossa pale and slightly moist/macerated.    TODAY'S TREATMENT:  10/17/23 Pulleys 3 min flexion and scaption All exercises with L UE: Counter slides into flexion x 20, scaption x 20 w/ 1lb weight Counter CW/CCW circles x 20 each way into flexion Wall slide flexion x 10 Serratus punch 3lb x 20 CW/CCW circles supine 3lb x 10 each way STM to L anterior deltoid MWM into shoulder flexion ER PROM supine  Standing AAROM flexion with cane x 10; scaption x 10   10/15/23 THERAPEUTIC EXERCISE: To improve ROM.  Demonstration, verbal and tactile cues throughout for technique. Pulleys flexion x 2'; scaption x 2' Supine PROM L shoulder   THERAPEUTIC ACTIVITIES: To improve functional performance.  Demonstration, verbal and tactile cues throughout for technique. Supine 3# serratus press up x 20 LUE Supine 3# small circles at 90 CW x 10; CCW x 10  with manual PT assist Standing counter dust small circles CW x 20; CCW x 20 within precautionary ROM with PT assisting manually Standing shoulder scaption in  L-bar position with cane x 20 LUE with mirror to prevent shoulder hiking Standing wall dust scaption x 20 LUE Standing ER 1# x 30 LUE with PT manual assist for correct form Standing shoulder rows RTB x 30 BUE w/ PT assist to prevent extension past body Standing shoulder ext RTB x 30 BUE w/ PT assist to prevent extension past body Standing cane ER stretch x 1' x 2   10/12/23 Pulleys Flexion and scaption Reviewed and altered his HEP as he requested many changes Issued red tband Supine 3# punches with PT limiting motion Supine 3# serratus punches Supine 3# isometric circles Supine 2# flexion from 90 degrees to his tolerance Supine 1# ER from belly with elbow at 60 degrees abduction Right sidelying 1# ER holding elbow at side All above 2x10 Supine PROM, jome gentle joint distraction Measured AROM in standing  10/08/2023 THERAPEUTIC ACTIVITIES: To improve functional performance with overhead reach.  Demonstration, verbal and tactile cues throughout for technique. Pulleys - flexion and scaption x 3' each - cues to remain in pain free ROM Inclined L shoulder flexion 2 x 10- cues to avoid painful ROM Inclined L shoulder scaption 2 x 10 - cues to avoid painful ROM  NEUROMUSCULAR RE-EDUCATION: To improve coordination, kinesthesia, posture, proprioception, and amplitude of movement. Seated bent over prone scapular retraction with (cues to avoid shoulder shrug and avoid extension ROM beyond neutral): Row to neutral 2 x 10 Shoulder extension to neutral 2 x 10 L shoulder rhythmic stabilization at 90 flexion x 30 sec with 2-finger perturbation at elbow, 2 x 30 sec with 2-finger perturbation at distal forearm  Supine L shoulder CW/CCW small circles at 90 flexion  2 x 10 each direction (cues to keep circular motion small amplitude) L shoulder  YTB reactive isometrics step-outs - neutral shoulder with elbow at 90: Flexion x 10 IR x 10 ER x 5 10/05/2023 THERAPEUTIC ACTIVITIES: To improve functional performance with overhead reach.  Demonstration, verbal and tactile cues throughout for technique. Pulleys - flexion and scaption x 3' each - cues to remain in pain free ROM  NEUROMUSCULAR RE-EDUCATION: To improve coordination, kinesthesia, posture, proprioception, and amplitude of movement. Seated bent over prone scapular retraction with (cues to avoid shoulder shrug and avoid extension ROM beyond neutral): Row to neutral 2 x 10 Shoulder extension to neutral 2 x 10 L shoulder rhythmic stabilization at 90 flexion x 30 sec with 2-finger perturbation at elbow, 2 x 30 sec with 2-finger perturbation at distal forearm Supine L shoulder CW/CCW small circles at 90 flexion 2 x 10 each direction (cues to keep circular motion small amplitude) L shoulder YTB reactive isometrics step-outs - neutral shoulder with elbow at 90: Flexion x 10 IR x 10 ER x 5   10/03/2023  THERAPEUTIC ACTIVITY: to improve ability to reach overhead Pulleys - flexion and scaption x 3' each - cues to remain in pain free ROM Supine inclined shoulder AAROM flexion with wand x 10 Supine inclined shoulder AROM flexion 2 x 10 Supine inclined shoulder AROM scaption with wand 2 x 10  NEUROMUSCULAR RE-EDUCATION: To improve coordination, kinesthesia, posture, and proprioception. Standing YTB row 3 x 10 (VC & TC to avoid shoulder shrug) Standing YTB shoulder extension to neutral 3 x 10 Prone scapular retraction with (cues to avoid shoulder shrug): Row to neutral 3 x 10 Shoulder extension to neutral 3 x 10  09/25/2023  THERAPEUTIC EXERCISE: To improve strength, endurance, ROM, and flexibility.  Demonstration, verbal and tactile cues throughout for technique. Pulleys - flexion and scaption x 3' each - cues to remain in pain free ROM Supine L shoulder AROM flexion with elbow  flexed (uppercut) 2 x 10 Supine L shoulder AROM scaption with elbow flexed (hitchhiker) 2 x 10  NEUROMUSCULAR RE-EDUCATION: To improve coordination, kinesthesia, posture, and proprioception. Standing YTB scap retraction + B shoulder row 2 x 10 (VC & TC to avoid shoulder shrug) Standing YTB scap retraction + B shoulder extension to neutral 2 x 10 Prone scapular retraction with (cues to avoid shoulder shrug): Row to neutral 2 x 10 Shoulder extension to neutral 2 x 10 L shoulder rhythmic stabilization at 90 flexion x 30 sec with 2-finger perturbation at elbow, 2 x 30 sec with 2-finger perturbation at distal forearm      PATIENT EDUCATION:  Education details: HEP review, HEP update, postural awareness, and reminders for surgical protocol precautions  Person educated: Patient Education method: Explanation, Demonstration, Verbal cues, and MedBridgeGO app updated Education comprehension: verbalized understanding, returned demonstration, verbal cues required, and needs further education  HOME EXERCISE PROGRAM: *Pt using MedBridgeGO app  Access Code: [email or web address] URL: [email or web address] Date: 10/08/2023 Prepared by: Sharunda Salmon  Exercises - Seated Shoulder Flexion AAROM with Pulley Behind  - 1 x daily - 7 x weekly - 3 min hold - Seated Shoulder Scaption AAROM with Pulley at Side  - 1 x daily - 7 x weekly - 3 min hold - Seated Shoulder External Rotation AAROM with Cane and Hand in Neutral (Mirrored)  - 2-3 x daily - 7 x weekly - 2 sets - 10 reps - 3 sec hold - Seated Isometric Shoulder Flexion  - 2 x daily - 7 x  weekly - 2 sets - 5 reps - 5 sec hold - Isometric Shoulder Abduction  - 2 x daily - 7 x weekly - 2 sets - 5 reps - 5 sec hold - Seated Isometric Shoulder External Rotation  - 2 x daily - 7 x weekly - 2 sets - 5 reps - 5 sec hold - Standing Shoulder Row with Anchored Resistance  - 1 x daily - 5 x weekly - 2 sets - 10 reps - 5 sec hold - Shoulder extension with  resistance - Neutral  - 1 x daily - 5 x weekly - 2 sets - 10 reps - 5 sec hold - Shoulder Internal Rotation Reactive Isometrics  - 1 x daily - 7 x weekly - 1 sets - 10 reps - Shoulder external rotation  - 1 x daily - 7 x weekly - 1 sets - 10 reps - Standing Shoulder Flexion Reactive Isometric  - 1 x daily - 7 x weekly - 1 sets - 10 reps - Seated Bent Over Shoulder Row with Dumbbells  - 1 x daily - 5 x weekly - 2 sets - 10 reps - 3 sec hold - Seated Shoulder Extension  - 1 x daily - 5 x weekly - 2 sets - 10 reps - 3 sec hold - Supine Shoulder Circles  - 1 x daily - 5 x weekly - 2 sets - 10 reps - 3 sec hold - Supine Shoulder Flexion AAROM with Dowel  - 1 x daily - 7 x weekly - 2 sets - 10 reps - 3 sec hold   ASSESSMENT:  CLINICAL IMPRESSION:  Increased standing AROM for L shoulder per protocol. Pt able to complete interventions w/o pain but reports of stretching and fatigue in L shoulder. His ROM is improving, he is limited with ER even after manual stretching only getting 43 degrees. He had some muscle tension in the anterior shoulder today but he reports improvement after MT.  PT remains necessary for strength, ROM, safety and ADLs.    EVAL: [name] is a 78 y.o. male who was referred to physical therapy for evaluation and treatment s/p L reverse TSA on 08/07/2023.  Patient reports nerve block wore off on POD #2 but pain has been mild (</= 3/10) since. Pain is worse with with perturbation to L UE.  Patient has deficits in L shoulder ROM and flexibility, B shoulder and postural strength, and abnormal posture which are interfering with ADLs and are impacting quality of life.  On QuickDASH patient scored 68.2/100 demonstrating 68.2% disability.  Potential for possible skin breakdown observed in L cubital fossa from moisture trapped while being in sling, therefore pt encouraged to spend a little more time out of the sling while seated to allow for air circulation to skin.  Arlo will benefit from  skilled PT to address above deficits to improve mobility and activity tolerance with decreased pain interference.  OBJECTIVE IMPAIRMENTS: decreased activity tolerance, decreased knowledge of condition, decreased mobility, decreased ROM, decreased strength, increased fascial restrictions, impaired perceived functional ability, increased muscle spasms, impaired flexibility, improper body mechanics, postural dysfunction, and pain.   ACTIVITY LIMITATIONS: carrying, lifting, sleeping, bed mobility, bathing, toileting, dressing, self feeding, reach over head, and caring for others  PARTICIPATION LIMITATIONS: meal prep, cleaning, laundry, driving, and community activity  PERSONAL FACTORS: Age, Fitness, Past/current experiences, Time since onset of injury/illness/exacerbation, and 3+ comorbidities: Aneurysm of ascending aorta, aortic stenosis, B sensorineural hearing loss - extremely HOH, CAD s/p CABG, cardiomyopathy,  AVR, chronic anticoagulation, DM-II with neuropathy, CVA of MCA, HTN, expressive aphasia, gout, lumbar stenosis s/p lumbar laminectomy and decompression 2023, shoulder OA, CKD, ventral hernia, hernia repair, hip surgery, hx multiple strokes (3-4), prostate cancer  are also affecting patient's functional outcome.   REHAB POTENTIAL: Good  CLINICAL DECISION MAKING: Evolving/moderate complexity  EVALUATION COMPLEXITY: Moderate   GOALS: Goals reviewed with patient? Yes  SHORT TERM GOALS: Target date: 09/26/2023  Patient will be independent with initial HEP to improve outcomes and carryover.  Baseline: Initial HEP provided on eval 08/30/23 - reviewed HEP and reinforced only P/AAROM at this phase of rehab protocol 09/14/23 - reviewed HEP and reinforced only P/AAROM at this phase of rehab protocol Goal status: MET - 09/20/23  2.  Patient will report 25% improvement in L shoulder pain to improve QOL.   Baseline: up to 3/10 Goal status: MET - 09/14/23 - 85% improved   3.  Patient will improve  L shoulder flexion and scaption to >/=90 to improve functional use of L UE. Baseline: Refer to above UE ROM table Goal status: MET - 09/06/23 see ROM table  LONG TERM GOALS: Target date: 11/07/2023  Patient will be independent with ongoing/advanced HEP for self-management at home.  Baseline:  09/25/23 - HEP updated today Goal status: IN PROGRESS - 10/05/23 - HEP updated today  2.  Patient will report 50-75% improvement in L shoulder pain to improve QOL.  Baseline: up to 3/10 Goal status: MET - 09/14/23 - 85% improved   3.  Patient to demonstrate improved upright posture with posterior shoulder girdle engaged to promote improved glenohumeral joint mobility. Baseline:  Goal status: progressing 10/12/23  4.  Patient to improve L shoulder AROM to [facility] without pain provocation to allow for increased ease of ADLs.  Baseline: Refer to above UE ROM table Goal status: IN PROGRESS - 10/03/23 - L shoulder flexion and scaption/abduction nearing expected outcomes status post reverse TSA  5.  Patient will demonstrate improved L shoulder strength to >/= 4/5 for functional UE use. Baseline: Refer to above UE MMT table Goal status: INITIAL  6  Patient will report </= 50% on QuickDASH  to demonstrate improved functional ability.  Baseline: 68.2 / 100 = 68.2 % Goal status: INITIAL    PLAN:  PT FREQUENCY: 1-2x/week - 1x/wk for rehab phase I (1-4 wks), 1-2x/wk thereafter  PT DURATION: 12 weeks  PLANNED INTERVENTIONS: 02835- PT Re-evaluation, 97750- Physical Performance Testing, 97110-Therapeutic exercises, 97530- Therapeutic activity, [id number]- Neuromuscular re-education, 97535- Self Care, 02859- Manual therapy, G0283- Electrical stimulation (unattended), 97016- Vasopneumatic device, [id number]- Ultrasound, 02966- Ionotophoresis 4mg /ml Dexamethasone , Patient/Family education, Taping, Joint mobilization, Cryotherapy, and Moist heat  PLAN FOR NEXT SESSION: review isometric step outs: Gradually progress into L reverse  TSA protocol phase IV (10-12 wks) - post-op week #10 as of 10/16/23 (surgery 08/07/23)  PRECAUTIONS: Shoulder -  Avoid resisted IR/backward extension until 3 months post-op to protect subscapularis repair Progress patient in scapular plane from PROM to AAROM to AROM ensuring proper kinematics throughout care to avoid over stress to deltoid/deltoid attachments    Sol LITTIE Gaskins, PTA 10/17/2023, 12:00 PM

## 2023-10-18 ENCOUNTER — Ambulatory Visit
Admission: EM | Admit: 2023-10-18 | Discharge: 2023-10-18 | Disposition: A | Discharge status: Home / Self Care | Attending: Family Medicine | Admitting: Family Medicine

## 2023-10-18 ENCOUNTER — Encounter: Admitting: Physical Therapy

## 2023-10-18 ENCOUNTER — Other Ambulatory Visit (HOSPITAL_BASED_OUTPATIENT_CLINIC_OR_DEPARTMENT_OTHER): Payer: Self-pay

## 2023-10-18 ENCOUNTER — Telehealth: Payer: Self-pay

## 2023-10-18 ENCOUNTER — Other Ambulatory Visit: Payer: Self-pay | Admitting: Cardiology

## 2023-10-18 DIAGNOSIS — M109 Gout, unspecified: Secondary | ICD-10-CM | POA: Diagnosis not present

## 2023-10-18 DIAGNOSIS — E785 Hyperlipidemia, unspecified: Secondary | ICD-10-CM

## 2023-10-18 MED ORDER — PREDNISONE 10 MG PO TABS
30.0000 mg | ORAL_TABLET | Freq: Every day | ORAL | 0 refills | Status: AC
  Filled 2023-10-18: qty 15, 5d supply, fill #0

## 2023-10-18 NOTE — Telephone Encounter (Signed)
 Pt seen at [facility].

## 2023-10-18 NOTE — Telephone Encounter (Signed)
 Initial Comment Caller states he has Bursitis on his right elbow and he has arm pain. GOTO Facility Not Listed [facility] Translation No Nurse Assessment Nurse: Gaither, RN, Jennett Date/Time Titus Time): 10/17/2023 10:50:38 PM Confirm and document reason for call. If symptomatic, describe symptoms. ---Caller states that this afternoon he got a pain in his right elbow. He has a bump on the elbow and he thinks it is bursitis. Hasn't eaten anything new. Worried that it might be gout. Hasn't had a flare up in 20 years. Just had his left shoulder replaced and this pain is worse. Does the patient have any new or worsening symptoms? ---Yes Will a triage be completed? ---Yes Related visit to physician within the last 2 weeks? ---No Does the PT have any chronic conditions? (i.e. diabetes, asthma, this includes High risk factors for pregnancy, etc.) ---Yes List chronic conditions. ---Diabetes, HTN, artificial valve Is this a behavioral health or substance abuse call? ---No Guidelines Guideline Title Affirmed Question Affirmed Notes Nurse Date/Time Titus Time) Elbow Swelling [1] SEVERE pain (e.g., excruciating, unable to use hand or wrist at all) AND [2] not improved after 2 hours of pain medicine Gaither, RN, Jennett 10/17/2023 10:54:57 PM PLEASE NOTE: All timestamps contained within this report are represented as [place] Standard Time. CONFIDENTIALTY NOTICE: This fax transmission is intended only for the addressee. It contains information that is legally privileged, confidential or otherwise protected from use or disclosure. If you are not the intended recipient, you are strictly prohibited from reviewing, disclosing, copying using or disseminating any of this information or taking any action in reliance on or regarding this information. If you have received this fax in error, please notify us  immediately by telephone so that we can arrange for its return to us .  Phone: [phone], Toll-Free: [phone], Fax: [phone] [email or web address] 1945/07/29 Page: 1 of2 CallId: [id number] Disp. Time Titus Time) Disposition Final User 10/17/2023 11:00:39 PM See HCP (or PCP Triage) Within 4 Hours Yes Gaither, RN, Jennett Final Disposition 10/17/2023 11:00:39 PM See HCP (or PCP Triage) Within 4 Hours Yes Gaither, RN, Jennett Caller Disagree/Comply Disagree Caller Understands Yes PreDisposition Did not know what to do Care Advice Given Per Guideline * IF OFFICE WILL BE OPEN: You need to be seen within the next 3 or 4 hours. Call your doctor (or NP/PA) now or as soon as the office opens. * Use nurse judgment to select the most appropriate source of care. * UCC: Some UCCs can manage patients who are stable and have less serious symptoms (e.g., minor illnesses and injuries). The triager must know the [facility] capabilities before sending a patient there. If unsure, call ahead. * They are over-the-counter (OTC) pain drugs. You can buy them at the drugstore. PAIN MEDICINES: * For pain relief, you can take either acetaminophen , ibuprofen, or naproxen. CALL BACK IF: * You become worse CARE ADVICE given per Elbow Swelling (Adult) guideline. * Consider both the urgency of the patient's symptoms AND what resources may be needed to evaluate and manage the patient. Comments User: Jennett Gaither, RN Date/Time Titus Time): 10/17/2023 11:00:35 PM Patient stated he is going to sleep now because he and his wife are traveling 8 hours away tomorrow so he will go to [facility] when they open in the morning at 8 am. Referrals GO TO FACILITY OTHER - SPECIFY

## 2023-10-18 NOTE — ED Triage Notes (Signed)
 Pt reports pain and swelling in the right elbow after eating shrimp yesterday. Tylenol  gives no relief.   Per pt family, pt needs to contact the Coumadin  clinic as he needs to be check as meds interact with the Coumadin .

## 2023-10-18 NOTE — ED Provider Notes (Signed)
 [facility]  Note:  This document was prepared using Conservation officer, historic buildings and may include unintentional dictation errors.  MRN: [id number] DOB: 09-May-1945  Subjective:   [name] is a 78 y.o. male presenting for 1 day history of recurrent gout attack of the right elbow.  Has had redness, warmth, swelling.  Symptoms started after he ate shrimp.  Has taken Tylenol  without relief.  Is previously used colchicine  and prednisone  but likes prednisone  better.  Has a history of type 2 diabetes, reports good blood sugar control.  Has a history of heart disease, atrial fibrillation.  Is on Coumadin .  No current facility-administered medications for this encounter.  Current Outpatient Medications:    AMBULATORY NON FORMULARY MEDICATION, cpap cushions            AirFit F20 (Size: Large)           AirFit F30 (Size: Med)   Please FAX the prescription to:  [phone], Disp: 12 each, Rfl: prn   aspirin  EC 81 MG tablet, Take 1 tablet (81 mg total) by mouth daily. Swallow whole., Disp: 30 tablet, Rfl: 12   BD PEN NEEDLE NANO 2ND GEN 32G X 4 MM MISC, 1 DEVICE BY DOES NOT APPLY ROUTE IN THE MORNING, AT NOON, IN THE EVENING, AND AT BEDTIME., Disp: 400 each, Rfl: 3   Blood Glucose Monitoring Suppl Supplies MISC, Use for monitoring glucose level, Disp: 100 each, Rfl: 1   chlorhexidine  (HIBICLENS ) 4 % external liquid, Apply 15 mLs (1 Application total) topically as directed for 30 doses. Use as directed daily for 5 days every other week for 6 weeks., Disp: 946 mL, Rfl: 1   Continuous Blood Gluc Sensor (DEXCOM G6 SENSOR) MISC, 1 Device by Does not apply route as directed., Disp: 9 each, Rfl: 3   Continuous Glucose Receiver (DEXCOM G7 RECEIVER) DEVI, 1 Device by Does not apply route continuous., Disp: , Rfl:    enoxaparin  (LOVENOX ) 100 MG/ML injection, Inject 1 mL (100 mg total) into the skin every 12 (twelve) hours. (Patient not taking: Reported on 08/15/2023), Disp: 20  mL, Rfl: 1   FARXIGA  10 MG TABS tablet, Take 1 tablet (10 mg total) by mouth daily before breakfast., Disp: 90 tablet, Rfl: 3   fenofibrate  (TRICOR ) 145 MG tablet, TAKE 1 TABLET BY MOUTH EVERY DAY, Disp: 90 tablet, Rfl: 1   furosemide  (LASIX ) 40 MG tablet, TAKE 1 TABLET BY MOUTH EVERY DAY, Disp: 90 tablet, Rfl: 1   glucose blood (ONETOUCH VERIO) test strip, Use as instructed, Disp: 100 strip, Rfl: 12   insulin  aspart (NOVOLOG  FLEXPEN) 100 UNIT/ML FlexPen, Max daily 75 units (Patient taking differently: Inject 14-19 Units into the skin See admin instructions. Inject 16 units into the skin with breakfast, 14 units with lunch, and 16 units with supper/evening meal- may increase to up to 19 units per dose as needed for high blood sugar), Disp: 75 mL, Rfl: 3   Insulin  Glargine (BASAGLAR  KWIKPEN) 100 UNIT/ML, INJECT 22 UNITS INTO THE SKIN DAILY. (Patient taking differently: Inject 16 Units into the skin every evening.), Disp: 45 mL, Rfl: 0   isosorbide  mononitrate (IMDUR ) 30 MG 24 hr tablet, TAKE 1 TABLET BY MOUTH EVERY DAY, Disp: 90 tablet, Rfl: 3   ketoconazole (NIZORAL) 2 % cream, Apply 1 Application topically daily as needed for irritation., Disp: , Rfl:    methocarbamol  (ROBAXIN ) 500 MG tablet, Take 1 tablet (500 mg total) by mouth every 8 (eight) hours as needed  for muscle spasms., Disp: 30 tablet, Rfl: 0   metoprolol  succinate (TOPROL -XL) 25 MG 24 hr tablet, TAKE 1 TABLET BY MOUTH EVERY DAY, Disp: 90 tablet, Rfl: 1   ondansetron  (ZOFRAN ) 4 MG tablet, Take 1 tablet (4 mg total) by mouth every 8 (eight) hours as needed for nausea or vomiting., Disp: 10 tablet, Rfl: 0   OneTouch Delica Lancets 33G MISC, USE AS DIRECTED, Disp: 100 each, Rfl: 1   oxyCODONE  (ROXICODONE ) 5 MG immediate release tablet, Take 1 tablet (5 mg total) by mouth every 4 (four) hours as needed for severe pain (pain score 7-10)., Disp: 30 tablet, Rfl: 0   PRESCRIPTION MEDICATION, CPAP- At bedtime, Disp: , Rfl:    rosuvastatin   (CRESTOR ) 40 MG tablet, TAKE 1 TABLET BY MOUTH EVERY DAY, Disp: 90 tablet, Rfl: 3   Semaglutide , 2 MG/DOSE, (OZEMPIC , 2 MG/DOSE,) 8 MG/3ML SOPN, INJECT 2 MG AS DIRECTED ONCE A WEEK., Disp: 3 mL, Rfl: 3   sennosides-docusate sodium  (SENOKOT-S) 8.6-50 MG tablet, Take 2 tablets by mouth daily. (Patient not taking: Reported on 08/15/2023), Disp: 30 tablet, Rfl: 1   tacrolimus  (PROTOPIC ) 0.1 % ointment, Apply 1 application  topically 2 (two) times daily as needed (for eczema)., Disp: , Rfl:    warfarin (COUMADIN ) 7.5 MG tablet, TAKE 1/2 TABLET TO 1 TABLET BY MOUTH DAILY AS DIRECTED BY THE COUMADIN  CLINIC (Patient taking differently: Take 3.75-7.5 mg by mouth See admin instructions. Take 0.5 tablet (3.75 mg) by mouth on Mondays, Wednesdays & Fridays.  Take 1 tablet (7.5 mg) by mouth on Sundays, Tuesdays, Thursdays & Saturdays.), Disp: 85 tablet, Rfl: 0   No Known Allergies  Past Medical History:  Diagnosis Date   Actinic keratosis 02/14/2011   Allergy    Aneurysm of ascending aorta without rupture (HCC) 01/06/2022   Aortic stenosis    Arthritis    Bilateral sensorineural hearing loss 06/16/2009   CAD (coronary artery disease)    cabg   Complication of anesthesia    Coronary atherosclerosis of native coronary artery 06/15/2009   Diabetic polyneuropathy 11/11/2018   Dyslipidemia 11/05/2018   Embolic stroke involving left middle cerebral artery  05/30/2021   Erectile dysfunction 05/18/2009   Essential hypertension 06/16/2009   Expressive aphasia 06/25/2021   External hemorrhoids 03/21/2010   Gout 04/09/2015   Heart murmur    Hemochromatosis 02/14/2011   History of aortic valve replacement 10/13/2009   History of CABG 11/05/2018   2005- 1. Coronary artery bypass grafting x4 with LIMA-LAD, SVG-OM1, SVG-AM and dCFX.  2. Aortic valve replacement with [employer] Jude AVR 3. Septal myomectomy.  4. Myoview low risk 2016   History of hepatitis 01/31/1972   Hypertriglyceridemia 06/13/2010   Hypertrophic  obstructive cardiomyopathy 05/06/2008   SP septal myomectomy at surgery 2005 Echo Nov 2018- Hyperdynamic LVEF with severe basal septal hypertrophy. There is chordal SAM with resting gradient of 16 mmHg that increases to 85 mmHg with Valsalva        Leg weakness, bilateral 01/18/2021   Long term (current) use of anticoagulants 05/22/2018   Lumbar stenosis with neurogenic claudication 02/04/2021   Malignant neoplasm of prostate 06/17/2009   Mechanical heart valve present    Manufacturer: Deitra Rosemary Blade #: [id number]  Model #: [id number]. Card states MRI compatible with 3 teslas or less.   Obesity, unspecified 04/29/2008   OSA (obstructive sleep apnea) 07/12/2012   moderate-severe; uses CPAP nightly   PONV (postoperative nausea and vomiting)    after CABG- slow to wake up  Presence of prosthetic heart valve    Primary osteoarthritis of left shoulder 10/31/2018   S/P lumbar laminectomy 02/04/2021   Stage 3 chronic kidney disease due to type 2 diabetes mellitus 11/11/2018   Tobacco use 06/16/2009   Type 2 diabetes mellitus with hyperglycemia    Ventral hernia 03/03/2014     Past Surgical History:  Procedure Laterality Date   AORTIC VALVE REPLACEMENT  11/14/2003   [facility]   APPENDECTOMY  1990   CHOLECYSTECTOMY  1990   CORONARY ANGIOGRAPHY  10/11/2021   CORONARY ARTERY BYPASS GRAFT  10/2003   HERNIA REPAIR  1999   right, inguinal   HERNIA REPAIR  2002   left, inguinal   HIP SURGERY  2006   right hip   IR CT HEAD LTD  02/03/2022   IR PERCUTANEOUS ART THROMBECTOMY/INFUSION INTRACRANIAL INC DIAG ANGIO  01/24/2022   LUMBAR LAMINECTOMY/DECOMPRESSION MICRODISCECTOMY Bilateral 02/04/2021   Procedure: Bilateral Lumbar Two-Three Laminectomy;  Surgeon: Colon Shove, MD;  Location: [facility] OR;  Service: Neurosurgery;  Laterality: Bilateral;  3C/RM 20   PILONIDAL CYST EXCISION  1964   prostate seed implant  03/2010   RADIOLOGY WITH ANESTHESIA N/A 01/24/2022   Procedure: IR WITH ANESTHESIA;   Surgeon: Radiologist, Medication, MD;  Location: [facility] OR;  Service: Radiology;  Laterality: N/A;   REVERSE SHOULDER ARTHROPLASTY Left 08/07/2023   Procedure: ARTHROPLASTY, SHOULDER, TOTAL, REVERSE;  Surgeon: Josefina Chew, MD;  Location: [facility] ORS;  Service: Orthopedics;  Laterality: Left;   TEE WITHOUT CARDIOVERSION N/A 03/14/2022   Procedure: TRANSESOPHAGEAL ECHOCARDIOGRAM (TEE);  Surgeon: Loni Soyla LABOR, MD;  Location: [facility] ENDOSCOPY;  Service: Cardiology;  Laterality: N/A;   TONSILLECTOMY  childhood    Family History  Problem Relation Age of Onset   Heart disease Mother    Stroke Mother    Heart disease Father    Asperger's syndrome Son    Hyperlipidemia Son    Coronary artery disease Brother    Cancer Neg Hx        negative for colon cancer    Social History   Tobacco Use   Smoking status: Former    Types: Cigars    Quit date: 06/2021    Years since quitting: 2.3   Smokeless tobacco: Never   Tobacco comments:    Pt states he smokes 1 cigar about once a month. 06/01/2021    Pt quit  Cigars 06/2021  Vaping Use   Vaping status: Never Used  Substance Use Topics   Alcohol use: Not Currently    Comment: wine maybe 1 per month   Drug use: No    ROS   Objective:   Vitals: BP 126/74 (BP Location: Left Arm)   Pulse (!) 58   Temp 98.2 F (36.8 C) (Oral)   Resp 16   SpO2 97%   Physical Exam Constitutional:      General: He is not in acute distress.    Appearance: Normal appearance. He is well-developed and normal weight. He is not ill-appearing, toxic-appearing or diaphoretic.  HENT:     Head: Normocephalic and atraumatic.     Right Ear: External ear normal.     Left Ear: External ear normal.     Nose: Nose normal.     Mouth/Throat:     Pharynx: Oropharynx is clear.  Eyes:     General: No scleral icterus.       Right eye: No discharge.        Left eye: No discharge.  Extraocular Movements: Extraocular movements intact.  Cardiovascular:     Rate and Rhythm:  Normal rate.  Pulmonary:     Effort: Pulmonary effort is normal.  Musculoskeletal:     Cervical back: Normal range of motion.     Comments: 1+ swelling with warmth and erythema, decreased range of motion at the right elbow.  Neurological:     Mental Status: He is alert and oriented to person, place, and time.  Psychiatric:        Mood and Affect: Mood normal.        Behavior: Behavior normal.        Thought Content: Thought content normal.        Judgment: Judgment normal.     Assessment and Plan :   PDMP not reviewed this encounter.  1. Acute gout of right elbow, unspecified cause    Will use prednisone  30 mg for gout attack of the right elbow.  Advised low purine diet.  Emphasized need for follow-up with his Coumadin  clinic due to interactions between prednisone  and Coumadin .  Counseled patient on potential for adverse effects with medications prescribed/recommended today, ER and return-to-clinic precautions discussed, patient verbalized understanding.    Christopher Savannah, [place] 10/18/23 [phone]

## 2023-10-22 ENCOUNTER — Encounter

## 2023-10-24 ENCOUNTER — Other Ambulatory Visit: Payer: Self-pay

## 2023-10-24 ENCOUNTER — Ambulatory Visit: Admitting: Physical Therapy

## 2023-10-24 ENCOUNTER — Encounter: Payer: Self-pay | Admitting: Physical Therapy

## 2023-10-24 DIAGNOSIS — M6281 Muscle weakness (generalized): Secondary | ICD-10-CM | POA: Diagnosis not present

## 2023-10-24 DIAGNOSIS — R2681 Unsteadiness on feet: Secondary | ICD-10-CM | POA: Diagnosis not present

## 2023-10-24 DIAGNOSIS — M25612 Stiffness of left shoulder, not elsewhere classified: Secondary | ICD-10-CM

## 2023-10-24 DIAGNOSIS — R293 Abnormal posture: Secondary | ICD-10-CM

## 2023-10-24 DIAGNOSIS — Z7409 Other reduced mobility: Secondary | ICD-10-CM | POA: Diagnosis not present

## 2023-10-24 DIAGNOSIS — M25512 Pain in left shoulder: Secondary | ICD-10-CM

## 2023-10-24 MED ORDER — DAPAGLIFLOZIN PROPANEDIOL 10 MG PO TABS: 10.0000 mg | ORAL_TABLET | Freq: Every day | ORAL | 0 refills | Status: DC

## 2023-10-24 NOTE — Therapy (Signed)
 OUTPATIENT PHYSICAL THERAPY TREATMENT   Progress Note  Reporting Period 09/14/2023 to 10/24/2023  See note below for Objective Data and Assessment of Progress/Goals.     Patient Name: [name] MRN: [id number] DOB:02/09/1945, 78 y.o., male Today's Date: 10/24/2023   END OF SESSION:  PT End of Session - 10/24/23 1103     Visit Number 14    Date for Recertification  11/07/23    Authorization Type [employer]    Progress Note Due on Visit 24   MD PN for 10/26/23 on visit #14 (10/24/23)   PT Start Time 1103    PT Stop Time 1149    PT Time Calculation (min) 46 min    Activity Tolerance Patient tolerated treatment well    Behavior During Therapy [facility] for tasks assessed/performed                  Past Medical History:  Diagnosis Date   Actinic keratosis 02/14/2011   Allergy    Aneurysm of ascending aorta without rupture 01/06/2022   Aortic stenosis    Arthritis    Bilateral sensorineural hearing loss 06/16/2009   CAD (coronary artery disease)    cabg   Complication of anesthesia    Coronary atherosclerosis of native coronary artery 06/15/2009   Diabetic polyneuropathy 11/11/2018   Dyslipidemia 11/05/2018   Embolic stroke involving left middle cerebral artery  05/30/2021   Erectile dysfunction 05/18/2009   Essential hypertension 06/16/2009   Expressive aphasia 06/25/2021   External hemorrhoids 03/21/2010   Gout 04/09/2015   Heart murmur    Hemochromatosis 02/14/2011   History of aortic valve replacement 10/13/2009   History of CABG 11/05/2018   2005- 1. Coronary artery bypass grafting x4 with LIMA-LAD, SVG-OM1, SVG-AM and dCFX.  2. Aortic valve replacement with [employer] AVR 3. Septal myomectomy.  4. Myoview low risk 2016   History of hepatitis 01/31/1972   Hypertriglyceridemia 06/13/2010   Hypertrophic obstructive cardiomyopathy 05/06/2008   SP septal myomectomy at surgery 2005 Echo Nov 2018- Hyperdynamic LVEF with severe basal septal hypertrophy. There  is chordal SAM with resting gradient of 16 mmHg that increases to 85 mmHg with Valsalva        Leg weakness, bilateral 01/18/2021   Long term (current) use of anticoagulants 05/22/2018   Lumbar stenosis with neurogenic claudication 02/04/2021   Malignant neoplasm of prostate 06/17/2009   Mechanical heart valve present    Manufacturer: Deitra Rosemary Blade #: [id number]  Model #: [id number]. Card states MRI compatible with 3 teslas or less.   Obesity, unspecified 04/29/2008   OSA (obstructive sleep apnea) 07/12/2012   moderate-severe; uses CPAP nightly   PONV (postoperative nausea and vomiting)    after CABG- slow to wake up   Presence of prosthetic heart valve    Primary osteoarthritis of left shoulder 10/31/2018   S/P lumbar laminectomy 02/04/2021   Stage 3 chronic kidney disease due to type 2 diabetes mellitus 11/11/2018   Tobacco use 06/16/2009   Type 2 diabetes mellitus with hyperglycemia    Ventral hernia 03/03/2014   Past Surgical History:  Procedure Laterality Date   AORTIC VALVE REPLACEMENT  11/14/2003   [facility]   APPENDECTOMY  1990   CHOLECYSTECTOMY  1990   CORONARY ANGIOGRAPHY  10/11/2021   CORONARY ARTERY BYPASS GRAFT  10/2003   HERNIA REPAIR  1999   right, inguinal   HERNIA REPAIR  2002   left, inguinal   HIP SURGERY  2006   right  hip   IR CT HEAD LTD  02/03/2022   IR PERCUTANEOUS ART THROMBECTOMY/INFUSION INTRACRANIAL INC DIAG ANGIO  01/24/2022   LUMBAR LAMINECTOMY/DECOMPRESSION MICRODISCECTOMY Bilateral 02/04/2021   Procedure: Bilateral Lumbar Two-Three Laminectomy;  Surgeon: Colon Shove, MD;  Location: [facility] OR;  Service: Neurosurgery;  Laterality: Bilateral;  3C/RM 20   PILONIDAL CYST EXCISION  1964   prostate seed implant  03/2010   RADIOLOGY WITH ANESTHESIA N/A 01/24/2022   Procedure: IR WITH ANESTHESIA;  Surgeon: Radiologist, Medication, MD;  Location: [facility] OR;  Service: Radiology;  Laterality: N/A;   REVERSE SHOULDER ARTHROPLASTY Left 08/07/2023   Procedure:  ARTHROPLASTY, SHOULDER, TOTAL, REVERSE;  Surgeon: Josefina Chew, MD;  Location: [facility] ORS;  Service: Orthopedics;  Laterality: Left;   TEE WITHOUT CARDIOVERSION N/A 03/14/2022   Procedure: TRANSESOPHAGEAL ECHOCARDIOGRAM (TEE);  Surgeon: Loni Soyla LABOR, MD;  Location: [facility] ENDOSCOPY;  Service: Cardiology;  Laterality: N/A;   TONSILLECTOMY  childhood   Patient Active Problem List   Diagnosis Date Noted   S/P reverse total shoulder arthroplasty, left 08/07/2023   Pre-op evaluation 01/17/2023   Left shoulder pain 11/17/2022   Hypertrophic cardiomyopathy (HCC) 08/12/2022   Chronic anticoagulation 08/12/2022   Hypotension 04/27/2022   H/O: CVA (cerebrovascular accident) 03/14/2022   Urinary retention 01/26/2022   Acute ischemic stroke (HCC) 01/24/2022   Middle cerebral artery embolism, left 01/24/2022   Aneurysm of ascending aorta without rupture 01/06/2022   Type 2 diabetes mellitus with stage 3a chronic kidney disease, with long-term current use of insulin  (HCC) 10/30/2021   Type 2 diabetes mellitus with diabetic polyneuropathy, with long-term current use of insulin  (HCC) 10/30/2021   NSTEMI (non-ST elevated myocardial infarction) (HCC) 10/09/2021   Paroxysmal atrial fibrillation (HCC) 10/09/2021   Presence of prosthetic heart valve    Type 2 diabetes mellitus with hyperglycemia    Expressive aphasia 06/25/2021   History of CVA (cerebrovascular accident) 05/2021   S/P lumbar laminectomy 02/04/2021   Lumbar stenosis with neurogenic claudication 02/04/2021   History of falling 01/30/2021   Diabetic polyneuropathy 11/11/2018   Stage 3 chronic kidney disease due to type 2 diabetes mellitus 11/11/2018   History of CABG 11/05/2018   Dyslipidemia 11/05/2018   Primary osteoarthritis, left shoulder 10/31/2018   Long term (current) use of anticoagulants 05/22/2018   Ventral hernia 03/03/2014   OSA (obstructive sleep apnea) 07/12/2012   Hemochromatosis 02/14/2011   Actinic keratosis 02/14/2011    Hypertriglyceridemia 06/13/2010   External hemorrhoids 03/21/2010   History of aortic valve replacement 10/13/2009   Malignant neoplasm of prostate (HCC) 06/17/2009   Bilateral sensorineural hearing loss 06/16/2009   Essential hypertension 06/16/2009   Coronary atherosclerosis of native coronary artery 06/15/2009   Erectile dysfunction 05/18/2009   Obesity, unspecified 04/29/2008    PCP: Daryl Setter, NP   REFERRING PROVIDER: Josefina Chew, MD   REFERRING DIAG: [phone] (ICD-10-CM) - Status post reverse total shoulder replacement, left  THERAPY DIAG:  Stiffness of left shoulder, not elsewhere classified  Acute pain of left shoulder  Muscle weakness (generalized)  Abnormal posture  RATIONALE FOR EVALUATION AND TREATMENT: Rehabilitation  ONSET DATE: 08/07/2023 - L reverse TSA  NEXT MD VISIT: 10/26/23   SUBJECTIVE:  SUBJECTIVE STATEMENT:  Pt reports he went to visit his mother-in-law and did not have access to his pulleys.  He reports he was on prednisone  over the weekend due to gout in his R elbow - dose now completed.  PAIN: Are you having pain? Yes: NPRS scale: brief intermittent up to 3-4/10 Pain location: L shoulder  Pain description: ache  Aggravating factors: perturbation to arm  Relieving factors: ice, Tylenol  PRN   PERTINENT HISTORY:  Aneurysm of ascending aorta, aortic stenosis, B sensorineural hearing loss - extremely HOH, CAD s/p CABG, cardiomyopathy, AVR, chronic anticoagulation, DM-II with neuropathy, CVA of MCA, HTN, expressive aphasia, gout, lumbar stenosis s/p lumbar laminectomy and decompression 2023, shoulder OA, CKD, ventral hernia, hernia repair, hip surgery, hx multiple strokes (3-4), prostate cancer   PRECAUTIONS: Shoulder -  Avoid resisted  IR/backward extension until 3 months post-op to protect subscapularis repair Progress patient in scapular plane from PROM to AAROM to AROM ensuring proper kinematics throughout care to avoid over stress to deltoid/deltoid attachments   RED FLAGS: None  HAND DOMINANCE: Right  WEIGHT BEARING RESTRICTIONS: Yes Phase I (1-4 wks): <1 lb; Phase II-IV (4-12 wks): <5 lbs; Phase V (12-16 wks): advance to tolerance; Phase VI (16 wks to 6 mo): Full  FALLS:  Has patient fallen in last 6 months? Yes. Number of falls 1 - fell in garden in May  LIVING ENVIRONMENT:  Lives with: lives with their family Lives in: House/apartment Stairs: No Has following equipment at home: Environmental consultant - 2 wheeled, Crutches, and Grab bars  OCCUPATION: Retired  PLOF: Independent with household mobility without device, Independent with community mobility with device, Needs assistance with ADLs, and Leisure: gardening, 2-3 days/wk at [employer]  PATIENT GOALS: To use normal ROM (for my arm) as much as I can. To not be in pain all the time.   OBJECTIVE: (objective measures completed at initial evaluation unless otherwise dated)  DIAGNOSTIC FINDINGS:  08/07/23 - DG Left Shoulder: CLINICAL DATA:  Status post reverse shoulder arthroplasty.   FINDINGS: Total left shoulder arthroplasty. The arthroplasty components appear intact and in anatomic alignment. There is no acute fracture ordislocation. Postsurgical changes in the soft tissues of the leftshoulder.   IMPRESSION: Total left shoulder arthroplasty.  PATIENT SURVEYS:  Quick Dash: 68.2 / 100 = 68.2 % Minimally Clinically Important Difference (MCID): 15-20 points  COGNITION: Overall cognitive status: Within functional limits for tasks assessed     SENSATION: WFL  POSTURE: rounded shoulders and forward head  UPPER EXTREMITY ROM:   Passive ROM Left eval L 08/30/23 L 09/06/23 L 09/14/23 L 10/03/23  Shoulder flexion 58 90 110 119 120  Shoulder extension        Shoulder abduction 50 scaption 90 scaption 105 117 scaption 128  Shoulder adduction       Shoulder internal rotation       Shoulder external rotation -6 from neutral  24 at ~30 scaption 21 31 at ~30 scaption 30   Active ROM Right  L 10/12/23 standing L 10/17/23 L 10/24/23  Shoulder flexion  108 110 118  Shoulder extension      Shoulder abduction  102 Scaption- 110 111 scaption  Shoulder adduction      Shoulder internal rotation      Shoulder external rotation  40 at 90 degrees abduction  38 at ~30 scaption  (Blank rows = not tested)  UPPER EXTREMITY MMT: (deferred on eval d/t only 1 week postop)  MMT R 10/24/23 L 10/24/23  Shoulder flexion  5 4+  Shoulder extension    Shoulder abduction 5 4+ scaption  Shoulder adduction    Shoulder internal rotation    Shoulder external rotation 4 4-  Middle trapezius    Lower trapezius    (Blank rows = not tested)  JOINT MOBILITY TESTING:  Patient very guarded with initial PROM  PALPATION/OBSERVATION: Bruising draining into upper arm.  Skin at cubital fossa pale and slightly moist/macerated.    TODAY'S TREATMENT:   10/24/23 THERAPEUTIC EXERCISE: To improve strength, endurance, ROM, and flexibility.  Demonstration, verbal and tactile cues throughout for technique.  Pulleys - flexion and scaption x 3' each - cues to remain in pain free ROM Standing L shoulder flexion wall slide x 10 Standing L shoulder scaption wall slide x 10  THERAPEUTIC ACTIVITIES: To improve functional performance.  Demonstration, verbal and tactile cues throughout for technique.  Shoulder ROM assessment UE MMT QuickDASH: 22.7 / 100 = 22.7 % Goal assessment  NEUROMUSCULAR RE-EDUCATION: To improve coordination, kinesthesia, posture, and proprioception.  L shoulder CW/CCW circles with small ball on wall in standing x 15 each direction Standing scap retraction into pool noodle on wall + B shoulder flexion x 10 Standing scap retraction into pool noodle on wall + B  shoulder scaption x 10  MANUAL THERAPY: To promote normalized muscle tension, improved flexibility, improved joint mobility, and increased ROM utilizing connective tissue massage, therapeutic massage, and manual TP therapy.  STM/DTM and manual TPR to L pecs (esp pec minor), infraspinatus and teres group   10/17/23 Pulleys 3 min flexion and scaption All exercises with L UE: Counter slides into flexion x 20, scaption x 20 w/ 1lb weight Counter CW/CCW circles x 20 each way into flexion Wall slide flexion x 10 Serratus punch 3lb x 20 CW/CCW circles supine 3lb x 10 each way STM to L anterior deltoid MWM into shoulder flexion ER PROM supine  Standing AAROM flexion with cane x 10; scaption x 10    10/15/23 THERAPEUTIC EXERCISE: To improve ROM.  Demonstration, verbal and tactile cues throughout for technique. Pulleys flexion x 2'; scaption x 2' Supine PROM L shoulder   THERAPEUTIC ACTIVITIES: To improve functional performance.  Demonstration, verbal and tactile cues throughout for technique. Supine 3# serratus press up x 20 LUE Supine 3# small circles at 90 CW x 10; CCW x 10 with manual PT assist Standing counter dust small circles CW x 20; CCW x 20 within precautionary ROM with PT assisting manually Standing shoulder scaption in  L-bar position with cane x 20 LUE with mirror to prevent shoulder hiking Standing wall dust scaption x 20 LUE Standing ER 1# x 30 LUE with PT manual assist for correct form Standing shoulder rows RTB x 30 BUE w/ PT assist to prevent extension past body Standing shoulder ext RTB x 30 BUE w/ PT assist to prevent extension past body Standing cane ER stretch x 1' x 2   10/12/23 Pulleys Flexion and scaption Reviewed and altered his HEP as he requested many changes Issued red tband Supine 3# punches with PT limiting motion Supine 3# serratus punches Supine 3# isometric circles Supine 2# flexion from 90 degrees to his tolerance Supine 1# ER from belly with elbow  at 60 degrees abduction Right sidelying 1# ER holding elbow at side All above 2x10 Supine PROM, jome gentle joint distraction Measured AROM in standing  10/08/2023 THERAPEUTIC ACTIVITIES: To improve functional performance with overhead reach.  Demonstration, verbal and tactile cues throughout for technique. Pulleys - flexion and  scaption x 3' each - cues to remain in pain free ROM Inclined L shoulder flexion 2 x 10- cues to avoid painful ROM Inclined L shoulder scaption 2 x 10 - cues to avoid painful ROM  NEUROMUSCULAR RE-EDUCATION: To improve coordination, kinesthesia, posture, proprioception, and amplitude of movement. Seated bent over prone scapular retraction with (cues to avoid shoulder shrug and avoid extension ROM beyond neutral): Row to neutral 2 x 10 Shoulder extension to neutral 2 x 10 L shoulder rhythmic stabilization at 90 flexion x 30 sec with 2-finger perturbation at elbow, 2 x 30 sec with 2-finger perturbation at distal forearm  Supine L shoulder CW/CCW small circles at 90 flexion 2 x 10 each direction (cues to keep circular motion small amplitude) L shoulder YTB reactive isometrics step-outs - neutral shoulder with elbow at 90: Flexion x 10 IR x 10 ER x 5   10/05/2023 THERAPEUTIC ACTIVITIES: To improve functional performance with overhead reach.  Demonstration, verbal and tactile cues throughout for technique. Pulleys - flexion and scaption x 3' each - cues to remain in pain free ROM  NEUROMUSCULAR RE-EDUCATION: To improve coordination, kinesthesia, posture, proprioception, and amplitude of movement. Seated bent over prone scapular retraction with (cues to avoid shoulder shrug and avoid extension ROM beyond neutral): Row to neutral 2 x 10 Shoulder extension to neutral 2 x 10 L shoulder rhythmic stabilization at 90 flexion x 30 sec with 2-finger perturbation at elbow, 2 x 30 sec with 2-finger perturbation at distal forearm Supine L shoulder CW/CCW small circles at 90  flexion 2 x 10 each direction (cues to keep circular motion small amplitude) L shoulder YTB reactive isometrics step-outs - neutral shoulder with elbow at 90: Flexion x 10 IR x 10 ER x 5   10/03/2023  THERAPEUTIC ACTIVITY: to improve ability to reach overhead Pulleys - flexion and scaption x 3' each - cues to remain in pain free ROM Supine inclined shoulder AAROM flexion with wand x 10 Supine inclined shoulder AROM flexion 2 x 10 Supine inclined shoulder AROM scaption with wand 2 x 10  NEUROMUSCULAR RE-EDUCATION: To improve coordination, kinesthesia, posture, and proprioception. Standing YTB row 3 x 10 (VC & TC to avoid shoulder shrug) Standing YTB shoulder extension to neutral 3 x 10 Prone scapular retraction with (cues to avoid shoulder shrug): Row to neutral 3 x 10 Shoulder extension to neutral 3 x 10   09/25/2023  THERAPEUTIC EXERCISE: To improve strength, endurance, ROM, and flexibility.  Demonstration, verbal and tactile cues throughout for technique. Pulleys - flexion and scaption x 3' each - cues to remain in pain free ROM Supine L shoulder AROM flexion with elbow flexed (uppercut) 2 x 10 Supine L shoulder AROM scaption with elbow flexed (hitchhiker) 2 x 10  NEUROMUSCULAR RE-EDUCATION: To improve coordination, kinesthesia, posture, and proprioception. Standing YTB scap retraction + B shoulder row 2 x 10 (VC & TC to avoid shoulder shrug) Standing YTB scap retraction + B shoulder extension to neutral 2 x 10 Prone scapular retraction with (cues to avoid shoulder shrug): Row to neutral 2 x 10 Shoulder extension to neutral 2 x 10 L shoulder rhythmic stabilization at 90 flexion x 30 sec with 2-finger perturbation at elbow, 2 x 30 sec with 2-finger perturbation at distal forearm   PATIENT EDUCATION:  Education details: progress with PT, ongoing PT POC, postural awareness, and reminders for surgical protocol precautions  Person educated: Patient Education method: Explanation,  Demonstration, and Verbal cues Education comprehension: verbalized understanding, returned demonstration, verbal cues  required, and needs further education  HOME EXERCISE PROGRAM: *Pt using MedBridgeGO app  Access Code: [email or web address] URL: [email or web address] Date: 10/08/2023 Prepared by: Sol Gaskins  Exercises - Seated Shoulder Flexion AAROM with Pulley Behind  - 1 x daily - 7 x weekly - 3 min hold - Seated Shoulder Scaption AAROM with Pulley at Side  - 1 x daily - 7 x weekly - 3 min hold - Seated Shoulder External Rotation AAROM with Cane and Hand in Neutral (Mirrored)  - 2-3 x daily - 7 x weekly - 2 sets - 10 reps - 3 sec hold - Seated Isometric Shoulder Flexion  - 2 x daily - 7 x weekly - 2 sets - 5 reps - 5 sec hold - Isometric Shoulder Abduction  - 2 x daily - 7 x weekly - 2 sets - 5 reps - 5 sec hold - Seated Isometric Shoulder External Rotation  - 2 x daily - 7 x weekly - 2 sets - 5 reps - 5 sec hold - Standing Shoulder Row with Anchored Resistance  - 1 x daily - 5 x weekly - 2 sets - 10 reps - 5 sec hold - Shoulder extension with resistance - Neutral  - 1 x daily - 5 x weekly - 2 sets - 10 reps - 5 sec hold - Shoulder Internal Rotation Reactive Isometrics  - 1 x daily - 7 x weekly - 1 sets - 10 reps - Shoulder external rotation  - 1 x daily - 7 x weekly - 1 sets - 10 reps - Standing Shoulder Flexion Reactive Isometric  - 1 x daily - 7 x weekly - 1 sets - 10 reps - Seated Bent Over Shoulder Row with Dumbbells  - 1 x daily - 5 x weekly - 2 sets - 10 reps - 3 sec hold - Seated Shoulder Extension  - 1 x daily - 5 x weekly - 2 sets - 10 reps - 3 sec hold - Supine Shoulder Circles  - 1 x daily - 5 x weekly - 2 sets - 10 reps - 3 sec hold - Supine Shoulder Flexion AAROM with Dowel  - 1 x daily - 7 x weekly - 2 sets - 10 reps - 3 sec hold   ASSESSMENT:  CLINICAL IMPRESSION:  Saksham continues to gradually improve with L shoulder ROM and strength but remains limited with  functional activities due to fatigue.  Some ROM restrictions appear to be the result of ongoing increased muscle tension in the L shoulder complex with MT via STM and manual TPR resulting in improving ROM.  Cues necessary to avoid shoulder hiking with overhead ROM exercises/activities.  He is nearing the end of his current POC and may be ready to transition to his HEP at that time but will await outcome of his next post-op f/u with the surgeon on 10/26/23.   EVAL: JAVARIE CRISP is a 78 y.o. male who was referred to physical therapy for evaluation and treatment s/p L reverse TSA on 08/07/2023.  Patient reports nerve block wore off on POD #2 but pain has been mild (</= 3/10) since. Pain is worse with with perturbation to L UE.  Patient has deficits in L shoulder ROM and flexibility, B shoulder and postural strength, and abnormal posture which are interfering with ADLs and are impacting quality of life.  On QuickDASH patient scored 68.2/100 demonstrating 68.2% disability.  Potential for possible skin breakdown observed in L cubital fossa from moisture trapped while being in  sling, therefore pt encouraged to spend a little more time out of the sling while seated to allow for air circulation to skin.  Yordin will benefit from skilled PT to address above deficits to improve mobility and activity tolerance with decreased pain interference.  OBJECTIVE IMPAIRMENTS: decreased activity tolerance, decreased knowledge of condition, decreased mobility, decreased ROM, decreased strength, increased fascial restrictions, impaired perceived functional ability, increased muscle spasms, impaired flexibility, improper body mechanics, postural dysfunction, and pain.   ACTIVITY LIMITATIONS: carrying, lifting, sleeping, bed mobility, bathing, toileting, dressing, self feeding, reach over head, and caring for others  PARTICIPATION LIMITATIONS: meal prep, cleaning, laundry, driving, and community activity  PERSONAL FACTORS: Age,  Fitness, Past/current experiences, Time since onset of injury/illness/exacerbation, and 3+ comorbidities: Aneurysm of ascending aorta, aortic stenosis, B sensorineural hearing loss - extremely HOH, CAD s/p CABG, cardiomyopathy, AVR, chronic anticoagulation, DM-II with neuropathy, CVA of MCA, HTN, expressive aphasia, gout, lumbar stenosis s/p lumbar laminectomy and decompression 2023, shoulder OA, CKD, ventral hernia, hernia repair, hip surgery, hx multiple strokes (3-4), prostate cancer  are also affecting patient's functional outcome.   REHAB POTENTIAL: Good  CLINICAL DECISION MAKING: Evolving/moderate complexity  EVALUATION COMPLEXITY: Moderate   GOALS: Goals reviewed with patient? Yes  SHORT TERM GOALS: Target date: 09/26/2023  Patient will be independent with initial HEP to improve outcomes and carryover.  Baseline: Initial HEP provided on eval 08/30/23 - reviewed HEP and reinforced only P/AAROM at this phase of rehab protocol 09/14/23 - reviewed HEP and reinforced only P/AAROM at this phase of rehab protocol Goal status: MET - 09/20/23  2.  Patient will report 25% improvement in L shoulder pain to improve QOL.   Baseline: up to 3/10 Goal status: MET - 09/14/23 - 85% improved   3.  Patient will improve L shoulder flexion and scaption to >/=90 to improve functional use of L UE. Baseline: Refer to above UE ROM table Goal status: MET - 09/06/23 see ROM table  LONG TERM GOALS: Target date: 11/07/2023  Patient will be independent with ongoing/advanced HEP for self-management at home.  Baseline:  09/25/23 - HEP updated today 10/05/23 - HEP updated today Goal status: IN PROGRESS - 10/24/23 - continue with current HEP  2.  Patient will report 50-75% improvement in L shoulder pain to improve QOL.  Baseline: up to 3/10 Goal status: MET - 09/14/23 - 85% improved   3.  Patient to demonstrate improved upright posture with posterior shoulder girdle engaged to promote improved glenohumeral joint  mobility. Baseline:  Goal status: IN PROGRESS - 10/24/23   4.  Patient to improve L shoulder AROM to [facility] without pain provocation to allow for increased ease of ADLs.  Baseline: Refer to above UE ROM table 10/03/23 - L shoulder flexion and scaption/abduction nearing expected outcomes status post reverse TSA Goal status: IN PROGRESS - 10/24/23  5.  Patient will demonstrate improved L shoulder strength to >/= 4/5 for functional UE use. Baseline: Refer to above UE MMT table Goal status: PARTIALLY MET - 10/24/23 - Met for flexion & scaption, ER 4-/5  6  Patient will report </= 50% on QuickDASH  to demonstrate improved functional ability.  Baseline: 68.2 / 100 = 68.2 % Goal status: MET - 10/24/23 - 22.7 / 100 = 22.7 %   PLAN:  PT FREQUENCY: 1-2x/week - 1x/wk for rehab phase I (1-4 wks), 1-2x/wk thereafter  PT DURATION: 12 weeks  PLANNED INTERVENTIONS: [id number]- PT Re-evaluation, 97750- Physical Performance Testing, 97110-Therapeutic exercises, 97530- Therapeutic activity, [id number]- Neuromuscular re-education,  02464- Self Care, [id number]- Manual therapy, [id number]- Electrical stimulation (unattended), 97016- Vasopneumatic device, [id number]- Ultrasound, [id number]- Ionotophoresis 4mg /ml Dexamethasone , Patient/Family education, Taping, Joint mobilization, Cryotherapy, and Moist heat  PLAN FOR NEXT SESSION: review isometric step outs: Gradually progress into L reverse TSA protocol phase IV (10-12 wks) - post-op week #11 as of 10/23/23 (surgery 08/07/23)  PRECAUTIONS: Shoulder -  Avoid resisted IR/backward extension until 3 months post-op to protect subscapularis repair Progress patient in scapular plane from PROM to AAROM to AROM ensuring proper kinematics throughout care to avoid over stress to deltoid/deltoid attachments    Elijah CHRISTELLA Hidden, PT 10/24/2023, 1:11 PM

## 2023-10-25 ENCOUNTER — Encounter: Admitting: Physical Therapy

## 2023-10-26 DIAGNOSIS — M19012 Primary osteoarthritis, left shoulder: Secondary | ICD-10-CM | POA: Diagnosis not present

## 2023-10-29 ENCOUNTER — Other Ambulatory Visit: Payer: Self-pay | Admitting: *Deleted

## 2023-10-29 DIAGNOSIS — I7121 Aneurysm of the ascending aorta, without rupture: Secondary | ICD-10-CM

## 2023-10-31 ENCOUNTER — Encounter: Payer: Self-pay | Admitting: Physical Therapy

## 2023-10-31 ENCOUNTER — Ambulatory Visit: Attending: Orthopedic Surgery | Admitting: Physical Therapy

## 2023-10-31 DIAGNOSIS — M25612 Stiffness of left shoulder, not elsewhere classified: Secondary | ICD-10-CM | POA: Insufficient documentation

## 2023-10-31 DIAGNOSIS — R293 Abnormal posture: Secondary | ICD-10-CM | POA: Diagnosis not present

## 2023-10-31 DIAGNOSIS — M6281 Muscle weakness (generalized): Secondary | ICD-10-CM | POA: Insufficient documentation

## 2023-10-31 DIAGNOSIS — M25512 Pain in left shoulder: Secondary | ICD-10-CM | POA: Insufficient documentation

## 2023-10-31 NOTE — Therapy (Signed)
 OUTPATIENT PHYSICAL THERAPY TREATMENT      Patient Name: [name] MRN: [id number] DOB:07/04/1945, 78 y.o., male Today's Date: 10/31/2023   END OF SESSION:  PT End of Session - 10/31/23 1157     Visit Number 15    Date for Recertification  11/07/23    Authorization Type [employer]    Progress Note Due on Visit 24   MD PN for 10/26/23 on visit #14 (10/24/23)   PT Start Time 1157    PT Stop Time 1238    PT Time Calculation (min) 41 min    Activity Tolerance Patient tolerated treatment well    Behavior During Therapy [facility] for tasks assessed/performed              Past Medical History:  Diagnosis Date   Actinic keratosis 02/14/2011   Allergy    Aneurysm of ascending aorta without rupture 01/06/2022   Aortic stenosis    Arthritis    Bilateral sensorineural hearing loss 06/16/2009   CAD (coronary artery disease)    cabg   Complication of anesthesia    Coronary atherosclerosis of native coronary artery 06/15/2009   Diabetic polyneuropathy 11/11/2018   Dyslipidemia 11/05/2018   Embolic stroke involving left middle cerebral artery  05/30/2021   Erectile dysfunction 05/18/2009   Essential hypertension 06/16/2009   Expressive aphasia 06/25/2021   External hemorrhoids 03/21/2010   Gout 04/09/2015   Heart murmur    Hemochromatosis 02/14/2011   History of aortic valve replacement 10/13/2009   History of CABG 11/05/2018   2005- 1. Coronary artery bypass grafting x4 with LIMA-LAD, SVG-OM1, SVG-AM and dCFX.  2. Aortic valve replacement with [employer] AVR 3. Septal myomectomy.  4. Myoview low risk 2016   History of hepatitis 01/31/1972   Hypertriglyceridemia 06/13/2010   Hypertrophic obstructive cardiomyopathy 05/06/2008   SP septal myomectomy at surgery 2005 Echo Nov 2018- Hyperdynamic LVEF with severe basal septal hypertrophy. There is chordal SAM with resting gradient of 16 mmHg that increases to 85 mmHg with Valsalva        Leg weakness, bilateral 01/18/2021   Long  term (current) use of anticoagulants 05/22/2018   Lumbar stenosis with neurogenic claudication 02/04/2021   Malignant neoplasm of prostate 06/17/2009   Mechanical heart valve present    Manufacturer: Deitra Rosemary Blade #: [id number]  Model #: [id number]. Card states MRI compatible with 3 teslas or less.   Obesity, unspecified 04/29/2008   OSA (obstructive sleep apnea) 07/12/2012   moderate-severe; uses CPAP nightly   PONV (postoperative nausea and vomiting)    after CABG- slow to wake up   Presence of prosthetic heart valve    Primary osteoarthritis of left shoulder 10/31/2018   S/P lumbar laminectomy 02/04/2021   Stage 3 chronic kidney disease due to type 2 diabetes mellitus 11/11/2018   Tobacco use 06/16/2009   Type 2 diabetes mellitus with hyperglycemia    Ventral hernia 03/03/2014   Past Surgical History:  Procedure Laterality Date   AORTIC VALVE REPLACEMENT  11/14/2003   [facility]   APPENDECTOMY  1990   CHOLECYSTECTOMY  1990   CORONARY ANGIOGRAPHY  10/11/2021   CORONARY ARTERY BYPASS GRAFT  10/2003   HERNIA REPAIR  1999   right, inguinal   HERNIA REPAIR  2002   left, inguinal   HIP SURGERY  2006   right hip   IR CT HEAD LTD  02/03/2022   IR PERCUTANEOUS ART THROMBECTOMY/INFUSION INTRACRANIAL INC DIAG ANGIO  01/24/2022   LUMBAR  LAMINECTOMY/DECOMPRESSION MICRODISCECTOMY Bilateral 02/04/2021   Procedure: Bilateral Lumbar Two-Three Laminectomy;  Surgeon: Colon Shove, MD;  Location: [facility] OR;  Service: Neurosurgery;  Laterality: Bilateral;  3C/RM 20   PILONIDAL CYST EXCISION  1964   prostate seed implant  03/2010   RADIOLOGY WITH ANESTHESIA N/A 01/24/2022   Procedure: IR WITH ANESTHESIA;  Surgeon: Radiologist, Medication, MD;  Location: [facility] OR;  Service: Radiology;  Laterality: N/A;   REVERSE SHOULDER ARTHROPLASTY Left 08/07/2023   Procedure: ARTHROPLASTY, SHOULDER, TOTAL, REVERSE;  Surgeon: Josefina Chew, MD;  Location: [facility] ORS;  Service: Orthopedics;  Laterality: Left;   TEE  WITHOUT CARDIOVERSION N/A 03/14/2022   Procedure: TRANSESOPHAGEAL ECHOCARDIOGRAM (TEE);  Surgeon: Loni Soyla LABOR, MD;  Location: [facility] ENDOSCOPY;  Service: Cardiology;  Laterality: N/A;   TONSILLECTOMY  childhood   Patient Active Problem List   Diagnosis Date Noted   S/P reverse total shoulder arthroplasty, left 08/07/2023   Pre-op evaluation 01/17/2023   Left shoulder pain 11/17/2022   Hypertrophic cardiomyopathy (HCC) 08/12/2022   Chronic anticoagulation 08/12/2022   Hypotension 04/27/2022   H/O: CVA (cerebrovascular accident) 03/14/2022   Urinary retention 01/26/2022   Acute ischemic stroke (HCC) 01/24/2022   Middle cerebral artery embolism, left 01/24/2022   Aneurysm of ascending aorta without rupture 01/06/2022   Type 2 diabetes mellitus with stage 3a chronic kidney disease, with long-term current use of insulin  (HCC) 10/30/2021   Type 2 diabetes mellitus with diabetic polyneuropathy, with long-term current use of insulin  (HCC) 10/30/2021   NSTEMI (non-ST elevated myocardial infarction) (HCC) 10/09/2021   Paroxysmal atrial fibrillation (HCC) 10/09/2021   Presence of prosthetic heart valve    Type 2 diabetes mellitus with hyperglycemia    Expressive aphasia 06/25/2021   History of CVA (cerebrovascular accident) 05/2021   S/P lumbar laminectomy 02/04/2021   Lumbar stenosis with neurogenic claudication 02/04/2021   History of falling 01/30/2021   Diabetic polyneuropathy 11/11/2018   Stage 3 chronic kidney disease due to type 2 diabetes mellitus 11/11/2018   History of CABG 11/05/2018   Dyslipidemia 11/05/2018   Primary osteoarthritis, left shoulder 10/31/2018   Long term (current) use of anticoagulants 05/22/2018   Ventral hernia 03/03/2014   OSA (obstructive sleep apnea) 07/12/2012   Hemochromatosis 02/14/2011   Actinic keratosis 02/14/2011   Hypertriglyceridemia 06/13/2010   External hemorrhoids 03/21/2010   History of aortic valve replacement 10/13/2009   Malignant  neoplasm of prostate (HCC) 06/17/2009   Bilateral sensorineural hearing loss 06/16/2009   Essential hypertension 06/16/2009   Coronary atherosclerosis of native coronary artery 06/15/2009   Erectile dysfunction 05/18/2009   Obesity, unspecified 04/29/2008    PCP: Daryl Setter, NP   REFERRING PROVIDER: Josefina Chew, MD   REFERRING DIAG: [phone] (ICD-10-CM) - Status post reverse total shoulder replacement, left  THERAPY DIAG:  Stiffness of left shoulder, not elsewhere classified  Acute pain of left shoulder  Muscle weakness (generalized)  Abnormal posture  RATIONALE FOR EVALUATION AND TREATMENT: Rehabilitation  ONSET DATE: 08/07/2023 - L reverse TSA  NEXT MD VISIT: 3-6 months   SUBJECTIVE:  SUBJECTIVE STATEMENT:  Pt reports he saw the MD last week who cleared him to pitch and putt. MD was pleased with his ROM.  Pt inquiring about when he will be finished with PT.  PAIN: Are you having pain? No and Yes: NPRS scale: 0/10 Pain location: L shoulder  Pain description: ache  Aggravating factors: perturbation to arm  Relieving factors: ice, Tylenol  PRN   PERTINENT HISTORY:  Aneurysm of ascending aorta, aortic stenosis, B sensorineural hearing loss - extremely HOH, CAD s/p CABG, cardiomyopathy, AVR, chronic anticoagulation, DM-II with neuropathy, CVA of MCA, HTN, expressive aphasia, gout, lumbar stenosis s/p lumbar laminectomy and decompression 2023, shoulder OA, CKD, ventral hernia, hernia repair, hip surgery, hx multiple strokes (3-4), prostate cancer   PRECAUTIONS: Shoulder -  Avoid resisted IR/backward extension until 3 months post-op to protect subscapularis repair Progress patient in scapular plane from PROM to AAROM to AROM ensuring proper kinematics throughout care to  avoid over stress to deltoid/deltoid attachments   RED FLAGS: None  HAND DOMINANCE: Right  WEIGHT BEARING RESTRICTIONS: Yes Phase I (1-4 wks): <1 lb; Phase II-IV (4-12 wks): <5 lbs; Phase V (12-16 wks): advance to tolerance; Phase VI (16 wks to 6 mo): Full  FALLS:  Has patient fallen in last 6 months? Yes. Number of falls 1 - fell in garden in May  LIVING ENVIRONMENT:  Lives with: lives with their family Lives in: House/apartment Stairs: No Has following equipment at home: Environmental consultant - 2 wheeled, Crutches, and Grab bars  OCCUPATION: Retired  PLOF: Independent with household mobility without device, Independent with community mobility with device, Needs assistance with ADLs, and Leisure: gardening, 2-3 days/wk at [employer]  PATIENT GOALS: To use normal ROM (for my arm) as much as I can. To not be in pain all the time.   OBJECTIVE: (objective measures completed at initial evaluation unless otherwise dated)  DIAGNOSTIC FINDINGS:  08/07/23 - DG Left Shoulder: CLINICAL DATA:  Status post reverse shoulder arthroplasty.   FINDINGS: Total left shoulder arthroplasty. The arthroplasty components appear intact and in anatomic alignment. There is no acute fracture ordislocation. Postsurgical changes in the soft tissues of the leftshoulder.   IMPRESSION: Total left shoulder arthroplasty.  PATIENT SURVEYS:  Quick Dash: 68.2 / 100 = 68.2 % Minimally Clinically Important Difference (MCID): 15-20 points  COGNITION: Overall cognitive status: Within functional limits for tasks assessed     SENSATION: WFL  POSTURE: rounded shoulders and forward head  UPPER EXTREMITY ROM:   Passive ROM Left eval L 08/30/23 L 09/06/23 L 09/14/23 L 10/03/23  Shoulder flexion 58 90 110 119 120  Shoulder extension       Shoulder abduction 50 scaption 90 scaption 105 117 scaption 128  Shoulder adduction       Shoulder internal rotation       Shoulder external rotation -6 from neutral  24 at ~30 scaption  21 31 at ~30 scaption 30   Active ROM Right  L 10/12/23 standing L 10/17/23 L 10/24/23  Shoulder flexion  108 110 118  Shoulder extension      Shoulder abduction  102 Scaption- 110 111 scaption  Shoulder adduction      Shoulder internal rotation      Shoulder external rotation  40 at 90 degrees abduction  38 at ~30 scaption  (Blank rows = not tested)  UPPER EXTREMITY MMT: (deferred on eval d/t only 1 week postop)  MMT R 10/24/23 L 10/24/23  Shoulder flexion 5 4+  Shoulder extension  Shoulder abduction 5 4+ scaption  Shoulder adduction    Shoulder internal rotation    Shoulder external rotation 4 4-  Middle trapezius    Lower trapezius    (Blank rows = not tested)  JOINT MOBILITY TESTING:  Patient very guarded with initial PROM  PALPATION/OBSERVATION: Bruising draining into upper arm.  Skin at cubital fossa pale and slightly moist/macerated.    TODAY'S TREATMENT:   10/31/23 THERAPEUTIC EXERCISE: To improve endurance, ROM, and flexibility.  Demonstration, verbal and tactile cues throughout for technique.  Pulleys - flexion and scaption x 3' each - cues to remain in pain free ROM Standing L shoulder extension AAROM with cane behind back x 10 Standing B shoulder IR AAROM with cane drawing up back x 10 Standing L shoulder IR AAROM with cane pulling gently to R behind back x 10   NEUROMUSCULAR RE-EDUCATION: To improve coordination, kinesthesia, and posture. Standing YTB scap retraction + B shoulder row to neutral 10 x 3 - cues to avoid up and backward rotation of elbows, focusing on scap retraction Standing YTB scap retraction + B shoulder extension to neutral 10 x 3 - cues to focus on scap retraction and avoid pushing excessively into extension Standing YTB L shoulder IR with towel roll under arm to maintain neutral shoulder 10 x 3  Standing YTB scap retraction + L shoulder ER with towel roll under arm to maintain neutral shoulder 10 x 3  Standing YTB L shoulder flexion to  90 x 10  THERAPEUTIC ACTIVITIES: To improve functional performance.  Demonstration, verbal and tactile cues throughout for technique. Active L shoulder flexion cabinet reaches to 1st and 2nd shelves 1# x 10  Active L shoulder scaption cabinet reaches to 1st and 2nd shelves 1# x 10    10/24/23 THERAPEUTIC EXERCISE: To improve strength, endurance, ROM, and flexibility.  Demonstration, verbal and tactile cues throughout for technique.  Pulleys - flexion and scaption x 3' each - cues to remain in pain free ROM Standing L shoulder flexion wall slide x 10 Standing L shoulder scaption wall slide x 10  THERAPEUTIC ACTIVITIES: To improve functional performance.  Demonstration, verbal and tactile cues throughout for technique.  Shoulder ROM assessment UE MMT QuickDASH: 22.7 / 100 = 22.7 % Goal assessment  NEUROMUSCULAR RE-EDUCATION: To improve coordination, kinesthesia, posture, and proprioception.  L shoulder CW/CCW circles with small ball on wall in standing x 15 each direction Standing scap retraction into pool noodle on wall + B shoulder flexion x 10 Standing scap retraction into pool noodle on wall + B shoulder scaption x 10  MANUAL THERAPY: To promote normalized muscle tension, improved flexibility, improved joint mobility, and increased ROM utilizing connective tissue massage, therapeutic massage, and manual TP therapy.  STM/DTM and manual TPR to L pecs (esp pec minor), infraspinatus and teres group   10/17/23 Pulleys 3 min flexion and scaption All exercises with L UE: Counter slides into flexion x 20, scaption x 20 w/ 1lb weight Counter CW/CCW circles x 20 each way into flexion Wall slide flexion x 10 Serratus punch 3lb x 20 CW/CCW circles supine 3lb x 10 each way STM to L anterior deltoid MWM into shoulder flexion ER PROM supine  Standing AAROM flexion with cane x 10; scaption x 10    10/15/23 THERAPEUTIC EXERCISE: To improve ROM.  Demonstration, verbal and tactile cues  throughout for technique. Pulleys flexion x 2'; scaption x 2' Supine PROM L shoulder   THERAPEUTIC ACTIVITIES: To improve functional performance.  Demonstration, verbal  and tactile cues throughout for technique. Supine 3# serratus press up x 20 LUE Supine 3# small circles at 90 CW x 10; CCW x 10 with manual PT assist Standing counter dust small circles CW x 20; CCW x 20 within precautionary ROM with PT assisting manually Standing shoulder scaption in  L-bar position with cane x 20 LUE with mirror to prevent shoulder hiking Standing wall dust scaption x 20 LUE Standing ER 1# x 30 LUE with PT manual assist for correct form Standing shoulder rows RTB x 30 BUE w/ PT assist to prevent extension past body Standing shoulder ext RTB x 30 BUE w/ PT assist to prevent extension past body Standing cane ER stretch x 1' x 2   10/12/23 Pulleys Flexion and scaption Reviewed and altered his HEP as he requested many changes Issued red tband Supine 3# punches with PT limiting motion Supine 3# serratus punches Supine 3# isometric circles Supine 2# flexion from 90 degrees to his tolerance Supine 1# ER from belly with elbow at 60 degrees abduction Right sidelying 1# ER holding elbow at side All above 2x10 Supine PROM, jome gentle joint distraction Measured AROM in standing  10/08/2023 THERAPEUTIC ACTIVITIES: To improve functional performance with overhead reach.  Demonstration, verbal and tactile cues throughout for technique. Pulleys - flexion and scaption x 3' each - cues to remain in pain free ROM Inclined L shoulder flexion 2 x 10- cues to avoid painful ROM Inclined L shoulder scaption 2 x 10 - cues to avoid painful ROM  NEUROMUSCULAR RE-EDUCATION: To improve coordination, kinesthesia, posture, proprioception, and amplitude of movement. Seated bent over prone scapular retraction with (cues to avoid shoulder shrug and avoid extension ROM beyond neutral): Row to neutral 2 x 10 Shoulder extension to  neutral 2 x 10 L shoulder rhythmic stabilization at 90 flexion x 30 sec with 2-finger perturbation at elbow, 2 x 30 sec with 2-finger perturbation at distal forearm  Supine L shoulder CW/CCW small circles at 90 flexion 2 x 10 each direction (cues to keep circular motion small amplitude) L shoulder YTB reactive isometrics step-outs - neutral shoulder with elbow at 90: Flexion x 10 IR x 10 ER x 5   10/05/2023 THERAPEUTIC ACTIVITIES: To improve functional performance with overhead reach.  Demonstration, verbal and tactile cues throughout for technique. Pulleys - flexion and scaption x 3' each - cues to remain in pain free ROM  NEUROMUSCULAR RE-EDUCATION: To improve coordination, kinesthesia, posture, proprioception, and amplitude of movement. Seated bent over prone scapular retraction with (cues to avoid shoulder shrug and avoid extension ROM beyond neutral): Row to neutral 2 x 10 Shoulder extension to neutral 2 x 10 L shoulder rhythmic stabilization at 90 flexion x 30 sec with 2-finger perturbation at elbow, 2 x 30 sec with 2-finger perturbation at distal forearm Supine L shoulder CW/CCW small circles at 90 flexion 2 x 10 each direction (cues to keep circular motion small amplitude) L shoulder YTB reactive isometrics step-outs - neutral shoulder with elbow at 90: Flexion x 10 IR x 10 ER x 5   10/03/2023  THERAPEUTIC ACTIVITY: to improve ability to reach overhead Pulleys - flexion and scaption x 3' each - cues to remain in pain free ROM Supine inclined shoulder AAROM flexion with wand x 10 Supine inclined shoulder AROM flexion 2 x 10 Supine inclined shoulder AROM scaption with wand 2 x 10  NEUROMUSCULAR RE-EDUCATION: To improve coordination, kinesthesia, posture, and proprioception. Standing YTB row 3 x 10 (VC & TC to avoid  shoulder shrug) Standing YTB shoulder extension to neutral 3 x 10 Prone scapular retraction with (cues to avoid shoulder shrug): Row to neutral 3 x 10 Shoulder  extension to neutral 3 x 10   09/25/2023  THERAPEUTIC EXERCISE: To improve strength, endurance, ROM, and flexibility.  Demonstration, verbal and tactile cues throughout for technique. Pulleys - flexion and scaption x 3' each - cues to remain in pain free ROM Supine L shoulder AROM flexion with elbow flexed (uppercut) 2 x 10 Supine L shoulder AROM scaption with elbow flexed (hitchhiker) 2 x 10  NEUROMUSCULAR RE-EDUCATION: To improve coordination, kinesthesia, posture, and proprioception. Standing YTB scap retraction + B shoulder row 2 x 10 (VC & TC to avoid shoulder shrug) Standing YTB scap retraction + B shoulder extension to neutral 2 x 10 Prone scapular retraction with (cues to avoid shoulder shrug): Row to neutral 2 x 10 Shoulder extension to neutral 2 x 10 L shoulder rhythmic stabilization at 90 flexion x 30 sec with 2-finger perturbation at elbow, 2 x 30 sec with 2-finger perturbation at distal forearm   PATIENT EDUCATION:  Education details: HEP review and HEP update  Person educated: Patient Education method: Explanation, Demonstration, Verbal cues, Tactile cues, and MedBridgeGO app updated Education comprehension: verbalized understanding, returned demonstration, verbal cues required, tactile cues required, and needs further education  HOME EXERCISE PROGRAM: *Pt using MedBridgeGO app  Access Code: [email or web address] URL: [email or web address] Date: 10/31/2023 Prepared by: Elijah Hidden  Exercises - Seated Shoulder External Rotation AAROM with Cane and Hand in Neutral (Mirrored)  - 1 x daily - 5 x weekly - 2 sets - 10 reps - 3 sec hold - Standing Shoulder Extension ROM with Dowel  - 1 x daily - 5 x weekly - 2 sets - 10 reps - 3 sec hold - Standing Bilateral Shoulder Internal Rotation AAROM with Dowel  - 1 x daily - 5 x weekly - 2 sets - 10 reps - 3 sec hold - Standing Shoulder Internal Rotation AAROM with Dowel (Mirrored)  - 1 x daily - 5 x weekly - 2 sets - 10 reps - 3  sec hold - Seated Shoulder Extension  - 1 x daily - 3 x weekly - 2 sets - 10 reps - 3 sec hold - Seated Bent Over Shoulder Row with Dumbbells  - 1 x daily - 3 x weekly - 2 sets - 10 reps - 3 sec hold - Standing Shoulder Row with Anchored Resistance  - 1 x daily - 3 x weekly - 2 sets - 10 reps - 5 sec hold - Shoulder extension with resistance - Neutral  - 1 x daily - 3 x weekly - 2 sets - 10 reps - 5 sec hold - Shoulder Internal Rotation with Resistance  - 1 x daily - 3 x weekly - 1-2 sets - 10 reps - 3 sec hold - Shoulder External Rotation with Anchored Resistance  - 1 x daily - 3 x weekly - 1-2 sets - 10 reps - 3 sec hold - Standing Single Arm Shoulder Flexion with Posterior Anchored Resistance  - 1 x daily - 3 x weekly - 2 sets - 10 reps - 3 sec hold - Active Shoulder Flexion To Each Shelf  - 1 x daily - 3 x weekly - 2 sets - 10 reps - Active Shoulder Scaption To Each Shelf  - 1 x daily - 3 x weekly - 2 sets - 10 reps - Supine Shoulder Circles  - 1 x daily -  3 x weekly - 2 sets - 10 reps - 3 sec hold   ASSESSMENT:  CLINICAL IMPRESSION:  [name] reports the surgeon was pleased with his progress and ROM, and cleared him to start with pitching and putting for golf. He entered phase V of the rehab protocol this week, therefore introduced gentle stretching/AAROM into extension and behind the back IR to facilitate patient's ability to complete lower body dressing with increased ease of pulling up his pants.  Patient cautioned to avoid forcing motion or pushing motion into painful ROM.  Progressed strengthening from reactive isometrics to YTB resisted AROM within limited ROM while continuing to provide cues for proper shoulder alignment and GH kinematics.  [name]'s current POC will expire on 11/07/23 and his last scheduled visit is 11/02/23 - will assess response to today's exercise progression and reassess goals at that time to determine readiness to transition to HEP vs need for recert.   EVAL: [name] is a 78 y.o. male who was referred to physical therapy for evaluation and treatment s/p L reverse TSA on 08/07/2023.  Patient reports nerve block wore off on POD #2 but pain has been mild (</= 3/10) since. Pain is worse with with perturbation to L UE.  Patient has deficits in L shoulder ROM and flexibility, B shoulder and postural strength, and abnormal posture which are interfering with ADLs and are impacting quality of life.  On QuickDASH patient scored 68.2/100 demonstrating 68.2% disability.  Potential for possible skin breakdown observed in L cubital fossa from moisture trapped while being in sling, therefore pt encouraged to spend a little more time out of the sling while seated to allow for air circulation to skin.  Dorell will benefit from skilled PT to address above deficits to improve mobility and activity tolerance with decreased pain interference.  OBJECTIVE IMPAIRMENTS: decreased activity tolerance, decreased knowledge of condition, decreased mobility, decreased ROM, decreased strength, increased fascial restrictions, impaired perceived functional ability, increased muscle spasms, impaired flexibility, improper body mechanics, postural dysfunction, and pain.   ACTIVITY LIMITATIONS: carrying, lifting, sleeping, bed mobility, bathing, toileting, dressing, self feeding, reach over head, and caring for others  PARTICIPATION LIMITATIONS: meal prep, cleaning, laundry, driving, and community activity  PERSONAL FACTORS: Age, Fitness, Past/current experiences, Time since onset of injury/illness/exacerbation, and 3+ comorbidities: Aneurysm of ascending aorta, aortic stenosis, B sensorineural hearing loss - extremely HOH, CAD s/p CABG, cardiomyopathy, AVR, chronic anticoagulation, DM-II with neuropathy, CVA of MCA, HTN, expressive aphasia, gout, lumbar stenosis s/p lumbar laminectomy and decompression 2023, shoulder OA, CKD, ventral hernia, hernia repair, hip surgery, hx multiple strokes (3-4),  prostate cancer  are also affecting patient's functional outcome.   REHAB POTENTIAL: Good  CLINICAL DECISION MAKING: Evolving/moderate complexity  EVALUATION COMPLEXITY: Moderate   GOALS: Goals reviewed with patient? Yes  SHORT TERM GOALS: Target date: 09/26/2023  Patient will be independent with initial HEP to improve outcomes and carryover.  Baseline: Initial HEP provided on eval 08/30/23 - reviewed HEP and reinforced only P/AAROM at this phase of rehab protocol 09/14/23 - reviewed HEP and reinforced only P/AAROM at this phase of rehab protocol Goal status: MET - 09/20/23  2.  Patient will report 25% improvement in L shoulder pain to improve QOL.   Baseline: up to 3/10 Goal status: MET - 09/14/23 - 85% improved   3.  Patient will improve L shoulder flexion and scaption to >/=90 to improve functional use of L UE. Baseline: Refer to above UE ROM table Goal status: MET - 09/06/23  see ROM table  LONG TERM GOALS: Target date: 11/07/2023  Patient will be independent with ongoing/advanced HEP for self-management at home.  Baseline:  09/25/23 - HEP updated today 10/05/23 - HEP updated today Goal status: IN PROGRESS - 10/24/23 - continue with current HEP  2.  Patient will report 50-75% improvement in L shoulder pain to improve QOL.  Baseline: up to 3/10 Goal status: MET - 09/14/23 - 85% improved   3.  Patient to demonstrate improved upright posture with posterior shoulder girdle engaged to promote improved glenohumeral joint mobility. Baseline:  Goal status: IN PROGRESS - 10/24/23   4.  Patient to improve L shoulder AROM to [facility] without pain provocation to allow for increased ease of ADLs.  Baseline: Refer to above UE ROM table 10/03/23 - L shoulder flexion and scaption/abduction nearing expected outcomes status post reverse TSA Goal status: IN PROGRESS - 10/24/23  5.  Patient will demonstrate improved L shoulder strength to >/= 4/5 for functional UE use. Baseline: Refer to above UE MMT  table Goal status: PARTIALLY MET - 10/24/23 - Met for flexion & scaption, ER 4-/5  6  Patient will report </= 50% on [employer]  to demonstrate improved functional ability.  Baseline: 68.2 / 100 = 68.2 % Goal status: MET - 10/24/23 - 22.7 / 100 = 22.7 %   PLAN:  PT FREQUENCY: 1-2x/week - 1x/wk for rehab phase I (1-4 wks), 1-2x/wk thereafter  PT DURATION: 12 weeks  PLANNED INTERVENTIONS: 02835- PT Re-evaluation, 97750- Physical Performance Testing, 97110-Therapeutic exercises, 97530- Therapeutic activity, [id number]- Neuromuscular re-education, 97535- Self Care, 02859- Manual therapy, [id number]- Electrical stimulation (unattended), 97016- Vasopneumatic device, 97035- Ultrasound, 02966- Ionotophoresis 4mg /ml Dexamethasone , Patient/Family education, Taping, Joint mobilization, Cryotherapy, and Moist heat  PLAN FOR NEXT SESSION: Assess response to exercise progression and reassess goals to determine readiness to transition to HEP vs need for recert; If continuing, will reduce frequency to 1x/wk and continue gradual progression per L reverse TSA protocol phase V (12-16 wks) - post-op week #12 as of 10/30/23 (surgery 08/07/23)    Elijah CHRISTELLA Hidden, PT 10/31/2023, 12:46 PM

## 2023-11-01 ENCOUNTER — Ambulatory Visit: Admitting: Cardiology

## 2023-11-01 ENCOUNTER — Ambulatory Visit: Attending: Cardiology

## 2023-11-01 DIAGNOSIS — Z7901 Long term (current) use of anticoagulants: Secondary | ICD-10-CM | POA: Diagnosis not present

## 2023-11-01 DIAGNOSIS — Z952 Presence of prosthetic heart valve: Secondary | ICD-10-CM

## 2023-11-01 LAB — POCT INR: INR: 3.1 — AB (ref 2.0–3.0)

## 2023-11-01 NOTE — Patient Instructions (Signed)
 Continue taking warfarin 1 tablet daily except 1/2 tablet on Mondays, Wednesdays, and Fridays. Remain consistent with leafy green (increase intake to 4 servings/week).  Recheck INR in 4 weeks Coumadin  Clinic [phone].  Clearance Fax # [phone]

## 2023-11-01 NOTE — Progress Notes (Signed)
 INR 3.1 Please see anticoagulation encounter Continue taking warfarin 1 tablet daily except 1/2 tablet on Mondays, Wednesdays, and Fridays. Remain consistent with leafy green (increase intake to 4 servings/week).  Recheck INR in 4 weeks Coumadin  Clinic [phone].  Clearance Fax # [phone]

## 2023-11-02 ENCOUNTER — Ambulatory Visit: Admitting: Physical Therapy

## 2023-11-02 ENCOUNTER — Encounter: Payer: Self-pay | Admitting: Physical Therapy

## 2023-11-02 DIAGNOSIS — M25612 Stiffness of left shoulder, not elsewhere classified: Secondary | ICD-10-CM

## 2023-11-02 DIAGNOSIS — M6281 Muscle weakness (generalized): Secondary | ICD-10-CM | POA: Diagnosis not present

## 2023-11-02 DIAGNOSIS — R293 Abnormal posture: Secondary | ICD-10-CM | POA: Diagnosis not present

## 2023-11-02 DIAGNOSIS — M25512 Pain in left shoulder: Secondary | ICD-10-CM

## 2023-11-02 NOTE — Therapy (Signed)
 OUTPATIENT PHYSICAL THERAPY TREATMENT / RECERTIFICATION  Progress Note  Reporting Period 10/24/2023 to 11/02/2023   See note below for Objective Data and Assessment of Progress/Goals.      Patient Name: [name] MRN: [id number] DOB:[birth year], 78 y.o., male Today's Date: 11/02/2023   END OF SESSION:  PT End of Session - 11/02/23 1022     Visit Number 16    Date for Recertification  11/30/23    Authorization Type [employer]    Progress Note Due on Visit 24   MD PN for 10/26/23 on visit #14 (10/24/23)   PT Start Time 1022    PT Stop Time 1104    PT Time Calculation (min) 42 min    Activity Tolerance Patient tolerated treatment well    Behavior During Therapy [facility] for tasks assessed/performed            Past Medical History:  Diagnosis Date   Actinic keratosis 02/14/2011   Allergy    Aneurysm of ascending aorta without rupture 01/06/2022   Aortic stenosis    Arthritis    Bilateral sensorineural hearing loss 06/16/2009   CAD (coronary artery disease)    cabg   Complication of anesthesia    Coronary atherosclerosis of native coronary artery 06/15/2009   Diabetic polyneuropathy 11/11/2018   Dyslipidemia 11/05/2018   Embolic stroke involving left middle cerebral artery  05/30/2021   Erectile dysfunction 05/18/2009   Essential hypertension 06/16/2009   Expressive aphasia 06/25/2021   External hemorrhoids 03/21/2010   Gout 04/09/2015   Heart murmur    Hemochromatosis 02/14/2011   History of aortic valve replacement 10/13/2009   History of CABG 11/05/2018   2005- 1. Coronary artery bypass grafting x4 with LIMA-LAD, SVG-OM1, SVG-AM and dCFX.  2. Aortic valve replacement with [employer] AVR 3. Septal myomectomy.  4. Myoview low risk 2016   History of hepatitis 01/31/1972   Hypertriglyceridemia 06/13/2010   Hypertrophic obstructive cardiomyopathy 05/06/2008   SP septal myomectomy at surgery 2005 Echo Nov 2018- Hyperdynamic LVEF with severe basal septal hypertrophy.  There is chordal SAM with resting gradient of 16 mmHg that increases to 85 mmHg with Valsalva        Leg weakness, bilateral 01/18/2021   Long term (current) use of anticoagulants 05/22/2018   Lumbar stenosis with neurogenic claudication 02/04/2021   Malignant neoplasm of prostate 06/17/2009   Mechanical heart valve present    Manufacturer: Deitra Rosemary Blade #: [id number]  Model #: [id number]. Card states MRI compatible with 3 teslas or less.   Obesity, unspecified 04/29/2008   OSA (obstructive sleep apnea) 07/12/2012   moderate-severe; uses CPAP nightly   PONV (postoperative nausea and vomiting)    after CABG- slow to wake up   Presence of prosthetic heart valve    Primary osteoarthritis of left shoulder 10/31/2018   S/P lumbar laminectomy 02/04/2021   Stage 3 chronic kidney disease due to type 2 diabetes mellitus 11/11/2018   Tobacco use 06/16/2009   Type 2 diabetes mellitus with hyperglycemia    Ventral hernia 03/03/2014   Past Surgical History:  Procedure Laterality Date   AORTIC VALVE REPLACEMENT  11/14/2003   [facility]   APPENDECTOMY  1990   CHOLECYSTECTOMY  1990   CORONARY ANGIOGRAPHY  10/11/2021   CORONARY ARTERY BYPASS GRAFT  10/2003   HERNIA REPAIR  1999   right, inguinal   HERNIA REPAIR  2002   left, inguinal   HIP SURGERY  2006   right hip  IR CT HEAD LTD  02/03/2022   IR PERCUTANEOUS ART THROMBECTOMY/INFUSION INTRACRANIAL INC DIAG ANGIO  01/24/2022   LUMBAR LAMINECTOMY/DECOMPRESSION MICRODISCECTOMY Bilateral 02/04/2021   Procedure: Bilateral Lumbar Two-Three Laminectomy;  Surgeon: Colon Shove, MD;  Location: [facility] OR;  Service: Neurosurgery;  Laterality: Bilateral;  3C/RM 20   PILONIDAL CYST EXCISION  1964   prostate seed implant  03/2010   RADIOLOGY WITH ANESTHESIA N/A 01/24/2022   Procedure: IR WITH ANESTHESIA;  Surgeon: Radiologist, Medication, MD;  Location: [facility] OR;  Service: Radiology;  Laterality: N/A;   REVERSE SHOULDER ARTHROPLASTY Left 08/07/2023    Procedure: ARTHROPLASTY, SHOULDER, TOTAL, REVERSE;  Surgeon: Josefina Chew, MD;  Location: [facility] ORS;  Service: Orthopedics;  Laterality: Left;   TEE WITHOUT CARDIOVERSION N/A 03/14/2022   Procedure: TRANSESOPHAGEAL ECHOCARDIOGRAM (TEE);  Surgeon: Loni Soyla LABOR, MD;  Location: [facility] ENDOSCOPY;  Service: Cardiology;  Laterality: N/A;   TONSILLECTOMY  childhood   Patient Active Problem List   Diagnosis Date Noted   S/P reverse total shoulder arthroplasty, left 08/07/2023   Pre-op evaluation 01/17/2023   Left shoulder pain 11/17/2022   Hypertrophic cardiomyopathy (HCC) 08/12/2022   Chronic anticoagulation 08/12/2022   Hypotension 04/27/2022   H/O: CVA (cerebrovascular accident) 03/14/2022   Urinary retention 01/26/2022   Acute ischemic stroke (HCC) 01/24/2022   Middle cerebral artery embolism, left 01/24/2022   Aneurysm of ascending aorta without rupture 01/06/2022   Type 2 diabetes mellitus with stage 3a chronic kidney disease, with long-term current use of insulin  (HCC) 10/30/2021   Type 2 diabetes mellitus with diabetic polyneuropathy, with long-term current use of insulin  (HCC) 10/30/2021   NSTEMI (non-ST elevated myocardial infarction) (HCC) 10/09/2021   Paroxysmal atrial fibrillation (HCC) 10/09/2021   Presence of prosthetic heart valve    Type 2 diabetes mellitus with hyperglycemia    Expressive aphasia 06/25/2021   History of CVA (cerebrovascular accident) 05/2021   S/P lumbar laminectomy 02/04/2021   Lumbar stenosis with neurogenic claudication 02/04/2021   History of falling 01/30/2021   Diabetic polyneuropathy 11/11/2018   Stage 3 chronic kidney disease due to type 2 diabetes mellitus 11/11/2018   History of CABG 11/05/2018   Dyslipidemia 11/05/2018   Primary osteoarthritis, left shoulder 10/31/2018   Long term (current) use of anticoagulants 05/22/2018   Ventral hernia 03/03/2014   OSA (obstructive sleep apnea) 07/12/2012   Hemochromatosis 02/14/2011   Actinic keratosis  02/14/2011   Hypertriglyceridemia 06/13/2010   External hemorrhoids 03/21/2010   History of aortic valve replacement 10/13/2009   Malignant neoplasm of prostate (HCC) 06/17/2009   Bilateral sensorineural hearing loss 06/16/2009   Essential hypertension 06/16/2009   Coronary atherosclerosis of native coronary artery 06/15/2009   Erectile dysfunction 05/18/2009   Obesity, unspecified 04/29/2008    PCP: Daryl Setter, NP   REFERRING PROVIDER: Josefina Chew, MD   REFERRING DIAG: [phone] (ICD-10-CM) - Status post reverse total shoulder replacement, left  THERAPY DIAG:  Stiffness of left shoulder, not elsewhere classified  Acute pain of left shoulder  Muscle weakness (generalized)  Abnormal posture  RATIONALE FOR EVALUATION AND TREATMENT: Rehabilitation  ONSET DATE: 08/07/2023 - L reverse TSA  NEXT MD VISIT: 3-6 months   SUBJECTIVE:  SUBJECTIVE STATEMENT:  Pt reports he noted some pain/discomfort when he tried the bent over shoulder extension this morning but otherwise no pain.  PAIN: Are you having pain? No and Yes: NPRS scale: 0/10 Pain location: L shoulder  Pain description: ache  Aggravating factors: perturbation to arm  Relieving factors: ice, Tylenol  PRN   PERTINENT HISTORY:  Aneurysm of ascending aorta, aortic stenosis, B sensorineural hearing loss - extremely HOH, CAD s/p CABG, cardiomyopathy, AVR, chronic anticoagulation, DM-II with neuropathy, CVA of MCA, HTN, expressive aphasia, gout, lumbar stenosis s/p lumbar laminectomy and decompression 2023, shoulder OA, CKD, ventral hernia, hernia repair, hip surgery, hx multiple strokes (3-4), prostate cancer   PRECAUTIONS: Shoulder -  Avoid resisted IR/backward extension until 3 months post-op to protect subscapularis  repair Progress patient in scapular plane from PROM to AAROM to AROM ensuring proper kinematics throughout care to avoid over stress to deltoid/deltoid attachments   RED FLAGS: None  HAND DOMINANCE: Right  WEIGHT BEARING RESTRICTIONS: Yes Phase I (1-4 wks): <1 lb; Phase II-IV (4-12 wks): <5 lbs; Phase V (12-16 wks): advance to tolerance; Phase VI (16 wks to 6 mo): Full  FALLS:  Has patient fallen in last 6 months? Yes. Number of falls 1 - fell in garden in May  LIVING ENVIRONMENT:  Lives with: lives with their family Lives in: House/apartment Stairs: No Has following equipment at home: Environmental consultant - 2 wheeled, Crutches, and Grab bars  OCCUPATION: Retired  PLOF: Independent with household mobility without device, Independent with community mobility with device, Needs assistance with ADLs, and Leisure: gardening, 2-3 days/wk at [employer]  PATIENT GOALS: To use normal ROM (for my arm) as much as I can. To not be in pain all the time.   OBJECTIVE: (objective measures completed at initial evaluation unless otherwise dated)  DIAGNOSTIC FINDINGS:  08/07/23 - DG Left Shoulder: CLINICAL DATA:  Status post reverse shoulder arthroplasty.   FINDINGS: Total left shoulder arthroplasty. The arthroplasty components appear intact and in anatomic alignment. There is no acute fracture ordislocation. Postsurgical changes in the soft tissues of the leftshoulder.   IMPRESSION: Total left shoulder arthroplasty.  PATIENT SURVEYS:  Quick Dash: 68.2 / 100 = 68.2 % Minimally Clinically Important Difference (MCID): 15-20 points  COGNITION: Overall cognitive status: Within functional limits for tasks assessed     SENSATION: WFL  POSTURE: rounded shoulders and forward head  UPPER EXTREMITY ROM:   Passive ROM Left eval L 08/30/23 L 09/06/23 L 09/14/23 L 10/03/23  Shoulder flexion 58 90 110 119 120  Shoulder extension       Shoulder abduction 50 scaption 90 scaption 105 117 scaption 128  Shoulder  adduction       Shoulder internal rotation       Shoulder external rotation -6 from neutral  24 at ~30 scaption 21 31 at ~30 scaption 30   Active ROM Right  L 10/12/23 standing L 10/17/23 L 10/24/23 L 11/02/23  Shoulder flexion  108 110 118 119  Shoulder extension     25  Shoulder abduction  102 Scaption- 110 111 scaption 110 scaption  Shoulder adduction       Shoulder internal rotation     74 at ~45 scaption  Shoulder external rotation  40 at 90 degrees abduction  38 at ~30 scaption 49 at ~45 scaption  (Blank rows = not tested)  UPPER EXTREMITY MMT: (deferred on eval d/t only 1 week postop)  MMT R 10/24/23 L 10/24/23  Shoulder flexion 5 4+  Shoulder  extension    Shoulder abduction 5 4+ scaption  Shoulder adduction    Shoulder internal rotation    Shoulder external rotation 4 4-  Middle trapezius    Lower trapezius    (Blank rows = not tested)  JOINT MOBILITY TESTING:  Patient very guarded with initial PROM  PALPATION/OBSERVATION: Bruising draining into upper arm.  Skin at cubital fossa pale and slightly moist/macerated.    TODAY'S TREATMENT:   11/02/23 - Recert THERAPEUTIC EXERCISE: To improve endurance, ROM, and flexibility.  Demonstration, verbal and tactile cues throughout for technique.  Pulleys - flexion and scaption x 3' each - cues to remain in pain free ROM Standing L shoulder extension AAROM with cane behind back 2 x 10 Standing B shoulder IR AAROM with cane drawing up back 2 x 10 Standing L shoulder IR AAROM with cane pulling gently to R behind back 2 x 10  MANUAL THERAPY: To promote normalized muscle tension, improved flexibility, increased ROM, and reduced pain utilizing connective tissue massage, therapeutic massage, and manual TP therapy. STM/DTM and manual TPR to L posterolateral deltoid  THERAPEUTIC ACTIVITIES: To improve functional performance.  Demonstration, verbal and tactile cues throughout for technique.  L shoulder ROM assessment Goal  assessment  SELF CARE:  Provided education on postural awareness t/o daily routine, including incorporating scapular retraction exercises as postural reminder/checks while at stop lights or watching TV. Verbal review of HEP, with HEP reorganized to split band and weight resisted exercises on alternating days, emphasizing need for rest days between days of strengthening.   10/31/23 THERAPEUTIC EXERCISE: To improve endurance, ROM, and flexibility.  Demonstration, verbal and tactile cues throughout for technique.  Pulleys - flexion and scaption x 3' each - cues to remain in pain free ROM Standing L shoulder extension AAROM with cane behind back x 10 Standing B shoulder IR AAROM with cane drawing up back x 10 Standing L shoulder IR AAROM with cane pulling gently to R behind back x 10   NEUROMUSCULAR RE-EDUCATION: To improve coordination, kinesthesia, and posture. Standing YTB scap retraction + B shoulder row to neutral 10 x 3 - cues to avoid up and backward rotation of elbows, focusing on scap retraction Standing YTB scap retraction + B shoulder extension to neutral 10 x 3 - cues to focus on scap retraction and avoid pushing excessively into extension Standing YTB L shoulder IR with towel roll under arm to maintain neutral shoulder 10 x 3  Standing YTB scap retraction + L shoulder ER with towel roll under arm to maintain neutral shoulder 10 x 3  Standing YTB L shoulder flexion to 90 x 10  THERAPEUTIC ACTIVITIES: To improve functional performance.  Demonstration, verbal and tactile cues throughout for technique. Active L shoulder flexion cabinet reaches to 1st and 2nd shelves 1# x 10  Active L shoulder scaption cabinet reaches to 1st and 2nd shelves 1# x 10    10/24/23 THERAPEUTIC EXERCISE: To improve strength, endurance, ROM, and flexibility.  Demonstration, verbal and tactile cues throughout for technique.  Pulleys - flexion and scaption x 3' each - cues to remain in pain free  ROM Standing L shoulder flexion wall slide x 10 Standing L shoulder scaption wall slide x 10  THERAPEUTIC ACTIVITIES: To improve functional performance.  Demonstration, verbal and tactile cues throughout for technique.  Shoulder ROM assessment UE MMT QuickDASH: 22.7 / 100 = 22.7 % Goal assessment  NEUROMUSCULAR RE-EDUCATION: To improve coordination, kinesthesia, posture, and proprioception.  L shoulder CW/CCW circles with small ball  on wall in standing x 15 each direction Standing scap retraction into pool noodle on wall + B shoulder flexion x 10 Standing scap retraction into pool noodle on wall + B shoulder scaption x 10  MANUAL THERAPY: To promote normalized muscle tension, improved flexibility, improved joint mobility, and increased ROM utilizing connective tissue massage, therapeutic massage, and manual TP therapy.  STM/DTM and manual TPR to L pecs (esp pec minor), infraspinatus and teres group   10/17/23 Pulleys 3 min flexion and scaption All exercises with L UE: Counter slides into flexion x 20, scaption x 20 w/ 1lb weight Counter CW/CCW circles x 20 each way into flexion Wall slide flexion x 10 Serratus punch 3lb x 20 CW/CCW circles supine 3lb x 10 each way STM to L anterior deltoid MWM into shoulder flexion ER PROM supine  Standing AAROM flexion with cane x 10; scaption x 10    10/15/23 THERAPEUTIC EXERCISE: To improve ROM.  Demonstration, verbal and tactile cues throughout for technique. Pulleys flexion x 2'; scaption x 2' Supine PROM L shoulder   THERAPEUTIC ACTIVITIES: To improve functional performance.  Demonstration, verbal and tactile cues throughout for technique. Supine 3# serratus press up x 20 LUE Supine 3# small circles at 90 CW x 10; CCW x 10 with manual PT assist Standing counter dust small circles CW x 20; CCW x 20 within precautionary ROM with PT assisting manually Standing shoulder scaption in  L-bar position with cane x 20 LUE with mirror to prevent  shoulder hiking Standing wall dust scaption x 20 LUE Standing ER 1# x 30 LUE with PT manual assist for correct form Standing shoulder rows RTB x 30 BUE w/ PT assist to prevent extension past body Standing shoulder ext RTB x 30 BUE w/ PT assist to prevent extension past body Standing cane ER stretch x 1' x 2   10/12/23 Pulleys Flexion and scaption Reviewed and altered his HEP as he requested many changes Issued red tband Supine 3# punches with PT limiting motion Supine 3# serratus punches Supine 3# isometric circles Supine 2# flexion from 90 degrees to his tolerance Supine 1# ER from belly with elbow at 60 degrees abduction Right sidelying 1# ER holding elbow at side All above 2x10 Supine PROM, jome gentle joint distraction Measured AROM in standing   10/08/2023 THERAPEUTIC ACTIVITIES: To improve functional performance with overhead reach.  Demonstration, verbal and tactile cues throughout for technique. Pulleys - flexion and scaption x 3' each - cues to remain in pain free ROM Inclined L shoulder flexion 2 x 10- cues to avoid painful ROM Inclined L shoulder scaption 2 x 10 - cues to avoid painful ROM  NEUROMUSCULAR RE-EDUCATION: To improve coordination, kinesthesia, posture, proprioception, and amplitude of movement. Seated bent over prone scapular retraction with (cues to avoid shoulder shrug and avoid extension ROM beyond neutral): Row to neutral 2 x 10 Shoulder extension to neutral 2 x 10 L shoulder rhythmic stabilization at 90 flexion x 30 sec with 2-finger perturbation at elbow, 2 x 30 sec with 2-finger perturbation at distal forearm  Supine L shoulder CW/CCW small circles at 90 flexion 2 x 10 each direction (cues to keep circular motion small amplitude) L shoulder YTB reactive isometrics step-outs - neutral shoulder with elbow at 90: Flexion x 10 IR x 10 ER x 5   10/05/2023 THERAPEUTIC ACTIVITIES: To improve functional performance with overhead reach.  Demonstration,  verbal and tactile cues throughout for technique. Pulleys - flexion and scaption x 3' each -  cues to remain in pain free ROM  NEUROMUSCULAR RE-EDUCATION: To improve coordination, kinesthesia, posture, proprioception, and amplitude of movement. Seated bent over prone scapular retraction with (cues to avoid shoulder shrug and avoid extension ROM beyond neutral): Row to neutral 2 x 10 Shoulder extension to neutral 2 x 10 L shoulder rhythmic stabilization at 90 flexion x 30 sec with 2-finger perturbation at elbow, 2 x 30 sec with 2-finger perturbation at distal forearm Supine L shoulder CW/CCW small circles at 90 flexion 2 x 10 each direction (cues to keep circular motion small amplitude) L shoulder YTB reactive isometrics step-outs - neutral shoulder with elbow at 90: Flexion x 10 IR x 10 ER x 5   10/03/2023  THERAPEUTIC ACTIVITY: to improve ability to reach overhead Pulleys - flexion and scaption x 3' each - cues to remain in pain free ROM Supine inclined shoulder AAROM flexion with wand x 10 Supine inclined shoulder AROM flexion 2 x 10 Supine inclined shoulder AROM scaption with wand 2 x 10  NEUROMUSCULAR RE-EDUCATION: To improve coordination, kinesthesia, posture, and proprioception. Standing YTB row 3 x 10 (VC & TC to avoid shoulder shrug) Standing YTB shoulder extension to neutral 3 x 10 Prone scapular retraction with (cues to avoid shoulder shrug): Row to neutral 3 x 10 Shoulder extension to neutral 3 x 10   09/25/2023  THERAPEUTIC EXERCISE: To improve strength, endurance, ROM, and flexibility.  Demonstration, verbal and tactile cues throughout for technique. Pulleys - flexion and scaption x 3' each - cues to remain in pain free ROM Supine L shoulder AROM flexion with elbow flexed (uppercut) 2 x 10 Supine L shoulder AROM scaption with elbow flexed (hitchhiker) 2 x 10  NEUROMUSCULAR RE-EDUCATION: To improve coordination, kinesthesia, posture, and proprioception. Standing YTB  scap retraction + B shoulder row 2 x 10 (VC & TC to avoid shoulder shrug) Standing YTB scap retraction + B shoulder extension to neutral 2 x 10 Prone scapular retraction with (cues to avoid shoulder shrug): Row to neutral 2 x 10 Shoulder extension to neutral 2 x 10 L shoulder rhythmic stabilization at 90 flexion x 30 sec with 2-finger perturbation at elbow, 2 x 30 sec with 2-finger perturbation at distal forearm   PATIENT EDUCATION:  Education details: HEP review, HEP update, HEP consolidation, and continue with current HEP  Person educated: Patient Education method: Explanation, Demonstration, Verbal cues, Tactile cues, and MedBridgeGO app updated Education comprehension: verbalized understanding, returned demonstration, verbal cues required, tactile cues required, and needs further education  HOME EXERCISE PROGRAM: *Pt using MedBridge[employer] app  Access Code: [email or web address] URL: [email or web address] Date: 11/02/2023 Prepared by: Elijah Hidden  Exercises - Seated Shoulder External Rotation AAROM with Cane and Hand in Neutral (Mirrored)  - 1 x daily - 5 x weekly - 2 sets - 10 reps - 3 sec hold - Standing Shoulder Extension ROM with Dowel  - 1 x daily - 5 x weekly - 2 sets - 10 reps - 3 sec hold - Standing Bilateral Shoulder Internal Rotation AAROM with Dowel  - 1 x daily - 5 x weekly - 2 sets - 10 reps - 3 sec hold - Standing Shoulder Internal Rotation AAROM with Dowel (Mirrored)  - 1 x daily - 5 x weekly - 2 sets - 10 reps - 3 sec hold - Seated Shoulder Extension  - 1 x daily - 3 x weekly - 2 sets - 10 reps - 3 sec hold - Seated Bent Over Shoulder Row with Dumbbells  - 1 x  daily - 3 x weekly - 2 sets - 10 reps - 3 sec hold - Active Shoulder Flexion To Each Shelf  - 1 x daily - 3 x weekly - 2 sets - 10 reps - Active Shoulder Scaption To Each Shelf  - 1 x daily - 3 x weekly - 2 sets - 10 reps - Supine Shoulder Circles  - 1 x daily - 3 x weekly - 2 sets - 10 reps - 3 sec hold -  Standing Shoulder Row with Anchored Resistance  - 1 x daily - 3 x weekly - 2 sets - 10 reps - 5 sec hold - Shoulder extension with resistance - Neutral  - 1 x daily - 3 x weekly - 2 sets - 10 reps - 5 sec hold - Shoulder Internal Rotation with Resistance  - 1 x daily - 3 x weekly - 1-2 sets - 10 reps - 3 sec hold - Shoulder External Rotation with Anchored Resistance  - 1 x daily - 3 x weekly - 1-2 sets - 10 reps - 3 sec hold - Standing Single Arm Shoulder Flexion with Posterior Anchored Resistance  - 1 x daily - 3 x weekly - 2 sets - 10 reps - 3 sec hold   ASSESSMENT:  CLINICAL IMPRESSION:  Wilhelm reports no pain at rest and only minimal pain with certain motions, noting discomfort with attempts at bent over shoulder extension this morning.  Taut bands/TPs identified in L posterolateral deltoid which were addressed with MT with good relief noted.  Reviewed recent introduction of gentle shoulder extension and IR providing clarification of movement patterns for wand assisted AAROM.  Given recent transition into phase V of the rehab protocol and continued need for guidance with movement patterns and scapular activation, Verner will benefit from continued skilled PT to address ongoing ROM, glenohumeral coordination, and strength deficits to improve mobility and activity tolerance with decreased pain interference, therefore will recommend recert for additional 1 x/wk for up to 4 weeks.   EVAL: JETSON PICKREL is a 78 y.o. male who was referred to physical therapy for evaluation and treatment s/p L reverse TSA on 08/07/2023.  Patient reports nerve block wore off on POD #2 but pain has been mild (</= 3/10) since. Pain is worse with with perturbation to L UE.  Patient has deficits in L shoulder ROM and flexibility, B shoulder and postural strength, and abnormal posture which are interfering with ADLs and are impacting quality of life.  On QuickDASH patient scored 68.2/100 demonstrating 68.2% disability.   Potential for possible skin breakdown observed in L cubital fossa from moisture trapped while being in sling, therefore pt encouraged to spend a little more time out of the sling while seated to allow for air circulation to skin.  Ayinde will benefit from skilled PT to address above deficits to improve mobility and activity tolerance with decreased pain interference.  OBJECTIVE IMPAIRMENTS: decreased activity tolerance, decreased knowledge of condition, decreased mobility, decreased ROM, decreased strength, increased fascial restrictions, impaired perceived functional ability, increased muscle spasms, impaired flexibility, improper body mechanics, postural dysfunction, and pain.   ACTIVITY LIMITATIONS: carrying, lifting, sleeping, bed mobility, bathing, toileting, dressing, self feeding, reach over head, and caring for others  PARTICIPATION LIMITATIONS: meal prep, cleaning, laundry, driving, and community activity  PERSONAL FACTORS: Age, Fitness, Past/current experiences, Time since onset of injury/illness/exacerbation, and 3+ comorbidities: Aneurysm of ascending aorta, aortic stenosis, B sensorineural hearing loss - extremely HOH, CAD s/p CABG, cardiomyopathy, AVR, chronic anticoagulation, DM-II  with neuropathy, CVA of MCA, HTN, expressive aphasia, gout, lumbar stenosis s/p lumbar laminectomy and decompression 2023, shoulder OA, CKD, ventral hernia, hernia repair, hip surgery, hx multiple strokes (3-4), prostate cancer  are also affecting patient's functional outcome.   REHAB POTENTIAL: Good  CLINICAL DECISION MAKING: Evolving/moderate complexity  EVALUATION COMPLEXITY: Moderate   GOALS: Goals reviewed with patient? Yes  SHORT TERM GOALS: Target date: 09/26/2023  Patient will be independent with initial HEP to improve outcomes and carryover.  Baseline: Initial HEP provided on eval 08/30/23 - reviewed HEP and reinforced only P/AAROM at this phase of rehab protocol 09/14/23 - reviewed HEP and  reinforced only P/AAROM at this phase of rehab protocol Goal status: MET - 09/20/23  2.  Patient will report 25% improvement in L shoulder pain to improve QOL.   Baseline: up to 3/10 Goal status: MET - 09/14/23 - 85% improved   3.  Patient will improve L shoulder flexion and scaption to >/=90 to improve functional use of L UE. Baseline: Refer to above UE ROM table Goal status: MET - 09/06/23 see ROM table  LONG TERM GOALS: Target date: 11/07/2023, extended to 11/30/2023  Patient will be independent with ongoing/advanced HEP for self-management at home.  Baseline:  09/25/23 - HEP updated today 10/05/23 - HEP updated today 10/24/23 - continue with current HEP 10/31/23 - HEP updated Goal status: IN PROGRESS - 11/02/22 - HEP reorganized to split band and weight resisted exercises on alternating days  2.  Patient will report 50-75% improvement in L shoulder pain to improve QOL.  Baseline: up to 3/10 Goal status: MET - 09/14/23 - 85% improved   3.  Patient to demonstrate improved upright posture with posterior shoulder girdle engaged to promote improved glenohumeral joint mobility. Baseline:  Goal status: IN PROGRESS - 11/02/23 - reinforced postural awareness, esp scapular engagement to reduce fwd rounding of shoulders  4.  Patient to improve L shoulder AROM to [facility] without pain provocation to allow for increased ease of ADLs.  Baseline: Refer to above UE ROM table 10/03/23 - L shoulder flexion and scaption/abduction nearing expected outcomes status post reverse TSA Goal status: IN PROGRESS - 11/02/23  5.  Patient will demonstrate improved L shoulder strength to >/= 4/5 for functional UE use. Baseline: Refer to above UE MMT table Goal status: PARTIALLY MET - 10/24/23 - Met for flexion & scaption, ER 4-/5  6  Patient will report </= 50% on QuickDASH  to demonstrate improved functional ability.  Baseline: 68.2 / 100 = 68.2 % Goal status: MET - 10/24/23 - 22.7 / 100 = 22.7 %   PLAN:  PT FREQUENCY:  1x/week   PT DURATION: 4 weeks  PLANNED INTERVENTIONS: 97164- PT Re-evaluation, 97750- Physical Performance Testing, 97110-Therapeutic exercises, 97530- Therapeutic activity, [id number]- Neuromuscular re-education, 97535- Self Care, 02859- Manual therapy, G0283- Electrical stimulation (unattended), 97016- Vasopneumatic device, [id number]- Ultrasound, 02966- Ionotophoresis 4mg /ml Dexamethasone , Patient/Family education, Taping, Joint mobilization, Cryotherapy, and Moist heat  PLAN FOR NEXT SESSION: continue gradual progression per L reverse TSA protocol phase V (12-16 wks) - post-op week #13 as of 11/06/23 (surgery 08/07/23)    Elijah CHRISTELLA Hidden, PT 11/02/2023, 11:11 AM

## 2023-11-07 ENCOUNTER — Ambulatory Visit: Admitting: Internal Medicine

## 2023-11-07 ENCOUNTER — Other Ambulatory Visit (HOSPITAL_BASED_OUTPATIENT_CLINIC_OR_DEPARTMENT_OTHER)

## 2023-11-07 ENCOUNTER — Encounter: Payer: Self-pay | Admitting: Internal Medicine

## 2023-11-07 VITALS — BP 110/66 | HR 63 | Ht 68.5 in | Wt 211.0 lb

## 2023-11-07 DIAGNOSIS — N1831 Chronic kidney disease, stage 3a: Secondary | ICD-10-CM

## 2023-11-07 DIAGNOSIS — E1122 Type 2 diabetes mellitus with diabetic chronic kidney disease: Secondary | ICD-10-CM | POA: Diagnosis not present

## 2023-11-07 DIAGNOSIS — E118 Type 2 diabetes mellitus with unspecified complications: Secondary | ICD-10-CM | POA: Diagnosis not present

## 2023-11-07 DIAGNOSIS — Z794 Long term (current) use of insulin: Secondary | ICD-10-CM

## 2023-11-07 DIAGNOSIS — E1142 Type 2 diabetes mellitus with diabetic polyneuropathy: Secondary | ICD-10-CM | POA: Diagnosis not present

## 2023-11-07 DIAGNOSIS — E1159 Type 2 diabetes mellitus with other circulatory complications: Secondary | ICD-10-CM | POA: Diagnosis not present

## 2023-11-07 LAB — POCT GLYCOSYLATED HEMOGLOBIN (HGB A1C): Hemoglobin A1C: 6.7 % — AB (ref 4.0–5.6)

## 2023-11-07 NOTE — Progress Notes (Signed)
 Name: [name]  Age/ Sex: 78 y.o., male   MRN/ DOB: [id number], 06-Jan-1946     PCP: Daryl Setter, NP   Reason for Endocrinology Evaluation: Type 2 Diabetes Mellitus  Initial Endocrine Consultative Visit: 11/11/2018    PATIENT IDENTIFIER: [name] is a 78 y.o. male with a past medical history of T2DM, OSA, HTN, CAD ( S/P CABG) . The patient has followed with Endocrinology clinic since 11/11/2018 for consultative assistance with management of his diabetes.  DIABETIC HISTORY:  [name] was diagnosed with T2DM in 2005. He was on metformin  which was stopped due to renal dysfunction . Has been on insulin  since 2015. His hemoglobin A1c has ranged from 6.3% in 2015, peaking at 8.3% in 2020.  On his initial visit to our clinic he was on basal insulin  only. With an A1c of 8.3. Trulicity  was added.    Novolog  started 04/2020 due to an A1c 9.6%  Stopped trulicity  due to cost  Started Farxiga  07/2020 through pt assistance program   Started Ozempic  05/2022  Patient met with Leita Constable 03/2023  SUBJECTIVE:   During the last visit (05/04/2023): A1c of 7.8%.    Today (11/07/2023): [name] is here for a  follow up on diabetes management. He is accompanied by his wife today . He checks his blood sugars multiple  times daily, through dexcom.  The patient has not had hypoglycemic episodes since the last clinic visit.  He follows with cardiology for prior aortic valve replacement and thoracic aortic aneurysm monitoring as well as dyslipidemia and A.Fib Who presented to urgent care for right elbow acute gout attack, patient was prescribed prednisone  on 10/18/2023 He was treated for acute cystitis in July, 2025  He is s/p left shoulder surgery in July, 2025 followed by physical therapy, he is recovering well no postop complications  No nausea or vomiting  No constipation nor diarrhea    HOME DIABETES REGIMEN:  Farxiga  10 mg daily Ozempic  2 mg weekly Basaglar  16   units daily  Novolog  16/14/16 units TIDQAC Cf: Novlog (bG-130/25)   CONTINUOUS GLUCOSE MONITORING RECORD INTERPRETATION    Dates of Recording: 9/25-10/08/2023  Sensor description: dexcom  Results statistics:   CGM use % of time 96  Average and SD 144/41  Time in range 79  %  % Time Above 180 19  % Time above 250 1  % Time Below target 1   Glycemic patterns summary: BGs are optimal overnight and fluctuate during the day Hyperglycemic episodes post prandial  Hypoglycemic episodes occurred N/A  Overnight periods: Optimal      DIABETIC COMPLICATIONS: Microvascular complications:  CKDIII- Dr. Gearline  Denies: neuropathy, retinopathy  Last eye exam: Completed 04/09/2023    Macrovascular complications:  CAD (S/P CABG )  Denies: PVD, CVA    HISTORY:  Past Medical History:  Past Medical History:  Diagnosis Date   Actinic keratosis 02/14/2011   Allergy    Aneurysm of ascending aorta without rupture 01/06/2022   Aortic stenosis    Arthritis    Bilateral sensorineural hearing loss 06/16/2009   CAD (coronary artery disease)    cabg   Complication of anesthesia    Coronary atherosclerosis of native coronary artery 06/15/2009   Diabetic polyneuropathy 11/11/2018   Dyslipidemia 11/05/2018   Embolic stroke involving left middle cerebral artery  05/30/2021   Erectile dysfunction 05/18/2009   Essential hypertension 06/16/2009   Expressive aphasia 06/25/2021   External hemorrhoids 03/21/2010   Gout  04/09/2015   Heart murmur    Hemochromatosis 02/14/2011   History of aortic valve replacement 10/13/2009   History of CABG 11/05/2018   2005- 1. Coronary artery bypass grafting x4 with LIMA-LAD, SVG-OM1, SVG-AM and dCFX.  2. Aortic valve replacement with [employer] Jude AVR 3. Septal myomectomy.  4. Myoview low risk 2016   History of hepatitis 01/31/1972   Hypertriglyceridemia 06/13/2010   Hypertrophic obstructive cardiomyopathy 05/06/2008   SP septal myomectomy at surgery 2005 Echo  Nov 2018- Hyperdynamic LVEF with severe basal septal hypertrophy. There is chordal SAM with resting gradient of 16 mmHg that increases to 85 mmHg with Valsalva        Leg weakness, bilateral 01/18/2021   Long term (current) use of anticoagulants 05/22/2018   Lumbar stenosis with neurogenic claudication 02/04/2021   Malignant neoplasm of prostate 06/17/2009   Mechanical heart valve present    Manufacturer: Deitra Rosemary Blade #: [id number]  Model #: [id number]. Card states MRI compatible with 3 teslas or less.   Obesity, unspecified 04/29/2008   OSA (obstructive sleep apnea) 07/12/2012   moderate-severe; uses CPAP nightly   PONV (postoperative nausea and vomiting)    after CABG- slow to wake up   Presence of prosthetic heart valve    Primary osteoarthritis of left shoulder 10/31/2018   S/P lumbar laminectomy 02/04/2021   Stage 3 chronic kidney disease due to type 2 diabetes mellitus 11/11/2018   Tobacco use 06/16/2009   Type 2 diabetes mellitus with hyperglycemia    Ventral hernia 03/03/2014   Past Surgical History:  Past Surgical History:  Procedure Laterality Date   AORTIC VALVE REPLACEMENT  11/14/2003   [facility]   APPENDECTOMY  1990   CHOLECYSTECTOMY  1990   CORONARY ANGIOGRAPHY  10/11/2021   CORONARY ARTERY BYPASS GRAFT  10/2003   HERNIA REPAIR  1999   right, inguinal   HERNIA REPAIR  2002   left, inguinal   HIP SURGERY  2006   right hip   IR CT HEAD LTD  02/03/2022   IR PERCUTANEOUS ART THROMBECTOMY/INFUSION INTRACRANIAL INC DIAG ANGIO  01/24/2022   LUMBAR LAMINECTOMY/DECOMPRESSION MICRODISCECTOMY Bilateral 02/04/2021   Procedure: Bilateral Lumbar Two-Three Laminectomy;  Surgeon: Colon Shove, MD;  Location: [facility] OR;  Service: Neurosurgery;  Laterality: Bilateral;  3C/RM 20   PILONIDAL CYST EXCISION  1964   prostate seed implant  03/2010   RADIOLOGY WITH ANESTHESIA N/A 01/24/2022   Procedure: IR WITH ANESTHESIA;  Surgeon: Radiologist, Medication, MD;  Location: [facility] OR;   Service: Radiology;  Laterality: N/A;   REVERSE SHOULDER ARTHROPLASTY Left 08/07/2023   Procedure: ARTHROPLASTY, SHOULDER, TOTAL, REVERSE;  Surgeon: Josefina Chew, MD;  Location: [facility] ORS;  Service: Orthopedics;  Laterality: Left;   TEE WITHOUT CARDIOVERSION N/A 03/14/2022   Procedure: TRANSESOPHAGEAL ECHOCARDIOGRAM (TEE);  Surgeon: Loni Soyla LABOR, MD;  Location: [facility] ENDOSCOPY;  Service: Cardiology;  Laterality: N/A;   TONSILLECTOMY  childhood   Social History:  reports that he quit smoking about 2 years ago. His smoking use included cigars. He has never used smokeless tobacco. He reports that he does not currently use alcohol. He reports that he does not use drugs. Family History:  Family History  Problem Relation Age of Onset   Heart disease Mother    Stroke Mother    Heart disease Father    Asperger's syndrome Son    Hyperlipidemia Son    Coronary artery disease Brother    Cancer Neg Hx  negative for colon cancer     HOME MEDICATIONS: Allergies as of 11/07/2023   No Known Allergies      Medication List        Accurate as of November 07, 2023 11:42 AM. If you have any questions, ask your nurse or doctor.          AMBULATORY NON FORMULARY MEDICATION cpap cushions            AirFit F20 (Size: Large)           AirFit F30 (Size: Med)   Please FAX the prescription to:  [phone]   aspirin  EC 81 MG tablet Take 1 tablet (81 mg total) by mouth daily. Swallow whole.   Basaglar  KwikPen 100 UNIT/ML INJECT 22 UNITS INTO THE SKIN DAILY. What changed:  how much to take when to take this   BD Pen Needle Nano 2nd Gen 32G X 4 MM Misc Generic drug: Insulin  Pen Needle 1 DEVICE BY DOES NOT APPLY ROUTE IN THE MORNING, AT NOON, IN THE EVENING, AND AT BEDTIME.   Blood Glucose Monitoring Suppl Supplies Misc Use for monitoring glucose level   chlorhexidine  4 % external liquid Commonly known as: HIBICLENS  Apply 15 mLs (1 Application total) topically as directed for 30  doses. Use as directed daily for 5 days every other week for 6 weeks.   dapagliflozin  propanediol 10 MG Tabs tablet Commonly known as: Farxiga  Take 1 tablet (10 mg total) by mouth daily before breakfast.   Dexcom G6 Sensor Misc 1 Device by Does not apply route as directed.   Dexcom G7 Receiver Devi 1 Device by Does not apply route continuous.   enoxaparin  100 MG/ML injection Commonly known as: LOVENOX  Inject 1 mL (100 mg total) into the skin every 12 (twelve) hours.   fenofibrate  145 MG tablet Commonly known as: TRICOR  TAKE 1 TABLET BY MOUTH EVERY DAY   furosemide  40 MG tablet Commonly known as: LASIX  TAKE 1 TABLET BY MOUTH EVERY DAY   isosorbide  mononitrate 30 MG 24 hr tablet Commonly known as: IMDUR  TAKE 1 TABLET BY MOUTH EVERY DAY   ketoconazole 2 % cream Commonly known as: NIZORAL Apply 1 Application topically daily as needed for irritation.   methocarbamol  500 MG tablet Commonly known as: ROBAXIN  Take 1 tablet (500 mg total) by mouth every 8 (eight) hours as needed for muscle spasms.   metoprolol  succinate 25 MG 24 hr tablet Commonly known as: TOPROL -XL TAKE 1 TABLET BY MOUTH EVERY DAY   NovoLOG  FlexPen 100 UNIT/ML FlexPen Generic drug: insulin  aspart Max daily 75 units What changed:  how much to take how to take this when to take this additional instructions   ondansetron  4 MG tablet Commonly known as: Zofran  Take 1 tablet (4 mg total) by mouth every 8 (eight) hours as needed for nausea or vomiting.   OneTouch Delica Lancets 33G Misc USE AS DIRECTED   OneTouch Verio test strip Generic drug: glucose blood Use as instructed   oxyCODONE  5 MG immediate release tablet Commonly known as: Roxicodone  Take 1 tablet (5 mg total) by mouth every 4 (four) hours as needed for severe pain (pain score 7-10).   Ozempic  (2 MG/DOSE) 8 MG/3ML Sopn Generic drug: Semaglutide  (2 MG/DOSE) INJECT 2 MG AS DIRECTED ONCE A WEEK.   predniSONE  10 MG tablet Commonly known  as: DELTASONE  Take 3 tablets (30 mg total) by mouth daily with breakfast.   PRESCRIPTION MEDICATION CPAP- At bedtime   rosuvastatin  40 MG tablet Commonly known  as: CRESTOR  TAKE 1 TABLET BY MOUTH EVERY DAY   sennosides-docusate sodium  8.6-50 MG tablet Commonly known as: SENOKOT-S Take 2 tablets by mouth daily.   tacrolimus  0.1 % ointment Commonly known as: PROTOPIC  Apply 1 application  topically 2 (two) times daily as needed (for eczema).   warfarin 7.5 MG tablet Commonly known as: COUMADIN  Take as directed by the anticoagulation clinic. If you are unsure how to take this medication, talk to your nurse or doctor. Original instructions: TAKE 1/2 TABLET TO 1 TABLET BY MOUTH DAILY AS DIRECTED BY THE COUMADIN  CLINIC What changed: See the new instructions.         OBJECTIVE:   Vital Signs: BP 110/66 (BP Location: Left Arm, Patient Position: Sitting, Cuff Size: Large)   Pulse 63   Ht 5' 8.5 (1.74 m)   Wt 211 lb (95.7 kg)   SpO2 98%   BMI 31.62 kg/m   Wt Readings from Last 3 Encounters:  11/07/23 211 lb (95.7 kg)  08/07/23 202 lb (91.6 kg)  07/26/23 202 lb (91.6 kg)     Exam: General: Pt appears well and is in NAD  Lungs: Clear with good BS bilat   Heart: RRR   Extremities: Trace  pretibial edema.   Neuro: MS is good with appropriate affect, pt is alert and Ox3      DM Foot Exam 05/04/2023  The skin of the feet is intact without sores or ulcerations. The pedal pulses are 1+ on right and 1+ on left. The sensation is decreased  to a screening 5.07, 10 gram monofilament bilaterally    DATA REVIEWED:  Lab Results  Component Value Date   HGBA1C 6.7 (A) 11/07/2023   HGBA1C 6.8 (H) 07/26/2023   HGBA1C 6.5 (A) 05/04/2023    Latest Reference Range & Units 08/08/23 03:37  Sodium 135 - 145 mmol/L 136  Potassium 3.5 - 5.1 mmol/L 3.8  Chloride 98 - 111 mmol/L 104  CO2 22 - 32 mmol/L 22  Glucose 70 - 99 mg/dL 849 (H)  BUN 8 - 23 mg/dL 24 (H)  Creatinine 9.38 -  1.24 mg/dL 9.17  Calcium  8.9 - 10.3 mg/dL 8.3 (L)  Anion gap 5 - 15  10  GFR, Estimated >60 mL/min >60  (H): Data is abnormally high (L): Data is abnormally low  ASSESSMENT / PLAN / RECOMMENDATIONS:   1) Type 2 Diabetes Mellitus, Sub-Optimally  controlled, With CKD III , Neuropathic and Macrovascular complications - Most recent A1c of 6.7 %. Goal A1c < 7.5%.  -A1c remains optimal -Did not qualify for patient assistance for Ozempic  this year - In reviewing CGM data, patient has been noted with tight BG's overnight, will decrease basal insulin  as below, he has also been noted with postprandial hyperglycemia at lunchtime which has been consistent, I will increase NovoLog  for lunch as below -Patient does forget to take his prandial insulin  at times when he is out eating, I have advised the patient that if he forgets to take his prandial dose of insulin  before the meal, he should not take it when he gets home as this would cause hypoglycemia but rather he may use correction scale as needed based on his BG reading.  I also advised the patient to separate NovoLog  doses by 3-4 hours to prevent insulin  stacking which could lead to hypoglycemia  MEDICATIONS: -Continue Ozempic  2 mg once weekly -Continue  Farxiga  10 mg , 1 tablet every morning  - Decrease Basaglar  14 units daily  - Change Novolog   16 units TIDQAC - Continue Correction Scale : Humalog  (BG -130/25)     EDUCATION / INSTRUCTIONS: BG monitoring instructions: Patient is instructed to check his blood sugars 3 times a day, before each meal. Call [facility] if: BG persistently < 70 I reviewed the Rule of 15 for the treatment of hypoglycemia in detail with the patient. Literature supplied.   2) Diabetic complications:  Eye: Does not have known diabetic retinopathy. Neuro/ Feet: Does have known diabetic peripheral neuropathy. Renal: Patient does have known baseline CKD. He is on an ACEI/ARB at present. He sees Dr. Gearline     F/U in 6 months   I spent 25 minutes preparing to see the patient by review of recent labs, imaging and procedures, obtaining and reviewing separately obtained history, communicating with the patient/family or caregiver, ordering medications, tests or procedures, and documenting clinical information in the EHR including the differential Dx, treatment, and any further evaluation and other management   Signed electronically by: Stefano Redgie Butts, MD  [facility]  [facility] [address] [place] [place], [place] [address] Phone: [phone] FAX: [phone]   CC: Daryl Setter, NP [address] [place] [address] Phone: [phone]  Fax: [phone]  Return to Endocrinology clinic as below: Future Appointments  Date Time Provider Department Center  11/12/2023 10:30 AM [facility]  11/14/2023 11:00 AM Gretta Sol CROME, PTA [facility]  11/21/2023 11:00 AM Ronni Elijah HERO, PT [facility]  11/28/2023 11:00 AM Ronni Elijah HERO, PT [facility]  11/30/2023 11:30 AM [facility] COUMADIN  CLINIC 2 [facility]  01/10/2024  1:20 PM Pietro Redell RAMAN, MD [facility]  02/08/2024 11:30 AM Dina Camie BRAVO, PA-C [facility] None

## 2023-11-07 NOTE — Patient Instructions (Addendum)
-   A1c 6.7%  - Continue  Ozempic  2 mg weekly - Continue Farxiga  10 mg , 1 tablet every morning  - Decrease Basaglar  14 units daily  - Change Novolog  to 16 units with Breakfast, 16 units with Lunch and 16 units with Supper   Novolog  correctional insulin : ADD extra units on insulin  to your meal-time Novolog  dose if your blood sugars are higher than 185. Use the scale below to help guide you:   Blood sugar before meal Number of units to inject  Less than 185 0 unit  186 -  210 1 units  211 -  235 2 units  236 -  260 3 units  261 -  285 4 units  286 -  310 5 units  311 -  335 6 units  336 -  360 7 units  361 -  385 8 units  386 - 410  9 units       HOW TO TREAT LOW BLOOD SUGARS (Blood sugar LESS THAN 70 MG/DL) Please follow the RULE OF 15 for the treatment of hypoglycemia treatment (when your (blood sugars are less than 70 mg/dL)   STEP 1: Take 15 grams of carbohydrates when your blood sugar is low, which includes:  3-4 GLUCOSE TABS  OR 3-4 OZ OF JUICE OR REGULAR SODA OR ONE TUBE OF GLUCOSE GEL    STEP 2: RECHECK blood sugar in 15 MINUTES STEP 3: If your blood sugar is still low at the 15 minute recheck --> then, go back to STEP 1 and treat AGAIN with another 15 grams of carbohydrates.

## 2023-11-12 ENCOUNTER — Encounter (HOSPITAL_BASED_OUTPATIENT_CLINIC_OR_DEPARTMENT_OTHER): Payer: Self-pay

## 2023-11-12 ENCOUNTER — Ambulatory Visit (HOSPITAL_BASED_OUTPATIENT_CLINIC_OR_DEPARTMENT_OTHER)
Admission: RE | Admit: 2023-11-12 | Discharge: 2023-11-12 | Disposition: A | Discharge status: Home / Self Care | Source: Ambulatory Visit | Attending: Cardiology | Admitting: Cardiology

## 2023-11-12 DIAGNOSIS — I7121 Aneurysm of the ascending aorta, without rupture: Secondary | ICD-10-CM | POA: Insufficient documentation

## 2023-11-12 DIAGNOSIS — I700 Atherosclerosis of aorta: Secondary | ICD-10-CM | POA: Diagnosis not present

## 2023-11-12 DIAGNOSIS — I2510 Atherosclerotic heart disease of native coronary artery without angina pectoris: Secondary | ICD-10-CM | POA: Diagnosis not present

## 2023-11-12 LAB — POCT I-STAT CREATININE: Creatinine, Ser: 1.5 mg/dL — ABNORMAL HIGH (ref 0.61–1.24)

## 2023-11-12 MED ORDER — IOHEXOL 350 MG/ML SOLN
100.0000 mL | Freq: Once | INTRAVENOUS | Status: AC | PRN
  Administered 2023-11-12: 100 mL via INTRAVENOUS

## 2023-11-14 ENCOUNTER — Ambulatory Visit: Payer: Self-pay | Admitting: Cardiology

## 2023-11-14 ENCOUNTER — Ambulatory Visit

## 2023-11-14 DIAGNOSIS — R293 Abnormal posture: Secondary | ICD-10-CM | POA: Diagnosis not present

## 2023-11-14 DIAGNOSIS — M25612 Stiffness of left shoulder, not elsewhere classified: Secondary | ICD-10-CM

## 2023-11-14 DIAGNOSIS — M25512 Pain in left shoulder: Secondary | ICD-10-CM | POA: Diagnosis not present

## 2023-11-14 DIAGNOSIS — M6281 Muscle weakness (generalized): Secondary | ICD-10-CM

## 2023-11-14 NOTE — Therapy (Signed)
 OUTPATIENT PHYSICAL THERAPY TREATMENT     Patient Name: [name] MRN: [id number] DOB:04-23-1945, 78 y.o., male Today's Date: 11/14/2023   END OF SESSION:  PT End of Session - 11/14/23 1100     Visit Number 17    Date for Recertification  11/30/23    Authorization Type [employer]    Progress Note Due on Visit 24   MD PN for 10/26/23 on visit #14 (10/24/23)   PT Start Time 1100    PT Stop Time 1145    PT Time Calculation (min) 45 min    Activity Tolerance Patient tolerated treatment well    Behavior During Therapy [facility] for tasks assessed/performed            Past Medical History:  Diagnosis Date   Actinic keratosis 02/14/2011   Allergy    Aneurysm of ascending aorta without rupture 01/06/2022   Aortic stenosis    Arthritis    Bilateral sensorineural hearing loss 06/16/2009   CAD (coronary artery disease)    cabg   Complication of anesthesia    Coronary atherosclerosis of native coronary artery 06/15/2009   Diabetic polyneuropathy 11/11/2018   Dyslipidemia 11/05/2018   Embolic stroke involving left middle cerebral artery  05/30/2021   Erectile dysfunction 05/18/2009   Essential hypertension 06/16/2009   Expressive aphasia 06/25/2021   External hemorrhoids 03/21/2010   Gout 04/09/2015   Heart murmur    Hemochromatosis 02/14/2011   History of aortic valve replacement 10/13/2009   History of CABG 11/05/2018   2005- 1. Coronary artery bypass grafting x4 with LIMA-LAD, SVG-OM1, SVG-AM and dCFX.  2. Aortic valve replacement with [employer] AVR 3. Septal myomectomy.  4. Myoview low risk 2016   History of hepatitis 01/31/1972   Hypertriglyceridemia 06/13/2010   Hypertrophic obstructive cardiomyopathy 05/06/2008   SP septal myomectomy at surgery 2005 Echo Nov 2018- Hyperdynamic LVEF with severe basal septal hypertrophy. There is chordal SAM with resting gradient of 16 mmHg that increases to 85 mmHg with Valsalva        Leg weakness, bilateral 01/18/2021   Long term  (current) use of anticoagulants 05/22/2018   Lumbar stenosis with neurogenic claudication 02/04/2021   Malignant neoplasm of prostate 06/17/2009   Mechanical heart valve present    Manufacturer: Deitra Rosemary Blade #: [id number]  Model #: [id number]. Card states MRI compatible with 3 teslas or less.   Obesity, unspecified 04/29/2008   OSA (obstructive sleep apnea) 07/12/2012   moderate-severe; uses CPAP nightly   PONV (postoperative nausea and vomiting)    after CABG- slow to wake up   Presence of prosthetic heart valve    Primary osteoarthritis of left shoulder 10/31/2018   S/P lumbar laminectomy 02/04/2021   Stage 3 chronic kidney disease due to type 2 diabetes mellitus 11/11/2018   Tobacco use 06/16/2009   Type 2 diabetes mellitus with hyperglycemia    Ventral hernia 03/03/2014   Past Surgical History:  Procedure Laterality Date   AORTIC VALVE REPLACEMENT  11/14/2003   [facility]   APPENDECTOMY  1990   CHOLECYSTECTOMY  1990   CORONARY ANGIOGRAPHY  10/11/2021   CORONARY ARTERY BYPASS GRAFT  10/2003   HERNIA REPAIR  1999   right, inguinal   HERNIA REPAIR  2002   left, inguinal   HIP SURGERY  2006   right hip   IR CT HEAD LTD  02/03/2022   IR PERCUTANEOUS ART THROMBECTOMY/INFUSION INTRACRANIAL INC DIAG ANGIO  01/24/2022   LUMBAR LAMINECTOMY/DECOMPRESSION MICRODISCECTOMY Bilateral  02/04/2021   Procedure: Bilateral Lumbar Two-Three Laminectomy;  Surgeon: Colon Shove, MD;  Location: [facility] OR;  Service: Neurosurgery;  Laterality: Bilateral;  3C/RM 20   PILONIDAL CYST EXCISION  1964   prostate seed implant  03/2010   RADIOLOGY WITH ANESTHESIA N/A 01/24/2022   Procedure: IR WITH ANESTHESIA;  Surgeon: Radiologist, Medication, MD;  Location: [facility] OR;  Service: Radiology;  Laterality: N/A;   REVERSE SHOULDER ARTHROPLASTY Left 08/07/2023   Procedure: ARTHROPLASTY, SHOULDER, TOTAL, REVERSE;  Surgeon: Josefina Chew, MD;  Location: [facility] ORS;  Service: Orthopedics;  Laterality: Left;   TEE  WITHOUT CARDIOVERSION N/A 03/14/2022   Procedure: TRANSESOPHAGEAL ECHOCARDIOGRAM (TEE);  Surgeon: Loni Soyla LABOR, MD;  Location: [facility] ENDOSCOPY;  Service: Cardiology;  Laterality: N/A;   TONSILLECTOMY  childhood   Patient Active Problem List   Diagnosis Date Noted   S/P reverse total shoulder arthroplasty, left 08/07/2023   Pre-op evaluation 01/17/2023   Left shoulder pain 11/17/2022   Hypertrophic cardiomyopathy (HCC) 08/12/2022   Chronic anticoagulation 08/12/2022   Hypotension 04/27/2022   H/O: CVA (cerebrovascular accident) 03/14/2022   Urinary retention 01/26/2022   Acute ischemic stroke (HCC) 01/24/2022   Middle cerebral artery embolism, left 01/24/2022   Aneurysm of ascending aorta without rupture 01/06/2022   Type 2 diabetes mellitus with stage 3a chronic kidney disease, with long-term current use of insulin  (HCC) 10/30/2021   Type 2 diabetes mellitus with diabetic polyneuropathy, with long-term current use of insulin  (HCC) 10/30/2021   NSTEMI (non-ST elevated myocardial infarction) (HCC) 10/09/2021   Paroxysmal atrial fibrillation (HCC) 10/09/2021   Presence of prosthetic heart valve    Type 2 diabetes mellitus with hyperglycemia    Expressive aphasia 06/25/2021   History of CVA (cerebrovascular accident) 05/2021   S/P lumbar laminectomy 02/04/2021   Lumbar stenosis with neurogenic claudication 02/04/2021   History of falling 01/30/2021   Diabetic polyneuropathy 11/11/2018   Stage 3 chronic kidney disease due to type 2 diabetes mellitus 11/11/2018   History of CABG 11/05/2018   Dyslipidemia 11/05/2018   Primary osteoarthritis, left shoulder 10/31/2018   Long term (current) use of anticoagulants 05/22/2018   Ventral hernia 03/03/2014   OSA (obstructive sleep apnea) 07/12/2012   Hemochromatosis 02/14/2011   Actinic keratosis 02/14/2011   Hypertriglyceridemia 06/13/2010   External hemorrhoids 03/21/2010   History of aortic valve replacement 10/13/2009   Malignant  neoplasm of prostate (HCC) 06/17/2009   Bilateral sensorineural hearing loss 06/16/2009   Essential hypertension 06/16/2009   Coronary atherosclerosis of native coronary artery 06/15/2009   Erectile dysfunction 05/18/2009   Obesity, unspecified 04/29/2008    PCP: Daryl Setter, NP   REFERRING PROVIDER: Josefina Chew, MD   REFERRING DIAG: [phone] (ICD-10-CM) - Status post reverse total shoulder replacement, left  THERAPY DIAG:  Stiffness of left shoulder, not elsewhere classified  Acute pain of left shoulder  Muscle weakness (generalized)  Abnormal posture  RATIONALE FOR EVALUATION AND TREATMENT: Rehabilitation  ONSET DATE: 08/07/2023 - L reverse TSA  NEXT MD VISIT: 3-6 months   SUBJECTIVE:  SUBJECTIVE STATEMENT:  Patient notes pain superior shoulder when reaching, also reports unable to lie supine with shoulder at side (needs pillow to prop it up)  PAIN: Are you having pain? No and Yes: NPRS scale: 0/10 Pain location: L shoulder  Pain description: ache  Aggravating factors: perturbation to arm  Relieving factors: ice, Tylenol  PRN   PERTINENT HISTORY:  Aneurysm of ascending aorta, aortic stenosis, B sensorineural hearing loss - extremely HOH, CAD s/p CABG, cardiomyopathy, AVR, chronic anticoagulation, DM-II with neuropathy, CVA of MCA, HTN, expressive aphasia, gout, lumbar stenosis s/p lumbar laminectomy and decompression 2023, shoulder OA, CKD, ventral hernia, hernia repair, hip surgery, hx multiple strokes (3-4), prostate cancer   PRECAUTIONS: Shoulder -  Avoid resisted IR/backward extension until 3 months post-op to protect subscapularis repair Progress patient in scapular plane from PROM to AAROM to AROM ensuring proper kinematics throughout care to avoid over stress  to deltoid/deltoid attachments   RED FLAGS: None  HAND DOMINANCE: Right  WEIGHT BEARING RESTRICTIONS: Yes Phase I (1-4 wks): <1 lb; Phase II-IV (4-12 wks): <5 lbs; Phase V (12-16 wks): advance to tolerance; Phase VI (16 wks to 6 mo): Full  FALLS:  Has patient fallen in last 6 months? Yes. Number of falls 1 - fell in garden in May  LIVING ENVIRONMENT:  Lives with: lives with their family Lives in: House/apartment Stairs: No Has following equipment at home: Environmental consultant - 2 wheeled, Crutches, and Grab bars  OCCUPATION: Retired  PLOF: Independent with household mobility without device, Independent with community mobility with device, Needs assistance with ADLs, and Leisure: gardening, 2-3 days/wk at [employer]  PATIENT GOALS: To use normal ROM (for my arm) as much as I can. To not be in pain all the time.   OBJECTIVE: (objective measures completed at initial evaluation unless otherwise dated)  DIAGNOSTIC FINDINGS:  08/07/23 - DG Left Shoulder: CLINICAL DATA:  Status post reverse shoulder arthroplasty.   FINDINGS: Total left shoulder arthroplasty. The arthroplasty components appear intact and in anatomic alignment. There is no acute fracture ordislocation. Postsurgical changes in the soft tissues of the leftshoulder.   IMPRESSION: Total left shoulder arthroplasty.  PATIENT SURVEYS:  Quick Dash: 68.2 / 100 = 68.2 % Minimally Clinically Important Difference (MCID): 15-20 points  COGNITION: Overall cognitive status: Within functional limits for tasks assessed     SENSATION: WFL  POSTURE: rounded shoulders and forward head  UPPER EXTREMITY ROM:   Passive ROM Left eval L 08/30/23 L 09/06/23 L 09/14/23 L 10/03/23  Shoulder flexion 58 90 110 119 120  Shoulder extension       Shoulder abduction 50 scaption 90 scaption 105 117 scaption 128  Shoulder adduction       Shoulder internal rotation       Shoulder external rotation -6 from neutral  24 at ~30 scaption 21 31 at ~30  scaption 30   Active ROM Right  L 10/12/23 standing L 10/17/23 L 10/24/23 L 11/02/23 L 11/14/23  Shoulder flexion  108 110 118 119 120  Shoulder extension     25   Shoulder abduction  102 Scaption- 110 111 scaption 110 scaption 115- scaption  Shoulder adduction        Shoulder internal rotation     74 at ~45 scaption   Shoulder external rotation  40 at 90 degrees abduction  38 at ~30 scaption 49 at ~45 scaption 40 sitting   (Blank rows = not tested)  UPPER EXTREMITY MMT: (deferred on eval d/t only 1 week  postop)  MMT R 10/24/23 L 10/24/23  Shoulder flexion 5 4+  Shoulder extension    Shoulder abduction 5 4+ scaption  Shoulder adduction    Shoulder internal rotation    Shoulder external rotation 4 4-  Middle trapezius    Lower trapezius    (Blank rows = not tested)  JOINT MOBILITY TESTING:  Patient very guarded with initial PROM  PALPATION/OBSERVATION: Bruising draining into upper arm.  Skin at cubital fossa pale and slightly moist/macerated.    TODAY'S TREATMENT:  11/14/23 THERAPEUTIC EXERCISE: To improve endurance, ROM, and flexibility.  Demonstration, verbal and tactile cues throughout for technique.  Pulleys - flexion and scaption x 3' each  Measured L shoulder AROM  NEUROMUSCULAR RE-EDUCATION: To improve coordination, kinesthesia, and posture. Lower trap slides up wall x 10 B Seated bent over L shoulder ext 2lb weight x 10 Seated L shoulder ER sidelying towel at side 2lb x 10  THERAPEUTIC ACTIVITIES: To improve ability to put on belt, wipe when going to bathroom and to reach overhead into cabinets  Demonstration, verbal and tactile cues throughout for technique. Standing L shoulder extension AAROM with cane behind back x 10 Standing B shoulder IR AAROM with cane drawing up back x 10 Standing L shoulder IR AAROM with cane pulling gently to R behind back x 10  Active R/L shoulder flexion cabinet reaches to 1st and 2nd shelves 2# x 10  Active R/L shoulder scaption  cabinet reaches to 1st, 2nd, 3rd shelves 2# x 10   11/02/23 - Recert THERAPEUTIC EXERCISE: To improve endurance, ROM, and flexibility.  Demonstration, verbal and tactile cues throughout for technique.  Pulleys - flexion and scaption x 3' each - cues to remain in pain free ROM Standing L shoulder extension AAROM with cane behind back 2 x 10 Standing B shoulder IR AAROM with cane drawing up back 2 x 10 Standing L shoulder IR AAROM with cane pulling gently to R behind back 2 x 10  MANUAL THERAPY: To promote normalized muscle tension, improved flexibility, increased ROM, and reduced pain utilizing connective tissue massage, therapeutic massage, and manual TP therapy. STM/DTM and manual TPR to L posterolateral deltoid  THERAPEUTIC ACTIVITIES: To improve functional performance.  Demonstration, verbal and tactile cues throughout for technique.  L shoulder ROM assessment Goal assessment  SELF CARE:  Provided education on postural awareness t/o daily routine, including incorporating scapular retraction exercises as postural reminder/checks while at stop lights or watching TV. Verbal review of HEP, with HEP reorganized to split band and weight resisted exercises on alternating days, emphasizing need for rest days between days of strengthening.   10/31/23 THERAPEUTIC EXERCISE: To improve endurance, ROM, and flexibility.  Demonstration, verbal and tactile cues throughout for technique.  Pulleys - flexion and scaption x 3' each - cues to remain in pain free ROM Standing L shoulder extension AAROM with cane behind back x 10 Standing B shoulder IR AAROM with cane drawing up back x 10 Standing L shoulder IR AAROM with cane pulling gently to R behind back x 10   NEUROMUSCULAR RE-EDUCATION: To improve coordination, kinesthesia, and posture. Standing YTB scap retraction + B shoulder row to neutral 10 x 3 - cues to avoid up and backward rotation of elbows, focusing on scap retraction Standing YTB scap  retraction + B shoulder extension to neutral 10 x 3 - cues to focus on scap retraction and avoid pushing excessively into extension Standing YTB L shoulder IR with towel roll under arm to maintain neutral shoulder  10 x 3  Standing YTB scap retraction + L shoulder ER with towel roll under arm to maintain neutral shoulder 10 x 3  Standing YTB L shoulder flexion to 90 x 10  THERAPEUTIC ACTIVITIES: To improve functional performance.  Demonstration, verbal and tactile cues throughout for technique. Active L shoulder flexion cabinet reaches to 1st and 2nd shelves 1# x 10  Active L shoulder scaption cabinet reaches to 1st and 2nd shelves 1# x 10    10/24/23 THERAPEUTIC EXERCISE: To improve strength, endurance, ROM, and flexibility.  Demonstration, verbal and tactile cues throughout for technique.  Pulleys - flexion and scaption x 3' each - cues to remain in pain free ROM Standing L shoulder flexion wall slide x 10 Standing L shoulder scaption wall slide x 10  THERAPEUTIC ACTIVITIES: To improve functional performance.  Demonstration, verbal and tactile cues throughout for technique.  Shoulder ROM assessment UE MMT QuickDASH: 22.7 / 100 = 22.7 % Goal assessment  NEUROMUSCULAR RE-EDUCATION: To improve coordination, kinesthesia, posture, and proprioception.  L shoulder CW/CCW circles with small ball on wall in standing x 15 each direction Standing scap retraction into pool noodle on wall + B shoulder flexion x 10 Standing scap retraction into pool noodle on wall + B shoulder scaption x 10  MANUAL THERAPY: To promote normalized muscle tension, improved flexibility, improved joint mobility, and increased ROM utilizing connective tissue massage, therapeutic massage, and manual TP therapy.  STM/DTM and manual TPR to L pecs (esp pec minor), infraspinatus and teres group   10/17/23 Pulleys 3 min flexion and scaption All exercises with L UE: Counter slides into flexion x 20, scaption x 20 w/ 1lb  weight Counter CW/CCW circles x 20 each way into flexion Wall slide flexion x 10 Serratus punch 3lb x 20 CW/CCW circles supine 3lb x 10 each way STM to L anterior deltoid MWM into shoulder flexion ER PROM supine  Standing AAROM flexion with cane x 10; scaption x 10    10/15/23 THERAPEUTIC EXERCISE: To improve ROM.  Demonstration, verbal and tactile cues throughout for technique. Pulleys flexion x 2'; scaption x 2' Supine PROM L shoulder   THERAPEUTIC ACTIVITIES: To improve functional performance.  Demonstration, verbal and tactile cues throughout for technique. Supine 3# serratus press up x 20 LUE Supine 3# small circles at 90 CW x 10; CCW x 10 with manual PT assist Standing counter dust small circles CW x 20; CCW x 20 within precautionary ROM with PT assisting manually Standing shoulder scaption in  L-bar position with cane x 20 LUE with mirror to prevent shoulder hiking Standing wall dust scaption x 20 LUE Standing ER 1# x 30 LUE with PT manual assist for correct form Standing shoulder rows RTB x 30 BUE w/ PT assist to prevent extension past body Standing shoulder ext RTB x 30 BUE w/ PT assist to prevent extension past body Standing cane ER stretch x 1' x 2   10/12/23 Pulleys Flexion and scaption Reviewed and altered his HEP as he requested many changes Issued red tband Supine 3# punches with PT limiting motion Supine 3# serratus punches Supine 3# isometric circles Supine 2# flexion from 90 degrees to his tolerance Supine 1# ER from belly with elbow at 60 degrees abduction Right sidelying 1# ER holding elbow at side All above 2x10 Supine PROM, jome gentle joint distraction Measured AROM in standing   10/08/2023 THERAPEUTIC ACTIVITIES: To improve functional performance with overhead reach.  Demonstration, verbal and tactile cues throughout for technique. Pulleys -  flexion and scaption x 3' each - cues to remain in pain free ROM Inclined L shoulder flexion 2 x 10- cues to  avoid painful ROM Inclined L shoulder scaption 2 x 10 - cues to avoid painful ROM  NEUROMUSCULAR RE-EDUCATION: To improve coordination, kinesthesia, posture, proprioception, and amplitude of movement. Seated bent over prone scapular retraction with (cues to avoid shoulder shrug and avoid extension ROM beyond neutral): Row to neutral 2 x 10 Shoulder extension to neutral 2 x 10 L shoulder rhythmic stabilization at 90 flexion x 30 sec with 2-finger perturbation at elbow, 2 x 30 sec with 2-finger perturbation at distal forearm  Supine L shoulder CW/CCW small circles at 90 flexion 2 x 10 each direction (cues to keep circular motion small amplitude) L shoulder YTB reactive isometrics step-outs - neutral shoulder with elbow at 90: Flexion x 10 IR x 10 ER x 5   10/05/2023 THERAPEUTIC ACTIVITIES: To improve functional performance with overhead reach.  Demonstration, verbal and tactile cues throughout for technique. Pulleys - flexion and scaption x 3' each - cues to remain in pain free ROM  NEUROMUSCULAR RE-EDUCATION: To improve coordination, kinesthesia, posture, proprioception, and amplitude of movement. Seated bent over prone scapular retraction with (cues to avoid shoulder shrug and avoid extension ROM beyond neutral): Row to neutral 2 x 10 Shoulder extension to neutral 2 x 10 L shoulder rhythmic stabilization at 90 flexion x 30 sec with 2-finger perturbation at elbow, 2 x 30 sec with 2-finger perturbation at distal forearm Supine L shoulder CW/CCW small circles at 90 flexion 2 x 10 each direction (cues to keep circular motion small amplitude) L shoulder YTB reactive isometrics step-outs - neutral shoulder with elbow at 90: Flexion x 10 IR x 10 ER x 5   10/03/2023  THERAPEUTIC ACTIVITY: to improve ability to reach overhead Pulleys - flexion and scaption x 3' each - cues to remain in pain free ROM Supine inclined shoulder AAROM flexion with wand x 10 Supine inclined shoulder AROM  flexion 2 x 10 Supine inclined shoulder AROM scaption with wand 2 x 10  NEUROMUSCULAR RE-EDUCATION: To improve coordination, kinesthesia, posture, and proprioception. Standing YTB row 3 x 10 (VC & TC to avoid shoulder shrug) Standing YTB shoulder extension to neutral 3 x 10 Prone scapular retraction with (cues to avoid shoulder shrug): Row to neutral 3 x 10 Shoulder extension to neutral 3 x 10   09/25/2023  THERAPEUTIC EXERCISE: To improve strength, endurance, ROM, and flexibility.  Demonstration, verbal and tactile cues throughout for technique. Pulleys - flexion and scaption x 3' each - cues to remain in pain free ROM Supine L shoulder AROM flexion with elbow flexed (uppercut) 2 x 10 Supine L shoulder AROM scaption with elbow flexed (hitchhiker) 2 x 10  NEUROMUSCULAR RE-EDUCATION: To improve coordination, kinesthesia, posture, and proprioception. Standing YTB scap retraction + B shoulder row 2 x 10 (VC & TC to avoid shoulder shrug) Standing YTB scap retraction + B shoulder extension to neutral 2 x 10 Prone scapular retraction with (cues to avoid shoulder shrug): Row to neutral 2 x 10 Shoulder extension to neutral 2 x 10 L shoulder rhythmic stabilization at 90 flexion x 30 sec with 2-finger perturbation at elbow, 2 x 30 sec with 2-finger perturbation at distal forearm   PATIENT EDUCATION:  Education details: HEP review, HEP update, HEP consolidation, and continue with current HEP  Person educated: Patient Education method: Explanation, Demonstration, Verbal cues, Tactile cues, and MedBridge[employer] app updated Education comprehension: verbalized understanding,  returned demonstration, verbal cues required, tactile cues required, and needs further education  HOME EXERCISE PROGRAM: *Pt using MedBridge[employer] app  Access Code: [email or web address] URL: [email or web address] Date: 11/14/2023 Prepared by: Sol Gaskins  Exercises - Seated Shoulder External Rotation AAROM with Cane and Hand  in Neutral (Mirrored)  - 1 x daily - 5 x weekly - 2 sets - 10 reps - 3 sec hold - Standing Shoulder Extension ROM with Dowel  - 1 x daily - 5 x weekly - 2 sets - 10 reps - 3 sec hold - Standing Bilateral Shoulder Internal Rotation AAROM with Dowel  - 1 x daily - 5 x weekly - 2 sets - 10 reps - 3 sec hold - Standing Shoulder Internal Rotation AAROM with Dowel (Mirrored)  - 1 x daily - 5 x weekly - 2 sets - 10 reps - 3 sec hold - Seated Shoulder Extension  - 1 x daily - 3 x weekly - 2 sets - 10 reps - 3 sec hold - Seated Bent Over Shoulder Row with Dumbbells  - 1 x daily - 3 x weekly - 2 sets - 10 reps - 3 sec hold - Active Shoulder Flexion To Each Shelf  - 1 x daily - 3 x weekly - 2 sets - 10 reps - Active Shoulder Scaption To Each Shelf  - 1 x daily - 3 x weekly - 2 sets - 10 reps - Standing Shoulder Row with Anchored Resistance  - 1 x daily - 3 x weekly - 2 sets - 10 reps - 5 sec hold - Shoulder extension with resistance - Neutral  - 1 x daily - 3 x weekly - 2 sets - 10 reps - 5 sec hold - Shoulder Internal Rotation with Resistance  - 1 x daily - 3 x weekly - 1-2 sets - 10 reps - 3 sec hold - Shoulder External Rotation with Anchored Resistance  - 1 x daily - 3 x weekly - 1-2 sets - 10 reps - 3 sec hold - Standing Single Arm Shoulder Flexion with Posterior Anchored Resistance  - 1 x daily - 3 x weekly - 2 sets - 10 reps - 3 sec hold - Low Trap Setting at Wall  - 1 x daily - 3 x weekly - 2 sets - 10 reps   ASSESSMENT:  CLINICAL IMPRESSION:  [name] reports no pain at rest and only minimal pain with certain motions, and with lying supine with L shoulder extended past neutral. Continued scapular stabilizer strength and functional shoulder strength. Pt showing good ROM overhead, ER shows to be a little limited. Overall he is doing well. [name] will benefit from continued skilled PT to address ongoing ROM, glenohumeral coordination, and strength deficits to improve mobility and activity tolerance with  decreased pain interference.  EVAL: [name] is a 79 y.o. male who was referred to physical therapy for evaluation and treatment s/p L reverse TSA on 08/07/2023.  Patient reports nerve block wore off on POD #2 but pain has been mild (</= 3/10) since. Pain is worse with with perturbation to L UE.  Patient has deficits in L shoulder ROM and flexibility, B shoulder and postural strength, and abnormal posture which are interfering with ADLs and are impacting quality of life.  On QuickDASH patient scored 68.2/100 demonstrating 68.2% disability.  Potential for possible skin breakdown observed in L cubital fossa from moisture trapped while being in sling, therefore pt encouraged to spend a little more time out of the sling  while seated to allow for air circulation to skin.  C[place]ell will benefit from skilled PT to address above deficits to improve mobility and activity tolerance with decreased pain interference.  OBJECTIVE IMPAIRMENTS: decreased activity tolerance, decreased knowledge of condition, decreased mobility, decreased ROM, decreased strength, increased fascial restrictions, impaired perceived functional ability, increased muscle spasms, impaired flexibility, improper body mechanics, postural dysfunction, and pain.   ACTIVITY LIMITATIONS: carrying, lifting, sleeping, bed mobility, bathing, toileting, dressing, self feeding, reach over head, and caring for others  PARTICIPATION LIMITATIONS: meal prep, cleaning, laundry, driving, and community activity  PERSONAL FACTORS: Age, Fitness, Past/current experiences, Time since onset of injury/illness/exacerbation, and 3+ comorbidities: Aneurysm of ascending aorta, aortic stenosis, B sensorineural hearing loss - extremely HOH, CAD s/p CABG, cardiomyopathy, AVR, chronic anticoagulation, DM-II with neuropathy, CVA of MCA, HTN, expressive aphasia, gout, lumbar stenosis s/p lumbar laminectomy and decompression 2023, shoulder OA, CKD, ventral hernia, hernia  repair, hip surgery, hx multiple strokes (3-4), prostate cancer  are also affecting patient's functional outcome.   REHAB POTENTIAL: Good  CLINICAL DECISION MAKING: Evolving/moderate complexity  EVALUATION COMPLEXITY: Moderate   GOALS: Goals reviewed with patient? Yes  SHORT TERM GOALS: Target date: 09/26/2023  Patient will be independent with initial HEP to improve outcomes and carryover.  Baseline: Initial HEP provided on eval 08/30/23 - reviewed HEP and reinforced only P/AAROM at this phase of rehab protocol 09/14/23 - reviewed HEP and reinforced only P/AAROM at this phase of rehab protocol Goal status: MET - 09/20/23  2.  Patient will report 25% improvement in L shoulder pain to improve QOL.   Baseline: up to 3/10 Goal status: MET - 09/14/23 - 85% improved   3.  Patient will improve L shoulder flexion and scaption to >/=90 to improve functional use of L UE. Baseline: Refer to above UE ROM table Goal status: MET - 09/06/23 see ROM table  LONG TERM GOALS: Target date: 11/07/2023, extended to 11/30/2023  Patient will be independent with ongoing/advanced HEP for self-management at home.  Baseline:  09/25/23 - HEP updated today 10/05/23 - HEP updated today 10/24/23 - continue with current HEP 10/31/23 - HEP updated Goal status: IN PROGRESS - 11/02/22 - HEP reorganized to split band and weight resisted exercises on alternating days  2.  Patient will report 50-75% improvement in L shoulder pain to improve QOL.  Baseline: up to 3/10 Goal status: MET - 09/14/23 - 85% improved   3.  Patient to demonstrate improved upright posture with posterior shoulder girdle engaged to promote improved glenohumeral joint mobility. Baseline:  Goal status: IN PROGRESS - 11/02/23 - reinforced postural awareness, esp scapular engagement to reduce fwd rounding of shoulders  4.  Patient to improve L shoulder AROM to [facility] without pain provocation to allow for increased ease of ADLs.  Baseline: Refer to above UE  ROM table 10/03/23 - L shoulder flexion and scaption/abduction nearing expected outcomes status post reverse TSA Goal status: IN PROGRESS - 11/14/23  5.  Patient will demonstrate improved L shoulder strength to >/= 4/5 for functional UE use. Baseline: Refer to above UE MMT table Goal status: PARTIALLY MET - 10/24/23 - Met for flexion & scaption, ER 4-/5  6  Patient will report </= 50% on [employer]  to demonstrate improved functional ability.  Baseline: 68.2 / 100 = 68.2 % Goal status: MET - 10/24/23 - 22.7 / 100 = 22.7 %   PLAN:  PT FREQUENCY: 1x/week   PT DURATION: 4 weeks  PLANNED INTERVENTIONS: 02835- PT Re-evaluation, 97750- Physical  Performance Testing, 97110-Therapeutic exercises, 97530- Therapeutic activity, [id number]- Neuromuscular re-education, [id number]- Self Care, 02859- Manual therapy, G0283- Electrical stimulation (unattended), 97016- Vasopneumatic device, [id number]- Ultrasound, [id number]- Ionotophoresis 4mg /ml Dexamethasone , Patient/Family education, Taping, Joint mobilization, Cryotherapy, and Moist heat  PLAN FOR NEXT SESSION: continue gradual progression per L reverse TSA protocol phase V (12-16 wks) - post-op week #14 as of 11/13/23 (surgery 08/07/23)    Sol LITTIE Gaskins, PTA 11/14/2023, 11:46 AM

## 2023-11-19 DIAGNOSIS — N1832 Chronic kidney disease, stage 3b: Secondary | ICD-10-CM | POA: Diagnosis not present

## 2023-11-21 ENCOUNTER — Ambulatory Visit: Admitting: Physical Therapy

## 2023-11-21 ENCOUNTER — Encounter: Payer: Self-pay | Admitting: Physical Therapy

## 2023-11-21 DIAGNOSIS — M6281 Muscle weakness (generalized): Secondary | ICD-10-CM | POA: Diagnosis not present

## 2023-11-21 DIAGNOSIS — R293 Abnormal posture: Secondary | ICD-10-CM | POA: Diagnosis not present

## 2023-11-21 DIAGNOSIS — M25612 Stiffness of left shoulder, not elsewhere classified: Secondary | ICD-10-CM | POA: Diagnosis not present

## 2023-11-21 DIAGNOSIS — M25512 Pain in left shoulder: Secondary | ICD-10-CM

## 2023-11-21 NOTE — Therapy (Addendum)
 OUTPATIENT PHYSICAL THERAPY TREATMENT     Patient Name: [name] MRN: [id number] DOB:04/13/1945, 78 y.o., male Today's Date: 11/21/2023   END OF SESSION:  PT End of Session - 11/21/23 1102     Visit Number 18    Date for Recertification  11/30/23    Authorization Type [employer]    Progress Note Due on Visit 24   MD PN for 10/26/23 on visit #14 (10/24/23)   PT Start Time 1102    PT Stop Time 1143    PT Time Calculation (min) 41 min    Activity Tolerance Patient tolerated treatment well    Behavior During Therapy [facility] for tasks assessed/performed             Past Medical History:  Diagnosis Date   Actinic keratosis 02/14/2011   Allergy    Aneurysm of ascending aorta without rupture 01/06/2022   Aortic stenosis    Arthritis    Bilateral sensorineural hearing loss 06/16/2009   CAD (coronary artery disease)    cabg   Complication of anesthesia    Coronary atherosclerosis of native coronary artery 06/15/2009   Diabetic polyneuropathy 11/11/2018   Dyslipidemia 11/05/2018   Embolic stroke involving left middle cerebral artery  05/30/2021   Erectile dysfunction 05/18/2009   Essential hypertension 06/16/2009   Expressive aphasia 06/25/2021   External hemorrhoids 03/21/2010   Gout 04/09/2015   Heart murmur    Hemochromatosis 02/14/2011   History of aortic valve replacement 10/13/2009   History of CABG 11/05/2018   2005- 1. Coronary artery bypass grafting x4 with LIMA-LAD, SVG-OM1, SVG-AM and dCFX.  2. Aortic valve replacement with [employer] Jude AVR 3. Septal myomectomy.  4. Myoview low risk 2016   History of hepatitis 01/31/1972   Hypertriglyceridemia 06/13/2010   Hypertrophic obstructive cardiomyopathy 05/06/2008   SP septal myomectomy at surgery 2005 Echo Nov 2018- Hyperdynamic LVEF with severe basal septal hypertrophy. There is chordal SAM with resting gradient of 16 mmHg that increases to 85 mmHg with Valsalva        Leg weakness, bilateral 01/18/2021   Long  term (current) use of anticoagulants 05/22/2018   Lumbar stenosis with neurogenic claudication 02/04/2021   Malignant neoplasm of prostate 06/17/2009   Mechanical heart valve present    Manufacturer: Deitra Rosemary Blade #: [id number]  Model #: [id number]. Card states MRI compatible with 3 teslas or less.   Obesity, unspecified 04/29/2008   OSA (obstructive sleep apnea) 07/12/2012   moderate-severe; uses CPAP nightly   PONV (postoperative nausea and vomiting)    after CABG- slow to wake up   Presence of prosthetic heart valve    Primary osteoarthritis of left shoulder 10/31/2018   S/P lumbar laminectomy 02/04/2021   Stage 3 chronic kidney disease due to type 2 diabetes mellitus 11/11/2018   Tobacco use 06/16/2009   Type 2 diabetes mellitus with hyperglycemia    Ventral hernia 03/03/2014   Past Surgical History:  Procedure Laterality Date   AORTIC VALVE REPLACEMENT  11/14/2003   [facility]   APPENDECTOMY  1990   CHOLECYSTECTOMY  1990   CORONARY ANGIOGRAPHY  10/11/2021   CORONARY ARTERY BYPASS GRAFT  10/2003   HERNIA REPAIR  1999   right, inguinal   HERNIA REPAIR  2002   left, inguinal   HIP SURGERY  2006   right hip   IR CT HEAD LTD  02/03/2022   IR PERCUTANEOUS ART THROMBECTOMY/INFUSION INTRACRANIAL INC DIAG ANGIO  01/24/2022   LUMBAR LAMINECTOMY/DECOMPRESSION MICRODISCECTOMY  Bilateral 02/04/2021   Procedure: Bilateral Lumbar Two-Three Laminectomy;  Surgeon: Colon Shove, MD;  Location: [facility] OR;  Service: Neurosurgery;  Laterality: Bilateral;  3C/RM 20   PILONIDAL CYST EXCISION  1964   prostate seed implant  03/2010   RADIOLOGY WITH ANESTHESIA N/A 01/24/2022   Procedure: IR WITH ANESTHESIA;  Surgeon: Radiologist, Medication, MD;  Location: [facility] OR;  Service: Radiology;  Laterality: N/A;   REVERSE SHOULDER ARTHROPLASTY Left 08/07/2023   Procedure: ARTHROPLASTY, SHOULDER, TOTAL, REVERSE;  Surgeon: Josefina Chew, MD;  Location: [facility] ORS;  Service: Orthopedics;  Laterality: Left;   TEE  WITHOUT CARDIOVERSION N/A 03/14/2022   Procedure: TRANSESOPHAGEAL ECHOCARDIOGRAM (TEE);  Surgeon: Loni Soyla LABOR, MD;  Location: [facility] ENDOSCOPY;  Service: Cardiology;  Laterality: N/A;   TONSILLECTOMY  childhood   Patient Active Problem List   Diagnosis Date Noted   S/P reverse total shoulder arthroplasty, left 08/07/2023   Pre-op evaluation 01/17/2023   Left shoulder pain 11/17/2022   Hypertrophic cardiomyopathy (HCC) 08/12/2022   Chronic anticoagulation 08/12/2022   Hypotension 04/27/2022   H/O: CVA (cerebrovascular accident) 03/14/2022   Urinary retention 01/26/2022   Acute ischemic stroke (HCC) 01/24/2022   Middle cerebral artery embolism, left 01/24/2022   Aneurysm of ascending aorta without rupture 01/06/2022   Type 2 diabetes mellitus with stage 3a chronic kidney disease, with long-term current use of insulin  (HCC) 10/30/2021   Type 2 diabetes mellitus with diabetic polyneuropathy, with long-term current use of insulin  (HCC) 10/30/2021   NSTEMI (non-ST elevated myocardial infarction) (HCC) 10/09/2021   Paroxysmal atrial fibrillation (HCC) 10/09/2021   Presence of prosthetic heart valve    Type 2 diabetes mellitus with hyperglycemia    Expressive aphasia 06/25/2021   History of CVA (cerebrovascular accident) 05/2021   S/P lumbar laminectomy 02/04/2021   Lumbar stenosis with neurogenic claudication 02/04/2021   History of falling 01/30/2021   Diabetic polyneuropathy 11/11/2018   Stage 3 chronic kidney disease due to type 2 diabetes mellitus 11/11/2018   History of CABG 11/05/2018   Dyslipidemia 11/05/2018   Primary osteoarthritis, left shoulder 10/31/2018   Long term (current) use of anticoagulants 05/22/2018   Ventral hernia 03/03/2014   OSA (obstructive sleep apnea) 07/12/2012   Hemochromatosis 02/14/2011   Actinic keratosis 02/14/2011   Hypertriglyceridemia 06/13/2010   External hemorrhoids 03/21/2010   History of aortic valve replacement 10/13/2009   Malignant  neoplasm of prostate (HCC) 06/17/2009   Bilateral sensorineural hearing loss 06/16/2009   Essential hypertension 06/16/2009   Coronary atherosclerosis of native coronary artery 06/15/2009   Erectile dysfunction 05/18/2009   Obesity, unspecified 04/29/2008    PCP: Daryl Setter, NP   REFERRING PROVIDER: Josefina Chew, MD   REFERRING DIAG: [phone] (ICD-10-CM) - Status post reverse total shoulder replacement, left  THERAPY DIAG:  Stiffness of left shoulder, not elsewhere classified  Acute pain of left shoulder  Muscle weakness (generalized)  Abnormal posture  RATIONALE FOR EVALUATION AND TREATMENT: Rehabilitation  ONSET DATE: 08/07/2023 - L reverse TSA  NEXT MD VISIT: 3-6 months   SUBJECTIVE:  SUBJECTIVE STATEMENT:  Patient reports he is pleased with his ROM but still notes superior shoulder discomfort with OH reaching.  Starting to wean pillow prop/support when sleeping but this is sometimes uncomfortable.    PAIN: Are you having pain? No and Yes: NPRS scale: 0/10 Pain location: L shoulder  Pain description: ache  Aggravating factors: perturbation to arm  Relieving factors: ice, Tylenol  PRN   PERTINENT HISTORY:  Aneurysm of ascending aorta, aortic stenosis, B sensorineural hearing loss - extremely HOH, CAD s/p CABG, cardiomyopathy, AVR, chronic anticoagulation, DM-II with neuropathy, CVA of MCA, HTN, expressive aphasia, gout, lumbar stenosis s/p lumbar laminectomy and decompression 2023, shoulder OA, CKD, ventral hernia, hernia repair, hip surgery, hx multiple strokes (3-4), prostate cancer   PRECAUTIONS: Shoulder -  Avoid resisted IR/backward extension until 3 months post-op to protect subscapularis repair Progress patient in scapular plane from PROM to AAROM to AROM  ensuring proper kinematics throughout care to avoid over stress to deltoid/deltoid attachments   RED FLAGS: None  HAND DOMINANCE: Right  WEIGHT BEARING RESTRICTIONS: Yes Phase I (1-4 wks): <1 lb; Phase II-IV (4-12 wks): <5 lbs; Phase V (12-16 wks): advance to tolerance; Phase VI (16 wks to 6 mo): Full  FALLS:  Has patient fallen in last 6 months? Yes. Number of falls 1 - fell in garden in May  LIVING ENVIRONMENT:  Lives with: lives with their family Lives in: House/apartment Stairs: No Has following equipment at home: Environmental Consultant - 2 wheeled, Crutches, and Grab bars  OCCUPATION: Retired  PLOF: Independent with household mobility without device, Independent with community mobility with device, Needs assistance with ADLs, and Leisure: gardening, 2-3 days/wk at [employer]  PATIENT GOALS: To use normal ROM (for my arm) as much as I can. To not be in pain all the time.   OBJECTIVE: (objective measures completed at initial evaluation unless otherwise dated)  DIAGNOSTIC FINDINGS:  08/07/23 - DG Left Shoulder: CLINICAL DATA:  Status post reverse shoulder arthroplasty.   FINDINGS: Total left shoulder arthroplasty. The arthroplasty components appear intact and in anatomic alignment. There is no acute fracture ordislocation. Postsurgical changes in the soft tissues of the leftshoulder.   IMPRESSION: Total left shoulder arthroplasty.  PATIENT SURVEYS:  Quick Dash: 68.2 / 100 = 68.2 % Minimally Clinically Important Difference (MCID): 15-20 points  COGNITION: Overall cognitive status: Within functional limits for tasks assessed     SENSATION: WFL  POSTURE: rounded shoulders and forward head  UPPER EXTREMITY ROM:   Passive ROM Left eval L 08/30/23 L 09/06/23 L 09/14/23 L 10/03/23  Shoulder flexion 58 90 110 119 120  Shoulder extension       Shoulder abduction 50 scaption 90 scaption 105 117 scaption 128  Shoulder adduction       Shoulder internal rotation       Shoulder external  rotation -6 from neutral  24 at ~30 scaption 21 31 at ~30 scaption 30   Active ROM Right  L 10/12/23 standing L 10/17/23 L 10/24/23 L 11/02/23 L 11/14/23  Shoulder flexion  108 110 118 119 120  Shoulder extension     25   Shoulder abduction  102 Scaption- 110 111 scaption 110 scaption 115- scaption  Shoulder adduction        Shoulder internal rotation     74 at ~45 scaption   Shoulder external rotation  40 at 90 degrees abduction  38 at ~30 scaption 49 at ~45 scaption 40 sitting   (Blank rows = not tested)  UPPER  EXTREMITY MMT: (deferred on eval d/t only 1 week postop)  MMT R 10/24/23 L 10/24/23  Shoulder flexion 5 4+  Shoulder extension    Shoulder abduction 5 4+ scaption  Shoulder adduction    Shoulder internal rotation    Shoulder external rotation 4 4-  Middle trapezius    Lower trapezius    (Blank rows = not tested)  JOINT MOBILITY TESTING:  Patient very guarded with initial PROM  PALPATION/OBSERVATION: Bruising draining into upper arm.  Skin at cubital fossa pale and slightly moist/macerated.    TODAY'S TREATMENT:   11/21/2023 THERAPEUTIC EXERCISE: To improve strength, endurance, ROM, and flexibility.  Demonstration, verbal and tactile cues throughout for technique.  UBE - L1.0 x 3' each forward and backward Standing L low pec stretch at doorframe 3 x 30 - cues for small step for gentle stretch Standing L posterior capsule stretch with hand on doorframe 3 x 30 - cues for gentle stretch  THERAPEUTIC ACTIVITIES: To improve comfort with shoulder resting on bed while sleeping as well as ability to put on belt and perform personal hygiene when going to bathroom. Demonstration, verbal and tactile cues throughout for technique. Standing L shoulder extension AAROM with cane behind back x 10 Standing B shoulder IR AAROM with cane drawing up back x 10 Standing L shoulder IR AAROM with cane pulling gently to R behind back x 10 - clarified that it is ok to allow elbow to bend  behind back  NEUROMUSCULAR RE-EDUCATION: To improve coordination, kinesthesia, posture, proprioception, and to improve scapulohumeral control. B lower trap slides up wall x 10 - cues to press ulnar side of hand into wall during wall slide B serratus roll-up on FR on wall x 10 Serratus 3-way wall clocks alternating L/R x 5   11/14/23 THERAPEUTIC EXERCISE: To improve endurance, ROM, and flexibility.  Demonstration, verbal and tactile cues throughout for technique.  Pulleys - flexion and scaption x 3' each  Measured L shoulder AROM  NEUROMUSCULAR RE-EDUCATION: To improve coordination, kinesthesia, and posture. Lower trap slides up wall x 10 B Seated bent over L shoulder ext 2lb weight x 10 Seated L shoulder ER sidelying towel at side 2lb x 10  THERAPEUTIC ACTIVITIES: To improve ability to put on belt, wipe when going to bathroom and to reach overhead into cabinets  Demonstration, verbal and tactile cues throughout for technique. Standing L shoulder extension AAROM with cane behind back x 10 Standing B shoulder IR AAROM with cane drawing up back x 10 Standing L shoulder IR AAROM with cane pulling gently to R behind back x 10  Active R/L shoulder flexion cabinet reaches to 1st and 2nd shelves 2# x 10  Active R/L shoulder scaption cabinet reaches to 1st, 2nd, 3rd shelves 2# x 10    11/02/23 - Recert THERAPEUTIC EXERCISE: To improve endurance, ROM, and flexibility.  Demonstration, verbal and tactile cues throughout for technique.  Pulleys - flexion and scaption x 3' each - cues to remain in pain free ROM Standing L shoulder extension AAROM with cane behind back 2 x 10 Standing B shoulder IR AAROM with cane drawing up back 2 x 10 Standing L shoulder IR AAROM with cane pulling gently to R behind back 2 x 10  MANUAL THERAPY: To promote normalized muscle tension, improved flexibility, increased ROM, and reduced pain utilizing connective tissue massage, therapeutic massage, and manual TP  therapy. STM/DTM and manual TPR to L posterolateral deltoid  THERAPEUTIC ACTIVITIES: To improve functional performance.  Demonstration, verbal and  tactile cues throughout for technique.  L shoulder ROM assessment Goal assessment  SELF CARE:  Provided education on postural awareness t/o daily routine, including incorporating scapular retraction exercises as postural reminder/checks while at stop lights or watching TV. Verbal review of HEP, with HEP reorganized to split band and weight resisted exercises on alternating days, emphasizing need for rest days between days of strengthening.   10/31/23 THERAPEUTIC EXERCISE: To improve endurance, ROM, and flexibility.  Demonstration, verbal and tactile cues throughout for technique.  Pulleys - flexion and scaption x 3' each - cues to remain in pain free ROM Standing L shoulder extension AAROM with cane behind back x 10 Standing B shoulder IR AAROM with cane drawing up back x 10 Standing L shoulder IR AAROM with cane pulling gently to R behind back x 10   NEUROMUSCULAR RE-EDUCATION: To improve coordination, kinesthesia, and posture. Standing YTB scap retraction + B shoulder row to neutral 10 x 3 - cues to avoid up and backward rotation of elbows, focusing on scap retraction Standing YTB scap retraction + B shoulder extension to neutral 10 x 3 - cues to focus on scap retraction and avoid pushing excessively into extension Standing YTB L shoulder IR with towel roll under arm to maintain neutral shoulder 10 x 3  Standing YTB scap retraction + L shoulder ER with towel roll under arm to maintain neutral shoulder 10 x 3  Standing YTB L shoulder flexion to 90 x 10  THERAPEUTIC ACTIVITIES: To improve functional performance.  Demonstration, verbal and tactile cues throughout for technique. Active L shoulder flexion cabinet reaches to 1st and 2nd shelves 1# x 10  Active L shoulder scaption cabinet reaches to 1st and 2nd shelves 1# x 10     10/24/23 THERAPEUTIC EXERCISE: To improve strength, endurance, ROM, and flexibility.  Demonstration, verbal and tactile cues throughout for technique.  Pulleys - flexion and scaption x 3' each - cues to remain in pain free ROM Standing L shoulder flexion wall slide x 10 Standing L shoulder scaption wall slide x 10  THERAPEUTIC ACTIVITIES: To improve functional performance.  Demonstration, verbal and tactile cues throughout for technique.  Shoulder ROM assessment UE MMT QuickDASH: 22.7 / 100 = 22.7 % Goal assessment  NEUROMUSCULAR RE-EDUCATION: To improve coordination, kinesthesia, posture, and proprioception.  L shoulder CW/CCW circles with small ball on wall in standing x 15 each direction Standing scap retraction into pool noodle on wall + B shoulder flexion x 10 Standing scap retraction into pool noodle on wall + B shoulder scaption x 10  MANUAL THERAPY: To promote normalized muscle tension, improved flexibility, improved joint mobility, and increased ROM utilizing connective tissue massage, therapeutic massage, and manual TP therapy.  STM/DTM and manual TPR to L pecs (esp pec minor), infraspinatus and teres group   10/17/23 Pulleys 3 min flexion and scaption All exercises with L UE: Counter slides into flexion x 20, scaption x 20 w/ 1lb weight Counter CW/CCW circles x 20 each way into flexion Wall slide flexion x 10 Serratus punch 3lb x 20 CW/CCW circles supine 3lb x 10 each way STM to L anterior deltoid MWM into shoulder flexion ER PROM supine  Standing AAROM flexion with cane x 10; scaption x 10    10/15/23 THERAPEUTIC EXERCISE: To improve ROM.  Demonstration, verbal and tactile cues throughout for technique. Pulleys flexion x 2'; scaption x 2' Supine PROM L shoulder   THERAPEUTIC ACTIVITIES: To improve functional performance.  Demonstration, verbal and tactile cues throughout for technique.  Supine 3# serratus press up x 20 LUE Supine 3# small circles at 90 CW x 10;  CCW x 10 with manual PT assist Standing counter dust small circles CW x 20; CCW x 20 within precautionary ROM with PT assisting manually Standing shoulder scaption in  L-bar position with cane x 20 LUE with mirror to prevent shoulder hiking Standing wall dust scaption x 20 LUE Standing ER 1# x 30 LUE with PT manual assist for correct form Standing shoulder rows RTB x 30 BUE w/ PT assist to prevent extension past body Standing shoulder ext RTB x 30 BUE w/ PT assist to prevent extension past body Standing cane ER stretch x 1' x 2   10/12/23 Pulleys Flexion and scaption Reviewed and altered his HEP as he requested many changes Issued red tband Supine 3# punches with PT limiting motion Supine 3# serratus punches Supine 3# isometric circles Supine 2# flexion from 90 degrees to his tolerance Supine 1# ER from belly with elbow at 60 degrees abduction Right sidelying 1# ER holding elbow at side All above 2x10 Supine PROM, jome gentle joint distraction Measured AROM in standing   10/08/2023 THERAPEUTIC ACTIVITIES: To improve functional performance with overhead reach.  Demonstration, verbal and tactile cues throughout for technique. Pulleys - flexion and scaption x 3' each - cues to remain in pain free ROM Inclined L shoulder flexion 2 x 10- cues to avoid painful ROM Inclined L shoulder scaption 2 x 10 - cues to avoid painful ROM  NEUROMUSCULAR RE-EDUCATION: To improve coordination, kinesthesia, posture, proprioception, and amplitude of movement. Seated bent over prone scapular retraction with (cues to avoid shoulder shrug and avoid extension ROM beyond neutral): Row to neutral 2 x 10 Shoulder extension to neutral 2 x 10 L shoulder rhythmic stabilization at 90 flexion x 30 sec with 2-finger perturbation at elbow, 2 x 30 sec with 2-finger perturbation at distal forearm  Supine L shoulder CW/CCW small circles at 90 flexion 2 x 10 each direction (cues to keep circular motion small  amplitude) L shoulder YTB reactive isometrics step-outs - neutral shoulder with elbow at 90: Flexion x 10 IR x 10 ER x 5   10/05/2023 THERAPEUTIC ACTIVITIES: To improve functional performance with overhead reach.  Demonstration, verbal and tactile cues throughout for technique. Pulleys - flexion and scaption x 3' each - cues to remain in pain free ROM  NEUROMUSCULAR RE-EDUCATION: To improve coordination, kinesthesia, posture, proprioception, and amplitude of movement. Seated bent over prone scapular retraction with (cues to avoid shoulder shrug and avoid extension ROM beyond neutral): Row to neutral 2 x 10 Shoulder extension to neutral 2 x 10 L shoulder rhythmic stabilization at 90 flexion x 30 sec with 2-finger perturbation at elbow, 2 x 30 sec with 2-finger perturbation at distal forearm Supine L shoulder CW/CCW small circles at 90 flexion 2 x 10 each direction (cues to keep circular motion small amplitude) L shoulder YTB reactive isometrics step-outs - neutral shoulder with elbow at 90: Flexion x 10 IR x 10 ER x 5   10/03/2023  THERAPEUTIC ACTIVITY: to improve ability to reach overhead Pulleys - flexion and scaption x 3' each - cues to remain in pain free ROM Supine inclined shoulder AAROM flexion with wand x 10 Supine inclined shoulder AROM flexion 2 x 10 Supine inclined shoulder AROM scaption with wand 2 x 10  NEUROMUSCULAR RE-EDUCATION: To improve coordination, kinesthesia, posture, and proprioception. Standing YTB row 3 x 10 (VC & TC to avoid shoulder shrug) Standing YTB  shoulder extension to neutral 3 x 10 Prone scapular retraction with (cues to avoid shoulder shrug): Row to neutral 3 x 10 Shoulder extension to neutral 3 x 10   09/25/2023  THERAPEUTIC EXERCISE: To improve strength, endurance, ROM, and flexibility.  Demonstration, verbal and tactile cues throughout for technique. Pulleys - flexion and scaption x 3' each - cues to remain in pain free ROM Supine L  shoulder AROM flexion with elbow flexed (uppercut) 2 x 10 Supine L shoulder AROM scaption with elbow flexed (hitchhiker) 2 x 10  NEUROMUSCULAR RE-EDUCATION: To improve coordination, kinesthesia, posture, and proprioception. Standing YTB scap retraction + B shoulder row 2 x 10 (VC & TC to avoid shoulder shrug) Standing YTB scap retraction + B shoulder extension to neutral 2 x 10 Prone scapular retraction with (cues to avoid shoulder shrug): Row to neutral 2 x 10 Shoulder extension to neutral 2 x 10 L shoulder rhythmic stabilization at 90 flexion x 30 sec with 2-finger perturbation at elbow, 2 x 30 sec with 2-finger perturbation at distal forearm   PATIENT EDUCATION:  Education details: HEP review, HEP update - anterior and posterior shoulder capsule stretches, continue with current HEP, and postural awareness  Person educated: Patient Education method: Explanation, Demonstration, Verbal cues, Tactile cues, and MedBridge[employer] app updated Education comprehension: verbalized understanding, returned demonstration, verbal cues required, tactile cues required, and needs further education  HOME EXERCISE PROGRAM: *Pt using MedBridge[employer] app  Access Code: [email or web address] URL: [email or web address] Date: 11/21/2023 Prepared by: [name]  Exercises - Doorway Pec Stretch at 60 Degrees Abduction with Arm Straight (Mirrored)  - 1-2 x daily - 7 x weekly - 3 reps - 30 sec hold - Doorway Rhomboid Stretch  - 1-2 x daily - 7 x weekly - 3 reps - 30 sec hold - Seated Shoulder External Rotation AAROM with Cane and Hand in Neutral (Mirrored)  - 1 x daily - 5 x weekly - 2 sets - 10 reps - 3 sec hold - Standing Shoulder Extension ROM with Dowel  - 1 x daily - 5 x weekly - 2 sets - 10 reps - 3 sec hold - Standing Bilateral Shoulder Internal Rotation AAROM with Dowel  - 1 x daily - 5 x weekly - 2 sets - 10 reps - 3 sec hold - Standing Shoulder Internal Rotation AAROM with Dowel (Mirrored)  - 1 x daily - 5  x weekly - 2 sets - 10 reps - 3 sec hold - Seated Shoulder Extension  - 1 x daily - 3 x weekly - 2 sets - 10 reps - 3 sec hold - Seated Bent Over Shoulder Row with Dumbbells  - 1 x daily - 3 x weekly - 2 sets - 10 reps - 3 sec hold - Active Shoulder Flexion To Each Shelf  - 1 x daily - 3 x weekly - 2 sets - 10 reps - Active Shoulder Scaption To Each Shelf  - 1 x daily - 3 x weekly - 2 sets - 10 reps - Standing Shoulder Row with Anchored Resistance  - 1 x daily - 3 x weekly - 2 sets - 10 reps - 5 sec hold - Shoulder extension with resistance - Neutral  - 1 x daily - 3 x weekly - 2 sets - 10 reps - 5 sec hold - Shoulder Internal Rotation with Resistance  - 1 x daily - 3 x weekly - 1-2 sets - 10 reps - 3 sec hold - Shoulder External Rotation with Anchored Resistance  -  1 x daily - 3 x weekly - 1-2 sets - 10 reps - 3 sec hold - Standing Single Arm Shoulder Flexion with Posterior Anchored Resistance  - 1 x daily - 3 x weekly - 2 sets - 10 reps - 3 sec hold - Low Trap Setting at Wall  - 1 x daily - 3 x weekly - 2 sets - 10 reps   ASSESSMENT:  CLINICAL IMPRESSION:  [name] reports no pain at rest and only minimal pain with certain motions, along with continued difficulty lying supine w/o support under L UE.  Introduced gentle stretching for anterior shoulder and posterior capsule to help promote more neutral shoulder alignment and improved comfort with lying supine with shoulder extended beyond neutral.  Reviewed wand AAROM for behind the back motions clarifying movement at both shoulder and elbow.  Continued to progress scapular stabilization and strengthening, clarifying need for slight pressure against ulnar side of hand during the wall slides for lower trap setting as well as working on serratus activation for improved scapulohumeral kinematics.  [name] will benefit from continued skilled PT to address ongoing ROM, glenohumeral coordination, and strength deficits to improve mobility and activity  tolerance with decreased pain interference.  EVAL: [name] is a 78 y.o. male who was referred to physical therapy for evaluation and treatment s/p L reverse TSA on 08/07/2023.  Patient reports nerve block wore off on POD #2 but pain has been mild (</= 3/10) since. Pain is worse with with perturbation to L UE.  Patient has deficits in L shoulder ROM and flexibility, B shoulder and postural strength, and abnormal posture which are interfering with ADLs and are impacting quality of life.  On QuickDASH patient scored 68.2/100 demonstrating 68.2% disability.  Potential for possible skin breakdown observed in L cubital fossa from moisture trapped while being in sling, therefore pt encouraged to spend a little more time out of the sling while seated to allow for air circulation to skin.  [place] will benefit from skilled PT to address above deficits to improve mobility and activity tolerance with decreased pain interference.  OBJECTIVE IMPAIRMENTS: decreased activity tolerance, decreased knowledge of condition, decreased mobility, decreased ROM, decreased strength, increased fascial restrictions, impaired perceived functional ability, increased muscle spasms, impaired flexibility, improper body mechanics, postural dysfunction, and pain.   ACTIVITY LIMITATIONS: carrying, lifting, sleeping, bed mobility, bathing, toileting, dressing, self feeding, reach over head, and caring for others  PARTICIPATION LIMITATIONS: meal prep, cleaning, laundry, driving, and community activity  PERSONAL FACTORS: Age, Fitness, Past/current experiences, Time since onset of injury/illness/exacerbation, and 3+ comorbidities: Aneurysm of ascending aorta, aortic stenosis, B sensorineural hearing loss - extremely HOH, CAD s/p CABG, cardiomyopathy, AVR, chronic anticoagulation, DM-II with neuropathy, CVA of MCA, HTN, expressive aphasia, gout, lumbar stenosis s/p lumbar laminectomy and decompression 2023, shoulder OA, CKD, ventral  hernia, hernia repair, hip surgery, hx multiple strokes (3-4), prostate cancer  are also affecting patient's functional outcome.   REHAB POTENTIAL: Good  CLINICAL DECISION MAKING: Evolving/moderate complexity  EVALUATION COMPLEXITY: Moderate   GOALS: Goals reviewed with patient? Yes  SHORT TERM GOALS: Target date: 09/26/2023  Patient will be independent with initial HEP to improve outcomes and carryover.  Baseline: Initial HEP provided on eval 08/30/23 - reviewed HEP and reinforced only P/AAROM at this phase of rehab protocol 09/14/23 - reviewed HEP and reinforced only P/AAROM at this phase of rehab protocol Goal status: MET - 09/20/23  2.  Patient will report 25% improvement in L shoulder pain to improve QOL.  Baseline: up to 3/10 Goal status: MET - 09/14/23 - 85% improved   3.  Patient will improve L shoulder flexion and scaption to >/=90 to improve functional use of L UE. Baseline: Refer to above UE ROM table Goal status: MET - 09/06/23 see ROM table  LONG TERM GOALS: Target date: 11/07/2023, extended to 11/30/2023  Patient will be independent with ongoing/advanced HEP for self-management at home.  Baseline:  09/25/23 - HEP updated today 10/05/23 - HEP updated today 10/24/23 - continue with current HEP 10/31/23 - HEP updated 11/02/22 - HEP reorganized to split band and weight resisted exercises on alternating days Goal status: IN PROGRESS - 11/21/23 - anterior/posterior shoulders stretches added  2.  Patient will report 50-75% improvement in L shoulder pain to improve QOL.  Baseline: up to 3/10 Goal status: MET - 09/14/23 - 85% improved   3.  Patient to demonstrate improved upright posture with posterior shoulder girdle engaged to promote improved glenohumeral joint mobility. Baseline:  Goal status: IN PROGRESS - 11/02/23 - reinforced postural awareness, esp scapular engagement to reduce fwd rounding of shoulders  4.  Patient to improve L shoulder AROM to [facility] without pain  provocation to allow for increased ease of ADLs.  Baseline: Refer to above UE ROM table 10/03/23 - L shoulder flexion and scaption/abduction nearing expected outcomes status post reverse TSA Goal status: IN PROGRESS - 11/14/23  5.  Patient will demonstrate improved L shoulder strength to >/= 4/5 for functional UE use. Baseline: Refer to above UE MMT table Goal status: PARTIALLY MET - 10/24/23 - Met for flexion & scaption, ER 4-/5  6  Patient will report </= 50% on QuickDASH  to demonstrate improved functional ability.  Baseline: 68.2 / 100 = 68.2 % Goal status: MET - 10/24/23 - 22.7 / 100 = 22.7 %   PLAN:  PT FREQUENCY: 1x/week   PT DURATION: 4 weeks  PLANNED INTERVENTIONS: 02835- PT Re-evaluation, 97750- Physical Performance Testing, 97110-Therapeutic exercises, 97530- Therapeutic activity, [id number]- Neuromuscular re-education, 97535- Self Care, 02859- Manual therapy, [id number]- Electrical stimulation (unattended), [id number]- Vasopneumatic device, 97035- Ultrasound, 02966- Ionotophoresis 4mg /ml Dexamethasone , Patient/Family education, Taping, Joint mobilization, Cryotherapy, and Moist heat  PLAN FOR NEXT SESSION: Reassess shoulder strength; continue gradual progression per L reverse TSA protocol phase V (12-16 wks) to VI (16+ weeks) - post-op week #16 as of 11/27/23 (surgery 08/07/23)    David CHRISTELLA Montgomery, PT 11/21/2023, 11:45 AM

## 2023-11-22 ENCOUNTER — Other Ambulatory Visit: Payer: Self-pay | Admitting: Internal Medicine

## 2023-11-28 ENCOUNTER — Ambulatory Visit: Admitting: Physical Therapy

## 2023-11-28 ENCOUNTER — Encounter: Payer: Self-pay | Admitting: Physical Therapy

## 2023-11-28 DIAGNOSIS — M6281 Muscle weakness (generalized): Secondary | ICD-10-CM | POA: Diagnosis not present

## 2023-11-28 DIAGNOSIS — M25612 Stiffness of left shoulder, not elsewhere classified: Secondary | ICD-10-CM | POA: Diagnosis not present

## 2023-11-28 DIAGNOSIS — R293 Abnormal posture: Secondary | ICD-10-CM | POA: Diagnosis not present

## 2023-11-28 DIAGNOSIS — M25512 Pain in left shoulder: Secondary | ICD-10-CM | POA: Diagnosis not present

## 2023-11-28 NOTE — Therapy (Signed)
 OUTPATIENT PHYSICAL THERAPY TREATMENT / DISCHARGE SUMMARY  Progress Note  Reporting Period 11/02/2023 to 11/28/2023   See note below for Objective Data and Assessment of Progress/Goals.       Patient Name: [name] MRN: [id number] DOB:08-01-1945, 78 y.o., male Today's Date: 11/28/2023   END OF SESSION:  PT End of Session - 11/28/23 1104     Visit Number 19    Date for Recertification  11/30/23    Authorization Type [employer]    Progress Note Due on Visit 24   MD PN for 10/26/23 on visit #14 (10/24/23)   PT Start Time 1104    PT Stop Time 1152    PT Time Calculation (min) 48 min    Activity Tolerance Patient tolerated treatment well    Behavior During Therapy [facility] for tasks assessed/performed             Past Medical History:  Diagnosis Date   Actinic keratosis 02/14/2011   Allergy    Aneurysm of ascending aorta without rupture 01/06/2022   Aortic stenosis    Arthritis    Bilateral sensorineural hearing loss 06/16/2009   CAD (coronary artery disease)    cabg   Complication of anesthesia    Coronary atherosclerosis of native coronary artery 06/15/2009   Diabetic polyneuropathy 11/11/2018   Dyslipidemia 11/05/2018   Embolic stroke involving left middle cerebral artery  05/30/2021   Erectile dysfunction 05/18/2009   Essential hypertension 06/16/2009   Expressive aphasia 06/25/2021   External hemorrhoids 03/21/2010   Gout 04/09/2015   Heart murmur    Hemochromatosis 02/14/2011   History of aortic valve replacement 10/13/2009   History of CABG 11/05/2018   2005- 1. Coronary artery bypass grafting x4 with LIMA-LAD, SVG-OM1, SVG-AM and dCFX.  2. Aortic valve replacement with [employer] AVR 3. Septal myomectomy.  4. Myoview low risk 2016   History of hepatitis 01/31/1972   Hypertriglyceridemia 06/13/2010   Hypertrophic obstructive cardiomyopathy 05/06/2008   SP septal myomectomy at surgery 2005 Echo Nov 2018- Hyperdynamic LVEF with severe basal septal  hypertrophy. There is chordal SAM with resting gradient of 16 mmHg that increases to 85 mmHg with Valsalva        Leg weakness, bilateral 01/18/2021   Long term (current) use of anticoagulants 05/22/2018   Lumbar stenosis with neurogenic claudication 02/04/2021   Malignant neoplasm of prostate 06/17/2009   Mechanical heart valve present    Manufacturer: Deitra Rosemary Blade #: [id number]  Model #: [id number]. Card states MRI compatible with 3 teslas or less.   Obesity, unspecified 04/29/2008   OSA (obstructive sleep apnea) 07/12/2012   moderate-severe; uses CPAP nightly   PONV (postoperative nausea and vomiting)    after CABG- slow to wake up   Presence of prosthetic heart valve    Primary osteoarthritis of left shoulder 10/31/2018   S/P lumbar laminectomy 02/04/2021   Stage 3 chronic kidney disease due to type 2 diabetes mellitus 11/11/2018   Tobacco use 06/16/2009   Type 2 diabetes mellitus with hyperglycemia    Ventral hernia 03/03/2014   Past Surgical History:  Procedure Laterality Date   AORTIC VALVE REPLACEMENT  11/14/2003   [facility]   APPENDECTOMY  1990   CHOLECYSTECTOMY  1990   CORONARY ANGIOGRAPHY  10/11/2021   CORONARY ARTERY BYPASS GRAFT  10/2003   HERNIA REPAIR  1999   right, inguinal   HERNIA REPAIR  2002   left, inguinal   HIP SURGERY  2006   right  hip   IR CT HEAD LTD  02/03/2022   IR PERCUTANEOUS ART THROMBECTOMY/INFUSION INTRACRANIAL INC DIAG ANGIO  01/24/2022   LUMBAR LAMINECTOMY/DECOMPRESSION MICRODISCECTOMY Bilateral 02/04/2021   Procedure: Bilateral Lumbar Two-Three Laminectomy;  Surgeon: Colon Shove, MD;  Location: [facility] OR;  Service: Neurosurgery;  Laterality: Bilateral;  3C/RM 20   PILONIDAL CYST EXCISION  1964   prostate seed implant  03/2010   RADIOLOGY WITH ANESTHESIA N/A 01/24/2022   Procedure: IR WITH ANESTHESIA;  Surgeon: Radiologist, Medication, MD;  Location: [facility] OR;  Service: Radiology;  Laterality: N/A;   REVERSE SHOULDER ARTHROPLASTY Left  08/07/2023   Procedure: ARTHROPLASTY, SHOULDER, TOTAL, REVERSE;  Surgeon: Josefina Chew, MD;  Location: [facility] ORS;  Service: Orthopedics;  Laterality: Left;   TEE WITHOUT CARDIOVERSION N/A 03/14/2022   Procedure: TRANSESOPHAGEAL ECHOCARDIOGRAM (TEE);  Surgeon: Loni Soyla LABOR, MD;  Location: [facility] ENDOSCOPY;  Service: Cardiology;  Laterality: N/A;   TONSILLECTOMY  childhood   Patient Active Problem List   Diagnosis Date Noted   S/P reverse total shoulder arthroplasty, left 08/07/2023   Pre-op evaluation 01/17/2023   Left shoulder pain 11/17/2022   Hypertrophic cardiomyopathy (HCC) 08/12/2022   Chronic anticoagulation 08/12/2022   Hypotension 04/27/2022   H/O: CVA (cerebrovascular accident) 03/14/2022   Urinary retention 01/26/2022   Acute ischemic stroke (HCC) 01/24/2022   Middle cerebral artery embolism, left 01/24/2022   Aneurysm of ascending aorta without rupture 01/06/2022   Type 2 diabetes mellitus with stage 3a chronic kidney disease, with long-term current use of insulin  (HCC) 10/30/2021   Type 2 diabetes mellitus with diabetic polyneuropathy, with long-term current use of insulin  (HCC) 10/30/2021   NSTEMI (non-ST elevated myocardial infarction) (HCC) 10/09/2021   Paroxysmal atrial fibrillation (HCC) 10/09/2021   Presence of prosthetic heart valve    Type 2 diabetes mellitus with hyperglycemia    Expressive aphasia 06/25/2021   History of CVA (cerebrovascular accident) 05/2021   S/P lumbar laminectomy 02/04/2021   Lumbar stenosis with neurogenic claudication 02/04/2021   History of falling 01/30/2021   Diabetic polyneuropathy 11/11/2018   Stage 3 chronic kidney disease due to type 2 diabetes mellitus 11/11/2018   History of CABG 11/05/2018   Dyslipidemia 11/05/2018   Primary osteoarthritis, left shoulder 10/31/2018   Long term (current) use of anticoagulants 05/22/2018   Ventral hernia 03/03/2014   OSA (obstructive sleep apnea) 07/12/2012   Hemochromatosis 02/14/2011    Actinic keratosis 02/14/2011   Hypertriglyceridemia 06/13/2010   External hemorrhoids 03/21/2010   History of aortic valve replacement 10/13/2009   Malignant neoplasm of prostate (HCC) 06/17/2009   Bilateral sensorineural hearing loss 06/16/2009   Essential hypertension 06/16/2009   Coronary atherosclerosis of native coronary artery 06/15/2009   Erectile dysfunction 05/18/2009   Obesity, unspecified 04/29/2008    PCP: Daryl Setter, NP   REFERRING PROVIDER: Josefina Chew, MD   REFERRING DIAG: [phone] (ICD-10-CM) - Status post reverse total shoulder replacement, left  THERAPY DIAG:  Stiffness of left shoulder, not elsewhere classified  Acute pain of left shoulder  Muscle weakness (generalized)  Abnormal posture  RATIONALE FOR EVALUATION AND TREATMENT: Rehabilitation  ONSET DATE: 08/07/2023 - L reverse TSA  NEXT MD VISIT: 3-6 months   SUBJECTIVE:  SUBJECTIVE STATEMENT:  Patient requesting new RTB as well as review of his shoulder stretches.  PAIN: Are you having pain? No and Yes: NPRS scale: 0/10 Pain location: L shoulder  Pain description: ache  Aggravating factors: perturbation to arm  Relieving factors: ice, Tylenol  PRN   PERTINENT HISTORY:  Aneurysm of ascending aorta, aortic stenosis, B sensorineural hearing loss - extremely HOH, CAD s/p CABG, cardiomyopathy, AVR, chronic anticoagulation, DM-II with neuropathy, CVA of MCA, HTN, expressive aphasia, gout, lumbar stenosis s/p lumbar laminectomy and decompression 2023, shoulder OA, CKD, ventral hernia, hernia repair, hip surgery, hx multiple strokes (3-4), prostate cancer   PRECAUTIONS: Shoulder -  Avoid resisted IR/backward extension until 3 months post-op to protect subscapularis repair Progress patient in scapular  plane from PROM to AAROM to AROM ensuring proper kinematics throughout care to avoid over stress to deltoid/deltoid attachments   RED FLAGS: None  HAND DOMINANCE: Right  WEIGHT BEARING RESTRICTIONS: Yes Phase I (1-4 wks): <1 lb; Phase II-IV (4-12 wks): <5 lbs; Phase V (12-16 wks): advance to tolerance; Phase VI (16 wks to 6 mo): Full  FALLS:  Has patient fallen in last 6 months? Yes. Number of falls 1 - fell in garden in May  LIVING ENVIRONMENT:  Lives with: lives with their family Lives in: House/apartment Stairs: No Has following equipment at home: Environmental Consultant - 2 wheeled, Crutches, and Grab bars  OCCUPATION: Retired  PLOF: Independent with household mobility without device, Independent with community mobility with device, Needs assistance with ADLs, and Leisure: gardening, 2-3 days/wk at [employer]  PATIENT GOALS: To use normal ROM (for my arm) as much as I can. To not be in pain all the time.   OBJECTIVE: (objective measures completed at initial evaluation unless otherwise dated)  DIAGNOSTIC FINDINGS:  08/07/23 - DG Left Shoulder: CLINICAL DATA:  Status post reverse shoulder arthroplasty.   FINDINGS: Total left shoulder arthroplasty. The arthroplasty components appear intact and in anatomic alignment. There is no acute fracture ordislocation. Postsurgical changes in the soft tissues of the leftshoulder.   IMPRESSION: Total left shoulder arthroplasty.  PATIENT SURVEYS:  Quick Dash: 68.2 / 100 = 68.2 % Minimally Clinically Important Difference (MCID): 15-20 points  COGNITION: Overall cognitive status: Within functional limits for tasks assessed     SENSATION: WFL  POSTURE: rounded shoulders and forward head  UPPER EXTREMITY ROM:   Passive ROM Left eval L 08/30/23 L 09/06/23 L 09/14/23 L 10/03/23  Shoulder flexion 58 90 110 119 120  Shoulder extension       Shoulder abduction 50 scaption 90 scaption 105 117 scaption 128  Shoulder adduction       Shoulder internal  rotation       Shoulder external rotation -6 from neutral  24 at ~30 scaption 21 31 at ~30 scaption 30   Active ROM Right  L 10/12/23 standing L 10/17/23 L 10/24/23 L 11/02/23 L 11/14/23 L 11/28/23  Shoulder flexion  108 110 118 119 120 119  Shoulder extension     25    Shoulder abduction  102 Scaption- 110 111 scaption 110 scaption 115 scaption 114 scaption  Shoulder adduction         Shoulder internal rotation     74 at ~45 scaption    Shoulder external rotation  40 at 90 degrees abduction  38 at ~30 scaption 49 at ~45 scaption 40 sitting  48 at ~45 scaption  (Blank rows = not tested)  UPPER EXTREMITY MMT: (deferred on eval d/t only 1  week postop)  MMT R 10/24/23 L 10/24/23 L 11/28/23  Shoulder flexion 5 4+ 4+  Shoulder extension   4+  Shoulder abduction 5 4+ scaption 4+ scaption  Shoulder adduction     Shoulder internal rotation   4+  Shoulder external rotation 4 4- 4  Middle trapezius     Lower trapezius     (Blank rows = not tested)  JOINT MOBILITY TESTING:  Patient very guarded with initial PROM  PALPATION/OBSERVATION: Bruising draining into upper arm.  Skin at cubital fossa pale and slightly moist/macerated.    TODAY'S TREATMENT:   11/28/2023 THERAPEUTIC EXERCISE: To improve strength, endurance, ROM, and flexibility.  Demonstration, verbal and tactile cues throughout for technique.  UBE - L1.0 x 3' each forward and backward Standing L low pec stretch at doorframe 3 x 30 - cues for small step fwd for gentle stretch Standing L posterior capsule stretch with hand on doorframe 3 x 30 - cues to turn body without leaning to side for gentle stretch   NEUROMUSCULAR RE-EDUCATION: To improve coordination, kinesthesia, and posture. Standing RTB scap retraction & depression with isometric shoulder extension maintaining hands at sides 2 x 10 Standing YTB scap retraction & depression with band draped across shoulders and crossed behind back 3 x 10 B lower trap slides up wall x  10 - cues to press ulnar side of hand into wall during wall slide + slight lift off at top of motion while maintaining lower trap engagement at scapula B lower trap slides up wall with looped YTB at wrists x 10 - cues to press ulnar side of hand into wall during wall slide, but deferring lift off at top of motion  SELF CARE:  Provided education on postural awareness and need for coordinated movement of scapula with overhead UE ROM.   Completed HEP review including recommended frequency for home performance to promote continued improvement and prevent loss of gains achieved with physical therapy, and to prevent future decline in function.   THERAPEUTIC ACTIVITIES: To improve functional performance.  Demonstration, verbal and tactile cues throughout for technique. UE ROM & MMT assessment Goal assessment   11/21/2023 THERAPEUTIC EXERCISE: To improve strength, endurance, ROM, and flexibility.  Demonstration, verbal and tactile cues throughout for technique.  UBE - L1.0 x 3' each forward and backward Standing L low pec stretch at doorframe 3 x 30 - cues for small step for gentle stretch Standing L posterior capsule stretch with hand on doorframe 3 x 30 - cues for gentle stretch  THERAPEUTIC ACTIVITIES: To improve comfort with shoulder resting on bed while sleeping as well as ability to put on belt and perform personal hygiene when going to bathroom. Demonstration, verbal and tactile cues throughout for technique. Standing L shoulder extension AAROM with cane behind back x 10 Standing B shoulder IR AAROM with cane drawing up back x 10 Standing L shoulder IR AAROM with cane pulling gently to R behind back x 10 - clarified that it is ok to allow elbow to bend behind back  NEUROMUSCULAR RE-EDUCATION: To improve coordination, kinesthesia, posture, proprioception, and to improve scapulohumeral control. B lower trap slides up wall x 10 - cues to press ulnar side of hand into wall during wall slide B  serratus roll-up on FR on wall x 10 Serratus 3-way wall clocks alternating L/R x 5   11/14/23 THERAPEUTIC EXERCISE: To improve endurance, ROM, and flexibility.  Demonstration, verbal and tactile cues throughout for technique.  Pulleys - flexion and scaption x 3'  each  Measured L shoulder AROM  NEUROMUSCULAR RE-EDUCATION: To improve coordination, kinesthesia, and posture. Lower trap slides up wall x 10 B Seated bent over L shoulder ext 2lb weight x 10 Seated L shoulder ER sidelying towel at side 2lb x 10  THERAPEUTIC ACTIVITIES: To improve ability to put on belt, wipe when going to bathroom and to reach overhead into cabinets  Demonstration, verbal and tactile cues throughout for technique. Standing L shoulder extension AAROM with cane behind back x 10 Standing B shoulder IR AAROM with cane drawing up back x 10 Standing L shoulder IR AAROM with cane pulling gently to R behind back x 10  Active R/L shoulder flexion cabinet reaches to 1st and 2nd shelves 2# x 10  Active R/L shoulder scaption cabinet reaches to 1st, 2nd, 3rd shelves 2# x 10    11/02/23 - Recert THERAPEUTIC EXERCISE: To improve endurance, ROM, and flexibility.  Demonstration, verbal and tactile cues throughout for technique.  Pulleys - flexion and scaption x 3' each - cues to remain in pain free ROM Standing L shoulder extension AAROM with cane behind back 2 x 10 Standing B shoulder IR AAROM with cane drawing up back 2 x 10 Standing L shoulder IR AAROM with cane pulling gently to R behind back 2 x 10  MANUAL THERAPY: To promote normalized muscle tension, improved flexibility, increased ROM, and reduced pain utilizing connective tissue massage, therapeutic massage, and manual TP therapy. STM/DTM and manual TPR to L posterolateral deltoid  THERAPEUTIC ACTIVITIES: To improve functional performance.  Demonstration, verbal and tactile cues throughout for technique.  L shoulder ROM assessment Goal assessment  SELF CARE:   Provided education on postural awareness t/o daily routine, including incorporating scapular retraction exercises as postural reminder/checks while at stop lights or watching TV. Verbal review of HEP, with HEP reorganized to split band and weight resisted exercises on alternating days, emphasizing need for rest days between days of strengthening.   10/31/23 THERAPEUTIC EXERCISE: To improve endurance, ROM, and flexibility.  Demonstration, verbal and tactile cues throughout for technique.  Pulleys - flexion and scaption x 3' each - cues to remain in pain free ROM Standing L shoulder extension AAROM with cane behind back x 10 Standing B shoulder IR AAROM with cane drawing up back x 10 Standing L shoulder IR AAROM with cane pulling gently to R behind back x 10   NEUROMUSCULAR RE-EDUCATION: To improve coordination, kinesthesia, and posture. Standing YTB scap retraction + B shoulder row to neutral 10 x 3 - cues to avoid up and backward rotation of elbows, focusing on scap retraction Standing YTB scap retraction + B shoulder extension to neutral 10 x 3 - cues to focus on scap retraction and avoid pushing excessively into extension Standing YTB L shoulder IR with towel roll under arm to maintain neutral shoulder 10 x 3  Standing YTB scap retraction + L shoulder ER with towel roll under arm to maintain neutral shoulder 10 x 3  Standing YTB L shoulder flexion to 90 x 10  THERAPEUTIC ACTIVITIES: To improve functional performance.  Demonstration, verbal and tactile cues throughout for technique. Active L shoulder flexion cabinet reaches to 1st and 2nd shelves 1# x 10  Active L shoulder scaption cabinet reaches to 1st and 2nd shelves 1# x 10    10/24/23 THERAPEUTIC EXERCISE: To improve strength, endurance, ROM, and flexibility.  Demonstration, verbal and tactile cues throughout for technique.  Pulleys - flexion and scaption x 3' each - cues to remain in  pain free ROM Standing L shoulder flexion  wall slide x 10 Standing L shoulder scaption wall slide x 10  THERAPEUTIC ACTIVITIES: To improve functional performance.  Demonstration, verbal and tactile cues throughout for technique.  Shoulder ROM assessment UE MMT QuickDASH: 22.7 / 100 = 22.7 % Goal assessment  NEUROMUSCULAR RE-EDUCATION: To improve coordination, kinesthesia, posture, and proprioception.  L shoulder CW/CCW circles with small ball on wall in standing x 15 each direction Standing scap retraction into pool noodle on wall + B shoulder flexion x 10 Standing scap retraction into pool noodle on wall + B shoulder scaption x 10  MANUAL THERAPY: To promote normalized muscle tension, improved flexibility, improved joint mobility, and increased ROM utilizing connective tissue massage, therapeutic massage, and manual TP therapy.  STM/DTM and manual TPR to L pecs (esp pec minor), infraspinatus and teres group   10/17/23 Pulleys 3 min flexion and scaption All exercises with L UE: Counter slides into flexion x 20, scaption x 20 w/ 1lb weight Counter CW/CCW circles x 20 each way into flexion Wall slide flexion x 10 Serratus punch 3lb x 20 CW/CCW circles supine 3lb x 10 each way STM to L anterior deltoid MWM into shoulder flexion ER PROM supine  Standing AAROM flexion with cane x 10; scaption x 10    10/15/23 THERAPEUTIC EXERCISE: To improve ROM.  Demonstration, verbal and tactile cues throughout for technique. Pulleys flexion x 2'; scaption x 2' Supine PROM L shoulder   THERAPEUTIC ACTIVITIES: To improve functional performance.  Demonstration, verbal and tactile cues throughout for technique. Supine 3# serratus press up x 20 LUE Supine 3# small circles at 90 CW x 10; CCW x 10 with manual PT assist Standing counter dust small circles CW x 20; CCW x 20 within precautionary ROM with PT assisting manually Standing shoulder scaption in  L-bar position with cane x 20 LUE with mirror to prevent shoulder hiking Standing wall dust  scaption x 20 LUE Standing ER 1# x 30 LUE with PT manual assist for correct form Standing shoulder rows RTB x 30 BUE w/ PT assist to prevent extension past body Standing shoulder ext RTB x 30 BUE w/ PT assist to prevent extension past body Standing cane ER stretch x 1' x 2   10/12/23 Pulleys Flexion and scaption Reviewed and altered his HEP as he requested many changes Issued red tband Supine 3# punches with PT limiting motion Supine 3# serratus punches Supine 3# isometric circles Supine 2# flexion from 90 degrees to his tolerance Supine 1# ER from belly with elbow at 60 degrees abduction Right sidelying 1# ER holding elbow at side All above 2x10 Supine PROM, jome gentle joint distraction Measured AROM in standing   10/08/2023 THERAPEUTIC ACTIVITIES: To improve functional performance with overhead reach.  Demonstration, verbal and tactile cues throughout for technique. Pulleys - flexion and scaption x 3' each - cues to remain in pain free ROM Inclined L shoulder flexion 2 x 10- cues to avoid painful ROM Inclined L shoulder scaption 2 x 10 - cues to avoid painful ROM  NEUROMUSCULAR RE-EDUCATION: To improve coordination, kinesthesia, posture, proprioception, and amplitude of movement. Seated bent over prone scapular retraction with (cues to avoid shoulder shrug and avoid extension ROM beyond neutral): Row to neutral 2 x 10 Shoulder extension to neutral 2 x 10 L shoulder rhythmic stabilization at 90 flexion x 30 sec with 2-finger perturbation at elbow, 2 x 30 sec with 2-finger perturbation at distal forearm  Supine L shoulder CW/CCW small  circles at 90 flexion 2 x 10 each direction (cues to keep circular motion small amplitude) L shoulder YTB reactive isometrics step-outs - neutral shoulder with elbow at 90: Flexion x 10 IR x 10 ER x 5   10/05/2023 THERAPEUTIC ACTIVITIES: To improve functional performance with overhead reach.  Demonstration, verbal and tactile cues throughout for  technique. Pulleys - flexion and scaption x 3' each - cues to remain in pain free ROM  NEUROMUSCULAR RE-EDUCATION: To improve coordination, kinesthesia, posture, proprioception, and amplitude of movement. Seated bent over prone scapular retraction with (cues to avoid shoulder shrug and avoid extension ROM beyond neutral): Row to neutral 2 x 10 Shoulder extension to neutral 2 x 10 L shoulder rhythmic stabilization at 90 flexion x 30 sec with 2-finger perturbation at elbow, 2 x 30 sec with 2-finger perturbation at distal forearm Supine L shoulder CW/CCW small circles at 90 flexion 2 x 10 each direction (cues to keep circular motion small amplitude) L shoulder YTB reactive isometrics step-outs - neutral shoulder with elbow at 90: Flexion x 10 IR x 10 ER x 5   10/03/2023  THERAPEUTIC ACTIVITY: to improve ability to reach overhead Pulleys - flexion and scaption x 3' each - cues to remain in pain free ROM Supine inclined shoulder AAROM flexion with wand x 10 Supine inclined shoulder AROM flexion 2 x 10 Supine inclined shoulder AROM scaption with wand 2 x 10  NEUROMUSCULAR RE-EDUCATION: To improve coordination, kinesthesia, posture, and proprioception. Standing YTB row 3 x 10 (VC & TC to avoid shoulder shrug) Standing YTB shoulder extension to neutral 3 x 10 Prone scapular retraction with (cues to avoid shoulder shrug): Row to neutral 3 x 10 Shoulder extension to neutral 3 x 10   09/25/2023  THERAPEUTIC EXERCISE: To improve strength, endurance, ROM, and flexibility.  Demonstration, verbal and tactile cues throughout for technique. Pulleys - flexion and scaption x 3' each - cues to remain in pain free ROM Supine L shoulder AROM flexion with elbow flexed (uppercut) 2 x 10 Supine L shoulder AROM scaption with elbow flexed (hitchhiker) 2 x 10  NEUROMUSCULAR RE-EDUCATION: To improve coordination, kinesthesia, posture, and proprioception. Standing YTB scap retraction + B shoulder row 2 x 10  (VC & TC to avoid shoulder shrug) Standing YTB scap retraction + B shoulder extension to neutral 2 x 10 Prone scapular retraction with (cues to avoid shoulder shrug): Row to neutral 2 x 10 Shoulder extension to neutral 2 x 10 L shoulder rhythmic stabilization at 90 flexion x 30 sec with 2-finger perturbation at elbow, 2 x 30 sec with 2-finger perturbation at distal forearm   PATIENT EDUCATION:  Education details: HEP review, HEP update - anterior and posterior shoulder capsule stretches, continue with current HEP, and postural awareness  Person educated: Patient Education method: Explanation, Demonstration, Verbal cues, Tactile cues, and MedBridge[employer] app updated Education comprehension: verbalized understanding, returned demonstration, verbal cues required, tactile cues required, and needs further education  HOME EXERCISE PROGRAM: *Pt using MedBridge[employer] app  Access Code: [email or web address] URL: [email or web address] Date: 11/21/2023 Prepared by: Elijah Hidden  Exercises - Doorway Pec Stretch at 60 Degrees Abduction with Arm Straight (Mirrored)  - 1-2 x daily - 7 x weekly - 3 reps - 30 sec hold - Doorway Rhomboid Stretch  - 1-2 x daily - 7 x weekly - 3 reps - 30 sec hold - Seated Shoulder External Rotation AAROM with Cane and Hand in Neutral (Mirrored)  - 1 x daily - 5 x weekly - 2 sets -  10 reps - 3 sec hold - Standing Shoulder Extension ROM with Dowel  - 1 x daily - 5 x weekly - 2 sets - 10 reps - 3 sec hold - Standing Bilateral Shoulder Internal Rotation AAROM with Dowel  - 1 x daily - 5 x weekly - 2 sets - 10 reps - 3 sec hold - Standing Shoulder Internal Rotation AAROM with Dowel (Mirrored)  - 1 x daily - 5 x weekly - 2 sets - 10 reps - 3 sec hold - Seated Shoulder Extension  - 1 x daily - 3 x weekly - 2 sets - 10 reps - 3 sec hold - Seated Bent Over Shoulder Row with Dumbbells  - 1 x daily - 3 x weekly - 2 sets - 10 reps - 3 sec hold - Active Shoulder Flexion To Each Shelf  - 1  x daily - 3 x weekly - 2 sets - 10 reps - Active Shoulder Scaption To Each Shelf  - 1 x daily - 3 x weekly - 2 sets - 10 reps - Standing Shoulder Row with Anchored Resistance  - 1 x daily - 3 x weekly - 2 sets - 10 reps - 5 sec hold - Shoulder extension with resistance - Neutral  - 1 x daily - 3 x weekly - 2 sets - 10 reps - 5 sec hold - Shoulder Internal Rotation with Resistance  - 1 x daily - 3 x weekly - 1-2 sets - 10 reps - 3 sec hold - Shoulder External Rotation with Anchored Resistance  - 1 x daily - 3 x weekly - 1-2 sets - 10 reps - 3 sec hold - Standing Single Arm Shoulder Flexion with Posterior Anchored Resistance  - 1 x daily - 3 x weekly - 2 sets - 10 reps - 3 sec hold - Low Trap Setting at Wall  - 1 x daily - 3 x weekly - 2 sets - 10 reps   ASSESSMENT:  CLINICAL IMPRESSION:  Victorhugo reports no pain at rest and only minimal pain/muscle soreness with certain motions and exercises.  Reinforced that exercises are not to be painful and that the most he should be experiencing is a feeling of stretch or muscle fatigue resulting from exercises but not increased pain.  Addressed patient's questions and concerns related to a few HEP exercises and verbally reviewed the remainder of his current HEP clarifying recommended frequency for ongoing performance to maintain gains achieved with PT and prevent future decline in function.  He continues to require cues/reminders for postural awareness and scapular engagement, therefore highlighted scapular components of some of his current exercises as well as introduced 1 additional exercise to target scapular retraction and depression.  All PT goals now met with exception of postural goal only partially met, and Ralphael is ready to transition to his HEP, therefore will proceed with discharge from physical therapy for this episode.  He is aware that he can communicate with his doctor at his next follow-up if he continues to feel like he is not progressing, and  that the doctor may order continued therapy if indicated.  EVAL: GOKUL WAYBRIGHT is a 78 y.o. male who was referred to physical therapy for evaluation and treatment s/p L reverse TSA on 08/07/2023.  Patient reports nerve block wore off on POD #2 but pain has been mild (</= 3/10) since. Pain is worse with with perturbation to L UE.  Patient has deficits in L shoulder ROM and flexibility, B shoulder  and postural strength, and abnormal posture which are interfering with ADLs and are impacting quality of life.  On QuickDASH patient scored 68.2/100 demonstrating 68.2% disability.  Potential for possible skin breakdown observed in L cubital fossa from moisture trapped while being in sling, therefore pt encouraged to spend a little more time out of the sling while seated to allow for air circulation to skin.  [place] will benefit from skilled PT to address above deficits to improve mobility and activity tolerance with decreased pain interference.  OBJECTIVE IMPAIRMENTS: decreased activity tolerance, decreased knowledge of condition, decreased mobility, decreased ROM, decreased strength, increased fascial restrictions, impaired perceived functional ability, increased muscle spasms, impaired flexibility, improper body mechanics, postural dysfunction, and pain.   ACTIVITY LIMITATIONS: carrying, lifting, sleeping, bed mobility, bathing, toileting, dressing, self feeding, reach over head, and caring for others  PARTICIPATION LIMITATIONS: meal prep, cleaning, laundry, driving, and community activity  PERSONAL FACTORS: Age, Fitness, Past/current experiences, Time since onset of injury/illness/exacerbation, and 3+ comorbidities: Aneurysm of ascending aorta, aortic stenosis, B sensorineural hearing loss - extremely HOH, CAD s/p CABG, cardiomyopathy, AVR, chronic anticoagulation, DM-II with neuropathy, CVA of MCA, HTN, expressive aphasia, gout, lumbar stenosis s/p lumbar laminectomy and decompression 2023, shoulder OA, CKD,  ventral hernia, hernia repair, hip surgery, hx multiple strokes (3-4), prostate cancer  are also affecting patient's functional outcome.   REHAB POTENTIAL: Good  CLINICAL DECISION MAKING: Evolving/moderate complexity  EVALUATION COMPLEXITY: Moderate   GOALS: Goals reviewed with patient? Yes  SHORT TERM GOALS: Target date: 09/26/2023  Patient will be independent with initial HEP to improve outcomes and carryover.  Baseline: Initial HEP provided on eval 08/30/23 - reviewed HEP and reinforced only P/AAROM at this phase of rehab protocol 09/14/23 - reviewed HEP and reinforced only P/AAROM at this phase of rehab protocol Goal status: MET - 09/20/23  2.  Patient will report 25% improvement in L shoulder pain to improve QOL.   Baseline: up to 3/10 Goal status: MET - 09/14/23 - 85% improved   3.  Patient will improve L shoulder flexion and scaption to >/=90 to improve functional use of L UE. Baseline: Refer to above UE ROM table Goal status: MET - 09/06/23 see ROM table  LONG TERM GOALS: Target date: 11/07/2023, extended to 11/30/2023  Patient will be independent with ongoing/advanced HEP for self-management at home.  Baseline:  09/25/23 - HEP updated today 10/05/23 - HEP updated today 10/24/23 - continue with current HEP 10/31/23 - HEP updated 11/02/22 - HEP reorganized to split band and weight resisted exercises on alternating days 11/21/23 - anterior/posterior shoulders stretches added Goal status: MET - 11/28/23 - HEP reviewed addressing all of patient's questions  2.  Patient will report 50-75% improvement in L shoulder pain to improve QOL.  Baseline: up to 3/10 Goal status: MET - 09/14/23 - 85% improved   3.  Patient to demonstrate improved upright posture with posterior shoulder girdle engaged to promote improved glenohumeral joint mobility. Baseline:  11/02/23 - reinforced postural awareness, esp scapular engagement to reduce fwd rounding of shoulders Goal status: PARTIALLY MET -  11/28/23 - pt able to demonstrate better upright posture with scapular engagement, however frequently defaults to flexed posture with rounded shoulders in sitting  4.  Patient to improve L shoulder AROM to [facility] without pain provocation to allow for increased ease of ADLs.  Baseline: Refer to above UE ROM table 10/03/23 - L shoulder flexion and scaption/abduction nearing expected outcomes status post reverse TSA Goal status: MET - 11/28/23  5.  Patient will demonstrate improved L shoulder strength to >/= 4/5 for functional UE use. Baseline: Refer to above UE MMT table 10/24/23 - Met for flexion & scaption, ER 4-/5 Goal status: MET - 11/28/23  6  Patient will report </= 50% on QuickDASH  to demonstrate improved functional ability.  Baseline: 68.2 / 100 = 68.2 % Goal status: MET - 10/24/23 - 22.7 / 100 = 22.7 %   PLAN:  PT FREQUENCY: 1x/week   PT DURATION: 4 weeks  PLANNED INTERVENTIONS: 97164- PT Re-evaluation, 97750- Physical Performance Testing, 97110-Therapeutic exercises, 97530- Therapeutic activity, [id number]- Neuromuscular re-education, 97535- Self Care, 02859- Manual therapy, [id number]- Electrical stimulation (unattended), 97016- Vasopneumatic device, 97035- Ultrasound, 02966- Ionotophoresis 4mg /ml Dexamethasone , Patient/Family education, Taping, Joint mobilization, Cryotherapy, and Moist heat  PLAN FOR NEXT SESSION: Transition to HEP + DC from PT   PHYSICAL THERAPY DISCHARGE SUMMARY  Visits from Start of Care: 19  Current functional level related to goals / functional outcomes: Refer to above clinical impression and goal assessment.    Remaining deficits: Inconsistent postural awareness and scapular activation Limited L shoulder extension and FIR   Education / Equipment: HEP, postural awareness  Patient agrees to discharge. Patient goals were mostly met. Patient is being discharged due to meeting the stated rehab goals.    Elijah CHRISTELLA Hidden, PT 11/28/2023, 12:00 PM

## 2023-11-29 DIAGNOSIS — I639 Cerebral infarction, unspecified: Secondary | ICD-10-CM | POA: Diagnosis not present

## 2023-11-29 DIAGNOSIS — I129 Hypertensive chronic kidney disease with stage 1 through stage 4 chronic kidney disease, or unspecified chronic kidney disease: Secondary | ICD-10-CM | POA: Diagnosis not present

## 2023-11-29 DIAGNOSIS — N2581 Secondary hyperparathyroidism of renal origin: Secondary | ICD-10-CM | POA: Diagnosis not present

## 2023-11-29 DIAGNOSIS — Z952 Presence of prosthetic heart valve: Secondary | ICD-10-CM | POA: Diagnosis not present

## 2023-11-29 DIAGNOSIS — E1122 Type 2 diabetes mellitus with diabetic chronic kidney disease: Secondary | ICD-10-CM | POA: Diagnosis not present

## 2023-11-29 DIAGNOSIS — N1832 Chronic kidney disease, stage 3b: Secondary | ICD-10-CM | POA: Diagnosis not present

## 2023-11-30 ENCOUNTER — Ambulatory Visit: Attending: Cardiology

## 2023-11-30 DIAGNOSIS — Z7901 Long term (current) use of anticoagulants: Secondary | ICD-10-CM

## 2023-11-30 DIAGNOSIS — Z952 Presence of prosthetic heart valve: Secondary | ICD-10-CM

## 2023-11-30 LAB — POCT INR: INR: 2.8 (ref 2.0–3.0)

## 2023-11-30 NOTE — Progress Notes (Signed)
 INR 2.8 Please see anticoagulation encounter Take 1 tablet today only then Continue taking warfarin 1 tablet daily except 1/2 tablet on Mondays, Wednesdays, and Fridays. Remain consistent with leafy green (increase intake to 4 servings/week).  Recheck INR in 4 weeks Coumadin  Clinic [phone].  Clearance Fax # [phone]

## 2023-11-30 NOTE — Patient Instructions (Signed)
 Take 1 tablet today only then Continue taking warfarin 1 tablet daily except 1/2 tablet on Mondays, Wednesdays, and Fridays. Remain consistent with leafy green (increase intake to 4 servings/week).  Recheck INR in 4 weeks Coumadin  Clinic [phone].  Clearance Fax # [phone]

## 2023-12-02 ENCOUNTER — Other Ambulatory Visit: Payer: Self-pay | Admitting: Internal Medicine

## 2023-12-03 ENCOUNTER — Encounter: Payer: Self-pay | Admitting: Radiology

## 2023-12-11 ENCOUNTER — Ambulatory Visit: Payer: Self-pay

## 2023-12-11 ENCOUNTER — Other Ambulatory Visit: Payer: Self-pay

## 2023-12-11 ENCOUNTER — Ambulatory Visit: Payer: Self-pay | Admitting: Family

## 2023-12-11 DIAGNOSIS — I152 Hypertension secondary to endocrine disorders: Secondary | ICD-10-CM

## 2023-12-11 MED ORDER — METOPROLOL SUCCINATE ER 25 MG PO TB24: 25.0000 mg | ORAL_TABLET | Freq: Every day | ORAL | 1 refills | Status: AC

## 2023-12-11 MED ORDER — METOPROLOL SUCCINATE ER 25 MG PO TB24: 25.0000 mg | ORAL_TABLET | Freq: Every day | ORAL | 0 refills | Status: DC

## 2023-12-11 NOTE — Telephone Encounter (Signed)
 Per pcp orders, partial prescription sent to pharmacy provided by patient as [facility] on [address] in [facility].

## 2023-12-11 NOTE — Telephone Encounter (Signed)
 Please send Toprol  xl refill to nearest pharmacy.

## 2023-12-11 NOTE — Telephone Encounter (Signed)
 FYI. Pt going to UC. He is out of town.

## 2023-12-11 NOTE — Telephone Encounter (Signed)
 FYI Only or Action Required?: Action required by provider: medication refill request and clinical question for provider.  Patient was last seen in primary care on 07/18/2023 by David Setter, NP.  Called Nurse Triage reporting Hypertension.  Symptoms began today.  Interventions attempted: Nothing.  Symptoms are: unchanged.  Triage Disposition: See PCP When Office is Open (Within 3 Days)  Patient/caregiver understands and will follow disposition?: Yes   Copied from CRM #[id number]. Topic: Clinical - Red Word Triage >> Dec 11, 2023  1:22 PM [name] wrote: [facility] that prompted transfer to Nurse Triage: Patient wife called in stated he left his medication metoprolol  succinate (TOPROL -XL) 25 MG 24 hr tablet and is out of town . patient stated he is started to feel alil lightheaded , hasnt took it in 2 days Reason for Disposition  Ran out of BP medications    Advised UC now, patient currently out of town  Answer Assessment - Initial Assessment Questions Advised UC today and ED if symptoms worsen. Reports will go to nearest UC, requesting refill and call back.  Could send to pharmacy on file? Patient's wife will call back with nearby pharmacy information.  Out of town in [place] .Been out of Metoprolol  for 2 days; no refills available or emergency from pharmacy.  Pt's wife reports patient feels a little lightheadedness  1. BLOOD PRESSURE: What is your blood pressure? Did you take at least two measurements 5 minutes apart?     Unable check 2. ONSET: When did you take your blood pressure?     This morning, not really 3. HOW: How did you take your blood pressure? (e.g., automatic home BP monitor, visiting nurse)     Unable check 4. HISTORY: Do you have a history of high blood pressure?     htn 5. MEDICINES: Are you taking any medicines for blood pressure? Have you missed any doses recently?     Metoprolol  6. OTHER SYMPTOMS: Do you have any symptoms?  (e.g., blurred vision, chest pain, difficulty breathing, headache, weakness)     Denies blurred vision, dizziness, chest pain,diff breathing , HA, weakness  Protocols used: Blood Pressure - High-A-AH

## 2023-12-24 ENCOUNTER — Ambulatory Visit: Attending: Cardiology

## 2023-12-26 ENCOUNTER — Telehealth: Payer: Self-pay | Admitting: *Deleted

## 2023-12-26 NOTE — Telephone Encounter (Signed)
 Returned call to the pat/wife to reschedule missed appointment on yesterday,  there was and no voicemail about to left as one line it is not set up and the other never came on.

## 2023-12-31 ENCOUNTER — Encounter: Payer: Self-pay | Admitting: Family

## 2023-12-31 DIAGNOSIS — M109 Gout, unspecified: Secondary | ICD-10-CM

## 2024-01-02 ENCOUNTER — Ambulatory Visit
Admission: EM | Admit: 2024-01-02 | Discharge: 2024-01-02 | Disposition: A | Discharge status: Home / Self Care | Attending: Family Medicine | Admitting: Family Medicine

## 2024-01-02 ENCOUNTER — Other Ambulatory Visit (HOSPITAL_BASED_OUTPATIENT_CLINIC_OR_DEPARTMENT_OTHER): Payer: Self-pay

## 2024-01-02 DIAGNOSIS — R300 Dysuria: Secondary | ICD-10-CM | POA: Insufficient documentation

## 2024-01-02 DIAGNOSIS — N3001 Acute cystitis with hematuria: Secondary | ICD-10-CM | POA: Insufficient documentation

## 2024-01-02 LAB — POCT URINE DIPSTICK
Glucose, UA: >=1000 mg/dL — AB
Nitrite, UA: NEGATIVE
POC PROTEIN,UA: >=300 — AB
Spec Grav, UA: 1.015 (ref 1.010–1.025)
Urobilinogen, UA: 1 U/dL
pH, UA: 5.5 (ref 5.0–8.0)

## 2024-01-02 MED ORDER — CEFPODOXIME PROXETIL 100 MG PO TABS
100.0000 mg | ORAL_TABLET | Freq: Two times a day (BID) | ORAL | 0 refills | Status: DC
  Filled 2024-01-02: qty 14, 7d supply, fill #0

## 2024-01-02 NOTE — ED Triage Notes (Signed)
 Pt present with c.o blood in urine and states he found a clot this morning. Reports frequent urination and denies pain.

## 2024-01-02 NOTE — ED Provider Notes (Signed)
 [facility]    CSN: [id number] Arrival date & time: 01/02/24  9051      History   Chief Complaint No chief complaint on file.   HPI [name] is a 78 y.o. male presents for hematuria.  Patient reports 3 days of hematuria with frequent urination.  He denies dysuria, urgency, fevers, nausea/vomiting, flank pain.  No penile discharge.  Has a history of UTIs last treated in July with resolution of symptoms on Vantin .  He does take Coumadin  and INR 2.8 on November 30, 2023.  No other concerns at this time  HPI  Past Medical History:  Diagnosis Date   Actinic keratosis 02/14/2011   Allergy    Aneurysm of ascending aorta without rupture 01/06/2022   Aortic stenosis    Arthritis    Bilateral sensorineural hearing loss 06/16/2009   CAD (coronary artery disease)    cabg   Complication of anesthesia    Coronary atherosclerosis of native coronary artery 06/15/2009   Diabetic polyneuropathy 11/11/2018   Dyslipidemia 11/05/2018   Embolic stroke involving left middle cerebral artery  05/30/2021   Erectile dysfunction 05/18/2009   Essential hypertension 06/16/2009   Expressive aphasia 06/25/2021   External hemorrhoids 03/21/2010   Gout 04/09/2015   Heart murmur    Hemochromatosis 02/14/2011   History of aortic valve replacement 10/13/2009   History of CABG 11/05/2018   2005- 1. Coronary artery bypass grafting x4 with LIMA-LAD, SVG-OM1, SVG-AM and dCFX.  2. Aortic valve replacement with [employer] AVR 3. Septal myomectomy.  4. Myoview low risk 2016   History of hepatitis 01/31/1972   Hypertriglyceridemia 06/13/2010   Hypertrophic obstructive cardiomyopathy 05/06/2008   SP septal myomectomy at surgery 2005 Echo Nov 2018- Hyperdynamic LVEF with severe basal septal hypertrophy. There is chordal SAM with resting gradient of 16 mmHg that increases to 85 mmHg with Valsalva        Leg weakness, bilateral 01/18/2021   Long term (current) use of anticoagulants 05/22/2018   Lumbar  stenosis with neurogenic claudication 02/04/2021   Malignant neoplasm of prostate 06/17/2009   Mechanical heart valve present    Manufacturer: Deitra Rosemary Blade #: [id number]  Model #: [id number]. Card states MRI compatible with 3 teslas or less.   Obesity, unspecified 04/29/2008   OSA (obstructive sleep apnea) 07/12/2012   moderate-severe; uses CPAP nightly   PONV (postoperative nausea and vomiting)    after CABG- slow to wake up   Presence of prosthetic heart valve    Primary osteoarthritis of left shoulder 10/31/2018   S/P lumbar laminectomy 02/04/2021   Stage 3 chronic kidney disease due to type 2 diabetes mellitus 11/11/2018   Tobacco use 06/16/2009   Type 2 diabetes mellitus with hyperglycemia    Ventral hernia 03/03/2014    Patient Active Problem List   Diagnosis Date Noted   S/P reverse total shoulder arthroplasty, left 08/07/2023   Pre-op evaluation 01/17/2023   Left shoulder pain 11/17/2022   Hypertrophic cardiomyopathy (HCC) 08/12/2022   Chronic anticoagulation 08/12/2022   Hypotension 04/27/2022   H/O: CVA (cerebrovascular accident) 03/14/2022   Urinary retention 01/26/2022   Acute ischemic stroke (HCC) 01/24/2022   Middle cerebral artery embolism, left 01/24/2022   Aneurysm of ascending aorta without rupture 01/06/2022   Type 2 diabetes mellitus with stage 3a chronic kidney disease, with long-term current use of insulin  (HCC) 10/30/2021   Type 2 diabetes mellitus with diabetic polyneuropathy, with long-term current use of insulin  (HCC) 10/30/2021   NSTEMI (non-ST  elevated myocardial infarction) (HCC) 10/09/2021   Paroxysmal atrial fibrillation (HCC) 10/09/2021   Presence of prosthetic heart valve    Type 2 diabetes mellitus with hyperglycemia    Expressive aphasia 06/25/2021   History of CVA (cerebrovascular accident) 05/2021   S/P lumbar laminectomy 02/04/2021   Lumbar stenosis with neurogenic claudication 02/04/2021   History of falling 01/30/2021   Diabetic  polyneuropathy 11/11/2018   Stage 3 chronic kidney disease due to type 2 diabetes mellitus 11/11/2018   History of CABG 11/05/2018   Dyslipidemia 11/05/2018   Primary osteoarthritis, left shoulder 10/31/2018   Long term (current) use of anticoagulants 05/22/2018   Ventral hernia 03/03/2014   OSA (obstructive sleep apnea) 07/12/2012   Hemochromatosis 02/14/2011   Actinic keratosis 02/14/2011   Hypertriglyceridemia 06/13/2010   External hemorrhoids 03/21/2010   History of aortic valve replacement 10/13/2009   Malignant neoplasm of prostate (HCC) 06/17/2009   Bilateral sensorineural hearing loss 06/16/2009   Essential hypertension 06/16/2009   Coronary atherosclerosis of native coronary artery 06/15/2009   Erectile dysfunction 05/18/2009   Obesity, unspecified 04/29/2008    Past Surgical History:  Procedure Laterality Date   AORTIC VALVE REPLACEMENT  11/14/2003   [facility]   APPENDECTOMY  1990   CHOLECYSTECTOMY  1990   CORONARY ANGIOGRAPHY  10/11/2021   CORONARY ARTERY BYPASS GRAFT  10/2003   HERNIA REPAIR  1999   right, inguinal   HERNIA REPAIR  2002   left, inguinal   HIP SURGERY  2006   right hip   IR CT HEAD LTD  02/03/2022   IR PERCUTANEOUS ART THROMBECTOMY/INFUSION INTRACRANIAL INC DIAG ANGIO  01/24/2022   LUMBAR LAMINECTOMY/DECOMPRESSION MICRODISCECTOMY Bilateral 02/04/2021   Procedure: Bilateral Lumbar Two-Three Laminectomy;  Surgeon: Colon Shove, MD;  Location: [facility] OR;  Service: Neurosurgery;  Laterality: Bilateral;  3C/RM 20   PILONIDAL CYST EXCISION  1964   prostate seed implant  03/2010   RADIOLOGY WITH ANESTHESIA N/A 01/24/2022   Procedure: IR WITH ANESTHESIA;  Surgeon: Radiologist, Medication, MD;  Location: [facility] OR;  Service: Radiology;  Laterality: N/A;   REVERSE SHOULDER ARTHROPLASTY Left 08/07/2023   Procedure: ARTHROPLASTY, SHOULDER, TOTAL, REVERSE;  Surgeon: Josefina Chew, MD;  Location: [facility] ORS;  Service: Orthopedics;  Laterality: Left;   TEE WITHOUT  CARDIOVERSION N/A 03/14/2022   Procedure: TRANSESOPHAGEAL ECHOCARDIOGRAM (TEE);  Surgeon: Loni Soyla LABOR, MD;  Location: [facility] ENDOSCOPY;  Service: Cardiology;  Laterality: N/A;   TONSILLECTOMY  childhood       Home Medications    Prior to Admission medications   Medication Sig Start Date End Date Taking? Authorizing Provider  cefpodoxime  (VANTIN ) 100 MG tablet Take 1 tablet (100 mg total) by mouth 2 (two) times daily for 7 days. 01/02/24 01/09/24 Yes Loreda Myla SAUNDERS, NP  AMBULATORY NON FORMULARY MEDICATION cpap cushions            AirFit F20 (Size: Large)           AirFit F30 (Size: Med)   Please FAX the prescription to:  [phone] 02/10/20   O'Sullivan, [name], NP  aspirin  EC 81 MG tablet Take 1 tablet (81 mg total) by mouth daily. Swallow whole. 04/14/22   Ghimire, Donalda HERO, MD  BD PEN NEEDLE NANO 2ND GEN 32G X 4 MM MISC 1 DEVICE BY DOES NOT APPLY ROUTE IN THE MORNING, AT NOON, IN THE EVENING, AND AT BEDTIME. 04/23/23   Shamleffer, Ibtehal Jaralla, MD  Blood Glucose Monitoring Suppl Supplies MISC Use for monitoring glucose level 08/05/18  Daryl Setter, NP  chlorhexidine  (HIBICLENS ) 4 % external liquid Apply 15 mLs (1 Application total) topically as directed for 30 doses. Use as directed daily for 5 days every other week for 6 weeks. 08/07/23   Brown, Blaine K, PA-C  Continuous Blood Gluc Sensor (DEXCOM G6 SENSOR) MISC 1 Device by Does not apply route as directed. 05/18/20   Shamleffer, Ibtehal Jaralla, MD  Continuous Glucose Receiver (DEXCOM G7 RECEIVER) DEVI 1 Device by Does not apply route continuous. 09/25/23   Shamleffer, Donell Cardinal, MD  dapagliflozin  propanediol (FARXIGA ) 10 MG TABS tablet TAKE 1 TABLET BY MOUTH DAILY BEFORE BREAKFAST. 11/22/23   Shamleffer, Ibtehal Jaralla, MD  enoxaparin  (LOVENOX ) 100 MG/ML injection Inject 1 mL (100 mg total) into the skin every 12 (twelve) hours. Patient not taking: Reported on 11/07/2023 08/03/23   Pietro Redell RAMAN, MD  fenofibrate   (TRICOR ) 145 MG tablet TAKE 1 TABLET BY MOUTH EVERY DAY 07/12/23   Webb, Padonda B, FNP  furosemide  (LASIX ) 40 MG tablet TAKE 1 TABLET BY MOUTH EVERY DAY 10/10/23   Pietro Redell RAMAN, MD  glucose blood [facility] VERIO) test strip Use as instructed 09/15/20   Daryl Setter, NP  insulin  aspart (NOVOLOG  FLEXPEN) 100 UNIT/ML FlexPen MAX DAILY 75 UNITS 12/04/23   Shamleffer, Ibtehal Jaralla, MD  Insulin  Glargine (BASAGLAR  KWIKPEN) 100 UNIT/ML INJECT 22 UNITS INTO THE SKIN DAILY. Patient taking differently: Inject 16 Units into the skin every evening. 06/19/23   Shamleffer, Ibtehal Jaralla, MD  isosorbide  mononitrate (IMDUR ) 30 MG 24 hr tablet TAKE 1 TABLET BY MOUTH EVERY DAY 08/22/23   Pietro Redell RAMAN, MD  ketoconazole (NIZORAL) 2 % cream Apply 1 Application topically daily as needed for irritation.    [provider]  methocarbamol  (ROBAXIN ) 500 MG tablet Take 1 tablet (500 mg total) by mouth every 8 (eight) hours as needed for muscle spasms. 08/08/23   Brown, Blaine K, PA-C  metoprolol  succinate (TOPROL -XL) 25 MG 24 hr tablet Take 1 tablet (25 mg total) by mouth daily. 12/11/23   O'Sullivan, [name], NP  metoprolol  succinate (TOPROL -XL) 25 MG 24 hr tablet Take 1 tablet (25 mg total) by mouth daily. 12/11/23   O'Sullivan, [name], NP  ondansetron  (ZOFRAN ) 4 MG tablet Take 1 tablet (4 mg total) by mouth every 8 (eight) hours as needed for nausea or vomiting. 08/08/23   Brown, Blaine K, PA-C  OneTouch Delica Lancets 33G MISC USE AS DIRECTED 09/15/20   Daryl Setter, NP  oxyCODONE  (ROXICODONE ) 5 MG immediate release tablet Take 1 tablet (5 mg total) by mouth every 4 (four) hours as needed for severe pain (pain score 7-10). 08/08/23   Brown, Blaine K, PA-C  predniSONE  (DELTASONE ) 10 MG tablet Take 3 tablets (30 mg total) by mouth daily with breakfast. 10/18/23   Christopher Savannah, PA-C  PRESCRIPTION MEDICATION CPAP- At bedtime    [provider]  rosuvastatin  (CRESTOR ) 40 MG tablet TAKE 1 TABLET  BY MOUTH EVERY DAY 10/18/23   Emelia Josefa HERO, NP  Semaglutide , 2 MG/DOSE, (OZEMPIC , 2 MG/DOSE,) 8 MG/3ML SOPN INJECT 2 MG AS DIRECTED ONCE A WEEK. 09/20/23   Shamleffer, Ibtehal Jaralla, MD  sennosides-docusate sodium  (SENOKOT-S) 8.6-50 MG tablet Take 2 tablets by mouth daily. Patient not taking: Reported on 11/07/2023 08/08/23   Brown, Blaine K, PA-C  tacrolimus  (PROTOPIC ) 0.1 % ointment Apply 1 application  topically 2 (two) times daily as needed (for eczema). 11/29/20   [provider]  warfarin (COUMADIN ) 7.5 MG tablet TAKE 1/2 TABLET TO 1 TABLET  BY MOUTH DAILY AS DIRECTED BY THE COUMADIN  CLINIC Patient taking differently: Take 3.75-7.5 mg by mouth See admin instructions. Take 0.5 tablet (3.75 mg) by mouth on Mondays, Wednesdays & Fridays.  Take 1 tablet (7.5 mg) by mouth on Sundays, Tuesdays, Thursdays & Saturdays. 01/17/23   Pietro Redell RAMAN, MD    Family History Family History  Problem Relation Age of Onset   Heart disease Mother    Stroke Mother    Heart disease Father    Asperger's syndrome Son    Hyperlipidemia Son    Coronary artery disease Brother    Cancer Neg Hx        negative for colon cancer    Social History Social History   Tobacco Use   Smoking status: Former    Types: Cigars    Quit date: 06/2021    Years since quitting: 2.5   Smokeless tobacco: Never   Tobacco comments:    Pt states he smokes 1 cigar about once a month. 06/01/2021    Pt quit  Cigars 06/2021  Vaping Use   Vaping status: Never Used  Substance Use Topics   Alcohol use: Not Currently    Comment: wine maybe 1 per month   Drug use: No     Allergies   Patient has no known allergies.   Review of Systems Review of Systems  Genitourinary:  Positive for frequency and hematuria.     Physical Exam Triage Vital Signs ED Triage Vitals  Encounter Vitals Group     BP 01/02/24 0959 (!) 144/66     Girls Systolic BP Percentile --      Girls Diastolic BP Percentile --      Boys  Systolic BP Percentile --      Boys Diastolic BP Percentile --      Pulse Rate 01/02/24 0959 75     Resp 01/02/24 0959 15     Temp 01/02/24 0959 97.8 F (36.6 C)     Temp Source 01/02/24 0959 Oral     SpO2 01/02/24 0959 95 %     Weight --      Height --      Head Circumference --      Peak Flow --      Pain Score 01/02/24 0958 0     Pain Loc --      Pain Education --      Exclude from Growth Chart --    No data found.  Updated Vital Signs BP (!) 144/66   Pulse 75   Temp 97.8 F (36.6 C) (Oral)   Resp 15   SpO2 95%   Visual Acuity Right Eye Distance:   Left Eye Distance:   Bilateral Distance:    Right Eye Near:   Left Eye Near:    Bilateral Near:     Physical Exam Vitals and nursing note reviewed.  Constitutional:      Appearance: Normal appearance.  HENT:     Head: Normocephalic and atraumatic.  Eyes:     Pupils: Pupils are equal, round, and reactive to light.  Cardiovascular:     Rate and Rhythm: Normal rate.  Pulmonary:     Effort: Pulmonary effort is normal.  Abdominal:     Tenderness: There is no right CVA tenderness or left CVA tenderness.  Skin:    General: Skin is warm and dry.  Neurological:     General: No focal deficit present.     Mental Status: He is alert  and oriented to person, place, and time.  Psychiatric:        Mood and Affect: Mood normal.        Behavior: Behavior normal.      UC Treatments / Results  Labs (all labs ordered are listed, but only abnormal results are displayed) Labs Reviewed  POCT URINE DIPSTICK - Abnormal; Notable for the following components:      Result Value   Color, UA other (*)    Clarity, UA turbid (*)    Glucose, UA >=1,000 (*)    Bilirubin, UA large (*)    Ketones, POC UA trace (5) (*)    Blood, UA large (*)    POC PROTEIN,UA >=300 (*)    Leukocytes, UA Large (3+) (*)    All other components within normal limits  URINE CULTURE   POCT INR Order: [id number]  Status: Final result     Next appt:  01/07/2024 at 11:15 AM in Cardiology ([facility])     Dx: History of aortic valve replacement   Test Result Released: Yes (seen)   0 Result Notes          Component Ref Range & Units (hover) 1 mo ago (11/30/23) 2 mo ago (11/01/23) 3 mo ago (10/04/23) 4 mo ago (09/03/23) 4 mo ago (08/13/23) 4 mo ago (08/08/23) 5 mo ago (07/26/23)  INR 2.8 3.1 Abnormal  3.2 Abnormal  3.8 Abnormal  2.8 1.0 R, CM R  POC INR       3.6  Resulting Agency      [facility]         Specimen Collected: 11/30/23 11:41 Last Resulted: 11/30/23 11:41      EKG   Radiology No results found.  Procedures Procedures (including critical care time)  Medications Ordered in UC Medications - No data to display  Initial Impression / Assessment and Plan / UC Course  I have reviewed the triage vital signs and the nursing notes.  Pertinent labs & imaging results that were available during my care of the patient were reviewed by me and considered in my medical decision making (see chart for details).     Reviewed exam and symptoms with patient.  Urine positive for UTI, will send urine culture.  Creatinine clearance is 55 mL/min.  Reviewed culture from previous UTI in July.  Will do Vantin  twice daily for 7 days and advised fluids and PCP follow-up in 2 to 3 days for recheck.  Strict ER precautions reviewed and patient verbalized understanding. Final Clinical Impressions(s) / UC Diagnoses   Final diagnoses:  Dysuria  Acute cystitis with hematuria     Discharge Instructions      My clinic will contact you with results of urine culture done today if positive.  Please start Vantin  antibiotic twice daily for 7 days.  Follow-up with your PCP in 2 to 3 days for recheck.  Please go to the ER for any worsening symptoms.  Hope you feel better soon!     ED Prescriptions     Medication Sig Dispense Auth. Provider   cefpodoxime  (VANTIN ) 100 MG tablet Take 1 tablet (100 mg total) by mouth 2 (two) times  daily for 7 days. 14 tablet Nolen Lindamood, Jodi R, NP      PDMP not reviewed this encounter.   Loreda Myla SAUNDERS, NP 01/02/24 1037

## 2024-01-02 NOTE — Discharge Instructions (Addendum)
 My clinic will contact you with results of urine culture done today if positive.  Please start Vantin  antibiotic twice daily for 7 days.  Follow-up with your PCP in 2 to 3 days for recheck.  Please go to the ER for any worsening symptoms.  Hope you feel better soon!

## 2024-01-03 NOTE — Progress Notes (Unsigned)
 HPI: Followup of coronary artery disease. Patient is status post coronary artery bypassing graft (LIMA to the LAD, saphenous vein graft to the marginal, saphenous vein graft sequentially to the right coronary artery and distal circumflex) as well as aortic valve replacement (St. Jude aortic valve) in 2005. He also had a septal myectomy at that time. Abdominal CT April 2015 showed no aneurysm. Had CVA May 2023. Patient had non-ST elevation myocardial infarction September 2023 in [place] .  Patient presented with atrial fibrillation at that time.  Cardiac catheterization revealed severe three-vessel coronary artery disease; patent LIMA to the LAD with 80% stenosis in the very distal LAD; occluded saphenous vein graft to the left circumflex/right coronary artery and occluded saphenous vein graft to the OM; treated medically. Patient had CVA December 2023.  CTA showed occlusion of left M1.  He had mechanical thrombectomy. Transesophageal echocardiogram February 2024 showed normal LV function, mild left atrial enlargement, mild right atrial enlargement, mild mitral regurgitation, status post aortic valve replacement with trace aortic insufficiency, mean gradient 10.4 mmHg, dilated ascending aorta at 45 mm, moderate aortic plaque.  Had recurrent CVA March 2024.  INR was noted to be 2.2.  Brain MRI showed tiny cortical infarct of the left frontal operculum.  Carotid Dopplers March 2024 showed 1 to 39% bilateral stenosis.  Echocardiogram March 2024 showed ejection fraction 60 to 65%, trace mitral regurgitation status post AVR with gradients not measured.  Goal INR was increased to 3-3.5.  CTA October 2025 showed 4.3 cm thoracic aortic aneurysm.  Since last seen,   Current Outpatient Medications  Medication Sig Dispense Refill   AMBULATORY NON FORMULARY MEDICATION cpap cushions            AirFit F20 (Size: Large)           AirFit F30 (Size: Med)   Please FAX the prescription to:  [phone] each  prn   aspirin  EC 81 MG tablet Take 1 tablet (81 mg total) by mouth daily. Swallow whole. 30 tablet 12   BD PEN NEEDLE NANO 2ND GEN 32G X 4 MM MISC 1 DEVICE BY DOES NOT APPLY ROUTE IN THE MORNING, AT NOON, IN THE EVENING, AND AT BEDTIME. 400 each 3   Blood Glucose Monitoring Suppl Supplies MISC Use for monitoring glucose level 100 each 1   cefpodoxime  (VANTIN ) 100 MG tablet Take 1 tablet (100 mg total) by mouth 2 (two) times daily for 7 days. 14 tablet 0   chlorhexidine  (HIBICLENS ) 4 % external liquid Apply 15 mLs (1 Application total) topically as directed for 30 doses. Use as directed daily for 5 days every other week for 6 weeks. 946 mL 1   Continuous Blood Gluc Sensor (DEXCOM G6 SENSOR) MISC 1 Device by Does not apply route as directed. 9 each 3   Continuous Glucose Receiver (DEXCOM G7 RECEIVER) DEVI 1 Device by Does not apply route continuous.     dapagliflozin  propanediol (FARXIGA ) 10 MG TABS tablet TAKE 1 TABLET BY MOUTH DAILY BEFORE BREAKFAST. 90 tablet 2   enoxaparin  (LOVENOX ) 100 MG/ML injection Inject 1 mL (100 mg total) into the skin every 12 (twelve) hours. (Patient not taking: Reported on 11/07/2023) 20 mL 1   fenofibrate  (TRICOR ) 145 MG tablet TAKE 1 TABLET BY MOUTH EVERY DAY 90 tablet 1   furosemide  (LASIX ) 40 MG tablet TAKE 1 TABLET BY MOUTH EVERY DAY 90 tablet 1   glucose blood (ONETOUCH VERIO) test strip Use as instructed 100 strip 12   insulin   aspart (NOVOLOG  FLEXPEN) 100 UNIT/ML FlexPen MAX DAILY 75 UNITS 45 mL 3   Insulin  Glargine (BASAGLAR  KWIKPEN) 100 UNIT/ML INJECT 22 UNITS INTO THE SKIN DAILY. (Patient taking differently: Inject 16 Units into the skin every evening.) 45 mL 0   isosorbide  mononitrate (IMDUR ) 30 MG 24 hr tablet TAKE 1 TABLET BY MOUTH EVERY DAY 90 tablet 3   ketoconazole (NIZORAL) 2 % cream Apply 1 Application topically daily as needed for irritation.     methocarbamol  (ROBAXIN ) 500 MG tablet Take 1 tablet (500 mg total) by mouth every 8 (eight) hours as needed  for muscle spasms. 30 tablet 0   metoprolol  succinate (TOPROL -XL) 25 MG 24 hr tablet Take 1 tablet (25 mg total) by mouth daily. 7 tablet 0   metoprolol  succinate (TOPROL -XL) 25 MG 24 hr tablet Take 1 tablet (25 mg total) by mouth daily. 90 tablet 1   ondansetron  (ZOFRAN ) 4 MG tablet Take 1 tablet (4 mg total) by mouth every 8 (eight) hours as needed for nausea or vomiting. 10 tablet 0   OneTouch Delica Lancets 33G MISC USE AS DIRECTED 100 each 1   oxyCODONE  (ROXICODONE ) 5 MG immediate release tablet Take 1 tablet (5 mg total) by mouth every 4 (four) hours as needed for severe pain (pain score 7-10). 30 tablet 0   predniSONE  (DELTASONE ) 10 MG tablet Take 3 tablets (30 mg total) by mouth daily with breakfast. 15 tablet 0   PRESCRIPTION MEDICATION CPAP- At bedtime     rosuvastatin  (CRESTOR ) 40 MG tablet TAKE 1 TABLET BY MOUTH EVERY DAY 90 tablet 3   Semaglutide , 2 MG/DOSE, (OZEMPIC , 2 MG/DOSE,) 8 MG/3ML SOPN INJECT 2 MG AS DIRECTED ONCE A WEEK. 3 mL 3   sennosides-docusate sodium  (SENOKOT-S) 8.6-50 MG tablet Take 2 tablets by mouth daily. (Patient not taking: Reported on 11/07/2023) 30 tablet 1   tacrolimus  (PROTOPIC ) 0.1 % ointment Apply 1 application  topically 2 (two) times daily as needed (for eczema).     warfarin (COUMADIN ) 7.5 MG tablet TAKE 1/2 TABLET TO 1 TABLET BY MOUTH DAILY AS DIRECTED BY THE COUMADIN  CLINIC (Patient taking differently: Take 3.75-7.5 mg by mouth See admin instructions. Take 0.5 tablet (3.75 mg) by mouth on Mondays, Wednesdays & Fridays.  Take 1 tablet (7.5 mg) by mouth on Sundays, Tuesdays, Thursdays & Saturdays.) 85 tablet 0   No current facility-administered medications for this visit.     Past Medical History:  Diagnosis Date   Actinic keratosis 02/14/2011   Allergy    Aneurysm of ascending aorta without rupture 01/06/2022   Aortic stenosis    Arthritis    Bilateral sensorineural hearing loss 06/16/2009   CAD (coronary artery disease)    cabg   Complication  of anesthesia    Coronary atherosclerosis of native coronary artery 06/15/2009   Diabetic polyneuropathy 11/11/2018   Dyslipidemia 11/05/2018   Embolic stroke involving left middle cerebral artery  05/30/2021   Erectile dysfunction 05/18/2009   Essential hypertension 06/16/2009   Expressive aphasia 06/25/2021   External hemorrhoids 03/21/2010   Gout 04/09/2015   Heart murmur    Hemochromatosis 02/14/2011   History of aortic valve replacement 10/13/2009   History of CABG 11/05/2018   2005- 1. Coronary artery bypass grafting x4 with LIMA-LAD, SVG-OM1, SVG-AM and dCFX.  2. Aortic valve replacement with St Jude AVR 3. Septal myomectomy.  4. Myoview low risk 2016   History of hepatitis 01/31/1972   Hypertriglyceridemia 06/13/2010   Hypertrophic obstructive cardiomyopathy 05/06/2008   SP septal  myomectomy at surgery 2005 Echo Nov 2018- Hyperdynamic LVEF with severe basal septal hypertrophy. There is chordal SAM with resting gradient of 16 mmHg that increases to 85 mmHg with Valsalva        Leg weakness, bilateral 01/18/2021   Long term (current) use of anticoagulants 05/22/2018   Lumbar stenosis with neurogenic claudication 02/04/2021   Malignant neoplasm of prostate 06/17/2009   Mechanical heart valve present    Manufacturer: Deitra Rosemary Blade #: [id number]  Model #: [id number]. Card states MRI compatible with 3 teslas or less.   Obesity, unspecified 04/29/2008   OSA (obstructive sleep apnea) 07/12/2012   moderate-severe; uses CPAP nightly   PONV (postoperative nausea and vomiting)    after CABG- slow to wake up   Presence of prosthetic heart valve    Primary osteoarthritis of left shoulder 10/31/2018   S/P lumbar laminectomy 02/04/2021   Stage 3 chronic kidney disease due to type 2 diabetes mellitus 11/11/2018   Tobacco use 06/16/2009   Type 2 diabetes mellitus with hyperglycemia    Ventral hernia 03/03/2014    Past Surgical History:  Procedure Laterality Date   AORTIC VALVE  REPLACEMENT  11/14/2003   [employer] Jude [facility]   APPENDECTOMY  1990   CHOLECYSTECTOMY  1990   CORONARY ANGIOGRAPHY  10/11/2021   CORONARY ARTERY BYPASS GRAFT  10/2003   HERNIA REPAIR  1999   right, inguinal   HERNIA REPAIR  2002   left, inguinal   HIP SURGERY  2006   right hip   IR CT HEAD LTD  02/03/2022   IR PERCUTANEOUS ART THROMBECTOMY/INFUSION INTRACRANIAL INC DIAG ANGIO  01/24/2022   LUMBAR LAMINECTOMY/DECOMPRESSION MICRODISCECTOMY Bilateral 02/04/2021   Procedure: Bilateral Lumbar Two-Three Laminectomy;  Surgeon: Colon Shove, MD;  Location: [facility] OR;  Service: Neurosurgery;  Laterality: Bilateral;  3C/RM 20   PILONIDAL CYST EXCISION  1964   prostate seed implant  03/2010   RADIOLOGY WITH ANESTHESIA N/A 01/24/2022   Procedure: IR WITH ANESTHESIA;  Surgeon: Radiologist, Medication, MD;  Location: [facility] OR;  Service: Radiology;  Laterality: N/A;   REVERSE SHOULDER ARTHROPLASTY Left 08/07/2023   Procedure: ARTHROPLASTY, SHOULDER, TOTAL, REVERSE;  Surgeon: Josefina Chew, MD;  Location: [facility] ORS;  Service: Orthopedics;  Laterality: Left;   TEE WITHOUT CARDIOVERSION N/A 03/14/2022   Procedure: TRANSESOPHAGEAL ECHOCARDIOGRAM (TEE);  Surgeon: Loni Soyla LABOR, MD;  Location: [facility] ENDOSCOPY;  Service: Cardiology;  Laterality: N/A;   TONSILLECTOMY  childhood    Social History   Socioeconomic History   Marital status: Married    Spouse name: Not on file   Number of children: Not on file   Years of education: 44   Highest education level: Master's degree (e.g., MA, MS, MEng, MEd, MSW, MBA)  Occupational History   Occupation: Retired  Tobacco Use   Smoking status: Former    Types: Cigars    Quit date: 06/2021    Years since quitting: 2.5   Smokeless tobacco: Never   Tobacco comments:    Pt states he smokes 1 cigar about once a month. 06/01/2021    Pt quit  Cigars 06/2021  Vaping Use   Vaping status: Never Used  Substance and Sexual Activity   Alcohol use: Not Currently    Comment: wine maybe  1 per month   Drug use: No   Sexual activity: Never  Other Topics Concern   Not on file  Social History Narrative   ** Merged History Encounter ** Right Handed    Lives in  a one sotyr home. One step leads to front     Lives with wife   Retired   Occasionally caffeine   Social Drivers of Corporate Investment Banker Strain: Low Risk  (07/13/2023)   Overall Financial Resource Strain (CARDIA)    Difficulty of Paying Living Expenses: Not very hard  Food Insecurity: Food Insecurity Present (08/07/2023)   Hunger Vital Sign    Worried About Running Out of Food in the Last Year: Sometimes true    Ran Out of Food in the Last Year: Never true  Transportation Needs: No Transportation Needs (08/07/2023)   PRAPARE - Administrator, Civil Service (Medical): No    Lack of Transportation (Non-Medical): No  Physical Activity: Insufficiently Active (07/13/2023)   Exercise Vital Sign    Days of Exercise per Week: 3 days    Minutes of Exercise per Session: 20 min  Stress: No Stress Concern Present (07/13/2023)   [employer] of Occupational Health - Occupational Stress Questionnaire    Feeling of Stress: Not at all  Social Connections: Moderately Integrated (08/07/2023)   Social Connection and Isolation Panel    Frequency of Communication with Friends and Family: Twice a week    Frequency of Social Gatherings with Friends and Family: Once a week    Attends Religious Services: More than 4 times per year    Active Member of [employer] or Organizations: No    Attends Banker Meetings: Never    Marital Status: Married  Catering Manager Violence: Not At Risk (08/07/2023)   Humiliation, Afraid, Rape, and Kick questionnaire    Fear of Current or Ex-Partner: No    Emotionally Abused: No    Physically Abused: No    Sexually Abused: No    Family History  Problem Relation Age of Onset   Heart disease Mother    Stroke Mother    Heart disease Father    Asperger's syndrome Son     Hyperlipidemia Son    Coronary artery disease Brother    Cancer Neg Hx        negative for colon cancer    ROS: no fevers or chills, productive cough, hemoptysis, dysphasia, odynophagia, melena, hematochezia, dysuria, hematuria, rash, seizure activity, orthopnea, PND, pedal edema, claudication. Remaining systems are negative.  Physical Exam: Well-developed well-nourished in no acute distress.  Skin is warm and dry.  HEENT is normal.  Neck is supple.  Chest is clear to auscultation with normal expansion.  Cardiovascular exam is regular rate and rhythm.  Abdominal exam nontender or distended. No masses palpated. Extremities show no edema. neuro grossly intact  ECG- personally reviewed  A/P  1 status post aortic valve replacement-goal INR increased to 3-3.5 given history of recurrent CVAs.  Continue SBE prophylaxis.  2 history of recurrent CVA-previous transesophageal echocardiogram showed normally functioning valve.  Continue Coumadin  with goal INR 3-3.5 and aspirin .  3 history of thoracic aortic aneurysm-most recent CTA showed mildly dilated thoracic aorta at 4.3 cm.  Will plan follow-up echocardiogram October 2026.  4 paroxysmal atrial fibrillation-he is in sinus rhythm.  Continue Coumadin  and beta-blocker.  5 hyperlipidemia-continue statin.  6 hypertension-blood pressure controlled.  Continue present medical regimen.  7 history of hypertrophic cardiomyopathy status post myectomy-continue beta-blocker.  8 coronary artery disease-continue statin.  I previously reviewed his cardiac catheterization films from [place]  with Dr. Burnard and medical therapy was felt to be the best option.  Previous infarct was felt secondary to atrial fibrillation with rapid ventricular  response superimposed on underlying coronary disease.  9 pedal edema-continue Lasix .  Redell Shallow, MD

## 2024-01-05 LAB — URINE CULTURE: Culture: >=100000 — AB

## 2024-01-07 ENCOUNTER — Ambulatory Visit: Attending: Cardiology

## 2024-01-07 ENCOUNTER — Ambulatory Visit: Payer: Self-pay | Admitting: Cardiology

## 2024-01-07 ENCOUNTER — Ambulatory Visit (HOSPITAL_COMMUNITY): Payer: Self-pay

## 2024-01-07 DIAGNOSIS — Z7901 Long term (current) use of anticoagulants: Secondary | ICD-10-CM | POA: Diagnosis not present

## 2024-01-07 DIAGNOSIS — Z952 Presence of prosthetic heart valve: Secondary | ICD-10-CM | POA: Diagnosis not present

## 2024-01-07 LAB — POCT INR: INR: 6.8 — AB (ref 2.0–3.0)

## 2024-01-07 NOTE — Progress Notes (Signed)
 Description   Lab INR 5.9: Called and spoke with patient. Will hold 3 days and increase greens. Then resume 1/2 tablet Monday/Wednesday/Friday, 1 whole tablet all other days of the week  Recheck INR in 2 weeks Coumadin  Clinic [phone].  Clearance Fax # [phone]

## 2024-01-07 NOTE — Patient Instructions (Addendum)
 Description   Lab INR 5.9: Called and spoke with patient. Will hold 3 days and increase greens. Then resume 1/2 tablet Monday/Wednesday/Friday, 1 whole tablet all other days of the week  Recheck INR in 2 weeks Coumadin  Clinic [phone].  Clearance Fax # [phone]

## 2024-01-08 ENCOUNTER — Other Ambulatory Visit (HOSPITAL_BASED_OUTPATIENT_CLINIC_OR_DEPARTMENT_OTHER): Payer: Self-pay

## 2024-01-08 LAB — PROTIME-INR
INR: 5.9 (ref 0.9–1.2)
Prothrombin Time: 62.4 s — ABNORMAL HIGH (ref 9.1–12.0)

## 2024-01-08 MED ORDER — COLCHICINE 0.6 MG PO TABS
ORAL_TABLET | ORAL | 1 refills | Status: AC
  Filled 2024-01-08: qty 6, 3d supply, fill #0

## 2024-01-10 ENCOUNTER — Ambulatory Visit: Attending: Cardiology | Admitting: Cardiology

## 2024-01-10 ENCOUNTER — Encounter: Payer: Self-pay | Admitting: Cardiology

## 2024-01-10 ENCOUNTER — Telehealth (HOSPITAL_BASED_OUTPATIENT_CLINIC_OR_DEPARTMENT_OTHER): Payer: Self-pay

## 2024-01-10 VITALS — BP 117/68 | HR 70 | Ht 68.5 in | Wt 216.6 lb

## 2024-01-10 DIAGNOSIS — I10 Essential (primary) hypertension: Secondary | ICD-10-CM

## 2024-01-10 DIAGNOSIS — E785 Hyperlipidemia, unspecified: Secondary | ICD-10-CM | POA: Diagnosis not present

## 2024-01-10 DIAGNOSIS — I7121 Aneurysm of the ascending aorta, without rupture: Secondary | ICD-10-CM | POA: Diagnosis not present

## 2024-01-10 DIAGNOSIS — I25810 Atherosclerosis of coronary artery bypass graft(s) without angina pectoris: Secondary | ICD-10-CM

## 2024-01-10 DIAGNOSIS — Z952 Presence of prosthetic heart valve: Secondary | ICD-10-CM

## 2024-01-10 NOTE — Patient Instructions (Signed)
   Testing/Procedures:  Your physician has requested that you have an echocardiogram. Echocardiography is a painless test that uses sound waves to create images of your heart. It provides your doctor with information about the size and shape of your heart and how well your heart's chambers and valves are working. This procedure takes approximately one hour. There are no restrictions for this procedure. Please do NOT wear cologne, perfume, aftershave, or lotions (deodorant is allowed). Please arrive 15 minutes prior to your appointment time.  Please note: We ask at that you not bring children with you during ultrasound (echo/ vascular) testing. Due to room size and safety concerns, children are not allowed in the ultrasound rooms during exams. Our front office staff cannot provide observation of children in our lobby area while testing is being conducted. An adult accompanying a patient to their appointment will only be allowed in the ultrasound room at the discretion of the ultrasound technician under special circumstances. We apologize for any inconvenience. [address]  Follow-Up: At [facility], you and your health needs are our priority.  As part of our continuing mission to provide you with exceptional heart care, our providers are all part of one team.  This team includes your primary Cardiologist (physician) and Advanced Practice Providers or APPs (Physician Assistants and Nurse Practitioners) who all work together to provide you with the care you need, when you need it.  Your next appointment:   6 month(s)  Provider:   REDELL SHALLOW MD

## 2024-01-10 NOTE — Telephone Encounter (Signed)
° °  Pre-operative Risk Assessment    Patient Name: [name]  DOB: 17-Oct-1945 MRN: [id number]   Date of last office visit: 06/06/23 with Emelia Date of next office visit: 01/10/24 with Dr. Pietro  Request for Surgical Clearance    Procedure:  Dental Extraction - Amount of Teeth to be Pulled:  1- simple  Date of Surgery:  Clearance TBD                                 Surgeon:  NA Surgeon's Group or Practice Name:  [facility] Phone number:  [phone] Fax number:  [phone]   Type of Clearance Requested:   - Medical  - Pharmacy:  Hold Aspirin , Warfarin (Coumadin ), and Lovenox   not indicated   Type of Anesthesia:  Local    Additional requests/questions:    Bonney Augustin JONETTA Delores   01/10/2024, 8:26 AM

## 2024-01-10 NOTE — Telephone Encounter (Signed)
° ° °  Primary Cardiologist: None  Chart reviewed as part of pre-operative protocol coverage. Simple dental extractions are considered low risk procedures per guidelines and generally do not require any specific cardiac clearance. It is also generally accepted that for simple extractions and dental cleanings, there is no need to interrupt blood thinner therapy.   SBE prophylaxis is required for the patient.  I will route this recommendation to the requesting party via Epic fax function and remove from pre-op pool.  Please call with questions.  Lum LITTIE Louis, NP 01/10/2024, 9:07 AM

## 2024-01-14 DIAGNOSIS — G4733 Obstructive sleep apnea (adult) (pediatric): Secondary | ICD-10-CM | POA: Diagnosis not present

## 2024-01-14 DIAGNOSIS — I10 Essential (primary) hypertension: Secondary | ICD-10-CM | POA: Diagnosis not present

## 2024-01-15 ENCOUNTER — Other Ambulatory Visit (HOSPITAL_BASED_OUTPATIENT_CLINIC_OR_DEPARTMENT_OTHER): Payer: Self-pay

## 2024-01-15 DIAGNOSIS — E119 Type 2 diabetes mellitus without complications: Secondary | ICD-10-CM | POA: Diagnosis not present

## 2024-01-21 ENCOUNTER — Ambulatory Visit: Attending: Cardiology | Admitting: *Deleted

## 2024-01-21 DIAGNOSIS — Z7901 Long term (current) use of anticoagulants: Secondary | ICD-10-CM

## 2024-01-21 DIAGNOSIS — Z952 Presence of prosthetic heart valve: Secondary | ICD-10-CM | POA: Diagnosis not present

## 2024-01-21 LAB — POCT INR: INR: 3.3 — AB (ref 2.0–3.0)

## 2024-01-21 NOTE — Progress Notes (Signed)
 Description   INR-3.3; Continue taking warfarin 1 tablets daily except 1/2 tablet Monday, Wednesday, and Friday.  Recheck INR in 3 weeks Coumadin  Clinic [phone].  Clearance Fax # [phone]

## 2024-01-21 NOTE — Patient Instructions (Signed)
 Description   INR-3.3; Continue taking warfarin 1 tablets daily except 1/2 tablet Monday, Wednesday, and Friday.  Recheck INR in 3 weeks Coumadin  Clinic [phone].  Clearance Fax # [phone]

## 2024-01-26 ENCOUNTER — Other Ambulatory Visit: Payer: Self-pay | Admitting: Internal Medicine

## 2024-02-06 NOTE — Progress Notes (Signed)
 "   Memory difficulty   JAKOBEE BRACKINS is a delightful 79 y.o. RH male with a history of hypertension, hyperlipidemia, DM2, prior history of CVA while on Coumadin  with dysarthria and B leg weakness, OSA, St 3 CKD, CAD s/p CABG, s/p AVR on Plavix , h/o prostate cancer, hemochromatosis, presenting today in follow-up for evaluation of memory concerns.  Last neuropsych evaluation in August 2023 was largely within normal limits relative to age-matched peers.  He is not on antidementia medication as this was not indicated at that time.  Patient was last seen on 09/07/2022 . Memory is quite stable despite subjective concerns, with an MMSE today of 30/30  Patient is able to participate on ADLs and to drive short distances without difficulties. Mood is good. This patient is accompanied in the office by his wife  who supplements the history. Previous records as well as any outside records available were reviewed prior to todays visit   Continue to control mood as per PCP Continue using the hearing aids in an effort to improve comprehension Recommend increasing physical activity and socialization Continue using CPAP for OSA Recommend good control of cardiovascular risk factors Pending on the neuropsych report, if indicated will consider ACHI       Discussed the use of AI scribe software for clinical note transcription with the patient, who gave verbal consent to proceed.  History of Present Illness Cherokee E Soots is a 79 year old male seen today for memory loss  He reports subjective decline in both short-term and long-term memory since his last visit in August 2024. He struggles with remembering recent conversations and names, and sometimes forgets what he is doing in the moment. He repeats questions or topics within the same day. There are no issues with recognizing faces or places, but he occasionally struggles with word-finding, which he attributes to aphasia from previous strokes. He has not engaged  in speech therapy since his strokes. He experiences frustration and irritability, which his caregiver notes as mood changes. He has not sought psychotherapy for these mood changes.  He uses a CPAP machine consistently for sleep apnea and reports good sleep quality, waking once per night to use the bathroom. No hallucinations, paranoia, or vivid dreams are present. His appetite is good, and he continues to cook, using printed recipes to aid memory.   He experiences balance issues and has persistent neuropathy in his feet despite previous physical therapy. He performs exercises at home. He uses a cane for stability but no longer uses a walker.  Mild dizziness occurs when bending over, affecting his ability to tie shoes, no vertigo or headaches. He has a history of mild residual right-sided weakness and minimal chronic intention right greater than left tremors, particularly when holding objects, which he manages with exercises.  He underwent shoulder replacement surgery in July and has recovered well, performing exercises to maintain function.  He has a history of urinary retention and previously used an indwelling catheter for three months, followed by self-catheterization. He currently urinates frequently and uses incontinence products at night. He has had recurrent urinary tract infections, with the last episode in December 2024. No bowel issues are reported.  He does not drive due to neuropathy but can drive locally with appropriate footwear.   Neuropsych evaluation 09/02/21 Dr. Richie  Briefly, results suggested neuropsychological functioning largely within normal limits relative to age-matched peers. He exhibited mild performance variability across executive functioning and semantic fluency. Specific to the latter, all other assessments of expressive  language were strong. Performances were likewise appropriate across processing speed, attention/concentration, safety/judgment, receptive language,  visuospatial abilities, and all aspects of learning and memory. Given Mr. Bergey's handedness and the location of his May 2023 infarct (left MCA with frontal cortex involvement), some weaknesses in executive functioning and expressive language would be an expected finding. Current scores reflect an expected pattern of weakness and are likely directly related to this event. Relative to his wife's pre-stroke concerns surrounding memory decline, day-to-day dysfunction can likely be accounted for by significant and largely uncorrected hearing loss.       Past Medical History:  Diagnosis Date   Actinic keratosis 02/14/2011   Allergy    Aneurysm of ascending aorta without rupture 01/06/2022   Aortic stenosis    Arthritis    Bilateral sensorineural hearing loss 06/16/2009   CAD (coronary artery disease)    cabg   Complication of anesthesia    Coronary atherosclerosis of native coronary artery 06/15/2009   Diabetic polyneuropathy 11/11/2018   Dyslipidemia 11/05/2018   Embolic stroke involving left middle cerebral artery  05/30/2021   Erectile dysfunction 05/18/2009   Essential hypertension 06/16/2009   Expressive aphasia 06/25/2021   External hemorrhoids 03/21/2010   Gout 04/09/2015   Heart murmur    Hemochromatosis 02/14/2011   History of aortic valve replacement 10/13/2009   History of CABG 11/05/2018   2005- 1. Coronary artery bypass grafting x4 with LIMA-LAD, SVG-OM1, SVG-AM and dCFX.  2. Aortic valve replacement with [employer] Jude AVR 3. Septal myomectomy.  4. Myoview low risk 2016   History of hepatitis 01/31/1972   Hypertriglyceridemia 06/13/2010   Hypertrophic obstructive cardiomyopathy 05/06/2008   SP septal myomectomy at surgery 2005 Echo Nov 2018- Hyperdynamic LVEF with severe basal septal hypertrophy. There is chordal SAM with resting gradient of 16 mmHg that increases to 85 mmHg with Valsalva        Leg weakness, bilateral 01/18/2021   Long term (current) use of anticoagulants  05/22/2018   Lumbar stenosis with neurogenic claudication 02/04/2021   Malignant neoplasm of prostate 06/17/2009   Mechanical heart valve present    Manufacturer: Deitra Rosemary Blade #: [id number]  Model #: [id number]. Card states MRI compatible with 3 teslas or less.   Obesity, unspecified 04/29/2008   OSA (obstructive sleep apnea) 07/12/2012   moderate-severe; uses CPAP nightly   PONV (postoperative nausea and vomiting)    after CABG- slow to wake up   Presence of prosthetic heart valve    Primary osteoarthritis of left shoulder 10/31/2018   S/P lumbar laminectomy 02/04/2021   Stage 3 chronic kidney disease due to type 2 diabetes mellitus 11/11/2018   Tobacco use 06/16/2009   Type 2 diabetes mellitus with hyperglycemia    Ventral hernia 03/03/2014     Past Surgical History:  Procedure Laterality Date   AORTIC VALVE REPLACEMENT  11/14/2003   [facility]   APPENDECTOMY  1990   CHOLECYSTECTOMY  1990   CORONARY ANGIOGRAPHY  10/11/2021   CORONARY ARTERY BYPASS GRAFT  10/2003   HERNIA REPAIR  1999   right, inguinal   HERNIA REPAIR  2002   left, inguinal   HIP SURGERY  2006   right hip   IR CT HEAD LTD  02/03/2022   IR PERCUTANEOUS ART THROMBECTOMY/INFUSION INTRACRANIAL INC DIAG ANGIO  01/24/2022   LUMBAR LAMINECTOMY/DECOMPRESSION MICRODISCECTOMY Bilateral 02/04/2021   Procedure: Bilateral Lumbar Two-Three Laminectomy;  Surgeon: Colon Shove, MD;  Location: [facility] OR;  Service: Neurosurgery;  Laterality: Bilateral;  3C/RM 20  PILONIDAL CYST EXCISION  1964   prostate seed implant  03/2010   RADIOLOGY WITH ANESTHESIA N/A 01/24/2022   Procedure: IR WITH ANESTHESIA;  Surgeon: Radiologist, Medication, MD;  Location: [facility] OR;  Service: Radiology;  Laterality: N/A;   REVERSE SHOULDER ARTHROPLASTY Left 08/07/2023   Procedure: ARTHROPLASTY, SHOULDER, TOTAL, REVERSE;  Surgeon: Josefina Chew, MD;  Location: [facility];  Service: Orthopedics;  Laterality: Left;   TEE WITHOUT CARDIOVERSION N/A 03/14/2022    Procedure: TRANSESOPHAGEAL ECHOCARDIOGRAM (TEE);  Surgeon: Loni Soyla LABOR, MD;  Location: [facility];  Service: Cardiology;  Laterality: N/A;   TONSILLECTOMY  childhood         Objective:     PHYSICAL EXAMINATION:    VITALS:   Vitals:   02/08/24 1114  BP: 117/71  Pulse: 72  Resp: 20  SpO2: 98%  Weight: 214 lb (97.1 kg)  Height: 5' 8.5 (1.74 m)    GEN:  The patient appears stated age and is in NAD. HEENT:  Normocephalic, atraumatic.   Neurological examination:  General: NAD, well-groomed, appears stated age. Orientation: The patient is alert. Oriented to person, place and to date.  Cranial nerves: There is good facial symmetry.The speech is fluent and clear. No aphasia or dysarthria. Fund of knowledge is appropriate. Recent memory and remote memory is normal.  Attention and concentration are normal.  Able to name objects and repeat phrases.  Hearing is intact to conversational tone with hearing aids .   Delayed recall 3/3 Sensation: Sensation is intact to light touch throughout Motor: Minimal bilateral lower extremity sided weakness.  Strength is at least antigravity x4. DTR's 1/4 in UE/LE      12/28/2020    8:00 AM  Montreal Cognitive Assessment   Visuospatial/ Executive (0/5) 5  Naming (0/3) 3  Attention: Read list of digits (0/2) 2  Attention: Read list of letters (0/1) 0  Attention: Serial 7 subtraction starting at 100 (0/3) 3  Language: Repeat phrase (0/2) 1  Language : Fluency (0/1) 1  Abstraction (0/2) 2  Delayed Recall (0/5) 5  Orientation (0/6) 6  Total 28       09/27/2022   12:00 PM 01/19/2016    2:24 PM  MMSE - Mini Mental State Exam  Orientation to time 5 5   Orientation to Place 5 5   Registration 3 3   Attention/ Calculation 5 5   Recall 3 2   Language- name 2 objects 2 2   Language- repeat 1 1  Language- follow 3 step command 3 3   Language- read & follow direction 1 1   Write a sentence 1 1   Copy design 1 1   Total score 30  29      Data saved with a previous flowsheet row definition      Movement examination: Tone: There is normal tone in the UE/LE Abnormal movements: Minimal right greater than left intention and no resting tremor.  No myoclonus.  No asterixis.   Coordination:  There is no decremation with RAM's. Normal finger to nose  Gait and Station: The patient has mild difficulty arising out of a deep-seated chair without the use of the hands. The patient's stride length is good.  Gait is cautious and narrow.   Thank you for allowing us  the opportunity to participate in the care of this nice patient. Please do not hesitate to contact us  for any questions or concerns.   Total time spent on today's visit was 31 minutes dedicated to this  patient today, preparing to see patient, examining the patient, ordering tests and/or medications and counseling the patient, documenting clinical information in the EHR or other health record, independently interpreting results and communicating results to the patient/family, discussing treatment and goals, answering patient's questions and coordinating care.  Cc:  Daryl Setter, NP  Camie Sevin 02/08/2024 11:55 AM      "

## 2024-02-08 ENCOUNTER — Ambulatory Visit: Admitting: Physician Assistant

## 2024-02-08 ENCOUNTER — Encounter: Payer: Self-pay | Admitting: Family

## 2024-02-08 ENCOUNTER — Encounter: Payer: Self-pay | Admitting: Physician Assistant

## 2024-02-08 VITALS — BP 117/71 | HR 72 | Resp 20 | Ht 68.5 in | Wt 214.0 lb

## 2024-02-08 DIAGNOSIS — R413 Other amnesia: Secondary | ICD-10-CM | POA: Diagnosis not present

## 2024-02-08 DIAGNOSIS — R4189 Other symptoms and signs involving cognitive functions and awareness: Secondary | ICD-10-CM | POA: Insufficient documentation

## 2024-02-08 DIAGNOSIS — E1122 Type 2 diabetes mellitus with diabetic chronic kidney disease: Secondary | ICD-10-CM

## 2024-02-08 NOTE — Patient Instructions (Signed)
 It was a pleasure to see you today at our office.   Recommendations:  Follow up pending on the results of the neurocognitive testing  Repeat neuropsychological evaluation for diagnostic clarity  Continue CPAP Recommend Home PT    Follow up with Ortho and Urology     If you have any severe symptoms of a stroke, or other severe issues such as confusion,severe chills or fever, etc call 911 or go to the ER as you may need to be evaluated further       Feel free to go to the following database for funded clinical studies conducted around the world: rankchecks.se   https://www.triadclinicaltrials.com/     RECOMMENDATIONS FOR ALL PATIENTS WITH MEMORY PROBLEMS: 1. Continue to exercise (Recommend 30 minutes of walking everyday, or 3 hours every week) 2. Increase social interactions - continue going to [place] and enjoy social gatherings with friends and family 3. Eat healthy, avoid fried foods and eat more fruits and vegetables 4. Maintain adequate blood pressure, blood sugar, and blood cholesterol level. Reducing the risk of stroke and cardiovascular disease also helps promoting better memory. 5. Avoid stressful situations. Live a simple life and avoid aggravations. Organize your time and prepare for the next day in anticipation. 6. Sleep well, avoid any interruptions of sleep and avoid any distractions in the bedroom that may interfere with adequate sleep quality 7. Avoid sugar, avoid sweets as there is a strong link between excessive sugar intake, diabetes, and cognitive impairment We discussed the Mediterranean diet, which has been shown to help patients reduce the risk of progressive memory disorders and reduces cardiovascular risk. This includes eating fish, eat fruits and green leafy vegetables, nuts like almonds and hazelnuts, walnuts, and also use olive oil. Avoid fast foods and fried foods as much as possible. Avoid sweets and sugar as sugar use has been linked to worsening  of memory function.  There is always a concern of gradual progression of memory problems. If this is the case, then we may need to adjust level of care according to patient needs. Support, both to the patient and caregiver, should then be put into place.    FALL PRECAUTIONS: Be cautious when walking. Scan the area for obstacles that may increase the risk of trips and falls. When getting up in the mornings, sit up at the edge of the bed for a few minutes before getting out of bed. Consider elevating the bed at the head end to avoid drop of blood pressure when getting up. Walk always in a well-lit room (use night lights in the walls). Avoid area rugs or power cords from appliances in the middle of the walkways. Use a walker or a cane if necessary and consider physical therapy for balance exercise. Get your eyesight checked regularly.  FINANCIAL OVERSIGHT: Supervision, especially oversight when making financial decisions or transactions is also recommended.  HOME SAFETY: Consider the safety of the kitchen when operating appliances like stoves, microwave oven, and blender. Consider having supervision and share cooking responsibilities until no longer able to participate in those. Accidents with firearms and other hazards in the house should be identified and addressed as well.   ABILITY TO BE LEFT ALONE: If patient is unable to contact 911 operator, consider using LifeLine, or when the need is there, arrange for someone to stay with patients. Smoking is a fire hazard, consider supervision or cessation. Risk of wandering should be assessed by caregiver and if detected at any point, supervision and safe proof recommendations should  be instituted.  MEDICATION SUPERVISION: Inability to self-administer medication needs to be constantly addressed. Implement a mechanism to ensure safe administration of the medications.   DRIVING: Regarding driving, in patients with progressive memory problems, driving will be  impaired. We advise to have someone else do the driving if trouble finding directions or if minor accidents are reported. Independent driving assessment is available to determine safety of driving.   If you are interested in the driving assessment, you can contact the following:  [employer] in [place] [phone]  [facility] [phone]  [facility] [phone]  [facility] [phone] or [phone]

## 2024-02-10 NOTE — Telephone Encounter (Signed)
 See mychart. Can you please call patient to schedule?

## 2024-02-11 ENCOUNTER — Ambulatory Visit: Attending: Cardiology

## 2024-02-11 DIAGNOSIS — Z952 Presence of prosthetic heart valve: Secondary | ICD-10-CM

## 2024-02-11 DIAGNOSIS — Z7901 Long term (current) use of anticoagulants: Secondary | ICD-10-CM | POA: Diagnosis not present

## 2024-02-11 LAB — POCT INR: INR: 2.3 (ref 2.0–3.0)

## 2024-02-11 NOTE — Progress Notes (Signed)
"   INR 2.3 Please see anticoagulation encounter Take 1.5 tablets tonight only then Continue taking warfarin 1 tablets daily except 1/2 tablet Monday, Wednesday, and Friday.  Recheck INR in 3 weeks Coumadin  Clinic [phone].  [facility] Fax # [phone] "

## 2024-02-11 NOTE — Patient Instructions (Signed)
 Take 1.5 tablets tonight only then Continue taking warfarin 1 tablets daily except 1/2 tablet Monday, Wednesday, and Friday.  Recheck INR in 3 weeks Coumadin  Clinic [phone].  [facility] Fax # [phone]

## 2024-02-12 MED ORDER — LANTUS SOLOSTAR 100 UNIT/ML ~~LOC~~ SOPN: 22.0000 [IU] | PEN_INJECTOR | Freq: Every day | SUBCUTANEOUS | 5 refills | Status: AC

## 2024-02-13 ENCOUNTER — Other Ambulatory Visit: Payer: Self-pay | Admitting: Cardiology

## 2024-02-13 DIAGNOSIS — Z952 Presence of prosthetic heart valve: Secondary | ICD-10-CM

## 2024-02-13 DIAGNOSIS — I480 Paroxysmal atrial fibrillation: Secondary | ICD-10-CM

## 2024-02-13 NOTE — Telephone Encounter (Addendum)
 Requesting office sent duplicate, see original 01/10/24.   Per form today noted if warfarin hold can be 3-5 days prior and they will need new INR results before dental procedure.   I will send this to preop APP to review.

## 2024-02-13 NOTE — Telephone Encounter (Signed)
 Spoke with dental office regarding holding warfarin. Advised that Lovenox  bridging would be required if warfarin was held, which is not recommended for one tooth extraction. They were agreeable and requested faxing recommendations again.

## 2024-02-14 MED ORDER — AMOXICILLIN 500 MG PO TABS: ORAL_TABLET | ORAL | 1 refills | Status: AC

## 2024-02-14 NOTE — Telephone Encounter (Signed)
 Patient does not need to hold warfarin for 1 simple exaction. He does need SBE prophylaxis. Will send in amoxicillin .

## 2024-02-14 NOTE — Addendum Note (Signed)
 Addended by: Alyvia Derk D on: 02/14/2024 08:00 AM   Modules accepted: Orders

## 2024-02-14 NOTE — Telephone Encounter (Signed)
" ° ° °  Primary Cardiologist: Redell Shallow, MD  Chart reviewed as part of pre-operative protocol coverage. Simple dental extractions are considered low risk procedures per guidelines and generally do not require any specific cardiac clearance. It is also generally accepted that for simple extractions and dental cleanings, there is no need to interrupt blood thinner therapy.   SBE prophylaxis is required for the patient.  Per PharmD: Patient does not need to hold warfarin for 1 simple exaction. He does need SBE prophylaxis. Will send in amoxicillin .  I will route this recommendation to the requesting party via Epic fax function and remove from pre-op pool.  Please call with questions.  Lum LITTIE Louis, NP 02/14/2024, 8:25 AM     "

## 2024-02-20 ENCOUNTER — Encounter: Payer: Self-pay | Admitting: Internal Medicine

## 2024-02-21 ENCOUNTER — Ambulatory Visit (HOSPITAL_COMMUNITY)
Admission: RE | Admit: 2024-02-21 | Discharge: 2024-02-21 | Disposition: A | Discharge status: Home / Self Care | Source: Ambulatory Visit | Attending: Cardiology | Admitting: Cardiology

## 2024-02-21 ENCOUNTER — Encounter: Payer: Self-pay | Admitting: Family

## 2024-02-21 DIAGNOSIS — Z952 Presence of prosthetic heart valve: Secondary | ICD-10-CM | POA: Diagnosis present

## 2024-02-21 DIAGNOSIS — I359 Nonrheumatic aortic valve disorder, unspecified: Secondary | ICD-10-CM

## 2024-02-21 LAB — ECHOCARDIOGRAM COMPLETE
AV Mean grad: 6.2 mmHg
AV Peak grad: 10 mmHg
Ao pk vel: 1.58 m/s
Area-P 1/2: 2.83 cm2
S' Lateral: 2.6 cm

## 2024-02-21 MED ORDER — DEXCOM G7 SENSOR MISC: 3 refills | Status: AC

## 2024-02-21 MED ORDER — PERFLUTREN LIPID MICROSPHERE
1.0000 mL | INTRAVENOUS | Status: AC | PRN
  Administered 2024-02-21: 2 mL via INTRAVENOUS

## 2024-02-22 ENCOUNTER — Ambulatory Visit: Payer: Self-pay | Admitting: Cardiology

## 2024-03-03 ENCOUNTER — Ambulatory Visit

## 2024-03-03 ENCOUNTER — Ambulatory Visit: Payer: Self-pay

## 2024-03-04 ENCOUNTER — Encounter: Payer: Self-pay | Admitting: Psychology

## 2024-03-04 ENCOUNTER — Ambulatory Visit

## 2024-03-04 DIAGNOSIS — Z7901 Long term (current) use of anticoagulants: Secondary | ICD-10-CM

## 2024-03-04 DIAGNOSIS — Z952 Presence of prosthetic heart valve: Secondary | ICD-10-CM

## 2024-03-04 LAB — POCT INR: INR: 2.5 (ref 2.0–3.0)

## 2024-03-05 ENCOUNTER — Encounter: Payer: Self-pay | Admitting: Psychology

## 2024-03-05 ENCOUNTER — Ambulatory Visit: Payer: Self-pay

## 2024-03-05 ENCOUNTER — Ambulatory Visit: Payer: Self-pay | Admitting: Psychology

## 2024-03-05 DIAGNOSIS — F4322 Adjustment disorder with anxiety: Secondary | ICD-10-CM | POA: Diagnosis not present

## 2024-03-05 DIAGNOSIS — R4189 Other symptoms and signs involving cognitive functions and awareness: Secondary | ICD-10-CM | POA: Diagnosis not present

## 2024-03-05 NOTE — Progress Notes (Signed)
" ° °  Psychometrician Note   Cognitive testing was administered to Quantel E Sula by Evalene Pizza, B.S. (psychometrist) under the supervision of Dr. Arthea KYM Maryland, Ph.D., ABPP, licensed psychologist on 03/05/2024. Mr. Raisch did not appear overtly distressed by the testing session per behavioral observation or responses across self-report questionnaires. Rest breaks were offered.   The battery of tests administered was selected by Dr. Zachary C. Merz, Ph.D., ABPP with consideration to Mr. Mangen's current level of functioning, the nature of his symptoms, emotional and behavioral responses during interview, level of literacy, observed level of motivation/effort, and the nature of the referral question. This battery was communicated to the psychometrist. Communication between Dr. Arthea KYM Maryland, Ph.D., ABPP and the psychometrist was ongoing throughout the evaluation and Dr. Arthea KYM Maryland, Ph.D., ABPP was immediately accessible at all times. Dr. Zachary C. Merz, Ph.D., ABPP provided supervision to the psychometrist on the date of this service to the extent necessary to assure the quality of all services provided.    Claudius E Klare will return within approximately 1-2 weeks for an interactive feedback session with Dr. Maryland at which time his test performances, clinical impressions, and treatment recommendations will be reviewed in detail. Mr. Oldenburg understands he can contact our office should he require our assistance before this time.  A total of 187 minutes of billable time were spent face-to-face with Mr. Couillard by the psychometrist. This includes both test administration and scoring time. Billing for these services is reflected in the clinical report generated by Dr. Arthea KYM Maryland, Ph.D., ABPP  This note reflects time spent with the psychometrician and does not include test scores or any clinical interpretations made by Dr. Maryland. The full report will follow in a separate note. "

## 2024-03-05 NOTE — Progress Notes (Signed)
 "   NEUROPSYCHOLOGICAL EVALUATION [name]. [facility] [place] Department of Neurology  Date of Evaluation: March 05, 2024   Reason for Referral:   [name] is a 79 y.o. right-handed Caucasian male referred by Camie Sevin, PA-C, to characterize his current cognitive functioning and assist with diagnostic clarity and treatment planning in the context of subjective cognitive decline.   Assessment and Plan:   Clinical Impression(s): Results across neurocognitive testing are interpreted by comparing an individual's performances to those of a neurologically healthy age- and often education-matched peer group. This implies that areas of impairment, when present, represent dysfunction beyond the typical aging process. With this understanding, [name]'s pattern of performance is suggestive of neuropsychological functioning within normal limits. All assessed cognitive domains were normatively appropriate. This includes processing speed, attention/concentration, executive functioning, receptive and expressive language, visuospatial abilities, and all aspects of learning and memory. Despite this, subtle variability could be argued across:  Processing Speed. Processing speed is a cognitive domain that refers to how quickly and efficiently an individual can perceive information, perform simple mental operations, and produce a response. It reflects the pace at which the brain processes routine or well-learned tasks, especially under time pressure.  Expressive Language. Expressive language is a cognitive domain that refers to the ability to communicate thoughts, ideas, and emotions through spoken, written, or signed language. It involves using vocabulary, grammar, and sentence structure to convey meaning clearly and effectively. Specifically, [name] exhibited subtle variability surrounding semantic fluency (i.e., an aspect of rapid word generation).  Relative to his previous  evaluation in July 2023, [name] exhibited a very large degree of stability. No cognitive domain exhibited noteworthy decline, with there actually being more evidence to suggest subtle improvement over time. Functionally, [name] largely denied difficulties completing instrumental activities of daily living (ADLs) independently. He does not warrant consideration of a neurocognitive disorder presently.  Subtle variability surrounding processing speed and semantic fluency is certainly a reasonable finding given his stroke history, especially his left MCA region infarct dating back to 2023. This could be exacerbated by mood concerns, likely at least partially related to variable adjustment to physical limitations and subtle cognitive changes stemming from his numerous strokes over time. As his 2023 evaluation was performed only 2 months following his left MCA infarct event, it was noted then that some improvement could be seen over time. Areas of subtle improvement across the current testing would reflect this natural healing process, as could the consistent use of hearing aids. Memory performances continue to be quite strong and there is no evidence for objective memory impairment or progressive decline over time. Testing continues to raise no concerns for symptomatic Alzheimer's disease or another neurodegenerative illness.  Recommendations: A combination of medication and psychotherapy has been shown to be most effective at treating mood concerns and personality change/behavioral regulation concerns. As such, [name] is encouraged to speak with his prescribing physician regarding medication adjustments to optimally manage these symptoms. Likewise, [name] is encouraged to consider engaging in short-term psychotherapy to address symptoms of psychiatric distress. He would benefit from an active and collaborative therapeutic environment, rather than one purely supportive in nature. Recommended  treatment modalities include Cognitive Behavioral Therapy (CBT) or Acceptance and Commitment Therapy (ACT).  Performance across neurocognitive testing is not a strong predictor of an individual's safety operating a motor vehicle. Should his family wish to pursue a formalized driving evaluation, they could reach out to the following agencies: [employer] in  : [phone] [facility]: [phone] [facility]: [phone] [facility]: [phone] or [phone]  [name] is encouraged to attend to lifestyle factors for brain health (e.g., regular physical exercise, good nutrition habits and consideration of the MIND-DASH diet, regular participation in cognitively-stimulating activities, and general stress management techniques), which are likely to have benefits for both emotional adjustment and cognition. In fact, in addition to promoting good general health, regular exercise incorporating aerobic activities (e.g., brisk walking, jogging, cycling, etc.) has been demonstrated to be a very effective treatment for depression and stress, with similar efficacy rates to both antidepressant medication and psychotherapy. Optimal control of vascular risk factors (including safe cardiovascular exercise and adherence to dietary recommendations) is encouraged. Continued participation in activities which provide mental stimulation and social interaction is also recommended.   If interested, there are some activities which have therapeutic value and can be useful in keeping him cognitively stimulated. For suggestions, [name] is encouraged to go to the following website: https://www.[facility].org/get-to-know-barrow/centers-programs/neurorehabilitation-center/neuro-rehab-apps-and-games/ which has smart phone/tablet based options. It should be noted that these activities should not be viewed as a substitute for therapy.  Memory can be improved using  internal strategies such as rehearsal, repetition, chunking, mnemonics, association, and imagery. External strategies such as written notes in a consistently used memory journal, visual and nonverbal auditory cues such as a calendar on the refrigerator or appointments with alarm, such as on a cell phone, can also help maximize recall.    To address problems with processing speed, he may wish to consider:   -Ensuring that he is alerted when essential material or instructions are being presented   -Adjusting the speed at which new information is presented   -Allowing for more time in comprehending, processing, and responding in conversation   -Repeating and paraphrasing instructions or conversations aloud  To address problems with fluctuating attention and/or executive dysfunction, he may wish to consider:   -Avoiding external distractions when needing to concentrate   -Limiting exposure to fast paced environments with multiple sensory demands   -Writing down complicated information and using checklists   -Attempting and completing one task at a time (i.e., no multi-tasking)   -Verbalizing aloud each step of a task to maintain focus   -Taking frequent breaks during the completion of steps/tasks to avoid fatigue   -Reducing the amount of information considered at one time   -Scheduling more difficult activities for a time of day where he is usually most alert  Review of Records:   Mr. Valdez completed a comprehensive neuropsychological evaluation with myself on 08/26/2021. Results suggested neuropsychological functioning largely within normal limits relative to age-matched peers. He exhibited mild performance variability across executive functioning and semantic fluency. Specific to the latter, all other assessments of expressive language were strong. Performances were likewise appropriate across processing speed, attention/concentration, safety/judgment, receptive language, visuospatial abilities,  and all aspects of learning and memory. Given Mr. Carby's handedness and the location of his May 2023 infarct (left MCA with frontal cortex involvement), some weaknesses in executive functioning and expressive language would be an expected finding. Current scores reflect an expected pattern of weakness and are likely directly related to this event.  Past Medical History:  Diagnosis Date   Actinic keratosis 02/14/2011   Allergy    Aneurysm of ascending aorta without rupture 01/06/2022   Aortic stenosis    Arthritis    Blood in urine 02/20/2022   CAD (coronary artery disease)    cabg   Chronic anticoagulation 08/12/2022  Complication of anesthesia    Coronary atherosclerosis of native coronary artery 06/15/2009   Diabetic polyneuropathy 11/11/2018   Dyslipidemia 11/05/2018   Embolic stroke involving left middle cerebral artery  06/25/2021   Small to moderate left MCA branch infarct affecting left insular and frontal cortex   Erectile dysfunction 05/18/2009   Essential hypertension 06/16/2009   External hemorrhoids 03/21/2010   Gout 04/09/2015   Heart murmur    Hemochromatosis 02/14/2011   History of aortic valve replacement 10/13/2009   History of CABG 11/05/2018   2005- 1. Coronary artery bypass grafting x4 with LIMA-LAD, SVG-OM1, SVG-AM and dCFX.  2. Aortic valve replacement with [facility] AVR 3. Septal myomectomy.  4. Myoview low risk 2016   History of falling 01/30/2021   History of hepatitis 01/31/1972   Hypertriglyceridemia 06/13/2010   Hypertrophic obstructive cardiomyopathy 05/06/2008   SP septal myomectomy at surgery 2005 Echo Nov 2018- Hyperdynamic LVEF with severe basal septal hypertrophy. There is chordal SAM with resting gradient of 16 mmHg that increases to 85 mmHg with Valsalva        Hypotension 04/27/2022   Ischemic stroke 04/09/2022   tiny acute cortical infarct of the left frontal operculum, within an area of chronic Left MCA middle division infarct with  encephalomalacia and laminar necrosis   Leg weakness, bilateral 01/18/2021   Long term (current) use of anticoagulants 05/22/2018   Lumbar stenosis with neurogenic claudication 02/04/2021   Malignant neoplasm of prostate 06/17/2009   Mechanical heart valve present    Manufacturer: Deitra Rosemary Blade #: [id number]  Model #: [id number]. Card states MRI compatible with 3 teslas or less.   Middle cerebral artery embolism, left 01/24/2022   NSTEMI (non-ST elevated myocardial infarction) 10/09/2021   Last Assessment & Plan:   Formatting of this note might be different from the original.  NSTEMI: High risk features include: age > 12, history of CAD, recent ASA use, positive cardiac biomarkers and 3 or more cardiac risk factors.     Plan:  - Nasal cannula O2  - Serial cardiac enzymes  - Chest X-ray  - Echocardiogram  - Treatment with beta blockers, nitroglycerin  and statin therapy  - Beta blocke   Obesity, unspecified 04/29/2008   OSA (obstructive sleep apnea) 07/12/2012   moderate-severe; uses CPAP nightly   Paroxysmal atrial fibrillation 10/09/2021   Last Assessment & Plan:   Formatting of this note might be different from the original.  Currently in sinus rhythm   Continue beta-blocker, resume warfarin with bridging with Lovenox      PONV (postoperative nausea and vomiting)    after CABG- slow to wake up   Presence of prosthetic heart valve    Primary osteoarthritis, left shoulder 10/31/2018   S/P lumbar laminectomy 02/04/2021   S/P reverse total shoulder arthroplasty, left 08/07/2023   Sensorineural hearing loss (SNHL) of right ear with restricted hearing of left ear 06/21/2020   Stage 3 chronic kidney disease due to type 2 diabetes mellitus 11/11/2018   Tobacco use 06/16/2009   Type 2 diabetes mellitus with hyperglycemia    Urinary retention 01/26/2022   Ventral hernia 03/03/2014    Past Surgical History:  Procedure Laterality Date   AORTIC VALVE REPLACEMENT  11/14/2003   [facility]    APPENDECTOMY  1990   CHOLECYSTECTOMY  1990   CORONARY ANGIOGRAPHY  10/11/2021   CORONARY ARTERY BYPASS GRAFT  10/2003   HERNIA REPAIR  1999   right, inguinal   HERNIA REPAIR  2002  left, inguinal   HIP SURGERY  2006   right hip   IR CT HEAD LTD  02/03/2022   IR PERCUTANEOUS ART THROMBECTOMY/INFUSION INTRACRANIAL INC DIAG ANGIO  01/24/2022   LUMBAR LAMINECTOMY/DECOMPRESSION MICRODISCECTOMY Bilateral 02/04/2021   Procedure: Bilateral Lumbar Two-Three Laminectomy;  Surgeon: Colon Shove, MD;  Location[facility] OR;  Service: Neurosurgery;  Laterality: Bilateral;  3C/RM 20   PILONIDAL CYST EXCISION  1964   prostate seed implant  03/2010   RADIOLOGY WITH ANESTHESIA N/A 01/24/2022   Procedure: IR WITH ANESTHESIA;  Surgeon: Radiologist, Medication, MD;  Location: [facility] OR;  Service: Radiology;  Laterality: N/A;   REVERSE SHOULDER ARTHROPLASTY Left 08/07/2023   Procedure: ARTHROPLASTY, SHOULDER, TOTAL, REVERSE;  Surgeon: Josefina Chew, MD;  Location: [facility] ORS;  Service: Orthopedics;  Laterality: Left;   TEE WITHOUT CARDIOVERSION N/A 03/14/2022   Procedure: TRANSESOPHAGEAL ECHOCARDIOGRAM (TEE);  Surgeon: Loni Soyla LABOR, MD;  Location: [facility] ENDOSCOPY;  Service: Cardiology;  Laterality: N/A;   TONSILLECTOMY  childhood    Current Outpatient Medications  Medication Instructions   AMBULATORY NON FORMULARY MEDICATION cpap cushions            AirFit F20 (Size: Large)           AirFit F30 (Size: Med)   Please FAX the prescription to:  [phone]   amoxicillin  (AMOXIL ) 500 MG tablet Take 4 tablets (2000mg ) by mouth 30-60 min prior to dental appointment.   aspirin  EC 81 mg, Oral, Daily, Swallow whole.   BD PEN NEEDLE NANO 2ND GEN 32G X 4 MM MISC 1 DEVICE BY DOES NOT APPLY ROUTE IN THE MORNING, AT NOON, IN THE EVENING, AND AT BEDTIME.   Blood Glucose Monitoring Suppl Supplies MISC Use for monitoring glucose level   colchicine  0.6 MG tablet Take 2 tabs (1.2 mg) by mouth once, then 1 tab (0.6 mg)  one hour  later. May repeat tomorrow if symptoms do not improve.   Continuous Glucose Receiver (DEXCOM G7 RECEIVER) DEVI 1 Device, Does not apply, Continuous   Continuous Glucose Sensor (DEXCOM G7 SENSOR) MISC Change every 10 days   Farxiga  10 mg, Oral, Daily before breakfast   fenofibrate  (TRICOR ) 145 mg, Oral, Daily   furosemide  (LASIX ) 40 mg, Oral, Daily   glucose blood (ONETOUCH VERIO) test strip Use as instructed   insulin  aspart (NOVOLOG  FLEXPEN) 100 UNIT/ML FlexPen MAX DAILY 75 UNITS   isosorbide  mononitrate (IMDUR ) 30 mg, Oral, Daily   ketoconazole (NIZORAL) 2 % cream 1 Application, Daily PRN   Lantus  SoloStar 22 Units, Subcutaneous, Daily   methocarbamol  (ROBAXIN ) 500 mg, Oral, Every 8 hours PRN   metoprolol  succinate (TOPROL -XL) 25 mg, Oral, Daily   ondansetron  (ZOFRAN ) 4 mg, Oral, Every 8 hours PRN   OneTouch Delica Lancets 33G MISC USE AS DIRECTED   oxyCODONE  (ROXICODONE ) 5 mg, Oral, Every 4 hours PRN   predniSONE  (DELTASONE ) 30 mg, Oral, Daily with breakfast   PRESCRIPTION MEDICATION CPAP- At bedtime   rosuvastatin  (CRESTOR ) 40 mg, Oral, Daily   Semaglutide , 2 MG/DOSE, (OZEMPIC , 2 MG/DOSE,) 8 MG/3ML SOPN INJECT 2 MG AS DIRECTED ONCE A WEEK.   sennosides-docusate sodium  (SENOKOT-S) 8.6-50 MG tablet 2 tablets, Oral, Daily   tacrolimus  (PROTOPIC ) 0.1 % ointment 1 application , 2 times daily PRN   warfarin (COUMADIN ) 7.5 MG tablet TAKE 1/2 TABLET TO 1 TABLET BY MOUTH DAILY AS DIRECTED BY THE COUMADIN  CLINIC      02/08/2024    1:00 PM 09/27/2022   12:00 PM 01/19/2016    2:24 PM  MMSE - Mini Mental State Exam  Orientation to time 5 5 5    Orientation to Place 5 5 5    Registration 3 3 3    Attention/ Calculation 5 5 5    Recall 3 3 2    Language- name 2 objects 2 2 2    Language- repeat 1 1 1   Language- follow 3 step command 3 3 3    Language- read & follow direction 1 1 1    Write a sentence 1 1 1    Copy design 1 1 1    Total score 30 30 29        12/28/2020    8:00 AM  [facility]  Cognitive Assessment   Visuospatial/ Executive (0/5) 5  Naming (0/3) 3  Attention: Read list of digits (0/2) 2  Attention: Read list of letters (0/1) 0  Attention: Serial 7 subtraction starting at 100 (0/3) 3  Language: Repeat phrase (0/2) 1  Language : Fluency (0/1) 1  Abstraction (0/2) 2  Delayed Recall (0/5) 5  Orientation (0/6) 6  Total 28   Neuroimaging: Head CT on 12/27/2020 revealed a small chronic infarct within the left cerebellar hemisphere, as well as mild generalized atrophy.   Brain MRI on 06/25/2021 revealed a small to moderate left MCA branch infarction involving the left insular and frontal cortices, as well as mild generalized atrophy and mild microvascular ischemic disease.   Brain MRI on 01/25/2022 revealed mild restricted diffusion in the white matter tracts of the left basal ganglia and caudate, compatible with an acute infarct, additional punctate acute infarct in the right perirolandic posterior frontal lobe, and a small area of acute infarct versus artifact within the more anterior right frontal lobe white matter.  Brain MRI on 04/09/2022 revealed a tiny acute cortical infarct of the left frontal operculum, within an area of chronic Left MCA middle division infarct with encephalomalacia and laminar necrosis, as well as increased intrinsic T1 signal in the left lentiform nuclei since December, favoring a form of Wallerian degeneration secondary to the chronic ischemia.  Clinical Interview:   The following information was obtained during a clinical interview with Mr. Reichelt and his wife prior to cognitive testing.  Cognitive Symptoms: Decreased short-term memory: Endorsed. Prior to his May 2023 left MCA infarct, his wife reported concerns surrounding Mr. Shaff asking repetitive questions and having trouble recalling details of past conversations. However, she acknowledged that she was unclear if this was related to hearing loss or true memory impairment at that time.  Mr. Hollinsworth and his wife acknowledged a modest decline in memory functioning in the acute stages of that infarct, followed by recovery. Presently, both he and his wife expressed concern for generalized forgetfulness, including repetition in day-to-day conversation, trouble recalling recently provided information, and entering rooms and forgetting his original intention. This was said to have subjectively worsened relative to his previous July 2023 evaluation.  Decreased long-term memory: Denied. Decreased attention/concentration: Denied. Reduced processing speed: Denied. Difficulties with executive functions: Denied. He did allude to the possibility of trouble with multi-tasking. However, this was also said to be somewhat longstanding in nature.  Difficulties with emotion regulation: Denied. Difficulties with receptive language: Denied. Difficulties with word finding: Endorsed. In the acute aftermath of his May 2023 infarct, he did exhibit expressive aphasia. Presently, he reported the ongoing presence of word finding difficulties but is generally able to converse with others without significant difficulty.  Decreased visuoperceptual ability: Denied.  Difficulties completing ADLs: Largely denied. He is largely independent with medication management. Most bills are  on auto-draft and he and his wife co-manage financial responsibilities. His wife performs the majority of driving. However, this is due to neuropathy in his right foot creating diminished confidence in his driving ability rather than diminished driving due directly to cognitive concerns.   Additional Medical History: History of traumatic brain injury/concussion: Denied. History of stroke: See neuroimaging above. History of seizure activity: Denied. History of known exposure to toxins: Denied. Symptoms of chronic pain: Denied. Experience of frequent headaches/migraines: Denied. Frequent instances of dizziness/vertigo: Denied.  Sensory  changes: He wears glasses and utilizes hearing aids with benefit. Other sensory changes/difficulties (e.g., taste or smell) were denied.  Balance/coordination difficulties: He acknowledged some generalized balance concerns, noting that he ambulates slowly and in a more cautious manner. Difficulties were attributed to his stroke history and neuropathy. He denied any recent falls.  Other motor difficulties: Endorsed. He previously reported mild tremors, generally while performing fine motor tasks (e.g., holding an eating utensil). These have persisted over time.   Sleep History: Estimated hours obtained each night: 7 hours.  Difficulties falling asleep: Denied. Difficulties staying asleep: Denied. Feels rested and refreshed upon awakening: Endorsed.   History of snoring: Endorsed. History of waking up gasping for air: Endorsed. Witnessed breath cessation while asleep: Endorsed. He has a history of moderate to severe sleep apnea and reported using his CPAP machine nightly.    History of vivid dreaming: Denied. Excessive movement while asleep: Denied. Instances of acting out his dreams: Denied.  Psychiatric/Behavioral Health History: Depression: He described his current mood as good. His wife previously reported increased irritability. This was again emphasized presently as she noted Mr. Caissie being easily angered and very short-tempered, more so than at the time of his 2023 evaluation. He acknowledged this, noting that this is largely frustration based. He denied to his knowledge any previous mental health concerns or formal diagnoses. Current or remote suicidal ideation, intent, or plan was denied. Anxiety: Denied. Mania: Denied. Trauma History: Denied. Visual/auditory hallucinations: Denied. Delusional thoughts: Denied.   Tobacco: Denied. Alcohol: He denied current alcohol consumption as well as a history of problematic alcohol abuse or dependence.  Recreational drugs: Denied.  Family  History: Problem Relation Age of Onset   Heart disease Mother    Stroke Mother    Heart disease Father    Asperger's syndrome Son    Hyperlipidemia Son    Coronary artery disease Brother    Cancer Neg Hx        negative for colon cancer   This information was confirmed by Mr. Pehl.  Academic/Vocational History: Highest level of educational attainment: 18 years. 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 In earlier academic settings, he described himself as lazy and was largely a high C student. No relative weaknesses were identified.  History of developmental delay: Denied. History of grade repetition: Denied. Enrollment in special education courses: Denied. History of LD/ADHD: Denied.   Employment: Retired. 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   Evaluation Results:   Behavioral Observations: Mr. Shaft was accompanied by his wife, arrived to his appointment on time, and was appropriately dressed and groomed. He appeared alert. Observed gait and station were within normal limits. Gross motor functioning appeared intact upon informal observation. Tremors were only observed when he was attempting to hold a particular object. His affect was generally relaxed and positive. Spontaneous speech was fluent and word  finding difficulties were not observed during the clinical interview. Thought processes were coherent, organized, and normal in content. Insight into his cognitive difficulties appeared adequate.   During testing, sustained attention was appropriate. Task engagement was adequate and he persisted when challenged. Overall, Mr. Mamaril was cooperative with the clinical interview and subsequent testing procedures.   Adequacy of Effort: The validity of neuropsychological testing is limited by the extent to which  the individual being tested may be assumed to have exerted adequate effort during testing. Mr. Wicke expressed his intention to perform to the best of his abilities and exhibited adequate task engagement and persistence. Scores across stand-alone and embedded performance validity measures were within expectation. As such, the results of the current evaluation are believed to be a valid representation of Mr. Robbins current cognitive functioning.  Test Results: Mr. Sampath was fully oriented at the time of the current evaluation.  Intellectual abilities based upon educational and vocational attainment were estimated to be in the average to above average range. Premorbid abilities were estimated to be within the well above average range based upon a single-word reading test.   Processing speed was mildly variable but overall appropriate, ranging from the below average to well above average normative ranges. Basic attention was well above average. More complex attention (e.g., working memory) was average to well above average. Executive functioning was average to exceptionally high. He performed in the above average range across a task assessing safety and judgment.  Assessed receptive language abilities were average to above average. Likewise, Mr. Sulak did not exhibit any difficulties comprehending task instructions and answered all questions asked of him appropriately. Assessed expressive language (e.g., verbal fluency and confrontation naming) was mildly variable but overall appropriate, ranging from the below average to above average normative ranges.     Assessed visuospatial/visuoconstructional abilities were average to well above average.    Learning (i.e., encoding) of novel verbal information was above average to well above average. Spontaneous delayed recall (i.e., retrieval) of previously learned information was also above average to well above average. Retention rates were 100% across a list  learning task, 100% across a story learning task, and 90% across a figure drawing task. Performances across recognition tasks were appropriate, suggesting evidence for information consolidation.   Results of emotional screening instruments suggested that recent symptoms of generalized anxiety were in the minimal range, while symptoms of depression were within normal limits. A screening instrument assessing recent sleep quality suggested the presence of minimal sleep dysfunction.  Table of Scores:   Note: This summary of test scores accompanies the interpretive report and should not be considered in isolation without reference to the appropriate sections in the text. Descriptors are based on appropriate normative data and may be adjusted based on clinical judgment. Terms such as Within Normal Limits and Outside Normal Limits are used when a more specific description of the test score cannot be determined. Descriptors refer to the current evaluation only.        Percentile - Normative Descriptor > 98 - Exceptionally High 91-97 - Well Above Average 75-90 - Above Average 25-74 - Average 9-24 - Below Average 2-8 - Well Below Average < 2 - Exceptionally Low        Validity: July 2023 Current  DESCRIPTOR        DCT: --- --- --- Within Normal Limits  RBANS EI: --- --- --- Within Normal Limits  WAIS-IV RDS: --- --- --- Within Normal Limits  Orientation:       Raw Score Raw Score Percentile   NAB Orientation, Form 1 28/29 29/29 --- ---        Cognitive Screening:       Raw Score Raw Score Percentile   SLUMS: 27/30 27/30 --- ---        RBANS, Form A: Standard Score/ Scaled Score Standard Score/ Scaled Score Percentile   Total Score 113 135 99 Exceptionally High  Immediate Memory 112 123 94 Well Above Average    List Learning 11 13 84 Above Average    Story Memory 13 15 95 Well Above Average  Visuospatial/Constructional 126 131 98 Exceptionally High    Figure Copy 14 14 91 Well  Above Average    Line Orientation 19/20 20/20 >75 Above Average  Language 88 92 30 Average    Picture Naming 10/10 9/10 26-50 Average    Semantic Fluency 5 7 16  Below Average  Attention 112 132 98 Exceptionally High    Digit Span 12 15 95 Well Above Average    Coding 12 15 95 Well Above Average  Delayed Memory 110 130 98 Exceptionally High    List Recall 6/10 10/10 >75 Above Average    List Recognition 20/20 20/20 51-75 Average    Story Recall 12 15 95 Well Above Average    Story Recognition 12/12 12/12 69+ Average    Figure Recall 11 14 91 Well Above Average    Figure Recognition 8/8 8/8 70+ Average        Intellectual Functioning:       Standard Score Standard Score Percentile   Test of Premorbid Functioning: 111 123 94 Well Above Average        Attention/Executive Function:      Trail Making Test (TMT): Raw Score (T Score) Raw Score (T Score) Percentile     Part A 42 secs.,  1 error (43) 49 secs.,  0 errors (39) 14 Below Average    Part B 103 secs.,  2 errors (47) 92 secs.,  1 error (47) 38 Average          Scaled Score Scaled Score Percentile   WAIS-IV Digit Span: 15 13 84 Above Average    Forward 15 15 95 Well Above Average    Backward 17 9 37 Average    Sequencing 11 14 91 Well Above Average         Scaled Score Scaled Score Percentile   WAIS-IV Similarities: 12 18 >99 Exceptionally High        D-KEFS Color-Word Interference Test: Raw Score (Scaled Score) Raw Score (Scaled Score) Percentile     Color Naming 40 secs. (7) 37 secs. (9) 37 Average    Word Reading 32 secs. (7) 25 secs. (10) 50 Average    Inhibition 105 secs. (5) 65 secs. (11) 63 Average      Total Errors 4 errors (9) 4 errors (9) 37 Average    Inhibition/Switching 65 secs. (12) 53 secs. (14) 91 Well Above Average      Total Errors 0 errors (13) 1 error (12) 75 Above Average        D-KEFS Verbal Fluency Test: Raw Score (Scaled Score) Raw Score (Scaled Score) Percentile     Letter Total Correct 37  (11) 40 (12) 75 Above Average    Category Total Correct 44 (14) 40 (13) 84 Above Average    Category Switching Total Correct 8 (5) 10 (8) 25 Average    Category Switching Accuracy  7 (6) 9 (8) 25 Average      Total Set Loss Errors 0 (13) 0 (13) 84 Above Average      Total Repetition Errors 1 (12) 2 (11) 63 Average        NAB Executive Functions Module, Form 1: T Score T Score Percentile     Judgment 49 58 79 Above Average        Language:      Verbal Fluency Test: Raw Score (T Score) Raw Score (T Score) Percentile     Phonemic Fluency (FAS) 37 (43) 40 (47) 38 Average    Animal Fluency 24 (59) 20 (50) 50 Average         NAB Language Module, Form 1: T Score T Score Percentile     Oral Production 50 49 46 Average    Auditory Comprehension 57 57 75 Above Average    Reading Comprehension 13/13 13/13 --- Within Normal Limits    Naming 31/31 (59) 31/31 (59) 82 Above Average    Writing 56 56 73 Average        Visuospatial/Visuoconstruction:       Raw Score Raw Score Percentile   Clock Drawing: 10/10 10/10 --- Within Normal Limits         Scaled Score Scaled Score Percentile   WAIS-IV Block Design: 14 13 84 Above Average        Mood and Personality:       Raw Score Raw Score Percentile   Geriatric Depression Scale: --- 5 --- Within Normal Limits  Geriatric Anxiety Scale: --- 8 --- Minimal    Somatic --- 2 --- Minimal    Cognitive --- 3 --- Mild    Affective --- 3 --- Minimal        Additional Questionnaires:       Raw Score Raw Score Percentile   PROMIS Sleep Disturbance Questionnaire: 16 19 --- None to Slight   Informed Consent and Coding/Compliance:   The current evaluation represents a clinical evaluation for the purposes previously outlined by the referral source and is in no way reflective of a forensic evaluation.   Mr. Habeeb was provided with a verbal description of the nature and purpose of the present neuropsychological evaluation. Also reviewed were the foreseeable  risks and/or discomforts and benefits of the procedure, limits of confidentiality, and mandatory reporting requirements of this provider. The patient was given the opportunity to ask questions and receive answers about the evaluation. Oral consent to participate was provided by the patient.   This evaluation was conducted by Arthea KYM Maryland, Ph.D., ABPP-CN, board certified clinical neuropsychologist. Mr. Veasey completed a clinical interview with Dr. Maryland, billed as one unit [phone], and 187 minutes of cognitive testing and scoring, billed as one unit [phone] and five additional units [id number]. Psychometrist Evalene Pizza, B.S. assisted Dr. Maryland with test administration and scoring procedures. As a separate and discrete service, one unit [phone] and two units 96133 (163 minutes) were billed for Dr. Loralee time spent in interpretation and report writing.      "

## 2024-03-12 ENCOUNTER — Ambulatory Visit: Admitting: Family

## 2024-03-18 ENCOUNTER — Encounter: Payer: Self-pay | Admitting: Psychology

## 2024-03-26 ENCOUNTER — Ambulatory Visit

## 2024-05-08 ENCOUNTER — Ambulatory Visit: Admitting: Internal Medicine
